# Patient Record
Sex: Female | Born: 1950 | Race: White | Hispanic: No | Marital: Married | Smoking: Never smoker
Health system: Southern US, Community
[De-identification: ages and names within clinical notes are randomized; demographics above are authoritative.]

## PROBLEM LIST (undated history)

## (undated) DIAGNOSIS — E119 Type 2 diabetes mellitus without complications: Secondary | ICD-10-CM

## (undated) DIAGNOSIS — N390 Urinary tract infection, site not specified: Secondary | ICD-10-CM

## (undated) DIAGNOSIS — Z9289 Personal history of other medical treatment: Secondary | ICD-10-CM

## (undated) DIAGNOSIS — I4891 Unspecified atrial fibrillation: Secondary | ICD-10-CM

## (undated) DIAGNOSIS — I509 Heart failure, unspecified: Secondary | ICD-10-CM

## (undated) DIAGNOSIS — I10 Essential (primary) hypertension: Secondary | ICD-10-CM

## (undated) DIAGNOSIS — M810 Age-related osteoporosis without current pathological fracture: Secondary | ICD-10-CM

## (undated) DIAGNOSIS — R32 Unspecified urinary incontinence: Secondary | ICD-10-CM

## (undated) DIAGNOSIS — Z993 Dependence on wheelchair: Secondary | ICD-10-CM

## (undated) DIAGNOSIS — F329 Major depressive disorder, single episode, unspecified: Secondary | ICD-10-CM

## (undated) DIAGNOSIS — F32A Depression, unspecified: Secondary | ICD-10-CM

## (undated) HISTORY — PX: WRIST FRACTURE SURGERY: SHX121

## (undated) HISTORY — DX: Urinary tract infection, site not specified: N39.0

## (undated) HISTORY — PX: CHOLECYSTECTOMY: SHX55

## (undated) HISTORY — PX: VEIN LIGATION AND STRIPPING: SHX2653

## (undated) HISTORY — PX: ABDOMINAL HYSTERECTOMY: SHX81

---
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
## 2007-09-14 ENCOUNTER — Emergency Department (HOSPITAL_COMMUNITY): Admission: EM | Admit: 2007-09-14 | Discharge: 2007-09-14 | Payer: Self-pay | Admitting: Emergency Medicine

## 2007-10-26 ENCOUNTER — Ambulatory Visit (HOSPITAL_COMMUNITY): Admission: RE | Admit: 2007-10-26 | Discharge: 2007-10-26 | Payer: Self-pay | Admitting: Orthopaedic Surgery

## 2007-11-09 ENCOUNTER — Ambulatory Visit (HOSPITAL_COMMUNITY): Admission: RE | Admit: 2007-11-09 | Discharge: 2007-11-11 | Payer: Self-pay | Admitting: Orthopaedic Surgery

## 2007-12-02 ENCOUNTER — Observation Stay (HOSPITAL_COMMUNITY): Admission: RE | Admit: 2007-12-02 | Discharge: 2007-12-03 | Payer: Self-pay | Admitting: Orthopaedic Surgery

## 2008-11-03 ENCOUNTER — Emergency Department (HOSPITAL_COMMUNITY): Admission: EM | Admit: 2008-11-03 | Discharge: 2008-11-03 | Payer: Self-pay | Admitting: Emergency Medicine

## 2008-11-04 ENCOUNTER — Emergency Department (HOSPITAL_COMMUNITY): Admission: EM | Admit: 2008-11-04 | Discharge: 2008-11-04 | Payer: Self-pay | Admitting: Family Medicine

## 2008-12-24 ENCOUNTER — Emergency Department (HOSPITAL_COMMUNITY): Admission: EM | Admit: 2008-12-24 | Discharge: 2008-12-24 | Payer: Self-pay | Admitting: Emergency Medicine

## 2008-12-25 ENCOUNTER — Encounter: Admission: RE | Admit: 2008-12-25 | Discharge: 2008-12-25 | Payer: Self-pay | Admitting: Orthopedic Surgery

## 2009-05-25 ENCOUNTER — Encounter: Admission: RE | Admit: 2009-05-25 | Discharge: 2009-05-25 | Payer: Self-pay | Admitting: Podiatry

## 2010-06-03 ENCOUNTER — Emergency Department (HOSPITAL_COMMUNITY): Admission: EM | Admit: 2010-06-03 | Discharge: 2010-06-04 | Payer: Self-pay | Admitting: Emergency Medicine

## 2010-06-06 ENCOUNTER — Ambulatory Visit (HOSPITAL_BASED_OUTPATIENT_CLINIC_OR_DEPARTMENT_OTHER): Admission: RE | Admit: 2010-06-06 | Discharge: 2010-06-06 | Payer: Self-pay | Admitting: Orthopedic Surgery

## 2010-10-07 ENCOUNTER — Other Ambulatory Visit: Payer: Self-pay | Admitting: Gastroenterology

## 2010-10-30 LAB — BASIC METABOLIC PANEL
CO2: 29 mEq/L (ref 19–32)
Calcium: 8.7 mg/dL (ref 8.4–10.5)
Creatinine, Ser: 0.8 mg/dL (ref 0.4–1.2)
GFR calc Af Amer: >60 mL/min (ref >60)
GFR calc non Af Amer: >60 mL/min (ref >60)
Glucose, Bld: 211 mg/dL — ABNORMAL HIGH (ref 70–99)
Sodium: 140 mEq/L (ref 135–145)

## 2010-11-26 LAB — CBC
HCT: 27.9 % — ABNORMAL LOW (ref 36.0–46.0)
Hemoglobin: 9.4 g/dL — ABNORMAL LOW (ref 12.0–15.0)
MCHC: 33.7 g/dL (ref 30.0–36.0)
MCV: 88.8 fL (ref 78.0–100.0)
Platelets: 252 10*3/uL (ref 150–400)
RBC: 3.14 MIL/uL — ABNORMAL LOW (ref 3.87–5.11)
RDW: 15.9 % — ABNORMAL HIGH (ref 11.5–15.5)
WBC: 9.6 K/uL (ref 4.0–10.5)

## 2010-11-26 LAB — BASIC METABOLIC PANEL WITH GFR
CO2: 28 meq/L (ref 19–32)
Calcium: 8.7 mg/dL (ref 8.4–10.5)
GFR calc Af Amer: >60 mL/min (ref >60)
GFR calc non Af Amer: 57 mL/min — ABNORMAL LOW (ref >60)
Sodium: 141 meq/L (ref 135–145)

## 2010-11-26 LAB — BASIC METABOLIC PANEL
BUN: 16 mg/dL (ref 6–23)
Chloride: 107 mEq/L (ref 96–112)
Creatinine, Ser: 1 mg/dL (ref 0.4–1.2)
Glucose, Bld: 267 mg/dL — ABNORMAL HIGH (ref 70–99)
Potassium: 3.9 mEq/L (ref 3.5–5.1)

## 2010-11-28 LAB — GLUCOSE, CAPILLARY: Glucose-Capillary: 136 mg/dL — ABNORMAL HIGH (ref 70–99)

## 2010-11-28 LAB — WOUND CULTURE

## 2010-12-31 NOTE — Op Note (Signed)
NAME:  [name], [name]               ACCOUNT NO.:  [id number]   MEDICAL RECORD NO.:  [id number]          PATIENT TYPE:  OBV   LOCATION:  T245                          FACILITY:  [facility]   PHYSICIAN:  J. Sanjuana Kava, M.D. DATE OF BIRTH:  04/06/1951   DATE OF PROCEDURE:  DATE OF DISCHARGE:                               OPERATIVE REPORT   PREOPERATIVE DIAGNOSIS:  Rotator cuff tear on the left.   POSTOPERATIVE DIAGNOSIS:  Rotator cuff tear on the left.   PROCEDURE:  1. Modified Neer acromioplasty.  2. Repair of rotator cuff, __________.   ANESTHESIA:  General.   TOURNIQUET:  No tourniquets.   DRAINS:  No drains.   SURGEON:  Iona Hansen, MD   INDICATION:  The patient dislocated her shoulder on September 14, 2007.  I  saw her in the emergency room and relocated her shoulder.  She has been  going to physical therapy, but has had decreased abduction.  MRI showed  a full-thickness nonretracted tear of the distal supraspinatus tendon  with a large amount of fluid.  There is no labral tear, no biceps  injury.  There is a Hill-Sachs Bankart lesion posterior.  She was  scheduled for surgery originally for March 26th or 27th.  She came to  the hospital, her blood sugars were markedly elevated, and surgery was  postponed.  She has since seen her family doctor on a strict diet, and  her blood sugars have now decreased.  Her A1c hemoglobin is decreased.  Her blood pressure has decreased.  There is a possibility that she may  eventually need to go on insulin.  This morning, her blood sugar was  102.  We told her previously that with elevated blood sugars, there is  increased risk for infection and increased risk for other significant  problems, especially with the general anesthetic.  She understands the  procedure.  The risks and imponderables have been discussed with the  patient on several occasions.  She is aware that she will need a course  of physical therapy.  She is aware that she  will be admitted overnight  for observation and pain control.   DESCRIPTION OF PROCEDURE:  The patient was seen in the holding area, and  the shoulder on the left was identified as the correct surgical site.  She placed a mark on the left shoulder.  I placed a mark on the left  shoulder.  She was brought to the operating room, given general  anesthesia while supine.  She was then placed in a semi-barber position  with free movement of the left shoulder.  She was prepped and draped in  the usual manner.  We did a time-out identifying Ms. Chargois as the patient  and the left shoulder as the correct surgical site.  A __________  incision was made from the coracoid to the [facility] joint area.  Careful  dissection and incision was made.  The deltoid was exposed.  The so-  called weak spot on the deltoid was opened, and the coracoacromial  ligament was identified.  The coracoacromial ligament was  then removed.  Modified Neer acromioplasty was carried out with a broad-base 1-inch  osteotome.  The area was then smoothed with a power rasp to give it a  smooth contour.  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  Attention was directed to the shoulder itself.  There was  no significant obvious tear that I had expected.  The cuff was opened,  and there were undersurface tears, but no marked full complete tear.  The biceps tendon was examined, and it has some slight fraying and this  was smoothed.  Rotator cuff was then repaired using #1 Bralon suture.  This was done in a figure-of-eight fashion with multiple sutures.  Good  repair was obtained.  We put the shoulder through a range of motion.  There was no evidence of any impingement.  The deltoid was closed using  2-0 chromic inverted figure-of-eight fashion.  Subcutaneous tissue was  reapproximated using 2-0 plain, and the skin was reapproximated using a  running subcuticular 3-0 nylon.  Steri-Strips applied.  Sterile dressing   and bulky dressing applied.  Cryo cuff applied.  The patient tolerated  the procedure well, going to recovery in good condition.  She will be  observed overnight for pain control with her PCA morphine IV pump.           ______________________________  J. Sanjuana Kava, M.D.     JWK/MEDQ  D:  12/02/2007  T:  12/03/2007  Job:  [id number]

## 2010-12-31 NOTE — H&P (Signed)
NAME:  [name], [name]               ACCOUNT NO.:  [id number]   MEDICAL RECORD NO.:  [id number]          PATIENT TYPE:  AMB   LOCATION:  DAY                           FACILITY:  [facility]   PHYSICIAN:  J. Sanjuana Kava, M.D. DATE OF BIRTH:  05/21/1951   DATE OF ADMISSION:  11/11/2007  DATE OF DISCHARGE:  LH                              HISTORY & PHYSICAL   CHIEF COMPLAINT:  My left shoulder has a torn rotator cuff.   The patient is a 60 year old female with pain and tenderness to her left  shoulder.  I first saw her in the emergency room on September 16, 2007 for  a dislocation of her shoulder on September 14, 2007.  I relocated her  shoulder in the ER.  I saw her back in the office on September 16, 2007,  and she started doing well after that, with very little pain, and so I  sent her to physical therapy, but she had markedly decreased abduction.  After a course of PT, I went to get an MRI of her shoulder on October 26, 2007, which showed a Hill-Sachs Bankart lesion post anterior  dislocation, with reduction of the head.  She had a full-thickness  nonretracted tear of the distal supraspinatus tendon, with a large  amount of fluid.  There was no labral tear, no biceps tendon injury.  I  informed her the findings.  She has not improved with therapy, and she  would like to proceed with a repair of the rotator cuff.  She only has a  few more weeks to be out of work from the [employer].  I have gone  over the risks and imponderables of the procedure.  She appears to  understand and agrees with the procedure as outlined.   The patient has a history of hypertension, diabetes, and circulatory  problems.  She denies heart disease, lung disease, kidney disease,  stroke, paralysis, weakness, cancer, polio, or ulcer disease.   She has no allergies.   She takes metformin 1000 mg b.i.d., fluoxetine 40 mg daily, Actos 30 mg  daily, glipizide 10 mg daily, and triamterene/HCTZ 0.5/25 daily.   She does  not smokes.  She does not use alcoholic beverages.  She goes to  [employer] at [facility].   She is status post tonsillectomy in 1968, appendectomy in 1969, pancreas  surgery for a large pseudocyst in 1969, a rectal fissure in 1972, vein  stripping, C-section in 1979, hysterectomy in 1981, cholecystectomy in  1998.   Diabetes and hypertension run in the family.   She lives in [place], and she is married.   PHYSICAL EXAMINATION:  VITAL SIGNS:  Stable.  GENERAL:  She is alert and oriented.  HEENT:  Negative.  NECK:  Supple.  LUNGS: Clear to P&A.  HEART:  Regular rate and rhythm, without murmur head.  ABDOMEN:  Soft, nontender, nondistended, obese, without masses.  EXTREMITIES:  Left shoulder has decreased range of motion.  She can  abduct to about 80 degrees before she has marked pain.  Forward flexion  is to 90 with  pain.  Internal rotation and external rotation are  decreased at 20.  Neurovascular is intact.  Other extremities are within  normal limits.  CNS: Intact.  SKIN:  Intact.   IMPRESSION:  Status post dislocation left shoulder, with rotator cuff  tear on the left of the supraspinatus tendon.   PLAN:  Open repair of rotator cuff.  I discussed with the patient the  planned procedure, risks, and imponderables.  She appears to understand  and agrees to the procedure as outlined.  Her labs are pending.                                            ______________________________  J. Sanjuana Kava, M.D.     JWK/MEDQ  D:  11/10/2007  T:  11/11/2007  Job:  [id number]

## 2010-12-31 NOTE — Consult Note (Signed)
NAME:  [name], [name]               ACCOUNT NO.:  [id number]   MEDICAL RECORD NO.:  [id number]          PATIENT TYPE:  EMS   LOCATION:  ED                            FACILITY:  [facility]   PHYSICIAN:  J. Sanjuana Kava, M.D. DATE OF BIRTH:  1951-08-13   DATE OF CONSULTATION:  DATE OF DISCHARGE:  09/14/2007                                 CONSULTATION   The patient is seen at the request of Dr. Roxanne Mins in the ER.  The patient  is a 60 year old female who fell at home tonight.  She had been up on  her treadmill, somehow got her feet mixed up, fell and landed on her  left side.  She dislocated her left shoulder.  There is no fracture.  No  head injury.  She was brought to the emergency room.  Conscious sedation  was tried earlier by the ER physician.  They were unsuccessful in the  shoulder being reduced, I was called.  Etomidate was again given by the  ER physician, as he has credentials to do this, and once the patient was  anesthetized I did a modified a Hippocratic maneuver and was able to  reduce the shoulder.  There was a loud audible click, pop.  The  patient's pain is much less now.  She has awakening from the anesthetic.  Neurovascularly, appears to be intact.   Anterior dislocation of the shoulder.   I am awaiting x-rays.  The patient given a prescription of Vicodin ES  for pain.  I need to see her in the office Thursday afternoon.  If any  difficulty, any problem, come back to the emergency room.  She is to  sleep semi-erect, should use her shoulder immobilize, and to use ice.  Again, return if any problems.           ______________________________  Lenna Sciara. Sanjuana Kava, M.D.     JWK/MEDQ  D:  09/14/2007  T:  09/15/2007  Job:  [id number]

## 2010-12-31 NOTE — H&P (Signed)
NAME:  [name], [name]               ACCOUNT NO.:  [id number]   MEDICAL RECORD NO.:  [id number]          PATIENT TYPE:  AMB   LOCATION:  DAY                           FACILITY:  [facility]   PHYSICIAN:  J. Sanjuana Kava, M.D. DATE OF BIRTH:  [birth year]   DATE OF ADMISSION:  DATE OF DISCHARGE:  LH                              HISTORY & PHYSICAL   ADDENDUM  This is an update to the history and physical that I originally dictated  on March 25.  She was originally scheduled for rotator cuff repair on  March 26. She came to the hospital and her blood sugars were markedly  elevated. Surgery was postponed pending her seeing her family doctor and  getting her blood sugars under better control. Since then, she has been  to the [place] at [facility] and has been seen by Talmage Nap, FNP. Her blood sugar and blood pressure have been monitored  closely and have now improved. Her A1c hemoglobin was 9.3 on March 26  and it decreased to 8.4 on April 13. Her blood sugars are well  controlled and her blood pressure is well controlled now. I have a note  from [place] saying the patient is now eligible to have surgery  and that her blood pressure and diabetes are under much better control.  The patient has been very conscientious in trying to get her blood  pressure and diabetes under better control and is following a very  strict diet. She understands eventually she may need to go on insulin.  She has had continued pain to the shoulder.   The rest of the exam, the rest of the physical findings and the rest of  the history and physical are unchanged. This is an addendum to the  previous history and physical.   PLAN:  Left rotator cuff tear open.   Again I have gone over the risks and imponderables of the procedure and  she appears to understand and agrees to them. Further labs are pending.  The patient will be observed overnight for pain control.                 ______________________________  J. Sanjuana Kava, M.D.     JWK/MEDQ  D:  11/29/2007  T:  11/29/2007  Job:  [id number]

## 2011-05-12 LAB — URINALYSIS, ROUTINE W REFLEX MICROSCOPIC
Specific Gravity, Urine: 1.015
Urobilinogen, UA: 0.2
pH: 6

## 2011-05-12 LAB — COMPREHENSIVE METABOLIC PANEL
Albumin: 3.6
Alkaline Phosphatase: 74
BUN: 20
CO2: 30
Chloride: 97
GFR calc non Af Amer: >60
Potassium: 4.1
Total Bilirubin: 0.6

## 2011-05-12 LAB — DIFFERENTIAL
Basophils Absolute: 0
Basophils Relative: 1
Eosinophils Relative: 4
Monocytes Absolute: 0.6
Neutro Abs: 5.8

## 2011-05-12 LAB — CBC
HCT: 32.8 — ABNORMAL LOW
Hemoglobin: 11.2 — ABNORMAL LOW
Platelets: 288
RBC: 4
WBC: 8.2

## 2011-05-12 LAB — URINE MICROSCOPIC-ADD ON

## 2011-05-12 LAB — HEMOGLOBIN A1C: Mean Plasma Glucose: 243

## 2011-05-13 LAB — DIFFERENTIAL
Basophils Absolute: 0.1
Basophils Relative: 1
Eosinophils Absolute: 0.2
Neutrophils Relative %: 70

## 2011-05-13 LAB — BASIC METABOLIC PANEL
BUN: 23
CO2: 24
CO2: 26
Calcium: 8.9
Chloride: 104
Creatinine, Ser: 0.8
Creatinine, Ser: 0.84
Glucose, Bld: 197 — ABNORMAL HIGH
Sodium: 135

## 2011-05-13 LAB — URINALYSIS, ROUTINE W REFLEX MICROSCOPIC
Protein, ur: 30 — AB
Urobilinogen, UA: 0.2

## 2011-05-13 LAB — CBC
MCHC: 33.5
MCV: 82.2
Platelets: 272

## 2011-05-13 LAB — URINE MICROSCOPIC-ADD ON

## 2013-07-28 ENCOUNTER — Emergency Department (HOSPITAL_COMMUNITY): Payer: BC Managed Care – PPO

## 2013-07-28 ENCOUNTER — Encounter (HOSPITAL_COMMUNITY): Payer: Self-pay | Admitting: Emergency Medicine

## 2013-07-28 ENCOUNTER — Emergency Department (HOSPITAL_COMMUNITY)
Admission: EM | Admit: 2013-07-28 | Discharge: 2013-07-28 | Disposition: A | Discharge status: Home / Self Care | Payer: BC Managed Care – PPO | Attending: Emergency Medicine | Admitting: Emergency Medicine

## 2013-07-28 DIAGNOSIS — Y939 Activity, unspecified: Secondary | ICD-10-CM | POA: Insufficient documentation

## 2013-07-28 DIAGNOSIS — E119 Type 2 diabetes mellitus without complications: Secondary | ICD-10-CM | POA: Insufficient documentation

## 2013-07-28 DIAGNOSIS — I10 Essential (primary) hypertension: Secondary | ICD-10-CM | POA: Insufficient documentation

## 2013-07-28 DIAGNOSIS — S6990XA Unspecified injury of unspecified wrist, hand and finger(s), initial encounter: Secondary | ICD-10-CM | POA: Insufficient documentation

## 2013-07-28 DIAGNOSIS — W19XXXA Unspecified fall, initial encounter: Secondary | ICD-10-CM

## 2013-07-28 DIAGNOSIS — S8000XA Contusion of unspecified knee, initial encounter: Secondary | ICD-10-CM | POA: Insufficient documentation

## 2013-07-28 DIAGNOSIS — S298XXA Other specified injuries of thorax, initial encounter: Secondary | ICD-10-CM | POA: Insufficient documentation

## 2013-07-28 DIAGNOSIS — W010XXA Fall on same level from slipping, tripping and stumbling without subsequent striking against object, initial encounter: Secondary | ICD-10-CM | POA: Insufficient documentation

## 2013-07-28 DIAGNOSIS — Y9289 Other specified places as the place of occurrence of the external cause: Secondary | ICD-10-CM | POA: Insufficient documentation

## 2013-07-28 DIAGNOSIS — S59909A Unspecified injury of unspecified elbow, initial encounter: Secondary | ICD-10-CM | POA: Insufficient documentation

## 2013-07-28 DIAGNOSIS — Z8659 Personal history of other mental and behavioral disorders: Secondary | ICD-10-CM | POA: Insufficient documentation

## 2013-07-28 HISTORY — DX: Type 2 diabetes mellitus without complications: E11.9

## 2013-07-28 HISTORY — DX: Essential (primary) hypertension: I10

## 2013-07-28 HISTORY — DX: Depression, unspecified: F32.A

## 2013-07-28 HISTORY — DX: Major depressive disorder, single episode, unspecified: F32.9

## 2013-07-28 MED ORDER — HYDROCODONE-ACETAMINOPHEN 5-325 MG PO TABS: 1.0000 | ORAL_TABLET | Freq: Four times a day (QID) | ORAL | Status: DC | PRN

## 2013-07-28 MED ORDER — IBUPROFEN 800 MG PO TABS
800.0000 mg | ORAL_TABLET | Freq: Once | ORAL | Status: AC
  Administered 2013-07-28: 800 mg via ORAL
  Filled 2013-07-28: qty 1

## 2013-07-28 NOTE — ED Notes (Signed)
Pt fell on Monday. Pt states pain to both knees with bruising. States large bruising to right breast area. Right rib pain. Pt states she fell on right side with arm extended out.

## 2013-07-28 NOTE — ED Provider Notes (Signed)
CSN: [id number]     Arrival date & time 07/28/13  1553 History   First MD Initiated Contact with Patient 07/28/13 1612     Chief Complaint  Patient presents with  . Fall   (Consider location/radiation/quality/duration/timing/severity/associated sxs/prior Treatment) HPI Comments: Tripped in [place] on R.  Patient is a 62 y.o. female presenting with fall. The history is provided by the patient.  Fall This is a recurrent problem. The current episode started more than 2 days ago. Episode frequency: once. The problem has been rapidly worsening. Pertinent negatives include no chest pain, no abdominal pain and no shortness of breath. Nothing aggravates the symptoms. Nothing relieves the symptoms.    Past Medical History  Diagnosis Date  . Diabetes mellitus without complication   . Hypertension   . Depression    Past Surgical History  Procedure Laterality Date  . Orthopedic surgery    . Abdominal hysterectomy    . Cesarean section    . Cholecystectomy    . Pancreas surgery     No family history on file. History  Substance Use Topics  . Smoking status: Never Smoker   . Smokeless tobacco: Not on file  . Alcohol Use: No   OB History   Grav Para Term Preterm Abortions TAB SAB Ect Mult Living                 Review of Systems  Constitutional: Negative for fever.  Respiratory: Negative for cough and shortness of breath.   Cardiovascular: Negative for chest pain and leg swelling.  Gastrointestinal: Negative for vomiting and abdominal pain.  Neurological: Negative for dizziness.  All other systems reviewed and are negative.    Allergies  Review of patient's allergies indicates no known allergies.  Home Medications  No current outpatient prescriptions on file. BP 159/85  Pulse 91  Temp(Src) 97.2 F (36.2 C) (Oral)  Resp 18  Ht 5' 5"  (1.651 m)  Wt 240 lb (108.863 kg)  BMI 39.94 kg/m2  SpO2 100% Physical Exam  Nursing note and vitals reviewed. Constitutional:  She is oriented to person, place, and time. She appears well-developed and well-nourished. No distress.  HENT:  Head: Normocephalic and atraumatic.  Eyes: EOM are normal. Pupils are equal, round, and reactive to light.  Neck: Normal range of motion. Neck supple.  Cardiovascular: Normal rate and regular rhythm.  Exam reveals no friction rub.   No murmur heard. Pulmonary/Chest: Effort normal and breath sounds normal. No respiratory distress. She has no wheezes. She has no rales. She exhibits tenderness (R sided, lateral).    Abdominal: Soft. She exhibits no distension. There is no tenderness. There is no rebound.  Musculoskeletal: Normal range of motion. She exhibits no edema.       Right knee: She exhibits ecchymosis (anterior). She exhibits normal range of motion, no swelling and no effusion. Tenderness (mild, patellar) found.       Left knee: She exhibits ecchymosis (anterior). She exhibits normal range of motion, no swelling and no effusion. Tenderness (mild, anterior) found.  Neurological: She is alert and oriented to person, place, and time.  Skin: No rash noted. She is not diaphoretic.    ED Course  Procedures (including critical care time) Labs Review Labs Reviewed - No data to display Imaging Review No results found.  EKG Interpretation   None       MDM   1. Fall, initial encounter    32F presents s/p fall. Complaining of bilateral knee pain, R rib  cage pain, R wrist pain. Not on anti-coagulants. No head injury or loss of consciousness. AFVSS here. R ribcage tenderness. Bilateral R knee bruising. R wrist with mild tenderness, no snuffbox tenderness. Full ROM of all joints. Lungs clear, however inspiration stops secondary to pain. No paradoxical motion.  Will CT chest and xray R wrist and both knees. Motrin given for pain.  Scans and xrays negative. Small amount of vicodin given for persistent pain. Stable for discharge.   Osvaldo Shipper, MD 07/28/13 2156

## 2013-08-02 MED FILL — Hydrocodone-Acetaminophen Tab 5-325 MG: ORAL | Qty: 6 | Status: AC

## 2013-09-30 ENCOUNTER — Other Ambulatory Visit: Payer: Self-pay | Admitting: Internal Medicine

## 2013-09-30 ENCOUNTER — Ambulatory Visit
Admission: RE | Admit: 2013-09-30 | Discharge: 2013-09-30 | Disposition: A | Discharge status: Home / Self Care | Payer: No Typology Code available for payment source | Source: Ambulatory Visit | Attending: Internal Medicine | Admitting: Internal Medicine

## 2013-09-30 DIAGNOSIS — S99921A Unspecified injury of right foot, initial encounter: Secondary | ICD-10-CM

## 2014-05-11 ENCOUNTER — Other Ambulatory Visit (HOSPITAL_COMMUNITY): Payer: Self-pay | Admitting: Respiratory Therapy

## 2014-05-11 DIAGNOSIS — J441 Chronic obstructive pulmonary disease with (acute) exacerbation: Secondary | ICD-10-CM

## 2014-06-22 ENCOUNTER — Encounter (HOSPITAL_COMMUNITY): Payer: BC Managed Care – PPO

## 2014-08-01 ENCOUNTER — Other Ambulatory Visit: Payer: Self-pay

## 2014-08-01 DIAGNOSIS — Z1231 Encounter for screening mammogram for malignant neoplasm of breast: Secondary | ICD-10-CM

## 2014-08-21 ENCOUNTER — Encounter (INDEPENDENT_AMBULATORY_CARE_PROVIDER_SITE_OTHER): Payer: Self-pay

## 2014-08-21 ENCOUNTER — Ambulatory Visit
Admission: RE | Admit: 2014-08-21 | Discharge: 2014-08-21 | Disposition: A | Discharge status: Home / Self Care | Payer: BLUE CROSS/BLUE SHIELD | Source: Ambulatory Visit

## 2014-08-21 DIAGNOSIS — Z1231 Encounter for screening mammogram for malignant neoplasm of breast: Secondary | ICD-10-CM

## 2014-08-23 ENCOUNTER — Other Ambulatory Visit: Payer: Self-pay | Admitting: Internal Medicine

## 2014-08-23 DIAGNOSIS — R928 Other abnormal and inconclusive findings on diagnostic imaging of breast: Secondary | ICD-10-CM

## 2014-09-07 ENCOUNTER — Ambulatory Visit
Admission: RE | Admit: 2014-09-07 | Discharge: 2014-09-07 | Disposition: A | Discharge status: Home / Self Care | Payer: BLUE CROSS/BLUE SHIELD | Source: Ambulatory Visit | Attending: Internal Medicine | Admitting: Internal Medicine

## 2014-09-07 DIAGNOSIS — R928 Other abnormal and inconclusive findings on diagnostic imaging of breast: Secondary | ICD-10-CM

## 2014-11-03 ENCOUNTER — Other Ambulatory Visit: Payer: Self-pay | Admitting: Internal Medicine

## 2014-11-03 DIAGNOSIS — R51 Headache: Principal | ICD-10-CM

## 2014-11-03 DIAGNOSIS — R519 Headache, unspecified: Secondary | ICD-10-CM

## 2014-11-07 ENCOUNTER — Ambulatory Visit
Admission: RE | Admit: 2014-11-07 | Discharge: 2014-11-07 | Disposition: A | Discharge status: Home / Self Care | Payer: BLUE CROSS/BLUE SHIELD | Source: Ambulatory Visit | Attending: Internal Medicine | Admitting: Internal Medicine

## 2014-11-07 DIAGNOSIS — R51 Headache: Principal | ICD-10-CM

## 2014-11-07 DIAGNOSIS — R519 Headache, unspecified: Secondary | ICD-10-CM

## 2015-11-20 DIAGNOSIS — J209 Acute bronchitis, unspecified: Secondary | ICD-10-CM | POA: Diagnosis not present

## 2016-01-04 DIAGNOSIS — M533 Sacrococcygeal disorders, not elsewhere classified: Secondary | ICD-10-CM | POA: Diagnosis not present

## 2016-01-04 DIAGNOSIS — M546 Pain in thoracic spine: Secondary | ICD-10-CM | POA: Diagnosis not present

## 2016-01-04 DIAGNOSIS — M542 Cervicalgia: Secondary | ICD-10-CM | POA: Diagnosis not present

## 2016-01-04 DIAGNOSIS — M25551 Pain in right hip: Secondary | ICD-10-CM | POA: Diagnosis not present

## 2016-01-04 DIAGNOSIS — M545 Low back pain: Secondary | ICD-10-CM | POA: Diagnosis not present

## 2016-01-17 DIAGNOSIS — N390 Urinary tract infection, site not specified: Secondary | ICD-10-CM

## 2016-01-17 HISTORY — DX: Urinary tract infection, site not specified: N39.0

## 2016-01-31 DIAGNOSIS — R102 Pelvic and perineal pain: Secondary | ICD-10-CM | POA: Diagnosis not present

## 2016-01-31 DIAGNOSIS — R32 Unspecified urinary incontinence: Secondary | ICD-10-CM | POA: Diagnosis not present

## 2016-01-31 DIAGNOSIS — N760 Acute vaginitis: Secondary | ICD-10-CM | POA: Diagnosis not present

## 2016-02-19 ENCOUNTER — Emergency Department (HOSPITAL_COMMUNITY): Payer: Medicare Other

## 2016-02-19 ENCOUNTER — Encounter (HOSPITAL_COMMUNITY): Payer: Self-pay | Admitting: *Deleted

## 2016-02-19 ENCOUNTER — Inpatient Hospital Stay (HOSPITAL_COMMUNITY)
Admission: EM | Admit: 2016-02-19 | Discharge: 2016-02-22 | DRG: 481 | Disposition: A | Discharge status: Skilled Nursing Facility | Payer: Medicare Other | Attending: Internal Medicine | Admitting: Internal Medicine

## 2016-02-19 DIAGNOSIS — S72409A Unspecified fracture of lower end of unspecified femur, initial encounter for closed fracture: Secondary | ICD-10-CM | POA: Diagnosis present

## 2016-02-19 DIAGNOSIS — IMO0002 Reserved for concepts with insufficient information to code with codable children: Secondary | ICD-10-CM | POA: Diagnosis present

## 2016-02-19 DIAGNOSIS — S72402D Unspecified fracture of lower end of left femur, subsequent encounter for closed fracture with routine healing: Secondary | ICD-10-CM | POA: Diagnosis not present

## 2016-02-19 DIAGNOSIS — S92414A Nondisplaced fracture of proximal phalanx of right great toe, initial encounter for closed fracture: Secondary | ICD-10-CM | POA: Diagnosis not present

## 2016-02-19 DIAGNOSIS — S92515A Nondisplaced fracture of proximal phalanx of left lesser toe(s), initial encounter for closed fracture: Secondary | ICD-10-CM | POA: Diagnosis not present

## 2016-02-19 DIAGNOSIS — E089 Diabetes mellitus due to underlying condition without complications: Secondary | ICD-10-CM | POA: Diagnosis present

## 2016-02-19 DIAGNOSIS — M6281 Muscle weakness (generalized): Secondary | ICD-10-CM | POA: Diagnosis not present

## 2016-02-19 DIAGNOSIS — T383X5A Adverse effect of insulin and oral hypoglycemic [antidiabetic] drugs, initial encounter: Secondary | ICD-10-CM | POA: Diagnosis not present

## 2016-02-19 DIAGNOSIS — S72402A Unspecified fracture of lower end of left femur, initial encounter for closed fracture: Secondary | ICD-10-CM | POA: Diagnosis not present

## 2016-02-19 DIAGNOSIS — M80072A Age-related osteoporosis with current pathological fracture, left ankle and foot, initial encounter for fracture: Secondary | ICD-10-CM | POA: Diagnosis present

## 2016-02-19 DIAGNOSIS — I10 Essential (primary) hypertension: Secondary | ICD-10-CM | POA: Diagnosis present

## 2016-02-19 DIAGNOSIS — S7292XA Unspecified fracture of left femur, initial encounter for closed fracture: Secondary | ICD-10-CM | POA: Diagnosis present

## 2016-02-19 DIAGNOSIS — M810 Age-related osteoporosis without current pathological fracture: Secondary | ICD-10-CM | POA: Diagnosis not present

## 2016-02-19 DIAGNOSIS — Z7984 Long term (current) use of oral hypoglycemic drugs: Secondary | ICD-10-CM

## 2016-02-19 DIAGNOSIS — S92401A Displaced unspecified fracture of right great toe, initial encounter for closed fracture: Secondary | ICD-10-CM

## 2016-02-19 DIAGNOSIS — S41109A Unspecified open wound of unspecified upper arm, initial encounter: Secondary | ICD-10-CM | POA: Diagnosis not present

## 2016-02-19 DIAGNOSIS — Z66 Do not resuscitate: Secondary | ICD-10-CM | POA: Diagnosis not present

## 2016-02-19 DIAGNOSIS — F329 Major depressive disorder, single episode, unspecified: Secondary | ICD-10-CM | POA: Diagnosis present

## 2016-02-19 DIAGNOSIS — Z9181 History of falling: Secondary | ICD-10-CM | POA: Diagnosis not present

## 2016-02-19 DIAGNOSIS — S92412A Displaced fracture of proximal phalanx of left great toe, initial encounter for closed fracture: Secondary | ICD-10-CM | POA: Diagnosis not present

## 2016-02-19 DIAGNOSIS — B961 Klebsiella pneumoniae [K. pneumoniae] as the cause of diseases classified elsewhere: Secondary | ICD-10-CM | POA: Diagnosis not present

## 2016-02-19 DIAGNOSIS — T464X5A Adverse effect of angiotensin-converting-enzyme inhibitors, initial encounter: Secondary | ICD-10-CM | POA: Diagnosis not present

## 2016-02-19 DIAGNOSIS — Z8262 Family history of osteoporosis: Secondary | ICD-10-CM | POA: Diagnosis not present

## 2016-02-19 DIAGNOSIS — Z4789 Encounter for other orthopedic aftercare: Secondary | ICD-10-CM | POA: Diagnosis not present

## 2016-02-19 DIAGNOSIS — W19XXXA Unspecified fall, initial encounter: Secondary | ICD-10-CM

## 2016-02-19 DIAGNOSIS — R079 Chest pain, unspecified: Secondary | ICD-10-CM | POA: Diagnosis not present

## 2016-02-19 DIAGNOSIS — M79662 Pain in left lower leg: Secondary | ICD-10-CM | POA: Diagnosis not present

## 2016-02-19 DIAGNOSIS — S72492A Other fracture of lower end of left femur, initial encounter for closed fracture: Secondary | ICD-10-CM | POA: Diagnosis not present

## 2016-02-19 DIAGNOSIS — K869 Disease of pancreas, unspecified: Secondary | ICD-10-CM | POA: Diagnosis present

## 2016-02-19 DIAGNOSIS — M25562 Pain in left knee: Secondary | ICD-10-CM | POA: Diagnosis not present

## 2016-02-19 DIAGNOSIS — T148XXA Other injury of unspecified body region, initial encounter: Secondary | ICD-10-CM

## 2016-02-19 DIAGNOSIS — N179 Acute kidney failure, unspecified: Secondary | ICD-10-CM | POA: Diagnosis not present

## 2016-02-19 DIAGNOSIS — S92405D Nondisplaced unspecified fracture of left great toe, subsequent encounter for fracture with routine healing: Secondary | ICD-10-CM | POA: Diagnosis not present

## 2016-02-19 DIAGNOSIS — S299XXA Unspecified injury of thorax, initial encounter: Secondary | ICD-10-CM | POA: Diagnosis not present

## 2016-02-19 DIAGNOSIS — S42412D Displaced simple supracondylar fracture without intercondylar fracture of left humerus, subsequent encounter for fracture with routine healing: Secondary | ICD-10-CM | POA: Diagnosis not present

## 2016-02-19 DIAGNOSIS — M80852A Other osteoporosis with current pathological fracture, left femur, initial encounter for fracture: Secondary | ICD-10-CM | POA: Diagnosis not present

## 2016-02-19 DIAGNOSIS — W010XXA Fall on same level from slipping, tripping and stumbling without subsequent striking against object, initial encounter: Secondary | ICD-10-CM | POA: Diagnosis present

## 2016-02-19 DIAGNOSIS — M79632 Pain in left forearm: Secondary | ICD-10-CM | POA: Diagnosis not present

## 2016-02-19 DIAGNOSIS — R279 Unspecified lack of coordination: Secondary | ICD-10-CM | POA: Diagnosis not present

## 2016-02-19 DIAGNOSIS — M79605 Pain in left leg: Secondary | ICD-10-CM | POA: Diagnosis not present

## 2016-02-19 DIAGNOSIS — Z419 Encounter for procedure for purposes other than remedying health state, unspecified: Secondary | ICD-10-CM

## 2016-02-19 DIAGNOSIS — D62 Acute posthemorrhagic anemia: Secondary | ICD-10-CM | POA: Diagnosis not present

## 2016-02-19 DIAGNOSIS — M25552 Pain in left hip: Secondary | ICD-10-CM | POA: Diagnosis not present

## 2016-02-19 DIAGNOSIS — F32A Depression, unspecified: Secondary | ICD-10-CM | POA: Diagnosis present

## 2016-02-19 DIAGNOSIS — S72462A Displaced supracondylar fracture with intracondylar extension of lower end of left femur, initial encounter for closed fracture: Secondary | ICD-10-CM | POA: Diagnosis not present

## 2016-02-19 DIAGNOSIS — S92404D Nondisplaced unspecified fracture of right great toe, subsequent encounter for fracture with routine healing: Secondary | ICD-10-CM | POA: Diagnosis not present

## 2016-02-19 DIAGNOSIS — M80071A Age-related osteoporosis with current pathological fracture, right ankle and foot, initial encounter for fracture: Secondary | ICD-10-CM | POA: Diagnosis present

## 2016-02-19 DIAGNOSIS — R293 Abnormal posture: Secondary | ICD-10-CM | POA: Diagnosis not present

## 2016-02-19 DIAGNOSIS — E119 Type 2 diabetes mellitus without complications: Secondary | ICD-10-CM | POA: Diagnosis not present

## 2016-02-19 DIAGNOSIS — S40021A Contusion of right upper arm, initial encounter: Secondary | ICD-10-CM | POA: Diagnosis not present

## 2016-02-19 DIAGNOSIS — M79602 Pain in left arm: Secondary | ICD-10-CM | POA: Diagnosis not present

## 2016-02-19 DIAGNOSIS — S72452A Displaced supracondylar fracture without intracondylar extension of lower end of left femur, initial encounter for closed fracture: Secondary | ICD-10-CM | POA: Diagnosis not present

## 2016-02-19 DIAGNOSIS — S92411A Displaced fracture of proximal phalanx of right great toe, initial encounter for closed fracture: Secondary | ICD-10-CM | POA: Diagnosis not present

## 2016-02-19 DIAGNOSIS — E1165 Type 2 diabetes mellitus with hyperglycemia: Secondary | ICD-10-CM | POA: Diagnosis present

## 2016-02-19 DIAGNOSIS — N390 Urinary tract infection, site not specified: Secondary | ICD-10-CM | POA: Diagnosis present

## 2016-02-19 DIAGNOSIS — R52 Pain, unspecified: Secondary | ICD-10-CM | POA: Diagnosis not present

## 2016-02-19 DIAGNOSIS — S92402A Displaced unspecified fracture of left great toe, initial encounter for closed fracture: Secondary | ICD-10-CM

## 2016-02-19 HISTORY — DX: Age-related osteoporosis without current pathological fracture: M81.0

## 2016-02-19 LAB — CBC WITH DIFFERENTIAL/PLATELET
BASOS PCT: 0 %
Basophils Absolute: 0 10*3/uL (ref 0.0–0.1)
Eosinophils Absolute: 0 10*3/uL (ref 0.0–0.7)
Eosinophils Relative: 0 %
HCT: 34.5 % — ABNORMAL LOW (ref 36.0–46.0)
HEMOGLOBIN: 10.7 g/dL — AB (ref 12.0–15.0)
Lymphocytes Relative: 5 %
Lymphs Abs: 0.8 10*3/uL (ref 0.7–4.0)
MCH: 28.7 pg (ref 26.0–34.0)
MCHC: 31 g/dL (ref 30.0–36.0)
MCV: 92.5 fL (ref 78.0–100.0)
Monocytes Absolute: 0.4 10*3/uL (ref 0.1–1.0)
Monocytes Relative: 3 %
NEUTROS PCT: 92 %
Neutro Abs: 13.9 10*3/uL — ABNORMAL HIGH (ref 1.7–7.7)
Platelets: 289 10*3/uL (ref 150–400)
RBC: 3.73 MIL/uL — AB (ref 3.87–5.11)
RDW: 13.8 % (ref 11.5–15.5)
WBC: 15.1 10*3/uL — AB (ref 4.0–10.5)

## 2016-02-19 LAB — BASIC METABOLIC PANEL
ANION GAP: 12 (ref 5–15)
BUN: 25 mg/dL — ABNORMAL HIGH (ref 6–20)
CALCIUM: 9.1 mg/dL (ref 8.9–10.3)
CHLORIDE: 104 mmol/L (ref 101–111)
CO2: 22 mmol/L (ref 22–32)
CREATININE: 0.85 mg/dL (ref 0.44–1.00)
GFR calc non Af Amer: >60 mL/min (ref >60)
Glucose, Bld: 270 mg/dL — ABNORMAL HIGH (ref 65–99)
Potassium: 4.5 mmol/L (ref 3.5–5.1)
SODIUM: 138 mmol/L (ref 135–145)

## 2016-02-19 LAB — I-STAT CHEM 8, ED
BUN: 28 mg/dL — AB (ref 6–20)
CHLORIDE: 104 mmol/L (ref 101–111)
Calcium, Ion: 1.06 mmol/L — ABNORMAL LOW (ref 1.12–1.23)
Creatinine, Ser: 0.8 mg/dL (ref 0.44–1.00)
Glucose, Bld: 280 mg/dL — ABNORMAL HIGH (ref 65–99)
HEMATOCRIT: 35 % — AB (ref 36.0–46.0)
Hemoglobin: 11.9 g/dL — ABNORMAL LOW (ref 12.0–15.0)
POTASSIUM: 4.5 mmol/L (ref 3.5–5.1)
SODIUM: 137 mmol/L (ref 135–145)
TCO2: 24 mmol/L (ref 0–100)

## 2016-02-19 LAB — URINALYSIS, ROUTINE W REFLEX MICROSCOPIC
BILIRUBIN URINE: NEGATIVE
Glucose, UA: 500 mg/dL — AB
Ketones, ur: 15 mg/dL — AB
NITRITE: NEGATIVE
Protein, ur: 100 mg/dL — AB
SPECIFIC GRAVITY, URINE: 1.018 (ref 1.005–1.030)
pH: 5.5 (ref 5.0–8.0)

## 2016-02-19 LAB — URINE MICROSCOPIC-ADD ON

## 2016-02-19 LAB — T4, FREE: FREE T4: 1 ng/dL (ref 0.61–1.12)

## 2016-02-19 LAB — GLUCOSE, CAPILLARY: Glucose-Capillary: 155 mg/dL — ABNORMAL HIGH (ref 65–99)

## 2016-02-19 LAB — SURGICAL PCR SCREEN
MRSA, PCR: NEGATIVE
Staphylococcus aureus: POSITIVE — AB

## 2016-02-19 LAB — TSH: TSH: 1.496 u[IU]/mL (ref 0.350–4.500)

## 2016-02-19 LAB — CBG MONITORING, ED: Glucose-Capillary: 277 mg/dL — ABNORMAL HIGH (ref 65–99)

## 2016-02-19 MED ORDER — BUPROPION HCL ER (XL) 150 MG PO TB24
300.0000 mg | ORAL_TABLET | Freq: Every day | ORAL | Status: DC
  Administered 2016-02-20 – 2016-02-22 (×3): 300 mg via ORAL
  Filled 2016-02-19 (×3): qty 2

## 2016-02-19 MED ORDER — ONDANSETRON HCL 4 MG/2ML IJ SOLN
4.0000 mg | Freq: Once | INTRAMUSCULAR | Status: AC
  Administered 2016-02-19: 4 mg via INTRAVENOUS
  Filled 2016-02-19: qty 2

## 2016-02-19 MED ORDER — BUPROPION HCL ER (XL) 150 MG PO TB24: 300.0000 mg | ORAL_TABLET | Freq: Every day | ORAL | Status: DC

## 2016-02-19 MED ORDER — POVIDONE-IODINE 10 % EX SWAB
2.0000 "application " | Freq: Once | CUTANEOUS | Status: AC
  Administered 2016-02-20: 2 via TOPICAL

## 2016-02-19 MED ORDER — HYDROMORPHONE HCL 1 MG/ML IJ SOLN
1.0000 mg | INTRAMUSCULAR | Status: DC | PRN
Start: 2016-02-19 — End: 2016-02-22
  Administered 2016-02-19 – 2016-02-21 (×8): 1 mg via INTRAVENOUS
  Filled 2016-02-19 (×8): qty 1

## 2016-02-19 MED ORDER — VANCOMYCIN HCL 10 G IV SOLR
1500.0000 mg | INTRAVENOUS | Status: AC
  Administered 2016-02-20: 1500 mg via INTRAVENOUS
  Filled 2016-02-19: qty 1500

## 2016-02-19 MED ORDER — SODIUM CHLORIDE 0.9 % IV SOLN: INTRAVENOUS | Status: DC

## 2016-02-19 MED ORDER — SODIUM CHLORIDE 0.9 % IV SOLN
INTRAVENOUS | Status: AC
  Administered 2016-02-19 (×2): via INTRAVENOUS

## 2016-02-19 MED ORDER — ACETAMINOPHEN 325 MG PO TABS
650.0000 mg | ORAL_TABLET | Freq: Four times a day (QID) | ORAL | Status: DC | PRN
Start: 2016-02-19 — End: 2016-02-20

## 2016-02-19 MED ORDER — HYDROMORPHONE HCL 1 MG/ML IJ SOLN
1.0000 mg | Freq: Once | INTRAMUSCULAR | Status: AC
  Administered 2016-02-19: 1 mg via INTRAVENOUS
  Filled 2016-02-19: qty 1

## 2016-02-19 MED ORDER — ONDANSETRON HCL 4 MG/2ML IJ SOLN
4.0000 mg | Freq: Four times a day (QID) | INTRAMUSCULAR | Status: DC | PRN
  Administered 2016-02-20 (×2): 4 mg via INTRAVENOUS
  Filled 2016-02-19 (×2): qty 2

## 2016-02-19 MED ORDER — FLUOXETINE HCL 20 MG PO CAPS
40.0000 mg | ORAL_CAPSULE | Freq: Every day | ORAL | Status: DC
  Administered 2016-02-20 – 2016-02-22 (×3): 40 mg via ORAL
  Filled 2016-02-19 (×3): qty 2

## 2016-02-19 MED ORDER — HYDRALAZINE HCL 20 MG/ML IJ SOLN: 10.0000 mg | Freq: Four times a day (QID) | INTRAMUSCULAR | Status: DC | PRN

## 2016-02-19 MED ORDER — CLONAZEPAM 1 MG PO TABS
1.0000 mg | ORAL_TABLET | Freq: Every day | ORAL | Status: DC
  Administered 2016-02-19 – 2016-02-21 (×3): 1 mg via ORAL
  Filled 2016-02-19 (×3): qty 1

## 2016-02-19 MED ORDER — INSULIN ASPART 100 UNIT/ML ~~LOC~~ SOLN
0.0000 [IU] | SUBCUTANEOUS | Status: DC
  Administered 2016-02-19: 5 [IU] via SUBCUTANEOUS
  Administered 2016-02-19: 2 [IU] via SUBCUTANEOUS
  Administered 2016-02-20: 3 [IU] via SUBCUTANEOUS
  Administered 2016-02-20: 1 [IU] via SUBCUTANEOUS
  Administered 2016-02-20: 3 [IU] via SUBCUTANEOUS
  Filled 2016-02-19: qty 1

## 2016-02-19 NOTE — H&P (Signed)
History and Physical    [name] JGO:[id number] DOB: 1951/03/03 DOA: 02/19/2016  Referring MD/NP/PA: EDP Dr.Little PCP: Myrtis Ser, MD   Chief Complaint: fall, knee pain  History of Present Illness: [name] is an 65 y.o. female with past medical history of diabetes, hypertension, osteoporosis, depression presents to the ER after a mechanical fall. Patient reports that she was walking back after emptying her trash and subsequently tripped since she is not paying attention and landed on the floor. She denies any dizziness, loss of consciousness, chest pain palpitations etc. Subsequently had severe pain in her left thigh, knee and that the great toes in both her feet. She has a history of balance problems and falls relatively frequently. In the emergency room she was noted to have displaced/comminuted fracture of distal left femur and fractures involving the great toes of both her feet.  Review of Systems:  As per HPI , all other systems reviewed and are negative   Past Medical History  Diagnosis Date  . Diabetes mellitus without complication ([place])   . Hypertension   . Depression   . Osteoporosis     Past Surgical History  Procedure Laterality Date  . Orthopedic surgery    . Abdominal hysterectomy    . Cesarean section    . Cholecystectomy    . Pancreas surgery     Social history: lives at home with spouse and grand daughter  reports that she has never smoked. She does not have any smokeless tobacco history on file. She reports that she does not drink alcohol. Her drug history is not on file.  No Known Allergies  Family History: -h/o osteoporosis in her mother  Prior to Admission medications   Medication Sig Start Date End Date Taking? Authorizing Provider  acetaminophen (TYLENOL) 325 MG tablet Take 650 mg by mouth every 6 (six) hours as needed for mild pain.   Yes Historical Provider, MD  buPROPion (WELLBUTRIN XL) 300 MG 24 hr tablet Take 300 mg by mouth  daily. 07/06/13  Yes Historical Provider, MD  clonazePAM (KLONOPIN) 1 MG tablet Take 1 mg by mouth at bedtime. 07/09/13  Yes Historical Provider, MD  FLUoxetine (PROZAC) 20 MG capsule Take 40 mg by mouth daily.  06/27/13  Yes Historical Provider, MD  glipiZIDE (GLUCOTROL) 10 MG tablet Take 10 mg by mouth 2 (two) times daily. 05/24/13  Yes Historical Provider, MD  lisinopril (PRINIVIL,ZESTRIL) 20 MG tablet Take 20 mg by mouth daily. 06/09/13  Yes Historical Provider, MD  meloxicam (MOBIC) 7.5 MG tablet Take 7.5 mg by mouth daily as needed for pain.  05/05/13  Yes Historical Provider, MD  metFORMIN (GLUCOPHAGE) 1000 MG tablet Take 1,000 mg by mouth 2 (two) times daily. 05/05/13  Yes Historical Provider, MD  pioglitazone (ACTOS) 45 MG tablet Take 45 mg by mouth daily. 05/24/13  Yes Historical Provider, MD  Vitamin D, Ergocalciferol, (DRISDOL) 50000 units CAPS capsule Take 50,000 Units by mouth every 7 (seven) days.   Yes Historical Provider, MD  HYDROcodone-acetaminophen (NORCO/VICODIN) 5-325 MG per tablet Take 1 tablet by mouth every 6 (six) hours as needed for moderate pain. Patient not taking: Reported on 02/19/2016 07/28/13   Evelina Bucy, MD    Physical Exam: Filed Vitals:   02/19/16 1900 02/19/16 1930 02/19/16 2000 02/19/16 2100  BP: 143/60 134/55 151/58 168/65  Pulse: 103 100 100 98  Temp:    98.5 F (36.9 C)  TempSrc:    Oral  Resp:    18  SpO2: 92% 89% 95% 96%      Constitutional: NAD, calm, comfortable Filed Vitals:   02/19/16 1900 02/19/16 1930 02/19/16 2000 02/19/16 2100  BP: 143/60 134/55 151/58 168/65  Pulse: 103 100 100 98  Temp:    98.5 F (36.9 C)  TempSrc:    Oral  Resp:    18  SpO2: 92% 89% 95% 96%   Constitutional: Alert and awake, oriented x3, Uncomfortable appearing due to pain Eyes: PERLA, EOMI, irises appear normal, anicteric sclera,  ENMT: Pupils small but reactive, oral mucosa moist, poor dental hygiene   Neck: neck appears normal, no masses, normal  ROM, no thyromegaly, no JVD  CVS: S1-S2 clear, no murmur rubs or gallops, no LE edema, normal pedal pulses  Respiratory: clear to auscultation bilaterally, no wheezing, rales or rhonchi. Respiratory effort normal. No accessory muscle use.  Abdomen: soft nontender, nondistended, normal bowel sounds, no hepatosplenomegaly, no hernias  Musculoskeletal: : no cyanosis, clubbing or edema noted bilaterally  left knee immobilized, swelling and discoloration of both great toes Neuro: Cranial nerves II-XII intact, strength, sensation, reflexes Psych: judgement and insight appear normal, stable mood and affect, mental status Skin: no rashes or lesions or ulcers, no induration or nodules   Labs on Admission: I have personally reviewed following labs and imaging studies  CBC:  Recent Labs Lab 02/19/16 1514 02/19/16 1534  WBC 15.1*  --   NEUTROABS 13.9*  --   HGB 10.7* 11.9*  HCT 34.5* 35.0*  MCV 92.5  --   PLT 289  --    Basic Metabolic Panel:  Recent Labs Lab 02/19/16 1514 02/19/16 1534  NA 138 137  K 4.5 4.5  CL 104 104  CO2 22  --   GLUCOSE 270* 280*  BUN 25* 28*  CREATININE 0.85 0.80  CALCIUM 9.1  --    GFR: CrCl cannot be calculated (Unknown ideal weight.). Liver Function Tests: No results for input(s): AST, ALT, ALKPHOS, BILITOT, PROT, ALBUMIN in the last 168 hours. No results for input(s): LIPASE, AMYLASE in the last 168 hours. No results for input(s): AMMONIA in the last 168 hours. Coagulation Profile: No results for input(s): INR, PROTIME in the last 168 hours. Cardiac Enzymes: No results for input(s): CKTOTAL, CKMB, CKMBINDEX, TROPONINI in the last 168 hours. BNP (last 3 results) No results for input(s): PROBNP in the last 8760 hours. HbA1C: No results for input(s): HGBA1C in the last 72 hours. CBG:  Recent Labs Lab 02/19/16 1753  GLUCAP 277*   Lipid Profile: No results for input(s): CHOL, HDL, LDLCALC, TRIG, CHOLHDL, LDLDIRECT in  the last 72 hours. Thyroid Function Tests:  Recent Labs  02/19/16 1824  TSH 1.496  FREET4 1.00   Anemia Panel: No results for input(s): VITAMINB12, FOLATE, FERRITIN, TIBC, IRON, RETICCTPCT in the last 72 hours. Urine analysis:    Component Value Date/Time   COLORURINE YELLOW 12/01/2007 1025   APPEARANCEUR CLOUDY* 12/01/2007 1025   LABSPEC >1.030* 12/01/2007 1025   PHURINE 5.5 12/01/2007 1025   GLUCOSEU NEGATIVE 12/01/2007 1025   HGBUR LARGE* 12/01/2007 1025   BILIRUBINUR NEGATIVE 12/01/2007 1025   KETONESUR TRACE* 12/01/2007 1025   PROTEINUR 30* 12/01/2007 1025   UROBILINOGEN 0.2 12/01/2007 1025   NITRITE NEGATIVE 12/01/2007 1025   LEUKOCYTESUR MODERATE* 12/01/2007 1025   Sepsis Labs: @LABRCNTIP (procalcitonin:4,lacticidven:4) )No results found for this or any previous visit (from the past 240 hour(s)).   Radiological Exams on Admission: Dg Chest 2 View  02/19/2016  CLINICAL DATA:  Pain following fall.  Hypertension. EXAM: CHEST  2 VIEW COMPARISON:  Chest radiograph April 09, 2011 and chest CT July 28, 2013 FINDINGS: There is no edema or consolidation. Heart is upper normal in size with pulmonary vascularity within normal limits. There is atherosclerotic calcification in the aorta. There is mitral annulus calcification. There is no demonstrable adenopathy. There is evidence of old trauma with remodeling involving the proximal left humerus. There old healed rib fractures on the right. IMPRESSION: No edema or consolidation.  There is aortic atherosclerosis. Electronically Signed   By: Lowella Grip III M.D.   On: 02/19/2016 15:55   Dg Pelvis 1-2 Views  02/19/2016  CLINICAL DATA:  Patient fell today from standing position. Pt c/o severe left leg pain, left generalized arm pain, and right toe pain. EXAM: PELVIS - 1-2 VIEW COMPARISON:  None. FINDINGS: No fracture.  No bone lesion.  Bones are demineralized. Hip joints, SI joints and symphysis pubis are normally aligned.  IMPRESSION: No fracture or dislocation. Electronically Signed   By: Lajean Manes M.D.   On: 02/19/2016 14:40   Dg Forearm Left  02/19/2016  CLINICAL DATA:  Patient fell today from standing position. Pt c/o severe left leg pain, left generalized arm pain, and right toe pain. EXAM: LEFT FOREARM - 2 VIEW COMPARISON:  None. FINDINGS: No fracture.  No dislocation.  Bones are demineralized. There is soft tissue swelling along the radial aspect of the distal forearm. No radiopaque foreign body. IMPRESSION: No fracture or dislocation. Electronically Signed   By: Lajean Manes M.D.   On: 02/19/2016 14:37   Dg Tibia/fibula Left  02/19/2016  CLINICAL DATA:  Patient fell today from standing position. Pt c/o severe left leg pain, left generalized arm pain, and right toe pain. EXAM: LEFT TIBIA AND FIBULA - 2 VIEW COMPARISON:  None. FINDINGS: No fracture.  No bone lesion. There are advanced degenerative changes of the knee with marked medial compartment narrowing. Ankle joint is normally spaced and aligned. C/o Bones are demineralized. There are soft tissue and vascular calcifications along the left leg with multiple vascular clips from previous the umbilical vein surgery. Small IMPRESSION: 1. No acute fracture or dislocation. Electronically Signed   By: Lajean Manes M.D.   On: 02/19/2016 14:39   Ct Knee Left Wo Contrast  02/19/2016  CLINICAL DATA:  Fall from standing position. Severe pain to left knee, toes on left and right feet. EXAM: CT OF THE LEFT KNEE WITHOUT CONTRAST TECHNIQUE: Multidetector CT imaging of the LEFT knee was performed according to the standard protocol. Multiplanar CT image reconstructions were also generated. COMPARISON:  None. FINDINGS: There is a markedly displaced/comminuted fracture within the distal left femur, with significant impaction at the fracture site and anterior-lateral angulation at the fracture site. Fracture does not appear to extend to the articular surface of either femoral  condyles. No fracture seen within the proximal tibia or fibula. Extensive degenerative change at the right knee, tricompartmental, with most severe joint space narrowing and osteophyte formation at the medial compartment with abutment of the medial femoral head aunt tibial plateau. Joint effusion is present, most prominent within the suprapatellar bursa, moderate in size. Superficial soft tissues are unremarkable. IMPRESSION: 1. Markedly displaced/comminuted fracture of the distal left femur, centered at the distal metaphysis, with prominent impaction at the fracture site and anterior-lateral angulation at the fracture site. Fracture does not appear to extend to the articular surface of either femoral condyle. 2. 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 3. Joint effusion. Electronically Signed   By: Franki Cabot M.D.   On: 02/19/2016 16:35   Dg Humerus Left  02/19/2016  CLINICAL DATA:  Fall.  Severe left leg pain.  Left arm pain. EXAM: LEFT HUMERUS - 2+ VIEW COMPARISON:  12/24/2008 FINDINGS: Old healed fracture within the proximal left humerus. No acute fracture. No subluxation or dislocation. IMPRESSION: No acute bony abnormality. Electronically Signed   By: Rolm Baptise M.D.   On: 02/19/2016 14:31   Dg Foot Complete Left  02/19/2016  CLINICAL DATA:  Patient fell today from standing position. Pt c/o severe left leg pain, left generalized arm pain, and right toe pain. EXAM: LEFT FOOT - COMPLETE 3+ VIEW COMPARISON:  None. FINDINGS: There is a nondisplaced, non comminuted fracture across the medial base of the proximal phalanx of the great toe. No other acute fracture.  No dislocation. Bones are diffusely demineralized. IMPRESSION: Nondisplaced fracture of the medial base of the proximal phalanx of left great toe. Electronically Signed   By: Lajean Manes M.D.   On: 02/19/2016 14:34   Dg Foot Complete  Right  02/19/2016  CLINICAL DATA:  Patient fell today from standing position. Pt c/o severe left leg pain, left generalized arm pain, and right toe pain. EXAM: RIGHT FOOT COMPLETE - 3+ VIEW COMPARISON:  09/30/2013 FINDINGS: There is nondisplaced fracture across the lateral base of the proximal phalanx of the great toe. No other acute fractures. Old healed fracture of the proximal first metatarsal. Joints are normally aligned. Bones are diffusely demineralized. IMPRESSION: 1. Nondisplaced fracture across the lateral base of the proximal phalanx of the right great toe. No other fractures. No dislocation. Electronically Signed   By: Lajean Manes M.D.   On: 02/19/2016 14:36   Dg Femur Min 2 Views Left  02/19/2016  CLINICAL DATA:  Patient fell today from standing position. Pt c/o severe left leg pain, left generalized arm pain, and right toe pain. EXAM: LEFT FEMUR 2 VIEWS COMPARISON:  None. FINDINGS: There is an oblique fracture of the distal femur. Fracture is mildly comminuted. Fracture is also mildly displaced with the distal primary fracture component telescoping on the shaft component by 2.7 cm. There is approximate 1 cm of lateral displacement. There is mild posterior angulation. No other fracture.  No dislocation. Bones are demineralized. IMPRESSION: 1. Fracture of the distal left femur as described.  No dislocation. Electronically Signed   By: Lajean Manes M.D.   On: 02/19/2016 14:33    EKG: Independently reviewed. pending  Assessment/Plan  Fracture, femur, distal (HCC) and fractures of Great toes -Per orthopedics  Diabetes mellitus  -On oral hypoglycemics at home, which I have held, use short-acting sliding-scale insulin in the hospital instead -Sliding-scale insulin Q4 while NPO  HTN (hypertension), benign -Hold lisinopril, PRN hydralazine for systolic blood pressure greater than 170  Depression Resume home dose of Wellbutrin, Zoloft, Klonopin daily at bedtime  Osteoporosis -H/o  Vit D defi, supposed to be on Ergocalciferol -also check TSH/T4 -needs aggressive osteoporosis management per PCP  DVT prophylaxis: SCDs Code Status:DNR Family Communication: none at bedside Disposition Plan: inpatient Consults called: Ortho Dr.Xu Admission status: Inpatient  Domenic Polite MD [facility] Pager [phone]  If 7PM-7AM, please contact night-coverage [email or web address] Password [facility]  02/19/2016, 9:27 PM

## 2016-02-19 NOTE — H&P (Deleted)
Medical Consultation   Katrina FRISBY  Torres:[id number]  DOB: [birth year]  DOA: 02/19/2016  PCP: Myrtis Ser, MD  Requesting physician: EDp Dr.Little  Reason for consultation: Medical management: Diabetes, HTN   History of Present Illness: [name] is an 65 y.o. female with past medical history of diabetes, hypertension, osteoporosis, depression presents to the ER after a mechanical fall. Patient reports that she was walking back after emptying her trash and subsequently tripped since she is not paying attention and landed on the floor. She denies any dizziness, loss of consciousness, chest pain palpitations etc. Subsequently had severe pain in her left thigh, knee and that the great toes in both her feet. She has a history of balance problems and falls relatively frequently. In the emergency room she was noted to have displaced/comminuted fracture of distal left femur and fractures involving the great toes of both her feet.  Review of Systems:  ROS As per HPI , all other systems reviewed and are negative    Past Medical History: Past Medical History  Diagnosis Date  . Diabetes mellitus without complication ([place])   . Hypertension   . Depression   . Osteoporosis     Past Surgical History: Past Surgical History  Procedure Laterality Date  . Orthopedic surgery    . Abdominal hysterectomy    . Cesarean section    . Cholecystectomy    . Pancreas surgery       Allergies:  No Known Allergies   Social History:  reports that she has never smoked. She does not have any smokeless tobacco history on file. She reports that she does not drink alcohol. Her drug history is not on file.   Family History: -h/o osteoporosis in her mother   Physical Exam: Filed Vitals:   02/19/16 1251 02/19/16 1300 02/19/16 1445 02/19/16 1530  BP: 168/98 175/76 146/125 176/155  Pulse: 105 104 106 105  Temp: 98.2 F (36.8 C)     TempSrc: Oral     Resp: 19 18 15 11     SpO2: 99% 94% 97% 94%    Constitutional: Alert and awake, oriented x3, Uncomfortable appearing due to pain Eyes: PERLA, EOMI, irises appear normal, anicteric sclera,  ENMT: Pupils small but reactive, oral mucosa moist, poor dental hygiene            Neck: neck appears normal, no masses, normal ROM, no thyromegaly, no JVD  CVS: S1-S2 clear, no murmur rubs or gallops, no LE edema, normal pedal pulses  Respiratory:  clear to auscultation bilaterally, no wheezing, rales or rhonchi. Respiratory effort normal. No accessory muscle use.  Abdomen: soft nontender, nondistended, normal bowel sounds, no hepatosplenomegaly, no hernias  Musculoskeletal: : no cyanosis, clubbing or edema noted bilaterally                       left knee immobilized, swelling and discoloration of both great toes Neuro: Cranial nerves II-XII intact, strength, sensation, reflexes Psych: judgement and insight appear normal, stable mood and affect, mental status Skin: no rashes or lesions or ulcers, no induration or nodules    Data reviewed:  I have personally reviewed following labs and imaging studies Labs:  CBC:  Recent Labs Lab 02/19/16 1514 02/19/16 1534  WBC 15.1*  --   NEUTROABS 13.9*  --   HGB 10.7* 11.9*  HCT 34.5* 35.0*  MCV 92.5  --   PLT  289  --     Basic Metabolic Panel:  Recent Labs Lab 02/19/16 1514 02/19/16 1534  NA 138 137  K 4.5 4.5  CL 104 104  CO2 22  --   GLUCOSE 270* 280*  BUN 25* 28*  CREATININE 0.85 0.80  CALCIUM 9.1  --    GFR CrCl cannot be calculated (Unknown ideal weight.). Liver Function Tests: No results for input(s): AST, ALT, ALKPHOS, BILITOT, PROT, ALBUMIN in the last 168 hours. No results for input(s): LIPASE, AMYLASE in the last 168 hours. No results for input(s): AMMONIA in the last 168 hours. Coagulation profile No results for input(s): INR, PROTIME in the last 168 hours.  Cardiac Enzymes: No results for input(s): CKTOTAL, CKMB, CKMBINDEX, TROPONINI in the  last 168 hours. BNP: Invalid input(s): POCBNP CBG: No results for input(s): GLUCAP in the last 168 hours. D-Dimer No results for input(s): DDIMER in the last 72 hours. Hgb A1c No results for input(s): HGBA1C in the last 72 hours. Lipid Profile No results for input(s): CHOL, HDL, LDLCALC, TRIG, CHOLHDL, LDLDIRECT in the last 72 hours. Thyroid function studies No results for input(s): TSH, T4TOTAL, T3FREE, THYROIDAB in the last 72 hours.  Invalid input(s): FREET3 Anemia work up No results for input(s): VITAMINB12, FOLATE, FERRITIN, TIBC, IRON, RETICCTPCT in the last 72 hours. Urinalysis    Component Value Date/Time   COLORURINE YELLOW 12/01/2007 1025   APPEARANCEUR CLOUDY* 12/01/2007 1025   LABSPEC >1.030* 12/01/2007 1025   PHURINE 5.5 12/01/2007 1025   GLUCOSEU NEGATIVE 12/01/2007 1025   HGBUR LARGE* 12/01/2007 1025   BILIRUBINUR NEGATIVE 12/01/2007 1025   KETONESUR TRACE* 12/01/2007 1025   PROTEINUR 30* 12/01/2007 1025   UROBILINOGEN 0.2 12/01/2007 1025   NITRITE NEGATIVE 12/01/2007 1025   LEUKOCYTESUR MODERATE* 12/01/2007 1025     Microbiology No results found for this or any previous visit (from the past 240 hour(s)).     Inpatient Medications:   Scheduled Meds: . [START ON 02/20/2016] buPROPion  300 mg Oral Daily  . clonazePAM  1 mg Oral QHS  . [START ON 02/20/2016] FLUoxetine  40 mg Oral Daily  . insulin aspart  0-9 Units Subcutaneous Q4H   Continuous Infusions:    Radiological Exams on Admission: Dg Chest 2 View  02/19/2016  CLINICAL DATA:  Pain following fall.  Hypertension. EXAM: CHEST  2 VIEW COMPARISON:  Chest radiograph April 09, 2011 and chest CT July 28, 2013 FINDINGS: There is no edema or consolidation. Heart is upper normal in size with pulmonary vascularity within normal limits. There is atherosclerotic calcification in the aorta. There is mitral annulus calcification. There is no demonstrable adenopathy. There is evidence of old trauma with  remodeling involving the proximal left humerus. There old healed rib fractures on the right. IMPRESSION: No edema or consolidation.  There is aortic atherosclerosis. Electronically Signed   By: Lowella Grip III M.D.   On: 02/19/2016 15:55   Dg Pelvis 1-2 Views  02/19/2016  CLINICAL DATA:  Patient fell today from standing position. Pt c/o severe left leg pain, left generalized arm pain, and right toe pain. EXAM: PELVIS - 1-2 VIEW COMPARISON:  None. FINDINGS: No fracture.  No bone lesion.  Bones are demineralized. Hip joints, SI joints and symphysis pubis are normally aligned. IMPRESSION: No fracture or dislocation. Electronically Signed   By: Lajean Manes M.D.   On: 02/19/2016 14:40   Dg Forearm Left  02/19/2016  CLINICAL DATA:  Patient fell today from standing position. Pt c/o severe left  leg pain, left generalized arm pain, and right toe pain. EXAM: LEFT FOREARM - 2 VIEW COMPARISON:  None. FINDINGS: No fracture.  No dislocation.  Bones are demineralized. There is soft tissue swelling along the radial aspect of the distal forearm. No radiopaque foreign body. IMPRESSION: No fracture or dislocation. Electronically Signed   By: Lajean Manes M.D.   On: 02/19/2016 14:37   Dg Tibia/fibula Left  02/19/2016  CLINICAL DATA:  Patient fell today from standing position. Pt c/o severe left leg pain, left generalized arm pain, and right toe pain. EXAM: LEFT TIBIA AND FIBULA - 2 VIEW COMPARISON:  None. FINDINGS: No fracture.  No bone lesion. There are advanced degenerative changes of the knee with marked medial compartment narrowing. Ankle joint is normally spaced and aligned. C/o Bones are demineralized. There are soft tissue and vascular calcifications along the left leg with multiple vascular clips from previous the umbilical vein surgery. Small IMPRESSION: 1. No acute fracture or dislocation. Electronically Signed   By: Lajean Manes M.D.   On: 02/19/2016 14:39   Ct Knee Left Wo Contrast  02/19/2016  CLINICAL  DATA:  Fall from standing position. Severe pain to left knee, toes on left and right feet. EXAM: CT OF THE LEFT KNEE WITHOUT CONTRAST TECHNIQUE: Multidetector CT imaging of the LEFT knee was performed according to the standard protocol. Multiplanar CT image reconstructions were also generated. COMPARISON:  None. FINDINGS: There is a markedly displaced/comminuted fracture within the distal left femur, with significant impaction at the fracture site and anterior-lateral angulation at the fracture site. Fracture does not appear to extend to the articular surface of either femoral condyles. No fracture seen within the proximal tibia or fibula. Extensive degenerative change at the right knee, tricompartmental, with most severe joint space narrowing and osteophyte formation at the medial compartment with abutment of the medial femoral head aunt tibial plateau. Joint effusion is present, most prominent within the suprapatellar bursa, moderate in size. Superficial soft tissues are unremarkable. IMPRESSION: 1. Markedly displaced/comminuted fracture of the distal left femur, centered at the distal metaphysis, with prominent impaction at the fracture site and anterior-lateral angulation at the fracture site. Fracture does not appear to extend to the articular surface of either femoral condyle. 2. 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 3. Joint effusion. Electronically Signed   By: Franki Cabot M.D.   On: 02/19/2016 16:35   Dg Humerus Left  02/19/2016  CLINICAL DATA:  Fall.  Severe left leg pain.  Left arm pain. EXAM: LEFT HUMERUS - 2+ VIEW COMPARISON:  12/24/2008 FINDINGS: Old healed fracture within the proximal left humerus. No acute fracture. No subluxation or dislocation. IMPRESSION: No acute bony abnormality. Electronically Signed   By: Rolm Baptise M.D.   On: 02/19/2016 14:31   Dg Foot Complete Left  02/19/2016   CLINICAL DATA:  Patient fell today from standing position. Pt c/o severe left leg pain, left generalized arm pain, and right toe pain. EXAM: LEFT FOOT - COMPLETE 3+ VIEW COMPARISON:  None. FINDINGS: There is a nondisplaced, non comminuted fracture across the medial base of the proximal phalanx of the great toe. No other acute fracture.  No dislocation. Bones are diffusely demineralized. IMPRESSION: Nondisplaced fracture of the medial base of the proximal phalanx of left great toe. Electronically Signed   By: Lajean Manes M.D.   On: 02/19/2016 14:34   Dg Foot Complete Right  02/19/2016  CLINICAL DATA:  Patient fell today from standing position. Pt c/o severe left leg pain, left generalized arm pain, and right toe pain. EXAM: RIGHT FOOT COMPLETE - 3+ VIEW COMPARISON:  09/30/2013 FINDINGS: There is nondisplaced fracture across the lateral base of the proximal phalanx of the great toe. No other acute fractures. Old healed fracture of the proximal first metatarsal. Joints are normally aligned. Bones are diffusely demineralized. IMPRESSION: 1. Nondisplaced fracture across the lateral base of the proximal phalanx of the right great toe. No other fractures. No dislocation. Electronically Signed   By: Lajean Manes M.D.   On: 02/19/2016 14:36   Dg Femur Min 2 Views Left  02/19/2016  CLINICAL DATA:  Patient fell today from standing position. Pt c/o severe left leg pain, left generalized arm pain, and right toe pain. EXAM: LEFT FEMUR 2 VIEWS COMPARISON:  None. FINDINGS: There is an oblique fracture of the distal femur. Fracture is mildly comminuted. Fracture is also mildly displaced with the distal primary fracture component telescoping on the shaft component by 2.7 cm. There is approximate 1 cm of lateral displacement. There is mild posterior angulation. No other fracture.  No dislocation. Bones are demineralized. IMPRESSION: 1. Fracture of the distal left femur as described.  No dislocation. Electronically Signed   By:  Lajean Manes M.D.   On: 02/19/2016 14:33    Impression/Recommendations Principal Problem:  Fracture, femur, distal ([place]) -Per orthopedics   Diabetes mellitus  -On oral hypoglycemics at home, which I have held, use short-acting sliding-scale insulin in the hospital instead -Sliding-scale insulin Q4 while NPO   HTN (hypertension), benign -Hold lisinopril, when necessary hydralazine for systolic blood pressure greater than 170   Depression  Resume home dose of Wellbutrin, Zoloft, Klonopin daily at bedtime  Depression   Osteoporosis -H/o Vit D defi, supposed to be on Ergocalciferol -also check TSH/T4 -needs aggressive osteoporosis management per PCP  I also discussed code status with patient and she wishes to be DNR, i have ordered this  Thank you for this consultation.  Our [facility] hospitalist team will follow the patient with you.   Time Spent: 67mn  Alnisa Hasley M.D. [facility] 02/19/2016, 5:23 PM

## 2016-02-19 NOTE — ED Notes (Signed)
2nd attempt to give report  rn getting report from 3 other patients  i will call back again

## 2016-02-19 NOTE — Consult Note (Deleted)
Medical Consultation   [name]  FGH:[id number]  DOB: 10-30-1950  DOA: 02/19/2016  PCP: Myrtis Ser, MD Requesting physician: EDp Dr.Little  Reason for consultation: Medical management: Diabetes, HTN  History of Present Illness: [name] is an 65 y.o. female with past medical history of diabetes, hypertension, osteoporosis, depression presents to the ER after a mechanical fall. Patient reports that she was walking back after emptying her trash and subsequently tripped since she is not paying attention and landed on the floor. She denies any dizziness, loss of consciousness, chest pain palpitations etc. Subsequently had severe pain in her left thigh, knee and that the great toes in both her feet. She has a history of balance problems and falls relatively frequently. In the emergency room she was noted to have displaced/comminuted fracture of distal left femur and fractures involving the great toes of both her feet.  Review of Systems:  As per HPI , all other systems reviewed and are negative  ROS  Past Medical History: Past Medical History  Diagnosis Date  . Diabetes mellitus without complication ([place])   . Hypertension   . Depression   . Osteoporosis     Past Surgical History: Past Surgical History  Procedure Laterality Date  . Orthopedic surgery    . Abdominal hysterectomy    . Cesarean section    . Cholecystectomy    . Pancreas surgery       Allergies:  No Known Allergies   Social History:  reports that she has never smoked. She does not have any smokeless tobacco history on file. She reports that she does not drink alcohol. Her drug history is not on file.   Family History: -h/o osteoporosis in her mother   Physical Exam: Filed Vitals:   02/19/16 1251 02/19/16 1300 02/19/16 1445 02/19/16 1530  BP: 168/98 175/76 146/125 176/155  Pulse: 105 104 106 105  Temp: 98.2 F (36.8 C)     TempSrc: Oral     Resp: 19 18  15 11   SpO2: 99% 94% 97% 94%   Constitutional: Alert and awake, oriented x3, Uncomfortable appearing due to pain Eyes: PERLA, EOMI, irises appear normal, anicteric sclera,  ENMT: Pupils small but reactive, oral mucosa moist, poor dental hygiene   Neck: neck appears normal, no masses, normal ROM, no thyromegaly, no JVD  CVS: S1-S2 clear, no murmur rubs or gallops, no LE edema, normal pedal pulses  Respiratory: clear to auscultation bilaterally, no wheezing, rales or rhonchi. Respiratory effort normal. No accessory muscle use.  Abdomen: soft nontender, nondistended, normal bowel sounds, no hepatosplenomegaly, no hernias  Musculoskeletal: : no cyanosis, clubbing or edema noted bilaterally  left knee immobilized, swelling and discoloration of both great toes Neuro: Cranial nerves II-XII intact, strength, sensation, reflexes Psych: judgement and insight appear normal, stable mood and affect, mental status Skin: no rashes or lesions or ulcers, no induration or nodules    Data reviewed:  I have personally reviewed following labs and imaging studies Labs:  CBC:  Recent Labs Lab 02/19/16 1514 02/19/16 1534  WBC 15.1*  --   NEUTROABS 13.9*  --   HGB 10.7* 11.9*  HCT 34.5* 35.0*  MCV 92.5  --   PLT 289  --     Basic Metabolic Panel:  Recent Labs Lab 02/19/16 1514 02/19/16 1534  NA 138 137  K 4.5 4.5  CL 104 104  CO2 22  --  GLUCOSE 270* 280*  BUN 25* 28*  CREATININE 0.85 0.80  CALCIUM 9.1  --    GFR CrCl cannot be calculated (Unknown ideal weight.). Liver Function Tests: No results for input(s): AST, ALT, ALKPHOS, BILITOT, PROT, ALBUMIN in the last 168 hours. No results for input(s): LIPASE, AMYLASE in the last 168 hours. No results for input(s): AMMONIA in the last 168 hours. Coagulation profile No results for input(s): INR, PROTIME in the last 168 hours.  Cardiac Enzymes: No results for input(s): CKTOTAL, CKMB, CKMBINDEX,  TROPONINI in the last 168 hours. BNP: Invalid input(s): POCBNP CBG: No results for input(s): GLUCAP in the last 168 hours. D-Dimer No results for input(s): DDIMER in the last 72 hours. Hgb A1c No results for input(s): HGBA1C in the last 72 hours. Lipid Profile No results for input(s): CHOL, HDL, LDLCALC, TRIG, CHOLHDL, LDLDIRECT in the last 72 hours. Thyroid function studies No results for input(s): TSH, T4TOTAL, T3FREE, THYROIDAB in the last 72 hours.  Invalid input(s): FREET3 Anemia work up No results for input(s): VITAMINB12, FOLATE, FERRITIN, TIBC, IRON, RETICCTPCT in the last 72 hours. Urinalysis    Component Value Date/Time   COLORURINE YELLOW 12/01/2007 1025   APPEARANCEUR CLOUDY* 12/01/2007 1025   LABSPEC >1.030* 12/01/2007 1025   PHURINE 5.5 12/01/2007 1025   GLUCOSEU NEGATIVE 12/01/2007 1025   HGBUR LARGE* 12/01/2007 1025   BILIRUBINUR NEGATIVE 12/01/2007 1025   KETONESUR TRACE* 12/01/2007 1025   PROTEINUR 30* 12/01/2007 1025   UROBILINOGEN 0.2 12/01/2007 1025   NITRITE NEGATIVE 12/01/2007 1025   LEUKOCYTESUR MODERATE* 12/01/2007 1025     Microbiology No results found for this or any previous visit (from the past 240 hour(s)).     Inpatient Medications:   Scheduled Meds: . [START ON 02/20/2016] buPROPion  300 mg Oral Daily  . clonazePAM  1 mg Oral QHS  . [START ON 02/20/2016] FLUoxetine  40 mg Oral Daily  . insulin aspart  0-9 Units Subcutaneous Q4H   Continuous Infusions: . sodium chloride       Radiological Exams on Admission: Dg Chest 2 View  02/19/2016  CLINICAL DATA:  Pain following fall.  Hypertension. EXAM: CHEST  2 VIEW COMPARISON:  Chest radiograph April 09, 2011 and chest CT July 28, 2013 FINDINGS: There is no edema or consolidation. Heart is upper normal in size with pulmonary vascularity within normal limits. There is atherosclerotic calcification in the aorta. There is mitral annulus calcification. There is no demonstrable adenopathy.  There is evidence of old trauma with remodeling involving the proximal left humerus. There old healed rib fractures on the right. IMPRESSION: No edema or consolidation.  There is aortic atherosclerosis. Electronically Signed   By: Lowella Grip III M.D.   On: 02/19/2016 15:55   Dg Pelvis 1-2 Views  02/19/2016  CLINICAL DATA:  Patient fell today from standing position. Pt c/o severe left leg pain, left generalized arm pain, and right toe pain. EXAM: PELVIS - 1-2 VIEW COMPARISON:  None. FINDINGS: No fracture.  No bone lesion.  Bones are demineralized. Hip joints, SI joints and symphysis pubis are normally aligned. IMPRESSION: No fracture or dislocation. Electronically Signed   By: Lajean Manes M.D.   On: 02/19/2016 14:40   Dg Forearm Left  02/19/2016  CLINICAL DATA:  Patient fell today from standing position. Pt c/o severe left leg pain, left generalized arm pain, and right toe pain. EXAM: LEFT FOREARM - 2 VIEW COMPARISON:  None. FINDINGS: No fracture.  No dislocation.  Bones are demineralized. There is  soft tissue swelling along the radial aspect of the distal forearm. No radiopaque foreign body. IMPRESSION: No fracture or dislocation. Electronically Signed   By: Lajean Manes M.D.   On: 02/19/2016 14:37   Dg Tibia/fibula Left  02/19/2016  CLINICAL DATA:  Patient fell today from standing position. Pt c/o severe left leg pain, left generalized arm pain, and right toe pain. EXAM: LEFT TIBIA AND FIBULA - 2 VIEW COMPARISON:  None. FINDINGS: No fracture.  No bone lesion. There are advanced degenerative changes of the knee with marked medial compartment narrowing. Ankle joint is normally spaced and aligned. C/o Bones are demineralized. There are soft tissue and vascular calcifications along the left leg with multiple vascular clips from previous the umbilical vein surgery. Small IMPRESSION: 1. No acute fracture or dislocation. Electronically Signed   By: Lajean Manes M.D.   On: 02/19/2016 14:39   Ct Knee Left  Wo Contrast  02/19/2016  CLINICAL DATA:  Fall from standing position. Severe pain to left knee, toes on left and right feet. EXAM: CT OF THE LEFT KNEE WITHOUT CONTRAST TECHNIQUE: Multidetector CT imaging of the LEFT knee was performed according to the standard protocol. Multiplanar CT image reconstructions were also generated. COMPARISON:  None. FINDINGS: There is a markedly displaced/comminuted fracture within the distal left femur, with significant impaction at the fracture site and anterior-lateral angulation at the fracture site. Fracture does not appear to extend to the articular surface of either femoral condyles. No fracture seen within the proximal tibia or fibula. Extensive degenerative change at the right knee, tricompartmental, with most severe joint space narrowing and osteophyte formation at the medial compartment with abutment of the medial femoral head aunt tibial plateau. Joint effusion is present, most prominent within the suprapatellar bursa, moderate in size. Superficial soft tissues are unremarkable. IMPRESSION: 1. Markedly displaced/comminuted fracture of the distal left femur, centered at the distal metaphysis, with prominent impaction at the fracture site and anterior-lateral angulation at the fracture site. Fracture does not appear to extend to the articular surface of either femoral condyle. 2. 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 3. Joint effusion. Electronically Signed   By: Franki Cabot M.D.   On: 02/19/2016 16:35   Dg Humerus Left  02/19/2016  CLINICAL DATA:  Fall.  Severe left leg pain.  Left arm pain. EXAM: LEFT HUMERUS - 2+ VIEW COMPARISON:  12/24/2008 FINDINGS: Old healed fracture within the proximal left humerus. No acute fracture. No subluxation or dislocation. IMPRESSION: No acute bony abnormality. Electronically Signed   By: Rolm Baptise M.D.   On: 02/19/2016 14:31    Dg Foot Complete Left  02/19/2016  CLINICAL DATA:  Patient fell today from standing position. Pt c/o severe left leg pain, left generalized arm pain, and right toe pain. EXAM: LEFT FOOT - COMPLETE 3+ VIEW COMPARISON:  None. FINDINGS: There is a nondisplaced, non comminuted fracture across the medial base of the proximal phalanx of the great toe. No other acute fracture.  No dislocation. Bones are diffusely demineralized. IMPRESSION: Nondisplaced fracture of the medial base of the proximal phalanx of left great toe. Electronically Signed   By: Lajean Manes M.D.   On: 02/19/2016 14:34   Dg Foot Complete Right  02/19/2016  CLINICAL DATA:  Patient fell today from standing position. Pt c/o severe left leg pain, left generalized arm pain, and right toe pain. EXAM: RIGHT FOOT COMPLETE - 3+ VIEW  COMPARISON:  09/30/2013 FINDINGS: There is nondisplaced fracture across the lateral base of the proximal phalanx of the great toe. No other acute fractures. Old healed fracture of the proximal first metatarsal. Joints are normally aligned. Bones are diffusely demineralized. IMPRESSION: 1. Nondisplaced fracture across the lateral base of the proximal phalanx of the right great toe. No other fractures. No dislocation. Electronically Signed   By: Lajean Manes M.D.   On: 02/19/2016 14:36   Dg Femur Min 2 Views Left  02/19/2016  CLINICAL DATA:  Patient fell today from standing position. Pt c/o severe left leg pain, left generalized arm pain, and right toe pain. EXAM: LEFT FEMUR 2 VIEWS COMPARISON:  None. FINDINGS: There is an oblique fracture of the distal femur. Fracture is mildly comminuted. Fracture is also mildly displaced with the distal primary fracture component telescoping on the shaft component by 2.7 cm. There is approximate 1 cm of lateral displacement. There is mild posterior angulation. No other fracture.  No dislocation. Bones are demineralized. IMPRESSION: 1. Fracture of the distal left femur as described.  No  dislocation. Electronically Signed   By: Lajean Manes M.D.   On: 02/19/2016 14:33    Impression/Recommendations Fracture, femur, distal ([place]) -Per orthopedics  Diabetes mellitus  -On oral hypoglycemics at home, which I have held, use short-acting sliding-scale insulin in the hospital instead -Sliding-scale insulin Q4 while NPO  HTN (hypertension), benign -Hold lisinopril, when necessary hydralazine for systolic blood pressure greater than 170  Depression Resume home dose of Wellbutrin, Zoloft, Klonopin daily at bedtime Depression  Osteoporosis -H/o Vit D defi, supposed to be on Ergocalciferol -also check TSH/T4 -needs aggressive osteoporosis management per PCP  I also discussed code status with patient and she wishes to be DNR, i have ordered this  Thank you for this consultation. Our [facility] hospitalist team will follow the patient with you.   Thank you for this consultation.  Our [facility] hospitalist team will follow the patient with you.   Time Spent: 29mn  Iyana Topor M.D. [facility] 02/19/2016, 5:36 PM

## 2016-02-19 NOTE — ED Notes (Signed)
The pt was changed  The tarp that was underneath the pt from ems was removed and the pt appears more comfortable.  She as areas of redness beneath both breasts and in her groin area and under abd folds

## 2016-02-19 NOTE — ED Notes (Signed)
The pt is c/o being too hot  Her sats are low but she has removed the nasal cannula because she did not like the way it made her nose  feel

## 2016-02-19 NOTE — Progress Notes (Signed)
Orthopedic Tech Progress Note Patient Details:  [name] 12/21/1950 [id number]  Ortho Devices Type of Ortho Device: Knee Immobilizer Ortho Device/Splint Location: lle Ortho Device/Splint Interventions: Application   Leigh Kaeding 02/19/2016, 3:41 PM

## 2016-02-19 NOTE — ED Notes (Signed)
EDP little advised patient she was able to drink water. Phlebotomy gave water to patient.

## 2016-02-19 NOTE — ED Notes (Signed)
One unsuccessful attempt to call report

## 2016-02-19 NOTE — ED Provider Notes (Signed)
CSN: [id number]     Arrival date & time 02/19/16  1245 History   First MD Initiated Contact with Patient 02/19/16 1246     Chief Complaint  Patient presents with  . Fall  . Leg Pain     (Consider location/radiation/quality/duration/timing/severity/associated sxs/prior Treatment) HPI Comments: 65yo F w/ PMH including HTN, T2DM, depression and osteoporosis who p/w multiple injuries after a fall. Just prior to arrival, the patient was walking through a door when she tripped on the door frame, falling onto her left side. She did not lose consciousness or strike her head. She reports severe left leg pain which she cannot localize as well as left arm pain and right great toe pain. She endorses normal sensation throughout. No neck or back pain. No chest or abdominal pain. No anticoagulant use. She received 16 mg morphine by EMS in route. She does endorse nausea.  Patient is a 65 y.o. female presenting with fall and leg pain. The history is provided by the patient.  Fall  Leg Pain   Past Medical History  Diagnosis Date  . Diabetes mellitus without complication ([place])   . Hypertension   . Depression   . Osteoporosis    Past Surgical History  Procedure Laterality Date  . Orthopedic surgery    . Abdominal hysterectomy    . Cesarean section    . Cholecystectomy    . Pancreas surgery     No family history on file. Social History  Substance Use Topics  . Smoking status: Never Smoker   . Smokeless tobacco: None  . Alcohol Use: No   OB History    No data available     Review of Systems 10 Systems reviewed and are negative for acute change except as noted in the HPI.    Allergies  Review of patient's allergies indicates no known allergies.  Home Medications   Prior to Admission medications   Medication Sig Start Date End Date Taking? Authorizing Provider  acetaminophen (TYLENOL) 325 MG tablet Take 650 mg by mouth every 6 (six) hours as needed for mild pain.   Yes Historical  Provider, MD  buPROPion (WELLBUTRIN XL) 300 MG 24 hr tablet Take 300 mg by mouth daily. 07/06/13  Yes Historical Provider, MD  clonazePAM (KLONOPIN) 1 MG tablet Take 1 mg by mouth at bedtime. 07/09/13  Yes Historical Provider, MD  FLUoxetine (PROZAC) 20 MG capsule Take 40 mg by mouth daily.  06/27/13  Yes Historical Provider, MD  glipiZIDE (GLUCOTROL) 10 MG tablet Take 10 mg by mouth 2 (two) times daily. 05/24/13  Yes Historical Provider, MD  lisinopril (PRINIVIL,ZESTRIL) 20 MG tablet Take 20 mg by mouth daily. 06/09/13  Yes Historical Provider, MD  meloxicam (MOBIC) 7.5 MG tablet Take 7.5 mg by mouth daily as needed for pain.  05/05/13  Yes Historical Provider, MD  metFORMIN (GLUCOPHAGE) 1000 MG tablet Take 1,000 mg by mouth 2 (two) times daily. 05/05/13  Yes Historical Provider, MD  pioglitazone (ACTOS) 45 MG tablet Take 45 mg by mouth daily. 05/24/13  Yes Historical Provider, MD  Vitamin D, Ergocalciferol, (DRISDOL) 50000 units CAPS capsule Take 50,000 Units by mouth every 7 (seven) days.   Yes Historical Provider, MD  HYDROcodone-acetaminophen (NORCO/VICODIN) 5-325 MG per tablet Take 1 tablet by mouth every 6 (six) hours as needed for moderate pain. Patient not taking: Reported on 02/19/2016 07/28/13   Evelina Bucy, MD   BP 176/155 mmHg  Pulse 105  Temp(Src) 98.2 F (36.8 C) (Oral)  Resp 11  SpO2 94% Physical Exam  Constitutional: She is oriented to person, place, and time. She appears well-developed and well-nourished. No distress.  Uncomfortable  HENT:  Head: Normocephalic and atraumatic.  Moist mucous membranes  Eyes: Conjunctivae are normal. Pupils are equal, round, and reactive to light.  Neck: Normal range of motion. Neck supple.  No midline spinal tenderness  Cardiovascular: Normal rate, regular rhythm, normal heart sounds and intact distal pulses.   No murmur heard. Pulmonary/Chest: Effort normal and breath sounds normal. She exhibits no tenderness.  Abdominal: Soft. Bowel sounds  are normal. She exhibits no distension. There is no tenderness.  Musculoskeletal: She exhibits tenderness.  Tenderness to palpation of left upper humerus, distal left forearm, left knee, left dorsal foot, and right great toe w/ limited ROM of L leg due to pain; 2+ distal pulses  Neurological: She is alert and oriented to person, place, and time.  Fluent speech, normal sensation x all 4 ext  Skin: Skin is warm and dry.  Ecchymoses R upper arm  Psychiatric: She has a normal mood and affect. Judgment normal.  Nursing note and vitals reviewed.   ED Course  Procedures (including critical care time) Labs Review Labs Reviewed  CBC WITH DIFFERENTIAL/PLATELET - Abnormal; Notable for the following:    WBC 15.1 (*)    RBC 3.73 (*)    Hemoglobin 10.7 (*)    HCT 34.5 (*)    Neutro Abs 13.9 (*)    All other components within normal limits  I-STAT CHEM 8, ED - Abnormal; Notable for the following:    BUN 28 (*)    Glucose, Bld 280 (*)    Calcium, Ion 1.06 (*)    Hemoglobin 11.9 (*)    HCT 35.0 (*)    All other components within normal limits  BASIC METABOLIC PANEL    Imaging Review Dg Chest 2 View  02/19/2016  CLINICAL DATA:  Pain following fall.  Hypertension. EXAM: CHEST  2 VIEW COMPARISON:  Chest radiograph April 09, 2011 and chest CT July 28, 2013 FINDINGS: There is no edema or consolidation. Heart is upper normal in size with pulmonary vascularity within normal limits. There is atherosclerotic calcification in the aorta. There is mitral annulus calcification. There is no demonstrable adenopathy. There is evidence of old trauma with remodeling involving the proximal left humerus. There old healed rib fractures on the right. IMPRESSION: No edema or consolidation.  There is aortic atherosclerosis. Electronically Signed   By: Lowella Grip III M.D.   On: 02/19/2016 15:55   Dg Pelvis 1-2 Views  02/19/2016  CLINICAL DATA:  Patient fell today from standing position. Pt c/o severe left leg  pain, left generalized arm pain, and right toe pain. EXAM: PELVIS - 1-2 VIEW COMPARISON:  None. FINDINGS: No fracture.  No bone lesion.  Bones are demineralized. Hip joints, SI joints and symphysis pubis are normally aligned. IMPRESSION: No fracture or dislocation. Electronically Signed   By: Lajean Manes M.D.   On: 02/19/2016 14:40   Dg Forearm Left  02/19/2016  CLINICAL DATA:  Patient fell today from standing position. Pt c/o severe left leg pain, left generalized arm pain, and right toe pain. EXAM: LEFT FOREARM - 2 VIEW COMPARISON:  None. FINDINGS: No fracture.  No dislocation.  Bones are demineralized. There is soft tissue swelling along the radial aspect of the distal forearm. No radiopaque foreign body. IMPRESSION: No fracture or dislocation. Electronically Signed   By: Lajean Manes M.D.   On: 02/19/2016 14:37  Dg Tibia/fibula Left  02/19/2016  CLINICAL DATA:  Patient fell today from standing position. Pt c/o severe left leg pain, left generalized arm pain, and right toe pain. EXAM: LEFT TIBIA AND FIBULA - 2 VIEW COMPARISON:  None. FINDINGS: No fracture.  No bone lesion. There are advanced degenerative changes of the knee with marked medial compartment narrowing. Ankle joint is normally spaced and aligned. C/o Bones are demineralized. There are soft tissue and vascular calcifications along the left leg with multiple vascular clips from previous the umbilical vein surgery. Small IMPRESSION: 1. No acute fracture or dislocation. Electronically Signed   By: Lajean Manes M.D.   On: 02/19/2016 14:39   Dg Humerus Left  02/19/2016  CLINICAL DATA:  Fall.  Severe left leg pain.  Left arm pain. EXAM: LEFT HUMERUS - 2+ VIEW COMPARISON:  12/24/2008 FINDINGS: Old healed fracture within the proximal left humerus. No acute fracture. No subluxation or dislocation. IMPRESSION: No acute bony abnormality. Electronically Signed   By: Rolm Baptise M.D.   On: 02/19/2016 14:31   Dg Foot Complete Left  02/19/2016  CLINICAL  DATA:  Patient fell today from standing position. Pt c/o severe left leg pain, left generalized arm pain, and right toe pain. EXAM: LEFT FOOT - COMPLETE 3+ VIEW COMPARISON:  None. FINDINGS: There is a nondisplaced, non comminuted fracture across the medial base of the proximal phalanx of the great toe. No other acute fracture.  No dislocation. Bones are diffusely demineralized. IMPRESSION: Nondisplaced fracture of the medial base of the proximal phalanx of left great toe. Electronically Signed   By: Lajean Manes M.D.   On: 02/19/2016 14:34   Dg Foot Complete Right  02/19/2016  CLINICAL DATA:  Patient fell today from standing position. Pt c/o severe left leg pain, left generalized arm pain, and right toe pain. EXAM: RIGHT FOOT COMPLETE - 3+ VIEW COMPARISON:  09/30/2013 FINDINGS: There is nondisplaced fracture across the lateral base of the proximal phalanx of the great toe. No other acute fractures. Old healed fracture of the proximal first metatarsal. Joints are normally aligned. Bones are diffusely demineralized. IMPRESSION: 1. Nondisplaced fracture across the lateral base of the proximal phalanx of the right great toe. No other fractures. No dislocation. Electronically Signed   By: Lajean Manes M.D.   On: 02/19/2016 14:36   Dg Femur Min 2 Views Left  02/19/2016  CLINICAL DATA:  Patient fell today from standing position. Pt c/o severe left leg pain, left generalized arm pain, and right toe pain. EXAM: LEFT FEMUR 2 VIEWS COMPARISON:  None. FINDINGS: There is an oblique fracture of the distal femur. Fracture is mildly comminuted. Fracture is also mildly displaced with the distal primary fracture component telescoping on the shaft component by 2.7 cm. There is approximate 1 cm of lateral displacement. There is mild posterior angulation. No other fracture.  No dislocation. Bones are demineralized. IMPRESSION: 1. Fracture of the distal left femur as described.  No dislocation. Electronically Signed   By: Lajean Manes M.D.   On: 02/19/2016 14:33   I have personally reviewed and evaluated these images as part of my medical decision-making.   EKG Interpretation None     Medications  HYDROmorphone (DILAUDID) injection 1 mg (1 mg Intravenous Given 02/19/16 1337)  ondansetron (ZOFRAN) injection 4 mg (4 mg Intravenous Given 02/19/16 1438)  HYDROmorphone (DILAUDID) injection 1 mg (1 mg Intravenous Given 02/19/16 1533)    MDM   Final diagnoses:  Femur fracture, left, closed, initial encounter  Fracture of great toe, left, closed, initial encounter  Fracture of great toe, right, closed, initial encounter   Patient presents with left arm and left leg pain after a mechanical fall, no loss of consciousness or head injury. She was uncomfortable but in no acute distress at presentation. Vital signs notable for BP 168/98, heart rate 105. She was neurovascularly intact distally. Injuries noted as above. Obtained above plain films to evaluate for acute injury. Gave the patient Zofran and Dilaudid for ongoing pain.  Imaging shows fractures of bilateral proximal phalanxes of great toes. Left leg with distal femur fracture, comminuted and mildly displaced. I discussed with orthopedics, Dr. Sherrian Divers, who will see the patient in consultation and likely repair tomorrow. He has recommended medicine admission. Contacted [facility] and discussed with Dr. Broadus John, who stated that medicine would not admit this type of fracture, only hip fractures. We will recontact Dr. Sherrian Divers; I am signing out to the oncoming provider who will admit patient for further care.  Sharlett Iles, MD 02/19/16 [phone]

## 2016-02-19 NOTE — ED Notes (Signed)
Dilaudid is causing a drop in her 02 sats.  Nasal 02 at 4 liters  She is sleeping soundly

## 2016-02-19 NOTE — ED Notes (Signed)
Pt c/o pain .  Pain med given pt back to c-t  Pt drinking ice water given by the edp  Dr little

## 2016-02-19 NOTE — ED Notes (Signed)
Report given to paulette on 5n

## 2016-02-19 NOTE — ED Notes (Signed)
Pt presents via [facility] after a mechanical fall.  Pt c/o severe left leg pain, left wrist pain, and right toe pain.  Denies LOC, no neck or back pain.  Pt received 33m Morphine en route decreasing pain from 10/10 to 8/10.  Hx: osteoporosis and multiple fractures.  BP-126/80 O2-97% RA.  CBG 212.  EMS unable to tell if deformity is noted.  A x 4.

## 2016-02-19 NOTE — ED Notes (Signed)
Nasal 02  At 6 liters

## 2016-02-20 ENCOUNTER — Encounter (HOSPITAL_COMMUNITY): Payer: Self-pay | Admitting: General Practice

## 2016-02-20 ENCOUNTER — Encounter (HOSPITAL_COMMUNITY)
Admission: EM | Disposition: A | Discharge status: Skilled Nursing Facility | Payer: Self-pay | Source: Home / Self Care | Attending: Internal Medicine

## 2016-02-20 ENCOUNTER — Inpatient Hospital Stay (HOSPITAL_COMMUNITY): Payer: Medicare Other | Admitting: Anesthesiology

## 2016-02-20 ENCOUNTER — Inpatient Hospital Stay (HOSPITAL_COMMUNITY): Payer: Medicare Other

## 2016-02-20 DIAGNOSIS — S72452A Displaced supracondylar fracture without intracondylar extension of lower end of left femur, initial encounter for closed fracture: Secondary | ICD-10-CM | POA: Diagnosis not present

## 2016-02-20 HISTORY — PX: FEMUR IM NAIL: SHX1597

## 2016-02-20 LAB — GLUCOSE, CAPILLARY
GLUCOSE-CAPILLARY: 135 mg/dL — AB (ref 65–99)
GLUCOSE-CAPILLARY: 189 mg/dL — AB (ref 65–99)
GLUCOSE-CAPILLARY: 230 mg/dL — AB (ref 65–99)
Glucose-Capillary: 252 mg/dL — ABNORMAL HIGH (ref 65–99)
Glucose-Capillary: 156 mg/dL — ABNORMAL HIGH (ref 65–99)
Glucose-Capillary: 152 mg/dL — ABNORMAL HIGH (ref 65–99)

## 2016-02-20 LAB — BASIC METABOLIC PANEL
Anion gap: 6 (ref 5–15)
BUN: 24 mg/dL — ABNORMAL HIGH (ref 6–20)
CO2: 26 mmol/L (ref 22–32)
Calcium: 9 mg/dL (ref 8.9–10.3)
Chloride: 105 mmol/L (ref 101–111)
Creatinine, Ser: 0.94 mg/dL (ref 0.44–1.00)
GFR calc Af Amer: >60 mL/min (ref >60)
GFR calc non Af Amer: >60 mL/min (ref >60)
GLUCOSE: 139 mg/dL — AB (ref 65–99)
POTASSIUM: 4.6 mmol/L (ref 3.5–5.1)
SODIUM: 137 mmol/L (ref 135–145)

## 2016-02-20 LAB — CBC
HCT: 34.3 % — ABNORMAL LOW (ref 36.0–46.0)
HEMATOCRIT: 29.5 % — AB (ref 36.0–46.0)
HEMOGLOBIN: 8.9 g/dL — AB (ref 12.0–15.0)
Hemoglobin: 10.3 g/dL — ABNORMAL LOW (ref 12.0–15.0)
MCH: 27.8 pg (ref 26.0–34.0)
MCH: 27.8 pg (ref 26.0–34.0)
MCHC: 30 g/dL (ref 30.0–36.0)
MCHC: 30.2 g/dL (ref 30.0–36.0)
MCV: 92.2 fL (ref 78.0–100.0)
MCV: 92.7 fL (ref 78.0–100.0)
PLATELETS: 267 10*3/uL (ref 150–400)
PLATELETS: 329 10*3/uL (ref 150–400)
RBC: 3.2 MIL/uL — AB (ref 3.87–5.11)
RBC: 3.7 MIL/uL — AB (ref 3.87–5.11)
RDW: 13.6 % (ref 11.5–15.5)
RDW: 13.6 % (ref 11.5–15.5)
WBC: 13.9 10*3/uL — AB (ref 4.0–10.5)
WBC: 12 10*3/uL — AB (ref 4.0–10.5)

## 2016-02-20 LAB — CREATININE, SERUM
Creatinine, Ser: 1.41 mg/dL — ABNORMAL HIGH (ref 0.44–1.00)
GFR calc non Af Amer: 38 mL/min — ABNORMAL LOW (ref >60)
GFR, EST AFRICAN AMERICAN: 44 mL/min — AB (ref >60)

## 2016-02-20 SURGERY — INSERTION, INTRAMEDULLARY ROD, FEMUR, RETROGRADE
Anesthesia: General | Laterality: Left

## 2016-02-20 MED ORDER — OXYCODONE HCL 5 MG PO TABS: 5.0000 mg | ORAL_TABLET | Freq: Once | ORAL | Status: DC | PRN

## 2016-02-20 MED ORDER — VANCOMYCIN HCL 10 G IV SOLR
1500.0000 mg | Freq: Two times a day (BID) | INTRAVENOUS | Status: DC
  Administered 2016-02-20: 1500 mg via INTRAVENOUS
  Filled 2016-02-20 (×2): qty 1500

## 2016-02-20 MED ORDER — MUPIROCIN 2 % EX OINT
1.0000 | TOPICAL_OINTMENT | Freq: Two times a day (BID) | CUTANEOUS | Status: DC
Start: 2016-02-20 — End: 2016-02-22
  Administered 2016-02-20 – 2016-02-22 (×4): 1 via NASAL
  Filled 2016-02-20 (×3): qty 22

## 2016-02-20 MED ORDER — INSULIN ASPART 100 UNIT/ML ~~LOC~~ SOLN: 0.0000 [IU] | Freq: Every day | SUBCUTANEOUS | Status: DC

## 2016-02-20 MED ORDER — OXYCODONE HCL 5 MG PO TABS
5.0000 mg | ORAL_TABLET | ORAL | Status: DC | PRN
  Administered 2016-02-20 – 2016-02-22 (×2): 10 mg via ORAL
  Filled 2016-02-20: qty 2
  Filled 2016-02-20: qty 1

## 2016-02-20 MED ORDER — FENTANYL CITRATE (PF) 100 MCG/2ML IJ SOLN
25.0000 ug | INTRAMUSCULAR | Status: DC | PRN
  Administered 2016-02-20: 50 ug via INTRAVENOUS

## 2016-02-20 MED ORDER — SODIUM CHLORIDE 0.9 % IV SOLN
INTRAVENOUS | Status: DC
  Administered 2016-02-20 – 2016-02-21 (×3): via INTRAVENOUS

## 2016-02-20 MED ORDER — FENTANYL CITRATE (PF) 250 MCG/5ML IJ SOLN
INTRAMUSCULAR | Status: AC
  Filled 2016-02-20: qty 5

## 2016-02-20 MED ORDER — LACTATED RINGERS IV SOLN
INTRAVENOUS | Status: DC
  Administered 2016-02-20: 10:00:00 via INTRAVENOUS

## 2016-02-20 MED ORDER — ENOXAPARIN SODIUM 40 MG/0.4ML ~~LOC~~ SOLN
40.0000 mg | SUBCUTANEOUS | Status: DC
  Administered 2016-02-21 – 2016-02-22 (×2): 40 mg via SUBCUTANEOUS
  Filled 2016-02-20 (×2): qty 0.4

## 2016-02-20 MED ORDER — METHOCARBAMOL 500 MG PO TABS
500.0000 mg | ORAL_TABLET | Freq: Four times a day (QID) | ORAL | Status: DC | PRN
  Administered 2016-02-20 – 2016-02-22 (×2): 500 mg via ORAL
  Filled 2016-02-20 (×2): qty 1

## 2016-02-20 MED ORDER — METOCLOPRAMIDE HCL 5 MG/ML IJ SOLN: 5.0000 mg | Freq: Three times a day (TID) | INTRAMUSCULAR | Status: DC | PRN

## 2016-02-20 MED ORDER — SUGAMMADEX SODIUM 200 MG/2ML IV SOLN
INTRAVENOUS | Status: DC | PRN
  Administered 2016-02-20: 200 mg via INTRAVENOUS

## 2016-02-20 MED ORDER — OXYCODONE HCL 5 MG PO TABS
ORAL_TABLET | ORAL | Status: AC
  Filled 2016-02-20: qty 2

## 2016-02-20 MED ORDER — OXYCODONE HCL 5 MG PO TABS: 5.0000 mg | ORAL_TABLET | ORAL | Status: DC | PRN

## 2016-02-20 MED ORDER — LIDOCAINE HCL (CARDIAC) 20 MG/ML IV SOLN
INTRAVENOUS | Status: DC | PRN
  Administered 2016-02-20: 60 mg via INTRATRACHEAL

## 2016-02-20 MED ORDER — SUCCINYLCHOLINE CHLORIDE 20 MG/ML IJ SOLN
INTRAMUSCULAR | Status: DC | PRN
  Administered 2016-02-20: 100 mg via INTRAVENOUS

## 2016-02-20 MED ORDER — ENOXAPARIN SODIUM 40 MG/0.4ML ~~LOC~~ SOLN: 40.0000 mg | Freq: Every day | SUBCUTANEOUS | Status: DC

## 2016-02-20 MED ORDER — CHLORHEXIDINE GLUCONATE CLOTH 2 % EX PADS
6.0000 | MEDICATED_PAD | Freq: Every day | CUTANEOUS | Status: DC
  Administered 2016-02-20 – 2016-02-21 (×2): 6 via TOPICAL

## 2016-02-20 MED ORDER — PROPOFOL 10 MG/ML IV BOLUS
INTRAVENOUS | Status: AC
  Filled 2016-02-20: qty 20

## 2016-02-20 MED ORDER — ALUM & MAG HYDROXIDE-SIMETH 200-200-20 MG/5ML PO SUSP
30.0000 mL | ORAL | Status: DC | PRN
  Administered 2016-02-21: 30 mL via ORAL
  Filled 2016-02-20: qty 30

## 2016-02-20 MED ORDER — ONDANSETRON HCL 4 MG PO TABS: 4.0000 mg | ORAL_TABLET | Freq: Four times a day (QID) | ORAL | Status: DC | PRN

## 2016-02-20 MED ORDER — MENTHOL 3 MG MT LOZG
1.0000 | LOZENGE | OROMUCOSAL | Status: DC | PRN
  Administered 2016-02-21: 3 mg via ORAL
  Filled 2016-02-20: qty 9

## 2016-02-20 MED ORDER — MIDAZOLAM HCL 2 MG/2ML IJ SOLN
INTRAMUSCULAR | Status: AC
  Filled 2016-02-20: qty 2

## 2016-02-20 MED ORDER — ONDANSETRON HCL 4 MG/2ML IJ SOLN
INTRAMUSCULAR | Status: AC
  Administered 2016-02-20: 4 mg via INTRAVENOUS
  Filled 2016-02-20: qty 2

## 2016-02-20 MED ORDER — ONDANSETRON HCL 4 MG/2ML IJ SOLN
4.0000 mg | Freq: Once | INTRAMUSCULAR | Status: AC | PRN
  Administered 2016-02-20: 4 mg via INTRAVENOUS

## 2016-02-20 MED ORDER — MIDAZOLAM HCL 5 MG/5ML IJ SOLN
INTRAMUSCULAR | Status: DC | PRN
Start: 2016-02-20 — End: 2016-02-20
  Administered 2016-02-20: 2 mg via INTRAVENOUS

## 2016-02-20 MED ORDER — INSULIN ASPART 100 UNIT/ML ~~LOC~~ SOLN
0.0000 [IU] | Freq: Three times a day (TID) | SUBCUTANEOUS | Status: DC
  Administered 2016-02-20: 3 [IU] via SUBCUTANEOUS
  Administered 2016-02-21: 2 [IU] via SUBCUTANEOUS
  Administered 2016-02-21 (×2): 3 [IU] via SUBCUTANEOUS
  Administered 2016-02-22: 2 [IU] via SUBCUTANEOUS

## 2016-02-20 MED ORDER — PROPOFOL 10 MG/ML IV BOLUS
INTRAVENOUS | Status: DC | PRN
  Administered 2016-02-20: 160 mg via INTRAVENOUS

## 2016-02-20 MED ORDER — 0.9 % SODIUM CHLORIDE (POUR BTL) OPTIME
TOPICAL | Status: DC | PRN
  Administered 2016-02-20: 1000 mL

## 2016-02-20 MED ORDER — MORPHINE SULFATE (PF) 2 MG/ML IV SOLN: 0.5000 mg | INTRAVENOUS | Status: DC | PRN

## 2016-02-20 MED ORDER — INSULIN GLARGINE 100 UNIT/ML ~~LOC~~ SOLN
10.0000 [IU] | Freq: Every day | SUBCUTANEOUS | Status: DC
  Administered 2016-02-20 – 2016-02-22 (×3): 10 [IU] via SUBCUTANEOUS
  Filled 2016-02-20 (×3): qty 0.1

## 2016-02-20 MED ORDER — HYDROCODONE-ACETAMINOPHEN 5-325 MG PO TABS
1.0000 | ORAL_TABLET | Freq: Four times a day (QID) | ORAL | Status: DC | PRN
  Administered 2016-02-20: 2 via ORAL
  Administered 2016-02-20: 1 via ORAL
  Administered 2016-02-21 (×3): 2 via ORAL
  Administered 2016-02-22: 1 via ORAL
  Filled 2016-02-20: qty 1
  Filled 2016-02-20 (×4): qty 2
  Filled 2016-02-20: qty 1

## 2016-02-20 MED ORDER — METOCLOPRAMIDE HCL 5 MG PO TABS: 5.0000 mg | ORAL_TABLET | Freq: Three times a day (TID) | ORAL | Status: DC | PRN

## 2016-02-20 MED ORDER — ACETAMINOPHEN 650 MG RE SUPP: 650.0000 mg | Freq: Four times a day (QID) | RECTAL | Status: DC | PRN

## 2016-02-20 MED ORDER — PHENYLEPHRINE HCL 10 MG/ML IJ SOLN
INTRAMUSCULAR | Status: DC | PRN
  Administered 2016-02-20 (×4): 80 ug via INTRAVENOUS

## 2016-02-20 MED ORDER — FENTANYL CITRATE (PF) 100 MCG/2ML IJ SOLN
INTRAMUSCULAR | Status: AC
  Filled 2016-02-20: qty 2

## 2016-02-20 MED ORDER — CALCIUM CARBONATE-VITAMIN D 500-200 MG-UNIT PO TABS
1.0000 | ORAL_TABLET | Freq: Two times a day (BID) | ORAL | Status: DC
  Administered 2016-02-20 – 2016-02-22 (×4): 1 via ORAL
  Filled 2016-02-20 (×4): qty 1

## 2016-02-20 MED ORDER — ACETAMINOPHEN 325 MG PO TABS
650.0000 mg | ORAL_TABLET | Freq: Four times a day (QID) | ORAL | Status: DC | PRN
  Administered 2016-02-21: 650 mg via ORAL
  Filled 2016-02-20: qty 2

## 2016-02-20 MED ORDER — ROCURONIUM BROMIDE 100 MG/10ML IV SOLN
INTRAVENOUS | Status: DC | PRN
  Administered 2016-02-20: 30 mg via INTRAVENOUS

## 2016-02-20 MED ORDER — FENTANYL CITRATE (PF) 250 MCG/5ML IJ SOLN
INTRAMUSCULAR | Status: DC | PRN
  Administered 2016-02-20: 50 ug via INTRAVENOUS
  Administered 2016-02-20: 100 ug via INTRAVENOUS

## 2016-02-20 MED ORDER — OXYCODONE HCL 5 MG/5ML PO SOLN: 5.0000 mg | Freq: Once | ORAL | Status: DC | PRN

## 2016-02-20 MED ORDER — ONDANSETRON HCL 4 MG/2ML IJ SOLN
INTRAMUSCULAR | Status: DC | PRN
  Administered 2016-02-20: 4 mg via INTRAVENOUS

## 2016-02-20 MED ORDER — ONDANSETRON HCL 4 MG/2ML IJ SOLN
4.0000 mg | Freq: Four times a day (QID) | INTRAMUSCULAR | Status: DC | PRN
  Administered 2016-02-21: 4 mg via INTRAVENOUS
  Filled 2016-02-20: qty 2

## 2016-02-20 MED ORDER — METHOCARBAMOL 1000 MG/10ML IJ SOLN
500.0000 mg | Freq: Four times a day (QID) | INTRAMUSCULAR | Status: DC | PRN
  Filled 2016-02-20: qty 5

## 2016-02-20 MED ORDER — PHENOL 1.4 % MT LIQD
1.0000 | OROMUCOSAL | Status: DC | PRN
Start: 2016-02-20 — End: 2016-02-22

## 2016-02-20 SURGICAL SUPPLY — 58 items
BANDAGE ELASTIC 4 VELCRO ST LF (GAUZE/BANDAGES/DRESSINGS) IMPLANT
BANDAGE ELASTIC 6 VELCRO ST LF (GAUZE/BANDAGES/DRESSINGS) IMPLANT
BANDAGE ESMARK 6X9 LF (GAUZE/BANDAGES/DRESSINGS) IMPLANT
BIT DRILL LONG 4.0 (BIT) ×1 IMPLANT
BIT DRILL SHORT 4.0 (BIT) ×2 IMPLANT
BNDG ESMARK 6X9 LF (GAUZE/BANDAGES/DRESSINGS)
COVER MAYO STAND STRL (DRAPES) IMPLANT
COVER SURGICAL LIGHT HANDLE (MISCELLANEOUS) ×3 IMPLANT
CUFF TOURNIQUET SINGLE 34IN LL (TOURNIQUET CUFF) IMPLANT
DRAPE C-ARM 42X72 X-RAY (DRAPES) ×3 IMPLANT
DRAPE C-ARMOR (DRAPES) ×3 IMPLANT
DRAPE EXTREMITY T 121X128X90 (DRAPE) IMPLANT
DRAPE IMP U-DRAPE 54X76 (DRAPES) ×3 IMPLANT
DRAPE ORTHO SPLIT 77X108 STRL (DRAPES) ×4
DRAPE POUCH INSTRU U-SHP 10X18 (DRAPES) IMPLANT
DRAPE SURG ORHT 6 SPLT 77X108 (DRAPES) ×2 IMPLANT
DRAPE U-SHAPE 47X51 STRL (DRAPES) ×3 IMPLANT
DRAPE UTILITY XL STRL (DRAPES) IMPLANT
DRILL BIT LONG 4.0 (BIT) ×3
DRILL BIT SHORT 4.0 (BIT) ×4
DRSG MEPILEX BORDER 4X4 (GAUZE/BANDAGES/DRESSINGS) ×3 IMPLANT
DRSG MEPILEX BORDER 4X8 (GAUZE/BANDAGES/DRESSINGS) ×3 IMPLANT
DURAPREP 26ML APPLICATOR (WOUND CARE) ×3 IMPLANT
ELECT CAUTERY BLADE 6.4 (BLADE) ×3 IMPLANT
ELECT REM PT RETURN 9FT ADLT (ELECTROSURGICAL) ×3
ELECTRODE REM PT RTRN 9FT ADLT (ELECTROSURGICAL) ×1 IMPLANT
FACESHIELD WRAPAROUND (MASK) IMPLANT
GAUZE SPONGE 4X4 12PLY STRL (GAUZE/BANDAGES/DRESSINGS) IMPLANT
GAUZE XEROFORM 1X8 LF (GAUZE/BANDAGES/DRESSINGS) IMPLANT
GAUZE XEROFORM 5X9 LF (GAUZE/BANDAGES/DRESSINGS) ×3 IMPLANT
GLOVE SKINSENSE NS SZ7.5 (GLOVE) ×8
GLOVE SKINSENSE STRL SZ7.5 (GLOVE) ×4 IMPLANT
GOWN STRL REIN XL XLG (GOWN DISPOSABLE) ×3 IMPLANT
GUIDE PIN 3.2X343 (PIN) ×1
GUIDE PIN 3.2X343MM (PIN) ×2
GUIDE ROD 3.0 (MISCELLANEOUS) ×3
KIT BASIN OR (CUSTOM PROCEDURE TRAY) ×3 IMPLANT
MANIFOLD NEPTUNE II (INSTRUMENTS) ×3 IMPLANT
NAIL ENDCAP EXTENSION 0MM (Nail) ×3 IMPLANT
NAIL RETROGRADE 13X36 (Nail) ×3 IMPLANT
NS IRRIG 1000ML POUR BTL (IV SOLUTION) ×3 IMPLANT
PACK TOTAL JOINT (CUSTOM PROCEDURE TRAY) ×3 IMPLANT
PACK UNIVERSAL I (CUSTOM PROCEDURE TRAY) ×3 IMPLANT
PAD CAST 4YDX4 CTTN HI CHSV (CAST SUPPLIES) IMPLANT
PADDING CAST COTTON 4X4 STRL (CAST SUPPLIES)
PIN GUIDE 3.2X343MM (PIN) ×1 IMPLANT
ROD GUIDE 3.0 (MISCELLANEOUS) ×1 IMPLANT
SCREW TRIGEN LOW PROF 5.0X35 (Screw) ×6 IMPLANT
SCREW TRIGEN LOW PROF 5.0X65 (Screw) ×6 IMPLANT
SCREW TRIGEN LOW PROF 5.0X75 (Screw) ×3 IMPLANT
STAPLER SKIN PROX WIDE 3.9 (STAPLE) IMPLANT
SUT ETHILON 2 0 FS 18 (SUTURE) ×9 IMPLANT
SUT VIC AB 0 CT1 27 (SUTURE) ×2
SUT VIC AB 0 CT1 27XBRD ANTBC (SUTURE) ×1 IMPLANT
SUT VIC AB 2-0 CT1 27 (SUTURE) ×2
SUT VIC AB 2-0 CT1 TAPERPNT 27 (SUTURE) ×1 IMPLANT
TOWEL OR 17X26 10 PK STRL BLUE (TOWEL DISPOSABLE) ×6 IMPLANT
WATER STERILE IRR 1000ML POUR (IV SOLUTION) IMPLANT

## 2016-02-20 NOTE — Progress Notes (Signed)
PROGRESS NOTE                                                                                                                                                                                                             Patient Demographics:    [name], is a 65 y.o. female, DOB - 1951-07-31, NPY:[id number]  Admit date - 02/19/2016   Admitting Physician Domenic Polite, MD  Outpatient Primary MD for the patient is Myrtis Ser, MD  LOS - 1  Chief Complaint  Patient presents with  . Fall  . Leg Pain       Brief Narrative   65 year old female with history of hypertension, diabetes, osteoporosis, depression, presents with mechanical fall, and left supracondylar femur , went for surgical repair by Dr. Erlinda Hong on 7/5.   Subjective:    [name] today has, No headache, No chest pain, No abdominal pain - Complaints of left lower extremity pain  Assessment  & Plan :    Principal Problem:   Fracture, femur, distal (HCC) Active Problems:   Diabetes mellitus due to underlying condition without complications (HCC)   HTN (hypertension), benign   Fall   Depression   Closed left femoral fracture, initial encounter   Femur fracture, left (HCC)  Left distal femur fracture - Secondary to mechanical fall, management per orthopedic, status post surgical repair today by Dr. Erlinda Hong, DVT prophylaxis per Ortho.  Diabetes mellitus  - Continue oral hypoglycemic agent, will start on low-dose Lantus, continue with insulin sliding scale  HTN (hypertension), benign -Hold lisinopril, PRN hydralazine for systolic blood pressure greater than 170  Depression Resume home dose of Wellbutrin, Zoloft, Klonopin daily at bedtime  Osteoporosis -H/o Vit D defi, will start on calcium and vitamin D     Code Status : DO NOT RESUSCITATE  Family Communication  : None at bedside  Disposition Plan  :Pending PT evaluation  Consults  :  Orhto  Procedures  :  Retrograde nailing of left supracondylar femur fracture, without intercondylar extension by Dr Erlinda Hong on 7/5  DVT Prophylaxis  :  Lovenox -  Lab Results  Component Value Date   PLT 329 02/20/2016    Antibiotics  :    Anti-infectives    Start     Dose/Rate Route Frequency Ordered Stop   02/20/16 1600  vancomycin (VANCOCIN) 1,500  mg in sodium chloride 0.9 % 500 mL IVPB     1,500 mg 250 mL/hr over 120 Minutes Intravenous To [facility] 02/19/16 1954 02/20/16 1139   02/20/16 1530  vancomycin (VANCOCIN) 1,500 mg in sodium chloride 0.9 % 500 mL IVPB     1,500 mg 250 mL/hr over 120 Minutes Intravenous Every 12 hours 02/20/16 1517 02/21/16 1529        Objective:   Filed Vitals:   02/20/16 1430 02/20/16 1439 02/20/16 1445 02/20/16 1447  BP:  165/61    Pulse: 97  98 98  Temp:    98.8 F (37.1 C)  TempSrc:      Resp: 13  14 16   SpO2: 99%  94% 96%    Wt Readings from Last 3 Encounters:  07/28/13 108.863 kg (240 lb)     Intake/Output Summary (Last 24 hours) at 02/20/16 1519 Last data filed at 02/20/16 1500  Gross per 24 hour  Intake 1471.67 ml  Output   1100 ml  Net 371.67 ml     Physical Exam  Awake Alert, Oriented X 3,  Supple Neck,No JVD, No cervical lymphadenopathy appriciated.  Symmetrical Chest wall movement, Good air movement bilaterally, CTAB RRR,No Gallops,Rubs or new Murmurs, No Parasternal Heave +ve B.Sounds, Abd Soft, No tenderness, . No Cyanosis, Clubbing or edema, No new Rash or bruise      Data Review:    CBC  Recent Labs Lab 02/19/16 1514 02/19/16 1534 02/20/16 0001  WBC 15.1*  --  12.0*  HGB 10.7* 11.9* 10.3*  HCT 34.5* 35.0* 34.3*  PLT 289  --  329  MCV 92.5  --  92.7  MCH 28.7  --  27.8  MCHC 31.0  --  30.0  RDW 13.8  --  13.6  LYMPHSABS 0.8  --   --   MONOABS 0.4  --   --   EOSABS 0.0  --   --   BASOSABS 0.0  --   --     Chemistries   Recent Labs Lab 02/19/16 1514 02/19/16 1534 02/20/16 0001  NA 138 137 137  K 4.5  4.5 4.6  CL 104 104 105  CO2 22  --  26  GLUCOSE 270* 280* 139*  BUN 25* 28* 24*  CREATININE 0.85 0.80 0.94  CALCIUM 9.1  --  9.0   ------------------------------------------------------------------------------------------------------------------ No results for input(s): CHOL, HDL, LDLCALC, TRIG, CHOLHDL, LDLDIRECT in the last 72 hours.  Lab Results  Component Value Date   HGBA1C * 11/11/2007    9.0 (NOTE)   The ADA recommends the following therapeutic goals for glycemic   control related to Hgb A1C measurement:   Goal of Therapy:   < 7.0% Hgb A1C   Action Suggested:  > 8.0% Hgb A1C   Ref:  Diabetes Care, 22, Suppl. 1, 1999   ------------------------------------------------------------------------------------------------------------------  Recent Labs  02/19/16 1824  TSH 1.496   ------------------------------------------------------------------------------------------------------------------ No results for input(s): VITAMINB12, FOLATE, FERRITIN, TIBC, IRON, RETICCTPCT in the last 72 hours.  Coagulation profile No results for input(s): INR, PROTIME in the last 168 hours.  No results for input(s): DDIMER in the last 72 hours.  Cardiac Enzymes No results for input(s): CKMB, TROPONINI, MYOGLOBIN in the last 168 hours.  Invalid input(s): CK ------------------------------------------------------------------------------------------------------------------ No results found for: BNP  Inpatient Medications  Scheduled Meds: . buPROPion  300 mg Oral Daily  . Chlorhexidine Gluconate Cloth  6 each Topical Daily  . clonazePAM  1 mg Oral QHS  . [START ON  02/21/2016] enoxaparin (LOVENOX) injection  40 mg Subcutaneous Q24H  . fentaNYL      . FLUoxetine  40 mg Oral Daily  . insulin aspart  0-9 Units Subcutaneous Q4H  . mupirocin ointment  1 application Nasal BID  . oxyCODONE      . vancomycin  1,500 mg Intravenous Q12H   Continuous Infusions: . sodium chloride    . lactated ringers  10 mL/hr at 02/20/16 0953   PRN Meds:.acetaminophen **OR** acetaminophen, acetaminophen, alum & mag hydroxide-simeth, hydrALAZINE, HYDROcodone-acetaminophen, HYDROmorphone (DILAUDID) injection, menthol-cetylpyridinium **OR** phenol, methocarbamol **OR** methocarbamol (ROBAXIN)  IV, metoCLOPramide **OR** metoCLOPramide (REGLAN) injection, morphine injection, ondansetron (ZOFRAN) IV, ondansetron **OR** ondansetron (ZOFRAN) IV, oxyCODONE  Micro Results Recent Results (from the past 240 hour(s))  Surgical pcr screen     Status: Abnormal   Collection Time: 02/19/16  9:47 PM  Result Value Ref Range Status   MRSA, PCR NEGATIVE NEGATIVE Final   Staphylococcus aureus POSITIVE (A) NEGATIVE Final    Comment:        The Xpert SA Assay (FDA approved for NASAL specimens in patients over 39 years of age), is one component of a comprehensive surveillance program.  Test performance has been validated by [facility] for patients greater than or equal to 66 year old. It is not intended to diagnose infection nor to guide or monitor treatment.     Radiology Reports Dg Chest 2 View  02/19/2016  CLINICAL DATA:  Pain following fall.  Hypertension. EXAM: CHEST  2 VIEW COMPARISON:  Chest radiograph April 09, 2011 and chest CT July 28, 2013 FINDINGS: There is no edema or consolidation. Heart is upper normal in size with pulmonary vascularity within normal limits. There is atherosclerotic calcification in the aorta. There is mitral annulus calcification. There is no demonstrable adenopathy. There is evidence of old trauma with remodeling involving the proximal left humerus. There old healed rib fractures on the right. IMPRESSION: No edema or consolidation.  There is aortic atherosclerosis. Electronically Signed   By: Lowella Grip III M.D.   On: 02/19/2016 15:55   Dg Pelvis 1-2 Views  02/19/2016  CLINICAL DATA:  Patient fell today from standing position. Pt c/o severe left leg pain, left generalized arm  pain, and right toe pain. EXAM: PELVIS - 1-2 VIEW COMPARISON:  None. FINDINGS: No fracture.  No bone lesion.  Bones are demineralized. Hip joints, SI joints and symphysis pubis are normally aligned. IMPRESSION: No fracture or dislocation. Electronically Signed   By: Lajean Manes M.D.   On: 02/19/2016 14:40   Dg Forearm Left  02/19/2016  CLINICAL DATA:  Patient fell today from standing position. Pt c/o severe left leg pain, left generalized arm pain, and right toe pain. EXAM: LEFT FOREARM - 2 VIEW COMPARISON:  None. FINDINGS: No fracture.  No dislocation.  Bones are demineralized. There is soft tissue swelling along the radial aspect of the distal forearm. No radiopaque foreign body. IMPRESSION: No fracture or dislocation. Electronically Signed   By: Lajean Manes M.D.   On: 02/19/2016 14:37   Dg Tibia/fibula Left  02/19/2016  CLINICAL DATA:  Patient fell today from standing position. Pt c/o severe left leg pain, left generalized arm pain, and right toe pain. EXAM: LEFT TIBIA AND FIBULA - 2 VIEW COMPARISON:  None. FINDINGS: No fracture.  No bone lesion. There are advanced degenerative changes of the knee with marked medial compartment narrowing. Ankle joint is normally spaced and aligned. C/o Bones are demineralized. There are soft tissue and vascular  calcifications along the left leg with multiple vascular clips from previous the umbilical vein surgery. Small IMPRESSION: 1. No acute fracture or dislocation. Electronically Signed   By: Lajean Manes M.D.   On: 02/19/2016 14:39   Ct Knee Left Wo Contrast  02/19/2016  CLINICAL DATA:  Fall from standing position. Severe pain to left knee, toes on left and right feet. EXAM: CT OF THE LEFT KNEE WITHOUT CONTRAST TECHNIQUE: Multidetector CT imaging of the LEFT knee was performed according to the standard protocol. Multiplanar CT image reconstructions were also generated. COMPARISON:  None. FINDINGS: There is a markedly displaced/comminuted fracture within the distal  left femur, with significant impaction at the fracture site and anterior-lateral angulation at the fracture site. Fracture does not appear to extend to the articular surface of either femoral condyles. No fracture seen within the proximal tibia or fibula. Extensive degenerative change at the right knee, tricompartmental, with most severe joint space narrowing and osteophyte formation at the medial compartment with abutment of the medial femoral head aunt tibial plateau. Joint effusion is present, most prominent within the suprapatellar bursa, moderate in size. Superficial soft tissues are unremarkable. IMPRESSION: 1. Markedly displaced/comminuted fracture of the distal left femur, centered at the distal metaphysis, with prominent impaction at the fracture site and anterior-lateral angulation at the fracture site. Fracture does not appear to extend to the articular surface of either femoral condyle. 2. 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 3. Joint effusion. Electronically Signed   By: Franki Cabot M.D.   On: 02/19/2016 16:35   Dg Humerus Left  02/19/2016  CLINICAL DATA:  Fall.  Severe left leg pain.  Left arm pain. EXAM: LEFT HUMERUS - 2+ VIEW COMPARISON:  12/24/2008 FINDINGS: Old healed fracture within the proximal left humerus. No acute fracture. No subluxation or dislocation. IMPRESSION: No acute bony abnormality. Electronically Signed   By: Rolm Baptise M.D.   On: 02/19/2016 14:31   Dg Foot Complete Left  02/19/2016  CLINICAL DATA:  Patient fell today from standing position. Pt c/o severe left leg pain, left generalized arm pain, and right toe pain. EXAM: LEFT FOOT - COMPLETE 3+ VIEW COMPARISON:  None. FINDINGS: There is a nondisplaced, non comminuted fracture across the medial base of the proximal phalanx of the great toe. No other acute fracture.  No dislocation. Bones are diffusely demineralized.  IMPRESSION: Nondisplaced fracture of the medial base of the proximal phalanx of left great toe. Electronically Signed   By: Lajean Manes M.D.   On: 02/19/2016 14:34   Dg Foot Complete Right  02/19/2016  CLINICAL DATA:  Patient fell today from standing position. Pt c/o severe left leg pain, left generalized arm pain, and right toe pain. EXAM: RIGHT FOOT COMPLETE - 3+ VIEW COMPARISON:  09/30/2013 FINDINGS: There is nondisplaced fracture across the lateral base of the proximal phalanx of the great toe. No other acute fractures. Old healed fracture of the proximal first metatarsal. Joints are normally aligned. Bones are diffusely demineralized. IMPRESSION: 1. Nondisplaced fracture across the lateral base of the proximal phalanx of the right great toe. No other fractures. No dislocation. Electronically Signed   By: Lajean Manes M.D.   On: 02/19/2016 14:36   Dg C-arm 1-60 Min  02/20/2016  CLINICAL DATA:  Post left femur fracture repair EXAM: LEFT FEMUR 2 VIEWS; DG C-ARM 61-120 MIN COMPARISON:  02/19/2016 FINDINGS: The patient is status post open reduction  internal fixation of distal left femoral fracture. There is intra medullary rod in left femur. At least three locking screws are noted in distal left femur. There is improvement with near anatomic alignment. IMPRESSION: Status post intraoperative repair of distal left femoral fracture. Intra medullary rod and locking screws are noted in left femur. There is improvement near anatomic alignment. Fluoroscopy time was 3 minutes 12 seconds. Please see the operative report. Electronically Signed   By: Lahoma Crocker M.D.   On: 02/20/2016 12:37   Dg Femur Min 2 Views Left  02/20/2016  CLINICAL DATA:  Post left femur fracture repair EXAM: LEFT FEMUR 2 VIEWS; DG C-ARM 61-120 MIN COMPARISON:  02/19/2016 FINDINGS: The patient is status post open reduction internal fixation of distal left femoral fracture. There is intra medullary rod in left femur. At least three locking screws  are noted in distal left femur. There is improvement with near anatomic alignment. IMPRESSION: Status post intraoperative repair of distal left femoral fracture. Intra medullary rod and locking screws are noted in left femur. There is improvement near anatomic alignment. Fluoroscopy time was 3 minutes 12 seconds. Please see the operative report. Electronically Signed   By: Lahoma Crocker M.D.   On: 02/20/2016 12:37   Dg Femur Min 2 Views Left  02/19/2016  CLINICAL DATA:  Patient fell today from standing position. Pt c/o severe left leg pain, left generalized arm pain, and right toe pain. EXAM: LEFT FEMUR 2 VIEWS COMPARISON:  None. FINDINGS: There is an oblique fracture of the distal femur. Fracture is mildly comminuted. Fracture is also mildly displaced with the distal primary fracture component telescoping on the shaft component by 2.7 cm. There is approximate 1 cm of lateral displacement. There is mild posterior angulation. No other fracture.  No dislocation. Bones are demineralized. IMPRESSION: 1. Fracture of the distal left femur as described.  No dislocation. Electronically Signed   By: Lajean Manes M.D.   On: 02/19/2016 14:33     Mikeila Burgen M.D on 02/20/2016 at 3:19 PM  Between 7am to 7pm - Pager - [phone]  After 7pm go to [email or web address] - password [facility]  [facility] -  Office  [phone]

## 2016-02-20 NOTE — Anesthesia Procedure Notes (Signed)
Procedure Name: Intubation Date/Time: 02/20/2016 10:40 AM Performed by: Mariea Clonts Pre-anesthesia Checklist: Emergency Drugs available, Patient identified, Suction available, Patient being monitored and Timeout performed Patient Re-evaluated:Patient Re-evaluated prior to inductionOxygen Delivery Method: Circle system utilized Preoxygenation: Pre-oxygenation with 100% oxygen Intubation Type: IV induction and Cricoid Pressure applied Laryngoscope Size: Miller and 2 Grade View: Grade II Tube size: 7.5 mm Number of attempts: 1 Placement Confirmation: ETT inserted through vocal cords under direct vision,  positive ETCO2 and breath sounds checked- equal and bilateral Tube secured with: Tape Dental Injury: Teeth and Oropharynx as per pre-operative assessment

## 2016-02-20 NOTE — Anesthesia Postprocedure Evaluation (Signed)
Anesthesia Post Note  Patient: [name]  Procedure(s) Performed: Procedure(s) (LRB): INTRAMEDULLARY (IM) RETROGRADE FEMORAL NAILING (Left)  Patient location during evaluation: PACU Anesthesia Type: General Level of consciousness: sedated Pain management: pain level controlled Vital Signs Assessment: post-procedure vital signs reviewed and stable Respiratory status: spontaneous breathing and respiratory function stable Cardiovascular status: stable Anesthetic complications: no    Last Vitals:  Filed Vitals:   02/20/16 1300 02/20/16 1315  BP: 150/85 171/74  Pulse: 93 94  Temp:    Resp: 9 14    Last Pain:  Filed Vitals:   02/20/16 1319  PainSc: 5                  Angeliz Settlemyre DANIEL

## 2016-02-20 NOTE — Op Note (Signed)
   Date of Surgery: 02/20/2016  INDICATIONS: [name] is a 65 y.o.-year-old female with a left supracondylar femur fracture;  The patient did consent to the procedure after discussion of the risks and benefits.  PREOPERATIVE DIAGNOSIS: Left supracondylar femur fracture  POSTOPERATIVE DIAGNOSIS: Same.  PROCEDURE: Retrograde nailing of left supracondylar femur fracture, without intercondylar extension  SURGEON: N. Eduard Roux, M.D.  ASSIST: Ky Barban, RNFA.  ANESTHESIA:  general  IV FLUIDS AND URINE: See anesthesia.  ESTIMATED BLOOD LOSS: 200 mL.  IMPLANTS: [employer] 13 x 36 retrograde femoral nail  DRAINS: none  COMPLICATIONS: None.  DESCRIPTION OF PROCEDURE: The patient was brought to the operating room and placed supine on the operating table.  The patient had been signed prior to the procedure and this was documented. The patient had the anesthesia placed by the anesthesiologist.  A time-out was performed to confirm that this was the correct patient, site, side and location. The patient did receive antibiotics prior to the incision and was re-dosed during the procedure as needed at indicated intervals.  A tourniquet not placed.  The patient had the operative extremity prepped and draped in the standard surgical fashion.    With the patient in the supine position we made a anterior incision over the patellar tendon. Full-thickness flaps were elevated. The tendon was split in line with the incision. A Gelpi retractor was then placed. The infrapatellar fat pad was excised. A guide pin was then used to find the appropriate start site using fluoroscopy. We then advanced the guidepin up the distal femur. We then used a opening reamer in order to gain entry into the femoral canal. We then obtained a reduction of the fracture mainly with longitudinal traction. With the fracture held in place the guide wire was placed up the femoral canal up to the appropriate depth. The length of the  nail was measured to 36 cm. We then sequentially reamed up to a 14.5 mm reamer with appropriate chatter. We then placed the nail over the guidewire to the appropriate depth. We then placed 3 distal interlocking screws through the jig. Of note she had extremely poor bone quality. A distal set screw was placed in the nail in order to lock the screws down into a fixed angle construct. We then placed 2 proximal interlocking screws using the perfect circles technique. Final x-rays were taken. The wounds were thoroughly irrigated and closed in a layered fashion using 0 Vicryl, 2-0 Vicryl, 2-0 nylon. Sterile dressings were applied. Patient tolerated procedure well.  POSTOPERATIVE PLAN: She will be nonweightbearing to the left lower extremity.  Azucena Cecil, MD [facility] [phone] 12:09 PM

## 2016-02-20 NOTE — Consult Note (Signed)
ORTHOPAEDIC CONSULTATION  REQUESTING PHYSICIAN: Albertine Patricia, MD  Chief Complaint: Left distal femur fracture  HPI: [name] is a 65 y.o. female who presents with left distal femur fracture s/p mechanical fall.  The patient endorses severe pain in the left knee region, that does not radiate, grinding in quality, worse with any movement, better with immobilization.  Denies LOC/fever/chills/nausea/vomiting.  Walks without assistive devices (walker, cane, wheelchair).  Does live independently.  Past Medical History  Diagnosis Date  . Diabetes mellitus without complication ([place])   . Hypertension   . Depression   . Osteoporosis    Past Surgical History  Procedure Laterality Date  . Orthopedic surgery    . Abdominal hysterectomy    . Cesarean section    . Cholecystectomy    . Pancreas surgery     Social History   Social History  . Marital Status: Married    Spouse Name: N/A  . Number of Children: N/A  . Years of Education: N/A   Social History Main Topics  . Smoking status: Never Smoker   . Smokeless tobacco: None  . Alcohol Use: No  . Drug Use: None  . Sexual Activity: Not Asked   Other Topics Concern  . None   Social History Narrative   No family history on file. No Known Allergies Prior to Admission medications   Medication Sig Start Date End Date Taking? Authorizing Provider  acetaminophen (TYLENOL) 325 MG tablet Take 650 mg by mouth every 6 (six) hours as needed for mild pain.   Yes Historical Provider, MD  buPROPion (WELLBUTRIN XL) 300 MG 24 hr tablet Take 300 mg by mouth daily. 07/06/13  Yes Historical Provider, MD  clonazePAM (KLONOPIN) 1 MG tablet Take 1 mg by mouth at bedtime. 07/09/13  Yes Historical Provider, MD  FLUoxetine (PROZAC) 20 MG capsule Take 40 mg by mouth daily.  06/27/13  Yes Historical Provider, MD  glipiZIDE (GLUCOTROL) 10 MG tablet Take 10 mg by mouth 2 (two) times daily. 05/24/13  Yes Historical Provider, MD  lisinopril  (PRINIVIL,ZESTRIL) 20 MG tablet Take 20 mg by mouth daily. 06/09/13  Yes Historical Provider, MD  meloxicam (MOBIC) 7.5 MG tablet Take 7.5 mg by mouth daily as needed for pain.  05/05/13  Yes Historical Provider, MD  metFORMIN (GLUCOPHAGE) 1000 MG tablet Take 1,000 mg by mouth 2 (two) times daily. 05/05/13  Yes Historical Provider, MD  pioglitazone (ACTOS) 45 MG tablet Take 45 mg by mouth daily. 05/24/13  Yes Historical Provider, MD  Vitamin D, Ergocalciferol, (DRISDOL) 50000 units CAPS capsule Take 50,000 Units by mouth every 7 (seven) days.   Yes Historical Provider, MD  HYDROcodone-acetaminophen (NORCO/VICODIN) 5-325 MG per tablet Take 1 tablet by mouth every 6 (six) hours as needed for moderate pain. Patient not taking: Reported on 02/19/2016 07/28/13   Evelina Bucy, MD   Dg Chest 2 View  02/19/2016  CLINICAL DATA:  Pain following fall.  Hypertension. EXAM: CHEST  2 VIEW COMPARISON:  Chest radiograph April 09, 2011 and chest CT July 28, 2013 FINDINGS: There is no edema or consolidation. Heart is upper normal in size with pulmonary vascularity within normal limits. There is atherosclerotic calcification in the aorta. There is mitral annulus calcification. There is no demonstrable adenopathy. There is evidence of old trauma with remodeling involving the proximal left humerus. There old healed rib fractures on the right. IMPRESSION: No edema or consolidation.  There is aortic atherosclerosis. Electronically Signed   By: Lowella Grip III  M.D.   On: 02/19/2016 15:55   Dg Pelvis 1-2 Views  02/19/2016  CLINICAL DATA:  Patient fell today from standing position. Pt c/o severe left leg pain, left generalized arm pain, and right toe pain. EXAM: PELVIS - 1-2 VIEW COMPARISON:  None. FINDINGS: No fracture.  No bone lesion.  Bones are demineralized. Hip joints, SI joints and symphysis pubis are normally aligned. IMPRESSION: No fracture or dislocation. Electronically Signed   By: Lajean Manes M.D.   On:  02/19/2016 14:40   Dg Forearm Left  02/19/2016  CLINICAL DATA:  Patient fell today from standing position. Pt c/o severe left leg pain, left generalized arm pain, and right toe pain. EXAM: LEFT FOREARM - 2 VIEW COMPARISON:  None. FINDINGS: No fracture.  No dislocation.  Bones are demineralized. There is soft tissue swelling along the radial aspect of the distal forearm. No radiopaque foreign body. IMPRESSION: No fracture or dislocation. Electronically Signed   By: Lajean Manes M.D.   On: 02/19/2016 14:37   Dg Tibia/fibula Left  02/19/2016  CLINICAL DATA:  Patient fell today from standing position. Pt c/o severe left leg pain, left generalized arm pain, and right toe pain. EXAM: LEFT TIBIA AND FIBULA - 2 VIEW COMPARISON:  None. FINDINGS: No fracture.  No bone lesion. There are advanced degenerative changes of the knee with marked medial compartment narrowing. Ankle joint is normally spaced and aligned. C/o Bones are demineralized. There are soft tissue and vascular calcifications along the left leg with multiple vascular clips from previous the umbilical vein surgery. Small IMPRESSION: 1. No acute fracture or dislocation. Electronically Signed   By: Lajean Manes M.D.   On: 02/19/2016 14:39   Ct Knee Left Wo Contrast  02/19/2016  CLINICAL DATA:  Fall from standing position. Severe pain to left knee, toes on left and right feet. EXAM: CT OF THE LEFT KNEE WITHOUT CONTRAST TECHNIQUE: Multidetector CT imaging of the LEFT knee was performed according to the standard protocol. Multiplanar CT image reconstructions were also generated. COMPARISON:  None. FINDINGS: There is a markedly displaced/comminuted fracture within the distal left femur, with significant impaction at the fracture site and anterior-lateral angulation at the fracture site. Fracture does not appear to extend to the articular surface of either femoral condyles. No fracture seen within the proximal tibia or fibula. Extensive degenerative change at the  right knee, tricompartmental, with most severe joint space narrowing and osteophyte formation at the medial compartment with abutment of the medial femoral head aunt tibial plateau. Joint effusion is present, most prominent within the suprapatellar bursa, moderate in size. Superficial soft tissues are unremarkable. IMPRESSION: 1. Markedly displaced/comminuted fracture of the distal left femur, centered at the distal metaphysis, with prominent impaction at the fracture site and anterior-lateral angulation at the fracture site. Fracture does not appear to extend to the articular surface of either femoral condyle. 2. 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 3. Joint effusion. Electronically Signed   By: Franki Cabot M.D.   On: 02/19/2016 16:35   Dg Humerus Left  02/19/2016  CLINICAL DATA:  Fall.  Severe left leg pain.  Left arm pain. EXAM: LEFT HUMERUS - 2+ VIEW COMPARISON:  12/24/2008 FINDINGS: Old healed fracture within the proximal left humerus. No acute fracture. No subluxation or dislocation. IMPRESSION: No acute bony abnormality. Electronically Signed   By: Rolm Baptise M.D.   On: 02/19/2016 14:31   Dg  Foot Complete Left  02/19/2016  CLINICAL DATA:  Patient fell today from standing position. Pt c/o severe left leg pain, left generalized arm pain, and right toe pain. EXAM: LEFT FOOT - COMPLETE 3+ VIEW COMPARISON:  None. FINDINGS: There is a nondisplaced, non comminuted fracture across the medial base of the proximal phalanx of the great toe. No other acute fracture.  No dislocation. Bones are diffusely demineralized. IMPRESSION: Nondisplaced fracture of the medial base of the proximal phalanx of left great toe. Electronically Signed   By: Lajean Manes M.D.   On: 02/19/2016 14:34   Dg Foot Complete Right  02/19/2016  CLINICAL DATA:  Patient fell today from standing position. Pt c/o severe left leg pain,  left generalized arm pain, and right toe pain. EXAM: RIGHT FOOT COMPLETE - 3+ VIEW COMPARISON:  09/30/2013 FINDINGS: There is nondisplaced fracture across the lateral base of the proximal phalanx of the great toe. No other acute fractures. Old healed fracture of the proximal first metatarsal. Joints are normally aligned. Bones are diffusely demineralized. IMPRESSION: 1. Nondisplaced fracture across the lateral base of the proximal phalanx of the right great toe. No other fractures. No dislocation. Electronically Signed   By: Lajean Manes M.D.   On: 02/19/2016 14:36   Dg Femur Min 2 Views Left  02/19/2016  CLINICAL DATA:  Patient fell today from standing position. Pt c/o severe left leg pain, left generalized arm pain, and right toe pain. EXAM: LEFT FEMUR 2 VIEWS COMPARISON:  None. FINDINGS: There is an oblique fracture of the distal femur. Fracture is mildly comminuted. Fracture is also mildly displaced with the distal primary fracture component telescoping on the shaft component by 2.7 cm. There is approximate 1 cm of lateral displacement. There is mild posterior angulation. No other fracture.  No dislocation. Bones are demineralized. IMPRESSION: 1. Fracture of the distal left femur as described.  No dislocation. Electronically Signed   By: Lajean Manes M.D.   On: 02/19/2016 14:33    All pertinent xrays, MRI, CT independently reviewed and interpreted  Positive ROS: All other systems have been reviewed and were otherwise negative with the exception of those mentioned in the HPI and as above.  Physical Exam: General: Alert, no acute distress Cardiovascular: No pedal edema Respiratory: No cyanosis, no use of accessory musculature GI: No organomegaly, abdomen is soft and non-tender Skin: No lesions in the area of chief complaint Neurologic: Sensation intact distally Psychiatric: Patient is competent for consent with normal mood and affect Lymphatic: No axillary or cervical  lymphadenopathy  MUSCULOSKELETAL:  - severe pain with movement of the hip and extremity - skin intact - NVI distally - compartments soft  Assessment: Left distal femur fracture  Plan: - surgery is recommended, patient and family are aware of r/b/a and wish to proceed - consent obtained - medical optimization per primary team - surgery is planned for today - Based on history and fracture pattern this likely represents a fragility fracture. - Fragility fractures affect up to one half of women and one third of men after age 36 years and occur in the setting of bone disorder such as osteoporosis or osteopenia and warrant appropriate work-up. - The following are general recommendations that may serve as an outline for an appropriate work-up:  1.) Obtain bone density measurement to confirm presumptive diagnosis, assess severity of osteoporosis and risk of future fracture, and use as baseline for monitoring treatment  2.) Obtain laboratory tests: CBC, ESR, serum calcium, creatinine, albumin,phosphate, alkaline phosphatase, liver  transaminases, protein electrophoresis, urinalysis, 25-hydroxyvitamin D.  3.) Exclude secondary causes of low bone mass and skeletal fragility (eg,multiple myeloma, lymphoma) as indicated.  4.) Obtain radiograph of thoracic and lumbar spine, particularly among individuals with back pain or height loss to assess presence of vertebral fractures  5.) Intermittent administration of recombinant human parathyroid hormone  6.) Optimize nutritional status using nutritional supplementation.  7.) Patient/family education to prevent future falls.  8.) Early mobilization and exercise program - exercise decreases the rate of bone loss and has been associated with decreased rate of fragility fractures   Thank you for the consult and the opportunity to see Ms. [name]. Eduard Roux, MD [place] 7:39 AM

## 2016-02-20 NOTE — Progress Notes (Signed)
Inpatient Diabetes Program Recommendations  AACE/ADA: New Consensus Statement on Inpatient Glycemic Control (2015)  Target Ranges:  Prepandial:   less than 140 mg/dL      Peak postprandial:   less than 180 mg/dL (1-2 hours)      Critically ill patients:  140 - 180 mg/dL    Review of Glycemic ControlResults for [name], [name] (MRN [id number]) as of 02/20/2016 11:34  Ref. Range 02/19/2016 17:53 02/19/2016 21:58 02/20/2016 00:02 02/20/2016 04:50 02/20/2016 08:55  Glucose-Capillary Latest Ref Range: 65-99 mg/dL 277 (H) 155 (H) 135 (H) 252 (H) 230 (H)   Diabetes history: Type 2 Diabetes Outpatient Diabetes medications: Glucotrol 10 mg bid, Metformin 1000 mg bid, Actos 45 mg daily Current orders for Inpatient glycemic control:  Novolog sensitive q 4 hours  Inpatient Diabetes Program Recommendations:    May consider adding Lantus 15 units daily while patient is in the hospital to control blood sugars.   Thanks, Adah Perl, RN, BC-ADM Inpatient Diabetes Coordinator Pager [phone] (8a-5p)

## 2016-02-20 NOTE — Transfer of Care (Signed)
Immediate Anesthesia Transfer of Care Note  Patient: [name]  Procedure(s) Performed: Procedure(s): INTRAMEDULLARY (IM) RETROGRADE FEMORAL NAILING (Left)  Patient Location: PACU  Anesthesia Type:General  Level of Consciousness: awake, alert  and oriented  Airway & Oxygen Therapy: Patient Spontanous Breathing and Patient connected to nasal cannula oxygen  Post-op Assessment: Report given to RN and Post -op Vital signs reviewed and stable  Post vital signs: Reviewed and stable  Last Vitals:  Filed Vitals:   02/19/16 2100 02/20/16 0450  BP: 168/65 136/64  Pulse: 98 95  Temp: 36.9 C 36.5 C  Resp: 18 18    Last Pain:  Filed Vitals:   02/20/16 0454  PainSc: Asleep      Patients Stated Pain Goal: 2 (21/11/73 5670)  Complications: No apparent anesthesia complications

## 2016-02-20 NOTE — Anesthesia Preprocedure Evaluation (Signed)
Anesthesia Evaluation  Patient identified by MRN, date of birth, ID band Patient awake    Reviewed: Allergy & Precautions, NPO status , Patient's Chart, lab work & pertinent test results  Airway        Dental   Pulmonary           Cardiovascular hypertension,      Neuro/Psych    GI/Hepatic   Endo/Other  diabetes  Renal/GU      Musculoskeletal   Abdominal   Peds  Hematology   Anesthesia Other Findings   Reproductive/Obstetrics                             Anesthesia Physical Anesthesia Plan  ASA: III  Anesthesia Plan: General   Post-op Pain Management:    Induction: Intravenous  Airway Management Planned: Oral ETT  Additional Equipment:   Intra-op Plan:   Post-operative Plan: Extubation in OR  Informed Consent: I have reviewed the patients History and Physical, chart, labs and discussed the procedure including the risks, benefits and alternatives for the proposed anesthesia with the patient or authorized representative who has indicated his/her understanding and acceptance.     Plan Discussed with: CRNA and Anesthesiologist  Anesthesia Plan Comments:         Anesthesia Quick Evaluation

## 2016-02-21 ENCOUNTER — Encounter (HOSPITAL_COMMUNITY): Payer: Self-pay | Admitting: Orthopaedic Surgery

## 2016-02-21 DIAGNOSIS — S72402D Unspecified fracture of lower end of left femur, subsequent encounter for closed fracture with routine healing: Secondary | ICD-10-CM

## 2016-02-21 DIAGNOSIS — N179 Acute kidney failure, unspecified: Secondary | ICD-10-CM

## 2016-02-21 LAB — BASIC METABOLIC PANEL
Anion gap: 6 (ref 5–15)
BUN: 38 mg/dL — AB (ref 6–20)
CHLORIDE: 106 mmol/L (ref 101–111)
CO2: 23 mmol/L (ref 22–32)
CREATININE: 1.58 mg/dL — AB (ref 0.44–1.00)
Calcium: 8.4 mg/dL — ABNORMAL LOW (ref 8.9–10.3)
GFR calc Af Amer: 39 mL/min — ABNORMAL LOW (ref >60)
GFR calc non Af Amer: 33 mL/min — ABNORMAL LOW (ref >60)
GLUCOSE: 188 mg/dL — AB (ref 65–99)
POTASSIUM: 4.7 mmol/L (ref 3.5–5.1)
SODIUM: 135 mmol/L (ref 135–145)

## 2016-02-21 LAB — GLUCOSE, CAPILLARY
GLUCOSE-CAPILLARY: 138 mg/dL — AB (ref 65–99)
GLUCOSE-CAPILLARY: 131 mg/dL — AB (ref 65–99)
Glucose-Capillary: 185 mg/dL — ABNORMAL HIGH (ref 65–99)
Glucose-Capillary: 167 mg/dL — ABNORMAL HIGH (ref 65–99)

## 2016-02-21 LAB — CBC
HEMATOCRIT: 29.3 % — AB (ref 36.0–46.0)
HEMOGLOBIN: 8.8 g/dL — AB (ref 12.0–15.0)
MCH: 27.7 pg (ref 26.0–34.0)
MCHC: 30 g/dL (ref 30.0–36.0)
MCV: 92.1 fL (ref 78.0–100.0)
Platelets: 244 10*3/uL (ref 150–400)
RBC: 3.18 MIL/uL — ABNORMAL LOW (ref 3.87–5.11)
RDW: 13.7 % (ref 11.5–15.5)
WBC: 11.9 10*3/uL — ABNORMAL HIGH (ref 4.0–10.5)

## 2016-02-21 MED ORDER — SODIUM CHLORIDE 0.9 % IV SOLN: INTRAVENOUS | Status: DC

## 2016-02-21 MED ORDER — SODIUM CHLORIDE 0.9 % IV SOLN
INTRAVENOUS | Status: AC
  Administered 2016-02-21 (×2): via INTRAVENOUS

## 2016-02-21 MED ORDER — SODIUM CHLORIDE 0.9 % IV SOLN
INTRAVENOUS | Status: AC
  Administered 2016-02-21 (×2): via INTRAVENOUS

## 2016-02-21 NOTE — Evaluation (Signed)
Physical Therapy Evaluation Patient Details Name: [name] MRN: [id number] DOB: [birth year] Today's Date: 02/21/2016   History of Present Illness  65 y.o. female who presents with left distal femur fracture due to mechanical fall, now s/p retrograde nail. Pt also sustaining bilateral great toe fractures. QQV:[id number], hypertension, depression.    Clinical Impression  Pt mobilizing very slowly during initial PT session. Pt needing mod assist with bed mobility and despite multiple attempts, but unable to achieve standing. Based upon the patient's current mobility level, recommending SNF for further rehabilitation. Pt states that she needs to be able to go home to take care of her 49 y.o. Granddaughter and her husband is still working full time. PT to continue to work with pt to progress mobility to maximize independence and mobility.     Follow Up Recommendations SNF;Supervision for mobility/OOB    Equipment Recommendations  Other (comment), if pt refuses SNF anticipate she would need for w/c, rw, 3:1 BSC (will continue to assess)   Recommendations for Other Services       Precautions / Restrictions Precautions Precautions: Fall Precaution Comments: bilateral great toe fractures Required Braces or Orthoses: Knee Immobilizer - Left Restrictions Weight Bearing Restrictions: Yes LLE Weight Bearing: Non weight bearing      Mobility  Bed Mobility Overal bed mobility: Needs Assistance Bed Mobility: Supine to Sit;Sit to Supine     Supine to sit: Mod assist;HOB elevated Sit to supine: Mod assist   General bed mobility comments: Pt moving slowly with bed mobility, assist needed also at trunk to get fully to sitting. Attempted lateral scooting at EOB, pt unable to perform.   Transfers Overall transfer level: Needs assistance Equipment used: Rolling walker (2 wheeled) Transfers: Sit to/from Stand Sit to Stand: Max assist         General transfer comment: Attempted sit<>stand X4  but pt unable to lift buttock from bed.   Ambulation/Gait             General Gait Details: unable to attempt  Stairs            Wheelchair Mobility    Modified Rankin (Stroke Patients Only)       Balance Overall balance assessment: Needs assistance Sitting-balance support: No upper extremity supported Sitting balance-Leahy Scale: Good Sitting balance - Comments: mild instability initially but improving with time.                                      Pertinent Vitals/Pain Pain Assessment: 0-10 Pain Score: 8  Pain Location: Lt LE Pain Descriptors / Indicators: Aching Pain Intervention(s): Limited activity within patient's tolerance;Monitored during session    Home Living Family/patient expects to be discharged to:: Private residence Living Arrangements: Spouse/significant other;Other (Comment) (46 y.o. granddaughter) Available Help at Discharge: Family;Available PRN/intermittently Type of Home: Mobile home Home Access: Stairs to enter Entrance Stairs-Rails: Right Entrance Stairs-Number of Steps: 3 Home Layout: One level Home Equipment: Cane - single point Additional Comments: spouse works full time (home around 3:00 pm).     Prior Function Level of Independence: Independent         Comments: occasional use of cane     Hand Dominance        Extremity/Trunk Assessment   Upper Extremity Assessment: Defer to OT evaluation           Lower Extremity Assessment: LLE deficits/detail;RLE deficits/detail RLE Deficits / Details: generalized  weakness through Rt LE LLE Deficits / Details: assist required for moving LLE with all bed mobility     Communication   Communication: No difficulties  Cognition Arousal/Alertness: Awake/alert Behavior During Therapy: WFL for tasks assessed/performed Overall Cognitive Status: Within Functional Limits for tasks assessed                      General Comments      Exercises         Assessment/Plan    PT Assessment Patient needs continued PT services  PT Diagnosis Difficulty walking;Generalized weakness   PT Problem List Decreased strength;Decreased range of motion;Decreased activity tolerance;Decreased balance;Decreased mobility  PT Treatment Interventions DME instruction;Gait training;Stair training;Functional mobility training;Therapeutic activities;Therapeutic exercise;Patient/family education;Wheelchair mobility training   PT Goals (Current goals can be found in the Care Plan section) Acute Rehab PT Goals Patient Stated Goal: get home PT Goal Formulation: With patient Time For Goal Achievement: 03/06/16 Potential to Achieve Goals: Fair    Frequency Min 5X/week   Barriers to discharge Decreased caregiver support;Inaccessible home environment      Co-evaluation               End of Session Equipment Utilized During Treatment: Gait belt;Left knee immobilizer Activity Tolerance: Patient tolerated treatment well Patient left: in bed;with call bell/phone within reach;with SCD's reapplied Nurse Communication: Mobility status;Weight bearing status         Time: 2162-4469 PT Time Calculation (min) (ACUTE ONLY): 52 min   Charges:   PT Evaluation $PT Eval Moderate Complexity: 1 Procedure PT Treatments $Therapeutic Activity: 23-37 mins   PT G Codes:        Cassell Clement, PT, CSCS Pager [phone] Office [phone]  02/21/2016, 9:34 AM

## 2016-02-21 NOTE — Progress Notes (Signed)
   02/21/16 0950  Clinical Encounter Type  Visited With Patient  Visit Type Initial  Chaplain visited patient on morning rounds.  Patient shared she had surgery yesterday.  Chaplain inquired if there were any concerns that Chaplain could assist with.  Patient shared she did not have a phone.  Chaplain informed Network engineer of the patient's desire to have a phone.  Secretary will order one.  Chaplain relayed the information to patient and made further support available if needed.

## 2016-02-21 NOTE — Progress Notes (Signed)
Lab called, they will have to re-draw BMET this morning due to blood clotting in sample received.

## 2016-02-21 NOTE — Care Management Important Message (Signed)
Important Message  Patient Details  Name: [name] MRN: [id number] Date of Birth: [birth year]   Medicare Important Message Given:  Yes    Loann Quill 02/21/2016, 9:03 AM

## 2016-02-21 NOTE — Progress Notes (Signed)
PROGRESS NOTE                                                                                                                                                                                                             Patient Demographics:    [name], is a 65 y.o. female, DOB - 05-23-1951, BLT:[id number]  Admit date - 02/19/2016   Admitting Physician Domenic Polite, MD  Outpatient Primary MD for the patient is Myrtis Ser, MD  LOS - 2  Chief Complaint  Patient presents with  . Fall  . Leg Pain       Brief Narrative   65 year old female with history of hypertension, diabetes, osteoporosis, depression, presents with mechanical fall, and left supracondylar femur , went for surgical repair by Dr. Erlinda Hong on 7/5.   Subjective:    [name] today has, No headache, No chest pain, No abdominal pain -  left lower extremity painIs better controlled  Assessment  & Plan :    Principal Problem:   Fracture, femur, distal ([place]) Active Problems:   Diabetes mellitus due to underlying condition without complications (HCC)   HTN (hypertension), benign   Fall   Depression   Closed left femoral fracture, initial encounter   Femur fracture, left (HCC)  Left distal femur fracture - Secondary to mechanical fall, management per orthopedic, status post surgical repair today by Dr. Erlinda Hong,  - Seen by PT, recommendation is for SNF placement. - Lovenox for DVT prophylaxis per orthopedic, very likely will need wheelchair primarily for the first 6-8 weeks per ortho, initially with bilateral great toe fracture.  Diabetes mellitus  - Continue to hold oral hypoglycemic agent, CBG acceptable on Lantus and insulin sliding scale  HTN (hypertension), benign -Hold lisinopril, PRN hydralazine for systolic blood pressure greater than 170  AKI - Creatinine of 1.58 today, will start an IV fluids, continue to hold lisinopril, recheck in a.m.  Depression Resume home  dose of Wellbutrin, Zoloft, Klonopin daily at bedtime  Osteoporosis -H/o Vit D defi, will start on calcium and vitamin D     Code Status : DO NOT RESUSCITATE  Family Communication  : None at bedside  Disposition Plan  : Will need SNF placement, but patient is requesting home discharge, she'll be medically ready in 1-2 days if AKI resolves.  Consults  :  Nile Dear  Procedures  : Retrograde nailing of left supracondylar femur fracture, without intercondylar extension by Dr Erlinda Hong on 7/5  DVT Prophylaxis  :  Lovenox -  Lab Results  Component Value Date   PLT 244 02/21/2016    Antibiotics  :    Anti-infectives    Start     Dose/Rate Route Frequency Ordered Stop   02/20/16 2100  vancomycin (VANCOCIN) 1,500 mg in sodium chloride 0.9 % 500 mL IVPB  Status:  Discontinued     1,500 mg 250 mL/hr over 120 Minutes Intravenous Every 12 hours 02/20/16 1517 02/21/16 0732   02/20/16 1600  vancomycin (VANCOCIN) 1,500 mg in sodium chloride 0.9 % 500 mL IVPB     1,500 mg 250 mL/hr over 120 Minutes Intravenous To [facility] 02/19/16 1954 02/20/16 1139        Objective:   Filed Vitals:   02/20/16 1601 02/20/16 2025 02/21/16 0148 02/21/16 0410  BP: 144/69 125/56 104/53 115/51  Pulse: 88 93 98 89  Temp: 98.1 F (36.7 C) 98.2 F (36.8 C) 97.5 F (36.4 C) 97.6 F (36.4 C)  TempSrc: Oral Oral Oral Oral  Resp: 18 17 18 17   SpO2: 99% 100% 100% 98%    Wt Readings from Last 3 Encounters:  07/28/13 108.863 kg (240 lb)     Intake/Output Summary (Last 24 hours) at 02/21/16 1029 Last data filed at 02/21/16 0707  Gross per 24 hour  Intake   2735 ml  Output   1225 ml  Net   1510 ml     Physical Exam  Awake Alert, Oriented X 3,  Supple Neck,No JVD, No cervical lymphadenopathy appriciated.  Symmetrical Chest wall movement, Good air movement bilaterally, CTAB RRR,No Gallops,Rubs or new Murmurs, No Parasternal Heave +ve B.Sounds, Abd Soft, No tenderness, . No Cyanosis, Clubbing  or edema,Left lower extremity in immobilizer    Data Review:    CBC  Recent Labs Lab 02/19/16 1514 02/19/16 1534 02/20/16 0001 02/20/16 1625 02/21/16 0350  WBC 15.1*  --  12.0* 13.9* 11.9*  HGB 10.7* 11.9* 10.3* 8.9* 8.8*  HCT 34.5* 35.0* 34.3* 29.5* 29.3*  PLT 289  --  329 267 244  MCV 92.5  --  92.7 92.2 92.1  MCH 28.7  --  27.8 27.8 27.7  MCHC 31.0  --  30.0 30.2 30.0  RDW 13.8  --  13.6 13.6 13.7  LYMPHSABS 0.8  --   --   --   --   MONOABS 0.4  --   --   --   --   EOSABS 0.0  --   --   --   --   BASOSABS 0.0  --   --   --   --     Chemistries   Recent Labs Lab 02/19/16 1514 02/19/16 1534 02/20/16 0001 02/20/16 1625 02/21/16 0629  NA 138 137 137  --  135  K 4.5 4.5 4.6  --  4.7  CL 104 104 105  --  106  CO2 22  --  26  --  23  GLUCOSE 270* 280* 139*  --  188*  BUN 25* 28* 24*  --  38*  CREATININE 0.85 0.80 0.94 1.41* 1.58*  CALCIUM 9.1  --  9.0  --  8.4*   ------------------------------------------------------------------------------------------------------------------ No results for input(s): CHOL, HDL, LDLCALC, TRIG, CHOLHDL, LDLDIRECT in the last 72 hours.  Lab Results  Component Value Date   HGBA1C * 11/11/2007    9.0 (NOTE)   The  ADA recommends the following therapeutic goals for glycemic   control related to Hgb A1C measurement:   Goal of Therapy:   < 7.0% Hgb A1C   Action Suggested:  > 8.0% Hgb A1C   Ref:  Diabetes Care, 22, Suppl. 1, 1999   ------------------------------------------------------------------------------------------------------------------  Recent Labs  02/19/16 1824  TSH 1.496   ------------------------------------------------------------------------------------------------------------------ No results for input(s): VITAMINB12, FOLATE, FERRITIN, TIBC, IRON, RETICCTPCT in the last 72 hours.  Coagulation profile No results for input(s): INR, PROTIME in the last 168 hours.  No results for input(s): DDIMER in the last 72  hours.  Cardiac Enzymes No results for input(s): CKMB, TROPONINI, MYOGLOBIN in the last 168 hours.  Invalid input(s): CK ------------------------------------------------------------------------------------------------------------------ No results found for: BNP  Inpatient Medications  Scheduled Meds: . buPROPion  300 mg Oral Daily  . calcium-vitamin D  1 tablet Oral BID  . Chlorhexidine Gluconate Cloth  6 each Topical Daily  . clonazePAM  1 mg Oral QHS  . enoxaparin (LOVENOX) injection  40 mg Subcutaneous Q24H  . FLUoxetine  40 mg Oral Daily  . insulin aspart  0-15 Units Subcutaneous TID WC  . insulin aspart  0-5 Units Subcutaneous QHS  . insulin glargine  10 Units Subcutaneous Daily  . mupirocin ointment  1 application Nasal BID   Continuous Infusions: . sodium chloride 100 mL/hr at 02/21/16 0803   Followed by  . sodium chloride    . lactated ringers 10 mL/hr at 02/20/16 0953   PRN Meds:.acetaminophen **OR** acetaminophen, alum & mag hydroxide-simeth, hydrALAZINE, HYDROcodone-acetaminophen, HYDROmorphone (DILAUDID) injection, menthol-cetylpyridinium **OR** phenol, methocarbamol **OR** methocarbamol (ROBAXIN)  IV, metoCLOPramide **OR** metoCLOPramide (REGLAN) injection, morphine injection, ondansetron **OR** ondansetron (ZOFRAN) IV, oxyCODONE  Micro Results Recent Results (from the past 240 hour(s))  Urine culture     Status: Abnormal (Preliminary result)   Collection Time: 02/19/16  9:13 PM  Result Value Ref Range Status   Specimen Description URINE, CATHETERIZED  Final   Special Requests none  Final   Culture >=100,000 COLONIES/mL GRAM NEGATIVE RODS (A)  Final   Report Status PENDING  Incomplete  Surgical pcr screen     Status: Abnormal   Collection Time: 02/19/16  9:47 PM  Result Value Ref Range Status   MRSA, PCR NEGATIVE NEGATIVE Final   Staphylococcus aureus POSITIVE (A) NEGATIVE Final    Comment:        The Xpert SA Assay (FDA approved for NASAL specimens in  patients over 35 years of age), is one component of a comprehensive surveillance program.  Test performance has been validated by [facility] for patients greater than or equal to 64 year old. It is not intended to diagnose infection nor to guide or monitor treatment.     Radiology Reports Dg Chest 2 View  02/19/2016  CLINICAL DATA:  Pain following fall.  Hypertension. EXAM: CHEST  2 VIEW COMPARISON:  Chest radiograph April 09, 2011 and chest CT July 28, 2013 FINDINGS: There is no edema or consolidation. Heart is upper normal in size with pulmonary vascularity within normal limits. There is atherosclerotic calcification in the aorta. There is mitral annulus calcification. There is no demonstrable adenopathy. There is evidence of old trauma with remodeling involving the proximal left humerus. There old healed rib fractures on the right. IMPRESSION: No edema or consolidation.  There is aortic atherosclerosis. Electronically Signed   By: Lowella Grip III M.D.   On: 02/19/2016 15:55   Dg Pelvis 1-2 Views  02/19/2016  CLINICAL DATA:  Patient fell  today from standing position. Pt c/o severe left leg pain, left generalized arm pain, and right toe pain. EXAM: PELVIS - 1-2 VIEW COMPARISON:  None. FINDINGS: No fracture.  No bone lesion.  Bones are demineralized. Hip joints, SI joints and symphysis pubis are normally aligned. IMPRESSION: No fracture or dislocation. Electronically Signed   By: Lajean Manes M.D.   On: 02/19/2016 14:40   Dg Forearm Left  02/19/2016  CLINICAL DATA:  Patient fell today from standing position. Pt c/o severe left leg pain, left generalized arm pain, and right toe pain. EXAM: LEFT FOREARM - 2 VIEW COMPARISON:  None. FINDINGS: No fracture.  No dislocation.  Bones are demineralized. There is soft tissue swelling along the radial aspect of the distal forearm. No radiopaque foreign body. IMPRESSION: No fracture or dislocation. Electronically Signed   By: Lajean Manes M.D.    On: 02/19/2016 14:37   Dg Tibia/fibula Left  02/19/2016  CLINICAL DATA:  Patient fell today from standing position. Pt c/o severe left leg pain, left generalized arm pain, and right toe pain. EXAM: LEFT TIBIA AND FIBULA - 2 VIEW COMPARISON:  None. FINDINGS: No fracture.  No bone lesion. There are advanced degenerative changes of the knee with marked medial compartment narrowing. Ankle joint is normally spaced and aligned. C/o Bones are demineralized. There are soft tissue and vascular calcifications along the left leg with multiple vascular clips from previous the umbilical vein surgery. Small IMPRESSION: 1. No acute fracture or dislocation. Electronically Signed   By: Lajean Manes M.D.   On: 02/19/2016 14:39   Ct Knee Left Wo Contrast  02/19/2016  CLINICAL DATA:  Fall from standing position. Severe pain to left knee, toes on left and right feet. EXAM: CT OF THE LEFT KNEE WITHOUT CONTRAST TECHNIQUE: Multidetector CT imaging of the LEFT knee was performed according to the standard protocol. Multiplanar CT image reconstructions were also generated. COMPARISON:  None. FINDINGS: There is a markedly displaced/comminuted fracture within the distal left femur, with significant impaction at the fracture site and anterior-lateral angulation at the fracture site. Fracture does not appear to extend to the articular surface of either femoral condyles. No fracture seen within the proximal tibia or fibula. Extensive degenerative change at the right knee, tricompartmental, with most severe joint space narrowing and osteophyte formation at the medial compartment with abutment of the medial femoral head aunt tibial plateau. Joint effusion is present, most prominent within the suprapatellar bursa, moderate in size. Superficial soft tissues are unremarkable. IMPRESSION: 1. Markedly displaced/comminuted fracture of the distal left femur, centered at the distal metaphysis, with prominent impaction at the fracture site and  anterior-lateral angulation at the fracture site. Fracture does not appear to extend to the articular surface of either femoral condyle. 2. 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 3. Joint effusion. Electronically Signed   By: Franki Cabot M.D.   On: 02/19/2016 16:35   Dg Humerus Left  02/19/2016  CLINICAL DATA:  Fall.  Severe left leg pain.  Left arm pain. EXAM: LEFT HUMERUS - 2+ VIEW COMPARISON:  12/24/2008 FINDINGS: Old healed fracture within the proximal left humerus. No acute fracture. No subluxation or dislocation. IMPRESSION: No acute bony abnormality. Electronically Signed   By: Rolm Baptise M.D.   On: 02/19/2016 14:31   Dg Foot Complete Left  02/19/2016  CLINICAL DATA:  Patient fell today from standing position. Pt c/o severe left leg  pain, left generalized arm pain, and right toe pain. EXAM: LEFT FOOT - COMPLETE 3+ VIEW COMPARISON:  None. FINDINGS: There is a nondisplaced, non comminuted fracture across the medial base of the proximal phalanx of the great toe. No other acute fracture.  No dislocation. Bones are diffusely demineralized. IMPRESSION: Nondisplaced fracture of the medial base of the proximal phalanx of left great toe. Electronically Signed   By: Lajean Manes M.D.   On: 02/19/2016 14:34   Dg Foot Complete Right  02/19/2016  CLINICAL DATA:  Patient fell today from standing position. Pt c/o severe left leg pain, left generalized arm pain, and right toe pain. EXAM: RIGHT FOOT COMPLETE - 3+ VIEW COMPARISON:  09/30/2013 FINDINGS: There is nondisplaced fracture across the lateral base of the proximal phalanx of the great toe. No other acute fractures. Old healed fracture of the proximal first metatarsal. Joints are normally aligned. Bones are diffusely demineralized. IMPRESSION: 1. Nondisplaced fracture across the lateral base of the proximal phalanx of the right great toe. No other  fractures. No dislocation. Electronically Signed   By: Lajean Manes M.D.   On: 02/19/2016 14:36   Dg C-arm 1-60 Min  02/20/2016  CLINICAL DATA:  Post left femur fracture repair EXAM: LEFT FEMUR 2 VIEWS; DG C-ARM 61-120 MIN COMPARISON:  02/19/2016 FINDINGS: The patient is status post open reduction internal fixation of distal left femoral fracture. There is intra medullary rod in left femur. At least three locking screws are noted in distal left femur. There is improvement with near anatomic alignment. IMPRESSION: Status post intraoperative repair of distal left femoral fracture. Intra medullary rod and locking screws are noted in left femur. There is improvement near anatomic alignment. Fluoroscopy time was 3 minutes 12 seconds. Please see the operative report. Electronically Signed   By: Lahoma Crocker M.D.   On: 02/20/2016 12:37   Dg Femur Min 2 Views Left  02/20/2016  CLINICAL DATA:  Post left femur fracture repair EXAM: LEFT FEMUR 2 VIEWS; DG C-ARM 61-120 MIN COMPARISON:  02/19/2016 FINDINGS: The patient is status post open reduction internal fixation of distal left femoral fracture. There is intra medullary rod in left femur. At least three locking screws are noted in distal left femur. There is improvement with near anatomic alignment. IMPRESSION: Status post intraoperative repair of distal left femoral fracture. Intra medullary rod and locking screws are noted in left femur. There is improvement near anatomic alignment. Fluoroscopy time was 3 minutes 12 seconds. Please see the operative report. Electronically Signed   By: Lahoma Crocker M.D.   On: 02/20/2016 12:37   Dg Femur Min 2 Views Left  02/19/2016  CLINICAL DATA:  Patient fell today from standing position. Pt c/o severe left leg pain, left generalized arm pain, and right toe pain. EXAM: LEFT FEMUR 2 VIEWS COMPARISON:  None. FINDINGS: There is an oblique fracture of the distal femur. Fracture is mildly comminuted. Fracture is also mildly displaced with  the distal primary fracture component telescoping on the shaft component by 2.7 cm. There is approximate 1 cm of lateral displacement. There is mild posterior angulation. No other fracture.  No dislocation. Bones are demineralized. IMPRESSION: 1. Fracture of the distal left femur as described.  No dislocation. Electronically Signed   By: Lajean Manes M.D.   On: 02/19/2016 14:33     Nyoka Alcoser M.D on 02/21/2016 at 10:29 AM  Between 7am to 7pm - Pager - [phone]  After 7pm go to [email or web address] - password [email or web address]  [facility] -  Office  [phone]

## 2016-02-21 NOTE — NC FL2 (Signed)
[place] LEVEL OF CARE SCREENING TOOL     IDENTIFICATION  Patient Name: [name] Birthdate: 02/15/1951 Sex: female Admission Date (Current Location): 02/19/2016  [facility] and [place] Number:  Herbalist and Address:  [facility] [place], [place] [address], [place], [place] [address]      Provider Number: [id number]  Attending Physician Name and Address:  Albertine Patricia, MD  Relative Name and Phone Number:       Current Level of Care: Hospital Recommended Level of Care: [place] Prior Approval Number:    Date Approved/Denied:   PASRR Number:    Discharge Plan: SNF    Current Diagnoses: Patient Active Problem List   Diagnosis Date Noted  . 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 HTN (hypertension), benign 02/19/2016  . Fracture, femur, distal ([place]) 02/19/2016  . Fall 02/19/2016  . Depression 02/19/2016  . Closed left femoral fracture, initial encounter 02/19/2016  . Femur fracture, left ([place]) 02/19/2016    Orientation RESPIRATION BLADDER Height & Weight     Self, Time, Situation, Place  O2 Continent Weight:   Height:     BEHAVIORAL SYMPTOMS/MOOD NEUROLOGICAL BOWEL NUTRITION STATUS      Continent Diet (Please see discharge summary.)  AMBULATORY STATUS COMMUNICATION OF NEEDS Skin   Extensive Assist Verbally Surgical wounds                       Personal Care Assistance Level of Assistance  Bathing, Feeding, Dressing Bathing Assistance: Maximum assistance Feeding assistance: Maximum assistance Dressing Assistance: Maximum assistance     Functional Limitations Info             SPECIAL CARE FACTORS FREQUENCY  PT (By licensed PT)                    Contractures      Additional Factors Info  Code Status, Allergies Code Status Info: DNR Allergies Info: No known allergies           Current Medications (02/21/2016):  This is the  current hospital active medication list Current Facility-Administered Medications  Medication Dose Route Frequency Provider Last Rate Last Dose  . 0.9 %  sodium chloride infusion   Intravenous Continuous Albertine Patricia, MD 100 mL/hr at 02/21/16 1133     Followed by  . 0.9 %  sodium chloride infusion   Intravenous Continuous Albertine Patricia, MD      . acetaminophen (TYLENOL) tablet 650 mg  650 mg Oral Q6H PRN Leandrew Koyanagi, MD   650 mg at 02/21/16 0221   Or  . acetaminophen (TYLENOL) suppository 650 mg  650 mg Rectal Q6H PRN Leandrew Koyanagi, MD      . alum & mag hydroxide-simeth (MAALOX/MYLANTA) 200-200-20 MG/5ML suspension 30 mL  30 mL Oral Q4H PRN Leandrew Koyanagi, MD   30 mL at 02/21/16 1135  . buPROPion (WELLBUTRIN XL) 24 hr tablet 300 mg  300 mg Oral Daily Domenic Polite, MD   300 mg at 02/21/16 0802  . calcium-vitamin D (OSCAL WITH D) 500-200 MG-UNIT per tablet 1 tablet  1 tablet Oral BID Albertine Patricia, MD   1 tablet at 02/21/16 0802  . Chlorhexidine Gluconate Cloth 2 % PADS 6 each  6 each Topical Daily Domenic Polite, MD   6 each at 02/21/16 1004  . clonazePAM (KLONOPIN) tablet 1 mg  1 mg  Oral QHS Domenic Polite, MD   1 mg at 02/20/16 2257  . enoxaparin (LOVENOX) injection 40 mg  40 mg Subcutaneous Q24H Naiping Ephriam Jenkins, MD   40 mg at 02/21/16 0802  . FLUoxetine (PROZAC) capsule 40 mg  40 mg Oral Daily Domenic Polite, MD   40 mg at 02/21/16 0801  . hydrALAZINE (APRESOLINE) injection 10 mg  10 mg Intravenous Q6H PRN Domenic Polite, MD      . HYDROcodone-acetaminophen (NORCO/VICODIN) 5-325 MG per tablet 1-2 tablet  1-2 tablet Oral Q6H PRN Leandrew Koyanagi, MD   2 tablet at 02/21/16 1434  . HYDROmorphone (DILAUDID) injection 1 mg  1 mg Intravenous V9Y PRN Delora Fuel, MD   1 mg at 80/16/55 0659  . insulin aspart (novoLOG) injection 0-15 Units  0-15 Units Subcutaneous TID WC Albertine Patricia, MD   3 Units at 02/21/16 1313  . insulin aspart (novoLOG) injection 0-5 Units  0-5 Units Subcutaneous QHS  Albertine Patricia, MD   0 Units at 02/20/16 2300  . insulin glargine (LANTUS) injection 10 Units  10 Units Subcutaneous Daily Albertine Patricia, MD   10 Units at 02/21/16 1003  . lactated ringers infusion   Intravenous Continuous Roberts Gaudy, MD 10 mL/hr at 02/20/16 [phone]    . menthol-cetylpyridinium (CEPACOL) lozenge 3 mg  1 lozenge Oral PRN Leandrew Koyanagi, MD   3 mg at 02/21/16 2707   Or  . phenol (CHLORASEPTIC) mouth spray 1 spray  1 spray Mouth/Throat PRN Naiping Ephriam Jenkins, MD      . methocarbamol (ROBAXIN) tablet 500 mg  500 mg Oral Q6H PRN Leandrew Koyanagi, MD   500 mg at 02/20/16 2057   Or  . methocarbamol (ROBAXIN) 500 mg in dextrose 5 % 50 mL IVPB  500 mg Intravenous Q6H PRN Naiping Ephriam Jenkins, MD      . metoCLOPramide (REGLAN) tablet 5-10 mg  5-10 mg Oral Q8H PRN Naiping Ephriam Jenkins, MD       Or  . metoCLOPramide (REGLAN) injection 5-10 mg  5-10 mg Intravenous Q8H PRN Naiping Ephriam Jenkins, MD      . morphine 2 MG/ML injection 0.5 mg  0.5 mg Intravenous Q2H PRN Naiping Ephriam Jenkins, MD      . mupirocin ointment (BACTROBAN) 2 % 1 application  1 application Nasal BID Domenic Polite, MD   1 application at 86/75/44 1312  . ondansetron (ZOFRAN) tablet 4 mg  4 mg Oral Q6H PRN Naiping Ephriam Jenkins, MD       Or  . ondansetron [facility]) injection 4 mg  4 mg Intravenous Q6H PRN Leandrew Koyanagi, MD   4 mg at 02/21/16 1324  . oxyCODONE (Oxy IR/ROXICODONE) immediate release tablet 5-10 mg  5-10 mg Oral Q4H PRN Leandrew Koyanagi, MD   10 mg at 02/20/16 1256     Discharge Medications: Please see discharge summary for a list of discharge medications.  Relevant Imaging Results:  Relevant Lab Results:   Additional Information SSN: [id number]  Caroline Sauger, LCSW

## 2016-02-21 NOTE — Progress Notes (Signed)
   Subjective:  Patient reports pain as moderate.  Had pain last night.  Objective:   VITALS:   Filed Vitals:   02/20/16 1601 02/20/16 2025 02/21/16 0148 02/21/16 0410  BP: 144/69 125/56 104/53 115/51  Pulse: 88 93 98 89  Temp: 98.1 F (36.7 C) 98.2 F (36.8 C) 97.5 F (36.4 C) 97.6 F (36.4 C)  TempSrc: Oral Oral Oral Oral  Resp: 18 17 18 17   SpO2: 99% 100% 100% 98%    Neurologically intact Neurovascular intact Sensation intact distally Intact pulses distally Dorsiflexion/Plantar flexion intact Incision: dressing C/D/I and no drainage No cellulitis present Compartment soft   Lab Results  Component Value Date   WBC 11.9* 02/21/2016   HGB 8.8* 02/21/2016   HCT 29.3* 02/21/2016   MCV 92.1 02/21/2016   PLT 244 02/21/2016     Assessment/Plan:  1 Day Post-Op   - Expected postop acute blood loss anemia - will monitor for symptoms - Up with PT/OT - will need SNF, anticipate patient will need wheelchair primarily for the first 6-8 weeks - DVT ppx - SCDs, ambulation, lovenox - NWB operative extremity - Pain control - Discharge planning per primary team  Marianna Payment 02/21/2016, 7:25 AM [phone]

## 2016-02-21 NOTE — Progress Notes (Signed)
Inpatient Diabetes Program Recommendations  AACE/ADA: New Consensus Statement on Inpatient Glycemic Control (2015)  Target Ranges:  Prepandial:   less than 140 mg/dL      Peak postprandial:   less than 180 mg/dL (1-2 hours)      Critically ill patients:  140 - 180 mg/dL    Review of Glycemic ControlResults for [name], [name] (MRN [id number]) as of 02/21/2016 11:58  Ref. Range 02/20/2016 08:55 02/20/2016 12:42 02/20/2016 17:14 02/20/2016 22:13 02/21/2016 06:20  Glucose-Capillary Latest Ref Range: 65-99 mg/dL 230 (H) 156 (H) 189 (H) 152 (H) 185 (H)   Inpatient Diabetes Program Recommendations:    Consider increasing Lantus to 15 units daily.  Thanks, Adah Perl, RN, BC-ADM Inpatient Diabetes Coordinator Pager [phone] (8a-5p)

## 2016-02-21 NOTE — Evaluation (Signed)
Occupational Therapy Evaluation Patient Details Name: [name] MRN: [id number] DOB: 1951-03-04 Today's Date: 02/21/2016    History of Present Illness 65 y.o. female who presents with left distal femur fracture due to mechanical fall, now s/p retrograde nail. Pt also sustaining bilateral great toe fractures. [id number], hypertension, depression.     Clinical Impression   Pt with significant decline in function and safety with ADLs and ADL mobility with decreased strength, balance, endurance. Pt requires extensive assist with ADLs and +2 assist for mobility. Based upon the patient's current mobility level, recommending SNF for further rehabilitation. Pt states that she needs to be able to go home to take care of her 30 y.o. Granddaughter and her husband is still working full time. Pt would benefit from acute OT services to address impairments to increase level of function and safety    Follow Up Recommendations  SNF;Supervision/Assistance - 24 hour (states that she is going home no matter what)    Equipment Recommendations  Other (comment) (if pt refuses SNF anticipate she would need for w/c, rw, 3:1 BSC, shower chair,  ADL A/E (will continue to assess))    Recommendations for Other Services       Precautions / Restrictions Precautions Precautions: Fall Precaution Comments: bilateral great toe fractures Required Braces or Orthoses: Knee Immobilizer - Left Restrictions Weight Bearing Restrictions: Yes LLE Weight Bearing: Non weight bearing      Mobility Bed Mobility Overal bed mobility: Needs Assistance Bed Mobility: Rolling Rolling: Max assist   Supine to sit: Max assist;+2 for physical assistance Sit to supine: Total assist;+2 for physical assistance   General bed mobility comments: Total A with LEs to EOB and back onto bed. Attempted lateral scooting at EOB, pt unable to perform.   Transfers Overall transfer level: Needs assistance Equipment used: Rolling walker  (2 wheeled) Transfers: Sit to/from Stand Sit to Stand: Max assist;+2 physical assistance         General transfer comment: Attempted sit - stand X 2 before able to stand at RW < 30 seconds    Balance Overall balance assessment: Needs assistance   Sitting balance-Leahy Scale: Fair Sitting balance - Comments: mild instability initially but improving with time.      Standing balance-Leahy Scale: Zero                              ADL Overall ADL's : Needs assistance/impaired     Grooming: Wash/dry hands;Wash/dry face;Sitting;Min guard   Upper Body Bathing: Sitting;Minimal assitance   Lower Body Bathing: Maximal assistance   Upper Body Dressing : Sitting;Minimal assistance   Lower Body Dressing: Total assistance     Toilet Transfer Details (indicate cue type and reason): Attempted sit - stand X 2 at EOB before able to stand at RW < 30 seconds. Pt unable to SPT to [facility] Toileting- Clothing Manipulation and Hygiene: Total assistance;Bed level       Functional mobility during ADLs: Maximal assistance;+2 for physical assistance;Rolling walker       Vision  wears reading glasses, no change from baseline              Pertinent Vitals/Pain Pain Assessment: 0-10 Pain Score: 4  Pain Location: 4/10 headache, 7/10 L LE Pain Descriptors / Indicators: Aching;Constant;Grimacing;Guarding Pain Intervention(s): Limited activity within patient's tolerance;Monitored during session;Repositioned     Hand Dominance Left   Extremity/Trunk Assessment Upper Extremity Assessment Upper Extremity Assessment: Generalized weakness;LUE deficits/detail LUE Deficits /  Details: L wrist painful with exertion   Lower Extremity Assessment Lower Extremity Assessment: Defer to PT evaluation       Communication Communication Communication: No difficulties   Cognition Arousal/Alertness: Awake/alert Behavior During Therapy: WFL for tasks assessed/performed Overall Cognitive  Status: Within Functional Limits for tasks assessed                     General Comments   pt pleasant and cooperative, pt states that she is stubborn                 Home Living Family/patient expects to be discharged to:: Private residence Living Arrangements: Spouse/significant other;Other (Comment) Available Help at Discharge: Family;Available PRN/intermittently (husband works FT and 31 y/o granddaughter lives with them) Type of Home: Mobile home Home Access: Stairs to enter Technical brewer of Steps: 3 Entrance Stairs-Rails: Right Home Layout: One level     Bathroom Shower/Tub: Tub/shower unit;Walk-in shower   Bathroom Toilet: Handicapped height     Home Equipment: [place] - single point   Additional Comments: spouse works full time (home around 3:00 pm).       Prior Functioning/Environment Level of Independence: Independent        Comments: occasional use of cane    OT Diagnosis: Generalized weakness;Acute pain   OT Problem List: Pain;Impaired balance (sitting and/or standing);Decreased activity tolerance;Decreased knowledge of use of DME or AE;Decreased strength   OT Treatment/Interventions: Self-care/ADL training;Therapeutic exercise;Patient/family education;Therapeutic activities;DME and/or AE instruction;Neuromuscular education    OT Goals(Current goals can be found in the care plan section) Acute Rehab OT Goals Patient Stated Goal: go home and just do what we have to do OT Goal Formulation: With patient/family Time For Goal Achievement: 02/21/16 Potential to Achieve Goals: Good ADL Goals Pt Will Perform Grooming: with set-up;with supervision;sitting Pt Will Perform Upper Body Bathing: with min guard assist;with supervision;with set-up;sitting Pt Will Perform Lower Body Bathing: with mod assist;with adaptive equipment;sitting/lateral leans Pt Will Perform Upper Body Dressing: with min guard assist;with supervision;with set-up;sitting Pt Will  Perform Lower Body Dressing: with mod assist;sitting/lateral leans;with adaptive equipment;with max assist Pt Will Transfer to Toilet: bedside commode;stand pivot transfer;with max assist;with mod assist Pt Will Perform Toileting - Clothing Manipulation and hygiene: with mod assist;sitting/lateral leans Pt Will Perform Tub/Shower Transfer: shower seat;3 in 1;with caregiver independent in assisting;with max assist;with mod assist  OT Frequency: Min 2X/week   Barriers to D/C: Decreased caregiver support  Uncertain if pt will have safe and adequate level of assist needed at home. pt's husband works full time and 14 y/o grand daughter lives with them. Pt and spouse insistant on retruning home due to not having any assist with grand daughter       Co-evaluation PT/OT/SLP Co-Evaluation/Treatment: Yes Reason for Co-Treatment: For patient/therapist safety   OT goals addressed during session: ADL's and self-care;Proper use of Adaptive equipment and DME      End of Session Equipment Utilized During Treatment: Gait belt;Rolling walker  Activity Tolerance: Patient limited by pain;Other (comment) (anxiety, fearfulness) Patient left: in bed;with call bell/phone within reach;with family/visitor present   Time: 9458-5929 OT Time Calculation (min): 30 min Charges:  OT General Charges $OT Visit: 1 Procedure OT Evaluation $OT Eval Moderate Complexity: 1 Procedure G-Codes:    Britt Bottom 02/21/2016, 2:31 PM

## 2016-02-21 NOTE — Progress Notes (Signed)
Physical Therapy Treatment Patient Details Name: [name] MRN: [id number] DOB: [birth year] Today's Date: 02/21/2016    History of Present Illness 65 y.o. female who presents with left distal femur fracture due to mechanical fall, now s/p retrograde nail. Pt also sustaining bilateral great toe fractures. XIP:[id number], hypertension, depression.      PT Comments    Pt able to achieve standing with +2 max assist with use of rw and 2 attempts. Pt unable to maintain standing balance to attempt steps or pivot transfer. Bed mobility also remains very limited, requiring max-+2 total assist. Pt continues to state that she is going to return to home no matter what her mobility level is. PT to continue to follow, may benefit from use of Lt platform walker due to Lt wrist pain in standing.   Follow Up Recommendations  SNF;Supervision for mobility/OOB     Equipment Recommendations  Other (comment) (Yet to be determined, depending on D/C destination)    Recommendations for Other Services       Precautions / Restrictions Precautions Precautions: Fall Precaution Comments: bilateral great toe fractures Required Braces or Orthoses: Knee Immobilizer - Left Restrictions Weight Bearing Restrictions: Yes LLE Weight Bearing: Non weight bearing    Mobility  Bed Mobility Overal bed mobility: Needs Assistance Bed Mobility: Rolling Rolling: Max assist   Supine to sit: Max assist Sit to supine: Total assist;+2 for physical assistance   General bed mobility comments: Pt requiring increased assistance during second PT session. Attempted to lateral scoot on bed, pt unable to perform.  Transfers Overall transfer level: Needs assistance Equipment used: Rolling walker (2 wheeled) Transfers: Sit to/from Stand Sit to Stand: Max assist;+2 physical assistance         General transfer comment: Attempted sit - stand X 2 before able to stand at RW < 30 seconds  Ambulation/Gait                  Stairs            Wheelchair Mobility    Modified Rankin (Stroke Patients Only)       Balance Overall balance assessment: Needs assistance Sitting-balance support: No upper extremity supported Sitting balance-Leahy Scale: Good (by end of session) Sitting balance - Comments: mild instability initially but improving with time.      Standing balance-Leahy Scale: Zero Standing balance comment: +2 max assist needed for standing.                     Cognition Arousal/Alertness: Awake/alert Behavior During Therapy: Anxious Overall Cognitive Status: Within Functional Limits for tasks assessed                      Exercises      General Comments        Pertinent Vitals/Pain Pain Assessment: 0-10 Pain Score: 7  Pain Location: LLE Pain Descriptors / Indicators: Aching Pain Intervention(s): Limited activity within patient's tolerance;Monitored during session    Home Living Family/patient expects to be discharged to:: Private residence Living Arrangements: Spouse/significant other;Other (Comment) Available Help at Discharge: Family;Available PRN/intermittently (husband works FT and 37 y/o granddaughter lives with them) Type of Home: Mobile home Home Access: Stairs to enter Entrance Stairs-Rails: Right Home Layout: One level Home Equipment: [place] - single point Additional Comments: spouse works full time (home around 3:00 pm).     Prior Function Level of Independence: Independent      Comments: occasional use of cane   PT Goals (  current goals can now be found in the care plan section) Acute Rehab PT Goals Patient Stated Goal: go home and just do what we have to do PT Goal Formulation: With patient Time For Goal Achievement: 03/06/16 Potential to Achieve Goals: Fair Progress towards PT goals: Progressing toward goals    Frequency  Min 5X/week    PT Plan Current plan remains appropriate    Co-evaluation PT/OT/SLP Co-Evaluation/Treatment:  Yes Reason for Co-Treatment: For patient/therapist safety PT goals addressed during session: Mobility/safety with mobility OT goals addressed during session: ADL's and self-care;Proper use of Adaptive equipment and DME     End of Session Equipment Utilized During Treatment: Gait belt;Left knee immobilizer Activity Tolerance: Patient tolerated treatment well Patient left: in bed;with call bell/phone within reach;with SCD's reapplied     Time: 1334-1405 PT Time Calculation (min) (ACUTE ONLY): 31 min  Charges:  $Therapeutic Activity: 8-22 mins                    G Codes:      Cassell Clement, PT, CSCS Pager [phone] Office [phone]  02/21/2016, 2:55 PM

## 2016-02-22 ENCOUNTER — Inpatient Hospital Stay
Admission: RE | Admit: 2016-02-22 | Discharge: 2016-03-29 | Disposition: A | Discharge status: Home / Self Care | Payer: Medicare Other | Source: Ambulatory Visit | Attending: Internal Medicine | Admitting: Internal Medicine

## 2016-02-22 ENCOUNTER — Other Ambulatory Visit: Payer: Self-pay

## 2016-02-22 DIAGNOSIS — S7292XA Unspecified fracture of left femur, initial encounter for closed fracture: Secondary | ICD-10-CM

## 2016-02-22 DIAGNOSIS — I10 Essential (primary) hypertension: Secondary | ICD-10-CM | POA: Diagnosis not present

## 2016-02-22 DIAGNOSIS — S42412D Displaced simple supracondylar fracture without intercondylar fracture of left humerus, subsequent encounter for fracture with routine healing: Secondary | ICD-10-CM | POA: Diagnosis not present

## 2016-02-22 DIAGNOSIS — Z9181 History of falling: Secondary | ICD-10-CM | POA: Diagnosis not present

## 2016-02-22 DIAGNOSIS — B961 Klebsiella pneumoniae [K. pneumoniae] as the cause of diseases classified elsewhere: Secondary | ICD-10-CM | POA: Diagnosis not present

## 2016-02-22 DIAGNOSIS — R293 Abnormal posture: Secondary | ICD-10-CM | POA: Diagnosis not present

## 2016-02-22 DIAGNOSIS — R279 Unspecified lack of coordination: Secondary | ICD-10-CM | POA: Diagnosis not present

## 2016-02-22 DIAGNOSIS — Z4789 Encounter for other orthopedic aftercare: Secondary | ICD-10-CM | POA: Diagnosis not present

## 2016-02-22 DIAGNOSIS — M6281 Muscle weakness (generalized): Secondary | ICD-10-CM | POA: Diagnosis not present

## 2016-02-22 DIAGNOSIS — M810 Age-related osteoporosis without current pathological fracture: Secondary | ICD-10-CM | POA: Diagnosis not present

## 2016-02-22 DIAGNOSIS — E119 Type 2 diabetes mellitus without complications: Secondary | ICD-10-CM | POA: Diagnosis not present

## 2016-02-22 DIAGNOSIS — N390 Urinary tract infection, site not specified: Secondary | ICD-10-CM | POA: Diagnosis not present

## 2016-02-22 DIAGNOSIS — S92404D Nondisplaced unspecified fracture of right great toe, subsequent encounter for fracture with routine healing: Secondary | ICD-10-CM | POA: Diagnosis not present

## 2016-02-22 DIAGNOSIS — S92405D Nondisplaced unspecified fracture of left great toe, subsequent encounter for fracture with routine healing: Secondary | ICD-10-CM | POA: Diagnosis not present

## 2016-02-22 DIAGNOSIS — F329 Major depressive disorder, single episode, unspecified: Secondary | ICD-10-CM

## 2016-02-22 LAB — URINE CULTURE

## 2016-02-22 LAB — CBC
HCT: 27.1 % — ABNORMAL LOW (ref 36.0–46.0)
Hemoglobin: 8.4 g/dL — ABNORMAL LOW (ref 12.0–15.0)
MCH: 28.4 pg (ref 26.0–34.0)
MCHC: 31 g/dL (ref 30.0–36.0)
MCV: 91.6 fL (ref 78.0–100.0)
PLATELETS: 253 10*3/uL (ref 150–400)
RBC: 2.96 MIL/uL — ABNORMAL LOW (ref 3.87–5.11)
RDW: 13.7 % (ref 11.5–15.5)
WBC: 9.1 10*3/uL (ref 4.0–10.5)

## 2016-02-22 LAB — BASIC METABOLIC PANEL
Anion gap: 6 (ref 5–15)
BUN: 34 mg/dL — AB (ref 6–20)
CALCIUM: 9 mg/dL (ref 8.9–10.3)
CO2: 23 mmol/L (ref 22–32)
CREATININE: 1.16 mg/dL — AB (ref 0.44–1.00)
Chloride: 106 mmol/L (ref 101–111)
GFR calc Af Amer: 56 mL/min — ABNORMAL LOW (ref >60)
GFR, EST NON AFRICAN AMERICAN: 48 mL/min — AB (ref >60)
GLUCOSE: 169 mg/dL — AB (ref 65–99)
POTASSIUM: 4.6 mmol/L (ref 3.5–5.1)
Sodium: 135 mmol/L (ref 135–145)

## 2016-02-22 LAB — GLUCOSE, CAPILLARY
Glucose-Capillary: 144 mg/dL — ABNORMAL HIGH (ref 65–99)
Glucose-Capillary: 171 mg/dL — ABNORMAL HIGH (ref 65–99)

## 2016-02-22 MED ORDER — CALCIUM CARBONATE-VITAMIN D 500-200 MG-UNIT PO TABS: 1.0000 | ORAL_TABLET | Freq: Two times a day (BID) | ORAL | Status: DC

## 2016-02-22 MED ORDER — CLONAZEPAM 1 MG PO TABS: 1.0000 mg | ORAL_TABLET | Freq: Every day | ORAL | Status: DC

## 2016-02-22 MED ORDER — AMLODIPINE BESYLATE 5 MG PO TABS: 5.0000 mg | ORAL_TABLET | Freq: Every day | ORAL | Status: DC

## 2016-02-22 MED ORDER — INSULIN GLARGINE 100 UNIT/ML ~~LOC~~ SOLN: 10.0000 [IU] | Freq: Every day | SUBCUTANEOUS | Status: DC

## 2016-02-22 MED ORDER — INSULIN ASPART 100 UNIT/ML ~~LOC~~ SOLN: 0.0000 [IU] | Freq: Three times a day (TID) | SUBCUTANEOUS | Status: DC

## 2016-02-22 MED ORDER — CIPROFLOXACIN HCL 500 MG PO TABS: 500.0000 mg | ORAL_TABLET | Freq: Two times a day (BID) | ORAL | Status: DC

## 2016-02-22 NOTE — Clinical Social Work Note (Signed)
Clinical Social Work Assessment  Patient Details  Name: [name] MRN: [id number] Date of Birth: 1951-01-07  Date of referral:  02/22/16               Reason for consult:  Facility Placement                Permission sought to share information with:  Chartered certified accountant granted to share information::     Name::        Agency::  [facility]  Relationship::     Contact Information:     Housing/Transportation Living arrangements for the past 2 months:  Single Family Home Source of Information:  Patient Patient Interpreter Needed:  None Criminal Activity/Legal Involvement Pertinent to Current Situation/Hospitalization:  Yes Significant Relationships:  Adult Children Lives with:  Adult Children, Spouse Do you feel safe going back to the place where you live?  Yes Need for family participation in patient care:  No (Coment) (Patient able to make own decisions.)  Care giving concerns:  Patient expressed no concerns at this time.   Social Worker assessment / plan:  LCSW received referral for possible SNF placement at time of discharge. LCSW spoke with patient who is now agreeable to SNF placement. Patient has chosen [facility]. LCSW to continue to follow and assist with discharge planning needs.  Employment status:  Retired Forensic scientist:  Medicare PT Recommendations:  [place] / Referral to community resources:  [name]  Patient/Family's Response to care:  Patient understanding and agreeable to [employer] of care.  Patient/Family's Understanding of and Emotional Response to Diagnosis, Current Treatment, and Prognosis:  Patient understanding and agreeable to LCSW plan of care.  Emotional Assessment Appearance:  Appears stated age Attitude/Demeanor/Rapport:  Other (Appropriate) Affect (typically observed):  Accepting, Appropriate, Pleasant Orientation:  Oriented to Self, Oriented to  Place, Oriented to  Time, Oriented to Situation Alcohol / Substance use:  Not Applicable Psych involvement (Current and /or in the community):  No (Comment) (Not appropriate on this admission.)  Discharge Needs  Concerns to be addressed:  No discharge needs identified Readmission within the last 30 days:  No Current discharge risk:  None Barriers to Discharge:  No Barriers Identified   Katrina Sauger, LCSW 02/22/2016, 12:03 PM [phone]

## 2016-02-22 NOTE — Care Management Note (Signed)
Case Management Note  Patient Details  Name: [name] MRN: [id number] Date of Birth: 25-Aug-1950  Subjective/Objective:  65 yr old female s/p left femur fracture, underwent left femur IM Nailing.                Action/Plan: Case manager poke with patient at the bedside concerning discharge plan and home health needs. Patient not progressing well and was adamant that she would return home. As of this morning, 02/22/16, patient has agreed to go to SNF. Case manager notified Education officer, museum .    Expected Discharge Date:    01/24/16              Expected Discharge Plan:   [place]  In-House Referral:  Clinical Social Work  Discharge planning Services  CM Consult  Post Acute Care Choice:  NA Choice offered to:  Patient  DME Arranged:  N/A DME Agency:  NA  HH Arranged:  NA HH Agency:  NA  Status of Service:  Completed, signed off  If discussed at [employer] of Stay Meetings, dates discussed:    Additional Comments:  Ninfa Meeker, RN 02/22/2016, 11:36 AM

## 2016-02-22 NOTE — Progress Notes (Signed)
Physical Therapy Treatment Patient Details Name: [name] MRN: [id number] DOB: 1951-03-18 Today's Date: 02/22/2016    History of Present Illness 65 y.o. female who presents with left distal femur fracture due to mechanical fall, now s/p retrograde nail. Pt also sustaining bilateral great toe fractures. IRS:[id number], hypertension, depression.      PT Comments    Pt continues to progress slowly with PT regarding mobility, needing +2 assist for sit/stand. At this time the pt is unable to stand for more than 10 seconds before requiring a return to sitting. Discussed D/C recommendations with pt and she states that she recognizes that she will need to go somewhere for more therapy before going home. PT to continue to follow and progress as tolerated.   Follow Up Recommendations  SNF;Supervision for mobility/OOB     Equipment Recommendations  Other (comment) (to be addressed at next venue)    Recommendations for Other Services       Precautions / Restrictions Precautions Precautions: Fall Precaution Comments: bilateral great toe fractures Required Braces or Orthoses: Knee Immobilizer - Left Restrictions Weight Bearing Restrictions: Yes LLE Weight Bearing: Non weight bearing    Mobility  Bed Mobility Overal bed mobility: Needs Assistance Bed Mobility: Rolling Rolling: Mod assist   Supine to sit: Max assist Sit to supine: Total assist;+2 for physical assistance   General bed mobility comments: Pt moving slowly with bed mobility, taking multiple breaks. Assist needed at LEs and at trunk to fully come to sitting. Assist also with bed pad to pivot to EOB.   Transfers Overall transfer level: Needs assistance Equipment used: Left platform walker Transfers: Sit to/from Stand Sit to Stand: Max assist;+2 physical assistance         General transfer comment: Performed sit/stand X3 (X1 unable to stand), less than 10 seconds standing with cues for Rt knee extension and erect  posture.   Ambulation/Gait                 Stairs            Wheelchair Mobility    Modified Rankin (Stroke Patients Only)       Balance Overall balance assessment: Needs assistance Sitting-balance support: No upper extremity supported Sitting balance-Leahy Scale: Good       Standing balance-Leahy Scale: Zero Standing balance comment: +2 assist needed in standing                    Cognition Arousal/Alertness: Awake/alert Behavior During Therapy: Anxious Overall Cognitive Status: Within Functional Limits for tasks assessed                      Exercises      General Comments        Pertinent Vitals/Pain Pain Assessment: Faces Faces Pain Scale: Hurts even more Pain Location: LLE Pain Descriptors / Indicators: Grimacing;Guarding Pain Intervention(s): Limited activity within patient's tolerance;Monitored during session    Home Living                      Prior Function            PT Goals (current goals can now be found in the care plan section) Acute Rehab PT Goals Patient Stated Goal: eventually get back home PT Goal Formulation: With patient Time For Goal Achievement: 03/06/16 Potential to Achieve Goals: Fair Progress towards PT goals: Progressing toward goals    Frequency  Min 3X/week    PT Plan Frequency  needs to be updated    Co-evaluation             End of Session Equipment Utilized During Treatment: Gait belt;Left knee immobilizer Activity Tolerance: Patient limited by fatigue;Patient tolerated treatment well Patient left: in bed;with call bell/phone within reach;with SCD's reapplied     Time: 1001-1028 PT Time Calculation (min) (ACUTE ONLY): 27 min  Charges:  $Therapeutic Activity: 23-37 mins                    G Codes:      Cassell Clement, PT, CSCS Pager [phone] Office [phone]  02/22/2016, 10:41 AM

## 2016-02-22 NOTE — Discharge Summary (Addendum)
Physician Discharge Summary  [name] PYP:[id number] DOB: [birth year] DOA: 02/19/2016  PCP: Myrtis Ser, MD  Admit date: 02/19/2016 Discharge date: 02/22/2016  Recommendations for Outpatient Follow-up:  Please note we stopped metformin because of renal insufficiency. Patient can continue insulin sliding scale, Lantus 10 units daily and she can continue glipizide. We also stopped Actos. The sugars were controlled with Lantus and sliding scale insulin in hospital. Continue Lovenox for DVT prophylaxis per orthopedic surgery. Continue cipro for 7 days for Klebsiella UTI days on discharge.  Discharge Diagnoses:  Principal Problem:   Fracture, femur, distal ([place]) Active Problems:   Diabetes mellitus due to underlying condition without complications ([place])   HTN (hypertension), benign   Fall   Depression   Closed left femoral fracture, initial encounter   Femur fracture, left ([place])    Discharge Condition: stable   Diet recommendation: as tolerated   History of present illness:  65 year old female with history of hypertension, diabetes, osteoporosis, depression, presents with mechanical fall, and left supracondylar femur s/p repair by Dr. Erlinda Hong on 7/5.  Hospital Course:  Left distal femur fracture - Secondary to mechanical fall - Appreciate orthopedic surgery input - Status post repair - Stable for discharge to skilled nursing facility with Lovenox DVT prophylaxis  Diabetes mellitus without complications with long-term insulin use - Please see above recommendations for insulin regimen, Lantus 10 units daily along with sliding scale insulin and glipizide  Klebsiella UTI - Cipro on discharge for 7 days  HTN (hypertension), benign - Started Norvasc 5 mg daily  AKI - Creatinine continues to improve. Likely because of lisinopril and metformin. Both medications on hold  Depression - Resume bupropion and fluoxetine  Osteoporosis - Resume home meds     Code Status  DNR/DNI Family Communication; None at bedside  Consults   Orhto  Procedures   Retrograde nailing of left supracondylar femur fracture, without intercondylar extension by Dr Erlinda Hong on 7/5  DVT Prophylaxis  Loveno  Antibiotics    Vanco   Cipro on discharge for 7 days      Signed:  Leisa Lenz, MD  [facility] 02/22/2016, 10:22 AM  Pager #: [phone]  Time spent in minutes: more than 30 minutes   Discharge Exam: Filed Vitals:   02/22/16 0105 02/22/16 0603  BP: 126/57 145/61  Pulse: 88 89  Temp: 97.7 F (36.5 C) 98 F (36.7 C)  Resp: 16 18   Filed Vitals:   02/21/16 1534 02/21/16 2022 02/22/16 0105 02/22/16 0603  BP: 125/40 145/56 126/57 145/61  Pulse: 86 89 88 89  Temp: 97.8 F (36.6 C) 97.8 F (36.6 C) 97.7 F (36.5 C) 98 F (36.7 C)  TempSrc: Oral Oral Oral Oral  Resp: 15 16 16 18   SpO2: 98% 100% 99% 100%    General: Pt is alert, follows commands appropriately, not in acute distress Cardiovascular: Regular rate and rhythm, S1/S2 + Respiratory: Clear to auscultation bilaterally, no wheezing, no crackles, no rhonchi Abdominal: Soft, non tender, non distended, bowel sounds +, no guarding Extremities: no cyanosis, pulses palpable bilaterally DP and PT Neuro: Grossly nonfocal  Discharge Instructions  Discharge Instructions    Call MD for:  hives    Complete by:  As directed      Call MD for:  persistant dizziness or light-headedness    Complete by:  As directed      Call MD for:  persistant nausea and vomiting    Complete by:  As directed      Call  MD for:  redness, tenderness, or signs of infection (pain, swelling, redness, odor or green/yellow discharge around incision site)    Complete by:  As directed      Diet - low sodium heart healthy    Complete by:  As directed      Discharge instructions    Complete by:  As directed   Please note we stopped metformin because of renal insufficiency. Patient can continue insulin sliding scale,  Lantus 10 units daily and she can continue glipizide. We also stopped Actos. The sugars were controlled with Lantus and sliding scale insulin in hospital. Continue Lovenox for DVT prophylaxis per orthopedic surgery. Continue cipro for 7 days for Klebsiella UTI days on discharge.     Increase activity slowly    Complete by:  As directed      Non weight bearing    Complete by:  As directed             Medication List    STOP taking these medications        HYDROcodone-acetaminophen 5-325 MG tablet  Commonly known as:  NORCO/VICODIN     lisinopril 20 MG tablet  Commonly known as:  PRINIVIL,ZESTRIL     meloxicam 7.5 MG tablet  Commonly known as:  MOBIC     metFORMIN 1000 MG tablet  Commonly known as:  GLUCOPHAGE     pioglitazone 45 MG tablet  Commonly known as:  ACTOS      TAKE these medications        acetaminophen 325 MG tablet  Commonly known as:  TYLENOL  Take 650 mg by mouth every 6 (six) hours as needed for mild pain.     amLODipine 5 MG tablet  Commonly known as:  NORVASC  Take 1 tablet (5 mg total) by mouth daily.     buPROPion 300 MG 24 hr tablet  Commonly known as:  WELLBUTRIN XL  Take 300 mg by mouth daily.     calcium-vitamin D 500-200 MG-UNIT tablet  Commonly known as:  OSCAL WITH D  Take 1 tablet by mouth 2 (two) times daily.     ciprofloxacin 500 MG tablet  Commonly known as:  CIPRO  Take 1 tablet (500 mg total) by mouth 2 (two) times daily.     clonazePAM 1 MG tablet  Commonly known as:  KLONOPIN  Take 1 tablet (1 mg total) by mouth at bedtime.     enoxaparin 40 MG/0.4ML injection  Commonly known as:  LOVENOX  Inject 0.4 mLs (40 mg total) into the skin daily.     FLUoxetine 20 MG capsule  Commonly known as:  PROZAC  Take 40 mg by mouth daily.     glipiZIDE 10 MG tablet  Commonly known as:  GLUCOTROL  Take 10 mg by mouth 2 (two) times daily.     insulin aspart 100 UNIT/ML injection  Commonly known as:  novoLOG  Inject 0-15 Units into  the skin 3 (three) times daily with meals.     insulin glargine 100 UNIT/ML injection  Commonly known as:  LANTUS  Inject 0.1 mLs (10 Units total) into the skin daily.     oxyCODONE 5 MG immediate release tablet  Commonly known as:  Oxy IR/ROXICODONE  Take 1-3 tablets (5-15 mg total) by mouth every 4 (four) hours as needed.     Vitamin D (Ergocalciferol) 50000 units Caps capsule  Commonly known as:  DRISDOL  Take 50,000 Units by mouth every 7 (seven) days.  Follow-up Information    Follow up with Marianna Payment, MD In 2 weeks.   Specialty:  Orthopedic Surgery   Why:  For suture removal, For wound re-check   Contact information:   [address] [place] [address] [phone]       Follow up with Myrtis Ser, MD. Schedule an appointment as soon as possible for a visit in 1 week.   Specialty:  Internal Medicine   Why:  Follow up appt after recent hospitalization   Contact information:   [place] [address] [phone]        The results of significant diagnostics from this hospitalization (including imaging, microbiology, ancillary and laboratory) are listed below for reference.    Significant Diagnostic Studies: Dg Chest 2 View  02/19/2016  CLINICAL DATA:  Pain following fall.  Hypertension. EXAM: CHEST  2 VIEW COMPARISON:  Chest radiograph April 09, 2011 and chest CT July 28, 2013 FINDINGS: There is no edema or consolidation. Heart is upper normal in size with pulmonary vascularity within normal limits. There is atherosclerotic calcification in the aorta. There is mitral annulus calcification. There is no demonstrable adenopathy. There is evidence of old trauma with remodeling involving the proximal left humerus. There old healed rib fractures on the right. IMPRESSION: No edema or consolidation.  There is aortic atherosclerosis. Electronically Signed   By: Lowella Grip III M.D.   On: 02/19/2016 15:55    Dg Pelvis 1-2 Views  02/19/2016  CLINICAL DATA:  Patient fell today from standing position. Pt c/o severe left leg pain, left generalized arm pain, and right toe pain. EXAM: PELVIS - 1-2 VIEW COMPARISON:  None. FINDINGS: No fracture.  No bone lesion.  Bones are demineralized. Hip joints, SI joints and symphysis pubis are normally aligned. IMPRESSION: No fracture or dislocation. Electronically Signed   By: Lajean Manes M.D.   On: 02/19/2016 14:40   Dg Forearm Left  02/19/2016  CLINICAL DATA:  Patient fell today from standing position. Pt c/o severe left leg pain, left generalized arm pain, and right toe pain. EXAM: LEFT FOREARM - 2 VIEW COMPARISON:  None. FINDINGS: No fracture.  No dislocation.  Bones are demineralized. There is soft tissue swelling along the radial aspect of the distal forearm. No radiopaque foreign body. IMPRESSION: No fracture or dislocation. Electronically Signed   By: Lajean Manes M.D.   On: 02/19/2016 14:37   Dg Tibia/fibula Left  02/19/2016  CLINICAL DATA:  Patient fell today from standing position. Pt c/o severe left leg pain, left generalized arm pain, and right toe pain. EXAM: LEFT TIBIA AND FIBULA - 2 VIEW COMPARISON:  None. FINDINGS: No fracture.  No bone lesion. There are advanced degenerative changes of the knee with marked medial compartment narrowing. Ankle joint is normally spaced and aligned. C/o Bones are demineralized. There are soft tissue and vascular calcifications along the left leg with multiple vascular clips from previous the umbilical vein surgery. Small IMPRESSION: 1. No acute fracture or dislocation. Electronically Signed   By: Lajean Manes M.D.   On: 02/19/2016 14:39   Ct Knee Left Wo Contrast  02/19/2016  CLINICAL DATA:  Fall from standing position. Severe pain to left knee, toes on left and right feet. EXAM: CT OF THE LEFT KNEE WITHOUT CONTRAST TECHNIQUE: Multidetector CT imaging of the LEFT knee was performed according to the standard protocol.  Multiplanar CT image reconstructions were also generated. COMPARISON:  None. FINDINGS: There is a markedly displaced/comminuted fracture within  the distal left femur, with significant impaction at the fracture site and anterior-lateral angulation at the fracture site. Fracture does not appear to extend to the articular surface of either femoral condyles. No fracture seen within the proximal tibia or fibula. Extensive degenerative change at the right knee, tricompartmental, with most severe joint space narrowing and osteophyte formation at the medial compartment with abutment of the medial femoral head aunt tibial plateau. Joint effusion is present, most prominent within the suprapatellar bursa, moderate in size. Superficial soft tissues are unremarkable. IMPRESSION: 1. Markedly displaced/comminuted fracture of the distal left femur, centered at the distal metaphysis, with prominent impaction at the fracture site and anterior-lateral angulation at the fracture site. Fracture does not appear to extend to the articular surface of either femoral condyle. 2. 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 3. Joint effusion. Electronically Signed   By: Franki Cabot M.D.   On: 02/19/2016 16:35   Dg Humerus Left  02/19/2016  CLINICAL DATA:  Fall.  Severe left leg pain.  Left arm pain. EXAM: LEFT HUMERUS - 2+ VIEW COMPARISON:  12/24/2008 FINDINGS: Old healed fracture within the proximal left humerus. No acute fracture. No subluxation or dislocation. IMPRESSION: No acute bony abnormality. Electronically Signed   By: Rolm Baptise M.D.   On: 02/19/2016 14:31   Dg Foot Complete Left  02/19/2016  CLINICAL DATA:  Patient fell today from standing position. Pt c/o severe left leg pain, left generalized arm pain, and right toe pain. EXAM: LEFT FOOT - COMPLETE 3+ VIEW COMPARISON:  None. FINDINGS: There is a nondisplaced, non  comminuted fracture across the medial base of the proximal phalanx of the great toe. No other acute fracture.  No dislocation. Bones are diffusely demineralized. IMPRESSION: Nondisplaced fracture of the medial base of the proximal phalanx of left great toe. Electronically Signed   By: Lajean Manes M.D.   On: 02/19/2016 14:34   Dg Foot Complete Right  02/19/2016  CLINICAL DATA:  Patient fell today from standing position. Pt c/o severe left leg pain, left generalized arm pain, and right toe pain. EXAM: RIGHT FOOT COMPLETE - 3+ VIEW COMPARISON:  09/30/2013 FINDINGS: There is nondisplaced fracture across the lateral base of the proximal phalanx of the great toe. No other acute fractures. Old healed fracture of the proximal first metatarsal. Joints are normally aligned. Bones are diffusely demineralized. IMPRESSION: 1. Nondisplaced fracture across the lateral base of the proximal phalanx of the right great toe. No other fractures. No dislocation. Electronically Signed   By: Lajean Manes M.D.   On: 02/19/2016 14:36   Dg C-arm 1-60 Min  02/20/2016  CLINICAL DATA:  Post left femur fracture repair EXAM: LEFT FEMUR 2 VIEWS; DG C-ARM 61-120 MIN COMPARISON:  02/19/2016 FINDINGS: The patient is status post open reduction internal fixation of distal left femoral fracture. There is intra medullary rod in left femur. At least three locking screws are noted in distal left femur. There is improvement with near anatomic alignment. IMPRESSION: Status post intraoperative repair of distal left femoral fracture. Intra medullary rod and locking screws are noted in left femur. There is improvement near anatomic alignment. Fluoroscopy time was 3 minutes 12 seconds. Please see the operative report. Electronically Signed   By: Lahoma Crocker M.D.   On: 02/20/2016 12:37   Dg Femur Min 2 Views Left  02/20/2016  CLINICAL DATA:  Post left femur fracture repair EXAM: LEFT  FEMUR 2 VIEWS; DG C-ARM 61-120 MIN COMPARISON:  02/19/2016 FINDINGS: The  patient is status post open reduction internal fixation of distal left femoral fracture. There is intra medullary rod in left femur. At least three locking screws are noted in distal left femur. There is improvement with near anatomic alignment. IMPRESSION: Status post intraoperative repair of distal left femoral fracture. Intra medullary rod and locking screws are noted in left femur. There is improvement near anatomic alignment. Fluoroscopy time was 3 minutes 12 seconds. Please see the operative report. Electronically Signed   By: Lahoma Crocker M.D.   On: 02/20/2016 12:37   Dg Femur Min 2 Views Left  02/19/2016  CLINICAL DATA:  Patient fell today from standing position. Pt c/o severe left leg pain, left generalized arm pain, and right toe pain. EXAM: LEFT FEMUR 2 VIEWS COMPARISON:  None. FINDINGS: There is an oblique fracture of the distal femur. Fracture is mildly comminuted. Fracture is also mildly displaced with the distal primary fracture component telescoping on the shaft component by 2.7 cm. There is approximate 1 cm of lateral displacement. There is mild posterior angulation. No other fracture.  No dislocation. Bones are demineralized. IMPRESSION: 1. Fracture of the distal left femur as described.  No dislocation. Electronically Signed   By: Lajean Manes M.D.   On: 02/19/2016 14:33    Microbiology: Recent Results (from the past 240 hour(s))  Urine culture     Status: Abnormal   Collection Time: 02/19/16  9:13 PM  Result Value Ref Range Status   Specimen Description URINE, CATHETERIZED  Final   Special Requests none  Final   Culture >=100,000 COLONIES/mL KLEBSIELLA PNEUMONIAE (A)  Final   Report Status 02/22/2016 FINAL  Final   Organism ID, Bacteria KLEBSIELLA PNEUMONIAE (A)  Final      Susceptibility   Klebsiella pneumoniae - MIC*    AMPICILLIN >=32 RESISTANT Resistant     CEFAZOLIN <=4 SENSITIVE Sensitive     CEFTRIAXONE <=1 SENSITIVE Sensitive     CIPROFLOXACIN <=0.25 SENSITIVE Sensitive      GENTAMICIN <=1 SENSITIVE Sensitive     IMIPENEM <=0.25 SENSITIVE Sensitive     NITROFURANTOIN 32 SENSITIVE Sensitive     TRIMETH/SULFA <=20 SENSITIVE Sensitive     AMPICILLIN/SULBACTAM 4 SENSITIVE Sensitive     PIP/TAZO 16 SENSITIVE Sensitive     * >=100,000 COLONIES/mL KLEBSIELLA PNEUMONIAE  Surgical pcr screen     Status: Abnormal   Collection Time: 02/19/16  9:47 PM  Result Value Ref Range Status   MRSA, PCR NEGATIVE NEGATIVE Final   Staphylococcus aureus POSITIVE (A) NEGATIVE Final     Labs: Basic Metabolic Panel:  Recent Labs Lab 02/19/16 1514 02/19/16 1534 02/20/16 0001 02/20/16 1625 02/21/16 0629 02/22/16 0739  NA 138 137 137  --  135 135  K 4.5 4.5 4.6  --  4.7 4.6  CL 104 104 105  --  106 106  CO2 22  --  26  --  23 23  GLUCOSE 270* 280* 139*  --  188* 169*  BUN 25* 28* 24*  --  38* 34*  CREATININE 0.85 0.80 0.94 1.41* 1.58* 1.16*  CALCIUM 9.1  --  9.0  --  8.4* 9.0   Liver Function Tests: No results for input(s): AST, ALT, ALKPHOS, BILITOT, PROT, ALBUMIN in the last 168 hours. No results for input(s): LIPASE, AMYLASE in the last 168 hours. No results for input(s): AMMONIA in the last 168 hours. CBC:  Recent Labs Lab 02/19/16 1514  02/19/16 1534 02/20/16 0001 02/20/16 1625 02/21/16 0350 02/22/16 0739  WBC 15.1*  --  12.0* 13.9* 11.9* 9.1  NEUTROABS 13.9*  --   --   --   --   --   HGB 10.7* 11.9* 10.3* 8.9* 8.8* 8.4*  HCT 34.5* 35.0* 34.3* 29.5* 29.3* 27.1*  MCV 92.5  --  92.7 92.2 92.1 91.6  PLT 289  --  329 267 244 253   Cardiac Enzymes: No results for input(s): CKTOTAL, CKMB, CKMBINDEX, TROPONINI in the last 168 hours. BNP: BNP (last 3 results) No results for input(s): BNP in the last 8760 hours.  ProBNP (last 3 results) No results for input(s): PROBNP in the last 8760 hours.  CBG:  Recent Labs Lab 02/21/16 0620 02/21/16 1212 02/21/16 1639 02/21/16 2156 02/22/16 0606  GLUCAP 185* 167* 138* 131* 144*

## 2016-02-22 NOTE — Clinical Social Work Placement (Signed)
   CLINICAL SOCIAL WORK PLACEMENT  NOTE  Date:  02/22/2016  Patient Details  Name: [name] MRN: [id number] Date of Birth: Jun 08, 1951  Clinical Social Work is seeking post-discharge placement for this patient at the [place] level of care (*CSW will initial, date and re-position this form in  chart as items are completed):  Yes   Patient/family provided with [place] Work Department's list of facilities offering this level of care within the geographic area requested by the patient (or if unable, by the patient's family).  Yes   Patient/family informed of their freedom to choose among providers that offer the needed level of care, that participate in Medicare, Medicaid or managed care program needed by the patient, have an available bed and are willing to accept the patient.  Yes   Patient/family informed of [place]'s ownership interest in [facility] and [facility], as well as of the fact that they are under no obligation to receive care at these facilities.  PASRR submitted to EDS on 02/22/16     PASRR number received on 02/22/16     Existing PASRR number confirmed on       FL2 transmitted to all facilities in geographic area requested by pt/family on 02/22/16     FL2 transmitted to all facilities within larger geographic area on       Patient informed that his/her managed care company has contracts with or will negotiate with certain facilities, including the following:        Yes   Patient/family informed of bed offers received.  Patient chooses bed at [facility]     Physician recommends and patient chooses bed at      Patient to be transferred to [facility] on 02/22/16.  Patient to be transferred to facility by [facility]     Patient family notified on 02/22/16 of transfer.  Name of family member notified:  Patient     PHYSICIAN       Additional Comment:     _______________________________________________ Caroline Sauger, LCSW 02/22/2016, 12:06 PM

## 2016-02-22 NOTE — Progress Notes (Signed)
Occupational Therapy Treatment Patient Details Name: [name] MRN: [id number] DOB: 09-28-1950 Today's Date: 02/22/2016    History of present illness 65 y.o. female who presents with left distal femur fracture due to mechanical fall, now s/p retrograde nail. Pt also sustaining bilateral great toe fractures. ONG:[id number], hypertension, depression.     OT comments  Pt limited by pain, weakness and low endurance. Pt declined [facility] transfers with OT and nurse tech assist. Pt requested bad pan. Pt states that she will be d/c today to [facility] for rehab  Follow Up Recommendations  SNF;Supervision/Assistance - 24 hour    Equipment Recommendations  None recommended by OT    Recommendations for Other Services      Precautions / Restrictions Precautions Precautions: Fall Precaution Comments: bilateral great toe fractures Required Braces or Orthoses: Knee Immobilizer - Left Restrictions Weight Bearing Restrictions: Yes LLE Weight Bearing: Non weight bearing       Mobility Bed Mobility Overal bed mobility: Needs Assistance Bed Mobility: Rolling Rolling: Mod assist   Supine to sit: Max assist Sit to supine: Total assist;+2 for physical assistance      Transfers Overall transfer level:  (pt declined transfers to [facility])                    Balance   Sitting-balance support: No upper extremity supported;Feet supported Sitting balance-Leahy Scale: Good                             ADL       Grooming: Wash/dry hands;Wash/dry face;Sitting;Min guard   Upper Body Bathing: Sitting;Minimal assitance       Upper Body Dressing : Sitting;Minimal assistance         Toilet Transfer Details (indicate cue type and reason): Pt declined sit - stand to attempt [facility] transfer. Pt requested bed pan Toileting- Clothing Manipulation and Hygiene: Total assistance;Bed level                Vision  no change from baseline                               Cognition   Behavior During Therapy: Anxious Overall Cognitive Status: Within Functional Limits for tasks assessed                       Extremity/Trunk Assessment   generalized weakness                        General Comments  Pt pleasant     Pertinent Vitals/ Pain       Pain Assessment: 0-10 Pain Score: 5  Pain Location: L LE Pain Descriptors / Indicators: Moaning;Grimacing;Guarding Pain Intervention(s): Limited activity within patient's tolerance;Monitored during session;Repositioned  Home Living  with husband and 71 y/o grand daughter                                        Prior Functioning/Environment  independent            Frequency Min 2X/week     Progress Toward Goals  OT Goals(current goals can now be found in the care plan section)        Plan Discharge plan remains appropriate  End of Session Equipment Utilized During Treatment: Gait belt;Rolling walker   Activity Tolerance Patient limited by pain   Patient Left in bed;with call bell/phone within reach;with nursing/sitter in room                   Charges: OT General Charges $OT Visit: 1 Procedure OT Treatments $Therapeutic Activity: 8-22 mins  Britt Bottom 02/22/2016, 2:49 PM

## 2016-02-22 NOTE — Clinical Social Work Note (Signed)
PASARR: [phone] [name], [place] [facility]: [phone] Surgical: [phone]

## 2016-02-22 NOTE — Discharge Instructions (Signed)
° ° °  1. Change dressings as needed 2. May shower but keep incisions covered and dry 3. Take lovenox to prevent blood clots 4. Take stool softeners as needed 5. Take pain meds as needed

## 2016-02-22 NOTE — Progress Notes (Signed)
[name] to be D/C'd Skilled nursing facility per MD order.  Discussed with the patient and all questions fully answered.  VSS, Skin clean, dry and intact without evidence of skin break down, no evidence of skin tears noted. IV catheter discontinued intact. Site without signs and symptoms of complications. Dressing and pressure applied.  An After Visit Summary was printed and given to the patient. Patient received prescription.  D/c education completed with patient/family including follow up instructions, medication list, d/c activities limitations if indicated, with other d/c instructions as indicated by MD - patient able to verbalize understanding, all questions fully answered.   Patient instructed to return to ED, call 911, or call MD for any changes in condition.   Patient escorted via [facility].  Jerry Caras 02/22/2016 12:56 PM

## 2016-02-22 NOTE — Clinical Social Work Note (Addendum)
Patient will discharge today per MD order. Patient will discharge to: [facility] SN[facility] RN to call report prior to transportation to: [phone] Transportation: [facility]  CSW sent discharge summary to SNF for review.  CSW contacted patient's husband Jenny Reichmann to advise of discharge plans- CSW left detailed message.  Patient and RN updated.  Nonnie Done, LCSW [phone]  [id number], 2S 15-16 and Psychiatric Service Line  Licensed Clinical Social Worker

## 2016-02-25 ENCOUNTER — Encounter (HOSPITAL_COMMUNITY)
Admission: RE | Admit: 2016-02-25 | Discharge: 2016-02-25 | Disposition: A | Discharge status: Home / Self Care | Payer: Medicare Other | Source: Skilled Nursing Facility | Attending: Internal Medicine | Admitting: Internal Medicine

## 2016-02-25 DIAGNOSIS — I10 Essential (primary) hypertension: Secondary | ICD-10-CM | POA: Insufficient documentation

## 2016-02-25 DIAGNOSIS — Z4789 Encounter for other orthopedic aftercare: Secondary | ICD-10-CM | POA: Insufficient documentation

## 2016-02-25 DIAGNOSIS — E119 Type 2 diabetes mellitus without complications: Secondary | ICD-10-CM | POA: Insufficient documentation

## 2016-02-25 DIAGNOSIS — F329 Major depressive disorder, single episode, unspecified: Secondary | ICD-10-CM | POA: Insufficient documentation

## 2016-02-25 DIAGNOSIS — F064 Anxiety disorder due to known physiological condition: Secondary | ICD-10-CM | POA: Insufficient documentation

## 2016-02-25 DIAGNOSIS — Z9181 History of falling: Secondary | ICD-10-CM | POA: Insufficient documentation

## 2016-02-25 LAB — BASIC METABOLIC PANEL
ANION GAP: 6 (ref 5–15)
BUN: 21 mg/dL — ABNORMAL HIGH (ref 6–20)
CALCIUM: 8.8 mg/dL — AB (ref 8.9–10.3)
CO2: 28 mmol/L (ref 22–32)
Chloride: 103 mmol/L (ref 101–111)
Creatinine, Ser: 0.86 mg/dL (ref 0.44–1.00)
Glucose, Bld: 166 mg/dL — ABNORMAL HIGH (ref 65–99)
Potassium: 3.9 mmol/L (ref 3.5–5.1)
Sodium: 137 mmol/L (ref 135–145)

## 2016-02-25 LAB — CBC
HCT: 28.1 % — ABNORMAL LOW (ref 36.0–46.0)
HEMOGLOBIN: 8.7 g/dL — AB (ref 12.0–15.0)
MCH: 28.1 pg (ref 26.0–34.0)
MCHC: 31 g/dL (ref 30.0–36.0)
MCV: 90.6 fL (ref 78.0–100.0)
Platelets: 340 10*3/uL (ref 150–400)
RBC: 3.1 MIL/uL — AB (ref 3.87–5.11)
RDW: 13.7 % (ref 11.5–15.5)
WBC: 8.6 10*3/uL (ref 4.0–10.5)

## 2016-02-26 ENCOUNTER — Encounter: Payer: Self-pay | Admitting: Internal Medicine

## 2016-02-26 ENCOUNTER — Non-Acute Institutional Stay (SKILLED_NURSING_FACILITY): Payer: BLUE CROSS/BLUE SHIELD | Admitting: Internal Medicine

## 2016-02-26 DIAGNOSIS — E1129 Type 2 diabetes mellitus with other diabetic kidney complication: Secondary | ICD-10-CM | POA: Diagnosis not present

## 2016-02-26 DIAGNOSIS — S72402D Unspecified fracture of lower end of left femur, subsequent encounter for closed fracture with routine healing: Secondary | ICD-10-CM | POA: Diagnosis not present

## 2016-02-26 DIAGNOSIS — F329 Major depressive disorder, single episode, unspecified: Secondary | ICD-10-CM

## 2016-02-26 DIAGNOSIS — I10 Essential (primary) hypertension: Secondary | ICD-10-CM

## 2016-02-26 DIAGNOSIS — Z8614 Personal history of Methicillin resistant Staphylococcus aureus infection: Secondary | ICD-10-CM | POA: Insufficient documentation

## 2016-02-26 DIAGNOSIS — N289 Disorder of kidney and ureter, unspecified: Secondary | ICD-10-CM | POA: Insufficient documentation

## 2016-02-26 DIAGNOSIS — IMO0002 Reserved for concepts with insufficient information to code with codable children: Secondary | ICD-10-CM

## 2016-02-26 DIAGNOSIS — F32A Depression, unspecified: Secondary | ICD-10-CM

## 2016-02-26 DIAGNOSIS — E1165 Type 2 diabetes mellitus with hyperglycemia: Secondary | ICD-10-CM

## 2016-02-26 NOTE — Progress Notes (Signed)
Facility Location: [facility] Room Number: 123-P   PCP: [facility], [place] , [place]    This is a comprehensive admission note to [place] personally performed by Unice Cobble MD on this date less than 30 days from date of admission. Included are preadmission medical/surgical history;reconciled medication list; family history; social history and comprehensive review of systems.  Corrections and additions to the records were documented . Comprehensive physical exam was also performed. Additionally a clinical summary was entered for each active diagnosis pertinent to this admission in the Problem List to enhance continuity of care.   HPI: The patient was hospitalized 7/4-02/22/16 for distal femur fracture ,status post left supracondylar femur IM  7/5. The fracture was in the context of a mechanical fall without cardiac or neurologic prodrome. She simply tripped over the doorstep. Hospital course was complicated by renal insufficiency ; metformin and lisinopril were discontinued. Basal and sliding scale insulin were initiated along with the sulfonylurea. She also had a Klebsiella urinary tract infection for which Cipro was to be continued for 7 days.  Off the ACE-I she was significantly hypertensive and amlodipine 5 mg daily was initiated.  Past medical and surgical history: Other medical diagnoses include depression & osteoporosis.  Social history: Updated with summary in Problem List and under PMH  Family history: No data in Epic; data entered   Comprehensive review of systems: Glucoses were rarely checked at home. Fasting glucoses ranged 89-250. She describes frequency for at least 2 months with nocturia 4-5 times per day. She also describes incontinence. She described numbness in her feet after prolonged standing but no persistent symptoms She has not had an A1c for at least 6 months. She has never been on insulin but took 3 oral agents, metformin, Actos and  Glipizide. She was seen by her Gyn 2-3 months ago for apparent yeast infection. She admits to depression related to her family situation. Her best friend was her mother; her mother died of breast cancer. Her daughter,mother of 4 children, is in prison related to drug use. One of the four children lives with her. The other 3 are in foster care. She continues to have pain at the radial aspect of the right wrist. Films at admission 7/4 revealed no fracture; there was soft tissue swelling.  Constitutional: No fever,significant weight change, fatigue  Eyes: No redness, discharge, pain, vision change ENT/mouth: No nasal congestion,  purulent discharge, earache,change in hearing ,sore throat  Cardiovascular: No chest pain, palpitations,paroxysmal nocturnal dyspnea, claudication, edema  Respiratory: No cough, sputum production,hemoptysis, DOE , significant snoring,apnea   Gastrointestinal: No heartburn,dysphagia,abdominal pain, nausea / vomiting,rectal bleeding, melena,change in bowels Genitourinary: No dysuria,hematuria, pyuria Musculoskeletal: No joint stiffness, joint swelling, weakness Dermatologic: No rash, pruritus, change in appearance of skin Neurologic: No dizziness,headache,syncope, seizures, tingling Endocrine: No change in hair/skin/ nails, excessive thirst, excessive hunger  Hematologic/lymphatic: No significant bruising, lymphadenopathy,abnormal bleeding Allergy/immunology: No itchy/ watery eyes, significant sneezing, urticaria, angioedema  Physical exam:  Pertinent or positive findings:Morbid obesity is present. Abdomen is massively protuberant. She has bruising over the base of the toes as well as over the dorsal upper extremities. Pes planus is present. The left lower extremity is in a soft brace. She has varicose veins  in the right lower extremity. She became tearful as she described her family situation ; the reason she had chosen to be DO NOT RESUSCITATE   General  appearance:Adequately nourished; no acute distress , increased work of breathing is present.  Lymphatic: No lymphadenopathy about the head, neck, axilla . Eyes: No conjunctival inflammation or lid edema is present. There is no scleral icterus. Ears:  External ear exam shows no significant lesions or deformities.   Nose:  External nasal examination shows no deformity or inflammation. Nasal mucosa are pink and moist without lesions ,exudates Oral exam: lips and gums are healthy appearing.There is no oropharyngeal erythema or exudate . Neck:  No thyromegaly, masses, tenderness noted.    Heart:  Normal rate and regular rhythm. S1 and S2 normal without gallop, murmur, click, rub .  Lungs:Chest clear to auscultation without wheezes, rhonchi,rales , rubs. Abdomen:Bowel sounds are normal. Abdomen is soft and nontender with no organomegaly, hernias,masses. GU: deferred . Extremities:  No cyanosis, clubbing,edema  Neurologic exam : Strength equal  in upper extremities Balance,Rhomberg,finger to nose testing could not be completed due to clinical state Skin: Warm & dry w/o tenting. No significant lesions or rash.  See clinical summary under each active problem in the Problem List with associated updated therapeutic plan

## 2016-02-26 NOTE — Assessment & Plan Note (Signed)
Titrate Lantus to fasting blood sugars less than 200 Address highest postprandial glucose once fasting glucose < 200 or Lantus dose greater than 50 units daily

## 2016-02-26 NOTE — Patient Instructions (Addendum)
New orders for Matrix entry. Restart ACE-I A1c Titrate Lantus to fasting blood sugars averaging less than 200 then address highest postprandial glucose once fasting glucose average < 200 or Lantus dose greater than 50 units daily

## 2016-02-26 NOTE — Assessment & Plan Note (Signed)
Restart ACE inhibitor or ARB once renal function normalizes as per standard of care for diabetes

## 2016-02-26 NOTE — Assessment & Plan Note (Signed)
PT at [facility]

## 2016-02-26 NOTE — Assessment & Plan Note (Addendum)
Renal function now normal restart ACE inhibitor

## 2016-02-26 NOTE — Assessment & Plan Note (Signed)
Continue Fluoxetine

## 2016-02-27 ENCOUNTER — Encounter (HOSPITAL_COMMUNITY)
Admission: RE | Admit: 2016-02-27 | Discharge: 2016-02-27 | Disposition: A | Discharge status: Home / Self Care | Payer: Medicare Other | Source: Skilled Nursing Facility | Attending: Internal Medicine | Admitting: Internal Medicine

## 2016-02-28 DIAGNOSIS — S72452D Displaced supracondylar fracture without intracondylar extension of lower end of left femur, subsequent encounter for closed fracture with routine healing: Secondary | ICD-10-CM | POA: Diagnosis not present

## 2016-02-28 DIAGNOSIS — M25532 Pain in left wrist: Secondary | ICD-10-CM | POA: Diagnosis not present

## 2016-02-28 LAB — HEMOGLOBIN A1C
HEMOGLOBIN A1C: 7 % — AB (ref 4.8–5.6)
MEAN PLASMA GLUCOSE: 154 mg/dL

## 2016-03-04 ENCOUNTER — Non-Acute Institutional Stay (SKILLED_NURSING_FACILITY): Payer: BLUE CROSS/BLUE SHIELD | Admitting: Internal Medicine

## 2016-03-04 ENCOUNTER — Other Ambulatory Visit: Payer: Self-pay

## 2016-03-04 ENCOUNTER — Encounter: Payer: Self-pay | Admitting: Internal Medicine

## 2016-03-04 DIAGNOSIS — I10 Essential (primary) hypertension: Secondary | ICD-10-CM | POA: Diagnosis not present

## 2016-03-04 DIAGNOSIS — M62838 Other muscle spasm: Secondary | ICD-10-CM | POA: Insufficient documentation

## 2016-03-04 DIAGNOSIS — S72002D Fracture of unspecified part of neck of left femur, subsequent encounter for closed fracture with routine healing: Secondary | ICD-10-CM | POA: Diagnosis not present

## 2016-03-04 NOTE — Progress Notes (Signed)
Location:   [facility] Room Number: 126/P Place of Service:  SNF (31) Provider:  [place] [facility]  No primary care provider on file.  No care team member to display  Extended Emergency Contact Information Primary Emergency Contact: Seabury,John R Address: [address]          [place], [place] [address] [place] Phone: [phone] Mobile Phone: [phone] Relation: Spouse  Code Status:  DNR Goals of care: Advanced Directive information Advanced Directives 03/04/2016  Does patient have an advance directive? Yes  Type of Advance Directive Out of facility DNR (pink MOST or yellow form)  Does patient want to make changes to advanced directive? No - Patient declined  Copy of advanced directive(s) in chart? Yes     Chief Complaint  Patient presents with  . Acute Visit    Patients c/o Has been having trouble with cramping of legs and feet at night    HPI:  Pt is a 64 y.o. female seen today for an acute visit for Complaints of cramping this is mainly in her right foot and appears intermittently including at night.  She does have a history of a left femur fracture status post repair-she states she is doing relatively well with this but what appears to be some cramping of her right foot is quite painful at times when she would like something for this.  Otherwise patient has no complaints she does complain of feeling chilly but she says this is not new and has been going on for some time.  She also states she's been told in the past she is vitamin D deficient-  I have reviewed her labs in Epic and note that her TSH back on 02/19/2016 was within normal limits at 1.496-T4 was 1.0.  Patient also has a history of hypertension-she is on Norvasc 5 mg a day-there were concerns initially with her renal function but this appears to have normalized in her ACE inhibitor has been started lisinopril 20 mg a day this appears to be having a beneficial effect  with recent blood pressures 130/58-138/76    Past Medical History  Diagnosis Date  . Diabetes mellitus without complication ([place])   . Hypertension   . Depression   . Osteoporosis   . UTI (lower urinary tract infection) 01/2016    Cipro for Klebsiella pneumoniae isolate   Past Surgical History  Procedure Laterality Date  . Abdominal hysterectomy    . Cesarean section    . Cholecystectomy    . Pancreas surgery  1967    1 cyst excised and one cyst drained  . Femur im nail Left 02/20/2016  . Vein ligation and stripping    . Wrist fracture surgery    . Femur im nail Left 02/20/2016    Procedure: INTRAMEDULLARY (IM) RETROGRADE FEMORAL NAILING;  Surgeon: Leandrew Koyanagi, MD;  Location: [facility];  Service: Orthopedics;  Laterality: Left;    No Known Allergies  Current Outpatient Prescriptions on File Prior to Visit  Medication Sig Dispense Refill  . acetaminophen (TYLENOL) 325 MG tablet Take 650 mg by mouth every 6 (six) hours as needed for mild pain.    Marland Kitchen amLODipine (NORVASC) 5 MG tablet Take 1 tablet (5 mg total) by mouth daily. 30 tablet 0  . buPROPion (WELLBUTRIN XL) 300 MG 24 hr tablet Take 300 mg by mouth daily.    . calcium-vitamin D (OSCAL WITH D) 500-200 MG-UNIT tablet Take 1 tablet by mouth 2 (two) times  daily. 30 tablet 0  . clonazePAM (KLONOPIN) 1 MG tablet Take 1 tablet (1 mg total) by mouth at bedtime. 30 tablet 5  . enoxaparin (LOVENOX) 40 MG/0.4ML injection Inject 0.4 mLs (40 mg total) into the skin daily. 14 Syringe 0  . FLUoxetine (PROZAC) 40 MG capsule Take 40 mg by mouth daily.    Marland Kitchen glipiZIDE (GLUCOTROL) 10 MG tablet Take 10 mg by mouth 2 (two) times daily.    . insulin glargine (LANTUS) 100 UNIT/ML injection Inject 0.15 mLs (15 Units total) into the skin daily. 10 mL 11  . oxyCODONE (OXY IR/ROXICODONE) 5 MG immediate release tablet Take 1-3 tablets (5-15 mg total) by mouth every 4 (four) hours as needed. 90 tablet 0  . Vitamin D, Ergocalciferol, (DRISDOL) 50000 units CAPS  capsule Take 50,000 Units by mouth every 7 (seven) days.     No current facility-administered medications on file prior to visit.     Review of Systems   General no complaints of fever says she does feel chilly at times but this is been going on for quite a while.  Skin does not complain of rashes or itching.  Head ears eyes nose mouth throat no complaints of sore throat or visual changes.  Respiratory is not complaining shortness breath or cough.  Cardiac no chest pain.  GI is not complaining of abdominal discomfort nausea vomiting diarrhea constipation does state her appetite is not great however.  Musculoskeletal main complaint again is what appears to be tight gripping type pain which is transitory mainly in her right foot.  Neurologic is not complaining of dizziness or headache does have some history of numbness lower extremities.  And psych is not complaining currently of anxiety or depression but does have some history of depression    Immunization History  Administered Date(s) Administered  . PPD Test 02/22/2016   Pertinent  Health Maintenance Due  Topic Date Due  . FOOT EXAM  03/04/2017 (Originally 12/19/1960)  . OPHTHALMOLOGY EXAM  03/04/2017 (Originally 12/19/1960)  . URINE MICROALBUMIN  03/04/2017 (Originally 12/19/1960)  . DEXA SCAN  03/04/2017 (Originally 12/20/2015)  . PNA vac Low Risk Adult (1 of 2 - PCV13) 03/04/2017 (Originally 12/20/2015)  . PAP SMEAR  03/05/2019 (Originally 12/20/1971)  . COLONOSCOPY  03/04/2026 (Originally 12/19/2000)  . INFLUENZA VACCINE  03/18/2016  . HEMOGLOBIN A1C  08/29/2016  . MAMMOGRAM  09/07/2016   No flowsheet data found. Functional Status Survey:    Filed Vitals:   03/04/16 1022  BP: 142/62  Pulse: 76  Temp: 97.6 F (36.4 C)  TempSrc: Oral  Resp: 20  Height: 5' 3"  (1.6 m)  Weight: 270 lb (122.471 kg)  Note other recent blood pressures 130/58-138/76 Body mass index is 47.84 kg/(m^2). Physical Exam   This is a pleasant  female in no distress sitting comfortably in wheelchair.  Her skin is warm and dry she does have some bruising over the base of her toes bilaterally as well as over the dorsal aspect of her hands bilaterally.  Eyes pupils appear reactive light sclera and icteric clear visual acuity appears grossly intact.  Oropharynx clear mucous membranes moist.  Chest is clear to auscultation there is no labored breathing.  Heart is regular rate and rhythm without murmur gallop or rub she does not really have significant lower extremity edema pedal pulses are intact bilaterally.  Abdomen soft obese soft nontender positive bowel sounds.  Musculoskeletal Limited movement of her left lower extremity status post recent surgery there is dressing over  the surgical site left hip-upper extremities strength appears to be intact she does have a tremor of her left upper extremity that is intermittent.  There is some tenderness to palpation of her left dorsal aspect of the hand this is not new according the patient.  Right foot-when I was in the room patient did appear to have somewhat of a cramping gripping type pain of her right foot that was a fairly short duration but fairly intense--I did not note any increased erythema or edema or warmth-again she does have some bruising more so the distal aspect of her feet bilaterally  Neurologic-again does have the tremor of her left upper extremity as noted above-cranial nerves are grossly intact her speech is clear strength appears relatively intact all 4 extremities.  Psych she is alert and oriented pleasant and appropriate  Labs reviewed:  Recent Labs  02/21/16 0629 02/22/16 0739 02/25/16 0530  NA 135 135 137  K 4.7 4.6 3.9  CL 106 106 103  CO2 23 23 28   GLUCOSE 188* 169* 166*  BUN 38* 34* 21*  CREATININE 1.58* 1.16* 0.86  CALCIUM 8.4* 9.0 8.8*   No results for input(s): AST, ALT, ALKPHOS, BILITOT, PROT, ALBUMIN in the last 8760 hours.  Recent Labs   02/19/16 1514  02/21/16 0350 02/22/16 0739 02/25/16 0530  WBC 15.1*  < > 11.9* 9.1 8.6  NEUTROABS 13.9*  --   --   --   --   HGB 10.7*  < > 8.8* 8.4* 8.7*  HCT 34.5*  < > 29.3* 27.1* 28.1*  MCV 92.5  < > 92.1 91.6 90.6  PLT 289  < > 244 253 340  < > = values in this interval not displayed. Lab Results  Component Value Date   TSH 1.496 02/19/2016   Lab Results  Component Value Date   HGBA1C 7.0* 02/27/2016   No results found for: CHOL, HDL, LDLCALC, LDLDIRECT, TRIG, CHOLHDL  Significant Diagnostic Results in last 30 days:  Dg Chest 2 View  02/19/2016  CLINICAL DATA:  Pain following fall.  Hypertension. EXAM: CHEST  2 VIEW COMPARISON:  Chest radiograph April 09, 2011 and chest CT July 28, 2013 FINDINGS: There is no edema or consolidation. Heart is upper normal in size with pulmonary vascularity within normal limits. There is atherosclerotic calcification in the aorta. There is mitral annulus calcification. There is no demonstrable adenopathy. There is evidence of old trauma with remodeling involving the proximal left humerus. There old healed rib fractures on the right. IMPRESSION: No edema or consolidation.  There is aortic atherosclerosis. Electronically Signed   By: Lowella Grip III M.D.   On: 02/19/2016 15:55   Dg Pelvis 1-2 Views  02/19/2016  CLINICAL DATA:  Patient fell today from standing position. Pt c/o severe left leg pain, left generalized arm pain, and right toe pain. EXAM: PELVIS - 1-2 VIEW COMPARISON:  None. FINDINGS: No fracture.  No bone lesion.  Bones are demineralized. Hip joints, SI joints and symphysis pubis are normally aligned. IMPRESSION: No fracture or dislocation. Electronically Signed   By: Lajean Manes M.D.   On: 02/19/2016 14:40   Dg Forearm Left  02/19/2016  CLINICAL DATA:  Patient fell today from standing position. Pt c/o severe left leg pain, left generalized arm pain, and right toe pain. EXAM: LEFT FOREARM - 2 VIEW COMPARISON:  None. FINDINGS: No  fracture.  No dislocation.  Bones are demineralized. There is soft tissue swelling along the radial aspect of the distal forearm.  No radiopaque foreign body. IMPRESSION: No fracture or dislocation. Electronically Signed   By: Lajean Manes M.D.   On: 02/19/2016 14:37   Dg Tibia/fibula Left  02/19/2016  CLINICAL DATA:  Patient fell today from standing position. Pt c/o severe left leg pain, left generalized arm pain, and right toe pain. EXAM: LEFT TIBIA AND FIBULA - 2 VIEW COMPARISON:  None. FINDINGS: No fracture.  No bone lesion. There are advanced degenerative changes of the knee with marked medial compartment narrowing. Ankle joint is normally spaced and aligned. C/o Bones are demineralized. There are soft tissue and vascular calcifications along the left leg with multiple vascular clips from previous the umbilical vein surgery. Small IMPRESSION: 1. No acute fracture or dislocation. Electronically Signed   By: Lajean Manes M.D.   On: 02/19/2016 14:39   Ct Knee Left Wo Contrast  02/19/2016  CLINICAL DATA:  Fall from standing position. Severe pain to left knee, toes on left and right feet. EXAM: CT OF THE LEFT KNEE WITHOUT CONTRAST TECHNIQUE: Multidetector CT imaging of the LEFT knee was performed according to the standard protocol. Multiplanar CT image reconstructions were also generated. COMPARISON:  None. FINDINGS: There is a markedly displaced/comminuted fracture within the distal left femur, with significant impaction at the fracture site and anterior-lateral angulation at the fracture site. Fracture does not appear to extend to the articular surface of either femoral condyles. No fracture seen within the proximal tibia or fibula. Extensive degenerative change at the right knee, tricompartmental, with most severe joint space narrowing and osteophyte formation at the medial compartment with abutment of the medial femoral head aunt tibial plateau. Joint effusion is present, most prominent within the  suprapatellar bursa, moderate in size. Superficial soft tissues are unremarkable. IMPRESSION: 1. Markedly displaced/comminuted fracture of the distal left femur, centered at the distal metaphysis, with prominent impaction at the fracture site and anterior-lateral angulation at the fracture site. Fracture does not appear to extend to the articular surface of either femoral condyle. 2. 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 3. Joint effusion. Electronically Signed   By: Franki Cabot M.D.   On: 02/19/2016 16:35   Dg Humerus Left  02/19/2016  CLINICAL DATA:  Fall.  Severe left leg pain.  Left arm pain. EXAM: LEFT HUMERUS - 2+ VIEW COMPARISON:  12/24/2008 FINDINGS: Old healed fracture within the proximal left humerus. No acute fracture. No subluxation or dislocation. IMPRESSION: No acute bony abnormality. Electronically Signed   By: Rolm Baptise M.D.   On: 02/19/2016 14:31   Dg Foot Complete Left  02/19/2016  CLINICAL DATA:  Patient fell today from standing position. Pt c/o severe left leg pain, left generalized arm pain, and right toe pain. EXAM: LEFT FOOT - COMPLETE 3+ VIEW COMPARISON:  None. FINDINGS: There is a nondisplaced, non comminuted fracture across the medial base of the proximal phalanx of the great toe. No other acute fracture.  No dislocation. Bones are diffusely demineralized. IMPRESSION: Nondisplaced fracture of the medial base of the proximal phalanx of left great toe. Electronically Signed   By: Lajean Manes M.D.   On: 02/19/2016 14:34   Dg Foot Complete Right  02/19/2016  CLINICAL DATA:  Patient fell today from standing position. Pt c/o severe left leg pain, left generalized arm pain, and right toe pain. EXAM: RIGHT FOOT COMPLETE - 3+ VIEW COMPARISON:  09/30/2013 FINDINGS: There is nondisplaced fracture across the lateral base  of the proximal phalanx of the great toe. No other acute  fractures. Old healed fracture of the proximal first metatarsal. Joints are normally aligned. Bones are diffusely demineralized. IMPRESSION: 1. Nondisplaced fracture across the lateral base of the proximal phalanx of the right great toe. No other fractures. No dislocation. Electronically Signed   By: Lajean Manes M.D.   On: 02/19/2016 14:36   Dg C-arm 1-60 Min  02/20/2016  CLINICAL DATA:  Post left femur fracture repair EXAM: LEFT FEMUR 2 VIEWS; DG C-ARM 61-120 MIN COMPARISON:  02/19/2016 FINDINGS: The patient is status post open reduction internal fixation of distal left femoral fracture. There is intra medullary rod in left femur. At least three locking screws are noted in distal left femur. There is improvement with near anatomic alignment. IMPRESSION: Status post intraoperative repair of distal left femoral fracture. Intra medullary rod and locking screws are noted in left femur. There is improvement near anatomic alignment. Fluoroscopy time was 3 minutes 12 seconds. Please see the operative report. Electronically Signed   By: Lahoma Crocker M.D.   On: 02/20/2016 12:37   Dg Femur Min 2 Views Left  02/20/2016  CLINICAL DATA:  Post left femur fracture repair EXAM: LEFT FEMUR 2 VIEWS; DG C-ARM 61-120 MIN COMPARISON:  02/19/2016 FINDINGS: The patient is status post open reduction internal fixation of distal left femoral fracture. There is intra medullary rod in left femur. At least three locking screws are noted in distal left femur. There is improvement with near anatomic alignment. IMPRESSION: Status post intraoperative repair of distal left femoral fracture. Intra medullary rod and locking screws are noted in left femur. There is improvement near anatomic alignment. Fluoroscopy time was 3 minutes 12 seconds. Please see the operative report. Electronically Signed   By: Lahoma Crocker M.D.   On: 02/20/2016 12:37   Dg Femur Min 2 Views Left  02/19/2016  CLINICAL DATA:  Patient fell today from standing position. Pt  c/o severe left leg pain, left generalized arm pain, and right toe pain. EXAM: LEFT FEMUR 2 VIEWS COMPARISON:  None. FINDINGS: There is an oblique fracture of the distal femur. Fracture is mildly comminuted. Fracture is also mildly displaced with the distal primary fracture component telescoping on the shaft component by 2.7 cm. There is approximate 1 cm of lateral displacement. There is mild posterior angulation. No other fracture.  No dislocation. Bones are demineralized. IMPRESSION: 1. Fracture of the distal left femur as described.  No dislocation. Electronically Signed   By: Lajean Manes M.D.   On: 02/19/2016 14:33    Assessment/Plan  #1 foot discomfort mainly her right foot she describes this as a gripping cramping type pain that is intermittent will start Robaxin 500 mg every 6 hours when necessary and monitor she does receive Percocet as needed for pain as well with a history of left hip fracture with repair.  #2 patient states she may be vitamin D deficient will update a vitamin D level.  #3 history of chills-she says this is long-term I did review her labs in Epic and do see TSH earlier this month was unremarkable at this point will monitor.--We will update CBC and metabolic panel  #4 hypertension this appears under relatively decent control she is now on lisinopril as well as Norvasc occasionally systolics above 762 but I do not see persistence here will monitor  [place], [place], [place]

## 2016-03-05 ENCOUNTER — Encounter (HOSPITAL_COMMUNITY)
Admission: RE | Admit: 2016-03-05 | Discharge: 2016-03-05 | Disposition: A | Discharge status: Home / Self Care | Payer: Medicare Other | Source: Skilled Nursing Facility | Attending: Internal Medicine | Admitting: Internal Medicine

## 2016-03-05 LAB — CBC
HCT: 27.5 % — ABNORMAL LOW (ref 36.0–46.0)
HEMOGLOBIN: 8.5 g/dL — AB (ref 12.0–15.0)
MCH: 27.9 pg (ref 26.0–34.0)
MCHC: 30.9 g/dL (ref 30.0–36.0)
MCV: 90.2 fL (ref 78.0–100.0)
PLATELETS: 417 10*3/uL — AB (ref 150–400)
RBC: 3.05 MIL/uL — AB (ref 3.87–5.11)
RDW: 13.6 % (ref 11.5–15.5)
WBC: 8 10*3/uL (ref 4.0–10.5)

## 2016-03-06 LAB — VITAMIN D 25 HYDROXY (VIT D DEFICIENCY, FRACTURES): Vit D, 25-Hydroxy: 29.4 ng/mL — ABNORMAL LOW (ref 30.0–100.0)

## 2016-03-07 ENCOUNTER — Encounter (HOSPITAL_COMMUNITY)
Admission: RE | Admit: 2016-03-07 | Discharge: 2016-03-07 | Disposition: A | Discharge status: Home / Self Care | Payer: Medicare Other | Source: Skilled Nursing Facility | Attending: Internal Medicine | Admitting: Internal Medicine

## 2016-03-07 ENCOUNTER — Other Ambulatory Visit: Payer: Self-pay | Admitting: *Deleted

## 2016-03-07 LAB — BASIC METABOLIC PANEL
ANION GAP: 10 (ref 5–15)
BUN: 11 mg/dL (ref 6–20)
CO2: 28 mmol/L (ref 22–32)
Calcium: 9 mg/dL (ref 8.9–10.3)
Chloride: 99 mmol/L — ABNORMAL LOW (ref 101–111)
Creatinine, Ser: 0.78 mg/dL (ref 0.44–1.00)
GFR calc Af Amer: >60 mL/min (ref >60)
Glucose, Bld: 126 mg/dL — ABNORMAL HIGH (ref 65–99)
POTASSIUM: 3.6 mmol/L (ref 3.5–5.1)
SODIUM: 137 mmol/L (ref 135–145)

## 2016-03-07 LAB — CK: CK TOTAL: 23 U/L — AB (ref 38–234)

## 2016-03-07 LAB — MAGNESIUM: MAGNESIUM: 1.4 mg/dL — AB (ref 1.7–2.4)

## 2016-03-07 MED ORDER — OXYCODONE HCL 5 MG PO TABS: 5.0000 mg | ORAL_TABLET | ORAL | Status: DC | PRN

## 2016-03-07 NOTE — Telephone Encounter (Signed)
[name]

## 2016-03-11 ENCOUNTER — Non-Acute Institutional Stay (SKILLED_NURSING_FACILITY): Payer: BLUE CROSS/BLUE SHIELD | Admitting: Internal Medicine

## 2016-03-11 ENCOUNTER — Encounter: Payer: Self-pay | Admitting: Internal Medicine

## 2016-03-11 ENCOUNTER — Encounter (HOSPITAL_COMMUNITY)
Admission: RE | Admit: 2016-03-11 | Discharge: 2016-03-11 | Disposition: A | Discharge status: Home / Self Care | Payer: Medicare Other | Source: Skilled Nursing Facility | Attending: Internal Medicine | Admitting: Internal Medicine

## 2016-03-11 DIAGNOSIS — R194 Change in bowel habit: Secondary | ICD-10-CM

## 2016-03-11 DIAGNOSIS — R290 Tetany: Secondary | ICD-10-CM | POA: Diagnosis not present

## 2016-03-11 LAB — CBC WITH DIFFERENTIAL/PLATELET
BASOS ABS: 0 10*3/uL (ref 0.0–0.1)
Basophils Relative: 1 %
Eosinophils Absolute: 0.3 10*3/uL (ref 0.0–0.7)
Eosinophils Relative: 4 %
HEMATOCRIT: 31.3 % — AB (ref 36.0–46.0)
Hemoglobin: 9.8 g/dL — ABNORMAL LOW (ref 12.0–15.0)
LYMPHS PCT: 20 %
Lymphs Abs: 1.7 10*3/uL (ref 0.7–4.0)
MCH: 28.1 pg (ref 26.0–34.0)
MCHC: 31.3 g/dL (ref 30.0–36.0)
MCV: 89.7 fL (ref 78.0–100.0)
Monocytes Absolute: 0.6 10*3/uL (ref 0.1–1.0)
Monocytes Relative: 7 %
NEUTROS ABS: 5.6 10*3/uL (ref 1.7–7.7)
Neutrophils Relative %: 68 %
Platelets: 376 10*3/uL (ref 150–400)
RBC: 3.49 MIL/uL — AB (ref 3.87–5.11)
RDW: 13.6 % (ref 11.5–15.5)
WBC: 8.2 10*3/uL (ref 4.0–10.5)

## 2016-03-11 LAB — MAGNESIUM: Magnesium: 1.4 mg/dL — ABNORMAL LOW (ref 1.7–2.4)

## 2016-03-11 LAB — FERRITIN: Ferritin: 61 ng/mL (ref 11–307)

## 2016-03-11 NOTE — Progress Notes (Signed)
    Patient ID: [name], female   DOB: Nov 26, 1950, 65 y.o.   MRN: [id number]   This is a nursing facility follow up for specific acute issue.   Interim medical record and care since last [facility] visit was updated with review of diagnostic studies and change in clinical status since last visit were documented.  HPI: She states the involuntary spasms in her legs began 02/20/16 the day after surgery. Initially she felt as if the bed was moving then noted involuntary movements of the legs intermittently since. She denies significant symptoms in the hands. She was found to have a normal CK level and  magnesium of 1.4 and supplementation has been initiated. To date there's been no change in the magnesium level however.  She does describe weakness in the left upper extremity.  Review of systems: She has had constipation 3-4 days; but she states that today she began to have some watery diarrhea. She describes very vivid dreams. Last night she states she dreamed that a cat slept on her bed. She also feels she is hearing voices at times. These are not frightening phenomena to her. Constitutional: No fever,significant weight change  Eyes: No diplopia, vision change Cardiovascular: No chest pain, palpitations,paroxysmal nocturnal dyspnea, claudication, edema  Respiratory: No cough, sputum production,hemoptysis, DOE , significant snoring,apnea   Gastrointestinal: No heartburn,dysphagia,abdominal pain, nausea / vomiting,rectal bleeding, melena Dermatologic: No rash, pruritus, change in appearance of skin Neurologic: No dizziness,headache,syncope, seizures, numbness , tingling Endocrine: No change in hair/skin/ nails, excessive thirst, excessive hunger, excessive urination  Hematologic/lymphatic: No significant bruising, lymphadenopathy,abnormal bleeding  Pertinent or positive findings include: There is increased hirsutism of the chin. Extraocular motion was intact without  nystagmus. Hematoma over the right forearm is dressed. Valgus deformity of the legs is present. Posterior tibial pulses are decreased. Wound at the left knee is closed. No asterixis is present. 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 She exhibited intermittent involuntary motion of the lower extremities.  General appearance :adequately nourished; in no distress. Eyes: No conjunctival inflammation or scleral icterus is present. Oral exam:  Lips and gums are healthy appearing.There is no oropharyngeal erythema or exudate noted. Dental hygiene is good. Heart:  Normal rate and regular rhythm. S1 and S2 normal without gallop, murmur, click, rub or other extra sounds   Lungs:Chest clear to auscultation; no wheezes, rhonchi,rales ,or rubs present.No increased work of breathing.  Abdomen: bowel sounds normal, soft and non-tender without masses, organomegaly or hernias noted.  No guarding or rebound. No flank tenderness to percussion. Vascular : all pulses equal ; no bruits present. Skin:Warm & dry.  Intact without suspicious lesions or rashes ; no tenting or jaundice  Lymphatic: No lymphadenopathy is noted about the head, neck, axilla Neuro: Strength, tone decreased.UE DTRs normal.   #1 tetany intermittently in lower extremities  #2 hypomagnesemia #3 bowel changes  Please see new  orders

## 2016-03-11 NOTE — Progress Notes (Signed)
This encounter was created in error - please disregard.

## 2016-03-11 NOTE — Patient Instructions (Signed)
Start a probiotic , Florastor  every day for loose stool. This will replace the normal bacteria which  are necessary for formation of normal stool and processing of food.

## 2016-03-12 ENCOUNTER — Encounter: Payer: Self-pay | Admitting: Internal Medicine

## 2016-03-12 ENCOUNTER — Non-Acute Institutional Stay (SKILLED_NURSING_FACILITY): Payer: BLUE CROSS/BLUE SHIELD | Admitting: Internal Medicine

## 2016-03-12 DIAGNOSIS — R443 Hallucinations, unspecified: Secondary | ICD-10-CM

## 2016-03-12 DIAGNOSIS — E162 Hypoglycemia, unspecified: Secondary | ICD-10-CM

## 2016-03-12 DIAGNOSIS — K5900 Constipation, unspecified: Secondary | ICD-10-CM | POA: Diagnosis not present

## 2016-03-12 NOTE — Progress Notes (Signed)
Location:   [facility] Room Number: 126/P Place of Service:  SNF [phone]) Provider:  Leeanne Deed, MD  Patient Care Team: Hendricks Limes, MD as PCP - General (Internal Medicine)  Extended Emergency Contact Information Primary Emergency Contact: Schnepp,John R Address: [address]          [place], [place] [address] Johnnette Litter of [place] Phone: [phone] Mobile Phone: [phone] Relation: Spouse  Code Status:  DNR Goals of care: Advanced Directive information Advanced Directives 03/12/2016  Does patient have an advance directive? Yes  Type of Advance Directive Out of facility DNR (pink MOST or yellow form)  Does patient want to make changes to advanced directive? No - Patient declined  Copy of advanced directive(s) in chart? Yes  Would patient like information on creating an advanced directive? -  Pre-existing out of facility DNR order (yellow form or pink MOST form) -    Acute visit secondary to hypoglycemia--possible constipation   HPI:  Pt is a 65 y.o. female seen today for an acute visit for low blood sugars at times.  Patient on admission to the hospital was actually on 3 oral agents including glipizide Actos and Glucophage-Actos and Glucophage were discontinued she did have renal insufficiency issues.--She was started on Lantus  Initially which came here appears her blood sugars were running somewhat high her Lantus was increased from 10 units to 15 units every morning she was continued on glipizide 10 mg every morning.  Last several days she's had some low blood sugars over the weekend I note she had a morning sugar of 72 and she had a sugar of 66 on Monday, July 24 it was 114 this morning.  At noon recent blood sugars appear to run from the 70s to the mid 100s.  4 PM I do see a blood sugar of 53 back on July 22 baseline appears to be more in the mid 100s.  At at bedtime there actually was a blood sugar of 43 on Saturday  night July 22 was 50 aunts July 23 and 78 on July 24.  She says at times she does feel bit jittery and thinks this may be due to the lower blood sugars.  She was seen yesterday by Dr. Linna Darner for suspected tetany-and also complaints of diarrhea she has been started on a probiotic and in her Slow-Mag was recently increased yesterday because of the tetany.  However today nursing feels that she's been having constipation-feels possibly diarrhea yesterday was more residual of that was able to make it around the constipation apparently she had a fairly good bowel movement of solid stool today     Past Medical History:  Diagnosis Date  . Depression   . Diabetes mellitus without complication ([place])   . Hypertension   . Osteoporosis   . UTI (lower urinary tract infection) 01/2016   Cipro for Klebsiella pneumoniae isolate   Past Surgical History:  Procedure Laterality Date  . ABDOMINAL HYSTERECTOMY    . CESAREAN SECTION    . CHOLECYSTECTOMY    . FEMUR IM NAIL Left 02/20/2016  . FEMUR IM NAIL Left 02/20/2016   Procedure: INTRAMEDULLARY (IM) RETROGRADE FEMORAL NAILING;  Surgeon: Leandrew Koyanagi, MD;  Location: [facility];  Service: Orthopedics;  Laterality: Left;  . PANCREAS SURGERY  1967   1 cyst excised and one cyst drained  . VEIN LIGATION AND STRIPPING    . WRIST FRACTURE SURGERY      No Known  Allergies  Current Outpatient Prescriptions on File Prior to Visit  Medication Sig Dispense Refill  . acetaminophen (TYLENOL) 325 MG tablet Take 650 mg by mouth every 6 (six) hours as needed for mild pain.    Marland Kitchen amLODipine (NORVASC) 5 MG tablet Take 1 tablet (5 mg total) by mouth daily. 30 tablet 0  . buPROPion (WELLBUTRIN XL) 300 MG 24 hr tablet Take 300 mg by mouth daily.    . calcium-vitamin D (OSCAL WITH D) 500-200 MG-UNIT tablet Take 1 tablet by mouth 2 (two) times daily. 30 tablet 0  . clonazePAM (KLONOPIN) 1 MG tablet Take 1 tablet (1 mg total) by mouth at bedtime. 30 tablet 5  . enoxaparin  (LOVENOX) 40 MG/0.4ML injection Inject 0.4 mLs (40 mg total) into the skin daily. 14 Syringe 0  . ferrous sulfate (KP FERROUS SULFATE) 325 (65 FE) MG tablet Take 325 mg by mouth 2 (two) times daily with a meal.     . FLUoxetine (PROZAC) 40 MG capsule Take 40 mg by mouth daily.    Marland Kitchen glipiZIDE (GLUCOTROL) 10 MG tablet Take 10 mg by mouth 2 (two) times daily.    . insulin glargine (LANTUS) 100 UNIT/ML injection Inject 0.15 mLs (15 Units total) into the skin daily. 10 mL 11  . lisinopril (PRINIVIL,ZESTRIL) 20 MG tablet Take 20 mg by mouth daily.    . magnesium chloride (SLOW-MAG) 64 MG TBEC SR tablet Take 2 tablets by mouth at bedtime.    . methocarbamol (ROBAXIN) 500 MG tablet Take 500 mg by mouth every 6 (six) hours as needed for muscle spasms.    . promethazine (PHENERGAN) 25 MG tablet Take 25 mg by mouth every 6 (six) hours as needed for nausea or vomiting.    . Vitamin D, Ergocalciferol, (DRISDOL) 50000 units CAPS capsule Take 50,000 Units by mouth every 7 (seven) days.     No current facility-administered medications on file prior to visit.      Review of Systems   General physical complaints of fever or chills.  Skin does not currently complain of rashes or itching has frail skin chronic bruising or vomiting up her extremities.  Head ears eyes nose mouth and throat does not complaining of visual changes or sore throat.  Respiration not complaining of shortness breath or cough.  Cardiac no chest pain.  Musculoskeletal is status post left hip repair-at this point pain appears to be relatively well controlled.  GI is not complaining of nausea vomiting diarrhea--seen for diarrhea yesterday* however nursing staff has noted constipation today  Neurologic is not complaining of dizziness or headache again does have some tremors assess previously by Dr. Linna Darner.  Psych does state at times she has dreams question hallucinations where she sees animals in her room or hears voices from people  that are not in the room.      Immunization History  Administered Date(s) Administered  . PPD Test 02/22/2016   Pertinent  Health Maintenance Due  Topic Date Due  . FOOT EXAM  03/04/2017 (Originally 12/19/1960)  . OPHTHALMOLOGY EXAM  03/04/2017 (Originally 12/19/1960)  . URINE MICROALBUMIN  03/04/2017 (Originally 12/19/1960)  . DEXA SCAN  03/04/2017 (Originally 12/20/2015)  . PNA vac Low Risk Adult (1 of 2 - PCV13) 03/04/2017 (Originally 12/20/2015)  . PAP SMEAR  03/05/2019 (Originally 12/20/1971)  . COLONOSCOPY  03/04/2026 (Originally 12/19/2000)  . INFLUENZA VACCINE  03/18/2016  . HEMOGLOBIN A1C  08/29/2016  . MAMMOGRAM  09/07/2016   No flowsheet data found. Functional Status Survey:  Physical Exam   Temperature 98.3 pulse 90 respirations 20 blood pressure 169/58-130/58 has been recently in this range   In general this is a pleasant elderly female in no distress sitting comfortably in her chair.  Skin is warm and dry she does have fragile skin upper extremities with a history of coma left forearm.  Chest is clear to auscultation there is no labored breathing.  Heart is regular rate and rhythm without murmur gallop or rub.  Abdomen soft obese soft nontender positive bowel sounds.  Muscle skeletal is able to move her extremities 4 does have valgus deformity of her legs bilaterally.  Surgical site left lower extremity does not appear to be infected this is followed by nursing.  Neurologic does have intentional tremor of her upper extremities hands bilaterally.  Cranial nerves are grossly intact her speech is clear.  Psych she is alert and oriented pleasant and appropriate    Labs reviewed:  Recent Labs  02/22/16 0739 02/25/16 0530 03/07/16 0745 03/11/16 0725  NA 135 137 137  --   K 4.6 3.9 3.6  --   CL 106 103 99*  --   CO2 23 28 28   --   GLUCOSE 169* 166* 126*  --   BUN 34* 21* 11  --   CREATININE 1.16* 0.86 0.78  --   CALCIUM 9.0 8.8* 9.0  --   MG  --    --  1.4* 1.4*   No results for input(s): AST, ALT, ALKPHOS, BILITOT, PROT, ALBUMIN in the last 8760 hours.  Recent Labs  02/19/16 1514  02/25/16 0530 03/05/16 0700 03/11/16 0500  WBC 15.1*  < > 8.6 8.0 8.2  NEUTROABS 13.9*  --   --   --  5.6  HGB 10.7*  < > 8.7* 8.5* 9.8*  HCT 34.5*  < > 28.1* 27.5* 31.3*  MCV 92.5  < > 90.6 90.2 89.7  PLT 289  < > 340 417* 376  < > = values in this interval not displayed. Lab Results  Component Value Date   TSH 1.496 02/19/2016   Lab Results  Component Value Date   HGBA1C 7.0 (H) 02/27/2016   No results found for: CHOL, HDL, LDLCALC, LDLDIRECT, TRIG, CHOLHDL  Significant Diagnostic Results in last 30 days:  Dg Chest 2 View  Result Date: 02/19/2016 CLINICAL DATA:  Pain following fall.  Hypertension. EXAM: CHEST  2 VIEW COMPARISON:  Chest radiograph April 09, 2011 and chest CT July 28, 2013 FINDINGS: There is no edema or consolidation. Heart is upper normal in size with pulmonary vascularity within normal limits. There is atherosclerotic calcification in the aorta. There is mitral annulus calcification. There is no demonstrable adenopathy. There is evidence of old trauma with remodeling involving the proximal left humerus. There old healed rib fractures on the right. IMPRESSION: No edema or consolidation.  There is aortic atherosclerosis. Electronically Signed   By: Lowella Grip III M.D.   On: 02/19/2016 15:55   Dg Pelvis 1-2 Views  Result Date: 02/19/2016 CLINICAL DATA:  Patient fell today from standing position. Pt c/o severe left leg pain, left generalized arm pain, and right toe pain. EXAM: PELVIS - 1-2 VIEW COMPARISON:  None. FINDINGS: No fracture.  No bone lesion.  Bones are demineralized. Hip joints, SI joints and symphysis pubis are normally aligned. IMPRESSION: No fracture or dislocation. Electronically Signed   By: Lajean Manes M.D.   On: 02/19/2016 14:40   Dg Forearm Left  Result Date: 02/19/2016 CLINICAL DATA:  Patient fell  today from standing position. Pt c/o severe left leg pain, left generalized arm pain, and right toe pain. EXAM: LEFT FOREARM - 2 VIEW COMPARISON:  None. FINDINGS: No fracture.  No dislocation.  Bones are demineralized. There is soft tissue swelling along the radial aspect of the distal forearm. No radiopaque foreign body. IMPRESSION: No fracture or dislocation. Electronically Signed   By: Lajean Manes M.D.   On: 02/19/2016 14:37   Dg Tibia/fibula Left  Result Date: 02/19/2016 CLINICAL DATA:  Patient fell today from standing position. Pt c/o severe left leg pain, left generalized arm pain, and right toe pain. EXAM: LEFT TIBIA AND FIBULA - 2 VIEW COMPARISON:  None. FINDINGS: No fracture.  No bone lesion. There are advanced degenerative changes of the knee with marked medial compartment narrowing. Ankle joint is normally spaced and aligned. C/o Bones are demineralized. There are soft tissue and vascular calcifications along the left leg with multiple vascular clips from previous the umbilical vein surgery. Small IMPRESSION: 1. No acute fracture or dislocation. Electronically Signed   By: Lajean Manes M.D.   On: 02/19/2016 14:39   Ct Knee Left Wo Contrast  Result Date: 02/19/2016 CLINICAL DATA:  Fall from standing position. Severe pain to left knee, toes on left and right feet. EXAM: CT OF THE LEFT KNEE WITHOUT CONTRAST TECHNIQUE: Multidetector CT imaging of the LEFT knee was performed according to the standard protocol. Multiplanar CT image reconstructions were also generated. COMPARISON:  None. FINDINGS: There is a markedly displaced/comminuted fracture within the distal left femur, with significant impaction at the fracture site and anterior-lateral angulation at the fracture site. Fracture does not appear to extend to the articular surface of either femoral condyles. No fracture seen within the proximal tibia or fibula. Extensive degenerative change at the right knee, tricompartmental, with most severe joint  space narrowing and osteophyte formation at the medial compartment with abutment of the medial femoral head aunt tibial plateau. Joint effusion is present, most prominent within the suprapatellar bursa, moderate in size. Superficial soft tissues are unremarkable. IMPRESSION: 1. Markedly displaced/comminuted fracture of the distal left femur, centered at the distal metaphysis, with prominent impaction at the fracture site and anterior-lateral angulation at the fracture site. Fracture does not appear to extend to the articular surface of either femoral condyle. 2. 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 3. Joint effusion. Electronically Signed   By: Franki Cabot M.D.   On: 02/19/2016 16:35   Dg Humerus Left  Result Date: 02/19/2016 CLINICAL DATA:  Fall.  Severe left leg pain.  Left arm pain. EXAM: LEFT HUMERUS - 2+ VIEW COMPARISON:  12/24/2008 FINDINGS: Old healed fracture within the proximal left humerus. No acute fracture. No subluxation or dislocation. IMPRESSION: No acute bony abnormality. Electronically Signed   By: Rolm Baptise M.D.   On: 02/19/2016 14:31   Dg Foot Complete Left  Result Date: 02/19/2016 CLINICAL DATA:  Patient fell today from standing position. Pt c/o severe left leg pain, left generalized arm pain, and right toe pain. EXAM: LEFT FOOT - COMPLETE 3+ VIEW COMPARISON:  None. FINDINGS: There is a nondisplaced, non comminuted fracture across the medial base of the proximal phalanx of the great toe. No other acute fracture.  No dislocation. Bones are diffusely demineralized. IMPRESSION: Nondisplaced fracture of the medial base of the proximal phalanx of left great toe. Electronically Signed   By: Lajean Manes  M.D.   On: 02/19/2016 14:34   Dg Foot Complete Right  Result Date: 02/19/2016 CLINICAL DATA:  Patient fell today from standing position. Pt c/o severe left leg pain, left  generalized arm pain, and right toe pain. EXAM: RIGHT FOOT COMPLETE - 3+ VIEW COMPARISON:  09/30/2013 FINDINGS: There is nondisplaced fracture across the lateral base of the proximal phalanx of the great toe. No other acute fractures. Old healed fracture of the proximal first metatarsal. Joints are normally aligned. Bones are diffusely demineralized. IMPRESSION: 1. Nondisplaced fracture across the lateral base of the proximal phalanx of the right great toe. No other fractures. No dislocation. Electronically Signed   By: Lajean Manes M.D.   On: 02/19/2016 14:36   Dg C-arm 1-60 Min  Result Date: 02/20/2016 CLINICAL DATA:  Post left femur fracture repair EXAM: LEFT FEMUR 2 VIEWS; DG C-ARM 61-120 MIN COMPARISON:  02/19/2016 FINDINGS: The patient is status post open reduction internal fixation of distal left femoral fracture. There is intra medullary rod in left femur. At least three locking screws are noted in distal left femur. There is improvement with near anatomic alignment. IMPRESSION: Status post intraoperative repair of distal left femoral fracture. Intra medullary rod and locking screws are noted in left femur. There is improvement near anatomic alignment. Fluoroscopy time was 3 minutes 12 seconds. Please see the operative report. Electronically Signed   By: Lahoma Crocker M.D.   On: 02/20/2016 12:37   Dg Femur Min 2 Views Left  Result Date: 02/20/2016 CLINICAL DATA:  Post left femur fracture repair EXAM: LEFT FEMUR 2 VIEWS; DG C-ARM 61-120 MIN COMPARISON:  02/19/2016 FINDINGS: The patient is status post open reduction internal fixation of distal left femoral fracture. There is intra medullary rod in left femur. At least three locking screws are noted in distal left femur. There is improvement with near anatomic alignment. IMPRESSION: Status post intraoperative repair of distal left femoral fracture. Intra medullary rod and locking screws are noted in left femur. There is improvement near anatomic alignment.  Fluoroscopy time was 3 minutes 12 seconds. Please see the operative report. Electronically Signed   By: Lahoma Crocker M.D.   On: 02/20/2016 12:37   Dg Femur Min 2 Views Left  Result Date: 02/19/2016 CLINICAL DATA:  Patient fell today from standing position. Pt c/o severe left leg pain, left generalized arm pain, and right toe pain. EXAM: LEFT FEMUR 2 VIEWS COMPARISON:  None. FINDINGS: There is an oblique fracture of the distal femur. Fracture is mildly comminuted. Fracture is also mildly displaced with the distal primary fracture component telescoping on the shaft component by 2.7 cm. There is approximate 1 cm of lateral displacement. There is mild posterior angulation. No other fracture.  No dislocation. Bones are demineralized. IMPRESSION: 1. Fracture of the distal left femur as described.  No dislocation. Electronically Signed   By: Lajean Manes M.D.   On: 02/19/2016 14:33    Assessment/Plan  1 hypoglycemia-noted above does have somewhat fairly consistent lower blood sugars-will discontinue the glipizide and encourage  at bedtime snacks strongly also check CBG at midnight and 3 AM tonight to ensure stability-and continue to monitor blood sugars for any possible further adjustments   #2 suspect constipation will add Senokot-S at night and monitor.   #3 hallucinations? Will obtain a psychiatric consult  [place], [place] [phone]

## 2016-03-16 ENCOUNTER — Encounter (HOSPITAL_COMMUNITY)
Admission: RE | Admit: 2016-03-16 | Discharge: 2016-03-16 | Disposition: A | Discharge status: Home / Self Care | Payer: Medicare Other | Source: Skilled Nursing Facility | Attending: Internal Medicine | Admitting: Internal Medicine

## 2016-03-16 LAB — BASIC METABOLIC PANEL
Anion gap: 4 — ABNORMAL LOW (ref 5–15)
BUN: 20 mg/dL (ref 6–20)
CHLORIDE: 108 mmol/L (ref 101–111)
CO2: 26 mmol/L (ref 22–32)
Calcium: 8.8 mg/dL — ABNORMAL LOW (ref 8.9–10.3)
Creatinine, Ser: 0.9 mg/dL (ref 0.44–1.00)
GFR calc Af Amer: >60 mL/min (ref >60)
GFR calc non Af Amer: >60 mL/min (ref >60)
GLUCOSE: 123 mg/dL — AB (ref 65–99)
POTASSIUM: 3.7 mmol/L (ref 3.5–5.1)
Sodium: 138 mmol/L (ref 135–145)

## 2016-03-16 LAB — MAGNESIUM: Magnesium: 1.7 mg/dL (ref 1.7–2.4)

## 2016-03-18 DIAGNOSIS — Z9181 History of falling: Secondary | ICD-10-CM | POA: Diagnosis not present

## 2016-03-18 DIAGNOSIS — M6281 Muscle weakness (generalized): Secondary | ICD-10-CM | POA: Diagnosis not present

## 2016-03-18 DIAGNOSIS — N390 Urinary tract infection, site not specified: Secondary | ICD-10-CM | POA: Diagnosis not present

## 2016-03-18 DIAGNOSIS — N3000 Acute cystitis without hematuria: Secondary | ICD-10-CM | POA: Diagnosis not present

## 2016-03-18 DIAGNOSIS — E162 Hypoglycemia, unspecified: Secondary | ICD-10-CM | POA: Diagnosis not present

## 2016-03-18 DIAGNOSIS — S42412D Displaced simple supracondylar fracture without intercondylar fracture of left humerus, subsequent encounter for fracture with routine healing: Secondary | ICD-10-CM | POA: Diagnosis not present

## 2016-03-18 DIAGNOSIS — B961 Klebsiella pneumoniae [K. pneumoniae] as the cause of diseases classified elsewhere: Secondary | ICD-10-CM | POA: Diagnosis not present

## 2016-03-18 DIAGNOSIS — S92404D Nondisplaced unspecified fracture of right great toe, subsequent encounter for fracture with routine healing: Secondary | ICD-10-CM | POA: Diagnosis not present

## 2016-03-18 DIAGNOSIS — Z4789 Encounter for other orthopedic aftercare: Secondary | ICD-10-CM | POA: Diagnosis not present

## 2016-03-18 DIAGNOSIS — R279 Unspecified lack of coordination: Secondary | ICD-10-CM | POA: Diagnosis not present

## 2016-03-18 DIAGNOSIS — E119 Type 2 diabetes mellitus without complications: Secondary | ICD-10-CM | POA: Diagnosis not present

## 2016-03-18 DIAGNOSIS — E118 Type 2 diabetes mellitus with unspecified complications: Secondary | ICD-10-CM | POA: Diagnosis not present

## 2016-03-18 DIAGNOSIS — S7292XA Unspecified fracture of left femur, initial encounter for closed fracture: Secondary | ICD-10-CM | POA: Diagnosis not present

## 2016-03-18 DIAGNOSIS — M810 Age-related osteoporosis without current pathological fracture: Secondary | ICD-10-CM | POA: Diagnosis not present

## 2016-03-18 DIAGNOSIS — S72402S Unspecified fracture of lower end of left femur, sequela: Secondary | ICD-10-CM | POA: Diagnosis not present

## 2016-03-18 DIAGNOSIS — D62 Acute posthemorrhagic anemia: Secondary | ICD-10-CM | POA: Diagnosis not present

## 2016-03-18 DIAGNOSIS — S92405D Nondisplaced unspecified fracture of left great toe, subsequent encounter for fracture with routine healing: Secondary | ICD-10-CM | POA: Diagnosis not present

## 2016-03-18 DIAGNOSIS — I10 Essential (primary) hypertension: Secondary | ICD-10-CM | POA: Diagnosis not present

## 2016-03-18 DIAGNOSIS — M6249 Contracture of muscle, multiple sites: Secondary | ICD-10-CM | POA: Diagnosis not present

## 2016-03-18 DIAGNOSIS — F329 Major depressive disorder, single episode, unspecified: Secondary | ICD-10-CM | POA: Diagnosis not present

## 2016-03-18 DIAGNOSIS — R293 Abnormal posture: Secondary | ICD-10-CM | POA: Diagnosis not present

## 2016-03-19 ENCOUNTER — Non-Acute Institutional Stay (SKILLED_NURSING_FACILITY): Payer: BLUE CROSS/BLUE SHIELD | Admitting: Internal Medicine

## 2016-03-19 ENCOUNTER — Encounter (HOSPITAL_COMMUNITY)
Admission: RE | Admit: 2016-03-19 | Discharge: 2016-03-19 | Disposition: A | Discharge status: Home / Self Care | Payer: Medicare Other | Source: Skilled Nursing Facility | Attending: Internal Medicine | Admitting: Internal Medicine

## 2016-03-19 ENCOUNTER — Encounter: Payer: Self-pay | Admitting: Internal Medicine

## 2016-03-19 DIAGNOSIS — F329 Major depressive disorder, single episode, unspecified: Secondary | ICD-10-CM | POA: Insufficient documentation

## 2016-03-19 DIAGNOSIS — N3000 Acute cystitis without hematuria: Secondary | ICD-10-CM | POA: Diagnosis not present

## 2016-03-19 DIAGNOSIS — M6249 Contracture of muscle, multiple sites: Secondary | ICD-10-CM

## 2016-03-19 DIAGNOSIS — E162 Hypoglycemia, unspecified: Secondary | ICD-10-CM

## 2016-03-19 DIAGNOSIS — I10 Essential (primary) hypertension: Secondary | ICD-10-CM | POA: Diagnosis not present

## 2016-03-19 DIAGNOSIS — M62838 Other muscle spasm: Secondary | ICD-10-CM

## 2016-03-19 DIAGNOSIS — Z4789 Encounter for other orthopedic aftercare: Secondary | ICD-10-CM | POA: Insufficient documentation

## 2016-03-19 LAB — URINALYSIS, ROUTINE W REFLEX MICROSCOPIC
Glucose, UA: NEGATIVE mg/dL
Ketones, ur: 15 mg/dL — AB
NITRITE: POSITIVE — AB
PROTEIN: 100 mg/dL — AB
Specific Gravity, Urine: 1.01 (ref 1.005–1.030)
pH: 8.5 — ABNORMAL HIGH (ref 5.0–8.0)

## 2016-03-19 LAB — URINE MICROSCOPIC-ADD ON

## 2016-03-19 NOTE — Progress Notes (Signed)
Location:   [facility] Room Number: 126/P Place of Service:  SNF [phone]) Provider:  Leeanne Deed, MD  Patient Care Team: Hendricks Limes, MD as PCP - General (Internal Medicine)  Extended Emergency Contact Information Primary Emergency Contact: Brackney,John R Address: [address]          [place], [place] [address] Johnnette Litter of [place] Phone: [phone] Mobile Phone: [phone] Relation: Spouse  Code Status:  DNR Goals of care: Advanced Directive information Advanced Directives 03/19/2016  Does patient have an advance directive? Yes  Type of Advance Directive Out of facility DNR (pink MOST or yellow form)  Does patient want to make changes to advanced directive? No - Patient declined  Copy of advanced directive(s) in chart? Yes  Would patient like information on creating an advanced directive? -  Pre-existing out of facility DNR order (yellow form or pink MOST form) -     Chief Complaint  Patient presents with  . Acute Visit    Possible UTI   Follow-up diabetes HPI:  Pt is a 65 y.o. female seen today for an acute visit forComplaints of dysuria and back pain-she states usually has this when she has a UTI.  She denies any fever or chills.  Of note her glipizide was discontinued recently because of some lower blood sugars especially the morning this appears to have improved recent blood sugars in the morning. Arrange more in the mid 100s this appears to be case also at noon as well as at 4 PM and at at bedtime.  Again her main complaint today is dysuria back pain  GI at times does have the leg spasms but this has improved-Dr. Linna Darner did start her on Slow-Mag which appears to be helping   Past Medical History:  Diagnosis Date  . Depression   . Diabetes mellitus without complication ([facility])   . Hypertension   . Osteoporosis   . UTI (lower urinary tract infection) 01/2016   Cipro for Klebsiella pneumoniae isolate   Past  Surgical History:  Procedure Laterality Date  . ABDOMINAL HYSTERECTOMY    . CESAREAN SECTION    . CHOLECYSTECTOMY    . FEMUR IM NAIL Left 02/20/2016  . FEMUR IM NAIL Left 02/20/2016   Procedure: INTRAMEDULLARY (IM) RETROGRADE FEMORAL NAILING;  Surgeon: Leandrew Koyanagi, MD;  Location: [facility];  Service: Orthopedics;  Laterality: Left;  . PANCREAS SURGERY  1967   1 cyst excised and one cyst drained  . VEIN LIGATION AND STRIPPING    . WRIST FRACTURE SURGERY      No Known Allergies  Current Outpatient Prescriptions on File Prior to Visit  Medication Sig Dispense Refill  . acetaminophen (TYLENOL) 325 MG tablet Take 650 mg by mouth every 6 (six) hours as needed for mild pain.    Marland Kitchen amLODipine (NORVASC) 5 MG tablet Take 1 tablet (5 mg total) by mouth daily. 30 tablet 0  . buPROPion (WELLBUTRIN XL) 300 MG 24 hr tablet Take 300 mg by mouth daily.    . calcium-vitamin D (OSCAL WITH D) 500-200 MG-UNIT tablet Take 1 tablet by mouth 2 (two) times daily. 30 tablet 0  . clonazePAM (KLONOPIN) 1 MG tablet Take 1 tablet (1 mg total) by mouth at bedtime. 30 tablet 5  . enoxaparin (LOVENOX) 40 MG/0.4ML injection Inject 0.4 mLs (40 mg total) into the skin daily. 14 Syringe 0  . ferrous sulfate (KP FERROUS SULFATE) 325 (65 FE) MG tablet Take 325 mg  by mouth 2 (two) times daily with a meal.     . FLUoxetine (PROZAC) 40 MG capsule Take 40 mg by mouth daily.    . insulin glargine (LANTUS) 100 UNIT/ML injection Inject 0.15 mLs (15 Units total) into the skin daily. 10 mL 11  . lisinopril (PRINIVIL,ZESTRIL) 20 MG tablet Take 20 mg by mouth daily.    . magnesium chloride (SLOW-MAG) 64 MG TBEC SR tablet Take 2 tablets by mouth at bedtime.    . methocarbamol (ROBAXIN) 500 MG tablet Take 500 mg by mouth every 6 (six) hours as needed for muscle spasms.    Marland Kitchen oxyCODONE (OXY IR/ROXICODONE) 5 MG immediate release tablet Take 1 tablet by mouth as needed for Mild pain every four hours. Take 2 tablets by mouth as needed for Moderate  pain every four hours. Take 3 tablets by mouth for severe pain as needed every four hours    . promethazine (PHENERGAN) 25 MG tablet Take 25 mg by mouth every 6 (six) hours as needed for nausea or vomiting.    . saccharomyces boulardii (FLORASTOR) 250 MG capsule Take 250 mg by mouth daily.    . Vitamin D, Ergocalciferol, (DRISDOL) 50000 units CAPS capsule Take 50,000 Units by mouth every 7 (seven) days.     No current facility-administered medications on file prior to visit.     Review of Systems    General no complaints of fever or chills today  Skin does not complain of rashes or itching.  Head ears eyes nose mouth throat no complaints of sore throat or visual changes.  Respiratory is not complaining shortness breath or cough.  Cardiac no chest pain.  GI is not complaining of abdominal discomfort nausea vomiting diarrhea constipation   GU is complaining of some dysuria burning-and it appears some suprapubic discomfort  Musculoskeletal  Says this spasms in her right lower extremity are improved although occasionally she still will have some although these are not as painful as previously Does have some back discomfort she attributes to UTI t.  Neurologic is not complaining of dizziness or headache does have some history of numbness lower extremities.  And psych is not complaining currently of anxiety or depression but does have some history of depression  Immunization History  Administered Date(s) Administered  . PPD Test 02/22/2016   Pertinent  Health Maintenance Due  Topic Date Due  . INFLUENZA VACCINE  03/18/2016  . FOOT EXAM  03/04/2017 (Originally 12/19/1960)  . OPHTHALMOLOGY EXAM  03/04/2017 (Originally 12/19/1960)  . URINE MICROALBUMIN  03/04/2017 (Originally 12/19/1960)  . DEXA SCAN  03/04/2017 (Originally 12/20/2015)  . PNA vac Low Risk Adult (1 of 2 - PCV13) 03/04/2017 (Originally 12/20/2015)  . PAP SMEAR  03/05/2019 (Originally 12/20/1971)  . COLONOSCOPY   03/04/2026 (Originally 12/19/2000)  . HEMOGLOBIN A1C  08/29/2016  . MAMMOGRAM  09/07/2016   No flowsheet data found. Functional Status Survey:    Vitals:   03/19/16 1252  BP: (!) 145/50  Pulse: 87  Resp: 19  Temp: 98.7 F (37.1 C)  TempSrc: Oral  SpO2: 98%   There is no height or weight on file to calculate BMI. Physical Exam   This is a pleasant female in no distress sitting comfortably in wheelchair.  Her skin is warm and dry s   Eyes pupils appear reactive light sclera and icteric clear visual acuity appears grossly intact.  Oropharynx clear mucous membranes moist.  Chest is clear to auscultation there is no labored breathing.  Heart is regular  rate and rhythm without murmur gallop or rub she does not really have significant lower extremity edema pedal pulses are intact bilaterally.  Abdomen soft obese soft nontender positive bowel sounds.  GU I do not note any drainage or discharge from vaginal area she appears to have some mild suprapubic tenderness this is more so on the left side I do not see any distention  Musculoskeletal limited exam since patient is in bed but does move all her extremities 4 I do not note any deformities right foot has no edema or erythema baseline exam.  Back-- appears to have some mild  CV tenderness        Neurologic--cranial nerves are grossly intact her speech is clear strength appears relatively intact all 4 extremities.  Psych she is alert and oriented pleasant and appropriate     Labs reviewed:  Recent Labs  02/25/16 0530 03/07/16 0745 03/11/16 0725 03/16/16 0415  NA 137 137  --  138  K 3.9 3.6  --  3.7  CL 103 99*  --  108  CO2 28 28  --  26  GLUCOSE 166* 126*  --  123*  BUN 21* 11  --  20  CREATININE 0.86 0.78  --  0.90  CALCIUM 8.8* 9.0  --  8.8*  MG  --  1.4* 1.4* 1.7   No results for input(s): AST, ALT, ALKPHOS, BILITOT, PROT, ALBUMIN in the last 8760 hours.  Recent Labs  02/19/16 1514   02/25/16 0530 03/05/16 0700 03/11/16 0500  WBC 15.1*  < > 8.6 8.0 8.2  NEUTROABS 13.9*  --   --   --  5.6  HGB 10.7*  < > 8.7* 8.5* 9.8*  HCT 34.5*  < > 28.1* 27.5* 31.3*  MCV 92.5  < > 90.6 90.2 89.7  PLT 289  < > 340 417* 376  < > = values in this interval not displayed. Lab Results  Component Value Date   TSH 1.496 02/19/2016   Lab Results  Component Value Date   HGBA1C 7.0 (H) 02/27/2016   No results found for: CHOL, HDL, LDLCALC, LDLDIRECT, TRIG, CHOLHDL  Significant Diagnostic Results in last 30 days:  Dg Chest 2 View  Result Date: 02/19/2016 CLINICAL DATA:  Pain following fall.  Hypertension. EXAM: CHEST  2 VIEW COMPARISON:  Chest radiograph April 09, 2011 and chest CT July 28, 2013 FINDINGS: There is no edema or consolidation. Heart is upper normal in size with pulmonary vascularity within normal limits. There is atherosclerotic calcification in the aorta. There is mitral annulus calcification. There is no demonstrable adenopathy. There is evidence of old trauma with remodeling involving the proximal left humerus. There old healed rib fractures on the right. IMPRESSION: No edema or consolidation.  There is aortic atherosclerosis. Electronically Signed   By: Lowella Grip III M.D.   On: 02/19/2016 15:55   Dg Pelvis 1-2 Views  Result Date: 02/19/2016 CLINICAL DATA:  Patient fell today from standing position. Pt c/o severe left leg pain, left generalized arm pain, and right toe pain. EXAM: PELVIS - 1-2 VIEW COMPARISON:  None. FINDINGS: No fracture.  No bone lesion.  Bones are demineralized. Hip joints, SI joints and symphysis pubis are normally aligned. IMPRESSION: No fracture or dislocation. Electronically Signed   By: Lajean Manes M.D.   On: 02/19/2016 14:40   Dg Forearm Left  Result Date: 02/19/2016 CLINICAL DATA:  Patient fell today from standing position. Pt c/o severe left leg pain, left generalized arm pain,  and right toe pain. EXAM: LEFT FOREARM - 2 VIEW  COMPARISON:  None. FINDINGS: No fracture.  No dislocation.  Bones are demineralized. There is soft tissue swelling along the radial aspect of the distal forearm. No radiopaque foreign body. IMPRESSION: No fracture or dislocation. Electronically Signed   By: Lajean Manes M.D.   On: 02/19/2016 14:37   Dg Tibia/fibula Left  Result Date: 02/19/2016 CLINICAL DATA:  Patient fell today from standing position. Pt c/o severe left leg pain, left generalized arm pain, and right toe pain. EXAM: LEFT TIBIA AND FIBULA - 2 VIEW COMPARISON:  None. FINDINGS: No fracture.  No bone lesion. There are advanced degenerative changes of the knee with marked medial compartment narrowing. Ankle joint is normally spaced and aligned. C/o Bones are demineralized. There are soft tissue and vascular calcifications along the left leg with multiple vascular clips from previous the umbilical vein surgery. Small IMPRESSION: 1. No acute fracture or dislocation. Electronically Signed   By: Lajean Manes M.D.   On: 02/19/2016 14:39   Ct Knee Left Wo Contrast  Result Date: 02/19/2016 CLINICAL DATA:  Fall from standing position. Severe pain to left knee, toes on left and right feet. EXAM: CT OF THE LEFT KNEE WITHOUT CONTRAST TECHNIQUE: Multidetector CT imaging of the LEFT knee was performed according to the standard protocol. Multiplanar CT image reconstructions were also generated. COMPARISON:  None. FINDINGS: There is a markedly displaced/comminuted fracture within the distal left femur, with significant impaction at the fracture site and anterior-lateral angulation at the fracture site. Fracture does not appear to extend to the articular surface of either femoral condyles. No fracture seen within the proximal tibia or fibula. Extensive degenerative change at the right knee, tricompartmental, with most severe joint space narrowing and osteophyte formation at the medial compartment with abutment of the medial femoral head aunt tibial plateau.  Joint effusion is present, most prominent within the suprapatellar bursa, moderate in size. Superficial soft tissues are unremarkable. IMPRESSION: 1. Markedly displaced/comminuted fracture of the distal left femur, centered at the distal metaphysis, with prominent impaction at the fracture site and anterior-lateral angulation at the fracture site. Fracture does not appear to extend to the articular surface of either femoral condyle. 2. 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 3. Joint effusion. Electronically Signed   By: Franki Cabot M.D.   On: 02/19/2016 16:35   Dg Humerus Left  Result Date: 02/19/2016 CLINICAL DATA:  Fall.  Severe left leg pain.  Left arm pain. EXAM: LEFT HUMERUS - 2+ VIEW COMPARISON:  12/24/2008 FINDINGS: Old healed fracture within the proximal left humerus. No acute fracture. No subluxation or dislocation. IMPRESSION: No acute bony abnormality. Electronically Signed   By: Rolm Baptise M.D.   On: 02/19/2016 14:31   Dg Foot Complete Left  Result Date: 02/19/2016 CLINICAL DATA:  Patient fell today from standing position. Pt c/o severe left leg pain, left generalized arm pain, and right toe pain. EXAM: LEFT FOOT - COMPLETE 3+ VIEW COMPARISON:  None. FINDINGS: There is a nondisplaced, non comminuted fracture across the medial base of the proximal phalanx of the great toe. No other acute fracture.  No dislocation. Bones are diffusely demineralized. IMPRESSION: Nondisplaced fracture of the medial base of the proximal phalanx of left great toe. Electronically Signed   By: Lajean Manes M.D.   On: 02/19/2016 14:34   Dg Foot Complete Right  Result Date: 02/19/2016  CLINICAL DATA:  Patient fell today from standing position. Pt c/o severe left leg pain, left generalized arm pain, and right toe pain. EXAM: RIGHT FOOT COMPLETE - 3+ VIEW COMPARISON:  09/30/2013 FINDINGS: There is nondisplaced  fracture across the lateral base of the proximal phalanx of the great toe. No other acute fractures. Old healed fracture of the proximal first metatarsal. Joints are normally aligned. Bones are diffusely demineralized. IMPRESSION: 1. Nondisplaced fracture across the lateral base of the proximal phalanx of the right great toe. No other fractures. No dislocation. Electronically Signed   By: Lajean Manes M.D.   On: 02/19/2016 14:36   Dg C-arm 1-60 Min  Result Date: 02/20/2016 CLINICAL DATA:  Post left femur fracture repair EXAM: LEFT FEMUR 2 VIEWS; DG C-ARM 61-120 MIN COMPARISON:  02/19/2016 FINDINGS: The patient is status post open reduction internal fixation of distal left femoral fracture. There is intra medullary rod in left femur. At least three locking screws are noted in distal left femur. There is improvement with near anatomic alignment. IMPRESSION: Status post intraoperative repair of distal left femoral fracture. Intra medullary rod and locking screws are noted in left femur. There is improvement near anatomic alignment. Fluoroscopy time was 3 minutes 12 seconds. Please see the operative report. Electronically Signed   By: Lahoma Crocker M.D.   On: 02/20/2016 12:37   Dg Femur Min 2 Views Left  Result Date: 02/20/2016 CLINICAL DATA:  Post left femur fracture repair EXAM: LEFT FEMUR 2 VIEWS; DG C-ARM 61-120 MIN COMPARISON:  02/19/2016 FINDINGS: The patient is status post open reduction internal fixation of distal left femoral fracture. There is intra medullary rod in left femur. At least three locking screws are noted in distal left femur. There is improvement with near anatomic alignment. IMPRESSION: Status post intraoperative repair of distal left femoral fracture. Intra medullary rod and locking screws are noted in left femur. There is improvement near anatomic alignment. Fluoroscopy time was 3 minutes 12 seconds. Please see the operative report. Electronically Signed   By: Lahoma Crocker M.D.   On:  02/20/2016 12:37   Dg Femur Min 2 Views Left  Result Date: 02/19/2016 CLINICAL DATA:  Patient fell today from standing position. Pt c/o severe left leg pain, left generalized arm pain, and right toe pain. EXAM: LEFT FEMUR 2 VIEWS COMPARISON:  None. FINDINGS: There is an oblique fracture of the distal femur. Fracture is mildly comminuted. Fracture is also mildly displaced with the distal primary fracture component telescoping on the shaft component by 2.7 cm. There is approximate 1 cm of lateral displacement. There is mild posterior angulation. No other fracture.  No dislocation. Bones are demineralized. IMPRESSION: 1. Fracture of the distal left femur as described.  No dislocation. Electronically Signed   By: Lajean Manes M.D.   On: 02/19/2016 14:33    Assessment/Plan  #1-complains of dysuria back pain suspicious for UTI-patient says these are pretty classic symptoms of when she has a UTI will empirically start Cipro 250 mg twice a day for 7 days and restart 4 store twice a day for 10 days-will obtain a urinalysis and culture.  #2 diabetes type 2 blood sugars appear to be more stable since glipizide was discontinued blood sugars largely averaging in the mid 100s now the last several days at this point will monitor she continues on Lantus.  #3 hypertension 90 systolic at its point see is somewhat elevated in the 140s today however this does not appear to be persistent I see previous  readings 129/80-122/71 highest  that I see is 160/78 but this appears to be uncommon at this point will monitor she is on lisinopril as well as Norvasc.  #4-muscle  spasms apparently this has improved on Slow-Mag at this point continue to monitor   also will update a CBC with differential and metabolic panel tomorrow-she does have a history of anemia which appears to be improving she has been started on iron.   [place], [place], [place]

## 2016-03-20 ENCOUNTER — Encounter: Payer: Self-pay | Admitting: Internal Medicine

## 2016-03-20 LAB — URINE CULTURE

## 2016-03-26 ENCOUNTER — Encounter: Payer: Self-pay | Admitting: Internal Medicine

## 2016-03-26 ENCOUNTER — Non-Acute Institutional Stay: Payer: BLUE CROSS/BLUE SHIELD | Admitting: Internal Medicine

## 2016-03-26 DIAGNOSIS — S72402S Unspecified fracture of lower end of left femur, sequela: Secondary | ICD-10-CM | POA: Diagnosis not present

## 2016-03-26 DIAGNOSIS — E118 Type 2 diabetes mellitus with unspecified complications: Secondary | ICD-10-CM | POA: Diagnosis not present

## 2016-03-26 DIAGNOSIS — D62 Acute posthemorrhagic anemia: Secondary | ICD-10-CM | POA: Diagnosis not present

## 2016-03-26 DIAGNOSIS — I10 Essential (primary) hypertension: Secondary | ICD-10-CM

## 2016-03-26 DIAGNOSIS — S72402P Unspecified fracture of lower end of left femur, subsequent encounter for closed fracture with malunion: Secondary | ICD-10-CM

## 2016-03-26 NOTE — Progress Notes (Signed)
Location:   [facility] Room Number: 126/P Place of Service:  SNF [phone])  Provider: Granville Lewis  PCP: Unice Cobble, MD Patient Care Team: Hendricks Limes, MD as PCP - General (Internal Medicine)  Extended Emergency Contact Information Primary Emergency Contact: Rickey,John R Address: [address]          [place], [place] [address] Johnnette Litter of [place] Phone: [phone] Mobile Phone: [phone] Relation: Spouse  Code Status: DNR Goals of care:  Advanced Directive information Advanced Directives 03/26/2016  Does patient have an advance directive? Yes  Type of Advance Directive Out of facility DNR (pink MOST or yellow form)  Does patient want to make changes to advanced directive? No - Patient declined  Copy of advanced directive(s) in chart? Yes  Would patient like information on creating an advanced directive? -  Pre-existing out of facility DNR order (yellow form or pink MOST form) -     No Known Allergies  Chief complaint-discharge note  HPI:  65 y.o. female  who is slated for discharge later this week. She is here for rehabilitation after sustaining a left femur fracture and underwent a repair.  She is followed by orthopedics and continues to be nonweightbearing.  She is currently ambulating in a wheelchair.  She is receiving oxycodone as needed for pain apparently takes this fairly frequently I did discuss trying to back off on the oxycodone and she appears to be in agreement-will give her 1 tab instead 01-2 tabs every 4 hours when necessary and monitor for effectiveness.  She also is a type II diabetic she had been on glipizide and Lantus however she was having some low blood sugars and we discontinue the glipizide she continues on Lantus 15 units daily and blood sugars appear to be pretty stable largely in the mid 100s occasionally up into the 200s but this appears to be quite rare baseline appears to be in the mid 100s.  She  continues on Lovenox for DVT prophylaxis as well.  Patient still has extensive care needs per discussion with therapy-has some trouble still with transfers-she will need support at home and it is my understanding her husband will take a leave of absence from work to help her.  Apparently she is being discharged secondary to insurance issues because of her nonweightbearing status   Regards hypertension this appears stable she is on Norvasc 5 mg a day as well as low-dose lisinopril was started for its renal protective effect-recent blood pressure 129/80.        Past Medical History:  Diagnosis Date  . Depression   . Diabetes mellitus without complication ([facility])   . Hypertension   . Osteoporosis   . UTI (lower urinary tract infection) 01/2016   Cipro for Klebsiella pneumoniae isolate  . UTI (lower urinary tract infection) 02/19/2016   Klebsiella    Past Surgical History:  Procedure Laterality Date  . ABDOMINAL HYSTERECTOMY    . CESAREAN SECTION    . CHOLECYSTECTOMY    . FEMUR IM NAIL Left 02/20/2016  . FEMUR IM NAIL Left 02/20/2016   Procedure: INTRAMEDULLARY (IM) RETROGRADE FEMORAL NAILING;  Surgeon: Leandrew Koyanagi, MD;  Location: [facility];  Service: Orthopedics;  Laterality: Left;  . PANCREAS SURGERY  1967   1 cyst excised and one cyst drained  . VEIN LIGATION AND STRIPPING    . WRIST FRACTURE SURGERY        reports that she has never smoked. She has never used smokeless  tobacco. She reports that she does not drink alcohol or use drugs. Social History   Social History  . Marital status: Married    Spouse name: N/A  . Number of children: N/A  . Years of education: N/A   Occupational History  . Not on file.   Social History Main Topics  . Smoking status: Never Smoker  . Smokeless tobacco: Never Used  . Alcohol use No  . Drug use: No  . Sexual activity: Not on file   Other Topics Concern  . Not on file   Social History Narrative  . No narrative on file   Functional  Status Survey:    No Known Allergies  Pertinent  Health Maintenance Due  Topic Date Due  . INFLUENZA VACCINE  11/15/2016 (Originally 03/18/2016)  . FOOT EXAM  03/04/2017 (Originally [birth year]19/1960)  . OPHTHALMOLOGY EXAM  03/04/2017 (Originally 12/19/1960)  . URINE MICROALBUMIN  03/04/2017 (Originally 12/19/1960)  . DEXA SCAN  03/04/2017 (Originally 12/20/2015)  . PNA vac Low Risk Adult (1 of 2 - PCV13) 03/04/2017 (Originally 12/20/2015)  . PAP SMEAR  03/05/2019 (Originally 12/20/1971)  . COLONOSCOPY  03/04/2026 (Originally 12/19/2000)  . HEMOGLOBIN A1C  08/29/2016  . MAMMOGRAM  09/07/2016    Medications: Current Outpatient Prescriptions on File Prior to Visit  Medication Sig Dispense Refill  . acetaminophen (TYLENOL) 325 MG tablet Take 650 mg by mouth every 6 (six) hours as needed for mild pain.    Marland Kitchen amLODipine (NORVASC) 5 MG tablet Take 1 tablet (5 mg total) by mouth daily. 30 tablet 0  . buPROPion (WELLBUTRIN XL) 300 MG 24 hr tablet Take 300 mg by mouth daily.    . calcium-vitamin D (OSCAL WITH D) 500-200 MG-UNIT tablet Take 1 tablet by mouth 2 (two) times daily. 30 tablet 0  . clonazePAM (KLONOPIN) 1 MG tablet Take 1 tablet (1 mg total) by mouth at bedtime. 30 tablet 5  . enoxaparin (LOVENOX) 40 MG/0.4ML injection Inject 0.4 mLs (40 mg total) into the skin daily. 14 Syringe 0  . ferrous sulfate (KP FERROUS SULFATE) 325 (65 FE) MG tablet Take 325 mg by mouth 2 (two) times daily with a meal.     . FLUoxetine (PROZAC) 40 MG capsule Take 40 mg by mouth daily.    . insulin glargine (LANTUS) 100 UNIT/ML injection Inject 0.15 mLs (15 Units total) into the skin daily. 10 mL 11  . lisinopril (PRINIVIL,ZESTRIL) 20 MG tablet Take 20 mg by mouth daily.    . magnesium chloride (SLOW-MAG) 64 MG TBEC SR tablet Take 2 tablets by mouth at bedtime.    . methocarbamol (ROBAXIN) 500 MG tablet Take 500 mg by mouth every 6 (six) hours as needed for muscle spasms.    Marland Kitchen oxyCODONE (OXY IR/ROXICODONE) 5 MG immediate release  tablet Take 1 tablet by mouth as needed for Mild pain every four hours. Take 2 tablets by mouth as needed for Moderate pain every four hours. Take 3 tablets by mouth for severe pain as needed every four hours    . promethazine (PHENERGAN) 25 MG tablet Take 25 mg by mouth every 6 (six) hours as needed for nausea or vomiting.    . senna-docusate (SENOKOT-S) 8.6-50 MG tablet Take 1 tablet by mouth at bedtime.    . Vitamin D, Ergocalciferol, (DRISDOL) 50000 units CAPS capsule Take 50,000 Units by mouth every 7 (seven) days.     No current facility-administered medications on file prior to visit.     Review of Systems  General no complaints of fever    Skin does not complain of rashes or itching.  Head ears eyes nose mouth throat no complaints of sore throat or visual changes.  Respiratory is not complaining shortness breath or cough.  Cardiac no chest pain.  GI is not complaining of abdominal discomfort nausea vomiting diarrhea constipation .  Musculoskeletal  Says her pain is controlled with when necessary oxycodone again we are trying to reduce the dose here- she also has Robaxin as needed-she has also been started on Slow-Mag with history of muscle spasms and apparently this is helping significantly.  Neurologic is not complaining of dizziness or headache does have some history of numbness lower extremities.  And psych is not complaining currently of anxiety or depression but does have some history of depression  There is no height or weight on file to calculate BMI. Physical Exam Temperature 97.8 pulse 84 respirations 20 blood pressure 129/80  This is a pleasant female in no distress sitting comfortably in wheelchair.  Her skin is warm and dry she does have some bruising over the base of her toes bilaterally as well as over the dorsal aspect of her hands bilaterally. Surgical scar appears to be well-healed left knee  Eyes pupils appear reactive light sclera and  icteric clear visual acuity appears grossly intact.  Oropharynx clear mucous membranes moist.  Chest is clear to auscultation there is no labored breathing.  Heart is regular rate and rhythm without murmur gallop or rub she does not really have significant lower extremity edema pedal pulses are intact bilaterally.  Abdomen soft obese soft nontender positive bowel sounds.  Musculoskeletal  Moves all extremities 4 although somewhat limited left lower extremity status post recent surgery again she is nonweightbearing upper extremity strength appears to be intact.    Per therapy- she is having some trouble however transferring.       Neurologic- -cranial nerves are grossly intact her speech is clear strength appears relatively intact all 4 extremities.  Psych she is alert and oriented pleasant and appropriate Labs reviewed: Basic Metabolic Panel:  Recent Labs  02/25/16 0530 03/07/16 0745 03/11/16 0725 03/16/16 0415  NA 137 137  --  138  K 3.9 3.6  --  3.7  CL 103 99*  --  108  CO2 28 28  --  26  GLUCOSE 166* 126*  --  123*  BUN 21* 11  --  20  CREATININE 0.86 0.78  --  0.90  CALCIUM 8.8* 9.0  --  8.8*  MG  --  1.4* 1.4* 1.7   Liver Function Tests: No results for input(s): AST, ALT, ALKPHOS, BILITOT, PROT, ALBUMIN in the last 8760 hours. No results for input(s): LIPASE, AMYLASE in the last 8760 hours. No results for input(s): AMMONIA in the last 8760 hours. CBC:  Recent Labs  02/19/16 1514  02/25/16 0530 03/05/16 0700 03/11/16 0500  WBC 15.1*  < > 8.6 8.0 8.2  NEUTROABS 13.9*  --   --   --  5.6  HGB 10.7*  < > 8.7* 8.5* 9.8*  HCT 34.5*  < > 28.1* 27.5* 31.3*  MCV 92.5  < > 90.6 90.2 89.7  PLT 289  < > 340 417* 376  < > = values in this interval not displayed. Cardiac Enzymes:  Recent Labs  03/07/16 0745  CKTOTAL 23*   BNP: Invalid input(s): POCBNP CBG:  Recent Labs  02/21/16 2156 02/22/16 0606 02/22/16 1152  GLUCAP 131* 144* 171*  Procedures and Imaging Studies During Stay: No results found.  Assessment/Plan:    #1 history of distal femur fracture-again she continues to be nonweightbearing-apparently discharge is prompted by insurance reimbursement issues-she will need continued PT and OT as well as orthopedic follow-up-she will receive oxycodone as needed for pain again will have reduce the dose as noted above.  Also continues on Robaxin as needed.  She continues on Lovenox for DVT prophylaxis although unsure if this really needs to be continued at home-orthopedics will have to be contacted about this  She had complained earlier of muscle spasms with this appears better with the Slow-Mag.  #2 anemia likely postop hemoglobin appears to be trending up most recently 9.8 on 03/11/2016 we will update this.  #3 hypertension-again this appears reasonably well controlled will need follow-up by primary care provider she continues on Norvasc as well as lisinopril.  #4 diabetes type 2 again she is on Lantus glipizide was discontinued secondary to concerns of hypoglycemia CBGs appear stable-she will need teaching for taking insulin when she goes home-another option would be starting an oral agent but would be hesitant to do this without time to evaluate its effectiveness before discharge   #5 depression-this appears stable on Prozac as well as Wellbutrin.  #6 history of renal insufficiency this appears to have stabilized with recent creatinine of 0.9 BUN of 20 on lab done 03/16/2016-will update this   #Anxiety she is on Klonopin as needed this appears to be stable as well.  Again there is some concern here with patient going home with her limited mobility but she will have her husband apparently with her at home he is taking a leave of absence from work.  Close follow-up will be needed by orthopedics.  She will need continued PT and OT as well.  [id number] note greater than 30 minutes spent on this discharge  summary-including reviewing her labs-her chart-discussion with therapy-discussion with nursing-assessing patient-discussing her concerns at bedside-and coordinating and formulating a plan of care

## 2016-03-27 ENCOUNTER — Encounter (HOSPITAL_COMMUNITY)
Admission: RE | Admit: 2016-03-27 | Discharge: 2016-03-27 | Disposition: A | Discharge status: Home / Self Care | Payer: Medicare Other | Source: Skilled Nursing Facility | Attending: *Deleted | Admitting: *Deleted

## 2016-03-27 LAB — CBC WITH DIFFERENTIAL/PLATELET
BASOS PCT: 1 %
Basophils Absolute: 0 10*3/uL (ref 0.0–0.1)
Eosinophils Absolute: 0.3 10*3/uL (ref 0.0–0.7)
Eosinophils Relative: 4 %
HEMATOCRIT: 37.8 % (ref 36.0–46.0)
HEMOGLOBIN: 11.7 g/dL — AB (ref 12.0–15.0)
LYMPHS ABS: 1.4 10*3/uL (ref 0.7–4.0)
Lymphocytes Relative: 16 %
MCH: 27 pg (ref 26.0–34.0)
MCHC: 31 g/dL (ref 30.0–36.0)
MCV: 87.3 fL (ref 78.0–100.0)
MONO ABS: 0.8 10*3/uL (ref 0.1–1.0)
MONOS PCT: 9 %
NEUTROS ABS: 6.2 10*3/uL (ref 1.7–7.7)
Neutrophils Relative %: 70 %
Platelets: 333 10*3/uL (ref 150–400)
RBC: 4.33 MIL/uL (ref 3.87–5.11)
RDW: 14.2 % (ref 11.5–15.5)
WBC: 8.8 10*3/uL (ref 4.0–10.5)

## 2016-03-27 LAB — BASIC METABOLIC PANEL
ANION GAP: 7 (ref 5–15)
BUN: 23 mg/dL — ABNORMAL HIGH (ref 6–20)
CALCIUM: 9.2 mg/dL (ref 8.9–10.3)
CHLORIDE: 105 mmol/L (ref 101–111)
CO2: 24 mmol/L (ref 22–32)
Creatinine, Ser: 0.97 mg/dL (ref 0.44–1.00)
GFR calc Af Amer: >60 mL/min (ref >60)
GFR calc non Af Amer: 60 mL/min — ABNORMAL LOW (ref >60)
GLUCOSE: 181 mg/dL — AB (ref 65–99)
Potassium: 4.2 mmol/L (ref 3.5–5.1)
Sodium: 136 mmol/L (ref 135–145)

## 2016-03-28 DIAGNOSIS — S7292XA Unspecified fracture of left femur, initial encounter for closed fracture: Secondary | ICD-10-CM | POA: Diagnosis not present

## 2016-03-31 DIAGNOSIS — Z9181 History of falling: Secondary | ICD-10-CM | POA: Diagnosis not present

## 2016-03-31 DIAGNOSIS — S92415D Nondisplaced fracture of proximal phalanx of left great toe, subsequent encounter for fracture with routine healing: Secondary | ICD-10-CM | POA: Diagnosis not present

## 2016-03-31 DIAGNOSIS — Z7901 Long term (current) use of anticoagulants: Secondary | ICD-10-CM | POA: Diagnosis not present

## 2016-03-31 DIAGNOSIS — F329 Major depressive disorder, single episode, unspecified: Secondary | ICD-10-CM | POA: Diagnosis not present

## 2016-03-31 DIAGNOSIS — E119 Type 2 diabetes mellitus without complications: Secondary | ICD-10-CM | POA: Diagnosis not present

## 2016-03-31 DIAGNOSIS — R32 Unspecified urinary incontinence: Secondary | ICD-10-CM | POA: Diagnosis not present

## 2016-03-31 DIAGNOSIS — I10 Essential (primary) hypertension: Secondary | ICD-10-CM | POA: Diagnosis not present

## 2016-03-31 DIAGNOSIS — S72452D Displaced supracondylar fracture without intracondylar extension of lower end of left femur, subsequent encounter for closed fracture with routine healing: Secondary | ICD-10-CM | POA: Diagnosis not present

## 2016-03-31 DIAGNOSIS — M810 Age-related osteoporosis without current pathological fracture: Secondary | ICD-10-CM | POA: Diagnosis not present

## 2016-03-31 DIAGNOSIS — Z7984 Long term (current) use of oral hypoglycemic drugs: Secondary | ICD-10-CM | POA: Diagnosis not present

## 2016-03-31 DIAGNOSIS — I700 Atherosclerosis of aorta: Secondary | ICD-10-CM | POA: Diagnosis not present

## 2016-03-31 DIAGNOSIS — M1711 Unilateral primary osteoarthritis, right knee: Secondary | ICD-10-CM | POA: Diagnosis not present

## 2016-04-01 DIAGNOSIS — I700 Atherosclerosis of aorta: Secondary | ICD-10-CM | POA: Diagnosis not present

## 2016-04-01 DIAGNOSIS — M810 Age-related osteoporosis without current pathological fracture: Secondary | ICD-10-CM | POA: Diagnosis not present

## 2016-04-01 DIAGNOSIS — S92415D Nondisplaced fracture of proximal phalanx of left great toe, subsequent encounter for fracture with routine healing: Secondary | ICD-10-CM | POA: Diagnosis not present

## 2016-04-01 DIAGNOSIS — Z7901 Long term (current) use of anticoagulants: Secondary | ICD-10-CM | POA: Diagnosis not present

## 2016-04-01 DIAGNOSIS — S72452D Displaced supracondylar fracture without intracondylar extension of lower end of left femur, subsequent encounter for closed fracture with routine healing: Secondary | ICD-10-CM | POA: Diagnosis not present

## 2016-04-01 DIAGNOSIS — M1711 Unilateral primary osteoarthritis, right knee: Secondary | ICD-10-CM | POA: Diagnosis not present

## 2016-04-01 DIAGNOSIS — Z7984 Long term (current) use of oral hypoglycemic drugs: Secondary | ICD-10-CM | POA: Diagnosis not present

## 2016-04-01 DIAGNOSIS — I10 Essential (primary) hypertension: Secondary | ICD-10-CM | POA: Diagnosis not present

## 2016-04-01 DIAGNOSIS — E119 Type 2 diabetes mellitus without complications: Secondary | ICD-10-CM | POA: Diagnosis not present

## 2016-04-01 DIAGNOSIS — F329 Major depressive disorder, single episode, unspecified: Secondary | ICD-10-CM | POA: Diagnosis not present

## 2016-04-01 DIAGNOSIS — Z9181 History of falling: Secondary | ICD-10-CM | POA: Diagnosis not present

## 2016-04-01 DIAGNOSIS — R32 Unspecified urinary incontinence: Secondary | ICD-10-CM | POA: Diagnosis not present

## 2016-04-03 DIAGNOSIS — F329 Major depressive disorder, single episode, unspecified: Secondary | ICD-10-CM | POA: Diagnosis not present

## 2016-04-03 DIAGNOSIS — Z7901 Long term (current) use of anticoagulants: Secondary | ICD-10-CM | POA: Diagnosis not present

## 2016-04-03 DIAGNOSIS — M810 Age-related osteoporosis without current pathological fracture: Secondary | ICD-10-CM | POA: Diagnosis not present

## 2016-04-03 DIAGNOSIS — Z7984 Long term (current) use of oral hypoglycemic drugs: Secondary | ICD-10-CM | POA: Diagnosis not present

## 2016-04-03 DIAGNOSIS — S72452D Displaced supracondylar fracture without intracondylar extension of lower end of left femur, subsequent encounter for closed fracture with routine healing: Secondary | ICD-10-CM | POA: Diagnosis not present

## 2016-04-03 DIAGNOSIS — R32 Unspecified urinary incontinence: Secondary | ICD-10-CM | POA: Diagnosis not present

## 2016-04-03 DIAGNOSIS — I10 Essential (primary) hypertension: Secondary | ICD-10-CM | POA: Diagnosis not present

## 2016-04-03 DIAGNOSIS — E119 Type 2 diabetes mellitus without complications: Secondary | ICD-10-CM | POA: Diagnosis not present

## 2016-04-03 DIAGNOSIS — I700 Atherosclerosis of aorta: Secondary | ICD-10-CM | POA: Diagnosis not present

## 2016-04-03 DIAGNOSIS — Z9181 History of falling: Secondary | ICD-10-CM | POA: Diagnosis not present

## 2016-04-03 DIAGNOSIS — S92415D Nondisplaced fracture of proximal phalanx of left great toe, subsequent encounter for fracture with routine healing: Secondary | ICD-10-CM | POA: Diagnosis not present

## 2016-04-03 DIAGNOSIS — M1711 Unilateral primary osteoarthritis, right knee: Secondary | ICD-10-CM | POA: Diagnosis not present

## 2016-04-04 DIAGNOSIS — F329 Major depressive disorder, single episode, unspecified: Secondary | ICD-10-CM | POA: Diagnosis not present

## 2016-04-04 DIAGNOSIS — S72452D Displaced supracondylar fracture without intracondylar extension of lower end of left femur, subsequent encounter for closed fracture with routine healing: Secondary | ICD-10-CM | POA: Diagnosis not present

## 2016-04-04 DIAGNOSIS — Z7901 Long term (current) use of anticoagulants: Secondary | ICD-10-CM | POA: Diagnosis not present

## 2016-04-04 DIAGNOSIS — M810 Age-related osteoporosis without current pathological fracture: Secondary | ICD-10-CM | POA: Diagnosis not present

## 2016-04-04 DIAGNOSIS — I700 Atherosclerosis of aorta: Secondary | ICD-10-CM | POA: Diagnosis not present

## 2016-04-04 DIAGNOSIS — I10 Essential (primary) hypertension: Secondary | ICD-10-CM | POA: Diagnosis not present

## 2016-04-04 DIAGNOSIS — S92415D Nondisplaced fracture of proximal phalanx of left great toe, subsequent encounter for fracture with routine healing: Secondary | ICD-10-CM | POA: Diagnosis not present

## 2016-04-04 DIAGNOSIS — E119 Type 2 diabetes mellitus without complications: Secondary | ICD-10-CM | POA: Diagnosis not present

## 2016-04-04 DIAGNOSIS — Z7984 Long term (current) use of oral hypoglycemic drugs: Secondary | ICD-10-CM | POA: Diagnosis not present

## 2016-04-04 DIAGNOSIS — M1711 Unilateral primary osteoarthritis, right knee: Secondary | ICD-10-CM | POA: Diagnosis not present

## 2016-04-04 DIAGNOSIS — Z9181 History of falling: Secondary | ICD-10-CM | POA: Diagnosis not present

## 2016-04-04 DIAGNOSIS — R32 Unspecified urinary incontinence: Secondary | ICD-10-CM | POA: Diagnosis not present

## 2016-04-08 DIAGNOSIS — M1711 Unilateral primary osteoarthritis, right knee: Secondary | ICD-10-CM | POA: Diagnosis not present

## 2016-04-08 DIAGNOSIS — E119 Type 2 diabetes mellitus without complications: Secondary | ICD-10-CM | POA: Diagnosis not present

## 2016-04-08 DIAGNOSIS — F329 Major depressive disorder, single episode, unspecified: Secondary | ICD-10-CM | POA: Diagnosis not present

## 2016-04-08 DIAGNOSIS — S72452D Displaced supracondylar fracture without intracondylar extension of lower end of left femur, subsequent encounter for closed fracture with routine healing: Secondary | ICD-10-CM | POA: Diagnosis not present

## 2016-04-08 DIAGNOSIS — I700 Atherosclerosis of aorta: Secondary | ICD-10-CM | POA: Diagnosis not present

## 2016-04-08 DIAGNOSIS — Z7984 Long term (current) use of oral hypoglycemic drugs: Secondary | ICD-10-CM | POA: Diagnosis not present

## 2016-04-08 DIAGNOSIS — Z9181 History of falling: Secondary | ICD-10-CM | POA: Diagnosis not present

## 2016-04-08 DIAGNOSIS — M810 Age-related osteoporosis without current pathological fracture: Secondary | ICD-10-CM | POA: Diagnosis not present

## 2016-04-08 DIAGNOSIS — I10 Essential (primary) hypertension: Secondary | ICD-10-CM | POA: Diagnosis not present

## 2016-04-08 DIAGNOSIS — S92415D Nondisplaced fracture of proximal phalanx of left great toe, subsequent encounter for fracture with routine healing: Secondary | ICD-10-CM | POA: Diagnosis not present

## 2016-04-08 DIAGNOSIS — Z7901 Long term (current) use of anticoagulants: Secondary | ICD-10-CM | POA: Diagnosis not present

## 2016-04-08 DIAGNOSIS — R32 Unspecified urinary incontinence: Secondary | ICD-10-CM | POA: Diagnosis not present

## 2016-04-10 DIAGNOSIS — S92415D Nondisplaced fracture of proximal phalanx of left great toe, subsequent encounter for fracture with routine healing: Secondary | ICD-10-CM | POA: Diagnosis not present

## 2016-04-10 DIAGNOSIS — I10 Essential (primary) hypertension: Secondary | ICD-10-CM | POA: Diagnosis not present

## 2016-04-10 DIAGNOSIS — Z7901 Long term (current) use of anticoagulants: Secondary | ICD-10-CM | POA: Diagnosis not present

## 2016-04-10 DIAGNOSIS — E119 Type 2 diabetes mellitus without complications: Secondary | ICD-10-CM | POA: Diagnosis not present

## 2016-04-10 DIAGNOSIS — M1711 Unilateral primary osteoarthritis, right knee: Secondary | ICD-10-CM | POA: Diagnosis not present

## 2016-04-10 DIAGNOSIS — M810 Age-related osteoporosis without current pathological fracture: Secondary | ICD-10-CM | POA: Diagnosis not present

## 2016-04-10 DIAGNOSIS — I700 Atherosclerosis of aorta: Secondary | ICD-10-CM | POA: Diagnosis not present

## 2016-04-10 DIAGNOSIS — Z9181 History of falling: Secondary | ICD-10-CM | POA: Diagnosis not present

## 2016-04-10 DIAGNOSIS — Z7984 Long term (current) use of oral hypoglycemic drugs: Secondary | ICD-10-CM | POA: Diagnosis not present

## 2016-04-10 DIAGNOSIS — R32 Unspecified urinary incontinence: Secondary | ICD-10-CM | POA: Diagnosis not present

## 2016-04-10 DIAGNOSIS — S72452D Displaced supracondylar fracture without intracondylar extension of lower end of left femur, subsequent encounter for closed fracture with routine healing: Secondary | ICD-10-CM | POA: Diagnosis not present

## 2016-04-10 DIAGNOSIS — F329 Major depressive disorder, single episode, unspecified: Secondary | ICD-10-CM | POA: Diagnosis not present

## 2016-04-11 DIAGNOSIS — I700 Atherosclerosis of aorta: Secondary | ICD-10-CM | POA: Diagnosis not present

## 2016-04-11 DIAGNOSIS — Z9181 History of falling: Secondary | ICD-10-CM | POA: Diagnosis not present

## 2016-04-11 DIAGNOSIS — M810 Age-related osteoporosis without current pathological fracture: Secondary | ICD-10-CM | POA: Diagnosis not present

## 2016-04-11 DIAGNOSIS — S92415D Nondisplaced fracture of proximal phalanx of left great toe, subsequent encounter for fracture with routine healing: Secondary | ICD-10-CM | POA: Diagnosis not present

## 2016-04-11 DIAGNOSIS — S72452D Displaced supracondylar fracture without intracondylar extension of lower end of left femur, subsequent encounter for closed fracture with routine healing: Secondary | ICD-10-CM | POA: Diagnosis not present

## 2016-04-11 DIAGNOSIS — I10 Essential (primary) hypertension: Secondary | ICD-10-CM | POA: Diagnosis not present

## 2016-04-11 DIAGNOSIS — F329 Major depressive disorder, single episode, unspecified: Secondary | ICD-10-CM | POA: Diagnosis not present

## 2016-04-11 DIAGNOSIS — Z7984 Long term (current) use of oral hypoglycemic drugs: Secondary | ICD-10-CM | POA: Diagnosis not present

## 2016-04-11 DIAGNOSIS — R32 Unspecified urinary incontinence: Secondary | ICD-10-CM | POA: Diagnosis not present

## 2016-04-11 DIAGNOSIS — M1711 Unilateral primary osteoarthritis, right knee: Secondary | ICD-10-CM | POA: Diagnosis not present

## 2016-04-11 DIAGNOSIS — Z7901 Long term (current) use of anticoagulants: Secondary | ICD-10-CM | POA: Diagnosis not present

## 2016-04-11 DIAGNOSIS — E119 Type 2 diabetes mellitus without complications: Secondary | ICD-10-CM | POA: Diagnosis not present

## 2016-04-15 DIAGNOSIS — I700 Atherosclerosis of aorta: Secondary | ICD-10-CM | POA: Diagnosis not present

## 2016-04-15 DIAGNOSIS — E119 Type 2 diabetes mellitus without complications: Secondary | ICD-10-CM | POA: Diagnosis not present

## 2016-04-15 DIAGNOSIS — S72452D Displaced supracondylar fracture without intracondylar extension of lower end of left femur, subsequent encounter for closed fracture with routine healing: Secondary | ICD-10-CM | POA: Diagnosis not present

## 2016-04-15 DIAGNOSIS — M810 Age-related osteoporosis without current pathological fracture: Secondary | ICD-10-CM | POA: Diagnosis not present

## 2016-04-15 DIAGNOSIS — R32 Unspecified urinary incontinence: Secondary | ICD-10-CM | POA: Diagnosis not present

## 2016-04-15 DIAGNOSIS — F329 Major depressive disorder, single episode, unspecified: Secondary | ICD-10-CM | POA: Diagnosis not present

## 2016-04-15 DIAGNOSIS — Z7901 Long term (current) use of anticoagulants: Secondary | ICD-10-CM | POA: Diagnosis not present

## 2016-04-15 DIAGNOSIS — I10 Essential (primary) hypertension: Secondary | ICD-10-CM | POA: Diagnosis not present

## 2016-04-15 DIAGNOSIS — Z7984 Long term (current) use of oral hypoglycemic drugs: Secondary | ICD-10-CM | POA: Diagnosis not present

## 2016-04-15 DIAGNOSIS — M1711 Unilateral primary osteoarthritis, right knee: Secondary | ICD-10-CM | POA: Diagnosis not present

## 2016-04-15 DIAGNOSIS — S92415D Nondisplaced fracture of proximal phalanx of left great toe, subsequent encounter for fracture with routine healing: Secondary | ICD-10-CM | POA: Diagnosis not present

## 2016-04-15 DIAGNOSIS — Z9181 History of falling: Secondary | ICD-10-CM | POA: Diagnosis not present

## 2016-04-16 DIAGNOSIS — M1711 Unilateral primary osteoarthritis, right knee: Secondary | ICD-10-CM | POA: Diagnosis not present

## 2016-04-16 DIAGNOSIS — S92415D Nondisplaced fracture of proximal phalanx of left great toe, subsequent encounter for fracture with routine healing: Secondary | ICD-10-CM | POA: Diagnosis not present

## 2016-04-16 DIAGNOSIS — I10 Essential (primary) hypertension: Secondary | ICD-10-CM | POA: Diagnosis not present

## 2016-04-16 DIAGNOSIS — R32 Unspecified urinary incontinence: Secondary | ICD-10-CM | POA: Diagnosis not present

## 2016-04-16 DIAGNOSIS — S72452D Displaced supracondylar fracture without intracondylar extension of lower end of left femur, subsequent encounter for closed fracture with routine healing: Secondary | ICD-10-CM | POA: Diagnosis not present

## 2016-04-16 DIAGNOSIS — Z7901 Long term (current) use of anticoagulants: Secondary | ICD-10-CM | POA: Diagnosis not present

## 2016-04-16 DIAGNOSIS — M810 Age-related osteoporosis without current pathological fracture: Secondary | ICD-10-CM | POA: Diagnosis not present

## 2016-04-16 DIAGNOSIS — I700 Atherosclerosis of aorta: Secondary | ICD-10-CM | POA: Diagnosis not present

## 2016-04-16 DIAGNOSIS — F329 Major depressive disorder, single episode, unspecified: Secondary | ICD-10-CM | POA: Diagnosis not present

## 2016-04-16 DIAGNOSIS — Z7984 Long term (current) use of oral hypoglycemic drugs: Secondary | ICD-10-CM | POA: Diagnosis not present

## 2016-04-16 DIAGNOSIS — Z9181 History of falling: Secondary | ICD-10-CM | POA: Diagnosis not present

## 2016-04-16 DIAGNOSIS — E119 Type 2 diabetes mellitus without complications: Secondary | ICD-10-CM | POA: Diagnosis not present

## 2016-04-17 DIAGNOSIS — S72452D Displaced supracondylar fracture without intracondylar extension of lower end of left femur, subsequent encounter for closed fracture with routine healing: Secondary | ICD-10-CM | POA: Diagnosis not present

## 2016-04-18 DIAGNOSIS — I10 Essential (primary) hypertension: Secondary | ICD-10-CM | POA: Diagnosis not present

## 2016-04-18 DIAGNOSIS — I700 Atherosclerosis of aorta: Secondary | ICD-10-CM | POA: Diagnosis not present

## 2016-04-18 DIAGNOSIS — Z7901 Long term (current) use of anticoagulants: Secondary | ICD-10-CM | POA: Diagnosis not present

## 2016-04-18 DIAGNOSIS — Z9181 History of falling: Secondary | ICD-10-CM | POA: Diagnosis not present

## 2016-04-18 DIAGNOSIS — R32 Unspecified urinary incontinence: Secondary | ICD-10-CM | POA: Diagnosis not present

## 2016-04-18 DIAGNOSIS — Z7984 Long term (current) use of oral hypoglycemic drugs: Secondary | ICD-10-CM | POA: Diagnosis not present

## 2016-04-18 DIAGNOSIS — E119 Type 2 diabetes mellitus without complications: Secondary | ICD-10-CM | POA: Diagnosis not present

## 2016-04-18 DIAGNOSIS — M1711 Unilateral primary osteoarthritis, right knee: Secondary | ICD-10-CM | POA: Diagnosis not present

## 2016-04-18 DIAGNOSIS — S72452D Displaced supracondylar fracture without intracondylar extension of lower end of left femur, subsequent encounter for closed fracture with routine healing: Secondary | ICD-10-CM | POA: Diagnosis not present

## 2016-04-18 DIAGNOSIS — M810 Age-related osteoporosis without current pathological fracture: Secondary | ICD-10-CM | POA: Diagnosis not present

## 2016-04-18 DIAGNOSIS — F329 Major depressive disorder, single episode, unspecified: Secondary | ICD-10-CM | POA: Diagnosis not present

## 2016-04-18 DIAGNOSIS — S92415D Nondisplaced fracture of proximal phalanx of left great toe, subsequent encounter for fracture with routine healing: Secondary | ICD-10-CM | POA: Diagnosis not present

## 2016-04-21 DIAGNOSIS — I700 Atherosclerosis of aorta: Secondary | ICD-10-CM | POA: Diagnosis not present

## 2016-04-21 DIAGNOSIS — F329 Major depressive disorder, single episode, unspecified: Secondary | ICD-10-CM | POA: Diagnosis not present

## 2016-04-21 DIAGNOSIS — M810 Age-related osteoporosis without current pathological fracture: Secondary | ICD-10-CM | POA: Diagnosis not present

## 2016-04-21 DIAGNOSIS — S92415D Nondisplaced fracture of proximal phalanx of left great toe, subsequent encounter for fracture with routine healing: Secondary | ICD-10-CM | POA: Diagnosis not present

## 2016-04-21 DIAGNOSIS — R32 Unspecified urinary incontinence: Secondary | ICD-10-CM | POA: Diagnosis not present

## 2016-04-21 DIAGNOSIS — S72452D Displaced supracondylar fracture without intracondylar extension of lower end of left femur, subsequent encounter for closed fracture with routine healing: Secondary | ICD-10-CM | POA: Diagnosis not present

## 2016-04-21 DIAGNOSIS — Z9181 History of falling: Secondary | ICD-10-CM | POA: Diagnosis not present

## 2016-04-21 DIAGNOSIS — Z7984 Long term (current) use of oral hypoglycemic drugs: Secondary | ICD-10-CM | POA: Diagnosis not present

## 2016-04-21 DIAGNOSIS — E119 Type 2 diabetes mellitus without complications: Secondary | ICD-10-CM | POA: Diagnosis not present

## 2016-04-21 DIAGNOSIS — Z7901 Long term (current) use of anticoagulants: Secondary | ICD-10-CM | POA: Diagnosis not present

## 2016-04-21 DIAGNOSIS — M1711 Unilateral primary osteoarthritis, right knee: Secondary | ICD-10-CM | POA: Diagnosis not present

## 2016-04-21 DIAGNOSIS — I10 Essential (primary) hypertension: Secondary | ICD-10-CM | POA: Diagnosis not present

## 2016-04-24 DIAGNOSIS — S92415D Nondisplaced fracture of proximal phalanx of left great toe, subsequent encounter for fracture with routine healing: Secondary | ICD-10-CM | POA: Diagnosis not present

## 2016-04-24 DIAGNOSIS — F329 Major depressive disorder, single episode, unspecified: Secondary | ICD-10-CM | POA: Diagnosis not present

## 2016-04-24 DIAGNOSIS — I700 Atherosclerosis of aorta: Secondary | ICD-10-CM | POA: Diagnosis not present

## 2016-04-24 DIAGNOSIS — Z7901 Long term (current) use of anticoagulants: Secondary | ICD-10-CM | POA: Diagnosis not present

## 2016-04-24 DIAGNOSIS — I10 Essential (primary) hypertension: Secondary | ICD-10-CM | POA: Diagnosis not present

## 2016-04-24 DIAGNOSIS — M810 Age-related osteoporosis without current pathological fracture: Secondary | ICD-10-CM | POA: Diagnosis not present

## 2016-04-24 DIAGNOSIS — R32 Unspecified urinary incontinence: Secondary | ICD-10-CM | POA: Diagnosis not present

## 2016-04-24 DIAGNOSIS — E119 Type 2 diabetes mellitus without complications: Secondary | ICD-10-CM | POA: Diagnosis not present

## 2016-04-24 DIAGNOSIS — M1711 Unilateral primary osteoarthritis, right knee: Secondary | ICD-10-CM | POA: Diagnosis not present

## 2016-04-24 DIAGNOSIS — S72452D Displaced supracondylar fracture without intracondylar extension of lower end of left femur, subsequent encounter for closed fracture with routine healing: Secondary | ICD-10-CM | POA: Diagnosis not present

## 2016-04-24 DIAGNOSIS — Z9181 History of falling: Secondary | ICD-10-CM | POA: Diagnosis not present

## 2016-04-24 DIAGNOSIS — Z7984 Long term (current) use of oral hypoglycemic drugs: Secondary | ICD-10-CM | POA: Diagnosis not present

## 2016-04-28 DIAGNOSIS — S7292XA Unspecified fracture of left femur, initial encounter for closed fracture: Secondary | ICD-10-CM | POA: Diagnosis not present

## 2016-04-29 DIAGNOSIS — S92415D Nondisplaced fracture of proximal phalanx of left great toe, subsequent encounter for fracture with routine healing: Secondary | ICD-10-CM | POA: Diagnosis not present

## 2016-04-29 DIAGNOSIS — I10 Essential (primary) hypertension: Secondary | ICD-10-CM | POA: Diagnosis not present

## 2016-04-29 DIAGNOSIS — I700 Atherosclerosis of aorta: Secondary | ICD-10-CM | POA: Diagnosis not present

## 2016-04-29 DIAGNOSIS — S72452D Displaced supracondylar fracture without intracondylar extension of lower end of left femur, subsequent encounter for closed fracture with routine healing: Secondary | ICD-10-CM | POA: Diagnosis not present

## 2016-04-29 DIAGNOSIS — M810 Age-related osteoporosis without current pathological fracture: Secondary | ICD-10-CM | POA: Diagnosis not present

## 2016-04-29 DIAGNOSIS — Z7984 Long term (current) use of oral hypoglycemic drugs: Secondary | ICD-10-CM | POA: Diagnosis not present

## 2016-04-29 DIAGNOSIS — E119 Type 2 diabetes mellitus without complications: Secondary | ICD-10-CM | POA: Diagnosis not present

## 2016-04-29 DIAGNOSIS — Z9181 History of falling: Secondary | ICD-10-CM | POA: Diagnosis not present

## 2016-04-29 DIAGNOSIS — M1711 Unilateral primary osteoarthritis, right knee: Secondary | ICD-10-CM | POA: Diagnosis not present

## 2016-04-29 DIAGNOSIS — F329 Major depressive disorder, single episode, unspecified: Secondary | ICD-10-CM | POA: Diagnosis not present

## 2016-04-29 DIAGNOSIS — Z7901 Long term (current) use of anticoagulants: Secondary | ICD-10-CM | POA: Diagnosis not present

## 2016-04-29 DIAGNOSIS — R32 Unspecified urinary incontinence: Secondary | ICD-10-CM | POA: Diagnosis not present

## 2016-05-01 DIAGNOSIS — E119 Type 2 diabetes mellitus without complications: Secondary | ICD-10-CM | POA: Diagnosis not present

## 2016-05-01 DIAGNOSIS — Z7901 Long term (current) use of anticoagulants: Secondary | ICD-10-CM | POA: Diagnosis not present

## 2016-05-01 DIAGNOSIS — Z7984 Long term (current) use of oral hypoglycemic drugs: Secondary | ICD-10-CM | POA: Diagnosis not present

## 2016-05-01 DIAGNOSIS — F329 Major depressive disorder, single episode, unspecified: Secondary | ICD-10-CM | POA: Diagnosis not present

## 2016-05-01 DIAGNOSIS — I10 Essential (primary) hypertension: Secondary | ICD-10-CM | POA: Diagnosis not present

## 2016-05-01 DIAGNOSIS — M1711 Unilateral primary osteoarthritis, right knee: Secondary | ICD-10-CM | POA: Diagnosis not present

## 2016-05-01 DIAGNOSIS — I700 Atherosclerosis of aorta: Secondary | ICD-10-CM | POA: Diagnosis not present

## 2016-05-01 DIAGNOSIS — M810 Age-related osteoporosis without current pathological fracture: Secondary | ICD-10-CM | POA: Diagnosis not present

## 2016-05-01 DIAGNOSIS — Z9181 History of falling: Secondary | ICD-10-CM | POA: Diagnosis not present

## 2016-05-01 DIAGNOSIS — R32 Unspecified urinary incontinence: Secondary | ICD-10-CM | POA: Diagnosis not present

## 2016-05-01 DIAGNOSIS — S72452D Displaced supracondylar fracture without intracondylar extension of lower end of left femur, subsequent encounter for closed fracture with routine healing: Secondary | ICD-10-CM | POA: Diagnosis not present

## 2016-05-01 DIAGNOSIS — S92415D Nondisplaced fracture of proximal phalanx of left great toe, subsequent encounter for fracture with routine healing: Secondary | ICD-10-CM | POA: Diagnosis not present

## 2016-05-06 ENCOUNTER — Other Ambulatory Visit (HOSPITAL_COMMUNITY)
Admission: AD | Admit: 2016-05-06 | Discharge: 2016-05-06 | Disposition: A | Discharge status: Home / Self Care | Payer: BLUE CROSS/BLUE SHIELD | Source: Skilled Nursing Facility

## 2016-05-06 DIAGNOSIS — S72452D Displaced supracondylar fracture without intracondylar extension of lower end of left femur, subsequent encounter for closed fracture with routine healing: Secondary | ICD-10-CM | POA: Diagnosis not present

## 2016-05-06 DIAGNOSIS — Z7984 Long term (current) use of oral hypoglycemic drugs: Secondary | ICD-10-CM | POA: Diagnosis not present

## 2016-05-06 DIAGNOSIS — N390 Urinary tract infection, site not specified: Secondary | ICD-10-CM | POA: Insufficient documentation

## 2016-05-06 DIAGNOSIS — S92415D Nondisplaced fracture of proximal phalanx of left great toe, subsequent encounter for fracture with routine healing: Secondary | ICD-10-CM | POA: Diagnosis not present

## 2016-05-06 DIAGNOSIS — E119 Type 2 diabetes mellitus without complications: Secondary | ICD-10-CM | POA: Diagnosis not present

## 2016-05-06 DIAGNOSIS — I700 Atherosclerosis of aorta: Secondary | ICD-10-CM | POA: Diagnosis not present

## 2016-05-06 DIAGNOSIS — F329 Major depressive disorder, single episode, unspecified: Secondary | ICD-10-CM | POA: Diagnosis not present

## 2016-05-06 DIAGNOSIS — I10 Essential (primary) hypertension: Secondary | ICD-10-CM | POA: Diagnosis not present

## 2016-05-06 DIAGNOSIS — R32 Unspecified urinary incontinence: Secondary | ICD-10-CM | POA: Diagnosis not present

## 2016-05-06 DIAGNOSIS — Z9181 History of falling: Secondary | ICD-10-CM | POA: Diagnosis not present

## 2016-05-06 DIAGNOSIS — M1711 Unilateral primary osteoarthritis, right knee: Secondary | ICD-10-CM | POA: Diagnosis not present

## 2016-05-06 DIAGNOSIS — Z7901 Long term (current) use of anticoagulants: Secondary | ICD-10-CM | POA: Diagnosis not present

## 2016-05-06 DIAGNOSIS — M810 Age-related osteoporosis without current pathological fracture: Secondary | ICD-10-CM | POA: Diagnosis not present

## 2016-05-06 LAB — URINALYSIS, ROUTINE W REFLEX MICROSCOPIC
BILIRUBIN URINE: NEGATIVE
GLUCOSE, UA: NEGATIVE mg/dL
NITRITE: POSITIVE — AB
PH: 5 (ref 5.0–8.0)
Protein, ur: 30 mg/dL — AB

## 2016-05-06 LAB — URINE MICROSCOPIC-ADD ON

## 2016-05-08 LAB — URINE CULTURE

## 2016-05-09 DIAGNOSIS — Z7984 Long term (current) use of oral hypoglycemic drugs: Secondary | ICD-10-CM | POA: Diagnosis not present

## 2016-05-09 DIAGNOSIS — I10 Essential (primary) hypertension: Secondary | ICD-10-CM | POA: Diagnosis not present

## 2016-05-09 DIAGNOSIS — M1711 Unilateral primary osteoarthritis, right knee: Secondary | ICD-10-CM | POA: Diagnosis not present

## 2016-05-09 DIAGNOSIS — F329 Major depressive disorder, single episode, unspecified: Secondary | ICD-10-CM | POA: Diagnosis not present

## 2016-05-09 DIAGNOSIS — S72452D Displaced supracondylar fracture without intracondylar extension of lower end of left femur, subsequent encounter for closed fracture with routine healing: Secondary | ICD-10-CM | POA: Diagnosis not present

## 2016-05-09 DIAGNOSIS — R32 Unspecified urinary incontinence: Secondary | ICD-10-CM | POA: Diagnosis not present

## 2016-05-09 DIAGNOSIS — Z7901 Long term (current) use of anticoagulants: Secondary | ICD-10-CM | POA: Diagnosis not present

## 2016-05-09 DIAGNOSIS — M810 Age-related osteoporosis without current pathological fracture: Secondary | ICD-10-CM | POA: Diagnosis not present

## 2016-05-09 DIAGNOSIS — S92415D Nondisplaced fracture of proximal phalanx of left great toe, subsequent encounter for fracture with routine healing: Secondary | ICD-10-CM | POA: Diagnosis not present

## 2016-05-09 DIAGNOSIS — E119 Type 2 diabetes mellitus without complications: Secondary | ICD-10-CM | POA: Diagnosis not present

## 2016-05-09 DIAGNOSIS — Z9181 History of falling: Secondary | ICD-10-CM | POA: Diagnosis not present

## 2016-05-09 DIAGNOSIS — I700 Atherosclerosis of aorta: Secondary | ICD-10-CM | POA: Diagnosis not present

## 2016-05-13 DIAGNOSIS — I700 Atherosclerosis of aorta: Secondary | ICD-10-CM | POA: Diagnosis not present

## 2016-05-13 DIAGNOSIS — E119 Type 2 diabetes mellitus without complications: Secondary | ICD-10-CM | POA: Diagnosis not present

## 2016-05-13 DIAGNOSIS — R32 Unspecified urinary incontinence: Secondary | ICD-10-CM | POA: Diagnosis not present

## 2016-05-13 DIAGNOSIS — M1711 Unilateral primary osteoarthritis, right knee: Secondary | ICD-10-CM | POA: Diagnosis not present

## 2016-05-13 DIAGNOSIS — Z9181 History of falling: Secondary | ICD-10-CM | POA: Diagnosis not present

## 2016-05-13 DIAGNOSIS — M810 Age-related osteoporosis without current pathological fracture: Secondary | ICD-10-CM | POA: Diagnosis not present

## 2016-05-13 DIAGNOSIS — S92415D Nondisplaced fracture of proximal phalanx of left great toe, subsequent encounter for fracture with routine healing: Secondary | ICD-10-CM | POA: Diagnosis not present

## 2016-05-13 DIAGNOSIS — I10 Essential (primary) hypertension: Secondary | ICD-10-CM | POA: Diagnosis not present

## 2016-05-13 DIAGNOSIS — Z7984 Long term (current) use of oral hypoglycemic drugs: Secondary | ICD-10-CM | POA: Diagnosis not present

## 2016-05-13 DIAGNOSIS — F329 Major depressive disorder, single episode, unspecified: Secondary | ICD-10-CM | POA: Diagnosis not present

## 2016-05-13 DIAGNOSIS — S72452D Displaced supracondylar fracture without intracondylar extension of lower end of left femur, subsequent encounter for closed fracture with routine healing: Secondary | ICD-10-CM | POA: Diagnosis not present

## 2016-05-13 DIAGNOSIS — Z7901 Long term (current) use of anticoagulants: Secondary | ICD-10-CM | POA: Diagnosis not present

## 2016-05-15 DIAGNOSIS — F329 Major depressive disorder, single episode, unspecified: Secondary | ICD-10-CM | POA: Diagnosis not present

## 2016-05-15 DIAGNOSIS — Z9181 History of falling: Secondary | ICD-10-CM | POA: Diagnosis not present

## 2016-05-15 DIAGNOSIS — I10 Essential (primary) hypertension: Secondary | ICD-10-CM | POA: Diagnosis not present

## 2016-05-15 DIAGNOSIS — Z7984 Long term (current) use of oral hypoglycemic drugs: Secondary | ICD-10-CM | POA: Diagnosis not present

## 2016-05-15 DIAGNOSIS — I700 Atherosclerosis of aorta: Secondary | ICD-10-CM | POA: Diagnosis not present

## 2016-05-15 DIAGNOSIS — S72452D Displaced supracondylar fracture without intracondylar extension of lower end of left femur, subsequent encounter for closed fracture with routine healing: Secondary | ICD-10-CM | POA: Diagnosis not present

## 2016-05-15 DIAGNOSIS — M810 Age-related osteoporosis without current pathological fracture: Secondary | ICD-10-CM | POA: Diagnosis not present

## 2016-05-15 DIAGNOSIS — M1711 Unilateral primary osteoarthritis, right knee: Secondary | ICD-10-CM | POA: Diagnosis not present

## 2016-05-15 DIAGNOSIS — S92415D Nondisplaced fracture of proximal phalanx of left great toe, subsequent encounter for fracture with routine healing: Secondary | ICD-10-CM | POA: Diagnosis not present

## 2016-05-15 DIAGNOSIS — R32 Unspecified urinary incontinence: Secondary | ICD-10-CM | POA: Diagnosis not present

## 2016-05-15 DIAGNOSIS — E119 Type 2 diabetes mellitus without complications: Secondary | ICD-10-CM | POA: Diagnosis not present

## 2016-05-15 DIAGNOSIS — Z7901 Long term (current) use of anticoagulants: Secondary | ICD-10-CM | POA: Diagnosis not present

## 2016-05-20 DIAGNOSIS — S92415D Nondisplaced fracture of proximal phalanx of left great toe, subsequent encounter for fracture with routine healing: Secondary | ICD-10-CM | POA: Diagnosis not present

## 2016-05-20 DIAGNOSIS — Z9181 History of falling: Secondary | ICD-10-CM | POA: Diagnosis not present

## 2016-05-20 DIAGNOSIS — I700 Atherosclerosis of aorta: Secondary | ICD-10-CM | POA: Diagnosis not present

## 2016-05-20 DIAGNOSIS — I10 Essential (primary) hypertension: Secondary | ICD-10-CM | POA: Diagnosis not present

## 2016-05-20 DIAGNOSIS — F329 Major depressive disorder, single episode, unspecified: Secondary | ICD-10-CM | POA: Diagnosis not present

## 2016-05-20 DIAGNOSIS — Z7984 Long term (current) use of oral hypoglycemic drugs: Secondary | ICD-10-CM | POA: Diagnosis not present

## 2016-05-20 DIAGNOSIS — S72452D Displaced supracondylar fracture without intracondylar extension of lower end of left femur, subsequent encounter for closed fracture with routine healing: Secondary | ICD-10-CM | POA: Diagnosis not present

## 2016-05-20 DIAGNOSIS — Z7901 Long term (current) use of anticoagulants: Secondary | ICD-10-CM | POA: Diagnosis not present

## 2016-05-20 DIAGNOSIS — M1711 Unilateral primary osteoarthritis, right knee: Secondary | ICD-10-CM | POA: Diagnosis not present

## 2016-05-20 DIAGNOSIS — E119 Type 2 diabetes mellitus without complications: Secondary | ICD-10-CM | POA: Diagnosis not present

## 2016-05-20 DIAGNOSIS — M810 Age-related osteoporosis without current pathological fracture: Secondary | ICD-10-CM | POA: Diagnosis not present

## 2016-05-20 DIAGNOSIS — R32 Unspecified urinary incontinence: Secondary | ICD-10-CM | POA: Diagnosis not present

## 2016-05-28 DIAGNOSIS — S7292XA Unspecified fracture of left femur, initial encounter for closed fracture: Secondary | ICD-10-CM | POA: Diagnosis not present

## 2016-05-29 ENCOUNTER — Ambulatory Visit (INDEPENDENT_AMBULATORY_CARE_PROVIDER_SITE_OTHER): Payer: BLUE CROSS/BLUE SHIELD | Admitting: Orthopaedic Surgery

## 2016-05-29 DIAGNOSIS — S72452D Displaced supracondylar fracture without intracondylar extension of lower end of left femur, subsequent encounter for closed fracture with routine healing: Secondary | ICD-10-CM | POA: Diagnosis not present

## 2016-06-02 DIAGNOSIS — Z7901 Long term (current) use of anticoagulants: Secondary | ICD-10-CM | POA: Diagnosis not present

## 2016-06-02 DIAGNOSIS — E119 Type 2 diabetes mellitus without complications: Secondary | ICD-10-CM | POA: Diagnosis not present

## 2016-06-02 DIAGNOSIS — S72452D Displaced supracondylar fracture without intracondylar extension of lower end of left femur, subsequent encounter for closed fracture with routine healing: Secondary | ICD-10-CM | POA: Diagnosis not present

## 2016-06-02 DIAGNOSIS — Z7984 Long term (current) use of oral hypoglycemic drugs: Secondary | ICD-10-CM | POA: Diagnosis not present

## 2016-06-02 DIAGNOSIS — S92415D Nondisplaced fracture of proximal phalanx of left great toe, subsequent encounter for fracture with routine healing: Secondary | ICD-10-CM | POA: Diagnosis not present

## 2016-06-02 DIAGNOSIS — M810 Age-related osteoporosis without current pathological fracture: Secondary | ICD-10-CM | POA: Diagnosis not present

## 2016-06-02 DIAGNOSIS — M1711 Unilateral primary osteoarthritis, right knee: Secondary | ICD-10-CM | POA: Diagnosis not present

## 2016-06-02 DIAGNOSIS — Z9181 History of falling: Secondary | ICD-10-CM | POA: Diagnosis not present

## 2016-06-02 DIAGNOSIS — R32 Unspecified urinary incontinence: Secondary | ICD-10-CM | POA: Diagnosis not present

## 2016-06-02 DIAGNOSIS — I10 Essential (primary) hypertension: Secondary | ICD-10-CM | POA: Diagnosis not present

## 2016-06-02 DIAGNOSIS — I700 Atherosclerosis of aorta: Secondary | ICD-10-CM | POA: Diagnosis not present

## 2016-06-02 DIAGNOSIS — F329 Major depressive disorder, single episode, unspecified: Secondary | ICD-10-CM | POA: Diagnosis not present

## 2016-06-04 DIAGNOSIS — Z7984 Long term (current) use of oral hypoglycemic drugs: Secondary | ICD-10-CM | POA: Diagnosis not present

## 2016-06-04 DIAGNOSIS — M1711 Unilateral primary osteoarthritis, right knee: Secondary | ICD-10-CM | POA: Diagnosis not present

## 2016-06-04 DIAGNOSIS — F329 Major depressive disorder, single episode, unspecified: Secondary | ICD-10-CM | POA: Diagnosis not present

## 2016-06-04 DIAGNOSIS — Z9181 History of falling: Secondary | ICD-10-CM | POA: Diagnosis not present

## 2016-06-04 DIAGNOSIS — S72452D Displaced supracondylar fracture without intracondylar extension of lower end of left femur, subsequent encounter for closed fracture with routine healing: Secondary | ICD-10-CM | POA: Diagnosis not present

## 2016-06-04 DIAGNOSIS — R32 Unspecified urinary incontinence: Secondary | ICD-10-CM | POA: Diagnosis not present

## 2016-06-04 DIAGNOSIS — E119 Type 2 diabetes mellitus without complications: Secondary | ICD-10-CM | POA: Diagnosis not present

## 2016-06-04 DIAGNOSIS — S92415D Nondisplaced fracture of proximal phalanx of left great toe, subsequent encounter for fracture with routine healing: Secondary | ICD-10-CM | POA: Diagnosis not present

## 2016-06-04 DIAGNOSIS — Z7901 Long term (current) use of anticoagulants: Secondary | ICD-10-CM | POA: Diagnosis not present

## 2016-06-04 DIAGNOSIS — I700 Atherosclerosis of aorta: Secondary | ICD-10-CM | POA: Diagnosis not present

## 2016-06-04 DIAGNOSIS — M810 Age-related osteoporosis without current pathological fracture: Secondary | ICD-10-CM | POA: Diagnosis not present

## 2016-06-04 DIAGNOSIS — I10 Essential (primary) hypertension: Secondary | ICD-10-CM | POA: Diagnosis not present

## 2016-06-11 DIAGNOSIS — I700 Atherosclerosis of aorta: Secondary | ICD-10-CM | POA: Diagnosis not present

## 2016-06-11 DIAGNOSIS — Z7901 Long term (current) use of anticoagulants: Secondary | ICD-10-CM | POA: Diagnosis not present

## 2016-06-11 DIAGNOSIS — F329 Major depressive disorder, single episode, unspecified: Secondary | ICD-10-CM | POA: Diagnosis not present

## 2016-06-11 DIAGNOSIS — M1711 Unilateral primary osteoarthritis, right knee: Secondary | ICD-10-CM | POA: Diagnosis not present

## 2016-06-11 DIAGNOSIS — M810 Age-related osteoporosis without current pathological fracture: Secondary | ICD-10-CM | POA: Diagnosis not present

## 2016-06-11 DIAGNOSIS — Z7984 Long term (current) use of oral hypoglycemic drugs: Secondary | ICD-10-CM | POA: Diagnosis not present

## 2016-06-11 DIAGNOSIS — Z9181 History of falling: Secondary | ICD-10-CM | POA: Diagnosis not present

## 2016-06-11 DIAGNOSIS — I10 Essential (primary) hypertension: Secondary | ICD-10-CM | POA: Diagnosis not present

## 2016-06-11 DIAGNOSIS — S72452D Displaced supracondylar fracture without intracondylar extension of lower end of left femur, subsequent encounter for closed fracture with routine healing: Secondary | ICD-10-CM | POA: Diagnosis not present

## 2016-06-11 DIAGNOSIS — S92415D Nondisplaced fracture of proximal phalanx of left great toe, subsequent encounter for fracture with routine healing: Secondary | ICD-10-CM | POA: Diagnosis not present

## 2016-06-11 DIAGNOSIS — E119 Type 2 diabetes mellitus without complications: Secondary | ICD-10-CM | POA: Diagnosis not present

## 2016-06-11 DIAGNOSIS — R32 Unspecified urinary incontinence: Secondary | ICD-10-CM | POA: Diagnosis not present

## 2016-06-13 DIAGNOSIS — F329 Major depressive disorder, single episode, unspecified: Secondary | ICD-10-CM | POA: Diagnosis not present

## 2016-06-13 DIAGNOSIS — S72452D Displaced supracondylar fracture without intracondylar extension of lower end of left femur, subsequent encounter for closed fracture with routine healing: Secondary | ICD-10-CM | POA: Diagnosis not present

## 2016-06-13 DIAGNOSIS — S92415D Nondisplaced fracture of proximal phalanx of left great toe, subsequent encounter for fracture with routine healing: Secondary | ICD-10-CM | POA: Diagnosis not present

## 2016-06-13 DIAGNOSIS — I700 Atherosclerosis of aorta: Secondary | ICD-10-CM | POA: Diagnosis not present

## 2016-06-13 DIAGNOSIS — R32 Unspecified urinary incontinence: Secondary | ICD-10-CM | POA: Diagnosis not present

## 2016-06-13 DIAGNOSIS — I10 Essential (primary) hypertension: Secondary | ICD-10-CM | POA: Diagnosis not present

## 2016-06-13 DIAGNOSIS — Z7984 Long term (current) use of oral hypoglycemic drugs: Secondary | ICD-10-CM | POA: Diagnosis not present

## 2016-06-13 DIAGNOSIS — Z9181 History of falling: Secondary | ICD-10-CM | POA: Diagnosis not present

## 2016-06-13 DIAGNOSIS — E119 Type 2 diabetes mellitus without complications: Secondary | ICD-10-CM | POA: Diagnosis not present

## 2016-06-13 DIAGNOSIS — M1711 Unilateral primary osteoarthritis, right knee: Secondary | ICD-10-CM | POA: Diagnosis not present

## 2016-06-13 DIAGNOSIS — M810 Age-related osteoporosis without current pathological fracture: Secondary | ICD-10-CM | POA: Diagnosis not present

## 2016-06-13 DIAGNOSIS — Z7901 Long term (current) use of anticoagulants: Secondary | ICD-10-CM | POA: Diagnosis not present

## 2016-06-17 DIAGNOSIS — I10 Essential (primary) hypertension: Secondary | ICD-10-CM | POA: Diagnosis not present

## 2016-06-17 DIAGNOSIS — S92415D Nondisplaced fracture of proximal phalanx of left great toe, subsequent encounter for fracture with routine healing: Secondary | ICD-10-CM | POA: Diagnosis not present

## 2016-06-17 DIAGNOSIS — Z7901 Long term (current) use of anticoagulants: Secondary | ICD-10-CM | POA: Diagnosis not present

## 2016-06-17 DIAGNOSIS — R32 Unspecified urinary incontinence: Secondary | ICD-10-CM | POA: Diagnosis not present

## 2016-06-17 DIAGNOSIS — I700 Atherosclerosis of aorta: Secondary | ICD-10-CM | POA: Diagnosis not present

## 2016-06-17 DIAGNOSIS — M1711 Unilateral primary osteoarthritis, right knee: Secondary | ICD-10-CM | POA: Diagnosis not present

## 2016-06-17 DIAGNOSIS — M810 Age-related osteoporosis without current pathological fracture: Secondary | ICD-10-CM | POA: Diagnosis not present

## 2016-06-17 DIAGNOSIS — Z7984 Long term (current) use of oral hypoglycemic drugs: Secondary | ICD-10-CM | POA: Diagnosis not present

## 2016-06-17 DIAGNOSIS — S72452D Displaced supracondylar fracture without intracondylar extension of lower end of left femur, subsequent encounter for closed fracture with routine healing: Secondary | ICD-10-CM | POA: Diagnosis not present

## 2016-06-17 DIAGNOSIS — F329 Major depressive disorder, single episode, unspecified: Secondary | ICD-10-CM | POA: Diagnosis not present

## 2016-06-17 DIAGNOSIS — E119 Type 2 diabetes mellitus without complications: Secondary | ICD-10-CM | POA: Diagnosis not present

## 2016-06-17 DIAGNOSIS — Z9181 History of falling: Secondary | ICD-10-CM | POA: Diagnosis not present

## 2016-06-19 DIAGNOSIS — Z7984 Long term (current) use of oral hypoglycemic drugs: Secondary | ICD-10-CM | POA: Diagnosis not present

## 2016-06-19 DIAGNOSIS — Z7901 Long term (current) use of anticoagulants: Secondary | ICD-10-CM | POA: Diagnosis not present

## 2016-06-19 DIAGNOSIS — I700 Atherosclerosis of aorta: Secondary | ICD-10-CM | POA: Diagnosis not present

## 2016-06-19 DIAGNOSIS — R32 Unspecified urinary incontinence: Secondary | ICD-10-CM | POA: Diagnosis not present

## 2016-06-19 DIAGNOSIS — S72452D Displaced supracondylar fracture without intracondylar extension of lower end of left femur, subsequent encounter for closed fracture with routine healing: Secondary | ICD-10-CM | POA: Diagnosis not present

## 2016-06-19 DIAGNOSIS — I10 Essential (primary) hypertension: Secondary | ICD-10-CM | POA: Diagnosis not present

## 2016-06-19 DIAGNOSIS — M810 Age-related osteoporosis without current pathological fracture: Secondary | ICD-10-CM | POA: Diagnosis not present

## 2016-06-19 DIAGNOSIS — F329 Major depressive disorder, single episode, unspecified: Secondary | ICD-10-CM | POA: Diagnosis not present

## 2016-06-19 DIAGNOSIS — M1711 Unilateral primary osteoarthritis, right knee: Secondary | ICD-10-CM | POA: Diagnosis not present

## 2016-06-19 DIAGNOSIS — E119 Type 2 diabetes mellitus without complications: Secondary | ICD-10-CM | POA: Diagnosis not present

## 2016-06-19 DIAGNOSIS — S92415D Nondisplaced fracture of proximal phalanx of left great toe, subsequent encounter for fracture with routine healing: Secondary | ICD-10-CM | POA: Diagnosis not present

## 2016-06-19 DIAGNOSIS — Z9181 History of falling: Secondary | ICD-10-CM | POA: Diagnosis not present

## 2016-06-23 DIAGNOSIS — I700 Atherosclerosis of aorta: Secondary | ICD-10-CM | POA: Diagnosis not present

## 2016-06-23 DIAGNOSIS — E119 Type 2 diabetes mellitus without complications: Secondary | ICD-10-CM | POA: Diagnosis not present

## 2016-06-23 DIAGNOSIS — Z9181 History of falling: Secondary | ICD-10-CM | POA: Diagnosis not present

## 2016-06-23 DIAGNOSIS — M810 Age-related osteoporosis without current pathological fracture: Secondary | ICD-10-CM | POA: Diagnosis not present

## 2016-06-23 DIAGNOSIS — I10 Essential (primary) hypertension: Secondary | ICD-10-CM | POA: Diagnosis not present

## 2016-06-23 DIAGNOSIS — F329 Major depressive disorder, single episode, unspecified: Secondary | ICD-10-CM | POA: Diagnosis not present

## 2016-06-23 DIAGNOSIS — M1711 Unilateral primary osteoarthritis, right knee: Secondary | ICD-10-CM | POA: Diagnosis not present

## 2016-06-23 DIAGNOSIS — Z7984 Long term (current) use of oral hypoglycemic drugs: Secondary | ICD-10-CM | POA: Diagnosis not present

## 2016-06-23 DIAGNOSIS — Z7901 Long term (current) use of anticoagulants: Secondary | ICD-10-CM | POA: Diagnosis not present

## 2016-06-23 DIAGNOSIS — R32 Unspecified urinary incontinence: Secondary | ICD-10-CM | POA: Diagnosis not present

## 2016-06-23 DIAGNOSIS — S72452D Displaced supracondylar fracture without intracondylar extension of lower end of left femur, subsequent encounter for closed fracture with routine healing: Secondary | ICD-10-CM | POA: Diagnosis not present

## 2016-06-23 DIAGNOSIS — S92415D Nondisplaced fracture of proximal phalanx of left great toe, subsequent encounter for fracture with routine healing: Secondary | ICD-10-CM | POA: Diagnosis not present

## 2016-06-26 DIAGNOSIS — E119 Type 2 diabetes mellitus without complications: Secondary | ICD-10-CM | POA: Diagnosis not present

## 2016-06-26 DIAGNOSIS — I700 Atherosclerosis of aorta: Secondary | ICD-10-CM | POA: Diagnosis not present

## 2016-06-26 DIAGNOSIS — I10 Essential (primary) hypertension: Secondary | ICD-10-CM | POA: Diagnosis not present

## 2016-06-26 DIAGNOSIS — Z7901 Long term (current) use of anticoagulants: Secondary | ICD-10-CM | POA: Diagnosis not present

## 2016-06-26 DIAGNOSIS — M1711 Unilateral primary osteoarthritis, right knee: Secondary | ICD-10-CM | POA: Diagnosis not present

## 2016-06-26 DIAGNOSIS — S72452D Displaced supracondylar fracture without intracondylar extension of lower end of left femur, subsequent encounter for closed fracture with routine healing: Secondary | ICD-10-CM | POA: Diagnosis not present

## 2016-06-26 DIAGNOSIS — M810 Age-related osteoporosis without current pathological fracture: Secondary | ICD-10-CM | POA: Diagnosis not present

## 2016-06-26 DIAGNOSIS — R32 Unspecified urinary incontinence: Secondary | ICD-10-CM | POA: Diagnosis not present

## 2016-06-26 DIAGNOSIS — S92415D Nondisplaced fracture of proximal phalanx of left great toe, subsequent encounter for fracture with routine healing: Secondary | ICD-10-CM | POA: Diagnosis not present

## 2016-06-26 DIAGNOSIS — F329 Major depressive disorder, single episode, unspecified: Secondary | ICD-10-CM | POA: Diagnosis not present

## 2016-06-26 DIAGNOSIS — Z7984 Long term (current) use of oral hypoglycemic drugs: Secondary | ICD-10-CM | POA: Diagnosis not present

## 2016-06-26 DIAGNOSIS — Z9181 History of falling: Secondary | ICD-10-CM | POA: Diagnosis not present

## 2016-06-28 DIAGNOSIS — S7292XA Unspecified fracture of left femur, initial encounter for closed fracture: Secondary | ICD-10-CM | POA: Diagnosis not present

## 2016-07-01 DIAGNOSIS — M1711 Unilateral primary osteoarthritis, right knee: Secondary | ICD-10-CM | POA: Diagnosis not present

## 2016-07-01 DIAGNOSIS — E119 Type 2 diabetes mellitus without complications: Secondary | ICD-10-CM | POA: Diagnosis not present

## 2016-07-01 DIAGNOSIS — Z7901 Long term (current) use of anticoagulants: Secondary | ICD-10-CM | POA: Diagnosis not present

## 2016-07-01 DIAGNOSIS — M810 Age-related osteoporosis without current pathological fracture: Secondary | ICD-10-CM | POA: Diagnosis not present

## 2016-07-01 DIAGNOSIS — R32 Unspecified urinary incontinence: Secondary | ICD-10-CM | POA: Diagnosis not present

## 2016-07-01 DIAGNOSIS — F329 Major depressive disorder, single episode, unspecified: Secondary | ICD-10-CM | POA: Diagnosis not present

## 2016-07-01 DIAGNOSIS — I700 Atherosclerosis of aorta: Secondary | ICD-10-CM | POA: Diagnosis not present

## 2016-07-01 DIAGNOSIS — S92415D Nondisplaced fracture of proximal phalanx of left great toe, subsequent encounter for fracture with routine healing: Secondary | ICD-10-CM | POA: Diagnosis not present

## 2016-07-01 DIAGNOSIS — I10 Essential (primary) hypertension: Secondary | ICD-10-CM | POA: Diagnosis not present

## 2016-07-01 DIAGNOSIS — S72452D Displaced supracondylar fracture without intracondylar extension of lower end of left femur, subsequent encounter for closed fracture with routine healing: Secondary | ICD-10-CM | POA: Diagnosis not present

## 2016-07-01 DIAGNOSIS — Z9181 History of falling: Secondary | ICD-10-CM | POA: Diagnosis not present

## 2016-07-01 DIAGNOSIS — Z7984 Long term (current) use of oral hypoglycemic drugs: Secondary | ICD-10-CM | POA: Diagnosis not present

## 2016-07-04 DIAGNOSIS — S72452D Displaced supracondylar fracture without intracondylar extension of lower end of left femur, subsequent encounter for closed fracture with routine healing: Secondary | ICD-10-CM | POA: Diagnosis not present

## 2016-07-04 DIAGNOSIS — R32 Unspecified urinary incontinence: Secondary | ICD-10-CM | POA: Diagnosis not present

## 2016-07-04 DIAGNOSIS — M810 Age-related osteoporosis without current pathological fracture: Secondary | ICD-10-CM | POA: Diagnosis not present

## 2016-07-04 DIAGNOSIS — E119 Type 2 diabetes mellitus without complications: Secondary | ICD-10-CM | POA: Diagnosis not present

## 2016-07-04 DIAGNOSIS — M1711 Unilateral primary osteoarthritis, right knee: Secondary | ICD-10-CM | POA: Diagnosis not present

## 2016-07-04 DIAGNOSIS — Z9181 History of falling: Secondary | ICD-10-CM | POA: Diagnosis not present

## 2016-07-04 DIAGNOSIS — Z7984 Long term (current) use of oral hypoglycemic drugs: Secondary | ICD-10-CM | POA: Diagnosis not present

## 2016-07-04 DIAGNOSIS — F329 Major depressive disorder, single episode, unspecified: Secondary | ICD-10-CM | POA: Diagnosis not present

## 2016-07-04 DIAGNOSIS — S92415D Nondisplaced fracture of proximal phalanx of left great toe, subsequent encounter for fracture with routine healing: Secondary | ICD-10-CM | POA: Diagnosis not present

## 2016-07-04 DIAGNOSIS — I10 Essential (primary) hypertension: Secondary | ICD-10-CM | POA: Diagnosis not present

## 2016-07-04 DIAGNOSIS — Z7901 Long term (current) use of anticoagulants: Secondary | ICD-10-CM | POA: Diagnosis not present

## 2016-07-04 DIAGNOSIS — I700 Atherosclerosis of aorta: Secondary | ICD-10-CM | POA: Diagnosis not present

## 2016-07-07 DIAGNOSIS — F329 Major depressive disorder, single episode, unspecified: Secondary | ICD-10-CM | POA: Diagnosis not present

## 2016-07-07 DIAGNOSIS — S72452D Displaced supracondylar fracture without intracondylar extension of lower end of left femur, subsequent encounter for closed fracture with routine healing: Secondary | ICD-10-CM | POA: Diagnosis not present

## 2016-07-07 DIAGNOSIS — Z7984 Long term (current) use of oral hypoglycemic drugs: Secondary | ICD-10-CM | POA: Diagnosis not present

## 2016-07-07 DIAGNOSIS — R32 Unspecified urinary incontinence: Secondary | ICD-10-CM | POA: Diagnosis not present

## 2016-07-07 DIAGNOSIS — E119 Type 2 diabetes mellitus without complications: Secondary | ICD-10-CM | POA: Diagnosis not present

## 2016-07-07 DIAGNOSIS — M810 Age-related osteoporosis without current pathological fracture: Secondary | ICD-10-CM | POA: Diagnosis not present

## 2016-07-07 DIAGNOSIS — S92415D Nondisplaced fracture of proximal phalanx of left great toe, subsequent encounter for fracture with routine healing: Secondary | ICD-10-CM | POA: Diagnosis not present

## 2016-07-07 DIAGNOSIS — I10 Essential (primary) hypertension: Secondary | ICD-10-CM | POA: Diagnosis not present

## 2016-07-07 DIAGNOSIS — Z7901 Long term (current) use of anticoagulants: Secondary | ICD-10-CM | POA: Diagnosis not present

## 2016-07-07 DIAGNOSIS — I700 Atherosclerosis of aorta: Secondary | ICD-10-CM | POA: Diagnosis not present

## 2016-07-07 DIAGNOSIS — M1711 Unilateral primary osteoarthritis, right knee: Secondary | ICD-10-CM | POA: Diagnosis not present

## 2016-07-07 DIAGNOSIS — Z9181 History of falling: Secondary | ICD-10-CM | POA: Diagnosis not present

## 2016-07-09 DIAGNOSIS — I700 Atherosclerosis of aorta: Secondary | ICD-10-CM | POA: Diagnosis not present

## 2016-07-09 DIAGNOSIS — I10 Essential (primary) hypertension: Secondary | ICD-10-CM | POA: Diagnosis not present

## 2016-07-09 DIAGNOSIS — E119 Type 2 diabetes mellitus without complications: Secondary | ICD-10-CM | POA: Diagnosis not present

## 2016-07-09 DIAGNOSIS — R32 Unspecified urinary incontinence: Secondary | ICD-10-CM | POA: Diagnosis not present

## 2016-07-09 DIAGNOSIS — Z9181 History of falling: Secondary | ICD-10-CM | POA: Diagnosis not present

## 2016-07-09 DIAGNOSIS — M810 Age-related osteoporosis without current pathological fracture: Secondary | ICD-10-CM | POA: Diagnosis not present

## 2016-07-09 DIAGNOSIS — S92415D Nondisplaced fracture of proximal phalanx of left great toe, subsequent encounter for fracture with routine healing: Secondary | ICD-10-CM | POA: Diagnosis not present

## 2016-07-09 DIAGNOSIS — S72452D Displaced supracondylar fracture without intracondylar extension of lower end of left femur, subsequent encounter for closed fracture with routine healing: Secondary | ICD-10-CM | POA: Diagnosis not present

## 2016-07-09 DIAGNOSIS — M1711 Unilateral primary osteoarthritis, right knee: Secondary | ICD-10-CM | POA: Diagnosis not present

## 2016-07-09 DIAGNOSIS — Z7901 Long term (current) use of anticoagulants: Secondary | ICD-10-CM | POA: Diagnosis not present

## 2016-07-09 DIAGNOSIS — F329 Major depressive disorder, single episode, unspecified: Secondary | ICD-10-CM | POA: Diagnosis not present

## 2016-07-09 DIAGNOSIS — Z7984 Long term (current) use of oral hypoglycemic drugs: Secondary | ICD-10-CM | POA: Diagnosis not present

## 2016-07-28 DIAGNOSIS — S7292XA Unspecified fracture of left femur, initial encounter for closed fracture: Secondary | ICD-10-CM | POA: Diagnosis not present

## 2016-08-06 DIAGNOSIS — E785 Hyperlipidemia, unspecified: Secondary | ICD-10-CM | POA: Diagnosis not present

## 2016-08-06 DIAGNOSIS — Z23 Encounter for immunization: Secondary | ICD-10-CM | POA: Diagnosis not present

## 2016-08-06 DIAGNOSIS — E669 Obesity, unspecified: Secondary | ICD-10-CM | POA: Diagnosis not present

## 2016-08-06 DIAGNOSIS — E1169 Type 2 diabetes mellitus with other specified complication: Secondary | ICD-10-CM | POA: Diagnosis not present

## 2016-08-06 DIAGNOSIS — I10 Essential (primary) hypertension: Secondary | ICD-10-CM | POA: Diagnosis not present

## 2016-08-28 DIAGNOSIS — S7292XA Unspecified fracture of left femur, initial encounter for closed fracture: Secondary | ICD-10-CM | POA: Diagnosis not present

## 2016-09-01 ENCOUNTER — Ambulatory Visit (INDEPENDENT_AMBULATORY_CARE_PROVIDER_SITE_OTHER): Payer: Medicare Other | Admitting: Orthopaedic Surgery

## 2016-09-04 ENCOUNTER — Ambulatory Visit (INDEPENDENT_AMBULATORY_CARE_PROVIDER_SITE_OTHER): Payer: Medicare Other | Admitting: Orthopaedic Surgery

## 2016-09-28 DIAGNOSIS — S7292XA Unspecified fracture of left femur, initial encounter for closed fracture: Secondary | ICD-10-CM | POA: Diagnosis not present

## 2016-10-11 DIAGNOSIS — A084 Viral intestinal infection, unspecified: Secondary | ICD-10-CM | POA: Diagnosis not present

## 2016-10-11 DIAGNOSIS — J209 Acute bronchitis, unspecified: Secondary | ICD-10-CM | POA: Diagnosis not present

## 2016-10-26 DIAGNOSIS — S7292XA Unspecified fracture of left femur, initial encounter for closed fracture: Secondary | ICD-10-CM | POA: Diagnosis not present

## 2016-11-26 DIAGNOSIS — S7292XA Unspecified fracture of left femur, initial encounter for closed fracture: Secondary | ICD-10-CM | POA: Diagnosis not present

## 2016-12-10 DIAGNOSIS — E1169 Type 2 diabetes mellitus with other specified complication: Secondary | ICD-10-CM | POA: Diagnosis not present

## 2016-12-10 DIAGNOSIS — M159 Polyosteoarthritis, unspecified: Secondary | ICD-10-CM | POA: Diagnosis not present

## 2016-12-10 DIAGNOSIS — I10 Essential (primary) hypertension: Secondary | ICD-10-CM | POA: Diagnosis not present

## 2016-12-10 DIAGNOSIS — F3342 Major depressive disorder, recurrent, in full remission: Secondary | ICD-10-CM | POA: Diagnosis not present

## 2016-12-26 DIAGNOSIS — S7292XA Unspecified fracture of left femur, initial encounter for closed fracture: Secondary | ICD-10-CM | POA: Diagnosis not present

## 2017-01-06 ENCOUNTER — Other Ambulatory Visit: Payer: Self-pay | Admitting: Internal Medicine

## 2017-01-06 DIAGNOSIS — R921 Mammographic calcification found on diagnostic imaging of breast: Secondary | ICD-10-CM

## 2017-01-13 ENCOUNTER — Other Ambulatory Visit: Payer: Medicare Other

## 2017-01-16 ENCOUNTER — Ambulatory Visit
Admission: RE | Admit: 2017-01-16 | Discharge: 2017-01-16 | Disposition: A | Discharge status: Home / Self Care | Payer: BLUE CROSS/BLUE SHIELD | Source: Ambulatory Visit | Attending: Internal Medicine | Admitting: Internal Medicine

## 2017-01-16 DIAGNOSIS — R928 Other abnormal and inconclusive findings on diagnostic imaging of breast: Secondary | ICD-10-CM | POA: Diagnosis not present

## 2017-01-16 DIAGNOSIS — R921 Mammographic calcification found on diagnostic imaging of breast: Secondary | ICD-10-CM

## 2017-02-13 ENCOUNTER — Telehealth (INDEPENDENT_AMBULATORY_CARE_PROVIDER_SITE_OTHER): Payer: Self-pay | Admitting: Orthopaedic Surgery

## 2017-02-13 NOTE — Telephone Encounter (Signed)
This ok? 

## 2017-02-13 NOTE — Telephone Encounter (Signed)
Patient called needing Rx for a handicap placard. The number to contact patient is [phone]

## 2017-02-13 NOTE — Telephone Encounter (Signed)
yes

## 2017-02-16 NOTE — Telephone Encounter (Signed)
LMOM for patient letting her know handicap paper is at front desk

## 2017-02-19 ENCOUNTER — Ambulatory Visit (INDEPENDENT_AMBULATORY_CARE_PROVIDER_SITE_OTHER): Payer: BLUE CROSS/BLUE SHIELD | Admitting: Orthopaedic Surgery

## 2017-02-19 ENCOUNTER — Ambulatory Visit (INDEPENDENT_AMBULATORY_CARE_PROVIDER_SITE_OTHER): Payer: Self-pay

## 2017-02-19 ENCOUNTER — Ambulatory Visit (INDEPENDENT_AMBULATORY_CARE_PROVIDER_SITE_OTHER): Payer: BLUE CROSS/BLUE SHIELD

## 2017-02-19 DIAGNOSIS — M25561 Pain in right knee: Secondary | ICD-10-CM

## 2017-02-19 DIAGNOSIS — M25562 Pain in left knee: Secondary | ICD-10-CM

## 2017-02-19 DIAGNOSIS — G8929 Other chronic pain: Secondary | ICD-10-CM

## 2017-02-19 MED ORDER — LIDOCAINE HCL 1 % IJ SOLN
2.0000 mL | INTRAMUSCULAR | Status: AC | PRN
  Administered 2017-02-19: 2 mL

## 2017-02-19 MED ORDER — METHYLPREDNISOLONE ACETATE 40 MG/ML IJ SUSP
40.0000 mg | INTRAMUSCULAR | Status: AC | PRN
  Administered 2017-02-19: 40 mg via INTRA_ARTICULAR

## 2017-02-19 MED ORDER — BUPIVACAINE HCL 0.5 % IJ SOLN
2.0000 mL | INTRAMUSCULAR | Status: AC | PRN
  Administered 2017-02-19: 2 mL via INTRA_ARTICULAR

## 2017-02-19 NOTE — Progress Notes (Signed)
Office Visit Note   Patient: [name]           Date of Birth: 01/24/1951           MRN: [id number] Visit Date: 02/19/2017              Requested by: Jamey Ripa [place], [place] [address] PCP: [facility]   Assessment & Plan: Visit Diagnoses:  1. Chronic pain of both knees     Plan: Patient has bilateral degenerative joint disease. Bilateral knee injections were performed. Questions encouraged and answered. Patient has poor protoplasm. Prescription for Rollator was provided to help improve ability to ambulate.  Follow-Up Instructions: Return if symptoms worsen or fail to improve.   Orders:  Orders Placed This Encounter  Procedures  . Large Joint Injection/Arthrocentesis  . Large Joint Injection/Arthrocentesis  . XR Knee 1-2 Views Left  . XR Knee 1-2 Views Right   No orders of the defined types were placed in this encounter.     Procedures: Large Joint Inj Date/Time: 02/19/2017 10:37 AM Performed by: Leandrew Koyanagi Authorized by: Leandrew Koyanagi   Consent Given by:  Patient Timeout: prior to procedure the correct patient, procedure, and site was verified   Indications:  Pain Location:  Knee Site:  R knee Prep: patient was prepped and draped in usual sterile fashion   Needle Size:  22 G Ultrasound Guidance: No   Fluoroscopic Guidance: No   Arthrogram: No   Patient tolerance:  Patient tolerated the procedure well with no immediate complications Large Joint Inj Date/Time: 02/19/2017 10:37 AM Performed by: Leandrew Koyanagi Authorized by: Leandrew Koyanagi   Consent Given by:  Patient Timeout: prior to procedure the correct patient, procedure, and site was verified   Indications:  Pain Location:  Knee Site:  L knee Prep: patient was prepped and draped in usual sterile fashion   Needle Size:  22 G Ultrasound Guidance: No   Fluoroscopic Guidance: No   Arthrogram: No   Medications:  2 mL lidocaine 1 %; 2 mL  bupivacaine 0.5 %; 40 mg methylPREDNISolone acetate 40 MG/ML Patient tolerance:  Patient tolerated the procedure well with no immediate complications     Clinical Data: No additional findings.   Subjective: No chief complaint on file.   [name] is a 66 year old female who I performed intramedullary fixation of a comminuted left supracondylar femur fracture about a year ago. Patient is a low demand patient who is a household ambulator with a walker at baseline. She comes in with chronic bilateral knee pain. Denies any constitutional symptoms. She has taken over-the-counter medicines    Review of Systems  Constitutional: Negative.   HENT: Negative.   Eyes: Negative.   Respiratory: Negative.   Cardiovascular: Negative.   Endocrine: Negative.   Musculoskeletal: Negative.   Neurological: Negative.   Hematological: Negative.   Psychiatric/Behavioral: Negative.   All other systems reviewed and are negative.    Objective: Vital Signs: There were no vitals taken for this visit.  Physical Exam  Constitutional: She is oriented to person, place, and time. She appears well-developed and well-nourished.  Pulmonary/Chest: Effort normal.  Neurological: She is alert and oriented to person, place, and time.  Skin: Skin is warm. Capillary refill takes less than 2 seconds.  Psychiatric: She has a normal mood and affect. Her behavior is normal. Judgment and thought content normal.  Nursing note and vitals reviewed.  Ortho Exam Bilateral knee exam shows no joint effusion. She has good range of motion overall. She lacks about 10 of full extension of the left knee. Specialty Comments:  No specialty comments available.  Imaging: Xr Knee 1-2 Views Left  Result Date: 02/19/2017 Advanced degenerative joint disease.  Healed supracondylar femur fracture.  Stable collapse of her intramedullary nail into the femoral notch  Xr Knee 1-2 Views Right  Result Date: 02/19/2017 Advanced  degenerative joint disease    PMFS History: Patient Active Problem List   Diagnosis Date Noted  . [place] 03/11/2016  . Muscle spasms of lower extremity 03/04/2016  . Acute renal insufficiency 02/26/2016  . History of MRSA infection 02/26/2016  . Diabetes type 2, uncontrolled ([facility]) 02/19/2016  . HTN (hypertension), benign 02/19/2016  . Fracture, femur, distal ([facility]) 02/19/2016  . Fall 02/19/2016  . Depression 02/19/2016  . Closed left femoral fracture, initial encounter 02/19/2016  . Femur fracture, left ([facility]) 02/19/2016   Past Medical History:  Diagnosis Date  . Depression   . Diabetes mellitus without complication ([facility])   . Hypertension   . Osteoporosis   . UTI (lower urinary tract infection) 01/2016   Cipro for Klebsiella pneumoniae isolate  . UTI (lower urinary tract infection) 02/19/2016   Klebsiella    Family History  Problem Relation Age of Onset  . Diabetes Mother   . Cancer Mother   . Stroke Mother   . Breast cancer Mother   . Diabetes Father   . Stroke Father   . Diabetes Brother   . Diabetes Maternal Grandmother   . Diabetes Maternal Grandfather   . Diabetes Paternal Grandmother   . Diabetes Paternal Grandfather   . Breast cancer Maternal Aunt     Past Surgical History:  Procedure Laterality Date  . ABDOMINAL HYSTERECTOMY    . CESAREAN SECTION    . CHOLECYSTECTOMY    . FEMUR IM NAIL Left 02/20/2016  . FEMUR IM NAIL Left 02/20/2016   Procedure: INTRAMEDULLARY (IM) RETROGRADE FEMORAL NAILING;  Surgeon: Leandrew Koyanagi, MD;  Location: [facility];  Service: Orthopedics;  Laterality: Left;  . PANCREAS SURGERY  1967   1 cyst excised and one cyst drained  . VEIN LIGATION AND STRIPPING    . WRIST FRACTURE SURGERY     Social History   Occupational History  . Not on file.   Social History Main Topics  . Smoking status: Never Smoker  . Smokeless tobacco: Never Used  . Alcohol use No  . Drug use: No  . Sexual activity: Not on file

## 2017-02-26 DIAGNOSIS — R300 Dysuria: Secondary | ICD-10-CM | POA: Diagnosis not present

## 2017-05-18 DIAGNOSIS — E1169 Type 2 diabetes mellitus with other specified complication: Secondary | ICD-10-CM | POA: Diagnosis not present

## 2017-05-18 DIAGNOSIS — Z23 Encounter for immunization: Secondary | ICD-10-CM | POA: Diagnosis not present

## 2017-05-18 DIAGNOSIS — N390 Urinary tract infection, site not specified: Secondary | ICD-10-CM | POA: Diagnosis not present

## 2017-05-18 DIAGNOSIS — E1165 Type 2 diabetes mellitus with hyperglycemia: Secondary | ICD-10-CM | POA: Diagnosis not present

## 2017-06-07 DIAGNOSIS — R300 Dysuria: Secondary | ICD-10-CM | POA: Diagnosis not present

## 2018-01-08 DIAGNOSIS — Z1389 Encounter for screening for other disorder: Secondary | ICD-10-CM | POA: Diagnosis not present

## 2018-01-08 DIAGNOSIS — Z23 Encounter for immunization: Secondary | ICD-10-CM | POA: Diagnosis not present

## 2018-01-08 DIAGNOSIS — E1121 Type 2 diabetes mellitus with diabetic nephropathy: Secondary | ICD-10-CM | POA: Diagnosis not present

## 2018-01-08 DIAGNOSIS — Z0000 Encounter for general adult medical examination without abnormal findings: Secondary | ICD-10-CM | POA: Diagnosis not present

## 2018-01-13 DIAGNOSIS — R799 Abnormal finding of blood chemistry, unspecified: Secondary | ICD-10-CM | POA: Diagnosis not present

## 2018-01-20 DIAGNOSIS — D509 Iron deficiency anemia, unspecified: Secondary | ICD-10-CM | POA: Diagnosis not present

## 2018-01-20 DIAGNOSIS — E875 Hyperkalemia: Secondary | ICD-10-CM | POA: Diagnosis not present

## 2018-01-20 DIAGNOSIS — E1165 Type 2 diabetes mellitus with hyperglycemia: Secondary | ICD-10-CM | POA: Diagnosis not present

## 2018-01-20 DIAGNOSIS — I10 Essential (primary) hypertension: Secondary | ICD-10-CM | POA: Diagnosis not present

## 2018-02-26 DIAGNOSIS — L309 Dermatitis, unspecified: Secondary | ICD-10-CM | POA: Diagnosis not present

## 2018-03-18 DIAGNOSIS — Z9289 Personal history of other medical treatment: Secondary | ICD-10-CM

## 2018-03-18 HISTORY — DX: Personal history of other medical treatment: Z92.89

## 2018-03-29 ENCOUNTER — Ambulatory Visit
Admission: RE | Admit: 2018-03-29 | Discharge: 2018-03-29 | Disposition: A | Discharge status: Home / Self Care | Payer: BLUE CROSS/BLUE SHIELD | Source: Ambulatory Visit | Attending: Internal Medicine | Admitting: Internal Medicine

## 2018-03-29 ENCOUNTER — Other Ambulatory Visit: Payer: Self-pay | Admitting: Internal Medicine

## 2018-03-29 DIAGNOSIS — R0609 Other forms of dyspnea: Secondary | ICD-10-CM | POA: Diagnosis not present

## 2018-03-29 DIAGNOSIS — I5031 Acute diastolic (congestive) heart failure: Secondary | ICD-10-CM

## 2018-03-29 DIAGNOSIS — L299 Pruritus, unspecified: Secondary | ICD-10-CM | POA: Diagnosis not present

## 2018-04-01 ENCOUNTER — Emergency Department (HOSPITAL_COMMUNITY): Payer: BLUE CROSS/BLUE SHIELD

## 2018-04-01 ENCOUNTER — Other Ambulatory Visit: Payer: Self-pay

## 2018-04-01 ENCOUNTER — Encounter (HOSPITAL_COMMUNITY): Payer: Self-pay

## 2018-04-01 ENCOUNTER — Inpatient Hospital Stay (HOSPITAL_COMMUNITY)
Admission: EM | Admit: 2018-04-01 | Discharge: 2018-04-08 | DRG: 291 | Disposition: A | Discharge status: Home Health | Payer: BLUE CROSS/BLUE SHIELD | Attending: Internal Medicine | Admitting: Internal Medicine

## 2018-04-01 DIAGNOSIS — N390 Urinary tract infection, site not specified: Secondary | ICD-10-CM | POA: Diagnosis present

## 2018-04-01 DIAGNOSIS — N183 Chronic kidney disease, stage 3 (moderate): Secondary | ICD-10-CM | POA: Diagnosis present

## 2018-04-01 DIAGNOSIS — I509 Heart failure, unspecified: Secondary | ICD-10-CM

## 2018-04-01 DIAGNOSIS — I5043 Acute on chronic combined systolic (congestive) and diastolic (congestive) heart failure: Secondary | ICD-10-CM | POA: Diagnosis present

## 2018-04-01 DIAGNOSIS — E875 Hyperkalemia: Secondary | ICD-10-CM | POA: Diagnosis not present

## 2018-04-01 DIAGNOSIS — Z91040 Latex allergy status: Secondary | ICD-10-CM

## 2018-04-01 DIAGNOSIS — M7989 Other specified soft tissue disorders: Secondary | ICD-10-CM | POA: Diagnosis present

## 2018-04-01 DIAGNOSIS — IMO0002 Reserved for concepts with insufficient information to code with codable children: Secondary | ICD-10-CM | POA: Diagnosis present

## 2018-04-01 DIAGNOSIS — I272 Pulmonary hypertension, unspecified: Secondary | ICD-10-CM | POA: Diagnosis present

## 2018-04-01 DIAGNOSIS — I10 Essential (primary) hypertension: Secondary | ICD-10-CM | POA: Diagnosis not present

## 2018-04-01 DIAGNOSIS — D631 Anemia in chronic kidney disease: Secondary | ICD-10-CM | POA: Diagnosis present

## 2018-04-01 DIAGNOSIS — F329 Major depressive disorder, single episode, unspecified: Secondary | ICD-10-CM | POA: Diagnosis not present

## 2018-04-01 DIAGNOSIS — T502X5A Adverse effect of carbonic-anhydrase inhibitors, benzothiadiazides and other diuretics, initial encounter: Secondary | ICD-10-CM | POA: Diagnosis not present

## 2018-04-01 DIAGNOSIS — T464X5A Adverse effect of angiotensin-converting-enzyme inhibitors, initial encounter: Secondary | ICD-10-CM | POA: Diagnosis not present

## 2018-04-01 DIAGNOSIS — I4891 Unspecified atrial fibrillation: Secondary | ICD-10-CM | POA: Diagnosis not present

## 2018-04-01 DIAGNOSIS — E1122 Type 2 diabetes mellitus with diabetic chronic kidney disease: Secondary | ICD-10-CM | POA: Diagnosis not present

## 2018-04-01 DIAGNOSIS — N179 Acute kidney failure, unspecified: Secondary | ICD-10-CM | POA: Diagnosis present

## 2018-04-01 DIAGNOSIS — I130 Hypertensive heart and chronic kidney disease with heart failure and stage 1 through stage 4 chronic kidney disease, or unspecified chronic kidney disease: Principal | ICD-10-CM | POA: Diagnosis present

## 2018-04-01 DIAGNOSIS — Z883 Allergy status to other anti-infective agents status: Secondary | ICD-10-CM

## 2018-04-01 DIAGNOSIS — N289 Disorder of kidney and ureter, unspecified: Secondary | ICD-10-CM

## 2018-04-01 DIAGNOSIS — Z6841 Body Mass Index (BMI) 40.0 and over, adult: Secondary | ICD-10-CM | POA: Diagnosis not present

## 2018-04-01 DIAGNOSIS — M810 Age-related osteoporosis without current pathological fracture: Secondary | ICD-10-CM | POA: Diagnosis not present

## 2018-04-01 DIAGNOSIS — G4733 Obstructive sleep apnea (adult) (pediatric): Secondary | ICD-10-CM | POA: Diagnosis present

## 2018-04-01 DIAGNOSIS — I340 Nonrheumatic mitral (valve) insufficiency: Secondary | ICD-10-CM | POA: Diagnosis not present

## 2018-04-01 DIAGNOSIS — I5031 Acute diastolic (congestive) heart failure: Secondary | ICD-10-CM | POA: Diagnosis not present

## 2018-04-01 DIAGNOSIS — E1165 Type 2 diabetes mellitus with hyperglycemia: Secondary | ICD-10-CM | POA: Diagnosis present

## 2018-04-01 DIAGNOSIS — I481 Persistent atrial fibrillation: Secondary | ICD-10-CM | POA: Diagnosis not present

## 2018-04-01 DIAGNOSIS — I513 Intracardiac thrombosis, not elsewhere classified: Secondary | ICD-10-CM | POA: Diagnosis present

## 2018-04-01 DIAGNOSIS — R002 Palpitations: Secondary | ICD-10-CM | POA: Diagnosis present

## 2018-04-01 DIAGNOSIS — Z7951 Long term (current) use of inhaled steroids: Secondary | ICD-10-CM

## 2018-04-01 DIAGNOSIS — R0602 Shortness of breath: Secondary | ICD-10-CM | POA: Diagnosis not present

## 2018-04-01 DIAGNOSIS — B961 Klebsiella pneumoniae [K. pneumoniae] as the cause of diseases classified elsewhere: Secondary | ICD-10-CM | POA: Diagnosis not present

## 2018-04-01 DIAGNOSIS — R0601 Orthopnea: Secondary | ICD-10-CM | POA: Diagnosis present

## 2018-04-01 DIAGNOSIS — Z7984 Long term (current) use of oral hypoglycemic drugs: Secondary | ICD-10-CM

## 2018-04-01 DIAGNOSIS — I361 Nonrheumatic tricuspid (valve) insufficiency: Secondary | ICD-10-CM | POA: Diagnosis not present

## 2018-04-01 DIAGNOSIS — Z79899 Other long term (current) drug therapy: Secondary | ICD-10-CM

## 2018-04-01 DIAGNOSIS — I081 Rheumatic disorders of both mitral and tricuspid valves: Secondary | ICD-10-CM | POA: Diagnosis not present

## 2018-04-01 DIAGNOSIS — I110 Hypertensive heart disease with heart failure: Secondary | ICD-10-CM | POA: Diagnosis not present

## 2018-04-01 HISTORY — DX: Morbid (severe) obesity due to excess calories: E66.01

## 2018-04-01 LAB — BASIC METABOLIC PANEL WITH GFR
Anion gap: 14 (ref 5–15)
BUN: 26 mg/dL — ABNORMAL HIGH (ref 8–23)
CO2: 20 mmol/L — ABNORMAL LOW (ref 22–32)
Calcium: 8.9 mg/dL (ref 8.9–10.3)
Chloride: 107 mmol/L (ref 98–111)
Creatinine, Ser: 1.86 mg/dL — ABNORMAL HIGH (ref 0.44–1.00)
GFR calc Af Amer: 31 mL/min — ABNORMAL LOW
GFR calc non Af Amer: 27 mL/min — ABNORMAL LOW
Glucose, Bld: 239 mg/dL — ABNORMAL HIGH (ref 70–99)
Potassium: 4.2 mmol/L (ref 3.5–5.1)
Sodium: 141 mmol/L (ref 135–145)

## 2018-04-01 LAB — CBC
HEMATOCRIT: 36 % (ref 36.0–46.0)
HEMOGLOBIN: 9.5 g/dL — AB (ref 12.0–15.0)
MCH: 26.8 pg (ref 26.0–34.0)
MCHC: 26.4 g/dL — ABNORMAL LOW (ref 30.0–36.0)
MCV: 101.4 fL — ABNORMAL HIGH (ref 78.0–100.0)
Platelets: 191 10*3/uL (ref 150–400)
RBC: 3.55 MIL/uL — AB (ref 3.87–5.11)
RDW: 15.4 % (ref 11.5–15.5)
WBC: 5.8 10*3/uL (ref 4.0–10.5)

## 2018-04-01 LAB — APTT: aPTT: 33 seconds (ref 24–36)

## 2018-04-01 LAB — GLUCOSE, CAPILLARY: Glucose-Capillary: 222 mg/dL — ABNORMAL HIGH (ref 70–99)

## 2018-04-01 LAB — I-STAT TROPONIN, ED: Troponin i, poc: 0.02 ng/mL (ref 0.00–0.08)

## 2018-04-01 MED ORDER — DILTIAZEM HCL 25 MG/5ML IV SOLN
10.0000 mg | Freq: Once | INTRAVENOUS | Status: AC
  Administered 2018-04-01: 10 mg via INTRAVENOUS
  Filled 2018-04-01: qty 5

## 2018-04-01 MED ORDER — HEPARIN BOLUS VIA INFUSION
4000.0000 [IU] | Freq: Once | INTRAVENOUS | Status: AC
  Administered 2018-04-01: 4000 [IU] via INTRAVENOUS
  Filled 2018-04-01: qty 4000

## 2018-04-01 MED ORDER — ONDANSETRON HCL 4 MG PO TABS
4.0000 mg | ORAL_TABLET | Freq: Four times a day (QID) | ORAL | Status: DC | PRN
  Administered 2018-04-05: 4 mg via ORAL
  Filled 2018-04-01: qty 1

## 2018-04-01 MED ORDER — ACETAMINOPHEN 325 MG PO TABS
650.0000 mg | ORAL_TABLET | Freq: Four times a day (QID) | ORAL | Status: DC | PRN
  Administered 2018-04-03 – 2018-04-07 (×3): 650 mg via ORAL
  Filled 2018-04-01 (×3): qty 2

## 2018-04-01 MED ORDER — INSULIN ASPART 100 UNIT/ML ~~LOC~~ SOLN
0.0000 [IU] | Freq: Three times a day (TID) | SUBCUTANEOUS | Status: DC
  Administered 2018-04-02: 2 [IU] via SUBCUTANEOUS
  Administered 2018-04-02: 3 [IU] via SUBCUTANEOUS
  Administered 2018-04-02 – 2018-04-03 (×3): 2 [IU] via SUBCUTANEOUS
  Administered 2018-04-04: 1 [IU] via SUBCUTANEOUS
  Administered 2018-04-04: 3 [IU] via SUBCUTANEOUS
  Administered 2018-04-05: 1 [IU] via SUBCUTANEOUS
  Administered 2018-04-06 – 2018-04-08 (×6): 2 [IU] via SUBCUTANEOUS

## 2018-04-01 MED ORDER — DILTIAZEM HCL-DEXTROSE 100-5 MG/100ML-% IV SOLN (PREMIX)
5.0000 mg/h | INTRAVENOUS | Status: DC
  Administered 2018-04-01: 15 mg/h via INTRAVENOUS
  Administered 2018-04-01: 10 mg/h via INTRAVENOUS
  Administered 2018-04-02 (×3): 15 mg/h via INTRAVENOUS
  Filled 2018-04-01 (×3): qty 100

## 2018-04-01 MED ORDER — PNEUMOCOCCAL VAC POLYVALENT 25 MCG/0.5ML IJ INJ: 0.5000 mL | INJECTION | INTRAMUSCULAR | Status: DC

## 2018-04-01 MED ORDER — TIOTROPIUM BROMIDE MONOHYDRATE 18 MCG IN CAPS
18.0000 ug | ORAL_CAPSULE | Freq: Every day | RESPIRATORY_TRACT | Status: DC
  Administered 2018-04-06 – 2018-04-08 (×2): 18 ug via RESPIRATORY_TRACT
  Filled 2018-04-01: qty 5

## 2018-04-01 MED ORDER — BUPROPION HCL ER (XL) 150 MG PO TB24
300.0000 mg | ORAL_TABLET | Freq: Every day | ORAL | Status: DC
  Administered 2018-04-02 – 2018-04-08 (×7): 300 mg via ORAL
  Filled 2018-04-01 (×7): qty 2

## 2018-04-01 MED ORDER — ACETAMINOPHEN 650 MG RE SUPP: 650.0000 mg | Freq: Four times a day (QID) | RECTAL | Status: DC | PRN

## 2018-04-01 MED ORDER — DILTIAZEM HCL 25 MG/5ML IV SOLN
20.0000 mg | Freq: Once | INTRAVENOUS | Status: AC
  Administered 2018-04-01: 20 mg via INTRAVENOUS
  Filled 2018-04-01: qty 5

## 2018-04-01 MED ORDER — ONDANSETRON HCL 4 MG/2ML IJ SOLN
4.0000 mg | Freq: Four times a day (QID) | INTRAMUSCULAR | Status: DC | PRN
  Administered 2018-04-04: 4 mg via INTRAVENOUS
  Filled 2018-04-01: qty 2

## 2018-04-01 MED ORDER — FUROSEMIDE 10 MG/ML IJ SOLN
40.0000 mg | Freq: Two times a day (BID) | INTRAMUSCULAR | Status: DC
  Administered 2018-04-01 – 2018-04-02 (×3): 40 mg via INTRAVENOUS
  Filled 2018-04-01 (×4): qty 4

## 2018-04-01 MED ORDER — HEPARIN (PORCINE) IN NACL 100-0.45 UNIT/ML-% IJ SOLN
1350.0000 [IU]/h | INTRAMUSCULAR | Status: DC
  Administered 2018-04-01: 1200 [IU]/h via INTRAVENOUS
  Administered 2018-04-02: 1350 [IU]/h via INTRAVENOUS
  Filled 2018-04-01 (×2): qty 250

## 2018-04-01 MED ORDER — DILTIAZEM HCL-DEXTROSE 100-5 MG/100ML-% IV SOLN (PREMIX)
5.0000 mg/h | Freq: Once | INTRAVENOUS | Status: AC
  Administered 2018-04-01: 5 mg/h via INTRAVENOUS
  Filled 2018-04-01: qty 100

## 2018-04-01 NOTE — ED Triage Notes (Signed)
Pt presents with shortness of breath x 3 weeks.  Pt was seen by PCP on Monday, started on lasix (65m qd) without any relief or weight loss.  Pt tripled dose last night without relief.  Pt reports swelling to both legs and abdomen; reports dry cough, reports palpitations.

## 2018-04-01 NOTE — Progress Notes (Signed)
Pt arrived to unit accompanied by ER hospital staff. Pt on cardizem gtt running at 20mg/hr. Pt's wheelchair is placed at bedside. Pt is transferred from stretcher to bed. Pt's has no other complaints. Will continue to monitor the pt.

## 2018-04-01 NOTE — ED Provider Notes (Signed)
[facility] [facility] EMERGENCY DEPARTMENT Provider Note   CSN: [id number] Arrival date & time: 04/01/18  1620     History   Chief Complaint Chief Complaint  Patient presents with  . Shortness of Breath    HPI [name] is a 67 y.o. female.  67 year old female with prior medical history as detailed below presents with complaint of shortness of breath.  Patient reports increasing symptoms over the last 2 to 3 weeks.  Patient was diagnosed by her primary earlier this week as having CHF.  She denies any prior history of CHF.  She has been given 3 days of Lasix (20 mg daily) at home without improvement.  She presents today complaining of continued symptoms.  She reports increasing edema to both lower extremities.  She denies associated chest pain.  She does report associated palpitations.  She reports that her palpitations have been ongoing for at least 2 to 3 weeks.  She denies prior history of arrhythmia.  She denies prior history of CAD.  She denies prior work-up for CAD.  The history is provided by the patient and medical records.  Shortness of Breath  This is a new problem. The average episode lasts 3 weeks. The current episode started more than 1 week ago. The problem has been gradually worsening. Associated symptoms include orthopnea and leg swelling. Pertinent negatives include no fever, no headaches, no sore throat and no chest pain.    Past Medical History:  Diagnosis Date  . Depression   . Diabetes mellitus without complication ([place])   . Hypertension   . Osteoporosis   . UTI (lower urinary tract infection) 01/2016   Cipro for Klebsiella pneumoniae isolate  . UTI (lower urinary tract infection) 02/19/2016   Klebsiella    Patient Active Problem List   Diagnosis Date Noted  . [place] 03/11/2016  . Muscle spasms of lower extremity 03/04/2016  . Acute renal insufficiency 02/26/2016  . History of MRSA infection 02/26/2016  . Diabetes type 2, uncontrolled  ([place]) 02/19/2016  . HTN (hypertension), benign 02/19/2016  . Fracture, femur, distal ([place]) 02/19/2016  . Fall 02/19/2016  . Depression 02/19/2016  . Closed left femoral fracture, initial encounter 02/19/2016  . Femur fracture, left ([place]) 02/19/2016    Past Surgical History:  Procedure Laterality Date  . ABDOMINAL HYSTERECTOMY    . CESAREAN SECTION    . CHOLECYSTECTOMY    . FEMUR IM NAIL Left 02/20/2016  . FEMUR IM NAIL Left 02/20/2016   Procedure: INTRAMEDULLARY (IM) RETROGRADE FEMORAL NAILING;  Surgeon: Leandrew Koyanagi, MD;  Location: [facility];  Service: Orthopedics;  Laterality: Left;  . PANCREAS SURGERY  1967   1 cyst excised and one cyst drained  . VEIN LIGATION AND STRIPPING    . WRIST FRACTURE SURGERY       OB History   None      Home Medications    Prior to Admission medications   Medication Sig Start Date End Date Taking? Authorizing Provider  benazepril (LOTENSIN) 20 MG tablet Take 20 mg by mouth daily. 01/23/18  Yes [provider]  buPROPion (WELLBUTRIN XL) 300 MG 24 hr tablet Take 300 mg by mouth daily. 07/06/13  Yes [provider]  cetirizine (ZYRTEC) 10 MG tablet Take 10 mg by mouth at bedtime. 02/26/18  Yes [provider]  furosemide (LASIX) 20 MG tablet Take 20 mg by mouth See admin instructions. Take 20 mg by mouth for 3 days then stop for 3 days, then resume  as needed for swelling of the legs 03/29/18  Yes [provider]  glipiZIDE (GLUCOTROL) 10 MG tablet Take 10 mg by mouth 2 (two) times daily. 03/02/18  Yes [provider]  ibuprofen (ADVIL,MOTRIN) 200 MG tablet Take 800 mg by mouth every 6 (six) hours as needed (for pain).   Yes [provider]  metFORMIN (GLUCOPHAGE-XR) 500 MG 24 hr tablet Take 1,000 mg by mouth daily with supper. 01/23/18  Yes [provider]  pioglitazone (ACTOS) 45 MG tablet Take 45 mg by mouth daily. 03/02/18  Yes [provider]  SPIRIVA RESPIMAT 1.25 MCG/ACT AERS Inhale 2  sprays into the lungs daily. 01/21/18  Yes [provider]  amLODipine (NORVASC) 5 MG tablet Take 1 tablet (5 mg total) by mouth daily. Patient not taking: Reported on 04/01/2018 02/22/16   Robbie Lis, MD  calcium-vitamin D (OSCAL WITH D) 500-200 MG-UNIT tablet Take 1 tablet by mouth 2 (two) times daily. Patient not taking: Reported on 04/01/2018 02/22/16   Robbie Lis, MD  clonazePAM (KLONOPIN) 1 MG tablet Take 1 tablet (1 mg total) by mouth at bedtime. Patient not taking: Reported on 04/01/2018 02/22/16   Gildardo Cranker, DO  enoxaparin (LOVENOX) 40 MG/0.4ML injection Inject 0.4 mLs (40 mg total) into the skin daily. Patient not taking: Reported on 04/01/2018 02/20/16   Leandrew Koyanagi, MD    Family History Family History  Problem Relation Age of Onset  . Diabetes Mother   . Cancer Mother   . Stroke Mother   . Breast cancer Mother   . Diabetes Father   . Stroke Father   . Diabetes Brother   . Diabetes Maternal Grandmother   . Diabetes Maternal Grandfather   . Diabetes Paternal Grandmother   . Diabetes Paternal Grandfather   . Breast cancer Maternal Aunt     Social History Social History   Tobacco Use  . Smoking status: Never Smoker  . Smokeless tobacco: Never Used  Substance Use Topics  . Alcohol use: No  . Drug use: No     Allergies   Latex   Review of Systems Review of Systems  Constitutional: Negative for fever.  HENT: Negative for sore throat.   Respiratory: Positive for shortness of breath.   Cardiovascular: Positive for orthopnea and leg swelling. Negative for chest pain.  Neurological: Negative for headaches.  All other systems reviewed and are negative.    Physical Exam Updated Vital Signs BP 120/66   Pulse (!) 130   Temp (!) 97.4 F (36.3 C) (Oral)   Resp 20   Ht 5' 4"  (1.626 m)   Wt 121.1 kg   SpO2 97%   BMI 45.83 kg/m   Physical Exam  Constitutional: She is oriented to person, place, and time. She appears well-developed and  well-nourished. No distress.  HENT:  Head: Normocephalic and atraumatic.  Mouth/Throat: Oropharynx is clear and moist.  Eyes: Pupils are equal, round, and reactive to light. Conjunctivae and EOM are normal.  Neck: Normal range of motion. Neck supple.  Cardiovascular:  Irregular rate and rhythm, tachycardic  Pulmonary/Chest: Effort normal and breath sounds normal. No respiratory distress.  Abdominal: Soft. She exhibits no distension. There is no tenderness.  Musculoskeletal: Normal range of motion. She exhibits no edema or deformity.  2+ bilateral lower extremity edema  Neurological: She is alert and oriented to person, place, and time.  Skin: Skin is warm and dry.  Psychiatric: She has a normal mood and affect.  Nursing note  and vitals reviewed.    ED Treatments / Results  Labs (all labs ordered are listed, but only abnormal results are displayed) Labs Reviewed  BASIC METABOLIC PANEL - Abnormal; Notable for the following components:      Result Value   CO2 20 (*)    Glucose, Bld 239 (*)    BUN 26 (*)    Creatinine, Ser 1.86 (*)    GFR calc non Af Amer 27 (*)    GFR calc Af Amer 31 (*)    All other components within normal limits  CBC - Abnormal; Notable for the following components:   RBC 3.55 (*)    Hemoglobin 9.5 (*)    MCV 101.4 (*)    MCHC 26.4 (*)    All other components within normal limits  I-STAT TROPONIN, ED    EKG EKG Interpretation  Date/Time:  Thursday April 01 2018 16:30:58 EDT Ventricular Rate:  137 PR Interval:    QRS Duration: 74 QT Interval:  324 QTC Calculation: 489 R Axis:   42 Text Interpretation:  Atrial fibrillation with rapid ventricular response Low voltage QRS Nonspecific T wave abnormality , probably digitalis effect Abnormal ECG Confirmed by Dene Gentry [phone]) on 04/01/2018 4:58:41 PM   Radiology Dg Chest 2 View  Result Date: 04/01/2018 CLINICAL DATA:  Shortness of breath EXAM: CHEST - 2 VIEW COMPARISON:  03/29/2018, 02/19/2016  FINDINGS: Mild cardiomegaly with vascular congestion and mild diffuse interstitial opacities suggesting minimal edema. No pleural effusion. Possible small anterior lower lobe infiltrate on the lateral view. Aortic atherosclerosis. No pneumothorax. IMPRESSION: 1. Cardiomegaly with vascular congestion and mild interstitial edema 2. Possible small anterior lung base infiltrate on the lateral view. Electronically Signed   By: Donavan Foil M.D.   On: 04/01/2018 18:48    Procedures Procedures (including critical care time)  Medications Ordered in ED Medications  diltiazem (CARDIZEM) injection 10 mg (10 mg Intravenous Given 04/01/18 1757)  diltiazem (CARDIZEM) injection 20 mg (20 mg Intravenous Given 04/01/18 1901)     Initial Impression / Assessment and Plan / ED Course  I have reviewed the triage vital signs and the nursing notes.  Pertinent labs & imaging results that were available during my care of the patient were reviewed by me and considered in my medical decision making (see chart for details).     MDM  Screen complete  She is presenting for evaluation of shortness of breath.  Patient appears to be in A. fib with RVR upon arrival.  This appears to be a new diagnosis.  Patient is received 2 doses of Cardizem in the ED with improvement in her rate. Chest XR suggests moderate CHF.   Renal function appears to be mildly impaired with a BUN of 26 and a creatinine 1.86.  Troponin is 0.02.  Patient requires admission for further work-up and treatment.  Hospitalist service is made aware of the case and will evaluate for admission.    Final Clinical Impressions(s) / ED Diagnoses   Final diagnoses:  SOB (shortness of breath)  Atrial fibrillation with RVR [facility])    ED Discharge Orders    None       Valarie Merino, MD 04/01/18 2012

## 2018-04-01 NOTE — ED Notes (Signed)
IV team bedside. 

## 2018-04-01 NOTE — ED Notes (Signed)
ED Provider at bedside. 

## 2018-04-01 NOTE — H&P (Signed)
History and Physical    [name] TKZ:[id number] DOB: [birth year] DOA: 04/01/2018  PCP: [place], [place]  Patient coming from: Home.  Chief Complaint: Shortness of breath.  HPI: [name] is a 67 y.o. female with history of hypertension diabetes mellitus type 2 has been experiencing shortness of breath with exertion and lying down over the last 3 weeks.  Last week patient's primary care physician placed patient on Lasix which patient took last 3 days despite which patient states she has not found any difference.  Patient also was placed on ACE inhibitor last month for blood pressure.  Denies any palpitations or chest pain.  Denies any productive cough fever or chills.  Has noticed increasing lower extremity edema over the last 3 weeks.  Today patient called back her PCP stating she is getting more short of breath and was instructed to come to the ER.  ED Course: In the ER patient is found to be in A. fib with RVR was given Cardizem bolus twice following which was started on Cardizem infusion.  Chest x-ray shows congestion on exam patient has elevated JVD and bilateral lower extremity edema.  Patient admitted for acute CHF unknown EF with A. fib with RVR.  Review of Systems: As per HPI, rest all negative.   Past Medical History:  Diagnosis Date  . Depression   . Diabetes mellitus without complication ([place])   . Hypertension   . Osteoporosis   . UTI (lower urinary tract infection) 01/2016   Cipro for Klebsiella pneumoniae isolate  . UTI (lower urinary tract infection) 02/19/2016   Klebsiella    Past Surgical History:  Procedure Laterality Date  . ABDOMINAL HYSTERECTOMY    . CESAREAN SECTION    . CHOLECYSTECTOMY    . FEMUR IM NAIL Left 02/20/2016  . FEMUR IM NAIL Left 02/20/2016   Procedure: INTRAMEDULLARY (IM) RETROGRADE FEMORAL NAILING;  Surgeon: Leandrew Koyanagi, MD;  Location: [facility];  Service: Orthopedics;  Laterality: Left;  . PANCREAS SURGERY  1967   1  cyst excised and one cyst drained  . VEIN LIGATION AND STRIPPING    . WRIST FRACTURE SURGERY       reports that she has never smoked. She has never used smokeless tobacco. She reports that she does not drink alcohol or use drugs.  Allergies  Allergen Reactions  . Latex Itching and Rash    Family History  Problem Relation Age of Onset  . Diabetes Mother   . Cancer Mother   . Stroke Mother   . Breast cancer Mother   . Diabetes Father   . Stroke Father   . Diabetes Brother   . Diabetes Maternal Grandmother   . Diabetes Maternal Grandfather   . Diabetes Paternal Grandmother   . Diabetes Paternal Grandfather   . Breast cancer Maternal Aunt     Prior to Admission medications   Medication Sig Start Date End Date Taking? Authorizing Provider  benazepril (LOTENSIN) 20 MG tablet Take 20 mg by mouth daily. 01/23/18  Yes [provider]  buPROPion (WELLBUTRIN XL) 300 MG 24 hr tablet Take 300 mg by mouth daily. 07/06/13  Yes [provider]  cetirizine (ZYRTEC) 10 MG tablet Take 10 mg by mouth at bedtime. 02/26/18  Yes [provider]  furosemide (LASIX) 20 MG tablet Take 20 mg by mouth See admin instructions. Take 20 mg by mouth for 3 days then stop for 3 days, then resume as needed for swelling of  the legs 03/29/18  Yes [provider]  glipiZIDE (GLUCOTROL) 10 MG tablet Take 10 mg by mouth 2 (two) times daily. 03/02/18  Yes [provider]  ibuprofen (ADVIL,MOTRIN) 200 MG tablet Take 800 mg by mouth every 6 (six) hours as needed (for pain).   Yes [provider]  metFORMIN (GLUCOPHAGE-XR) 500 MG 24 hr tablet Take 1,000 mg by mouth daily with supper. 01/23/18  Yes [provider]  pioglitazone (ACTOS) 45 MG tablet Take 45 mg by mouth daily. 03/02/18  Yes [provider]  SPIRIVA RESPIMAT 1.25 MCG/ACT AERS Inhale 2 sprays into the lungs daily. 01/21/18  Yes [provider]  amLODipine (NORVASC) 5 MG tablet Take 1 tablet  (5 mg total) by mouth daily. Patient not taking: Reported on 04/01/2018 02/22/16   Robbie Lis, MD  calcium-vitamin D (OSCAL WITH D) 500-200 MG-UNIT tablet Take 1 tablet by mouth 2 (two) times daily. Patient not taking: Reported on 04/01/2018 02/22/16   Robbie Lis, MD  clonazePAM (KLONOPIN) 1 MG tablet Take 1 tablet (1 mg total) by mouth at bedtime. Patient not taking: Reported on 04/01/2018 02/22/16   Gildardo Cranker, DO  enoxaparin (LOVENOX) 40 MG/0.4ML injection Inject 0.4 mLs (40 mg total) into the skin daily. Patient not taking: Reported on 04/01/2018 02/20/16   Leandrew Koyanagi, MD    Physical Exam: Vitals:   04/01/18 1900 04/01/18 1905 04/01/18 2017 04/01/18 2100  BP: 110/63 120/66 138/78 118/74  Pulse:   (!) 134   Resp: 12 20 18  (!) 24  Temp:      TempSrc:      SpO2:   97%   Weight:      Height:          Constitutional: Moderately built and nourished. Vitals:   04/01/18 1900 04/01/18 1905 04/01/18 2017 04/01/18 2100  BP: 110/63 120/66 138/78 118/74  Pulse:   (!) 134   Resp: 12 20 18  (!) 24  Temp:      TempSrc:      SpO2:   97%   Weight:      Height:       Eyes: Anicteric no pallor. ENMT: No discharge from the ears eyes nose or mouth. Neck: JVD elevated no mass felt. Respiratory: No rhonchi or crepitations present. Cardiovascular: S1-S2 heard no murmurs appreciated. Abdomen: Soft nontender bowel sounds present. Musculoskeletal: Bilateral lower extremity edema extending up to the thigh. Skin: No rash. Neurologic: Alert awake oriented to time place and person.  Moves all extremities. Psychiatric: Appears normal.  Normal affect.   Labs on Admission: I have personally reviewed following labs and imaging studies  CBC: Recent Labs  Lab 04/01/18 1653  WBC 5.8  HGB 9.5*  HCT 36.0  MCV 101.4*  PLT 681   Basic Metabolic Panel: Recent Labs  Lab 04/01/18 1653  NA 141  K 4.2  CL 107  CO2 20*  GLUCOSE 239*  BUN 26*  CREATININE 1.86*  CALCIUM 8.9    GFR: Estimated Creatinine Clearance: 37.7 mL/min (A) (by C-G formula based on SCr of 1.86 mg/dL (H)). Liver Function Tests: No results for input(s): AST, ALT, ALKPHOS, BILITOT, PROT, ALBUMIN in the last 168 hours. No results for input(s): LIPASE, AMYLASE in the last 168 hours. No results for input(s): AMMONIA in the last 168 hours. Coagulation Profile: No results for input(s): INR, PROTIME in the last 168 hours. Cardiac Enzymes: No results for input(s): CKTOTAL, CKMB, CKMBINDEX, TROPONINI in the last 168 hours. BNP (last  3 results) No results for input(s): PROBNP in the last 8760 hours. HbA1C: No results for input(s): HGBA1C in the last 72 hours. CBG: No results for input(s): GLUCAP in the last 168 hours. Lipid Profile: No results for input(s): CHOL, HDL, LDLCALC, TRIG, CHOLHDL, LDLDIRECT in the last 72 hours. Thyroid Function Tests: No results for input(s): TSH, T4TOTAL, FREET4, T3FREE, THYROIDAB in the last 72 hours. Anemia Panel: No results for input(s): VITAMINB12, FOLATE, FERRITIN, TIBC, IRON, RETICCTPCT in the last 72 hours. Urine analysis:    Component Value Date/Time   COLORURINE YELLOW 05/06/2016 1830   APPEARANCEUR CLOUDY (A) 05/06/2016 1830   LABSPEC >1.030 (H) 05/06/2016 1830   PHURINE 5.0 05/06/2016 1830   GLUCOSEU NEGATIVE 05/06/2016 1830   HGBUR LARGE (A) 05/06/2016 1830   BILIRUBINUR NEGATIVE 05/06/2016 1830   KETONESUR TRACE (A) 05/06/2016 1830   PROTEINUR 30 (A) 05/06/2016 1830   UROBILINOGEN 0.2 12/01/2007 1025   NITRITE POSITIVE (A) 05/06/2016 1830   LEUKOCYTESUR MODERATE (A) 05/06/2016 1830   Sepsis Labs: @LABRCNTIP (procalcitonin:4,lacticidven:4) )No results found for this or any previous visit (from the past 240 hour(s)).   Radiological Exams on Admission: Dg Chest 2 View  Result Date: 04/01/2018 CLINICAL DATA:  Shortness of breath EXAM: CHEST - 2 VIEW COMPARISON:  03/29/2018, 02/19/2016 FINDINGS: Mild cardiomegaly with vascular congestion and  mild diffuse interstitial opacities suggesting minimal edema. No pleural effusion. Possible small anterior lower lobe infiltrate on the lateral view. Aortic atherosclerosis. No pneumothorax. IMPRESSION: 1. Cardiomegaly with vascular congestion and mild interstitial edema 2. Possible small anterior lung base infiltrate on the lateral view. Electronically Signed   By: Donavan Foil M.D.   On: 04/01/2018 18:48    EKG: Independently reviewed.  A. fib with RVR.  Assessment/Plan Principal Problem:   Acute CHF (congestive heart failure) (HCC) Active Problems:   Diabetes type 2, uncontrolled (HCC)   HTN (hypertension), benign   Acute renal insufficiency   Atrial fibrillation with RVR ([place])    1. Acute CHF unknown EF likely precipitated in the setting of A. fib with RVR -I have dose patient Lasix 40 mg IV every 12.  Closely follow intake output metabolic panel cycle cardiac markers check TSH and check 2D echo.  Cardiology consult.  Patient eventually would need to come off Actos due to CHF. 2. A. fib with RVR new onset patient has not noticed any palpitation previously.  Time duration not clear.  Patient started on Cardizem infusion.  Will need to transition to oral Cardizem slowly.  Check TSH cardiac markers 2D echo.  Since patient's chads 2 vasc score is at least 3 we will keep patient on heparin infusion which can be transitioned to oral anticoagulants. 3. Acute renal failure likely due to patient recently using benazepril and Lasix but I am holding off benazepril for now.  Closely follow metabolic panel and UA.  Patient also takes ibuprofen which could be contributing to renal failure and advised not to take. 4. Hypertension presently on Cardizem infusion and Lasix. 5. Diabetes mellitus type 2 uncontrolled -last hemoglobin A1c is not known.  Patient is placed on sliding scale coverage and I will add Lantus based on CBG.  Patient will eventually need to come off Actos due to CHF. 6. Chronic anemia  follow CBC.  Check anemia panel.   DVT prophylaxis: Heparin. Code Status: Full code. Family Communication: Discussed with patient. Disposition Plan: Home. Consults called: Cardiology. Admission status: Inpatient.   Rise Patience MD [facility] Pager [phone].  If  7PM-7AM, please contact night-coverage [email or web address] Password [facility]  04/01/2018, 10:26 PM

## 2018-04-01 NOTE — Progress Notes (Signed)
ANTICOAGULATION CONSULT NOTE - Initial Consult  Pharmacy Consult for heparin Indication: atrial fibrillation  Allergies  Allergen Reactions  . Latex Itching and Rash    Patient Measurements: Height: 5' 4"  (162.6 cm) Weight: 267 lb (121.1 kg) IBW/kg (Calculated) : 54.7 Heparin Dosing Weight: 84.2  Vital Signs: Temp: 97.4 F (36.3 C) (08/15 1634) Temp Source: Oral (08/15 1634) BP: 120/66 (08/15 1905) Pulse Rate: 130 (08/15 1745)  Labs: Recent Labs    04/01/18 1653  HGB 9.5*  HCT 36.0  PLT 191  CREATININE 1.86*    Estimated Creatinine Clearance: 37.7 mL/min (A) (by C-G formula based on SCr of 1.86 mg/dL (H)).   Medical History: Past Medical History:  Diagnosis Date  . Depression   . Diabetes mellitus without complication ([place])   . Hypertension   . Osteoporosis   . UTI (lower urinary tract infection) 01/2016   Cipro for Klebsiella pneumoniae isolate  . UTI (lower urinary tract infection) 02/19/2016   Klebsiella    Medications:  Scheduled:   Infusions:  . diltiazem (CARDIZEM) infusion     PRN:   Assessment: 67 yo F presenting with shortness of breath, weight gain w/ new onset afib. Patient was not taking any anticoagulation medications PTA.   Hgb 9.8, plt 191 - no signs or symptoms of bleeding noted  Goal of Therapy:  Heparin level 0.3-0.7 units/ml Monitor platelets by anticoagulation protocol: Yes   Plan:  Give 4,000 units bolus x 1 Start heparin infusion at 1,200 units/hr Check anti-Xa level in 6 hours and daily while on heparin Continue to monitor H&H and platelets  Vertis Kelch, PharmD PGY1 Pharmacy Resident Phone [phone] 04/01/2018       8:16 PM

## 2018-04-01 NOTE — ED Notes (Signed)
Patient transported to X-ray 

## 2018-04-01 NOTE — ED Provider Notes (Signed)
Patient placed in Quick Look pathway, seen and evaluated   Chief Complaint: shortness of breath  HPI:  [name] is a 67 y.o. female who presents with shortness of breath x 3 weeks.  Pt was seen by PCP on Monday, started on lasix (99m qd) without any relief or weight loss.  Pt tripled dose last night without relief.  Pt reports swelling to both legs and abdomen; reports dry cough, reports palpitations.  ROS: General: weight gain  Resp: shortness of breath  Physical Exam:  BP (!) 151/101   Pulse (!) 153   Temp (!) 97.4 F (36.3 C) (Oral)   Resp (!) 22   Ht 5' 4"  (1.626 m)   Wt 121.1 kg   SpO2 100%   BMI 45.83 kg/m    Gen: No distress  Neuro: Awake and Alert  Skin: Warm and dry  Heart: tachycardia, irregular  Lungs: decreased breath sounds right  EKG: A-fib   Initiation of care has begun. The patient has been counseled on the process, plan, and necessity for staying for the completion/evaluation, and the remainder of the medical screening examination    NAshley Murrain NP 04/01/18 1652    ALacretia Leigh MD 04/01/18 2237

## 2018-04-02 ENCOUNTER — Encounter (HOSPITAL_COMMUNITY): Payer: Self-pay | Admitting: Nurse Practitioner

## 2018-04-02 ENCOUNTER — Inpatient Hospital Stay (HOSPITAL_COMMUNITY): Payer: BLUE CROSS/BLUE SHIELD

## 2018-04-02 DIAGNOSIS — I10 Essential (primary) hypertension: Secondary | ICD-10-CM

## 2018-04-02 DIAGNOSIS — I4891 Unspecified atrial fibrillation: Secondary | ICD-10-CM

## 2018-04-02 DIAGNOSIS — I361 Nonrheumatic tricuspid (valve) insufficiency: Secondary | ICD-10-CM

## 2018-04-02 DIAGNOSIS — N289 Disorder of kidney and ureter, unspecified: Secondary | ICD-10-CM

## 2018-04-02 DIAGNOSIS — E1165 Type 2 diabetes mellitus with hyperglycemia: Secondary | ICD-10-CM

## 2018-04-02 DIAGNOSIS — I5031 Acute diastolic (congestive) heart failure: Secondary | ICD-10-CM

## 2018-04-02 DIAGNOSIS — I481 Persistent atrial fibrillation: Secondary | ICD-10-CM

## 2018-04-02 LAB — GLUCOSE, CAPILLARY
GLUCOSE-CAPILLARY: 182 mg/dL — AB (ref 70–99)
GLUCOSE-CAPILLARY: 194 mg/dL — AB (ref 70–99)
Glucose-Capillary: 184 mg/dL — ABNORMAL HIGH (ref 70–99)
Glucose-Capillary: 218 mg/dL — ABNORMAL HIGH (ref 70–99)

## 2018-04-02 LAB — CBC
HEMATOCRIT: 27.3 % — AB (ref 36.0–46.0)
HEMOGLOBIN: 8.6 g/dL — AB (ref 12.0–15.0)
MCH: 27.3 pg (ref 26.0–34.0)
MCHC: 31.5 g/dL (ref 30.0–36.0)
MCV: 86.7 fL (ref 78.0–100.0)
Platelets: 302 10*3/uL (ref 150–400)
RBC: 3.15 MIL/uL — AB (ref 3.87–5.11)
RDW: 15.2 % (ref 11.5–15.5)
WBC: 8.1 10*3/uL (ref 4.0–10.5)

## 2018-04-02 LAB — BASIC METABOLIC PANEL
Anion gap: 10 (ref 5–15)
BUN: 27 mg/dL — ABNORMAL HIGH (ref 8–23)
CHLORIDE: 108 mmol/L (ref 98–111)
CO2: 22 mmol/L (ref 22–32)
Calcium: 8.1 mg/dL — ABNORMAL LOW (ref 8.9–10.3)
Creatinine, Ser: 1.79 mg/dL — ABNORMAL HIGH (ref 0.44–1.00)
GFR, EST AFRICAN AMERICAN: 33 mL/min — AB (ref >60)
GFR, EST NON AFRICAN AMERICAN: 28 mL/min — AB (ref >60)
Glucose, Bld: 229 mg/dL — ABNORMAL HIGH (ref 70–99)
POTASSIUM: 3.9 mmol/L (ref 3.5–5.1)
SODIUM: 140 mmol/L (ref 135–145)

## 2018-04-02 LAB — RETICULOCYTES
RBC.: 3.07 MIL/uL — ABNORMAL LOW (ref 3.87–5.11)
Retic Count, Absolute: 70.6 10*3/uL (ref 19.0–186.0)
Retic Ct Pct: 2.3 % (ref 0.4–3.1)

## 2018-04-02 LAB — HIV ANTIBODY (ROUTINE TESTING W REFLEX): HIV Screen 4th Generation wRfx: NONREACTIVE

## 2018-04-02 LAB — IRON AND TIBC
Iron: 28 ug/dL (ref 28–170)
SATURATION RATIOS: 8 % — AB (ref 10.4–31.8)
TIBC: 363 ug/dL (ref 250–450)
UIBC: 335 ug/dL

## 2018-04-02 LAB — VITAMIN B12: Vitamin B-12: 264 pg/mL (ref 180–914)

## 2018-04-02 LAB — FOLATE: Folate: 12.9 ng/mL (ref >5.9)

## 2018-04-02 LAB — MRSA PCR SCREENING: MRSA by PCR: NEGATIVE

## 2018-04-02 LAB — MAGNESIUM
Magnesium: 1.2 mg/dL — ABNORMAL LOW (ref 1.7–2.4)
Magnesium: 2 mg/dL (ref 1.7–2.4)

## 2018-04-02 LAB — HEPARIN LEVEL (UNFRACTIONATED)
HEPARIN UNFRACTIONATED: 0.25 [IU]/mL — AB (ref 0.30–0.70)
HEPARIN UNFRACTIONATED: 0.4 [IU]/mL (ref 0.30–0.70)

## 2018-04-02 LAB — TSH: TSH: 1.398 u[IU]/mL (ref 0.350–4.500)

## 2018-04-02 LAB — ECHOCARDIOGRAM COMPLETE
Height: 64 in
Weight: 4275.16 oz

## 2018-04-02 LAB — TROPONIN I

## 2018-04-02 LAB — FERRITIN: FERRITIN: 38 ng/mL (ref 11–307)

## 2018-04-02 MED ORDER — INSULIN GLARGINE 100 UNIT/ML ~~LOC~~ SOLN
14.0000 [IU] | Freq: Every day | SUBCUTANEOUS | Status: DC
  Administered 2018-04-03: 14 [IU] via SUBCUTANEOUS
  Filled 2018-04-02: qty 0.14

## 2018-04-02 MED ORDER — FLUTICASONE PROPIONATE 50 MCG/ACT NA SUSP
2.0000 | Freq: Every day | NASAL | Status: DC
  Administered 2018-04-02 – 2018-04-08 (×7): 2 via NASAL
  Filled 2018-04-02: qty 16

## 2018-04-02 MED ORDER — TRAZODONE HCL 50 MG PO TABS
50.0000 mg | ORAL_TABLET | Freq: Once | ORAL | Status: AC
  Administered 2018-04-02: 50 mg via ORAL
  Filled 2018-04-02: qty 1

## 2018-04-02 MED ORDER — INSULIN GLARGINE 100 UNIT/ML ~~LOC~~ SOLN
10.0000 [IU] | Freq: Every day | SUBCUTANEOUS | Status: DC
  Administered 2018-04-02: 10 [IU] via SUBCUTANEOUS
  Filled 2018-04-02: qty 0.1

## 2018-04-02 MED ORDER — APIXABAN 5 MG PO TABS
5.0000 mg | ORAL_TABLET | Freq: Two times a day (BID) | ORAL | Status: DC
  Administered 2018-04-02 – 2018-04-05 (×6): 5 mg via ORAL
  Filled 2018-04-02 (×6): qty 1

## 2018-04-02 MED ORDER — FLUTICASONE PROPIONATE 50 MCG/ACT NA SUSP: 2.0000 | Freq: Every day | NASAL | Status: DC

## 2018-04-02 MED ORDER — METOPROLOL TARTRATE 25 MG PO TABS
25.0000 mg | ORAL_TABLET | Freq: Two times a day (BID) | ORAL | Status: DC
  Administered 2018-04-02 – 2018-04-06 (×8): 25 mg via ORAL
  Filled 2018-04-02 (×8): qty 1

## 2018-04-02 MED ORDER — MAGNESIUM SULFATE 2 GM/50ML IV SOLN: 2.0000 g | Freq: Once | INTRAVENOUS | Status: DC

## 2018-04-02 MED ORDER — DILTIAZEM LOAD VIA INFUSION
5.0000 mg | Freq: Once | INTRAVENOUS | Status: AC
  Administered 2018-04-02: 5 mg via INTRAVENOUS
  Filled 2018-04-02: qty 5

## 2018-04-02 MED ORDER — LORATADINE 10 MG PO TABS
10.0000 mg | ORAL_TABLET | Freq: Every day | ORAL | Status: DC
  Administered 2018-04-02 – 2018-04-08 (×7): 10 mg via ORAL
  Filled 2018-04-02 (×8): qty 1

## 2018-04-02 MED ORDER — GUAIFENESIN ER 600 MG PO TB12
600.0000 mg | ORAL_TABLET | Freq: Two times a day (BID) | ORAL | Status: DC | PRN
  Administered 2018-04-02 – 2018-04-04 (×3): 600 mg via ORAL
  Filled 2018-04-02 (×3): qty 1

## 2018-04-02 MED ORDER — AMIODARONE IV BOLUS ONLY 150 MG/100ML
150.0000 mg | Freq: Once | INTRAVENOUS | Status: AC
  Administered 2018-04-02: 150 mg via INTRAVENOUS
  Filled 2018-04-02: qty 100

## 2018-04-02 MED ORDER — CYANOCOBALAMIN 1000 MCG/ML IJ SOLN
1000.0000 ug | Freq: Once | INTRAMUSCULAR | Status: AC
  Administered 2018-04-02: 1000 ug via SUBCUTANEOUS
  Filled 2018-04-02: qty 1

## 2018-04-02 MED ORDER — MAGNESIUM SULFATE 4 GM/100ML IV SOLN
4.0000 g | Freq: Once | INTRAVENOUS | Status: AC
  Administered 2018-04-02: 4 g via INTRAVENOUS
  Filled 2018-04-02: qty 100

## 2018-04-02 NOTE — Progress Notes (Addendum)
[name] TEAM 1 - Stepdown/ICU TEAM  [name]  ZSW:[id number] DOB: [birth year] DOA: 04/01/2018 PCP: [facility]    Brief Narrative:  67 y.o. female with a hx of HTN and DM2 who c/o  SOB w/ exertion and lying down over the 3 weeks.  Denied palpitations or chest pain. Noticed increasing lower extremity edema not responsive to lasix newly started by her PCP.   In the ER she was found to be in A. fib with RVR.  On exam patient had elevated JVD and bilateral lower extremity edema.    Significant Events: 8/15 admit  Subjective: The patient is resting comfortably in bed.  She denies chest pain abdominal pain nausea or vomiting.  She has noted decreased swelling in her legs and significantly less dyspnea since her admission.  Assessment & Plan:  Afib w/ RVR - newly diagnosed CHA2DS2-VASc score is at least 3 - rate currently controlled - TSH normal - cont IV heparin and cardizem gtt for now - maximize electrolytes - f/u TTE - ask Cards to see to determine if consideration should be given to conversion to NSR   CHF - unable to classify further at this time May simple be rate related - awaiting TTE - diurese and follow   Filed Weights   04/01/18 1633 04/02/18 0352  Weight: 121.1 kg 121.2 kg    Hypomagnesemia  Push to goal of 2.0 or >  Acute renal failure  crt improving - perhaps poor CO in setting of ACEi is to blame - follow trend  Recent Labs  Lab 04/01/18 1653 04/02/18 0220  CREATININE 1.86* 1.79*    HTN Well controlled at this time   DM2 CBG not at goal - adjust tx and follow  Chronic anemia Fe studies suggestive of ACD type picture - follow  Morbid obesity - Body mass index is 45.86 kg/m.   DVT prophylaxis: IV heparin  Code Status: FULL CODE Family Communication: no family present at time of exam  Disposition Plan: SDU on cardizem gtt   Consultants:  none  Antimicrobials:  none   Objective: Blood pressure (!) 101/53, pulse  (!) 103, temperature 97.8 F (36.6 C), temperature source Oral, resp. rate 19, height 5' 4"  (1.626 m), weight 121.2 kg, SpO2 98 %.  Intake/Output Summary (Last 24 hours) at 04/02/2018 1408 Last data filed at 04/02/2018 0946 Gross per 24 hour  Intake 240 ml  Output 350 ml  Net -110 ml   Filed Weights   04/01/18 1633 04/02/18 0352  Weight: 121.1 kg 121.2 kg    Examination: General: No acute respiratory distress Lungs: Clear to auscultation bilaterally without wheezes or crackles Cardiovascular: Irreg irreg - no gallup or rub - no M  Abdomen: Nontender, nondistended, soft, bowel sounds positive, no rebound, no ascites, no appreciable mass Extremities: 1+ B LE edema   CBC: Recent Labs  Lab 04/01/18 1653 04/02/18 0428  WBC 5.8 8.1  HGB 9.5* 8.6*  HCT 36.0 27.3*  MCV 101.4* 86.7  PLT 191 322   Basic Metabolic Panel: Recent Labs  Lab 04/01/18 1653 04/01/18 2245 04/02/18 0220  NA 141  --  140  K 4.2  --  3.9  CL 107  --  108  CO2 20*  --  22  GLUCOSE 239*  --  229*  BUN 26*  --  27*  CREATININE 1.86*  --  1.79*  CALCIUM 8.9  --  8.1*  MG  --  1.2*  --  GFR: Estimated Creatinine Clearance: 39.1 mL/min (A) (by C-G formula based on SCr of 1.79 mg/dL (H)).  Liver Function Tests: No results for input(s): AST, ALT, ALKPHOS, BILITOT, PROT, ALBUMIN in the last 168 hours. No results for input(s): LIPASE, AMYLASE in the last 168 hours. No results for input(s): AMMONIA in the last 168 hours.  Coagulation Profile: No results for input(s): INR, PROTIME in the last 168 hours.  Cardiac Enzymes: Recent Labs  Lab 04/01/18 2245 04/02/18 0220 04/02/18 0952  TROPONINI <0.03 <0.03 <0.03    HbA1C: Hgb A1c MFr Bld  Date/Time Value Ref Range Status  02/27/2016 07:20 AM 7.0 (H) 4.8 - 5.6 % Final    Comment:    (NOTE)         Pre-diabetes: 5.7 - 6.4         Diabetes: >6.4         Glycemic control for adults with diabetes: <7.0   11/11/2007 08:05 AM (H)  Final    9.0 (NOTE)   The ADA recommends the following therapeutic goals for glycemic   control related to Hgb A1C measurement:   Goal of Therapy:   < 7.0% Hgb A1C   Action Suggested:  > 8.0% Hgb A1C   Ref:  Diabetes Care, 22, Suppl. 1, 1999    CBG: Recent Labs  Lab 04/01/18 2245 04/02/18 0749 04/02/18 1112  GLUCAP 222* 184* 218*    Recent Results (from the past 240 hour(s))  MRSA PCR Screening     Status: None   Collection Time: 04/02/18  3:53 AM  Result Value Ref Range Status   MRSA by PCR NEGATIVE NEGATIVE Final    Comment:        The GeneXpert MRSA Assay (FDA approved for NASAL specimens only), is one component of a comprehensive MRSA colonization surveillance program. It is not intended to diagnose MRSA infection nor to guide or monitor treatment for MRSA infections. Performed at [facility], [place] [address], [place], [place] [address]      Scheduled Meds: . buPROPion  300 mg Oral Daily  . furosemide  40 mg Intravenous BID  . insulin aspart  0-9 Units Subcutaneous TID WC  . insulin glargine  10 Units Subcutaneous Daily  . loratadine  10 mg Oral Daily  . pneumococcal 23 valent vaccine  0.5 mL Intramuscular Tomorrow-1000  . tiotropium  18 mcg Inhalation Daily   Continuous Infusions: . diltiazem (CARDIZEM) infusion 15 mg/hr (04/02/18 0919)  . heparin 1,350 Units/hr (04/02/18 0301)  . magnesium sulfate 1 - 4 g bolus IVPB       LOS: 1 day   Katrina Altes, MD [facility] Office  [phone] Pager - Text Page per Amion  If 7PM-7AM, please contact night-coverage per Amion 04/02/2018, 2:08 PM

## 2018-04-02 NOTE — Consult Note (Addendum)
Cardiology Consult    Patient ID: [name] MRN: [id number], DOB/AGE: 01/24/1951   Admit date: 04/01/2018 Date of Consult: 04/02/2018  Primary Physician: Pa, [place] Primary Cardiologist: Pt will f/u in [place] Requesting Provider: Sheral Flow, MD  Patient Profile    [name] is a 67 y.o. female with a history of HTN, DMII, obesity, depression, and unsteady gait (now w/c bound), who is being seen today for the evaluation of rapid afib and CHF at the request of Dr. Thereasa Solo.  Past Medical History   Past Medical History:  Diagnosis Date  . Depression   . Diabetes mellitus without complication ([place])   . Hypertension   . Morbid obesity ([place])   . Osteoporosis   . UTI (lower urinary tract infection) 01/2016   Cipro for Klebsiella pneumoniae isolate    Past Surgical History:  Procedure Laterality Date  . ABDOMINAL HYSTERECTOMY    . CESAREAN SECTION    . CHOLECYSTECTOMY    . FEMUR IM NAIL Left 02/20/2016  . FEMUR IM NAIL Left 02/20/2016   Procedure: INTRAMEDULLARY (IM) RETROGRADE FEMORAL NAILING;  Surgeon: Leandrew Koyanagi, MD;  Location: [place];  Service: Orthopedics;  Laterality: Left;  . PANCREAS SURGERY  1967   1 cyst excised and one cyst drained  . VEIN LIGATION AND STRIPPING    . WRIST FRACTURE SURGERY       Allergies  Allergies  Allergen Reactions  . Latex Itching and Rash    History of Present Illness    67 year old female with the above past medical history including type 2 diabetes mellitus times approximately 30 years, hypertension, obesity, and depression.  She lives in [place] with her husband.  She suffered a left leg fracture 2 years ago and subsequently had issues with unsteady gait and falls.  She has been more or less wheelchair-bound since then and only walks on a limited basis, using a walker.  She has no prior cardiac history but does have a significant family history of stroke.  She was in her usual state of health  until proximally 3 weeks ago, when she began to experience progressive dyspnea on exertion, orthopnea, increasing abdominal girth, 20 pound weight gain, and lower extremity edema.  At no point was she experiencing chest pain or palpitations.  Due to progressive symptoms, she saw her primary care provider on Monday of this week was placed on Lasix 20 mg daily x3 days.  She did not notice much response to numbness in her weight continue to climb.  She contacted primary care on August 15 due to progressive symptoms of dyspnea and was advised to present to the emergency department.  Here, she was found to be in rapid atrial fibrillation at a rate of 137.  Chest x-ray showed cardiomegaly with vascular congestion and interstitial edema.  She was admitted and placed on heparin, IV diltiazem, and IV Lasix.  She has noted some symptomatic improvement in both dyspnea and lower extremity swelling.  She remains in atrial fibrillation in the 80s on 15 mg an hour of IV diltiazem.  Echocardiogram is pending.  Inpatient Medications    . buPROPion  300 mg Oral Daily  . cyanocobalamin  1,000 mcg Subcutaneous Once  . furosemide  40 mg Intravenous BID  . insulin aspart  0-9 Units Subcutaneous TID WC  . [START ON 04/03/2018] insulin glargine  14 Units Subcutaneous Daily  . loratadine  10 mg Oral Daily  . pneumococcal 23 valent vaccine  0.5 mL Intramuscular Tomorrow-1000  . tiotropium  18 mcg Inhalation Daily    Family History    Family History  Problem Relation Age of Onset  . Diabetes Mother        died in her 31's of a stroke  . Cancer Mother   . Stroke Mother   . Breast cancer Mother   . Diabetes Father        died in his 13's of a stroke.  . Stroke Father   . Diabetes Brother        died @ 66 of a stroke.  . Stroke Brother   . Diabetes Maternal Grandmother   . Diabetes Maternal Grandfather   . Diabetes Paternal Grandmother   . Diabetes Paternal Grandfather   . Breast cancer Maternal Aunt    She  indicated that her mother is deceased. She indicated that her father is deceased. She indicated that her brother is deceased. She indicated that the status of her maternal grandmother is unknown. She indicated that the status of her maternal grandfather is unknown. She indicated that the status of her paternal grandmother is unknown. She indicated that the status of her paternal grandfather is unknown. She indicated that the status of her maternal aunt is unknown.   Social History    Social History   Socioeconomic History  . Marital status: Married    Spouse name: Not on file  . Number of children: Not on file  . Years of education: Not on file  . Highest education level: Not on file  Occupational History  . Occupation: retired  Scientific laboratory technician  . Financial resource strain: Not on file  . Food insecurity:    Worry: Not on file    Inability: Not on file  . Transportation needs:    Medical: Not on file    Non-medical: Not on file  Tobacco Use  . Smoking status: Never Smoker  . Smokeless tobacco: Never Used  Substance and Sexual Activity  . Alcohol use: No  . Drug use: No  . Sexual activity: Not on file  Lifestyle  . Physical activity:    Days per week: Not on file    Minutes per session: Not on file  . Stress: Not on file  Relationships  . Social connections:    Talks on phone: Not on file    Gets together: Not on file    Attends religious service: Not on file    Active member of club or organization: Not on file    Attends meetings of clubs or organizations: Not on file    Relationship status: Not on file  . Intimate partner violence:    Fear of current or ex partner: Not on file    Emotionally abused: Not on file    Physically abused: Not on file    Forced sexual activity: Not on file  Other Topics Concern  . Not on file  Social History Narrative   Lives in [place] with husband.  More or less w/c bound since leg fx about 3 yrs ago.     Review of Systems      General:  No chills, fever, night sweats or weight changes.  Cardiovascular:  No chest pain, +++ dyspnea on exertion, +++ LE edema and inc abd girth, +++ orthopnea, no palpitations, paroxysmal nocturnal dyspnea. Dermatological: No rash, lesions/masses Respiratory: No cough, +++ dyspnea Urologic: No hematuria, dysuria Abdominal:   No nausea, vomiting, diarrhea, bright red blood per rectum, melena, or  hematemesis Neurologic:  No visual changes, wkns, changes in mental status. MSK: unsteady gait and generalized wkns - w/c bound x 2 yrs. All other systems reviewed and are otherwise negative except as noted above.  Physical Exam    Blood pressure 121/77, pulse 89, temperature (!) 97.4 F (36.3 C), temperature source Oral, resp. rate 19, height 5' 4"  (1.626 m), weight 121.2 kg, SpO2 98 %.  General: Pleasant, NAD Psych: Normal affect. Neuro: Alert and oriented X 3. Moves all extremities spontaneously. HEENT: Normal  Neck: Supple without bruits.  Obese, difficult to gauge JVP. Lungs:  Resp regular and unlabored, CTA. Heart: Irregularly irregular, no s3, s4, or murmurs. Abdomen: Obese, soft, non-tender, non-distended, BS + x 4.  Extremities: No clubbing, cyanosis.  2+ bilateral lower extremity edema. DP/PT/Radials 2+ and equal bilaterally.  Labs    Troponin [facility] of Care Test) Recent Labs    04/01/18 1708  TROPIPOC 0.02   Recent Labs    04/01/18 2245 04/02/18 0220 04/02/18 0952  TROPONINI <0.03 <0.03 <0.03   Lab Results  Component Value Date   WBC 8.1 04/02/2018   HGB 8.6 (L) 04/02/2018   HCT 27.3 (L) 04/02/2018   MCV 86.7 04/02/2018   PLT 302 04/02/2018    Recent Labs  Lab 04/02/18 0220  NA 140  K 3.9  CL 108  CO2 22  BUN 27*  CREATININE 1.79*  CALCIUM 8.1*  GLUCOSE 229*    Radiology Studies    Dg Chest 2 View  Result Date: 04/01/2018 CLINICAL DATA:  Shortness of breath EXAM: CHEST - 2 VIEW COMPARISON:  03/29/2018, 02/19/2016 FINDINGS: Mild cardiomegaly with  vascular congestion and mild diffuse interstitial opacities suggesting minimal edema. No pleural effusion. Possible small anterior lower lobe infiltrate on the lateral view. Aortic atherosclerosis. No pneumothorax. IMPRESSION: 1. Cardiomegaly with vascular congestion and mild interstitial edema 2. Possible small anterior lung base infiltrate on the lateral view. Electronically Signed   By: Donavan Foil M.D.   On: 04/01/2018 18:48   Dg Chest 2 View  Result Date: 03/29/2018 CLINICAL DATA:  Acute shortness of breath with cough for 1 week. EXAM: CHEST - 2 VIEW COMPARISON:  02/19/2016 FINDINGS: Cardiomegaly and pulmonary vascular congestion noted. There is no evidence of focal airspace disease,, pulmonary edema, suspicious pulmonary nodule/mass, pleural effusion, or pneumothorax. No acute bony abnormalities are identified. IMPRESSION: Cardiomegaly with pulmonary vascular congestion. Electronically Signed   By: Margarette Canada M.D.   On: 03/29/2018 14:16    ECG & Cardiac Imaging    Afib, 137, nonspecific t changes.   Tele: afib, 80's  Assessment & Plan    1.  Atrial fibrillation with rapid ventricular response: Patient presented with a 3-week history of progressive dyspnea on exertion along with orthopnea, edema, weight gain, and increasing abdominal girth.  She was not having any palpitations.  She saw her primary care earlier this week and was placed on Lasix.  Patient is not aware of her heart rate was elevated that day.  Regardless, she presented to the ED on August 15 secondary to progressive symptoms.  She was found to be in rapid atrial fibrillation.  She is currently anticoagulated with heparin and on diltiazem infusion at 15 mg an hour.  Heart rates currently in the mid 80s.  CHA2DS2VASc equals 5.  Hemodynamically stable.  I will add oral beta-blocker therapy to see if we can improve rate control.  Echocardiogram pending.  Pending echo, we can likely plan to transition to oral Eliquis as she  will  hopefully not require any invasive procedures.  She remains in A. fib over the weekend, we can consider TEE and cardioversion early next week.  2.  Acute congestive heart failure: Question systolic versus diastolic.  Echocardiogram currently pending.  She remains markedly volume overloaded on exam but is responding to Lasix and has noted some reduction in lower extremity edema and abdominal girth.  Continue diuresis and follow I's and O's and daily weights closely.  Creatinine was 1.86 on admission and 1.79 this morning.  Follow closely.  3.  Essential hypertension: Stable.  Was on benazepril at home however this is currently on hold in the setting of elevated creatinine of unknown chronicity.  4.  Type 2 diabetes mellitus: Per internal medicine.  Followed closely as an outpatient by primary care.  5.  Lipid status: Unknown.  Will follow up and in the setting of diabetes, will add statin.  6.  Renal insufficiency: Creatinine 1.79 this morning.  Chronicity unknown.  ACE inhibitor on hold.  Follow with diuresis.  7.  Normocytic anemia: Hemoglobin 8.6 with hematocrit of 27.3.  Chronicity unknown.  Follow in the setting of anticoagulation.  Signed, Murray Hodgkins, NP 04/02/2018, 5:01 PM  For questions or updates, please contact   Please consult [email or web address] for contact info under Cardiology/STEMI.  I have seen and examined the patient along with Murray Hodgkins, NP, PA NP.  I have reviewed the chart, notes and new data.  I agree with PA/NP's note.  Key new complaints: Symptoms of orthopnea and paroxysmal nocturnal dyspnea have been going on for about 2 weeks.  The onset was very abrupt.  She was never aware of palpitations.  She has not had chest pain.  She only sought medical attention after she also developed lower extremity edema. Key examination changes: Irregular rhythm, obesity limits the exam but I think she still has jugular venous distention, 1+ ankle edema. Key new findings /  data: Moderate anemia, with a normocytic indices but with some values that suggest iron deficiency.  Stage III renal insufficiency, unknown chronicity.  Echo shows normal left ventricular systolic function and only mild biatrial dilation.  There is significant mitral any calcification with some degree of mitral stenosis that appears to probably be mild.  - Left ventricle: The cavity size was normal. Wall thickness was   increased in a pattern of mild LVH. Systolic function was normal.   The estimated ejection fraction was in the range of 55% to 60%.   Although no diagnostic regional wall motion abnormality was   identified, this possibility cannot be completely excluded on the   basis of this study. The study was not technically sufficient to   allow evaluation of LV diastolic dysfunction due to atrial   fibrillation. - Aortic valve: There was no stenosis. - Mitral valve: Moderately to severely calcified annulus. Mildly   calcified leaflets . Elevated mean gradient across mitral but   valve area by pressure half-time was normal. Probably no   significant mitral stenosis. There was trivial regurgitation.   Mean gradient (D): 5 mm Hg. Valve area by pressure half-time:   2.58 cm^2. - Left atrium: The atrium was mildly dilated. - Right ventricle: The cavity size was normal. Systolic function   was mildly reduced. - Right atrium: The atrium was mildly dilated. - Tricuspid valve: There was moderate regurgitation. Peak RV-RA   gradient (S): 31 mm Hg. - Pulmonary arteries: PA peak pressure: 46 mm Hg (S). - Systemic veins: IVC measured 2.5  cm with < 50% respirophasic   variation, suggesting RA pressure 15 mmHg.  Impressions:  - The patient was in atrial fibrillation. Normal LV size with mild   LV hypertrophy. EF 55-60%. Normal RV size with mildly decreased   systolic function. Moderate pulmonary hypertension. Calcified   mitral valve and annulus, elevated mean gradient but normal   pressure  half-time, probably no significant stenosis. Biatrial   enlargement. Dilated IVC suggestive of elevated RV filling   pressure.  PLAN: She is feeling much better with rate control on intravenous diltiazem.  Plan to gradually switch to oral metoprolol. She does not have valvular atrial fibrillation.  Will initiate oral anticoagulation.  Watch hemoglobin over the weekend. Atrial fibrillation seemed to lead to rapid decompensation and its likely that she has substantial underlying diastolic dysfunction.  Since she only has mild atrial dilation is worth an effort to restore normal rhythm.  We will plan TEE guided cardioversion on Monday. These procedures have been fully reviewed with the patient and informed consent has been obtained. I will continue treatment with diuretics and monitor renal parameters and hemoglobin over the weekend.   Sanda Klein, MD, [place] [phone] 04/02/2018, 5:49 PM

## 2018-04-02 NOTE — Progress Notes (Signed)
  Echocardiogram 2D Echocardiogram has been performed.  [name] M 04/02/2018, 2:55 PM

## 2018-04-02 NOTE — Progress Notes (Signed)
Pt complaining of nasal congestion and not being able to breathe. [facility] paged and paged returned. Mucinex and flonase ordered for the pt. Pt O2 sat on room air at 97%, vitals are also stable without labored breathing. Pt's HR is also in the mid 50's on cardizem gtt. Per [facility] I turned the cardizem gtt down to 29m/hr from 119mhr because they did not want to cut off the gtt completely. I changed the rate from 15 to [id number] minutes later I turned the gtt off bc the pt's HR was in the low 50's. Cardiology paged to inform them that the cardizem gtt was turned off. Pt also complaining of feeling like her entire bed is spinning. Pt thinks it may be related to the trazodone the pt took to help her sleep since she asked for something to help her sleep. Pt states in the future she does not want to take anymore trazodone.

## 2018-04-02 NOTE — Progress Notes (Signed)
[place] for Heparin Indication: atrial fibrillation  Allergies  Allergen Reactions  . Latex Itching and Rash    Patient Measurements: Height: 5' 4"  (162.6 cm) Weight: 267 lb (121.1 kg) IBW/kg (Calculated) : 54.7 Heparin Dosing Weight: 84.2  Vital Signs: Temp: 98.1 F (36.7 C) (08/16 0033) Temp Source: Oral (08/16 0033) BP: 106/49 (08/16 0255) Pulse Rate: 116 (08/16 0212)  Labs: Recent Labs    04/01/18 1653 04/01/18 2209 04/01/18 2245 04/02/18 0220  HGB 9.5*  --   --   --   HCT 36.0  --   --   --   PLT 191  --   --   --   APTT  --  33  --   --   HEPARINUNFRC  --   --   --  0.25*  CREATININE 1.86*  --   --   --   TROPONINI  --   --  <0.03  --     Estimated Creatinine Clearance: 37.7 mL/min (A) (by C-G formula based on SCr of 1.86 mg/dL (H)).   Medical History: Past Medical History:  Diagnosis Date  . Depression   . Diabetes mellitus without complication ([place])   . Hypertension   . Osteoporosis   . UTI (lower urinary tract infection) 01/2016   Cipro for Klebsiella pneumoniae isolate  . UTI (lower urinary tract infection) 02/19/2016   Klebsiella    Medications:  Scheduled:  . buPROPion  300 mg Oral Daily  . furosemide  40 mg Intravenous BID  . insulin aspart  0-9 Units Subcutaneous TID WC  . pneumococcal 23 valent vaccine  0.5 mL Intramuscular Tomorrow-1000  . tiotropium  18 mcg Inhalation Daily   Infusions:  . diltiazem (CARDIZEM) infusion 15 mg/hr (04/02/18 0214)  . heparin 1,200 Units/hr (04/01/18 2234)  . magnesium sulfate 1 - 4 g bolus IVPB 4 g (04/02/18 0249)   PRN:   Assessment: 67 yo F presenting with shortness of breath, weight gain w/ new onset afib. Patient was not taking any anticoagulation medications PTA.   Hgb 9.8, plt 191 - no signs or symptoms of bleeding noted  8/16 AM update: heparin level just below goal this AM, no issues per RN.   Goal of Therapy:  Heparin level 0.3-0.7  units/ml Monitor platelets by anticoagulation protocol: Yes   Plan:  -Inc heparin to 1350 units/hr -[place], PharmD, BCPS Clinical Pharmacist Phone: [phone]

## 2018-04-02 NOTE — Progress Notes (Signed)
[place] for Heparin Indication: atrial fibrillation  Allergies  Allergen Reactions  . Latex Itching and Rash    Patient Measurements: Height: 5' 4"  (162.6 cm) Weight: 267 lb 3.2 oz (121.2 kg) IBW/kg (Calculated) : 54.7 Heparin Dosing Weight: 84.2  Vital Signs: Temp: 98 F (36.7 C) (08/16 0750) Temp Source: Oral (08/16 0750) BP: 109/56 (08/16 0750) Pulse Rate: 78 (08/16 0750)  Labs: Recent Labs    04/01/18 1653 04/01/18 2209 04/01/18 2245 04/02/18 0220 04/02/18 0428 04/02/18 1007  HGB 9.5*  --   --   --  8.6*  --   HCT 36.0  --   --   --  27.3*  --   PLT 191  --   --   --  302  --   APTT  --  33  --   --   --   --   HEPARINUNFRC  --   --   --  0.25*  --  0.40  CREATININE 1.86*  --   --  1.79*  --   --   TROPONINI  --   --  <0.03 <0.03  --   --     Estimated Creatinine Clearance: 39.1 mL/min (A) (by C-G formula based on SCr of 1.79 mg/dL (H)).   Medical History: Past Medical History:  Diagnosis Date  . Depression   . Diabetes mellitus without complication ([place])   . Hypertension   . Osteoporosis   . UTI (lower urinary tract infection) 01/2016   Cipro for Klebsiella pneumoniae isolate  . UTI (lower urinary tract infection) 02/19/2016   Klebsiella    Medications:  Scheduled:  . buPROPion  300 mg Oral Daily  . furosemide  40 mg Intravenous BID  . insulin aspart  0-9 Units Subcutaneous TID WC  . insulin glargine  10 Units Subcutaneous Daily  . loratadine  10 mg Oral Daily  . pneumococcal 23 valent vaccine  0.5 mL Intramuscular Tomorrow-1000  . tiotropium  18 mcg Inhalation Daily   Infusions:  . diltiazem (CARDIZEM) infusion 15 mg/hr (04/02/18 0919)  . heparin 1,350 Units/hr (04/02/18 0301)  . magnesium sulfate 1 - 4 g bolus IVPB     Assessment: 67 yo F presenting with shortness of breath, weight gain w/ new onset afib. Patient was not taking any anticoagulation medications PTA.   Heparin level came back  therapeutic at 0.4, on 1350 units/hr following rate increase. Hgb 8.6, plt 302. No s/sx of bleeding per nursing. No infusion issues.  Goal of Therapy:  Heparin level 0.3-0.7 units/ml Monitor platelets by anticoagulation protocol: Yes   Plan:  -Continue heparin infusion at 1350 units/hr -Monitor daily HL, CBC, and for s/sx of bleeding  Doylene Canard, PharmD Clinical Pharmacist  Pager: [phone] Phone: [phone]

## 2018-04-02 NOTE — Progress Notes (Signed)
Inpatient Diabetes Program Recommendations  AACE/ADA: New Consensus Statement on Inpatient Glycemic Control (2015)  Target Ranges:  Prepandial:   less than 140 mg/dL      Peak postprandial:   less than 180 mg/dL (1-2 hours)      Critically ill patients:  140 - 180 mg/dL   Lab Results  Component Value Date   GLUCAP 218 (H) 04/02/2018   HGBA1C 7.0 (H) 02/27/2016    Review of Glycemic ControlResults for CATHYE, KREITER (MRN [id number]) as of 04/02/2018 12:59  Ref. Range 04/01/2018 16:53 04/02/2018 02:20  Glucose Latest Ref Range: 70 - 99 mg/dL 239 (H) 229 (H)    Diabetes history: Type 2 DM  Outpatient Diabetes medications: Actos 45 mg daily, Metformin 1000 mg daily, Glucotrol 10 mg bid Current orders for Inpatient glycemic control: Lantus 10 units daily, Novolog sensitive tid with meals Inpatient Diabetes Program Recommendations:   Note Lantus 10 units added today.  Consider d/c of Actos at discharge due to history of CHF. Will follow.  Thanks,  Adah Perl, RN, BC-ADM Inpatient Diabetes Coordinator Pager [phone] (8a-5p)

## 2018-04-03 LAB — CBC
HCT: 29.2 % — ABNORMAL LOW (ref 36.0–46.0)
Hemoglobin: 8.7 g/dL — ABNORMAL LOW (ref 12.0–15.0)
MCH: 26.8 pg (ref 26.0–34.0)
MCHC: 29.8 g/dL — ABNORMAL LOW (ref 30.0–36.0)
MCV: 89.8 fL (ref 78.0–100.0)
Platelets: 300 10*3/uL (ref 150–400)
RBC: 3.25 MIL/uL — ABNORMAL LOW (ref 3.87–5.11)
RDW: 15.3 % (ref 11.5–15.5)
WBC: 8 10*3/uL (ref 4.0–10.5)

## 2018-04-03 LAB — COMPREHENSIVE METABOLIC PANEL
ALBUMIN: 2.7 g/dL — AB (ref 3.5–5.0)
ALT: 15 U/L (ref 0–44)
ANION GAP: 13 (ref 5–15)
AST: 13 U/L — ABNORMAL LOW (ref 15–41)
Alkaline Phosphatase: 68 U/L (ref 38–126)
BUN: 30 mg/dL — ABNORMAL HIGH (ref 8–23)
CHLORIDE: 104 mmol/L (ref 98–111)
CO2: 22 mmol/L (ref 22–32)
Calcium: 8.4 mg/dL — ABNORMAL LOW (ref 8.9–10.3)
Creatinine, Ser: 2.47 mg/dL — ABNORMAL HIGH (ref 0.44–1.00)
GFR calc Af Amer: 22 mL/min — ABNORMAL LOW (ref >60)
GFR calc non Af Amer: 19 mL/min — ABNORMAL LOW (ref >60)
GLUCOSE: 186 mg/dL — AB (ref 70–99)
POTASSIUM: 4.8 mmol/L (ref 3.5–5.1)
SODIUM: 139 mmol/L (ref 135–145)
Total Bilirubin: 0.7 mg/dL (ref 0.3–1.2)
Total Protein: 5.9 g/dL — ABNORMAL LOW (ref 6.5–8.1)

## 2018-04-03 LAB — GLUCOSE, CAPILLARY
GLUCOSE-CAPILLARY: 146 mg/dL — AB (ref 70–99)
Glucose-Capillary: 176 mg/dL — ABNORMAL HIGH (ref 70–99)
Glucose-Capillary: 95 mg/dL (ref 70–99)

## 2018-04-03 LAB — OCCULT BLOOD X 1 CARD TO LAB, STOOL: Fecal Occult Bld: NEGATIVE

## 2018-04-03 LAB — MAGNESIUM: Magnesium: 2.1 mg/dL (ref 1.7–2.4)

## 2018-04-03 MED ORDER — SODIUM CHLORIDE 0.9 % IV BOLUS
250.0000 mL | Freq: Once | INTRAVENOUS | Status: AC
  Administered 2018-04-03: 250 mL via INTRAVENOUS

## 2018-04-03 MED ORDER — FUROSEMIDE 10 MG/ML IJ SOLN
40.0000 mg | Freq: Every day | INTRAMUSCULAR | Status: DC
  Administered 2018-04-03: 40 mg via INTRAVENOUS

## 2018-04-03 MED ORDER — INSULIN GLARGINE 100 UNIT/ML ~~LOC~~ SOLN
18.0000 [IU] | Freq: Every day | SUBCUTANEOUS | Status: DC
Start: 2018-04-04 — End: 2018-04-08
  Administered 2018-04-04 – 2018-04-08 (×4): 18 [IU] via SUBCUTANEOUS
  Filled 2018-04-03 (×5): qty 0.18

## 2018-04-03 NOTE — Progress Notes (Signed)
Progress Note  Patient Name: [name] Date of Encounter: 04/03/2018  Primary Cardiologist: No primary care provider on file.   Subjective   Patient reports a difficult night. She has been having difficulty sleeping, and she asked for a medication for sleep. She was given trazodone, and she endorsed strange feelings, feeling disconnected from her body. She also notes congestion, for which she is taking mucinex and flonase, and some nausea, which she thinks might be because she was hungry. She is off the cardizem drip and maintaining heart rates in the upper 50s. She is still amenable to TEE/CV on Monday.  Inpatient Medications    Scheduled Meds: . apixaban  5 mg Oral BID  . buPROPion  300 mg Oral Daily  . fluticasone  2 spray Each Nare Daily  . furosemide  40 mg Intravenous BID  . insulin aspart  0-9 Units Subcutaneous TID WC  . insulin glargine  14 Units Subcutaneous Daily  . loratadine  10 mg Oral Daily  . metoprolol tartrate  25 mg Oral BID  . pneumococcal 23 valent vaccine  0.5 mL Intramuscular Tomorrow-1000  . tiotropium  18 mcg Inhalation Daily   Continuous Infusions: . diltiazem (CARDIZEM) infusion Stopped (04/02/18 2241)  . magnesium sulfate 1 - 4 g bolus IVPB     PRN Meds: acetaminophen **OR** [DISCONTINUED] acetaminophen, guaiFENesin, ondansetron **OR** ondansetron (ZOFRAN) IV   Vital Signs    Vitals:   04/03/18 0050 04/03/18 0539 04/03/18 0540 04/03/18 0733  BP: (!) 101/57 (!) 107/46  (!) 96/43  Pulse: (!) 53 (!) 59  (!) 55  Resp:      Temp:  98.3 F (36.8 C)  (!) 97.5 F (36.4 C)  TempSrc:  Oral  Oral  SpO2:  98%  98%  Weight:   121.9 kg   Height:        Intake/Output Summary (Last 24 hours) at 04/03/2018 0918 Last data filed at 04/03/2018 0537 Gross per 24 hour  Intake 740.55 ml  Output 500 ml  Net 240.55 ml   Filed Weights   04/01/18 1633 04/02/18 0352 04/03/18 0540  Weight: 121.1 kg 121.2 kg 121.9 kg    Telemetry    Atrial  fibrillation, HR in the 50s. - Personally Reviewed  ECG    From 8/15 afib, RVR - Personally Reviewed  Physical Exam   GEN: No acute distress.  Sitting comfortably in bed eating breakfast Neck: supple, difficult to assess JVD Cardiac: irregular S1 and S2, no murmurs, rubs, or gallops.  Respiratory: Clear to auscultation bilaterally. GI: Soft, nontender, non-distended. Bowel sounds normal. Area on right lower quandrant (she endorses site of prior drain) with circular area with scab. No erythema, oozing, warmth. MS: 1+ bilateral LE edema; No deformity. Neuro:  Nonfocal, moves all limbs independently Psych: Normal affect   Labs    Chemistry Recent Labs  Lab 04/01/18 1653 04/02/18 0220 04/03/18 0459  NA 141 140 139  K 4.2 3.9 4.8  CL 107 108 104  CO2 20* 22 22  GLUCOSE 239* 229* 186*  BUN 26* 27* 30*  CREATININE 1.86* 1.79* 2.47*  CALCIUM 8.9 8.1* 8.4*  PROT  --   --  5.9*  ALBUMIN  --   --  2.7*  AST  --   --  13*  ALT  --   --  15  ALKPHOS  --   --  68  BILITOT  --   --  0.7  GFRNONAA 27* 28* 19*  GFRAA  31* 33* 22*  ANIONGAP 14 10 13      Hematology Recent Labs  Lab 04/01/18 1653 04/02/18 0220 04/02/18 0428 04/03/18 0459  WBC 5.8  --  8.1 8.0  RBC 3.55* 3.07* 3.15* 3.25*  HGB 9.5*  --  8.6* 8.7*  HCT 36.0  --  27.3* 29.2*  MCV 101.4*  --  86.7 89.8  MCH 26.8  --  27.3 26.8  MCHC 26.4*  --  31.5 29.8*  RDW 15.4  --  15.2 15.3  PLT 191  --  302 300    Cardiac Enzymes Recent Labs  Lab 04/01/18 2245 04/02/18 0220 04/02/18 0952  TROPONINI <0.03 <0.03 <0.03    Recent Labs  Lab 04/01/18 1708  TROPIPOC 0.02     BNPNo results for input(s): BNP, PROBNP in the last 168 hours.   DDimer No results for input(s): DDIMER in the last 168 hours.   Radiology    Dg Chest 2 View  Result Date: 04/01/2018 CLINICAL DATA:  Shortness of breath EXAM: CHEST - 2 VIEW COMPARISON:  03/29/2018, 02/19/2016 FINDINGS: Mild cardiomegaly with vascular congestion and mild  diffuse interstitial opacities suggesting minimal edema. No pleural effusion. Possible small anterior lower lobe infiltrate on the lateral view. Aortic atherosclerosis. No pneumothorax. IMPRESSION: 1. Cardiomegaly with vascular congestion and mild interstitial edema 2. Possible small anterior lung base infiltrate on the lateral view. Electronically Signed   By: Donavan Foil M.D.   On: 04/01/2018 18:48    Cardiac Studies   Echo 8.16.19 Left ventricle: The cavity size was normal. Wall thickness was   increased in a pattern of mild LVH. Systolic function was normal.   The estimated ejection fraction was in the range of 55% to 60%.   Although no diagnostic regional wall motion abnormality was   identified, this possibility cannot be completely excluded on the   basis of this study. The study was not technically sufficient to   allow evaluation of LV diastolic dysfunction due to atrial   fibrillation. - Aortic valve: There was no stenosis. - Mitral valve: Moderately to severely calcified annulus. Mildly   calcified leaflets . Elevated mean gradient across mitral but   valve area by pressure half-time was normal. Probably no   significant mitral stenosis. There was trivial regurgitation.   Mean gradient (D): 5 mm Hg. Valve area by pressure half-time:   2.58 cm^2. - Left atrium: The atrium was mildly dilated. - Right ventricle: The cavity size was normal. Systolic function   was mildly reduced. - Right atrium: The atrium was mildly dilated. - Tricuspid valve: There was moderate regurgitation. Peak RV-RA   gradient (S): 31 mm Hg. - Pulmonary arteries: PA peak pressure: 46 mm Hg (S). - Systemic veins: IVC measured 2.5 cm with < 50% respirophasic   variation, suggesting RA pressure 15 mmHg.  Impressions:  - The patient was in atrial fibrillation. Normal LV size with mild   LV hypertrophy. EF 55-60%. Normal RV size with mildly decreased   systolic function. Moderate pulmonary hypertension.  Calcified   mitral valve and annulus, elevated mean gradient but normal   pressure half-time, probably no significant stenosis. Biatrial   enlargement. Dilated IVC suggestive of elevated RV filling   pressure.  Patient Profile     67 y.o. female with three week history of DOE, orthopnea, weight gain, increasing abdominal girth but no palpitations or chest pain who presented to ER with progressive symptoms and was found to be in afib RVR.   Assessment &  Plan    Atrial fibrillation: now off diltiazem drip, on metoprolol. Rate controlled in the high 50s.  Will monitor HR--expect that it might rise with activity, etc during the day but if she remains in the 50s may need to cut back on the metoprolol.  -continue apixaban, metoprolol 25 mg BID. -if she remains in afib, will plan for TEE guided cardioversion on Monday. Patient amenable -CHADSVASC:  CHA2DS2/VAS Stroke Risk Points  Current as of 57 minutes ago     5 >= 2 Points: High Risk  1 - 1.99 Points: Medium Risk  0 Points: Low Risk     Points Metrics  1 Has Congestive Heart Failure:  Yes   0 Has Vascular Disease:  No   1 Has Hypertension:  Yes   1 Age:  67   1 Has Diabetes:  Yes   0 Had Stroke:  No  Had TIA:  No  Had thromboembolism:  No   1 Female:  Yes    Acute diastolic heart failure: echo with normal EF. Likely tachycardia mediated. Ins and outs as well as daily weights suggest that she is near even. Does report improved symptoms and swelling, however. Creatinine has also risen. Difficult to assess JVD but lungs are clear.  -Will scale back from BID to daily lasix dosing, monitor output and renal function.  Hypertension: holding ACEI in the setting of AKI on CKD  Type II diabetes: she had questions as to why she was on insulin. Discussed hospital protocol, she understands. Being managed by primary team.   Lipids: checking in AM   Normocytic anemia: stable. No clear bleeding source. May be anemia of chronic disease. Will defer  workup to primary team, but if there is evidence of bleeding will need to assess given her CHADSVASC risk and need for anticoagulation.     Time Spent Directly with Patient: I have spent a total of 25 minutes with the patient reviewing hospital notes, telemetry, EKGs, labs and examining the patient as well as establishing an assessment and plan that was discussed personally with the patient.  > 50% of time was spent in direct patient care.  Length of Stay:  LOS: 2 days   Buford Dresser, MD, PhD [facility]   04/03/2018, 9:18 AM      For questions or updates, please contact [place] Please consult [email or web address] for contact info under Cardiology/STEMI.

## 2018-04-03 NOTE — Plan of Care (Signed)
Urine Malodorous. Some confusion overnight per RN shift report. Trazodone was new as well last night. MD notified.

## 2018-04-03 NOTE — Progress Notes (Addendum)
[place] TEAM 1 - Stepdown/ICU TEAM  LAURAJEAN HOSEK  GEZ:[id number] DOB: 02/16/1951 DOA: 04/01/2018 PCP: [facility]    Brief Narrative:  67 y.o. female with a hx of HTN and DM2 who c/o  SOB w/ exertion and lying down over the 3 weeks.  Denied palpitations or chest pain. Noticed increasing lower extremity edema not responsive to lasix newly started by her PCP.   In the ER she was found to be in A. fib with RVR.  On exam patient had elevated JVD and bilateral lower extremity edema.    Significant Events: 8/15 admit  Subjective: Patient reported insomnia last night followed by hallucinations after taking trazodone for the first time.  Today she reports some ongoing nausea and poor appetite and she has developed malodorous urine.  She denies chest pain or shortness of breath.  She does feel that she is developed a little more lower extremity edema today.  Assessment & Plan:  Afib w/ RVR - newly diagnosed CHA2DS2-VASc score is at least 4 - rate currently controlled - TSH normal - cont Eliquis and now BB - maximize electrolytes - Cards planning for TEE DCCV Monday if remains in Afib   CHF - unable to classify further at this time EF preserved on TTE but unable to comment on diastolic dysfunction - no gross volume overload on physical exam  Filed Weights   04/01/18 1633 04/02/18 0352 04/03/18 0540  Weight: 121.1 kg 121.2 kg 121.9 kg    Hypomagnesemia  Corrected to goal of 2.0 or greater  Acute renal failure  crt has worsened again over the last 24hrs - perhaps poor CO in setting of ACEi is to blame - off ACEi - hold on further diuresis and give small bolus   Recent Labs  Lab 04/01/18 1653 04/02/18 0220 04/03/18 0459  CREATININE 1.86* 1.79* 2.47*    HTN Well controlled at this time   DM2 CBG not yet at goal, but not markedly elevated - gently adjust tx and follow   Chronic Normocytic anemia Fe studies suggestive of ACD type picture - follow -  baseline Hgb appears to be 8.5-9.5  Recent Labs  Lab 04/01/18 1653 04/02/18 0428 04/03/18 0459  HGB 9.5* 8.6* 8.7*    Morbid obesity - Body mass index is 46.13 kg/m.   Malodorous urine  Pt reports frequent UTIs - UA and culture sent    DVT prophylaxis: Eliquis  Code Status: FULL CODE Family Communication: no family present at time of exam  Disposition Plan:   Consultants:  none  Antimicrobials:  none   Objective: Blood pressure (!) 95/45, pulse 60, temperature (!) 97.5 F (36.4 C), temperature source Oral, resp. rate 18, height 5' 4"  (1.626 m), weight 121.9 kg, SpO2 98 %.  Intake/Output Summary (Last 24 hours) at 04/03/2018 1543 Last data filed at 04/03/2018 0537 Gross per 24 hour  Intake -  Output 500 ml  Net -500 ml   Filed Weights   04/01/18 1633 04/02/18 0352 04/03/18 0540  Weight: 121.1 kg 121.2 kg 121.9 kg    Examination: General: No acute respiratory distress Lungs: CTA B - no wheezing  Cardiovascular: Irreg irreg - no gallup or rub Abdomen: NT/ND, soft, BS+, no mass  Extremities: 1+ B LE edema   CBC: Recent Labs  Lab 04/01/18 1653 04/02/18 0428 04/03/18 0459  WBC 5.8 8.1 8.0  HGB 9.5* 8.6* 8.7*  HCT 36.0 27.3* 29.2*  MCV 101.4* 86.7 89.8  PLT 191 302 300  Basic Metabolic Panel: Recent Labs  Lab 04/01/18 1653 04/01/18 2245 04/02/18 0220 04/02/18 0952 04/03/18 0459  NA 141  --  140  --  139  K 4.2  --  3.9  --  4.8  CL 107  --  108  --  104  CO2 20*  --  22  --  22  GLUCOSE 239*  --  229*  --  186*  BUN 26*  --  27*  --  30*  CREATININE 1.86*  --  1.79*  --  2.47*  CALCIUM 8.9  --  8.1*  --  8.4*  MG  --  1.2*  --  2.0 2.1   GFR: Estimated Creatinine Clearance: 28.5 mL/min (A) (by C-G formula based on SCr of 2.47 mg/dL (H)).  Liver Function Tests: Recent Labs  Lab 04/03/18 0459  AST 13*  ALT 15  ALKPHOS 68  BILITOT 0.7  PROT 5.9*  ALBUMIN 2.7*    Cardiac Enzymes: Recent Labs  Lab 04/01/18 2245 04/02/18 0220  04/02/18 0952  TROPONINI <0.03 <0.03 <0.03    HbA1C: Hgb A1c MFr Bld  Date/Time Value Ref Range Status  02/27/2016 07:20 AM 7.0 (H) 4.8 - 5.6 % Final    Comment:    (NOTE)         Pre-diabetes: 5.7 - 6.4         Diabetes: >6.4         Glycemic control for adults with diabetes: <7.0   11/11/2007 08:05 AM (H)  Final   9.0 (NOTE)   The ADA recommends the following therapeutic goals for glycemic   control related to Hgb A1C measurement:   Goal of Therapy:   < 7.0% Hgb A1C   Action Suggested:  > 8.0% Hgb A1C   Ref:  Diabetes Care, 22, Suppl. 1, 1999    CBG: Recent Labs  Lab 04/02/18 0749 04/02/18 1112 04/02/18 1635 04/02/18 2046 04/03/18 0730  GLUCAP 184* 218* 182* 194* 176*    Recent Results (from the past 240 hour(s))  MRSA PCR Screening     Status: None   Collection Time: 04/02/18  3:53 AM  Result Value Ref Range Status   MRSA by PCR NEGATIVE NEGATIVE Final    Comment:        The GeneXpert MRSA Assay (FDA approved for NASAL specimens only), is one component of a comprehensive MRSA colonization surveillance program. It is not intended to diagnose MRSA infection nor to guide or monitor treatment for MRSA infections. Performed at [facility], [place] [address], [place], [place] [address]      Scheduled Meds: . apixaban  5 mg Oral BID  . buPROPion  300 mg Oral Daily  . fluticasone  2 spray Each Nare Daily  . furosemide  40 mg Intravenous Daily  . insulin aspart  0-9 Units Subcutaneous TID WC  . insulin glargine  14 Units Subcutaneous Daily  . loratadine  10 mg Oral Daily  . metoprolol tartrate  25 mg Oral BID  . pneumococcal 23 valent vaccine  0.5 mL Intramuscular Tomorrow-1000  . tiotropium  18 mcg Inhalation Daily     LOS: 2 days   Cherene Altes, MD [facility] Office  [phone] Pager - Text Page per Amion  If 7PM-7AM, please contact night-coverage per Amion 04/03/2018, 3:43 PM

## 2018-04-04 LAB — CBC
HCT: 28.7 % — ABNORMAL LOW (ref 36.0–46.0)
Hemoglobin: 8.5 g/dL — ABNORMAL LOW (ref 12.0–15.0)
MCH: 26.6 pg (ref 26.0–34.0)
MCHC: 29.6 g/dL — ABNORMAL LOW (ref 30.0–36.0)
MCV: 90 fL (ref 78.0–100.0)
Platelets: 291 10*3/uL (ref 150–400)
RBC: 3.19 MIL/uL — ABNORMAL LOW (ref 3.87–5.11)
RDW: 15.3 % (ref 11.5–15.5)
WBC: 8 10*3/uL (ref 4.0–10.5)

## 2018-04-04 LAB — URINALYSIS, ROUTINE W REFLEX MICROSCOPIC
Bilirubin Urine: NEGATIVE
GLUCOSE, UA: NEGATIVE mg/dL
Ketones, ur: NEGATIVE mg/dL
Nitrite: NEGATIVE
Protein, ur: 100 mg/dL — AB
Specific Gravity, Urine: 1.017 (ref 1.005–1.030)
pH: 6 (ref 5.0–8.0)

## 2018-04-04 LAB — COMPREHENSIVE METABOLIC PANEL
ALT: 12 U/L (ref 0–44)
ANION GAP: 11 (ref 5–15)
AST: 11 U/L — ABNORMAL LOW (ref 15–41)
Albumin: 2.6 g/dL — ABNORMAL LOW (ref 3.5–5.0)
Alkaline Phosphatase: 63 U/L (ref 38–126)
BUN: 37 mg/dL — ABNORMAL HIGH (ref 8–23)
CHLORIDE: 107 mmol/L (ref 98–111)
CO2: 22 mmol/L (ref 22–32)
CREATININE: 2.74 mg/dL — AB (ref 0.44–1.00)
Calcium: 8.2 mg/dL — ABNORMAL LOW (ref 8.9–10.3)
GFR, EST AFRICAN AMERICAN: 20 mL/min — AB (ref >60)
GFR, EST NON AFRICAN AMERICAN: 17 mL/min — AB (ref >60)
Glucose, Bld: 86 mg/dL (ref 70–99)
POTASSIUM: 4.5 mmol/L (ref 3.5–5.1)
Sodium: 140 mmol/L (ref 135–145)
Total Bilirubin: 0.5 mg/dL (ref 0.3–1.2)
Total Protein: 5.5 g/dL — ABNORMAL LOW (ref 6.5–8.1)

## 2018-04-04 LAB — LIPID PANEL
Cholesterol: 98 mg/dL (ref 0–200)
HDL: 50 mg/dL (ref >40)
LDL CALC: 30 mg/dL (ref 0–99)
Total CHOL/HDL Ratio: 2 RATIO
Triglycerides: 90 mg/dL (ref <150)
VLDL: 18 mg/dL (ref 0–40)

## 2018-04-04 LAB — GLUCOSE, CAPILLARY
GLUCOSE-CAPILLARY: 96 mg/dL (ref 70–99)
GLUCOSE-CAPILLARY: 132 mg/dL — AB (ref 70–99)
GLUCOSE-CAPILLARY: 217 mg/dL — AB (ref 70–99)
GLUCOSE-CAPILLARY: 205 mg/dL — AB (ref 70–99)

## 2018-04-04 MED ORDER — SODIUM CHLORIDE 0.9 % IV SOLN: INTRAVENOUS | Status: DC

## 2018-04-04 MED ORDER — SODIUM CHLORIDE 0.9 % IV SOLN
1.0000 g | INTRAVENOUS | Status: DC
  Administered 2018-04-04 – 2018-04-06 (×3): 1 g via INTRAVENOUS
  Filled 2018-04-04 (×3): qty 10

## 2018-04-04 NOTE — Progress Notes (Signed)
[place] TEAM 1 - Stepdown/ICU TEAM  ARNESIA VINCELETTE  TKP:[id number] DOB: [birth year] DOA: 04/01/2018 PCP: [facility]    Brief Narrative:  67 y.o. female with a hx of HTN and DM2 who c/o  SOB w/ exertion and lying down over the 3 weeks.  Denied palpitations or chest pain. Noticed increasing lower extremity edema not responsive to lasix newly started by her PCP.   In the ER she was found to be in A. fib with RVR.  On exam patient had elevated JVD and bilateral lower extremity edema.    Significant Events: 8/15 admit  Subjective: The patient is resting comfortably in bed.  She reports some mild wheezing that she is noted today but states it is not particularly problematic.  She denies chest pain nausea vomiting or shortness of breath.  Assessment & Plan:  Afib w/ RVR - newly diagnosed CHA2DS2-VASc score is at least 4 - rate currently controlled - TSH normal - cont Eliquis and BB - Cards planning for TEE DCCV 04/05/18 if remains in Afib   Suspected Diastolic CHF  EF preserved on TTE but unable to comment on diastolic dysfunction - trace LE edema appreciable today - holding diuretic at this time due to worsening renal fxn  Filed Weights   04/02/18 0352 04/03/18 0540 04/04/18 0327  Weight: 121.2 kg 121.9 kg 123 kg    Hypomagnesemia  Corrected to goal of 2.0 or greater  Acute renal failure  crt continues to trend up - will check renal US to r/o hydronephrosis - perhaps poor CO in setting of ACEi is to blame - off ACEi - hold on further diuresis for now - avoid IV contrast or NSAIDs  Recent Labs  Lab 04/01/18 1653 04/02/18 0220 04/03/18 0459 04/04/18 0353  CREATININE 1.86* 1.79* 2.47* 2.74*    HTN Well controlled at this time/borderline hypotensive - follow trend - in setting of ARF may have to provide futher volume resuscitation to assure adequate MAP  DM2 CBG controlled  Chronic Normocytic anemia Fe studies suggestive of ACD type picture - follow  - baseline Hgb appears to be 8.5-9.5  Recent Labs  Lab 04/01/18 1653 04/02/18 0428 04/03/18 0459 04/04/18 0353  HGB 9.5* 8.6* 8.7* 8.5*    Morbid obesity - Body mass index is 46.55 kg/m.   UTI UA and clinical sx c/w UTI - began empiric abx - follow culture data   DVT prophylaxis: Eliquis  Code Status: FULL CODE Family Communication: no family present at time of exam  Disposition Plan:   Consultants:  none  Antimicrobials:  none   Objective: Blood pressure 96/69, pulse (!) 102, temperature 98 F (36.7 C), temperature source Oral, resp. rate 19, height 5' 4"  (1.626 m), weight 123 kg, SpO2 98 %.  Intake/Output Summary (Last 24 hours) at 04/04/2018 1455 Last data filed at 04/04/2018 1315 Gross per 24 hour  Intake 850 ml  Output 50 ml  Net 800 ml   Filed Weights   04/02/18 0352 04/03/18 0540 04/04/18 0327  Weight: 121.2 kg 121.9 kg 123 kg    Examination: General: No acute respiratory distress - alert and oriented  Lungs: CTA B - mild exp wheezing  Cardiovascular: Irreg irreg  Abdomen: NT/ND, soft, BS+, no mass  Extremities: 1+ B LE edema w/o signif change   CBC: Recent Labs  Lab 04/02/18 0428 04/03/18 0459 04/04/18 0353  WBC 8.1 8.0 8.0  HGB 8.6* 8.7* 8.5*  HCT 27.3* 29.2* 28.7*  MCV 86.7  89.8 90.0  PLT 302 300 673   Basic Metabolic Panel: Recent Labs  Lab 04/01/18 2245 04/02/18 0220 04/02/18 0952 04/03/18 0459 04/04/18 0353  NA  --  140  --  139 140  K  --  3.9  --  4.8 4.5  CL  --  108  --  104 107  CO2  --  22  --  22 22  GLUCOSE  --  229*  --  186* 86  BUN  --  27*  --  30* 37*  CREATININE  --  1.79*  --  2.47* 2.74*  CALCIUM  --  8.1*  --  8.4* 8.2*  MG 1.2*  --  2.0 2.1  --    GFR: Estimated Creatinine Clearance: 25.8 mL/min (A) (by C-G formula based on SCr of 2.74 mg/dL (H)).  Liver Function Tests: Recent Labs  Lab 04/03/18 0459 04/04/18 0353  AST 13* 11*  ALT 15 12  ALKPHOS 68 63  BILITOT 0.7 0.5  PROT 5.9* 5.5*  ALBUMIN  2.7* 2.6*    Cardiac Enzymes: Recent Labs  Lab 04/01/18 2245 04/02/18 0220 04/02/18 0952  TROPONINI <0.03 <0.03 <0.03    HbA1C: Hgb A1c MFr Bld  Date/Time Value Ref Range Status  02/27/2016 07:20 AM 7.0 (H) 4.8 - 5.6 % Final    Comment:    (NOTE)         Pre-diabetes: 5.7 - 6.4         Diabetes: >6.4         Glycemic control for adults with diabetes: <7.0   11/11/2007 08:05 AM (H)  Final   9.0 (NOTE)   The ADA recommends the following therapeutic goals for glycemic   control related to Hgb A1C measurement:   Goal of Therapy:   < 7.0% Hgb A1C   Action Suggested:  > 8.0% Hgb A1C   Ref:  Diabetes Care, 22, Suppl. 1, 1999    CBG: Recent Labs  Lab 04/03/18 0730 04/03/18 1632 04/03/18 2047 04/04/18 0728 04/04/18 1109  GLUCAP 176* 95 146* 96 132*    Recent Results (from the past 240 hour(s))  MRSA PCR Screening     Status: None   Collection Time: 04/02/18  3:53 AM  Result Value Ref Range Status   MRSA by PCR NEGATIVE NEGATIVE Final    Comment:        The GeneXpert MRSA Assay (FDA approved for NASAL specimens only), is one component of a comprehensive MRSA colonization surveillance program. It is not intended to diagnose MRSA infection nor to guide or monitor treatment for MRSA infections. Performed at [facility], [place] [address], [place], [place] [address]      Scheduled Meds: . apixaban  5 mg Oral BID  . buPROPion  300 mg Oral Daily  . fluticasone  2 spray Each Nare Daily  . insulin aspart  0-9 Units Subcutaneous TID WC  . insulin glargine  18 Units Subcutaneous Daily  . loratadine  10 mg Oral Daily  . metoprolol tartrate  25 mg Oral BID  . pneumococcal 23 valent vaccine  0.5 mL Intramuscular Tomorrow-1000  . tiotropium  18 mcg Inhalation Daily     LOS: 3 days   Cherene Altes, MD [facility] Office  [phone] Pager - Text Page per Amion  If 7PM-7AM, please contact night-coverage per Amion 04/04/2018, 2:55 PM

## 2018-04-04 NOTE — Progress Notes (Signed)
Progress Note  Patient Name: [name] Date of Encounter: 04/04/2018  Primary Cardiologist: No primary care provider on file.   Subjective   Patient is somewhat depressed today, worried about her long term prognosis. She was reading the obituaries and thinking about her own life. She wants to be around as long as she can, as she has an important role as caretaker. Spoke at length about this. Mild nausea today but otherwise doing well.  Inpatient Medications    Scheduled Meds: . apixaban  5 mg Oral BID  . buPROPion  300 mg Oral Daily  . fluticasone  2 spray Each Nare Daily  . insulin aspart  0-9 Units Subcutaneous TID WC  . insulin glargine  18 Units Subcutaneous Daily  . loratadine  10 mg Oral Daily  . metoprolol tartrate  25 mg Oral BID  . pneumococcal 23 valent vaccine  0.5 mL Intramuscular Tomorrow-1000  . tiotropium  18 mcg Inhalation Daily   Continuous Infusions: . cefTRIAXone (ROCEPHIN)  IV    . magnesium sulfate 1 - 4 g bolus IVPB     PRN Meds: acetaminophen **OR** [DISCONTINUED] acetaminophen, guaiFENesin, ondansetron **OR** ondansetron (ZOFRAN) IV   Vital Signs    Vitals:   04/04/18 0326 04/04/18 0327 04/04/18 0731 04/04/18 0808  BP: (!) 109/59  103/60   Pulse: 75  95 (!) 108  Resp:   (!) 21 18  Temp: 97.6 F (36.4 C)  97.9 F (36.6 C)   TempSrc: Oral  Oral   SpO2: 98%  98% 94%  Weight:  123 kg    Height:        Intake/Output Summary (Last 24 hours) at 04/04/2018 0905 Last data filed at 04/04/2018 0332 Gross per 24 hour  Intake 730 ml  Output 50 ml  Net 680 ml   Filed Weights   04/02/18 0352 04/03/18 0540 04/04/18 0327  Weight: 121.2 kg 121.9 kg 123 kg    Telemetry    Atrial fibrillation, HR in the 90s. - Personally Reviewed  ECG    From 8/15 afib, RVR - Personally Reviewed  Physical Exam   GEN: No acute distress.  Sitting comfortably in bed eating breakfast Neck: supple, difficult to assess JVD Cardiac: irregular S1 and S2, no  murmurs, rubs, or gallops.  Respiratory: Clear to auscultation bilaterally. GI: Soft, nontender, non-distended. Bowel sounds normal.  MS: 1+ bilateral LE edema; No deformity. Neuro:  Nonfocal, moves all limbs independently Psych: Normal affect   Labs    Chemistry Recent Labs  Lab 04/02/18 0220 04/03/18 0459 04/04/18 0353  NA 140 139 140  K 3.9 4.8 4.5  CL 108 104 107  CO2 22 22 22   GLUCOSE 229* 186* 86  BUN 27* 30* 37*  CREATININE 1.79* 2.47* 2.74*  CALCIUM 8.1* 8.4* 8.2*  PROT  --  5.9* 5.5*  ALBUMIN  --  2.7* 2.6*  AST  --  13* 11*  ALT  --  15 12  ALKPHOS  --  68 63  BILITOT  --  0.7 0.5  GFRNONAA 28* 19* 17*  GFRAA 33* 22* 20*  ANIONGAP 10 13 11      Hematology Recent Labs  Lab 04/02/18 0428 04/03/18 0459 04/04/18 0353  WBC 8.1 8.0 8.0  RBC 3.15* 3.25* 3.19*  HGB 8.6* 8.7* 8.5*  HCT 27.3* 29.2* 28.7*  MCV 86.7 89.8 90.0  MCH 27.3 26.8 26.6  MCHC 31.5 29.8* 29.6*  RDW 15.2 15.3 15.3  PLT 302 300 291  Cardiac Enzymes Recent Labs  Lab 04/01/18 2245 04/02/18 0220 04/02/18 0952  TROPONINI <0.03 <0.03 <0.03    Recent Labs  Lab 04/01/18 1708  TROPIPOC 0.02     BNPNo results for input(s): BNP, PROBNP in the last 168 hours.   DDimer No results for input(s): DDIMER in the last 168 hours.   Radiology    No results found.  Cardiac Studies   Echo 8.16.19 Left ventricle: The cavity size was normal. Wall thickness was   increased in a pattern of mild LVH. Systolic function was normal.   The estimated ejection fraction was in the range of 55% to 60%.   Although no diagnostic regional wall motion abnormality was   identified, this possibility cannot be completely excluded on the   basis of this study. The study was not technically sufficient to   allow evaluation of LV diastolic dysfunction due to atrial   fibrillation. - Aortic valve: There was no stenosis. - Mitral valve: Moderately to severely calcified annulus. Mildly   calcified leaflets  . Elevated mean gradient across mitral but   valve area by pressure half-time was normal. Probably no   significant mitral stenosis. There was trivial regurgitation.   Mean gradient (D): 5 mm Hg. Valve area by pressure half-time:   2.58 cm^2. - Left atrium: The atrium was mildly dilated. - Right ventricle: The cavity size was normal. Systolic function   was mildly reduced. - Right atrium: The atrium was mildly dilated. - Tricuspid valve: There was moderate regurgitation. Peak RV-RA   gradient (S): 31 mm Hg. - Pulmonary arteries: PA peak pressure: 46 mm Hg (S). - Systemic veins: IVC measured 2.5 cm with < 50% respirophasic   variation, suggesting RA pressure 15 mmHg.  Impressions:  - The patient was in atrial fibrillation. Normal LV size with mild   LV hypertrophy. EF 55-60%. Normal RV size with mildly decreased   systolic function. Moderate pulmonary hypertension. Calcified   mitral valve and annulus, elevated mean gradient but normal   pressure half-time, probably no significant stenosis. Biatrial   enlargement. Dilated IVC suggestive of elevated RV filling   pressure.  Patient Profile     67 y.o. female with three week history of DOE, orthopnea, weight gain, increasing abdominal girth but no palpitations or chest pain who presented to ER with progressive symptoms and was found to be in afib RVR.   Assessment & Plan    Atrial fibrillation: now off diltiazem drip, on metoprolol. Rate controlled in the 90s.    -continue apixaban, metoprolol 25 mg BID. -if she remains in afib, will plan for TEE guided cardioversion tomorrow. Patient amenable  After careful review of history and examination, the risks and benefits of transesophageal echocardiogram have been explained including risks of esophageal damage, perforation (1:10,000 risk), bleeding, pharyngeal hematoma as well as other potential complications associated with conscious sedation including aspiration, arrhythmia, respiratory  failure and death. The various methods of treatment have been discussed with the patient, as well as alternatives. After consideration of risks, benefits and other options for treatment, the patient has consented to  Procedure(s): TRANSESOPHAGEAL ECHOCARDIOGRAM (TEE) (N/A) as a surgical intervention .  The patient's history has been reviewed, patient examined, no change in status, stable for surgery.  I have reviewed the patient's chart and labs.  Questions were answered to the patient's satisfaction.    -CHADSVASC:  CHA2DS2/VAS Stroke Risk Points  Current as of 57 minutes ago     5 >= 2 Points: High  Risk  1 - 1.99 Points: Medium Risk  0 Points: Low Risk     Points Metrics  1 Has Congestive Heart Failure:  Yes   0 Has Vascular Disease:  No   1 Has Hypertension:  Yes   1 Age:  41   1 Has Diabetes:  Yes   0 Had Stroke:  No  Had TIA:  No  Had thromboembolism:  No   1 Female:  Yes    Acute diastolic heart failure: echo with normal EF. Likely tachycardia mediated. Ins and outs as well as daily weights suggest that she is near even. Does report improved symptoms and swelling, however. Creatinine has also risen. Difficult to assess JVD but lungs are clear.  -hold diuresis given bump in creatinine  Hypertension: holding ACEI in the setting of AKI on CKD  Type II diabetes: Being managed by primary team.   Lipids: tchol 98, HDL 50, LDL 30. Not on statin, very low baseline LDL.   Normocytic anemia: stable. No clear bleeding source. May be anemia of chronic disease. Will defer workup to primary team, but if there is evidence of bleeding will need to assess given her CHADSVASC risk and need for anticoagulation.     Time Spent Directly with Patient: I have spent a total of 25 minutes with the patient reviewing hospital notes, telemetry, EKGs, labs and examining the patient as well as establishing an assessment and plan that was discussed personally with the patient.  > 50% of time was spent in direct  patient care.  Length of Stay:  LOS: 3 days   Buford Dresser, MD, PhD [facility]  [facility]   04/04/2018, 9:05 AM      For questions or updates, please contact [place] Please consult [email or web address] for contact info under Cardiology/STEMI.

## 2018-04-04 NOTE — H&P (View-Only) (Signed)
[place] TEAM 1 - Stepdown/ICU TEAM  LAURAL EILAND  RJJ:[id number] DOB: 1950/12/07 DOA: 04/01/2018 PCP: [facility]    Brief Narrative:  67 y.o. female with a hx of HTN and DM2 who c/o  SOB w/ exertion and lying down over the 3 weeks.  Denied palpitations or chest pain. Noticed increasing lower extremity edema not responsive to lasix newly started by her PCP.   In the ER she was found to be in A. fib with RVR.  On exam patient had elevated JVD and bilateral lower extremity edema.    Significant Events: 8/15 admit  Subjective: The patient is resting comfortably in bed.  She reports some mild wheezing that she is noted today but states it is not particularly problematic.  She denies chest pain nausea vomiting or shortness of breath.  Assessment & Plan:  Afib w/ RVR - newly diagnosed CHA2DS2-VASc score is at least 4 - rate currently controlled - TSH normal - cont Eliquis and BB - Cards planning for TEE DCCV 04/05/18 if remains in Afib   Suspected Diastolic CHF  EF preserved on TTE but unable to comment on diastolic dysfunction - trace LE edema appreciable today - holding diuretic at this time due to worsening renal fxn  Filed Weights   04/02/18 0352 04/03/18 0540 04/04/18 0327  Weight: 121.2 kg 121.9 kg 123 kg    Hypomagnesemia  Corrected to goal of 2.0 or greater  Acute renal failure  crt continues to trend up - will check renal US to r/o hydronephrosis - perhaps poor CO in setting of ACEi is to blame - off ACEi - hold on further diuresis for now - avoid IV contrast or NSAIDs  Recent Labs  Lab 04/01/18 1653 04/02/18 0220 04/03/18 0459 04/04/18 0353  CREATININE 1.86* 1.79* 2.47* 2.74*    HTN Well controlled at this time/borderline hypotensive - follow trend - in setting of ARF may have to provide futher volume resuscitation to assure adequate MAP  DM2 CBG controlled  Chronic Normocytic anemia Fe studies suggestive of ACD type picture - follow  - baseline Hgb appears to be 8.5-9.5  Recent Labs  Lab 04/01/18 1653 04/02/18 0428 04/03/18 0459 04/04/18 0353  HGB 9.5* 8.6* 8.7* 8.5*    Morbid obesity - Body mass index is 46.55 kg/m.   UTI UA and clinical sx c/w UTI - began empiric abx - follow culture data   DVT prophylaxis: Eliquis  Code Status: FULL CODE Family Communication: no family present at time of exam  Disposition Plan:   Consultants:  none  Antimicrobials:  none   Objective: Blood pressure 96/69, pulse (!) 102, temperature 98 F (36.7 C), temperature source Oral, resp. rate 19, height 5' 4"  (0.160 m), weight 123 kg, SpO2 98 %.  Intake/Output Summary (Last 24 hours) at 04/04/2018 1455 Last data filed at 04/04/2018 1315 Gross per 24 hour  Intake 850 ml  Output 50 ml  Net 800 ml   Filed Weights   04/02/18 0352 04/03/18 0540 04/04/18 0327  Weight: 121.2 kg 121.9 kg 123 kg    Examination: General: No acute respiratory distress - alert and oriented  Lungs: CTA B - mild exp wheezing  Cardiovascular: Irreg irreg  Abdomen: NT/ND, soft, BS+, no mass  Extremities: 1+ B LE edema w/o signif change   CBC: Recent Labs  Lab 04/02/18 0428 04/03/18 0459 04/04/18 0353  WBC 8.1 8.0 8.0  HGB 8.6* 8.7* 8.5*  HCT 27.3* 29.2* 28.7*  MCV 86.7  89.8 90.0  PLT 302 300 809   Basic Metabolic Panel: Recent Labs  Lab 04/01/18 2245 04/02/18 0220 04/02/18 0952 04/03/18 0459 04/04/18 0353  NA  --  140  --  139 140  K  --  3.9  --  4.8 4.5  CL  --  108  --  104 107  CO2  --  22  --  22 22  GLUCOSE  --  229*  --  186* 86  BUN  --  27*  --  30* 37*  CREATININE  --  1.79*  --  2.47* 2.74*  CALCIUM  --  8.1*  --  8.4* 8.2*  MG 1.2*  --  2.0 2.1  --    GFR: Estimated Creatinine Clearance: 25.8 mL/min (A) (by C-G formula based on SCr of 2.74 mg/dL (H)).  Liver Function Tests: Recent Labs  Lab 04/03/18 0459 04/04/18 0353  AST 13* 11*  ALT 15 12  ALKPHOS 68 63  BILITOT 0.7 0.5  PROT 5.9* 5.5*  ALBUMIN  2.7* 2.6*    Cardiac Enzymes: Recent Labs  Lab 04/01/18 2245 04/02/18 0220 04/02/18 0952  TROPONINI <0.03 <0.03 <0.03    HbA1C: Hgb A1c MFr Bld  Date/Time Value Ref Range Status  02/27/2016 07:20 AM 7.0 (H) 4.8 - 5.6 % Final    Comment:    (NOTE)         Pre-diabetes: 5.7 - 6.4         Diabetes: >6.4         Glycemic control for adults with diabetes: <7.0   11/11/2007 08:05 AM (H)  Final   9.0 (NOTE)   The ADA recommends the following therapeutic goals for glycemic   control related to Hgb A1C measurement:   Goal of Therapy:   < 7.0% Hgb A1C   Action Suggested:  > 8.0% Hgb A1C   Ref:  Diabetes Care, 22, Suppl. 1, 1999    CBG: Recent Labs  Lab 04/03/18 0730 04/03/18 1632 04/03/18 2047 04/04/18 0728 04/04/18 1109  GLUCAP 176* 95 146* 96 132*    Recent Results (from the past 240 hour(s))  MRSA PCR Screening     Status: None   Collection Time: 04/02/18  3:53 AM  Result Value Ref Range Status   MRSA by PCR NEGATIVE NEGATIVE Final    Comment:        The GeneXpert MRSA Assay (FDA approved for NASAL specimens only), is one component of a comprehensive MRSA colonization surveillance program. It is not intended to diagnose MRSA infection nor to guide or monitor treatment for MRSA infections. Performed at [facility], [place] [address] [place], [place] [address]      Scheduled Meds: . apixaban  5 mg Oral BID  . buPROPion  300 mg Oral Daily  . fluticasone  2 spray Each Nare Daily  . insulin aspart  0-9 Units Subcutaneous TID WC  . insulin glargine  18 Units Subcutaneous Daily  . loratadine  10 mg Oral Daily  . metoprolol tartrate  25 mg Oral BID  . pneumococcal 23 valent vaccine  0.5 mL Intramuscular Tomorrow-1000  . tiotropium  18 mcg Inhalation Daily     LOS: 3 days   Cherene Altes, MD [facility] Office  [phone] Pager - Text Page per Amion  If 7PM-7AM, please contact night-coverage per Amion 04/04/2018, 2:55 PM

## 2018-04-05 ENCOUNTER — Encounter (HOSPITAL_COMMUNITY)
Admission: EM | Disposition: A | Discharge status: Home Health | Payer: Self-pay | Source: Home / Self Care | Attending: Internal Medicine

## 2018-04-05 ENCOUNTER — Inpatient Hospital Stay (HOSPITAL_COMMUNITY): Payer: BLUE CROSS/BLUE SHIELD

## 2018-04-05 ENCOUNTER — Inpatient Hospital Stay (HOSPITAL_COMMUNITY): Payer: BLUE CROSS/BLUE SHIELD | Admitting: Anesthesiology

## 2018-04-05 ENCOUNTER — Encounter (HOSPITAL_COMMUNITY): Payer: Self-pay | Admitting: *Deleted

## 2018-04-05 DIAGNOSIS — I513 Intracardiac thrombosis, not elsewhere classified: Secondary | ICD-10-CM

## 2018-04-05 DIAGNOSIS — I4891 Unspecified atrial fibrillation: Secondary | ICD-10-CM

## 2018-04-05 DIAGNOSIS — R0602 Shortness of breath: Secondary | ICD-10-CM

## 2018-04-05 DIAGNOSIS — I340 Nonrheumatic mitral (valve) insufficiency: Secondary | ICD-10-CM

## 2018-04-05 HISTORY — PX: TEE WITHOUT CARDIOVERSION: SHX5443

## 2018-04-05 LAB — RENAL FUNCTION PANEL
Albumin: 2.5 g/dL — ABNORMAL LOW (ref 3.5–5.0)
Anion gap: 7 (ref 5–15)
BUN: 37 mg/dL — ABNORMAL HIGH (ref 8–23)
CALCIUM: 8.4 mg/dL — AB (ref 8.9–10.3)
CHLORIDE: 109 mmol/L (ref 98–111)
CO2: 25 mmol/L (ref 22–32)
CREATININE: 2.05 mg/dL — AB (ref 0.44–1.00)
GFR, EST AFRICAN AMERICAN: 28 mL/min — AB (ref >60)
GFR, EST NON AFRICAN AMERICAN: 24 mL/min — AB (ref >60)
Glucose, Bld: 101 mg/dL — ABNORMAL HIGH (ref 70–99)
Phosphorus: 4.9 mg/dL — ABNORMAL HIGH (ref 2.5–4.6)
Potassium: 4.8 mmol/L (ref 3.5–5.1)
SODIUM: 141 mmol/L (ref 135–145)

## 2018-04-05 LAB — GLUCOSE, CAPILLARY
GLUCOSE-CAPILLARY: 103 mg/dL — AB (ref 70–99)
GLUCOSE-CAPILLARY: 127 mg/dL — AB (ref 70–99)
Glucose-Capillary: 156 mg/dL — ABNORMAL HIGH (ref 70–99)
Glucose-Capillary: 76 mg/dL (ref 70–99)
Glucose-Capillary: 86 mg/dL (ref 70–99)
Glucose-Capillary: 257 mg/dL — ABNORMAL HIGH (ref 70–99)

## 2018-04-05 LAB — CBC
HCT: 29.1 % — ABNORMAL LOW (ref 36.0–46.0)
Hemoglobin: 8.4 g/dL — ABNORMAL LOW (ref 12.0–15.0)
MCH: 26.1 pg (ref 26.0–34.0)
MCHC: 28.9 g/dL — ABNORMAL LOW (ref 30.0–36.0)
MCV: 90.4 fL (ref 78.0–100.0)
Platelets: 292 10*3/uL (ref 150–400)
RBC: 3.22 MIL/uL — ABNORMAL LOW (ref 3.87–5.11)
RDW: 15.5 % (ref 11.5–15.5)
WBC: 9.1 10*3/uL (ref 4.0–10.5)

## 2018-04-05 SURGERY — ECHOCARDIOGRAM, TRANSESOPHAGEAL
Anesthesia: General

## 2018-04-05 MED ORDER — LIDOCAINE HCL (CARDIAC) PF 100 MG/5ML IV SOSY
PREFILLED_SYRINGE | INTRAVENOUS | Status: DC | PRN
  Administered 2018-04-05: 50 mg via INTRATRACHEAL

## 2018-04-05 MED ORDER — SODIUM CHLORIDE 0.9 % IV SOLN
250.0000 mL | INTRAVENOUS | Status: DC
  Administered 2018-04-05: 12:00:00 via INTRAVENOUS

## 2018-04-05 MED ORDER — PROPOFOL 10 MG/ML IV BOLUS
INTRAVENOUS | Status: DC | PRN
  Administered 2018-04-05: 100 ug/kg/min via INTRAVENOUS

## 2018-04-05 MED ORDER — PROPOFOL 10 MG/ML IV BOLUS
INTRAVENOUS | Status: DC | PRN
  Administered 2018-04-05: 20 mg via INTRAVENOUS

## 2018-04-05 MED ORDER — APIXABAN 5 MG PO TABS: 5.0000 mg | ORAL_TABLET | Freq: Two times a day (BID) | ORAL | Status: DC

## 2018-04-05 MED ORDER — SODIUM CHLORIDE 0.9% FLUSH: 3.0000 mL | INTRAVENOUS | Status: DC | PRN

## 2018-04-05 MED ORDER — SODIUM CHLORIDE 0.9% FLUSH
3.0000 mL | Freq: Two times a day (BID) | INTRAVENOUS | Status: DC
  Administered 2018-04-05 – 2018-04-07 (×4): 3 mL via INTRAVENOUS

## 2018-04-05 MED ORDER — PERFLUTREN LIPID MICROSPHERE
INTRAVENOUS | Status: AC
  Filled 2018-04-05: qty 10

## 2018-04-05 MED ORDER — PERFLUTREN LIPID MICROSPHERE
INTRAVENOUS | Status: DC | PRN
  Administered 2018-04-05: 2 mL via INTRAVENOUS

## 2018-04-05 MED ORDER — PHENYLEPHRINE HCL 10 MG/ML IJ SOLN
INTRAMUSCULAR | Status: DC | PRN
  Administered 2018-04-05 (×2): 80 ug via INTRAVENOUS
  Administered 2018-04-05: 120 ug via INTRAVENOUS
  Administered 2018-04-05: 80 ug via INTRAVENOUS
  Administered 2018-04-05: 120 ug via INTRAVENOUS

## 2018-04-05 MED ORDER — APIXABAN 5 MG PO TABS
10.0000 mg | ORAL_TABLET | Freq: Two times a day (BID) | ORAL | Status: DC
  Administered 2018-04-05 – 2018-04-08 (×6): 10 mg via ORAL
  Filled 2018-04-05 (×8): qty 2

## 2018-04-05 NOTE — Progress Notes (Signed)
  Echocardiogram Echocardiogram Transesophageal has been performed.  [name] 04/05/2018, 1:13 PM

## 2018-04-05 NOTE — Progress Notes (Signed)
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 They dont have other family help/support due to deaths.  Much concern about how to help this child especially as she is sick. Listened to her concerns and addressed them in prayer, sought to offer encouragement. Conard Novak, Chaplain   04/05/18 1000  Clinical Encounter Type  Visited With Patient  Visit Type Initial;Spiritual support;Other (Comment) (Patient upset and concerned about family-child in her care)  Referral From Patient  Consult/Referral To Chaplain  Spiritual Encounters  Spiritual Needs Prayer;Emotional  Stress Factors  Patient Stress Factors Family relationships;Health changes;Other (Comment) (very concerned about grandchild she is raising )  Family Stress Factors Lack of caregivers;Other (Comment) (concerned about child in her care due to her own sickness)

## 2018-04-05 NOTE — Transfer of Care (Signed)
Immediate Anesthesia Transfer of Care Note  Patient: [name]  Procedure(s) Performed: TRANSESOPHAGEAL ECHOCARDIOGRAM (TEE) (N/A )  Patient Location: Endoscopy Unit  Anesthesia Type:MAC  Level of Consciousness: awake, alert , oriented and patient cooperative  Airway & Oxygen Therapy: Patient Spontanous Breathing and Patient connected to nasal cannula oxygen  Post-op Assessment: Report given to RN and Post -op Vital signs reviewed and stable  Post vital signs: Reviewed and stable  Last Vitals:  Vitals Value Taken Time  BP 118/102 04/05/2018 12:47 PM  Temp 36.5 C 04/05/2018 12:46 PM  Pulse 101 04/05/2018 12:49 PM  Resp 12 04/05/2018 12:49 PM  SpO2 97 % 04/05/2018 12:49 PM  Vitals shown include unvalidated device data.  Last Pain:  Vitals:   04/05/18 1246  TempSrc: Oral  PainSc: 0-No pain      Patients Stated Pain Goal: 0 (64/31/42 7670)  Complications: No apparent anesthesia complications

## 2018-04-05 NOTE — Progress Notes (Signed)
Progress Note  Patient Name: [name] Date of Encounter: 04/05/2018  Primary Cardiologist: No primary care provider on file.   Subjective   Feeling tired this morning. States her breathing is unchanged from yesterday. Denies new complaints. Denies chest pain.   Inpatient Medications    Scheduled Meds: . apixaban  5 mg Oral BID  . buPROPion  300 mg Oral Daily  . fluticasone  2 spray Each Nare Daily  . insulin aspart  0-9 Units Subcutaneous TID WC  . insulin glargine  18 Units Subcutaneous Daily  . loratadine  10 mg Oral Daily  . metoprolol tartrate  25 mg Oral BID  . tiotropium  18 mcg Inhalation Daily   Continuous Infusions: . sodium chloride    . cefTRIAXone (ROCEPHIN)  IV 1 g (04/04/18 0945)   PRN Meds: acetaminophen **OR** [DISCONTINUED] acetaminophen, guaiFENesin, ondansetron **OR** ondansetron (ZOFRAN) IV   Vital Signs    Vitals:   04/04/18 2235 04/05/18 0039 04/05/18 0635 04/05/18 0743  BP: 112/64 112/61 124/66 105/66  Pulse: 100 (!) 104 97 (!) 106  Resp:  17 16 18   Temp:  97.8 F (36.6 C) (!) 97.5 F (36.4 C) 98.4 F (36.9 C)  TempSrc:  Oral Oral Oral  SpO2:  98% 97% 98%  Weight:   124.5 kg   Height:        Intake/Output Summary (Last 24 hours) at 04/05/2018 0834 Last data filed at 04/05/2018 2376 Gross per 24 hour  Intake 700 ml  Output 850 ml  Net -150 ml   Filed Weights   04/03/18 0540 04/04/18 0327 04/05/18 0635  Weight: 121.9 kg 123 kg 124.5 kg    Telemetry    Atrial fibrillation with rate 90s-110s - Personally Reviewed  Physical Exam   GEN: Obese female, laying in bed in no acute distress.   Neck: difficult to assess JVD given body habitus, no carotid bruits Cardiac: IRIR, no murmurs, rubs, or gallops.  Respiratory:  no obvious wheezes/ rales/ rhonchi but difficult exam given limited mobility in bed.  GI: NABS, Soft, obese, nontender, non-distended  MS: No edema; No deformity. Neuro:  Nonfocal, moving all extremities  spontaneously Psych: Normal affect   Labs    Chemistry Recent Labs  Lab 04/02/18 0220 04/03/18 0459 04/04/18 0353  NA 140 139 140  K 3.9 4.8 4.5  CL 108 104 107  CO2 22 22 22   GLUCOSE 229* 186* 86  BUN 27* 30* 37*  CREATININE 1.79* 2.47* 2.74*  CALCIUM 8.1* 8.4* 8.2*  PROT  --  5.9* 5.5*  ALBUMIN  --  2.7* 2.6*  AST  --  13* 11*  ALT  --  15 12  ALKPHOS  --  68 63  BILITOT  --  0.7 0.5  GFRNONAA 28* 19* 17*  GFRAA 33* 22* 20*  ANIONGAP 10 13 11      Hematology Recent Labs  Lab 04/03/18 0459 04/04/18 0353 04/05/18 0713  WBC 8.0 8.0 9.1  RBC 3.25* 3.19* 3.22*  HGB 8.7* 8.5* 8.4*  HCT 29.2* 28.7* 29.1*  MCV 89.8 90.0 90.4  MCH 26.8 26.6 26.1  MCHC 29.8* 29.6* 28.9*  RDW 15.3 15.3 15.5  PLT 300 291 292    Cardiac Enzymes Recent Labs  Lab 04/01/18 2245 04/02/18 0220 04/02/18 0952  TROPONINI <0.03 <0.03 <0.03    Recent Labs  Lab 04/01/18 1708  TROPIPOC 0.02     BNPNo results for input(s): BNP, PROBNP in the last 168 hours.   DDimer  No results for input(s): DDIMER in the last 168 hours.   Radiology    US Renal  Result Date: 04/05/2018 CLINICAL DATA:  Acute renal injury EXAM: RENAL / URINARY TRACT ULTRASOUND COMPLETE COMPARISON:  None. FINDINGS: Right Kidney: Length: 8.9 cm. Echogenicity within normal limits. No mass or hydronephrosis visualized. Left Kidney: Length: 10.3 cm. Echogenicity within normal limits. No mass or hydronephrosis visualized. Bladder: Appears normal for degree of bladder distention. IMPRESSION: Difficult visualization of the kidneys due to habitus. No hydronephrosis. Electronically Signed   By: Donavan Foil M.D.   On: 04/05/2018 00:50    Cardiac Studies   Echo 8.16.19 Left ventricle: The cavity size was normal. Wall thickness was increased in a pattern of mild LVH. Systolic function was normal.The estimated ejection fraction was in the range of 55% to 60%.Although no diagnostic regional wall motion abnormality  wasidentified, this possibility cannot be completely excluded on thebasis of this study. The study was not technically sufficient toallow evaluation of LV diastolic dysfunction due to atrialfibrillation. - Aortic valve: There was no stenosis. - Mitral valve: Moderately to severely calcified annulus. Mildly calcified leaflets . Elevated mean gradient across mitral but valve area by pressure half-time was normal. Probably no significant mitral stenosis. There was trivial regurgitation. Mean gradient (D): 5 mm Hg. Valve area by pressure half-time:2.58 cm^2. - Left atrium: The atrium was mildly dilated. - Right ventricle: The cavity size was normal. Systolic function was mildly reduced. - Right atrium: The atrium was mildly dilated. - Tricuspid valve: There was moderate regurgitation. Peak RV-RAgradient (S): 31 mm Hg. - Pulmonary arteries: PA peak pressure: 46 mm Hg (S). - Systemic veins: IVC measured 2.5 cm with < 50% respirophasicvariation, suggesting RA pressure 15 mmHg.  Impressions:  - The patient was in atrial fibrillation. Normal LV size with mild LV hypertrophy. EF 55-60%. Normal RV size with mildly decreasedsystolic function. Moderate pulmonary hypertension. Calcifiedmitral valve and annulus, elevated mean gradient but normalpressure half-time, probably no significant stenosis. Biatrialenlargement. Dilated IVC suggestive of elevated RV fillingpressure.  Patient Profile     67 y.o. female with three week history of DOE, orthopnea, weight gain, increasing abdominal girth but no palpitations or chest pain who presented to ER with progressive symptoms and was found to be in afib RVR.   Assessment & Plan    1. New onset atrial fibrillation: initially managed with diltiazem gtt, now on po metoprolol with rates in the 90s-110s this morning. BP is a little soft limiting titration of metoprolol. Planning for TEE/DCCV today at 1:40pm with Dr. Meda Coffee - Continue  metoprolol 107m BID for now - Continue apixaban 583mBID for stroke prevention  2. Acute diastolic CHF: echo with EF 55-60%. Suspect CHF is tachycardia medicated. She was diuresed with IV lasix but her Cr bumped and lasix was discontinued 8/17. She remained net -15032mesterday.  - Continue to hold diuretics - Continue to monitor strict I&Os and daily weights  3. Acute on chronic CKD stage 3: Cr was 1.86 on admission (1 year ago was 0.97) and increased to 2.74 after diuresis. IV lasix was held. Await Cr this morning - Continue to monitor Cr closely  4. HTN: BP stable on metoprolol 37m8mD. Home ACEi on hold given AKI.  - Continue to monitor closely   5. DM type 2: - Continue management per primary team  6. Normocytic anemia: Hgb stable at 8.4 today; no clear evidence of bleeding at this time.  - Continue to monitor Hgb closely with DOAC use.  For questions or updates, please contact [place] Please consult [email or web address] for contact info under Cardiology/STEMI.      Signed, Abigail Butts, PA-C  04/05/2018, 8:34 AM   [phone]

## 2018-04-05 NOTE — Anesthesia Postprocedure Evaluation (Signed)
Anesthesia Post Note  Patient: [name]  Procedure(s) Performed: TRANSESOPHAGEAL ECHOCARDIOGRAM (TEE) (N/A )     Anesthesia Type: General Level of consciousness: awake Pain management: pain level controlled Vital Signs Assessment: post-procedure vital signs reviewed and stable Respiratory status: spontaneous breathing Cardiovascular status: stable Postop Assessment: adequate PO intake Anesthetic complications: no    Last Vitals:  Vitals:   04/05/18 1319 04/05/18 1534  BP: 125/79 112/68  Pulse: (!) 106 (!) 112  Resp: 20 18  Temp: 37.1 C 36.6 C  SpO2:  93%    Last Pain:  Vitals:   04/05/18 1534  TempSrc: Axillary  PainSc:                  [name]

## 2018-04-05 NOTE — Anesthesia Preprocedure Evaluation (Signed)
Anesthesia Evaluation  Patient identified by MRN, date of birth, ID band Patient awake    Reviewed: Allergy & Precautions, NPO status , Patient's Chart, lab work & pertinent test results  Airway Mallampati: II  TM Distance: >3 FB     Dental   Pulmonary neg pulmonary ROS,    breath sounds clear to auscultation       Cardiovascular hypertension, +CHF   Rhythm:Irregular Rate:Normal     Neuro/Psych    GI/Hepatic negative GI ROS, Neg liver ROS,   Endo/Other  diabetes  Renal/GU Renal disease     Musculoskeletal   Abdominal   Peds  Hematology   Anesthesia Other Findings   Reproductive/Obstetrics                             Anesthesia Physical Anesthesia Plan  ASA: III  Anesthesia Plan: General   Post-op Pain Management:    Induction: Intravenous  PONV Risk Score and Plan: Treatment may vary due to age or medical condition  Airway Management Planned: Simple Face Mask and Nasal Cannula  Additional Equipment:   Intra-op Plan:   Post-operative Plan:   Informed Consent: I have reviewed the patients History and Physical, chart, labs and discussed the procedure including the risks, benefits and alternatives for the proposed anesthesia with the patient or authorized representative who has indicated his/her understanding and acceptance.   Dental advisory given  Plan Discussed with: CRNA and Anesthesiologist  Anesthesia Plan Comments:         Anesthesia Quick Evaluation

## 2018-04-05 NOTE — Discharge Instructions (Addendum)
Heart Failure Heart failure means your heart has trouble pumping blood. This makes it hard for your body to work well. Heart failure is usually a long-term (chronic) condition. You must take good care of yourself and follow your doctor's treatment plan. Follow these instructions at home:  Take your heart medicine as told by your doctor. ? Do not stop taking medicine unless your doctor tells you to. ? Do not skip any dose of medicine. ? Refill your medicines before they run out. ? Take other medicines only as told by your doctor or pharmacist.  Stay active if told by your doctor. The elderly and people with severe heart failure should talk with a doctor about physical activity.  Eat heart-healthy foods. Choose foods that are without trans fat and are low in saturated fat, cholesterol, and salt (sodium). This includes fresh or frozen fruits and vegetables, fish, lean meats, fat-free or low-fat dairy foods, whole grains, and high-fiber foods. Lentils and dried peas and beans (legumes) are also good choices.  Limit salt if told by your doctor.  Cook in a healthy way. Roast, grill, broil, bake, poach, steam, or stir-fry foods.  Limit fluids as told by your doctor.  Weigh yourself every morning. Do this after you pee (urinate) and before you eat breakfast. Write down your weight to give to your doctor.  Take your blood pressure and write it down if your doctor tells you to.  Ask your doctor how to check your pulse. Check your pulse as told.  Lose weight if told by your doctor.  Stop smoking or chewing tobacco. Do not use gum or patches that help you quit without your doctor's approval.  Schedule and go to doctor visits as told.  Nonpregnant women should have no more than 1 drink a day. Men should have no more than 2 drinks a day. Talk to your doctor about drinking alcohol.  Stop illegal drug use.  Stay current with shots (immunizations).  Manage your health conditions as told by your  doctor.  Learn to manage your stress.  Rest when you are tired.  If it is really hot outside: ? Avoid intense activities. ? Use air conditioning or fans, or get in a cooler place. ? Avoid caffeine and alcohol. ? Wear loose-fitting, lightweight, and light-colored clothing.  If it is really cold outside: ? Avoid intense activities. ? Layer your clothing. ? Wear mittens or gloves, a hat, and a scarf when going outside. ? Avoid alcohol.  Learn about heart failure and get support as needed.  Get help to maintain or improve your quality of life and your ability to care for yourself as needed. Contact a doctor if:  You gain weight quickly.  You are more short of breath than usual.  You cannot do your normal activities.  You tire easily.  You cough more than normal, especially with activity.  You have any or more puffiness (swelling) in areas such as your hands, feet, ankles, or belly (abdomen).  You cannot sleep because it is hard to breathe.  You feel like your heart is beating fast (palpitations).  You get dizzy or light-headed when you stand up. Get help right away if:  You have trouble breathing.  There is a change in mental status, such as becoming less alert or not being able to focus.  You have chest pain or discomfort.  You faint. This information is not intended to replace advice given to you by your health care provider. Make sure you  discuss any questions you have with your health care provider. Document Released: 05/13/2008 Document Revised: 01/10/2016 Document Reviewed: 09/20/2012 Elsevier Interactive Patient Education  2017 [place] on my medicine - ELIQUIS (apixaban)  This medication education was reviewed with me or my healthcare representative as part of my discharge preparation.  The pharmacist that spoke with me during my hospital stay was:  Lavenia Atlas, [facility]  Why was Eliquis prescribed for you? Eliquis was  prescribed to treat blood clots that may have been found in the veins of your legs (deep vein thrombosis) or in your lungs (pulmonary embolism) and to reduce the risk of them occurring again.  What do You need to know about Eliquis ? The starting dose is 10 mg (two 5 mg tablets) taken TWICE daily for the FIRST SEVEN (7) DAYS, then on (enter date)  04/13/18  the dose is reduced to ONE 5 mg tablet taken TWICE daily.  Eliquis may be taken with or without food.   Try to take the dose about the same time in the morning and in the evening. If you have difficulty swallowing the tablet whole please discuss with your pharmacist how to take the medication safely.  Take Eliquis exactly as prescribed and DO NOT stop taking Eliquis without talking to the doctor who prescribed the medication.  Stopping may increase your risk of developing a new blood clot.  Refill your prescription before you run out.  After discharge, you should have regular check-up appointments with your healthcare provider that is prescribing your Eliquis.    What do you do if you miss a dose? If a dose of ELIQUIS is not taken at the scheduled time, take it as soon as possible on the same day and twice-daily administration should be resumed. The dose should not be doubled to make up for a missed dose.  Important Safety Information A possible side effect of Eliquis is bleeding. You should call your healthcare provider right away if you experience any of the following: ? Bleeding from an injury or your nose that does not stop. ? Unusual colored urine (red or dark brown) or unusual colored stools (red or black). ? Unusual bruising for unknown reasons. ? A serious fall or if you hit your head (even if there is no bleeding).  Some medicines may interact with Eliquis and might increase your risk of bleeding or clotting while on Eliquis. To help avoid this, consult your healthcare provider or pharmacist prior to using any new  prescription or non-prescription medications, including herbals, vitamins, non-steroidal anti-inflammatory drugs (NSAIDs) and supplements.  This website has more information on Eliquis (apixaban): http://www.eliquis.com/eliquis/home   Heart-Healthy Eating Plan Heart-healthy meal planning includes:  Limiting unhealthy fats.  Increasing healthy fats.  Making other small dietary changes.  You may need to talk with your doctor or a diet specialist (dietitian) to create an eating plan that is right for you. What types of fat should I choose?  Choose healthy fats. These include olive oil and canola oil, flaxseeds, walnuts, almonds, and seeds.  Eat more omega-3 fats. These include salmon, mackerel, sardines, tuna, flaxseed oil, and ground flaxseeds. Try to eat fish at least twice each week.  Limit saturated fats. ? Saturated fats are often found in animal products, such as meats, butter, and cream. ? Plant sources of saturated fats include palm oil, palm kernel oil, and coconut oil.  Avoid foods with partially hydrogenated oils in them. These include stick margarine, some tub margarines,  cookies, crackers, and other baked goods. These contain trans fats. What general guidelines do I need to follow?  Check food labels carefully. Identify foods with trans fats or high amounts of saturated fat.  Fill one half of your plate with vegetables and green salads. Eat 4-5 servings of vegetables per day. A serving of vegetables is: ? 1 cup of raw leafy vegetables. ?  cup of raw or cooked cut-up vegetables. ?  cup of vegetable juice.  Fill one fourth of your plate with whole grains. Look for the word "whole" as the first word in the ingredient list.  Fill one fourth of your plate with lean protein foods.  Eat 4-5 servings of fruit per day. A serving of fruit is: ? One medium whole fruit. ?  cup of dried fruit. ?  cup of fresh, frozen, or canned fruit. ?  cup of 100% fruit juice.  Eat  more foods that contain soluble fiber. These include apples, broccoli, carrots, beans, peas, and barley. Try to get 20-30 g of fiber per day.  Eat more home-cooked food. Eat less restaurant, buffet, and fast food.  Limit or avoid alcohol.  Limit foods high in starch and sugar.  Avoid fried foods.  Avoid frying your food. Try baking, boiling, grilling, or broiling it instead. You can also reduce fat by: ? Removing the skin from poultry. ? Removing all visible fats from meats. ? Skimming the fat off of stews, soups, and gravies before serving them. ? Steaming vegetables in water or broth.  Lose weight if you are overweight.  Eat 4-5 servings of nuts, legumes, and seeds per week: ? One serving of dried beans or legumes equals  cup after being cooked. ? One serving of nuts equals 1 ounces. ? One serving of seeds equals  ounce or one tablespoon.  You may need to keep track of how much salt or sodium you eat. This is especially true if you have high blood pressure. Talk with your doctor or dietitian to get more information. What foods can I eat? Grains Breads, including [place], white, pita, wheat, raisin, rye, oatmeal, and [place]. Tortillas that are neither fried nor made with lard or trans fat. Low-fat rolls, including hotdog and hamburger buns and English muffins. Biscuits. Muffins. Waffles. Pancakes. Light popcorn. Whole-grain cereals. Flatbread. Melba toast. Pretzels. Breadsticks. Rusks. Low-fat snacks. Low-fat crackers, including oyster, saltine, matzo, graham, animal, and rye. Rice and pasta, including brown rice and pastas that are made with whole wheat. Vegetables All vegetables. Fruits All fruits, but limit coconut. Meats and Other Protein Sources Lean, well-trimmed beef, veal, pork, and lamb. Chicken and [place] without skin. All fish and shellfish. Wild duck, rabbit, pheasant, and venison. Egg whites or low-cholesterol egg substitutes. Dried beans, peas, lentils, and tofu.  Seeds and most nuts. Dairy Low-fat or nonfat cheeses, including ricotta, string, and mozzarella. Skim or 1% milk that is liquid, powdered, or evaporated. Buttermilk that is made with low-fat milk. Nonfat or low-fat yogurt. Beverages Mineral water. Diet carbonated beverages. Sweets and Desserts Sherbets and fruit ices. Honey, jam, marmalade, jelly, and syrups. Meringues and gelatins. Pure sugar candy, such as hard candy, jelly beans, gumdrops, mints, marshmallows, and small amounts of dark chocolate. [employer] Eat all sweets and desserts in moderation. Fats and Oils Nonhydrogenated (trans-free) margarines. Vegetable oils, including soybean, sesame, sunflower, olive, peanut, safflower, corn, canola, and cottonseed. Salad dressings or mayonnaise made with a vegetable oil. Limit added fats and oils that you use for cooking, baking,  salads, and as spreads. Other Cocoa powder. Coffee and tea. All seasonings and condiments. The items listed above may not be a complete list of recommended foods or beverages. Contact your dietitian for more options. What foods are not recommended? Grains Breads that are made with saturated or trans fats, oils, or whole milk. Croissants. Butter rolls. Cheese breads. Sweet rolls. Donuts. Buttered popcorn. Chow mein noodles. High-fat crackers, such as cheese or butter crackers. Meats and Other Protein Sources Fatty meats, such as hotdogs, short ribs, sausage, spareribs, bacon, rib eye roast or steak, and mutton. High-fat deli meats, such as salami and bologna. Caviar. Domestic duck and goose. Organ meats, such as kidney, liver, sweetbreads, and heart. Dairy Cream, sour cream, cream cheese, and creamed cottage cheese. Whole-milk cheeses, including blue (bleu), [place], [place], [place], American, [place], Swiss, cheddar, [place], and [place]. Whole or 2% milk that is liquid, evaporated, or condensed. Whole buttermilk. Cream sauce or high-fat cheese sauce. Yogurt  that is made from whole milk. Beverages Regular sodas and juice drinks with added sugar. Sweets and Desserts Frosting. Pudding. Cookies. Cakes other than angel food cake. Candy that has milk chocolate or white chocolate, hydrogenated fat, butter, coconut, or unknown ingredients. Buttered syrups. Full-fat ice cream or ice cream drinks. Fats and Oils Gravy that has suet, meat fat, or shortening. Cocoa butter, hydrogenated oils, palm oil, coconut oil, palm kernel oil. These can often be found in baked products, candy, fried foods, nondairy creamers, and whipped toppings. Solid fats and shortenings, including bacon fat, salt pork, lard, and butter. Nondairy cream substitutes, such as coffee creamers and sour cream substitutes. Salad dressings that are made of unknown oils, cheese, or sour cream. The items listed above may not be a complete list of foods and beverages to avoid. Contact your dietitian for more information. This information is not intended to replace advice given to you by your health care provider. Make sure you discuss any questions you have with your health care provider. Document Released: 02/03/2012 Document Revised: 01/10/2016 Document Reviewed: 01/26/2014 Elsevier Interactive Patient Education  [employer].

## 2018-04-05 NOTE — Interval H&P Note (Signed)
History and Physical Interval Note:  04/05/2018 12:13 PM  [place]  has presented today for surgery, with the diagnosis of A.Fib  The various methods of treatment have been discussed with the patient and family. After consideration of risks, benefits and other options for treatment, the patient has consented to  Procedure(s): TRANSESOPHAGEAL ECHOCARDIOGRAM (TEE) (N/A) CARDIOVERSION (N/A) as a surgical intervention .  The patient's history has been reviewed, patient examined, no change in status, stable for surgery.  I have reviewed the patient's chart and labs.  Questions were answered to the patient's satisfaction.     Ena Dawley

## 2018-04-05 NOTE — Progress Notes (Signed)
[place] [place] TEAM 1 - Stepdown/ICU TEAM  MEGHANN LANDING  NKN:[id number] DOB: 10/01/1950 DOA: 04/01/2018 PCP: [facility]    Brief Narrative:  67 y.o. female with a hx of HTN and DM2 who c/o  SOB w/ exertion and lying down over the 3 weeks.  Denied palpitations or chest pain. Noticed increasing lower extremity edema not responsive to lasix newly started by her PCP.   In the ER she was found to be in A. fib with RVR.  On exam patient had elevated JVD and bilateral lower extremity edema.    Significant Events: 8/15 admit  Subjective: The patient is resting comfortably in a bedside chair having just returned from the endoscopy suite.  She was not able to undergo cardioversion as a large thrombus was found in her left atria.  She is alert and oriented.  She denies chest pain shortness of breath fevers chills nausea or vomiting.  Overall she states that she feels much better.  I have discussed with her at length the results of her TEE and our plan going forward.  Assessment & Plan:  Afib w/ RVR - newly diagnosed CHA2DS2-VASc score is at least 4 - rate currently controlled - TSH normal - cont Eliquis and BB - no DCCV due to LA thrombus   L atrial thrombus Newly appreciated today on TEE - DCCV aborted - change eliquis to full tx dose for confirmed thrombus   Suspected Diastolic CHF  EF preserved on TTE but unable to comment on diastolic dysfunction - holding diuretic at this time due to worsening renal fxn  Filed Weights   04/03/18 0540 04/04/18 0327 04/05/18 0635  Weight: 121.9 kg 123 kg 124.5 kg    Hypomagnesemia  Corrected to goal of 2.0 or greater  Acute renal failure  W/ holding of diuretic crt has now begun to improve - follow trend - renal US w/o evidence of hydronephrosis - perhaps poor CO in setting of ACEi is to blame - off ACEi - hold on further diuresis for now - avoid IV contrast or NSAIDs  Recent Labs  Lab 04/01/18 1653 04/02/18 0220  04/03/18 0459 04/04/18 0353 04/05/18 0713  CREATININE 1.86* 1.79* 2.47* 2.74* 2.05*    HTN BP at goal today   DM2 CBG controlled  Chronic Normocytic anemia Fe studies suggestive of ACD type picture - follow - baseline Hgb appears to be 8.5-9.5  Recent Labs  Lab 04/01/18 1653 04/02/18 0428 04/03/18 0459 04/04/18 0353 04/05/18 0713  HGB 9.5* 8.6* 8.7* 8.5* 8.4*    Morbid obesity - Body mass index is 47.11 kg/m.   UTI UA and clinical sx c/w UTI and cx now noting >100K gram neg rods - cont empiric abx - follow culture data   DVT prophylaxis: Eliquis  Code Status: FULL CODE Family Communication: no family present at time of exam  Disposition Plan: transition to tele status  Consultants:  none  Antimicrobials:  none   Objective: Blood pressure 125/79, pulse (!) 106, temperature 98.7 F (37.1 C), temperature source Oral, resp. rate 20, height 5' 4"  (1.626 m), weight 124.5 kg, SpO2 100 %.  Intake/Output Summary (Last 24 hours) at 04/05/2018 1434 Last data filed at 04/05/2018 1300 Gross per 24 hour  Intake 820 ml  Output 1650 ml  Net -830 ml   Filed Weights   04/03/18 0540 04/04/18 0327 04/05/18 0635  Weight: 121.9 kg 123 kg 124.5 kg    Examination: General: No acute respiratory distress - A&O  Lungs: CTA B w/o wheezing or crackles  Cardiovascular: Irreg irreg - rate controlled  Abdomen: NT/ND, soft, BS+, no mass - obese  Extremities: 1+ B LE edema  CBC: Recent Labs  Lab 04/03/18 0459 04/04/18 0353 04/05/18 0713  WBC 8.0 8.0 9.1  HGB 8.7* 8.5* 8.4*  HCT 29.2* 28.7* 29.1*  MCV 89.8 90.0 90.4  PLT 300 291 446   Basic Metabolic Panel: Recent Labs  Lab 04/01/18 2245  04/02/18 0952 04/03/18 0459 04/04/18 0353 04/05/18 0713  NA  --    < >  --  139 140 141  K  --    < >  --  4.8 4.5 4.8  CL  --    < >  --  104 107 109  CO2  --    < >  --  22 22 25   GLUCOSE  --    < >  --  186* 86 101*  BUN  --    < >  --  30* 37* 37*  CREATININE  --    < >  --   2.47* 2.74* 2.05*  CALCIUM  --    < >  --  8.4* 8.2* 8.4*  MG 1.2*  --  2.0 2.1  --   --   PHOS  --   --   --   --   --  4.9*   < > = values in this interval not displayed.   GFR: Estimated Creatinine Clearance: 34.7 mL/min (A) (by C-G formula based on SCr of 2.05 mg/dL (H)).  Liver Function Tests: Recent Labs  Lab 04/03/18 0459 04/04/18 0353 04/05/18 0713  AST 13* 11*  --   ALT 15 12  --   ALKPHOS 68 63  --   BILITOT 0.7 0.5  --   PROT 5.9* 5.5*  --   ALBUMIN 2.7* 2.6* 2.5*    Cardiac Enzymes: Recent Labs  Lab 04/01/18 2245 04/02/18 0220 04/02/18 0952  TROPONINI <0.03 <0.03 <0.03    HbA1C: Hgb A1c MFr Bld  Date/Time Value Ref Range Status  02/27/2016 07:20 AM 7.0 (H) 4.8 - 5.6 % Final    Comment:    (NOTE)         Pre-diabetes: 5.7 - 6.4         Diabetes: >6.4         Glycemic control for adults with diabetes: <7.0   11/11/2007 08:05 AM (H)  Final   9.0 (NOTE)   The ADA recommends the following therapeutic goals for glycemic   control related to Hgb A1C measurement:   Goal of Therapy:   < 7.0% Hgb A1C   Action Suggested:  > 8.0% Hgb A1C   Ref:  Diabetes Care, 22, Suppl. 1, 1999    CBG: Recent Labs  Lab 04/04/18 1630 04/04/18 2105 04/05/18 0743 04/05/18 1128 04/05/18 1315  GLUCAP 217* 205* 103* 76 86    Recent Results (from the past 240 hour(s))  MRSA PCR Screening     Status: None   Collection Time: 04/02/18  3:53 AM  Result Value Ref Range Status   MRSA by PCR NEGATIVE NEGATIVE Final    Comment:        The GeneXpert MRSA Assay (FDA approved for NASAL specimens only), is one component of a comprehensive MRSA colonization surveillance program. It is not intended to diagnose MRSA infection nor to guide or monitor treatment for MRSA infections. Performed at [facility], [address] [place],  [place] [address]   Culture, Urine     Status: Abnormal (Preliminary result)   Collection Time: 04/03/18  2:22 PM  Result Value Ref Range  Status   Specimen Description URINE, RANDOM  Final   Special Requests   Final    NONE Performed at [facility], [facility] [address] [place], [place] [address]    Culture >=100,000 COLONIES/mL GRAM NEGATIVE RODS (A)  Final   Report Status PENDING  Incomplete     Scheduled Meds: . apixaban  10 mg Oral BID  . [START ON 04/13/2018] apixaban  5 mg Oral BID  . buPROPion  300 mg Oral Daily  . fluticasone  2 spray Each Nare Daily  . insulin aspart  0-9 Units Subcutaneous TID WC  . insulin glargine  18 Units Subcutaneous Daily  . loratadine  10 mg Oral Daily  . metoprolol tartrate  25 mg Oral BID  . sodium chloride flush  3 mL Intravenous Q12H  . tiotropium  18 mcg Inhalation Daily     LOS: 4 days   Cherene Altes, MD [facility] Office  [phone] Pager - Text Page per Shea Evans  If 7PM-7AM, please contact night-coverage per Amion 04/05/2018, 2:34 PM

## 2018-04-05 NOTE — CV Procedure (Signed)
     Transesophageal Echocardiogram Note  KAZOUA GOSSEN [id number] July 17, 1951  Procedure: Transesophageal Echocardiogram Indications: atrial fibrillation  Procedure Details Consent: Obtained Time Out: Verified patient identification, verified procedure, site/side was marked, verified correct patient position, special equipment/implants available, Radiology Safety Procedures followed,  medications/allergies/relevent history reviewed, required imaging and test results available.  Performed  Medications: IV propofol was administered by anesthesia staff for sedation.   A large thrombus measuring 18 x 12 mm is seen in the left atrial apex. The cardioversion was not performed.   Complications: No apparent complications Patient did tolerate procedure well.  Ena Dawley, MD, [facility] 04/05/2018, 1:21 PM

## 2018-04-05 NOTE — Progress Notes (Signed)
ANTICOAGULATION CONSULT NOTE - Initial Consult  Pharmacy Consult for apixaban Indication: Left atrial thrombus (on TEE)  Allergies  Allergen Reactions  . Latex Itching and Rash    Patient Measurements: Height: 5' 4"  (162.6 cm) Weight: 274 lb 7.6 oz (124.5 kg) IBW/kg (Calculated) : 54.7 Heparin Dosing Weight: n/a   Vital Signs: Temp: 98.7 F (37.1 C) (08/19 1319) Temp Source: Oral (08/19 1319) BP: 125/79 (08/19 1319) Pulse Rate: 106 (08/19 1319)  Labs: Recent Labs    04/03/18 0459 04/04/18 0353 04/05/18 0713  HGB 8.7* 8.5* 8.4*  HCT 29.2* 28.7* 29.1*  PLT 300 291 292  CREATININE 2.47* 2.74* 2.05*    Estimated Creatinine Clearance: 34.7 mL/min (A) (by C-G formula based on SCr of 2.05 mg/dL (H)).   Medical History: Past Medical History:  Diagnosis Date  . Depression   . Diabetes mellitus without complication ([place])   . Hypertension   . Morbid obesity ([place])   . Osteoporosis   . UTI (lower urinary tract infection) 01/2016   Cipro for Klebsiella pneumoniae isolate    Medications:  Medications Prior to Admission  Medication Sig Dispense Refill Last Dose  . benazepril (LOTENSIN) 20 MG tablet Take 20 mg by mouth daily.  5 03/31/2018 at Unknown time  . buPROPion (WELLBUTRIN XL) 300 MG 24 hr tablet Take 300 mg by mouth daily.   04/01/2018 at am  . cetirizine (ZYRTEC) 10 MG tablet Take 10 mg by mouth at bedtime.  0 03/31/2018 at pm  . furosemide (LASIX) 20 MG tablet Take 20 mg by mouth See admin instructions. Take 20 mg by mouth for 3 days then stop for 3 days, then resume as needed for swelling of the legs  0 04/01/2018 at am  . glipiZIDE (GLUCOTROL) 10 MG tablet Take 10 mg by mouth 2 (two) times daily.  2 04/01/2018 at am  . ibuprofen (ADVIL,MOTRIN) 200 MG tablet Take 800 mg by mouth every 6 (six) hours as needed (for pain).   03/31/2018 at pm  . metFORMIN (GLUCOPHAGE-XR) 500 MG 24 hr tablet Take 1,000 mg by mouth daily with supper.  5 03/31/2018 at pm  . pioglitazone (ACTOS)  45 MG tablet Take 45 mg by mouth daily.  2 04/01/2018 at Unknown time  . SPIRIVA RESPIMAT 1.25 MCG/ACT AERS Inhale 2 sprays into the lungs daily.  2 Past Week at Unknown time  . amLODipine (NORVASC) 5 MG tablet Take 1 tablet (5 mg total) by mouth daily. (Patient not taking: Reported on 04/01/2018) 30 tablet 0 Not Taking at Unknown time  . calcium-vitamin D (OSCAL WITH D) 500-200 MG-UNIT tablet Take 1 tablet by mouth 2 (two) times daily. (Patient not taking: Reported on 04/01/2018) 30 tablet 0 Not Taking at Unknown time  . clonazePAM (KLONOPIN) 1 MG tablet Take 1 tablet (1 mg total) by mouth at bedtime. (Patient not taking: Reported on 04/01/2018) 30 tablet 5 Not Taking at Unknown time  . enoxaparin (LOVENOX) 40 MG/0.4ML injection Inject 0.4 mLs (40 mg total) into the skin daily. (Patient not taking: Reported on 04/01/2018) 14 Syringe 0 Not Taking at Unknown time    Assessment: 68 YOF who was scheduled for DCCV today. TEE prior to cardioversion showed a new left atrial thrombus. DCCV was cancelled and pharmacy consulted to start treatment dose apixaban. Patient's last dose of prophylactic apixaban was at 0851 today. H/H low stable, Plt wnl. SCr 2.05. Wt 124 kg.   Goal of Therapy:  Stroke prevention Monitor platelets by anticoagulation protocol: Yes  Plan:  -Start apixaban 10 mg twice daily x 7 days, then decrease to apixaban 5 mg twice daily  -Monitor renal fx and s/s of bleeding -Educate patient on apixaban   Albertina Parr, PharmD., BCPS Clinical Pharmacist Clinical phone for 04/05/18 until 3:30pm: [phone] If after 3:30pm, please refer to [name] for unit-specific pharmacist

## 2018-04-06 ENCOUNTER — Telehealth: Payer: Self-pay | Admitting: Cardiology

## 2018-04-06 DIAGNOSIS — B961 Klebsiella pneumoniae [K. pneumoniae] as the cause of diseases classified elsewhere: Secondary | ICD-10-CM

## 2018-04-06 DIAGNOSIS — N179 Acute kidney failure, unspecified: Secondary | ICD-10-CM

## 2018-04-06 DIAGNOSIS — N390 Urinary tract infection, site not specified: Secondary | ICD-10-CM

## 2018-04-06 LAB — GLUCOSE, CAPILLARY
GLUCOSE-CAPILLARY: 188 mg/dL — AB (ref 70–99)
GLUCOSE-CAPILLARY: 310 mg/dL — AB (ref 70–99)
Glucose-Capillary: 233 mg/dL — ABNORMAL HIGH (ref 70–99)
Glucose-Capillary: 76 mg/dL (ref 70–99)
Glucose-Capillary: 134 mg/dL — ABNORMAL HIGH (ref 70–99)

## 2018-04-06 LAB — RENAL FUNCTION PANEL
Albumin: 2.9 g/dL — ABNORMAL LOW (ref 3.5–5.0)
Anion gap: 10 (ref 5–15)
BUN: 37 mg/dL — AB (ref 8–23)
CHLORIDE: 107 mmol/L (ref 98–111)
CO2: 19 mmol/L — ABNORMAL LOW (ref 22–32)
CREATININE: 1.86 mg/dL — AB (ref 0.44–1.00)
Calcium: 8.5 mg/dL — ABNORMAL LOW (ref 8.9–10.3)
GFR calc Af Amer: 31 mL/min — ABNORMAL LOW (ref >60)
GFR, EST NON AFRICAN AMERICAN: 27 mL/min — AB (ref >60)
Glucose, Bld: 254 mg/dL — ABNORMAL HIGH (ref 70–99)
Phosphorus: 4.3 mg/dL (ref 2.5–4.6)
Potassium: 6.2 mmol/L — ABNORMAL HIGH (ref 3.5–5.1)
SODIUM: 136 mmol/L (ref 135–145)

## 2018-04-06 LAB — CBC
HCT: 33.8 % — ABNORMAL LOW (ref 36.0–46.0)
Hemoglobin: 10 g/dL — ABNORMAL LOW (ref 12.0–15.0)
MCH: 26.8 pg (ref 26.0–34.0)
MCHC: 29.6 g/dL — ABNORMAL LOW (ref 30.0–36.0)
MCV: 90.6 fL (ref 78.0–100.0)
Platelets: 350 10*3/uL (ref 150–400)
RBC: 3.73 MIL/uL — ABNORMAL LOW (ref 3.87–5.11)
RDW: 15.8 % — ABNORMAL HIGH (ref 11.5–15.5)
WBC: 8.3 10*3/uL (ref 4.0–10.5)

## 2018-04-06 LAB — POTASSIUM
POTASSIUM: 4.8 mmol/L (ref 3.5–5.1)
Potassium: 4.3 mmol/L (ref 3.5–5.1)

## 2018-04-06 LAB — URINE CULTURE

## 2018-04-06 MED ORDER — INSULIN ASPART 100 UNIT/ML IV SOLN
10.0000 [IU] | Freq: Once | INTRAVENOUS | Status: AC
  Administered 2018-04-06: 10 [IU] via INTRAVENOUS

## 2018-04-06 MED ORDER — METOPROLOL TARTRATE 25 MG PO TABS
37.5000 mg | ORAL_TABLET | Freq: Two times a day (BID) | ORAL | Status: DC
  Administered 2018-04-06 – 2018-04-07 (×2): 37.5 mg via ORAL
  Filled 2018-04-06 (×2): qty 1

## 2018-04-06 MED ORDER — DEXTROSE 50 % IV SOLN
1.0000 | Freq: Once | INTRAVENOUS | Status: AC
  Administered 2018-04-06: 50 mL via INTRAVENOUS
  Filled 2018-04-06: qty 50

## 2018-04-06 MED ORDER — SODIUM BICARBONATE 8.4 % IV SOLN
Freq: Once | INTRAVENOUS | Status: AC
  Administered 2018-04-06: 09:00:00 via INTRAVENOUS
  Filled 2018-04-06: qty 100

## 2018-04-06 MED ORDER — SODIUM POLYSTYRENE SULFONATE 15 GM/60ML PO SUSP
45.0000 g | Freq: Once | ORAL | Status: AC
  Administered 2018-04-06: 45 g via ORAL
  Filled 2018-04-06: qty 180

## 2018-04-06 MED ORDER — ALBUTEROL SULFATE (2.5 MG/3ML) 0.083% IN NEBU
10.0000 mg | INHALATION_SOLUTION | Freq: Once | RESPIRATORY_TRACT | Status: AC
  Administered 2018-04-06: 10 mg via RESPIRATORY_TRACT
  Filled 2018-04-06: qty 12

## 2018-04-06 NOTE — Progress Notes (Signed)
Progress Note  Patient Name: [name] Date of Encounter: 04/06/2018  Primary Cardiologist: New (Dr. Harrell Gave)  Subjective   Feeling better, but disappointed about her potassium being elevated this AM. Otherwise feeling well.    Inpatient Medications    Scheduled Meds: . apixaban  10 mg Oral BID  . [START ON 04/13/2018] apixaban  5 mg Oral BID  . buPROPion  300 mg Oral Daily  . fluticasone  2 spray Each Nare Daily  . insulin aspart  0-9 Units Subcutaneous TID WC  . insulin glargine  18 Units Subcutaneous Daily  . loratadine  10 mg Oral Daily  . metoprolol tartrate  25 mg Oral BID  . sodium chloride flush  3 mL Intravenous Q12H  . tiotropium  18 mcg Inhalation Daily   Continuous Infusions: . sodium chloride    . cefTRIAXone (ROCEPHIN)  IV Stopped (04/06/18 1112)  .  sodium bicarbonate  infusion 1000 mL 100 mL/hr at 04/06/18 1200   PRN Meds: acetaminophen **OR** [DISCONTINUED] acetaminophen, guaiFENesin, ondansetron **OR** ondansetron (ZOFRAN) IV, sodium chloride flush   Vital Signs    Vitals:   04/05/18 2322 04/06/18 0411 04/06/18 0738 04/06/18 0807  BP: 122/60 121/77 130/77   Pulse: (!) 115 (!) 103 (!) 104   Resp: 14 17 19    Temp:  98.2 F (36.8 C)    TempSrc:  Oral    SpO2: 97% 100% 100% 100%  Weight:  122.4 kg    Height:        Intake/Output Summary (Last 24 hours) at 04/06/2018 1317 Last data filed at 04/06/2018 1200 Gross per 24 hour  Intake 1197.06 ml  Output 800 ml  Net 397.06 ml   Filed Weights   04/04/18 0327 04/05/18 0635 04/06/18 0411  Weight: 123 kg 124.5 kg 122.4 kg    Telemetry    Atrial fibrillation with rate 90s-110s - Personally Reviewed  Physical Exam   GEN: Obese female, laying in bed in no acute distress.   Neck: difficult to assess JVD given body habitus, no carotid bruits Cardiac: IRIR, no murmurs, rubs, or gallops.  Respiratory:  no obvious wheezes/ rales/ rhonchi but difficult exam given limited mobility in bed.    GI: NABS, Soft, obese, nontender, non-distended  MS: No edema; No deformity. Neuro:  Nonfocal, moving all extremities spontaneously Psych: Normal affect   Labs    Chemistry Recent Labs  Lab 04/03/18 0459 04/04/18 0353 04/05/18 0713 04/06/18 0436 04/06/18 0953  NA 139 140 141 136  --   K 4.8 4.5 4.8 6.2* 4.8  CL 104 107 109 107  --   CO2 22 22 25  19*  --   GLUCOSE 186* 86 101* 254*  --   BUN 30* 37* 37* 37*  --   CREATININE 2.47* 2.74* 2.05* 1.86*  --   CALCIUM 8.4* 8.2* 8.4* 8.5*  --   PROT 5.9* 5.5*  --   --   --   ALBUMIN 2.7* 2.6* 2.5* 2.9*  --   AST 13* 11*  --   --   --   ALT 15 12  --   --   --   ALKPHOS 68 63  --   --   --   BILITOT 0.7 0.5  --   --   --   GFRNONAA 19* 17* 24* 27*  --   GFRAA 22* 20* 28* 31*  --   ANIONGAP 13 11 7 10   --      Hematology Recent  Labs  Lab 04/04/18 0353 04/05/18 0713 04/06/18 0436  WBC 8.0 9.1 8.3  RBC 3.19* 3.22* 3.73*  HGB 8.5* 8.4* 10.0*  HCT 28.7* 29.1* 33.8*  MCV 90.0 90.4 90.6  MCH 26.6 26.1 26.8  MCHC 29.6* 28.9* 29.6*  RDW 15.3 15.5 15.8*  PLT 291 292 350    Cardiac Enzymes Recent Labs  Lab 04/01/18 2245 04/02/18 0220 04/02/18 0952  TROPONINI <0.03 <0.03 <0.03    Recent Labs  Lab 04/01/18 1708  TROPIPOC 0.02     BNPNo results for input(s): BNP, PROBNP in the last 168 hours.   DDimer No results for input(s): DDIMER in the last 168 hours.   Radiology    US Renal  Result Date: 04/05/2018 CLINICAL DATA:  Acute renal injury EXAM: RENAL / URINARY TRACT ULTRASOUND COMPLETE COMPARISON:  None. FINDINGS: Right Kidney: Length: 8.9 cm. Echogenicity within normal limits. No mass or hydronephrosis visualized. Left Kidney: Length: 10.3 cm. Echogenicity within normal limits. No mass or hydronephrosis visualized. Bladder: Appears normal for degree of bladder distention. IMPRESSION: Difficult visualization of the kidneys due to habitus. No hydronephrosis. Electronically Signed   By: Donavan Foil M.D.   On:  04/05/2018 00:50    Cardiac Studies   Echo 8.16.19 Left ventricle: The cavity size was normal. Wall thickness was increased in a pattern of mild LVH. Systolic function was normal.The estimated ejection fraction was in the range of 55% to 60%.Although no diagnostic regional wall motion abnormality wasidentified, this possibility cannot be completely excluded on thebasis of this study. The study was not technically sufficient toallow evaluation of LV diastolic dysfunction due to atrialfibrillation. - Aortic valve: There was no stenosis. - Mitral valve: Moderately to severely calcified annulus. Mildly calcified leaflets . Elevated mean gradient across mitral but valve area by pressure half-time was normal. Probably no significant mitral stenosis. There was trivial regurgitation. Mean gradient (D): 5 mm Hg. Valve area by pressure half-time:2.58 cm^2. - Left atrium: The atrium was mildly dilated. - Right ventricle: The cavity size was normal. Systolic function was mildly reduced. - Right atrium: The atrium was mildly dilated. - Tricuspid valve: There was moderate regurgitation. Peak RV-RAgradient (S): 31 mm Hg. - Pulmonary arteries: PA peak pressure: 46 mm Hg (S). - Systemic veins: IVC measured 2.5 cm with < 50% respirophasicvariation, suggesting RA pressure 15 mmHg.  Impressions:  - The patient was in atrial fibrillation. Normal LV size with mild LV hypertrophy. EF 55-60%. Normal RV size with mildly decreasedsystolic function. Moderate pulmonary hypertension. Calcifiedmitral valve and annulus, elevated mean gradient but normalpressure half-time, probably no significant stenosis. Biatrialenlargement. Dilated IVC suggestive of elevated RV fillingpressure.  Patient Profile     67 y.o. female with three week history of DOE, orthopnea, weight gain, increasing abdominal girth but no palpitations or chest pain who presented to ER with progressive symptoms and was found  to be in afib RVR.   Assessment & Plan    1. New onset atrial fibrillation: unable to cardiovert given thrombus. Will need at least 3 weeks more of anticoagulation, then repeat TEE-CV - Continue metoprolol 19m BID - Continue apixaban 566mBID for stroke prevention  2. Acute diastolic CHF: echo with EF 55-60%. Suspect CHF is tachycardia medicated. Appears euvolemic  3. Acute on chronic CKD stage 3: Cr back to admission level - Continue to monitor Cr closely  4. HTN: BP stable on metoprolol 2591mID.  -hyperkalemic, would not restart ACEI or ARB given renal function changes and potassium level.  5. DM  type 2: - Continue management per primary team  6. Normocytic anemia: Hgb stable to uptrending.  Desaturations when sleeping: patient drops her oxygen level significantly when sleeping, and when I have seen her sleep she snores and appears to have apneic episodes. Would get an outpatient sleep study.   For questions or updates, please contact [place] Please consult [email or web address] for contact info under Cardiology/STEMI.  [facility] will sign off in anticipation of discharge.   Medication Recommendations:  Continue metoprolol and apixaban Other recommendations (labs, testing, etc):  BMET after discharge Follow up as an outpatient:  Dr. Harrell Gave at [place], Buford Dresser, MD  04/06/2018, 1:17 PM   [phone]

## 2018-04-06 NOTE — Progress Notes (Signed)
Patient ordered 60m Albuterol nebulizer treatment due to increased potassium levels this AM.  Patient tolerated treatment well with no complications noted.

## 2018-04-06 NOTE — Progress Notes (Signed)
[name]  HUO:[id number] DOB: 1950/09/19 DOA: 04/01/2018 PCP: [name], MD    Brief Narrative:  67 y.o. female with a hx of HTN and DM2 who c/o  SOB w/ exertion and lying down over the 3 weeks.  Denied palpitations or chest pain. Noticed increasing lower extremity edema not responsive to lasix newly started by her PCP.   In the ER she was found to be in A. fib with RVR.  On exam patient had elevated JVD and bilateral lower extremity edema.    Significant Events: 8/15 admit  Subjective: Patient reports is feeling exhausted, as she had multiple bowel movements today after she received Kayexalate .  Assessment & Plan:  Afib w/ RVR - newly diagnosed CHA2DS2-VASc score is at least 4 - rate currently controlled - TSH normal - cont Eliquis and BB - no DCCV due to LA thrombus  -Heart rate remains uncontrolled, currently in the 120s, I will increase her metoprolol from 25 mg twice daily to 37.5 mg twice daily. - Will need at least 3 weeks more of anticoagulation, then repeat TEE-CV per cards   L atrial thrombus Newly appreciated today on TEE - DCCV aborted - change eliquis to full tx dose for confirmed thrombus   Suspected Diastolic CHF  EF preserved on TTE but unable to comment on diastolic dysfunction - holding diuretic at this time due to worsening renal fxn  Filed Weights   04/04/18 0327 04/05/18 0635 04/06/18 0411  Weight: 123 kg 124.5 kg 122.4 kg    Hypomagnesemia  Corrected to goal of 2.0 or greater  Hyperkalemia -Potassium 6.2 this morning, received Kayexalate, D50 with IV insulin, bicarb and albuterol, has improved, it is 4.3, repeat BMP in a.m.  Acute renal failure  W/ holding of diuretic crt has now begun to improve, it is 1.86 today- follow trend - renal US w/o evidence of hydronephrosis - perhaps poor CO in setting of ACEi is to blame - off ACEi - hold on further diuresis for now - avoid IV contrast or NSAIDs  Recent Labs  Lab 04/02/18 0220  04/03/18 0459 04/04/18 0353 04/05/18 0713 04/06/18 0436  CREATININE 1.79* 2.47* 2.74* 2.05* 1.86*    HTN BP at goal today   DM2 Overall extremely labile, in the morning, most recently 76, sliding scale, and Lantus 18 units subcu daily  Chronic Normocytic anemia Fe studies suggestive of ACD type picture - follow - baseline Hgb appears to be 8.5-9.5  Recent Labs  Lab 04/02/18 0428 04/03/18 0459 04/04/18 0353 04/05/18 0713 04/06/18 0436  HGB 8.6* 8.7* 8.5* 8.4* 10.0*    Morbid obesity - Body mass index is 46.33 kg/m.   UTI UA and clinical sx c/w UTI and cx growing Klebsiella pneumonia, sensitive to Rocephin, today is #3 on Rocephin, will discontinue  DVT prophylaxis: Eliquis  Code Status: FULL CODE Family Communication: no family present at time of exam  Disposition Plan: transition to tele status  Consultants:  none  Antimicrobials:  none   Objective: Blood pressure 130/77, pulse (!) 104, temperature 98.2 F (36.8 C), temperature source Oral, resp. rate 19, height 5' 4"  (1.626 m), weight 122.4 kg, SpO2 100 %.  Intake/Output Summary (Last 24 hours) at 04/06/2018 1727 Last data filed at 04/06/2018 1344 Gross per 24 hour  Intake 1077.06 ml  Output 800 ml  Net 277.06 ml   Filed Weights   04/04/18 0327 04/05/18 0635 04/06/18 0411  Weight: 123 kg 124.5 kg 122.4 kg  Examination:  Awake Alert, Oriented X 3, No new F.N deficits, Normal affect Symmetrical Chest wall movement, Good air movement bilaterally, CTAB Irregularly irregular, tachycardic,No Gallops,Rubs or new Murmurs, No Parasternal Heave +ve B.Sounds, Abd Soft, No tenderness, No rebound - guarding or rigidity. No Cyanosis, Clubbing or edema, No new Rash or bruise    CBC: Recent Labs  Lab 04/04/18 0353 04/05/18 0713 04/06/18 0436  WBC 8.0 9.1 8.3  HGB 8.5* 8.4* 10.0*  HCT 28.7* 29.1* 33.8*  MCV 90.0 90.4 90.6  PLT 291 292 427   Basic Metabolic Panel: Recent Labs  Lab 04/01/18 2245   04/02/18 0952 04/03/18 0459 04/04/18 0353 04/05/18 0713 04/06/18 0436 04/06/18 0953 04/06/18 1457  NA  --    < >  --  139 140 141 136  --   --   K  --    < >  --  4.8 4.5 4.8 6.2* 4.8 4.3  CL  --    < >  --  104 107 109 107  --   --   CO2  --    < >  --  22 22 25  19*  --   --   GLUCOSE  --    < >  --  186* 86 101* 254*  --   --   BUN  --    < >  --  30* 37* 37* 37*  --   --   CREATININE  --    < >  --  2.47* 2.74* 2.05* 1.86*  --   --   CALCIUM  --    < >  --  8.4* 8.2* 8.4* 8.5*  --   --   MG 1.2*  --  2.0 2.1  --   --   --   --   --   PHOS  --   --   --   --   --  4.9* 4.3  --   --    < > = values in this interval not displayed.   GFR: Estimated Creatinine Clearance: 37.9 mL/min (A) (by C-G formula based on SCr of 1.86 mg/dL (H)).  Liver Function Tests: Recent Labs  Lab 04/03/18 0459 04/04/18 0353 04/05/18 0713 04/06/18 0436  AST 13* 11*  --   --   ALT 15 12  --   --   ALKPHOS 68 63  --   --   BILITOT 0.7 0.5  --   --   PROT 5.9* 5.5*  --   --   ALBUMIN 2.7* 2.6* 2.5* 2.9*    Cardiac Enzymes: Recent Labs  Lab 04/01/18 2245 04/02/18 0220 04/02/18 0952  TROPONINI <0.03 <0.03 <0.03    HbA1C: Hgb A1c MFr Bld  Date/Time Value Ref Range Status  02/27/2016 07:20 AM 7.0 (H) 4.8 - 5.6 % Final    Comment:    (NOTE)         Pre-diabetes: 5.7 - 6.4         Diabetes: >6.4         Glycemic control for adults with diabetes: <7.0   11/11/2007 08:05 AM (H)  Final   9.0 (NOTE)   The ADA recommends the following therapeutic goals for glycemic   control related to Hgb A1C measurement:   Goal of Therapy:   < 7.0% Hgb A1C   Action Suggested:  > 8.0% Hgb A1C   Ref:  Diabetes Care, 22, Suppl. 1, 1999    CBG: Recent Labs  Lab 04/05/18 2142 04/05/18 2359 04/06/18 0735 04/06/18 1111 04/06/18 1705  GLUCAP 257* 310* 233* 188* 76    Recent Results (from the past 240 hour(s))  MRSA PCR Screening     Status: None   Collection Time: 04/02/18  3:53 AM  Result Value Ref  Range Status   MRSA by PCR NEGATIVE NEGATIVE Final    Comment:        The GeneXpert MRSA Assay (FDA approved for NASAL specimens only), is one component of a comprehensive MRSA colonization surveillance program. It is not intended to diagnose MRSA infection nor to guide or monitor treatment for MRSA infections. Performed at [facility], [place] [address] [place], [place] [address]   Culture, Urine     Status: Abnormal   Collection Time: 04/03/18  2:22 PM  Result Value Ref Range Status   Specimen Description URINE, RANDOM  Final   Special Requests   Final    NONE Performed at [facility], [place] [address] [place], [place] [address]    Culture >=100,000 COLONIES/mL KLEBSIELLA PNEUMONIAE (A)  Final   Report Status 04/06/2018 FINAL  Final   Organism ID, Bacteria KLEBSIELLA PNEUMONIAE (A)  Final      Susceptibility   Klebsiella pneumoniae - MIC*    AMPICILLIN RESISTANT Resistant     CEFAZOLIN <=4 SENSITIVE Sensitive     CEFTRIAXONE <=1 SENSITIVE Sensitive     CIPROFLOXACIN <=0.25 SENSITIVE Sensitive     GENTAMICIN <=1 SENSITIVE Sensitive     IMIPENEM <=0.25 SENSITIVE Sensitive     NITROFURANTOIN <=16 SENSITIVE Sensitive     TRIMETH/SULFA <=20 SENSITIVE Sensitive     AMPICILLIN/SULBACTAM <=2 SENSITIVE Sensitive     PIP/TAZO <=4 SENSITIVE Sensitive     Extended ESBL NEGATIVE Sensitive     * >=100,000 COLONIES/mL KLEBSIELLA PNEUMONIAE     Scheduled Meds: . apixaban  10 mg Oral BID  . [START ON 04/13/2018] apixaban  5 mg Oral BID  . buPROPion  300 mg Oral Daily  . fluticasone  2 spray Each Nare Daily  . insulin aspart  0-9 Units Subcutaneous TID WC  . insulin glargine  18 Units Subcutaneous Daily  . loratadine  10 mg Oral Daily  . metoprolol tartrate  37.5 mg Oral BID  . sodium chloride flush  3 mL Intravenous Q12H  . tiotropium  18 mcg Inhalation Daily     LOS: 5 days   Phillips Climes MD Pager [phone] [facility] Office  [phone] Pager -  Text Page per Amion  If 7PM-7AM, please contact night-coverage per Amion 04/06/2018, 5:27 PM

## 2018-04-06 NOTE — Progress Notes (Signed)
Inpatient Diabetes Program Recommendations  AACE/ADA: New Consensus Statement on Inpatient Glycemic Control (2015)  Target Ranges:  Prepandial:   less than 140 mg/dL      Peak postprandial:   less than 180 mg/dL (1-2 hours)      Critically ill patients:  140 - 180 mg/dL   Lab Results  Component Value Date   GLUCAP 233 (H) 04/06/2018   HGBA1C 7.0 (H) 02/27/2016   Review of Glycemic Control  Diabetes history: DM 2 Outpatient Diabetes medications: Glipizide 10 mg BID, Metformin 1000 mg Daily at supper, Actos 45 mg Daily Current orders for Inpatient glycemic control: Lantus 18 units, Novolog 0-9 units tid  Inpatient Diabetes Program Recommendations:    Glucose is 233 mg/dl this am. Patient did not recieve Lantus dose yesterday. Watch trends on current regimen. Lantus scheduled for patient this am.  Thanks,  Tama Headings RN, MSN, BC-ADM Inpatient Diabetes Coordinator Team Pager [phone] (8a-5p)

## 2018-04-06 NOTE — Telephone Encounter (Signed)
New Message:    TOC Per Jasmine Awe  04/23/18 at 8:40

## 2018-04-07 LAB — BASIC METABOLIC PANEL
Anion gap: 8 (ref 5–15)
BUN: 27 mg/dL — ABNORMAL HIGH (ref 8–23)
CO2: 24 mmol/L (ref 22–32)
Calcium: 8.3 mg/dL — ABNORMAL LOW (ref 8.9–10.3)
Chloride: 108 mmol/L (ref 98–111)
Creatinine, Ser: 1.22 mg/dL — ABNORMAL HIGH (ref 0.44–1.00)
GFR calc Af Amer: 52 mL/min — ABNORMAL LOW (ref >60)
GFR calc non Af Amer: 45 mL/min — ABNORMAL LOW (ref >60)
Glucose, Bld: 180 mg/dL — ABNORMAL HIGH (ref 70–99)
Potassium: 4.2 mmol/L (ref 3.5–5.1)
Sodium: 140 mmol/L (ref 135–145)

## 2018-04-07 LAB — CBC
HCT: 33.1 % — ABNORMAL LOW (ref 36.0–46.0)
Hemoglobin: 9.5 g/dL — ABNORMAL LOW (ref 12.0–15.0)
MCH: 26.2 pg (ref 26.0–34.0)
MCHC: 28.7 g/dL — ABNORMAL LOW (ref 30.0–36.0)
MCV: 91.4 fL (ref 78.0–100.0)
Platelets: 319 10*3/uL (ref 150–400)
RBC: 3.62 MIL/uL — ABNORMAL LOW (ref 3.87–5.11)
RDW: 15.6 % — ABNORMAL HIGH (ref 11.5–15.5)
WBC: 7.5 10*3/uL (ref 4.0–10.5)

## 2018-04-07 LAB — GLUCOSE, CAPILLARY
GLUCOSE-CAPILLARY: 189 mg/dL — AB (ref 70–99)
GLUCOSE-CAPILLARY: 142 mg/dL — AB (ref 70–99)
Glucose-Capillary: 192 mg/dL — ABNORMAL HIGH (ref 70–99)
Glucose-Capillary: 151 mg/dL — ABNORMAL HIGH (ref 70–99)

## 2018-04-07 MED ORDER — FUROSEMIDE 40 MG PO TABS
40.0000 mg | ORAL_TABLET | Freq: Every day | ORAL | Status: DC
  Administered 2018-04-07 – 2018-04-08 (×2): 40 mg via ORAL
  Filled 2018-04-07 (×2): qty 1

## 2018-04-07 MED ORDER — METOPROLOL TARTRATE 50 MG PO TABS
75.0000 mg | ORAL_TABLET | Freq: Two times a day (BID) | ORAL | Status: DC
  Administered 2018-04-07 – 2018-04-08 (×2): 75 mg via ORAL
  Filled 2018-04-07 (×2): qty 1

## 2018-04-07 MED ORDER — METOPROLOL TARTRATE 50 MG PO TABS
50.0000 mg | ORAL_TABLET | Freq: Once | ORAL | Status: AC
  Administered 2018-04-07: 50 mg via ORAL
  Filled 2018-04-07: qty 1

## 2018-04-07 NOTE — Discharge Summary (Deleted)
Discharge Summary  [name] DXI:[id number] DOB: 24-Jun-1951  PCP: [name], MD  Admit date: 04/01/2018 Discharge date:04/08/2018   Time spent: < 25 minutes  Admitted From: Home Disposition: Home  Recommendations for Outpatient Follow-up:  1. Follow up with PCP in 1 to 2 weeks 2. Continue metoprolol and apixaban 3. Discontinued ACEi change in renal function per cardiology 4. BMP on disharge 5. Follow up with Dr. Harrell Gave at [facility] as outpatient     Discharge Diagnoses:  Active Hospital Problems   Diagnosis Date Noted  . Acute CHF (congestive heart failure) ([place]) 04/01/2018  . SOB (shortness of breath)   . Atrial fibrillation with RVR ([place]) 04/01/2018  . Acute renal insufficiency 02/26/2016  . Diabetes type 2, uncontrolled ([place]) 02/19/2016  . HTN (hypertension), benign 02/19/2016    Resolved Hospital Problems  No resolved problems to display.    Discharge Condition: Stable  CODE STATUS: Full code  History of present illness:  [name] is a 67 y.o. year old female with medical history significant for HTN, type 2 diabetes newly diagnosed A. fib and systolic heart failure who presented on 04/01/2018 with worsening shortness of breath with exertion and lying down over the past 3 weeks with worsening lower extremity swelling not responsive to oral Lasix started by her cardiologist as an outpatient and was found to have A. fib with RVR and acute diastolic CHF exacerbation.  Found to have LV thrombus on TEE on 04/05/2018 so no cardioversion was attempted.. Remaining hospital course addressed in problem based format below:   Hospital Course:   New onset atrial fibrillation with RVR, rate controlled on discharge.  Initially required diltiazem infusion at 15 mg an hour.  She was transitioned to metoprolol 75 mg twice daily with cardiology assistance.  TTE showed preserved EF of 55 to 60%.  TEE on 8/19 for planned cardioversion showed large thrombus in  left atrial apex so cardioversion was not performed.  Discharged on apixaban at increased dose of 10 mg twice daily, then start 5 mg twice daily on 8/27 for stroke prevention and thrombus.  Cardiology will consider repeating TEE/cardioversion after 3 to 4 weeks of anticoagulation  Acute on chronic diastolic CHF.  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  Required intermittent IV dosing of Lasix and was sent home on daily furosemide 40 mg.  In the house during hospital stay were not accurate weight fluctuated throughout admission as well.  AKI on CKD.  Peak creatinine of 2.74 presumed related to diuresis.  Much improved prior to discharge creatinine of 1.22.  Patient's renal ultrasound showed no evidence of hydronephrosis.  Will need repeat BMP at PCP follow-up.  Discontinued home ACE inhibitor due to change in renal function.  Nocturnal oxygen desaturation.  Presumed likely undiagnosed OSA.  Will need outpatient sleep study.  Type 2 diabetes.  Was maintained on Lantus and sliding scale here.  On discharge can resume oral hypoglycemics.  Will need close monitoring and follow-up with PCP.  Hyperkalemia, resolved.  Peak potassium of 6.2.  Resolved status post Kayexalate and other temporizing measures on admission.    Consultations:  Cardiology  Procedures/Studies:  TEE, 04/05/2018.  Large thrombus measuring 18 x 12 mm in the left atrial apex  TTE, 04/02/2018, EF 55-60%, moderate pulmonary hypertension    Discharge Exam: BP 122/77 (BP Location: Left Wrist)   Pulse 97   Temp (!) 97.5 F (36.4 C) (Oral)   Resp 20   Ht 5' 4"  (1.626 m)   Wt 120.9  kg   SpO2 97%   BMI 45.76 kg/m   Constitutional:normal appearing female Eyes: EOMI, anicteric, normal conjunctivae ENMT: Oropharynx with moist mucous membranes, normal dentition Cardiovascular: Irregularly irregular rhythm, normal rate no MRGs, with no peripheral edema Respiratory: Normal respiratory effort on room air, clear breath sounds    Abdomen: Soft,non-tender,  Skin: No rash ulcers, or lesions. Without skin tenting  Neurologic: Grossly no focal neuro deficit. Psychiatric:Appropriate affect, and mood. Mental status AAOx3   Discharge Instructions You were cared for by a hospitalist during your hospital stay. If you have any questions about your discharge medications or the care you received while you were in the hospital after you are discharged, you can call the unit and asked to speak with the hospitalist on call if the hospitalist that took care of you is not available. Once you are discharged, your primary care physician will handle any further medical issues. Please note that NO REFILLS for any discharge medications will be authorized once you are discharged, as it is imperative that you return to your primary care physician (or establish a relationship with a primary care physician if you do not have one) for your aftercare needs so that they can reassess your need for medications and monitor your lab values.  Discharge Instructions    Call MD for:  temperature >100.4   Complete by:  As directed    Diet - low sodium heart healthy   Complete by:  As directed    Diet - low sodium heart healthy   Complete by:  As directed    Heart Failure patients record your daily weight using the same scale at the same time of day   Complete by:  As directed    Increase activity slowly   Complete by:  As directed    Increase activity slowly   Complete by:  As directed      Allergies as of 04/08/2018      Reactions   Latex Itching, Rash      Medication List    STOP taking these medications   amLODipine 5 MG tablet Commonly known as:  NORVASC   benazepril 20 MG tablet Commonly known as:  LOTENSIN   calcium-vitamin D 500-200 MG-UNIT tablet Commonly known as:  OSCAL WITH D   clonazePAM 1 MG tablet Commonly known as:  KLONOPIN   enoxaparin 40 MG/0.4ML injection Commonly known as:  LOVENOX   ibuprofen 200 MG  tablet Commonly known as:  ADVIL,MOTRIN   pioglitazone 45 MG tablet Commonly known as:  ACTOS     TAKE these medications   apixaban 5 MG Tabs tablet Commonly known as:  ELIQUIS Take 2 tablets (10 mg total) by mouth 2 (two) times daily for 4 days.   apixaban 5 MG Tabs tablet Commonly known as:  ELIQUIS Take 1 tablet (5 mg total) by mouth 2 (two) times daily. Start taking on:  04/13/2018   buPROPion 300 MG 24 hr tablet Commonly known as:  WELLBUTRIN XL Take 300 mg by mouth daily.   cetirizine 10 MG tablet Commonly known as:  ZYRTEC Take 10 mg by mouth daily as needed for allergies.   furosemide 40 MG tablet Commonly known as:  LASIX Take 1 tablet (40 mg total) by mouth daily. What changed:    medication strength  how much to take  when to take this  additional instructions   glipiZIDE 10 MG tablet Commonly known as:  GLUCOTROL Take 10 mg by mouth 2 (two) times  daily.   metFORMIN 500 MG 24 hr tablet Commonly known as:  GLUCOPHAGE-XR Take 1,000 mg by mouth daily with supper.   Metoprolol Tartrate 75 MG Tabs Take 75 mg by mouth 2 (two) times daily.   SPIRIVA RESPIMAT 1.25 MCG/ACT Aers Generic drug:  Tiotropium Bromide Monohydrate Inhale 2 sprays into the lungs daily.      Allergies  Allergen Reactions  . Latex Itching and Rash   Follow-up Information    Dr. Buford Dresser Follow up on 04/23/2018.   Why:  Please arrive 15 minutes early for your 8:40am post-hospital follow-up appointment Contact information: [facility] [address] [place] [address] [phone]       Dr. Buford Dresser Follow up on 05/06/2018.   Why:  Please arrive 15 minutes early for your 1:40pm cardiology appointment Contact information: [facility] [address] [place] [address] [place] Follow up.   Specialty:  Home Health Services Why:  Physical  Therapy Contact information: [address] [place] [address] [phone]            The results of significant diagnostics from this hospitalization (including imaging, microbiology, ancillary and laboratory) are listed below for reference.    Significant Diagnostic Studies: Dg Chest 2 View  Result Date: 04/10/2018 CLINICAL DATA:  Recent diagnosis of atrial fibrillation. Chest pain and shortness of breath. EXAM: CHEST - 2 VIEW COMPARISON:  April 01, 2018 FINDINGS: Stable cardiomegaly. The hila and mediastinum are unchanged. No pneumothorax. No pulmonary nodules or masses. No focal infiltrates. No overt edema. IMPRESSION: Cardiomegaly without overt edema. Electronically Signed   By: Dorise Bullion III M.D   On: 04/10/2018 14:00   Dg Chest 2 View  Result Date: 04/01/2018 CLINICAL DATA:  Shortness of breath EXAM: CHEST - 2 VIEW COMPARISON:  03/29/2018, 02/19/2016 FINDINGS: Mild cardiomegaly with vascular congestion and mild diffuse interstitial opacities suggesting minimal edema. No pleural effusion. Possible small anterior lower lobe infiltrate on the lateral view. Aortic atherosclerosis. No pneumothorax. IMPRESSION: 1. Cardiomegaly with vascular congestion and mild interstitial edema 2. Possible small anterior lung base infiltrate on the lateral view. Electronically Signed   By: Donavan Foil M.D.   On: 04/01/2018 18:48   Dg Chest 2 View  Result Date: 03/29/2018 CLINICAL DATA:  Acute shortness of breath with cough for 1 week. EXAM: CHEST - 2 VIEW COMPARISON:  02/19/2016 FINDINGS: Cardiomegaly and pulmonary vascular congestion noted. There is no evidence of focal airspace disease,, pulmonary edema, suspicious pulmonary nodule/mass, pleural effusion, or pneumothorax. No acute bony abnormalities are identified. IMPRESSION: Cardiomegaly with pulmonary vascular congestion. Electronically Signed   By: Margarette Canada M.D.   On: 03/29/2018 14:16   US Renal  Result Date:  04/05/2018 CLINICAL DATA:  Acute renal injury EXAM: RENAL / URINARY TRACT ULTRASOUND COMPLETE COMPARISON:  None. FINDINGS: Right Kidney: Length: 8.9 cm. Echogenicity within normal limits. No mass or hydronephrosis visualized. Left Kidney: Length: 10.3 cm. Echogenicity within normal limits. No mass or hydronephrosis visualized. Bladder: Appears normal for degree of bladder distention. IMPRESSION: Difficult visualization of the kidneys due to habitus. No hydronephrosis. Electronically Signed   By: Donavan Foil M.D.   On: 04/05/2018 00:50    Microbiology: Recent Results (from the past 240 hour(s))  MRSA PCR Screening     Status: None   Collection Time: 04/02/18  3:53 AM  Result Value Ref Range Status   MRSA by PCR NEGATIVE  NEGATIVE Final    Comment:        The GeneXpert MRSA Assay (FDA approved for NASAL specimens only), is one component of a comprehensive MRSA colonization surveillance program. It is not intended to diagnose MRSA infection nor to guide or monitor treatment for MRSA infections. Performed at [facility], [place] [address], [place], [place] [address]   Culture, Urine     Status: Abnormal   Collection Time: 04/03/18  2:22 PM  Result Value Ref Range Status   Specimen Description URINE, RANDOM  Final   Special Requests   Final    NONE Performed at [facility], [place] [address] [place], [place] [address]    Culture >=100,000 COLONIES/mL KLEBSIELLA PNEUMONIAE (A)  Final   Report Status 04/06/2018 FINAL  Final   Organism ID, Bacteria KLEBSIELLA PNEUMONIAE (A)  Final      Susceptibility   Klebsiella pneumoniae - MIC*    AMPICILLIN RESISTANT Resistant     CEFAZOLIN <=4 SENSITIVE Sensitive     CEFTRIAXONE <=1 SENSITIVE Sensitive     CIPROFLOXACIN <=0.25 SENSITIVE Sensitive     GENTAMICIN <=1 SENSITIVE Sensitive     IMIPENEM <=0.25 SENSITIVE Sensitive     NITROFURANTOIN <=16 SENSITIVE Sensitive     TRIMETH/SULFA <=20 SENSITIVE Sensitive     AMPICILLIN/SULBACTAM  <=2 SENSITIVE Sensitive     PIP/TAZO <=4 SENSITIVE Sensitive     Extended ESBL NEGATIVE Sensitive     * >=100,000 COLONIES/mL KLEBSIELLA PNEUMONIAE     Labs: Basic Metabolic Panel: Recent Labs  Lab 04/05/18 0713 04/06/18 0436 04/06/18 0953 04/06/18 1457 04/07/18 0341 04/08/18 0903 04/10/18 1315  NA 141 136  --   --  140 142 141  K 4.8 6.2* 4.8 4.3 4.2 4.2 4.1  CL 109 107  --   --  108 109 101  CO2 25 19*  --   --  24 25 27   GLUCOSE 101* 254*  --   --  180* 182* 238*  BUN 37* 37*  --   --  27* 24* 30*  CREATININE 2.05* 1.86*  --   --  1.22* 1.16* 1.89*  CALCIUM 8.4* 8.5*  --   --  8.3* 8.7* 8.3*  PHOS 4.9* 4.3  --   --   --   --   --    Liver Function Tests: Recent Labs  Lab 04/04/18 0353 04/05/18 0713 04/06/18 0436  AST 11*  --   --   ALT 12  --   --   ALKPHOS 63  --   --   BILITOT 0.5  --   --   PROT 5.5*  --   --   ALBUMIN 2.6* 2.5* 2.9*   No results for input(s): LIPASE, AMYLASE in the last 168 hours. No results for input(s): AMMONIA in the last 168 hours. CBC: Recent Labs  Lab 04/05/18 0713 04/06/18 0436 04/07/18 0341 04/08/18 0415 04/10/18 1315  WBC 9.1 8.3 7.5 6.8 9.0  HGB 8.4* 10.0* 9.5* 8.8* 9.2*  HCT 29.1* 33.8* 33.1* 30.4* 31.6*  MCV 90.4 90.6 91.4 91.6 92.1  PLT 292 350 319 283 306   Cardiac Enzymes: No results for input(s): CKTOTAL, CKMB, CKMBINDEX, TROPONINI in the last 168 hours. BNP: BNP (last 3 results) No results for input(s): BNP in the last 8760 hours.  ProBNP (last 3 results) No results for input(s): PROBNP in the last 8760 hours.  CBG: Recent Labs  Lab 04/07/18 1143 04/07/18 1730 04/07/18 2125 04/08/18 8325  04/08/18 1152  GLUCAP 192* 151* 142* 154* 180*       Signed:  Desiree Hane, MD [facility] 04/10/2018, 2:51 PM

## 2018-04-07 NOTE — Discharge Summary (Signed)
Discharge Summary  [name] UDJ:[id number] DOB: Nov 11, 1950  PCP: [name], MD  Admit date: 04/01/2018 Discharge date: 04/08/2018   Time spent: < 25 minutes  Admitted From: Home  Disposition: Home  Recommendations for Outpatient Follow-up:  1. Follow up with PCP in 1 to 2 weeks 2. Apixaban at 10 mg twice daily until 8/26 followed by 5 mg twice daily starting 8/27 for stroke prevention and LV thrombus 3. Will follow with cardiology to discuss TEE/DCCV after 3 to 4 weeks of anticoagulation as outpatient 4. Follow-up BMP at PCP office in 1 week 5. Holding home benazepril in setting of AKI, can reconsider resuming depending on kidney function on repeat BMP as outpatient 6. Outpatient sleep study for possible OSA    Discharge Diagnoses:  Active Hospital Problems   Diagnosis Date Noted  . Acute CHF (congestive heart failure) ([place]) 04/01/2018  . SOB (shortness of breath)   . Atrial fibrillation with RVR ([place]) 04/01/2018  . Acute renal insufficiency 02/26/2016  . Diabetes type 2, uncontrolled ([place]) 02/19/2016  . HTN (hypertension), benign 02/19/2016    Resolved Hospital Problems  No resolved problems to display.    Discharge Condition: Stable   CODE STATUS:Full Code  Diet recommendation: Heart healthy    History of present illness:  [name] is a 67 y.o. year old female with medical history significant for HTN, type 2 diabetes newly diagnosed A. fib and systolic heart failure who presented on 04/01/2018 with worsening shortness of breath with exertion and lying down over the past 3 weeks with worsening lower extremity swelling not responsive to oral Lasix started by her cardiologist as an outpatient and was found to have A. fib with RVR and acute diastolic CHF exacerbation.  Found to have LV thrombus on TEE on 04/05/2018 so no cardioversion was attempted.  Remaining hospital course addressed in problem based format below:   Hospital Course:   New onset  atrial fibrillation with RVR, now rate controlled.  Underwent TEE on 04/05/2018 but was unable to do cardioversion due to thrombus in left atrial appendage.  Patient metoprolol was increased to 75 mg twice daily with good rate control.  We will continue Eliquis 10 mg twice daily until 8/27 and start 5 mg twice daily.  Will follow with cardiology after 3 to 4 weeks of anticoagulants and to discuss TEE/cardioversion  Acute on chronic diastolic CHF, stable.  Recently diagnosed with diastolic CHF prior to this admission.  TTE shows preserved EF.  Suspect likely related to tachycardia.  Patient did well with Lasix diuresis, unfortunately weights and intake/output not accurate during admission.  Very minimal lower leg swelling on discharge, 20 supplemental oxygen no crackles heard on exam. Will continue daily oral Lasix on discharge and follow-up with cardiology as mentioned above.  AKI on CKD.  Likely prerenal etiology in the setting of decreased effective arterial flow related to CHF exacerbation.  Peak creatinine of 2.74 baseline 1.16 on discharge.  Will have follow-up BMP at PCPs office  Hypertension, at goal.  Held home benazepril in the setting of AKI.  Can consider restarting ACE if repeat BMP as outpatient (at PCP follow-up) shows stable/normal kidney function.  Home amlodipine was also held in setting of CHF medication as mentioned above.  Type 2 diabetes, stable.  CBGs were monitored and maintained on Lantus throughout stay.  Will resume home oral hypoglycemics.  Follow-up with PCP oxygen desaturation.  Presumed likely undiagnosed OSA.  Will need outpatient sleep study  Hyperkalemia, resolved.  Peak potassium 6.2 with recovery to normal after Kayexalate and other temporizing measures.  UTI secondary to Klebsiella pneumonia, resolved.  Received 2 days of IV Rocephin.  Symptoms resolved.   Consultations:  Cardiology  Procedures/Studies: 04/05/2018 TEE  Study Conclusions  - Left ventricle:  Systolic function was normal. The estimated   ejection fraction was in the range of 55% to 60%. Wall motion was   normal; there were no regional wall motion abnormalities. - Mitral valve: There was mild regurgitation. - Left atrium: The atrium was moderately dilated. No evidence of   thrombus in the atrial cavity or appendage. No evidence of   thrombus in the atrial cavity or appendage. - Right ventricle: The cavity size was mildly dilated. Wall   thickness was normal. Systolic function was mildly reduced. - Right atrium: The atrium was moderately dilated. No evidence of   thrombus in the atrial cavity or appendage. - Tricuspid valve: There was moderate-severe regurgitation. - Pulmonary arteries: Systolic pressure was moderately increased.   PA peak pressure: 47 mm Hg (S).  Impressions:  - A large thrombus measuring 18 x 12 mm is seen in the left atrial   apex. The cardioversion was not performed  Discharge Exam: BP 122/77 (BP Location: Left Wrist)   Pulse 97   Temp (!) 97.5 F (36.4 C) (Oral)   Resp 20   Ht 5' 4"  (1.626 m)   Wt 120.9 kg   SpO2 97%   BMI 45.76 kg/m   General: Lying in bed, no apparent distress Eyes: EOMI, anicteric ENT: Oral Mucosa clear and moist Cardiovascular: Irregularly irregular rhythm, normal rate, no murmurs, rubs or gallops, no edema, Respiratory: Normal respiratory effort on room air, lungs clear to auscultation bilaterally Abdomen: soft, non-distended, non-tender, normal bowel sounds Skin: No Rash Neurologic: Grossly no focal neuro deficit.Mental status AAOx3, speech normal, Psychiatric:Appropriate affect, and mood   Discharge Instructions You were cared for by a hospitalist during your hospital stay. If you have any questions about your discharge medications or the care you received while you were in the hospital after you are discharged, you can call the unit and asked to speak with the hospitalist on call if the hospitalist that took care  of you is not available. Once you are discharged, your primary care physician will handle any further medical issues. Please note that NO REFILLS for any discharge medications will be authorized once you are discharged, as it is imperative that you return to your primary care physician (or establish a relationship with a primary care physician if you do not have one) for your aftercare needs so that they can reassess your need for medications and monitor your lab values.  Discharge Instructions    Call MD for:  temperature >100.4   Complete by:  As directed    Diet - low sodium heart healthy   Complete by:  As directed    Diet - low sodium heart healthy   Complete by:  As directed    Heart Failure patients record your daily weight using the same scale at the same time of day   Complete by:  As directed    Increase activity slowly   Complete by:  As directed    Increase activity slowly   Complete by:  As directed      Allergies as of 04/08/2018      Reactions   Latex Itching, Rash      Medication List    STOP taking these medications  amLODipine 5 MG tablet Commonly known as:  NORVASC   benazepril 20 MG tablet Commonly known as:  LOTENSIN   calcium-vitamin D 500-200 MG-UNIT tablet Commonly known as:  OSCAL WITH D   clonazePAM 1 MG tablet Commonly known as:  KLONOPIN   enoxaparin 40 MG/0.4ML injection Commonly known as:  LOVENOX   ibuprofen 200 MG tablet Commonly known as:  ADVIL,MOTRIN   pioglitazone 45 MG tablet Commonly known as:  ACTOS     TAKE these medications   apixaban 5 MG Tabs tablet Commonly known as:  ELIQUIS Take 2 tablets (10 mg total) by mouth 2 (two) times daily for 4 days.   apixaban 5 MG Tabs tablet Commonly known as:  ELIQUIS Take 1 tablet (5 mg total) by mouth 2 (two) times daily. Start taking on:  04/13/2018   buPROPion 300 MG 24 hr tablet Commonly known as:  WELLBUTRIN XL Take 300 mg by mouth daily.   cetirizine 10 MG tablet Commonly  known as:  ZYRTEC Take 10 mg by mouth at bedtime.   furosemide 40 MG tablet Commonly known as:  LASIX Take 1 tablet (40 mg total) by mouth daily. What changed:    medication strength  how much to take  when to take this  additional instructions   glipiZIDE 10 MG tablet Commonly known as:  GLUCOTROL Take 10 mg by mouth 2 (two) times daily.   metFORMIN 500 MG 24 hr tablet Commonly known as:  GLUCOPHAGE-XR Take 1,000 mg by mouth daily with supper.   Metoprolol Tartrate 75 MG Tabs Take 75 mg by mouth 2 (two) times daily.   SPIRIVA RESPIMAT 1.25 MCG/ACT Aers Generic drug:  Tiotropium Bromide Monohydrate Inhale 2 sprays into the lungs daily.      Allergies  Allergen Reactions  . Latex Itching and Rash   Follow-up Information    Dr. Buford Dresser Follow up on 04/23/2018.   Why:  Please arrive 15 minutes early for your 8:40am post-hospital follow-up appointment Contact information: [facility] [address] [place] [address] [phone]       Dr. Buford Dresser Follow up on 05/06/2018.   Why:  Please arrive 15 minutes early for your 1:40pm cardiology appointment Contact information: [facility] [address] [place] [address] [place] Follow up.   Specialty:  Home Health Services Why:  Physical Therapy Contact information: [address] [place] [address] [phone]            The results of significant diagnostics from this hospitalization (including imaging, microbiology, ancillary and laboratory) are listed below for reference.    Significant Diagnostic Studies: Dg Chest 2 View  Result Date: 04/01/2018 CLINICAL DATA:  Shortness of breath EXAM: CHEST - 2 VIEW COMPARISON:  03/29/2018, 02/19/2016 FINDINGS: Mild cardiomegaly with vascular congestion and mild diffuse interstitial opacities suggesting minimal edema. No  pleural effusion. Possible small anterior lower lobe infiltrate on the lateral view. Aortic atherosclerosis. No pneumothorax. IMPRESSION: 1. Cardiomegaly with vascular congestion and mild interstitial edema 2. Possible small anterior lung base infiltrate on the lateral view. Electronically Signed   By: Donavan Foil M.D.   On: 04/01/2018 18:48   Dg Chest 2 View  Result Date: 03/29/2018 CLINICAL DATA:  Acute shortness of breath with cough for 1 week. EXAM: CHEST - 2 VIEW COMPARISON:  02/19/2016 FINDINGS: Cardiomegaly and pulmonary vascular congestion noted. There is no evidence of focal airspace disease,,  pulmonary edema, suspicious pulmonary nodule/mass, pleural effusion, or pneumothorax. No acute bony abnormalities are identified. IMPRESSION: Cardiomegaly with pulmonary vascular congestion. Electronically Signed   By: Margarette Canada M.D.   On: 03/29/2018 14:16   US Renal  Result Date: 04/05/2018 CLINICAL DATA:  Acute renal injury EXAM: RENAL / URINARY TRACT ULTRASOUND COMPLETE COMPARISON:  None. FINDINGS: Right Kidney: Length: 8.9 cm. Echogenicity within normal limits. No mass or hydronephrosis visualized. Left Kidney: Length: 10.3 cm. Echogenicity within normal limits. No mass or hydronephrosis visualized. Bladder: Appears normal for degree of bladder distention. IMPRESSION: Difficult visualization of the kidneys due to habitus. No hydronephrosis. Electronically Signed   By: Donavan Foil M.D.   On: 04/05/2018 00:50    Microbiology: Recent Results (from the past 240 hour(s))  MRSA PCR Screening     Status: None   Collection Time: 04/02/18  3:53 AM  Result Value Ref Range Status   MRSA by PCR NEGATIVE NEGATIVE Final    Comment:        The GeneXpert MRSA Assay (FDA approved for NASAL specimens only), is one component of a comprehensive MRSA colonization surveillance program. It is not intended to diagnose MRSA infection nor to guide or monitor treatment for MRSA infections. Performed at [facility], [place] [address], [place], [place] [address]   Culture, Urine     Status: Abnormal   Collection Time: 04/03/18  2:22 PM  Result Value Ref Range Status   Specimen Description URINE, RANDOM  Final   Special Requests   Final    NONE Performed at [facility], [place] [address] [place],  [address]    Culture >=100,000 COLONIES/mL KLEBSIELLA PNEUMONIAE (A)  Final   Report Status 04/06/2018 FINAL  Final   Organism ID, Bacteria KLEBSIELLA PNEUMONIAE (A)  Final      Susceptibility   Klebsiella pneumoniae - MIC*    AMPICILLIN RESISTANT Resistant     CEFAZOLIN <=4 SENSITIVE Sensitive     CEFTRIAXONE <=1 SENSITIVE Sensitive     CIPROFLOXACIN <=0.25 SENSITIVE Sensitive     GENTAMICIN <=1 SENSITIVE Sensitive     IMIPENEM <=0.25 SENSITIVE Sensitive     NITROFURANTOIN <=16 SENSITIVE Sensitive     TRIMETH/SULFA <=20 SENSITIVE Sensitive     AMPICILLIN/SULBACTAM <=2 SENSITIVE Sensitive     PIP/TAZO <=4 SENSITIVE Sensitive     Extended ESBL NEGATIVE Sensitive     * >=100,000 COLONIES/mL KLEBSIELLA PNEUMONIAE     Labs: Basic Metabolic Panel: Recent Labs  Lab 04/01/18 2245  04/02/18 0952 04/03/18 0459 04/04/18 0353 04/05/18 0713 04/06/18 0436 04/06/18 0953 04/06/18 1457 04/07/18 0341 04/08/18 0903  NA  --    < >  --  139 140 141 136  --   --  140 142  K  --    < >  --  4.8 4.5 4.8 6.2* 4.8 4.3 4.2 4.2  CL  --    < >  --  104 107 109 107  --   --  108 109  CO2  --    < >  --  22 22 25  19*  --   --  24 25  GLUCOSE  --    < >  --  186* 86 101* 254*  --   --  180* 182*  BUN  --    < >  --  30* 37* 37* 37*  --   --  27* 24*  CREATININE  --    < >  --  2.47* 2.74* 2.05* 1.86*  --   --  1.22* 1.16*  CALCIUM  --    < >  --  8.4* 8.2* 8.4* 8.5*  --   --  8.3* 8.7*  MG 1.2*  --  2.0 2.1  --   --   --   --   --   --   --   PHOS  --   --   --   --   --  4.9* 4.3  --   --   --   --    < > = values in this interval not displayed.   Liver Function Tests: Recent Labs  Lab  04/03/18 0459 04/04/18 0353 04/05/18 0713 04/06/18 0436  AST 13* 11*  --   --   ALT 15 12  --   --   ALKPHOS 68 63  --   --   BILITOT 0.7 0.5  --   --   PROT 5.9* 5.5*  --   --   ALBUMIN 2.7* 2.6* 2.5* 2.9*   No results for input(s): LIPASE, AMYLASE in the last 168 hours. No results for input(s): AMMONIA in the last 168 hours. CBC: Recent Labs  Lab 04/04/18 0353 04/05/18 0713 04/06/18 0436 04/07/18 0341 04/08/18 0415  WBC 8.0 9.1 8.3 7.5 6.8  HGB 8.5* 8.4* 10.0* 9.5* 8.8*  HCT 28.7* 29.1* 33.8* 33.1* 30.4*  MCV 90.0 90.4 90.6 91.4 91.6  PLT 291 292 350 319 283   Cardiac Enzymes: Recent Labs  Lab 04/01/18 2245 04/02/18 0220 04/02/18 0952  TROPONINI <0.03 <0.03 <0.03   BNP: BNP (last 3 results) No results for input(s): BNP in the last 8760 hours.  ProBNP (last 3 results) No results for input(s): PROBNP in the last 8760 hours.  CBG: Recent Labs  Lab 04/07/18 1143 04/07/18 1730 04/07/18 2125 04/08/18 0749 04/08/18 1152  GLUCAP 192* 151* 142* 154* 180*       Signed:  Desiree Hane, MD [facility] 04/08/2018, 3:13 PM

## 2018-04-07 NOTE — Progress Notes (Addendum)
PROGRESS NOTE  [name]:[id number] DOB: 03-04-1951 DOA: 04/01/2018 PCP: [name], MD  HPI/Brief Narrative  [name] is a 67 y.o. year old female with medical history significant for HTN, type 2 diabetes newly diagnosed A. fib and systolic heart failure who presented on 04/01/2018 with worsening shortness of breath with exertion and lying down over the past 3 weeks with worsening lower extremity swelling not responsive to oral Lasix started by her cardiologist as an outpatient and was found to have A. fib with RVR and acute diastolic CHF exacerbation.  Found to have LV thrombus on TEE on 04/05/2018 so no cardioversion was attempted.   Subjective Complains of some shortness of breath while lying down.  Assessment/Plan:  1. New onset atrial fibrillation with RVR.  Appreciate cardiology recommendations.  Will increase metoprolol to 75 mg twice daily since still with HR in 130s, continue to monitor.  Eliquis 5 mg twice daily for stroke prevention/LA thrombus.  Can repeat TEE/DCCV be as outpatient after 3 to 4 weeks of anticoagulation  2. Acute on chronic diastolic CHF.  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  Briefly diuresis IV Lasix with improvement in volume status.  Net positive last 24 hours.  Monitor after one-time Lasix 40 mg p.o.  3. AKI on CKD.  Peak creatinine of 2.74 most likely related to diuresis.  Much improved now at 1.22.  Continue to monitor on BMP.  Renal ultrasound without evidence of hydronephrosis  4. Hypertension, stable.  Held home amlodipine to lower blood pressure in the setting of above CHF medications.  Home benazepril on hold for AKI.  Consider restarting ACEi if creatinine continues to improve can restart his outpatient.  5. Type 2 diabetes.  Continue Lantus 18 units, sliding scale.  Monitor CBG  6. Nocturnal oxygen desaturation, likely undiagnosed OSA. -We will need sleep study as outpatient  7. Normocytic anemia,  hemoglobin stable at 9.5.  Continue to monitor on CBC.    8. Hyperkalemia, resolved.  Peak potassium 6.2 status post Kayexalate, and other temporizing measures.  9. Depression, stable.  Continue Wellbutrin  10. UTI secondary to Klebsiella pneumonia, resolved.  Received 3 days of Rocephin.  Code Status: Full code  Family Communication: No family at bedside  Disposition Plan: Improvement in rate control, improvement in volume status   Consultants:  Cardiology  Procedures:  TEE, 04/05/2018.  Large thrombus measuring 18 x 12 mm in the left atrial apex  Antimicrobials: Anti-infectives (From admission, onward)   Start     Dose/Rate Route Frequency Ordered Stop   04/04/18 1000  cefTRIAXone (ROCEPHIN) 1 g in sodium chloride 0.9 % 100 mL IVPB  Status:  Discontinued     1 g 200 mL/hr over 30 Minutes Intravenous Every 24 hours 04/04/18 0846 04/06/18 1733         Cultures:  None  Telemetry: Yes  DVT prophylaxis: Eliquis   Objective: Vitals:   04/06/18 2045 04/07/18 0329 04/07/18 1323 04/07/18 1330  BP: 105/63 (!) 130/99  105/64  Pulse: (!) 112 (!) 117 (!) 107 (!) 118  Resp: 19 (!) 21  18  Temp: 97.8 F (36.6 C) 98.1 F (36.7 C)  98.1 F (36.7 C)  TempSrc: Oral Oral  Oral  SpO2: 100% 100%  100%  Weight:  121.6 kg    Height:        Intake/Output Summary (Last 24 hours) at 04/07/2018 1424 Last data filed at 04/07/2018 0930 Gross per 24 hour  Intake 420 ml  Output -  Net 420 ml   Filed Weights   04/05/18 0635 04/06/18 0411 04/07/18 0329  Weight: 124.5 kg 122.4 kg 121.6 kg    Exam:  Constitutional:normal appearing female Eyes: EOMI, anicteric, normal conjunctivae ENMT: Oropharynx with moist mucous membranes, normal dentition Cardiovascular: Irregularly irregular rhythm, tachycardic no MRGs, with no peripheral edema Respiratory: Normal respiratory effort on room air, clear breath sounds  Abdomen: Soft,non-tender,  Skin: No rash ulcers, or lesions. Without  skin tenting  Neurologic: Grossly no focal neuro deficit. Psychiatric:Appropriate affect, and mood. Mental status AAOx3  Data Reviewed: CBC: Recent Labs  Lab 04/03/18 0459 04/04/18 0353 04/05/18 0713 04/06/18 0436 04/07/18 0341  WBC 8.0 8.0 9.1 8.3 7.5  HGB 8.7* 8.5* 8.4* 10.0* 9.5*  HCT 29.2* 28.7* 29.1* 33.8* 33.1*  MCV 89.8 90.0 90.4 90.6 91.4  PLT 300 291 292 350 812   Basic Metabolic Panel: Recent Labs  Lab 04/01/18 2245  04/02/18 0952 04/03/18 0459 04/04/18 0353 04/05/18 0713 04/06/18 0436 04/06/18 0953 04/06/18 1457 04/07/18 0341  NA  --    < >  --  139 140 141 136  --   --  140  K  --    < >  --  4.8 4.5 4.8 6.2* 4.8 4.3 4.2  CL  --    < >  --  104 107 109 107  --   --  108  CO2  --    < >  --  22 22 25  19*  --   --  24  GLUCOSE  --    < >  --  186* 86 101* 254*  --   --  180*  BUN  --    < >  --  30* 37* 37* 37*  --   --  27*  CREATININE  --    < >  --  2.47* 2.74* 2.05* 1.86*  --   --  1.22*  CALCIUM  --    < >  --  8.4* 8.2* 8.4* 8.5*  --   --  8.3*  MG 1.2*  --  2.0 2.1  --   --   --   --   --   --   PHOS  --   --   --   --   --  4.9* 4.3  --   --   --    < > = values in this interval not displayed.   GFR: Estimated Creatinine Clearance: 57.6 mL/min (A) (by C-G formula based on SCr of 1.22 mg/dL (H)). Liver Function Tests: Recent Labs  Lab 04/03/18 0459 04/04/18 0353 04/05/18 0713 04/06/18 0436  AST 13* 11*  --   --   ALT 15 12  --   --   ALKPHOS 68 63  --   --   BILITOT 0.7 0.5  --   --   PROT 5.9* 5.5*  --   --   ALBUMIN 2.7* 2.6* 2.5* 2.9*   No results for input(s): LIPASE, AMYLASE in the last 168 hours. No results for input(s): AMMONIA in the last 168 hours. Coagulation Profile: No results for input(s): INR, PROTIME in the last 168 hours. Cardiac Enzymes: Recent Labs  Lab 04/01/18 2245 04/02/18 0220 04/02/18 0952  TROPONINI <0.03 <0.03 <0.03   BNP (last 3 results) No results for input(s): PROBNP in the last 8760  hours. HbA1C: No results for input(s): HGBA1C in the last 72 hours. CBG: Recent Labs  Lab 04/06/18 1111  04/06/18 1705 04/06/18 2101 04/07/18 0725 04/07/18 1143  GLUCAP 188* 76 134* 189* 192*   Lipid Profile: No results for input(s): CHOL, HDL, LDLCALC, TRIG, CHOLHDL, LDLDIRECT in the last 72 hours. Thyroid Function Tests: No results for input(s): TSH, T4TOTAL, FREET4, T3FREE, THYROIDAB in the last 72 hours. Anemia Panel: No results for input(s): VITAMINB12, FOLATE, FERRITIN, TIBC, IRON, RETICCTPCT in the last 72 hours. Urine analysis:    Component Value Date/Time   COLORURINE AMBER (A) 04/04/2018 0401   APPEARANCEUR CLOUDY (A) 04/04/2018 0401   LABSPEC 1.017 04/04/2018 0401   PHURINE 6.0 04/04/2018 0401   GLUCOSEU NEGATIVE 04/04/2018 0401   HGBUR SMALL (A) 04/04/2018 0401   BILIRUBINUR NEGATIVE 04/04/2018 0401   KETONESUR NEGATIVE 04/04/2018 0401   PROTEINUR 100 (A) 04/04/2018 0401   UROBILINOGEN 0.2 12/01/2007 1025   NITRITE NEGATIVE 04/04/2018 0401   LEUKOCYTESUR LARGE (A) 04/04/2018 0401   Sepsis Labs: @LABRCNTIP (procalcitonin:4,lacticidven:4)  ) Recent Results (from the past 240 hour(s))  MRSA PCR Screening     Status: None   Collection Time: 04/02/18  3:53 AM  Result Value Ref Range Status   MRSA by PCR NEGATIVE NEGATIVE Final    Comment:        The GeneXpert MRSA Assay (FDA approved for NASAL specimens only), is one component of a comprehensive MRSA colonization surveillance program. It is not intended to diagnose MRSA infection nor to guide or monitor treatment for MRSA infections. Performed at [facility], [place] [address] [place], [place] [address]   Culture, Urine     Status: Abnormal   Collection Time: 04/03/18  2:22 PM  Result Value Ref Range Status   Specimen Description URINE, RANDOM  Final   Special Requests   Final    NONE Performed at [facility], [place] [address] [place], [place] [address]    Culture >=100,000 COLONIES/mL  KLEBSIELLA PNEUMONIAE (A)  Final   Report Status 04/06/2018 FINAL  Final   Organism ID, Bacteria KLEBSIELLA PNEUMONIAE (A)  Final      Susceptibility   Klebsiella pneumoniae - MIC*    AMPICILLIN RESISTANT Resistant     CEFAZOLIN <=4 SENSITIVE Sensitive     CEFTRIAXONE <=1 SENSITIVE Sensitive     CIPROFLOXACIN <=0.25 SENSITIVE Sensitive     GENTAMICIN <=1 SENSITIVE Sensitive     IMIPENEM <=0.25 SENSITIVE Sensitive     NITROFURANTOIN <=16 SENSITIVE Sensitive     TRIMETH/SULFA <=20 SENSITIVE Sensitive     AMPICILLIN/SULBACTAM <=2 SENSITIVE Sensitive     PIP/TAZO <=4 SENSITIVE Sensitive     Extended ESBL NEGATIVE Sensitive     * >=100,000 COLONIES/mL KLEBSIELLA PNEUMONIAE      Studies: No results found.  Scheduled Meds: . apixaban  10 mg Oral BID  . [START ON 04/13/2018] apixaban  5 mg Oral BID  . buPROPion  300 mg Oral Daily  . fluticasone  2 spray Each Nare Daily  . furosemide  40 mg Oral Daily  . insulin aspart  0-9 Units Subcutaneous TID WC  . insulin glargine  18 Units Subcutaneous Daily  . loratadine  10 mg Oral Daily  . metoprolol tartrate  75 mg Oral BID  . sodium chloride flush  3 mL Intravenous Q12H  . tiotropium  18 mcg Inhalation Daily    Continuous Infusions: . sodium chloride       LOS: 6 days     Desiree Hane, MD [facility] Pager [phone]  If 7PM-7AM, please contact night-coverage [email or web address] Password [email or web address]  04/07/2018, 2:24 PM

## 2018-04-07 NOTE — Progress Notes (Signed)
PT Cancellation Note  Patient Details Name: [name] MRN: [id number] DOB: 1950/09/15   Cancelled Treatment:    Reason Eval/Treat Not Completed: Other (comment).  Pt is adamant about refusing therapy, stating she has already done therapy and is in a WC at home and does not need it.  States she and her husband are managing fine.   Asked her if PT can visit again tomorrow just to be sure she has not changed her mind, and pt agreed.   Ramond Dial 04/07/2018, 7:35 AM   Mee Hives, PT MS Acute Rehab Dept. Number: [place]

## 2018-04-07 NOTE — Telephone Encounter (Signed)
Currently admitted.

## 2018-04-07 NOTE — Care Management Note (Signed)
Case Management Note  Patient Details  Name: [name] MRN: [id number] Date of Birth: [birth year]  Subjective/Objective: Pt presented for Acute CHF and new onset atrial fib. PTA from home with support of husband. Pt is mostly wheelchair bound- still drives. Patient states she has DME WC, 2 RW's, Toilet Riser, [place], lift chair and ramp to back porch. When discussing with patient she had a fall a year ago and feels scared to walk by herself. Patient makes sure she has the support of RW when she gets out of Wheelchair to ambulate. Patient states she is not at baseline feels deconditioned and will need HH PT- has used [facility] in the past and wants to utilize again. Pt has PCP [name], MD and can get to appointments. No problems getting medications and being compliant with them.                 Action/Plan: CM did make referral to [place] with [facility] for [facility] PT declined [place] at this time. Pt will need HH PT order and F2F once stable to transition home. Patient states that husband will be able to transport home once stable. No further needs from CM at this time.    Expected Discharge Date:                  Expected Discharge Plan:  [place]  In-House Referral:  NA  Discharge planning Services  CM Consult, Medication Assistance  Post Acute Care Choice:  Home Health Choice offered to:  Patient  DME Arranged:  N/A DME Agency:  NA  HH Arranged:  PT HH Agency:  [facility]  Status of Service:     If discussed at [place] of Stay Meetings, dates discussed:    Additional Comments:  Bethena Roys, RN 04/07/2018, 4:05 PM

## 2018-04-07 NOTE — Progress Notes (Signed)
Progress Note  Patient Name: [name] Date of Encounter: 04/07/2018  Primary Cardiologist: No primary care provider on file.   Subjective   Asked to see again by primary team for Afib RVR.  She continues to make small improvements but overall feels progress is slow. Breathing is certainly better than admission. She reports still having some chest pressure and SOB.   Inpatient Medications    Scheduled Meds: . apixaban  10 mg Oral BID  . [START ON 04/13/2018] apixaban  5 mg Oral BID  . buPROPion  300 mg Oral Daily  . fluticasone  2 spray Each Nare Daily  . insulin aspart  0-9 Units Subcutaneous TID WC  . insulin glargine  18 Units Subcutaneous Daily  . loratadine  10 mg Oral Daily  . metoprolol tartrate  50 mg Oral Once  . metoprolol tartrate  75 mg Oral BID  . sodium chloride flush  3 mL Intravenous Q12H  . tiotropium  18 mcg Inhalation Daily   Continuous Infusions: . sodium chloride     PRN Meds: acetaminophen **OR** [DISCONTINUED] acetaminophen, guaiFENesin, ondansetron **OR** ondansetron (ZOFRAN) IV, sodium chloride flush   Vital Signs    Vitals:   04/06/18 0738 04/06/18 0807 04/06/18 2045 04/07/18 0329  BP: 130/77  105/63 (!) 130/99  Pulse: (!) 104  (!) 112 (!) 117  Resp: 19  19 (!) 21  Temp:   97.8 F (36.6 C) 98.1 F (36.7 C)  TempSrc:   Oral Oral  SpO2: 100% 100% 100% 100%  Weight:    121.6 kg  Height:        Intake/Output Summary (Last 24 hours) at 04/07/2018 1242 Last data filed at 04/07/2018 0930 Gross per 24 hour  Intake 660 ml  Output -  Net 660 ml   Filed Weights   04/05/18 0635 04/06/18 0411 04/07/18 0329  Weight: 124.5 kg 122.4 kg 121.6 kg    Telemetry    Atrial fibrillation with rate persistently >110s over the past 24 hours - Personally Reviewed  Physical Exam   GEN: Morbidly obese female laying in bed in no acute distress.   Neck: No JVD, no carotid bruits Cardiac: IRIR, no murmurs, rubs, or gallops.  Respiratory: Clear to  auscultation bilaterally anteriorly, no wheezes/ rales/ rhonchi GI: NABS, Soft, obese, nontender, non-distended  MS: No edema; No deformity. Neuro:  Nonfocal, moving all extremities spontaneously Psych: Normal affect   Labs    Chemistry Recent Labs  Lab 04/03/18 0459 04/04/18 0353 04/05/18 0713 04/06/18 0436 04/06/18 0953 04/06/18 1457 04/07/18 0341  NA 139 140 141 136  --   --  140  K 4.8 4.5 4.8 6.2* 4.8 4.3 4.2  CL 104 107 109 107  --   --  108  CO2 22 22 25  19*  --   --  24  GLUCOSE 186* 86 101* 254*  --   --  180*  BUN 30* 37* 37* 37*  --   --  27*  CREATININE 2.47* 2.74* 2.05* 1.86*  --   --  1.22*  CALCIUM 8.4* 8.2* 8.4* 8.5*  --   --  8.3*  PROT 5.9* 5.5*  --   --   --   --   --   ALBUMIN 2.7* 2.6* 2.5* 2.9*  --   --   --   AST 13* 11*  --   --   --   --   --   ALT 15 12  --   --   --   --   --  ALKPHOS 68 63  --   --   --   --   --   BILITOT 0.7 0.5  --   --   --   --   --   GFRNONAA 19* 17* 24* 27*  --   --  45*  GFRAA 22* 20* 28* 31*  --   --  52*  ANIONGAP 13 11 7 10   --   --  8     Hematology Recent Labs  Lab 04/05/18 0713 04/06/18 0436 04/07/18 0341  WBC 9.1 8.3 7.5  RBC 3.22* 3.73* 3.62*  HGB 8.4* 10.0* 9.5*  HCT 29.1* 33.8* 33.1*  MCV 90.4 90.6 91.4  MCH 26.1 26.8 26.2  MCHC 28.9* 29.6* 28.7*  RDW 15.5 15.8* 15.6*  PLT 292 350 319    Cardiac Enzymes Recent Labs  Lab 04/01/18 2245 04/02/18 0220 04/02/18 0952  TROPONINI <0.03 <0.03 <0.03    Recent Labs  Lab 04/01/18 1708  TROPIPOC 0.02     BNPNo results for input(s): BNP, PROBNP in the last 168 hours.   DDimer No results for input(s): DDIMER in the last 168 hours.   Radiology    No results found.  Cardiac Studies   Echocardiogram 04/02/18: Left ventricle: The cavity size was normal. Wall thickness was increased in a pattern of mild LVH. Systolic function was normal.The estimated ejection fraction was in the range of 55% to 60%.Although no diagnostic regional wall motion  abnormality wasidentified, this possibility cannot be completely excluded on thebasis of this study. The study was not technically sufficient toallow evaluation of LV diastolic dysfunction due to atrialfibrillation. - Aortic valve: There was no stenosis. - Mitral valve: Moderately to severely calcified annulus. Mildly calcified leaflets . Elevated mean gradient across mitral but valve area by pressure half-time was normal. Probably no significant mitral stenosis. There was trivial regurgitation. Mean gradient (D): 5 mm Hg. Valve area by pressure half-time:2.58 cm^2. - Left atrium: The atrium was mildly dilated. - Right ventricle: The cavity size was normal. Systolic function was mildly reduced. - Right atrium: The atrium was mildly dilated. - Tricuspid valve: There was moderate regurgitation. Peak RV-RAgradient (S): 31 mm Hg. - Pulmonary arteries: PA peak pressure: 46 mm Hg (S). - Systemic veins: IVC measured 2.5 cm with < 50% respirophasicvariation, suggesting RA pressure 15 mmHg.  Impressions:  - The patient was in atrial fibrillation. Normal LV size with mild LV hypertrophy. EF 55-60%. Normal RV size with mildly decreasedsystolic function. Moderate pulmonary hypertension. Calcifiedmitral valve and annulus, elevated mean gradient but normalpressure half-time, probably no significant stenosis. Biatrialenlargement. Dilated IVC suggestive of elevated RV fillingpressure.  TEE 04/05/18 Study Conclusions  - Left ventricle: Systolic function was normal. The estimated   ejection fraction was in the range of 55% to 60%. Wall motion was   normal; there were no regional wall motion abnormalities. - Mitral valve: There was mild regurgitation. - Left atrium: The atrium was moderately dilated. No evidence of   thrombus in the atrial cavity or appendage. No evidence of   thrombus in the atrial cavity or appendage. - Right ventricle: The cavity size was mildly dilated.  Wall   thickness was normal. Systolic function was mildly reduced. - Right atrium: The atrium was moderately dilated. No evidence of   thrombus in the atrial cavity or appendage. - Tricuspid valve: There was moderate-severe regurgitation. - Pulmonary arteries: Systolic pressure was moderately increased.   PA peak pressure: 47 mm Hg (S).  Impressions:  - A large  thrombus measuring 18 x 12 mm is seen in the left atrial   apex. The cardioversion was not performed.   Patient Profile     67 y.o.femalewith three week history of DOE, orthopnea, weight gain, increasing abdominal girth but no palpitations or chest pain who presented to ER with progressive symptoms and was found to be in afib RVR.   Assessment & Plan    1. New onset atrial fibrillation with RVR: Underwent TEE 04/05/18 and unfortunately was found to have a large LV thrombus and DCCV was aborted. She has continued to have Afib RVR with rate 100s-120s.  - Will increase metoprolol to 63m BID - Continue apixaban 555mBID for stroke prevention and LV thrombus - can consider repeat TEE/DCCV after 3-4 weeks of anticoagulation.  2. Acute diastolic CHF: Echo this admission with EF 55-60%. Suspect this is driven by #1. She was diuresed with IV lasix and volume status improved. Maintaining euvolemic status without diuretics. She is net +1L in the past 24 hours. - Continue metoprolol - Will give one time dose of po lasix 401moday and monitor for response.  3. Acute on chronic renal insufficiency: Cr peaked at 2.74 likely 2/2 diuresis; improved to 1.22 today. - Continue to monitor Cr closely  4. HTN: BP stable. Home amlodipine discontinued to allow room for CHF medications. Home benzapril on hold for AKI. - Will increase metoprolol to 74m48mD - Could consider restarting ACEi/ARB outpatient if Cr continues to improve.   5. DM type 2: - Continue management per primary team  6. Nocturnal desaturation: noted on monitor throughout  admission. Suspect undiagnosed OSA.  - Would benefit from a sleep study outpatient  7. Normocytic anemia: Hgb stable at 9.5 today - Continue management per primary team  Encourage out of bed to chair/ambulation today.    For questions or updates, please contact [facility] consult [email or web address] for contact info under Cardiology/STEMI.      Signed, KrisAbigail Butts-C  04/07/2018, 12:42 PM   [phone]

## 2018-04-08 DIAGNOSIS — E875 Hyperkalemia: Secondary | ICD-10-CM

## 2018-04-08 LAB — CBC
HCT: 30.4 % — ABNORMAL LOW (ref 36.0–46.0)
Hemoglobin: 8.8 g/dL — ABNORMAL LOW (ref 12.0–15.0)
MCH: 26.5 pg (ref 26.0–34.0)
MCHC: 28.9 g/dL — ABNORMAL LOW (ref 30.0–36.0)
MCV: 91.6 fL (ref 78.0–100.0)
Platelets: 283 10*3/uL (ref 150–400)
RBC: 3.32 MIL/uL — ABNORMAL LOW (ref 3.87–5.11)
RDW: 15.3 % (ref 11.5–15.5)
WBC: 6.8 10*3/uL (ref 4.0–10.5)

## 2018-04-08 LAB — BASIC METABOLIC PANEL
ANION GAP: 8 (ref 5–15)
BUN: 24 mg/dL — ABNORMAL HIGH (ref 8–23)
CO2: 25 mmol/L (ref 22–32)
Calcium: 8.7 mg/dL — ABNORMAL LOW (ref 8.9–10.3)
Chloride: 109 mmol/L (ref 98–111)
Creatinine, Ser: 1.16 mg/dL — ABNORMAL HIGH (ref 0.44–1.00)
GFR calc non Af Amer: 48 mL/min — ABNORMAL LOW (ref >60)
GFR, EST AFRICAN AMERICAN: 55 mL/min — AB (ref >60)
GLUCOSE: 182 mg/dL — AB (ref 70–99)
POTASSIUM: 4.2 mmol/L (ref 3.5–5.1)
Sodium: 142 mmol/L (ref 135–145)

## 2018-04-08 LAB — GLUCOSE, CAPILLARY
Glucose-Capillary: 154 mg/dL — ABNORMAL HIGH (ref 70–99)
Glucose-Capillary: 180 mg/dL — ABNORMAL HIGH (ref 70–99)

## 2018-04-08 MED ORDER — METOPROLOL TARTRATE 75 MG PO TABS: 75.0000 mg | ORAL_TABLET | Freq: Two times a day (BID) | ORAL | 0 refills | Status: DC

## 2018-04-08 MED ORDER — FUROSEMIDE 40 MG PO TABS: 40.0000 mg | ORAL_TABLET | Freq: Every day | ORAL | 0 refills | Status: DC

## 2018-04-08 MED ORDER — APIXABAN 5 MG PO TABS: 10.0000 mg | ORAL_TABLET | Freq: Two times a day (BID) | ORAL | 0 refills | Status: DC

## 2018-04-08 MED ORDER — APIXABAN 5 MG PO TABS: 5.0000 mg | ORAL_TABLET | Freq: Two times a day (BID) | ORAL | 0 refills | Status: DC

## 2018-04-08 NOTE — Telephone Encounter (Signed)
Still admitted

## 2018-04-08 NOTE — Progress Notes (Signed)
Progress Note  Patient Name: [name] Date of Encounter: 04/08/2018  Primary Cardiologist: Buford Dresser, MD   Subjective   HR better controlled on increased metoprolol. We again discussed the balance of HR control and fatigue/mood issues from beta blockers. She worked with PT doing transfers, again stressed the importance of doing activity. She didn't sleep well. She feels her breathing is OK in the chair, but it is more labored when she is lying in bed. She wants to go home.  Inpatient Medications    Scheduled Meds: . apixaban  10 mg Oral BID  . [START ON 04/13/2018] apixaban  5 mg Oral BID  . buPROPion  300 mg Oral Daily  . fluticasone  2 spray Each Nare Daily  . furosemide  40 mg Oral Daily  . insulin aspart  0-9 Units Subcutaneous TID WC  . insulin glargine  18 Units Subcutaneous Daily  . loratadine  10 mg Oral Daily  . metoprolol tartrate  75 mg Oral BID  . sodium chloride flush  3 mL Intravenous Q12H  . tiotropium  18 mcg Inhalation Daily   Continuous Infusions: . sodium chloride     PRN Meds: acetaminophen **OR** [DISCONTINUED] acetaminophen, guaiFENesin, ondansetron **OR** ondansetron (ZOFRAN) IV, sodium chloride flush   Vital Signs    Vitals:   04/07/18 1330 04/07/18 2042 04/08/18 0020 04/08/18 0430  BP: 105/64 130/71  122/77  Pulse: (!) 118 (!) 115  97  Resp: 18 14 16 20   Temp: 98.1 F (36.7 C) (!) 97.5 F (36.4 C)  (!) 97.5 F (36.4 C)  TempSrc: Oral Oral  Oral  SpO2: 100% 95% 97% 97%  Weight:    120.9 kg  Height:        Intake/Output Summary (Last 24 hours) at 04/08/2018 1043 Last data filed at 04/07/2018 2130 Gross per 24 hour  Intake 240 ml  Output -  Net 240 ml   Filed Weights   04/06/18 0411 04/07/18 0329 04/08/18 0430  Weight: 122.4 kg 121.6 kg 120.9 kg    Telemetry    Atrial fibrillation with rate averaged around 100 over the past 24 hours - Personally Reviewed  Physical Exam   GEN: Morbidly obese female laying in  bed in no acute distress.   Neck: No JVD, no carotid bruits Cardiac: IRIR, no murmurs, rubs, or gallops.  Respiratory: Clear to auscultation bilaterally with faint crackles at left base. GI: NABS, Soft, obese, nontender, non-distended  MS: No edema; No deformity. Neuro:  Nonfocal, moving all extremities spontaneously Psych: Normal affect   Labs    Chemistry Recent Labs  Lab 04/03/18 0459 04/04/18 0353 04/05/18 0713 04/06/18 0436  04/06/18 1457 04/07/18 0341 04/08/18 0903  NA 139 140 141 136  --   --  140 142  K 4.8 4.5 4.8 6.2*   < > 4.3 4.2 4.2  CL 104 107 109 107  --   --  108 109  CO2 22 22 25  19*  --   --  24 25  GLUCOSE 186* 86 101* 254*  --   --  180* 182*  BUN 30* 37* 37* 37*  --   --  27* 24*  CREATININE 2.47* 2.74* 2.05* 1.86*  --   --  1.22* 1.16*  CALCIUM 8.4* 8.2* 8.4* 8.5*  --   --  8.3* 8.7*  PROT 5.9* 5.5*  --   --   --   --   --   --   ALBUMIN 2.7* 2.6*  2.5* 2.9*  --   --   --   --   AST 13* 11*  --   --   --   --   --   --   ALT 15 12  --   --   --   --   --   --   ALKPHOS 68 63  --   --   --   --   --   --   BILITOT 0.7 0.5  --   --   --   --   --   --   GFRNONAA 19* 17* 24* 27*  --   --  45* 48*  GFRAA 22* 20* 28* 31*  --   --  52* 55*  ANIONGAP 13 11 7 10   --   --  8 8   < > = values in this interval not displayed.     Hematology Recent Labs  Lab 04/06/18 0436 04/07/18 0341 04/08/18 0415  WBC 8.3 7.5 6.8  RBC 3.73* 3.62* 3.32*  HGB 10.0* 9.5* 8.8*  HCT 33.8* 33.1* 30.4*  MCV 90.6 91.4 91.6  MCH 26.8 26.2 26.5  MCHC 29.6* 28.7* 28.9*  RDW 15.8* 15.6* 15.3  PLT 350 319 283    Cardiac Enzymes Recent Labs  Lab 04/01/18 2245 04/02/18 0220 04/02/18 0952  TROPONINI <0.03 <0.03 <0.03    Recent Labs  Lab 04/01/18 1708  TROPIPOC 0.02     BNPNo results for input(s): BNP, PROBNP in the last 168 hours.   DDimer No results for input(s): DDIMER in the last 168 hours.   Radiology    No results found.  Cardiac Studies    Echocardiogram 04/02/18: Left ventricle: The cavity size was normal. Wall thickness was increased in a pattern of mild LVH. Systolic function was normal.The estimated ejection fraction was in the range of 55% to 60%.Although no diagnostic regional wall motion abnormality wasidentified, this possibility cannot be completely excluded on thebasis of this study. The study was not technically sufficient toallow evaluation of LV diastolic dysfunction due to atrialfibrillation. - Aortic valve: There was no stenosis. - Mitral valve: Moderately to severely calcified annulus. Mildly calcified leaflets . Elevated mean gradient across mitral but valve area by pressure half-time was normal. Probably no significant mitral stenosis. There was trivial regurgitation. Mean gradient (D): 5 mm Hg. Valve area by pressure half-time:2.58 cm^2. - Left atrium: The atrium was mildly dilated. - Right ventricle: The cavity size was normal. Systolic function was mildly reduced. - Right atrium: The atrium was mildly dilated. - Tricuspid valve: There was moderate regurgitation. Peak RV-RAgradient (S): 31 mm Hg. - Pulmonary arteries: PA peak pressure: 46 mm Hg (S). - Systemic veins: IVC measured 2.5 cm with < 50% respirophasicvariation, suggesting RA pressure 15 mmHg.  Impressions:  - The patient was in atrial fibrillation. Normal LV size with mild LV hypertrophy. EF 55-60%. Normal RV size with mildly decreasedsystolic function. Moderate pulmonary hypertension. Calcifiedmitral valve and annulus, elevated mean gradient but normalpressure half-time, probably no significant stenosis. Biatrialenlargement. Dilated IVC suggestive of elevated RV fillingpressure.  TEE 04/05/18 Study Conclusions  - Left ventricle: Systolic function was normal. The estimated   ejection fraction was in the range of 55% to 60%. Wall motion was   normal; there were no regional wall motion abnormalities. - Mitral  valve: There was mild regurgitation. - Left atrium: The atrium was moderately dilated. No evidence of   thrombus in the atrial cavity or appendage. No evidence of  thrombus in the atrial cavity or appendage. - Right ventricle: The cavity size was mildly dilated. Wall   thickness was normal. Systolic function was mildly reduced. - Right atrium: The atrium was moderately dilated. No evidence of   thrombus in the atrial cavity or appendage. - Tricuspid valve: There was moderate-severe regurgitation. - Pulmonary arteries: Systolic pressure was moderately increased.   PA peak pressure: 47 mm Hg (S).  Impressions:  - A large thrombus measuring 18 x 12 mm is seen in the left atrial   apex. The cardioversion was not performed.   Patient Profile     67 y.o.femalewith three week history of DOE, orthopnea, weight gain, increasing abdominal girth but no palpitations or chest pain who presented to ER with progressive symptoms and was found to be in afib RVR.   Assessment & Plan    1. New onset atrial fibrillation with RVR: Underwent TEE 04/05/18 and unfortunately was found to have a large LV thrombus and DCCV was aborted. She has continued to have Afib RVR with rate 100s-120s.  - continue metoprolol 41m BID - Continue apixaban at increased dose of 10 mg BID for a total of 7 days, then 567mBID starting 8/27 for stroke prevention and LV thrombus - can consider repeat TEE/DCCV after 3-4 weeks of anticoagulation.  2. Acute diastolic CHF: Echo this admission with EF 55-60%. Suspect this is driven by #1.  - Continue metoprolol - ins/outs don't appear accurate, weight has fluctuated and down slightly from admission. -will send home on daily furosemide, follow up at visit  3. Acute on chronic renal insufficiency: Cr peaked at 2.74 likely 2/2 diuresis; improved to 1.16 today. - Continue to monitor Cr closely  4. HTN: BP stable. Home amlodipine discontinued to allow room for CHF medications. Home  benzapril on hold for AKI. - continue metoprolol 7542mID - Could consider restarting ACEi/ARB outpatient if Cr continues to improve.   5. DM type 2: - Continue management per primary team  6. Nocturnal desaturation: noted on monitor throughout admission. Suspect undiagnosed OSA.  - Would benefit from a sleep study outpatient  7. Normocytic anemia:  - Continue management per primary team  Encourage out of bed to chair/ambulation today.    For questions or updates, please contact [facility] consult [email or web address] for contact info under Cardiology/STEMI.   [facility] will sign off in anticipation of discharge.   Medication Recommendations:  As above Other recommendations (labs, testing, etc):  bmet next week Follow up as an outpatient:  Has appt with me scheduled    Signed, BriBuford DresserD  04/08/2018, 10:43 AM

## 2018-04-08 NOTE — Care Management Note (Signed)
Case Management Note  Patient Details  Name: [name] MRN: [id number] Date of Birth: 30-Aug-1950  Subjective/Objective: Pt presented for Acute CHF and new onset atrial fib. PTA from home with support of husband. Pt is mostly wheelchair bound- still drives. Patient states she has DME WC, 2 RW's, Toilet Riser, [place], lift chair and ramp to back porch. When discussing with patient she had a fall a year ago and feels scared to walk by herself. Patient makes sure she has the support of RW when she gets out of Wheelchair to ambulate. Patient states she is not at baseline feels deconditioned and will need HH PT- has used [facility] in the past and wants to utilize again. Pt has PCP [name], MD and can get to appointments. No problems getting medications and being compliant with them.                 Action/Plan: CM did make referral to [place] with [facility] for [facility] PT declined [place] at this time. Pt will need HH PT order and F2F once stable to transition home. Patient states that husband will be able to transport home once stable. No further needs from CM at this time.    Expected Discharge Date:                  Expected Discharge Plan:  [place]  In-House Referral:  NA  Discharge planning Services  CM Consult, Medication Assistance  Post Acute Care Choice:  Home Health Choice offered to:  Patient  DME Arranged:  N/A DME Agency:  NA  HH Arranged:  PT HH Agency:  [place]  Status of Service:     If discussed at [place] of Stay Meetings, dates discussed:    Additional Comments: 04/08/18 @ 1411-Claryce Friel RNCM- As per previous note, patient will transition home with spouse with HHPT services with [facility]. Eliquis benefits check completed, with zero copay, with patient/spouse updated. Eliquis 30-day free card was provided to patient. Patient reported [facility] as her local pharmacy, with [facility] contacted and confirmed Rx was available.  Patient informed CM of tentative transition home later today, with [facility] liaison Dan updated. Patient's spouse at bedside for transportation home. CM will sign off.   Midge Minium RN, BSN, NCM-BC, ACM-RN [phone] 04/08/2018, 2:10 PM

## 2018-04-08 NOTE — Evaluation (Signed)
Physical Therapy Evaluation Patient Details Name: [name] MRN: [id number] DOB: Jul 27, 1951 Today's Date: 04/08/2018   History of Present Illness  67yo female with c/o SOB when laying down over the past 3 weeks and increased B LE edema. FOund to be in A-fib with RVR in the ED, and found to have LV thrombus on 8/19, eliquis initiated. PMH DM, HTN, femoral IM nail L, hx wrist fracture surgery   Clinical Impression   Patient received sitting up at EOB, reporting fatigue due to not sleeping well but willing to participate in PT session today. She reports that she is able to walk in and out of her house with standard walker, but has fallen multiple times and has a large fear of falling due to this. She is able to complete functional sit to stand and take pivotal steps to recliner with RW and general S for safety, no physical assistance given; did not progress mobility further today due to elevated fluctuating HR (between 97-136BPM during this session). She was left up in the chair with all needs met this morning. She will continue to benefit from skilled PT services in the acute setting as well as skilled HHPT services to further address functional deficits moving forward.     Follow Up Recommendations Home health PT    Equipment Recommendations  None recommended by PT    Recommendations for Other Services       Precautions / Restrictions Precautions Precautions: Fall;Other (comment) Precaution Comments: watch HR  Restrictions Weight Bearing Restrictions: No      Mobility  Bed Mobility               General bed mobility comments: DNT received up at EOB   Transfers Overall transfer level: Needs assistance Equipment used: Rolling walker (2 wheeled) Transfers: Sit to/from Stand Sit to Stand: Supervision         General transfer comment: no physical assist provided, S for safety only   Ambulation/Gait             General Gait Details: did not progress gait due to  HR (fluctuating between 97-136 with transfer to chair), but able to take 3-4 steps to chair with RW and S for safety   Stairs            Wheelchair Mobility    Modified Rankin (Stroke Patients Only)       Balance Overall balance assessment: History of Falls;Mild deficits observed, not formally tested                                           Pertinent Vitals/Pain Pain Assessment: No/denies pain    Home Living Family/patient expects to be discharged to:: Private residence Living Arrangements: Spouse/significant other(grand-daughter ) Available Help at Discharge: Family;Available PRN/intermittently Type of Home: Mobile home Home Access: Ramped entrance     Home Layout: One level Home Equipment: Walker - standard;Bedside commode;Grab bars - tub/shower;Shower seat;Wheelchair - manual;Tub bench      Prior Function Level of Independence: Independent with assistive device(s)         Comments: but with frequent falls      Hand Dominance        Extremity/Trunk Assessment   Upper Extremity Assessment Upper Extremity Assessment: Defer to OT evaluation    Lower Extremity Assessment Lower Extremity Assessment: Generalized weakness    Cervical / Trunk Assessment Cervical /  Trunk Assessment: Kyphotic  Communication   Communication: No difficulties  Cognition Arousal/Alertness: Awake/alert Behavior During Therapy: WFL for tasks assessed/performed Overall Cognitive Status: Within Functional Limits for tasks assessed                                        General Comments General comments (skin integrity, edema, etc.): HR continues to fluctuate 97-121 at rest; with transfer to chair HR up to as much as 136BPM. Did not progress mobility due to safety/HR concerns today.     Exercises     Assessment/Plan    PT Assessment Patient needs continued PT services  PT Problem List Decreased strength;Decreased mobility;Decreased  coordination;Obesity;Decreased activity tolerance;Cardiopulmonary status limiting activity;Decreased balance       PT Treatment Interventions DME instruction;Therapeutic activities;Gait training;Therapeutic exercise;Patient/family education;Stair training;Balance training;Functional mobility training;Neuromuscular re-education    PT Goals (Current goals can be found in the Care Plan section)  Acute Rehab PT Goals Patient Stated Goal: to go home and not fall anymore  PT Goal Formulation: With patient Time For Goal Achievement: 04/22/18 Potential to Achieve Goals: Good    Frequency Min 3X/week   Barriers to discharge        Co-evaluation               AM-PAC PT "6 Clicks" Daily Activity  Outcome Measure Difficulty turning over in bed (including adjusting bedclothes, sheets and blankets)?: A Little Difficulty moving from lying on back to sitting on the side of the bed? : A Little Difficulty sitting down on and standing up from a chair with arms (e.g., wheelchair, bedside commode, etc,.)?: A Little Help needed moving to and from a bed to chair (including a wheelchair)?: A Little Help needed walking in hospital room?: A Little Help needed climbing 3-5 steps with a railing? : A Lot 6 Click Score: 17    End of Session Equipment Utilized During Treatment: Gait belt Activity Tolerance: Patient tolerated treatment well Patient left: in chair;with call bell/phone within reach   PT Visit Diagnosis: Muscle weakness (generalized) (M62.81);History of falling (Z91.81)    Time: 9326-7124 PT Time Calculation (min) (ACUTE ONLY): 15 min   Charges:   PT Evaluation $PT Eval Moderate Complexity: 1 Mod          Deniece Ree PT, DPT, CBIS  Supplemental Physical Therapist [place]   Pager [phone]

## 2018-04-08 NOTE — Care Management (Signed)
04-08-18  BENEFITS CHECK :  #  5.  S/W DAME @ [facility] # [id number]  1. ELIQUIS  2.5 MG BID COVER- YES CO-PAY- ZERO DOLLARS TIER- NO PRIOR APPROVAL- NO  2. ELIQUIS  5 MG BID  COVER- YES CO-PAY- ZERO DOLLARS TIER- NO PRIOR APPROVAL- NO  PREFERRED PHARMACY : [facility]   [facility]

## 2018-04-09 NOTE — Telephone Encounter (Signed)
Patient contacted regarding discharge from [facility] on 04/08/18.  Patient understands to follow up with provider Dr. Harrell Gave on 04/23/18 at 840am  at [facility] . Patient understands discharge instructions? yes Patient understands medications and regiment? yes Patient understands to bring all medications to this visit? yes  Pt states she will need refills on some medication but have enough to last until appointment. Advised that refills will be sent at the completion of appointment. Verbalized understanding.

## 2018-04-10 ENCOUNTER — Emergency Department (HOSPITAL_COMMUNITY): Payer: BLUE CROSS/BLUE SHIELD

## 2018-04-10 ENCOUNTER — Other Ambulatory Visit: Payer: Self-pay

## 2018-04-10 ENCOUNTER — Inpatient Hospital Stay (HOSPITAL_COMMUNITY)
Admission: EM | Admit: 2018-04-10 | Discharge: 2018-04-13 | DRG: 291 | Disposition: A | Discharge status: Home Health | Payer: BLUE CROSS/BLUE SHIELD | Attending: Internal Medicine | Admitting: Internal Medicine

## 2018-04-10 ENCOUNTER — Encounter (HOSPITAL_COMMUNITY): Payer: Self-pay | Admitting: Emergency Medicine

## 2018-04-10 DIAGNOSIS — I130 Hypertensive heart and chronic kidney disease with heart failure and stage 1 through stage 4 chronic kidney disease, or unspecified chronic kidney disease: Secondary | ICD-10-CM | POA: Diagnosis not present

## 2018-04-10 DIAGNOSIS — Z7951 Long term (current) use of inhaled steroids: Secondary | ICD-10-CM

## 2018-04-10 DIAGNOSIS — R5381 Other malaise: Secondary | ICD-10-CM | POA: Diagnosis present

## 2018-04-10 DIAGNOSIS — S79912A Unspecified injury of left hip, initial encounter: Secondary | ICD-10-CM | POA: Diagnosis not present

## 2018-04-10 DIAGNOSIS — I5043 Acute on chronic combined systolic (congestive) and diastolic (congestive) heart failure: Secondary | ICD-10-CM | POA: Diagnosis present

## 2018-04-10 DIAGNOSIS — Z90710 Acquired absence of both cervix and uterus: Secondary | ICD-10-CM

## 2018-04-10 DIAGNOSIS — I513 Intracardiac thrombosis, not elsewhere classified: Secondary | ICD-10-CM | POA: Diagnosis not present

## 2018-04-10 DIAGNOSIS — I959 Hypotension, unspecified: Secondary | ICD-10-CM | POA: Diagnosis not present

## 2018-04-10 DIAGNOSIS — S82102A Unspecified fracture of upper end of left tibia, initial encounter for closed fracture: Secondary | ICD-10-CM | POA: Diagnosis not present

## 2018-04-10 DIAGNOSIS — I236 Thrombosis of atrium, auricular appendage, and ventricle as current complications following acute myocardial infarction: Secondary | ICD-10-CM | POA: Diagnosis not present

## 2018-04-10 DIAGNOSIS — R079 Chest pain, unspecified: Secondary | ICD-10-CM | POA: Diagnosis not present

## 2018-04-10 DIAGNOSIS — F339 Major depressive disorder, recurrent, unspecified: Secondary | ICD-10-CM | POA: Diagnosis not present

## 2018-04-10 DIAGNOSIS — M810 Age-related osteoporosis without current pathological fracture: Secondary | ICD-10-CM | POA: Diagnosis not present

## 2018-04-10 DIAGNOSIS — E119 Type 2 diabetes mellitus without complications: Secondary | ICD-10-CM

## 2018-04-10 DIAGNOSIS — M79604 Pain in right leg: Secondary | ICD-10-CM | POA: Diagnosis not present

## 2018-04-10 DIAGNOSIS — S8254XA Nondisplaced fracture of medial malleolus of right tibia, initial encounter for closed fracture: Secondary | ICD-10-CM | POA: Diagnosis not present

## 2018-04-10 DIAGNOSIS — I5023 Acute on chronic systolic (congestive) heart failure: Secondary | ICD-10-CM | POA: Diagnosis not present

## 2018-04-10 DIAGNOSIS — S82232D Displaced oblique fracture of shaft of left tibia, subsequent encounter for closed fracture with routine healing: Secondary | ICD-10-CM | POA: Diagnosis not present

## 2018-04-10 DIAGNOSIS — R293 Abnormal posture: Secondary | ICD-10-CM | POA: Diagnosis not present

## 2018-04-10 DIAGNOSIS — Z833 Family history of diabetes mellitus: Secondary | ICD-10-CM

## 2018-04-10 DIAGNOSIS — Z79899 Other long term (current) drug therapy: Secondary | ICD-10-CM | POA: Diagnosis not present

## 2018-04-10 DIAGNOSIS — R0601 Orthopnea: Secondary | ICD-10-CM | POA: Diagnosis present

## 2018-04-10 DIAGNOSIS — S82152A Displaced fracture of left tibial tuberosity, initial encounter for closed fracture: Secondary | ICD-10-CM | POA: Diagnosis not present

## 2018-04-10 DIAGNOSIS — R000 Tachycardia, unspecified: Secondary | ICD-10-CM | POA: Diagnosis not present

## 2018-04-10 DIAGNOSIS — Z7984 Long term (current) use of oral hypoglycemic drugs: Secondary | ICD-10-CM

## 2018-04-10 DIAGNOSIS — Z9111 Patient's noncompliance with dietary regimen: Secondary | ICD-10-CM | POA: Diagnosis not present

## 2018-04-10 DIAGNOSIS — S82839A Other fracture of upper and lower end of unspecified fibula, initial encounter for closed fracture: Secondary | ICD-10-CM | POA: Diagnosis not present

## 2018-04-10 DIAGNOSIS — D631 Anemia in chronic kidney disease: Secondary | ICD-10-CM | POA: Diagnosis not present

## 2018-04-10 DIAGNOSIS — E1165 Type 2 diabetes mellitus with hyperglycemia: Secondary | ICD-10-CM | POA: Diagnosis not present

## 2018-04-10 DIAGNOSIS — F329 Major depressive disorder, single episode, unspecified: Secondary | ICD-10-CM | POA: Diagnosis not present

## 2018-04-10 DIAGNOSIS — S82831A Other fracture of upper and lower end of right fibula, initial encounter for closed fracture: Secondary | ICD-10-CM | POA: Diagnosis not present

## 2018-04-10 DIAGNOSIS — I110 Hypertensive heart disease with heart failure: Secondary | ICD-10-CM | POA: Diagnosis not present

## 2018-04-10 DIAGNOSIS — Z91040 Latex allergy status: Secondary | ICD-10-CM

## 2018-04-10 DIAGNOSIS — R002 Palpitations: Secondary | ICD-10-CM | POA: Diagnosis not present

## 2018-04-10 DIAGNOSIS — R278 Other lack of coordination: Secondary | ICD-10-CM | POA: Diagnosis not present

## 2018-04-10 DIAGNOSIS — Z7901 Long term (current) use of anticoagulants: Secondary | ICD-10-CM

## 2018-04-10 DIAGNOSIS — I4891 Unspecified atrial fibrillation: Secondary | ICD-10-CM | POA: Diagnosis present

## 2018-04-10 DIAGNOSIS — E1122 Type 2 diabetes mellitus with diabetic chronic kidney disease: Secondary | ICD-10-CM | POA: Diagnosis present

## 2018-04-10 DIAGNOSIS — N183 Chronic kidney disease, stage 3 (moderate): Secondary | ICD-10-CM | POA: Diagnosis present

## 2018-04-10 DIAGNOSIS — Z993 Dependence on wheelchair: Secondary | ICD-10-CM

## 2018-04-10 DIAGNOSIS — N179 Acute kidney failure, unspecified: Secondary | ICD-10-CM | POA: Diagnosis not present

## 2018-04-10 DIAGNOSIS — I482 Chronic atrial fibrillation: Secondary | ICD-10-CM | POA: Diagnosis not present

## 2018-04-10 DIAGNOSIS — S82832A Other fracture of upper and lower end of left fibula, initial encounter for closed fracture: Secondary | ICD-10-CM | POA: Diagnosis not present

## 2018-04-10 DIAGNOSIS — M6281 Muscle weakness (generalized): Secondary | ICD-10-CM | POA: Diagnosis not present

## 2018-04-10 DIAGNOSIS — Z6841 Body Mass Index (BMI) 40.0 and over, adult: Secondary | ICD-10-CM

## 2018-04-10 DIAGNOSIS — S8262XA Displaced fracture of lateral malleolus of left fibula, initial encounter for closed fracture: Secondary | ICD-10-CM | POA: Diagnosis not present

## 2018-04-10 DIAGNOSIS — I5032 Chronic diastolic (congestive) heart failure: Secondary | ICD-10-CM | POA: Diagnosis not present

## 2018-04-10 DIAGNOSIS — I10 Essential (primary) hypertension: Secondary | ICD-10-CM | POA: Diagnosis not present

## 2018-04-10 DIAGNOSIS — R0602 Shortness of breath: Secondary | ICD-10-CM | POA: Diagnosis not present

## 2018-04-10 DIAGNOSIS — I509 Heart failure, unspecified: Secondary | ICD-10-CM

## 2018-04-10 DIAGNOSIS — R0902 Hypoxemia: Secondary | ICD-10-CM | POA: Diagnosis not present

## 2018-04-10 DIAGNOSIS — S82401D Unspecified fracture of shaft of right fibula, subsequent encounter for closed fracture with routine healing: Secondary | ICD-10-CM | POA: Diagnosis not present

## 2018-04-10 DIAGNOSIS — M79605 Pain in left leg: Secondary | ICD-10-CM | POA: Diagnosis not present

## 2018-04-10 DIAGNOSIS — J449 Chronic obstructive pulmonary disease, unspecified: Secondary | ICD-10-CM | POA: Diagnosis present

## 2018-04-10 DIAGNOSIS — Z7401 Bed confinement status: Secondary | ICD-10-CM | POA: Diagnosis not present

## 2018-04-10 DIAGNOSIS — I5033 Acute on chronic diastolic (congestive) heart failure: Secondary | ICD-10-CM | POA: Diagnosis not present

## 2018-04-10 LAB — BASIC METABOLIC PANEL
Anion gap: 13 (ref 5–15)
BUN: 30 mg/dL — ABNORMAL HIGH (ref 8–23)
CALCIUM: 8.3 mg/dL — AB (ref 8.9–10.3)
CO2: 27 mmol/L (ref 22–32)
CREATININE: 1.89 mg/dL — AB (ref 0.44–1.00)
Chloride: 101 mmol/L (ref 98–111)
GFR calc Af Amer: 31 mL/min — ABNORMAL LOW (ref >60)
GFR, EST NON AFRICAN AMERICAN: 26 mL/min — AB (ref >60)
GLUCOSE: 238 mg/dL — AB (ref 70–99)
Potassium: 4.1 mmol/L (ref 3.5–5.1)
Sodium: 141 mmol/L (ref 135–145)

## 2018-04-10 LAB — GLUCOSE, CAPILLARY
Glucose-Capillary: 176 mg/dL — ABNORMAL HIGH (ref 70–99)
Glucose-Capillary: 199 mg/dL — ABNORMAL HIGH (ref 70–99)

## 2018-04-10 LAB — I-STAT TROPONIN, ED: Troponin i, poc: 0.01 ng/mL (ref 0.00–0.08)

## 2018-04-10 LAB — CBC
HCT: 31.6 % — ABNORMAL LOW (ref 36.0–46.0)
Hemoglobin: 9.2 g/dL — ABNORMAL LOW (ref 12.0–15.0)
MCH: 26.8 pg (ref 26.0–34.0)
MCHC: 29.1 g/dL — AB (ref 30.0–36.0)
MCV: 92.1 fL (ref 78.0–100.0)
PLATELETS: 306 10*3/uL (ref 150–400)
RBC: 3.43 MIL/uL — ABNORMAL LOW (ref 3.87–5.11)
RDW: 15.4 % (ref 11.5–15.5)
WBC: 9 10*3/uL (ref 4.0–10.5)

## 2018-04-10 LAB — BRAIN NATRIURETIC PEPTIDE: B Natriuretic Peptide: 1039.8 pg/mL — ABNORMAL HIGH (ref 0.0–100.0)

## 2018-04-10 MED ORDER — BUPROPION HCL ER (XL) 300 MG PO TB24
300.0000 mg | ORAL_TABLET | Freq: Every day | ORAL | Status: DC
  Administered 2018-04-11 – 2018-04-13 (×3): 300 mg via ORAL
  Filled 2018-04-10 (×3): qty 1

## 2018-04-10 MED ORDER — FLUTICASONE PROPIONATE 50 MCG/ACT NA SUSP
2.0000 | Freq: Every day | NASAL | Status: DC
  Administered 2018-04-11 – 2018-04-13 (×3): 2 via NASAL
  Filled 2018-04-10: qty 16

## 2018-04-10 MED ORDER — TIOTROPIUM BROMIDE MONOHYDRATE 18 MCG IN CAPS
18.0000 ug | ORAL_CAPSULE | Freq: Every day | RESPIRATORY_TRACT | Status: DC
  Administered 2018-04-11 – 2018-04-13 (×3): 18 ug via RESPIRATORY_TRACT
  Filled 2018-04-10: qty 5

## 2018-04-10 MED ORDER — ACETAMINOPHEN 325 MG PO TABS
650.0000 mg | ORAL_TABLET | Freq: Four times a day (QID) | ORAL | Status: DC | PRN
  Administered 2018-04-10 – 2018-04-12 (×5): 650 mg via ORAL
  Filled 2018-04-10 (×5): qty 2

## 2018-04-10 MED ORDER — TIOTROPIUM BROMIDE MONOHYDRATE 1.25 MCG/ACT IN AERS: 2.0000 | INHALATION_SPRAY | Freq: Every day | RESPIRATORY_TRACT | Status: DC

## 2018-04-10 MED ORDER — FUROSEMIDE 10 MG/ML IJ SOLN: 40.0000 mg | Freq: Two times a day (BID) | INTRAMUSCULAR | Status: DC

## 2018-04-10 MED ORDER — METOPROLOL TARTRATE 100 MG PO TABS
100.0000 mg | ORAL_TABLET | Freq: Two times a day (BID) | ORAL | Status: DC
  Administered 2018-04-10 – 2018-04-12 (×4): 100 mg via ORAL
  Filled 2018-04-10 (×4): qty 1

## 2018-04-10 MED ORDER — GLIPIZIDE 10 MG PO TABS
10.0000 mg | ORAL_TABLET | Freq: Two times a day (BID) | ORAL | Status: DC
  Administered 2018-04-10 – 2018-04-11 (×2): 10 mg via ORAL
  Filled 2018-04-10 (×3): qty 1

## 2018-04-10 MED ORDER — FUROSEMIDE 10 MG/ML IJ SOLN
60.0000 mg | Freq: Once | INTRAMUSCULAR | Status: AC
  Administered 2018-04-10: 60 mg via INTRAVENOUS
  Filled 2018-04-10: qty 6

## 2018-04-10 MED ORDER — ALBUTEROL SULFATE (2.5 MG/3ML) 0.083% IN NEBU: 2.5000 mg | INHALATION_SOLUTION | Freq: Four times a day (QID) | RESPIRATORY_TRACT | Status: DC | PRN

## 2018-04-10 MED ORDER — INSULIN ASPART 100 UNIT/ML ~~LOC~~ SOLN
0.0000 [IU] | Freq: Three times a day (TID) | SUBCUTANEOUS | Status: DC
  Administered 2018-04-10: 3 [IU] via SUBCUTANEOUS
  Administered 2018-04-11: 2 [IU] via SUBCUTANEOUS
  Administered 2018-04-11: 3 [IU] via SUBCUTANEOUS
  Administered 2018-04-11: 2 [IU] via SUBCUTANEOUS
  Administered 2018-04-12: 3 [IU] via SUBCUTANEOUS
  Administered 2018-04-12 (×2): 2 [IU] via SUBCUTANEOUS
  Administered 2018-04-13: 3 [IU] via SUBCUTANEOUS

## 2018-04-10 MED ORDER — FUROSEMIDE 10 MG/ML IJ SOLN
20.0000 mg | Freq: Two times a day (BID) | INTRAMUSCULAR | Status: DC
  Administered 2018-04-11 – 2018-04-13 (×5): 20 mg via INTRAVENOUS
  Filled 2018-04-10 (×5): qty 2

## 2018-04-10 MED ORDER — APIXABAN 5 MG PO TABS
10.0000 mg | ORAL_TABLET | Freq: Two times a day (BID) | ORAL | Status: DC
  Administered 2018-04-10 – 2018-04-13 (×6): 10 mg via ORAL
  Filled 2018-04-10 (×7): qty 2

## 2018-04-10 NOTE — ED Notes (Signed)
Attempted report 

## 2018-04-10 NOTE — Progress Notes (Addendum)
Pharmacy note: apixaban  Patient was noted on apixaban PTA for afib with LV thrombus (recently discharged on 04/07/18). Dose was 41m po bid last admission and this dose was re-ordered when admitted today.  It appears the patient was taking apixaban 576mpo bid at home. Discussed with admitting MD decision atr this time is to continue the dose prescribed at the last discharge (1035mo bid).   -There is very little data on apixaban in the setting of LV thrombus. Some reports are available that use dosing of 5mg85m bid but these are very small studies (case series or case reports).   Plan -Would follow with cardiology in the morning on the best approach to anticoagulation  AndrHildred LaserarmD Clinical Pharmacist Please check Amion for pharmacy contact number

## 2018-04-10 NOTE — ED Notes (Signed)
Placed pt on purewick

## 2018-04-10 NOTE — Progress Notes (Signed)
Received pt from the ED, she is in bed and denies any kind of pain.

## 2018-04-10 NOTE — ED Provider Notes (Signed)
[facility] EMERGENCY DEPARTMENT Provider Note   CSN: [id number] Arrival date & time: 04/10/18  1257     History   Chief Complaint Chief Complaint  Patient presents with  . Atrial Fibrillation  . Chest Pain    HPI [name] is a 67 y.o. female.  The history is provided by the patient and medical records. No language interpreter was used.     67 year old female with history of DM, HTN, obesity recently diagnosed atrial for ablation 2 weeks ago presenting with chest pain shortness of breath.  Patient report she was hospitalized for approximately 4 to 5 days due to shortness of breath, found to have new CHF as well as atrial fibrillation and she was discharged approximately 4 days ago.  She was placed on blood thinner medication as well as fluid pills.  She has been taking the medication as prescribed.  She however noticed worsening fluid gain approximately 5 pounds within the past 4 days as well as increased shortness of breath worse when she lays flat.  She complains of soreness in her chest which is mild.  She endorsed generalized weakness, unable to perform her normal daily activities.  Past Medical History:  Diagnosis Date  . Depression   . Diabetes mellitus without complication ([place])   . Hypertension   . Morbid obesity ([place])   . Osteoporosis   . UTI (lower urinary tract infection) 01/2016   Cipro for Klebsiella pneumoniae isolate    Patient Active Problem List   Diagnosis Date Noted  . SOB (shortness of breath)   . Acute CHF (congestive heart failure) ([place]) 04/01/2018  . Atrial fibrillation with RVR ([place]) 04/01/2018  . [place] 03/11/2016  . Muscle spasms of lower extremity 03/04/2016  . Acute renal insufficiency 02/26/2016  . History of MRSA infection 02/26/2016  . Diabetes type 2, uncontrolled ([place]) 02/19/2016  . HTN (hypertension), benign 02/19/2016  . Fracture, femur, distal ([place]) 02/19/2016  . Fall 02/19/2016  . Depression 02/19/2016  .  Closed left femoral fracture, initial encounter 02/19/2016  . Femur fracture, left ([place]) 02/19/2016    Past Surgical History:  Procedure Laterality Date  . ABDOMINAL HYSTERECTOMY    . CESAREAN SECTION    . CHOLECYSTECTOMY    . FEMUR IM NAIL Left 02/20/2016  . FEMUR IM NAIL Left 02/20/2016   Procedure: INTRAMEDULLARY (IM) RETROGRADE FEMORAL NAILING;  Surgeon: Leandrew Koyanagi, MD;  Location: [facility];  Service: Orthopedics;  Laterality: Left;  . PANCREAS SURGERY  1967   1 cyst excised and one cyst drained  . TEE WITHOUT CARDIOVERSION N/A 04/05/2018   Procedure: TRANSESOPHAGEAL ECHOCARDIOGRAM (TEE);  Surgeon: Dorothy Spark, MD;  Location: [facility];  Service: Cardiovascular;  Laterality: N/A;  . [name]    . WRIST FRACTURE SURGERY       OB History   None      Home Medications    Prior to Admission medications   Medication Sig Start Date End Date Taking? Authorizing Provider  apixaban (ELIQUIS) 5 MG TABS tablet Take 2 tablets (10 mg total) by mouth 2 (two) times daily for 4 days. 04/08/18 04/12/18  Desiree Hane, MD  apixaban (ELIQUIS) 5 MG TABS tablet Take 1 tablet (5 mg total) by mouth 2 (two) times daily. 04/13/18   Oretha Milch D, MD  buPROPion (WELLBUTRIN XL) 300 MG 24 hr tablet Take 300 mg by mouth daily. 07/06/13   [provider]  cetirizine (ZYRTEC) 10 MG tablet Take  10 mg by mouth at bedtime. 02/26/18   [provider]  furosemide (LASIX) 40 MG tablet Take 1 tablet (40 mg total) by mouth daily. 04/08/18   Desiree Hane, MD  glipiZIDE (GLUCOTROL) 10 MG tablet Take 10 mg by mouth 2 (two) times daily. 03/02/18   [provider]  metFORMIN (GLUCOPHAGE-XR) 500 MG 24 hr tablet Take 1,000 mg by mouth daily with supper. 01/23/18   [provider]  Metoprolol Tartrate 75 MG TABS Take 75 mg by mouth 2 (two) times daily. 04/08/18   Desiree Hane, MD  SPIRIVA RESPIMAT 1.25 MCG/ACT AERS Inhale 2 sprays into the lungs daily.  01/21/18   [provider]    Family History Family History  Problem Relation Age of Onset  . Diabetes Mother        died in her 15's of a stroke  . Cancer Mother   . Stroke Mother   . Breast cancer Mother   . Diabetes Father        died in his 9's of a stroke.  . Stroke Father   . Diabetes Brother        died @ 67 of a stroke.  . Stroke Brother   . Diabetes Maternal Grandmother   . Diabetes Maternal Grandfather   . Diabetes Paternal Grandmother   . Diabetes Paternal Grandfather   . Breast cancer Maternal Aunt     Social History Social History   Tobacco Use  . Smoking status: Never Smoker  . Smokeless tobacco: Never Used  Substance Use Topics  . Alcohol use: No  . Drug use: No     Allergies   Latex   Review of Systems Review of Systems  All other systems reviewed and are negative.    Physical Exam Updated Vital Signs BP (!) 155/95 (BP Location: Right Arm)   Pulse (!) 106   Temp 98.4 F (36.9 C) (Oral)   Resp 16   SpO2 100%   Physical Exam  Constitutional: She is oriented to person, place, and time. She appears well-developed and well-nourished. No distress.  Obese female in mild to moderate respiratory discomfort  HENT:  Head: Atraumatic.  Eyes: Conjunctivae are normal.  Neck: Neck supple.  Cardiovascular: Intact distal pulses and normal pulses.  Irregularly irregular heart rhythm without murmur rubs or gallops  Pulmonary/Chest: She has no decreased breath sounds. She has no wheezes. She has no rhonchi. She has rales in the right lower field and the left lower field.  Abdominal: Soft.  Musculoskeletal:       Right lower leg: She exhibits edema.       Left lower leg: She exhibits edema.  Bilateral 2+ pitting edema with intact distal pedal pulses.  Neurological: She is alert and oriented to person, place, and time.  Skin: No rash noted.  Psychiatric: She has a normal mood and affect.  Nursing note and vitals reviewed.    ED Treatments  / Results  Labs (all labs ordered are listed, but only abnormal results are displayed) Labs Reviewed  BASIC METABOLIC PANEL - Abnormal; Notable for the following components:      Result Value   Glucose, Bld 238 (*)    BUN 30 (*)    Creatinine, Ser 1.89 (*)    Calcium 8.3 (*)    GFR calc non Af Amer 26 (*)    GFR calc Af Amer 31 (*)    All other components within normal limits  CBC - Abnormal; Notable  for the following components:   RBC 3.43 (*)    Hemoglobin 9.2 (*)    HCT 31.6 (*)    MCHC 29.1 (*)    All other components within normal limits  BRAIN NATRIURETIC PEPTIDE - Abnormal; Notable for the following components:   B Natriuretic Peptide 1,039.8 (*)    All other components within normal limits  I-STAT TROPONIN, ED    EKG EKG Interpretation  Date/Time:  Saturday April 10 2018 13:05:05 EDT Ventricular Rate:  116 PR Interval:    QRS Duration: 70 QT Interval:  330 QTC Calculation: 458 R Axis:   37 Text Interpretation:  Atrial fibrillation with rapid ventricular response Artifact Abnormal ekg Confirmed by Carmin Muskrat [phone]) on 04/10/2018 2:37:25 PM   Radiology Dg Chest 2 View  Result Date: 04/10/2018 CLINICAL DATA:  Recent diagnosis of atrial fibrillation. Chest pain and shortness of breath. EXAM: CHEST - 2 VIEW COMPARISON:  April 01, 2018 FINDINGS: Stable cardiomegaly. The hila and mediastinum are unchanged. No pneumothorax. No pulmonary nodules or masses. No focal infiltrates. No overt edema. IMPRESSION: Cardiomegaly without overt edema. Electronically Signed   By: Dorise Bullion III M.D   On: 04/10/2018 14:00    Procedures Procedures (including critical care time)  Medications Ordered in ED Medications  furosemide (LASIX) injection 60 mg (60 mg Intravenous Given 04/10/18 1519)     Initial Impression / Assessment and Plan / ED Course  I have reviewed the triage vital signs and the nursing notes.  Pertinent labs & imaging results that were available  during my care of the patient were reviewed by me and considered in my medical decision making (see chart for details).     BP 120/80   Pulse (!) 110   Temp 98.4 F (36.9 C) (Oral)   Resp (!) 27   SpO2 98%    Final Clinical Impressions(s) / ED Diagnoses   Final diagnoses:  Acute on chronic systolic congestive heart failure (HCC)  Atrial fibrillation with rapid ventricular response [facility])    ED Discharge Orders    None     2:02 PM Patient recently diagnosed with new onset of atrial fibrillation with RVR during last hospitalization a few weeks ago.  She was found to have a left ventricle thrombus on TTE on 8/19 2019 therefore no cardioversion was attempted.  She was placed on Eliquis.  As well as metoprolol.  She also developed acute on chronic diastolic CHF and currently been treated with Lasix.  After she came home, symptoms progressively worsen prompting her to return.  3:14 PM Labs remarkable for elevated BNP of 1039 suggestive of acute on chronic CHF causing her symptoms.  Hemoglobin is 9.2 at baseline.  Chest x-ray showing cardiomegaly without overt edema.  EKG shows atrial fibrillations with RVR however her rate at this time is controlled at 106.  Her creatinine is 1.89 likely reflecting dehydration.  In the setting of CHF, will give additional Lasix at 60 mg IV and will consult for admission. Care discussed with Dr. Vanita Panda.   3:35 PM Appreciate consultation from medicine who agrees to see and admit pt for further management of acute on chronic CHF.  EF 55-60%.    Domenic Moras, PA-C 04/10/18 1540    Carmin Muskrat, MD 04/10/18 [phone]

## 2018-04-10 NOTE — ED Triage Notes (Signed)
Pt presents to ED with a recent (2 weeks) diagnosis of a-fib.  States they are having trouble controlling her rate, and she woke up today with chest pain, shortness of breath, fatigue, and generalized weakness.  States she had to sleep in the recliner last night because she could not lay flat without feeling short of breath last night.  Patient states she has also had a 5lb weight gain since Thursday.  Piptting edema in bilateral limbs

## 2018-04-10 NOTE — H&P (Addendum)
History and Physical    DOA: 04/10/2018  PCP: [name], MD  Patient coming from: Home  Chief Complaint: Recurrent leg swellings and orthopnea since last discharge  HPI: [name] is a 67 y.o. female with history h/o HTN, type 2 diabetes newly diagnosed A. fib and systolic heart failurewho presented on 8/15/2019with worsening shortness of breath and worsening lower extremity swelling not responsive to oral Lasix started by her cardiologist as an outpatientand was found to haveA. fib with RVR and acute diastolic CHFexacerbation.She was admitted here and discharged on 8/22 with instructions to f/u cardiology as outpatient for possible DCCV after 4 weeks of anticoagulation with Eliquis which was started last admission , if she still remains in A.fib. She was, in fact, found to have LV thrombus on TEE on 04/05/2018, sono cardioversion was attempted. Per d/c summary hospital course was also complicated by AKI  with peak creatinine of 2.74 due to congested kidney, improved to  1.16 on discharge. Patient also recommended sleep study as outpatient.  Pt is mostly wheelchair bound-at baseline, uses [place] lift to transfer, refused [facility] PT last admission.  Patient states upon last discharge she was able to sleep with 2 pillows.  However over the last 48 hours at home her orthopnea has worsened and she has not been able to sleep at night.  She has been laying in a chair at night with leg end elevated. She also reports chest tightness this morning, she has been concerned about weight gain of 5 pounds since last discharge and worsening leg swellings which brought her back to the ED today.  In the ED she was noted to be slightly tachycardic with heart rate in 110s, BNP 1000, creatinine 1.89 and chest x-ray negative for pulmonary edema.  Patient received 60 mg of IV Lasix and requested to be admitted for further diuresis.  Per patient her dry weight is 247 pounds.  She denies any chest pain  currently.  She denies any nausea, vomiting, diarrhea, fever, chills or palpitations at home.  She feels she is refractory to oral diuretics (was discharged on Lasix 40 mg daily).    Review of Systems: As per HPI otherwise 10 point review of systems negative.    Past Medical History:  Diagnosis Date  . Depression   . Diabetes mellitus without complication ([place])   . Hypertension   . Morbid obesity ([place])   . Osteoporosis   . UTI (lower urinary tract infection) 01/2016   Cipro for Klebsiella pneumoniae isolate    Past Surgical History:  Procedure Laterality Date  . ABDOMINAL HYSTERECTOMY    . CESAREAN SECTION    . CHOLECYSTECTOMY    . FEMUR IM NAIL Left 02/20/2016  . FEMUR IM NAIL Left 02/20/2016   Procedure: INTRAMEDULLARY (IM) RETROGRADE FEMORAL NAILING;  Surgeon: Leandrew Koyanagi, MD;  Location: [place];  Service: Orthopedics;  Laterality: Left;  . PANCREAS SURGERY  1967   1 cyst excised and one cyst drained  . TEE WITHOUT CARDIOVERSION N/A 04/05/2018   Procedure: TRANSESOPHAGEAL ECHOCARDIOGRAM (TEE);  Surgeon: Dorothy Spark, MD;  Location: [place];  Service: Cardiovascular;  Laterality: N/A;  . [place]    . WRIST FRACTURE SURGERY      Social history:  reports that she has never smoked. She has never used smokeless tobacco. She reports that she does not drink alcohol or use drugs.   Allergies  Allergen Reactions  . Latex Itching and Rash    Family History  Problem Relation Age of Onset  . Diabetes Mother        died in her 5's of a stroke  . Cancer Mother   . Stroke Mother   . Breast cancer Mother   . Diabetes Father        died in his 74's of a stroke.  . Stroke Father   . Diabetes Brother        died @ 11 of a stroke.  . Stroke Brother   . Diabetes Maternal Grandmother   . Diabetes Maternal Grandfather   . Diabetes Paternal Grandmother   . Diabetes Paternal Grandfather   . Breast cancer Maternal Aunt       Prior to Admission  medications   Medication Sig Start Date End Date Taking? Authorizing Provider  acetaminophen (TYLENOL) 500 MG tablet Take 1,500-2,000 mg by mouth every 6 (six) hours as needed for mild pain or headache.   Yes [provider]  apixaban (ELIQUIS) 5 MG TABS tablet Take 1 tablet (5 mg total) by mouth 2 (two) times daily. 04/13/18  Yes Oretha Milch D, MD  buPROPion (WELLBUTRIN XL) 300 MG 24 hr tablet Take 300 mg by mouth daily. 07/06/13  Yes [provider]  fluticasone (FLONASE) 50 MCG/ACT nasal spray Place 2 sprays into both nostrils daily.   Yes [provider]  furosemide (LASIX) 40 MG tablet Take 1 tablet (40 mg total) by mouth daily. 04/08/18  Yes Oretha Milch D, MD  glipiZIDE (GLUCOTROL) 10 MG tablet Take 10 mg by mouth 2 (two) times daily. 03/02/18  Yes [provider]  metFORMIN (GLUCOPHAGE-XR) 500 MG 24 hr tablet Take 1,000 mg by mouth daily with supper. 01/23/18  Yes [provider]  Metoprolol Tartrate 75 MG TABS Take 75 mg by mouth 2 (two) times daily. 04/08/18  Yes Desiree Hane, MD  SPIRIVA RESPIMAT 1.25 MCG/ACT AERS Inhale 2 sprays into the lungs daily. 01/21/18  Yes [provider]  apixaban (ELIQUIS) 5 MG TABS tablet Take 2 tablets (10 mg total) by mouth 2 (two) times daily for 4 days. Patient not taking: Reported on 04/10/2018 04/08/18 04/12/18  Desiree Hane, MD  cetirizine (ZYRTEC) 10 MG tablet Take 10 mg by mouth daily as needed for allergies.  02/26/18   [provider]    Physical Exam: Vitals:   04/10/18 1302 04/10/18 1509  BP: (!) 155/95 120/80  Pulse: (!) 106 (!) 110  Resp: 16 (!) 27  Temp: 98.4 F (36.9 C)   TempSrc: Oral   SpO2: 100% 98%    Constitutional: NAD, calm, comfortable Vitals:   04/10/18 1302 04/10/18 1509  BP: (!) 155/95 120/80  Pulse: (!) 106 (!) 110  Resp: 16 (!) 27  Temp: 98.4 F (36.9 C)   TempSrc: Oral   SpO2: 100% 98%   Eyes: PERRL, lids and conjunctivae normal ENMT: Mucous  membranes are moist. Posterior pharynx clear of any exudate or lesions.Normal dentition.  Neck: normal, supple, no masses, no thyromegaly Respiratory: clear to auscultation bilaterally, no wheezing, no crackles. Normal respiratory effort. No accessory muscle use.  Cardiovascular: Tachycardic irregular rate and rhythm, no murmurs / rubs / gallops.  1+ bilateral lower extremity pitting edema. 2+ pedal pulses. No carotid bruits.  Abdomen: no tenderness, no masses palpated. No hepatosplenomegaly. Bowel sounds positive.  Musculoskeletal: no clubbing / cyanosis. No joint deformity upper and lower extremities. Good ROM, no contractures. Normal muscle tone.  Neurologic: CN 2-12 grossly intact. Sensation intact, DTR normal. Strength 5/5  in all 4.  Psychiatric: Normal judgment and insight. Alert and oriented x 3. Normal mood.  SKIN/catheters: no rashes, lesions, ulcers. No induration  Labs on Admission: I have personally reviewed following labs and imaging studies  CBC: Recent Labs  Lab 04/05/18 0713 04/06/18 0436 04/07/18 0341 04/08/18 0415 04/10/18 1315  WBC 9.1 8.3 7.5 6.8 9.0  HGB 8.4* 10.0* 9.5* 8.8* 9.2*  HCT 29.1* 33.8* 33.1* 30.4* 31.6*  MCV 90.4 90.6 91.4 91.6 92.1  PLT 292 350 319 283 811   Basic Metabolic Panel: Recent Labs  Lab 04/05/18 0713 04/06/18 0436 04/06/18 0953 04/06/18 1457 04/07/18 0341 04/08/18 0903 04/10/18 1315  NA 141 136  --   --  140 142 141  K 4.8 6.2* 4.8 4.3 4.2 4.2 4.1  CL 109 107  --   --  108 109 101  CO2 25 19*  --   --  24 25 27   GLUCOSE 101* 254*  --   --  180* 182* 238*  BUN 37* 37*  --   --  27* 24* 30*  CREATININE 2.05* 1.86*  --   --  1.22* 1.16* 1.89*  CALCIUM 8.4* 8.5*  --   --  8.3* 8.7* 8.3*  PHOS 4.9* 4.3  --   --   --   --   --    GFR: Estimated Creatinine Clearance: 37 mL/min (A) (by C-G formula based on SCr of 1.89 mg/dL (H)). Liver Function Tests: Recent Labs  Lab 04/04/18 0353 04/05/18 0713 04/06/18 0436  AST 11*  --   --    ALT 12  --   --   ALKPHOS 63  --   --   BILITOT 0.5  --   --   PROT 5.5*  --   --   ALBUMIN 2.6* 2.5* 2.9*   No results for input(s): LIPASE, AMYLASE in the last 168 hours. No results for input(s): AMMONIA in the last 168 hours. Coagulation Profile: No results for input(s): INR, PROTIME in the last 168 hours. Cardiac Enzymes: No results for input(s): CKTOTAL, CKMB, CKMBINDEX, TROPONINI in the last 168 hours. BNP (last 3 results) No results for input(s): PROBNP in the last 8760 hours. HbA1C: No results for input(s): HGBA1C in the last 72 hours. CBG: Recent Labs  Lab 04/07/18 1143 04/07/18 1730 04/07/18 2125 04/08/18 0749 04/08/18 1152  GLUCAP 192* 151* 142* 154* 180*   Lipid Profile: No results for input(s): CHOL, HDL, LDLCALC, TRIG, CHOLHDL, LDLDIRECT in the last 72 hours. Thyroid Function Tests: No results for input(s): TSH, T4TOTAL, FREET4, T3FREE, THYROIDAB in the last 72 hours. Anemia Panel: No results for input(s): VITAMINB12, FOLATE, FERRITIN, TIBC, IRON, RETICCTPCT in the last 72 hours. Urine analysis:    Component Value Date/Time   COLORURINE AMBER (A) 04/04/2018 0401   APPEARANCEUR CLOUDY (A) 04/04/2018 0401   LABSPEC 1.017 04/04/2018 0401   PHURINE 6.0 04/04/2018 0401   GLUCOSEU NEGATIVE 04/04/2018 0401   HGBUR SMALL (A) 04/04/2018 0401   BILIRUBINUR NEGATIVE 04/04/2018 0401   KETONESUR NEGATIVE 04/04/2018 0401   PROTEINUR 100 (A) 04/04/2018 0401   UROBILINOGEN 0.2 12/01/2007 1025   NITRITE NEGATIVE 04/04/2018 0401   LEUKOCYTESUR LARGE (A) 04/04/2018 0401    Radiological Exams on Admission: Dg Chest 2 View  Result Date: 04/10/2018 CLINICAL DATA:  Recent diagnosis of atrial fibrillation. Chest pain and shortness of breath. EXAM: CHEST - 2 VIEW COMPARISON:  April 01, 2018 FINDINGS: Stable cardiomegaly. The hila and mediastinum are unchanged. No pneumothorax. No  pulmonary nodules or masses. No focal infiltrates. No overt edema. IMPRESSION: Cardiomegaly  without overt edema. Electronically Signed   By: Dorise Bullion III M.D   On: 04/10/2018 14:00    EKG: Independently reviewed.  A. fib with RVR heart rate 116     Assessment and Plan:   1.  Acute diastolic CHF exacerbation in the setting of rate uncontrolled A. fib: Will admit with IV diuretics.  Daily input/output monitoring with daily weights.  As mentioned above, patient's dry weight is 247 pounds.  She may need increased dose of oral diuretics upon discharge.    2.  Recent onset atrial fibrillation with LV thrombus:I have increased metoprolol dosage 200 mg twice daily (from 75 twice daily) for better rate control.  Resume anti-coagulation.  She is still on twice daily dosing until Tuesday.  Will need to follow-up cardiology (known to Dr. Al Pimple) for DC cardioversion and evaluation as planned at the time of previous discharge  3.  Acute kidney injury: Patient's serum creatinine is up to 1.38 now.  As mentioned in HPI, she did respond to diuretics previously suggesting congested kidney.  Monitor response with IV diuretics on labs in a.m. and adjust dosage as needed.  4.  Diabetes mellitus: Hold metformin in view of fluctuating serum creatinine.  Resume glipizide and ordered sliding scale insulin for now.  Diabetic diet  5.?  COPD: Patient noted to be on Spiriva as outpatient.  Resumed.  Albuterol as needed  DVT prophylaxis: On full anticoagulation  Code Status: Patient wants full CODE STATUS  Family Communication: Discussed with patient. Health care proxy would be husband Consults called: None. Admission status:  Patient admitted under observation status as anticipated LOS less than 2 midnights    Guilford Shi MD [facility] Pager [phone]  If 7PM-7AM, please contact night-coverage [email or web address] Password [facility]  04/10/2018, 3:28 PM

## 2018-04-11 DIAGNOSIS — I5023 Acute on chronic systolic (congestive) heart failure: Secondary | ICD-10-CM | POA: Diagnosis not present

## 2018-04-11 DIAGNOSIS — I5033 Acute on chronic diastolic (congestive) heart failure: Secondary | ICD-10-CM

## 2018-04-11 DIAGNOSIS — I513 Intracardiac thrombosis, not elsewhere classified: Secondary | ICD-10-CM | POA: Diagnosis not present

## 2018-04-11 DIAGNOSIS — E1122 Type 2 diabetes mellitus with diabetic chronic kidney disease: Secondary | ICD-10-CM

## 2018-04-11 DIAGNOSIS — I4891 Unspecified atrial fibrillation: Secondary | ICD-10-CM | POA: Diagnosis not present

## 2018-04-11 LAB — BASIC METABOLIC PANEL
Anion gap: 16 — ABNORMAL HIGH (ref 5–15)
BUN: 34 mg/dL — ABNORMAL HIGH (ref 8–23)
CO2: 24 mmol/L (ref 22–32)
Calcium: 8.4 mg/dL — ABNORMAL LOW (ref 8.9–10.3)
Chloride: 99 mmol/L (ref 98–111)
Creatinine, Ser: 2.07 mg/dL — ABNORMAL HIGH (ref 0.44–1.00)
GFR, EST AFRICAN AMERICAN: 27 mL/min — AB (ref >60)
GFR, EST NON AFRICAN AMERICAN: 24 mL/min — AB (ref >60)
Glucose, Bld: 185 mg/dL — ABNORMAL HIGH (ref 70–99)
POTASSIUM: 4.5 mmol/L (ref 3.5–5.1)
SODIUM: 139 mmol/L (ref 135–145)

## 2018-04-11 LAB — GLUCOSE, CAPILLARY
GLUCOSE-CAPILLARY: 176 mg/dL — AB (ref 70–99)
GLUCOSE-CAPILLARY: 137 mg/dL — AB (ref 70–99)
GLUCOSE-CAPILLARY: 87 mg/dL (ref 70–99)
Glucose-Capillary: 123 mg/dL — ABNORMAL HIGH (ref 70–99)
Glucose-Capillary: 66 mg/dL — ABNORMAL LOW (ref 70–99)

## 2018-04-11 MED ORDER — LORATADINE 10 MG PO TABS
10.0000 mg | ORAL_TABLET | Freq: Every day | ORAL | Status: DC
  Administered 2018-04-11 – 2018-04-13 (×3): 10 mg via ORAL
  Filled 2018-04-11 (×3): qty 1

## 2018-04-11 MED ORDER — GI COCKTAIL ~~LOC~~
30.0000 mL | Freq: Once | ORAL | Status: AC
  Administered 2018-04-11: 30 mL via ORAL
  Filled 2018-04-11: qty 30

## 2018-04-11 NOTE — Progress Notes (Signed)
PROGRESS NOTE  [name] IWL:[id number] DOB: September 30, 1950 DOA: 04/10/2018 PCP: [name], MD   LOS: 0 days   Brief Narrative / Interim history: 67 year old morbidly obese and sedentary female who was admitted to the hospital few days ago with new onset A. fib and diastolic CHF, was diuresed and cardiology was consulted, there were consideration for cardioversion however on TEE she was found to have a left atrial thrombus.  She was placed on anticoagulation with Eliquis, improved with Lasix and was discharged home.  She continued to feel poorly at home tired, short of breath and unable to lay flat, her swelling has returned and she was brought back to the ED and admitted on 8/24.  Assessment & Plan: Active Problems:   CHF exacerbation (HCC)   Acute on chronic diastolic CHF in the setting of poorly controlled A. fib -Patient was started on IV diuretics, continue.  Feels better this morning and she looks less fluid overloaded.  Recent onset A. fib, with RVR -She does not seem to be tolerating well the A. fib with persistent fatigue, fluid overload, shortness of breath -Metoprolol was increased on admission to 100 twice daily, rates a little bit better but still RVR at times this morning on telemetry.  Suspect controlling the fluid with Lasix would also help the RVR and for now keep current metoprolol dose. -TEE results noted, with left atrial thrombus, will have a repeat imaging with possible cardioversion after she has been on anticoagulation for a few weeks  Left atrial thrombus -She was started on Eliquis however it appears that she was not taking it as prescribed of 10 mg twice daily but only 5 mg twice daily.  Discussed with Dr. Johnsie Cancel with cardiology, continue Eliquis for now  Acute kidney injury on chronic kidney disease, unknown stage -Patient was told by her PCP for quite some time that her kidneys are abnormal but she does not know to what extent.  I am not clear  about baseline creatinine.  She definitely is fluid overloaded and will continue IV Lasix.  Creatinine is gone up to 2.0.  We will continue to monitor urine output, if continues to rise we will discontinue Lasix.  Obtain bladder scan to evaluate for retention  Type 2 diabetes mellitus -Hold glipizide in the setting of kidney disease, keep on sliding scale alone  Hypertension -Continue metoprolol, blood pressure controlled  Anemia -Likely from chronic kidney disease, hemoglobin stable  Debility -She is mostly wheelchair-bound at home, uses [place] lift transfer.  She is supposed to get home health PT next week for her.  She is against SNF as she is caring for her 44-year-old grandson at home   DVT prophylaxis: on Eliquis  Code Status: Full code Family Communication: no family at bedside Disposition Plan: home when ready   Consultants:   None  Procedures:   None  Antimicrobials:  None  Subjective: -Continues to feel tired and fatigued, short of breath when she lays flat.  Denies any chest pain, no abdominal pain, nausea or vomiting  Objective: Vitals:   04/11/18 0100 04/11/18 0340 04/11/18 0710 04/11/18 0831  BP:  134/87  (!) 144/90  Pulse:  99  80  Resp:  18    Temp:  97.6 F (36.4 C)    TempSrc:  Oral    SpO2:  97% 97%   Weight: 122 kg     Height:        Intake/Output Summary (Last 24 hours) at 04/11/2018 1106 Last data  filed at 04/11/2018 3976 Gross per 24 hour  Intake 840 ml  Output 1300 ml  Net -460 ml   Filed Weights   04/10/18 1754 04/11/18 0100  Weight: 120.4 kg 122 kg    Examination:  Constitutional: NAD Eyes: lids and conjunctivae normal ENMT: Mucous membranes are moist.  Respiratory: Faint bibasilar crackles, no wheezing heard.  Normal respiratory effort. Cardiovascular: Irregularly irregular, tachycardic.  Good peripheral pulses.  1+ pitting lower extremity edema Abdomen: no tenderness. Bowel sounds positive.  Skin: no rashes Neurologic: CN  2-12 grossly intact. Strength 5/5 in all 4.    Data Reviewed: I have independently reviewed following labs and imaging studies  Chest x-ray 8/24 -cardiomegaly, slight increased pulmonary vasculature  CBC: Recent Labs  Lab 04/05/18 0713 04/06/18 0436 04/07/18 0341 04/08/18 0415 04/10/18 1315  WBC 9.1 8.3 7.5 6.8 9.0  HGB 8.4* 10.0* 9.5* 8.8* 9.2*  HCT 29.1* 33.8* 33.1* 30.4* 31.6*  MCV 90.4 90.6 91.4 91.6 92.1  PLT 292 350 319 283 734   Basic Metabolic Panel: Recent Labs  Lab 04/05/18 0713 04/06/18 0436  04/06/18 1457 04/07/18 0341 04/08/18 0903 04/10/18 1315 04/11/18 0638  NA 141 136  --   --  140 142 141 139  K 4.8 6.2*   < > 4.3 4.2 4.2 4.1 4.5  CL 109 107  --   --  108 109 101 99  CO2 25 19*  --   --  24 25 27 24   GLUCOSE 101* 254*  --   --  180* 182* 238* 185*  BUN 37* 37*  --   --  27* 24* 30* 34*  CREATININE 2.05* 1.86*  --   --  1.22* 1.16* 1.89* 2.07*  CALCIUM 8.4* 8.5*  --   --  8.3* 8.7* 8.3* 8.4*  PHOS 4.9* 4.3  --   --   --   --   --   --    < > = values in this interval not displayed.   GFR: Estimated Creatinine Clearance: 34 mL/min (A) (by C-G formula based on SCr of 2.07 mg/dL (H)). Liver Function Tests: Recent Labs  Lab 04/05/18 0713 04/06/18 0436  ALBUMIN 2.5* 2.9*   No results for input(s): LIPASE, AMYLASE in the last 168 hours. No results for input(s): AMMONIA in the last 168 hours. Coagulation Profile: No results for input(s): INR, PROTIME in the last 168 hours. Cardiac Enzymes: No results for input(s): CKTOTAL, CKMB, CKMBINDEX, TROPONINI in the last 168 hours. BNP (last 3 results) No results for input(s): PROBNP in the last 8760 hours. HbA1C: No results for input(s): HGBA1C in the last 72 hours. CBG: Recent Labs  Lab 04/08/18 0749 04/08/18 1152 04/10/18 1758 04/10/18 2120 04/11/18 0738  GLUCAP 154* 180* 176* 199* 176*   Lipid Profile: No results for input(s): CHOL, HDL, LDLCALC, TRIG, CHOLHDL, LDLDIRECT in the last 72  hours. Thyroid Function Tests: No results for input(s): TSH, T4TOTAL, FREET4, T3FREE, THYROIDAB in the last 72 hours. Anemia Panel: No results for input(s): VITAMINB12, FOLATE, FERRITIN, TIBC, IRON, RETICCTPCT in the last 72 hours. Urine analysis:    Component Value Date/Time   COLORURINE AMBER (A) 04/04/2018 0401   APPEARANCEUR CLOUDY (A) 04/04/2018 0401   LABSPEC 1.017 04/04/2018 0401   PHURINE 6.0 04/04/2018 0401   GLUCOSEU NEGATIVE 04/04/2018 0401   HGBUR SMALL (A) 04/04/2018 0401   BILIRUBINUR NEGATIVE 04/04/2018 0401   KETONESUR NEGATIVE 04/04/2018 0401   PROTEINUR 100 (A) 04/04/2018 0401   UROBILINOGEN 0.2 12/01/2007 1025  NITRITE NEGATIVE 04/04/2018 0401   LEUKOCYTESUR LARGE (A) 04/04/2018 0401   Sepsis Labs: Invalid input(s): PROCALCITONIN, LACTICIDVEN  Recent Results (from the past 240 hour(s))  MRSA PCR Screening     Status: None   Collection Time: 04/02/18  3:53 AM  Result Value Ref Range Status   MRSA by PCR NEGATIVE NEGATIVE Final    Comment:        The GeneXpert MRSA Assay (FDA approved for NASAL specimens only), is one component of a comprehensive MRSA colonization surveillance program. It is not intended to diagnose MRSA infection nor to guide or monitor treatment for MRSA infections. Performed at [facility], [place] [address], [place], [place] [address]   Culture, Urine     Status: Abnormal   Collection Time: 04/03/18  2:22 PM  Result Value Ref Range Status   Specimen Description URINE, RANDOM  Final   Special Requests   Final    NONE Performed at [facility], [place] [address] [place], [place] [address]    Culture >=100,000 COLONIES/mL KLEBSIELLA PNEUMONIAE (A)  Final   Report Status 04/06/2018 FINAL  Final   Organism ID, Bacteria KLEBSIELLA PNEUMONIAE (A)  Final      Susceptibility   Klebsiella pneumoniae - MIC*    AMPICILLIN RESISTANT Resistant     CEFAZOLIN <=4 SENSITIVE Sensitive     CEFTRIAXONE <=1 SENSITIVE Sensitive      CIPROFLOXACIN <=0.25 SENSITIVE Sensitive     GENTAMICIN <=1 SENSITIVE Sensitive     IMIPENEM <=0.25 SENSITIVE Sensitive     NITROFURANTOIN <=16 SENSITIVE Sensitive     TRIMETH/SULFA <=20 SENSITIVE Sensitive     AMPICILLIN/SULBACTAM <=2 SENSITIVE Sensitive     PIP/TAZO <=4 SENSITIVE Sensitive     Extended ESBL NEGATIVE Sensitive     * >=100,000 COLONIES/mL KLEBSIELLA PNEUMONIAE      Radiology Studies: Dg Chest 2 View  Result Date: 04/10/2018 CLINICAL DATA:  Recent diagnosis of atrial fibrillation. Chest pain and shortness of breath. EXAM: CHEST - 2 VIEW COMPARISON:  April 01, 2018 FINDINGS: Stable cardiomegaly. The hila and mediastinum are unchanged. No pneumothorax. No pulmonary nodules or masses. No focal infiltrates. No overt edema. IMPRESSION: Cardiomegaly without overt edema. Electronically Signed   By: Dorise Bullion III M.D   On: 04/10/2018 14:00     Scheduled Meds: . apixaban  10 mg Oral BID  . buPROPion  300 mg Oral Daily  . fluticasone  2 spray Each Nare Daily  . furosemide  20 mg Intravenous Q12H  . glipiZIDE  10 mg Oral BID WC  . insulin aspart  0-15 Units Subcutaneous TID WC  . metoprolol tartrate  100 mg Oral BID  . tiotropium  18 mcg Inhalation Daily   Continuous Infusions:    Marzetta Board, MD, PhD [facility] Pager [phone]  If 7PM-7AM, please contact night-coverage [email or web address] Password [email or web address] 04/11/2018, 11:06 AM

## 2018-04-11 NOTE — Progress Notes (Signed)
Hypoglycemic Event  CBG: 66  Treatment: chocolate pudding   Symptoms: none  Follow-up CBG: Time: 2155 CBG Result: 87  Possible Reasons for Event:  2 units NovoLog given at 1731.Marland KitchenMarland Kitchenpatient did not eat much dinner because she did not like it.   Comments/MD notified:protocol in place    [place]

## 2018-04-11 NOTE — Progress Notes (Signed)
Bladder scan was done and patient had 138m, pt is voiding okay with no discomfort. According to the verbal order from the MD, an in and out was not needed at this time. Will continue to monitor.

## 2018-04-12 DIAGNOSIS — Z6841 Body Mass Index (BMI) 40.0 and over, adult: Secondary | ICD-10-CM | POA: Diagnosis not present

## 2018-04-12 DIAGNOSIS — I5033 Acute on chronic diastolic (congestive) heart failure: Secondary | ICD-10-CM | POA: Diagnosis not present

## 2018-04-12 DIAGNOSIS — Z9111 Patient's noncompliance with dietary regimen: Secondary | ICD-10-CM | POA: Diagnosis not present

## 2018-04-12 DIAGNOSIS — Z91040 Latex allergy status: Secondary | ICD-10-CM | POA: Diagnosis not present

## 2018-04-12 DIAGNOSIS — I5032 Chronic diastolic (congestive) heart failure: Secondary | ICD-10-CM | POA: Diagnosis not present

## 2018-04-12 DIAGNOSIS — S82832A Other fracture of upper and lower end of left fibula, initial encounter for closed fracture: Secondary | ICD-10-CM | POA: Diagnosis not present

## 2018-04-12 DIAGNOSIS — I5043 Acute on chronic combined systolic (congestive) and diastolic (congestive) heart failure: Secondary | ICD-10-CM | POA: Diagnosis present

## 2018-04-12 DIAGNOSIS — F329 Major depressive disorder, single episode, unspecified: Secondary | ICD-10-CM | POA: Diagnosis present

## 2018-04-12 DIAGNOSIS — M79605 Pain in left leg: Secondary | ICD-10-CM | POA: Diagnosis not present

## 2018-04-12 DIAGNOSIS — S8262XA Displaced fracture of lateral malleolus of left fibula, initial encounter for closed fracture: Secondary | ICD-10-CM | POA: Diagnosis not present

## 2018-04-12 DIAGNOSIS — I513 Intracardiac thrombosis, not elsewhere classified: Secondary | ICD-10-CM | POA: Diagnosis present

## 2018-04-12 DIAGNOSIS — I482 Chronic atrial fibrillation: Secondary | ICD-10-CM | POA: Diagnosis not present

## 2018-04-12 DIAGNOSIS — Z7401 Bed confinement status: Secondary | ICD-10-CM | POA: Diagnosis not present

## 2018-04-12 DIAGNOSIS — S8254XA Nondisplaced fracture of medial malleolus of right tibia, initial encounter for closed fracture: Secondary | ICD-10-CM | POA: Diagnosis not present

## 2018-04-12 DIAGNOSIS — I4891 Unspecified atrial fibrillation: Secondary | ICD-10-CM | POA: Diagnosis not present

## 2018-04-12 DIAGNOSIS — I130 Hypertensive heart and chronic kidney disease with heart failure and stage 1 through stage 4 chronic kidney disease, or unspecified chronic kidney disease: Secondary | ICD-10-CM | POA: Diagnosis present

## 2018-04-12 DIAGNOSIS — M79604 Pain in right leg: Secondary | ICD-10-CM | POA: Diagnosis not present

## 2018-04-12 DIAGNOSIS — D631 Anemia in chronic kidney disease: Secondary | ICD-10-CM | POA: Diagnosis present

## 2018-04-12 DIAGNOSIS — Z833 Family history of diabetes mellitus: Secondary | ICD-10-CM | POA: Diagnosis not present

## 2018-04-12 DIAGNOSIS — R0902 Hypoxemia: Secondary | ICD-10-CM | POA: Diagnosis not present

## 2018-04-12 DIAGNOSIS — R000 Tachycardia, unspecified: Secondary | ICD-10-CM | POA: Diagnosis not present

## 2018-04-12 DIAGNOSIS — Z7951 Long term (current) use of inhaled steroids: Secondary | ICD-10-CM | POA: Diagnosis not present

## 2018-04-12 DIAGNOSIS — Z7901 Long term (current) use of anticoagulants: Secondary | ICD-10-CM | POA: Diagnosis not present

## 2018-04-12 DIAGNOSIS — J449 Chronic obstructive pulmonary disease, unspecified: Secondary | ICD-10-CM | POA: Diagnosis present

## 2018-04-12 DIAGNOSIS — Z7984 Long term (current) use of oral hypoglycemic drugs: Secondary | ICD-10-CM | POA: Diagnosis not present

## 2018-04-12 DIAGNOSIS — N179 Acute kidney failure, unspecified: Secondary | ICD-10-CM | POA: Diagnosis present

## 2018-04-12 DIAGNOSIS — E1165 Type 2 diabetes mellitus with hyperglycemia: Secondary | ICD-10-CM | POA: Diagnosis not present

## 2018-04-12 DIAGNOSIS — R5381 Other malaise: Secondary | ICD-10-CM | POA: Diagnosis present

## 2018-04-12 DIAGNOSIS — S82102A Unspecified fracture of upper end of left tibia, initial encounter for closed fracture: Secondary | ICD-10-CM | POA: Diagnosis not present

## 2018-04-12 DIAGNOSIS — I5023 Acute on chronic systolic (congestive) heart failure: Secondary | ICD-10-CM

## 2018-04-12 DIAGNOSIS — S79912A Unspecified injury of left hip, initial encounter: Secondary | ICD-10-CM | POA: Diagnosis not present

## 2018-04-12 DIAGNOSIS — I10 Essential (primary) hypertension: Secondary | ICD-10-CM | POA: Diagnosis not present

## 2018-04-12 DIAGNOSIS — Z993 Dependence on wheelchair: Secondary | ICD-10-CM | POA: Diagnosis not present

## 2018-04-12 DIAGNOSIS — I959 Hypotension, unspecified: Secondary | ICD-10-CM | POA: Diagnosis not present

## 2018-04-12 DIAGNOSIS — S82831A Other fracture of upper and lower end of right fibula, initial encounter for closed fracture: Secondary | ICD-10-CM | POA: Diagnosis not present

## 2018-04-12 DIAGNOSIS — E1122 Type 2 diabetes mellitus with diabetic chronic kidney disease: Secondary | ICD-10-CM | POA: Diagnosis present

## 2018-04-12 DIAGNOSIS — N183 Chronic kidney disease, stage 3 (moderate): Secondary | ICD-10-CM | POA: Diagnosis not present

## 2018-04-12 DIAGNOSIS — S82152A Displaced fracture of left tibial tuberosity, initial encounter for closed fracture: Secondary | ICD-10-CM | POA: Diagnosis not present

## 2018-04-12 DIAGNOSIS — Z90710 Acquired absence of both cervix and uterus: Secondary | ICD-10-CM | POA: Diagnosis not present

## 2018-04-12 DIAGNOSIS — Z79899 Other long term (current) drug therapy: Secondary | ICD-10-CM | POA: Diagnosis not present

## 2018-04-12 DIAGNOSIS — R0601 Orthopnea: Secondary | ICD-10-CM | POA: Diagnosis present

## 2018-04-12 DIAGNOSIS — S82839A Other fracture of upper and lower end of unspecified fibula, initial encounter for closed fracture: Secondary | ICD-10-CM | POA: Diagnosis not present

## 2018-04-12 DIAGNOSIS — R002 Palpitations: Secondary | ICD-10-CM | POA: Diagnosis not present

## 2018-04-12 DIAGNOSIS — M810 Age-related osteoporosis without current pathological fracture: Secondary | ICD-10-CM | POA: Diagnosis present

## 2018-04-12 LAB — CBC
HEMATOCRIT: 32.4 % — AB (ref 36.0–46.0)
HEMOGLOBIN: 9.8 g/dL — AB (ref 12.0–15.0)
MCH: 27.1 pg (ref 26.0–34.0)
MCHC: 30.2 g/dL (ref 30.0–36.0)
MCV: 89.5 fL (ref 78.0–100.0)
Platelets: 299 10*3/uL (ref 150–400)
RBC: 3.62 MIL/uL — ABNORMAL LOW (ref 3.87–5.11)
RDW: 15.9 % — ABNORMAL HIGH (ref 11.5–15.5)
WBC: 10.5 10*3/uL (ref 4.0–10.5)

## 2018-04-12 LAB — BASIC METABOLIC PANEL
ANION GAP: 12 (ref 5–15)
BUN: 36 mg/dL — ABNORMAL HIGH (ref 8–23)
CHLORIDE: 101 mmol/L (ref 98–111)
CO2: 25 mmol/L (ref 22–32)
Calcium: 8.1 mg/dL — ABNORMAL LOW (ref 8.9–10.3)
Creatinine, Ser: 1.84 mg/dL — ABNORMAL HIGH (ref 0.44–1.00)
GFR calc Af Amer: 32 mL/min — ABNORMAL LOW (ref >60)
GFR calc non Af Amer: 27 mL/min — ABNORMAL LOW (ref >60)
Glucose, Bld: 148 mg/dL — ABNORMAL HIGH (ref 70–99)
POTASSIUM: 4.5 mmol/L (ref 3.5–5.1)
Sodium: 138 mmol/L (ref 135–145)

## 2018-04-12 LAB — GLUCOSE, CAPILLARY
Glucose-Capillary: 142 mg/dL — ABNORMAL HIGH (ref 70–99)
Glucose-Capillary: 126 mg/dL — ABNORMAL HIGH (ref 70–99)
Glucose-Capillary: 152 mg/dL — ABNORMAL HIGH (ref 70–99)
Glucose-Capillary: 142 mg/dL — ABNORMAL HIGH (ref 70–99)

## 2018-04-12 MED ORDER — METOPROLOL TARTRATE 25 MG PO TABS
125.0000 mg | ORAL_TABLET | Freq: Two times a day (BID) | ORAL | Status: DC
  Administered 2018-04-12: 125 mg via ORAL
  Filled 2018-04-12: qty 1

## 2018-04-12 MED ORDER — METOPROLOL TARTRATE 25 MG PO TABS
25.0000 mg | ORAL_TABLET | Freq: Once | ORAL | Status: AC
  Administered 2018-04-12: 25 mg via ORAL
  Filled 2018-04-12: qty 1

## 2018-04-12 NOTE — Evaluation (Signed)
Physical Therapy Evaluation Patient Details Name: [name] MRN: [id number] DOB: Jun 14, 1951 Today's Date: 04/12/2018   History of Present Illness  67yo female with recent hospitalization starting on 8/15 where she was diagnosed with A-fib with RVR and CHF exacerbation, found to have LV thrombus on 8/19 and placed on eliquis, and discharged on 04/08/18. She now presents with orthopnea, weight gain, and increased LE swelling. Diagnosed with CHF exacerbation in setting of uncontrolled A-fib, A-fib with LV thrombus, and AKI. PMH DM, HTN, obesity, femoral IM nail L, hx wrist fracture surgery   Clinical Impression   Patient received in bed, pleasant and willing to participate in skilled PT session today. She reports increased levels of fatigue and requires extended time and increased effort to perform functional bed mobility with HOB elevated today, as well as for functional transfers with RW. She politely declines gait with RW due to fear of falling as she strongly prefers walkers without the front wheels; gait/progression of mobility also limited by elevated HR 97-124BPM this session per finger pulse oximeter. She appears much more deconditioned and to have lost a considerable amount of strength during short amount of time since last PT evaluation. She was left up in the chair with alarm active and all other needs met this afternoon. Recommend ST-SNF, however patient adamantly refuses- as such she will need HHPT and strongly encouraged her to consider in-home aide as well.     Follow Up Recommendations SNF;Home health PT;Other (comment)(recommend SNF but she adamantly refuses; will need HHPT and also discussed home aide )    Equipment Recommendations  None recommended by PT    Recommendations for Other Services       Precautions / Restrictions Precautions Precautions: Fall;Other (comment) Precaution Comments: watch HR  Restrictions Weight Bearing Restrictions: No      Mobility  Bed  Mobility Overal bed mobility: Needs Assistance Bed Mobility: Supine to Sit     Supine to sit: Min guard;HOB elevated     General bed mobility comments: min guard, extended time  Transfers Overall transfer level: Needs assistance Equipment used: Rolling walker (2 wheeled) Transfers: Sit to/from [facility] Sit to Stand: Min guard Stand pivot transfers: Min guard       General transfer comment: min guard for safety, no physical assist given   Ambulation/Gait             General Gait Details: did not progress due to ongoing HR fluctuations (97-124BPM), also patient unwilling to attempt ambulation with RW due to fear of falling (prefers standard walker without wheels)  Stairs            Wheelchair Mobility    Modified Rankin (Stroke Patients Only)       Balance Overall balance assessment: History of Falls;Mild deficits observed, not formally tested                                           Pertinent Vitals/Pain Pain Assessment: No/denies pain    Home Living Family/patient expects to be discharged to:: Private residence Living Arrangements: Spouse/significant other Available Help at Discharge: Family;Available PRN/intermittently Type of Home: Mobile home Home Access: Ramped entrance     Home Layout: One level Home Equipment: Walker - standard;Bedside commode;Grab bars - tub/shower;Shower seat;Wheelchair - manual;Tub bench Additional Comments: spouse works full time but is retiring in one month per patient  Prior Function Level of Independence: Independent with assistive device(s)         Comments: but with frequent falls      Hand Dominance        Extremity/Trunk Assessment   Upper Extremity Assessment Upper Extremity Assessment: Defer to OT evaluation    Lower Extremity Assessment Lower Extremity Assessment: Generalized weakness    Cervical / Trunk Assessment Cervical / Trunk Assessment: Kyphotic   Communication   Communication: No difficulties  Cognition Arousal/Alertness: Awake/alert Behavior During Therapy: WFL for tasks assessed/performed Overall Cognitive Status: Within Functional Limits for tasks assessed                                        General Comments General comments (skin integrity, edema, etc.): HR fluctuation between 97-124BPM. Did not progress mobility due to safety concerns with HR today.     Exercises     Assessment/Plan    PT Assessment Patient needs continued PT services  PT Problem List Decreased strength;Decreased mobility;Decreased coordination;Obesity;Decreased activity tolerance;Cardiopulmonary status limiting activity;Decreased balance       PT Treatment Interventions DME instruction;Therapeutic activities;Gait training;Therapeutic exercise;Patient/family education;Stair training;Balance training;Functional mobility training;Neuromuscular re-education    PT Goals (Current goals can be found in the Care Plan section)  Acute Rehab PT Goals Patient Stated Goal: to go home and not fall anymore  PT Goal Formulation: With patient Time For Goal Achievement: 04/26/18 Potential to Achieve Goals: Good    Frequency Min 3X/week   Barriers to discharge        Co-evaluation               AM-PAC PT "6 Clicks" Daily Activity  Outcome Measure Difficulty turning over in bed (including adjusting bedclothes, sheets and blankets)?: A Little Difficulty moving from lying on back to sitting on the side of the bed? : A Little Difficulty sitting down on and standing up from a chair with arms (e.g., wheelchair, bedside commode, etc,.)?: A Little Help needed moving to and from a bed to chair (including a wheelchair)?: A Little Help needed walking in hospital room?: A Little Help needed climbing 3-5 steps with a railing? : A Lot 6 Click Score: 17    End of Session   Activity Tolerance: Patient limited by fatigue Patient left: in  chair;with chair alarm set;with call bell/phone within reach Nurse Communication: Mobility status PT Visit Diagnosis: Muscle weakness (generalized) (M62.81);History of falling (Z91.81)    Time: 2542-7062 PT Time Calculation (min) (ACUTE ONLY): 38 min   Charges:   PT Evaluation $PT Eval Moderate Complexity: 1 Mod PT Treatments $Therapeutic Activity: 8-22 mins $Self Care/Home Management: 8-22        Deniece Ree PT, DPT, CBIS  Supplemental Physical Therapist [facility]   Pager [phone]

## 2018-04-12 NOTE — Progress Notes (Signed)
PROGRESS NOTE  [name] NLZ:[id number] DOB: August 02, 1951 DOA: 04/10/2018 PCP: [name], MD   LOS: 0 days   Brief Narrative / Interim history: 67 year old morbidly obese and sedentary female who was admitted to the hospital few days ago with new onset A. fib and diastolic CHF, was diuresed and cardiology was consulted, there were consideration for cardioversion however on TEE she was found to have a left atrial thrombus.  She was placed on anticoagulation with Eliquis, improved with Lasix and was discharged home.  She continued to feel poorly at home tired, short of breath and unable to lay flat, her swelling has returned and she was brought back to the ED and admitted on 8/24.  Assessment & Plan: Active Problems:   CHF exacerbation (HCC)   Acute on chronic diastolic CHF in the setting of poorly controlled A. fib -Patient was started on IV diuretics, continue. -Feels improved  Recent onset A. fib, with RVR -She does not seem to be tolerating well the A. fib with persistent fatigue, fluid overload, shortness of breath -Metoprolol was increased on admission to 100 twice daily, rates better but still fast at times and currently not optimally controlled.  Increase metoprolol to 125 twice daily -TEE results noted, with left atrial thrombus, will have a repeat imaging with possible cardioversion after she has been on anticoagulation for a few weeks  Left atrial thrombus -She was started on Eliquis however it appears that she was not taking it as prescribed of 10 mg twice daily but only 5 mg twice daily.  Discussed with Dr. Johnsie Cancel with cardiology, continue Eliquis for now  Acute kidney injury on chronic kidney disease, unknown stage -Patient was told by her PCP for quite some time that her kidneys are abnormal but she does not know to what extent.  I am not clear about baseline creatinine.  She definitely is fluid overloaded and will continue IV Lasix. -Creatinine up to 2.0  yesterday but improved to 1.8 this morning.  Continue to monitor.  Type 2 diabetes mellitus -Hold glipizide in the setting of kidney disease, keep on sliding scale alone -CBGs well-controlled currently  Hypertension -Continue metoprolol, blood pressure well controlled.  Increase metoprolol as above  Anemia -Likely from chronic kidney disease, hemoglobin stable  Debility -She is mostly wheelchair-bound at home, uses [place] lift transfer.  She is supposed to get home health PT next week for her.  She is against SNF as she is caring for her 9-year-old grandson at home -PT eval pending today   DVT prophylaxis: on Eliquis  Code Status: Full code Family Communication: Husband present at bedside Disposition Plan: home when ready   Consultants:   None  Procedures:   None  Antimicrobials:  None  Subjective: -Feels better, stronger.  Denies shortness of breath at rest.  No chest pain.  Objective: Vitals:   04/12/18 0415 04/12/18 0820 04/12/18 0837 04/12/18 1100  BP: 137/87  137/81 (!) 130/91  Pulse: (!) 103  91 (!) 106  Resp: 19     Temp: 97.7 F (36.5 C)   98 F (36.7 C)  TempSrc: Oral   Oral  SpO2:  97%  (!) 88%  Weight:      Height:        Intake/Output Summary (Last 24 hours) at 04/12/2018 1114 Last data filed at 04/12/2018 0837 Gross per 24 hour  Intake 840 ml  Output 1500 ml  Net -660 ml   Filed Weights   04/10/18 1754 04/11/18 0100 04/12/18  0408  Weight: 120.4 kg 122 kg 118.9 kg    Examination:  Constitutional: No distress Eyes: No scleral icterus ENMT: Moist mucous membranes Respiratory: No crackles, no wheezing.  Normal respiratory effort Cardiovascular: Irregularly irregular, tachycardic.  Good pulses.  Trace lower extremity edema Abdomen: Soft, nontender, nondistended, positive bowel sounds Skin: No rashes seen Neurologic: Nonfocal   Data Reviewed: I have independently reviewed following labs and imaging studies    CBC: Recent Labs  Lab  04/06/18 0436 04/07/18 0341 04/08/18 0415 04/10/18 1315 04/12/18 0542  WBC 8.3 7.5 6.8 9.0 10.5  HGB 10.0* 9.5* 8.8* 9.2* 9.8*  HCT 33.8* 33.1* 30.4* 31.6* 32.4*  MCV 90.6 91.4 91.6 92.1 89.5  PLT 350 319 283 306 415   Basic Metabolic Panel: Recent Labs  Lab 04/06/18 0436  04/07/18 0341 04/08/18 0903 04/10/18 1315 04/11/18 0638 04/12/18 0542  NA 136  --  140 142 141 139 138  K 6.2*   < > 4.2 4.2 4.1 4.5 4.5  CL 107  --  108 109 101 99 101  CO2 19*  --  24 25 27 24 25   GLUCOSE 254*  --  180* 182* 238* 185* 148*  BUN 37*  --  27* 24* 30* 34* 36*  CREATININE 1.86*  --  1.22* 1.16* 1.89* 2.07* 1.84*  CALCIUM 8.5*  --  8.3* 8.7* 8.3* 8.4* 8.1*  PHOS 4.3  --   --   --   --   --   --    < > = values in this interval not displayed.   GFR: Estimated Creatinine Clearance: 37.7 mL/min (A) (by C-G formula based on SCr of 1.84 mg/dL (H)). Liver Function Tests: Recent Labs  Lab 04/06/18 0436  ALBUMIN 2.9*   No results for input(s): LIPASE, AMYLASE in the last 168 hours. No results for input(s): AMMONIA in the last 168 hours. Coagulation Profile: No results for input(s): INR, PROTIME in the last 168 hours. Cardiac Enzymes: No results for input(s): CKTOTAL, CKMB, CKMBINDEX, TROPONINI in the last 168 hours. BNP (last 3 results) No results for input(s): PROBNP in the last 8760 hours. HbA1C: No results for input(s): HGBA1C in the last 72 hours. CBG: Recent Labs  Lab 04/11/18 1257 04/11/18 1643 04/11/18 2114 04/11/18 2155 04/12/18 0746  GLUCAP 137* 123* 66* 87 142*   Lipid Profile: No results for input(s): CHOL, HDL, LDLCALC, TRIG, CHOLHDL, LDLDIRECT in the last 72 hours. Thyroid Function Tests: No results for input(s): TSH, T4TOTAL, FREET4, T3FREE, THYROIDAB in the last 72 hours. Anemia Panel: No results for input(s): VITAMINB12, FOLATE, FERRITIN, TIBC, IRON, RETICCTPCT in the last 72 hours. Urine analysis:    Component Value Date/Time   COLORURINE AMBER (A)  04/04/2018 0401   APPEARANCEUR CLOUDY (A) 04/04/2018 0401   LABSPEC 1.017 04/04/2018 0401   PHURINE 6.0 04/04/2018 0401   GLUCOSEU NEGATIVE 04/04/2018 0401   HGBUR SMALL (A) 04/04/2018 0401   BILIRUBINUR NEGATIVE 04/04/2018 0401   KETONESUR NEGATIVE 04/04/2018 0401   PROTEINUR 100 (A) 04/04/2018 0401   UROBILINOGEN 0.2 12/01/2007 1025   NITRITE NEGATIVE 04/04/2018 0401   LEUKOCYTESUR LARGE (A) 04/04/2018 0401   Sepsis Labs: Invalid input(s): PROCALCITONIN, LACTICIDVEN  Recent Results (from the past 240 hour(s))  Culture, Urine     Status: Abnormal   Collection Time: 04/03/18  2:22 PM  Result Value Ref Range Status   Specimen Description URINE, RANDOM  Final   Special Requests   Final    NONE Performed at [facility]  Lab, [address] [place], [place] [address]    Culture >=100,000 COLONIES/mL KLEBSIELLA PNEUMONIAE (A)  Final   Report Status 04/06/2018 FINAL  Final   Organism ID, Bacteria KLEBSIELLA PNEUMONIAE (A)  Final      Susceptibility   Klebsiella pneumoniae - MIC*    AMPICILLIN RESISTANT Resistant     CEFAZOLIN <=4 SENSITIVE Sensitive     CEFTRIAXONE <=1 SENSITIVE Sensitive     CIPROFLOXACIN <=0.25 SENSITIVE Sensitive     GENTAMICIN <=1 SENSITIVE Sensitive     IMIPENEM <=0.25 SENSITIVE Sensitive     NITROFURANTOIN <=16 SENSITIVE Sensitive     TRIMETH/SULFA <=20 SENSITIVE Sensitive     AMPICILLIN/SULBACTAM <=2 SENSITIVE Sensitive     PIP/TAZO <=4 SENSITIVE Sensitive     Extended ESBL NEGATIVE Sensitive     * >=100,000 COLONIES/mL KLEBSIELLA PNEUMONIAE      Radiology Studies: Dg Chest 2 View  Result Date: 04/10/2018 CLINICAL DATA:  Recent diagnosis of atrial fibrillation. Chest pain and shortness of breath. EXAM: CHEST - 2 VIEW COMPARISON:  April 01, 2018 FINDINGS: Stable cardiomegaly. The hila and mediastinum are unchanged. No pneumothorax. No pulmonary nodules or masses. No focal infiltrates. No overt edema. IMPRESSION: Cardiomegaly without overt edema.  Electronically Signed   By: Dorise Bullion III M.D   On: 04/10/2018 14:00     Scheduled Meds: . apixaban  10 mg Oral BID  . buPROPion  300 mg Oral Daily  . fluticasone  2 spray Each Nare Daily  . furosemide  20 mg Intravenous Q12H  . insulin aspart  0-15 Units Subcutaneous TID WC  . loratadine  10 mg Oral Daily  . metoprolol tartrate  125 mg Oral BID  . tiotropium  18 mcg Inhalation Daily   Continuous Infusions:    Marzetta Board, MD, PhD [facility] Pager [phone]  If 7PM-7AM, please contact night-coverage [email or web address] Password [email or web address] 04/12/2018, 11:14 AM

## 2018-04-13 ENCOUNTER — Other Ambulatory Visit: Payer: Self-pay

## 2018-04-13 ENCOUNTER — Encounter (HOSPITAL_COMMUNITY): Payer: Self-pay | Admitting: Emergency Medicine

## 2018-04-13 ENCOUNTER — Emergency Department (HOSPITAL_COMMUNITY): Payer: BLUE CROSS/BLUE SHIELD

## 2018-04-13 ENCOUNTER — Inpatient Hospital Stay (HOSPITAL_COMMUNITY)
Admission: EM | Admit: 2018-04-13 | Discharge: 2018-04-27 | Disposition: A | Discharge status: Home / Self Care | Payer: BLUE CROSS/BLUE SHIELD | Source: Home / Self Care | Attending: Internal Medicine | Admitting: Internal Medicine

## 2018-04-13 DIAGNOSIS — I5032 Chronic diastolic (congestive) heart failure: Secondary | ICD-10-CM | POA: Diagnosis present

## 2018-04-13 DIAGNOSIS — E1165 Type 2 diabetes mellitus with hyperglycemia: Secondary | ICD-10-CM | POA: Diagnosis present

## 2018-04-13 DIAGNOSIS — N183 Chronic kidney disease, stage 3 unspecified: Secondary | ICD-10-CM | POA: Diagnosis present

## 2018-04-13 DIAGNOSIS — I4891 Unspecified atrial fibrillation: Secondary | ICD-10-CM

## 2018-04-13 DIAGNOSIS — S8254XA Nondisplaced fracture of medial malleolus of right tibia, initial encounter for closed fracture: Secondary | ICD-10-CM

## 2018-04-13 DIAGNOSIS — S82401A Unspecified fracture of shaft of right fibula, initial encounter for closed fracture: Secondary | ICD-10-CM | POA: Diagnosis present

## 2018-04-13 DIAGNOSIS — S82102A Unspecified fracture of upper end of left tibia, initial encounter for closed fracture: Secondary | ICD-10-CM

## 2018-04-13 DIAGNOSIS — R0902 Hypoxemia: Secondary | ICD-10-CM

## 2018-04-13 DIAGNOSIS — S82832A Other fracture of upper and lower end of left fibula, initial encounter for closed fracture: Secondary | ICD-10-CM

## 2018-04-13 DIAGNOSIS — I10 Essential (primary) hypertension: Secondary | ICD-10-CM | POA: Diagnosis present

## 2018-04-13 DIAGNOSIS — S82202A Unspecified fracture of shaft of left tibia, initial encounter for closed fracture: Secondary | ICD-10-CM | POA: Diagnosis present

## 2018-04-13 DIAGNOSIS — IMO0002 Reserved for concepts with insufficient information to code with codable children: Secondary | ICD-10-CM | POA: Diagnosis present

## 2018-04-13 DIAGNOSIS — S82839A Other fracture of upper and lower end of unspecified fibula, initial encounter for closed fracture: Secondary | ICD-10-CM

## 2018-04-13 LAB — GLUCOSE, CAPILLARY
GLUCOSE-CAPILLARY: 171 mg/dL — AB (ref 70–99)
Glucose-Capillary: 180 mg/dL — ABNORMAL HIGH (ref 70–99)

## 2018-04-13 LAB — BASIC METABOLIC PANEL
ANION GAP: 16 — AB (ref 5–15)
BUN: 41 mg/dL — ABNORMAL HIGH (ref 8–23)
CALCIUM: 8 mg/dL — AB (ref 8.9–10.3)
CHLORIDE: 98 mmol/L (ref 98–111)
CO2: 24 mmol/L (ref 22–32)
CREATININE: 1.86 mg/dL — AB (ref 0.44–1.00)
GFR calc Af Amer: 31 mL/min — ABNORMAL LOW (ref >60)
GFR calc non Af Amer: 27 mL/min — ABNORMAL LOW (ref >60)
GLUCOSE: 160 mg/dL — AB (ref 70–99)
Potassium: 4.6 mmol/L (ref 3.5–5.1)
Sodium: 138 mmol/L (ref 135–145)

## 2018-04-13 LAB — CBC
HCT: 30.2 % — ABNORMAL LOW (ref 36.0–46.0)
HEMOGLOBIN: 9.1 g/dL — AB (ref 12.0–15.0)
MCH: 27 pg (ref 26.0–34.0)
MCHC: 30.1 g/dL (ref 30.0–36.0)
MCV: 89.6 fL (ref 78.0–100.0)
Platelets: 265 10*3/uL (ref 150–400)
RBC: 3.37 MIL/uL — AB (ref 3.87–5.11)
RDW: 16.1 % — ABNORMAL HIGH (ref 11.5–15.5)
WBC: 9.3 10*3/uL (ref 4.0–10.5)

## 2018-04-13 MED ORDER — APIXABAN 5 MG PO TABS: 5.0000 mg | ORAL_TABLET | Freq: Two times a day (BID) | ORAL | Status: DC

## 2018-04-13 MED ORDER — MORPHINE SULFATE (PF) 4 MG/ML IV SOLN
4.0000 mg | Freq: Once | INTRAVENOUS | Status: AC
  Administered 2018-04-14: 4 mg via INTRAVENOUS
  Filled 2018-04-13: qty 1

## 2018-04-13 MED ORDER — METOPROLOL TARTRATE 75 MG PO TABS: 150.0000 mg | ORAL_TABLET | Freq: Two times a day (BID) | ORAL | 1 refills | Status: DC

## 2018-04-13 MED ORDER — METOPROLOL TARTRATE 50 MG PO TABS
150.0000 mg | ORAL_TABLET | Freq: Two times a day (BID) | ORAL | Status: DC
  Administered 2018-04-13: 150 mg via ORAL
  Filled 2018-04-13 (×2): qty 1

## 2018-04-13 MED ORDER — ONDANSETRON HCL 4 MG/2ML IJ SOLN
4.0000 mg | Freq: Once | INTRAMUSCULAR | Status: AC
  Administered 2018-04-14: 4 mg via INTRAVENOUS
  Filled 2018-04-13: qty 2

## 2018-04-13 NOTE — Progress Notes (Addendum)
Physical Therapy Treatment Patient Details Name: [name] MRN: [id number] DOB: Dec 25, 1950 Today's Date: 04/13/2018    History of Present Illness 67 yo female with recent admit 8/15-22 with A-fib with RVR and CHF exacerbation, found to have LV thrombus on 8/19. Admitted 8/22 with CHF exacerbation with uncontrolled A-fib and AKI. PMH DM, HTN, obesity, femoral IM nail L, hx wrist fracture surgery     PT Comments    Pt pleasant and willing to mobilize today. Pt reports she increased activity last evening with transfers to [facility] and WC. Pt at baseline has assist of granddaughter for laundry, does household activities from [facility] level and walks 15' as max distance with standard walker. Pt adamant that SNF is not an option but open to HHPT to increase function and strength. Pt with WC that is too small for her with 18" frame rubbing bil hips. Pt educated for need for wider chair and pt reports she had a 20" chair that did not fit through doors so she exchanged it. Pt educated for pressure relief, skin breakdown, getting out of chair at least every hour and home modifications of door jam removal to increase width for chair. Pt stated understanding of all and reports she will increase time out of WC but does not want to modify home or change WC size despite recommendation. Pt will continue to benefit from further therapy to increase endurance. HR 115-120 with activity    Follow Up Recommendations  Home health PT;Supervision/Assistance - 24 hour     Equipment Recommendations  None recommended by PT    Recommendations for Other Services       Precautions / Restrictions Precautions Precautions: Fall    Mobility  Bed Mobility Overal bed mobility: Needs Assistance Bed Mobility: Rolling;Sidelying to Sit Rolling: Modified independent (Device/Increase time) Sidelying to sit: Min assist       General bed mobility comments: rail to roll with assist to elevate trunk from  surface  Transfers Overall transfer level: Needs assistance   Transfers: Sit to/from Stand Sit to Stand: Min assist         General transfer comment: assist to rise from bed and WC, cues to lock brakes on WC on 2nd trial  Ambulation/Gait Ambulation/Gait assistance: Min guard Gait Distance (Feet): 14 Feet Assistive device: Standard walker Gait Pattern/deviations: Wide base of support;Trunk flexed;Step-through pattern;Decreased stride length   Gait velocity interpretation: <1.8 ft/sec, indicate of risk for recurrent falls General Gait Details: pt able to walk 8' then 14' with Standard walker with slow gait and WC follow   Theme park manager mobility: Yes Wheelchair propulsion: Both upper extremities;Both lower extermities Wheelchair parts: Supervision/cueing Distance: 30'  Wheelchair Assistance Details (indicate cue type and reason): pt able to manage brakes and manipulate [place] without assist with turning and room mobility, declined hall mobility. Did require cues to stand from [facility] to lock brakes first  Modified Rankin (Stroke Patients Only)       Balance Overall balance assessment: History of Falls;Mild deficits observed, not formally tested                                          Cognition Arousal/Alertness: Awake/alert Behavior During Therapy: WFL for tasks assessed/performed Overall Cognitive Status: Within Functional Limits for tasks assessed  Exercises      General Comments        Pertinent Vitals/Pain Pain Assessment: No/denies pain    Home Living                      Prior Function            PT Goals (current goals can now be found in the care plan section) Progress towards PT goals: Progressing toward goals    Frequency           PT Plan Current plan remains appropriate    Co-evaluation               AM-PAC PT "6 Clicks" Daily Activity  Outcome Measure  Difficulty turning over in bed (including adjusting bedclothes, sheets and blankets)?: Unable Difficulty moving from lying on back to sitting on the side of the bed? : Unable Difficulty sitting down on and standing up from a chair with arms (e.g., wheelchair, bedside commode, etc,.)?: A Little Help needed moving to and from a bed to chair (including a wheelchair)?: A Little Help needed walking in hospital room?: A Little Help needed climbing 3-5 steps with a railing? : Total 6 Click Score: 12    End of Session Equipment Utilized During Treatment: Gait belt Activity Tolerance: Patient tolerated treatment well Patient left: in chair;with call bell/phone within reach;with chair alarm set Nurse Communication: Mobility status PT Visit Diagnosis: Muscle weakness (generalized) (M62.81);History of falling (Z91.81);Other abnormalities of gait and mobility (R26.89)     Time: 8309-4076 PT Time Calculation (min) (ACUTE ONLY): 38 min  Charges:  $Gait Training: 8-22 mins $Therapeutic Activity: 8-22 mins $Self Care/Home Management: 8-22                     Elwyn Reach, [place]    Sandy Salaam Shay Bartoli 04/13/2018, 10:48 AM

## 2018-04-13 NOTE — Discharge Instructions (Signed)
Please continue Eliquis 10 mg twice daily for 4 days, then on 04/18/2018 start taking 5 mg twice daily  Please follow up with Cardiology as scheduled    Information on my medicine - ELIQUIS (apixaban)  Why was Eliquis prescribed for you? Eliquis was prescribed to treat blood clots that may have been found in the veins of your legs (deep vein thrombosis) or in your lungs (pulmonary embolism) and to reduce the risk of them occurring again.  What do You need to know about Eliquis ? The starting dose is 10 mg (two 5 mg tablets) taken TWICE daily then on April 18, 2018  the dose is reduced to ONE 5 mg tablet taken TWICE daily.  Eliquis may be taken with or without food.   Try to take the dose about the same time in the morning and in the evening. If you have difficulty swallowing the tablet whole please discuss with your pharmacist how to take the medication safely.  Take Eliquis exactly as prescribed and DO NOT stop taking Eliquis without talking to the doctor who prescribed the medication.  Stopping may increase your risk of developing a new blood clot.  Refill your prescription before you run out.  After discharge, you should have regular check-up appointments with your healthcare provider that is prescribing your Eliquis.    What do you do if you miss a dose? If a dose of ELIQUIS is not taken at the scheduled time, take it as soon as possible on the same day and twice-daily administration should be resumed. The dose should not be doubled to make up for a missed dose.  Important Safety Information A possible side effect of Eliquis is bleeding. You should call your healthcare provider right away if you experience any of the following: ? Bleeding from an injury or your nose that does not stop. ? Unusual colored urine (red or dark brown) or unusual colored stools (red or black). ? Unusual bruising for unknown reasons. ? A serious fall or if you hit your head (even if there is no  bleeding).  Some medicines may interact with Eliquis and might increase your risk of bleeding or clotting while on Eliquis. To help avoid this, consult your healthcare provider or pharmacist prior to using any new prescription or non-prescription medications, including herbals, vitamins, non-steroidal anti-inflammatory drugs (NSAIDs) and supplements.  This website has more information on Eliquis (apixaban): http://www.eliquis.com/eliquis/home

## 2018-04-13 NOTE — Care Management Note (Signed)
Case Management Note  Patient Details  Name: [name] MRN: [id number] Date of Birth: Jun 21, 1951  Subjective/Objective: CHF                  Action/Plan: Patient lives at home; PCP: [name], MD; has private insurance with BCBS with prescription drug coverage; patient is active with [facility] as prior to admission; Dan with [facility] called for resumption of services. CM will continue to follow for progression of care. Expected Discharge Date:  04/13/18               Expected Discharge Plan:  [place]  Discharge planning Services  CM Consult  HH Arranged:  RN, Disease Management, PT [facility] Agency:  [facility]  Status of Service:  In process, will continue to follow  Sherrilyn Rist [phone] 04/13/2018, 10:35 AM

## 2018-04-13 NOTE — Progress Notes (Signed)
To the best of my knowledge, documentation by Girtha Rm, [place] nursing student, is correct.

## 2018-04-13 NOTE — Discharge Summary (Signed)
Physician Discharge Summary  [name] IDP:[id number] DOB: 22-Jun-1951 DOA: 04/10/2018  PCP: [name], MD  Admit date: 04/10/2018 Discharge date: 04/13/2018  Admitted From: Home Disposition: Home, refusing SNF  Recommendations for Outpatient Follow-up:  1. Follow up with cardiology as scheduled  Home Health: PT Equipment/Devices: Wheelchair, hoyer lift, walker  Discharge Condition: Stable CODE STATUS: Full code Diet recommendation: Low-salt  HPI: Per Dr. Hettie Holstein [name] is a 67 y.o. female with history h/o HTN, type 2 diabetes newly diagnosed A. fib and systolic heart failurewho presented on 8/15/2019with worsening shortness of breath and worsening lower extremity swelling not responsive to oral Lasix started by her cardiologist as an outpatientand was found to haveA. fib with RVR and acute diastolic CHFexacerbation.She was admitted here and discharged on 8/22 with instructions to f/u cardiology as outpatient for possible DCCV after 4 weeks of anticoagulation with Eliquis which was started last admission , if she still remains in A.fib. She was, in fact, found to have LV thrombus on TEE on 04/05/2018, sono cardioversion was attempted. Per d/c summary hospital course was also complicated by AKI  with peak creatinine of 2.74 due to congested kidney, improved to  1.16 on discharge. Patient also recommended sleep study as outpatient. Pt is mostly wheelchair bound-at baseline, uses hoyer lift to transfer, refused [facility] PT last admission.  Patient states upon last discharge she was able to sleep with 2 pillows.  However over the last 48 hours at home her orthopnea has worsened and she has not been able to sleep at night.  She has been laying in a chair at night with leg end elevated. She also reports chest tightness this morning, she has been concerned about weight gain of 5 pounds since last discharge and worsening leg swellings which brought her back to the ED today.  In the ED she was noted to be slightly tachycardic with heart rate in 110s, BNP 1000, creatinine 1.89 and chest x-ray negative for pulmonary edema.  Patient received 60 mg of IV Lasix and requested to be admitted for further diuresis.  Per patient her dry weight is 247 pounds.  She denies any chest pain currently.  She denies any nausea, vomiting, diarrhea, fever, chills or palpitations at home.  She feels she is refractory to oral diuretics (was discharged on Lasix 40 mg daily).  Hospital Course: Acute on chronic diastolic CHF -patient was admitted to the hospital with fluid overload in the setting of dietary noncompliance as well as A. fib.  She does not follow a low-salt diet, add salt in the food that she is cooking as well as add salt when she eats.  She was extensively counseled to avoid high salt intake.  She was diuresed with IV Lasix, improved and she is euvolemic on discharge. Atrial fibrillation with RVR -initially poorly controlled rates on admission, her metoprolol dose was changed from 75 twice daily 250 twice daily, she tolerates this well, she is in the 90 at rest and 110-115 with PT.  She has follow-up with cardiology in less than 2 weeks for reevaluation regarding TEE and cardioversion. Left atrial thrombus -seen on the TEE during prior hospital stay when there was an attempt to do cardioversion.  She was placed Eliquis, discussed with cardiology and this is to be continued.  Unfortunately she was taking 5 twice daily instead of 10 twice daily after the last hospital stay, she was placed back on 10 twice daily and advised to take for a total of  7 days then transition to 5 twice daily. Type 2 diabetes mellitus -strongly counseled for dietary compliance, hold metformin due to chronic kidney disease Acute kidney injury and chronic kidney disease, likely stage III -patient was told by her PCP for quite some time that her kidneys are normal but does not know to what extent, suspect stage III.   Creatinine slightly above 2 on admission, with Lasix improved to 1.8 and has remained stable.  Continue Lasix on discharge, recommend BMP at the next follow-up appointment Hypertension-continue home medications Anemia-likely from chronic disease, stable Debility-she is mostly wheelchair-bound at home uses a [facility] lift transfer, she is recommending for SNF however she refuses.  Will maximize home health on discharge  Discharge Diagnoses:  Active Problems:   Atrial fibrillation with RVR (HCC)   CHF exacerbation (HCC)     Discharge Instructions   Allergies as of 04/13/2018      Reactions   Latex Itching, Rash      Medication List    STOP taking these medications   metFORMIN 500 MG 24 hr tablet Commonly known as:  GLUCOPHAGE-XR     TAKE these medications   acetaminophen 500 MG tablet Commonly known as:  TYLENOL Take 1,500-2,000 mg by mouth every 6 (six) hours as needed for mild pain or headache.   apixaban 5 MG Tabs tablet Commonly known as:  ELIQUIS Take 2 tablets (10 mg total) by mouth 2 (two) times daily for 4 days.   apixaban 5 MG Tabs tablet Commonly known as:  ELIQUIS Take 1 tablet (5 mg total) by mouth 2 (two) times daily.   buPROPion 300 MG 24 hr tablet Commonly known as:  WELLBUTRIN XL Take 300 mg by mouth daily.   cetirizine 10 MG tablet Commonly known as:  ZYRTEC Take 10 mg by mouth daily as needed for allergies.   fluticasone 50 MCG/ACT nasal spray Commonly known as:  FLONASE Place 2 sprays into both nostrils daily.   furosemide 40 MG tablet Commonly known as:  LASIX Take 1 tablet (40 mg total) by mouth daily.   glipiZIDE 10 MG tablet Commonly known as:  GLUCOTROL Take 10 mg by mouth 2 (two) times daily.   Metoprolol Tartrate 75 MG Tabs Take 150 mg by mouth 2 (two) times daily. What changed:  how much to take   SPIRIVA RESPIMAT 1.25 MCG/ACT Aers Generic drug:  Tiotropium Bromide Monohydrate Inhale 2 sprays into the lungs daily.       Follow-up Information    Buford Dresser, MD Follow up.   Specialty:  Cardiology Why:  as scheduled Contact information: [address] [place] [address] [phone]        [name], MD. Go on 04/26/2018.   Specialty:  Internal Medicine Why:  @10 :30am Contact information: [address] [place] [address] [place] Follow up.   Why:  They will continue to do your home health care at your home Contact information: [address] [place] [address] [phone]           Consultations:  None   Procedures/Studies:  Dg Chest 2 View  Result Date: 04/10/2018 CLINICAL DATA:  Recent diagnosis of atrial fibrillation. Chest pain and shortness of breath. EXAM: CHEST - 2 VIEW COMPARISON:  April 01, 2018 FINDINGS: Stable cardiomegaly. The hila and mediastinum are unchanged. No pneumothorax. No pulmonary nodules or masses. No focal infiltrates. No overt  edema. IMPRESSION: Cardiomegaly without overt edema. Electronically Signed   By: Dorise Bullion III M.D   On: 04/10/2018 14:00   Dg Chest 2 View  Result Date: 04/01/2018 CLINICAL DATA:  Shortness of breath EXAM: CHEST - 2 VIEW COMPARISON:  03/29/2018, 02/19/2016 FINDINGS: Mild cardiomegaly with vascular congestion and mild diffuse interstitial opacities suggesting minimal edema. No pleural effusion. Possible small anterior lower lobe infiltrate on the lateral view. Aortic atherosclerosis. No pneumothorax. IMPRESSION: 1. Cardiomegaly with vascular congestion and mild interstitial edema 2. Possible small anterior lung base infiltrate on the lateral view. Electronically Signed   By: Donavan Foil M.D.   On: 04/01/2018 18:48   Dg Chest 2 View  Result Date: 03/29/2018 CLINICAL DATA:  Acute shortness of breath with cough for 1 week. EXAM: CHEST - 2 VIEW COMPARISON:  02/19/2016 FINDINGS: Cardiomegaly and pulmonary vascular congestion  noted. There is no evidence of focal airspace disease,, pulmonary edema, suspicious pulmonary nodule/mass, pleural effusion, or pneumothorax. No acute bony abnormalities are identified. IMPRESSION: Cardiomegaly with pulmonary vascular congestion. Electronically Signed   By: Margarette Canada M.D.   On: 03/29/2018 14:16   US Renal  Result Date: 04/05/2018 CLINICAL DATA:  Acute renal injury EXAM: RENAL / URINARY TRACT ULTRASOUND COMPLETE COMPARISON:  None. FINDINGS: Right Kidney: Length: 8.9 cm. Echogenicity within normal limits. No mass or hydronephrosis visualized. Left Kidney: Length: 10.3 cm. Echogenicity within normal limits. No mass or hydronephrosis visualized. Bladder: Appears normal for degree of bladder distention. IMPRESSION: Difficult visualization of the kidneys due to habitus. No hydronephrosis. Electronically Signed   By: Donavan Foil M.D.   On: 04/05/2018 00:50      Subjective: - no chest pain, shortness of breath, no abdominal pain, nausea or vomiting.   Discharge Exam: Vitals:   04/13/18 0900 04/13/18 1028  BP: (!) 134/92   Pulse: (!) 115 (!) 120  Resp: 19   Temp: (!) 97.5 F (36.4 C)   SpO2: 98%     General: Pt is alert, awake, not in acute distress Cardiovascular: irregular Respiratory: CTA bilaterally, no wheezing, no rhonchi Abdominal: Soft, NT, ND, bowel sounds + Extremities: no edema, no cyanosis    The results of significant diagnostics from this hospitalization (including imaging, microbiology, ancillary and laboratory) are listed below for reference.     Microbiology: Recent Results (from the past 240 hour(s))  Culture, Urine     Status: Abnormal   Collection Time: 04/03/18  2:22 PM  Result Value Ref Range Status   Specimen Description URINE, RANDOM  Final   Special Requests   Final    NONE Performed at [facility], [address], [place], [place] [address]    Culture >=100,000 COLONIES/mL KLEBSIELLA PNEUMONIAE (A)  Final   Report Status  04/06/2018 FINAL  Final   Organism ID, Bacteria KLEBSIELLA PNEUMONIAE (A)  Final      Susceptibility   Klebsiella pneumoniae - MIC*    AMPICILLIN RESISTANT Resistant     CEFAZOLIN <=4 SENSITIVE Sensitive     CEFTRIAXONE <=1 SENSITIVE Sensitive     CIPROFLOXACIN <=0.25 SENSITIVE Sensitive     GENTAMICIN <=1 SENSITIVE Sensitive     IMIPENEM <=0.25 SENSITIVE Sensitive     NITROFURANTOIN <=16 SENSITIVE Sensitive     TRIMETH/SULFA <=20 SENSITIVE Sensitive     AMPICILLIN/SULBACTAM <=2 SENSITIVE Sensitive     PIP/TAZO <=4 SENSITIVE Sensitive     Extended ESBL NEGATIVE Sensitive     * >=100,000 COLONIES/mL KLEBSIELLA PNEUMONIAE     Labs:  BNP (last 3 results) Recent Labs    04/10/18 1315  BNP 4,388.8*   Basic Metabolic Panel: Recent Labs  Lab 04/08/18 0903 04/10/18 1315 04/11/18 0638 04/12/18 0542 04/13/18 0350  NA 142 141 139 138 138  K 4.2 4.1 4.5 4.5 4.6  CL 109 101 99 101 98  CO2 25 27 24 25 24   GLUCOSE 182* 238* 185* 148* 160*  BUN 24* 30* 34* 36* 41*  CREATININE 1.16* 1.89* 2.07* 1.84* 1.86*  CALCIUM 8.7* 8.3* 8.4* 8.1* 8.0*   Liver Function Tests: No results for input(s): AST, ALT, ALKPHOS, BILITOT, PROT, ALBUMIN in the last 168 hours. No results for input(s): LIPASE, AMYLASE in the last 168 hours. No results for input(s): AMMONIA in the last 168 hours. CBC: Recent Labs  Lab 04/07/18 0341 04/08/18 0415 04/10/18 1315 04/12/18 0542 04/13/18 0350  WBC 7.5 6.8 9.0 10.5 9.3  HGB 9.5* 8.8* 9.2* 9.8* 9.1*  HCT 33.1* 30.4* 31.6* 32.4* 30.2*  MCV 91.4 91.6 92.1 89.5 89.6  PLT 319 283 306 299 265   Cardiac Enzymes: No results for input(s): CKTOTAL, CKMB, CKMBINDEX, TROPONINI in the last 168 hours. BNP: Invalid input(s): POCBNP CBG: Recent Labs  Lab 04/12/18 1131 04/12/18 1600 04/12/18 2141 04/13/18 0740 04/13/18 1128  GLUCAP 126* 152* 142* 171* 180*   D-Dimer No results for input(s): DDIMER in the last 72 hours. Hgb A1c No results for input(s): HGBA1C  in the last 72 hours. Lipid Profile No results for input(s): CHOL, HDL, LDLCALC, TRIG, CHOLHDL, LDLDIRECT in the last 72 hours. Thyroid function studies No results for input(s): TSH, T4TOTAL, T3FREE, THYROIDAB in the last 72 hours.  Invalid input(s): FREET3 Anemia work up No results for input(s): VITAMINB12, FOLATE, FERRITIN, TIBC, IRON, RETICCTPCT in the last 72 hours. Urinalysis    Component Value Date/Time   COLORURINE AMBER (A) 04/04/2018 0401   APPEARANCEUR CLOUDY (A) 04/04/2018 0401   LABSPEC 1.017 04/04/2018 0401   PHURINE 6.0 04/04/2018 0401   GLUCOSEU NEGATIVE 04/04/2018 0401   HGBUR SMALL (A) 04/04/2018 0401   BILIRUBINUR NEGATIVE 04/04/2018 0401   KETONESUR NEGATIVE 04/04/2018 0401   PROTEINUR 100 (A) 04/04/2018 0401   UROBILINOGEN 0.2 12/01/2007 1025   NITRITE NEGATIVE 04/04/2018 0401   LEUKOCYTESUR LARGE (A) 04/04/2018 0401   Sepsis Labs Invalid input(s): PROCALCITONIN,  WBC,  LACTICIDVEN   Time coordinating discharge: 40 minutes  SIGNED:  Marzetta Board, MD  [facility] 04/13/2018, 2:14 PM Pager [phone]  If 7PM-7AM, please contact night-coverage [email or web address] Password [email or web address]

## 2018-04-13 NOTE — ED Triage Notes (Signed)
Pt arrives via EMS. Pt was D/C from [facility] today around 1200. Pts husband was bathing pt tonight and pt fell on to her to her left side. Pt C/O left leg pain "6-8 inches below my knee."

## 2018-04-13 NOTE — ED Provider Notes (Addendum)
[facility] EMERGENCY DEPARTMENT Provider Note   CSN: [id number] Arrival date & time: 04/13/18  2238     History   Chief Complaint Chief Complaint  Patient presents with  . Fall    HPI [name] is a 67 y.o. female.  Patient is a 67 year old female who presents to the emergency department by EMS because of leg pain following a fall.  The patient was discharged from the [facility] earlier today.  The patient's husband was attempting to help the patient with bathing.  The patient was trying to transfer from wheelchair to a lift chair.  She missed the lift chair, fell, and injured the left lower leg, and the right ankle area.  It is of note that the patient is on Eliquis.  The patient has a history of atrial fibrillation, congestive heart failure, diabetes mellitus, osteoporosis, and hypertension.  Patient request evaluation of this of pain, and assistance with her pain.  The history is provided by the patient and the spouse.    Past Medical History:  Diagnosis Date  . Depression   . Diabetes mellitus without complication ([place])   . Hypertension   . Morbid obesity ([place])   . Osteoporosis   . UTI (lower urinary tract infection) 01/2016   Cipro for Klebsiella pneumoniae isolate    Patient Active Problem List   Diagnosis Date Noted  . CHF exacerbation ([place]) 04/10/2018  . SOB (shortness of breath)   . Acute CHF (congestive heart failure) ([place]) 04/01/2018  . Atrial fibrillation with RVR ([place]) 04/01/2018  . [place] 03/11/2016  . Muscle spasms of lower extremity 03/04/2016  . Acute renal insufficiency 02/26/2016  . History of MRSA infection 02/26/2016  . Diabetes type 2, uncontrolled ([place]) 02/19/2016  . HTN (hypertension), benign 02/19/2016  . Fracture, femur, distal ([place]) 02/19/2016  . Fall 02/19/2016  . Depression 02/19/2016  . Closed left femoral fracture, initial encounter 02/19/2016  . Femur fracture, left ([place]) 02/19/2016    Past Surgical History:    Procedure Laterality Date  . ABDOMINAL HYSTERECTOMY    . CESAREAN SECTION    . CHOLECYSTECTOMY    . FEMUR IM NAIL Left 02/20/2016  . FEMUR IM NAIL Left 02/20/2016   Procedure: INTRAMEDULLARY (IM) RETROGRADE FEMORAL NAILING;  Surgeon: Leandrew Koyanagi, MD;  Location: [facility];  Service: Orthopedics;  Laterality: Left;  . PANCREAS SURGERY  1967   1 cyst excised and one cyst drained  . TEE WITHOUT CARDIOVERSION N/A 04/05/2018   Procedure: TRANSESOPHAGEAL ECHOCARDIOGRAM (TEE);  Surgeon: Dorothy Spark, MD;  Location: [facility];  Service: Cardiovascular;  Laterality: N/A;  . [place]    . WRIST FRACTURE SURGERY       OB History   None      Home Medications    Prior to Admission medications   Medication Sig Start Date End Date Taking? Authorizing Provider  acetaminophen (TYLENOL) 500 MG tablet Take 1,500-2,000 mg by mouth every 6 (six) hours as needed for mild pain or headache.   Yes [provider]  apixaban (ELIQUIS) 5 MG TABS tablet Take 2 tablets (10 mg total) by mouth 2 (two) times daily for 4 days. 04/08/18 04/13/18 Yes Oretha Milch D, MD  buPROPion (WELLBUTRIN XL) 300 MG 24 hr tablet Take 300 mg by mouth daily. 07/06/13  Yes [provider]  cetirizine (ZYRTEC) 10 MG tablet Take 10 mg by mouth daily as needed for allergies.  02/26/18  Yes [provider]  fluticasone (  FLONASE) 50 MCG/ACT nasal spray Place 2 sprays into both nostrils daily.   Yes [provider]  furosemide (LASIX) 40 MG tablet Take 1 tablet (40 mg total) by mouth daily. 04/08/18  Yes Oretha Milch D, MD  glipiZIDE (GLUCOTROL) 10 MG tablet Take 10 mg by mouth 2 (two) times daily. 03/02/18  Yes [provider]  metoprolol tartrate 75 MG TABS Take 150 mg by mouth 2 (two) times daily. 04/13/18 05/13/18 Yes Gherghe, Vella Redhead, MD  SPIRIVA RESPIMAT 1.25 MCG/ACT AERS Inhale 2 sprays into the lungs daily. 01/21/18  Yes [provider]  apixaban (ELIQUIS) 5 MG  TABS tablet Take 1 tablet (5 mg total) by mouth 2 (two) times daily. 04/13/18   Desiree Hane, MD    Family History Family History  Problem Relation Age of Onset  . Diabetes Mother        died in her 71's of a stroke  . Cancer Mother   . Stroke Mother   . Breast cancer Mother   . Diabetes Father        died in his 68's of a stroke.  . Stroke Father   . Diabetes Brother        died @ 45 of a stroke.  . Stroke Brother   . Diabetes Maternal Grandmother   . Diabetes Maternal Grandfather   . Diabetes Paternal Grandmother   . Diabetes Paternal Grandfather   . Breast cancer Maternal Aunt     Social History Social History   Tobacco Use  . Smoking status: Never Smoker  . Smokeless tobacco: Never Used  Substance Use Topics  . Alcohol use: No  . Drug use: No     Allergies   Latex   Review of Systems Review of Systems  Constitutional: Negative for activity change.       All ROS Neg except as noted in HPI  HENT: Negative for nosebleeds.   Eyes: Negative for photophobia and discharge.  Respiratory: Positive for shortness of breath. Negative for cough and wheezing.   Cardiovascular: Positive for palpitations and leg swelling. Negative for chest pain.  Gastrointestinal: Negative for abdominal pain and blood in stool.  Genitourinary: Negative for dysuria, frequency and hematuria.  Musculoskeletal: Positive for arthralgias. Negative for back pain and neck pain.  Skin: Negative.   Neurological: Negative for dizziness, seizures and speech difficulty.  Psychiatric/Behavioral: Negative for confusion and hallucinations.     Physical Exam Updated Vital Signs BP 137/90 (BP Location: Left Arm)   Pulse (!) 109   Temp 97.6 F (36.4 C) (Oral)   Resp 20   SpO2 95%   Physical Exam  HENT:  Head atraumatic.  No palpable hematomas or bruising.  Cardiovascular: Exam reveals no friction rub.  Patient has a tachycardia of 109.  There is an irregularly irregular rhythm.  No rub  appreciated.  No gallop noted.  Musculoskeletal:  There is no pain to movement of the pelvis.  There is no pain to palpation over the left hip area.  There is no deformity of the left thigh.  There is swelling of the left knee.  There is pain to palpation just below the anterior tibial tuberosity.  The swelling extends to the mid anterior tibial area.  The dorsalis pedis pulse is 2+.  There is good movement of the toes.  The Achilles tendon is intact.  There is good range of motion of the right hip, and knee.  There is pain with attempted range of motion  of the right ankle.  There is mild to moderate swelling of the ankle.  Pain is worse at the medial malleolus.  The dorsalis pedis pulses 2+.  Patient has good movement of the toes.     ED Treatments / Results  Labs (all labs ordered are listed, but only abnormal results are displayed) Labs Reviewed - No data to display  EKG None  Radiology No results found.  Procedures Procedures (including critical care time) FRACTURE CARE LEFT TIBIA/FIBULA Patient had a fall earlier today, and sustained a fracture to the proximal tibia and fibula on the left.  I discussed fracture with the patient in terms of which she understands, as well as her significant other.  I have also discussed with them the importance of immobilization.  They are in agreement and gave permission for the procedure.  Patient identified by armband.  Procedural timeout taken.  Patient fitted with a knee immobilizer.  After application of the knee immobilizer, the dorsalis pedis pulses 2+.  The capillary refill is less than 3 seconds.  The patient does not complain of it being too tight.  The nursing staff have been asked to check the neurologic status every hour, as the patient has fractures in the presence of being on Eliquis.  Pain medication provided.  Patient tolerated the procedure without problem.  FRACTURE CARE RIGHT FIBULA AND MEDIAL MALLEOLUS Patient sustained a fracture  earlier today during a fall at home.  The patient was transferring from a chair to a lift chair, fell and sustained fracture of the fibula, and the medial malleolus (bimalleolar fracture).  I discussed the fracture with the patient and the patient's family in terms which they understand.  I discussed with him the need for mobilization.  They give permission for the procedure.  Patient identified by armband.  Procedural timeout taken.  The patient was fitted with a posterior short leg splint.  After application of the splint, the capillary refill is less than 3 seconds.  The patient denies any sensation of it being too tight.  There are no temperature changes of the lower extremity.  The extremity was elevated, and ice was provided.  Pain medication was also provided for the patient.  Patient tolerated the procedure without problem. Medications Ordered in ED Medications  morphine 4 MG/ML injection 4 mg (has no administration in time range)  ondansetron (ZOFRAN) injection 4 mg (has no administration in time range)     Initial Impression / Assessment and Plan / ED Course  I have reviewed the triage vital signs and the nursing notes.  Pertinent labs & imaging results that were available during my care of the patient were reviewed by me and considered in my medical decision making (see chart for details).    Okay in room 96 67 year old patient who discharge her home a distal fibula metaphyseal fracture and a medial malleolus fracture on the right but on the left she has a significant amount of swelling  Final Clinical Impressions(s) / ED Diagnoses MDM  Vital signs reviewed.  Pulse oximetry is 95% on room air.  Within normal limits by my interpretation.  Patient having a great deal of pain.  IV morphine given to the patient for her pain.  Patient returns from x-ray.  Patient states she is still having a great deal of pain.  X-ray of the left tibia and fibula shows a minimally displaced proximal  left tibial fracture and fibula fracture. X-ray of the hip and left pelvis shows the hardware  to be in place.  There is degenerative disease changes present.  No fracture and no dislocation noted.  X-ray of the right ankle shows fractures of the distal fibular metaphysis and the base of the medial malleolus.  The ankle mortise is intact. Case discussed with Dr. Kathrynn Humble I discussed the fractures with the patient and family in terms of which they understand.  They are in agreement for admission.  Patient given IV Dilaudid 0.5 mg for assistance with her pain.  Case discussed with the [facility] hospital[facility].  Call placed to Dr. Aline Brochure for orthopedic consultation. Case discussed with Dr Aline Brochure. He will consult on this patient.   Final diagnoses:  Closed fracture of proximal end of left tibia, unspecified fracture morphology, initial encounter  Other closed fracture of proximal end of left fibula, initial encounter  Closed nondisplaced fracture of medial malleolus of right tibia, initial encounter  Closed fracture of distal end of fibula, unspecified fracture morphology, initial encounter    ED Discharge Orders    None       Lily Kocher, PA-C 04/14/18 0208    Lily Kocher, PA-C 04/14/18 [place], Ankit, MD 04/19/18 [phone]

## 2018-04-14 ENCOUNTER — Encounter (HOSPITAL_COMMUNITY): Payer: Self-pay | Admitting: *Deleted

## 2018-04-14 ENCOUNTER — Inpatient Hospital Stay (HOSPITAL_COMMUNITY): Payer: BLUE CROSS/BLUE SHIELD

## 2018-04-14 DIAGNOSIS — S82232A Displaced oblique fracture of shaft of left tibia, initial encounter for closed fracture: Secondary | ICD-10-CM

## 2018-04-14 DIAGNOSIS — S82202A Unspecified fracture of shaft of left tibia, initial encounter for closed fracture: Secondary | ICD-10-CM | POA: Diagnosis present

## 2018-04-14 DIAGNOSIS — S82832A Other fracture of upper and lower end of left fibula, initial encounter for closed fracture: Secondary | ICD-10-CM

## 2018-04-14 DIAGNOSIS — S82152A Displaced fracture of left tibial tuberosity, initial encounter for closed fracture: Secondary | ICD-10-CM

## 2018-04-14 DIAGNOSIS — S82839A Other fracture of upper and lower end of unspecified fibula, initial encounter for closed fracture: Secondary | ICD-10-CM

## 2018-04-14 DIAGNOSIS — S82841A Displaced bimalleolar fracture of right lower leg, initial encounter for closed fracture: Secondary | ICD-10-CM

## 2018-04-14 LAB — CBC
HCT: 31.7 % — ABNORMAL LOW (ref 36.0–46.0)
Hemoglobin: 9.3 g/dL — ABNORMAL LOW (ref 12.0–15.0)
MCH: 26.6 pg (ref 26.0–34.0)
MCHC: 29.3 g/dL — ABNORMAL LOW (ref 30.0–36.0)
MCV: 90.8 fL (ref 78.0–100.0)
Platelets: 305 10*3/uL (ref 150–400)
RBC: 3.49 MIL/uL — AB (ref 3.87–5.11)
RDW: 16.4 % — AB (ref 11.5–15.5)
WBC: 12.3 10*3/uL — ABNORMAL HIGH (ref 4.0–10.5)

## 2018-04-14 LAB — COMPREHENSIVE METABOLIC PANEL
ALT: 46 U/L — AB (ref 0–44)
ALT: 50 U/L — AB (ref 0–44)
AST: 57 U/L — ABNORMAL HIGH (ref 15–41)
AST: 59 U/L — ABNORMAL HIGH (ref 15–41)
Albumin: 3.1 g/dL — ABNORMAL LOW (ref 3.5–5.0)
Albumin: 3.1 g/dL — ABNORMAL LOW (ref 3.5–5.0)
Alkaline Phosphatase: 115 U/L (ref 38–126)
Alkaline Phosphatase: 112 U/L (ref 38–126)
Anion gap: 13 (ref 5–15)
Anion gap: 16 — ABNORMAL HIGH (ref 5–15)
BUN: 42 mg/dL — ABNORMAL HIGH (ref 8–23)
BUN: 41 mg/dL — ABNORMAL HIGH (ref 8–23)
CHLORIDE: 101 mmol/L (ref 98–111)
CHLORIDE: 100 mmol/L (ref 98–111)
CO2: 27 mmol/L (ref 22–32)
CO2: 24 mmol/L (ref 22–32)
Calcium: 8 mg/dL — ABNORMAL LOW (ref 8.9–10.3)
Calcium: 7.9 mg/dL — ABNORMAL LOW (ref 8.9–10.3)
Creatinine, Ser: 1.87 mg/dL — ABNORMAL HIGH (ref 0.44–1.00)
Creatinine, Ser: 1.86 mg/dL — ABNORMAL HIGH (ref 0.44–1.00)
GFR calc Af Amer: 31 mL/min — ABNORMAL LOW (ref >60)
GFR, EST AFRICAN AMERICAN: 31 mL/min — AB (ref >60)
GFR, EST NON AFRICAN AMERICAN: 27 mL/min — AB (ref >60)
GFR, EST NON AFRICAN AMERICAN: 27 mL/min — AB (ref >60)
GLUCOSE: 221 mg/dL — AB (ref 70–99)
Glucose, Bld: 222 mg/dL — ABNORMAL HIGH (ref 70–99)
Potassium: 4.2 mmol/L (ref 3.5–5.1)
Potassium: 4.2 mmol/L (ref 3.5–5.1)
SODIUM: 140 mmol/L (ref 135–145)
Sodium: 141 mmol/L (ref 135–145)
Total Bilirubin: 1.4 mg/dL — ABNORMAL HIGH (ref 0.3–1.2)
Total Bilirubin: 1.4 mg/dL — ABNORMAL HIGH (ref 0.3–1.2)
Total Protein: 6.7 g/dL (ref 6.5–8.1)
Total Protein: 6.6 g/dL (ref 6.5–8.1)

## 2018-04-14 LAB — GLUCOSE, CAPILLARY
GLUCOSE-CAPILLARY: 212 mg/dL — AB (ref 70–99)
GLUCOSE-CAPILLARY: 223 mg/dL — AB (ref 70–99)
GLUCOSE-CAPILLARY: 195 mg/dL — AB (ref 70–99)
Glucose-Capillary: 135 mg/dL — ABNORMAL HIGH (ref 70–99)
Glucose-Capillary: 103 mg/dL — ABNORMAL HIGH (ref 70–99)

## 2018-04-14 LAB — HEMOGLOBIN A1C
HEMOGLOBIN A1C: 8.3 % — AB (ref 4.8–5.6)
Mean Plasma Glucose: 191.51 mg/dL

## 2018-04-14 LAB — CBC WITH DIFFERENTIAL/PLATELET
BASOS ABS: 0 10*3/uL (ref 0.0–0.1)
Basophils Relative: 0 %
Eosinophils Absolute: 0.3 10*3/uL (ref 0.0–0.7)
Eosinophils Relative: 2 %
HCT: 31.1 % — ABNORMAL LOW (ref 36.0–46.0)
HEMOGLOBIN: 9.1 g/dL — AB (ref 12.0–15.0)
LYMPHS ABS: 1.3 10*3/uL (ref 0.7–4.0)
Lymphocytes Relative: 11 %
MCH: 26.3 pg (ref 26.0–34.0)
MCHC: 29.3 g/dL — ABNORMAL LOW (ref 30.0–36.0)
MCV: 89.9 fL (ref 78.0–100.0)
MONO ABS: 0.8 10*3/uL (ref 0.1–1.0)
Monocytes Relative: 8 %
Neutro Abs: 8.7 10*3/uL — ABNORMAL HIGH (ref 1.7–7.7)
Neutrophils Relative %: 79 %
Platelets: 338 10*3/uL (ref 150–400)
RBC: 3.46 MIL/uL — ABNORMAL LOW (ref 3.87–5.11)
RDW: 16.3 % — ABNORMAL HIGH (ref 11.5–15.5)
WBC: 11.1 10*3/uL — AB (ref 4.0–10.5)

## 2018-04-14 MED ORDER — APIXABAN 5 MG PO TABS: 10.0000 mg | ORAL_TABLET | Freq: Two times a day (BID) | ORAL | Status: DC

## 2018-04-14 MED ORDER — SODIUM CHLORIDE 0.9% FLUSH
3.0000 mL | INTRAVENOUS | Status: DC | PRN
  Administered 2018-04-15: 3 mL via INTRAVENOUS
  Filled 2018-04-14: qty 3

## 2018-04-14 MED ORDER — INSULIN ASPART 100 UNIT/ML ~~LOC~~ SOLN
0.0000 [IU] | Freq: Three times a day (TID) | SUBCUTANEOUS | Status: DC
  Administered 2018-04-14: 2 [IU] via SUBCUTANEOUS
  Administered 2018-04-14: 1 [IU] via SUBCUTANEOUS
  Administered 2018-04-14 – 2018-04-17 (×3): 3 [IU] via SUBCUTANEOUS
  Administered 2018-04-18 (×2): 2 [IU] via SUBCUTANEOUS
  Administered 2018-04-19: 3 [IU] via SUBCUTANEOUS
  Administered 2018-04-19 (×2): 2 [IU] via SUBCUTANEOUS
  Administered 2018-04-20: 3 [IU] via SUBCUTANEOUS
  Administered 2018-04-21 (×2): 2 [IU] via SUBCUTANEOUS
  Administered 2018-04-21: 1 [IU] via SUBCUTANEOUS
  Administered 2018-04-22 (×2): 3 [IU] via SUBCUTANEOUS
  Administered 2018-04-23: 2 [IU] via SUBCUTANEOUS
  Administered 2018-04-23: 3 [IU] via SUBCUTANEOUS
  Administered 2018-04-23 – 2018-04-24 (×2): 2 [IU] via SUBCUTANEOUS
  Administered 2018-04-24: 1 [IU] via SUBCUTANEOUS
  Administered 2018-04-24: 3 [IU] via SUBCUTANEOUS
  Administered 2018-04-25: 2 [IU] via SUBCUTANEOUS
  Administered 2018-04-25: 3 [IU] via SUBCUTANEOUS
  Administered 2018-04-25 – 2018-04-26 (×2): 2 [IU] via SUBCUTANEOUS
  Administered 2018-04-26: 3 [IU] via SUBCUTANEOUS
  Administered 2018-04-26 – 2018-04-27 (×3): 2 [IU] via SUBCUTANEOUS

## 2018-04-14 MED ORDER — METOPROLOL TARTRATE 50 MG PO TABS
150.0000 mg | ORAL_TABLET | Freq: Two times a day (BID) | ORAL | Status: DC
  Administered 2018-04-14 – 2018-04-27 (×27): 150 mg via ORAL
  Filled 2018-04-14 (×29): qty 3

## 2018-04-14 MED ORDER — SODIUM CHLORIDE 0.9% FLUSH
3.0000 mL | Freq: Two times a day (BID) | INTRAVENOUS | Status: DC
  Administered 2018-04-14 – 2018-04-27 (×26): 3 mL via INTRAVENOUS

## 2018-04-14 MED ORDER — FUROSEMIDE 40 MG PO TABS
40.0000 mg | ORAL_TABLET | Freq: Every day | ORAL | Status: DC
  Administered 2018-04-14 – 2018-04-27 (×14): 40 mg via ORAL
  Filled 2018-04-14 (×15): qty 1

## 2018-04-14 MED ORDER — HYDROMORPHONE HCL 1 MG/ML IJ SOLN
0.5000 mg | Freq: Once | INTRAMUSCULAR | Status: AC
  Administered 2018-04-14: 0.5 mg via INTRAVENOUS
  Filled 2018-04-14: qty 1

## 2018-04-14 MED ORDER — APIXABAN 5 MG PO TABS
5.0000 mg | ORAL_TABLET | Freq: Two times a day (BID) | ORAL | Status: DC
  Administered 2018-04-18 – 2018-04-27 (×18): 5 mg via ORAL
  Filled 2018-04-14 (×18): qty 1

## 2018-04-14 MED ORDER — ONDANSETRON HCL 4 MG/2ML IJ SOLN
4.0000 mg | Freq: Four times a day (QID) | INTRAMUSCULAR | Status: DC
  Administered 2018-04-14 – 2018-04-25 (×11): 4 mg via INTRAVENOUS
  Filled 2018-04-14 (×17): qty 2

## 2018-04-14 MED ORDER — INSULIN ASPART 100 UNIT/ML ~~LOC~~ SOLN
3.0000 [IU] | Freq: Three times a day (TID) | SUBCUTANEOUS | Status: DC
  Administered 2018-04-14 – 2018-04-19 (×13): 3 [IU] via SUBCUTANEOUS

## 2018-04-14 MED ORDER — ONDANSETRON HCL 4 MG/2ML IJ SOLN
4.0000 mg | Freq: Once | INTRAMUSCULAR | Status: AC
  Administered 2018-04-14: 4 mg via INTRAVENOUS
  Filled 2018-04-14: qty 2

## 2018-04-14 MED ORDER — SODIUM CHLORIDE 0.9 % IV SOLN: 250.0000 mL | INTRAVENOUS | Status: DC | PRN

## 2018-04-14 MED ORDER — BUPROPION HCL ER (XL) 300 MG PO TB24
300.0000 mg | ORAL_TABLET | Freq: Every day | ORAL | Status: DC
  Administered 2018-04-14 – 2018-04-27 (×14): 300 mg via ORAL
  Filled 2018-04-14 (×14): qty 1

## 2018-04-14 MED ORDER — ONDANSETRON HCL 4 MG/2ML IJ SOLN: 4.0000 mg | Freq: Four times a day (QID) | INTRAMUSCULAR | Status: DC | PRN

## 2018-04-14 MED ORDER — HYDROMORPHONE HCL 1 MG/ML IJ SOLN
1.0000 mg | INTRAMUSCULAR | Status: DC | PRN
  Administered 2018-04-14 – 2018-04-16 (×4): 1 mg via INTRAVENOUS
  Filled 2018-04-14 (×5): qty 1

## 2018-04-14 MED ORDER — APIXABAN 5 MG PO TABS
10.0000 mg | ORAL_TABLET | Freq: Two times a day (BID) | ORAL | Status: AC
  Administered 2018-04-14 – 2018-04-18 (×8): 10 mg via ORAL
  Filled 2018-04-14 (×8): qty 2

## 2018-04-14 NOTE — Consult Note (Addendum)
Consult / ORTHOPAEDICS  Chief Complaint  Patient presents with  . Fall    REQUESTED BY: Dr. Irwin Brakeman  [name]  is a 67 y.o. female, with history of chronic diastolic CHF, atrial fibrillation on anticoagulation with Eliquis, left atrial thrombus, type 2 diabetes mellitus, chronic kidney disease stage III hypertension, anemia who was just discharged from the hospital on 04/13/2018.  Patient at home was trying to transfer herself from wheelchair to left ear and she missed the left.  And injured left lower leg and right ankle.  Patient was brought to hospital.  She denies passing out.  No dizziness or blurred vision.  No chest pain or shortness of breath.  No nausea vomiting or diarrhea.  No abdominal pain.  No dysuria, urgency or frequency of urination. In the ED, x-ray of the left lower extremity showed minimally displaced proximal left tibial and fibular fractures.  X-ray of right ankle showed distal fibular metaphyseal and medial malleolar fractures. Orthopedic surgery was consulted by ED.  Dr. Aline Brochure to see patient in a.m.   History as above patient confirms history she was basically at her home after being discharged from the hospital yesterday fell using a lift device and fractured her left proximal tibia and right ankle with bimalleolar closed fracture  She is rather comfortable in bed at this time she did have some severe pain at the time of injury best described as dull throbbing moderate to severe with one days duration at this point worse with movement relieved by rest and splinting  ROS   Family History  Problem Relation Age of Onset  . Diabetes Mother        died in her 39's of a stroke  . Cancer Mother   . Stroke Mother   . Breast cancer Mother   . Diabetes Father        died in his 94's of a stroke.  . Stroke Father   . Diabetes Brother        died @ 79 of a stroke.  . Stroke Brother   . Diabetes Maternal Grandmother   . Diabetes Maternal Grandfather    . Diabetes Paternal Grandmother   . Diabetes Paternal Grandfather   . Breast cancer Maternal Aunt    Social History   Tobacco Use  . Smoking status: Never Smoker  . Smokeless tobacco: Never Used  Substance Use Topics  . Alcohol use: No  . Drug use: No    Past Medical History:  Diagnosis Date  . Depression   . Diabetes mellitus without complication ([place])   . Hypertension   . Morbid obesity ([place])   . Osteoporosis   . UTI (lower urinary tract infection) 01/2016   Cipro for Klebsiella pneumoniae isolate   Past Surgical History:  Procedure Laterality Date  . ABDOMINAL HYSTERECTOMY    . CESAREAN SECTION    . CHOLECYSTECTOMY    . FEMUR IM NAIL Left 02/20/2016  . FEMUR IM NAIL Left 02/20/2016   Procedure: INTRAMEDULLARY (IM) RETROGRADE FEMORAL NAILING;  Surgeon: Leandrew Koyanagi, MD;  Location: [place];  Service: Orthopedics;  Laterality: Left;  . PANCREAS SURGERY  1967   1 cyst excised and one cyst drained  . TEE WITHOUT CARDIOVERSION N/A 04/05/2018   Procedure: TRANSESOPHAGEAL ECHOCARDIOGRAM (TEE);  Surgeon: Dorothy Spark, MD;  Location: [facility];  Service: Cardiovascular;  Laterality: N/A;  . [place]    . WRIST FRACTURE SURGERY     Physical Exam  Constitutional:  She is oriented to person, place, and time. She appears well-developed.  Appearance normal with morbid obesity  Musculoskeletal:  Gait unable to walk  Neurological: She is alert and oriented to person, place, and time.  Psychiatric: She has a normal mood and affect.    Ortho Exam  Right ankle tenderness to palpation even through the splint no range of motion detected because of splint stability normal by x-ray muscle tone normal skin normal pulses normal sensation normal  Left proximal tibia motion at fracture tenderness at fracture swelling at fracture site knee could not be tested for stability ankle stable muscle tone normal skin intact compartment soft pulses good sensation  normal IMAGING  ASSESSMENT 67 year old female with multiple medical problems who is in no condition to have any surgery unless as it is in the emergency has an ankle fracture and a proximal tibial shaft fracture will be treated with immobilization  PLAN  Skin checks for the left proximal tibia fracture with immobilization within the immobilizer  Right ankle bimalleolar fracture can be treated with a Cam walker  Patient will be nonweightbearing lower extremities

## 2018-04-14 NOTE — Progress Notes (Signed)
04/14/2018 5:14 PM  CXR reviewed, no acute findings.  Murvin Natal MD

## 2018-04-14 NOTE — H&P (Signed)
TRH H&P    Patient Demographics:    [name], is a 67 y.o. female  MRN: [id number]  DOB - 18-Apr-1951  Admit Date - 04/13/2018  Referring MD/NP/PA: Lily Kocher  Outpatient Primary MD for the patient is [name], MD  Patient coming from: Home  Chief complaint-fall   HPI:    [name]  is a 67 y.o. female, with history of chronic diastolic CHF, atrial fibrillation on anticoagulation with Eliquis, left atrial thrombus, type 2 diabetes mellitus, chronic kidney disease stage III hypertension, anemia who was just discharged from the hospital on 04/13/2018.  Patient at home was trying to transfer herself from wheelchair to left ear and she missed the left.  And injured left lower leg and right ankle.  Patient was brought to hospital.  She denies passing out.  No dizziness or blurred vision.  No chest pain or shortness of breath.  No nausea vomiting or diarrhea.  No abdominal pain.  No dysuria, urgency or frequency of urination. In the ED, x-ray of the left lower extremity showed minimally displaced proximal left tibial and fibular fractures.  X-ray of right ankle showed distal fibular metaphyseal and medial malleolar fractures. Orthopedic surgery was consulted by ED.  Dr. Aline Brochure to see patient in a.m.   Review of systems:      All other systems reviewed and are negative.   With Past History of the following :    Past Medical History:  Diagnosis Date  . Depression   . Diabetes mellitus without complication ([place])   . Hypertension   . Morbid obesity ([place])   . Osteoporosis   . UTI (lower urinary tract infection) 01/2016   Cipro for Klebsiella pneumoniae isolate      Past Surgical History:  Procedure Laterality Date  . ABDOMINAL HYSTERECTOMY    . CESAREAN SECTION    . CHOLECYSTECTOMY    . FEMUR IM NAIL Left 02/20/2016  . FEMUR IM NAIL Left 02/20/2016   Procedure: INTRAMEDULLARY (IM)  RETROGRADE FEMORAL NAILING;  Surgeon: Leandrew Koyanagi, MD;  Location: [facility];  Service: Orthopedics;  Laterality: Left;  . PANCREAS SURGERY  1967   1 cyst excised and one cyst drained  . TEE WITHOUT CARDIOVERSION N/A 04/05/2018   Procedure: TRANSESOPHAGEAL ECHOCARDIOGRAM (TEE);  Surgeon: Dorothy Spark, MD;  Location: [facility];  Service: Cardiovascular;  Laterality: N/A;  . [place]    . WRIST FRACTURE SURGERY        Social History:      Social History   Tobacco Use  . Smoking status: Never Smoker  . Smokeless tobacco: Never Used  Substance Use Topics  . Alcohol use: No       Family History :     Family History  Problem Relation Age of Onset  . Diabetes Mother        died in her 16's of a stroke  . Cancer Mother   . Stroke Mother   . Breast cancer Mother   . Diabetes Father  died in his 68's of a stroke.  . Stroke Father   . Diabetes Brother        died @ 17 of a stroke.  . Stroke Brother   . Diabetes Maternal Grandmother   . Diabetes Maternal Grandfather   . Diabetes Paternal Grandmother   . Diabetes Paternal Grandfather   . Breast cancer Maternal Aunt       Home Medications:   Prior to Admission medications   Medication Sig Start Date End Date Taking? Authorizing Provider  acetaminophen (TYLENOL) 500 MG tablet Take 1,500-2,000 mg by mouth every 6 (six) hours as needed for mild pain or headache.   Yes [provider]  apixaban (ELIQUIS) 5 MG TABS tablet Take 2 tablets (10 mg total) by mouth 2 (two) times daily for 4 days. 04/08/18 04/13/18 Yes Oretha Milch D, MD  buPROPion (WELLBUTRIN XL) 300 MG 24 hr tablet Take 300 mg by mouth daily. 07/06/13  Yes [provider]  cetirizine (ZYRTEC) 10 MG tablet Take 10 mg by mouth daily as needed for allergies.  02/26/18  Yes [provider]  fluticasone (FLONASE) 50 MCG/ACT nasal spray Place 2 sprays into both nostrils daily.   Yes [provider]  furosemide  (LASIX) 40 MG tablet Take 1 tablet (40 mg total) by mouth daily. 04/08/18  Yes Oretha Milch D, MD  glipiZIDE (GLUCOTROL) 10 MG tablet Take 10 mg by mouth 2 (two) times daily. 03/02/18  Yes [provider]  metoprolol tartrate 75 MG TABS Take 150 mg by mouth 2 (two) times daily. 04/13/18 05/13/18 Yes Gherghe, Vella Redhead, MD  SPIRIVA RESPIMAT 1.25 MCG/ACT AERS Inhale 2 sprays into the lungs daily. 01/21/18  Yes [provider]  apixaban (ELIQUIS) 5 MG TABS tablet Take 1 tablet (5 mg total) by mouth 2 (two) times daily. 04/13/18   Desiree Hane, MD     Allergies:     Allergies  Allergen Reactions  . Latex Itching and Rash     Physical Exam:   Vitals  Blood pressure (!) 121/59, pulse (!) 103, temperature 97.6 F (36.4 C), temperature source Oral, resp. rate 14, SpO2 92 %.  1.  General: Appears in mild pain  2. Psychiatric:  Intact judgement and  insight, awake alert, oriented x 3.  3. Neurologic: No focal neurological deficits, all cranial nerves intact.Strength 5/5 all 4 extremities, sensation intact all 4 extremities, plantars down going.  4. Eyes :  anicteric sclerae, moist conjunctivae with no lid lag. PERRLA.  5. ENMT:  Oropharynx clear with moist mucous membranes and good dentition  6. Neck:  supple, no cervical lymphadenopathy appriciated, No thyromegaly  7. Respiratory : Normal respiratory effort, good air movement bilaterally,clear to  auscultation bilaterally  8. Cardiovascular : RRR, no gallops, rubs or murmurs, no leg edema  9. Gastrointestinal:  Positive bowel sounds, abdomen soft, non-tender to palpation,no hepatosplenomegaly, no rigidity or guarding       10. Skin:  No cyanosis, normal texture and turgor, no rash, lesions or ulcers  11.Musculoskeletal:  Significant swelling noted in the left knee and right ankle    Data Review:    CBC Recent Labs  Lab 04/08/18 0415 04/10/18 1315 04/12/18 0542 04/13/18 0350 04/14/18 0120  WBC  6.8 9.0 10.5 9.3 11.1*  HGB 8.8* 9.2* 9.8* 9.1* 9.1*  HCT 30.4* 31.6* 32.4* 30.2* 31.1*  PLT 283 306 299 265 338  MCV 91.6 92.1 89.5 89.6 89.9  MCH 26.5 26.8 27.1 27.0 26.3  MCHC  28.9* 29.1* 30.2 30.1 29.3*  RDW 15.3 15.4 15.9* 16.1* 16.3*  LYMPHSABS  --   --   --   --  1.3  MONOABS  --   --   --   --  0.8  EOSABS  --   --   --   --  0.3  BASOSABS  --   --   --   --  0.0   ------------------------------------------------------------------------------------------------------------------  Chemistries  Recent Labs  Lab 04/10/18 1315 04/11/18 0638 04/12/18 0542 04/13/18 0350 04/14/18 0120  NA 141 139 138 138 141  K 4.1 4.5 4.5 4.6 4.2  CL 101 99 101 98 101  CO2 27 24 25 24 27   GLUCOSE 238* 185* 148* 160* 221*  BUN 30* 34* 36* 41* 42*  CREATININE 1.89* 2.07* 1.84* 1.86* 1.87*  CALCIUM 8.3* 8.4* 8.1* 8.0* 8.0*  AST  --   --   --   --  57*  ALT  --   --   --   --  46*  ALKPHOS  --   --   --   --  115  BILITOT  --   --   --   --  1.4*   ------------------------------------------------------------------------------------------------------------------  ------------------------------------------------------------------------------------------------------------------ GFR: Estimated Creatinine Clearance: 37.3 mL/min (A) (by C-G formula based on SCr of 1.87 mg/dL (H)). Liver Function Tests: Recent Labs  Lab 04/14/18 0120  AST 57*  ALT 46*  ALKPHOS 115  BILITOT 1.4*  PROT 6.7  ALBUMIN 3.1*   No results for input(s): LIPASE, AMYLASE in the last 168 hours. No results for input(s): AMMONIA in the last 168 hours. Coagulation Profile: No results for input(s): INR, PROTIME in the last 168 hours. Cardiac Enzymes: No results for input(s): CKTOTAL, CKMB, CKMBINDEX, TROPONINI in the last 168 hours. BNP (last 3 results) No results for input(s): PROBNP in the last 8760 hours. HbA1C: No results for input(s): HGBA1C in the last 72 hours. CBG: Recent Labs  Lab 04/12/18 1131  04/12/18 1600 04/12/18 2141 04/13/18 0740 04/13/18 1128  GLUCAP 126* 152* 142* 171* 180*   Lipid Profile: No results for input(s): CHOL, HDL, LDLCALC, TRIG, CHOLHDL, LDLDIRECT in the last 72 hours. Thyroid Function Tests: No results for input(s): TSH, T4TOTAL, FREET4, T3FREE, THYROIDAB in the last 72 hours. Anemia Panel: No results for input(s): VITAMINB12, FOLATE, FERRITIN, TIBC, IRON, RETICCTPCT in the last 72 hours.  ------------------------------------  Imaging Results:    Dg Tibia/fibula Left  Result Date: 04/14/2018 CLINICAL DATA:  Fall.  Left lower leg pain EXAM: LEFT TIBIA AND FIBULA - 2 VIEW COMPARISON:  02/19/2016 FINDINGS: There is a fracture through the the proximal left tibial metaphysis and proximal shaft. Proximal fibular fracture also likely present. Hardware noted in the distal femur. Advanced degenerative changes in the left knee. IMPRESSION: Minimally displaced proximal left tibial and fibular fractures. Electronically Signed   By: Rolm Baptise M.D.   On: 04/14/2018 00:35   Dg Ankle Complete Right  Result Date: 04/14/2018 CLINICAL DATA:  Fall, right ankle pain EXAM: RIGHT ANKLE - COMPLETE 3+ VIEW COMPARISON:  None. FINDINGS: Fractures are noted in the distal fibular metaphysis and base of medial malleolus. Minimal displacement. Ankle mortise is intact. Diffuse vascular calcifications. Soft tissue calcifications and soft tissue swelling. IMPRESSION: Distal fibular metaphyseal and medial malleolar fractures. Electronically Signed   By: Rolm Baptise M.D.   On: 04/14/2018 00:37   Dg Hip Unilat W Or Wo Pelvis 2-3 Views Left  Result Date: 04/14/2018 CLINICAL DATA:  Fall,  left leg pain EXAM: DG HIP (WITH OR WITHOUT PELVIS) 2-3V LEFT COMPARISON:  None. FINDINGS: Hardware noted within the left femur with intramedullary nail. Mild degenerative changes in the hips bilaterally. SI joints are symmetric and unremarkable. No acute bony abnormality. Specifically, no fracture,  subluxation, or dislocation. IMPRESSION: No acute bony abnormality. Electronically Signed   By: Rolm Baptise M.D.   On: 04/14/2018 00:36    My personal review of EKG: Rhythm atrial fibrillation   Assessment & Plan:    Active Problems:   Left tibial fracture   1. Left tibial/fibular fracture-x-ray of the left leg showed minimally displaced left tibial/femoral fracture.  Orthopedic surgery has been consulted.  Left place a knee immobilizer.  Dr. Aline Brochure to see patient in a.m. continue Dilaudid as needed for pain  2. Distal femoral metaphyseal and medial malleolar fracture-continue pain control with Dilaudid as above.  Ortho to see in a.m.  3. Chronic diastolic CHF-currently euvolemic, will hold Lasix at this time.  If no surgery is planned consider restarting Lasix in a.m.  4. Atrial fibrillation-heart rate is controlled, continue metoprolol 150 mg twice a day, Eliquis for anticoagulation.  5. Left atrial thrombus-seen on TEE, patient is currently on Eliquis.  Patient is currently on Eliquis 10 mg twice a day for 4 days then switch to 5 mg p.o. twice daily  6. Type 2 diabetes mellitus-we will start sliding scale insulin with NovoLog.  7. Chronic kidney disease stage III-creatinine at baseline.    DVT Prophylaxis-   Eliquis  AM Labs Ordered, also please review Full Orders  Family Communication: Admission, patients condition and plan of care including tests being ordered have been discussed with the patient and her husband at bedside* who indicate understanding and agree with the plan and Code Status.  Code Status: Full code  Admission status: Inpatient  Time spent in minutes : 60 minutes   Oswald Hillock M.D on 04/14/2018 at 2:02 AM  Between 7am to 7pm - Pager - [phone]. After 7pm go to [email or web address] - password [facility]  [facility] - Office  [phone]

## 2018-04-14 NOTE — Progress Notes (Signed)
Patient ID: [name], female   DOB: September 16, 1950, 68 y.o.   MRN: [id number]  BP 127/79 (BP Location: Right Arm)   Pulse 93   Temp (!) 97.5 F (36.4 C) (Oral)   Resp 20   SpO2 95%   NO SURGERY NEEDED I WILL SEE LATER TODAY

## 2018-04-14 NOTE — Progress Notes (Addendum)
Came to patients room for bath - noticed that O2 not on and lips appeared dusky.  Applied O2 back on at 2L and checked sats which were at 84%  - although patient denying SOB.  O2 placed to 4L and now maintaining 90-95%.  Patient is drowsy but easily awakened. Received IV dilaudid earlier this morning. Reported feeling a little nauseous but zofran given IV and already has some relief.  MD paged and made aware.  Will continue to monitor

## 2018-04-14 NOTE — Progress Notes (Signed)
04/13/2018 10:38 PM  04/14/2018 8:58 AM  [name] was seen and examined.  The H&P by the admitting provider, orders, imaging was reviewed.  Please see new orders.  Will continue to follow.  Ortho planning to see patient later today.   Murvin Natal, MD [facility]

## 2018-04-15 DIAGNOSIS — I4891 Unspecified atrial fibrillation: Secondary | ICD-10-CM

## 2018-04-15 DIAGNOSIS — I5032 Chronic diastolic (congestive) heart failure: Secondary | ICD-10-CM

## 2018-04-15 DIAGNOSIS — S82102A Unspecified fracture of upper end of left tibia, initial encounter for closed fracture: Secondary | ICD-10-CM

## 2018-04-15 DIAGNOSIS — S82401A Unspecified fracture of shaft of right fibula, initial encounter for closed fracture: Secondary | ICD-10-CM | POA: Diagnosis present

## 2018-04-15 DIAGNOSIS — E1165 Type 2 diabetes mellitus with hyperglycemia: Secondary | ICD-10-CM

## 2018-04-15 DIAGNOSIS — N183 Chronic kidney disease, stage 3 unspecified: Secondary | ICD-10-CM | POA: Diagnosis present

## 2018-04-15 LAB — GLUCOSE, CAPILLARY
GLUCOSE-CAPILLARY: 87 mg/dL (ref 70–99)
GLUCOSE-CAPILLARY: 83 mg/dL (ref 70–99)
Glucose-Capillary: 109 mg/dL — ABNORMAL HIGH (ref 70–99)
Glucose-Capillary: 85 mg/dL (ref 70–99)

## 2018-04-15 MED ORDER — TIOTROPIUM BROMIDE MONOHYDRATE 18 MCG IN CAPS
18.0000 ug | ORAL_CAPSULE | Freq: Every day | RESPIRATORY_TRACT | Status: DC
  Administered 2018-04-16 – 2018-04-27 (×12): 18 ug via RESPIRATORY_TRACT
  Filled 2018-04-15 (×3): qty 5

## 2018-04-15 MED ORDER — LORATADINE 10 MG PO TABS
10.0000 mg | ORAL_TABLET | Freq: Every day | ORAL | Status: DC
  Administered 2018-04-15 – 2018-04-21 (×7): 10 mg via ORAL
  Filled 2018-04-15 (×7): qty 1

## 2018-04-15 MED ORDER — FLUTICASONE PROPIONATE 50 MCG/ACT NA SUSP
2.0000 | Freq: Every day | NASAL | Status: DC
  Administered 2018-04-15 – 2018-04-19 (×5): 2 via NASAL
  Filled 2018-04-15: qty 16

## 2018-04-15 NOTE — Plan of Care (Signed)
  Problem: Acute Rehab PT Goals(only PT should resolve) Goal: Pt Will Go Supine/Side To Sit Outcome: Progressing Flowsheets (Taken 04/15/2018 1218) Pt will go Supine/Side to Sit: with moderate assist Goal: Patient Will Perform Sitting Balance Outcome: Progressing Flowsheets (Taken 04/15/2018 1218) Patient will perform sitting balance: with modified independence Goal: Pt Will Transfer Bed To Chair/Chair To Bed Outcome: Progressing Flowsheets (Taken 04/15/2018 1218) Pt will Transfer Bed to Chair/Chair to Bed: with mod assist; with max assist Note:  Sliding board  12:18 PM, 04/15/18 Lonell Grandchild, MPT Physical Therapist with [facility] 336 [phone] office [phone] mobile phone

## 2018-04-15 NOTE — Progress Notes (Signed)
PROGRESS NOTE    [name]  WLS:[id number] DOB: 1951/06/10 DOA: 04/13/2018 PCP: [name], MD    Brief Narrative:  67 year old female with a history of diastolic CHF, atrial fibrillation on anticoagulation, diabetes and chronic kidney disease, who was recently discharged the hospital on 8/27.  At that time, she was advised to go to skilled nursing facility, but had initially wanted to go home.  Upon returning home, patient had a fall while transferring from her wheelchair.  She is suffered bilateral fractures in her lower extremities.  Seen by orthopedics and plan is for nonoperative management.  Seen by physical therapy who recommended skilled nursing facility placement.   Assessment & Plan:   Active Problems:   Diabetes type 2, uncontrolled (HCC)   HTN (hypertension), benign   Left tibial fracture   Unspecified atrial fibrillation (HCC)   Chronic diastolic CHF (congestive heart failure) (HCC)   CKD (chronic kidney disease), stage III (HCC)   Closed right fibular fracture   1. Bilateral lower extremity fractures status post fall.  Seen by orthopedics and management will be nonoperative.  She will be nonweightbearing on both lower extremities.  Continue pain management.  Seen by physical therapy recommended skilled nursing facility placement 2. Chronic diastolic congestive heart failure.  Appears to be euvolemic.  Continued on Lasix. 3. Chronic atrial fibrillation.  Heart rate currently controlled on beta-blockers.  Anticoagulated with Eliquis. 4. Left atrial thrombus.  Recently diagnosed on anticoagulation with Eliquis. 5. Type 2 diabetes.  Sugars currently stable.  Glipizide on hold.  Continue on sliding scale 6. Chronic kidney disease stage III.  Creatinine is currently at baseline.  We will continue to monitor.   DVT prophylaxis: Eliquis Code Status: Full code Family Communication: No family present Disposition Plan: Needs placement   Consultants:    Orthopedics  Procedures:     Antimicrobials:       Subjective: Reports continued pain in her legs bilaterally.  No shortness of breath.  Objective: Vitals:   04/15/18 0602 04/15/18 1502 04/15/18 1600 04/15/18 1620  BP: (!) 92/54  123/69   Pulse: 60  (!) 118 70  Resp: 19  18   Temp: 98.2 F (36.8 C)  (!) 97.4 F (36.3 C)   TempSrc:      SpO2: 90%  97%   Weight:  120.5 kg    Height:  5' 4"  (1.626 m)      Intake/Output Summary (Last 24 hours) at 04/15/2018 1726 Last data filed at 04/15/2018 0602 Gross per 24 hour  Intake 120 ml  Output 400 ml  Net -280 ml   Filed Weights   04/15/18 1502  Weight: 120.5 kg    Examination:  General exam: Appears calm and comfortable  Respiratory system: Clear to auscultation. Respiratory effort normal. Cardiovascular system: S1 & S2 heard, RRR. No JVD, murmurs, rubs, gallops or clicks. No pedal edema. Gastrointestinal system: Abdomen is nondistended, soft and nontender. No organomegaly or masses felt. Normal bowel sounds heard. Central nervous system: Alert and oriented. No focal neurological deficits. Extremities: Symmetric 5 x 5 power. Skin: No rashes, lesions or ulcers Psychiatry: Judgement and insight appear normal. Mood & affect appropriate.     Data Reviewed: I have personally reviewed following labs and imaging studies  CBC: Recent Labs  Lab 04/10/18 1315 04/12/18 0542 04/13/18 0350 04/14/18 0120 04/14/18 0514  WBC 9.0 10.5 9.3 11.1* 12.3*  NEUTROABS  --   --   --  8.7*  --   HGB 9.2* 9.8*  9.1* 9.1* 9.3*  HCT 31.6* 32.4* 30.2* 31.1* 31.7*  MCV 92.1 89.5 89.6 89.9 90.8  PLT 306 299 265 338 335   Basic Metabolic Panel: Recent Labs  Lab 04/11/18 0638 04/12/18 0542 04/13/18 0350 04/14/18 0120 04/14/18 0514  NA 139 138 138 141 140  K 4.5 4.5 4.6 4.2 4.2  CL 99 101 98 101 100  CO2 24 25 24 27 24   GLUCOSE 185* 148* 160* 221* 222*  BUN 34* 36* 41* 42* 41*  CREATININE 2.07* 1.84* 1.86* 1.87* 1.86*   CALCIUM 8.4* 8.1* 8.0* 8.0* 7.9*   GFR: Estimated Creatinine Clearance: 37.5 mL/min (A) (by C-G formula based on SCr of 1.86 mg/dL (H)). Liver Function Tests: Recent Labs  Lab 04/14/18 0120 04/14/18 0514  AST 57* 59*  ALT 46* 50*  ALKPHOS 115 112  BILITOT 1.4* 1.4*  PROT 6.7 6.6  ALBUMIN 3.1* 3.1*   No results for input(s): LIPASE, AMYLASE in the last 168 hours. No results for input(s): AMMONIA in the last 168 hours. Coagulation Profile: No results for input(s): INR, PROTIME in the last 168 hours. Cardiac Enzymes: No results for input(s): CKTOTAL, CKMB, CKMBINDEX, TROPONINI in the last 168 hours. BNP (last 3 results) No results for input(s): PROBNP in the last 8760 hours. HbA1C: Recent Labs    04/14/18 0120  HGBA1C 8.3*   CBG: Recent Labs  Lab 04/14/18 1600 04/14/18 2209 04/15/18 0745 04/15/18 1140 04/15/18 1628  GLUCAP 135* 103* 87 109* 83   Lipid Profile: No results for input(s): CHOL, HDL, LDLCALC, TRIG, CHOLHDL, LDLDIRECT in the last 72 hours. Thyroid Function Tests: No results for input(s): TSH, T4TOTAL, FREET4, T3FREE, THYROIDAB in the last 72 hours. Anemia Panel: No results for input(s): VITAMINB12, FOLATE, FERRITIN, TIBC, IRON, RETICCTPCT in the last 72 hours. Sepsis Labs: No results for input(s): PROCALCITON, LATICACIDVEN in the last 168 hours.  No results found for this or any previous visit (from the past 240 hour(s)).       Radiology Studies: Dg Tibia/fibula Left  Result Date: 04/14/2018 CLINICAL DATA:  Fall.  Left lower leg pain EXAM: LEFT TIBIA AND FIBULA - 2 VIEW COMPARISON:  02/19/2016 FINDINGS: There is a fracture through the the proximal left tibial metaphysis and proximal shaft. Proximal fibular fracture also likely present. Hardware noted in the distal femur. Advanced degenerative changes in the left knee. IMPRESSION: Minimally displaced proximal left tibial and fibular fractures. Electronically Signed   By: Rolm Baptise M.D.   On:  04/14/2018 00:35   Dg Ankle Complete Right  Result Date: 04/14/2018 CLINICAL DATA:  Fall, right ankle pain EXAM: RIGHT ANKLE - COMPLETE 3+ VIEW COMPARISON:  None. FINDINGS: Fractures are noted in the distal fibular metaphysis and base of medial malleolus. Minimal displacement. Ankle mortise is intact. Diffuse vascular calcifications. Soft tissue calcifications and soft tissue swelling. IMPRESSION: Distal fibular metaphyseal and medial malleolar fractures. Electronically Signed   By: Rolm Baptise M.D.   On: 04/14/2018 00:37   Dg Chest Port 1 View  Result Date: 04/14/2018 CLINICAL DATA:  68 year old female with a history of hypoxia EXAM: PORTABLE CHEST 1 VIEW COMPARISON:  04/10/2018 FINDINGS: Cardiomediastinal silhouette unchanged in size and contour. No evidence of central vascular congestion. No pneumothorax or pleural effusion. Low lung volumes. Similar appearance of coarsened interstitial markings. No confluent airspace disease. No displaced fracture. IMPRESSION: No evidence of acute cardiopulmonary disease. Electronically Signed   By: Corrie Mckusick D.O.   On: 04/14/2018 14:13   Dg Hip Unilat W Or Wo  Pelvis 2-3 Views Left  Result Date: 04/14/2018 CLINICAL DATA:  Fall, left leg pain EXAM: DG HIP (WITH OR WITHOUT PELVIS) 2-3V LEFT COMPARISON:  None. FINDINGS: Hardware noted within the left femur with intramedullary nail. Mild degenerative changes in the hips bilaterally. SI joints are symmetric and unremarkable. No acute bony abnormality. Specifically, no fracture, subluxation, or dislocation. IMPRESSION: No acute bony abnormality. Electronically Signed   By: Rolm Baptise M.D.   On: 04/14/2018 00:36        Scheduled Meds: . apixaban  10 mg Oral BID  . [START ON 04/18/2018] apixaban  5 mg Oral BID  . buPROPion  300 mg Oral Daily  . fluticasone  2 spray Each Nare Daily  . furosemide  40 mg Oral Daily  . insulin aspart  0-9 Units Subcutaneous TID WC  . insulin aspart  3 Units Subcutaneous TID WC   . loratadine  10 mg Oral Daily  . metoprolol tartrate  150 mg Oral BID  . ondansetron (ZOFRAN) IV  4 mg Intravenous Q6H  . sodium chloride flush  3 mL Intravenous Q12H  . tiotropium  18 mcg Inhalation Daily   Continuous Infusions: . sodium chloride       LOS: 1 day    Time spent: 70mns    JKathie Dike MD [facility] Pager [phone] If 7PM-7AM, please contact night-coverage [email or web address] Password [email or web address]8/29/2019, 5:26 PM

## 2018-04-15 NOTE — Evaluation (Signed)
Physical Therapy Evaluation Patient Details Name: [name] MRN: [id number] DOB: [birth year] Today's Date: 04/15/2018   History of Present Illness  [name]  is a 67 y.o. female, with history of chronic diastolic CHF, atrial fibrillation on anticoagulation with Eliquis, left atrial thrombus, type 2 diabetes mellitus, chronic kidney disease stage III hypertension, anemia who was just discharged from the hospital on 04/13/2018.  Patient at home was trying to transfer herself from wheelchair to left ear and she missed the left.  And injured left lower leg and right ankle.  Patient was brought to hospital.  She denies passing out.  No dizziness or blurred vision.  No chest pain or shortness of breath.  No nausea vomiting or diarrhea.  No abdominal pain.  No dysuria, urgency or frequency of urination.    Clinical Impression  Patient NWB BLE with immobilizer left knee, cam boot right ankle.  Patient required assistance to move BLE during supine to sitting with c/o severe pain left knee with any movement, demonstrates fair sitting balance having to support self with BUE, unable to scoot laterally due to generalized weakness and fatigue.  Patient required Mod/max assist to reposition with bed in head down position when put back to bed.  Patient will benefit from continued physical therapy in hospital and recommended venue below to increase strength, balance, endurance for safe ADLs and gait.    Follow Up Recommendations SNF;Supervision/Assistance - 24 hour    Equipment Recommendations  None recommended by PT    Recommendations for Other Services       Precautions / Restrictions Precautions Precautions: Fall Restrictions Weight Bearing Restrictions: No      Mobility  Bed Mobility Overal bed mobility: Needs Assistance Bed Mobility: Supine to Sit;Sit to Supine     Supine to sit: Mod assist;Max assist Sit to supine: Max assist   General bed mobility comments: requires assistance to  move BLE  Transfers                    Ambulation/Gait                Stairs            Wheelchair Mobility    Modified Rankin (Stroke Patients Only)       Balance Overall balance assessment: Needs assistance Sitting-balance support: Feet supported;Bilateral upper extremity supported Sitting balance-Leahy Scale: Fair                                       Pertinent Vitals/Pain Pain Assessment: Faces Faces Pain Scale: Hurts worst Pain Location: with movement of left knee Pain Descriptors / Indicators: Grimacing;Guarding;Moaning;Sharp Pain Intervention(s): Limited activity within patient's tolerance;Monitored during session;Premedicated before session    Home Living Family/patient expects to be discharged to:: Private residence Living Arrangements: Spouse/significant other Available Help at Discharge: Family;Available PRN/intermittently Type of Home: Mobile home Home Access: Ramped entrance     Home Layout: One level Home Equipment: Walker - standard;Bedside commode;Grab bars - tub/shower;Shower seat;Wheelchair - manual;Tub bench      Prior Function Level of Independence: Needs assistance   Gait / Transfers Assistance Needed: Mod Indep transfers bed<>w/c, w/c<>commode, sometimes assisted by spouse when fatigued  ADL's / Homemaking Assistance Needed: assisted by spouse        Hand Dominance        Extremity/Trunk Assessment   Upper Extremity Assessment Upper Extremity Assessment: Generalized  weakness    Lower Extremity Assessment Lower Extremity Assessment: Generalized weakness;RLE deficits/detail;LLE deficits/detail RLE Deficits / Details: grossly -3/5 except right ankle not tested LLE Deficits / Details: grossly -2/5 LLE due to left knee pain (left knee not tested)    Cervical / Trunk Assessment Cervical / Trunk Assessment: Kyphotic  Communication   Communication: No difficulties  Cognition Arousal/Alertness:  Awake/alert Behavior During Therapy: WFL for tasks assessed/performed Overall Cognitive Status: Within Functional Limits for tasks assessed                                        General Comments      Exercises     Assessment/Plan    PT Assessment Patient needs continued PT services  PT Problem List Decreased strength;Decreased activity tolerance;Decreased balance;Decreased mobility;Pain(severe left knee pain)       PT Treatment Interventions Functional mobility training;Therapeutic activities;Therapeutic exercise;Wheelchair mobility training;Patient/family education    PT Goals (Current goals can be found in the Care Plan section)  Acute Rehab PT Goals Patient Stated Goal: return home after rehab Time For Goal Achievement: 04/29/18 Potential to Achieve Goals: Fair    Frequency Min 3X/week   Barriers to discharge        Co-evaluation               AM-PAC PT "6 Clicks" Daily Activity  Outcome Measure Difficulty turning over in bed (including adjusting bedclothes, sheets and blankets)?: A Lot Difficulty moving from lying on back to sitting on the side of the bed? : A Lot Difficulty sitting down on and standing up from a chair with arms (e.g., wheelchair, bedside commode, etc,.)?: Unable Help needed moving to and from a bed to chair (including a wheelchair)?: Total Help needed walking in hospital room?: Total Help needed climbing 3-5 steps with a railing? : Total 6 Click Score: 8    End of Session   Activity Tolerance: Patient limited by pain;Patient limited by fatigue Patient left: in bed;with call bell/phone within reach Nurse Communication: Mobility status PT Visit Diagnosis: History of falling (Z91.81);Muscle weakness (generalized) (M62.81);Difficulty in walking, not elsewhere classified (R26.2)    Time: 8938-1017 PT Time Calculation (min) (ACUTE ONLY): 32 min   Charges:   PT Evaluation $PT Eval Moderate Complexity: 1 Mod PT  Treatments $Therapeutic Activity: 23-37 mins        12:16 PM, 04/15/18 Lonell Grandchild, MPT Physical Therapist with [facility] [phone] office [phone] mobile phone

## 2018-04-16 DIAGNOSIS — I10 Essential (primary) hypertension: Secondary | ICD-10-CM

## 2018-04-16 DIAGNOSIS — S8254XA Nondisplaced fracture of medial malleolus of right tibia, initial encounter for closed fracture: Secondary | ICD-10-CM

## 2018-04-16 LAB — BASIC METABOLIC PANEL
Anion gap: 15 (ref 5–15)
BUN: 47 mg/dL — ABNORMAL HIGH (ref 8–23)
CHLORIDE: 98 mmol/L (ref 98–111)
CO2: 26 mmol/L (ref 22–32)
CREATININE: 2.18 mg/dL — AB (ref 0.44–1.00)
Calcium: 7.6 mg/dL — ABNORMAL LOW (ref 8.9–10.3)
GFR calc non Af Amer: 22 mL/min — ABNORMAL LOW (ref >60)
GFR, EST AFRICAN AMERICAN: 26 mL/min — AB (ref >60)
Glucose, Bld: 112 mg/dL — ABNORMAL HIGH (ref 70–99)
POTASSIUM: 4.1 mmol/L (ref 3.5–5.1)
SODIUM: 139 mmol/L (ref 135–145)

## 2018-04-16 LAB — CBC
HCT: 31.6 % — ABNORMAL LOW (ref 36.0–46.0)
HEMOGLOBIN: 9.3 g/dL — AB (ref 12.0–15.0)
MCH: 26.7 pg (ref 26.0–34.0)
MCHC: 29.4 g/dL — ABNORMAL LOW (ref 30.0–36.0)
MCV: 90.8 fL (ref 78.0–100.0)
Platelets: 333 10*3/uL (ref 150–400)
RBC: 3.48 MIL/uL — AB (ref 3.87–5.11)
RDW: 16.6 % — ABNORMAL HIGH (ref 11.5–15.5)
WBC: 12.2 10*3/uL — ABNORMAL HIGH (ref 4.0–10.5)

## 2018-04-16 LAB — GLUCOSE, CAPILLARY
GLUCOSE-CAPILLARY: 74 mg/dL (ref 70–99)
Glucose-Capillary: 108 mg/dL — ABNORMAL HIGH (ref 70–99)
Glucose-Capillary: 99 mg/dL (ref 70–99)
Glucose-Capillary: 101 mg/dL — ABNORMAL HIGH (ref 70–99)

## 2018-04-16 MED ORDER — OXYCODONE-ACETAMINOPHEN 5-325 MG PO TABS
1.0000 | ORAL_TABLET | ORAL | Status: DC | PRN
  Administered 2018-04-16: 1 via ORAL
  Administered 2018-04-17 – 2018-04-21 (×10): 2 via ORAL
  Filled 2018-04-16 (×5): qty 2
  Filled 2018-04-16: qty 1
  Filled 2018-04-16 (×5): qty 2

## 2018-04-16 MED ORDER — DILTIAZEM HCL 30 MG PO TABS
30.0000 mg | ORAL_TABLET | Freq: Three times a day (TID) | ORAL | Status: DC
  Administered 2018-04-16 – 2018-04-18 (×6): 30 mg via ORAL
  Filled 2018-04-16 (×6): qty 1

## 2018-04-16 NOTE — Progress Notes (Addendum)
Physical Therapy Treatment Patient Details Name: [name] MRN: [id number] DOB: 1950-12-08 Today's Date: 04/16/2018    History of Present Illness [name]  is a 67 y.o. female, with history of chronic diastolic CHF, atrial fibrillation on anticoagulation with Eliquis, left atrial thrombus, type 2 diabetes mellitus, chronic kidney disease stage III hypertension, anemia who was just discharged from the hospital on 04/13/2018.  Patient at home was trying to transfer herself from wheelchair to left ear and she missed the left.  And injured left lower leg and right ankle.  Patient was brought to hospital.  She denies passing out.  No dizziness or blurred vision.  No chest pain or shortness of breath.  No nausea vomiting or diarrhea.  No abdominal pain.  No dysuria, urgency or frequency of urination.  PT Comments    Pt declined OOB over pain but agreed to exercises for ROM tolegs to ease her symptoms of pain.  Repositioned her L ankle to unweight heel which is getting softer.  Follow acutely for progressing strengthening and balance, then increase independence with transfers OOB.     Follow Up Recommendations  SNF     Equipment Recommendations  None recommended by PT    Recommendations for Other Services       Precautions / Restrictions Precautions Precautions: Fall Precaution Comments: watch HR  Required Braces or Orthoses: Knee Immobilizer - Left;Other Brace/Splint(R ankle hard keel) Knee Immobilizer - Left: On at all times Other Brace/Splint: cam boot Restrictions Weight Bearing Restrictions: Yes Other Position/Activity Restrictions: NWB BLE's    Mobility  Bed Mobility Overal bed mobility: Needs Assistance             General bed mobility comments: declined  Transfers                    Ambulation/Gait                 Stairs             Wheelchair Mobility    Modified Rankin (Stroke Patients Only)       Balance                                            Cognition Arousal/Alertness: Awake/alert Behavior During Therapy: WFL for tasks assessed/performed Overall Cognitive Status: Within Functional Limits for tasks assessed                                        Exercises General Exercises - Lower Extremity Ankle Circles/Pumps: AAROM;Left;5 reps Quad Sets: AROM;Both;10 reps Gluteal Sets: AROM;Both;10 reps Heel Slides: AAROM;Right;10 reps Hip ABduction/ADduction: AROM;AAROM;Both;10 reps Straight Leg Raises: AROM;AAROM;Both;10 reps    General Comments        Pertinent Vitals/Pain Pain Assessment: 0-10 Pain Score: 10-Worst pain ever Pain Location: with movement of left knee Pain Descriptors / Indicators: Grimacing;Guarding;Moaning;Sharp Pain Intervention(s): Limited activity within patient's tolerance;Monitored during session;Premedicated before session;Repositioned    Home Living                      Prior Function            PT Goals (current goals can now be found in the care plan section) Acute Rehab PT Goals Patient Stated Goal: return home  after rehab Progress towards PT goals: Progressing toward goals    Frequency    Min 3X/week      PT Plan Current plan remains appropriate    Co-evaluation              AM-PAC PT "6 Clicks" Daily Activity  Outcome Measure  Difficulty turning over in bed (including adjusting bedclothes, sheets and blankets)?: A Lot Difficulty moving from lying on back to sitting on the side of the bed? : Unable Difficulty sitting down on and standing up from a chair with arms (e.g., wheelchair, bedside commode, etc,.)?: Unable Help needed moving to and from a bed to chair (including a wheelchair)?: Total Help needed walking in hospital room?: Total Help needed climbing 3-5 steps with a railing? : Total 6 Click Score: 7    End of Session   Activity Tolerance: Patient limited by pain;Patient limited by fatigue Patient  left: in bed;with call bell/phone within reach Nurse Communication: Mobility status PT Visit Diagnosis: History of falling (Z91.81);Muscle weakness (generalized) (M62.81);Difficulty in walking, not elsewhere classified (R26.2)     Time: 6282-4175 PT Time Calculation (min) (ACUTE ONLY): 15 min  Charges:  $Therapeutic Exercise: 8-22 mins                     Ramond Dial 04/16/2018, 11:53 PM   11:55 PM, 04/16/18 Mee Hives, PT, MS Physical Therapist - [place] [phone] (Office)

## 2018-04-16 NOTE — NC FL2 (Signed)
[place] LEVEL OF CARE SCREENING TOOL     IDENTIFICATION  Patient Name: [name] Birthdate: [birth year] Sex: female Admission Date (Current Location): 04/13/2018  [facility] and [place] Number:  [employer] and Address:  [place] [address] [place]      Provider Number: [id number]  Attending Physician Name and Address:  Kathie Dike, MD  Relative Name and Phone Number:       Current Level of Care: Hospital Recommended Level of Care: [place] Prior Approval Number:    Date Approved/Denied:   PASRR Number: [id number]  Discharge Plan: SNF    Current Diagnoses: Patient Active Problem List   Diagnosis Date Noted  . Unspecified atrial fibrillation ([place]) 04/15/2018  . Chronic diastolic CHF (congestive heart failure) ([place]) 04/15/2018  . CKD (chronic kidney disease), stage III ([place]) 04/15/2018  . Closed right fibular fracture 04/15/2018  . Left tibial fracture 04/14/2018  . CHF exacerbation ([place]) 04/10/2018  . SOB (shortness of breath)   . Acute CHF (congestive heart failure) ([place]) 04/01/2018  . Atrial fibrillation with RVR ([place]) 04/01/2018  . [place] 03/11/2016  . Muscle spasms of lower extremity 03/04/2016  . Acute renal insufficiency 02/26/2016  . History of MRSA infection 02/26/2016  . Diabetes type 2, uncontrolled ([place]) 02/19/2016  . HTN (hypertension), benign 02/19/2016  . Fracture, femur, distal ([place]) 02/19/2016  . Fall 02/19/2016  . Depression 02/19/2016  . Closed left femoral fracture, initial encounter 02/19/2016  . Femur fracture, left ([place]) 02/19/2016    Orientation RESPIRATION BLADDER Height & Weight     Self, Time, Situation, Place  Normal Continent Weight: 265 lb 10.5 oz (120.5 kg) Height:  5' 4"  (162.6 cm)  BEHAVIORAL SYMPTOMS/MOOD NEUROLOGICAL BOWEL NUTRITION STATUS      Continent (carb modified)  AMBULATORY STATUS COMMUNICATION OF NEEDS Skin   Total Care(NWB BLE with  immobilizer left knee, cam boot right ankle) Verbally Other (Comment)(wound: abdomen, buttocks)                       Personal Care Assistance Level of Assistance  Bathing, Feeding, Dressing Bathing Assistance: Maximum assistance Feeding assistance: Independent Dressing Assistance: Maximum assistance     Functional Limitations Info  Sight, Hearing, Speech Sight Info: Adequate Hearing Info: Adequate Speech Info: Adequate    SPECIAL CARE FACTORS FREQUENCY  PT (By licensed PT)     PT Frequency: 5x/week              Contractures      Additional Factors Info  Code Status, Allergies, Psychotropic Code Status Info: Full Code Allergies Info: Latex Psychotropic Info: Wellbutrin         Current Medications (04/16/2018):  This is the current hospital active medication list Current Facility-Administered Medications  Medication Dose Route Frequency Provider Last Rate Last Dose  . 0.9 %  sodium chloride infusion  250 mL Intravenous PRN Oswald Hillock, MD      . apixaban (ELIQUIS) tablet 10 mg  10 mg Oral BID Wynetta Emery, Clanford L, MD   10 mg at 04/16/18 0903  . [START ON 04/18/2018] apixaban (ELIQUIS) tablet 5 mg  5 mg Oral BID Johnson, Clanford L, MD      . buPROPion (WELLBUTRIN XL) 24 hr tablet 300 mg  300 mg Oral Daily Oswald Hillock, MD   300 mg at 04/16/18 5638  . fluticasone (FLONASE) 50 MCG/ACT nasal spray 2 spray  2 spray Each Nare  Daily Kathie Dike, MD   2 spray at 04/16/18 0902  . furosemide (LASIX) tablet 40 mg  40 mg Oral Daily Johnson, Clanford L, MD   40 mg at 04/16/18 0903  . insulin aspart (novoLOG) injection 0-9 Units  0-9 Units Subcutaneous TID WC Oswald Hillock, MD   1 Units at 04/14/18 1620  . insulin aspart (novoLOG) injection 3 Units  3 Units Subcutaneous TID WC Johnson, Clanford L, MD   3 Units at 04/16/18 1234  . loratadine (CLARITIN) tablet 10 mg  10 mg Oral Daily Kathie Dike, MD   10 mg at 04/16/18 0903  . metoprolol tartrate (LOPRESSOR) tablet 150  mg  150 mg Oral BID Oswald Hillock, MD   150 mg at 04/16/18 0902  . ondansetron (ZOFRAN) injection 4 mg  4 mg Intravenous Q6H Oswald Hillock, MD   4 mg at 04/14/18 1621  . ondansetron (ZOFRAN) injection 4 mg  4 mg Intravenous Q6H PRN Darrick Meigs, Marge Duncans, MD      . oxyCODONE-acetaminophen (PERCOCET/ROXICET) 5-325 MG per tablet 1-2 tablet  1-2 tablet Oral Q4H PRN Kathie Dike, MD      . sodium chloride flush (NS) 0.9 % injection 3 mL  3 mL Intravenous Q12H Oswald Hillock, MD   3 mL at 04/16/18 0903  . sodium chloride flush (NS) 0.9 % injection 3 mL  3 mL Intravenous PRN Oswald Hillock, MD   3 mL at 04/15/18 1628  . tiotropium (SPIRIVA) inhalation capsule 18 mcg  18 mcg Inhalation Daily Kathie Dike, MD   18 mcg at 04/16/18 0900     Discharge Medications: Please see discharge summary for a list of discharge medications.  Relevant Imaging Results:  Relevant Lab Results:   Additional Information SSN: [id number]  Ihor Gully, LCSW

## 2018-04-16 NOTE — Clinical Social Work Note (Signed)
Clinical Social Work Assessment  Patient Details  Name: [name] MRN: [id number] Date of Birth: [birth year]  Date of referral:  04/16/18               Reason for consult:  Facility Placement                Permission sought to share information with:    Permission granted to share information::     Name::        Agency::     Relationship::     Contact Information:     Housing/Transportation Living arrangements for the past 2 months:  Single Family Home Source of Information:  Patient Patient Interpreter Needed:  None Criminal Activity/Legal Involvement Pertinent to Current Situation/Hospitalization:  No - Comment as needed Significant Relationships:  Other Family Members, Spouse Lives with:  Spouse Do you feel safe going back to the place where you live?  Yes Need for family participation in patient care:  Yes (Comment)  Care giving concerns:  None identified at baseline.    Social Worker assessment / plan:  At baseline, patient uses a wheelchair, still drives and cares for her 53 year old grandchild. She is apprehensive about short term rehab, however after much discussion she agrees.    Employment status:    Insurance information:    PT Recommendations:    Information / Referral to community resources:  [place]  Patient/Family's Response to care:  Patient is agreeable to SNF.   Patient/Family's Understanding of and Emotional Response to Diagnosis, Current Treatment, and Prognosis:  Patient understands her diagnosis, treatment and prognosis.   Emotional Assessment Appearance:  Appears stated age Attitude/Demeanor/Rapport:    Affect (typically observed):  Apprehensive Orientation:  Oriented to Self, Oriented to Place, Oriented to  Time, Oriented to Situation Alcohol / Substance use:  Not Applicable Psych involvement (Current and /or in the community):  No (Comment)  Discharge Needs  Concerns to be addressed:  Discharge Planning  Concerns Readmission within the last 30 days:  Yes Current discharge risk:  None Barriers to Discharge:  Insurance Authorization   Ihor Gully, LCSW 04/16/2018, 3:03 PM

## 2018-04-16 NOTE — Progress Notes (Signed)
PROGRESS NOTE    [name]  GNF:[id number] DOB: 03/28/1951 DOA: 04/13/2018 PCP: [name], MD    Brief Narrative:  67 year old female with a history of diastolic CHF, atrial fibrillation on anticoagulation, diabetes and chronic kidney disease, who was recently discharged the hospital on 8/27.  At that time, she was advised to go to skilled nursing facility, but had initially wanted to go home.  Upon returning home, patient had a fall while transferring from her wheelchair.  She is suffered bilateral fractures in her lower extremities.  Seen by orthopedics and plan is for nonoperative management.  Seen by physical therapy who recommended skilled nursing facility placement.   Assessment & Plan:   Active Problems:   Diabetes type 2, uncontrolled (HCC)   HTN (hypertension), benign   Left tibial fracture   Unspecified atrial fibrillation (HCC)   Chronic diastolic CHF (congestive heart failure) (HCC)   CKD (chronic kidney disease), stage III (HCC)   Closed right fibular fracture   1. Bilateral lower extremity fractures status post fall.  Seen by orthopedics and management will be nonoperative.  She will be nonweightbearing on both lower extremities.  Continue pain management.  Seen by physical therapy recommended skilled nursing facility placement. Patient is agreeable for placement 2. Chronic diastolic congestive heart failure.  Appears to be euvolemic.  Continued on Lasix. 3. Chronic atrial fibrillation.  Having some periods of tachycardia. Will add low dose diltiazem.  Anticoagulated with Eliquis. 4. Left atrial thrombus.  Recently diagnosed, currently on anticoagulation with Eliquis. 5. Type 2 diabetes.  Blood sugars currently stable.  Glipizide on hold.  Continue on sliding scale 6. Chronic kidney disease stage III.  Creatinine is currently at baseline.  We will continue to monitor.   DVT prophylaxis: Eliquis Code Status: Full code Family Communication: No family  present Disposition Plan: Awaiting placement to SNF   Consultants:   Orthopedics  Procedures:     Antimicrobials:       Subjective: Feeling better today. Still has pain in legs, but pain medicine is helping. She is agreeable to go to SNF  Objective: Vitals:   04/15/18 2140 04/16/18 0609 04/16/18 1206 04/16/18 1427  BP:  (!) 139/114 120/74 129/81  Pulse: 100 (!) 120 (!) 101 86  Resp:  18 16 18   Temp:  (!) 97.5 F (36.4 C) 97.6 F (36.4 C) 97.6 F (36.4 C)  TempSrc:  Oral Oral Oral  SpO2: 94% 98% 100% 95%  Weight:      Height:        Intake/Output Summary (Last 24 hours) at 04/16/2018 1822 Last data filed at 04/16/2018 0700 Gross per 24 hour  Intake -  Output 700 ml  Net -700 ml   Filed Weights   04/15/18 1502  Weight: 120.5 kg    Examination:  General exam: Alert, awake, oriented x 3 Respiratory system: Clear to auscultation. Respiratory effort normal. Cardiovascular system:irregularly irregular. No murmurs, rubs, gallops. Gastrointestinal system: Abdomen is nondistended, soft and nontender. No organomegaly or masses felt. Normal bowel sounds heard. Central nervous system: Alert and oriented. No focal neurological deficits. Extremities: lower extremities in brace/boot Skin: No rashes, lesions or ulcers Psychiatry: Judgement and insight appear normal. Mood & affect appropriate.    Data Reviewed: I have personally reviewed following labs and imaging studies  CBC: Recent Labs  Lab 04/12/18 0542 04/13/18 0350 04/14/18 0120 04/14/18 0514 04/16/18 0652  WBC 10.5 9.3 11.1* 12.3* 12.2*  NEUTROABS  --   --  8.7*  --   --  HGB 9.8* 9.1* 9.1* 9.3* 9.3*  HCT 32.4* 30.2* 31.1* 31.7* 31.6*  MCV 89.5 89.6 89.9 90.8 90.8  PLT 299 265 338 305 670   Basic Metabolic Panel: Recent Labs  Lab 04/12/18 0542 04/13/18 0350 04/14/18 0120 04/14/18 0514 04/16/18 0652  NA 138 138 141 140 139  K 4.5 4.6 4.2 4.2 4.1  CL 101 98 101 100 98  CO2 25 24 27 24 26     GLUCOSE 148* 160* 221* 222* 112*  BUN 36* 41* 42* 41* 47*  CREATININE 1.84* 1.86* 1.87* 1.86* 2.18*  CALCIUM 8.1* 8.0* 8.0* 7.9* 7.6*   GFR: Estimated Creatinine Clearance: 32 mL/min (A) (by C-G formula based on SCr of 2.18 mg/dL (H)). Liver Function Tests: Recent Labs  Lab 04/14/18 0120 04/14/18 0514  AST 57* 59*  ALT 46* 50*  ALKPHOS 115 112  BILITOT 1.4* 1.4*  PROT 6.7 6.6  ALBUMIN 3.1* 3.1*   No results for input(s): LIPASE, AMYLASE in the last 168 hours. No results for input(s): AMMONIA in the last 168 hours. Coagulation Profile: No results for input(s): INR, PROTIME in the last 168 hours. Cardiac Enzymes: No results for input(s): CKTOTAL, CKMB, CKMBINDEX, TROPONINI in the last 168 hours. BNP (last 3 results) No results for input(s): PROBNP in the last 8760 hours. HbA1C: Recent Labs    04/14/18 0120  HGBA1C 8.3*   CBG: Recent Labs  Lab 04/15/18 1628 04/15/18 2139 04/16/18 0755 04/16/18 1135 04/16/18 1641  GLUCAP 83 85 108* 99 74   Lipid Profile: No results for input(s): CHOL, HDL, LDLCALC, TRIG, CHOLHDL, LDLDIRECT in the last 72 hours. Thyroid Function Tests: No results for input(s): TSH, T4TOTAL, FREET4, T3FREE, THYROIDAB in the last 72 hours. Anemia Panel: No results for input(s): VITAMINB12, FOLATE, FERRITIN, TIBC, IRON, RETICCTPCT in the last 72 hours. Sepsis Labs: No results for input(s): PROCALCITON, LATICACIDVEN in the last 168 hours.  No results found for this or any previous visit (from the past 240 hour(s)).       Radiology Studies: No results found.      Scheduled Meds: . apixaban  10 mg Oral BID  . [START ON 04/18/2018] apixaban  5 mg Oral BID  . buPROPion  300 mg Oral Daily  . diltiazem  30 mg Oral Q8H  . fluticasone  2 spray Each Nare Daily  . furosemide  40 mg Oral Daily  . insulin aspart  0-9 Units Subcutaneous TID WC  . insulin aspart  3 Units Subcutaneous TID WC  . loratadine  10 mg Oral Daily  . metoprolol tartrate   150 mg Oral BID  . ondansetron (ZOFRAN) IV  4 mg Intravenous Q6H  . sodium chloride flush  3 mL Intravenous Q12H  . tiotropium  18 mcg Inhalation Daily   Continuous Infusions: . sodium chloride       LOS: 2 days    Time spent: 61mns    JKathie Dike MD [facility] Pager [phone] If 7PM-7AM, please contact night-coverage [email or web address] Password [email or web address]8/30/2019, 6:22 PM

## 2018-04-17 LAB — BASIC METABOLIC PANEL
Anion gap: 16 — ABNORMAL HIGH (ref 5–15)
BUN: 46 mg/dL — ABNORMAL HIGH (ref 8–23)
CALCIUM: 7.6 mg/dL — AB (ref 8.9–10.3)
CO2: 26 mmol/L (ref 22–32)
CREATININE: 1.95 mg/dL — AB (ref 0.44–1.00)
Chloride: 96 mmol/L — ABNORMAL LOW (ref 98–111)
GFR calc Af Amer: 29 mL/min — ABNORMAL LOW (ref >60)
GFR calc non Af Amer: 25 mL/min — ABNORMAL LOW (ref >60)
GLUCOSE: 196 mg/dL — AB (ref 70–99)
POTASSIUM: 3.7 mmol/L (ref 3.5–5.1)
SODIUM: 138 mmol/L (ref 135–145)

## 2018-04-17 LAB — GLUCOSE, CAPILLARY
GLUCOSE-CAPILLARY: 110 mg/dL — AB (ref 70–99)
GLUCOSE-CAPILLARY: 161 mg/dL — AB (ref 70–99)
Glucose-Capillary: 205 mg/dL — ABNORMAL HIGH (ref 70–99)
Glucose-Capillary: 214 mg/dL — ABNORMAL HIGH (ref 70–99)

## 2018-04-17 MED ORDER — METHOCARBAMOL 500 MG PO TABS
500.0000 mg | ORAL_TABLET | Freq: Four times a day (QID) | ORAL | Status: DC | PRN
  Administered 2018-04-18: 500 mg via ORAL
  Filled 2018-04-17: qty 1

## 2018-04-17 NOTE — Progress Notes (Signed)
PROGRESS NOTE    [name]  GYK:[id number] DOB: 09/05/1950 DOA: 04/13/2018 PCP: [name], MD    Brief Narrative:  67 year old female with a history of diastolic CHF, atrial fibrillation on anticoagulation, diabetes and chronic kidney disease, who was recently discharged the hospital on 8/27.  At that time, she was advised to go to skilled nursing facility, but had initially wanted to go home.  Upon returning home, patient had a fall while transferring from her wheelchair.  She is suffered bilateral fractures in her lower extremities.  Seen by orthopedics and plan is for nonoperative management.  Seen by physical therapy who recommended skilled nursing facility placement.   Assessment & Plan:   Active Problems:   Diabetes type 2, uncontrolled (HCC)   HTN (hypertension), benign   Left tibial fracture   Unspecified atrial fibrillation (HCC)   Chronic diastolic CHF (congestive heart failure) (HCC)   CKD (chronic kidney disease), stage III (HCC)   Closed right fibular fracture   1. Bilateral lower extremity fractures status post fall.  Seen by orthopedics and management will be nonoperative.  She will be nonweightbearing on both lower extremities.  Continue pain management.  Seen by physical therapy recommended skilled nursing facility placement. Patient is agreeable for placement.  We will add Robaxin for muscle cramping. 2. Chronic diastolic congestive heart failure.  Appears to be euvolemic.  Continued on Lasix. 3. Chronic atrial fibrillation.  Heart rate is currently stable on metoprolol and Cardizem.  Anticoagulated with Eliquis. 4. Left atrial thrombus.  Recently diagnosed, currently on anticoagulation with Eliquis. 5. Type 2 diabetes.  Blood sugars currently stable.  Glipizide on hold.  Continue on sliding scale 6. Chronic kidney disease stage III.  Creatinine is currently at baseline.  We will continue to monitor.   DVT prophylaxis: Eliquis Code Status: Full  code Family Communication: No family present Disposition Plan: Awaiting placement to SNF   Consultants:   Orthopedics  Procedures:     Antimicrobials:       Subjective: Complains of muscle cramping in her legs bilaterally overnight.  Still having pain in legs.  Objective: Vitals:   04/16/18 2126 04/17/18 0541 04/17/18 0802 04/17/18 1351  BP: 122/69 (!) 113/52  120/64  Pulse: (!) 104 72  88  Resp: 18   18  Temp: 98.8 F (37.1 C)   98 F (36.7 C)  TempSrc: Oral     SpO2: 94% 94% 91% 97%  Weight:      Height:        Intake/Output Summary (Last 24 hours) at 04/17/2018 1702 Last data filed at 04/17/2018 1200 Gross per 24 hour  Intake 600 ml  Output 1550 ml  Net -950 ml   Filed Weights   04/15/18 1502  Weight: 120.5 kg    Examination:  General exam: Alert, awake, oriented x 3 Respiratory system: Clear to auscultation. Respiratory effort normal. Cardiovascular system: Irregularly irregular. No murmurs, rubs, gallops. Gastrointestinal system: Abdomen is nondistended, soft and nontender. No organomegaly or masses felt. Normal bowel sounds heard. Central nervous system: Alert and oriented. No focal neurological deficits. Skin: No rashes, lesions or ulcers Psychiatry: Judgement and insight appear normal. Mood & affect appropriate.    Data Reviewed: I have personally reviewed following labs and imaging studies  CBC: Recent Labs  Lab 04/12/18 0542 04/13/18 0350 04/14/18 0120 04/14/18 0514 04/16/18 0652  WBC 10.5 9.3 11.1* 12.3* 12.2*  NEUTROABS  --   --  8.7*  --   --   HGB  9.8* 9.1* 9.1* 9.3* 9.3*  HCT 32.4* 30.2* 31.1* 31.7* 31.6*  MCV 89.5 89.6 89.9 90.8 90.8  PLT 299 265 338 305 031   Basic Metabolic Panel: Recent Labs  Lab 04/13/18 0350 04/14/18 0120 04/14/18 0514 04/16/18 0652 04/17/18 0716  NA 138 141 140 139 138  K 4.6 4.2 4.2 4.1 3.7  CL 98 101 100 98 96*  CO2 24 27 24 26 26   GLUCOSE 160* 221* 222* 112* 196*  BUN 41* 42* 41* 47* 46*   CREATININE 1.86* 1.87* 1.86* 2.18* 1.95*  CALCIUM 8.0* 8.0* 7.9* 7.6* 7.6*   GFR: Estimated Creatinine Clearance: 35.8 mL/min (A) (by C-G formula based on SCr of 1.95 mg/dL (H)). Liver Function Tests: Recent Labs  Lab 04/14/18 0120 04/14/18 0514  AST 57* 59*  ALT 46* 50*  ALKPHOS 115 112  BILITOT 1.4* 1.4*  PROT 6.7 6.6  ALBUMIN 3.1* 3.1*   No results for input(s): LIPASE, AMYLASE in the last 168 hours. No results for input(s): AMMONIA in the last 168 hours. Coagulation Profile: No results for input(s): INR, PROTIME in the last 168 hours. Cardiac Enzymes: No results for input(s): CKTOTAL, CKMB, CKMBINDEX, TROPONINI in the last 168 hours. BNP (last 3 results) No results for input(s): PROBNP in the last 8760 hours. HbA1C: No results for input(s): HGBA1C in the last 72 hours. CBG: Recent Labs  Lab 04/16/18 1641 04/16/18 2143 04/17/18 0716 04/17/18 1106 04/17/18 1602  GLUCAP 74 101* 205* 214* 110*   Lipid Profile: No results for input(s): CHOL, HDL, LDLCALC, TRIG, CHOLHDL, LDLDIRECT in the last 72 hours. Thyroid Function Tests: No results for input(s): TSH, T4TOTAL, FREET4, T3FREE, THYROIDAB in the last 72 hours. Anemia Panel: No results for input(s): VITAMINB12, FOLATE, FERRITIN, TIBC, IRON, RETICCTPCT in the last 72 hours. Sepsis Labs: No results for input(s): PROCALCITON, LATICACIDVEN in the last 168 hours.  No results found for this or any previous visit (from the past 240 hour(s)).       Radiology Studies: No results found.      Scheduled Meds: . apixaban  10 mg Oral BID  . [START ON 04/18/2018] apixaban  5 mg Oral BID  . buPROPion  300 mg Oral Daily  . diltiazem  30 mg Oral Q8H  . fluticasone  2 spray Each Nare Daily  . furosemide  40 mg Oral Daily  . insulin aspart  0-9 Units Subcutaneous TID WC  . insulin aspart  3 Units Subcutaneous TID WC  . loratadine  10 mg Oral Daily  . metoprolol tartrate  150 mg Oral BID  . ondansetron (ZOFRAN) IV  4 mg  Intravenous Q6H  . sodium chloride flush  3 mL Intravenous Q12H  . tiotropium  18 mcg Inhalation Daily   Continuous Infusions: . sodium chloride       LOS: 3 days    Time spent: 45mns    JKathie Dike MD [facility] Pager [phone] If 7PM-7AM, please contact night-coverage [email or web address] Password [email or web address] 04/17/2018, 5:02 PM

## 2018-04-17 NOTE — Progress Notes (Signed)
CSW spoke with RN Lilia Pro concerning insurance auth for patient.  CSW spoke with Gerald Stabs concerning insurance authorization for pt.  The facility will probably have insurance authorization on Monday or Tuesday.  CSW will continue to follow.  Reed Breech LCSWA [phone]

## 2018-04-17 NOTE — Plan of Care (Signed)
  Problem: Activity: Goal: Risk for activity intolerance will decrease Outcome: Progressing   Problem: Nutrition: Goal: Adequate nutrition will be maintained Outcome: Progressing   Problem: Coping: Goal: Level of anxiety will decrease Outcome: Progressing   Problem: Elimination: Goal: Will not experience complications related to bowel motility Outcome: Progressing Goal: Will not experience complications related to urinary retention Outcome: Progressing   Problem: Safety: Goal: Ability to remain free from injury will improve Outcome: Progressing   Problem: Pain Managment: Goal: General experience of comfort will improve Outcome: Not Progressing Note:  Prn pain medication given for bilateral lower leg pain. 8/10

## 2018-04-18 LAB — GLUCOSE, CAPILLARY
GLUCOSE-CAPILLARY: 170 mg/dL — AB (ref 70–99)
GLUCOSE-CAPILLARY: 130 mg/dL — AB (ref 70–99)
Glucose-Capillary: 163 mg/dL — ABNORMAL HIGH (ref 70–99)
Glucose-Capillary: 100 mg/dL — ABNORMAL HIGH (ref 70–99)

## 2018-04-18 LAB — URINALYSIS, ROUTINE W REFLEX MICROSCOPIC
Bilirubin Urine: NEGATIVE
Glucose, UA: NEGATIVE mg/dL
KETONES UR: NEGATIVE mg/dL
NITRITE: NEGATIVE
PH: 5 (ref 5.0–8.0)
Protein, ur: 100 mg/dL — AB
Specific Gravity, Urine: 1.017 (ref 1.005–1.030)

## 2018-04-18 MED ORDER — SODIUM CHLORIDE 0.9 % IV BOLUS
500.0000 mL | Freq: Once | INTRAVENOUS | Status: AC
  Administered 2018-04-18: 500 mL via INTRAVENOUS

## 2018-04-18 MED ORDER — DILTIAZEM HCL 30 MG PO TABS
30.0000 mg | ORAL_TABLET | Freq: Two times a day (BID) | ORAL | Status: DC
  Administered 2018-04-18: 30 mg via ORAL
  Filled 2018-04-18 (×2): qty 1

## 2018-04-18 NOTE — Progress Notes (Signed)
PROGRESS NOTE    [name]  OZH:[id number] DOB: 1951/08/01 DOA: 04/13/2018 PCP: [name], MD    Brief Narrative:  67 year old female with a history of diastolic CHF, atrial fibrillation on anticoagulation, diabetes and chronic kidney disease, who was recently discharged the hospital on 8/27.  At that time, she was advised to go to skilled nursing facility, but had initially wanted to go home.  Upon returning home, patient had a fall while transferring from her wheelchair.  She is suffered bilateral fractures in her lower extremities.  Seen by orthopedics and plan is for nonoperative management.  Seen by physical therapy who recommended skilled nursing facility placement.   Assessment & Plan:   Active Problems:   Diabetes type 2, uncontrolled (HCC)   HTN (hypertension), benign   Left tibial fracture   Unspecified atrial fibrillation (HCC)   Chronic diastolic CHF (congestive heart failure) (HCC)   CKD (chronic kidney disease), stage III (HCC)   Closed right fibular fracture   1. Bilateral lower extremity fractures status post fall.  Seen by orthopedics and management will be nonoperative.  She will be nonweightbearing on both lower extremities.  Continue pain management.  Seen by physical therapy recommended skilled nursing facility placement. Patient is agreeable for placement.  2. Chronic diastolic congestive heart failure.  Appears to be euvolemic.  Continued on Lasix. 3. Chronic atrial fibrillation.  Currently on Cardizem and metoprolol.  Having episodes of hypotension today.  Will decrease Cardizem dosing.  Monitor on telemetry..  Anticoagulated with Eliquis. 4. Left atrial thrombus.  Recently diagnosed, currently on anticoagulation with Eliquis. 5. Type 2 diabetes.  Blood sugars currently stable.  Glipizide on hold.  Continue on sliding scale 6. Chronic kidney disease stage III.  Creatinine is currently at baseline.  We will continue to monitor. 7. Usual  hallucination.  Patient was seen "bugs on the wall.  May be related to Robaxin that was started recently.  Will check urinalysis. 8. Hypotension.  Resolved with IV fluids.  Monitor volume status.   DVT prophylaxis: Eliquis Code Status: Full code Family Communication: No family present Disposition Plan: Awaiting placement to SNF   Consultants:   Orthopedics  Procedures:     Antimicrobials:       Subjective: Leg cramps are doing better today.  No new complaints today.  Staff reported later patient was seen "bugs on the wall".  She was also having episodes of hypotension.  This resolved after receiving IV fluids.  Objective: Vitals:   04/18/18 0536 04/18/18 1342 04/18/18 1400 04/18/18 1457  BP: 106/75 (!) 79/67 (!) 80/52 119/80  Pulse: 74 87  (!) 118  Resp: 20 18  18   Temp: (!) 97.5 F (36.4 C) 98 F (36.7 C)    TempSrc: Oral     SpO2: 99% 100%  100%  Weight:      Height:        Intake/Output Summary (Last 24 hours) at 04/18/2018 1715 Last data filed at 04/18/2018 1502 Gross per 24 hour  Intake 1340 ml  Output 750 ml  Net 590 ml   Filed Weights   04/15/18 1502  Weight: 120.5 kg    Examination:  General exam: Alert, awake, oriented x 3 Respiratory system: Clear to auscultation. Respiratory effort normal. Cardiovascular system: Irregularly irregular. No murmurs, rubs, gallops. Gastrointestinal system: Abdomen is nondistended, soft and nontender. No organomegaly or masses felt. Normal bowel sounds heard. Central nervous system: Alert and oriented. No focal neurological deficits. Skin: No rashes, lesions or ulcers  Psychiatry: Judgement and insight appear normal. Mood & affect appropriate.    Data Reviewed: I have personally reviewed following labs and imaging studies  CBC: Recent Labs  Lab 04/12/18 0542 04/13/18 0350 04/14/18 0120 04/14/18 0514 04/16/18 0652  WBC 10.5 9.3 11.1* 12.3* 12.2*  NEUTROABS  --   --  8.7*  --   --   HGB 9.8* 9.1* 9.1* 9.3*  9.3*  HCT 32.4* 30.2* 31.1* 31.7* 31.6*  MCV 89.5 89.6 89.9 90.8 90.8  PLT 299 265 338 305 001   Basic Metabolic Panel: Recent Labs  Lab 04/13/18 0350 04/14/18 0120 04/14/18 0514 04/16/18 0652 04/17/18 0716  NA 138 141 140 139 138  K 4.6 4.2 4.2 4.1 3.7  CL 98 101 100 98 96*  CO2 24 27 24 26 26   GLUCOSE 160* 221* 222* 112* 196*  BUN 41* 42* 41* 47* 46*  CREATININE 1.86* 1.87* 1.86* 2.18* 1.95*  CALCIUM 8.0* 8.0* 7.9* 7.6* 7.6*   GFR: Estimated Creatinine Clearance: 35.8 mL/min (A) (by C-G formula based on SCr of 1.95 mg/dL (H)). Liver Function Tests: Recent Labs  Lab 04/14/18 0120 04/14/18 0514  AST 57* 59*  ALT 46* 50*  ALKPHOS 115 112  BILITOT 1.4* 1.4*  PROT 6.7 6.6  ALBUMIN 3.1* 3.1*   No results for input(s): LIPASE, AMYLASE in the last 168 hours. No results for input(s): AMMONIA in the last 168 hours. Coagulation Profile: No results for input(s): INR, PROTIME in the last 168 hours. Cardiac Enzymes: No results for input(s): CKTOTAL, CKMB, CKMBINDEX, TROPONINI in the last 168 hours. BNP (last 3 results) No results for input(s): PROBNP in the last 8760 hours. HbA1C: No results for input(s): HGBA1C in the last 72 hours. CBG: Recent Labs  Lab 04/17/18 1602 04/17/18 2131 04/18/18 0709 04/18/18 1114 04/18/18 1606  GLUCAP 110* 161* 170* 163* 100*   Lipid Profile: No results for input(s): CHOL, HDL, LDLCALC, TRIG, CHOLHDL, LDLDIRECT in the last 72 hours. Thyroid Function Tests: No results for input(s): TSH, T4TOTAL, FREET4, T3FREE, THYROIDAB in the last 72 hours. Anemia Panel: No results for input(s): VITAMINB12, FOLATE, FERRITIN, TIBC, IRON, RETICCTPCT in the last 72 hours. Sepsis Labs: No results for input(s): PROCALCITON, LATICACIDVEN in the last 168 hours.  No results found for this or any previous visit (from the past 240 hour(s)).       Radiology Studies: No results found.      Scheduled Meds: . apixaban  5 mg Oral BID  . buPROPion   300 mg Oral Daily  . diltiazem  30 mg Oral Q12H  . fluticasone  2 spray Each Nare Daily  . furosemide  40 mg Oral Daily  . insulin aspart  0-9 Units Subcutaneous TID WC  . insulin aspart  3 Units Subcutaneous TID WC  . loratadine  10 mg Oral Daily  . metoprolol tartrate  150 mg Oral BID  . ondansetron (ZOFRAN) IV  4 mg Intravenous Q6H  . sodium chloride flush  3 mL Intravenous Q12H  . tiotropium  18 mcg Inhalation Daily   Continuous Infusions: . sodium chloride       LOS: 4 days    Time spent: 64mns    JKathie Dike MD [facility] Pager [phone] If 7PM-7AM, please contact night-coverage [email or web address] Password [email or web address] 04/18/2018, 5:15 PM

## 2018-04-19 LAB — BASIC METABOLIC PANEL
ANION GAP: 13 (ref 5–15)
BUN: 42 mg/dL — ABNORMAL HIGH (ref 8–23)
CHLORIDE: 98 mmol/L (ref 98–111)
CO2: 26 mmol/L (ref 22–32)
CREATININE: 1.67 mg/dL — AB (ref 0.44–1.00)
Calcium: 8 mg/dL — ABNORMAL LOW (ref 8.9–10.3)
GFR calc Af Amer: 36 mL/min — ABNORMAL LOW (ref >60)
GFR calc non Af Amer: 31 mL/min — ABNORMAL LOW (ref >60)
Glucose, Bld: 180 mg/dL — ABNORMAL HIGH (ref 70–99)
Potassium: 4.2 mmol/L (ref 3.5–5.1)
Sodium: 137 mmol/L (ref 135–145)

## 2018-04-19 LAB — GLUCOSE, CAPILLARY
GLUCOSE-CAPILLARY: 196 mg/dL — AB (ref 70–99)
GLUCOSE-CAPILLARY: 159 mg/dL — AB (ref 70–99)
GLUCOSE-CAPILLARY: 69 mg/dL — AB (ref 70–99)
Glucose-Capillary: 191 mg/dL — ABNORMAL HIGH (ref 70–99)
Glucose-Capillary: 123 mg/dL — ABNORMAL HIGH (ref 70–99)

## 2018-04-19 LAB — CBC
HCT: 33.7 % — ABNORMAL LOW (ref 36.0–46.0)
Hemoglobin: 9.7 g/dL — ABNORMAL LOW (ref 12.0–15.0)
MCH: 26.2 pg (ref 26.0–34.0)
MCHC: 28.8 g/dL — ABNORMAL LOW (ref 30.0–36.0)
MCV: 91.1 fL (ref 78.0–100.0)
PLATELETS: 350 10*3/uL (ref 150–400)
RBC: 3.7 MIL/uL — AB (ref 3.87–5.11)
RDW: 16.5 % — ABNORMAL HIGH (ref 11.5–15.5)
WBC: 10.3 10*3/uL (ref 4.0–10.5)

## 2018-04-19 MED ORDER — DILTIAZEM HCL 30 MG PO TABS
30.0000 mg | ORAL_TABLET | Freq: Three times a day (TID) | ORAL | Status: DC
  Administered 2018-04-19 – 2018-04-27 (×24): 30 mg via ORAL
  Filled 2018-04-19 (×23): qty 1

## 2018-04-19 MED ORDER — SODIUM CHLORIDE 0.9 % IV BOLUS
500.0000 mL | Freq: Once | INTRAVENOUS | Status: AC
  Administered 2018-04-19: 500 mL via INTRAVENOUS

## 2018-04-19 MED ORDER — DILTIAZEM HCL 30 MG PO TABS: 30.0000 mg | ORAL_TABLET | Freq: Three times a day (TID) | ORAL | Status: DC

## 2018-04-19 MED ORDER — OXYCODONE-ACETAMINOPHEN 5-325 MG PO TABS: 1.0000 | ORAL_TABLET | Freq: Four times a day (QID) | ORAL | 0 refills | Status: DC | PRN

## 2018-04-19 NOTE — Discharge Summary (Signed)
Physician Discharge Summary  [name]:[id number] DOB: July 27, 1951 DOA: 04/13/2018  PCP: [name], MD  Admit date: 04/13/2018 Discharge date: 04/20/2018  Admitted From: Home Disposition: Skilled nursing facility  Recommendations for Outpatient Follow-up:  1. Follow up with PCP in 1-2 weeks 2. Please obtain BMP/CBC in one week 3. Follow-up with orthopedic surgery in the next 6 weeks.  She is nonweightbearing lower extremities  Discharge Condition: Stable CODE STATUS: Full code Diet recommendation: Heart healthy, carb modified  Brief/Interim Summary: 67 year old female with a history of diastolic CHF, atrial fibrillation on anticoagulation, diabetes and chronic kidney disease, who was recently discharged the hospital on 8/27.  At that time, she was advised to go to skilled nursing facility, but had initially wanted to go home.  Upon returning home, patient had a fall while transferring from her wheelchair.  She is suffered bilateral fractures in her lower extremities.  Seen by orthopedics and plan is for nonoperative management.  Seen by physical therapy who recommended skilled nursing facility placement.  Discharge Diagnoses:  Active Problems:   Diabetes type 2, uncontrolled (HCC)   HTN (hypertension), benign   Left tibial fracture   Unspecified atrial fibrillation (HCC)   Chronic diastolic CHF (congestive heart failure) (HCC)   CKD (chronic kidney disease), stage III (HCC)   Closed right fibular fracture  1. Bilateral lower extremity fractures status post fall.  Seen by orthopedics and management will be nonoperative.  She will be nonweightbearing on both lower extremities.  Continue pain management.    Right ankle bimalleolar fracture will be treated with a cam walker.  Left proximal tibia fracture will be treated with immobilization within the immobilizer.  Seen by physical therapy recommended skilled nursing facility placement. Patient is agreeable for placement.   2. Chronic diastolic congestive heart failure.  Appears to be euvolemic.  Continued on Lasix. 3. Chronic atrial fibrillation.  Currently on Cardizem and metoprolol.  Heart rate is stable.  Monitor on telemetry..  Anticoagulated with Eliquis. 4. Left atrial thrombus.  Recently diagnosed, currently on anticoagulation with Eliquis. 5. Type 2 diabetes.  Blood sugars currently stable.    Resume glipizide on discharge. 6. Chronic kidney disease stage III.  Creatinine is currently at baseline.  We will continue to monitor. 7. Visual hallucinations.  Patient was seeing "bugs on the wall".  Urinalysis without signs of infection. Robaxin discontinued. Patient reports no further hallucinations  Discharge Instructions   Allergies as of 04/19/2018      Reactions   Latex Itching, Rash      Medication List    TAKE these medications   acetaminophen 500 MG tablet Commonly known as:  TYLENOL Take 1,500-2,000 mg by mouth every 6 (six) hours as needed for mild pain or headache.   apixaban 5 MG Tabs tablet Commonly known as:  ELIQUIS Take 1 tablet (5 mg total) by mouth 2 (two) times daily. What changed:  Another medication with the same name was removed. Continue taking this medication, and follow the directions you see here.   buPROPion 300 MG 24 hr tablet Commonly known as:  WELLBUTRIN XL Take 300 mg by mouth daily.   cetirizine 10 MG tablet Commonly known as:  ZYRTEC Take 10 mg by mouth daily as needed for allergies.   diltiazem 30 MG tablet Commonly known as:  CARDIZEM Take 1 tablet (30 mg total) by mouth every 8 (eight) hours.   fluticasone 50 MCG/ACT nasal spray Commonly known as:  FLONASE Place 2 sprays into both nostrils daily.  furosemide 40 MG tablet Commonly known as:  LASIX Take 1 tablet (40 mg total) by mouth daily.   glipiZIDE 10 MG tablet Commonly known as:  GLUCOTROL Take 10 mg by mouth 2 (two) times daily.   Metoprolol Tartrate 75 MG Tabs Take 150 mg by mouth 2 (two)  times daily.   oxyCODONE-acetaminophen 5-325 MG tablet Commonly known as:  PERCOCET/ROXICET Take 1 tablet by mouth every 6 (six) hours as needed for moderate pain.   SPIRIVA RESPIMAT 1.25 MCG/ACT Aers Generic drug:  Tiotropium Bromide Monohydrate Inhale 2 sprays into the lungs daily.      Contact information for after-discharge care    [facility] .   Service:  Skilled Nursing Contact information: [address] [address] [phone]             Allergies  Allergen Reactions  . Latex Itching and Rash    Consultations:  Orthopedic surgery   Procedures/Studies: Dg Chest 2 View  Result Date: 04/10/2018 CLINICAL DATA:  Recent diagnosis of atrial fibrillation. Chest pain and shortness of breath. EXAM: CHEST - 2 VIEW COMPARISON:  April 01, 2018 FINDINGS: Stable cardiomegaly. The hila and mediastinum are unchanged. No pneumothorax. No pulmonary nodules or masses. No focal infiltrates. No overt edema. IMPRESSION: Cardiomegaly without overt edema. Electronically Signed   By: Dorise Bullion III M.D   On: 04/10/2018 14:00   Dg Chest 2 View  Result Date: 04/01/2018 CLINICAL DATA:  Shortness of breath EXAM: CHEST - 2 VIEW COMPARISON:  03/29/2018, 02/19/2016 FINDINGS: Mild cardiomegaly with vascular congestion and mild diffuse interstitial opacities suggesting minimal edema. No pleural effusion. Possible small anterior lower lobe infiltrate on the lateral view. Aortic atherosclerosis. No pneumothorax. IMPRESSION: 1. Cardiomegaly with vascular congestion and mild interstitial edema 2. Possible small anterior lung base infiltrate on the lateral view. Electronically Signed   By: Donavan Foil M.D.   On: 04/01/2018 18:48   Dg Chest 2 View  Result Date: 03/29/2018 CLINICAL DATA:  Acute shortness of breath with cough for 1 week. EXAM: CHEST - 2 VIEW COMPARISON:  02/19/2016 FINDINGS: Cardiomegaly and pulmonary vascular  congestion noted. There is no evidence of focal airspace disease,, pulmonary edema, suspicious pulmonary nodule/mass, pleural effusion, or pneumothorax. No acute bony abnormalities are identified. IMPRESSION: Cardiomegaly with pulmonary vascular congestion. Electronically Signed   By: Margarette Canada M.D.   On: 03/29/2018 14:16   Dg Tibia/fibula Left  Result Date: 04/14/2018 CLINICAL DATA:  Fall.  Left lower leg pain EXAM: LEFT TIBIA AND FIBULA - 2 VIEW COMPARISON:  02/19/2016 FINDINGS: There is a fracture through the the proximal left tibial metaphysis and proximal shaft. Proximal fibular fracture also likely present. Hardware noted in the distal femur. Advanced degenerative changes in the left knee. IMPRESSION: Minimally displaced proximal left tibial and fibular fractures. Electronically Signed   By: Rolm Baptise M.D.   On: 04/14/2018 00:35   Dg Ankle Complete Right  Result Date: 04/14/2018 CLINICAL DATA:  Fall, right ankle pain EXAM: RIGHT ANKLE - COMPLETE 3+ VIEW COMPARISON:  None. FINDINGS: Fractures are noted in the distal fibular metaphysis and base of medial malleolus. Minimal displacement. Ankle mortise is intact. Diffuse vascular calcifications. Soft tissue calcifications and soft tissue swelling. IMPRESSION: Distal fibular metaphyseal and medial malleolar fractures. Electronically Signed   By: Rolm Baptise M.D.   On: 04/14/2018 00:37   US Renal  Result Date: 04/05/2018 CLINICAL DATA:  Acute renal injury EXAM: RENAL /  URINARY TRACT ULTRASOUND COMPLETE COMPARISON:  None. FINDINGS: Right Kidney: Length: 8.9 cm. Echogenicity within normal limits. No mass or hydronephrosis visualized. Left Kidney: Length: 10.3 cm. Echogenicity within normal limits. No mass or hydronephrosis visualized. Bladder: Appears normal for degree of bladder distention. IMPRESSION: Difficult visualization of the kidneys due to habitus. No hydronephrosis. Electronically Signed   By: Donavan Foil M.D.   On: 04/05/2018 00:50    Dg Chest Port 1 View  Result Date: 04/14/2018 CLINICAL DATA:  67 year old female with a history of hypoxia EXAM: PORTABLE CHEST 1 VIEW COMPARISON:  04/10/2018 FINDINGS: Cardiomediastinal silhouette unchanged in size and contour. No evidence of central vascular congestion. No pneumothorax or pleural effusion. Low lung volumes. Similar appearance of coarsened interstitial markings. No confluent airspace disease. No displaced fracture. IMPRESSION: No evidence of acute cardiopulmonary disease. Electronically Signed   By: Corrie Mckusick D.O.   On: 04/14/2018 14:13   Dg Hip Unilat W Or Wo Pelvis 2-3 Views Left  Result Date: 04/14/2018 CLINICAL DATA:  Fall, left leg pain EXAM: DG HIP (WITH OR WITHOUT PELVIS) 2-3V LEFT COMPARISON:  None. FINDINGS: Hardware noted within the left femur with intramedullary nail. Mild degenerative changes in the hips bilaterally. SI joints are symmetric and unremarkable. No acute bony abnormality. Specifically, no fracture, subluxation, or dislocation. IMPRESSION: No acute bony abnormality. Electronically Signed   By: Rolm Baptise M.D.   On: 04/14/2018 00:36       Subjective: Feeling better today.  Pain is reasonably managed.  Discharge Exam: Vitals:   04/19/18 0531 04/19/18 0807 04/19/18 1504 04/19/18 2003  BP: 135/60  (!) 145/80   Pulse: 82  79   Resp: 19  15   Temp: (!) 97.5 F (36.4 C)  98.9 F (37.2 C)   TempSrc: Oral  Axillary   SpO2: 97% 98% 98% 95%  Weight:      Height:        General: Pt is alert, awake, not in acute distress Cardiovascular: Irregularly irregular, S1/S2 +, no rubs, no gallops Respiratory: CTA bilaterally, no wheezing, no rhonchi Abdominal: Soft, NT, ND, bowel sounds + Extremities: Bilateral lower extremity are in braces.    The results of significant diagnostics from this hospitalization (including imaging, microbiology, ancillary and laboratory) are listed below for reference.     Microbiology: No results found for this or  any previous visit (from the past 240 hour(s)).   Labs: BNP (last 3 results) Recent Labs    04/10/18 1315  BNP 5,638.9*   Basic Metabolic Panel: Recent Labs  Lab 04/14/18 0120 04/14/18 0514 04/16/18 0652 04/17/18 0716 04/19/18 0629  NA 141 140 139 138 137  K 4.2 4.2 4.1 3.7 4.2  CL 101 100 98 96* 98  CO2 27 24 26 26 26   GLUCOSE 221* 222* 112* 196* 180*  BUN 42* 41* 47* 46* 42*  CREATININE 1.87* 1.86* 2.18* 1.95* 1.67*  CALCIUM 8.0* 7.9* 7.6* 7.6* 8.0*   Liver Function Tests: Recent Labs  Lab 04/14/18 0120 04/14/18 0514  AST 57* 59*  ALT 46* 50*  ALKPHOS 115 112  BILITOT 1.4* 1.4*  PROT 6.7 6.6  ALBUMIN 3.1* 3.1*   No results for input(s): LIPASE, AMYLASE in the last 168 hours. No results for input(s): AMMONIA in the last 168 hours. CBC: Recent Labs  Lab 04/13/18 0350 04/14/18 0120 04/14/18 0514 04/16/18 0652 04/19/18 0629  WBC 9.3 11.1* 12.3* 12.2* 10.3  NEUTROABS  --  8.7*  --   --   --  HGB 9.1* 9.1* 9.3* 9.3* 9.7*  HCT 30.2* 31.1* 31.7* 31.6* 33.7*  MCV 89.6 89.9 90.8 90.8 91.1  PLT 265 338 305 333 350   Cardiac Enzymes: No results for input(s): CKTOTAL, CKMB, CKMBINDEX, TROPONINI in the last 168 hours. BNP: Invalid input(s): POCBNP CBG: Recent Labs  Lab 04/18/18 1606 04/18/18 2123 04/19/18 0750 04/19/18 1123 04/19/18 1712  GLUCAP 100* 130* 191* 196* 159*   D-Dimer No results for input(s): DDIMER in the last 72 hours. Hgb A1c No results for input(s): HGBA1C in the last 72 hours. Lipid Profile No results for input(s): CHOL, HDL, LDLCALC, TRIG, CHOLHDL, LDLDIRECT in the last 72 hours. Thyroid function studies No results for input(s): TSH, T4TOTAL, T3FREE, THYROIDAB in the last 72 hours.  Invalid input(s): FREET3 Anemia work up No results for input(s): VITAMINB12, FOLATE, FERRITIN, TIBC, IRON, RETICCTPCT in the last 72 hours. Urinalysis    Component Value Date/Time   COLORURINE YELLOW 04/18/2018 1522   APPEARANCEUR HAZY (A)  04/18/2018 1522   LABSPEC 1.017 04/18/2018 1522   PHURINE 5.0 04/18/2018 1522   GLUCOSEU NEGATIVE 04/18/2018 1522   HGBUR LARGE (A) 04/18/2018 1522   BILIRUBINUR NEGATIVE 04/18/2018 1522   KETONESUR NEGATIVE 04/18/2018 1522   PROTEINUR 100 (A) 04/18/2018 1522   UROBILINOGEN 0.2 12/01/2007 1025   NITRITE NEGATIVE 04/18/2018 1522   LEUKOCYTESUR MODERATE (A) 04/18/2018 1522   Sepsis Labs Invalid input(s): PROCALCITONIN,  WBC,  LACTICIDVEN Microbiology No results found for this or any previous visit (from the past 240 hour(s)).   Time coordinating discharge: 46mns  SIGNED:   JKathie Dike MD  [facility] 04/19/2018, 8:50 PM Pager   If 7PM-7AM, please contact night-coverage [email or web address] Password [email or web address]

## 2018-04-19 NOTE — Clinical Social Work Note (Signed)
CSW following. Pt has a bed offer from [facility]. Awaiting insurance authorization. Unlikely this will be received today due to the holiday. MD aware. Will follow up tomorrow for further assistance with dc planning.

## 2018-04-19 NOTE — Progress Notes (Signed)
PROGRESS NOTE    [name]  IRW:[id number] DOB: Nov 04, 1950 DOA: 04/13/2018 PCP: [name], MD    Brief Narrative:  67 year old female with a history of diastolic CHF, atrial fibrillation on anticoagulation, diabetes and chronic kidney disease, who was recently discharged the hospital on 8/27.  At that time, she was advised to go to skilled nursing facility, but had initially wanted to go home.  Upon returning home, patient had a fall while transferring from her wheelchair.  She is suffered bilateral fractures in her lower extremities.  Seen by orthopedics and plan is for nonoperative management.  Seen by physical therapy who recommended skilled nursing facility placement.   Assessment & Plan:   Active Problems:   Diabetes type 2, uncontrolled (HCC)   HTN (hypertension), benign   Left tibial fracture   Unspecified atrial fibrillation (HCC)   Chronic diastolic CHF (congestive heart failure) (HCC)   CKD (chronic kidney disease), stage III (HCC)   Closed right fibular fracture   1. Bilateral lower extremity fractures status post fall.  Seen by orthopedics and management will be nonoperative.  She will be nonweightbearing on both lower extremities.  Continue pain management.  Seen by physical therapy recommended skilled nursing facility placement. Patient is agreeable for placement.  2. Chronic diastolic congestive heart failure.  Appears to be euvolemic.  Continued on Lasix. 3. Chronic atrial fibrillation.  Currently on Cardizem and metoprolol.  Heart rate is stable.  Monitor on telemetry..  Anticoagulated with Eliquis. 4. Left atrial thrombus.  Recently diagnosed, currently on anticoagulation with Eliquis. 5. Type 2 diabetes.  Blood sugars currently stable.  Glipizide on hold.  Continue on sliding scale 6. Chronic kidney disease stage III.  Creatinine is currently at baseline.  We will continue to monitor. 7. Visual hallucinations.  Patient was seen "bugs on the wall".   Urinalysis without signs of infection. Robaxin discontinued. Patient reports no further hallucinations 8. Hypotension.  Resolved with IV fluids.  Monitor volume status.   DVT prophylaxis: Eliquis Code Status: Full code Family Communication: No family present Disposition Plan: Awaiting placement to SNF   Consultants:   Orthopedics  Procedures:     Antimicrobials:       Subjective: Feeling better today. No longer having any visual hallucinations. Pain is controlled.  Objective: Vitals:   04/18/18 2122 04/19/18 0531 04/19/18 0807 04/19/18 1504  BP: 122/61 135/60  (!) 145/80  Pulse: 79 82  79  Resp: 18 19  15   Temp: (!) 97.5 F (36.4 C) (!) 97.5 F (36.4 C)  98.9 F (37.2 C)  TempSrc:  Oral  Axillary  SpO2: 100% 97% 98% 98%  Weight:      Height:        Intake/Output Summary (Last 24 hours) at 04/19/2018 1630 Last data filed at 04/19/2018 1355 Gross per 24 hour  Intake 1340 ml  Output 750 ml  Net 590 ml   Filed Weights   04/15/18 1502  Weight: 120.5 kg    Examination:  General exam: Alert, awake, oriented x 3 Respiratory system: Clear to auscultation. Respiratory effort normal. Cardiovascular system: Irregularly irregular. No murmurs, rubs, gallops. Gastrointestinal system: Abdomen is nondistended, soft and nontender. No organomegaly or masses felt. Normal bowel sounds heard. Central nervous system: Alert and oriented. No focal neurological deficits. Skin: No rashes, lesions or ulcers Psychiatry: Judgement and insight appear normal. Mood & affect appropriate.    Data Reviewed: I have personally reviewed following labs and imaging studies  CBC: Recent Labs  Lab  04/13/18 0350 04/14/18 0120 04/14/18 0514 04/16/18 0652 04/19/18 0629  WBC 9.3 11.1* 12.3* 12.2* 10.3  NEUTROABS  --  8.7*  --   --   --   HGB 9.1* 9.1* 9.3* 9.3* 9.7*  HCT 30.2* 31.1* 31.7* 31.6* 33.7*  MCV 89.6 89.9 90.8 90.8 91.1  PLT 265 338 305 333 859   Basic Metabolic  Panel: Recent Labs  Lab 04/14/18 0120 04/14/18 0514 04/16/18 0652 04/17/18 0716 04/19/18 0629  NA 141 140 139 138 137  K 4.2 4.2 4.1 3.7 4.2  CL 101 100 98 96* 98  CO2 27 24 26 26 26   GLUCOSE 221* 222* 112* 196* 180*  BUN 42* 41* 47* 46* 42*  CREATININE 1.87* 1.86* 2.18* 1.95* 1.67*  CALCIUM 8.0* 7.9* 7.6* 7.6* 8.0*   GFR: Estimated Creatinine Clearance: 41.8 mL/min (A) (by C-G formula based on SCr of 1.67 mg/dL (H)). Liver Function Tests: Recent Labs  Lab 04/14/18 0120 04/14/18 0514  AST 57* 59*  ALT 46* 50*  ALKPHOS 115 112  BILITOT 1.4* 1.4*  PROT 6.7 6.6  ALBUMIN 3.1* 3.1*   No results for input(s): LIPASE, AMYLASE in the last 168 hours. No results for input(s): AMMONIA in the last 168 hours. Coagulation Profile: No results for input(s): INR, PROTIME in the last 168 hours. Cardiac Enzymes: No results for input(s): CKTOTAL, CKMB, CKMBINDEX, TROPONINI in the last 168 hours. BNP (last 3 results) No results for input(s): PROBNP in the last 8760 hours. HbA1C: No results for input(s): HGBA1C in the last 72 hours. CBG: Recent Labs  Lab 04/18/18 1114 04/18/18 1606 04/18/18 2123 04/19/18 0750 04/19/18 1123  GLUCAP 163* 100* 130* 191* 196*   Lipid Profile: No results for input(s): CHOL, HDL, LDLCALC, TRIG, CHOLHDL, LDLDIRECT in the last 72 hours. Thyroid Function Tests: No results for input(s): TSH, T4TOTAL, FREET4, T3FREE, THYROIDAB in the last 72 hours. Anemia Panel: No results for input(s): VITAMINB12, FOLATE, FERRITIN, TIBC, IRON, RETICCTPCT in the last 72 hours. Sepsis Labs: No results for input(s): PROCALCITON, LATICACIDVEN in the last 168 hours.  No results found for this or any previous visit (from the past 240 hour(s)).       Radiology Studies: No results found.      Scheduled Meds: . apixaban  5 mg Oral BID  . buPROPion  300 mg Oral Daily  . diltiazem  30 mg Oral Q8H  . fluticasone  2 spray Each Nare Daily  . furosemide  40 mg Oral  Daily  . insulin aspart  0-9 Units Subcutaneous TID WC  . insulin aspart  3 Units Subcutaneous TID WC  . loratadine  10 mg Oral Daily  . metoprolol tartrate  150 mg Oral BID  . ondansetron (ZOFRAN) IV  4 mg Intravenous Q6H  . sodium chloride flush  3 mL Intravenous Q12H  . tiotropium  18 mcg Inhalation Daily   Continuous Infusions: . sodium chloride       LOS: 5 days    Time spent: 30mns    JKathie Dike MD [facility] Pager [phone] If 7PM-7AM, please contact night-coverage [email or web address] Password [email or web address] 04/19/2018, 4:30 PM

## 2018-04-19 NOTE — Progress Notes (Signed)
Physical Therapy Treatment Patient Details Name: [name] MRN: [id number] DOB: [birth year] Today's Date: 04/19/2018    History of Present Illness [name]  is a 67 y.o. female, with history of chronic diastolic CHF, atrial fibrillation on anticoagulation with Eliquis, left atrial thrombus, type 2 diabetes mellitus, chronic kidney disease stage III hypertension, anemia who was just discharged from the hospital on 04/13/2018.  Patient at home was trying to transfer herself from wheelchair to left ear and she missed the left.  And injured left lower leg and right ankle.  Patient was brought to hospital.  She denies passing out.  No dizziness or blurred vision.  No chest pain or shortness of breath.  No nausea vomiting or diarrhea.  No abdominal pain.  No dysuria, urgency or frequency of urination.    PT Comments    Patient demonstrates increased RLE strength for movement during bed mobility, good sitting balance while seated at bedside while completing exercises, required Mod/max assist for scooting forward at bedside, but unable to scoot laterally due to BLE weakness and tolerated sitting up at bedside for approximately 30 minutes before requesting to lie down due to fatigue.  Patient had much difficulty going from sitting to supine due to severe left knee pain and required Max/total assist to reposition in bed.  Patient will benefit from continued physical therapy in hospital and recommended venue below to increase strength, balance, endurance for safe ADLs and gait.   Follow Up Recommendations  SNF     Equipment Recommendations  None recommended by PT    Recommendations for Other Services       Precautions / Restrictions Precautions Precautions: Fall Required Braces or Orthoses: Knee Immobilizer - Left;Other Brace/Splint Knee Immobilizer - Left: On at all times Other Brace/Splint: cam boot Right ankle Restrictions LLE Weight Bearing: Non weight bearing Other Position/Activity  Restrictions: NWB BLE's    Mobility  Bed Mobility Overal bed mobility: Needs Assistance Bed Mobility: Supine to Sit;Sit to Supine     Supine to sit: Mod assist Sit to supine: Max assist   General bed mobility comments: demonstrates improvement for moving LLE during supine to sit with less pain left knee  Transfers                    Ambulation/Gait                 Stairs             Wheelchair Mobility    Modified Rankin (Stroke Patients Only)       Balance Overall balance assessment: Needs assistance Sitting-balance support: Feet supported;No upper extremity supported Sitting balance-Leahy Scale: Good Sitting balance - Comments: good tolerance for maintaining sitting balance wiith no BUE support                                    Cognition Arousal/Alertness: Awake/alert Behavior During Therapy: WFL for tasks assessed/performed Overall Cognitive Status: Within Functional Limits for tasks assessed                                        Exercises General Exercises - Lower Extremity Ankle Circles/Pumps: Seated;AROM;Both;10 reps;Strengthening Quad Sets: Seated;AROM;Both;10 reps;Strengthening Gluteal Sets: Seated;AROM;Strengthening;Both;10 reps Other Exercises Other Exercises: seated active assisted straight leg raises (SLR) x 10 reps each Other Exercises:  seated bilateral triceps depressions pushing down on pillows x 10 reps     General Comments        Pertinent Vitals/Pain Pain Assessment: Faces Faces Pain Scale: Hurts whole lot Pain Location: with movement of left knee during sitting to supine Pain Descriptors / Indicators: Grimacing;Guarding;Moaning;Sharp Pain Intervention(s): Limited activity within patient's tolerance;Monitored during session;Premedicated before session    Home Living                      Prior Function            PT Goals (current goals can now be found in the care plan  section) Acute Rehab PT Goals Patient Stated Goal: return home after rehab PT Goal Formulation: With patient Time For Goal Achievement: 04/29/18 Potential to Achieve Goals: Good Progress towards PT goals: Progressing toward goals    Frequency    Min 3X/week      PT Plan Current plan remains appropriate    Co-evaluation              AM-PAC PT "6 Clicks" Daily Activity  Outcome Measure  Difficulty turning over in bed (including adjusting bedclothes, sheets and blankets)?: A Lot Difficulty moving from lying on back to sitting on the side of the bed? : A Lot Difficulty sitting down on and standing up from a chair with arms (e.g., wheelchair, bedside commode, etc,.)?: Unable Help needed moving to and from a bed to chair (including a wheelchair)?: Total Help needed walking in hospital room?: Total Help needed climbing 3-5 steps with a railing? : Total 6 Click Score: 8    End of Session   Activity Tolerance: Patient tolerated treatment well;Patient limited by fatigue;Patient limited by pain Patient left: in bed;with call bell/phone within reach;with bed alarm set Nurse Communication: Mobility status PT Visit Diagnosis: History of falling (Z91.81);Muscle weakness (generalized) (M62.81);Difficulty in walking, not elsewhere classified (R26.2)     Time: 0822-0900 PT Time Calculation (min) (ACUTE ONLY): 38 min  Charges:  $Therapeutic Exercise: 8-22 mins $Therapeutic Activity: 8-22 mins                     9:14 AM, 04/19/18 Lonell Grandchild, MPT Physical Therapist with [facility] [phone] office [phone] mobile phone

## 2018-04-20 ENCOUNTER — Encounter (HOSPITAL_COMMUNITY): Payer: Self-pay | Admitting: Registered Nurse

## 2018-04-20 LAB — GLUCOSE, CAPILLARY
GLUCOSE-CAPILLARY: 236 mg/dL — AB (ref 70–99)
GLUCOSE-CAPILLARY: 238 mg/dL — AB (ref 70–99)
Glucose-Capillary: 174 mg/dL — ABNORMAL HIGH (ref 70–99)
Glucose-Capillary: 171 mg/dL — ABNORMAL HIGH (ref 70–99)
Glucose-Capillary: 182 mg/dL — ABNORMAL HIGH (ref 70–99)

## 2018-04-20 NOTE — Progress Notes (Signed)
Patient agreed to having telemetry replaced.

## 2018-04-20 NOTE — Progress Notes (Signed)
RN spoke to [employer] and they have the consult.  Will call us when available for assessment.

## 2018-04-20 NOTE — BH Assessment (Signed)
Tele Assessment Note   Patient Name: [name] MRN: [id number] Referring Physician: Florene Glen Location of Patient:  Location of Provider: [place] is a 67 y.o. female. At [facility] med floor and currently waiting transfer to skilled nursing facility. Patient was referred to TTS following a verbal altercation with nurse this morning. Patient's husband was present for assessment. According to RN patient was agitated this morning. She refused insulin and when nurse was performing exam wiped blood on her. Nurse stated she threatened to do it again. Patient admitted to incident but stated that they had "moved on." Patient was pleasant during assessment and was forthcoming with questions asked.  Patient denied SI/HI and has no history of mental illness or substance use. She stated she heard voices but only when coming out of surgery. Patient's husband stated he did not have concerns about her mental health. Shuvon Rankin, NP will assess for psych clearance.   Diagnosis: Deferred   Past Medical History:  Past Medical History:  Diagnosis Date  . Depression   . Diabetes mellitus without complication ([place])   . Hypertension   . Morbid obesity ([place])   . Osteoporosis   . UTI (lower urinary tract infection) 01/2016   Cipro for Klebsiella pneumoniae isolate    Past Surgical History:  Procedure Laterality Date  . ABDOMINAL HYSTERECTOMY    . CESAREAN SECTION    . CHOLECYSTECTOMY    . FEMUR IM NAIL Left 02/20/2016  . FEMUR IM NAIL Left 02/20/2016   Procedure: INTRAMEDULLARY (IM) RETROGRADE FEMORAL NAILING;  Surgeon: Leandrew Koyanagi, MD;  Location: [place];  Service: Orthopedics;  Laterality: Left;  . PANCREAS SURGERY  1967   1 cyst excised and one cyst drained  . TEE WITHOUT CARDIOVERSION N/A 04/05/2018   Procedure: TRANSESOPHAGEAL ECHOCARDIOGRAM (TEE);  Surgeon: Dorothy Spark, MD;  Location: [place] [facility];  Service: Cardiovascular;  Laterality: N/A;  . [place]    . WRIST FRACTURE SURGERY      Family History:  Family History  Problem Relation Age of Onset  . Diabetes Mother        died in her 32's of a stroke  . Cancer Mother   . Stroke Mother   . Breast cancer Mother   . Diabetes Father        died in his 88's of a stroke.  . Stroke Father   . Diabetes Brother        died @ 74 of a stroke.  . Stroke Brother   . Diabetes Maternal Grandmother   . Diabetes Maternal Grandfather   . Diabetes Paternal Grandmother   . Diabetes Paternal Grandfather   . Breast cancer Maternal Aunt     Social History:  reports that she has never smoked. She has never used smokeless tobacco. She reports that she does not drink alcohol or use drugs.  Additional Social History:  Alcohol / Drug Use Pain Medications: see MAR Prescriptions: see MAR Over the Counter: see MAR History of alcohol / drug use?: No history of alcohol / drug abuse  CIWA: CIWA-Ar BP: 129/90 Pulse Rate: 100 COWS:    Allergies:  Allergies  Allergen Reactions  . Latex Itching and Rash    Home Medications:  Medications Prior to Admission  Medication Sig Dispense Refill  . acetaminophen (TYLENOL) 500 MG tablet Take 1,500-2,000 mg by mouth every 6 (six) hours as needed for mild pain or headache.    Marland Kitchen apixaban (ELIQUIS)  5 MG TABS tablet Take 2 tablets (10 mg total) by mouth 2 (two) times daily for 4 days. 16 tablet 0  . buPROPion (WELLBUTRIN XL) 300 MG 24 hr tablet Take 300 mg by mouth daily.    . cetirizine (ZYRTEC) 10 MG tablet Take 10 mg by mouth daily as needed for allergies.   0  . fluticasone (FLONASE) 50 MCG/ACT nasal spray Place 2 sprays into both nostrils daily.    . furosemide (LASIX) 40 MG tablet Take 1 tablet (40 mg total) by mouth daily. 30 tablet 0  . glipiZIDE (GLUCOTROL) 10 MG tablet Take 10 mg by mouth 2 (two) times daily.  2  . metoprolol tartrate 75 MG TABS Take 150 mg by mouth 2 (two) times daily. 60 tablet 1  . SPIRIVA RESPIMAT 1.25 MCG/ACT AERS  Inhale 2 sprays into the lungs daily.  2  . apixaban (ELIQUIS) 5 MG TABS tablet Take 1 tablet (5 mg total) by mouth 2 (two) times daily. 60 tablet 0    OB/GYN Status:  No LMP recorded. Patient has had a hysterectomy.  General Assessment Data Location of Assessment: [facility] TTS Assessment: In system Is this a Tele or Face-to-Face Assessment?: Tele Assessment Is this an Initial Assessment or a Re-assessment for this encounter?: Initial Assessment Patient Accompanied by:: Other(husband) Language Other than English: No Living Arrangements: Other (Comment) What gender do you identify as?: Female Marital status: Married Pregnancy Status: No Living Arrangements: Spouse/significant other Can pt return to current living arrangement?: Yes Admission Status: Voluntary Is patient capable of signing voluntary admission?: Yes Referral Source: MD Insurance type: BCBS     Crisis Care Plan Living Arrangements: Spouse/significant other     Risk to self with the past 6 months Suicidal Ideation: No Has patient been a risk to self within the past 6 months prior to admission? : No Suicidal Intent: No Has patient had any suicidal intent within the past 6 months prior to admission? : No Is patient at risk for suicide?: No Suicidal Plan?: No Has patient had any suicidal plan within the past 6 months prior to admission? : No Access to Means: No What has been your use of drugs/alcohol within the last 12 months?: none reported Previous Attempts/Gestures: No How many times?: 0 Other Self Harm Risks: none reported Triggers for Past Attempts: Other (Comment)(n/a) Intentional Self Injurious Behavior: None Family Suicide History: No Recent stressful life event(s): Trauma (Comment)(2 broken legs) Persecutory voices/beliefs?: No Depression: No Substance abuse history and/or treatment for substance abuse?: No Suicide prevention information given to non-admitted patients: Not  applicable  Risk to Others within the past 6 months Homicidal Ideation: No Does patient have any lifetime risk of violence toward others beyond the six months prior to admission? : No Thoughts of Harm to Others: No Current Homicidal Intent: No Current Homicidal Plan: No Access to Homicidal Means: No History of harm to others?: No Assessment of Violence: None Noted Does patient have access to weapons?: No Criminal Charges Pending?: No Does patient have a court date: No Is patient on probation?: No  Psychosis Hallucinations: None noted Delusions: None noted  Mental Status Report Appearance/Hygiene: In scrubs, Unremarkable Eye Contact: Good Motor Activity: Unremarkable Speech: Logical/coherent Level of Consciousness: Alert Mood: Pleasant Affect: Appropriate to circumstance Anxiety Level: Minimal Thought Processes: Coherent, Relevant Judgement: Unimpaired Orientation: Person, Place, Time, Situation Obsessive Compulsive Thoughts/Behaviors: None  Cognitive Functioning Concentration: Normal Memory: Recent Intact, Remote Intact Is patient IDD: No Insight: Fair Impulse Control: Fair  Appetite: Good Have you had any weight changes? : No Change Sleep: No Change Vegetative Symptoms: None  ADLScreening [facility] Assessment Services) Patient's cognitive ability adequate to safely complete daily activities?: Yes Patient able to express need for assistance with ADLs?: Yes Independently performs ADLs?: No  Prior Inpatient Therapy Prior Inpatient Therapy: No  Prior Outpatient Therapy Prior Outpatient Therapy: No Does patient have an ACCT team?: No Does patient have Intensive In-House Services?  : No Does patient have [facility] services? : No Does patient have P4CC services?: No  ADL Screening (condition at time of admission) Patient's cognitive ability adequate to safely complete daily activities?: Yes Is the patient deaf or have difficulty hearing?: No Does the patient have  difficulty seeing, even when wearing glasses/contacts?: No Does the patient have difficulty concentrating, remembering, or making decisions?: No Patient able to express need for assistance with ADLs?: Yes Does the patient have difficulty dressing or bathing?: No Independently performs ADLs?: No Communication: Independent Dressing (OT): Needs assistance Is this a change from baseline?: Change from baseline, expected to last <3days Grooming: Needs assistance Is this a change from baseline?: Change from baseline, expected to last >3 days Toileting: Independent Is this a change from baseline?: Pre-admission baseline In/Out Bed: Needs assistance Is this a change from baseline?: Change from baseline, expected to last >3 days Walks in Home: Dependent Is this a change from baseline?: Pre-admission baseline Does the patient have difficulty walking or climbing stairs?: Yes Weakness of Legs: Both Weakness of Arms/Hands: None  Home Assistive Devices/Equipment Home Assistive Devices/Equipment: Wheelchair  Therapy Consults (therapy consults require a physician order) PT Evaluation Needed: No OT Evalulation Needed: No SLP Evaluation Needed: No Abuse/Neglect Assessment (Assessment to be complete while patient is alone) Abuse/Neglect Assessment Can Be Completed: Yes Physical Abuse: Denies Verbal Abuse: Denies Sexual Abuse: Denies Exploitation of patient/patient's resources: Denies Self-Neglect: Denies Values / Beliefs Cultural Requests During Hospitalization: None Spiritual Requests During Hospitalization: None Consults Spiritual Care Consult Needed: No Social Work Consult Needed: No Regulatory affairs officer (For Healthcare) Does Patient Have a Medical Advance Directive?: No Would patient like information on creating a medical advance directive?: No - Patient declined Nutrition Screen- [facility] Adult/[facility] Has the patient recently lost weight without trying?: No Has the patient been eating poorly  because of a decreased appetite?: No Malnutrition Screening Tool Score: 0        Disposition:  Disposition Initial Assessment Completed for this Encounter: Yes  This service was provided via telemedicine using a 2-way, interactive audio and video technology.     Orvis Brill 04/20/2018 2:02 PM

## 2018-04-20 NOTE — Consult Note (Signed)
[name], 67 y.o., female patient seen via telepsych by TTS and this provider; chart reviewed and consulted with Dr. Dwyane Dee on 04/20/18.  On evaluation [place] reports during an verbal altercation with nurse she was mad and upset when she wiped blood on to her nurses uniform.  States since the incident she has talked to nurse and explained and apologized for her actions.  Patient denies suicidal/self-harm/homicidal ideation, psychosis, and paranoia.  Patient also denies any altered mental status.   During evaluation [name] is alert/oriented x 4; calm/cooperative with pleasant affect.  She does not appear to be responding to internal/external stimuli or delusional thoughts.  Patient denies suicidal/self-harm/homicidal ideation, psychosis, and paranoia.  Patient answered question appropriately.       For detailed note see TTS tele assessment note   Recommendations:  Resources for outpatient psychiatric services  Disposition:  Patient psychiatrically cleared No evidence of imminent risk to self or others at present.   Patient does not meet criteria for psychiatric inpatient admission.  Spoke with Stanton Kidney, RN; informed of above recommendation and disposition; states she will inform Dr. Florene Glen who is not currently on unit  Bethzy Hauck B. Trayvion Embleton, NP

## 2018-04-20 NOTE — Progress Notes (Addendum)
PROGRESS NOTE    [name]  TDS:[id number] DOB: 07-10-1951 DOA: 04/13/2018 PCP: [name], MD  Brief Narrative: 67 year old female with [name] history of diastolic CHF, atrial fibrillation on anticoagulation, diabetes and chronic kidney disease, who was recently discharged the hospital on 8/27. At that time, she was advised to go to skilled nursing facility, but had initially wanted to go home. Upon returning home, patient had Rolfe Hartsell fall while transferring from her wheelchair. She is suffered bilateral fractures in her lower extremities. Seen by orthopedics and plan is for nonoperative management. Seen by physical therapy who recommended skilled nursing facility placement.   Assessment & Plan:   Active Problems:   Diabetes type 2, uncontrolled (HCC)   HTN (hypertension), benign   Left tibial fracture   Unspecified atrial fibrillation (HCC)   Chronic diastolic CHF (congestive heart failure) (HCC)   CKD (chronic kidney disease), stage III (HCC)   Closed right fibular fracture  Paranoia  Aggressive behavior: Called by nurse who noted that the patient had spread blood on her arm.  My discussion with her, she says that she was upset because blood from the blood sugar check had dripped down her arm and she was upset about that.  On my discussion with her, she is the first thing that I was going to have to gain her trust.  She admitted to paranoid thoughts feeling like someone was trying to hurt her.  Discussed importance of respecting staff and the danger of what she previously did.  Discussed with nursing who said she had no open wounds, recommended discussing with employee health. Psychiatry c/s for paranoia, aggressive behavior  1. Bilateral lower extremity fractures status post fall. Seen by orthopedics and management will be nonoperative. She will be nonweightbearing on both lower extremities. Continue pain management.   Right ankle bimalleolar fracture will be treated with Brinley Treanor  cam walker.  Left proximal tibia fracture will be treated with immobilization within the immobilizer.  Seen by physical therapy recommended skilled nursing facility placement. Patient is agreeable for placement.   2. Chronic diastolic congestive heart failure. Appears to be euvolemic. Continued on Lasix.  3. Chronic atrial fibrillation. Currently on Cardizem and metoprolol. Heart rate is stable. Monitor on telemetry.. Anticoagulated with Eliquis.  4. Left atrial thrombus. Recently diagnosed, currently on anticoagulation with Eliquis.  5. Type 2 diabetes. Blood sugars currently stable.   Resume glipizide on discharge.  Will stop mealtime with low BG yesterday.  6. Chronic kidney disease stage III. Creatinine is currently at baseline. We will continue to monitor.  7. Visualhallucinations. Patient was seeing "bugs on the wall".Urinalysis without signs of infection. Robaxin discontinued. No c/o of this to me today.  DVT prophylaxis: eliquis Code Status: full Family Communication: none at bedside Disposition Plan:pending SNF placement   Consultants:   Psychiatry, orthopedics  Procedures: =  none  Antimicrobials: ( Anti-infectives (From admission, onward)   None           Subjective: Denies pain.   Describes paranoia, like someones out to get her.  Objective: Vitals:   04/19/18 2003 04/20/18 0541 04/20/18 0755 04/20/18 1427  BP:  129/90  130/74  Pulse:  100  86  Resp:  18  18  Temp:  (!) 97.4 F (36.3 C)  98.5 F (36.9 C)  TempSrc:  Oral    SpO2: 95% 98% 98%   Weight:      Height:        Intake/Output Summary (Last 24 hours) at 04/20/2018 1836  Last data filed at 04/20/2018 0915 Gross per 24 hour  Intake 480 ml  Output 200 ml  Net 280 ml   Filed Weights   04/15/18 1502  Weight: 120.5 kg    Examination:  General exam: Appears calm and comfortable, eating [name] meal Respiratory system: unlabored breathing Central nervous system: Alert and  oriented. No focal neurological deficits. Extremities: immobilizer on LLE and cast on RLE Skin: No rashes, lesions or ulcers Psychiatry: Judgement and insight appear normal. Paranoid.     Data Reviewed: I have personally reviewed following labs and imaging studies  CBC: Recent Labs  Lab 04/14/18 0120 04/14/18 0514 04/16/18 0652 04/19/18 0629  WBC 11.1* 12.3* 12.2* 10.3  NEUTROABS 8.7*  --   --   --   HGB 9.1* 9.3* 9.3* 9.7*  HCT 31.1* 31.7* 31.6* 33.7*  MCV 89.9 90.8 90.8 91.1  PLT 338 305 333 973   Basic Metabolic Panel: Recent Labs  Lab 04/14/18 0120 04/14/18 0514 04/16/18 0652 04/17/18 0716 04/19/18 0629  NA 141 140 139 138 137  K 4.2 4.2 4.1 3.7 4.2  CL 101 100 98 96* 98  CO2 27 24 26 26 26   GLUCOSE 221* 222* 112* 196* 180*  BUN 42* 41* 47* 46* 42*  CREATININE 1.87* 1.86* 2.18* 1.95* 1.67*  CALCIUM 8.0* 7.9* 7.6* 7.6* 8.0*   GFR: Estimated Creatinine Clearance: 41.8 mL/min (Kelise Kuch) (by C-G formula based on SCr of 1.67 mg/dL (H)). Liver Function Tests: Recent Labs  Lab 04/14/18 0120 04/14/18 0514  AST 57* 59*  ALT 46* 50*  ALKPHOS 115 112  BILITOT 1.4* 1.4*  PROT 6.7 6.6  ALBUMIN 3.1* 3.1*   No results for input(s): LIPASE, AMYLASE in the last 168 hours. No results for input(s): AMMONIA in the last 168 hours. Coagulation Profile: No results for input(s): INR, PROTIME in the last 168 hours. Cardiac Enzymes: No results for input(s): CKTOTAL, CKMB, CKMBINDEX, TROPONINI in the last 168 hours. BNP (last 3 results) No results for input(s): PROBNP in the last 8760 hours. HbA1C: No results for input(s): HGBA1C in the last 72 hours. CBG: Recent Labs  Lab 04/19/18 2303 04/20/18 0604 04/20/18 0729 04/20/18 1114 04/20/18 1641  GLUCAP 123* 174* 171* 182* 236*   Lipid Profile: No results for input(s): CHOL, HDL, LDLCALC, TRIG, CHOLHDL, LDLDIRECT in the last 72 hours. Thyroid Function Tests: No results for input(s): TSH, T4TOTAL, FREET4, T3FREE, THYROIDAB  in the last 72 hours. Anemia Panel: No results for input(s): VITAMINB12, FOLATE, FERRITIN, TIBC, IRON, RETICCTPCT in the last 72 hours. Sepsis Labs: No results for input(s): PROCALCITON, LATICACIDVEN in the last 168 hours.  No results found for this or any previous visit (from the past 240 hour(s)).       Radiology Studies: No results found.      Scheduled Meds: . apixaban  5 mg Oral BID  . buPROPion  300 mg Oral Daily  . diltiazem  30 mg Oral Q8H  . fluticasone  2 spray Each Nare Daily  . furosemide  40 mg Oral Daily  . insulin aspart  0-9 Units Subcutaneous TID WC  . insulin aspart  3 Units Subcutaneous TID WC  . loratadine  10 mg Oral Daily  . metoprolol tartrate  150 mg Oral BID  . ondansetron (ZOFRAN) IV  4 mg Intravenous Q6H  . sodium chloride flush  3 mL Intravenous Q12H  . tiotropium  18 mcg Inhalation Daily   Continuous Infusions: . sodium chloride  LOS: 6 days    Time spent: over 30 min MDM high with paranoia, aggression towards staff, psych c/s   Fayrene Helper, MD [facility] Pager [phone]  If 7PM-7AM, please contact night-coverage [email or web address] Password [facility] 04/20/2018, 6:36 PM

## 2018-04-20 NOTE — Progress Notes (Signed)
RN entered room to assess patient.  Patient refused insulin stating, "I don't want that insulin.  I don't need it."  Patient agreed to let nurse assess her.  RN listening to patient's chest with stethoscope.  Patient scratched the right side of her face/ear and wiped blood onto the nurse's right forearm.  Nurse stopped and took alcohol swab and wiped off nurse's arm.  Patient stated, "You didn't like that did you."  Patient stated, "What if I scratched you really hard?  You wouldn't like that would you?  I could take my fingernails and scratch you really hard to get the blood."  Dr. Florene Glen notified and Charge RN notified.

## 2018-04-20 NOTE — Progress Notes (Signed)
Blood sugar was 69. Patient ate peanut butter crackers and drank grape juice. Blood sugar up to 123.

## 2018-04-20 NOTE — Progress Notes (Signed)
Patient yelling and stating that she was going to leave. Reminded patient that she was in the hospital. Patient said that she knew where she was but she was ready to leave. Patient also pulled telemetry off and refused to have it replace. Explained that the telemetry was on to monitor her heart and patient stated that she did not care that she did not want it on anymore. Will continue to monitor patient and attempt to replace telemetry.

## 2018-04-21 DIAGNOSIS — I482 Chronic atrial fibrillation: Secondary | ICD-10-CM

## 2018-04-21 LAB — GLUCOSE, CAPILLARY
GLUCOSE-CAPILLARY: 157 mg/dL — AB (ref 70–99)
Glucose-Capillary: 167 mg/dL — ABNORMAL HIGH (ref 70–99)
Glucose-Capillary: 164 mg/dL — ABNORMAL HIGH (ref 70–99)
Glucose-Capillary: 135 mg/dL — ABNORMAL HIGH (ref 70–99)

## 2018-04-21 LAB — BASIC METABOLIC PANEL
Anion gap: 10 (ref 5–15)
BUN: 28 mg/dL — AB (ref 8–23)
CO2: 32 mmol/L (ref 22–32)
CREATININE: 1.13 mg/dL — AB (ref 0.44–1.00)
Calcium: 8.5 mg/dL — ABNORMAL LOW (ref 8.9–10.3)
Chloride: 97 mmol/L — ABNORMAL LOW (ref 98–111)
GFR calc Af Amer: 57 mL/min — ABNORMAL LOW (ref >60)
GFR calc non Af Amer: 49 mL/min — ABNORMAL LOW (ref >60)
GLUCOSE: 168 mg/dL — AB (ref 70–99)
Potassium: 3.5 mmol/L (ref 3.5–5.1)
SODIUM: 139 mmol/L (ref 135–145)

## 2018-04-21 LAB — CBC
HCT: 34 % — ABNORMAL LOW (ref 36.0–46.0)
Hemoglobin: 9.9 g/dL — ABNORMAL LOW (ref 12.0–15.0)
MCH: 26.2 pg (ref 26.0–34.0)
MCHC: 29.1 g/dL — ABNORMAL LOW (ref 30.0–36.0)
MCV: 89.9 fL (ref 78.0–100.0)
Platelets: 363 K/uL (ref 150–400)
RBC: 3.78 MIL/uL — ABNORMAL LOW (ref 3.87–5.11)
RDW: 16.7 % — ABNORMAL HIGH (ref 11.5–15.5)
WBC: 9.9 K/uL (ref 4.0–10.5)

## 2018-04-21 MED ORDER — OXYCODONE-ACETAMINOPHEN 5-325 MG PO TABS
1.0000 | ORAL_TABLET | ORAL | Status: DC | PRN
  Administered 2018-04-21 – 2018-04-27 (×21): 1 via ORAL
  Filled 2018-04-21 (×21): qty 1

## 2018-04-21 MED ORDER — INFLUENZA VAC SPLIT HIGH-DOSE 0.5 ML IM SUSY
0.5000 mL | PREFILLED_SYRINGE | INTRAMUSCULAR | Status: AC
  Administered 2018-04-23: 0.5 mL via INTRAMUSCULAR
  Filled 2018-04-21: qty 0.5

## 2018-04-21 NOTE — Progress Notes (Signed)
PROGRESS NOTE    [name]  AOZ:[id number] DOB: [birth year] DOA: 04/13/2018 PCP: [name], MD     Brief Narrative:  67 year old female with a history of diastolic CHF, atrial fibrillation on anticoagulation, diabetes and chronic kidney disease, who was recently discharged the hospital on 8/27. At that time, she was advised to go to skilled nursing facility, but had initially wanted to go home. Upon returning home, patient had a fall while transferring from her wheelchair. She is suffered bilateral fractures in her lower extremities. Seen by orthopedics and plan is for nonoperative management. Seen by physical therapy who recommended skilled nursing facility placement.  Assessment & Plan: 1-bilateral lower extremity fractures status post mechanical fall: -Patient seen by orthopedic service and decision for management has been nonoperative. -Patient will be nonweightbearing on both lower extremities -Continue as needed pain medication -Right ankle bimalleolar fracture will be treated with a cam walker.  Left proximal tibia fracture will be treated with immobilization with immobilizer.  Physical therapy has recommended skilled nursing facility placement for further care and rehabilitation. -Patient and family in agreement.  2-diabetes type 2, uncontrolled (HCC) -Overall stable. -Will continue sliding scale insulin while inpatient -Plan is to resume glipizide at discharge.  3-HTN (hypertension), benign -Overall well controlled -Continue current antihypertensive regimen. -Heart healthy diet has been discussed with patient.  4-chronic atrial fibrillation (HCC) -Continue Cardizem and metoprolol for rate control -Continue Eliquis for secondary prevention.  5-patient with history of left atrial thrombus -Continue anticoagulation with Eliquis.  6-chronic kidney disease is stage III -Creatinine at baseline -Continue to follow renal function trend  intermittently  7-Chronic diastolic CHF (congestive heart failure) (HCC) -Stable and compensated. -Continue home dose of Lasix -Heart healthy diet has been discussed with patient. -Recommend daily weights.  8-history of visual hallucinations and delirium: -Robaxin has been discontinue -Urinalysis without any signs of acute infection. -Continue to use the least amount needed for pain medication and provide Constant reorientation.  9-depression -No suicidal ideation  -Cleared by psychiatry service. -Continue Wellbutrin.    DVT prophylaxis: Eliquis. Code Status: Full code Family Communication: Husband at bedside. Disposition Plan: Pending a skilled nursing facility placement.  Consultants:   Orthopedic service  Psychiatry  Procedures:   See below for x-ray reports.  Antimicrobials:  Anti-infectives (From admission, onward)   None      Subjective: No fever, no chest pain, no nausea, no vomiting.  Objective: Vitals:   04/20/18 2031 04/21/18 0549 04/21/18 0814 04/21/18 1335  BP:  120/66  129/68  Pulse:  88  (!) 120  Resp:  18    Temp:  (!) 97.4 F (36.3 C)  (!) 97.5 F (36.4 C)  TempSrc:  Oral  Oral  SpO2: 99% 100% 98% 96%  Weight:      Height:        Intake/Output Summary (Last 24 hours) at 04/21/2018 1903 Last data filed at 04/21/2018 1800 Gross per 24 hour  Intake 720 ml  Output 2000 ml  Net -1280 ml   Filed Weights   04/15/18 1502  Weight: 120.5 kg    Examination: General exam: Alert, awake and in no acute distress.  Patient was appropriate and having good interaction.  Reports no significant pain in her legs. Respiratory system: Clear to auscultation. Respiratory effort normal.  No requiring oxygen supplementation. Cardiovascular system:RRR. No murmurs, rubs, gallops. Gastrointestinal system: Abdomen is nondistended, soft and nontender. No organomegaly or masses felt. Normal bowel sounds heard. Central nervous system: Alert and oriented. No focal  neurological deficits. Extremities: Patient with cast on right lower extremity and immobilizer on her left lower extremity.  Decreased range of motion due to cast and immobilizer.  Patient is not weightbearing currently. Skin: No rashes, lesions or ulcers Psychiatry: Mood & affect appropriate.     Data Reviewed: I have personally reviewed following labs and imaging studies  CBC: Recent Labs  Lab 04/16/18 0652 04/19/18 0629 04/21/18 0556  WBC 12.2* 10.3 9.9  HGB 9.3* 9.7* 9.9*  HCT 31.6* 33.7* 34.0*  MCV 90.8 91.1 89.9  PLT 333 350 862   Basic Metabolic Panel: Recent Labs  Lab 04/16/18 0652 04/17/18 0716 04/19/18 0629 04/21/18 0556  NA 139 138 137 139  K 4.1 3.7 4.2 3.5  CL 98 96* 98 97*  CO2 26 26 26  32  GLUCOSE 112* 196* 180* 168*  BUN 47* 46* 42* 28*  CREATININE 2.18* 1.95* 1.67* 1.13*  CALCIUM 7.6* 7.6* 8.0* 8.5*   GFR: Estimated Creatinine Clearance: 61.8 mL/min (A) (by C-G formula based on SCr of 1.13 mg/dL (H)).  CBG: Recent Labs  Lab 04/20/18 1641 04/20/18 2023 04/21/18 0732 04/21/18 1144 04/21/18 1602  GLUCAP 236* 238* 167* 164* 135*   Urine analysis:    Component Value Date/Time   COLORURINE YELLOW 04/18/2018 1522   APPEARANCEUR HAZY (A) 04/18/2018 1522   LABSPEC 1.017 04/18/2018 1522   PHURINE 5.0 04/18/2018 1522   GLUCOSEU NEGATIVE 04/18/2018 1522   HGBUR LARGE (A) 04/18/2018 1522   BILIRUBINUR NEGATIVE 04/18/2018 1522   KETONESUR NEGATIVE 04/18/2018 1522   PROTEINUR 100 (A) 04/18/2018 1522   UROBILINOGEN 0.2 12/01/2007 1025   NITRITE NEGATIVE 04/18/2018 1522   LEUKOCYTESUR MODERATE (A) 04/18/2018 1522   Scheduled Meds: . apixaban  5 mg Oral BID  . buPROPion  300 mg Oral Daily  . diltiazem  30 mg Oral Q8H  . fluticasone  2 spray Each Nare Daily  . furosemide  40 mg Oral Daily  . [START ON 04/22/2018] Influenza vac split quadrivalent PF  0.5 mL Intramuscular Tomorrow-1000  . insulin aspart  0-9 Units Subcutaneous TID WC  . metoprolol  tartrate  150 mg Oral BID  . ondansetron (ZOFRAN) IV  4 mg Intravenous Q6H  . sodium chloride flush  3 mL Intravenous Q12H  . tiotropium  18 mcg Inhalation Daily   Continuous Infusions: . sodium chloride       LOS: 7 days    Time spent: 30 minutes    Barton Dubois, MD [facility] Pager [phone]  If 7PM-7AM, please contact night-coverage [email or web address] Password [email or web address] 04/21/2018, 7:03 PM

## 2018-04-21 NOTE — Clinical Social Work Note (Signed)
LCSW updated patient that facility had submitted insurance authorization and were waiting for authorization.     Julianna Vanwagner, Clydene Pugh, LCSW

## 2018-04-21 NOTE — Evaluation (Addendum)
Physical Therapy Visit Patient Details Name: ATHENIA RYS MRN: [id number] DOB: 1951/03/27 Today's Date: 04/21/2018   History of Present Illness  Tiffnay Bossi  is a 67 y.o. female, with history of chronic diastolic CHF, atrial fibrillation on anticoagulation with Eliquis, left atrial thrombus, type 2 diabetes mellitus, chronic kidney disease stage III hypertension, anemia who was just discharged from the hospital on 04/13/2018.  Patient at home was trying to transfer herself from wheelchair to left ear and she missed the left.  And injured left lower leg and right ankle.  Patient was brought to hospital.  She denies passing out.  No dizziness or blurred vision.  No chest pain or shortness of breath.  No nausea vomiting or diarrhea.  No abdominal pain.  No dysuria, urgency or frequency of urination.  Clinical Impression  Pt was seen for progressing slding transfers and note pt is getting uncomfortable with the effort.  Her strategy has been to lie down and scoot holding onto stable objects like bedrail but is mainly struggling with difficulty in hiking R hip.  Talked to her about the technique and she is not carrying over to practice.  Follow acutely for strengthening and work on these skills.    Follow Up Recommendations SNF    Equipment Recommendations  None recommended by PT    Recommendations for Other Services       Precautions / Restrictions Precautions Precautions: Fall Precaution Comments: watch HR  Required Braces or Orthoses: Knee Immobilizer - Left;Other Brace/Splint Knee Immobilizer - Left: On at all times Other Brace/Splint: cam boot Right ankle Restrictions Weight Bearing Restrictions: Yes RLE Weight Bearing: Non weight bearing LLE Weight Bearing: Non weight bearing Other Position/Activity Restrictions: NWB BLE's      Mobility  Bed Mobility Overal bed mobility: Needs Assistance Bed Mobility: Supine to Sit;Sit to Supine Rolling: Supervision Sidelying to sit: Min  assist Supine to sit: Mod assist Sit to supine: Mod assist   General bed mobility comments: continues to need help mainly with cam boot  Transfers Overall transfer level: Needs assistance   Transfers: Lateral/Scoot Transfers          Lateral/Scoot Transfers: Min assist    Ambulation/Gait             General Gait Details: NWB BLE's  Stairs            Wheelchair Mobility    Modified Rankin (Stroke Patients Only)       Balance                                             Pertinent Vitals/Pain Pain Assessment: No/denies pain    Home Living                        Prior Function                 Hand Dominance        Extremity/Trunk Assessment                Communication      Cognition Arousal/Alertness: Awake/alert Behavior During Therapy: WFL for tasks assessed/performed Overall Cognitive Status: Within Functional Limits for tasks assessed  General Comments      Exercises     Assessment/Plan    PT Assessment    PT Problem List         PT Treatment Interventions      PT Goals (Current goals can be found in the Care Plan section)  Acute Rehab PT Goals Patient Stated Goal: go to rehab soon    Frequency Min 3X/week   Barriers to discharge        Co-evaluation               AM-PAC PT "6 Clicks" Daily Activity  Outcome Measure Difficulty turning over in bed (including adjusting bedclothes, sheets and blankets)?: A Little Difficulty moving from lying on back to sitting on the side of the bed? : A Lot Difficulty sitting down on and standing up from a chair with arms (e.g., wheelchair, bedside commode, etc,.)?: Unable Help needed moving to and from a bed to chair (including a wheelchair)?: Total Help needed walking in hospital room?: Total Help needed climbing 3-5 steps with a railing? : Total 6 Click Score: 9    End of Session    Activity Tolerance: Patient tolerated treatment well;Patient limited by fatigue;Patient limited by pain Patient left: in bed;with call bell/phone within reach;with bed alarm set;with family/visitor present Nurse Communication: Mobility status PT Visit Diagnosis: History of falling (Z91.81);Muscle weakness (generalized) (M62.81);Difficulty in walking, not elsewhere classified (R26.2)    Time: 7482-7078 PT Time Calculation (min) (ACUTE ONLY): 21 min   Charges:     PT Treatments $Therapeutic Activity: 8-22 mins        Ramond Dial 04/21/2018, 11:34 AM   11:36 AM, 04/21/18 Mee Hives, PT, MS Physical Therapist - [place] [phone] (Office)

## 2018-04-22 LAB — GLUCOSE, CAPILLARY
GLUCOSE-CAPILLARY: 220 mg/dL — AB (ref 70–99)
GLUCOSE-CAPILLARY: 221 mg/dL — AB (ref 70–99)
GLUCOSE-CAPILLARY: 197 mg/dL — AB (ref 70–99)
Glucose-Capillary: 183 mg/dL — ABNORMAL HIGH (ref 70–99)
Glucose-Capillary: 191 mg/dL — ABNORMAL HIGH (ref 70–99)

## 2018-04-22 NOTE — Care Management Note (Signed)
Case Management Note  Patient Details  Name: [name] MRN: [id number] Date of Birth: 07/16/1951   If discussed at [place] of Stay Meetings, dates discussed:  04/22/18  Additional Comments:  Sherald Barge, RN 04/22/2018, 2:10 PM

## 2018-04-22 NOTE — Progress Notes (Signed)
PROGRESS NOTE    [name]  TKP:[id number] DOB: 03/28/1951 DOA: 04/13/2018 PCP: [name], MD     Brief Narrative:  67 year old female with a history of diastolic CHF, atrial fibrillation on anticoagulation, diabetes and chronic kidney disease, who was recently discharged the hospital on 8/27. At that time, she was advised to go to skilled nursing facility, but had initially wanted to go home. Upon returning home, patient had a fall while transferring from her wheelchair. She is suffered bilateral fractures in her lower extremities. Seen by orthopedics and plan is for nonoperative management. Seen by physical therapy who recommended skilled nursing facility placement.  Assessment & Plan: 1-bilateral lower extremity fractures status post mechanical fall: -Patient seen by orthopedic service and decision for management has been nonoperative. -Patient will be nonweightbearing on both lower extremities -Continue as needed pain medication -Right ankle bimalleolar fracture will be treated with a cam walker.  Left proximal tibia fracture will be treated with immobilization with immobilizer.  Physical therapy has recommended skilled nursing facility placement for further care and rehabilitation. -Patient and family in agreement.  2-diabetes type 2, uncontrolled (HCC) -Overall stable. -Will continue sliding scale insulin while inpatient -Plan is to resume glipizide at discharge.  3-HTN (hypertension), benign -Overall well controlled -Continue current antihypertensive regimen. -Heart healthy diet has been discussed with patient.  4-chronic atrial fibrillation (HCC) -Continue Cardizem and metoprolol for rate control -Continue Eliquis for secondary prevention.  5-patient with history of left atrial thrombus -Continue anticoagulation with Eliquis.  6-chronic kidney disease is stage III -Creatinine at baseline -Continue to follow renal function trend  intermittently  7-Chronic diastolic CHF (congestive heart failure) (HCC) -Stable and compensated. -Continue home dose of Lasix -Heart healthy diet has been discussed with patient. -Recommend daily weights.  8-history of visual hallucinations and delirium: -Robaxin has been discontinue -Urinalysis without any signs of acute infection. -Continue to use the least amount needed for pain medication and provide Constant reorientation. -no further Hallucinations has been described at this moment  9-depression -No suicidal ideation  -Cleared by psychiatry service. -Continue Wellbutrin.    DVT prophylaxis: Eliquis. Code Status: Full code Family Communication: Husband at bedside. Disposition Plan: Pending a skilled nursing facility placement.  Consultants:   Orthopedic service  Psychiatry  Procedures:   See below for x-ray reports.  Antimicrobials:  Anti-infectives (From admission, onward)   None      Subjective: No fever, no chest pain, no nausea, no vomiting.  Reports having a good night and resting without any hallucinations or overnight events.  Objective: Vitals:   04/22/18 0606 04/22/18 0758 04/22/18 1019 04/22/18 1303  BP: 133/61  (!) 114/59 121/63  Pulse: 61  85 82  Resp: 14  16 18   Temp: 98.2 F (36.8 C)  98.1 F (36.7 C) 97.6 F (36.4 C)  TempSrc: Oral  Oral Oral  SpO2: 97% 96% 98% 98%  Weight:      Height:        Intake/Output Summary (Last 24 hours) at 04/22/2018 1749 Last data filed at 04/22/2018 1200 Gross per 24 hour  Intake 723 ml  Output 1300 ml  Net -577 ml   Filed Weights   04/15/18 1502  Weight: 120.5 kg    Examination: General exam: Alert, awake, and in no acute distress.  Patient appropriate during examination, having good interaction and denying any acute complaints. Respiratory system: Clear to auscultation. Respiratory effort normal. Cardiovascular system:RRR. No murmurs, rubs, gallops. Gastrointestinal system: Abdomen is obese,  nondistended, soft and  nontender. No organomegaly or masses felt. Normal bowel sounds heard. Central nervous system: Alert and oriented. No focal neurological deficits. Extremities: Able to move both legs and there is no signs of cyanosis or clubbing.  Cast on her right lower extremity and immobilizer on her left lower extremity.  Patient is not weightbearing currently Skin: No rashes, lesions or ulcers Psychiatry: Judgement and insight appear normal. Mood & affect appropriate.    Data Reviewed: I have personally reviewed following labs and imaging studies  CBC: Recent Labs  Lab 04/16/18 0652 04/19/18 0629 04/21/18 0556  WBC 12.2* 10.3 9.9  HGB 9.3* 9.7* 9.9*  HCT 31.6* 33.7* 34.0*  MCV 90.8 91.1 89.9  PLT 333 350 885   Basic Metabolic Panel: Recent Labs  Lab 04/16/18 0652 04/17/18 0716 04/19/18 0629 04/21/18 0556  NA 139 138 137 139  K 4.1 3.7 4.2 3.5  CL 98 96* 98 97*  CO2 26 26 26  32  GLUCOSE 112* 196* 180* 168*  BUN 47* 46* 42* 28*  CREATININE 2.18* 1.95* 1.67* 1.13*  CALCIUM 7.6* 7.6* 8.0* 8.5*   GFR: Estimated Creatinine Clearance: 61.8 mL/min (A) (by C-G formula based on SCr of 1.13 mg/dL (H)).  CBG: Recent Labs  Lab 04/21/18 2138 04/22/18 0801 04/22/18 0942 04/22/18 1143 04/22/18 1618  GLUCAP 157* 183* 220* 191* 221*   Urine analysis:    Component Value Date/Time   COLORURINE YELLOW 04/18/2018 1522   APPEARANCEUR HAZY (A) 04/18/2018 1522   LABSPEC 1.017 04/18/2018 1522   PHURINE 5.0 04/18/2018 1522   GLUCOSEU NEGATIVE 04/18/2018 1522   HGBUR LARGE (A) 04/18/2018 1522   BILIRUBINUR NEGATIVE 04/18/2018 1522   KETONESUR NEGATIVE 04/18/2018 1522   PROTEINUR 100 (A) 04/18/2018 1522   UROBILINOGEN 0.2 12/01/2007 1025   NITRITE NEGATIVE 04/18/2018 1522   LEUKOCYTESUR MODERATE (A) 04/18/2018 1522   Scheduled Meds: . apixaban  5 mg Oral BID  . buPROPion  300 mg Oral Daily  . diltiazem  30 mg Oral Q8H  . fluticasone  2 spray Each Nare Daily  .  furosemide  40 mg Oral Daily  . Influenza vac split quadrivalent PF  0.5 mL Intramuscular Tomorrow-1000  . insulin aspart  0-9 Units Subcutaneous TID WC  . metoprolol tartrate  150 mg Oral BID  . ondansetron (ZOFRAN) IV  4 mg Intravenous Q6H  . sodium chloride flush  3 mL Intravenous Q12H  . tiotropium  18 mcg Inhalation Daily   Continuous Infusions: . sodium chloride       LOS: 8 days    Time spent: 25 minutes    Barton Dubois, MD [facility] Pager [phone]  If 7PM-7AM, please contact night-coverage [email or web address] Password [facility] 04/22/2018, 5:49 PM

## 2018-04-22 NOTE — Progress Notes (Signed)
Physical Therapy Treatment Patient Details Name: [name] MRN: [id number] DOB: Mar 26, 1951 Today's Date: 04/22/2018    History of Present Illness [name]  is a 67 y.o. female, with history of chronic diastolic CHF, atrial fibrillation on anticoagulation with Eliquis, left atrial thrombus, type 2 diabetes mellitus, chronic kidney disease stage III hypertension, anemia who was just discharged from the hospital on 04/13/2018.  Patient at home was trying to transfer herself from wheelchair to left ear and she missed the left.  And injured left lower leg and right ankle.  Patient was brought to hospital.  She denies passing out.  No dizziness or blurred vision.  No chest pain or shortness of breath.  No nausea vomiting or diarrhea.  No abdominal pain.  No dysuria, urgency or frequency of urination.    PT Comments    Pt was assessed today and continues to improve pain, increase active use of her LLE especially.  Will focus on the mobility of her legs with slding transfers and progress to chair as tolerated, although pt is reluctant to move from the bed.  Continue acutely and progress as tolerated.   Follow Up Recommendations  SNF     Equipment Recommendations  None recommended by PT    Recommendations for Other Services       Precautions / Restrictions Precautions Precautions: Fall Precaution Comments: ck HR with mobility Required Braces or Orthoses: Knee Immobilizer - Left;Other Brace/Splint Knee Immobilizer - Left: On at all times Other Brace/Splint: cam boot Right ankle Restrictions Weight Bearing Restrictions: Yes RLE Weight Bearing: Non weight bearing LLE Weight Bearing: Non weight bearing Other Position/Activity Restrictions: NWB BLE's    Mobility  Bed Mobility Overal bed mobility: Needs Assistance Bed Mobility: (scooting)           General bed mobility comments: max assist to scoot up in bed for comfort  Transfers                    Ambulation/Gait                 Stairs             Wheelchair Mobility    Modified Rankin (Stroke Patients Only)       Balance                                            Cognition Arousal/Alertness: Awake/alert Behavior During Therapy: WFL for tasks assessed/performed Overall Cognitive Status: Within Functional Limits for tasks assessed                                        Exercises General Exercises - Lower Extremity Ankle Circles/Pumps: AROM;5 reps;Left Quad Sets: AROM;Both;10 reps Gluteal Sets: AROM;Both;10 reps Heel Slides: AROM;AAROM;Both;20 reps Hip ABduction/ADduction: AROM;AAROM;Both;20 reps Straight Leg Raises: AAROM;AROM;Both;10 reps    General Comments        Pertinent Vitals/Pain Pain Assessment: No/denies pain    Home Living                      Prior Function            PT Goals (current goals can now be found in the care plan section) Acute Rehab PT Goals Patient Stated Goal: go to  rehab soon Progress towards PT goals: Progressing toward goals    Frequency    Min 3X/week      PT Plan Current plan remains appropriate    Co-evaluation              AM-PAC PT "6 Clicks" Daily Activity  Outcome Measure  Difficulty turning over in bed (including adjusting bedclothes, sheets and blankets)?: A Little Difficulty moving from lying on back to sitting on the side of the bed? : A Lot Difficulty sitting down on and standing up from a chair with arms (e.g., wheelchair, bedside commode, etc,.)?: Unable Help needed moving to and from a bed to chair (including a wheelchair)?: Total Help needed walking in hospital room?: Total Help needed climbing 3-5 steps with a railing? : Total 6 Click Score: 9    End of Session   Activity Tolerance: Patient tolerated treatment well;Patient limited by fatigue Patient left: in bed;with call bell/phone within reach;with bed alarm set Nurse Communication: Mobility  status PT Visit Diagnosis: History of falling (Z91.81);Muscle weakness (generalized) (M62.81);Difficulty in walking, not elsewhere classified (R26.2)     Time: 7262-0355 PT Time Calculation (min) (ACUTE ONLY): 17 min  Charges:  $Therapeutic Exercise: 8-22 mins                     Ramond Dial 04/22/2018, 3:36 PM   3:40 PM, 04/22/18 Mee Hives, PT, MS Physical Therapist - [place] [phone] (Office)

## 2018-04-23 ENCOUNTER — Ambulatory Visit: Payer: BLUE CROSS/BLUE SHIELD | Admitting: Cardiology

## 2018-04-23 LAB — GLUCOSE, CAPILLARY
GLUCOSE-CAPILLARY: 195 mg/dL — AB (ref 70–99)
GLUCOSE-CAPILLARY: 166 mg/dL — AB (ref 70–99)
Glucose-Capillary: 185 mg/dL — ABNORMAL HIGH (ref 70–99)
Glucose-Capillary: 219 mg/dL — ABNORMAL HIGH (ref 70–99)
Glucose-Capillary: 191 mg/dL — ABNORMAL HIGH (ref 70–99)

## 2018-04-23 NOTE — Progress Notes (Signed)
PROGRESS NOTE    [name]  QIO:[id number] DOB: 08/03/1951 DOA: 04/13/2018 PCP: [name], MD     Brief Narrative:  67 year old female with a history of diastolic CHF, atrial fibrillation on anticoagulation, diabetes and chronic kidney disease, who was recently discharged the hospital on 8/27. At that time, she was advised to go to skilled nursing facility, but had initially wanted to go home. Upon returning home, patient had a fall while transferring from her wheelchair. She is suffered bilateral fractures in her lower extremities. Seen by orthopedics and plan is for nonoperative management. Seen by physical therapy who recommended skilled nursing facility placement.  Assessment & Plan: 1-bilateral lower extremity fractures status post mechanical fall: -Patient seen by orthopedic service and decision for management has been nonoperative. -Patient will be nonweightbearing on both lower extremities -Continue as needed pain medication -Right ankle bimalleolar fracture will be treated with a cam walker.  Left proximal tibia fracture will be treated with immobilization with immobilizer.  Physical therapy has recommended skilled nursing facility placement for further care and rehabilitation. -Patient and family in agreement.  2-diabetes type 2, uncontrolled (HCC) -Overall stable. -Will continue sliding scale insulin while inpatient -Plan is to resume glipizide at discharge.  3-HTN (hypertension), benign -Overall well controlled -Continue current antihypertensive regimen. -Heart healthy diet has been discussed with patient.  4-chronic atrial fibrillation (HCC) -Continue Cardizem and metoprolol for rate control -Continue Eliquis for secondary prevention.  5-patient with history of left atrial thrombus -Continue anticoagulation with Eliquis.  6-chronic kidney disease is stage III -Creatinine at baseline -Continue to follow renal function trend  intermittently  7-Chronic diastolic CHF (congestive heart failure) (HCC) -Stable and compensated. -Continue home dose of Lasix -Heart healthy diet has been discussed with patient. -Recommend daily weights.  8-history of visual hallucinations and delirium: -Robaxin has been discontinue -Urinalysis without any signs of acute infection. -Continue to use the least amount needed for pain medication and provide Constant reorientation. -no further Hallucinations has been described at this moment  9-depression -No suicidal ideation  -Cleared by psychiatry service. -Continue Wellbutrin.    DVT prophylaxis: Eliquis. Code Status: Full code Family Communication: Husband at bedside. Disposition Plan: Pending a skilled nursing facility placement.  Consultants:   Orthopedic service  Psychiatry  Procedures:   See below for x-ray reports.  Antimicrobials:  Anti-infectives (From admission, onward)   None      Subjective: No fever, no chest pain, no nausea, no vomiting.  Following commands appropriately and in no acute distress.  Objective: Vitals:   04/23/18 0629 04/23/18 0656 04/23/18 0918 04/23/18 1400  BP: 128/77 113/90 (!) 125/44 129/82  Pulse: (!) 119 (!) 118 65 79  Resp: 18 18  20   Temp: 97.8 F (36.6 C) 98.5 F (36.9 C)  98.4 F (36.9 C)  TempSrc: Oral Oral    SpO2: 96% 91%  96%  Weight:      Height:        Intake/Output Summary (Last 24 hours) at 04/23/2018 1627 Last data filed at 04/23/2018 0900 Gross per 24 hour  Intake 480 ml  Output 700 ml  Net -220 ml   Filed Weights   04/15/18 1502  Weight: 120.5 kg    Examination: Patient seen and examined.  Physical examination essentially unchanged from my assessment on 04/22/2018.  Patient remains to be stable in no acute distress, denying chest pain, palpitations, nausea, vomiting, abdominal pain or any other acute complaints.  Just waiting for authorization before she is discharged to skilled  nursing facility.   See below for further info/details on examination. Respiratory system: Clear to auscultation. Respiratory effort normal. Cardiovascular system:RRR. No murmurs, rubs, gallops. Gastrointestinal system: Abdomen is obese, nondistended, soft and nontender. No organomegaly or masses felt. Normal bowel sounds heard. Central nervous system: Alert and oriented. No focal neurological deficits. Extremities: Able to move both legs and there is no signs of cyanosis or clubbing.  Cast on her right lower extremity and immobilizer on her left lower extremity.  Patient is not weightbearing currently Skin: No rashes, lesions or ulcers Psychiatry: Judgement and insight appear normal. Mood & affect appropriate.    Data Reviewed: I have personally reviewed following labs and imaging studies  CBC: Recent Labs  Lab 04/19/18 0629 04/21/18 0556  WBC 10.3 9.9  HGB 9.7* 9.9*  HCT 33.7* 34.0*  MCV 91.1 89.9  PLT 350 953   Basic Metabolic Panel: Recent Labs  Lab 04/17/18 0716 04/19/18 0629 04/21/18 0556  NA 138 137 139  K 3.7 4.2 3.5  CL 96* 98 97*  CO2 26 26 32  GLUCOSE 196* 180* 168*  BUN 46* 42* 28*  CREATININE 1.95* 1.67* 1.13*  CALCIUM 7.6* 8.0* 8.5*   GFR: Estimated Creatinine Clearance: 61.8 mL/min (A) (by C-G formula based on SCr of 1.13 mg/dL (H)).  CBG: Recent Labs  Lab 04/22/18 1618 04/22/18 2154 04/23/18 0228 04/23/18 0743 04/23/18 1120  GLUCAP 221* 197* 185* 219* 195*   Urine analysis:    Component Value Date/Time   COLORURINE YELLOW 04/18/2018 1522   APPEARANCEUR HAZY (A) 04/18/2018 1522   LABSPEC 1.017 04/18/2018 1522   PHURINE 5.0 04/18/2018 1522   GLUCOSEU NEGATIVE 04/18/2018 1522   HGBUR LARGE (A) 04/18/2018 1522   BILIRUBINUR NEGATIVE 04/18/2018 1522   KETONESUR NEGATIVE 04/18/2018 1522   PROTEINUR 100 (A) 04/18/2018 1522   UROBILINOGEN 0.2 12/01/2007 1025   NITRITE NEGATIVE 04/18/2018 1522   LEUKOCYTESUR MODERATE (A) 04/18/2018 1522   Scheduled Meds: .  apixaban  5 mg Oral BID  . buPROPion  300 mg Oral Daily  . diltiazem  30 mg Oral Q8H  . fluticasone  2 spray Each Nare Daily  . furosemide  40 mg Oral Daily  . insulin aspart  0-9 Units Subcutaneous TID WC  . metoprolol tartrate  150 mg Oral BID  . ondansetron (ZOFRAN) IV  4 mg Intravenous Q6H  . sodium chloride flush  3 mL Intravenous Q12H  . tiotropium  18 mcg Inhalation Daily   Continuous Infusions: . sodium chloride       LOS: 9 days    Time spent: 20 minutes    Barton Dubois, MD [facility] Pager [phone]  If 7PM-7AM, please contact night-coverage [email or web address] Password [facility] 04/23/2018, 4:27 PM

## 2018-04-23 NOTE — Clinical Social Work Note (Signed)
Additional paperwork sent to facility for completion. LCSW assisted facility in completion and faxed to [place] in an effort to obtain authorization.     Antwuan Eckley, Clydene Pugh, LCSW

## 2018-04-24 LAB — GLUCOSE, CAPILLARY
GLUCOSE-CAPILLARY: 228 mg/dL — AB (ref 70–99)
Glucose-Capillary: 241 mg/dL — ABNORMAL HIGH (ref 70–99)
Glucose-Capillary: 196 mg/dL — ABNORMAL HIGH (ref 70–99)
Glucose-Capillary: 140 mg/dL — ABNORMAL HIGH (ref 70–99)

## 2018-04-24 NOTE — Progress Notes (Signed)
PROGRESS NOTE    [name]  NWG:[id number] DOB: 03/09/1951 DOA: 04/13/2018 PCP: [name], MD     Brief Narrative:  67 year old female with a history of diastolic CHF, atrial fibrillation on anticoagulation, diabetes and chronic kidney disease, who was recently discharged the hospital on 8/27. At that time, she was advised to go to skilled nursing facility, but had initially wanted to go home. Upon returning home, patient had a fall while transferring from her wheelchair. She is suffered bilateral fractures in her lower extremities. Seen by orthopedics and plan is for nonoperative management. Seen by physical therapy who recommended skilled nursing facility placement.  Assessment & Plan: 1-bilateral lower extremity fractures status post mechanical fall: -Patient seen by orthopedic service and decision for management has been nonoperative. -Patient will be nonweightbearing on both lower extremities -Continue as needed pain medication -Right ankle bimalleolar fracture will be treated with a cam walker.  Left proximal tibia fracture will be treated with immobilization with immobilizer.  Physical therapy has recommended skilled nursing facility placement for further care and rehabilitation. -Patient and family in agreement.  2-diabetes type 2, uncontrolled (HCC) -Overall stable. -Will continue sliding scale insulin while inpatient -Plan is to resume glipizide at discharge.  3-HTN (hypertension), benign -Overall well controlled -Continue current antihypertensive regimen. -Heart healthy diet has been discussed with patient.  4-chronic atrial fibrillation (HCC) -Continue Cardizem and metoprolol for rate control -Continue Eliquis for secondary prevention.  5-patient with history of left atrial thrombus -Continue anticoagulation with Eliquis.  6-chronic kidney disease is stage III -Creatinine at baseline -Continue to follow renal function trend  intermittently  7-Chronic diastolic CHF (congestive heart failure) (HCC) -Stable and compensated. -Continue home dose of Lasix -Heart healthy diet has been discussed with patient. -Recommend daily weights.  8-history of visual hallucinations and delirium: -Robaxin has been discontinue -Urinalysis without any signs of acute infection. -Continue to use the least amount needed for pain medication and provide Constant reorientation. -no further Hallucinations has been described at this moment  9-depression -No suicidal ideation  -Cleared by psychiatry service. -Continue Wellbutrin.    DVT prophylaxis: Eliquis. Code Status: Full code Family Communication: Husband at bedside. Disposition Plan: Pending insurance authorization in order to discharge her to skilled nursing facility. Patient is medically stable.  Consultants:   Orthopedic service  Psychiatry  Procedures:   See below for x-ray reports.  Antimicrobials:  Anti-infectives (From admission, onward)   None      Subjective: No events.  Denies chest pain, no nausea, no vomiting, no palpitations.  Patient is following commands appropriately and is oriented x3.  Objective: Vitals:   04/23/18 2134 04/24/18 0630 04/24/18 0817 04/24/18 1344  BP: 106/72 140/84  131/60  Pulse: 73 79  79  Resp: 20 20  20   Temp: 98.6 F (37 C) 98.3 F (36.8 C)  97.7 F (36.5 C)  TempSrc: Oral Oral  Oral  SpO2: 95% 98% 94% 100%  Weight:      Height:        Intake/Output Summary (Last 24 hours) at 04/24/2018 1657 Last data filed at 04/24/2018 1300 Gross per 24 hour  Intake 720 ml  Output 1250 ml  Net -530 ml   Filed Weights   04/15/18 1502  Weight: 120.5 kg    Examination: General exam: Alert, awake, oriented x 3.  Patient is afebrile, denies chest pain, no shortness of breath, no palpitations.  Reports feeling frustrated about her situation and having to wait for authorization before moving into rehabilitation.  No further  hallucination has been described and she reported having a good overnight sleep. Respiratory system: Clear to auscultation. Respiratory effort normal. Cardiovascular system:RRR. No murmurs, rubs, gallops. Gastrointestinal system: Abdomen is obese, nondistended, soft and nontender. No organomegaly or masses felt. Normal bowel sounds heard. Central nervous system: Alert and oriented. No focal neurological deficits. Extremities: No cyanosis, no clubbing.  Cast on her right lower extremity and immobilizer device on her left lower extremity appreciated on exam.  Patient remains nonweightbearing. Skin: No rashes, lesions or ulcers Psychiatry: Judgement and insight appear normal. Mood & affect appropriate.   Data Reviewed: I have personally reviewed following labs and imaging studies  CBC: Recent Labs  Lab 04/19/18 0629 04/21/18 0556  WBC 10.3 9.9  HGB 9.7* 9.9*  HCT 33.7* 34.0*  MCV 91.1 89.9  PLT 350 256   Basic Metabolic Panel: Recent Labs  Lab 04/19/18 0629 04/21/18 0556  NA 137 139  K 4.2 3.5  CL 98 97*  CO2 26 32  GLUCOSE 180* 168*  BUN 42* 28*  CREATININE 1.67* 1.13*  CALCIUM 8.0* 8.5*   GFR: Estimated Creatinine Clearance: 61.8 mL/min (A) (by C-G formula based on SCr of 1.13 mg/dL (H)).  CBG: Recent Labs  Lab 04/23/18 1659 04/23/18 2132 04/24/18 0756 04/24/18 1146 04/24/18 1633  GLUCAP 166* 191* 241* 196* 140*   Urine analysis:    Component Value Date/Time   COLORURINE YELLOW 04/18/2018 1522   APPEARANCEUR HAZY (A) 04/18/2018 1522   LABSPEC 1.017 04/18/2018 1522   PHURINE 5.0 04/18/2018 1522   GLUCOSEU NEGATIVE 04/18/2018 1522   HGBUR LARGE (A) 04/18/2018 1522   BILIRUBINUR NEGATIVE 04/18/2018 1522   KETONESUR NEGATIVE 04/18/2018 1522   PROTEINUR 100 (A) 04/18/2018 1522   UROBILINOGEN 0.2 12/01/2007 1025   NITRITE NEGATIVE 04/18/2018 1522   LEUKOCYTESUR MODERATE (A) 04/18/2018 1522   Scheduled Meds: . apixaban  5 mg Oral BID  . buPROPion  300 mg  Oral Daily  . diltiazem  30 mg Oral Q8H  . fluticasone  2 spray Each Nare Daily  . furosemide  40 mg Oral Daily  . insulin aspart  0-9 Units Subcutaneous TID WC  . metoprolol tartrate  150 mg Oral BID  . ondansetron (ZOFRAN) IV  4 mg Intravenous Q6H  . sodium chloride flush  3 mL Intravenous Q12H  . tiotropium  18 mcg Inhalation Daily   Continuous Infusions: . sodium chloride       LOS: 10 days    Time spent: 20 minutes    Barton Dubois, MD [facility] Pager [phone]  If 7PM-7AM, please contact night-coverage [email or web address] Password [facility] 04/24/2018, 4:57 PM

## 2018-04-25 LAB — GLUCOSE, CAPILLARY
GLUCOSE-CAPILLARY: 187 mg/dL — AB (ref 70–99)
GLUCOSE-CAPILLARY: 175 mg/dL — AB (ref 70–99)
Glucose-Capillary: 203 mg/dL — ABNORMAL HIGH (ref 70–99)
Glucose-Capillary: 193 mg/dL — ABNORMAL HIGH (ref 70–99)

## 2018-04-25 MED ORDER — ACETAMINOPHEN 325 MG PO TABS
650.0000 mg | ORAL_TABLET | Freq: Four times a day (QID) | ORAL | Status: DC | PRN
  Administered 2018-04-25 – 2018-04-26 (×2): 650 mg via ORAL
  Filled 2018-04-25 (×2): qty 2

## 2018-04-25 NOTE — Progress Notes (Signed)
CSW has not received insurance authorization for facility.  CSW will continue to follow.  Reed Breech LCSWA [phone]

## 2018-04-25 NOTE — Progress Notes (Signed)
PROGRESS NOTE    [name]  GNO:[id number] DOB: 1951/01/19 DOA: 04/13/2018 PCP: [name], MD     Brief Narrative:  67 year old female with a history of diastolic CHF, atrial fibrillation on anticoagulation, diabetes and chronic kidney disease, who was recently discharged the hospital on 8/27. At that time, she was advised to go to skilled nursing facility, but had initially wanted to go home. Upon returning home, patient had a fall while transferring from her wheelchair. She is suffered bilateral fractures in her lower extremities. Seen by orthopedics and plan is for nonoperative management. Seen by physical therapy who recommended skilled nursing facility placement.  Assessment & Plan: 1-bilateral lower extremity fractures status post mechanical fall: -Patient seen by orthopedic service and decision for management has been nonoperative. -Patient will be nonweightbearing on both lower extremities -Continue as needed pain medication -Right ankle bimalleolar fracture will be treated with a cam walker.  Left proximal tibia fracture will be treated with immobilization with immobilizer.  Physical therapy has recommended skilled nursing facility placement for further care and rehabilitation. -Patient and family in agreement.  2-diabetes type 2, uncontrolled (HCC) -Overall stable. -Will continue sliding scale insulin while inpatient -Plan is to resume glipizide at discharge.  3-HTN (hypertension), benign -Overall well controlled -Continue current antihypertensive regimen. -Heart healthy diet has been discussed with patient.  4-chronic atrial fibrillation (HCC) -Continue Cardizem and metoprolol for rate control -Continue Eliquis for secondary prevention.  5-patient with history of left atrial thrombus -Continue anticoagulation with Eliquis.  6-chronic kidney disease is stage III -Creatinine at baseline -Continue to follow renal function trend  intermittently  7-Chronic diastolic CHF (congestive heart failure) (HCC) -Stable and compensated. -Continue home dose of Lasix -Heart healthy diet has been discussed with patient. -Recommend daily weights.  8-history of visual hallucinations and delirium: -Robaxin has been discontinue -Urinalysis without any signs of acute infection. -Continue to use the least amount needed for pain medication and provide Constant reorientation. -no further Hallucinations has been described at this moment  9-depression -No suicidal ideation  -Cleared by psychiatry service. -Continue Wellbutrin.    DVT prophylaxis: Eliquis. Code Status: Full code Family Communication: Husband at bedside. Disposition Plan: Pending insurance authorization in order to discharge her to skilled nursing facility. Patient is medically stable.  Consultants:   Orthopedic service  Psychiatry  Procedures:   See below for x-ray reports.  Antimicrobials:  Anti-infectives (From admission, onward)   None      Subjective: No overnight events.  Patient denies chest pain, no nausea, no vomiting, no palpitations, no abdominal pain.  Oriented x3, appropriately following commands and in no acute distress.  Objective: Vitals:   04/25/18 0546 04/25/18 0755 04/25/18 1300 04/25/18 1323  BP:      Pulse: 95     Resp:      Temp:      TempSrc:      SpO2: 98% (!) 86% 100% 98%  Weight:      Height:        Intake/Output Summary (Last 24 hours) at 04/25/2018 1427 Last data filed at 04/25/2018 0600 Gross per 24 hour  Intake 600 ml  Output 500 ml  Net 100 ml   Filed Weights   04/15/18 1502  Weight: 120.5 kg    Examination: General exam: Alert, awake, oriented x 3; afebrile, in no acute distress, denies chest pain, no shortness of breath, no palpitations.  No further hallucinations described.  Respiratory system: Clear to auscultation. Respiratory effort normal. Cardiovascular system:RRR. No murmurs,  rubs,  gallops. Gastrointestinal system: Abdomen is obese, nondistended, soft and nontender. No organomegaly or masses felt. Normal bowel sounds heard. Central nervous system: Alert and oriented. No focal neurological deficits. Extremities: No cyanosis, no clubbing.  Cast on her right lower extremity and immobilizer device on her left leg.  Patient remains nonweightbearing.  There is no signs or concern for compartment syndrome. Skin: No rashes, lesions or ulcers Psychiatry: Judgement and insight appear normal. Mood & affect appropriate.   Data Reviewed: I have personally reviewed following labs and imaging studies  CBC: Recent Labs  Lab 04/19/18 0629 04/21/18 0556  WBC 10.3 9.9  HGB 9.7* 9.9*  HCT 33.7* 34.0*  MCV 91.1 89.9  PLT 350 864   Basic Metabolic Panel: Recent Labs  Lab 04/19/18 0629 04/21/18 0556  NA 137 139  K 4.2 3.5  CL 98 97*  CO2 26 32  GLUCOSE 180* 168*  BUN 42* 28*  CREATININE 1.67* 1.13*  CALCIUM 8.0* 8.5*   GFR: Estimated Creatinine Clearance: 61.8 mL/min (A) (by C-G formula based on SCr of 1.13 mg/dL (H)).  CBG: Recent Labs  Lab 04/24/18 1146 04/24/18 1633 04/24/18 2018 04/25/18 0756 04/25/18 1139  GLUCAP 196* 140* 228* 203* 187*   Urine analysis:    Component Value Date/Time   COLORURINE YELLOW 04/18/2018 1522   APPEARANCEUR HAZY (A) 04/18/2018 1522   LABSPEC 1.017 04/18/2018 1522   PHURINE 5.0 04/18/2018 1522   GLUCOSEU NEGATIVE 04/18/2018 1522   HGBUR LARGE (A) 04/18/2018 1522   BILIRUBINUR NEGATIVE 04/18/2018 1522   KETONESUR NEGATIVE 04/18/2018 1522   PROTEINUR 100 (A) 04/18/2018 1522   UROBILINOGEN 0.2 12/01/2007 1025   NITRITE NEGATIVE 04/18/2018 1522   LEUKOCYTESUR MODERATE (A) 04/18/2018 1522   Scheduled Meds: . apixaban  5 mg Oral BID  . buPROPion  300 mg Oral Daily  . diltiazem  30 mg Oral Q8H  . fluticasone  2 spray Each Nare Daily  . furosemide  40 mg Oral Daily  . insulin aspart  0-9 Units Subcutaneous TID WC  .  metoprolol tartrate  150 mg Oral BID  . ondansetron (ZOFRAN) IV  4 mg Intravenous Q6H  . sodium chloride flush  3 mL Intravenous Q12H  . tiotropium  18 mcg Inhalation Daily   Continuous Infusions: . sodium chloride       LOS: 11 days    Time spent: 20 minutes    Barton Dubois, MD [facility] Pager [phone]  If 7PM-7AM, please contact night-coverage [email or web address] Password [facility] 04/25/2018, 2:27 PM

## 2018-04-26 LAB — BASIC METABOLIC PANEL
Anion gap: 10 (ref 5–15)
BUN: 18 mg/dL (ref 8–23)
CO2: 34 mmol/L — ABNORMAL HIGH (ref 22–32)
CREATININE: 1.05 mg/dL — AB (ref 0.44–1.00)
Calcium: 7.1 mg/dL — ABNORMAL LOW (ref 8.9–10.3)
Chloride: 95 mmol/L — ABNORMAL LOW (ref 98–111)
GFR calc non Af Amer: 54 mL/min — ABNORMAL LOW (ref >60)
Glucose, Bld: 208 mg/dL — ABNORMAL HIGH (ref 70–99)
Potassium: 3.1 mmol/L — ABNORMAL LOW (ref 3.5–5.1)
SODIUM: 139 mmol/L (ref 135–145)

## 2018-04-26 LAB — GLUCOSE, CAPILLARY
GLUCOSE-CAPILLARY: 189 mg/dL — AB (ref 70–99)
Glucose-Capillary: 210 mg/dL — ABNORMAL HIGH (ref 70–99)
Glucose-Capillary: 174 mg/dL — ABNORMAL HIGH (ref 70–99)
Glucose-Capillary: 151 mg/dL — ABNORMAL HIGH (ref 70–99)

## 2018-04-26 LAB — CBC
HCT: 32.9 % — ABNORMAL LOW (ref 36.0–46.0)
Hemoglobin: 9.6 g/dL — ABNORMAL LOW (ref 12.0–15.0)
MCH: 26.3 pg (ref 26.0–34.0)
MCHC: 29.2 g/dL — AB (ref 30.0–36.0)
MCV: 90.1 fL (ref 78.0–100.0)
PLATELETS: 340 10*3/uL (ref 150–400)
RBC: 3.65 MIL/uL — ABNORMAL LOW (ref 3.87–5.11)
RDW: 16.2 % — AB (ref 11.5–15.5)
WBC: 9.9 10*3/uL (ref 4.0–10.5)

## 2018-04-26 MED ORDER — POTASSIUM CHLORIDE CRYS ER 20 MEQ PO TBCR
40.0000 meq | EXTENDED_RELEASE_TABLET | Freq: Once | ORAL | Status: AC
  Administered 2018-04-26: 40 meq via ORAL
  Filled 2018-04-26: qty 2

## 2018-04-26 NOTE — Progress Notes (Addendum)
PROGRESS NOTE    [name]  GYB:[id number] DOB: [birth year] DOA: 04/13/2018 PCP: [name], MD     Brief Narrative:  67 year old female with a history of diastolic CHF, atrial fibrillation on anticoagulation, diabetes and chronic kidney disease, who was recently discharged the hospital on 8/27. At that time, she was advised to go to skilled nursing facility, but had initially wanted to go home. Upon returning home, patient had a fall while transferring from her wheelchair. She is suffered bilateral fractures in her lower extremities. Seen by orthopedics and plan is for nonoperative management. Seen by physical therapy who recommended skilled nursing facility placement.  Assessment & Plan: 1-bilateral lower extremity fractures status post mechanical fall: -Patient seen by orthopedic service and decision for management has been nonoperative. -Patient will be nonweightbearing on both lower extremities -Continue as needed pain medication -Right ankle bimalleolar fracture will be treated with a cam walker.  Left proximal tibia fracture will be treated with immobilization with immobilizer.  Physical therapy has recommended skilled nursing facility placement for further care and rehabilitation. -Patient and family in agreement.  2-diabetes type 2, uncontrolled (HCC) -Overall stable. -Will continue sliding scale insulin while inpatient -Plan is to resume glipizide at discharge.  3-HTN (hypertension), benign -Overall well controlled -Continue current antihypertensive regimen. -Heart healthy diet has been discussed with patient.  4-chronic atrial fibrillation (HCC) -Continue Cardizem and metoprolol for rate control -Continue Eliquis for secondary prevention.  5-patient with history of left atrial thrombus -Continue anticoagulation with Eliquis.  6-chronic kidney disease is stage III -Creatinine at baseline -Continue to follow renal function trend  intermittently  7-Chronic diastolic CHF (congestive heart failure) (HCC) -Stable and compensated. -Continue home dose of Lasix -Heart healthy diet has been discussed with patient. -Recommend daily weights.  8-history of visual hallucinations and delirium: -Robaxin has been discontinue -Urinalysis without any signs of acute infection. -Continue to use the least amount needed for pain medication and provide Constant reorientation. -no further Hallucinations has been described at this moment  9-depression -No suicidal ideation  -Cleared by psychiatry service. -Continue Wellbutrin.  10-mild hypokalemia -K 3.1 -will repleted with KDUR 34m X 1 -most likely associated with use of diuretics.    DVT prophylaxis: Eliquis. Code Status: Full code Family Communication: Husband at bedside. Disposition Plan: Pending insurance authorization in order to discharge her to skilled nursing facility for further care and rehab. Patient remains medically stable.  Consultants:   Orthopedic service  Psychiatry  Procedures:   See below for x-ray reports.  Antimicrobials:  Anti-infectives (From admission, onward)   None      Subjective: No overnight events.  Patient denies chest pain, shortness of breath, nausea, vomiting, headaches, dysuria or any other complaints.  She is afebrile and in no acute distress.  Objective: Vitals:   04/26/18 0642 04/26/18 0834 04/26/18 0848 04/26/18 1341  BP: (!) 124/51  (!) 115/55 (!) 115/59  Pulse: 70  63 68  Resp: 18   18  Temp: 98 F (36.7 C)   97.7 F (36.5 C)  TempSrc: Oral   Oral  SpO2: 100% 97%  99%  Weight:      Height:        Intake/Output Summary (Last 24 hours) at 04/26/2018 1655 Last data filed at 04/26/2018 1200 Gross per 24 hour  Intake 360 ml  Output 800 ml  Net -440 ml   Filed Weights   04/15/18 1502  Weight: 120.5 kg    Examination: General exam: Alert, awake, oriented x 3;  denies chest pain, shortness of breath, no  nausea, no vomiting.  No further hallucinations has been reported.  Patient expressed being frustrated and discouraged with current ongoing insurance situation. Respiratory system: Clear to auscultation. Respiratory effort normal. Cardiovascular system:Rate controlled, No murmurs, rubs or gallops. Gastrointestinal system: Abdomen is soft, nondistended, soft and nontender. No organomegaly or masses felt. Normal bowel sounds heard. Central nervous system: Alert and oriented. No focal neurological deficits. Extremities: No cyanosis, no clubbing, no signs of compartment syndrome; good capillary refill appreciated on her right lower toes.  Cast on her right lower extremity in immobilizer device on her left leg remains in place.   Skin: No rashes, lesions or ulcers. Psychiatry: Judgement and insight appear normal. Mood & affect appropriate.    Data Reviewed: I have personally reviewed following labs and imaging studies  CBC: Recent Labs  Lab 04/21/18 0556 04/26/18 0510  WBC 9.9 9.9  HGB 9.9* 9.6*  HCT 34.0* 32.9*  MCV 89.9 90.1  PLT 363 503   Basic Metabolic Panel: Recent Labs  Lab 04/21/18 0556 04/26/18 0510  NA 139 139  K 3.5 3.1*  CL 97* 95*  CO2 32 34*  GLUCOSE 168* 208*  BUN 28* 18  CREATININE 1.13* 1.05*  CALCIUM 8.5* 7.1*   GFR: Estimated Creatinine Clearance: 66.5 mL/min (A) (by C-G formula based on SCr of 1.05 mg/dL (H)).  CBG: Recent Labs  Lab 04/25/18 1649 04/25/18 2216 04/26/18 0741 04/26/18 1118 04/26/18 1606  GLUCAP 193* 175* 189* 210* 174*   Urine analysis:    Component Value Date/Time   COLORURINE YELLOW 04/18/2018 1522   APPEARANCEUR HAZY (A) 04/18/2018 1522   LABSPEC 1.017 04/18/2018 1522   PHURINE 5.0 04/18/2018 1522   GLUCOSEU NEGATIVE 04/18/2018 1522   HGBUR LARGE (A) 04/18/2018 1522   BILIRUBINUR NEGATIVE 04/18/2018 1522   KETONESUR NEGATIVE 04/18/2018 1522   PROTEINUR 100 (A) 04/18/2018 1522   UROBILINOGEN 0.2 12/01/2007 1025   NITRITE  NEGATIVE 04/18/2018 1522   LEUKOCYTESUR MODERATE (A) 04/18/2018 1522   Scheduled Meds: . apixaban  5 mg Oral BID  . buPROPion  300 mg Oral Daily  . diltiazem  30 mg Oral Q8H  . fluticasone  2 spray Each Nare Daily  . furosemide  40 mg Oral Daily  . insulin aspart  0-9 Units Subcutaneous TID WC  . metoprolol tartrate  150 mg Oral BID  . ondansetron (ZOFRAN) IV  4 mg Intravenous Q6H  . sodium chloride flush  3 mL Intravenous Q12H  . tiotropium  18 mcg Inhalation Daily   Continuous Infusions: . sodium chloride       LOS: 12 days    Time spent: 20 minutes    Barton Dubois, MD [facility] Pager [phone]  If 7PM-7AM, please contact night-coverage [email or web address] Password [email or web address] 04/26/2018, 4:55 PM

## 2018-04-26 NOTE — Clinical Social Work Note (Signed)
Hinton Dyer at [facility] indicated that patient's authorization is in pending status and that BC[place] stated that they had up to 15 days to make a decision regarding authorization request.     Kirti Carl, Clydene Pugh, LCSW

## 2018-04-27 DIAGNOSIS — N183 Chronic kidney disease, stage 3 (moderate): Secondary | ICD-10-CM | POA: Diagnosis not present

## 2018-04-27 DIAGNOSIS — R278 Other lack of coordination: Secondary | ICD-10-CM | POA: Diagnosis not present

## 2018-04-27 DIAGNOSIS — I4891 Unspecified atrial fibrillation: Secondary | ICD-10-CM | POA: Diagnosis not present

## 2018-04-27 DIAGNOSIS — S82401D Unspecified fracture of shaft of right fibula, subsequent encounter for closed fracture with routine healing: Secondary | ICD-10-CM | POA: Diagnosis not present

## 2018-04-27 DIAGNOSIS — I5032 Chronic diastolic (congestive) heart failure: Secondary | ICD-10-CM | POA: Diagnosis not present

## 2018-04-27 DIAGNOSIS — Z9181 History of falling: Secondary | ICD-10-CM | POA: Diagnosis not present

## 2018-04-27 DIAGNOSIS — M6281 Muscle weakness (generalized): Secondary | ICD-10-CM | POA: Diagnosis not present

## 2018-04-27 DIAGNOSIS — F339 Major depressive disorder, recurrent, unspecified: Secondary | ICD-10-CM | POA: Diagnosis not present

## 2018-04-27 DIAGNOSIS — S82232D Displaced oblique fracture of shaft of left tibia, subsequent encounter for closed fracture with routine healing: Secondary | ICD-10-CM | POA: Diagnosis not present

## 2018-04-27 DIAGNOSIS — R293 Abnormal posture: Secondary | ICD-10-CM | POA: Diagnosis not present

## 2018-04-27 DIAGNOSIS — S7292XA Unspecified fracture of left femur, initial encounter for closed fracture: Secondary | ICD-10-CM | POA: Diagnosis not present

## 2018-04-27 DIAGNOSIS — E119 Type 2 diabetes mellitus without complications: Secondary | ICD-10-CM | POA: Diagnosis not present

## 2018-04-27 DIAGNOSIS — R5381 Other malaise: Secondary | ICD-10-CM | POA: Diagnosis not present

## 2018-04-27 DIAGNOSIS — E1165 Type 2 diabetes mellitus with hyperglycemia: Secondary | ICD-10-CM | POA: Diagnosis not present

## 2018-04-27 DIAGNOSIS — S82102A Unspecified fracture of upper end of left tibia, initial encounter for closed fracture: Secondary | ICD-10-CM | POA: Diagnosis not present

## 2018-04-27 DIAGNOSIS — I959 Hypotension, unspecified: Secondary | ICD-10-CM | POA: Diagnosis not present

## 2018-04-27 DIAGNOSIS — Z7401 Bed confinement status: Secondary | ICD-10-CM | POA: Diagnosis not present

## 2018-04-27 DIAGNOSIS — I10 Essential (primary) hypertension: Secondary | ICD-10-CM | POA: Diagnosis not present

## 2018-04-27 LAB — GLUCOSE, CAPILLARY
GLUCOSE-CAPILLARY: 193 mg/dL — AB (ref 70–99)
Glucose-Capillary: 190 mg/dL — ABNORMAL HIGH (ref 70–99)

## 2018-04-27 MED ORDER — POTASSIUM CHLORIDE CRYS ER 20 MEQ PO TBCR: 20.0000 meq | EXTENDED_RELEASE_TABLET | Freq: Every day | ORAL | Status: DC

## 2018-04-27 MED ORDER — POTASSIUM CHLORIDE CRYS ER 20 MEQ PO TBCR
40.0000 meq | EXTENDED_RELEASE_TABLET | Freq: Every day | ORAL | Status: DC
  Administered 2018-04-27: 40 meq via ORAL
  Filled 2018-04-27 (×2): qty 2

## 2018-04-27 NOTE — Care Management Important Message (Signed)
Important Message  Patient Details  Name: [name] MRN: [id number] Date of Birth: September 01, 1950   Medicare Important Message Given:  Yes    Sherald Barge, RN 04/27/2018, 12:52 PM

## 2018-04-27 NOTE — Discharge Summary (Signed)
Physician Discharge Summary  [name] HEN:[id number] DOB: 1951/06/10 DOA: 04/13/2018  PCP: [name], MD  Admit date: 04/13/2018 Discharge date: 04/27/2018  Admitted From: home Disposition:  SNF  Recommendations for Outpatient Follow-up:  1. Follow up with PCP in 1-2 weeks 2. Please obtain BMP/CBC in one week 3. Follow-up with orthopedic surgery in 4 to 6 weeks she is nonweightbearing until then.  Home Health:No Equipment/Devices:None  Discharge Condition:stable CODE STATUS:full Diet recommendation: Heart Healthy  Brief/Interim Summary: [age]-year-old with past medical history of chronic diastolic heart failure, chronic atrial fibrillation on anticoagulation, diabetes mellitus, chronic kidney disease stage III, was discharged on 04/13/2018 during this time she was advised to go to skilled nursing facility but she did refused at home from the bed to the wheelchair when she fell to the floor and had bilateral tibial fractures.  Discharge Diagnoses:  Active Problems:   Diabetes type 2, uncontrolled (HCC)   HTN (hypertension), benign   Left tibial fracture   Unspecified atrial fibrillation (HCC)   Chronic diastolic CHF (congestive heart failure) (HCC)   CKD (chronic kidney disease), stage III (HCC)   Closed right fibular fracture   Bilateral lower extremity fracture status post fall: Seen by orthopedic recommended nonweightbearing nonoperative management.  They recommended also to continue pain control and follow-up with them in 4 to 6 weeks as an outpatient. Physical therapy evaluated the patient the recommended skilled nursing facility.  Chronic diastolic heart failure: No changes were made to his medication she appeared to be euvolemic.  Chronic atrial fibrillation: She remains rate controlled continue current medication Cardizem metoprolol and Eliquis.  Left atrial thrombus: Recently diagnosed on her admission on 04/13/2018 we will continue  Eliquis.  Diabetes mellitus type 2: No changes made to her medication.  Chronic kidney disease stage III: Creatinine remained at baseline.   Discharge Instructions  Discharge Instructions    Diet - low sodium heart healthy   Complete by:  As directed    Diet - low sodium heart healthy   Complete by:  As directed    Diet Carb Modified   Complete by:  As directed    Discharge instructions   Complete by:  As directed    No weight bearing  Maintain adequate hydration  Follow up with orthopedic service as an outpatient   Increase activity slowly   Complete by:  As directed      Allergies as of 04/27/2018      Reactions   Latex Itching, Rash      Medication List    TAKE these medications   acetaminophen 500 MG tablet Commonly known as:  TYLENOL Take 1,500-2,000 mg by mouth every 6 (six) hours as needed for mild pain or headache.   apixaban 5 MG Tabs tablet Commonly known as:  ELIQUIS Take 1 tablet (5 mg total) by mouth 2 (two) times daily. What changed:  Another medication with the same name was removed. Continue taking this medication, and follow the directions you see here.   buPROPion 300 MG 24 hr tablet Commonly known as:  WELLBUTRIN XL Take 300 mg by mouth daily.   cetirizine 10 MG tablet Commonly known as:  ZYRTEC Take 10 mg by mouth daily as needed for allergies.   diltiazem 30 MG tablet Commonly known as:  CARDIZEM Take 1 tablet (30 mg total) by mouth every 8 (eight) hours.   fluticasone 50 MCG/ACT nasal spray Commonly known as:  FLONASE Place 2 sprays into both nostrils daily.   furosemide  40 MG tablet Commonly known as:  LASIX Take 1 tablet (40 mg total) by mouth daily.   glipiZIDE 10 MG tablet Commonly known as:  GLUCOTROL Take 10 mg by mouth 2 (two) times daily.   Metoprolol Tartrate 75 MG Tabs Take 150 mg by mouth 2 (two) times daily.   oxyCODONE-acetaminophen 5-325 MG tablet Commonly known as:  PERCOCET/ROXICET Take 1 tablet by mouth  every 6 (six) hours as needed for moderate pain.   potassium chloride SA 20 MEQ tablet Commonly known as:  K-DUR,KLOR-CON Take 1 tablet (20 mEq total) by mouth daily.   SPIRIVA RESPIMAT 1.25 MCG/ACT Aers Generic drug:  Tiotropium Bromide Monohydrate Inhale 2 sprays into the lungs daily.      Contact information for after-discharge care    [facility] .   Service:  Skilled Nursing Contact information: [address] [address] [phone]             Allergies  Allergen Reactions  . Latex Itching and Rash    Consultations:  None   Procedures/Studies: Dg Chest 2 View  Result Date: 04/10/2018 CLINICAL DATA:  Recent diagnosis of atrial fibrillation. Chest pain and shortness of breath. EXAM: CHEST - 2 VIEW COMPARISON:  April 01, 2018 FINDINGS: Stable cardiomegaly. The hila and mediastinum are unchanged. No pneumothorax. No pulmonary nodules or masses. No focal infiltrates. No overt edema. IMPRESSION: Cardiomegaly without overt edema. Electronically Signed   By: Dorise Bullion III M.D   On: 04/10/2018 14:00   Dg Chest 2 View  Result Date: 04/01/2018 CLINICAL DATA:  Shortness of breath EXAM: CHEST - 2 VIEW COMPARISON:  03/29/2018, 02/19/2016 FINDINGS: Mild cardiomegaly with vascular congestion and mild diffuse interstitial opacities suggesting minimal edema. No pleural effusion. Possible small anterior lower lobe infiltrate on the lateral view. Aortic atherosclerosis. No pneumothorax. IMPRESSION: 1. Cardiomegaly with vascular congestion and mild interstitial edema 2. Possible small anterior lung base infiltrate on the lateral view. Electronically Signed   By: Donavan Foil M.D.   On: 04/01/2018 18:48   Dg Chest 2 View  Result Date: 03/29/2018 CLINICAL DATA:  Acute shortness of breath with cough for 1 week. EXAM: CHEST - 2 VIEW COMPARISON:  02/19/2016 FINDINGS: Cardiomegaly and pulmonary vascular congestion noted.  There is no evidence of focal airspace disease,, pulmonary edema, suspicious pulmonary nodule/mass, pleural effusion, or pneumothorax. No acute bony abnormalities are identified. IMPRESSION: Cardiomegaly with pulmonary vascular congestion. Electronically Signed   By: Margarette Canada M.D.   On: 03/29/2018 14:16   Dg Tibia/fibula Left  Result Date: 04/14/2018 CLINICAL DATA:  Fall.  Left lower leg pain EXAM: LEFT TIBIA AND FIBULA - 2 VIEW COMPARISON:  02/19/2016 FINDINGS: There is a fracture through the the proximal left tibial metaphysis and proximal shaft. Proximal fibular fracture also likely present. Hardware noted in the distal femur. Advanced degenerative changes in the left knee. IMPRESSION: Minimally displaced proximal left tibial and fibular fractures. Electronically Signed   By: Rolm Baptise M.D.   On: 04/14/2018 00:35   Dg Ankle Complete Right  Result Date: 04/14/2018 CLINICAL DATA:  Fall, right ankle pain EXAM: RIGHT ANKLE - COMPLETE 3+ VIEW COMPARISON:  None. FINDINGS: Fractures are noted in the distal fibular metaphysis and base of medial malleolus. Minimal displacement. Ankle mortise is intact. Diffuse vascular calcifications. Soft tissue calcifications and soft tissue swelling. IMPRESSION: Distal fibular metaphyseal and medial malleolar fractures. Electronically Signed   By: Rolm Baptise M.D.  On: 04/14/2018 00:37   US Renal  Result Date: 04/05/2018 CLINICAL DATA:  Acute renal injury EXAM: RENAL / URINARY TRACT ULTRASOUND COMPLETE COMPARISON:  None. FINDINGS: Right Kidney: Length: 8.9 cm. Echogenicity within normal limits. No mass or hydronephrosis visualized. Left Kidney: Length: 10.3 cm. Echogenicity within normal limits. No mass or hydronephrosis visualized. Bladder: Appears normal for degree of bladder distention. IMPRESSION: Difficult visualization of the kidneys due to habitus. No hydronephrosis. Electronically Signed   By: Donavan Foil M.D.   On: 04/05/2018 00:50   Dg Chest Port 1  View  Result Date: 04/14/2018 CLINICAL DATA:  66 year old female with a history of hypoxia EXAM: PORTABLE CHEST 1 VIEW COMPARISON:  04/10/2018 FINDINGS: Cardiomediastinal silhouette unchanged in size and contour. No evidence of central vascular congestion. No pneumothorax or pleural effusion. Low lung volumes. Similar appearance of coarsened interstitial markings. No confluent airspace disease. No displaced fracture. IMPRESSION: No evidence of acute cardiopulmonary disease. Electronically Signed   By: Corrie Mckusick D.O.   On: 04/14/2018 14:13   Dg Hip Unilat W Or Wo Pelvis 2-3 Views Left  Result Date: 04/14/2018 CLINICAL DATA:  Fall, left leg pain EXAM: DG HIP (WITH OR WITHOUT PELVIS) 2-3V LEFT COMPARISON:  None. FINDINGS: Hardware noted within the left femur with intramedullary nail. Mild degenerative changes in the hips bilaterally. SI joints are symmetric and unremarkable. No acute bony abnormality. Specifically, no fracture, subluxation, or dislocation. IMPRESSION: No acute bony abnormality. Electronically Signed   By: Rolm Baptise M.D.   On: 04/14/2018 00:36     Subjective: No new complaints she relates her pain is controlled.  Discharge Exam: Vitals:   04/27/18 0740 04/27/18 0853  BP:  137/73  Pulse:  70  Resp:    Temp:    SpO2: 98%    Vitals:   04/26/18 2111 04/27/18 0542 04/27/18 0740 04/27/18 0853  BP: 124/73 114/63  137/73  Pulse: 67 95  70  Resp: 20 16    Temp: 98 F (36.7 C) 98.4 F (36.9 C)    TempSrc: Oral Oral    SpO2: 100% (!) 89% 98%   Weight:      Height:        General: Pt is alert, awake, not in acute distress Cardiovascular: RRR, S1/S2 +, no rubs, no gallops Respiratory: CTA bilaterally, no wheezing, no rhonchi Abdominal: Soft, NT, ND, bowel sounds + Extremities: no edema, no cyanosis    The results of significant diagnostics from this hospitalization (including imaging, microbiology, ancillary and laboratory) are listed below for reference.      Microbiology: No results found for this or any previous visit (from the past 240 hour(s)).   Labs: BNP (last 3 results) Recent Labs    04/10/18 1315  BNP 9,179.1*   Basic Metabolic Panel: Recent Labs  Lab 04/21/18 0556 04/26/18 0510  NA 139 139  K 3.5 3.1*  CL 97* 95*  CO2 32 34*  GLUCOSE 168* 208*  BUN 28* 18  CREATININE 1.13* 1.05*  CALCIUM 8.5* 7.1*   Liver Function Tests: No results for input(s): AST, ALT, ALKPHOS, BILITOT, PROT, ALBUMIN in the last 168 hours. No results for input(s): LIPASE, AMYLASE in the last 168 hours. No results for input(s): AMMONIA in the last 168 hours. CBC: Recent Labs  Lab 04/21/18 0556 04/26/18 0510  WBC 9.9 9.9  HGB 9.9* 9.6*  HCT 34.0* 32.9*  MCV 89.9 90.1  PLT 363 340   Cardiac Enzymes: No results for input(s): CKTOTAL, CKMB, CKMBINDEX, TROPONINI in  the last 168 hours. BNP: Invalid input(s): POCBNP CBG: Recent Labs  Lab 04/26/18 0741 04/26/18 1118 04/26/18 1606 04/26/18 2204 04/27/18 0728  GLUCAP 189* 210* 174* 151* 190*   D-Dimer No results for input(s): DDIMER in the last 72 hours. Hgb A1c No results for input(s): HGBA1C in the last 72 hours. Lipid Profile No results for input(s): CHOL, HDL, LDLCALC, TRIG, CHOLHDL, LDLDIRECT in the last 72 hours. Thyroid function studies No results for input(s): TSH, T4TOTAL, T3FREE, THYROIDAB in the last 72 hours.  Invalid input(s): FREET3 Anemia work up No results for input(s): VITAMINB12, FOLATE, FERRITIN, TIBC, IRON, RETICCTPCT in the last 72 hours. Urinalysis    Component Value Date/Time   COLORURINE YELLOW 04/18/2018 1522   APPEARANCEUR HAZY (A) 04/18/2018 1522   LABSPEC 1.017 04/18/2018 1522   PHURINE 5.0 04/18/2018 1522   GLUCOSEU NEGATIVE 04/18/2018 1522   HGBUR LARGE (A) 04/18/2018 1522   BILIRUBINUR NEGATIVE 04/18/2018 1522   KETONESUR NEGATIVE 04/18/2018 1522   PROTEINUR 100 (A) 04/18/2018 1522   UROBILINOGEN 0.2 12/01/2007 1025   NITRITE NEGATIVE  04/18/2018 1522   LEUKOCYTESUR MODERATE (A) 04/18/2018 1522   Sepsis Labs Invalid input(s): PROCALCITONIN,  WBC,  LACTICIDVEN Microbiology No results found for this or any previous visit (from the past 240 hour(s)).   Time coordinating discharge: 40 minutes  SIGNED:   Charlynne Cousins, MD  [facility] 04/27/2018, 10:47 AM Pager   If 7PM-7AM, please contact night-coverage [email or web address] Password [email or web address]

## 2018-04-27 NOTE — Care Management Note (Signed)
Case Management Note  Patient Details  Name: [name] MRN: [id number] Date of Birth: [birth year]  If discussed at [facility] Length of Stay Meetings, dates discussed: 04/27/18   Additional Comments:  Sherald Barge, RN 04/27/2018, 12:55 PM

## 2018-04-27 NOTE — Progress Notes (Signed)
Pt's IV catheter removed and intact. Pt's IV site clean dry and intact. Report called and given to [place], [place] All questions were answered and no further questions at this time. Pt transported via [facility].

## 2018-04-27 NOTE — Clinical Social Work Placement (Signed)
   CLINICAL SOCIAL WORK PLACEMENT  NOTE  Date:  04/27/2018  Patient Details  Name: [name] MRN: [id number] Date of Birth: 18-Aug-1951  Clinical Social Work is seeking post-discharge placement for this patient at the [place] level of care (*CSW will initial, date and re-position this form in  chart as items are completed):  Yes   Patient/family provided with [facility] Work Department's list of facilities offering this level of care within the geographic area requested by the patient (or if unable, by the patient's family).  Yes   Patient/family informed of their freedom to choose among providers that offer the needed level of care, that participate in Medicare, Medicaid or managed care program needed by the patient, have an available bed and are willing to accept the patient.  Yes   Patient/family informed of [facility]'s ownership interest in [facility] and [facility], as well as of the fact that they are under no obligation to receive care at these facilities.  PASRR submitted to EDS on       PASRR number received on       Existing PASRR number confirmed on 04/16/18     FL2 transmitted to all facilities in geographic area requested by pt/family on 04/16/18     FL2 transmitted to all facilities within larger geographic area on       Patient informed that his/her managed care company has contracts with or will negotiate with certain facilities, including the following:        Yes   Patient/family informed of bed offers received.  Patient chooses bed at [facility]     Physician recommends and patient chooses bed at      Patient to be transferred to [facility] on 04/27/18.  Patient to be transferred to facility by [facility]     Patient family notified on 04/27/18 of transfer.  Name of family member notified:  husband, [name]     PHYSICIAN       Additional Comment:  Discharge clinicals sent. LCSW signing off.     _______________________________________________ Ihor Gully, LCSW 04/27/2018, 12:50 PM

## 2018-04-29 DIAGNOSIS — I4891 Unspecified atrial fibrillation: Secondary | ICD-10-CM | POA: Diagnosis not present

## 2018-04-29 DIAGNOSIS — E119 Type 2 diabetes mellitus without complications: Secondary | ICD-10-CM | POA: Diagnosis not present

## 2018-04-29 DIAGNOSIS — I10 Essential (primary) hypertension: Secondary | ICD-10-CM | POA: Diagnosis not present

## 2018-04-29 DIAGNOSIS — S82401D Unspecified fracture of shaft of right fibula, subsequent encounter for closed fracture with routine healing: Secondary | ICD-10-CM | POA: Diagnosis not present

## 2018-04-30 ENCOUNTER — Telehealth: Payer: Self-pay | Admitting: Orthopedic Surgery

## 2018-04-30 NOTE — Telephone Encounter (Signed)
Patient is at [facility] in [place].  She has bilateral fx ankles.  Discharge states for her to see Dr. Aline Brochure in 6 weeks.  Appointment has been given for Monday, October 21st.  It was unclear if patient needs an xray at that time.    She was already in a wheelchair and nonweight bearing previously. Therefore, the questions are:  Will she need xray prior and do you want those done by [facility]?  If [facility] is going to do those we will need to get with [facility] in [place], [phone]) to let them know of this.  If [facility] is used, we need to make sure they know to put images on a disk to send to [place]  Thanks

## 2018-04-30 NOTE — Telephone Encounter (Signed)
We can xray here at follow up

## 2018-05-06 ENCOUNTER — Ambulatory Visit: Payer: Self-pay | Admitting: Cardiology

## 2018-05-10 DIAGNOSIS — Z9181 History of falling: Secondary | ICD-10-CM | POA: Diagnosis not present

## 2018-05-10 DIAGNOSIS — S7292XA Unspecified fracture of left femur, initial encounter for closed fracture: Secondary | ICD-10-CM | POA: Diagnosis not present

## 2018-05-11 DIAGNOSIS — R5381 Other malaise: Secondary | ICD-10-CM | POA: Diagnosis not present

## 2018-05-11 DIAGNOSIS — Z7401 Bed confinement status: Secondary | ICD-10-CM | POA: Diagnosis not present

## 2018-05-12 DIAGNOSIS — I5032 Chronic diastolic (congestive) heart failure: Secondary | ICD-10-CM | POA: Diagnosis not present

## 2018-05-12 DIAGNOSIS — I482 Chronic atrial fibrillation: Secondary | ICD-10-CM | POA: Diagnosis not present

## 2018-05-12 DIAGNOSIS — N183 Chronic kidney disease, stage 3 (moderate): Secondary | ICD-10-CM | POA: Diagnosis not present

## 2018-05-12 DIAGNOSIS — I513 Intracardiac thrombosis, not elsewhere classified: Secondary | ICD-10-CM | POA: Diagnosis not present

## 2018-05-12 DIAGNOSIS — S72402D Unspecified fracture of lower end of left femur, subsequent encounter for closed fracture with routine healing: Secondary | ICD-10-CM | POA: Diagnosis not present

## 2018-05-12 DIAGNOSIS — F329 Major depressive disorder, single episode, unspecified: Secondary | ICD-10-CM | POA: Diagnosis not present

## 2018-05-12 DIAGNOSIS — S82832D Other fracture of upper and lower end of left fibula, subsequent encounter for closed fracture with routine healing: Secondary | ICD-10-CM | POA: Diagnosis not present

## 2018-05-12 DIAGNOSIS — M810 Age-related osteoporosis without current pathological fracture: Secondary | ICD-10-CM | POA: Diagnosis not present

## 2018-05-12 DIAGNOSIS — I130 Hypertensive heart and chronic kidney disease with heart failure and stage 1 through stage 4 chronic kidney disease, or unspecified chronic kidney disease: Secondary | ICD-10-CM | POA: Diagnosis not present

## 2018-05-12 DIAGNOSIS — E1122 Type 2 diabetes mellitus with diabetic chronic kidney disease: Secondary | ICD-10-CM | POA: Diagnosis not present

## 2018-05-12 DIAGNOSIS — S82192D Other fracture of upper end of left tibia, subsequent encounter for closed fracture with routine healing: Secondary | ICD-10-CM | POA: Diagnosis not present

## 2018-05-12 DIAGNOSIS — S8251XD Displaced fracture of medial malleolus of right tibia, subsequent encounter for closed fracture with routine healing: Secondary | ICD-10-CM | POA: Diagnosis not present

## 2018-05-13 DIAGNOSIS — S8251XD Displaced fracture of medial malleolus of right tibia, subsequent encounter for closed fracture with routine healing: Secondary | ICD-10-CM | POA: Diagnosis not present

## 2018-05-13 DIAGNOSIS — N183 Chronic kidney disease, stage 3 (moderate): Secondary | ICD-10-CM | POA: Diagnosis not present

## 2018-05-13 DIAGNOSIS — I482 Chronic atrial fibrillation: Secondary | ICD-10-CM | POA: Diagnosis not present

## 2018-05-13 DIAGNOSIS — I5032 Chronic diastolic (congestive) heart failure: Secondary | ICD-10-CM | POA: Diagnosis not present

## 2018-05-13 DIAGNOSIS — M810 Age-related osteoporosis without current pathological fracture: Secondary | ICD-10-CM | POA: Diagnosis not present

## 2018-05-13 DIAGNOSIS — I513 Intracardiac thrombosis, not elsewhere classified: Secondary | ICD-10-CM | POA: Diagnosis not present

## 2018-05-13 DIAGNOSIS — S82832D Other fracture of upper and lower end of left fibula, subsequent encounter for closed fracture with routine healing: Secondary | ICD-10-CM | POA: Diagnosis not present

## 2018-05-13 DIAGNOSIS — S72402D Unspecified fracture of lower end of left femur, subsequent encounter for closed fracture with routine healing: Secondary | ICD-10-CM | POA: Diagnosis not present

## 2018-05-13 DIAGNOSIS — S82192D Other fracture of upper end of left tibia, subsequent encounter for closed fracture with routine healing: Secondary | ICD-10-CM | POA: Diagnosis not present

## 2018-05-13 DIAGNOSIS — I130 Hypertensive heart and chronic kidney disease with heart failure and stage 1 through stage 4 chronic kidney disease, or unspecified chronic kidney disease: Secondary | ICD-10-CM | POA: Diagnosis not present

## 2018-05-13 DIAGNOSIS — F329 Major depressive disorder, single episode, unspecified: Secondary | ICD-10-CM | POA: Diagnosis not present

## 2018-05-13 DIAGNOSIS — E1122 Type 2 diabetes mellitus with diabetic chronic kidney disease: Secondary | ICD-10-CM | POA: Diagnosis not present

## 2018-05-14 DIAGNOSIS — N183 Chronic kidney disease, stage 3 (moderate): Secondary | ICD-10-CM | POA: Diagnosis not present

## 2018-05-14 DIAGNOSIS — I513 Intracardiac thrombosis, not elsewhere classified: Secondary | ICD-10-CM | POA: Diagnosis not present

## 2018-05-14 DIAGNOSIS — S72402D Unspecified fracture of lower end of left femur, subsequent encounter for closed fracture with routine healing: Secondary | ICD-10-CM | POA: Diagnosis not present

## 2018-05-14 DIAGNOSIS — S82192D Other fracture of upper end of left tibia, subsequent encounter for closed fracture with routine healing: Secondary | ICD-10-CM | POA: Diagnosis not present

## 2018-05-14 DIAGNOSIS — S82832D Other fracture of upper and lower end of left fibula, subsequent encounter for closed fracture with routine healing: Secondary | ICD-10-CM | POA: Diagnosis not present

## 2018-05-14 DIAGNOSIS — I5032 Chronic diastolic (congestive) heart failure: Secondary | ICD-10-CM | POA: Diagnosis not present

## 2018-05-14 DIAGNOSIS — F329 Major depressive disorder, single episode, unspecified: Secondary | ICD-10-CM | POA: Diagnosis not present

## 2018-05-14 DIAGNOSIS — I130 Hypertensive heart and chronic kidney disease with heart failure and stage 1 through stage 4 chronic kidney disease, or unspecified chronic kidney disease: Secondary | ICD-10-CM | POA: Diagnosis not present

## 2018-05-14 DIAGNOSIS — M810 Age-related osteoporosis without current pathological fracture: Secondary | ICD-10-CM | POA: Diagnosis not present

## 2018-05-14 DIAGNOSIS — S8251XD Displaced fracture of medial malleolus of right tibia, subsequent encounter for closed fracture with routine healing: Secondary | ICD-10-CM | POA: Diagnosis not present

## 2018-05-14 DIAGNOSIS — E1122 Type 2 diabetes mellitus with diabetic chronic kidney disease: Secondary | ICD-10-CM | POA: Diagnosis not present

## 2018-05-14 DIAGNOSIS — I482 Chronic atrial fibrillation: Secondary | ICD-10-CM | POA: Diagnosis not present

## 2018-05-17 DIAGNOSIS — S8251XD Displaced fracture of medial malleolus of right tibia, subsequent encounter for closed fracture with routine healing: Secondary | ICD-10-CM | POA: Diagnosis not present

## 2018-05-17 DIAGNOSIS — N3090 Cystitis, unspecified without hematuria: Secondary | ICD-10-CM | POA: Diagnosis not present

## 2018-05-17 DIAGNOSIS — I482 Chronic atrial fibrillation: Secondary | ICD-10-CM | POA: Diagnosis not present

## 2018-05-17 DIAGNOSIS — M810 Age-related osteoporosis without current pathological fracture: Secondary | ICD-10-CM | POA: Diagnosis not present

## 2018-05-17 DIAGNOSIS — I513 Intracardiac thrombosis, not elsewhere classified: Secondary | ICD-10-CM | POA: Diagnosis not present

## 2018-05-17 DIAGNOSIS — I5032 Chronic diastolic (congestive) heart failure: Secondary | ICD-10-CM | POA: Diagnosis not present

## 2018-05-17 DIAGNOSIS — N183 Chronic kidney disease, stage 3 (moderate): Secondary | ICD-10-CM | POA: Diagnosis not present

## 2018-05-17 DIAGNOSIS — E1122 Type 2 diabetes mellitus with diabetic chronic kidney disease: Secondary | ICD-10-CM | POA: Diagnosis not present

## 2018-05-17 DIAGNOSIS — S82832D Other fracture of upper and lower end of left fibula, subsequent encounter for closed fracture with routine healing: Secondary | ICD-10-CM | POA: Diagnosis not present

## 2018-05-17 DIAGNOSIS — I130 Hypertensive heart and chronic kidney disease with heart failure and stage 1 through stage 4 chronic kidney disease, or unspecified chronic kidney disease: Secondary | ICD-10-CM | POA: Diagnosis not present

## 2018-05-17 DIAGNOSIS — F329 Major depressive disorder, single episode, unspecified: Secondary | ICD-10-CM | POA: Diagnosis not present

## 2018-05-17 DIAGNOSIS — S72402D Unspecified fracture of lower end of left femur, subsequent encounter for closed fracture with routine healing: Secondary | ICD-10-CM | POA: Diagnosis not present

## 2018-05-17 DIAGNOSIS — S82192D Other fracture of upper end of left tibia, subsequent encounter for closed fracture with routine healing: Secondary | ICD-10-CM | POA: Diagnosis not present

## 2018-05-18 DIAGNOSIS — S72402D Unspecified fracture of lower end of left femur, subsequent encounter for closed fracture with routine healing: Secondary | ICD-10-CM | POA: Diagnosis not present

## 2018-05-18 DIAGNOSIS — S8251XD Displaced fracture of medial malleolus of right tibia, subsequent encounter for closed fracture with routine healing: Secondary | ICD-10-CM | POA: Diagnosis not present

## 2018-05-18 DIAGNOSIS — M810 Age-related osteoporosis without current pathological fracture: Secondary | ICD-10-CM | POA: Diagnosis not present

## 2018-05-18 DIAGNOSIS — I5032 Chronic diastolic (congestive) heart failure: Secondary | ICD-10-CM | POA: Diagnosis not present

## 2018-05-18 DIAGNOSIS — I482 Chronic atrial fibrillation, unspecified: Secondary | ICD-10-CM | POA: Diagnosis not present

## 2018-05-18 DIAGNOSIS — I513 Intracardiac thrombosis, not elsewhere classified: Secondary | ICD-10-CM | POA: Diagnosis not present

## 2018-05-18 DIAGNOSIS — I130 Hypertensive heart and chronic kidney disease with heart failure and stage 1 through stage 4 chronic kidney disease, or unspecified chronic kidney disease: Secondary | ICD-10-CM | POA: Diagnosis not present

## 2018-05-18 DIAGNOSIS — F329 Major depressive disorder, single episode, unspecified: Secondary | ICD-10-CM | POA: Diagnosis not present

## 2018-05-18 DIAGNOSIS — S82192D Other fracture of upper end of left tibia, subsequent encounter for closed fracture with routine healing: Secondary | ICD-10-CM | POA: Diagnosis not present

## 2018-05-18 DIAGNOSIS — N183 Chronic kidney disease, stage 3 (moderate): Secondary | ICD-10-CM | POA: Diagnosis not present

## 2018-05-18 DIAGNOSIS — E1122 Type 2 diabetes mellitus with diabetic chronic kidney disease: Secondary | ICD-10-CM | POA: Diagnosis not present

## 2018-05-18 DIAGNOSIS — S82832D Other fracture of upper and lower end of left fibula, subsequent encounter for closed fracture with routine healing: Secondary | ICD-10-CM | POA: Diagnosis not present

## 2018-05-19 DIAGNOSIS — S72402D Unspecified fracture of lower end of left femur, subsequent encounter for closed fracture with routine healing: Secondary | ICD-10-CM | POA: Diagnosis not present

## 2018-05-19 DIAGNOSIS — E1122 Type 2 diabetes mellitus with diabetic chronic kidney disease: Secondary | ICD-10-CM | POA: Diagnosis not present

## 2018-05-19 DIAGNOSIS — S8251XD Displaced fracture of medial malleolus of right tibia, subsequent encounter for closed fracture with routine healing: Secondary | ICD-10-CM | POA: Diagnosis not present

## 2018-05-19 DIAGNOSIS — S82192D Other fracture of upper end of left tibia, subsequent encounter for closed fracture with routine healing: Secondary | ICD-10-CM | POA: Diagnosis not present

## 2018-05-19 DIAGNOSIS — S82832D Other fracture of upper and lower end of left fibula, subsequent encounter for closed fracture with routine healing: Secondary | ICD-10-CM | POA: Diagnosis not present

## 2018-05-19 DIAGNOSIS — I513 Intracardiac thrombosis, not elsewhere classified: Secondary | ICD-10-CM | POA: Diagnosis not present

## 2018-05-19 DIAGNOSIS — I482 Chronic atrial fibrillation, unspecified: Secondary | ICD-10-CM | POA: Diagnosis not present

## 2018-05-19 DIAGNOSIS — M810 Age-related osteoporosis without current pathological fracture: Secondary | ICD-10-CM | POA: Diagnosis not present

## 2018-05-19 DIAGNOSIS — N183 Chronic kidney disease, stage 3 (moderate): Secondary | ICD-10-CM | POA: Diagnosis not present

## 2018-05-19 DIAGNOSIS — F329 Major depressive disorder, single episode, unspecified: Secondary | ICD-10-CM | POA: Diagnosis not present

## 2018-05-19 DIAGNOSIS — I130 Hypertensive heart and chronic kidney disease with heart failure and stage 1 through stage 4 chronic kidney disease, or unspecified chronic kidney disease: Secondary | ICD-10-CM | POA: Diagnosis not present

## 2018-05-19 DIAGNOSIS — I5032 Chronic diastolic (congestive) heart failure: Secondary | ICD-10-CM | POA: Diagnosis not present

## 2018-05-20 DIAGNOSIS — S82832D Other fracture of upper and lower end of left fibula, subsequent encounter for closed fracture with routine healing: Secondary | ICD-10-CM | POA: Diagnosis not present

## 2018-05-20 DIAGNOSIS — N183 Chronic kidney disease, stage 3 (moderate): Secondary | ICD-10-CM | POA: Diagnosis not present

## 2018-05-20 DIAGNOSIS — I5032 Chronic diastolic (congestive) heart failure: Secondary | ICD-10-CM | POA: Diagnosis not present

## 2018-05-20 DIAGNOSIS — S8251XD Displaced fracture of medial malleolus of right tibia, subsequent encounter for closed fracture with routine healing: Secondary | ICD-10-CM | POA: Diagnosis not present

## 2018-05-20 DIAGNOSIS — E1122 Type 2 diabetes mellitus with diabetic chronic kidney disease: Secondary | ICD-10-CM | POA: Diagnosis not present

## 2018-05-20 DIAGNOSIS — M810 Age-related osteoporosis without current pathological fracture: Secondary | ICD-10-CM | POA: Diagnosis not present

## 2018-05-20 DIAGNOSIS — S72402D Unspecified fracture of lower end of left femur, subsequent encounter for closed fracture with routine healing: Secondary | ICD-10-CM | POA: Diagnosis not present

## 2018-05-20 DIAGNOSIS — I513 Intracardiac thrombosis, not elsewhere classified: Secondary | ICD-10-CM | POA: Diagnosis not present

## 2018-05-20 DIAGNOSIS — I482 Chronic atrial fibrillation, unspecified: Secondary | ICD-10-CM | POA: Diagnosis not present

## 2018-05-20 DIAGNOSIS — S82192D Other fracture of upper end of left tibia, subsequent encounter for closed fracture with routine healing: Secondary | ICD-10-CM | POA: Diagnosis not present

## 2018-05-20 DIAGNOSIS — I130 Hypertensive heart and chronic kidney disease with heart failure and stage 1 through stage 4 chronic kidney disease, or unspecified chronic kidney disease: Secondary | ICD-10-CM | POA: Diagnosis not present

## 2018-05-20 DIAGNOSIS — F329 Major depressive disorder, single episode, unspecified: Secondary | ICD-10-CM | POA: Diagnosis not present

## 2018-05-24 DIAGNOSIS — S72402D Unspecified fracture of lower end of left femur, subsequent encounter for closed fracture with routine healing: Secondary | ICD-10-CM | POA: Diagnosis not present

## 2018-05-24 DIAGNOSIS — S82192D Other fracture of upper end of left tibia, subsequent encounter for closed fracture with routine healing: Secondary | ICD-10-CM | POA: Diagnosis not present

## 2018-05-24 DIAGNOSIS — I482 Chronic atrial fibrillation, unspecified: Secondary | ICD-10-CM | POA: Diagnosis not present

## 2018-05-24 DIAGNOSIS — I513 Intracardiac thrombosis, not elsewhere classified: Secondary | ICD-10-CM | POA: Diagnosis not present

## 2018-05-24 DIAGNOSIS — I130 Hypertensive heart and chronic kidney disease with heart failure and stage 1 through stage 4 chronic kidney disease, or unspecified chronic kidney disease: Secondary | ICD-10-CM | POA: Diagnosis not present

## 2018-05-24 DIAGNOSIS — F329 Major depressive disorder, single episode, unspecified: Secondary | ICD-10-CM | POA: Diagnosis not present

## 2018-05-24 DIAGNOSIS — I5032 Chronic diastolic (congestive) heart failure: Secondary | ICD-10-CM | POA: Diagnosis not present

## 2018-05-24 DIAGNOSIS — S82832D Other fracture of upper and lower end of left fibula, subsequent encounter for closed fracture with routine healing: Secondary | ICD-10-CM | POA: Diagnosis not present

## 2018-05-24 DIAGNOSIS — E1122 Type 2 diabetes mellitus with diabetic chronic kidney disease: Secondary | ICD-10-CM | POA: Diagnosis not present

## 2018-05-24 DIAGNOSIS — N183 Chronic kidney disease, stage 3 (moderate): Secondary | ICD-10-CM | POA: Diagnosis not present

## 2018-05-24 DIAGNOSIS — S8251XD Displaced fracture of medial malleolus of right tibia, subsequent encounter for closed fracture with routine healing: Secondary | ICD-10-CM | POA: Diagnosis not present

## 2018-05-24 DIAGNOSIS — M810 Age-related osteoporosis without current pathological fracture: Secondary | ICD-10-CM | POA: Diagnosis not present

## 2018-05-25 DIAGNOSIS — I482 Chronic atrial fibrillation, unspecified: Secondary | ICD-10-CM | POA: Diagnosis not present

## 2018-05-25 DIAGNOSIS — I513 Intracardiac thrombosis, not elsewhere classified: Secondary | ICD-10-CM | POA: Diagnosis not present

## 2018-05-25 DIAGNOSIS — N183 Chronic kidney disease, stage 3 (moderate): Secondary | ICD-10-CM | POA: Diagnosis not present

## 2018-05-25 DIAGNOSIS — I130 Hypertensive heart and chronic kidney disease with heart failure and stage 1 through stage 4 chronic kidney disease, or unspecified chronic kidney disease: Secondary | ICD-10-CM | POA: Diagnosis not present

## 2018-05-25 DIAGNOSIS — S72402D Unspecified fracture of lower end of left femur, subsequent encounter for closed fracture with routine healing: Secondary | ICD-10-CM | POA: Diagnosis not present

## 2018-05-25 DIAGNOSIS — I5032 Chronic diastolic (congestive) heart failure: Secondary | ICD-10-CM | POA: Diagnosis not present

## 2018-05-25 DIAGNOSIS — N3090 Cystitis, unspecified without hematuria: Secondary | ICD-10-CM | POA: Diagnosis not present

## 2018-05-25 DIAGNOSIS — S8251XD Displaced fracture of medial malleolus of right tibia, subsequent encounter for closed fracture with routine healing: Secondary | ICD-10-CM | POA: Diagnosis not present

## 2018-05-25 DIAGNOSIS — S82192D Other fracture of upper end of left tibia, subsequent encounter for closed fracture with routine healing: Secondary | ICD-10-CM | POA: Diagnosis not present

## 2018-05-25 DIAGNOSIS — F329 Major depressive disorder, single episode, unspecified: Secondary | ICD-10-CM | POA: Diagnosis not present

## 2018-05-25 DIAGNOSIS — M810 Age-related osteoporosis without current pathological fracture: Secondary | ICD-10-CM | POA: Diagnosis not present

## 2018-05-25 DIAGNOSIS — E1122 Type 2 diabetes mellitus with diabetic chronic kidney disease: Secondary | ICD-10-CM | POA: Diagnosis not present

## 2018-05-25 DIAGNOSIS — S82832D Other fracture of upper and lower end of left fibula, subsequent encounter for closed fracture with routine healing: Secondary | ICD-10-CM | POA: Diagnosis not present

## 2018-06-07 ENCOUNTER — Ambulatory Visit (INDEPENDENT_AMBULATORY_CARE_PROVIDER_SITE_OTHER): Payer: BLUE CROSS/BLUE SHIELD

## 2018-06-07 ENCOUNTER — Other Ambulatory Visit: Payer: Self-pay | Admitting: Orthopedic Surgery

## 2018-06-07 ENCOUNTER — Ambulatory Visit: Payer: BLUE CROSS/BLUE SHIELD | Admitting: Orthopedic Surgery

## 2018-06-07 DIAGNOSIS — S82891A Other fracture of right lower leg, initial encounter for closed fracture: Secondary | ICD-10-CM

## 2018-06-07 DIAGNOSIS — S82402D Unspecified fracture of shaft of left fibula, subsequent encounter for closed fracture with routine healing: Secondary | ICD-10-CM

## 2018-06-07 DIAGNOSIS — S82201A Unspecified fracture of shaft of right tibia, initial encounter for closed fracture: Secondary | ICD-10-CM | POA: Diagnosis not present

## 2018-06-07 DIAGNOSIS — S82401A Unspecified fracture of shaft of right fibula, initial encounter for closed fracture: Secondary | ICD-10-CM

## 2018-06-07 DIAGNOSIS — S82891D Other fracture of right lower leg, subsequent encounter for closed fracture with routine healing: Secondary | ICD-10-CM

## 2018-06-07 DIAGNOSIS — S82202D Unspecified fracture of shaft of left tibia, subsequent encounter for closed fracture with routine healing: Secondary | ICD-10-CM

## 2018-06-07 NOTE — Patient Instructions (Signed)
Weightbearing as tolerated on the right with the cam walker on and toe-touch weightbearing on the left with the immobilizer on

## 2018-06-07 NOTE — Telephone Encounter (Signed)
Patient called stating she was talking with Dr. Aline Brochure this morning about pain medication. She is under the impression that he was going to write her a prescription for some and step it down from what she was taking.She has checked and there is not any prescriptions for her at her pharmacy.  Please advise  PATIENT USES [facility]

## 2018-06-07 NOTE — Progress Notes (Signed)
Chief Complaint  Patient presents with  . Ankle Injury    , DOI 04-13-18.  . Leg Pain    Left tibia fibula fracture   Follow-up after hospital consult history noted below  Complains of discomfort proximal tibia left leg ankle right leg but much less than at the time of injury:  [name]  is a 67 y.o. female, with history of chronic diastolic CHF, atrial fibrillation on anticoagulation with Eliquis, left atrial thrombus, type 2 diabetes mellitus, chronic kidney disease stage III hypertension, anemia who was just discharged from the hospital on 04/13/2018.  Patient at home was trying to transfer herself from wheelchair to left ear and she missed the left.  And injured left lower leg and right ankle.  Patient was brought to hospital.  She denies passing out.  No dizziness or blurred vision.  No chest pain or shortness of breath.  No nausea vomiting or diarrhea.  No abdominal pain.  No dysuria, urgency or frequency of urination. In the ED, x-ray of the left lower extremity showed minimally displaced proximal left tibial and fibular fractures.  X-ray of right ankle showed distal fibular metaphyseal and medial malleolar fractures. Orthopedic surgery was consulted by ED.  Dr. Aline Brochure to see patient in a.m.   Right ankle bimalleolar fracture shows stability on x-ray no displacement callus forming on both lateral malleolus and medial malleolus  She will be placed in a weightbearing as tolerated CAM Walker  The left tibia more tender than the right ankle alignment is normal  Neurovascular exam is intact  Continue immobilizer  She can resume therapy weightbearing as tolerated on the right toe-touch weightbearing on the left  Follow-up 6 weeks repeat x-rays both areas  Encounter Diagnoses  Name Primary?  . Closed fracture of right ankle with routine healing, subsequent encounter Yes  . Closed fracture of right tibia and fibula with routine healing, subsequent encounter

## 2018-06-08 ENCOUNTER — Other Ambulatory Visit: Payer: Self-pay | Admitting: Orthopedic Surgery

## 2018-06-08 ENCOUNTER — Ambulatory Visit: Payer: Self-pay | Admitting: Cardiovascular Disease

## 2018-06-08 DIAGNOSIS — S82891D Other fracture of right lower leg, subsequent encounter for closed fracture with routine healing: Secondary | ICD-10-CM

## 2018-06-08 MED ORDER — HYDROCODONE-ACETAMINOPHEN 7.5-325 MG PO TABS: 1.0000 | ORAL_TABLET | Freq: Four times a day (QID) | ORAL | 0 refills | Status: DC | PRN

## 2018-06-08 NOTE — Telephone Encounter (Signed)
Prescription for  HYDROcodone-acetaminophen (NORCO) 7.5-325 MG tablet 28 tablet   was not received electronically at [facility], Linna Hoff - patient has checked and I have also spoken with pharmacist Richardson Landry - said it did not come through.

## 2018-06-09 ENCOUNTER — Other Ambulatory Visit: Payer: Self-pay | Admitting: Orthopedic Surgery

## 2018-06-09 ENCOUNTER — Encounter: Payer: Self-pay | Admitting: Orthopedic Surgery

## 2018-06-09 DIAGNOSIS — S72402D Unspecified fracture of lower end of left femur, subsequent encounter for closed fracture with routine healing: Secondary | ICD-10-CM | POA: Diagnosis not present

## 2018-06-09 DIAGNOSIS — I130 Hypertensive heart and chronic kidney disease with heart failure and stage 1 through stage 4 chronic kidney disease, or unspecified chronic kidney disease: Secondary | ICD-10-CM | POA: Diagnosis not present

## 2018-06-09 DIAGNOSIS — S82832D Other fracture of upper and lower end of left fibula, subsequent encounter for closed fracture with routine healing: Secondary | ICD-10-CM | POA: Diagnosis not present

## 2018-06-09 DIAGNOSIS — E1122 Type 2 diabetes mellitus with diabetic chronic kidney disease: Secondary | ICD-10-CM | POA: Diagnosis not present

## 2018-06-09 DIAGNOSIS — I482 Chronic atrial fibrillation, unspecified: Secondary | ICD-10-CM | POA: Diagnosis not present

## 2018-06-09 DIAGNOSIS — I5032 Chronic diastolic (congestive) heart failure: Secondary | ICD-10-CM | POA: Diagnosis not present

## 2018-06-09 DIAGNOSIS — S82891D Other fracture of right lower leg, subsequent encounter for closed fracture with routine healing: Secondary | ICD-10-CM

## 2018-06-09 DIAGNOSIS — S82192D Other fracture of upper end of left tibia, subsequent encounter for closed fracture with routine healing: Secondary | ICD-10-CM | POA: Diagnosis not present

## 2018-06-09 DIAGNOSIS — M810 Age-related osteoporosis without current pathological fracture: Secondary | ICD-10-CM | POA: Diagnosis not present

## 2018-06-09 DIAGNOSIS — S7292XA Unspecified fracture of left femur, initial encounter for closed fracture: Secondary | ICD-10-CM | POA: Diagnosis not present

## 2018-06-09 DIAGNOSIS — N183 Chronic kidney disease, stage 3 (moderate): Secondary | ICD-10-CM | POA: Diagnosis not present

## 2018-06-09 DIAGNOSIS — I513 Intracardiac thrombosis, not elsewhere classified: Secondary | ICD-10-CM | POA: Diagnosis not present

## 2018-06-09 DIAGNOSIS — F329 Major depressive disorder, single episode, unspecified: Secondary | ICD-10-CM | POA: Diagnosis not present

## 2018-06-09 DIAGNOSIS — S8251XD Displaced fracture of medial malleolus of right tibia, subsequent encounter for closed fracture with routine healing: Secondary | ICD-10-CM | POA: Diagnosis not present

## 2018-06-09 MED ORDER — HYDROCODONE-ACETAMINOPHEN 7.5-325 MG PO TABS: 1.0000 | ORAL_TABLET | Freq: Four times a day (QID) | ORAL | 0 refills | Status: DC | PRN

## 2018-06-10 DIAGNOSIS — S82202D Unspecified fracture of shaft of left tibia, subsequent encounter for closed fracture with routine healing: Secondary | ICD-10-CM | POA: Insufficient documentation

## 2018-06-10 DIAGNOSIS — S82402D Unspecified fracture of shaft of left fibula, subsequent encounter for closed fracture with routine healing: Secondary | ICD-10-CM

## 2018-06-10 DIAGNOSIS — S82891A Other fracture of right lower leg, initial encounter for closed fracture: Secondary | ICD-10-CM | POA: Insufficient documentation

## 2018-06-14 DIAGNOSIS — E1122 Type 2 diabetes mellitus with diabetic chronic kidney disease: Secondary | ICD-10-CM | POA: Diagnosis not present

## 2018-06-14 DIAGNOSIS — I5032 Chronic diastolic (congestive) heart failure: Secondary | ICD-10-CM | POA: Diagnosis not present

## 2018-06-14 DIAGNOSIS — I513 Intracardiac thrombosis, not elsewhere classified: Secondary | ICD-10-CM | POA: Diagnosis not present

## 2018-06-14 DIAGNOSIS — S8251XD Displaced fracture of medial malleolus of right tibia, subsequent encounter for closed fracture with routine healing: Secondary | ICD-10-CM | POA: Diagnosis not present

## 2018-06-14 DIAGNOSIS — M810 Age-related osteoporosis without current pathological fracture: Secondary | ICD-10-CM | POA: Diagnosis not present

## 2018-06-14 DIAGNOSIS — I482 Chronic atrial fibrillation, unspecified: Secondary | ICD-10-CM | POA: Diagnosis not present

## 2018-06-14 DIAGNOSIS — S72402D Unspecified fracture of lower end of left femur, subsequent encounter for closed fracture with routine healing: Secondary | ICD-10-CM | POA: Diagnosis not present

## 2018-06-14 DIAGNOSIS — F329 Major depressive disorder, single episode, unspecified: Secondary | ICD-10-CM | POA: Diagnosis not present

## 2018-06-14 DIAGNOSIS — N183 Chronic kidney disease, stage 3 (moderate): Secondary | ICD-10-CM | POA: Diagnosis not present

## 2018-06-14 DIAGNOSIS — I130 Hypertensive heart and chronic kidney disease with heart failure and stage 1 through stage 4 chronic kidney disease, or unspecified chronic kidney disease: Secondary | ICD-10-CM | POA: Diagnosis not present

## 2018-06-14 DIAGNOSIS — S82832D Other fracture of upper and lower end of left fibula, subsequent encounter for closed fracture with routine healing: Secondary | ICD-10-CM | POA: Diagnosis not present

## 2018-06-14 DIAGNOSIS — S82192D Other fracture of upper end of left tibia, subsequent encounter for closed fracture with routine healing: Secondary | ICD-10-CM | POA: Diagnosis not present

## 2018-06-15 ENCOUNTER — Telehealth: Payer: Self-pay | Admitting: Orthopaedic Surgery

## 2018-06-15 NOTE — Telephone Encounter (Signed)
Called to advise 3x weekly for 2 weeks and 2 times weekly for 2 weeks is the plan.

## 2018-06-15 NOTE — Telephone Encounter (Signed)
Call received from [facility], Karn Cassis, ph# [phone], requesting verbal orders. May leave detailed message.

## 2018-06-17 DIAGNOSIS — S8251XD Displaced fracture of medial malleolus of right tibia, subsequent encounter for closed fracture with routine healing: Secondary | ICD-10-CM | POA: Diagnosis not present

## 2018-06-17 DIAGNOSIS — I5032 Chronic diastolic (congestive) heart failure: Secondary | ICD-10-CM | POA: Diagnosis not present

## 2018-06-17 DIAGNOSIS — S82192D Other fracture of upper end of left tibia, subsequent encounter for closed fracture with routine healing: Secondary | ICD-10-CM | POA: Diagnosis not present

## 2018-06-17 DIAGNOSIS — S82832D Other fracture of upper and lower end of left fibula, subsequent encounter for closed fracture with routine healing: Secondary | ICD-10-CM | POA: Diagnosis not present

## 2018-06-17 DIAGNOSIS — M810 Age-related osteoporosis without current pathological fracture: Secondary | ICD-10-CM | POA: Diagnosis not present

## 2018-06-17 DIAGNOSIS — I513 Intracardiac thrombosis, not elsewhere classified: Secondary | ICD-10-CM | POA: Diagnosis not present

## 2018-06-17 DIAGNOSIS — F329 Major depressive disorder, single episode, unspecified: Secondary | ICD-10-CM | POA: Diagnosis not present

## 2018-06-17 DIAGNOSIS — S72402D Unspecified fracture of lower end of left femur, subsequent encounter for closed fracture with routine healing: Secondary | ICD-10-CM | POA: Diagnosis not present

## 2018-06-17 DIAGNOSIS — N183 Chronic kidney disease, stage 3 (moderate): Secondary | ICD-10-CM | POA: Diagnosis not present

## 2018-06-17 DIAGNOSIS — I130 Hypertensive heart and chronic kidney disease with heart failure and stage 1 through stage 4 chronic kidney disease, or unspecified chronic kidney disease: Secondary | ICD-10-CM | POA: Diagnosis not present

## 2018-06-17 DIAGNOSIS — E1122 Type 2 diabetes mellitus with diabetic chronic kidney disease: Secondary | ICD-10-CM | POA: Diagnosis not present

## 2018-06-17 DIAGNOSIS — I482 Chronic atrial fibrillation, unspecified: Secondary | ICD-10-CM | POA: Diagnosis not present

## 2018-06-21 ENCOUNTER — Other Ambulatory Visit: Payer: Self-pay | Admitting: Orthopedic Surgery

## 2018-06-21 DIAGNOSIS — I5032 Chronic diastolic (congestive) heart failure: Secondary | ICD-10-CM | POA: Diagnosis not present

## 2018-06-21 DIAGNOSIS — I482 Chronic atrial fibrillation, unspecified: Secondary | ICD-10-CM | POA: Diagnosis not present

## 2018-06-21 DIAGNOSIS — S82832D Other fracture of upper and lower end of left fibula, subsequent encounter for closed fracture with routine healing: Secondary | ICD-10-CM | POA: Diagnosis not present

## 2018-06-21 DIAGNOSIS — S72402D Unspecified fracture of lower end of left femur, subsequent encounter for closed fracture with routine healing: Secondary | ICD-10-CM | POA: Diagnosis not present

## 2018-06-21 DIAGNOSIS — S82891D Other fracture of right lower leg, subsequent encounter for closed fracture with routine healing: Secondary | ICD-10-CM

## 2018-06-21 DIAGNOSIS — M810 Age-related osteoporosis without current pathological fracture: Secondary | ICD-10-CM | POA: Diagnosis not present

## 2018-06-21 DIAGNOSIS — I513 Intracardiac thrombosis, not elsewhere classified: Secondary | ICD-10-CM | POA: Diagnosis not present

## 2018-06-21 DIAGNOSIS — I130 Hypertensive heart and chronic kidney disease with heart failure and stage 1 through stage 4 chronic kidney disease, or unspecified chronic kidney disease: Secondary | ICD-10-CM | POA: Diagnosis not present

## 2018-06-21 DIAGNOSIS — N183 Chronic kidney disease, stage 3 (moderate): Secondary | ICD-10-CM | POA: Diagnosis not present

## 2018-06-21 DIAGNOSIS — S82192D Other fracture of upper end of left tibia, subsequent encounter for closed fracture with routine healing: Secondary | ICD-10-CM | POA: Diagnosis not present

## 2018-06-21 DIAGNOSIS — E1122 Type 2 diabetes mellitus with diabetic chronic kidney disease: Secondary | ICD-10-CM | POA: Diagnosis not present

## 2018-06-21 DIAGNOSIS — F329 Major depressive disorder, single episode, unspecified: Secondary | ICD-10-CM | POA: Diagnosis not present

## 2018-06-21 DIAGNOSIS — S8251XD Displaced fracture of medial malleolus of right tibia, subsequent encounter for closed fracture with routine healing: Secondary | ICD-10-CM | POA: Diagnosis not present

## 2018-06-21 MED ORDER — HYDROCODONE-ACETAMINOPHEN 7.5-325 MG PO TABS: 1.0000 | ORAL_TABLET | Freq: Four times a day (QID) | ORAL | 0 refills | Status: AC | PRN

## 2018-06-21 NOTE — Telephone Encounter (Signed)
Oxycodone-Acetaminophen  5/325 mg    PATIENT USES [facility]

## 2018-06-21 NOTE — Telephone Encounter (Signed)
Oxycodone-Acetaminophen  5/325 mg  PATIENT USES [facility]  I called her, Dr Aline Brochure prescribed Hydrocodone. She has told me she wants refill of Hydrocodone 7.5 #28

## 2018-06-23 DIAGNOSIS — I513 Intracardiac thrombosis, not elsewhere classified: Secondary | ICD-10-CM | POA: Diagnosis not present

## 2018-06-23 DIAGNOSIS — I130 Hypertensive heart and chronic kidney disease with heart failure and stage 1 through stage 4 chronic kidney disease, or unspecified chronic kidney disease: Secondary | ICD-10-CM | POA: Diagnosis not present

## 2018-06-23 DIAGNOSIS — S82192D Other fracture of upper end of left tibia, subsequent encounter for closed fracture with routine healing: Secondary | ICD-10-CM | POA: Diagnosis not present

## 2018-06-23 DIAGNOSIS — E1122 Type 2 diabetes mellitus with diabetic chronic kidney disease: Secondary | ICD-10-CM | POA: Diagnosis not present

## 2018-06-23 DIAGNOSIS — I482 Chronic atrial fibrillation, unspecified: Secondary | ICD-10-CM | POA: Diagnosis not present

## 2018-06-23 DIAGNOSIS — I5032 Chronic diastolic (congestive) heart failure: Secondary | ICD-10-CM | POA: Diagnosis not present

## 2018-06-23 DIAGNOSIS — N183 Chronic kidney disease, stage 3 (moderate): Secondary | ICD-10-CM | POA: Diagnosis not present

## 2018-06-23 DIAGNOSIS — S8251XD Displaced fracture of medial malleolus of right tibia, subsequent encounter for closed fracture with routine healing: Secondary | ICD-10-CM | POA: Diagnosis not present

## 2018-06-23 DIAGNOSIS — F329 Major depressive disorder, single episode, unspecified: Secondary | ICD-10-CM | POA: Diagnosis not present

## 2018-06-23 DIAGNOSIS — M810 Age-related osteoporosis without current pathological fracture: Secondary | ICD-10-CM | POA: Diagnosis not present

## 2018-06-23 DIAGNOSIS — S72402D Unspecified fracture of lower end of left femur, subsequent encounter for closed fracture with routine healing: Secondary | ICD-10-CM | POA: Diagnosis not present

## 2018-06-23 DIAGNOSIS — S82832D Other fracture of upper and lower end of left fibula, subsequent encounter for closed fracture with routine healing: Secondary | ICD-10-CM | POA: Diagnosis not present

## 2018-06-25 DIAGNOSIS — S82832D Other fracture of upper and lower end of left fibula, subsequent encounter for closed fracture with routine healing: Secondary | ICD-10-CM | POA: Diagnosis not present

## 2018-06-25 DIAGNOSIS — N183 Chronic kidney disease, stage 3 (moderate): Secondary | ICD-10-CM | POA: Diagnosis not present

## 2018-06-25 DIAGNOSIS — S8251XD Displaced fracture of medial malleolus of right tibia, subsequent encounter for closed fracture with routine healing: Secondary | ICD-10-CM | POA: Diagnosis not present

## 2018-06-25 DIAGNOSIS — M810 Age-related osteoporosis without current pathological fracture: Secondary | ICD-10-CM | POA: Diagnosis not present

## 2018-06-25 DIAGNOSIS — I482 Chronic atrial fibrillation, unspecified: Secondary | ICD-10-CM | POA: Diagnosis not present

## 2018-06-25 DIAGNOSIS — S72402D Unspecified fracture of lower end of left femur, subsequent encounter for closed fracture with routine healing: Secondary | ICD-10-CM | POA: Diagnosis not present

## 2018-06-25 DIAGNOSIS — I130 Hypertensive heart and chronic kidney disease with heart failure and stage 1 through stage 4 chronic kidney disease, or unspecified chronic kidney disease: Secondary | ICD-10-CM | POA: Diagnosis not present

## 2018-06-25 DIAGNOSIS — I513 Intracardiac thrombosis, not elsewhere classified: Secondary | ICD-10-CM | POA: Diagnosis not present

## 2018-06-25 DIAGNOSIS — I5032 Chronic diastolic (congestive) heart failure: Secondary | ICD-10-CM | POA: Diagnosis not present

## 2018-06-25 DIAGNOSIS — E1122 Type 2 diabetes mellitus with diabetic chronic kidney disease: Secondary | ICD-10-CM | POA: Diagnosis not present

## 2018-06-25 DIAGNOSIS — F329 Major depressive disorder, single episode, unspecified: Secondary | ICD-10-CM | POA: Diagnosis not present

## 2018-06-25 DIAGNOSIS — S82192D Other fracture of upper end of left tibia, subsequent encounter for closed fracture with routine healing: Secondary | ICD-10-CM | POA: Diagnosis not present

## 2018-06-29 DIAGNOSIS — I5032 Chronic diastolic (congestive) heart failure: Secondary | ICD-10-CM | POA: Diagnosis not present

## 2018-06-29 DIAGNOSIS — S72402D Unspecified fracture of lower end of left femur, subsequent encounter for closed fracture with routine healing: Secondary | ICD-10-CM | POA: Diagnosis not present

## 2018-06-29 DIAGNOSIS — N183 Chronic kidney disease, stage 3 (moderate): Secondary | ICD-10-CM | POA: Diagnosis not present

## 2018-06-29 DIAGNOSIS — S82192D Other fracture of upper end of left tibia, subsequent encounter for closed fracture with routine healing: Secondary | ICD-10-CM | POA: Diagnosis not present

## 2018-06-29 DIAGNOSIS — I482 Chronic atrial fibrillation, unspecified: Secondary | ICD-10-CM | POA: Diagnosis not present

## 2018-06-29 DIAGNOSIS — I130 Hypertensive heart and chronic kidney disease with heart failure and stage 1 through stage 4 chronic kidney disease, or unspecified chronic kidney disease: Secondary | ICD-10-CM | POA: Diagnosis not present

## 2018-06-29 DIAGNOSIS — E1122 Type 2 diabetes mellitus with diabetic chronic kidney disease: Secondary | ICD-10-CM | POA: Diagnosis not present

## 2018-06-29 DIAGNOSIS — F329 Major depressive disorder, single episode, unspecified: Secondary | ICD-10-CM | POA: Diagnosis not present

## 2018-06-29 DIAGNOSIS — S8251XD Displaced fracture of medial malleolus of right tibia, subsequent encounter for closed fracture with routine healing: Secondary | ICD-10-CM | POA: Diagnosis not present

## 2018-06-29 DIAGNOSIS — S82832D Other fracture of upper and lower end of left fibula, subsequent encounter for closed fracture with routine healing: Secondary | ICD-10-CM | POA: Diagnosis not present

## 2018-06-29 DIAGNOSIS — I513 Intracardiac thrombosis, not elsewhere classified: Secondary | ICD-10-CM | POA: Diagnosis not present

## 2018-06-29 DIAGNOSIS — M810 Age-related osteoporosis without current pathological fracture: Secondary | ICD-10-CM | POA: Diagnosis not present

## 2018-07-01 DIAGNOSIS — S8251XD Displaced fracture of medial malleolus of right tibia, subsequent encounter for closed fracture with routine healing: Secondary | ICD-10-CM | POA: Diagnosis not present

## 2018-07-01 DIAGNOSIS — S82192D Other fracture of upper end of left tibia, subsequent encounter for closed fracture with routine healing: Secondary | ICD-10-CM | POA: Diagnosis not present

## 2018-07-01 DIAGNOSIS — I5032 Chronic diastolic (congestive) heart failure: Secondary | ICD-10-CM | POA: Diagnosis not present

## 2018-07-01 DIAGNOSIS — E1122 Type 2 diabetes mellitus with diabetic chronic kidney disease: Secondary | ICD-10-CM | POA: Diagnosis not present

## 2018-07-01 DIAGNOSIS — M810 Age-related osteoporosis without current pathological fracture: Secondary | ICD-10-CM | POA: Diagnosis not present

## 2018-07-01 DIAGNOSIS — I482 Chronic atrial fibrillation, unspecified: Secondary | ICD-10-CM | POA: Diagnosis not present

## 2018-07-01 DIAGNOSIS — S82832D Other fracture of upper and lower end of left fibula, subsequent encounter for closed fracture with routine healing: Secondary | ICD-10-CM | POA: Diagnosis not present

## 2018-07-01 DIAGNOSIS — I513 Intracardiac thrombosis, not elsewhere classified: Secondary | ICD-10-CM | POA: Diagnosis not present

## 2018-07-01 DIAGNOSIS — S72402D Unspecified fracture of lower end of left femur, subsequent encounter for closed fracture with routine healing: Secondary | ICD-10-CM | POA: Diagnosis not present

## 2018-07-01 DIAGNOSIS — N183 Chronic kidney disease, stage 3 (moderate): Secondary | ICD-10-CM | POA: Diagnosis not present

## 2018-07-01 DIAGNOSIS — I130 Hypertensive heart and chronic kidney disease with heart failure and stage 1 through stage 4 chronic kidney disease, or unspecified chronic kidney disease: Secondary | ICD-10-CM | POA: Diagnosis not present

## 2018-07-01 DIAGNOSIS — F329 Major depressive disorder, single episode, unspecified: Secondary | ICD-10-CM | POA: Diagnosis not present

## 2018-07-02 ENCOUNTER — Telehealth: Payer: Self-pay | Admitting: Orthopedic Surgery

## 2018-07-02 NOTE — Telephone Encounter (Signed)
Patient requested refill on Hydrocodone/Acetaminophen 7.5-325  Mgs.  Qty  28  Sig: Take 1 tablet by mouth every 6 (six) hours as needed for up to 7 days for moderate pain.  [name] states she uses [facility] in [place]

## 2018-07-05 ENCOUNTER — Telehealth: Payer: Self-pay | Admitting: Orthopedic Surgery

## 2018-07-05 ENCOUNTER — Other Ambulatory Visit: Payer: Self-pay | Admitting: Orthopedic Surgery

## 2018-07-05 MED ORDER — HYDROCODONE-ACETAMINOPHEN 5-325 MG PO TABS: 1.0000 | ORAL_TABLET | Freq: Four times a day (QID) | ORAL | 0 refills | Status: DC | PRN

## 2018-07-05 NOTE — Progress Notes (Signed)
N

## 2018-07-05 NOTE — Telephone Encounter (Signed)
Call received from physical therapist at [facility], ph# [phone], requesting orders for extension of therapy for 4 more weeks, regarding transfer and lower extremity strength. Please advise.

## 2018-07-05 NOTE — Telephone Encounter (Signed)
Per Dr Aline Brochure ok to extend, called with verbal

## 2018-07-06 DIAGNOSIS — I130 Hypertensive heart and chronic kidney disease with heart failure and stage 1 through stage 4 chronic kidney disease, or unspecified chronic kidney disease: Secondary | ICD-10-CM | POA: Diagnosis not present

## 2018-07-06 DIAGNOSIS — I5032 Chronic diastolic (congestive) heart failure: Secondary | ICD-10-CM | POA: Diagnosis not present

## 2018-07-06 DIAGNOSIS — S8251XD Displaced fracture of medial malleolus of right tibia, subsequent encounter for closed fracture with routine healing: Secondary | ICD-10-CM | POA: Diagnosis not present

## 2018-07-06 DIAGNOSIS — M810 Age-related osteoporosis without current pathological fracture: Secondary | ICD-10-CM | POA: Diagnosis not present

## 2018-07-06 DIAGNOSIS — S82832D Other fracture of upper and lower end of left fibula, subsequent encounter for closed fracture with routine healing: Secondary | ICD-10-CM | POA: Diagnosis not present

## 2018-07-06 DIAGNOSIS — E1122 Type 2 diabetes mellitus with diabetic chronic kidney disease: Secondary | ICD-10-CM | POA: Diagnosis not present

## 2018-07-06 DIAGNOSIS — F329 Major depressive disorder, single episode, unspecified: Secondary | ICD-10-CM | POA: Diagnosis not present

## 2018-07-06 DIAGNOSIS — N183 Chronic kidney disease, stage 3 (moderate): Secondary | ICD-10-CM | POA: Diagnosis not present

## 2018-07-06 DIAGNOSIS — I482 Chronic atrial fibrillation, unspecified: Secondary | ICD-10-CM | POA: Diagnosis not present

## 2018-07-06 DIAGNOSIS — I513 Intracardiac thrombosis, not elsewhere classified: Secondary | ICD-10-CM | POA: Diagnosis not present

## 2018-07-06 DIAGNOSIS — S72402D Unspecified fracture of lower end of left femur, subsequent encounter for closed fracture with routine healing: Secondary | ICD-10-CM | POA: Diagnosis not present

## 2018-07-06 DIAGNOSIS — S82192D Other fracture of upper end of left tibia, subsequent encounter for closed fracture with routine healing: Secondary | ICD-10-CM | POA: Diagnosis not present

## 2018-07-09 DIAGNOSIS — S82832D Other fracture of upper and lower end of left fibula, subsequent encounter for closed fracture with routine healing: Secondary | ICD-10-CM | POA: Diagnosis not present

## 2018-07-09 DIAGNOSIS — I130 Hypertensive heart and chronic kidney disease with heart failure and stage 1 through stage 4 chronic kidney disease, or unspecified chronic kidney disease: Secondary | ICD-10-CM | POA: Diagnosis not present

## 2018-07-09 DIAGNOSIS — I5032 Chronic diastolic (congestive) heart failure: Secondary | ICD-10-CM | POA: Diagnosis not present

## 2018-07-09 DIAGNOSIS — S8251XD Displaced fracture of medial malleolus of right tibia, subsequent encounter for closed fracture with routine healing: Secondary | ICD-10-CM | POA: Diagnosis not present

## 2018-07-09 DIAGNOSIS — I482 Chronic atrial fibrillation, unspecified: Secondary | ICD-10-CM | POA: Diagnosis not present

## 2018-07-09 DIAGNOSIS — S72402D Unspecified fracture of lower end of left femur, subsequent encounter for closed fracture with routine healing: Secondary | ICD-10-CM | POA: Diagnosis not present

## 2018-07-09 DIAGNOSIS — N183 Chronic kidney disease, stage 3 (moderate): Secondary | ICD-10-CM | POA: Diagnosis not present

## 2018-07-09 DIAGNOSIS — E1122 Type 2 diabetes mellitus with diabetic chronic kidney disease: Secondary | ICD-10-CM | POA: Diagnosis not present

## 2018-07-09 DIAGNOSIS — F329 Major depressive disorder, single episode, unspecified: Secondary | ICD-10-CM | POA: Diagnosis not present

## 2018-07-09 DIAGNOSIS — I513 Intracardiac thrombosis, not elsewhere classified: Secondary | ICD-10-CM | POA: Diagnosis not present

## 2018-07-09 DIAGNOSIS — S82192D Other fracture of upper end of left tibia, subsequent encounter for closed fracture with routine healing: Secondary | ICD-10-CM | POA: Diagnosis not present

## 2018-07-09 DIAGNOSIS — M810 Age-related osteoporosis without current pathological fracture: Secondary | ICD-10-CM | POA: Diagnosis not present

## 2018-07-10 DIAGNOSIS — S7292XA Unspecified fracture of left femur, initial encounter for closed fracture: Secondary | ICD-10-CM | POA: Diagnosis not present

## 2018-07-12 ENCOUNTER — Telehealth: Payer: Self-pay | Admitting: Cardiology

## 2018-07-12 DIAGNOSIS — F329 Major depressive disorder, single episode, unspecified: Secondary | ICD-10-CM | POA: Diagnosis not present

## 2018-07-12 DIAGNOSIS — I5032 Chronic diastolic (congestive) heart failure: Secondary | ICD-10-CM | POA: Diagnosis not present

## 2018-07-12 DIAGNOSIS — I482 Chronic atrial fibrillation, unspecified: Secondary | ICD-10-CM | POA: Diagnosis not present

## 2018-07-12 DIAGNOSIS — S82832D Other fracture of upper and lower end of left fibula, subsequent encounter for closed fracture with routine healing: Secondary | ICD-10-CM | POA: Diagnosis not present

## 2018-07-12 DIAGNOSIS — E1122 Type 2 diabetes mellitus with diabetic chronic kidney disease: Secondary | ICD-10-CM | POA: Diagnosis not present

## 2018-07-12 DIAGNOSIS — M810 Age-related osteoporosis without current pathological fracture: Secondary | ICD-10-CM | POA: Diagnosis not present

## 2018-07-12 DIAGNOSIS — I130 Hypertensive heart and chronic kidney disease with heart failure and stage 1 through stage 4 chronic kidney disease, or unspecified chronic kidney disease: Secondary | ICD-10-CM | POA: Diagnosis not present

## 2018-07-12 DIAGNOSIS — N183 Chronic kidney disease, stage 3 (moderate): Secondary | ICD-10-CM | POA: Diagnosis not present

## 2018-07-12 DIAGNOSIS — S82192D Other fracture of upper end of left tibia, subsequent encounter for closed fracture with routine healing: Secondary | ICD-10-CM | POA: Diagnosis not present

## 2018-07-12 DIAGNOSIS — S8251XD Displaced fracture of medial malleolus of right tibia, subsequent encounter for closed fracture with routine healing: Secondary | ICD-10-CM | POA: Diagnosis not present

## 2018-07-12 DIAGNOSIS — I513 Intracardiac thrombosis, not elsewhere classified: Secondary | ICD-10-CM | POA: Diagnosis not present

## 2018-07-12 DIAGNOSIS — S72402D Unspecified fracture of lower end of left femur, subsequent encounter for closed fracture with routine healing: Secondary | ICD-10-CM | POA: Diagnosis not present

## 2018-07-12 NOTE — Telephone Encounter (Signed)
New Message          [name] with [facility] is calling to report a irregular heart rate 120 bp 94/60.  Patient declines the ER.  Patient has not taken her Metoprolol.

## 2018-07-12 NOTE — Telephone Encounter (Signed)
Per Lelan Pons with home health pt is a symptomatic with b/p of 94/60 and heart rate running 120 this reading was before Metoprolol encouraged pt to take beta blocker and continue to monitor Will forward to dr Harrell Gave for review .Adonis Housekeeper

## 2018-07-12 NOTE — Telephone Encounter (Signed)
If she continues to feel poorly, have low blood pressures or has difficult to control heart rates, I would recommend she go to ER for further evaluation. She is due to follow up with Dr. Bronson Ing in [place] (I saw her in the hospital) on 12/4 for further medication adjustments. Thank you.

## 2018-07-12 NOTE — Telephone Encounter (Signed)
Lm for pt to call back ./cy

## 2018-07-13 NOTE — Telephone Encounter (Signed)
Spoke with [name] and updated her on what Dr. Harrell Gave recommended. Verbalized understanding.

## 2018-07-14 ENCOUNTER — Other Ambulatory Visit: Payer: Self-pay | Admitting: Orthopedic Surgery

## 2018-07-14 DIAGNOSIS — I130 Hypertensive heart and chronic kidney disease with heart failure and stage 1 through stage 4 chronic kidney disease, or unspecified chronic kidney disease: Secondary | ICD-10-CM | POA: Diagnosis not present

## 2018-07-14 DIAGNOSIS — S72402D Unspecified fracture of lower end of left femur, subsequent encounter for closed fracture with routine healing: Secondary | ICD-10-CM | POA: Diagnosis not present

## 2018-07-14 DIAGNOSIS — E1122 Type 2 diabetes mellitus with diabetic chronic kidney disease: Secondary | ICD-10-CM | POA: Diagnosis not present

## 2018-07-14 DIAGNOSIS — S82832D Other fracture of upper and lower end of left fibula, subsequent encounter for closed fracture with routine healing: Secondary | ICD-10-CM | POA: Diagnosis not present

## 2018-07-14 DIAGNOSIS — I5032 Chronic diastolic (congestive) heart failure: Secondary | ICD-10-CM | POA: Diagnosis not present

## 2018-07-14 DIAGNOSIS — F329 Major depressive disorder, single episode, unspecified: Secondary | ICD-10-CM | POA: Diagnosis not present

## 2018-07-14 DIAGNOSIS — M810 Age-related osteoporosis without current pathological fracture: Secondary | ICD-10-CM | POA: Diagnosis not present

## 2018-07-14 DIAGNOSIS — I482 Chronic atrial fibrillation, unspecified: Secondary | ICD-10-CM | POA: Diagnosis not present

## 2018-07-14 DIAGNOSIS — S82192D Other fracture of upper end of left tibia, subsequent encounter for closed fracture with routine healing: Secondary | ICD-10-CM | POA: Diagnosis not present

## 2018-07-14 DIAGNOSIS — I513 Intracardiac thrombosis, not elsewhere classified: Secondary | ICD-10-CM | POA: Diagnosis not present

## 2018-07-14 DIAGNOSIS — N183 Chronic kidney disease, stage 3 (moderate): Secondary | ICD-10-CM | POA: Diagnosis not present

## 2018-07-14 DIAGNOSIS — S8251XD Displaced fracture of medial malleolus of right tibia, subsequent encounter for closed fracture with routine healing: Secondary | ICD-10-CM | POA: Diagnosis not present

## 2018-07-14 MED ORDER — HYDROCODONE-ACETAMINOPHEN 5-325 MG PO TABS: 1.0000 | ORAL_TABLET | Freq: Four times a day (QID) | ORAL | 0 refills | Status: DC | PRN

## 2018-07-14 NOTE — Telephone Encounter (Signed)
Hydrocodone-Acetaminophen  5/325 mg  Qty 28 Tablets  Take 1 tablet by mouth every 6 (six) hours as needed for moderate pain.  PATIENT USES [facility]

## 2018-07-16 DIAGNOSIS — N183 Chronic kidney disease, stage 3 (moderate): Secondary | ICD-10-CM | POA: Diagnosis not present

## 2018-07-16 DIAGNOSIS — I513 Intracardiac thrombosis, not elsewhere classified: Secondary | ICD-10-CM | POA: Diagnosis not present

## 2018-07-16 DIAGNOSIS — I5032 Chronic diastolic (congestive) heart failure: Secondary | ICD-10-CM | POA: Diagnosis not present

## 2018-07-16 DIAGNOSIS — I482 Chronic atrial fibrillation, unspecified: Secondary | ICD-10-CM | POA: Diagnosis not present

## 2018-07-16 DIAGNOSIS — S82832D Other fracture of upper and lower end of left fibula, subsequent encounter for closed fracture with routine healing: Secondary | ICD-10-CM | POA: Diagnosis not present

## 2018-07-16 DIAGNOSIS — S72402D Unspecified fracture of lower end of left femur, subsequent encounter for closed fracture with routine healing: Secondary | ICD-10-CM | POA: Diagnosis not present

## 2018-07-16 DIAGNOSIS — M810 Age-related osteoporosis without current pathological fracture: Secondary | ICD-10-CM | POA: Diagnosis not present

## 2018-07-16 DIAGNOSIS — I130 Hypertensive heart and chronic kidney disease with heart failure and stage 1 through stage 4 chronic kidney disease, or unspecified chronic kidney disease: Secondary | ICD-10-CM | POA: Diagnosis not present

## 2018-07-16 DIAGNOSIS — S8251XD Displaced fracture of medial malleolus of right tibia, subsequent encounter for closed fracture with routine healing: Secondary | ICD-10-CM | POA: Diagnosis not present

## 2018-07-16 DIAGNOSIS — S82192D Other fracture of upper end of left tibia, subsequent encounter for closed fracture with routine healing: Secondary | ICD-10-CM | POA: Diagnosis not present

## 2018-07-16 DIAGNOSIS — E1122 Type 2 diabetes mellitus with diabetic chronic kidney disease: Secondary | ICD-10-CM | POA: Diagnosis not present

## 2018-07-16 DIAGNOSIS — F329 Major depressive disorder, single episode, unspecified: Secondary | ICD-10-CM | POA: Diagnosis not present

## 2018-07-19 ENCOUNTER — Ambulatory Visit: Payer: BLUE CROSS/BLUE SHIELD | Admitting: Orthopedic Surgery

## 2018-07-19 DIAGNOSIS — S8251XD Displaced fracture of medial malleolus of right tibia, subsequent encounter for closed fracture with routine healing: Secondary | ICD-10-CM | POA: Diagnosis not present

## 2018-07-19 DIAGNOSIS — S72402D Unspecified fracture of lower end of left femur, subsequent encounter for closed fracture with routine healing: Secondary | ICD-10-CM | POA: Diagnosis not present

## 2018-07-19 DIAGNOSIS — N183 Chronic kidney disease, stage 3 (moderate): Secondary | ICD-10-CM | POA: Diagnosis not present

## 2018-07-19 DIAGNOSIS — I130 Hypertensive heart and chronic kidney disease with heart failure and stage 1 through stage 4 chronic kidney disease, or unspecified chronic kidney disease: Secondary | ICD-10-CM | POA: Diagnosis not present

## 2018-07-19 DIAGNOSIS — F329 Major depressive disorder, single episode, unspecified: Secondary | ICD-10-CM | POA: Diagnosis not present

## 2018-07-19 DIAGNOSIS — E1122 Type 2 diabetes mellitus with diabetic chronic kidney disease: Secondary | ICD-10-CM | POA: Diagnosis not present

## 2018-07-19 DIAGNOSIS — I482 Chronic atrial fibrillation, unspecified: Secondary | ICD-10-CM | POA: Diagnosis not present

## 2018-07-19 DIAGNOSIS — I513 Intracardiac thrombosis, not elsewhere classified: Secondary | ICD-10-CM | POA: Diagnosis not present

## 2018-07-19 DIAGNOSIS — M810 Age-related osteoporosis without current pathological fracture: Secondary | ICD-10-CM | POA: Diagnosis not present

## 2018-07-19 DIAGNOSIS — I5032 Chronic diastolic (congestive) heart failure: Secondary | ICD-10-CM | POA: Diagnosis not present

## 2018-07-19 DIAGNOSIS — S82832D Other fracture of upper and lower end of left fibula, subsequent encounter for closed fracture with routine healing: Secondary | ICD-10-CM | POA: Diagnosis not present

## 2018-07-19 DIAGNOSIS — S82192D Other fracture of upper end of left tibia, subsequent encounter for closed fracture with routine healing: Secondary | ICD-10-CM | POA: Diagnosis not present

## 2018-07-20 DIAGNOSIS — F329 Major depressive disorder, single episode, unspecified: Secondary | ICD-10-CM | POA: Diagnosis not present

## 2018-07-20 DIAGNOSIS — I513 Intracardiac thrombosis, not elsewhere classified: Secondary | ICD-10-CM | POA: Diagnosis not present

## 2018-07-20 DIAGNOSIS — S82192D Other fracture of upper end of left tibia, subsequent encounter for closed fracture with routine healing: Secondary | ICD-10-CM | POA: Diagnosis not present

## 2018-07-20 DIAGNOSIS — N183 Chronic kidney disease, stage 3 (moderate): Secondary | ICD-10-CM | POA: Diagnosis not present

## 2018-07-20 DIAGNOSIS — E1122 Type 2 diabetes mellitus with diabetic chronic kidney disease: Secondary | ICD-10-CM | POA: Diagnosis not present

## 2018-07-20 DIAGNOSIS — M810 Age-related osteoporosis without current pathological fracture: Secondary | ICD-10-CM | POA: Diagnosis not present

## 2018-07-20 DIAGNOSIS — I130 Hypertensive heart and chronic kidney disease with heart failure and stage 1 through stage 4 chronic kidney disease, or unspecified chronic kidney disease: Secondary | ICD-10-CM | POA: Diagnosis not present

## 2018-07-20 DIAGNOSIS — I482 Chronic atrial fibrillation, unspecified: Secondary | ICD-10-CM | POA: Diagnosis not present

## 2018-07-20 DIAGNOSIS — I5032 Chronic diastolic (congestive) heart failure: Secondary | ICD-10-CM | POA: Diagnosis not present

## 2018-07-20 DIAGNOSIS — S72402D Unspecified fracture of lower end of left femur, subsequent encounter for closed fracture with routine healing: Secondary | ICD-10-CM | POA: Diagnosis not present

## 2018-07-20 DIAGNOSIS — S8251XD Displaced fracture of medial malleolus of right tibia, subsequent encounter for closed fracture with routine healing: Secondary | ICD-10-CM | POA: Diagnosis not present

## 2018-07-20 DIAGNOSIS — S82832D Other fracture of upper and lower end of left fibula, subsequent encounter for closed fracture with routine healing: Secondary | ICD-10-CM | POA: Diagnosis not present

## 2018-07-21 ENCOUNTER — Ambulatory Visit: Payer: BLUE CROSS/BLUE SHIELD | Admitting: Cardiovascular Disease

## 2018-07-21 ENCOUNTER — Other Ambulatory Visit (HOSPITAL_COMMUNITY)
Admission: RE | Admit: 2018-07-21 | Discharge: 2018-07-21 | Disposition: A | Discharge status: Home / Self Care | Payer: BLUE CROSS/BLUE SHIELD | Source: Ambulatory Visit | Attending: Cardiovascular Disease | Admitting: Cardiovascular Disease

## 2018-07-21 ENCOUNTER — Encounter: Payer: Self-pay | Admitting: Cardiovascular Disease

## 2018-07-21 VITALS — BP 134/72 | HR 56 | Ht 64.0 in | Wt 250.0 lb

## 2018-07-21 DIAGNOSIS — I240 Acute coronary thrombosis not resulting in myocardial infarction: Secondary | ICD-10-CM

## 2018-07-21 DIAGNOSIS — I10 Essential (primary) hypertension: Secondary | ICD-10-CM | POA: Diagnosis not present

## 2018-07-21 DIAGNOSIS — I4819 Other persistent atrial fibrillation: Secondary | ICD-10-CM

## 2018-07-21 DIAGNOSIS — I5032 Chronic diastolic (congestive) heart failure: Secondary | ICD-10-CM

## 2018-07-21 DIAGNOSIS — I513 Intracardiac thrombosis, not elsewhere classified: Secondary | ICD-10-CM | POA: Diagnosis not present

## 2018-07-21 LAB — CBC
HCT: 34.1 % — ABNORMAL LOW (ref 36.0–46.0)
Hemoglobin: 9.7 g/dL — ABNORMAL LOW (ref 12.0–15.0)
MCH: 26.6 pg (ref 26.0–34.0)
MCHC: 28.4 g/dL — ABNORMAL LOW (ref 30.0–36.0)
MCV: 93.4 fL (ref 80.0–100.0)
Platelets: 276 10*3/uL (ref 150–400)
RBC: 3.65 MIL/uL — ABNORMAL LOW (ref 3.87–5.11)
RDW: 18.3 % — ABNORMAL HIGH (ref 11.5–15.5)
WBC: 7.4 10*3/uL (ref 4.0–10.5)
nRBC: 0 % (ref 0.0–0.2)

## 2018-07-21 LAB — BRAIN NATRIURETIC PEPTIDE: B Natriuretic Peptide: 751 pg/mL — ABNORMAL HIGH (ref 0.0–100.0)

## 2018-07-21 LAB — BASIC METABOLIC PANEL
Anion gap: 9 (ref 5–15)
BUN: 18 mg/dL (ref 8–23)
CO2: 24 mmol/L (ref 22–32)
Calcium: 8.7 mg/dL — ABNORMAL LOW (ref 8.9–10.3)
Chloride: 106 mmol/L (ref 98–111)
Creatinine, Ser: 0.82 mg/dL (ref 0.44–1.00)
GFR calc non Af Amer: >60 mL/min (ref >60)
Glucose, Bld: 223 mg/dL — ABNORMAL HIGH (ref 70–99)
Potassium: 4.4 mmol/L (ref 3.5–5.1)
Sodium: 139 mmol/L (ref 135–145)

## 2018-07-21 MED ORDER — APIXABAN 5 MG PO TABS: 5.0000 mg | ORAL_TABLET | Freq: Two times a day (BID) | ORAL | 3 refills | Status: DC

## 2018-07-21 MED ORDER — POTASSIUM CHLORIDE CRYS ER 20 MEQ PO TBCR: 20.0000 meq | EXTENDED_RELEASE_TABLET | Freq: Every day | ORAL | 11 refills | Status: DC

## 2018-07-21 MED ORDER — DILTIAZEM HCL ER COATED BEADS 120 MG PO CP24: 120.0000 mg | ORAL_CAPSULE | Freq: Every day | ORAL | 3 refills | Status: DC

## 2018-07-21 MED ORDER — FUROSEMIDE 20 MG PO TABS: 20.0000 mg | ORAL_TABLET | Freq: Every day | ORAL | 11 refills | Status: DC

## 2018-07-21 NOTE — Progress Notes (Signed)
SUBJECTIVE: The patient presents to establish care with me in our [place] office.  She was hospitalized in August 2019 for new onset rapid atrial fibrillation.  She underwent TEE on 04/05/2018 and was unfortunately found to have a large left ventricular thrombus.  She was on Lopressor and apixaban.  Left ventricular systolic function was normal, LVEF 55 to 60%.  She also had acute diastolic heart failure driven by rapid atrial fibrillation.  She is here with her husband.  Apparently after being discharged from the hospital she went home and fell and broke both of her legs.  She has been receiving physical therapy at home with home nursing visits as well.  She has had a difficult time with transportation and has been unable to see her PCP.  She has run out of all of her medications.  She did not take either metoprolol or diltiazem this morning and took them both last night.  She is short of breath with minimal exertion.  She has to prop herself up with 3 pillows to sleep.  She denies chest pain.  She has leg swelling alleviated with Lasix 20 mg daily.  The home nurse has told her her lungs are clear and blood pressure has been good.    Review of Systems: As per "subjective", otherwise negative.  Allergies  Allergen Reactions  . Latex Itching and Rash    Current Outpatient Medications  Medication Sig Dispense Refill  . acetaminophen (TYLENOL) 500 MG tablet Take 1,500-2,000 mg by mouth every 6 (six) hours as needed for mild pain or headache.    Marland Kitchen apixaban (ELIQUIS) 5 MG TABS tablet Take 1 tablet (5 mg total) by mouth 2 (two) times daily. 60 tablet 0  . buPROPion (WELLBUTRIN XL) 300 MG 24 hr tablet Take 300 mg by mouth daily.    Marland Kitchen diltiazem (CARDIZEM) 30 MG tablet Take 1 tablet (30 mg total) by mouth every 8 (eight) hours.    . docusate sodium (COLACE) 100 MG capsule Take 100 mg by mouth 2 (two) times daily as needed for mild constipation.    . fluticasone (FLONASE) 50 MCG/ACT  nasal spray Place 2 sprays into both nostrils daily.    . furosemide (LASIX) 20 MG tablet Take 1 tablet by mouth daily.    Marland Kitchen glipiZIDE (GLUCOTROL) 10 MG tablet Take 10 mg by mouth 2 (two) times daily.  2  . HYDROcodone-acetaminophen (NORCO/VICODIN) 5-325 MG tablet Take 1 tablet by mouth every 6 (six) hours as needed for moderate pain. 28 tablet 0  . loratadine (CLARITIN) 10 MG tablet Take 10 mg by mouth daily.    . metoprolol tartrate 75 MG TABS Take 150 mg by mouth 2 (two) times daily. 60 tablet 1  . pioglitazone (ACTOS) 45 MG tablet Take 45 mg by mouth daily.  2  . potassium chloride SA (K-DUR,KLOR-CON) 20 MEQ tablet Take 1 tablet (20 mEq total) by mouth daily.    Marland Kitchen SPIRIVA RESPIMAT 1.25 MCG/ACT AERS Inhale 2 sprays into the lungs daily.  2   No current facility-administered medications for this visit.     Past Medical History:  Diagnosis Date  . Depression   . Diabetes mellitus without complication ([place])   . Hypertension   . Morbid obesity ([place])   . Osteoporosis   . UTI (lower urinary tract infection) 01/2016   Cipro for Klebsiella pneumoniae isolate    Past Surgical History:  Procedure Laterality Date  . ABDOMINAL HYSTERECTOMY    . CESAREAN  SECTION    . CHOLECYSTECTOMY    . FEMUR IM NAIL Left 02/20/2016  . FEMUR IM NAIL Left 02/20/2016   Procedure: INTRAMEDULLARY (IM) RETROGRADE FEMORAL NAILING;  Surgeon: Leandrew Koyanagi, MD;  Location: [facility];  Service: Orthopedics;  Laterality: Left;  . PANCREAS SURGERY  1967   1 cyst excised and one cyst drained  . TEE WITHOUT CARDIOVERSION N/A 04/05/2018   Procedure: TRANSESOPHAGEAL ECHOCARDIOGRAM (TEE);  Surgeon: Dorothy Spark, MD;  Location: [facility];  Service: Cardiovascular;  Laterality: N/A;  . [place]    . WRIST FRACTURE SURGERY      Social History   Socioeconomic History  . Marital status: Married    Spouse name: Not on file  . Number of children: Not on file  . Years of education: Not on file  .  Highest education level: Not on file  Occupational History  . Occupation: retired  Scientific laboratory technician  . Financial resource strain: Not on file  . Food insecurity:    Worry: Not on file    Inability: Not on file  . Transportation needs:    Medical: Not on file    Non-medical: Not on file  Tobacco Use  . Smoking status: Never Smoker  . Smokeless tobacco: Never Used  Substance and Sexual Activity  . Alcohol use: No  . Drug use: No  . Sexual activity: Not on file  Lifestyle  . Physical activity:    Days per week: Not on file    Minutes per session: Not on file  . Stress: Not on file  Relationships  . Social connections:    Talks on phone: Not on file    Gets together: Not on file    Attends religious service: Not on file    Active member of club or organization: Not on file    Attends meetings of clubs or organizations: Not on file    Relationship status: Not on file  . Intimate partner violence:    Fear of current or ex partner: Not on file    Emotionally abused: Not on file    Physically abused: Not on file    Forced sexual activity: Not on file  Other Topics Concern  . Not on file  Social History Narrative   Lives in [place] with husband.  More or less w/c bound since leg fx about 3 yrs ago.     Vitals:   07/21/18 1004 07/21/18 1016  BP: 140/70 134/72  Pulse: (!) 56   SpO2: 98%   Weight: 250 lb (113.4 kg)   Height: 5' 4"  (1.626 m)     Wt Readings from Last 3 Encounters:  07/21/18 250 lb (113.4 kg)  04/15/18 265 lb 10.5 oz (120.5 kg)  04/13/18 265 lb 10.5 oz (120.5 kg)     PHYSICAL EXAM General: NAD, sitting in wheelchair. HEENT: Normal. Neck: No JVD, no thyromegaly. Lungs: Clear to auscultation bilaterally with normal respiratory effort. CV: Regular rate and irregular rhythm, normal S1/S2, no S3, no murmur.  Trace bilateral pretibial and peri-ankle edema.    Abdomen: Soft, nontender, obese.  Neurologic: Alert and oriented.  Psych: Normal affect. Skin:  Normal. Musculoskeletal: No gross deformities.    ECG: Reviewed above under Subjective   Labs: Lab Results  Component Value Date/Time   K 3.1 (L) 04/26/2018 05:10 AM   BUN 18 04/26/2018 05:10 AM   CREATININE 1.05 (H) 04/26/2018 05:10 AM   ALT 50 (H) 04/14/2018 05:14 AM  TSH 1.398 04/01/2018 10:45 PM   HGB 9.6 (L) 04/26/2018 05:10 AM     Lipids: Lab Results  Component Value Date/Time   LDLCALC 30 04/04/2018 03:50 AM   CHOL 98 04/04/2018 03:50 AM   TRIG 90 04/04/2018 03:50 AM   HDL 50 04/04/2018 03:50 AM       ASSESSMENT AND PLAN: 1.  Persistent atrial fibrillation: Currently on short acting diltiazem 30 mg 3 times daily and Lopressor 150 mg twice daily, both of which she took last night but not this morning.  Anticoagulated with apixaban 5 mg twice daily.  For feasibility, I will discontinue Lopressor and switch diltiazem to Cardizem CD 120 mg daily.  After she has been on this regimen for a week, I will obtain a 1 week event monitor to assess heart rate control.  I will also check a CBC, basic metabolic panel, and BNP while she is at [facility] today.  2.  Chronic diastolic heart failure: Euvolemic on Lasix 20 mg daily.  She is also on supplemental potassium.  Creatinine 1.05 on 04/26/2018.  I will check a BNP, CBC, and basic metabolic panel while she is at [facility] today.  3.  Hypertension: Blood pressure is controlled, 134/72.  I will monitor given medication adjustments as noted above.  4.  Left ventricular thrombus: Remains on apixaban for atrial fibrillation as well.    Disposition: Follow up 3 months  Time spent: 40 minutes, of which greater than 50% was spent reviewing symptoms, relevant blood tests and studies, and discussing management plan with the patient.    Kate Sable, M.D., F.A.C.C.

## 2018-07-21 NOTE — Patient Instructions (Signed)
Medication Instructions:  STOP METOPROLOL   INCREASE DILTIAZEM TO 120 MG ONCE DAILY   Labwork:TODAY  BNP BMET CBC   Testing/Procedures: Your physician has recommended that you wear an event monitor. Event monitors are medical devices that record the heart's electrical activity. Doctors most often [place] these monitors to diagnose arrhythmias. Arrhythmias are problems with the speed or rhythm of the heartbeat. The monitor is a small, portable device. You can wear one while you do your normal daily activities. This is usually used to diagnose what is causing palpitations/syncope (passing out).    Follow-Up: Your physician recommends that you schedule a follow-up appointment in: [place]   Your physician recommends that you schedule a follow-up appointment in:  [place]    Any Other Special Instructions Will Be Listed Below (If Applicable).     If you need a refill on your cardiac medications before your next appointment, please call your pharmacy.

## 2018-07-21 NOTE — Addendum Note (Signed)
Addended by: Debbora Lacrosse R on: 07/21/2018 12:10 PM   Modules accepted: Orders

## 2018-07-22 DIAGNOSIS — I513 Intracardiac thrombosis, not elsewhere classified: Secondary | ICD-10-CM | POA: Diagnosis not present

## 2018-07-22 DIAGNOSIS — N183 Chronic kidney disease, stage 3 (moderate): Secondary | ICD-10-CM | POA: Diagnosis not present

## 2018-07-22 DIAGNOSIS — S82192D Other fracture of upper end of left tibia, subsequent encounter for closed fracture with routine healing: Secondary | ICD-10-CM | POA: Diagnosis not present

## 2018-07-22 DIAGNOSIS — F329 Major depressive disorder, single episode, unspecified: Secondary | ICD-10-CM | POA: Diagnosis not present

## 2018-07-22 DIAGNOSIS — S8251XD Displaced fracture of medial malleolus of right tibia, subsequent encounter for closed fracture with routine healing: Secondary | ICD-10-CM | POA: Diagnosis not present

## 2018-07-22 DIAGNOSIS — I5032 Chronic diastolic (congestive) heart failure: Secondary | ICD-10-CM | POA: Diagnosis not present

## 2018-07-22 DIAGNOSIS — S82832D Other fracture of upper and lower end of left fibula, subsequent encounter for closed fracture with routine healing: Secondary | ICD-10-CM | POA: Diagnosis not present

## 2018-07-22 DIAGNOSIS — I130 Hypertensive heart and chronic kidney disease with heart failure and stage 1 through stage 4 chronic kidney disease, or unspecified chronic kidney disease: Secondary | ICD-10-CM | POA: Diagnosis not present

## 2018-07-22 DIAGNOSIS — I482 Chronic atrial fibrillation, unspecified: Secondary | ICD-10-CM | POA: Diagnosis not present

## 2018-07-22 DIAGNOSIS — M810 Age-related osteoporosis without current pathological fracture: Secondary | ICD-10-CM | POA: Diagnosis not present

## 2018-07-22 DIAGNOSIS — E1122 Type 2 diabetes mellitus with diabetic chronic kidney disease: Secondary | ICD-10-CM | POA: Diagnosis not present

## 2018-07-22 DIAGNOSIS — S72402D Unspecified fracture of lower end of left femur, subsequent encounter for closed fracture with routine healing: Secondary | ICD-10-CM | POA: Diagnosis not present

## 2018-07-23 ENCOUNTER — Telehealth: Payer: Self-pay

## 2018-07-23 DIAGNOSIS — I10 Essential (primary) hypertension: Secondary | ICD-10-CM

## 2018-07-23 NOTE — Telephone Encounter (Signed)
Pt made aware, copy to pcp. Placed order for chest x-ray.

## 2018-07-23 NOTE — Telephone Encounter (Signed)
-----   Message from Herminio Commons, MD sent at 07/21/2018 12:02 PM EST ----- BNP is elevated but lower than what it was 3 months ago.  This indicates increased pressures within the heart.  Please obtain a chest x-ray.

## 2018-07-26 ENCOUNTER — Ambulatory Visit (INDEPENDENT_AMBULATORY_CARE_PROVIDER_SITE_OTHER): Payer: BLUE CROSS/BLUE SHIELD | Admitting: Orthopedic Surgery

## 2018-07-26 ENCOUNTER — Telehealth: Payer: Self-pay | Admitting: Orthopedic Surgery

## 2018-07-26 ENCOUNTER — Encounter: Payer: Self-pay | Admitting: Orthopedic Surgery

## 2018-07-26 ENCOUNTER — Ambulatory Visit (HOSPITAL_COMMUNITY)
Admission: RE | Admit: 2018-07-26 | Discharge: 2018-07-26 | Disposition: A | Discharge status: Home / Self Care | Payer: BLUE CROSS/BLUE SHIELD | Source: Ambulatory Visit | Attending: Cardiovascular Disease | Admitting: Cardiovascular Disease

## 2018-07-26 ENCOUNTER — Ambulatory Visit (INDEPENDENT_AMBULATORY_CARE_PROVIDER_SITE_OTHER): Payer: BLUE CROSS/BLUE SHIELD

## 2018-07-26 ENCOUNTER — Ambulatory Visit: Payer: BLUE CROSS/BLUE SHIELD

## 2018-07-26 ENCOUNTER — Other Ambulatory Visit: Payer: Self-pay | Admitting: Cardiovascular Disease

## 2018-07-26 VITALS — BP 146/81 | HR 77 | Resp 18

## 2018-07-26 DIAGNOSIS — I10 Essential (primary) hypertension: Secondary | ICD-10-CM

## 2018-07-26 DIAGNOSIS — S82891D Other fracture of right lower leg, subsequent encounter for closed fracture with routine healing: Secondary | ICD-10-CM | POA: Diagnosis not present

## 2018-07-26 DIAGNOSIS — S82402D Unspecified fracture of shaft of left fibula, subsequent encounter for closed fracture with routine healing: Secondary | ICD-10-CM

## 2018-07-26 DIAGNOSIS — R0602 Shortness of breath: Secondary | ICD-10-CM | POA: Diagnosis not present

## 2018-07-26 DIAGNOSIS — S82202D Unspecified fracture of shaft of left tibia, subsequent encounter for closed fracture with routine healing: Secondary | ICD-10-CM | POA: Diagnosis not present

## 2018-07-26 DIAGNOSIS — I4819 Other persistent atrial fibrillation: Secondary | ICD-10-CM

## 2018-07-26 MED ORDER — HYDROCODONE-ACETAMINOPHEN 5-325 MG PO TABS: 1.0000 | ORAL_TABLET | Freq: Four times a day (QID) | ORAL | 0 refills | Status: AC | PRN

## 2018-07-26 NOTE — Progress Notes (Signed)
Patient ID: [name], female   DOB: Aug 16, 1951, 67 y.o.   MRN: [id number]  Routine follow-up  Date of injury was April 13, 2018  Patient comes in for routine follow-up for x-rays ankle and tibia right and left respectively.  Complains of mild to moderate pain  Chief Complaint  Patient presents with  . Fracture    DOI 04/13/18    Encounter Diagnoses  Name Primary?  . Closed fracture of left tibia and fibula with routine healing 04/13/18 Yes  . Closed fracture of right ankle with routine healing, subsequent encounter 04/13/18    Review of systems complains of right foot ingrown toenail seeing podiatrist no treatment needed by's  BP (!) 146/81   Pulse 77   Resp 18   Right ankle range of motion has store to normal ankle alignment is normal there is no fracture tenderness on the medial or lateral side the skin looks good.  Ankle joint is stable  Left knee stiffness multiple scars from prior procedures.  No tenderness at the fracture site overall limb alignment is normal  No muscle atrophy is noted knee range of motion is definitely decreased to about 90 degrees full extension is noted.  [email or web address] search completed    Meds ordered this encounter  Medications  . HYDROcodone-acetaminophen (NORCO/VICODIN) 5-325 MG tablet    Sig: Take 1 tablet by mouth every 6 (six) hours as needed for up to 7 days for moderate pain.    Dispense:  28 tablet    Refill:  0    She is released to normal activities with physical therapy to improve her gait  This will be her last opioid prescription.

## 2018-07-26 NOTE — Telephone Encounter (Signed)
I called her back to answer her question, she states she is asking about regaining her strength. She was in a wheelchair prior to injury, I told her the goal is to get her back to baseline where she was before the injury. Physical therapy will progress as tolerated She has voiced understanding  To you FYI

## 2018-07-26 NOTE — Telephone Encounter (Signed)
Patient has questions, mentioned at check-out from visit today, 07/26/18, regarding re-gaining mobility. Please call.

## 2018-07-26 NOTE — Patient Instructions (Signed)
FULL ACTIVITY ALLOWED NO RESTRICTIONS  What You Need to Know About Prescription Opioid Pain Medicine        Please be advised. You are on a medication which is classified as an "opiod". The CDC the [facility]  has recently advised all providers to advise patient's that these medications have certain risks which include but are not limited to:    drug intolerance  drug addiction  respiratory depression   respiratory failure  Death  Please keep these medications locked away. If you feel that you are becoming addicted to these medicines or you are having difficulties with these medications please alert your provider.   As your provider I will attempt to wean you off of these medications when you're severe acute pain has been taking care of. However, if we cannot wean you off of this medication you will be sent to a pain management center where they can better manage chronic pain   Opioids are powerful medicines that are used to treat moderate to severe pain. Opioids should be taken with the supervision of a trained health care provider. They should be taken for the shortest period of time as possible. This is because opioids can be addictive and the longer you take opioids, the greater your risk of addiction (opioid use disorder). What do opioids do? Opioids help reduce or eliminate pain. When used for short periods of time, they can help you:  Sleep better.  Do better in physical or occupational therapy.  Feel better in the first few days after an injury.  Recover from surgery. What kind of problems can opioids cause? Opioids can cause side effects, such as:  Constipation.  Nausea.  Vomiting.  Drowsiness.  Confusion.  Opioid use disorder.  Breathing difficulties (respiratory depression). Using opioid pain medicines for longer than 3 days increases your risk of these side effects. Taking opioid pain medicine for a long period of time can affect  your ability to do daily tasks. It also puts you at risk for:  Car accidents.  Heart attack.  Overdose, which can sometimes lead to death. What can increase my risk for developing problems while taking opioids? You may be at an especially high risk for problems while taking opioids if you:  Are over the age of 36.  Are pregnant.  Have kidney or liver disease.  Have certain mental health conditions, such as depression or anxiety.  Have a history of substance use disorder.  Have had an opioid overdose in the past. How do I stop taking opioids if I have been taking them for a long time? If you have been taking opioid medicine for more than a few weeks, you may need to slowly stop taking them (taper). Tapering your use of opioids can decrease your chances of experiencing withdrawal symptoms, such as:  Abdominal pain and cramping.  Nausea.  Sweating.  Sleepiness.  Restlessness.  Uncontrollable shaking (tremors).  Cravings for the medicine. Do not attempt to taper your use of opioids on your own. Talk with your health care provider about how to do this. Your health care provider may prescribe a step-down schedule based on how much medicine you are taking and how long you have been taking it. What are the benefits of stopping the use of opioids? By switching from opioid pain medicine to non-opioid pain management options, you will decrease your risk of accidents and injuries associated with long-term opioid use. You will also be able to:  Monitor your pain  more accurately and know when to seek medical care if it is not improving.  Decrease risk to others around you. Having opioids in the home increases the risk for accidental or intentional use or overdose by others. How can I treat pain without opioids? Pain can be managed with many types of alternative treatments. Ask your health care provider to refer you to one or more specialists who can help you manage pain  through:  Physical or occupational therapy.  Counseling (cognitive-behavioral therapy).  Good nutrition.  Biofeedback.  Massage.  Meditation.  Non-opioid medicine.  Following a gentle exercise program. Where can I get support? If you have been taking opioids for a long time, you may benefit from receiving support for quitting from a local support group or counselor. Ask your health care provider for a referral to these resources in your area. When should I seek medical care? Seek medical care right away if you are taking opioids and you experience any of the following:  Difficulty breathing.  Breathing that is more shallow or slower than normal.  A very slow heartbeat (pulse).  Severe confusion.  Unconsciousness.  Sleepiness.  Difficulty waking from sleep.  Slurred speech.  Nausea and vomiting.  Cold, clammy skin.  Blue lips or fingernails.  Limpness.  Abnormally small pupils. If you think that you or someone else may have taken too much of an opioid medicine, get medical help right away. Do not wait to see if the symptoms go away on their own. Call your local emergency services (911 in the [place] or call the hotline of the [facility] 208-079-1813 in the [place].).  Where can I get more information? To learn more about opioid medicines, visit the Centers for Disease Control and Prevention web site Opioid Basics at https://keller-santana.com/. Summary  Opioid medicines can help you manage moderate-to-severe pain for a short period of time.  Taking opioid pain medicine for a long period of time puts you at risk for unintentional accidents, injury, and even death.  If you think that you or someone else may have taken too much of an opioid, get medical help right away. This information is not intended to replace advice given to you by your health care provider. Make sure you discuss any questions you have with your health  care provider. Document Released: 08/31/2015 Document Revised: 03/28/2016 Document Reviewed: 03/16/2015 Elsevier Interactive Patient Education  2017 [employer].

## 2018-07-27 ENCOUNTER — Telehealth: Payer: Self-pay

## 2018-07-27 ENCOUNTER — Telehealth: Payer: Self-pay | Admitting: Radiology

## 2018-07-27 DIAGNOSIS — S82202D Unspecified fracture of shaft of left tibia, subsequent encounter for closed fracture with routine healing: Secondary | ICD-10-CM

## 2018-07-27 DIAGNOSIS — S82891D Other fracture of right lower leg, subsequent encounter for closed fracture with routine healing: Secondary | ICD-10-CM

## 2018-07-27 DIAGNOSIS — S82402D Unspecified fracture of shaft of left fibula, subsequent encounter for closed fracture with routine healing: Principal | ICD-10-CM

## 2018-07-27 MED ORDER — AZITHROMYCIN 250 MG PO TABS: ORAL_TABLET | ORAL | 0 refills | Status: DC

## 2018-07-27 NOTE — Telephone Encounter (Signed)
-----   Message from [facility] sent at 07/27/2018 11:58 AM EST ----- Yes Amy please send in new orders thank you! ----- Message ----- From: Candice Camp, RT Sent: 07/26/2018   9:47 AM EST To: Romualdo Bolk  Patient is active now with physical therapy, Dr Aline Brochure has progressed her to [facility] and she needs balance and strengthening now, do I need to send new orders? Or can you extend previous orders?

## 2018-07-27 NOTE — Telephone Encounter (Signed)
  Pt made aware. Voiced understanding. Copy to pcp. Sent in RX for Z-Pack.

## 2018-07-27 NOTE — Telephone Encounter (Signed)
-----   Message from Herminio Commons, MD sent at 07/26/2018 10:02 AM EST ----- Possible pneumonia. Please prescribe Z-pak.

## 2018-07-28 DIAGNOSIS — E1122 Type 2 diabetes mellitus with diabetic chronic kidney disease: Secondary | ICD-10-CM | POA: Diagnosis not present

## 2018-07-28 DIAGNOSIS — S82192D Other fracture of upper end of left tibia, subsequent encounter for closed fracture with routine healing: Secondary | ICD-10-CM | POA: Diagnosis not present

## 2018-07-28 DIAGNOSIS — F329 Major depressive disorder, single episode, unspecified: Secondary | ICD-10-CM | POA: Diagnosis not present

## 2018-07-28 DIAGNOSIS — M810 Age-related osteoporosis without current pathological fracture: Secondary | ICD-10-CM | POA: Diagnosis not present

## 2018-07-28 DIAGNOSIS — S8251XD Displaced fracture of medial malleolus of right tibia, subsequent encounter for closed fracture with routine healing: Secondary | ICD-10-CM | POA: Diagnosis not present

## 2018-07-28 DIAGNOSIS — I513 Intracardiac thrombosis, not elsewhere classified: Secondary | ICD-10-CM | POA: Diagnosis not present

## 2018-07-28 DIAGNOSIS — S72402D Unspecified fracture of lower end of left femur, subsequent encounter for closed fracture with routine healing: Secondary | ICD-10-CM | POA: Diagnosis not present

## 2018-07-28 DIAGNOSIS — I5032 Chronic diastolic (congestive) heart failure: Secondary | ICD-10-CM | POA: Diagnosis not present

## 2018-07-28 DIAGNOSIS — N183 Chronic kidney disease, stage 3 (moderate): Secondary | ICD-10-CM | POA: Diagnosis not present

## 2018-07-28 DIAGNOSIS — S82832D Other fracture of upper and lower end of left fibula, subsequent encounter for closed fracture with routine healing: Secondary | ICD-10-CM | POA: Diagnosis not present

## 2018-07-28 DIAGNOSIS — I482 Chronic atrial fibrillation, unspecified: Secondary | ICD-10-CM | POA: Diagnosis not present

## 2018-07-28 DIAGNOSIS — I130 Hypertensive heart and chronic kidney disease with heart failure and stage 1 through stage 4 chronic kidney disease, or unspecified chronic kidney disease: Secondary | ICD-10-CM | POA: Diagnosis not present

## 2018-07-29 DIAGNOSIS — I513 Intracardiac thrombosis, not elsewhere classified: Secondary | ICD-10-CM | POA: Diagnosis not present

## 2018-07-29 DIAGNOSIS — S82192D Other fracture of upper end of left tibia, subsequent encounter for closed fracture with routine healing: Secondary | ICD-10-CM | POA: Diagnosis not present

## 2018-07-29 DIAGNOSIS — I130 Hypertensive heart and chronic kidney disease with heart failure and stage 1 through stage 4 chronic kidney disease, or unspecified chronic kidney disease: Secondary | ICD-10-CM | POA: Diagnosis not present

## 2018-07-29 DIAGNOSIS — E1122 Type 2 diabetes mellitus with diabetic chronic kidney disease: Secondary | ICD-10-CM | POA: Diagnosis not present

## 2018-07-29 DIAGNOSIS — S82832D Other fracture of upper and lower end of left fibula, subsequent encounter for closed fracture with routine healing: Secondary | ICD-10-CM | POA: Diagnosis not present

## 2018-07-29 DIAGNOSIS — S8251XD Displaced fracture of medial malleolus of right tibia, subsequent encounter for closed fracture with routine healing: Secondary | ICD-10-CM | POA: Diagnosis not present

## 2018-07-29 DIAGNOSIS — I5032 Chronic diastolic (congestive) heart failure: Secondary | ICD-10-CM | POA: Diagnosis not present

## 2018-07-29 DIAGNOSIS — N183 Chronic kidney disease, stage 3 (moderate): Secondary | ICD-10-CM | POA: Diagnosis not present

## 2018-07-29 DIAGNOSIS — F329 Major depressive disorder, single episode, unspecified: Secondary | ICD-10-CM | POA: Diagnosis not present

## 2018-07-29 DIAGNOSIS — S72402D Unspecified fracture of lower end of left femur, subsequent encounter for closed fracture with routine healing: Secondary | ICD-10-CM | POA: Diagnosis not present

## 2018-07-29 DIAGNOSIS — M810 Age-related osteoporosis without current pathological fracture: Secondary | ICD-10-CM | POA: Diagnosis not present

## 2018-07-29 DIAGNOSIS — I482 Chronic atrial fibrillation, unspecified: Secondary | ICD-10-CM | POA: Diagnosis not present

## 2018-07-30 ENCOUNTER — Ambulatory Visit: Payer: BLUE CROSS/BLUE SHIELD

## 2018-07-30 ENCOUNTER — Telehealth: Payer: Self-pay | Admitting: Cardiovascular Disease

## 2018-07-30 DIAGNOSIS — I482 Chronic atrial fibrillation, unspecified: Secondary | ICD-10-CM | POA: Diagnosis not present

## 2018-07-30 DIAGNOSIS — S8251XD Displaced fracture of medial malleolus of right tibia, subsequent encounter for closed fracture with routine healing: Secondary | ICD-10-CM | POA: Diagnosis not present

## 2018-07-30 DIAGNOSIS — S72402D Unspecified fracture of lower end of left femur, subsequent encounter for closed fracture with routine healing: Secondary | ICD-10-CM | POA: Diagnosis not present

## 2018-07-30 DIAGNOSIS — I5032 Chronic diastolic (congestive) heart failure: Secondary | ICD-10-CM | POA: Diagnosis not present

## 2018-07-30 DIAGNOSIS — F329 Major depressive disorder, single episode, unspecified: Secondary | ICD-10-CM | POA: Diagnosis not present

## 2018-07-30 DIAGNOSIS — E1122 Type 2 diabetes mellitus with diabetic chronic kidney disease: Secondary | ICD-10-CM | POA: Diagnosis not present

## 2018-07-30 DIAGNOSIS — M810 Age-related osteoporosis without current pathological fracture: Secondary | ICD-10-CM | POA: Diagnosis not present

## 2018-07-30 DIAGNOSIS — S82192D Other fracture of upper end of left tibia, subsequent encounter for closed fracture with routine healing: Secondary | ICD-10-CM | POA: Diagnosis not present

## 2018-07-30 DIAGNOSIS — S82832D Other fracture of upper and lower end of left fibula, subsequent encounter for closed fracture with routine healing: Secondary | ICD-10-CM | POA: Diagnosis not present

## 2018-07-30 DIAGNOSIS — I130 Hypertensive heart and chronic kidney disease with heart failure and stage 1 through stage 4 chronic kidney disease, or unspecified chronic kidney disease: Secondary | ICD-10-CM | POA: Diagnosis not present

## 2018-07-30 DIAGNOSIS — I513 Intracardiac thrombosis, not elsewhere classified: Secondary | ICD-10-CM | POA: Diagnosis not present

## 2018-07-30 DIAGNOSIS — N183 Chronic kidney disease, stage 3 (moderate): Secondary | ICD-10-CM | POA: Diagnosis not present

## 2018-07-30 NOTE — Telephone Encounter (Signed)
Patient being followed for pneumonia diagnosed by Dr.Koneswaran.[name] with [facility] wants to follow her until she is finished antibiotics, has two fractured legas and is just now allowed to bare weight by Dr.Harrison

## 2018-07-30 NOTE — Telephone Encounter (Signed)
Wants okay to extend [facility] visits for couple more weeks. / tg

## 2018-08-02 ENCOUNTER — Telehealth: Payer: Self-pay | Admitting: Orthopedic Surgery

## 2018-08-02 DIAGNOSIS — R000 Tachycardia, unspecified: Secondary | ICD-10-CM | POA: Diagnosis not present

## 2018-08-02 DIAGNOSIS — R457 State of emotional shock and stress, unspecified: Secondary | ICD-10-CM | POA: Diagnosis not present

## 2018-08-02 DIAGNOSIS — I4891 Unspecified atrial fibrillation: Secondary | ICD-10-CM | POA: Diagnosis not present

## 2018-08-02 NOTE — Telephone Encounter (Signed)
Done

## 2018-08-02 NOTE — Telephone Encounter (Signed)
Called to provide verbal order left message

## 2018-08-02 NOTE — Telephone Encounter (Signed)
Call received from Amy, therapist at [facility], requests verbal orders to continue home therapy, 2 times a week for 4 weeks. Please call direct number (secure voice message may be left) at Ph# [phone]

## 2018-08-03 ENCOUNTER — Telehealth: Payer: Self-pay | Admitting: Cardiovascular Disease

## 2018-08-03 DIAGNOSIS — F329 Major depressive disorder, single episode, unspecified: Secondary | ICD-10-CM | POA: Diagnosis not present

## 2018-08-03 DIAGNOSIS — S8251XD Displaced fracture of medial malleolus of right tibia, subsequent encounter for closed fracture with routine healing: Secondary | ICD-10-CM | POA: Diagnosis not present

## 2018-08-03 DIAGNOSIS — I5032 Chronic diastolic (congestive) heart failure: Secondary | ICD-10-CM | POA: Diagnosis not present

## 2018-08-03 DIAGNOSIS — S72402D Unspecified fracture of lower end of left femur, subsequent encounter for closed fracture with routine healing: Secondary | ICD-10-CM | POA: Diagnosis not present

## 2018-08-03 DIAGNOSIS — M810 Age-related osteoporosis without current pathological fracture: Secondary | ICD-10-CM | POA: Diagnosis not present

## 2018-08-03 DIAGNOSIS — I513 Intracardiac thrombosis, not elsewhere classified: Secondary | ICD-10-CM | POA: Diagnosis not present

## 2018-08-03 DIAGNOSIS — N183 Chronic kidney disease, stage 3 (moderate): Secondary | ICD-10-CM | POA: Diagnosis not present

## 2018-08-03 DIAGNOSIS — E1122 Type 2 diabetes mellitus with diabetic chronic kidney disease: Secondary | ICD-10-CM | POA: Diagnosis not present

## 2018-08-03 DIAGNOSIS — S82192D Other fracture of upper end of left tibia, subsequent encounter for closed fracture with routine healing: Secondary | ICD-10-CM | POA: Diagnosis not present

## 2018-08-03 DIAGNOSIS — I482 Chronic atrial fibrillation, unspecified: Secondary | ICD-10-CM | POA: Diagnosis not present

## 2018-08-03 DIAGNOSIS — S82832D Other fracture of upper and lower end of left fibula, subsequent encounter for closed fracture with routine healing: Secondary | ICD-10-CM | POA: Diagnosis not present

## 2018-08-03 DIAGNOSIS — I130 Hypertensive heart and chronic kidney disease with heart failure and stage 1 through stage 4 chronic kidney disease, or unspecified chronic kidney disease: Secondary | ICD-10-CM | POA: Diagnosis not present

## 2018-08-03 NOTE — Telephone Encounter (Signed)
[name] w/ [facility] called stating pt was dx w/ pneumonia after her chest xray that Dr. Bronson Ing ordered for her.   She has finished her antibiotic, but she's still having problems.    Pt has had increased SOB, wakes up gasping for air, trouble breathing and is still coughing up mucus.   Please give [name] a call @ [phone]

## 2018-08-03 NOTE — Telephone Encounter (Signed)
Will forward to Dr. Bronson Ing to advise.

## 2018-08-03 NOTE — Telephone Encounter (Signed)
Increase Lasix to 40 mg bid x 3 days, then 40 mg daily going forward. Double up on potassium when she is taking Lasix bid. She needs to see PCP as well (I know transportation has been an issue).

## 2018-08-03 NOTE — Telephone Encounter (Signed)
Called [place]. Informed her of Dr. Bronson Ing' s recommendations. She voiced understanding.

## 2018-08-05 ENCOUNTER — Telehealth: Payer: Self-pay | Admitting: Cardiovascular Disease

## 2018-08-05 ENCOUNTER — Other Ambulatory Visit: Payer: Self-pay | Admitting: Orthopedic Surgery

## 2018-08-05 DIAGNOSIS — I5032 Chronic diastolic (congestive) heart failure: Secondary | ICD-10-CM | POA: Diagnosis not present

## 2018-08-05 DIAGNOSIS — N183 Chronic kidney disease, stage 3 (moderate): Secondary | ICD-10-CM | POA: Diagnosis not present

## 2018-08-05 DIAGNOSIS — E1122 Type 2 diabetes mellitus with diabetic chronic kidney disease: Secondary | ICD-10-CM | POA: Diagnosis not present

## 2018-08-05 DIAGNOSIS — I482 Chronic atrial fibrillation, unspecified: Secondary | ICD-10-CM | POA: Diagnosis not present

## 2018-08-05 DIAGNOSIS — S72402D Unspecified fracture of lower end of left femur, subsequent encounter for closed fracture with routine healing: Secondary | ICD-10-CM | POA: Diagnosis not present

## 2018-08-05 DIAGNOSIS — S8251XD Displaced fracture of medial malleolus of right tibia, subsequent encounter for closed fracture with routine healing: Secondary | ICD-10-CM | POA: Diagnosis not present

## 2018-08-05 DIAGNOSIS — M810 Age-related osteoporosis without current pathological fracture: Secondary | ICD-10-CM | POA: Diagnosis not present

## 2018-08-05 DIAGNOSIS — S82832D Other fracture of upper and lower end of left fibula, subsequent encounter for closed fracture with routine healing: Secondary | ICD-10-CM | POA: Diagnosis not present

## 2018-08-05 DIAGNOSIS — I130 Hypertensive heart and chronic kidney disease with heart failure and stage 1 through stage 4 chronic kidney disease, or unspecified chronic kidney disease: Secondary | ICD-10-CM | POA: Diagnosis not present

## 2018-08-05 DIAGNOSIS — I513 Intracardiac thrombosis, not elsewhere classified: Secondary | ICD-10-CM | POA: Diagnosis not present

## 2018-08-05 DIAGNOSIS — F329 Major depressive disorder, single episode, unspecified: Secondary | ICD-10-CM | POA: Diagnosis not present

## 2018-08-05 DIAGNOSIS — S82192D Other fracture of upper end of left tibia, subsequent encounter for closed fracture with routine healing: Secondary | ICD-10-CM | POA: Diagnosis not present

## 2018-08-05 NOTE — Telephone Encounter (Signed)
Pt called requesting another round of antibiotics. She denies fever at this time. Pt does c/o productive cough with green mucus. She states that she is SOB and still has lower extremity swelling.  Pt asked if she had been seen by her PCP and she stated that " I'm looking for a new one." Please advise.

## 2018-08-05 NOTE — Telephone Encounter (Signed)
Hydrocodone-Acetaminophen 5/325 mg  Qty 28 Tablets  Take 1 tablet by mouth every 6 (six) hours as needed for up to 7 days for moderate pain.  PATIENT USES [name] [facility]

## 2018-08-05 NOTE — Telephone Encounter (Signed)
Pt called office stating she's not feeling well, having trouble breathing and she's having swelling. Called office requesting to be seen. Please call pt @ [phone]

## 2018-08-06 NOTE — Telephone Encounter (Signed)
Pt notified and stated that she has appt with PCP on Monday.

## 2018-08-06 NOTE — Telephone Encounter (Signed)
Probably start with an urgent care. Otherwise, could see if any APP availability at any office.

## 2018-08-09 ENCOUNTER — Other Ambulatory Visit: Payer: Self-pay | Admitting: Internal Medicine

## 2018-08-09 ENCOUNTER — Ambulatory Visit
Admission: RE | Admit: 2018-08-09 | Discharge: 2018-08-09 | Disposition: A | Discharge status: Home / Self Care | Payer: BLUE CROSS/BLUE SHIELD | Source: Ambulatory Visit | Attending: Internal Medicine | Admitting: Internal Medicine

## 2018-08-09 DIAGNOSIS — S82192D Other fracture of upper end of left tibia, subsequent encounter for closed fracture with routine healing: Secondary | ICD-10-CM | POA: Diagnosis not present

## 2018-08-09 DIAGNOSIS — I482 Chronic atrial fibrillation, unspecified: Secondary | ICD-10-CM | POA: Diagnosis not present

## 2018-08-09 DIAGNOSIS — I130 Hypertensive heart and chronic kidney disease with heart failure and stage 1 through stage 4 chronic kidney disease, or unspecified chronic kidney disease: Secondary | ICD-10-CM | POA: Diagnosis not present

## 2018-08-09 DIAGNOSIS — S7292XA Unspecified fracture of left femur, initial encounter for closed fracture: Secondary | ICD-10-CM | POA: Diagnosis not present

## 2018-08-09 DIAGNOSIS — S82832D Other fracture of upper and lower end of left fibula, subsequent encounter for closed fracture with routine healing: Secondary | ICD-10-CM | POA: Diagnosis not present

## 2018-08-09 DIAGNOSIS — R0609 Other forms of dyspnea: Secondary | ICD-10-CM | POA: Diagnosis not present

## 2018-08-09 DIAGNOSIS — M810 Age-related osteoporosis without current pathological fracture: Secondary | ICD-10-CM | POA: Diagnosis not present

## 2018-08-09 DIAGNOSIS — N183 Chronic kidney disease, stage 3 (moderate): Secondary | ICD-10-CM | POA: Diagnosis not present

## 2018-08-09 DIAGNOSIS — I10 Essential (primary) hypertension: Secondary | ICD-10-CM | POA: Diagnosis not present

## 2018-08-09 DIAGNOSIS — E1165 Type 2 diabetes mellitus with hyperglycemia: Secondary | ICD-10-CM | POA: Diagnosis not present

## 2018-08-09 DIAGNOSIS — I5032 Chronic diastolic (congestive) heart failure: Secondary | ICD-10-CM | POA: Diagnosis not present

## 2018-08-09 DIAGNOSIS — I513 Intracardiac thrombosis, not elsewhere classified: Secondary | ICD-10-CM | POA: Diagnosis not present

## 2018-08-09 DIAGNOSIS — J449 Chronic obstructive pulmonary disease, unspecified: Secondary | ICD-10-CM

## 2018-08-09 DIAGNOSIS — E1122 Type 2 diabetes mellitus with diabetic chronic kidney disease: Secondary | ICD-10-CM | POA: Diagnosis not present

## 2018-08-09 DIAGNOSIS — R05 Cough: Secondary | ICD-10-CM | POA: Diagnosis not present

## 2018-08-09 DIAGNOSIS — I5031 Acute diastolic (congestive) heart failure: Secondary | ICD-10-CM | POA: Diagnosis not present

## 2018-08-09 DIAGNOSIS — F329 Major depressive disorder, single episode, unspecified: Secondary | ICD-10-CM | POA: Diagnosis not present

## 2018-08-09 DIAGNOSIS — S72402D Unspecified fracture of lower end of left femur, subsequent encounter for closed fracture with routine healing: Secondary | ICD-10-CM | POA: Diagnosis not present

## 2018-08-09 DIAGNOSIS — S8251XD Displaced fracture of medial malleolus of right tibia, subsequent encounter for closed fracture with routine healing: Secondary | ICD-10-CM | POA: Diagnosis not present

## 2018-08-10 ENCOUNTER — Inpatient Hospital Stay (HOSPITAL_COMMUNITY)
Admission: EM | Admit: 2018-08-10 | Discharge: 2018-08-14 | DRG: 308 | Disposition: A | Discharge status: Home Health | Payer: BLUE CROSS/BLUE SHIELD | Attending: Internal Medicine | Admitting: Internal Medicine

## 2018-08-10 ENCOUNTER — Other Ambulatory Visit: Payer: Self-pay

## 2018-08-10 ENCOUNTER — Emergency Department (HOSPITAL_COMMUNITY): Payer: BLUE CROSS/BLUE SHIELD

## 2018-08-10 ENCOUNTER — Encounter (HOSPITAL_COMMUNITY): Payer: Self-pay | Admitting: Emergency Medicine

## 2018-08-10 DIAGNOSIS — R000 Tachycardia, unspecified: Secondary | ICD-10-CM | POA: Diagnosis not present

## 2018-08-10 DIAGNOSIS — J90 Pleural effusion, not elsewhere classified: Secondary | ICD-10-CM

## 2018-08-10 DIAGNOSIS — Z7951 Long term (current) use of inhaled steroids: Secondary | ICD-10-CM

## 2018-08-10 DIAGNOSIS — F329 Major depressive disorder, single episode, unspecified: Secondary | ICD-10-CM | POA: Diagnosis present

## 2018-08-10 DIAGNOSIS — F419 Anxiety disorder, unspecified: Secondary | ICD-10-CM | POA: Diagnosis present

## 2018-08-10 DIAGNOSIS — J441 Chronic obstructive pulmonary disease with (acute) exacerbation: Secondary | ICD-10-CM | POA: Diagnosis present

## 2018-08-10 DIAGNOSIS — Z7984 Long term (current) use of oral hypoglycemic drugs: Secondary | ICD-10-CM | POA: Diagnosis not present

## 2018-08-10 DIAGNOSIS — Z91040 Latex allergy status: Secondary | ICD-10-CM | POA: Diagnosis not present

## 2018-08-10 DIAGNOSIS — E1165 Type 2 diabetes mellitus with hyperglycemia: Secondary | ICD-10-CM | POA: Diagnosis not present

## 2018-08-10 DIAGNOSIS — Z7901 Long term (current) use of anticoagulants: Secondary | ICD-10-CM

## 2018-08-10 DIAGNOSIS — J449 Chronic obstructive pulmonary disease, unspecified: Secondary | ICD-10-CM | POA: Diagnosis not present

## 2018-08-10 DIAGNOSIS — N390 Urinary tract infection, site not specified: Secondary | ICD-10-CM | POA: Diagnosis present

## 2018-08-10 DIAGNOSIS — B9620 Unspecified Escherichia coli [E. coli] as the cause of diseases classified elsewhere: Secondary | ICD-10-CM | POA: Diagnosis present

## 2018-08-10 DIAGNOSIS — I10 Essential (primary) hypertension: Secondary | ICD-10-CM | POA: Diagnosis not present

## 2018-08-10 DIAGNOSIS — E0789 Other specified disorders of thyroid: Secondary | ICD-10-CM | POA: Diagnosis present

## 2018-08-10 DIAGNOSIS — Z6841 Body Mass Index (BMI) 40.0 and over, adult: Secondary | ICD-10-CM

## 2018-08-10 DIAGNOSIS — B9689 Other specified bacterial agents as the cause of diseases classified elsewhere: Secondary | ICD-10-CM | POA: Diagnosis not present

## 2018-08-10 DIAGNOSIS — R0602 Shortness of breath: Secondary | ICD-10-CM

## 2018-08-10 DIAGNOSIS — M810 Age-related osteoporosis without current pathological fracture: Secondary | ICD-10-CM | POA: Diagnosis present

## 2018-08-10 DIAGNOSIS — IMO0002 Reserved for concepts with insufficient information to code with codable children: Secondary | ICD-10-CM | POA: Diagnosis present

## 2018-08-10 DIAGNOSIS — R457 State of emotional shock and stress, unspecified: Secondary | ICD-10-CM | POA: Diagnosis not present

## 2018-08-10 DIAGNOSIS — E079 Disorder of thyroid, unspecified: Secondary | ICD-10-CM | POA: Diagnosis present

## 2018-08-10 DIAGNOSIS — I110 Hypertensive heart disease with heart failure: Secondary | ICD-10-CM | POA: Diagnosis present

## 2018-08-10 DIAGNOSIS — E876 Hypokalemia: Secondary | ICD-10-CM | POA: Diagnosis not present

## 2018-08-10 DIAGNOSIS — I5033 Acute on chronic diastolic (congestive) heart failure: Secondary | ICD-10-CM | POA: Diagnosis not present

## 2018-08-10 DIAGNOSIS — Z79899 Other long term (current) drug therapy: Secondary | ICD-10-CM

## 2018-08-10 DIAGNOSIS — I2510 Atherosclerotic heart disease of native coronary artery without angina pectoris: Secondary | ICD-10-CM | POA: Diagnosis present

## 2018-08-10 DIAGNOSIS — I4891 Unspecified atrial fibrillation: Secondary | ICD-10-CM | POA: Diagnosis not present

## 2018-08-10 HISTORY — DX: Heart failure, unspecified: I50.9

## 2018-08-10 HISTORY — DX: Personal history of other medical treatment: Z92.89

## 2018-08-10 HISTORY — DX: Unspecified atrial fibrillation: I48.91

## 2018-08-10 LAB — CBC WITH DIFFERENTIAL/PLATELET
Abs Immature Granulocytes: 0.03 10*3/uL (ref 0.00–0.07)
Basophils Absolute: 0.1 10*3/uL (ref 0.0–0.1)
Basophils Relative: 1 %
Eosinophils Absolute: 0.3 10*3/uL (ref 0.0–0.5)
Eosinophils Relative: 5 %
HCT: 30.4 % — ABNORMAL LOW (ref 36.0–46.0)
Hemoglobin: 9.1 g/dL — ABNORMAL LOW (ref 12.0–15.0)
IMMATURE GRANULOCYTES: 0 %
Lymphocytes Relative: 13 %
Lymphs Abs: 0.9 10*3/uL (ref 0.7–4.0)
MCH: 26.8 pg (ref 26.0–34.0)
MCHC: 29.9 g/dL — ABNORMAL LOW (ref 30.0–36.0)
MCV: 89.4 fL (ref 80.0–100.0)
Monocytes Absolute: 0.7 10*3/uL (ref 0.1–1.0)
Monocytes Relative: 9 %
Neutro Abs: 5.4 10*3/uL (ref 1.7–7.7)
Neutrophils Relative %: 72 %
PLATELETS: 274 10*3/uL (ref 150–400)
RBC: 3.4 MIL/uL — ABNORMAL LOW (ref 3.87–5.11)
RDW: 17.8 % — AB (ref 11.5–15.5)
WBC: 7.5 10*3/uL (ref 4.0–10.5)
nRBC: 0 % (ref 0.0–0.2)

## 2018-08-10 LAB — I-STAT CHEM 8, ED
BUN: 14 mg/dL (ref 8–23)
CHLORIDE: 104 mmol/L (ref 98–111)
Calcium, Ion: 0.98 mmol/L — ABNORMAL LOW (ref 1.15–1.40)
Creatinine, Ser: 0.8 mg/dL (ref 0.44–1.00)
Glucose, Bld: 229 mg/dL — ABNORMAL HIGH (ref 70–99)
HCT: 28 % — ABNORMAL LOW (ref 36.0–46.0)
Hemoglobin: 9.5 g/dL — ABNORMAL LOW (ref 12.0–15.0)
Potassium: 3.4 mmol/L — ABNORMAL LOW (ref 3.5–5.1)
Sodium: 139 mmol/L (ref 135–145)
TCO2: 25 mmol/L (ref 22–32)

## 2018-08-10 LAB — I-STAT CG4 LACTIC ACID, ED: Lactic Acid, Venous: 1.19 mmol/L (ref 0.5–1.9)

## 2018-08-10 LAB — COMPREHENSIVE METABOLIC PANEL
ALT: 9 U/L (ref 0–44)
AST: 14 U/L — ABNORMAL LOW (ref 15–41)
Albumin: 3 g/dL — ABNORMAL LOW (ref 3.5–5.0)
Alkaline Phosphatase: 67 U/L (ref 38–126)
Anion gap: 12 (ref 5–15)
BUN: 16 mg/dL (ref 8–23)
CO2: 21 mmol/L — ABNORMAL LOW (ref 22–32)
Calcium: 7.6 mg/dL — ABNORMAL LOW (ref 8.9–10.3)
Chloride: 105 mmol/L (ref 98–111)
Creatinine, Ser: 0.85 mg/dL (ref 0.44–1.00)
GFR calc Af Amer: >60 mL/min (ref >60)
GFR calc non Af Amer: >60 mL/min (ref >60)
GLUCOSE: 231 mg/dL — AB (ref 70–99)
Potassium: 3.4 mmol/L — ABNORMAL LOW (ref 3.5–5.1)
Sodium: 138 mmol/L (ref 135–145)
Total Bilirubin: 1 mg/dL (ref 0.3–1.2)
Total Protein: 6.6 g/dL (ref 6.5–8.1)

## 2018-08-10 LAB — TROPONIN I
Troponin I: <0.03 ng/mL (ref <0.03)
Troponin I: <0.03 ng/mL (ref <0.03)
Troponin I: <0.03 ng/mL (ref <0.03)

## 2018-08-10 LAB — URINALYSIS, ROUTINE W REFLEX MICROSCOPIC
Bilirubin Urine: NEGATIVE
GLUCOSE, UA: NEGATIVE mg/dL
Hgb urine dipstick: NEGATIVE
Ketones, ur: NEGATIVE mg/dL
Nitrite: POSITIVE — AB
Protein, ur: NEGATIVE mg/dL
Specific Gravity, Urine: 1.012 (ref 1.005–1.030)
pH: 5 (ref 5.0–8.0)

## 2018-08-10 LAB — GLUCOSE, CAPILLARY
GLUCOSE-CAPILLARY: 168 mg/dL — AB (ref 70–99)
Glucose-Capillary: 182 mg/dL — ABNORMAL HIGH (ref 70–99)

## 2018-08-10 LAB — MAGNESIUM: Magnesium: 1.1 mg/dL — ABNORMAL LOW (ref 1.7–2.4)

## 2018-08-10 LAB — BRAIN NATRIURETIC PEPTIDE: B Natriuretic Peptide: 247 pg/mL — ABNORMAL HIGH (ref 0.0–100.0)

## 2018-08-10 MED ORDER — FLUTICASONE PROPIONATE 50 MCG/ACT NA SUSP
2.0000 | Freq: Every day | NASAL | Status: DC
  Administered 2018-08-10 – 2018-08-14 (×5): 2 via NASAL
  Filled 2018-08-10: qty 16

## 2018-08-10 MED ORDER — SODIUM CHLORIDE 0.9% FLUSH: 3.0000 mL | INTRAVENOUS | Status: DC | PRN

## 2018-08-10 MED ORDER — HYDROXYZINE HCL 25 MG PO TABS
25.0000 mg | ORAL_TABLET | Freq: Three times a day (TID) | ORAL | Status: DC | PRN
  Administered 2018-08-10 – 2018-08-11 (×2): 25 mg via ORAL
  Filled 2018-08-10 (×2): qty 1

## 2018-08-10 MED ORDER — KETOROLAC TROMETHAMINE 15 MG/ML IJ SOLN
15.0000 mg | Freq: Four times a day (QID) | INTRAMUSCULAR | Status: DC | PRN
  Administered 2018-08-10 – 2018-08-13 (×8): 15 mg via INTRAVENOUS
  Filled 2018-08-10 (×8): qty 1

## 2018-08-10 MED ORDER — ONDANSETRON HCL 4 MG/2ML IJ SOLN: 4.0000 mg | Freq: Four times a day (QID) | INTRAMUSCULAR | Status: DC | PRN

## 2018-08-10 MED ORDER — SODIUM CHLORIDE 0.9% FLUSH
3.0000 mL | Freq: Two times a day (BID) | INTRAVENOUS | Status: DC
  Administered 2018-08-10 – 2018-08-14 (×8): 3 mL via INTRAVENOUS

## 2018-08-10 MED ORDER — BUPROPION HCL ER (XL) 150 MG PO TB24
300.0000 mg | ORAL_TABLET | Freq: Every day | ORAL | Status: DC
  Administered 2018-08-10 – 2018-08-14 (×5): 300 mg via ORAL
  Filled 2018-08-10 (×5): qty 2

## 2018-08-10 MED ORDER — TIOTROPIUM BROMIDE MONOHYDRATE 1.25 MCG/ACT IN AERS: 2.0000 | INHALATION_SPRAY | Freq: Every day | RESPIRATORY_TRACT | Status: DC

## 2018-08-10 MED ORDER — ACETAMINOPHEN 325 MG PO TABS
650.0000 mg | ORAL_TABLET | ORAL | Status: DC | PRN
  Administered 2018-08-11 – 2018-08-12 (×2): 650 mg via ORAL
  Filled 2018-08-10 (×2): qty 2

## 2018-08-10 MED ORDER — IOPAMIDOL (ISOVUE-370) INJECTION 76%
100.0000 mL | Freq: Once | INTRAVENOUS | Status: AC | PRN
  Administered 2018-08-10: 100 mL via INTRAVENOUS

## 2018-08-10 MED ORDER — POTASSIUM CHLORIDE CRYS ER 20 MEQ PO TBCR
40.0000 meq | EXTENDED_RELEASE_TABLET | Freq: Once | ORAL | Status: AC
  Administered 2018-08-10: 40 meq via ORAL
  Filled 2018-08-10: qty 2

## 2018-08-10 MED ORDER — LEVOFLOXACIN 750 MG PO TABS
750.0000 mg | ORAL_TABLET | Freq: Every day | ORAL | Status: DC
  Administered 2018-08-10 – 2018-08-13 (×4): 750 mg via ORAL
  Filled 2018-08-10 (×4): qty 1

## 2018-08-10 MED ORDER — DILTIAZEM HCL 60 MG PO TABS
60.0000 mg | ORAL_TABLET | Freq: Four times a day (QID) | ORAL | Status: DC
  Administered 2018-08-10 – 2018-08-12 (×6): 60 mg via ORAL
  Filled 2018-08-10 (×6): qty 1

## 2018-08-10 MED ORDER — INSULIN ASPART 100 UNIT/ML ~~LOC~~ SOLN
0.0000 [IU] | Freq: Every day | SUBCUTANEOUS | Status: DC
  Administered 2018-08-12: 2 [IU] via SUBCUTANEOUS
  Administered 2018-08-13: 4 [IU] via SUBCUTANEOUS

## 2018-08-10 MED ORDER — SODIUM CHLORIDE 0.9 % IV SOLN
1.0000 g | Freq: Once | INTRAVENOUS | Status: AC
  Administered 2018-08-10: 1 g via INTRAVENOUS
  Filled 2018-08-10: qty 10

## 2018-08-10 MED ORDER — LORATADINE 10 MG PO TABS
10.0000 mg | ORAL_TABLET | Freq: Every day | ORAL | Status: DC
  Administered 2018-08-10 – 2018-08-14 (×5): 10 mg via ORAL
  Filled 2018-08-10 (×5): qty 1

## 2018-08-10 MED ORDER — MAGNESIUM SULFATE 2 GM/50ML IV SOLN
2.0000 g | Freq: Once | INTRAVENOUS | Status: AC
  Administered 2018-08-10: 2 g via INTRAVENOUS
  Filled 2018-08-10: qty 50

## 2018-08-10 MED ORDER — APIXABAN 5 MG PO TABS
5.0000 mg | ORAL_TABLET | Freq: Two times a day (BID) | ORAL | Status: DC
  Administered 2018-08-10 – 2018-08-14 (×8): 5 mg via ORAL
  Filled 2018-08-10 (×8): qty 1

## 2018-08-10 MED ORDER — DILTIAZEM HCL-DEXTROSE 100-5 MG/100ML-% IV SOLN (PREMIX)
5.0000 mg/h | INTRAVENOUS | Status: DC
  Administered 2018-08-10: 15 mg/h via INTRAVENOUS
  Administered 2018-08-10: 10 mg/h via INTRAVENOUS
  Administered 2018-08-10: 5 mg/h via INTRAVENOUS
  Administered 2018-08-10: 15 mg/h via INTRAVENOUS
  Administered 2018-08-11: 10 mg/h via INTRAVENOUS
  Filled 2018-08-10 (×3): qty 100

## 2018-08-10 MED ORDER — SODIUM CHLORIDE 0.9 % IV SOLN: 250.0000 mL | INTRAVENOUS | Status: DC | PRN

## 2018-08-10 MED ORDER — POTASSIUM CHLORIDE CRYS ER 20 MEQ PO TBCR
20.0000 meq | EXTENDED_RELEASE_TABLET | Freq: Every day | ORAL | Status: DC
  Administered 2018-08-11 – 2018-08-14 (×4): 20 meq via ORAL
  Filled 2018-08-10 (×4): qty 1

## 2018-08-10 MED ORDER — UMECLIDINIUM BROMIDE 62.5 MCG/INH IN AEPB
1.0000 | INHALATION_SPRAY | Freq: Every day | RESPIRATORY_TRACT | Status: DC
  Administered 2018-08-11 – 2018-08-14 (×4): 1 via RESPIRATORY_TRACT
  Filled 2018-08-10: qty 7

## 2018-08-10 MED ORDER — INSULIN ASPART 100 UNIT/ML ~~LOC~~ SOLN
0.0000 [IU] | Freq: Three times a day (TID) | SUBCUTANEOUS | Status: DC
  Administered 2018-08-10: 3 [IU] via SUBCUTANEOUS
  Administered 2018-08-11 (×2): 2 [IU] via SUBCUTANEOUS
  Administered 2018-08-12 (×3): 3 [IU] via SUBCUTANEOUS
  Administered 2018-08-13: 11 [IU] via SUBCUTANEOUS
  Administered 2018-08-13: 15 [IU] via SUBCUTANEOUS
  Administered 2018-08-13 – 2018-08-14 (×3): 8 [IU] via SUBCUTANEOUS

## 2018-08-10 MED ORDER — DOCUSATE SODIUM 100 MG PO CAPS: 100.0000 mg | ORAL_CAPSULE | Freq: Two times a day (BID) | ORAL | Status: DC | PRN

## 2018-08-10 MED ORDER — DILTIAZEM LOAD VIA INFUSION
10.0000 mg | Freq: Once | INTRAVENOUS | Status: AC
  Administered 2018-08-10: 10 mg via INTRAVENOUS
  Filled 2018-08-10: qty 10

## 2018-08-10 MED ORDER — FUROSEMIDE 10 MG/ML IJ SOLN
40.0000 mg | Freq: Two times a day (BID) | INTRAMUSCULAR | Status: DC
  Administered 2018-08-10 – 2018-08-13 (×7): 40 mg via INTRAVENOUS
  Filled 2018-08-10 (×7): qty 4

## 2018-08-10 MED ORDER — LEVALBUTEROL HCL 0.63 MG/3ML IN NEBU: 0.6300 mg | INHALATION_SOLUTION | Freq: Four times a day (QID) | RESPIRATORY_TRACT | Status: DC | PRN

## 2018-08-10 NOTE — ED Notes (Signed)
Pharmacy to bring med K+.

## 2018-08-10 NOTE — ED Triage Notes (Signed)
C/o SOB for 3 or 4 months and has increased in last 3 days.  Seen by PCP yesterday and was told that this was anxiety.Was suppose to be started on Lexapro but medication was not called in.  Denies pain but ,"I feel something in my heart."  Pt says she wore a heart monitor about10 days but have not hear back from [facility].

## 2018-08-10 NOTE — Progress Notes (Signed)
As per report from the previous nurse the plan to gradually decrease the patient's Cardizem drip now that she has started PO Cardizem. -the patient's HR is yet still labile ranging from the low 100's to the low 130's. -this RN will attempt to decrease the patient's Cardizem drip after administration of the MN dose of Cardizem.

## 2018-08-10 NOTE — H&P (Addendum)
History and Physical    [name] OIN:[id number] DOB: Mar 10, 1951 DOA: 08/10/2018  PCP: [name], MD  Patient coming from: Home  I have personally briefly reviewed patient's old medical records in [place]  Chief Complaint: Shortness of breath  HPI: [place] [name] is a 67 y.o. female with medical history significant of atrial fibrillation, diastolic heart failure.  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  Patient reports over the past few months, she has been increasingly short of breath.  Her symptoms have progressively gotten worse in the past week.  She denies any chest pain, but has had episodes of palpitations.  She has a chronic cough.  Shortness of breath is worse on exertion.  She also describes orthopnea and paroxysmal nocturnal dyspnea.  She is also noticed edema in her lower extremities.  She says that her weight has been relatively steady, but this may be inaccurate since she is been unable to stand on a scale.  She recently broke both of her legs, and only recently which she relates by her orthopedic physician to begin physical therapy with weightbearing as tolerated.  She is not had a fever.  She was also recently treated for a pneumonia and completed a course of azithromycin.  ED Course: She was noted to be in rapid atrial fibrillation.  Chest x-ray indicated evidence of CHF.  CT angio chest did not show any evidence of PE.  Basic labs are relatively unrevealing.  For rapid A. fib, she was started on a Cardizem infusion.  She is been referred for admission  Review of Systems: As per HPI otherwise 10 point review of systems negative.    Past Medical History:  Diagnosis Date  . Atrial fibrillation ([place])   . CHF (congestive heart failure) ([place])   . Depression   . Diabetes mellitus without complication ([place])   . 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 Hypertension   . Morbid obesity ([place])   .  Osteoporosis   . UTI (lower urinary tract infection) 01/2016   Cipro for Klebsiella pneumoniae isolate    Past Surgical History:  Procedure Laterality Date  . ABDOMINAL HYSTERECTOMY    . CESAREAN SECTION    . CHOLECYSTECTOMY    . FEMUR IM NAIL Left 02/20/2016  . FEMUR IM NAIL Left 02/20/2016   Procedure: INTRAMEDULLARY (IM) RETROGRADE FEMORAL NAILING;  Surgeon: Leandrew Koyanagi, MD;  Location: [place];  Service: Orthopedics;  Laterality: Left;  . PANCREAS SURGERY  1967   1 cyst excised and one cyst drained  . TEE WITHOUT CARDIOVERSION N/A 04/05/2018   Procedure: TRANSESOPHAGEAL ECHOCARDIOGRAM (TEE);  Surgeon: Dorothy Spark, MD;  Location: [place];  Service: Cardiovascular;  Laterality: N/A;  . [place]    . WRIST FRACTURE SURGERY      Social History:  reports that she has never smoked. She has never used smokeless tobacco. She reports that she does not drink alcohol or use drugs.  Allergies  Allergen Reactions  . Latex Itching and Rash    Family History  Problem Relation Age of Onset  . Diabetes Mother        died in her 17's of a stroke  . Cancer Mother   . Stroke Mother   . Breast cancer Mother   . Diabetes Father        died in his 5's of a stroke.  . Stroke Father   . Diabetes Brother  died @ 36 of a stroke.  . Stroke Brother   . Diabetes Maternal Grandmother   . Diabetes Maternal Grandfather   . Diabetes Paternal Grandmother   . Diabetes Paternal Grandfather   . Breast cancer Maternal Aunt     Prior to Admission medications   Medication Sig Start Date End Date Taking? Authorizing Provider  acetaminophen (TYLENOL) 500 MG tablet Take 1,500-2,000 mg by mouth every 6 (six) hours as needed for mild pain or headache.   Yes [provider]  apixaban (ELIQUIS) 5 MG TABS tablet Take 1 tablet (5 mg total) by mouth 2 (two) times daily. 07/21/18  Yes Herminio Commons, MD  buPROPion (WELLBUTRIN XL) 300 MG 24 hr tablet Take 300 mg by  mouth daily. 07/06/13  Yes [provider]  diltiazem (CARDIZEM CD) 120 MG 24 hr capsule Take 1 capsule (120 mg total) by mouth daily. 07/21/18  Yes Herminio Commons, MD  docusate sodium (COLACE) 100 MG capsule Take 100 mg by mouth 2 (two) times daily as needed for mild constipation.   Yes [provider]  fluticasone (FLONASE) 50 MCG/ACT nasal spray Place 2 sprays into both nostrils daily.   Yes [provider]  furosemide (LASIX) 20 MG tablet Take 1 tablet (20 mg total) by mouth daily. 07/21/18  Yes Herminio Commons, MD  glipiZIDE (GLUCOTROL) 10 MG tablet Take 10 mg by mouth 2 (two) times daily. 03/02/18  Yes [provider]  loratadine (CLARITIN) 10 MG tablet Take 10 mg by mouth daily.   Yes [provider]  pioglitazone (ACTOS) 45 MG tablet Take 45 mg by mouth daily. 05/17/18  Yes [provider]  potassium chloride SA (K-DUR,KLOR-CON) 20 MEQ tablet Take 1 tablet (20 mEq total) by mouth daily. 07/21/18  Yes Herminio Commons, MD  SPIRIVA RESPIMAT 1.25 MCG/ACT AERS Inhale 2 sprays into the lungs daily. 01/21/18  Yes [provider]    Physical Exam: Vitals:   08/10/18 1530 08/10/18 1545 08/10/18 1550 08/10/18 1614  BP: 127/70 (!) 101/52    Pulse:      Resp: 18 19    Temp:    98 F (36.7 C)  TempSrc:    Oral  SpO2: 98%  100%   Weight:      Height:        Constitutional: NAD, calm, comfortable Eyes: PERRL, lids and conjunctivae normal ENMT: Mucous membranes are moist. Posterior pharynx clear of any exudate or lesions.Normal dentition.  Neck: normal, supple, no masses, no thyromegaly Respiratory: crackles at bases. Normal respiratory effort. No accessory muscle use.  Cardiovascular: Irregular rate and rhythm, no murmurs / rubs / gallops.  1+ extremity edema. 2+ pedal pulses. No carotid bruits.  Abdomen: no tenderness, no masses palpated. No hepatosplenomegaly. Bowel sounds positive.  Musculoskeletal: no clubbing /  cyanosis. No joint deformity upper and lower extremities. Good ROM, no contractures. Normal muscle tone.  Skin: no rashes, lesions, ulcers. No induration Neurologic: CN 2-12 grossly intact. Sensation intact, DTR normal. Strength 5/5 in all 4.  Psychiatric: Normal judgment and insight. Alert and oriented x 3. Normal mood.    Labs on Admission: I have personally reviewed following labs and imaging studies  CBC: Recent Labs  Lab 08/10/18 0743 08/10/18 0755  WBC 7.5  --   NEUTROABS 5.4  --   HGB 9.1* 9.5*  HCT 30.4* 28.0*  MCV 89.4  --   PLT 274  --    Basic Metabolic Panel: Recent Labs  Lab  08/10/18 0743 08/10/18 0747 08/10/18 0755  NA  --  138 139  K  --  3.4* 3.4*  CL  --  105 104  CO2  --  21*  --   GLUCOSE  --  231* 229*  BUN  --  16 14  CREATININE  --  0.85 0.80  CALCIUM  --  7.6*  --   MG 1.1*  --   --    GFR: Estimated Creatinine Clearance: 82.2 mL/min (by C-G formula based on SCr of 0.8 mg/dL). Liver Function Tests: Recent Labs  Lab 08/10/18 0747  AST 14*  ALT 9  ALKPHOS 67  BILITOT 1.0  PROT 6.6  ALBUMIN 3.0*   No results for input(s): LIPASE, AMYLASE in the last 168 hours. No results for input(s): AMMONIA in the last 168 hours. Coagulation Profile: No results for input(s): INR, PROTIME in the last 168 hours. Cardiac Enzymes: Recent Labs  Lab 08/10/18 0743  TROPONINI <0.03   BNP (last 3 results) No results for input(s): PROBNP in the last 8760 hours. HbA1C: No results for input(s): HGBA1C in the last 72 hours. CBG: Recent Labs  Lab 08/10/18 1613  GLUCAP 182*   Lipid Profile: No results for input(s): CHOL, HDL, LDLCALC, TRIG, CHOLHDL, LDLDIRECT in the last 72 hours. Thyroid Function Tests: No results for input(s): TSH, T4TOTAL, FREET4, T3FREE, THYROIDAB in the last 72 hours. Anemia Panel: No results for input(s): VITAMINB12, FOLATE, FERRITIN, TIBC, IRON, RETICCTPCT in the last 72 hours. Urine analysis:    Component Value Date/Time    COLORURINE YELLOW 08/10/2018 [place] 08/10/2018 0814   LABSPEC 1.012 08/10/2018 0814   PHURINE 5.0 08/10/2018 0814   GLUCOSEU NEGATIVE 08/10/2018 0814   HGBUR NEGATIVE 08/10/2018 0814   BILIRUBINUR NEGATIVE 08/10/2018 0814   KETONESUR NEGATIVE 08/10/2018 0814   PROTEINUR NEGATIVE 08/10/2018 0814   UROBILINOGEN 0.2 12/01/2007 1025   NITRITE POSITIVE (A) 08/10/2018 0814   LEUKOCYTESUR SMALL (A) 08/10/2018 0814    Radiological Exams on Admission: Dg Chest 2 View  Result Date: 08/09/2018 CLINICAL DATA:  Persistent chest pressure, shortness of breath, and productive cough. EXAM: CHEST - 2 VIEW COMPARISON:  Chest x-ray dated July 26, 2018. FINDINGS: Stable cardiomediastinal silhouette. Normal pulmonary vascularity. Layering small right pleural effusion has decreased in size. Improved aeration of the right lower lobe. The left lung is clear. No pneumothorax. No acute osseous abnormality. IMPRESSION: 1. Decreased small right pleural effusion with improving but persistent right lower lobe atelectasis versus infiltrate. Electronically Signed   By: Titus Dubin M.D.   On: 08/09/2018 12:22   Ct Angio Chest Pe W/cm &/or Wo Cm  Result Date: 08/10/2018 CLINICAL DATA:  Shortness of breath EXAM: CT ANGIOGRAPHY CHEST WITH CONTRAST TECHNIQUE: Multidetector CT imaging of the chest was performed using the standard protocol during bolus administration of intravenous contrast. Multiplanar CT image reconstructions and MIPs were obtained to evaluate the vascular anatomy. CONTRAST:  167m ISOVUE-370 IOPAMIDOL (ISOVUE-370) INJECTION 76% COMPARISON:  Chest CT July 28, 2013; chest radiograph August 10, 2018 FINDINGS: Cardiovascular: There is no demonstrable pulmonary embolus. There is no thoracic aortic aneurysm or dissection. The visualized great vessels appear unremarkable. There is aortic atherosclerosis. There are foci of coronary artery calcification. There is no pericardial effusion or  pericardial thickening. Mediastinum/Nodes: There is a mass arising in the right lobe of the thyroid measuring 2.0 x 0.9 cm. There are subcentimeter mediastinal lymph nodes. There is no adenopathy by size criteria in the thoracic region. No  esophageal lesions are evident. Lungs/Pleura: There is a sizable free-flowing pleural effusion on the right. There is consolidation throughout much of the right lower lobe. There is atelectatic change in a portion of the posterior segment right upper lobe. There is atelectatic change in the left base. There is mild lower lobe bronchiectatic change on the left. Upper Abdomen: Gallbladder is absent. There is abdominal aortic and splenic artery atherosclerosis. Visualized upper abdominal structures otherwise appear unremarkable. Musculoskeletal: There is degenerative change in the thoracic spine. There is anterior wedging of the T6 vertebral body, also present on previous study. There are no blastic or lytic bone lesions. No evident chest wall lesions. Review of the MIP images confirms the above findings. IMPRESSION: 1. No demonstrable pulmonary embolus. No thoracic aortic aneurysm or dissection. There is aortic atherosclerosis. There are foci of coronary artery calcification. 2. Sizable free-flowing right pleural effusion with consolidation throughout much of the right lower lobe. There is atelectatic change in the posterior segment of the right upper lobe. 3. Atelectatic change left base. Mild bronchiectasis left lower lobe. 4. **An incidental finding of potential clinical significance has been found. 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 Consider further evaluation with thyroid ultrasound nonemergently. If patient is clinically hyperthyroid, consider nuclear medicine thyroid uptake and scan.** 5.  No evident thoracic adenopathy. 6.  Gallbladder absent. Aortic Atherosclerosis (ICD10-I70.0). Electronically Signed   By: Lowella Grip III M.D.   On: 08/10/2018  11:18   Dg Chest Port 1 View  Result Date: 08/10/2018 CLINICAL DATA:  Shortness of breath, palpitations EXAM: PORTABLE CHEST 1 VIEW COMPARISON:  08/09/2018 FINDINGS: Cardiomegaly with vascular congestion. Right lower lobe airspace opacity with probable layering right effusion. No confluent opacity on the left. No acute bony abnormality. IMPRESSION: Cardiomegaly with vascular congestion. Layering right effusion with right lower lobe atelectasis or infiltrate, unchanged. Electronically Signed   By: Rolm Baptise M.D.   On: 08/10/2018 08:17    EKG: Independently reviewed.  Atrial fibrillation  Assessment/Plan Active Problems:   Diabetes type 2, uncontrolled (HCC)   HTN (hypertension)   Atrial fibrillation with RVR (HCC)   SOB (shortness of breath)   Acute on chronic diastolic CHF (congestive heart failure) (HCC)   Acute lower UTI   Hypokalemia   COPD (chronic obstructive pulmonary disease) ([place])     1. Acute on chronic diastolic congestive heart failure.  Possibly precipitated by uncontrolled A. fib.  Continue on IV Lasix.  Recent echocardiogram was done in 03/2018 which showed normal ejection fraction.  Monitor intake and output. 2. Atrial fibrillation with rapid ventricular response.  Currently on intravenous Cardizem.  She was previously taking oral metoprolol and Cardizem.  Her cardiologist has discontinued her metoprolol and efforts to consolidate her medications to Cardizem.  Will start on oral Cardizem and may need to add metoprolol if rate is proving difficult to control.  She is anticoagulated with apixaban. 3. History of LV thrombus.  Continue anticoagulation with apixaban. 4. Possible urinary tract infection.  Urinalysis indicates possible infection.  Will start the patient on Levaquin.  Follow-up urine culture. 5. COPD.  Continue antibiotics, dilators.  No wheezing at this time.  Will hold off on steroids. 6. Diabetes.  Start on sliding scale insulin.  Hold oral  agents. 7. Hypertension.  Blood pressures currently stable.  Continue on Cardizem. 8. Hypokalemia.  Potassium was mildly low and magnesium was 1.1.  These are being replaced. 9. Thyroid lesion.  Incidental finding of right lobe thyroid lesion on  CT angios.  This can be further worked up/followed as an outpatient.  DVT prophylaxis: Apixaban Code Status: Full code Family Communication: No family present Disposition Plan: Discharge home once improved Consults called:   Admission status: Inpatient, stepdown  Critical care time spent: 50 minutes  Kathie Dike MD [facility] Pager [phone]  If 7PM-7AM, please contact night-coverage [email or web address] Password [facility]  08/10/2018, 4:36 PM

## 2018-08-10 NOTE — ED Provider Notes (Signed)
[facility] EMERGENCY DEPARTMENT Provider Note   CSN: [id number] Arrival date & time: 08/10/18  0708     History   Chief Complaint Chief Complaint  Patient presents with  . Shortness of Breath    HPI [name] is a 67 y.o. female.  HPI  Pt was seen at [facility]. Per pt, c/o gradual onset and worsening of persistent SOB for the past 3 to 4 months, worse over the past 1 week. Has been associated with intermittent palpitations, described as "fast HR." Pt states she has been evaluated by her Cards MD with a Holter monitor (does not know results) as well as her PMD (most recently as yesterday). Pt states at one point she was dx pneumonia and took a course of antibiotics without improvement. States her PMD yesterday told her she "had anxiety" and prescribed lexapro. Endorses hx afib and CHF; states she has been compliant with her meds. Pt states she has not weighed herself because she has been "non-weight bearing because I broke both my legs." Denies CP, no cough, no abd pain, no N/V/D, no back pain, no fevers, no calf/LE pain or unilateral swelling.    Past Medical History:  Diagnosis Date  . Atrial fibrillation ([place])   . CHF (congestive heart failure) ([place])   . Depression   . Diabetes mellitus without complication ([place])   . Hypertension   . Morbid obesity ([place])   . Osteoporosis   . UTI (lower urinary tract infection) 01/2016   Cipro for Klebsiella pneumoniae isolate    Patient Active Problem List   Diagnosis Date Noted  . Closed fracture of right ankle 06/10/2018  . Closed fracture of left tibia and fibula with routine healing 04/13/18 06/10/2018  . Unspecified atrial fibrillation ([place]) 04/15/2018  . Chronic diastolic CHF (congestive heart failure) ([place]) 04/15/2018  . CKD (chronic kidney disease), stage III ([place]) 04/15/2018  . Closed right fibular fracture 04/15/2018  . Left tibial fracture 04/14/2018  . CHF exacerbation ([place]) 04/10/2018  . SOB (shortness of breath)   .  Acute CHF (congestive heart failure) ([place]) 04/01/2018  . Atrial fibrillation with RVR ([place]) 04/01/2018  . [place] 03/11/2016  . Muscle spasms of lower extremity 03/04/2016  . Acute renal insufficiency 02/26/2016  . History of MRSA infection 02/26/2016  . Diabetes type 2, uncontrolled ([place]) 02/19/2016  . HTN (hypertension), benign 02/19/2016  . Fracture, femur, distal ([place]) 02/19/2016  . Fall 02/19/2016  . Depression 02/19/2016  . Closed left femoral fracture, initial encounter 02/19/2016  . Femur fracture, left ([place]) 02/19/2016    Past Surgical History:  Procedure Laterality Date  . ABDOMINAL HYSTERECTOMY    . CESAREAN SECTION    . CHOLECYSTECTOMY    . FEMUR IM NAIL Left 02/20/2016  . FEMUR IM NAIL Left 02/20/2016   Procedure: INTRAMEDULLARY (IM) RETROGRADE FEMORAL NAILING;  Surgeon: Leandrew Koyanagi, MD;  Location: [facility];  Service: Orthopedics;  Laterality: Left;  . PANCREAS SURGERY  1967   1 cyst excised and one cyst drained  . TEE WITHOUT CARDIOVERSION N/A 04/05/2018   Procedure: TRANSESOPHAGEAL ECHOCARDIOGRAM (TEE);  Surgeon: Dorothy Spark, MD;  Location: [facility];  Service: Cardiovascular;  Laterality: N/A;  . [place]    . WRIST FRACTURE SURGERY       OB History   No obstetric history on file.      Home Medications    Prior to Admission medications   Medication Sig Start Date End Date Taking? Authorizing Provider  acetaminophen (TYLENOL) 500 MG tablet Take 1,500-2,000 mg by mouth every 6 (six) hours as needed for mild pain or headache.    [provider]  apixaban (ELIQUIS) 5 MG TABS tablet Take 1 tablet (5 mg total) by mouth 2 (two) times daily. 07/21/18   Herminio Commons, MD  azithromycin (ZITHROMAX Z-PAK) 250 MG tablet Take 2 tablets on day 1. Then take 1 tablet each for four days. 07/27/18   Herminio Commons, MD  buPROPion (WELLBUTRIN XL) 300 MG 24 hr tablet Take 300 mg by mouth daily. 07/06/13   [provider]    diltiazem (CARDIZEM CD) 120 MG 24 hr capsule Take 1 capsule (120 mg total) by mouth daily. 07/21/18   Herminio Commons, MD  docusate sodium (COLACE) 100 MG capsule Take 100 mg by mouth 2 (two) times daily as needed for mild constipation.    [provider]  fluticasone (FLONASE) 50 MCG/ACT nasal spray Place 2 sprays into both nostrils daily.    [provider]  furosemide (LASIX) 20 MG tablet Take 1 tablet (20 mg total) by mouth daily. 07/21/18   Herminio Commons, MD  glipiZIDE (GLUCOTROL) 10 MG tablet Take 10 mg by mouth 2 (two) times daily. 03/02/18   [provider]  loratadine (CLARITIN) 10 MG tablet Take 10 mg by mouth daily.    [provider]  pioglitazone (ACTOS) 45 MG tablet Take 45 mg by mouth daily. 05/17/18   [provider]  potassium chloride SA (K-DUR,KLOR-CON) 20 MEQ tablet Take 1 tablet (20 mEq total) by mouth daily. 07/21/18   Herminio Commons, MD  SPIRIVA RESPIMAT 1.25 MCG/ACT AERS Inhale 2 sprays into the lungs daily. 01/21/18   [provider]    Family History Family History  Problem Relation Age of Onset  . Diabetes Mother        died in her 29's of a stroke  . Cancer Mother   . Stroke Mother   . Breast cancer Mother   . Diabetes Father        died in his 5's of a stroke.  . Stroke Father   . Diabetes Brother        died @ 78 of a stroke.  . Stroke Brother   . Diabetes Maternal Grandmother   . Diabetes Maternal Grandfather   . Diabetes Paternal Grandmother   . Diabetes Paternal Grandfather   . Breast cancer Maternal Aunt     Social History Social History   Tobacco Use  . Smoking status: Never Smoker  . Smokeless tobacco: Never Used  Substance Use Topics  . Alcohol use: No  . Drug use: No     Allergies   Latex   Review of Systems Review of Systems ROS: Statement: All systems negative except as marked or noted in the HPI; Constitutional: Negative for fever and chills. ; ; Eyes:  Negative for eye pain, redness and discharge. ; ; ENMT: Negative for ear pain, hoarseness, nasal congestion, sinus pressure and sore throat. ; ; Cardiovascular: +palpitations, SOB. Negative for chest pain, diaphoresis, and peripheral edema. ; ; Respiratory: Negative for cough, wheezing and stridor. ; ; Gastrointestinal: Negative for nausea, vomiting, diarrhea, abdominal pain, blood in stool, hematemesis, jaundice and rectal bleeding. . ; ; Genitourinary: Negative for dysuria, flank pain and hematuria. ; ; Musculoskeletal: Negative for back pain and neck pain. Negative for swelling and trauma.; ; Skin: Negative for pruritus, rash, abrasions, blisters, bruising and skin lesion.; ; Neuro: Negative  for headache, lightheadedness and neck stiffness. Negative for weakness, altered level of consciousness, altered mental status, extremity weakness, paresthesias, involuntary movement, seizure and syncope.       Physical Exam Updated Vital Signs BP 122/88 (BP Location: Left Arm)   Pulse (!) 110   Temp 97.7 F (36.5 C) (Oral)   Resp (!) 22   Ht 5' 4"  (1.626 m)   Wt 113.4 kg   SpO2 100%   BMI 42.91 kg/m    Patient Vitals for the past 24 hrs:  BP Temp Temp src Pulse Resp SpO2 Height Weight  08/10/18 1130 130/74 - - 75 18 100 % - -  08/10/18 1115 - - - - 13 - - -  08/10/18 1102 127/67 - - (!) 112 20 100 % - -  08/10/18 1100 (!) 131/105 - - (!) 131 18 96 % - -  08/10/18 1030 (!) 118/58 - - (!) 109 18 98 % - -  08/10/18 1000 (!) 139/58 - - (!) 109 17 99 % - -  08/10/18 0930 129/69 - - - 19 - - -  08/10/18 0900 125/84 - - (!) 113 18 99 % - -  08/10/18 0830 130/62 - - (!) 58 13 100 % - -  08/10/18 0800 125/76 - - (!) 110 (!) 23 100 % - -  08/10/18 0745 - - - (!) 145 (!) 23 100 % - -  08/10/18 0735 122/88 97.7 F (36.5 C) Oral (!) 131 20 100 % - -  08/10/18 0730 122/88 - - (!) 57 20 100 % - -  08/10/18 0719 - - - - - - 5' 4"  (1.626 m) 113.4 kg  08/10/18 0717 - - - - - 100 % - -     Physical  Exam 0730: Physical examination:  Nursing notes reviewed; Vital signs and O2 SAT reviewed;  Constitutional: Well developed, Well nourished, Well hydrated, In no acute distress; Head:  Normocephalic, atraumatic; Eyes: EOMI, PERRL, No scleral icterus; ENMT: Mouth and pharynx normal, Mucous membranes moist; Neck: Supple, Full range of motion, No lymphadenopathy; Cardiovascular: Irregular tachycardic rate and rhythm, No gallop; Respiratory: Breath sounds coarse & equal bilaterally, No wheezes.  Speaking full sentences with ease, Normal respiratory effort/excursion; Chest: Nontender, Movement normal; Abdomen: Soft, Nontender, Nondistended, Normal bowel sounds; Genitourinary: No CVA tenderness; Extremities: Peripheral pulses normal, No tenderness, +1 pedal edema bilat. No calf asymmetry.; Neuro: AA&Ox3, Major CN grossly intact.  Speech clear. No gross focal motor or sensory deficits in extremities.; Skin: Color normal, Warm, Dry.   ED Treatments / Results  Labs (all labs ordered are listed, but only abnormal results are displayed)   EKG EKG Interpretation #1  Date/Time:  Tuesday August 10 2018 07:10:07 EST Ventricular Rate:  143 PR Interval:    QRS Duration: 83 QT Interval:  286 QTC Calculation: 442 R Axis:   80 Text Interpretation:  Sinus tachycardia with irregular rate vs  Atrial fibrillation Long R-R with ventricular escape Low voltage, extremity leads ST depression, probably rate related  Poor data quality, interpretation may be adversely affected suggest repeat tracing Confirmed by Francine Graven [phone]) on 08/10/2018 8:05:02 AM    EKG Interpretation #2  Date/Time:  Tuesday August 10 2018 07:39:21 EST Ventricular Rate:  136 PR Interval:    QRS Duration: 84 QT Interval:  291 QTC Calculation: 438 R Axis:   66 Text Interpretation:  Atrial fibrillation Low voltage, extremity leads Minimal ST depression, anterolateral leads When compared with ECG of 04/14/2018 Rate faster  Otherwise no  significant change Confirmed by Francine Graven [phone]) on 08/10/2018 8:06:10 AM        EKG Interpretation  #3  Date/Time:  Tuesday August 10 2018 11:14:01 EST Ventricular Rate:  109 PR Interval:    QRS Duration: 87 QT Interval:  332 QTC Calculation: 447 R Axis:   67 Text Interpretation:  Atrial fibrillation Low voltage, extremity leads Since last tracing of earlier today Rate slower Confirmed by Francine Graven [phone]) on 08/10/2018 12:06:38 PM        Radiology   Procedures Procedures (including critical care time)  Medications Ordered in ED Medications  diltiazem (CARDIZEM) 1 mg/mL load via infusion 10 mg (10 mg Intravenous Bolus from Bag 08/10/18 0755)    And  diltiazem (CARDIZEM) 100 mg in dextrose 5% 125m (1 mg/mL) infusion (5 mg/hr Intravenous New Bag/Given 08/10/18 0759)     Initial Impression / Assessment and Plan / ED Course  I have reviewed the triage vital signs and the nursing notes.  Pertinent labs & imaging results that were available during my care of the patient were reviewed by me and considered in my medical decision making (see chart for details).  MDM Reviewed: previous chart, nursing note and vitals Reviewed previous: labs and ECG Interpretation: labs, ECG and x-ray Total time providing critical care: 30-74 minutes. This excludes time spent performing separately reportable procedures and services. Consults: admitting MD and cardiology   CRITICAL CARE Performed by: KFrancine GravenTotal critical care time: 45 minutes Critical care time was exclusive of separately billable procedures and treating other patients. Critical care was necessary to treat or prevent imminent or life-threatening deterioration. Critical care was time spent personally by me on the following activities: development of treatment plan with patient and/or surrogate as well as nursing, discussions with consultants, evaluation of patient's response to treatment,  examination of patient, obtaining history from patient or surrogate, ordering and performing treatments and interventions, ordering and review of laboratory studies, ordering and review of radiographic studies, pulse oximetry and re-evaluation of patient's condition.  Results for orders placed or performed during the hospital encounter of 08/10/18  Brain natriuretic peptide  Result Value Ref Range   B Natriuretic Peptide 247.0 (H) 0.0 - 100.0 pg/mL  Troponin I - Once  Result Value Ref Range   Troponin I <0.03 <0.03 ng/mL  CBC with Differential  Result Value Ref Range   WBC 7.5 4.0 - 10.5 K/uL   RBC 3.40 (L) 3.87 - 5.11 MIL/uL   Hemoglobin 9.1 (L) 12.0 - 15.0 g/dL   HCT 30.4 (L) 36.0 - 46.0 %   MCV 89.4 80.0 - 100.0 fL   MCH 26.8 26.0 - 34.0 pg   MCHC 29.9 (L) 30.0 - 36.0 g/dL   RDW 17.8 (H) 11.5 - 15.5 %   Platelets 274 150 - 400 K/uL   nRBC 0.0 0.0 - 0.2 %   Neutrophils Relative % 72 %   Neutro Abs 5.4 1.7 - 7.7 K/uL   Lymphocytes Relative 13 %   Lymphs Abs 0.9 0.7 - 4.0 K/uL   Monocytes Relative 9 %   Monocytes Absolute 0.7 0.1 - 1.0 K/uL   Eosinophils Relative 5 %   Eosinophils Absolute 0.3 0.0 - 0.5 K/uL   Basophils Relative 1 %   Basophils Absolute 0.1 0.0 - 0.1 K/uL   Immature Granulocytes 0 %   Abs Immature Granulocytes 0.03 0.00 - 0.07 K/uL  Urinalysis, Routine w reflex microscopic  Result Value Ref Range   Color,  Urine YELLOW YELLOW   APPearance CLEAR CLEAR   Specific Gravity, Urine 1.012 1.005 - 1.030   pH 5.0 5.0 - 8.0   Glucose, UA NEGATIVE NEGATIVE mg/dL   Hgb urine dipstick NEGATIVE NEGATIVE   Bilirubin Urine NEGATIVE NEGATIVE   Ketones, ur NEGATIVE NEGATIVE mg/dL   Protein, ur NEGATIVE NEGATIVE mg/dL   Nitrite POSITIVE (A) NEGATIVE   Leukocytes, UA SMALL (A) NEGATIVE   RBC / HPF 0-5 0 - 5 RBC/hpf   WBC, UA 11-20 0 - 5 WBC/hpf   Bacteria, UA RARE (A) NONE SEEN   Squamous Epithelial / LPF 0-5 0 - 5  Magnesium  Result Value Ref Range   Magnesium 1.1 (L)  1.7 - 2.4 mg/dL  Comprehensive metabolic panel  Result Value Ref Range   Sodium 138 135 - 145 mmol/L   Potassium 3.4 (L) 3.5 - 5.1 mmol/L   Chloride 105 98 - 111 mmol/L   CO2 21 (L) 22 - 32 mmol/L   Glucose, Bld 231 (H) 70 - 99 mg/dL   BUN 16 8 - 23 mg/dL   Creatinine, Ser 0.85 0.44 - 1.00 mg/dL   Calcium 7.6 (L) 8.9 - 10.3 mg/dL   Total Protein 6.6 6.5 - 8.1 g/dL   Albumin 3.0 (L) 3.5 - 5.0 g/dL   AST 14 (L) 15 - 41 U/L   ALT 9 0 - 44 U/L   Alkaline Phosphatase 67 38 - 126 U/L   Total Bilirubin 1.0 0.3 - 1.2 mg/dL   GFR calc non Af Amer >60 >60 mL/min   GFR calc Af Amer >60 >60 mL/min   Anion gap 12 5 - 15  I-stat chem 8, ed  Result Value Ref Range   Sodium 139 135 - 145 mmol/L   Potassium 3.4 (L) 3.5 - 5.1 mmol/L   Chloride 104 98 - 111 mmol/L   BUN 14 8 - 23 mg/dL   Creatinine, Ser 0.80 0.44 - 1.00 mg/dL   Glucose, Bld 229 (H) 70 - 99 mg/dL   Calcium, Ion 0.98 (L) 1.15 - 1.40 mmol/L   TCO2 25 22 - 32 mmol/L   Hemoglobin 9.5 (L) 12.0 - 15.0 g/dL   HCT 28.0 (L) 36.0 - 46.0 %  I-Stat CG4 Lactic Acid, ED  Result Value Ref Range   Lactic Acid, Venous 1.19 0.5 - 1.9 mmol/L    Ct Angio Chest Pe W/cm &/or Wo Cm Result Date: 08/10/2018 CLINICAL DATA:  Shortness of breath EXAM: CT ANGIOGRAPHY CHEST WITH CONTRAST TECHNIQUE: Multidetector CT imaging of the chest was performed using the standard protocol during bolus administration of intravenous contrast. Multiplanar CT image reconstructions and MIPs were obtained to evaluate the vascular anatomy. CONTRAST:  131m ISOVUE-370 IOPAMIDOL (ISOVUE-370) INJECTION 76% COMPARISON:  Chest CT July 28, 2013; chest radiograph August 10, 2018 FINDINGS: Cardiovascular: There is no demonstrable pulmonary embolus. There is no thoracic aortic aneurysm or dissection. The visualized great vessels appear unremarkable. There is aortic atherosclerosis. There are foci of coronary artery calcification. There is no pericardial effusion or pericardial  thickening. Mediastinum/Nodes: There is a mass arising in the right lobe of the thyroid measuring 2.0 x 0.9 cm. There are subcentimeter mediastinal lymph nodes. There is no adenopathy by size criteria in the thoracic region. No esophageal lesions are evident. Lungs/Pleura: There is a sizable free-flowing pleural effusion on the right. There is consolidation throughout much of the right lower lobe. There is atelectatic change in a portion of the posterior segment right upper lobe. There is  atelectatic change in the left base. There is mild lower lobe bronchiectatic change on the left. Upper Abdomen: Gallbladder is absent. There is abdominal aortic and splenic artery atherosclerosis. Visualized upper abdominal structures otherwise appear unremarkable. Musculoskeletal: There is degenerative change in the thoracic spine. There is anterior wedging of the T6 vertebral body, also present on previous study. There are no blastic or lytic bone lesions. No evident chest wall lesions. Review of the MIP images confirms the above findings. IMPRESSION: 1. No demonstrable pulmonary embolus. No thoracic aortic aneurysm or dissection. There is aortic atherosclerosis. There are foci of coronary artery calcification. 2. Sizable free-flowing right pleural effusion with consolidation throughout much of the right lower lobe. There is atelectatic change in the posterior segment of the right upper lobe. 3. Atelectatic change left base. Mild bronchiectasis left lower lobe. 4. **An incidental finding of potential clinical significance has been found. 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 Consider further evaluation with thyroid ultrasound nonemergently. If patient is clinically hyperthyroid, consider nuclear medicine thyroid uptake and scan.** 5.  No evident thoracic adenopathy. 6.  Gallbladder absent. Aortic Atherosclerosis (ICD10-I70.0). Electronically Signed   By: Lowella Grip III M.D.   On: 08/10/2018 11:18   Dg  Chest Port 1 View Result Date: 08/10/2018 CLINICAL DATA:  Shortness of breath, palpitations EXAM: PORTABLE CHEST 1 VIEW COMPARISON:  08/09/2018 FINDINGS: Cardiomegaly with vascular congestion. Right lower lobe airspace opacity with probable layering right effusion. No confluent opacity on the left. No acute bony abnormality. IMPRESSION: Cardiomegaly with vascular congestion. Layering right effusion with right lower lobe atelectasis or infiltrate, unchanged. Electronically Signed   By: Rolm Baptise M.D.   On: 08/10/2018 08:17    1150:  On arrival: monitor afib/RVR, rates to 130's. IV cardizem bolus and gtt ordered with slow improvement of HR to 100's, now 70's. Sats 100% on R/A, but pt continued to c/o SOB. Given hx of "2 broken legs" and non-weightbearing status, CTA obtained: No PE, but +pleural effusion. This may also be contributing to pt's SOB. No overt CHF on CXR and BNP mildly elevated and lower than previous levels. +UTI, UC pending; IV rocephin given.  T/C returned from [facility] Dr. Roderic Palau, case discussed, including:  HPI, pertinent PM/SHx, VS/PE, dx testing, ED course and treatment:  Agreeable to admit.       Final Clinical Impressions(s) / ED Diagnoses   Final diagnoses:  None    ED Discharge Orders    None       Francine Graven, DO 08/11/18 [id number]

## 2018-08-10 NOTE — ED Notes (Signed)
O2 on hold per Dr Thurnell Garbe.

## 2018-08-11 LAB — GLUCOSE, CAPILLARY
Glucose-Capillary: 148 mg/dL — ABNORMAL HIGH (ref 70–99)
Glucose-Capillary: 132 mg/dL — ABNORMAL HIGH (ref 70–99)
Glucose-Capillary: 99 mg/dL (ref 70–99)
Glucose-Capillary: 141 mg/dL — ABNORMAL HIGH (ref 70–99)

## 2018-08-11 LAB — BASIC METABOLIC PANEL
ANION GAP: 9 (ref 5–15)
BUN: 13 mg/dL (ref 8–23)
CO2: 24 mmol/L (ref 22–32)
Calcium: 7.9 mg/dL — ABNORMAL LOW (ref 8.9–10.3)
Chloride: 105 mmol/L (ref 98–111)
Creatinine, Ser: 0.83 mg/dL (ref 0.44–1.00)
GFR calc Af Amer: >60 mL/min (ref >60)
GFR calc non Af Amer: >60 mL/min (ref >60)
Glucose, Bld: 143 mg/dL — ABNORMAL HIGH (ref 70–99)
Potassium: 3.8 mmol/L (ref 3.5–5.1)
Sodium: 138 mmol/L (ref 135–145)

## 2018-08-11 LAB — MAGNESIUM: MAGNESIUM: 1.8 mg/dL (ref 1.7–2.4)

## 2018-08-11 LAB — TROPONIN I: Troponin I: <0.03 ng/mL (ref <0.03)

## 2018-08-11 LAB — MRSA PCR SCREENING: MRSA by PCR: POSITIVE — AB

## 2018-08-11 MED ORDER — BUSPIRONE HCL 5 MG PO TABS
10.0000 mg | ORAL_TABLET | Freq: Three times a day (TID) | ORAL | Status: DC
  Administered 2018-08-11: 10 mg via ORAL
  Filled 2018-08-11: qty 2

## 2018-08-11 MED ORDER — TRAZODONE HCL 50 MG PO TABS
100.0000 mg | ORAL_TABLET | Freq: Every day | ORAL | Status: DC
  Administered 2018-08-11 – 2018-08-13 (×3): 100 mg via ORAL
  Filled 2018-08-11 (×3): qty 2

## 2018-08-11 MED ORDER — DM-GUAIFENESIN ER 30-600 MG PO TB12
1.0000 | ORAL_TABLET | Freq: Two times a day (BID) | ORAL | Status: DC
  Administered 2018-08-11 – 2018-08-14 (×7): 1 via ORAL
  Filled 2018-08-11 (×7): qty 1

## 2018-08-11 MED ORDER — METOPROLOL TARTRATE 25 MG PO TABS
25.0000 mg | ORAL_TABLET | Freq: Two times a day (BID) | ORAL | Status: DC
  Administered 2018-08-11: 25 mg via ORAL
  Filled 2018-08-11: qty 1

## 2018-08-11 MED ORDER — BUSPIRONE HCL 5 MG PO TABS
5.0000 mg | ORAL_TABLET | Freq: Three times a day (TID) | ORAL | Status: DC
  Administered 2018-08-11 – 2018-08-12 (×2): 5 mg via ORAL
  Filled 2018-08-11 (×2): qty 1

## 2018-08-11 NOTE — Progress Notes (Signed)
PROGRESS NOTE    [name]  TWS:[id number] DOB: August 16, 1951 DOA: 08/10/2018 PCP: [name], MD    Brief Narrative:  67 year old female with a history of atrial fibrillation, diastolic heart failure, LV thrombus on anticoagulation, hypertension and diabetes, was admitted to the hospital with shortness of breath.  Found to be in decompensated CHF and rapid atrial fibrillation.  Admitted on a Cardizem infusion and intravenous Lasix.   Assessment & Plan:   Active Problems:   Diabetes type 2, uncontrolled (HCC)   HTN (hypertension)   Atrial fibrillation with RVR (HCC)   SOB (shortness of breath)   Acute on chronic diastolic CHF (congestive heart failure) (HCC)   Acute lower UTI   Hypokalemia   COPD (chronic obstructive pulmonary disease) (HCC)   Thyroid lesion   1. Acute on chronic diastolic congestive heart failure.  Currently on IV Lasix.  Appears to be having good diuresis.  Still has some volume overload.  Continue current treatments. 2. Atrial fibrillation with rapid ventricular response.  She was initially started on intravenous Cardizem.  Oral Cardizem is also been added.  She has been weaned off of Cardizem.  She received a dose of oral metoprolol earlier today, but heart rate is currently in 50s.  Will discontinue further metoprolol and continue oral Cardizem.  Intravenous Cardizem has been weaned off.  She is anticoagulated with apixaban. 3. History of LV thrombus.  She is anticoagulated with apixaban. 4. Possible UTI.  Urine culture in process.  Currently on Levaquin. 5. COPD.  No wheezing at this time.  Continue on antibiotics and bronchodilators. 6. Diabetes.  Blood sugar stable.  Continue on sliding scale insulin. 7. Hypertension.  Blood pressures currently stable.  Continue on Cardizem. 8. Hypokalemia.  Replace. 9. Thyroid lesion.  Incidental finding on right lobe of thyroid, found on CT angiogram of chest.  This can be further followed/worked up as an  outpatient 10. Anxiety.  Currently on hydroxyzine.  Add BuSpar.   DVT prophylaxis: Apixaban Code Status: Full code Family Communication: No family present Disposition Plan: Discharge home once adequately diuresed   Consultants:     Procedures:     Antimicrobials:   Levaquin 12/24 >   Subjective: Shortness of breath is mildly better, but still present.  Not back to baseline.  Feels very anxious.  Objective: Vitals:   08/11/18 1100 08/11/18 1131 08/11/18 1200 08/11/18 1602  BP: (!) 109/50 (!) 112/51 (!) 126/101 (!) 107/55  Pulse: (!) 55 (!) 36 75 (!) 54  Resp: (!) 21 (!) 21 18 20   Temp:  (!) 97.3 F (36.3 C)  97.8 F (36.6 C)  TempSrc:  Oral  Oral  SpO2: 99% 98% 98% 99%  Weight:      Height:        Intake/Output Summary (Last 24 hours) at 08/11/2018 1614 Last data filed at 08/11/2018 1243 Gross per 24 hour  Intake 413.37 ml  Output 1100 ml  Net -686.63 ml   Filed Weights   08/10/18 0719 08/10/18 1400 08/11/18 0500  Weight: 113.4 kg 108.8 kg 109 kg    Examination:  General exam: Appears calm and comfortable  Respiratory system: crackles at bases. Respiratory effort normal. Cardiovascular system: irregular. No JVD, murmurs, rubs, gallops or clicks. Pitting edema in thighs bilaterally Gastrointestinal system: Abdomen is nondistended, soft and nontender. No organomegaly or masses felt. Normal bowel sounds heard. Central nervous system: Alert and oriented. No focal neurological deficits. Extremities: Symmetric 5 x 5 power. Skin: No rashes, lesions or  ulcers Psychiatry: Judgement and insight appear normal. Mood & affect appropriate.     Data Reviewed: I have personally reviewed following labs and imaging studies  CBC: Recent Labs  Lab 08/10/18 0743 08/10/18 0755  WBC 7.5  --   NEUTROABS 5.4  --   HGB 9.1* 9.5*  HCT 30.4* 28.0*  MCV 89.4  --   PLT 274  --    Basic Metabolic Panel: Recent Labs  Lab 08/10/18 0743 08/10/18 0747 08/10/18 0755  08/11/18 0435  NA  --  138 139 138  K  --  3.4* 3.4* 3.8  CL  --  105 104 105  CO2  --  21*  --  24  GLUCOSE  --  231* 229* 143*  BUN  --  16 14 13   CREATININE  --  0.85 0.80 0.83  CALCIUM  --  7.6*  --  7.9*  MG 1.1*  --   --  1.8   GFR: Estimated Creatinine Clearance: 79.3 mL/min (by C-G formula based on SCr of 0.83 mg/dL). Liver Function Tests: Recent Labs  Lab 08/10/18 0747  AST 14*  ALT 9  ALKPHOS 67  BILITOT 1.0  PROT 6.6  ALBUMIN 3.0*   No results for input(s): LIPASE, AMYLASE in the last 168 hours. No results for input(s): AMMONIA in the last 168 hours. Coagulation Profile: No results for input(s): INR, PROTIME in the last 168 hours. Cardiac Enzymes: Recent Labs  Lab 08/10/18 0743 08/10/18 1650 08/10/18 2229 08/11/18 0435  TROPONINI <0.03 <0.03 <0.03 <0.03   BNP (last 3 results) No results for input(s): PROBNP in the last 8760 hours. HbA1C: No results for input(s): HGBA1C in the last 72 hours. CBG: Recent Labs  Lab 08/10/18 1613 08/10/18 2112 08/11/18 0729 08/11/18 1129 08/11/18 1601  GLUCAP 182* 168* 148* 132* 99   Lipid Profile: No results for input(s): CHOL, HDL, LDLCALC, TRIG, CHOLHDL, LDLDIRECT in the last 72 hours. Thyroid Function Tests: No results for input(s): TSH, T4TOTAL, FREET4, T3FREE, THYROIDAB in the last 72 hours. Anemia Panel: No results for input(s): VITAMINB12, FOLATE, FERRITIN, TIBC, IRON, RETICCTPCT in the last 72 hours. Sepsis Labs: Recent Labs  Lab 08/10/18 0756  LATICACIDVEN 1.19    Recent Results (from the past 240 hour(s))  Urine culture     Status: None (Preliminary result)   Collection Time: 08/10/18  8:14 AM  Result Value Ref Range Status   Specimen Description   Final    URINE, CLEAN CATCH Performed at [facility], [address], [place], [place] [address]    Special Requests   Final    NONE Performed at [facility], [address] [place], [place] [address]    Culture   Final    CULTURE REINCUBATED  FOR BETTER GROWTH Performed at [facility], [place] [address], [place], [place] [address]    Report Status PENDING  Incomplete  MRSA PCR Screening     Status: Abnormal   Collection Time: 08/10/18  2:02 PM  Result Value Ref Range Status   MRSA by PCR POSITIVE (A) NEGATIVE Final    Comment:        The GeneXpert MRSA Assay (FDA approved for NASAL specimens only), is one component of a comprehensive MRSA colonization surveillance program. It is not intended to diagnose MRSA infection nor to guide or monitor treatment for MRSA infections. RESULT CALLED TO, READ BACK BY AND VERIFIED WITH: CUMMINGS,R @ 0017 ON 08/11/18 BY JUW Performed at [facility], [place]  [address] [place], [place] [address]          Radiology Studies: Ct Angio Chest Pe W/cm &/or Wo Cm  Result Date: 08/10/2018 CLINICAL DATA:  Shortness of breath EXAM: CT ANGIOGRAPHY CHEST WITH CONTRAST TECHNIQUE: Multidetector CT imaging of the chest was performed using the standard protocol during bolus administration of intravenous contrast. Multiplanar CT image reconstructions and MIPs were obtained to evaluate the vascular anatomy. CONTRAST:  139m ISOVUE-370 IOPAMIDOL (ISOVUE-370) INJECTION 76% COMPARISON:  Chest CT July 28, 2013; chest radiograph August 10, 2018 FINDINGS: Cardiovascular: There is no demonstrable pulmonary embolus. There is no thoracic aortic aneurysm or dissection. The visualized great vessels appear unremarkable. There is aortic atherosclerosis. There are foci of coronary artery calcification. There is no pericardial effusion or pericardial thickening. Mediastinum/Nodes: There is a mass arising in the right lobe of the thyroid measuring 2.0 x 0.9 cm. There are subcentimeter mediastinal lymph nodes. There is no adenopathy by size criteria in the thoracic region. No esophageal lesions are evident. Lungs/Pleura: There is a sizable free-flowing pleural effusion on the right. There is consolidation throughout  much of the right lower lobe. There is atelectatic change in a portion of the posterior segment right upper lobe. There is atelectatic change in the left base. There is mild lower lobe bronchiectatic change on the left. Upper Abdomen: Gallbladder is absent. There is abdominal aortic and splenic artery atherosclerosis. Visualized upper abdominal structures otherwise appear unremarkable. Musculoskeletal: There is degenerative change in the thoracic spine. There is anterior wedging of the T6 vertebral body, also present on previous study. There are no blastic or lytic bone lesions. No evident chest wall lesions. Review of the MIP images confirms the above findings. IMPRESSION: 1. No demonstrable pulmonary embolus. No thoracic aortic aneurysm or dissection. There is aortic atherosclerosis. There are foci of coronary artery calcification. 2. Sizable free-flowing right pleural effusion with consolidation throughout much of the right lower lobe. There is atelectatic change in the posterior segment of the right upper lobe. 3. Atelectatic change left base. Mild bronchiectasis left lower lobe. 4. **An incidental finding of potential clinical significance has been found. 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 Consider further evaluation with thyroid ultrasound nonemergently. If patient is clinically hyperthyroid, consider nuclear medicine thyroid uptake and scan.** 5.  No evident thoracic adenopathy. 6.  Gallbladder absent. Aortic Atherosclerosis (ICD10-I70.0). Electronically Signed   By: WLowella GripIII M.D.   On: 08/10/2018 11:18   Dg Chest Port 1 View  Result Date: 08/10/2018 CLINICAL DATA:  Shortness of breath, palpitations EXAM: PORTABLE CHEST 1 VIEW COMPARISON:  08/09/2018 FINDINGS: Cardiomegaly with vascular congestion. Right lower lobe airspace opacity with probable layering right effusion. No confluent opacity on the left. No acute bony abnormality. IMPRESSION: Cardiomegaly with vascular  congestion. Layering right effusion with right lower lobe atelectasis or infiltrate, unchanged. Electronically Signed   By: KRolm BaptiseM.D.   On: 08/10/2018 08:17        Scheduled Meds: . apixaban  5 mg Oral BID  . buPROPion  300 mg Oral Daily  . busPIRone  10 mg Oral TID  . dextromethorphan-guaiFENesin  1 tablet Oral BID  . diltiazem  60 mg Oral Q6H  . fluticasone  2 spray Each Nare Daily  . furosemide  40 mg Intravenous BID  . insulin aspart  0-15 Units Subcutaneous TID WC  . insulin aspart  0-5 Units Subcutaneous QHS  . levofloxacin  750 mg Oral Daily  . loratadine  10 mg  Oral Daily  . potassium chloride SA  20 mEq Oral Daily  . sodium chloride flush  3 mL Intravenous Q12H  . traZODone  100 mg Oral QHS  . umeclidinium bromide  1 puff Inhalation Daily   Continuous Infusions: . sodium chloride    . diltiazem (CARDIZEM) infusion Stopped (08/11/18 1229)     LOS: 1 day    Time spent: 44mns    JKathie Dike MD [facility] Pager [phone] If 7PM-7AM, please contact night-coverage [email or web address] Password [facility]12/25/2019, 4:14 PM

## 2018-08-11 NOTE — Progress Notes (Signed)
Patient was taken off cardizem gtt around 1200 and given her PO dose of cardizem 41m. Patient's heart rate has been ranging lower from about the higher 50s to around the 70s. Patient is still in Afib. Patient was complaining of feeling anxious after her daughter came and visited her and was "high as a kite" so she was given hydroxyzine PRN for anxiety. Shortly after receiving the hydroxyzine, patient complained of her breathing being labored. Saturations were in the high 90s but for comfort, 2 L Nasal Cannula was given to patient. A few minutes later patient was calling out for the nurse. When I went in, patient had "lost her oxygen" and said she was "high as a kite" because of the medicine I had given her. Gave patient back her oxygen to put on and told her that the medicine would make her sleepy and maybe she was just feeling those small side effects so to just try to sleep it off. Will continue to monitor.

## 2018-08-12 LAB — BASIC METABOLIC PANEL
Anion gap: 9 (ref 5–15)
BUN: 16 mg/dL (ref 8–23)
CO2: 22 mmol/L (ref 22–32)
CREATININE: 1.04 mg/dL — AB (ref 0.44–1.00)
Calcium: 8.2 mg/dL — ABNORMAL LOW (ref 8.9–10.3)
Chloride: 105 mmol/L (ref 98–111)
GFR calc Af Amer: >60 mL/min (ref >60)
GFR calc non Af Amer: 56 mL/min — ABNORMAL LOW (ref >60)
Glucose, Bld: 161 mg/dL — ABNORMAL HIGH (ref 70–99)
Potassium: 4.5 mmol/L (ref 3.5–5.1)
Sodium: 136 mmol/L (ref 135–145)

## 2018-08-12 LAB — GLUCOSE, CAPILLARY
Glucose-Capillary: 155 mg/dL — ABNORMAL HIGH (ref 70–99)
Glucose-Capillary: 186 mg/dL — ABNORMAL HIGH (ref 70–99)
Glucose-Capillary: 166 mg/dL — ABNORMAL HIGH (ref 70–99)
Glucose-Capillary: 240 mg/dL — ABNORMAL HIGH (ref 70–99)

## 2018-08-12 MED ORDER — CHLORHEXIDINE GLUCONATE CLOTH 2 % EX PADS
6.0000 | MEDICATED_PAD | Freq: Every day | CUTANEOUS | Status: DC
  Administered 2018-08-12 – 2018-08-14 (×3): 6 via TOPICAL

## 2018-08-12 MED ORDER — DILTIAZEM HCL ER COATED BEADS 240 MG PO CP24
240.0000 mg | ORAL_CAPSULE | Freq: Every day | ORAL | Status: DC
  Administered 2018-08-12 – 2018-08-14 (×3): 240 mg via ORAL
  Filled 2018-08-12 (×3): qty 1

## 2018-08-12 MED ORDER — BUDESONIDE 0.25 MG/2ML IN SUSP
0.2500 mg | Freq: Two times a day (BID) | RESPIRATORY_TRACT | Status: DC
  Administered 2018-08-12 – 2018-08-14 (×5): 0.25 mg via RESPIRATORY_TRACT
  Filled 2018-08-12 (×5): qty 2

## 2018-08-12 MED ORDER — METHYLPREDNISOLONE SODIUM SUCC 125 MG IJ SOLR
60.0000 mg | Freq: Two times a day (BID) | INTRAMUSCULAR | Status: DC
  Administered 2018-08-12 – 2018-08-14 (×5): 60 mg via INTRAVENOUS
  Filled 2018-08-12 (×5): qty 2

## 2018-08-12 MED ORDER — MUPIROCIN 2 % EX OINT
1.0000 "application " | TOPICAL_OINTMENT | Freq: Two times a day (BID) | CUTANEOUS | Status: DC
  Administered 2018-08-12 – 2018-08-14 (×5): 1 via NASAL
  Filled 2018-08-12: qty 22

## 2018-08-12 NOTE — Progress Notes (Signed)
PT Cancellation Note  Patient Details Name: [name] MRN: [id number] DOB: April 17, 1951   Cancelled Treatment:     [name]  She went to [facility] for Rehab and is now home receiving home health.  She has HH PT with [facility] and is scheduled to go home tomorrow.  She does not want an evaluation while she is in the hospital, rather she prefers to resume home health when she gets home.    Rayetta Humphrey, PT CLT [phone] 08/12/2018, 3:55 PM

## 2018-08-12 NOTE — Progress Notes (Addendum)
PROGRESS NOTE    [name]  WUG:[id number] DOB: [birth year] DOA: 08/10/2018 PCP: [name], MD    Brief Narrative:  67 year old female with a history of atrial fibrillation, diastolic heart failure, LV thrombus on anticoagulation, hypertension and diabetes, was admitted to the hospital with shortness of breath.  Found to be in decompensated CHF and rapid atrial fibrillation.  Admitted on a Cardizem infusion and intravenous Lasix.   Assessment & Plan:   Active Problems:   Diabetes type 2, uncontrolled (HCC)   HTN (hypertension)   Atrial fibrillation with RVR (HCC)   SOB (shortness of breath)   Acute on chronic diastolic CHF (congestive heart failure) (HCC)   Acute lower UTI   Hypokalemia   COPD (chronic obstructive pulmonary disease) (HCC)   Thyroid lesion   1. Acute on chronic diastolic congestive heart failure.  Currently on IV Lasix.  Volume status has improved.  Will transition oral Lasix in a.m. 2. Atrial fibrillation with rapid ventricular response.  She was initially started on intravenous Cardizem.  She was transitioned to oral Cardizem and has been off the drip since 12/25.  Heart rate is currently stable.  Will transition short acting Cardizem to long-acting.  She is anticoagulated with apixaban. 3. History of LV thrombus.  She is anticoagulated with apixaban. 4. Possible UTI.  Urine culture positive for E. coli and Klebsiella.  Follow-up sensitivities.  Currently on Levaquin. 5. COPD exacerbation.  Patient does have shortness of breath and wheezing.  Will continue on bronchodilators.  Add steroids. 6. Diabetes, type 2, uncontrolled with hyperglycemia.  Blood sugar currently stable.  Continue on sliding scale insulin. 7. Hypertension.  Blood pressures currently stable.  Continue on Cardizem. 8. Hypokalemia.  Replace. 9. Thyroid lesion.  Incidental finding on right lobe of thyroid, found on CT angiogram of chest.  This can be further followed/worked up as an  outpatient 10. Anxiety.  Feels that she could not tolerate hydroxyzine and BuSpar.   DVT prophylaxis: Apixaban Code Status: Full code Family Communication: No family present Disposition Plan: Discharge home once adequately diuresed   Consultants:     Procedures:     Antimicrobials:   Levaquin 12/24 >   Subjective: Continues to feel short of breath.  More on exertion.  Has wheezing and cough.  Objective: Vitals:   08/12/18 0800 08/12/18 0842 08/12/18 0900 08/12/18 1000  BP: 126/78  (!) 127/57 (!) 130/56  Pulse: (!) 58  92 89  Resp: 16  19 19   Temp:      TempSrc:      SpO2: 94% 97%  96%  Weight:      Height:        Intake/Output Summary (Last 24 hours) at 08/12/2018 1033 Last data filed at 08/12/2018 0900 Gross per 24 hour  Intake 679.58 ml  Output 600 ml  Net 79.58 ml   Filed Weights   08/10/18 0719 08/10/18 1400 08/11/18 0500  Weight: 113.4 kg 108.8 kg 109 kg    Examination:  General exam: Alert, awake, oriented x 3 Respiratory system: Bilateral wheezes. Respiratory effort normal. Cardiovascular system: Irregular rate and rhythm. No murmurs, rubs, gallops. Gastrointestinal system: Abdomen is nondistended, soft and nontender. No organomegaly or masses felt. Normal bowel sounds heard. Central nervous system: Alert and oriented. No focal neurological deficits. Extremities: Edema is present in thighs, lower legs show no edema. Skin: No rashes, lesions or ulcers Psychiatry: Judgement and insight appear normal. Mood & affect appropriate.    Data Reviewed: I have personally  reviewed following labs and imaging studies  CBC: Recent Labs  Lab 08/10/18 0743 08/10/18 0755  WBC 7.5  --   NEUTROABS 5.4  --   HGB 9.1* 9.5*  HCT 30.4* 28.0*  MCV 89.4  --   PLT 274  --    Basic Metabolic Panel: Recent Labs  Lab 08/10/18 0743 08/10/18 0747 08/10/18 0755 08/11/18 0435 08/12/18 0434  NA  --  138 139 138 136  K  --  3.4* 3.4* 3.8 4.5  CL  --  105 104  105 105  CO2  --  21*  --  24 22  GLUCOSE  --  231* 229* 143* 161*  BUN  --  16 14 13 16   CREATININE  --  0.85 0.80 0.83 1.04*  CALCIUM  --  7.6*  --  7.9* 8.2*  MG 1.1*  --   --  1.8  --    GFR: Estimated Creatinine Clearance: 63.3 mL/min (A) (by C-G formula based on SCr of 1.04 mg/dL (H)). Liver Function Tests: Recent Labs  Lab 08/10/18 0747  AST 14*  ALT 9  ALKPHOS 67  BILITOT 1.0  PROT 6.6  ALBUMIN 3.0*   No results for input(s): LIPASE, AMYLASE in the last 168 hours. No results for input(s): AMMONIA in the last 168 hours. Coagulation Profile: No results for input(s): INR, PROTIME in the last 168 hours. Cardiac Enzymes: Recent Labs  Lab 08/10/18 0743 08/10/18 1650 08/10/18 2229 08/11/18 0435  TROPONINI <0.03 <0.03 <0.03 <0.03   BNP (last 3 results) No results for input(s): PROBNP in the last 8760 hours. HbA1C: No results for input(s): HGBA1C in the last 72 hours. CBG: Recent Labs  Lab 08/11/18 0729 08/11/18 1129 08/11/18 1601 08/11/18 2049 08/12/18 0753  GLUCAP 148* 132* 99 141* 155*   Lipid Profile: No results for input(s): CHOL, HDL, LDLCALC, TRIG, CHOLHDL, LDLDIRECT in the last 72 hours. Thyroid Function Tests: No results for input(s): TSH, T4TOTAL, FREET4, T3FREE, THYROIDAB in the last 72 hours. Anemia Panel: No results for input(s): VITAMINB12, FOLATE, FERRITIN, TIBC, IRON, RETICCTPCT in the last 72 hours. Sepsis Labs: Recent Labs  Lab 08/10/18 0756  LATICACIDVEN 1.19    Recent Results (from the past 240 hour(s))  Urine culture     Status: Abnormal (Preliminary result)   Collection Time: 08/10/18  8:14 AM  Result Value Ref Range Status   Specimen Description   Final    URINE, CLEAN CATCH Performed at [facility], [address], [place], [place] [address]    Special Requests   Final    NONE Performed at [facility], [address], [place], [place] [address]    Culture (A)  Final    >=100,000 COLONIES/mL ESCHERICHIA COLI >=100,000  COLONIES/mL KLEBSIELLA PNEUMONIAE    Report Status PENDING  Incomplete  MRSA PCR Screening     Status: Abnormal   Collection Time: 08/10/18  2:02 PM  Result Value Ref Range Status   MRSA by PCR POSITIVE (A) NEGATIVE Final    Comment:        The GeneXpert MRSA Assay (FDA approved for NASAL specimens only), is one component of a comprehensive MRSA colonization surveillance program. It is not intended to diagnose MRSA infection nor to guide or monitor treatment for MRSA infections. RESULT CALLED TO, READ BACK BY AND VERIFIED WITH: CUMMINGS,R @ 0017 ON 08/11/18 BY JUW Performed at [facility], [address] [place], [place] [address]          Radiology Studies:  Ct Angio Chest Pe W/cm &/or Wo Cm  Result Date: 08/10/2018 CLINICAL DATA:  Shortness of breath EXAM: CT ANGIOGRAPHY CHEST WITH CONTRAST TECHNIQUE: Multidetector CT imaging of the chest was performed using the standard protocol during bolus administration of intravenous contrast. Multiplanar CT image reconstructions and MIPs were obtained to evaluate the vascular anatomy. CONTRAST:  168m ISOVUE-370 IOPAMIDOL (ISOVUE-370) INJECTION 76% COMPARISON:  Chest CT July 28, 2013; chest radiograph August 10, 2018 FINDINGS: Cardiovascular: There is no demonstrable pulmonary embolus. There is no thoracic aortic aneurysm or dissection. The visualized great vessels appear unremarkable. There is aortic atherosclerosis. There are foci of coronary artery calcification. There is no pericardial effusion or pericardial thickening. Mediastinum/Nodes: There is a mass arising in the right lobe of the thyroid measuring 2.0 x 0.9 cm. There are subcentimeter mediastinal lymph nodes. There is no adenopathy by size criteria in the thoracic region. No esophageal lesions are evident. Lungs/Pleura: There is a sizable free-flowing pleural effusion on the right. There is consolidation throughout much of the right lower lobe. There is atelectatic change in a  portion of the posterior segment right upper lobe. There is atelectatic change in the left base. There is mild lower lobe bronchiectatic change on the left. Upper Abdomen: Gallbladder is absent. There is abdominal aortic and splenic artery atherosclerosis. Visualized upper abdominal structures otherwise appear unremarkable. Musculoskeletal: There is degenerative change in the thoracic spine. There is anterior wedging of the T6 vertebral body, also present on previous study. There are no blastic or lytic bone lesions. No evident chest wall lesions. Review of the MIP images confirms the above findings. IMPRESSION: 1. No demonstrable pulmonary embolus. No thoracic aortic aneurysm or dissection. There is aortic atherosclerosis. There are foci of coronary artery calcification. 2. Sizable free-flowing right pleural effusion with consolidation throughout much of the right lower lobe. There is atelectatic change in the posterior segment of the right upper lobe. 3. Atelectatic change left base. Mild bronchiectasis left lower lobe. 4. **An incidental finding of potential clinical significance has been found. 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 Consider further evaluation with thyroid ultrasound nonemergently. If patient is clinically hyperthyroid, consider nuclear medicine thyroid uptake and scan.** 5.  No evident thoracic adenopathy. 6.  Gallbladder absent. Aortic Atherosclerosis (ICD10-I70.0). Electronically Signed   By: WLowella GripIII M.D.   On: 08/10/2018 11:18        Scheduled Meds: . apixaban  5 mg Oral BID  . budesonide (PULMICORT) nebulizer solution  0.25 mg Nebulization BID  . buPROPion  300 mg Oral Daily  . Chlorhexidine Gluconate Cloth  6 each Topical Q0600  . dextromethorphan-guaiFENesin  1 tablet Oral BID  . diltiazem  240 mg Oral Daily  . fluticasone  2 spray Each Nare Daily  . furosemide  40 mg Intravenous BID  . insulin aspart  0-15 Units Subcutaneous TID WC  .  insulin aspart  0-5 Units Subcutaneous QHS  . levofloxacin  750 mg Oral Daily  . loratadine  10 mg Oral Daily  . methylPREDNISolone (SOLU-MEDROL) injection  60 mg Intravenous Q12H  . mupirocin ointment  1 application Nasal BID  . potassium chloride SA  20 mEq Oral Daily  . sodium chloride flush  3 mL Intravenous Q12H  . traZODone  100 mg Oral QHS  . umeclidinium bromide  1 puff Inhalation Daily   Continuous Infusions: . sodium chloride    . diltiazem (CARDIZEM) infusion Stopped (08/11/18 1229)     LOS: 2 days  Time spent: 14mns    JKathie Dike MD [facility] Pager [phone] If 7PM-7AM, please contact night-coverage [email or web address] Password [facility]12/26/2019, 10:33 AM

## 2018-08-13 LAB — GLUCOSE, CAPILLARY
Glucose-Capillary: 254 mg/dL — ABNORMAL HIGH (ref 70–99)
Glucose-Capillary: 316 mg/dL — ABNORMAL HIGH (ref 70–99)
Glucose-Capillary: 369 mg/dL — ABNORMAL HIGH (ref 70–99)
Glucose-Capillary: 323 mg/dL — ABNORMAL HIGH (ref 70–99)

## 2018-08-13 LAB — BASIC METABOLIC PANEL
Anion gap: 11 (ref 5–15)
BUN: 18 mg/dL (ref 8–23)
CHLORIDE: 103 mmol/L (ref 98–111)
CO2: 23 mmol/L (ref 22–32)
Calcium: 8.7 mg/dL — ABNORMAL LOW (ref 8.9–10.3)
Creatinine, Ser: 1.06 mg/dL — ABNORMAL HIGH (ref 0.44–1.00)
GFR calc Af Amer: >60 mL/min (ref >60)
GFR calc non Af Amer: 54 mL/min — ABNORMAL LOW (ref >60)
Glucose, Bld: 239 mg/dL — ABNORMAL HIGH (ref 70–99)
Potassium: 4.9 mmol/L (ref 3.5–5.1)
Sodium: 137 mmol/L (ref 135–145)

## 2018-08-13 LAB — URINE CULTURE: Culture: >=100000 — AB

## 2018-08-13 MED ORDER — FUROSEMIDE 40 MG PO TABS
40.0000 mg | ORAL_TABLET | Freq: Every day | ORAL | Status: DC
  Administered 2018-08-14: 40 mg via ORAL
  Filled 2018-08-13: qty 1

## 2018-08-13 MED ORDER — METOPROLOL TARTRATE 25 MG PO TABS
25.0000 mg | ORAL_TABLET | Freq: Two times a day (BID) | ORAL | Status: DC
  Administered 2018-08-13 – 2018-08-14 (×3): 25 mg via ORAL
  Filled 2018-08-13 (×3): qty 1

## 2018-08-13 NOTE — Progress Notes (Signed)
PROGRESS NOTE    [name]  BHA:[id number] DOB: 13-Dec-1950 DOA: 08/10/2018 PCP: [name], MD    Brief Narrative:  67 year old female with a history of atrial fibrillation, diastolic heart failure, LV thrombus on anticoagulation, hypertension and diabetes, was admitted to the hospital with shortness of breath.  Found to be in decompensated CHF and rapid atrial fibrillation.  Admitted on a Cardizem infusion and intravenous Lasix.   Assessment & Plan:   Active Problems:   Diabetes type 2, uncontrolled (HCC)   HTN (hypertension)   Atrial fibrillation with RVR (HCC)   SOB (shortness of breath)   Acute on chronic diastolic CHF (congestive heart failure) (HCC)   Acute lower UTI   Hypokalemia   COPD (chronic obstructive pulmonary disease) (HCC)   Thyroid lesion   1. Acute on chronic diastolic congestive heart failure.  Currently on IV Lasix.  Volume status has improved.  Will transition oral Lasix today. 2. Atrial fibrillation with rapid ventricular response.  She was initially started on intravenous Cardizem.  She was transitioned to oral Cardizem and has been off the drip since 12/25.  Heart rate has been trending up.  Lopressor has been added.  She is anticoagulated with apixaban. 3. History of LV thrombus.  She is anticoagulated with apixaban. 4. UTI.  Urine culture positive for E. coli and Klebsiella.  Both are sensitive to ciprofloxacin.  Continue levofloxacin. 5. COPD exacerbation.  Overall shortness of breath and wheezing improving.  Will continue on bronchodilators and steroids. 6. Diabetes, type 2, uncontrolled with hyperglycemia.  Blood sugar elevated in the setting of steroids.  Continue on sliding scale insulin. 7. Hypertension.  Blood pressures currently stable.  Continue on Cardizem. 8. Hypokalemia.  Replace. 9. Thyroid lesion.  Incidental finding on right lobe of thyroid, found on CT angiogram of chest.  This can be further followed/worked up as an  outpatient 10. Anxiety.  Feels that she could not tolerate hydroxyzine and BuSpar.   DVT prophylaxis: Apixaban Code Status: Full code Family Communication: No family present Disposition Plan: Discharge home once adequately diuresed   Consultants:     Procedures:     Antimicrobials:   Levaquin 12/24 >   Subjective: Patient feels very anxious today.  She is worried that her heart rate has trended up overnight.  Overall feels that her breathing is doing better today.  Objective: Vitals:   08/13/18 0900 08/13/18 1000 08/13/18 1100 08/13/18 1200  BP: 133/65 128/79  138/81  Pulse: 98 (!) 121 (!) 130 99  Resp: (!) 23 18 (!) 21 16  Temp:    97.8 F (36.6 C)  TempSrc:    Oral  SpO2:  100% 98% 97%  Weight:      Height:        Intake/Output Summary (Last 24 hours) at 08/13/2018 1741 Last data filed at 08/13/2018 1100 Gross per 24 hour  Intake 240 ml  Output 900 ml  Net -660 ml   Filed Weights   08/10/18 1400 08/11/18 0500 08/13/18 0456  Weight: 108.8 kg 109 kg 108.8 kg    Examination:  General exam: Alert, awake, oriented x 3 Respiratory system: Wheezing is better today.Marland Kitchen Respiratory effort normal. Cardiovascular system: Irregular. No murmurs, rubs, gallops. Gastrointestinal system: Abdomen is nondistended, soft and nontender. No organomegaly or masses felt. Normal bowel sounds heard. Central nervous system: Alert and oriented. No focal neurological deficits. Extremities: No C/C/E, +pedal pulses Skin: No rashes, lesions or ulcers Psychiatry: Appears anxious     Data Reviewed:  I have personally reviewed following labs and imaging studies  CBC: Recent Labs  Lab 08/10/18 0743 08/10/18 0755  WBC 7.5  --   NEUTROABS 5.4  --   HGB 9.1* 9.5*  HCT 30.4* 28.0*  MCV 89.4  --   PLT 274  --    Basic Metabolic Panel: Recent Labs  Lab 08/10/18 0743 08/10/18 0747 08/10/18 0755 08/11/18 0435 08/12/18 0434 08/13/18 0349  NA  --  138 139 138 136 137  K  --   3.4* 3.4* 3.8 4.5 4.9  CL  --  105 104 105 105 103  CO2  --  21*  --  24 22 23   GLUCOSE  --  231* 229* 143* 161* 239*  BUN  --  16 14 13 16 18   CREATININE  --  0.85 0.80 0.83 1.04* 1.06*  CALCIUM  --  7.6*  --  7.9* 8.2* 8.7*  MG 1.1*  --   --  1.8  --   --    GFR: Estimated Creatinine Clearance: 62 mL/min (A) (by C-G formula based on SCr of 1.06 mg/dL (H)). Liver Function Tests: Recent Labs  Lab 08/10/18 0747  AST 14*  ALT 9  ALKPHOS 67  BILITOT 1.0  PROT 6.6  ALBUMIN 3.0*   No results for input(s): LIPASE, AMYLASE in the last 168 hours. No results for input(s): AMMONIA in the last 168 hours. Coagulation Profile: No results for input(s): INR, PROTIME in the last 168 hours. Cardiac Enzymes: Recent Labs  Lab 08/10/18 0743 08/10/18 1650 08/10/18 2229 08/11/18 0435  TROPONINI <0.03 <0.03 <0.03 <0.03   BNP (last 3 results) No results for input(s): PROBNP in the last 8760 hours. HbA1C: No results for input(s): HGBA1C in the last 72 hours. CBG: Recent Labs  Lab 08/12/18 1554 08/12/18 2118 08/13/18 0720 08/13/18 1109 08/13/18 1651  GLUCAP 166* 240* 254* 316* 369*   Lipid Profile: No results for input(s): CHOL, HDL, LDLCALC, TRIG, CHOLHDL, LDLDIRECT in the last 72 hours. Thyroid Function Tests: No results for input(s): TSH, T4TOTAL, FREET4, T3FREE, THYROIDAB in the last 72 hours. Anemia Panel: No results for input(s): VITAMINB12, FOLATE, FERRITIN, TIBC, IRON, RETICCTPCT in the last 72 hours. Sepsis Labs: Recent Labs  Lab 08/10/18 0756  LATICACIDVEN 1.19    Recent Results (from the past 240 hour(s))  Urine culture     Status: Abnormal   Collection Time: 08/10/18  8:14 AM  Result Value Ref Range Status   Specimen Description   Final    URINE, CLEAN CATCH Performed at [facility], [address] [place], [place] [address]    Special Requests   Final    NONE Performed at [facility], [address] [place], [place] [address]    Culture (A)  Final     >=100,000 COLONIES/mL ESCHERICHIA COLI >=100,000 COLONIES/mL KLEBSIELLA PNEUMONIAE    Report Status 08/13/2018 FINAL  Final   Organism ID, Bacteria ESCHERICHIA COLI (A)  Final   Organism ID, Bacteria KLEBSIELLA PNEUMONIAE (A)  Final      Susceptibility   Escherichia coli - MIC*    AMPICILLIN 4 SENSITIVE Sensitive     CEFAZOLIN <=4 SENSITIVE Sensitive     CEFTRIAXONE <=1 SENSITIVE Sensitive     CIPROFLOXACIN <=0.25 SENSITIVE Sensitive     GENTAMICIN <=1 SENSITIVE Sensitive     IMIPENEM <=0.25 SENSITIVE Sensitive     NITROFURANTOIN <=16 SENSITIVE Sensitive     TRIMETH/SULFA <=20 SENSITIVE Sensitive     AMPICILLIN/SULBACTAM <=2 SENSITIVE Sensitive  PIP/TAZO <=4 SENSITIVE Sensitive     Extended ESBL NEGATIVE Sensitive     * >=100,000 COLONIES/mL ESCHERICHIA COLI   Klebsiella pneumoniae - MIC*    AMPICILLIN >=32 RESISTANT Resistant     CEFAZOLIN <=4 SENSITIVE Sensitive     CEFTRIAXONE <=1 SENSITIVE Sensitive     CIPROFLOXACIN <=0.25 SENSITIVE Sensitive     GENTAMICIN <=1 SENSITIVE Sensitive     IMIPENEM <=0.25 SENSITIVE Sensitive     NITROFURANTOIN 64 INTERMEDIATE Intermediate     TRIMETH/SULFA <=20 SENSITIVE Sensitive     AMPICILLIN/SULBACTAM 8 SENSITIVE Sensitive     PIP/TAZO <=4 SENSITIVE Sensitive     Extended ESBL NEGATIVE Sensitive     * >=100,000 COLONIES/mL KLEBSIELLA PNEUMONIAE  MRSA PCR Screening     Status: Abnormal   Collection Time: 08/10/18  2:02 PM  Result Value Ref Range Status   MRSA by PCR POSITIVE (A) NEGATIVE Final    Comment:        The GeneXpert MRSA Assay (FDA approved for NASAL specimens only), is one component of a comprehensive MRSA colonization surveillance program. It is not intended to diagnose MRSA infection nor to guide or monitor treatment for MRSA infections. RESULT CALLED TO, READ BACK BY AND VERIFIED WITH: CUMMINGS,R @ 0017 ON 08/11/18 BY JUW Performed at [facility], [address] [place], [place] [address]           Radiology Studies: No results found.      Scheduled Meds: . apixaban  5 mg Oral BID  . budesonide (PULMICORT) nebulizer solution  0.25 mg Nebulization BID  . buPROPion  300 mg Oral Daily  . Chlorhexidine Gluconate Cloth  6 each Topical Q0600  . dextromethorphan-guaiFENesin  1 tablet Oral BID  . diltiazem  240 mg Oral Daily  . fluticasone  2 spray Each Nare Daily  . furosemide  40 mg Intravenous BID  . insulin aspart  0-15 Units Subcutaneous TID WC  . insulin aspart  0-5 Units Subcutaneous QHS  . levofloxacin  750 mg Oral Daily  . loratadine  10 mg Oral Daily  . methylPREDNISolone (SOLU-MEDROL) injection  60 mg Intravenous Q12H  . metoprolol tartrate  25 mg Oral BID  . mupirocin ointment  1 application Nasal BID  . potassium chloride SA  20 mEq Oral Daily  . sodium chloride flush  3 mL Intravenous Q12H  . traZODone  100 mg Oral QHS  . umeclidinium bromide  1 puff Inhalation Daily   Continuous Infusions: . sodium chloride    . diltiazem (CARDIZEM) infusion Stopped (08/11/18 1229)     LOS: 3 days    Time spent: 92mns    JKathie Dike MD [facility] Pager [phone] If 7PM-7AM, please contact night-coverage [email or web address] Password [email or web address]12/27/2019, 5:41 PM

## 2018-08-13 NOTE — Progress Notes (Signed)
Inpatient Diabetes Program Recommendations  AACE/ADA: New Consensus Statement on Inpatient Glycemic Control (2015)  Target Ranges:  Prepandial:   less than 140 mg/dL      Peak postprandial:   less than 180 mg/dL (1-2 hours)      Critically ill patients:  140 - 180 mg/dL   Results for BRENDALY, TOWNSEL (MRN [id number]) as of 08/13/2018 08:26  Ref. Range 08/12/2018 07:53 08/12/2018 11:27 08/12/2018 15:54 08/12/2018 21:18  Glucose-Capillary Latest Ref Range: 70 - 99 mg/dL 155 (H)  3 units NOVOLOG  186 (H)  3 units NOVOLOG  166 (H)  3 units NOVOLOG  240 (H)  2 units NOVOLOG    Results for PAYTYN, MESTA (MRN [id number]) as of 08/13/2018 08:26  Ref. Range 08/13/2018 07:20  Glucose-Capillary Latest Ref Range: 70 - 99 mg/dL 254 (H)    Admit with: CHF/ AFib with RVR  History: DM, CHF  Home DM Meds: Glipizide 10 mg BID       Actos 45 mg Daily  Current Orders: Novolog Moderate Correction Scale/ SSI (0-15 units) TID AC + HS      Started Solumedrol 60 mg BID yesterday at 12pm.  CBGs on the rise with the addition of IV steroids.     MD- May consider adding basal insulin to inpatient insulin regimen while patient receiving IV steroids:  Lantus 10 units QHS to start (0.1 units/kg dosing based on weight of 108 kg)     --Will follow patient during hospitalization--  Wyn Quaker RN, MSN, CDE Diabetes Coordinator Inpatient Glycemic Control Team Team Pager: [phone] (8a-5p)

## 2018-08-14 LAB — BASIC METABOLIC PANEL
Anion gap: 8 (ref 5–15)
BUN: 24 mg/dL — ABNORMAL HIGH (ref 8–23)
CHLORIDE: 101 mmol/L (ref 98–111)
CO2: 27 mmol/L (ref 22–32)
Calcium: 8.5 mg/dL — ABNORMAL LOW (ref 8.9–10.3)
Creatinine, Ser: 1.04 mg/dL — ABNORMAL HIGH (ref 0.44–1.00)
GFR calc Af Amer: >60 mL/min (ref >60)
GFR calc non Af Amer: 56 mL/min — ABNORMAL LOW (ref >60)
Glucose, Bld: 259 mg/dL — ABNORMAL HIGH (ref 70–99)
Potassium: 4.1 mmol/L (ref 3.5–5.1)
SODIUM: 136 mmol/L (ref 135–145)

## 2018-08-14 LAB — GLUCOSE, CAPILLARY
GLUCOSE-CAPILLARY: 281 mg/dL — AB (ref 70–99)
Glucose-Capillary: 283 mg/dL — ABNORMAL HIGH (ref 70–99)

## 2018-08-14 MED ORDER — TRAZODONE HCL 100 MG PO TABS: 100.0000 mg | ORAL_TABLET | Freq: Every evening | ORAL | 0 refills | Status: DC | PRN

## 2018-08-14 MED ORDER — DM-GUAIFENESIN ER 30-600 MG PO TB12: 1.0000 | ORAL_TABLET | Freq: Two times a day (BID) | ORAL | 0 refills | Status: DC

## 2018-08-14 MED ORDER — METOPROLOL TARTRATE 50 MG PO TABS: 50.0000 mg | ORAL_TABLET | Freq: Two times a day (BID) | ORAL | 0 refills | Status: DC

## 2018-08-14 MED ORDER — LEVALBUTEROL HCL 0.63 MG/3ML IN NEBU: 0.6300 mg | INHALATION_SOLUTION | Freq: Four times a day (QID) | RESPIRATORY_TRACT | 12 refills | Status: DC | PRN

## 2018-08-14 MED ORDER — FUROSEMIDE 40 MG PO TABS: 40.0000 mg | ORAL_TABLET | Freq: Every day | ORAL | 1 refills | Status: DC

## 2018-08-14 MED ORDER — LEVOFLOXACIN 750 MG PO TABS: 750.0000 mg | ORAL_TABLET | Freq: Every day | ORAL | 0 refills | Status: DC

## 2018-08-14 MED ORDER — PREDNISONE 10 MG PO TABS: ORAL_TABLET | ORAL | 0 refills | Status: DC

## 2018-08-14 MED ORDER — ALBUTEROL SULFATE HFA 108 (90 BASE) MCG/ACT IN AERS: 2.0000 | INHALATION_SPRAY | Freq: Four times a day (QID) | RESPIRATORY_TRACT | 2 refills | Status: DC | PRN

## 2018-08-14 MED ORDER — FLUCONAZOLE 150 MG PO TABS
150.0000 mg | ORAL_TABLET | Freq: Once | ORAL | Status: AC
  Administered 2018-08-14: 150 mg via ORAL
  Filled 2018-08-14: qty 1

## 2018-08-14 MED ORDER — DILTIAZEM HCL ER COATED BEADS 240 MG PO CP24: 240.0000 mg | ORAL_CAPSULE | Freq: Every day | ORAL | 1 refills | Status: DC

## 2018-08-14 NOTE — Progress Notes (Signed)
Patient discharged to home. IV access removed without incident. Called [facility] transport came for wheel chair discharge. Helped patient get dressed and when [facility] arrived husband rolled down to door for transport. All paperwork given and prescription called to pharmacy by Dr. Roderic Palau.

## 2018-08-14 NOTE — Care Management Note (Signed)
Case Management Note  Patient Details  Name: [name] MRN: [id number] Date of Birth: 29-Mar-1951  Subjective/Objective:  Patient to be discharged per MD order. Orders in place for home health services. Patient has resumption of care orders and is active with [facility]. Confirmed referral with Jermaine at [facility] and he has accepted the resumption of care referral. No DME needs. Family to transport.                     Action/Plan:   Expected Discharge Date:  08/14/18               Expected Discharge Plan:     In-House Referral:     Discharge planning Services  CM Consult  Post Acute Care Choice:    Choice offered to:     DME Arranged:    DME Agency:     HH Arranged:  RN, PT [place] Agency:  [facility]  Status of Service:  Completed, signed off  If discussed at [place] of Stay Meetings, dates discussed:    Additional Comments:  Katrina Drown Antoinett Dorman, RN 08/14/2018, 10:09 AM

## 2018-08-14 NOTE — Discharge Summary (Signed)
Physician Discharge Summary  [name] LEX:[id number] DOB: Dec 19, 1950 DOA: 08/10/2018  PCP: [name], MD  Admit date: 08/10/2018 Discharge date: 08/14/2018  Admitted From: Home Disposition: Home  Recommendations for Outpatient Follow-up:  1. Follow up with PCP in 1-2 weeks 2. Please obtain BMP/CBC in one week 3. Consider further work-up of right lobe thyroid lesion  Home Health: Home health RN, PT Equipment/Devices: Nebulizer machine  Discharge Condition: Stable CODE STATUS: Full code Diet recommendation: Heart healthy, carb modified  Brief/Interim Summary: 67 year old female with a history of atrial fibrillation, diastolic heart failure, LV thrombus on anticoagulation, hypertension and diabetes, was admitted to the hospital with shortness of breath.  Found to be in decompensated CHF and rapid atrial fibrillation.  Discharge Diagnoses:  Active Problems:   Diabetes type 2, uncontrolled (HCC)   HTN (hypertension)   Atrial fibrillation with RVR (HCC)   SOB (shortness of breath)   Acute on chronic diastolic CHF (congestive heart failure) (HCC)   Acute lower UTI   Hypokalemia   COPD (chronic obstructive pulmonary disease) (HCC)   Thyroid lesion  1. Acute on chronic diastolic congestive heart failure.  Treated with intravenous Lasix.  Overall volume status has improved.  She is been transitioned to oral Lasix.  Home dose of 20 mg has been increased to 40 mg daily. 2. Atrial fibrillation with rapid regular response.  Initially treated with Cardizem infusion.  He was transitioned off the drip and started on oral Cardizem.  Lopressor has also been added to her rate control regimen.  Heart rate is currently stable.  She is anticoagulated with apixaban. 3. History of LV thrombus.  Anticoagulated with apixaban. 4. Urinary tract infection.  Urine culture positive for E. coli and Klebsiella.  She was treated with levofloxacin which should cover both organisms. 5. COPD  exacerbation.  Treated with intravenous steroids, bronchodilators and antibiotics.  Overall wheezing and shortness of breath has improved.  She is been set up with home nebulizer. 6. Diabetes.  Uncontrolled with hyperglycemia.  Blood sugars elevated in setting of steroids.  Anticipate this should improve as steroids are tapered. 7. Hypertension.  Blood pressure currently stable on Cardizem and metoprolol. 8. Hypokalemia.  Replaced 9. Thyroid lesion.  Incidental finding on right lobe of thyroid, found on CT angiogram of the chest.  This can be further follow-up/work-up as an outpatient.  Discharge Instructions  Discharge Instructions    DME Nebulizer machine   Complete by:  As directed    Patient needs a nebulizer to treat with the following condition:  COPD (chronic obstructive pulmonary disease) (HCC)   Diet - low sodium heart healthy   Complete by:  As directed    Increase activity slowly   Complete by:  As directed      Allergies as of 08/14/2018      Reactions   Latex Itching, Rash      Medication List    TAKE these medications   acetaminophen 500 MG tablet Commonly known as:  TYLENOL Take 1,500-2,000 mg by mouth every 6 (six) hours as needed for mild pain or headache.   albuterol 108 (90 Base) MCG/ACT inhaler Commonly known as:  PROVENTIL HFA;VENTOLIN HFA Inhale 2 puffs into the lungs every 6 (six) hours as needed for wheezing or shortness of breath.   apixaban 5 MG Tabs tablet Commonly known as:  ELIQUIS Take 1 tablet (5 mg total) by mouth 2 (two) times daily.   buPROPion 300 MG 24 hr tablet Commonly known as:  WELLBUTRIN  XL Take 300 mg by mouth daily.   dextromethorphan-guaiFENesin 30-600 MG 12hr tablet Commonly known as:  MUCINEX DM Take 1 tablet by mouth 2 (two) times daily.   diltiazem 240 MG 24 hr capsule Commonly known as:  CARDIZEM CD Take 1 capsule (240 mg total) by mouth daily. What changed:    medication strength  how much to take   docusate  sodium 100 MG capsule Commonly known as:  COLACE Take 100 mg by mouth 2 (two) times daily as needed for mild constipation.   fluticasone 50 MCG/ACT nasal spray Commonly known as:  FLONASE Place 2 sprays into both nostrils daily.   furosemide 40 MG tablet Commonly known as:  LASIX Take 1 tablet (40 mg total) by mouth daily. What changed:    medication strength  how much to take   glipiZIDE 10 MG tablet Commonly known as:  GLUCOTROL Take 10 mg by mouth 2 (two) times daily.   levalbuterol 0.63 MG/3ML nebulizer solution Commonly known as:  XOPENEX Take 3 mLs (0.63 mg total) by nebulization every 6 (six) hours as needed for wheezing or shortness of breath.   levofloxacin 750 MG tablet Commonly known as:  LEVAQUIN Take 1 tablet (750 mg total) by mouth daily.   loratadine 10 MG tablet Commonly known as:  CLARITIN Take 10 mg by mouth daily.   metoprolol tartrate 50 MG tablet Commonly known as:  LOPRESSOR Take 1 tablet (50 mg total) by mouth 2 (two) times daily.   pioglitazone 45 MG tablet Commonly known as:  ACTOS Take 45 mg by mouth daily.   potassium chloride SA 20 MEQ tablet Commonly known as:  K-DUR,KLOR-CON Take 1 tablet (20 mEq total) by mouth daily.   predniSONE 10 MG tablet Commonly known as:  DELTASONE Take 13m po daily for 2 days then 380mdaily for 2 days then 2023maily for 2 days then 61m83mily for 2 days then stop   SPIRIVA RESPIMAT 1.25 MCG/ACT Aers Generic drug:  Tiotropium Bromide Monohydrate Inhale 2 sprays into the lungs daily.   traZODone 100 MG tablet Commonly known as:  DESYREL Take 1 tablet (100 mg total) by mouth at bedtime as needed for sleep.            Durable Medical Equipment  (From admission, onward)         Start     Ordered   08/14/18 0000  DME Nebulizer machine    Question:  Patient needs a nebulizer to treat with the following condition  Answer:  COPD (chronic obstructive pulmonary disease) (HCC)[place]12/28/19 [id number]       Allergies  Allergen Reactions  . Latex Itching and Rash    Consultations:     Procedures/Studies: Dg Chest 1 View  Result Date: 07/26/2018 CLINICAL DATA:  Hypertension and shortness of Breath EXAM: CHEST  1 VIEW COMPARISON:  04/14/2018 FINDINGS: Cardiac shadow remains prominent. Right basilar atelectasis/infiltrate is noted. No sizable effusion is noted. The left lung remains clear. Chronic changes in the proximal left humerus are noted. No acute bony abnormality is seen. IMPRESSION: Right basilar infiltrate/atelectasis. Electronically Signed   By: MarkInez Catalina.   On: 07/26/2018 09:12   Dg Chest 2 View  Result Date: 08/09/2018 CLINICAL DATA:  Persistent chest pressure, shortness of breath, and productive cough. EXAM: CHEST - 2 VIEW COMPARISON:  Chest x-ray dated July 26, 2018. FINDINGS: Stable cardiomediastinal silhouette. Normal pulmonary vascularity. Layering small right pleural effusion has decreased  in size. Improved aeration of the right lower lobe. The left lung is clear. No pneumothorax. No acute osseous abnormality. IMPRESSION: 1. Decreased small right pleural effusion with improving but persistent right lower lobe atelectasis versus infiltrate. Electronically Signed   By: Titus Dubin M.D.   On: 08/09/2018 12:22   Dg Tibia/fibula Left  Result Date: 07/26/2018 [facility] report Dictated by Dr. Aline Brochure Chief complaint proximal tibia fracture X-ray AP lateral left tib-fib proximal tibia fracture on lateral x-ray shows no displacement fracture alignment is maintained sclerotic bone is seen indicating healing of the fracture there is also a fibular fracture. Axial alignment on the AP x-ray looks good as well with fracture callus noted and fracture line resolving multiple surgical clips are noted from prior procedures probably vascular Impression healed left proximal tib-fib fracture with normal alignment   Dg Ankle Complete Right  Result Date:  07/26/2018 [facility] report Dictated by Dr. Aline Brochure Chief complaint  FRACTURE right ankle Bimalleolar fracture right ankle. X-ray shows a Weber B type fibular fracture with characteristic oblique fracture line starting at the margin of the joint nondisplaced medial malleolus fracture. Lateral x-ray shows normal alignment of both fractures Vascular clips are noted calcified tibial vessels are noted. Impression healed bimalleolar fracture right ankle   Ct Angio Chest Pe W/cm &/or Wo Cm  Result Date: 08/10/2018 CLINICAL DATA:  Shortness of breath EXAM: CT ANGIOGRAPHY CHEST WITH CONTRAST TECHNIQUE: Multidetector CT imaging of the chest was performed using the standard protocol during bolus administration of intravenous contrast. Multiplanar CT image reconstructions and MIPs were obtained to evaluate the vascular anatomy. CONTRAST:  14m ISOVUE-370 IOPAMIDOL (ISOVUE-370) INJECTION 76% COMPARISON:  Chest CT July 28, 2013; chest radiograph August 10, 2018 FINDINGS: Cardiovascular: There is no demonstrable pulmonary embolus. There is no thoracic aortic aneurysm or dissection. The visualized great vessels appear unremarkable. There is aortic atherosclerosis. There are foci of coronary artery calcification. There is no pericardial effusion or pericardial thickening. Mediastinum/Nodes: There is a mass arising in the right lobe of the thyroid measuring 2.0 x 0.9 cm. There are subcentimeter mediastinal lymph nodes. There is no adenopathy by size criteria in the thoracic region. No esophageal lesions are evident. Lungs/Pleura: There is a sizable free-flowing pleural effusion on the right. There is consolidation throughout much of the right lower lobe. There is atelectatic change in a portion of the posterior segment right upper lobe. There is atelectatic change in the left base. There is mild lower lobe bronchiectatic change on the left. Upper Abdomen: Gallbladder is absent. There is abdominal  aortic and splenic artery atherosclerosis. Visualized upper abdominal structures otherwise appear unremarkable. Musculoskeletal: There is degenerative change in the thoracic spine. There is anterior wedging of the T6 vertebral body, also present on previous study. There are no blastic or lytic bone lesions. No evident chest wall lesions. Review of the MIP images confirms the above findings. IMPRESSION: 1. No demonstrable pulmonary embolus. No thoracic aortic aneurysm or dissection. There is aortic atherosclerosis. There are foci of coronary artery calcification. 2. Sizable free-flowing right pleural effusion with consolidation throughout much of the right lower lobe. There is atelectatic change in the posterior segment of the right upper lobe. 3. Atelectatic change left base. Mild bronchiectasis left lower lobe. 4. **An incidental finding of potential clinical significance has been found. 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 Consider further evaluation with thyroid ultrasound nonemergently. If patient is clinically hyperthyroid, consider nuclear medicine thyroid uptake and scan.** 5.  No evident thoracic adenopathy. 6.  Gallbladder absent. Aortic Atherosclerosis (ICD10-I70.0). Electronically Signed   By: Lowella Grip III M.D.   On: 08/10/2018 11:18   Dg Chest Port 1 View  Result Date: 08/10/2018 CLINICAL DATA:  Shortness of breath, palpitations EXAM: PORTABLE CHEST 1 VIEW COMPARISON:  08/09/2018 FINDINGS: Cardiomegaly with vascular congestion. Right lower lobe airspace opacity with probable layering right effusion. No confluent opacity on the left. No acute bony abnormality. IMPRESSION: Cardiomegaly with vascular congestion. Layering right effusion with right lower lobe atelectasis or infiltrate, unchanged. Electronically Signed   By: Rolm Baptise M.D.   On: 08/10/2018 08:17       Subjective: Patient is feeling better today.  Shortness of breath is better.  Wheezing is better.   Still feels anxious.  Discharge Exam: Vitals:   08/14/18 1000 08/14/18 1100 08/14/18 1116 08/14/18 1200  BP: 134/75 131/76  (!) 112/94  Pulse: (!) 35 75  (!) 101  Resp: 14 18    Temp:   97.6 F (36.4 C)   TempSrc:   Oral   SpO2: 96% 95%  97%  Weight:      Height:        General: Pt is alert, awake, not in acute distress Cardiovascular: Irregular, S1/S2 +, no rubs, no gallops Respiratory: CTA bilaterally, no wheezing, no rhonchi Abdominal: Soft, NT, ND, bowel sounds + Extremities: no edema, no cyanosis    The results of significant diagnostics from this hospitalization (including imaging, microbiology, ancillary and laboratory) are listed below for reference.     Microbiology: Recent Results (from the past 240 hour(s))  Urine culture     Status: Abnormal   Collection Time: 08/10/18  8:14 AM  Result Value Ref Range Status   Specimen Description   Final    URINE, CLEAN CATCH Performed at [facility], [address], [place], [place] [address]    Special Requests   Final    NONE Performed at [facility], [address], [place], [place] [address]    Culture (A)  Final    >=100,000 COLONIES/mL ESCHERICHIA COLI >=100,000 COLONIES/mL KLEBSIELLA PNEUMONIAE    Report Status 08/13/2018 FINAL  Final   Organism ID, Bacteria ESCHERICHIA COLI (A)  Final   Organism ID, Bacteria KLEBSIELLA PNEUMONIAE (A)  Final      Susceptibility   Escherichia coli - MIC*    AMPICILLIN 4 SENSITIVE Sensitive     CEFAZOLIN <=4 SENSITIVE Sensitive     CEFTRIAXONE <=1 SENSITIVE Sensitive     CIPROFLOXACIN <=0.25 SENSITIVE Sensitive     GENTAMICIN <=1 SENSITIVE Sensitive     IMIPENEM <=0.25 SENSITIVE Sensitive     NITROFURANTOIN <=16 SENSITIVE Sensitive     TRIMETH/SULFA <=20 SENSITIVE Sensitive     AMPICILLIN/SULBACTAM <=2 SENSITIVE Sensitive     PIP/TAZO <=4 SENSITIVE Sensitive     Extended ESBL NEGATIVE Sensitive     * >=100,000 COLONIES/mL ESCHERICHIA COLI   Klebsiella pneumoniae - MIC*     AMPICILLIN >=32 RESISTANT Resistant     CEFAZOLIN <=4 SENSITIVE Sensitive     CEFTRIAXONE <=1 SENSITIVE Sensitive     CIPROFLOXACIN <=0.25 SENSITIVE Sensitive     GENTAMICIN <=1 SENSITIVE Sensitive     IMIPENEM <=0.25 SENSITIVE Sensitive     NITROFURANTOIN 64 INTERMEDIATE Intermediate     TRIMETH/SULFA <=20 SENSITIVE Sensitive     AMPICILLIN/SULBACTAM 8 SENSITIVE Sensitive     PIP/TAZO <=4 SENSITIVE Sensitive     Extended ESBL NEGATIVE Sensitive     * >=100,000  COLONIES/mL KLEBSIELLA PNEUMONIAE  MRSA PCR Screening     Status: Abnormal   Collection Time: 08/10/18  2:02 PM  Result Value Ref Range Status   MRSA by PCR POSITIVE (A) NEGATIVE Final    Comment:        The GeneXpert MRSA Assay (FDA approved for NASAL specimens only), is one component of a comprehensive MRSA colonization surveillance program. It is not intended to diagnose MRSA infection nor to guide or monitor treatment for MRSA infections. RESULT CALLED TO, READ BACK BY AND VERIFIED WITH: CUMMINGS,R @ 0017 ON 08/11/18 BY JUW Performed at [facility], [address] [place], [place] [address]      Labs: BNP (last 3 results) Recent Labs    04/10/18 1315 07/21/18 1109 08/10/18 0746  BNP 1,039.8* 751.0* 939.0*   Basic Metabolic Panel: Recent Labs  Lab 08/10/18 0743  08/10/18 0747 08/10/18 0755 08/11/18 0435 08/12/18 0434 08/13/18 0349 08/14/18 0514  NA  --    < > 138 139 138 136 137 136  K  --    < > 3.4* 3.4* 3.8 4.5 4.9 4.1  CL  --    < > 105 104 105 105 103 101  CO2  --   --  21*  --  24 22 23 27   GLUCOSE  --    < > 231* 229* 143* 161* 239* 259*  BUN  --    < > 16 14 13 16 18  24*  CREATININE  --    < > 0.85 0.80 0.83 1.04* 1.06* 1.04*  CALCIUM  --   --  7.6*  --  7.9* 8.2* 8.7* 8.5*  MG 1.1*  --   --   --  1.8  --   --   --    < > = values in this interval not displayed.   Liver Function Tests: Recent Labs  Lab 08/10/18 0747  AST 14*  ALT 9  ALKPHOS 67  BILITOT 1.0  PROT 6.6   ALBUMIN 3.0*   No results for input(s): LIPASE, AMYLASE in the last 168 hours. No results for input(s): AMMONIA in the last 168 hours. CBC: Recent Labs  Lab 08/10/18 0743 08/10/18 0755  WBC 7.5  --   NEUTROABS 5.4  --   HGB 9.1* 9.5*  HCT 30.4* 28.0*  MCV 89.4  --   PLT 274  --    Cardiac Enzymes: Recent Labs  Lab 08/10/18 0743 08/10/18 1650 08/10/18 2229 08/11/18 0435  TROPONINI <0.03 <0.03 <0.03 <0.03   BNP: Invalid input(s): POCBNP CBG: Recent Labs  Lab 08/13/18 1109 08/13/18 1651 08/13/18 2048 08/14/18 0715 08/14/18 1111  GLUCAP 316* 369* 323* 281* 283*   D-Dimer No results for input(s): DDIMER in the last 72 hours. Hgb A1c No results for input(s): HGBA1C in the last 72 hours. Lipid Profile No results for input(s): CHOL, HDL, LDLCALC, TRIG, CHOLHDL, LDLDIRECT in the last 72 hours. Thyroid function studies No results for input(s): TSH, T4TOTAL, T3FREE, THYROIDAB in the last 72 hours.  Invalid input(s): FREET3 Anemia work up No results for input(s): VITAMINB12, FOLATE, FERRITIN, TIBC, IRON, RETICCTPCT in the last 72 hours. Urinalysis    Component Value Date/Time   COLORURINE YELLOW 08/10/2018 [place] 08/10/2018 0814   LABSPEC 1.012 08/10/2018 0814   PHURINE 5.0 08/10/2018 [place] 08/10/2018 0814   HGBUR NEGATIVE 08/10/2018 0814   BILIRUBINUR NEGATIVE 08/10/2018 0814   KETONESUR NEGATIVE 08/10/2018 0814   PROTEINUR NEGATIVE 08/10/2018  1761   UROBILINOGEN 0.2 12/01/2007 1025   NITRITE POSITIVE (A) 08/10/2018 0814   LEUKOCYTESUR SMALL (A) 08/10/2018 0814   Sepsis Labs Invalid input(s): PROCALCITONIN,  WBC,  LACTICIDVEN Microbiology Recent Results (from the past 240 hour(s))  Urine culture     Status: Abnormal   Collection Time: 08/10/18  8:14 AM  Result Value Ref Range Status   Specimen Description   Final    URINE, CLEAN CATCH Performed at [facility], [address] [place], [place] [address]    Special  Requests   Final    NONE Performed at [facility], [address], [place], [place] [address]    Culture (A)  Final    >=100,000 COLONIES/mL ESCHERICHIA COLI >=100,000 COLONIES/mL KLEBSIELLA PNEUMONIAE    Report Status 08/13/2018 FINAL  Final   Organism ID, Bacteria ESCHERICHIA COLI (A)  Final   Organism ID, Bacteria KLEBSIELLA PNEUMONIAE (A)  Final      Susceptibility   Escherichia coli - MIC*    AMPICILLIN 4 SENSITIVE Sensitive     CEFAZOLIN <=4 SENSITIVE Sensitive     CEFTRIAXONE <=1 SENSITIVE Sensitive     CIPROFLOXACIN <=0.25 SENSITIVE Sensitive     GENTAMICIN <=1 SENSITIVE Sensitive     IMIPENEM <=0.25 SENSITIVE Sensitive     NITROFURANTOIN <=16 SENSITIVE Sensitive     TRIMETH/SULFA <=20 SENSITIVE Sensitive     AMPICILLIN/SULBACTAM <=2 SENSITIVE Sensitive     PIP/TAZO <=4 SENSITIVE Sensitive     Extended ESBL NEGATIVE Sensitive     * >=100,000 COLONIES/mL ESCHERICHIA COLI   Klebsiella pneumoniae - MIC*    AMPICILLIN >=32 RESISTANT Resistant     CEFAZOLIN <=4 SENSITIVE Sensitive     CEFTRIAXONE <=1 SENSITIVE Sensitive     CIPROFLOXACIN <=0.25 SENSITIVE Sensitive     GENTAMICIN <=1 SENSITIVE Sensitive     IMIPENEM <=0.25 SENSITIVE Sensitive     NITROFURANTOIN 64 INTERMEDIATE Intermediate     TRIMETH/SULFA <=20 SENSITIVE Sensitive     AMPICILLIN/SULBACTAM 8 SENSITIVE Sensitive     PIP/TAZO <=4 SENSITIVE Sensitive     Extended ESBL NEGATIVE Sensitive     * >=100,000 COLONIES/mL KLEBSIELLA PNEUMONIAE  MRSA PCR Screening     Status: Abnormal   Collection Time: 08/10/18  2:02 PM  Result Value Ref Range Status   MRSA by PCR POSITIVE (A) NEGATIVE Final    Comment:        The GeneXpert MRSA Assay (FDA approved for NASAL specimens only), is one component of a comprehensive MRSA colonization surveillance program. It is not intended to diagnose MRSA infection nor to guide or monitor treatment for MRSA infections. RESULT CALLED TO, READ BACK BY AND VERIFIED  WITH: CUMMINGS,R @ 0017 ON 08/11/18 BY JUW Performed at [facility], [address] [place],  [address]      Time coordinating discharge: 82mns  SIGNED:   JKathie Dike MD  [facility] 08/14/2018, 5:22 PM Pager   If 7PM-7AM, please contact night-coverage [email or web address] Password [email or web address]

## 2018-08-15 DIAGNOSIS — I513 Intracardiac thrombosis, not elsewhere classified: Secondary | ICD-10-CM | POA: Diagnosis not present

## 2018-08-15 DIAGNOSIS — I130 Hypertensive heart and chronic kidney disease with heart failure and stage 1 through stage 4 chronic kidney disease, or unspecified chronic kidney disease: Secondary | ICD-10-CM | POA: Diagnosis not present

## 2018-08-15 DIAGNOSIS — M810 Age-related osteoporosis without current pathological fracture: Secondary | ICD-10-CM | POA: Diagnosis not present

## 2018-08-15 DIAGNOSIS — S8251XD Displaced fracture of medial malleolus of right tibia, subsequent encounter for closed fracture with routine healing: Secondary | ICD-10-CM | POA: Diagnosis not present

## 2018-08-15 DIAGNOSIS — I482 Chronic atrial fibrillation, unspecified: Secondary | ICD-10-CM | POA: Diagnosis not present

## 2018-08-15 DIAGNOSIS — S82832D Other fracture of upper and lower end of left fibula, subsequent encounter for closed fracture with routine healing: Secondary | ICD-10-CM | POA: Diagnosis not present

## 2018-08-15 DIAGNOSIS — E1122 Type 2 diabetes mellitus with diabetic chronic kidney disease: Secondary | ICD-10-CM | POA: Diagnosis not present

## 2018-08-15 DIAGNOSIS — S82192D Other fracture of upper end of left tibia, subsequent encounter for closed fracture with routine healing: Secondary | ICD-10-CM | POA: Diagnosis not present

## 2018-08-15 DIAGNOSIS — E1165 Type 2 diabetes mellitus with hyperglycemia: Secondary | ICD-10-CM | POA: Diagnosis not present

## 2018-08-15 DIAGNOSIS — S72402D Unspecified fracture of lower end of left femur, subsequent encounter for closed fracture with routine healing: Secondary | ICD-10-CM | POA: Diagnosis not present

## 2018-08-15 DIAGNOSIS — I5032 Chronic diastolic (congestive) heart failure: Secondary | ICD-10-CM | POA: Diagnosis not present

## 2018-08-15 DIAGNOSIS — F329 Major depressive disorder, single episode, unspecified: Secondary | ICD-10-CM | POA: Diagnosis not present

## 2018-08-15 DIAGNOSIS — N183 Chronic kidney disease, stage 3 (moderate): Secondary | ICD-10-CM | POA: Diagnosis not present

## 2018-08-16 ENCOUNTER — Other Ambulatory Visit: Payer: Self-pay

## 2018-08-16 ENCOUNTER — Emergency Department (HOSPITAL_COMMUNITY): Payer: BLUE CROSS/BLUE SHIELD

## 2018-08-16 ENCOUNTER — Inpatient Hospital Stay (HOSPITAL_COMMUNITY)
Admission: EM | Admit: 2018-08-16 | Discharge: 2018-08-24 | DRG: 638 | Disposition: A | Discharge status: Skilled Nursing Facility | Payer: BLUE CROSS/BLUE SHIELD | Attending: Internal Medicine | Admitting: Internal Medicine

## 2018-08-16 ENCOUNTER — Encounter (HOSPITAL_COMMUNITY): Payer: Self-pay | Admitting: *Deleted

## 2018-08-16 DIAGNOSIS — E1165 Type 2 diabetes mellitus with hyperglycemia: Secondary | ICD-10-CM | POA: Diagnosis not present

## 2018-08-16 DIAGNOSIS — Z9049 Acquired absence of other specified parts of digestive tract: Secondary | ICD-10-CM | POA: Diagnosis not present

## 2018-08-16 DIAGNOSIS — Z87440 Personal history of urinary (tract) infections: Secondary | ICD-10-CM

## 2018-08-16 DIAGNOSIS — B9620 Unspecified Escherichia coli [E. coli] as the cause of diseases classified elsewhere: Secondary | ICD-10-CM | POA: Diagnosis not present

## 2018-08-16 DIAGNOSIS — R0689 Other abnormalities of breathing: Secondary | ICD-10-CM | POA: Diagnosis not present

## 2018-08-16 DIAGNOSIS — M810 Age-related osteoporosis without current pathological fracture: Secondary | ICD-10-CM | POA: Diagnosis present

## 2018-08-16 DIAGNOSIS — Z833 Family history of diabetes mellitus: Secondary | ICD-10-CM

## 2018-08-16 DIAGNOSIS — I959 Hypotension, unspecified: Secondary | ICD-10-CM | POA: Diagnosis not present

## 2018-08-16 DIAGNOSIS — Z9181 History of falling: Secondary | ICD-10-CM | POA: Diagnosis not present

## 2018-08-16 DIAGNOSIS — Z90710 Acquired absence of both cervix and uterus: Secondary | ICD-10-CM

## 2018-08-16 DIAGNOSIS — Z7901 Long term (current) use of anticoagulants: Secondary | ICD-10-CM

## 2018-08-16 DIAGNOSIS — IMO0002 Reserved for concepts with insufficient information to code with codable children: Secondary | ICD-10-CM | POA: Diagnosis present

## 2018-08-16 DIAGNOSIS — Z91040 Latex allergy status: Secondary | ICD-10-CM | POA: Diagnosis not present

## 2018-08-16 DIAGNOSIS — R0602 Shortness of breath: Secondary | ICD-10-CM | POA: Diagnosis not present

## 2018-08-16 DIAGNOSIS — Z803 Family history of malignant neoplasm of breast: Secondary | ICD-10-CM

## 2018-08-16 DIAGNOSIS — J441 Chronic obstructive pulmonary disease with (acute) exacerbation: Secondary | ICD-10-CM | POA: Diagnosis not present

## 2018-08-16 DIAGNOSIS — E861 Hypovolemia: Secondary | ICD-10-CM | POA: Diagnosis present

## 2018-08-16 DIAGNOSIS — Z8249 Family history of ischemic heart disease and other diseases of the circulatory system: Secondary | ICD-10-CM

## 2018-08-16 DIAGNOSIS — R062 Wheezing: Secondary | ICD-10-CM | POA: Diagnosis not present

## 2018-08-16 DIAGNOSIS — I4891 Unspecified atrial fibrillation: Secondary | ICD-10-CM | POA: Diagnosis present

## 2018-08-16 DIAGNOSIS — J449 Chronic obstructive pulmonary disease, unspecified: Secondary | ICD-10-CM | POA: Diagnosis not present

## 2018-08-16 DIAGNOSIS — T380X5A Adverse effect of glucocorticoids and synthetic analogues, initial encounter: Secondary | ICD-10-CM | POA: Diagnosis present

## 2018-08-16 DIAGNOSIS — N390 Urinary tract infection, site not specified: Secondary | ICD-10-CM | POA: Diagnosis not present

## 2018-08-16 DIAGNOSIS — R1310 Dysphagia, unspecified: Secondary | ICD-10-CM

## 2018-08-16 DIAGNOSIS — E860 Dehydration: Secondary | ICD-10-CM | POA: Diagnosis present

## 2018-08-16 DIAGNOSIS — F329 Major depressive disorder, single episode, unspecified: Secondary | ICD-10-CM | POA: Diagnosis not present

## 2018-08-16 DIAGNOSIS — I110 Hypertensive heart disease with heart failure: Secondary | ICD-10-CM | POA: Diagnosis not present

## 2018-08-16 DIAGNOSIS — I10 Essential (primary) hypertension: Secondary | ICD-10-CM | POA: Diagnosis present

## 2018-08-16 DIAGNOSIS — Z66 Do not resuscitate: Secondary | ICD-10-CM | POA: Diagnosis not present

## 2018-08-16 DIAGNOSIS — R739 Hyperglycemia, unspecified: Secondary | ICD-10-CM

## 2018-08-16 DIAGNOSIS — I5032 Chronic diastolic (congestive) heart failure: Secondary | ICD-10-CM | POA: Diagnosis present

## 2018-08-16 DIAGNOSIS — Z86718 Personal history of other venous thrombosis and embolism: Secondary | ICD-10-CM | POA: Diagnosis not present

## 2018-08-16 DIAGNOSIS — R05 Cough: Secondary | ICD-10-CM | POA: Diagnosis not present

## 2018-08-16 DIAGNOSIS — K224 Dyskinesia of esophagus: Secondary | ICD-10-CM | POA: Diagnosis present

## 2018-08-16 DIAGNOSIS — R443 Hallucinations, unspecified: Secondary | ICD-10-CM | POA: Diagnosis not present

## 2018-08-16 DIAGNOSIS — Z79899 Other long term (current) drug therapy: Secondary | ICD-10-CM | POA: Diagnosis not present

## 2018-08-16 DIAGNOSIS — I482 Chronic atrial fibrillation, unspecified: Secondary | ICD-10-CM | POA: Diagnosis not present

## 2018-08-16 DIAGNOSIS — B373 Candidiasis of vulva and vagina: Secondary | ICD-10-CM | POA: Diagnosis not present

## 2018-08-16 DIAGNOSIS — M2550 Pain in unspecified joint: Secondary | ICD-10-CM | POA: Diagnosis not present

## 2018-08-16 DIAGNOSIS — R262 Difficulty in walking, not elsewhere classified: Secondary | ICD-10-CM | POA: Diagnosis not present

## 2018-08-16 DIAGNOSIS — F339 Major depressive disorder, recurrent, unspecified: Secondary | ICD-10-CM | POA: Diagnosis not present

## 2018-08-16 DIAGNOSIS — R1314 Dysphagia, pharyngoesophageal phase: Secondary | ICD-10-CM | POA: Diagnosis not present

## 2018-08-16 DIAGNOSIS — M6281 Muscle weakness (generalized): Secondary | ICD-10-CM | POA: Diagnosis not present

## 2018-08-16 DIAGNOSIS — F32A Depression, unspecified: Secondary | ICD-10-CM | POA: Diagnosis present

## 2018-08-16 DIAGNOSIS — Z7401 Bed confinement status: Secondary | ICD-10-CM | POA: Diagnosis not present

## 2018-08-16 DIAGNOSIS — S7292XA Unspecified fracture of left femur, initial encounter for closed fracture: Secondary | ICD-10-CM | POA: Diagnosis not present

## 2018-08-16 LAB — CBC WITH DIFFERENTIAL/PLATELET
Abs Immature Granulocytes: 0.06 10*3/uL (ref 0.00–0.07)
BASOS PCT: 0 %
Basophils Absolute: 0 10*3/uL (ref 0.0–0.1)
EOS ABS: 0 10*3/uL (ref 0.0–0.5)
Eosinophils Relative: 0 %
HCT: 34 % — ABNORMAL LOW (ref 36.0–46.0)
Hemoglobin: 9.9 g/dL — ABNORMAL LOW (ref 12.0–15.0)
Immature Granulocytes: 1 %
Lymphocytes Relative: 5 %
Lymphs Abs: 0.4 10*3/uL — ABNORMAL LOW (ref 0.7–4.0)
MCH: 26.4 pg (ref 26.0–34.0)
MCHC: 29.1 g/dL — ABNORMAL LOW (ref 30.0–36.0)
MCV: 90.7 fL (ref 80.0–100.0)
Monocytes Absolute: 0.2 10*3/uL (ref 0.1–1.0)
Monocytes Relative: 3 %
Neutro Abs: 8.6 10*3/uL — ABNORMAL HIGH (ref 1.7–7.7)
Neutrophils Relative %: 91 %
Platelets: 260 10*3/uL (ref 150–400)
RBC: 3.75 MIL/uL — AB (ref 3.87–5.11)
RDW: 17.3 % — ABNORMAL HIGH (ref 11.5–15.5)
WBC: 9.3 10*3/uL (ref 4.0–10.5)
nRBC: 0 % (ref 0.0–0.2)

## 2018-08-16 LAB — COMPREHENSIVE METABOLIC PANEL
ALT: 17 U/L (ref 0–44)
ANION GAP: 9 (ref 5–15)
AST: 24 U/L (ref 15–41)
Albumin: 3.1 g/dL — ABNORMAL LOW (ref 3.5–5.0)
Alkaline Phosphatase: 51 U/L (ref 38–126)
BUN: 25 mg/dL — ABNORMAL HIGH (ref 8–23)
CO2: 26 mmol/L (ref 22–32)
Calcium: 8.5 mg/dL — ABNORMAL LOW (ref 8.9–10.3)
Chloride: 100 mmol/L (ref 98–111)
Creatinine, Ser: 1.03 mg/dL — ABNORMAL HIGH (ref 0.44–1.00)
GFR calc non Af Amer: 56 mL/min — ABNORMAL LOW (ref >60)
Glucose, Bld: 508 mg/dL (ref 70–99)
Potassium: 4.1 mmol/L (ref 3.5–5.1)
Sodium: 135 mmol/L (ref 135–145)
Total Bilirubin: 0.7 mg/dL (ref 0.3–1.2)
Total Protein: 6.6 g/dL (ref 6.5–8.1)

## 2018-08-16 LAB — URINALYSIS, ROUTINE W REFLEX MICROSCOPIC
Bacteria, UA: NONE SEEN
Bilirubin Urine: NEGATIVE
Ketones, ur: 5 mg/dL — AB
NITRITE: NEGATIVE
Protein, ur: NEGATIVE mg/dL
Specific Gravity, Urine: 1.024 (ref 1.005–1.030)
pH: 6 (ref 5.0–8.0)

## 2018-08-16 LAB — GLUCOSE, CAPILLARY: Glucose-Capillary: 375 mg/dL — ABNORMAL HIGH (ref 70–99)

## 2018-08-16 LAB — CBG MONITORING, ED: Glucose-Capillary: 446 mg/dL — ABNORMAL HIGH (ref 70–99)

## 2018-08-16 LAB — BRAIN NATRIURETIC PEPTIDE: B NATRIURETIC PEPTIDE 5: 464 pg/mL — AB (ref 0.0–100.0)

## 2018-08-16 LAB — TROPONIN I: Troponin I: <0.03 ng/mL (ref <0.03)

## 2018-08-16 MED ORDER — TRAZODONE HCL 50 MG PO TABS: 100.0000 mg | ORAL_TABLET | Freq: Every evening | ORAL | Status: DC | PRN

## 2018-08-16 MED ORDER — SODIUM CHLORIDE 0.9% FLUSH
3.0000 mL | Freq: Two times a day (BID) | INTRAVENOUS | Status: DC
  Administered 2018-08-16 – 2018-08-24 (×16): 3 mL via INTRAVENOUS

## 2018-08-16 MED ORDER — ACETAMINOPHEN 325 MG PO TABS
650.0000 mg | ORAL_TABLET | Freq: Four times a day (QID) | ORAL | Status: DC | PRN
  Administered 2018-08-23: 650 mg via ORAL
  Filled 2018-08-16: qty 2

## 2018-08-16 MED ORDER — INSULIN ASPART 100 UNIT/ML IV SOLN
10.0000 [IU] | Freq: Once | INTRAVENOUS | Status: AC
  Administered 2018-08-16: 10 [IU] via INTRAVENOUS

## 2018-08-16 MED ORDER — BUPROPION HCL ER (XL) 300 MG PO TB24
300.0000 mg | ORAL_TABLET | Freq: Every day | ORAL | Status: DC
  Administered 2018-08-17 – 2018-08-19 (×3): 300 mg via ORAL
  Filled 2018-08-16 (×3): qty 1

## 2018-08-16 MED ORDER — UMECLIDINIUM BROMIDE 62.5 MCG/INH IN AEPB
2.0000 | INHALATION_SPRAY | Freq: Every day | RESPIRATORY_TRACT | Status: DC
  Administered 2018-08-18 – 2018-08-24 (×7): 2 via RESPIRATORY_TRACT
  Filled 2018-08-16: qty 7

## 2018-08-16 MED ORDER — INSULIN ASPART 100 UNIT/ML ~~LOC~~ SOLN
0.0000 [IU] | Freq: Three times a day (TID) | SUBCUTANEOUS | Status: DC
  Administered 2018-08-17: 7 [IU] via SUBCUTANEOUS
  Administered 2018-08-17 (×2): 11 [IU] via SUBCUTANEOUS
  Administered 2018-08-18: 4 [IU] via SUBCUTANEOUS
  Administered 2018-08-18: 11 [IU] via SUBCUTANEOUS
  Administered 2018-08-18: 4 [IU] via SUBCUTANEOUS
  Administered 2018-08-19: 7 [IU] via SUBCUTANEOUS
  Administered 2018-08-19: 11 [IU] via SUBCUTANEOUS
  Administered 2018-08-19: 3 [IU] via SUBCUTANEOUS
  Administered 2018-08-20 – 2018-08-21 (×4): 7 [IU] via SUBCUTANEOUS
  Administered 2018-08-22: 11 [IU] via SUBCUTANEOUS
  Administered 2018-08-22: 4 [IU] via SUBCUTANEOUS
  Administered 2018-08-22: 3 [IU] via SUBCUTANEOUS
  Administered 2018-08-23 – 2018-08-24 (×2): 4 [IU] via SUBCUTANEOUS
  Administered 2018-08-24: 11 [IU] via SUBCUTANEOUS

## 2018-08-16 MED ORDER — ONDANSETRON HCL 4 MG/2ML IJ SOLN: 4.0000 mg | Freq: Four times a day (QID) | INTRAMUSCULAR | Status: DC | PRN

## 2018-08-16 MED ORDER — PREDNISONE 20 MG PO TABS
30.0000 mg | ORAL_TABLET | Freq: Every day | ORAL | Status: AC
  Administered 2018-08-17 – 2018-08-18 (×2): 30 mg via ORAL
  Filled 2018-08-16 (×2): qty 1

## 2018-08-16 MED ORDER — INSULIN ASPART 100 UNIT/ML ~~LOC~~ SOLN
0.0000 [IU] | Freq: Every day | SUBCUTANEOUS | Status: DC
  Administered 2018-08-16: 5 [IU] via SUBCUTANEOUS
  Administered 2018-08-17: 2 [IU] via SUBCUTANEOUS
  Administered 2018-08-18 – 2018-08-23 (×3): 3 [IU] via SUBCUTANEOUS
  Filled 2018-08-16: qty 1

## 2018-08-16 MED ORDER — INSULIN ASPART 100 UNIT/ML ~~LOC~~ SOLN
SUBCUTANEOUS | Status: AC
  Filled 2018-08-16: qty 1

## 2018-08-16 MED ORDER — DILTIAZEM HCL ER COATED BEADS 240 MG PO CP24
240.0000 mg | ORAL_CAPSULE | Freq: Every day | ORAL | Status: DC
  Administered 2018-08-17 – 2018-08-24 (×8): 240 mg via ORAL
  Filled 2018-08-16 (×8): qty 1

## 2018-08-16 MED ORDER — PREDNISONE 20 MG PO TABS
20.0000 mg | ORAL_TABLET | Freq: Every day | ORAL | Status: DC
  Filled 2018-08-16 (×2): qty 1

## 2018-08-16 MED ORDER — IPRATROPIUM-ALBUTEROL 0.5-2.5 (3) MG/3ML IN SOLN
RESPIRATORY_TRACT | Status: AC
  Filled 2018-08-16: qty 3

## 2018-08-16 MED ORDER — ALBUTEROL SULFATE (2.5 MG/3ML) 0.083% IN NEBU: 2.5000 mg | INHALATION_SOLUTION | RESPIRATORY_TRACT | Status: DC | PRN

## 2018-08-16 MED ORDER — ONDANSETRON HCL 4 MG PO TABS: 4.0000 mg | ORAL_TABLET | Freq: Four times a day (QID) | ORAL | Status: DC | PRN

## 2018-08-16 MED ORDER — DOCUSATE SODIUM 100 MG PO CAPS
100.0000 mg | ORAL_CAPSULE | Freq: Two times a day (BID) | ORAL | Status: DC | PRN
  Administered 2018-08-19 – 2018-08-20 (×2): 100 mg via ORAL
  Filled 2018-08-16 (×2): qty 1

## 2018-08-16 MED ORDER — SODIUM CHLORIDE 0.9 % IV BOLUS
500.0000 mL | Freq: Once | INTRAVENOUS | Status: AC
  Administered 2018-08-16: 500 mL via INTRAVENOUS

## 2018-08-16 MED ORDER — PREDNISONE 10 MG PO TABS: 10.0000 mg | ORAL_TABLET | Freq: Every day | ORAL | Status: DC

## 2018-08-16 MED ORDER — ACETAMINOPHEN 650 MG RE SUPP: 650.0000 mg | Freq: Four times a day (QID) | RECTAL | Status: DC | PRN

## 2018-08-16 MED ORDER — IPRATROPIUM-ALBUTEROL 0.5-2.5 (3) MG/3ML IN SOLN
3.0000 mL | Freq: Four times a day (QID) | RESPIRATORY_TRACT | Status: DC
  Administered 2018-08-16 – 2018-08-22 (×23): 3 mL via RESPIRATORY_TRACT
  Filled 2018-08-16 (×22): qty 3

## 2018-08-16 MED ORDER — SODIUM CHLORIDE 0.9 % IV SOLN
INTRAVENOUS | Status: AC
  Administered 2018-08-16: via INTRAVENOUS

## 2018-08-16 MED ORDER — METOPROLOL TARTRATE 50 MG PO TABS
50.0000 mg | ORAL_TABLET | Freq: Two times a day (BID) | ORAL | Status: DC
  Administered 2018-08-16 – 2018-08-24 (×16): 50 mg via ORAL
  Filled 2018-08-16 (×16): qty 1

## 2018-08-16 MED ORDER — APIXABAN 5 MG PO TABS
5.0000 mg | ORAL_TABLET | Freq: Two times a day (BID) | ORAL | Status: DC
  Administered 2018-08-16 – 2018-08-24 (×16): 5 mg via ORAL
  Filled 2018-08-16 (×16): qty 1

## 2018-08-16 NOTE — H&P (Signed)
History and Physical    [name] GPQ:[id number] DOB: 06-28-1951 DOA: 08/16/2018  PCP: [name], MD  Patient coming from: Home  I have personally briefly reviewed patient's old medical records in [place]  Chief Complaint: Shortness of breath, dry mouth and throat, generalized weakness  HPI: [name] is a 67 y.o. female with medical history significant for chronic diastolic CHF EF 09-40%, atrial fibrillation and LV thrombus on Eliquis, type 2 diabetes, COPD, hypertension, and depression who presents to the ED with 2 days of shortness of breath with wheezing, dry mouth and throat, and generalized weakness.    She was recently admitted from 12/24-12/28/2019 for acute on chronic diastolic CHF exacerbation.  Her Lasix was increased from 20 mg to 40 mg daily on discharge.  She was also noted to be in A. fib with RVR initially treated with IV diltiazem and eventually discharged with the addition of Lopressor to her medications.  She also had acute exacerbation of her COPD and was treated with IV steroids as an inpatient and discharged with oral prednisone taper.  She was discharged to have home albuterol nebulizers, however this was not delivered and patient states that she will receive until later this week.  She was also found to have a UTI with cultures growing E. coli and Klebsiella pneumonia and treated with Levaquin.  Patient states that since discharge she has had continued shortness of breath and wheezing as she was unable to receive her albuterol nebulizers.  She says yesterday she had frequent urination and noticed significant dryness of her mouth and throat which has kept her from maintaining adequate oral intake due to irritation.  She skipped her Lasix dose this morning as she felt dehydrated.  She says she has not noticed any swelling in her legs.  She denies any chest pain, palpitations, abdominal pain, dysuria, or fevers.  ED Course:  Initial vitals  showed BP 146/79, pulse 60, RR 20, temp 98.8 Fahrenheit, SPO2 96% on room air. Labs are notable for serum glucose 508, bicarb 26, creatinine 1.03, potassium 4.1, anion gap 9, hemoglobin 9.9 at her baseline, BNP 464, and troponin I <0.03.  2 view chest x-ray showed cardiomegaly with slight improvement in right basilar aeration and continued right pleural effusion and atelectasis.  She was given 10 units of NovoLog and 500 mL normal saline bolus over 1 hour.  The hospitalist service was consulted to admit for shortness of breath and dehydration in the setting of CHF and COPD.  Review of Systems: As per HPI otherwise 10 point review of systems negative.    Past Medical History:  Diagnosis Date  . Atrial fibrillation ([place])   . CHF (congestive heart failure) ([place])   . Depression   . Diabetes mellitus without complication ([place])   . 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 Hypertension   . Morbid obesity ([place])   . Osteoporosis   . UTI (lower urinary tract infection) 01/2016   Cipro for Klebsiella pneumoniae isolate    Past Surgical History:  Procedure Laterality Date  . ABDOMINAL HYSTERECTOMY    . CESAREAN SECTION    . CHOLECYSTECTOMY    . FEMUR IM NAIL Left 02/20/2016  . FEMUR IM NAIL Left 02/20/2016   Procedure: INTRAMEDULLARY (IM) RETROGRADE FEMORAL NAILING;  Surgeon: Leandrew Koyanagi, MD;  Location: [facility];  Service: Orthopedics;  Laterality: Left;  . PANCREAS SURGERY  1967   1 cyst excised and one cyst drained  .  TEE WITHOUT CARDIOVERSION N/A 04/05/2018   Procedure: TRANSESOPHAGEAL ECHOCARDIOGRAM (TEE);  Surgeon: Dorothy Spark, MD;  Location: [facility];  Service: Cardiovascular;  Laterality: N/A;  . [place]    . WRIST FRACTURE SURGERY       reports that she has never smoked. She has never used smokeless tobacco. She reports that she does not drink alcohol or use drugs.  Allergies  Allergen Reactions  . Latex Itching and  Rash    Family History  Problem Relation Age of Onset  . Diabetes Mother        died in her 17's of a stroke  . Cancer Mother   . Stroke Mother   . Breast cancer Mother   . Diabetes Father        died in his 85's of a stroke.  . Stroke Father   . Diabetes Brother        died @ 71 of a stroke.  . Stroke Brother   . Diabetes Maternal Grandmother   . Diabetes Maternal Grandfather   . Diabetes Paternal Grandmother   . Diabetes Paternal Grandfather   . Breast cancer Maternal Aunt      Prior to Admission medications   Medication Sig Start Date End Date Taking? Authorizing Provider  acetaminophen (TYLENOL) 500 MG tablet Take 1,500-2,000 mg by mouth every 6 (six) hours as needed for mild pain or headache.    [provider]  albuterol (PROVENTIL HFA;VENTOLIN HFA) 108 (90 Base) MCG/ACT inhaler Inhale 2 puffs into the lungs every 6 (six) hours as needed for wheezing or shortness of breath. 08/14/18   Kathie Dike, MD  apixaban (ELIQUIS) 5 MG TABS tablet Take 1 tablet (5 mg total) by mouth 2 (two) times daily. 07/21/18   Herminio Commons, MD  buPROPion (WELLBUTRIN XL) 300 MG 24 hr tablet Take 300 mg by mouth daily. 07/06/13   [provider]  dextromethorphan-guaiFENesin (MUCINEX DM) 30-600 MG 12hr tablet Take 1 tablet by mouth 2 (two) times daily. 08/14/18   Kathie Dike, MD  diltiazem (CARDIZEM CD) 240 MG 24 hr capsule Take 1 capsule (240 mg total) by mouth daily. 08/14/18   Kathie Dike, MD  docusate sodium (COLACE) 100 MG capsule Take 100 mg by mouth 2 (two) times daily as needed for mild constipation.    [provider]  fluticasone (FLONASE) 50 MCG/ACT nasal spray Place 2 sprays into both nostrils daily.    [provider]  furosemide (LASIX) 40 MG tablet Take 1 tablet (40 mg total) by mouth daily. 08/14/18   Kathie Dike, MD  glipiZIDE (GLUCOTROL) 10 MG tablet Take 10 mg by mouth 2 (two) times daily. 03/02/18   [provider]    levalbuterol Penne Lash) 0.63 MG/3ML nebulizer solution Take 3 mLs (0.63 mg total) by nebulization every 6 (six) hours as needed for wheezing or shortness of breath. 08/14/18   Kathie Dike, MD  levofloxacin (LEVAQUIN) 750 MG tablet Take 1 tablet (750 mg total) by mouth daily. 08/14/18   Kathie Dike, MD  loratadine (CLARITIN) 10 MG tablet Take 10 mg by mouth daily.    [provider]  metoprolol tartrate (LOPRESSOR) 50 MG tablet Take 1 tablet (50 mg total) by mouth 2 (two) times daily. 08/14/18   Kathie Dike, MD  pioglitazone (ACTOS) 45 MG tablet Take 45 mg by mouth daily. 05/17/18   [provider]  potassium chloride SA (K-DUR,KLOR-CON) 20 MEQ tablet Take 1 tablet (20 mEq total) by mouth daily.  07/21/18   Herminio Commons, MD  predniSONE (DELTASONE) 10 MG tablet Take 37m po daily for 2 days then 342mdaily for 2 days then 2033maily for 2 days then 30m58mily for 2 days then stop 08/14/18   MemoKathie Dike  SPIRIVA RESPIMAT 1.25 MCG/ACT AERS Inhale 2 sprays into the lungs daily. 01/21/18   [provider]  traZODone (DESYREL) 100 MG tablet Take 1 tablet (100 mg total) by mouth at bedtime as needed for sleep. 08/14/18   MemoKathie Dike    Physical Exam: Vitals:   08/16/18 2015 08/16/18 2030 08/16/18 2100 08/16/18 2120  BP:  138/64 135/70   Pulse: 60 (!) 42 77   Resp: (!) 21 14 17    Temp:      TempSrc:      SpO2: 97% 96% 100% 100%  Weight:      Height:        Constitutional: Obese woman resting supine in bed, NAD, calm, comfortable, appears tired Eyes: PERRL, lids and conjunctivae normal ENMT: Mucous membranes and tongue are dry. Posterior pharynx clear of any exudate or lesions. Normal dentition.  Neck: normal, supple, no masses. Respiratory: Coarse expiratory wheezing diffusely through bilateral lung fields, normal respiratory effort. No accessory muscle use.  Cardiovascular: Regular rate and rhythm, no murmurs / rubs / gallops. No  extremity edema. Abdomen: no tenderness, no masses palpated. No hepatosplenomegaly. Bowel sounds positive.  Musculoskeletal: no clubbing / cyanosis. No joint deformity upper and lower extremities. Good ROM, no contractures. Normal muscle tone.  Skin: no rashes, lesions, ulcers. No induration Neurologic: CN 2-12 grossly intact. Sensation intact, strength 5/5 in all 4.  Psychiatric: Normal judgment and insight. Alert and oriented x 3. Normal mood.     Labs on Admission: I have personally reviewed following labs and imaging studies  CBC: Recent Labs  Lab 08/10/18 0743 08/10/18 0755 08/16/18 1848  WBC 7.5  --  9.3  NEUTROABS 5.4  --  8.6*  HGB 9.1* 9.5* 9.9*  HCT 30.4* 28.0* 34.0*  MCV 89.4  --  90.7  PLT 274  --  260 734asic Metabolic Panel: Recent Labs  Lab 08/10/18 0743  08/11/18 0435 08/12/18 0434 08/13/18 0349 08/14/18 0514 08/16/18 1848  NA  --    < > 138 136 137 136 135  K  --    < > 3.8 4.5 4.9 4.1 4.1  CL  --    < > 105 105 103 101 100  CO2  --    < > 24 22 23 27 26   GLUCOSE  --    < > 143* 161* 239* 259* 508*  BUN  --    < > 13 16 18  24* 25*  CREATININE  --    < > 0.83 1.04* 1.06* 1.04* 1.03*  CALCIUM  --    < > 7.9* 8.2* 8.7* 8.5* 8.5*  MG 1.1*  --  1.8  --   --   --   --    < > = values in this interval not displayed.   GFR: Estimated Creatinine Clearance: 63.3 mL/min (A) (by C-G formula based on SCr of 1.03 mg/dL (H)). Liver Function Tests: Recent Labs  Lab 08/10/18 0747 08/16/18 1848  AST 14* 24  ALT 9 17  ALKPHOS 67 51  BILITOT 1.0 0.7  PROT 6.6 6.6  ALBUMIN 3.0* 3.1*   No results for input(s): LIPASE, AMYLASE in the last 168 hours. No results for input(s): AMMONIA in  the last 168 hours. Coagulation Profile: No results for input(s): INR, PROTIME in the last 168 hours. Cardiac Enzymes: Recent Labs  Lab 08/10/18 0743 08/10/18 1650 08/10/18 2229 08/11/18 0435 08/16/18 1848  TROPONINI <0.03 <0.03 <0.03 <0.03 <0.03   BNP (last 3  results) No results for input(s): PROBNP in the last 8760 hours. HbA1C: No results for input(s): HGBA1C in the last 72 hours. CBG: Recent Labs  Lab 08/13/18 1651 08/13/18 2048 08/14/18 0715 08/14/18 1111 08/16/18 2115  GLUCAP 369* 323* 281* 283* 446*   Lipid Profile: No results for input(s): CHOL, HDL, LDLCALC, TRIG, CHOLHDL, LDLDIRECT in the last 72 hours. Thyroid Function Tests: No results for input(s): TSH, T4TOTAL, FREET4, T3FREE, THYROIDAB in the last 72 hours. Anemia Panel: No results for input(s): VITAMINB12, FOLATE, FERRITIN, TIBC, IRON, RETICCTPCT in the last 72 hours. Urine analysis:    Component Value Date/Time   COLORURINE YELLOW 08/10/2018 [place] 08/10/2018 0814   LABSPEC 1.012 08/10/2018 0814   PHURINE 5.0 08/10/2018 0814   GLUCOSEU NEGATIVE 08/10/2018 0814   HGBUR NEGATIVE 08/10/2018 0814   BILIRUBINUR NEGATIVE 08/10/2018 0814   KETONESUR NEGATIVE 08/10/2018 0814   PROTEINUR NEGATIVE 08/10/2018 0814   UROBILINOGEN 0.2 12/01/2007 1025   NITRITE POSITIVE (A) 08/10/2018 0814   LEUKOCYTESUR SMALL (A) 08/10/2018 0814    Radiological Exams on Admission: Dg Chest 2 View  Result Date: 08/16/2018 CLINICAL DATA:  Acute cough and shortness of breath. EXAM: CHEST - 2 VIEW COMPARISON:  08/10/2018 and prior radiographs FINDINGS: Cardiomegaly again noted. RIGHT pleural effusion and RIGHT basilar atelectasis again noted with slightly improved RIGHT basilar aeration. There is no evidence of pneumothorax. No acute bony abnormalities identified. IMPRESSION: Slightly improved RIGHT basilar aeration since 08/10/2018 with continued RIGHT pleural effusion and RIGHT basilar atelectasis. Cardiomegaly Electronically Signed   By: Margarette Canada M.D.   On: 08/16/2018 18:51    EKG: Independently reviewed.  Atrial fibrillation, rate 84 bpm, no acute ischemic changes.  Assessment/Plan Principal Problem:   Dehydration Active Problems:   Diabetes type 2, uncontrolled  (HCC)   HTN (hypertension)   Depression   Unspecified atrial fibrillation (H[facility])   Chronic diastolic CHF (congestive heart failure) ([facility])   COPD (chronic obstructive pulmonary disease) ([place])   [name] is a 67 y.o. female with medical history significant for chronic diastolic CHF EF 63-33%, atrial fibrillation and LV thrombus on Eliquis, type 2 diabetes, COPD, hypertension, and depression who presents to the ED with 2 days of shortness of breath with wheezing, dry mouth and throat, and generalized weakness admitted with dehydration and hyperglycemia.  Dehydration: Patient appears significantly dehydrated on examination likely from frequent urination after slight increase in home oral Lasix and polyuria from hyperglycemia in the setting of steroid use. -s/p 500 mL NS bolus in the ED -Gentle IV fluid hydration 75 mL/hr overnight -Strict I/O's, daily weights in the setting of CHF -Hold home Lasix  COPD: Patient with diffuse coarse expiratory wheezing on admission, persistent since discharge unfortunately due to not having her home nebulizer delivered.  She is saturating well on room air at this time. -Scheduled duo nebs, PRN albuterol nebs -Continue prednisone taper 30 mg x 2 days, then 20 mg x 2 days, then 10 mg x 2 days then stop   Type 2 diabetes with hyperglycemia: She is on pioglitazone and glipizide at home.  Serum glucose on admission is 508 likely exacerbated by prednisone use. -s/p Novolog 10 units admission -Start resistant SSI with HS correction -  Hold home oral meds, consider stopping pioglitazone on discharge in the setting of CHF   Chronic diastolic CHF: Appears hypovolemic on exam as above. -Holding Lasix -strict I/O's -Continue home Lopressor   Atrial fibrillation/LV thrombus: She maintains in atrial fibrillation with good rate control on admission. -Continue Lopressor and diltiazem -Continue Eliquis  Hypertension: BP controlled on admission. -Continue  Lopressor and diltiazem  Depression: -Continue home bupropion and trazodone  DVT prophylaxis: Eliquis Code Status: DNR Family Communication: Discussed with patient and husband at bedside Disposition Plan: Pending clinical progress with fluid hydration and management of COPD Consults called: None Admission status: Observation   Zada Finders MD [facility] Pager [phone]  If 7PM-7AM, please contact night-coverage [email or web address] Password [email or web address]  08/16/2018, 9:30 PM

## 2018-08-16 NOTE — ED Notes (Signed)
CRITICAL VALUE ALERT  Critical Value: Glucose 508  Date & Time Notied:  08/16/18 1943  Provider Notified: Dr. Ileene Patrick  Orders Received/Actions taken: n/a

## 2018-08-16 NOTE — ED Triage Notes (Signed)
Pt brought in by [facility] with c/o SOB. Pt was discharged from [facility] on Saturday with pneumonia. Pt was supposed to have home health come out to bring a nebulizer machine to her but they never did so she hasn't been able to get her breathing treatments at home. Pt reports she left the hospital feeling SOB even when she was discharged. Pt also reports feeling "very dry" and unable to eat or drink. Pt denies n/v. Pt reports she just feels as if she "can't get anything down". Pt was given Duoneb by EMS. Pt has taken Prednisone PO today so EMS didn't give anymore. Pt is 96% on RA. CBG 475.

## 2018-08-16 NOTE — ED Provider Notes (Signed)
[facility] EMERGENCY DEPARTMENT Provider Note   CSN: [id number] Arrival date & time: 08/16/18  1733     History   Chief Complaint Chief Complaint  Patient presents with  . Shortness of Breath    HPI [name] is a 67 y.o. female.  HPI Patient discharged from the hospital 2 days ago for CHF exacerbation, urinary tract infection and COPD exacerbation.  Given course of steroids and Levaquin.  States she is supposed to have home health set up nebulizer but no one is come to her house.  She is complaining of worsening cough and shortness of breath.  She also complains of dry tongue and difficulty swallowing.  No fever or chills.  States the swelling in her legs have gone down. Past Medical History:  Diagnosis Date  . Atrial fibrillation ([place])   . CHF (congestive heart failure) ([place])   . Depression   . Diabetes mellitus without complication ([place])   . 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 Hypertension   . Morbid obesity ([place])   . Osteoporosis   . UTI (lower urinary tract infection) 01/2016   Cipro for Klebsiella pneumoniae isolate    Patient Active Problem List   Diagnosis Date Noted  . Dehydration 08/16/2018  . Acute on chronic diastolic CHF (congestive heart failure) ([place]) 08/10/2018  . Acute lower UTI 08/10/2018  . Hypokalemia 08/10/2018  . COPD (chronic obstructive pulmonary disease) ([place]) 08/10/2018  . Thyroid lesion 08/10/2018  . Closed fracture of right ankle 06/10/2018  . Closed fracture of left tibia and fibula with routine healing 04/13/18 06/10/2018  . Unspecified atrial fibrillation ([place]) 04/15/2018  . Chronic diastolic CHF (congestive heart failure) ([place]) 04/15/2018  . CKD (chronic kidney disease), stage III ([place]) 04/15/2018  . Closed right fibular fracture 04/15/2018  . Left tibial fracture 04/14/2018  . CHF exacerbation ([place]) 04/10/2018  . SOB (shortness of breath)   . Acute CHF (congestive heart failure) ([place])  04/01/2018  . Atrial fibrillation with RVR ([place]) 04/01/2018  . [place] 03/11/2016  . Muscle spasms of lower extremity 03/04/2016  . Acute renal insufficiency 02/26/2016  . History of MRSA infection 02/26/2016  . Diabetes type 2, uncontrolled ([place]) 02/19/2016  . HTN (hypertension) 02/19/2016  . Fracture, femur, distal ([place]) 02/19/2016  . Fall 02/19/2016  . Depression 02/19/2016  . Closed left femoral fracture, initial encounter 02/19/2016  . Femur fracture, left ([place]) 02/19/2016    Past Surgical History:  Procedure Laterality Date  . ABDOMINAL HYSTERECTOMY    . CESAREAN SECTION    . CHOLECYSTECTOMY    . FEMUR IM NAIL Left 02/20/2016  . FEMUR IM NAIL Left 02/20/2016   Procedure: INTRAMEDULLARY (IM) RETROGRADE FEMORAL NAILING;  Surgeon: Leandrew Koyanagi, MD;  Location: [place];  Service: Orthopedics;  Laterality: Left;  . PANCREAS SURGERY  1967   1 cyst excised and one cyst drained  . TEE WITHOUT CARDIOVERSION N/A 04/05/2018   Procedure: TRANSESOPHAGEAL ECHOCARDIOGRAM (TEE);  Surgeon: Dorothy Spark, MD;  Location: [place];  Service: Cardiovascular;  Laterality: N/A;  . Lupus    . WRIST FRACTURE SURGERY       OB History   No obstetric history on file.      Home Medications    Prior to Admission medications   Medication Sig Start Date End Date Taking? Authorizing Provider  acetaminophen (TYLENOL) 500 MG tablet Take 1,500-2,000 mg by mouth every 6 (six) hours as needed for mild pain  or headache.   Yes [provider]  albuterol (PROVENTIL HFA;VENTOLIN HFA) 108 (90 Base) MCG/ACT inhaler Inhale 2 puffs into the lungs every 6 (six) hours as needed for wheezing or shortness of breath. 08/14/18  Yes Kathie Dike, MD  apixaban (ELIQUIS) 5 MG TABS tablet Take 1 tablet (5 mg total) by mouth 2 (two) times daily. 07/21/18  Yes Herminio Commons, MD  buPROPion (WELLBUTRIN XL) 300 MG 24 hr tablet Take 300 mg by mouth daily. 07/06/13  Yes [provider]  dextromethorphan-guaiFENesin (MUCINEX DM) 30-600 MG 12hr tablet Take 1 tablet by mouth 2 (two) times daily. 08/14/18  Yes Kathie Dike, MD  diltiazem (CARDIZEM CD) 240 MG 24 hr capsule Take 1 capsule (240 mg total) by mouth daily. 08/14/18  Yes Kathie Dike, MD  fluticasone (FLONASE) 50 MCG/ACT nasal spray Place 2 sprays into both nostrils daily.   Yes [provider]  furosemide (LASIX) 40 MG tablet Take 1 tablet (40 mg total) by mouth daily. 08/14/18  Yes Kathie Dike, MD  glipiZIDE (GLUCOTROL) 10 MG tablet Take 10 mg by mouth 2 (two) times daily. 03/02/18  Yes [provider]  levofloxacin (LEVAQUIN) 750 MG tablet Take 1 tablet (750 mg total) by mouth daily. 08/14/18  Yes Kathie Dike, MD  loratadine (CLARITIN) 10 MG tablet Take 10 mg by mouth daily.   Yes [provider]  metoprolol tartrate (LOPRESSOR) 50 MG tablet Take 1 tablet (50 mg total) by mouth 2 (two) times daily. 08/14/18  Yes Kathie Dike, MD  pioglitazone (ACTOS) 45 MG tablet Take 45 mg by mouth daily. 05/17/18  Yes [provider]  potassium chloride SA (K-DUR,KLOR-CON) 20 MEQ tablet Take 1 tablet (20 mEq total) by mouth daily. 07/21/18  Yes Herminio Commons, MD  predniSONE (DELTASONE) 10 MG tablet Take 33m po daily for 2 days then 329mdaily for 2 days then 2067maily for 2 days then 16m28mily for 2 days then stop 08/14/18  Yes Memon, JehaJolaine Artist  SPIRIVA RESPIMAT 1.25 MCG/ACT AERS Inhale 2 sprays into the lungs daily. 01/21/18  Yes [provider]  traZODone (DESYREL) 100 MG tablet Take 1 tablet (100 mg total) by mouth at bedtime as needed for sleep. 08/14/18  Yes MemoKathie Dike  levalbuterol (XOPENEX) 0.63 MG/3ML nebulizer solution Take 3 mLs (0.63 mg total) by nebulization every 6 (six) hours as needed for wheezing or shortness of breath. 08/14/18   MemoKathie Dike    Family History Family History  Problem Relation Age of Onset  . Diabetes  Mother        died in her 80's67'sa stroke  . Cancer Mother   . Stroke Mother   . Breast cancer Mother   . Diabetes Father        died in his 70's28'sa stroke.  . Stroke Father   . Diabetes Brother        died @ 62 o68a stroke.  . Stroke Brother   . Diabetes Maternal Grandmother   . Diabetes Maternal Grandfather   . Diabetes Paternal Grandmother   . Diabetes Paternal Grandfather   . Breast cancer Maternal Aunt     Social History Social History   Tobacco Use  . Smoking status: Never Smoker  . Smokeless tobacco: Never Used  Substance Use Topics  . Alcohol use: No  . Drug use: No     Allergies   Latex   Review of Systems Review of Systems  Constitutional: Positive for appetite change and fatigue. Negative for chills and fever.  HENT: Positive for trouble swallowing. Negative for sore throat.   Eyes: Negative for photophobia and visual disturbance.  Respiratory: Positive for cough, shortness of breath and wheezing.   Cardiovascular: Negative for chest pain, palpitations and leg swelling.  Gastrointestinal: Negative for abdominal pain, constipation, diarrhea, nausea and vomiting.  Genitourinary: Negative for dysuria, flank pain and frequency.  Musculoskeletal: Negative for back pain, myalgias and neck pain.  Skin: Negative for rash and wound.  Neurological: Positive for weakness. Negative for dizziness, light-headedness, numbness and headaches.  All other systems reviewed and are negative.    Physical Exam Updated Vital Signs BP (!) 116/57 (BP Location: Right Arm)   Pulse 71   Temp 98 F (36.7 C) (Oral)   Resp 18   Ht 5' 4"  (1.626 m)   Wt 107.1 kg   SpO2 96%   BMI 40.53 kg/m   Physical Exam Vitals signs and nursing note reviewed.  Constitutional:      Appearance: Normal appearance. She is well-developed.  HENT:     Head: Normocephalic and atraumatic.     Mouth/Throat:     Mouth: Mucous membranes are dry.     Comments: Dry tongue but appears to be  normal size. Eyes:     Extraocular Movements: Extraocular movements intact.     Pupils: Pupils are equal, round, and reactive to light.  Neck:     Musculoskeletal: Normal range of motion and neck supple. No neck rigidity or muscular tenderness.     Vascular: No carotid bruit.  Cardiovascular:     Rate and Rhythm: Normal rate and regular rhythm.  Pulmonary:     Effort: Pulmonary effort is normal.     Breath sounds: Wheezing, rhonchi and rales present.     Comments: No respiratory distress. Abdominal:     General: Bowel sounds are normal.     Palpations: Abdomen is soft.     Tenderness: There is no abdominal tenderness. There is no guarding or rebound.  Musculoskeletal: Normal range of motion.        General: No swelling, tenderness, deformity or signs of injury.     Right lower leg: No edema.     Left lower leg: No edema.  Lymphadenopathy:     Cervical: No cervical adenopathy.  Skin:    General: Skin is warm and dry.     Findings: No erythema or rash.  Neurological:     General: No focal deficit present.     Mental Status: She is alert and oriented to person, place, and time.     Comments: Moves all extremities without focal deficit.  Sensation intact.  Psychiatric:        Mood and Affect: Mood normal.        Behavior: Behavior normal.      ED Treatments / Results  Labs (all labs ordered are listed, but only abnormal results are displayed) Labs Reviewed  CBC WITH DIFFERENTIAL/PLATELET - Abnormal; Notable for the following components:      Result Value   RBC 3.75 (*)    Hemoglobin 9.9 (*)    HCT 34.0 (*)    MCHC 29.1 (*)    RDW 17.3 (*)    Neutro Abs 8.6 (*)    Lymphs Abs 0.4 (*)    All other components within normal limits  BRAIN NATRIURETIC PEPTIDE - Abnormal; Notable for the following components:   B Natriuretic Peptide 464.0 (*)  All other components within normal limits  COMPREHENSIVE METABOLIC PANEL - Abnormal; Notable for the following components:    Glucose, Bld 508 (*)    BUN 25 (*)    Creatinine, Ser 1.03 (*)    Calcium 8.5 (*)    Albumin 3.1 (*)    GFR calc non Af Amer 56 (*)    All other components within normal limits  URINALYSIS, ROUTINE W REFLEX MICROSCOPIC - Abnormal; Notable for the following components:   Glucose, UA >=500 (*)    Hgb urine dipstick MODERATE (*)    Ketones, ur 5 (*)    Leukocytes, UA TRACE (*)    All other components within normal limits  BASIC METABOLIC PANEL - Abnormal; Notable for the following components:   Glucose, Bld 314 (*)    Calcium 8.3 (*)    All other components within normal limits  CBC - Abnormal; Notable for the following components:   RBC 3.48 (*)    Hemoglobin 9.4 (*)    HCT 31.9 (*)    MCHC 29.5 (*)    RDW 17.2 (*)    All other components within normal limits  GLUCOSE, CAPILLARY - Abnormal; Notable for the following components:   Glucose-Capillary 375 (*)    All other components within normal limits  GLUCOSE, CAPILLARY - Abnormal; Notable for the following components:   Glucose-Capillary 273 (*)    All other components within normal limits  GLUCOSE, CAPILLARY - Abnormal; Notable for the following components:   Glucose-Capillary 209 (*)    All other components within normal limits  GLUCOSE, CAPILLARY - Abnormal; Notable for the following components:   Glucose-Capillary 284 (*)    All other components within normal limits  CBG MONITORING, ED - Abnormal; Notable for the following components:   Glucose-Capillary 446 (*)    All other components within normal limits  TROPONIN I    EKG EKG Interpretation  Date/Time:  Monday August 16 2018 17:49:06 EST Ventricular Rate:  81 PR Interval:    QRS Duration: 100 QT Interval:  392 QTC Calculation: 455 R Axis:   68 Text Interpretation:  Atrial fibrillation Low voltage, extremity and precordial leads no significant change since Aug 10 2018 Confirmed by Sherwood Gambler [id number]) on 08/17/2018 10:00:51 AM   Radiology Dg Chest 2  View  Result Date: 08/16/2018 CLINICAL DATA:  Acute cough and shortness of breath. EXAM: CHEST - 2 VIEW COMPARISON:  08/10/2018 and prior radiographs FINDINGS: Cardiomegaly again noted. RIGHT pleural effusion and RIGHT basilar atelectasis again noted with slightly improved RIGHT basilar aeration. There is no evidence of pneumothorax. No acute bony abnormalities identified. IMPRESSION: Slightly improved RIGHT basilar aeration since 08/10/2018 with continued RIGHT pleural effusion and RIGHT basilar atelectasis. Cardiomegaly Electronically Signed   By: Margarette Canada M.D.   On: 08/16/2018 18:51    Procedures Procedures (including critical care time)  Medications Ordered in ED Medications  insulin aspart (novoLOG) 100 UNIT/ML injection (has no administration in time range)  sodium chloride flush (NS) 0.9 % injection 3 mL (3 mLs Intravenous Given 08/17/18 1206)  0.9 %  sodium chloride infusion ( Intravenous New Bag/Given 08/16/18 2341)  acetaminophen (TYLENOL) tablet 650 mg (has no administration in time range)    Or  acetaminophen (TYLENOL) suppository 650 mg (has no administration in time range)  ondansetron (ZOFRAN) tablet 4 mg (has no administration in time range)    Or  ondansetron (ZOFRAN) injection 4 mg (has no administration in time range)  albuterol (PROVENTIL) (2.5 MG/3ML) 0.083%  nebulizer solution 2.5 mg (has no administration in time range)  ipratropium-albuterol (DUONEB) 0.5-2.5 (3) MG/3ML nebulizer solution 3 mL (3 mLs Nebulization Given 08/17/18 1456)  insulin aspart (novoLOG) injection 0-20 Units (7 Units Subcutaneous Given 08/17/18 1219)  insulin aspart (novoLOG) injection 0-5 Units (5 Units Subcutaneous Given 08/16/18 2209)  apixaban (ELIQUIS) tablet 5 mg (5 mg Oral Given 08/17/18 1205)  buPROPion (WELLBUTRIN XL) 24 hr tablet 300 mg (300 mg Oral Given 08/17/18 1205)  diltiazem (CARDIZEM CD) 24 hr capsule 240 mg (240 mg Oral Given 08/17/18 1205)  docusate sodium (COLACE) capsule  100 mg (has no administration in time range)  metoprolol tartrate (LOPRESSOR) tablet 50 mg (50 mg Oral Given 08/17/18 1206)  umeclidinium bromide (INCRUSE ELLIPTA) 62.5 MCG/INH 2 puff (2 puffs Inhalation Not Given 08/17/18 1055)  traZODone (DESYREL) tablet 100 mg (has no administration in time range)  ipratropium-albuterol (DUONEB) 0.5-2.5 (3) MG/3ML nebulizer solution (  Not Given 08/16/18 2120)  predniSONE (DELTASONE) tablet 30 mg (30 mg Oral Given 08/17/18 0815)    Followed by  predniSONE (DELTASONE) tablet 20 mg (has no administration in time range)    Followed by  predniSONE (DELTASONE) tablet 10 mg (has no administration in time range)  insulin aspart (novoLOG) injection 10 Units (10 Units Intravenous Given 08/16/18 2004)  sodium chloride 0.9 % bolus 500 mL (0 mLs Intravenous Stopped 08/16/18 2107)     Initial Impression / Assessment and Plan / ED Course  I have reviewed the triage vital signs and the nursing notes.  Pertinent labs & imaging results that were available during my care of the patient were reviewed by me and considered in my medical decision making (see chart for details).     Patient states she has been urinating constantly.  Suspect she is hyperglycemic due to recent steroid use.  She likely has become intravascularly dehydrated.  Have discussed with hospitalist regarding readmission.  Given small fluid bolus and IV insulin.  Final Clinical Impressions(s) / ED Diagnoses   Final diagnoses:  Dehydration  Hyperglycemia    ED Discharge Orders    None       Julianne Rice, MD 08/17/18 1720

## 2018-08-16 NOTE — ED Notes (Signed)
Spoke with Dr. Posey Pronto about pt's cbg being 446, MD stated to give the 5 units subcutaneous of insulin aspart

## 2018-08-17 DIAGNOSIS — J449 Chronic obstructive pulmonary disease, unspecified: Secondary | ICD-10-CM | POA: Diagnosis not present

## 2018-08-17 DIAGNOSIS — I10 Essential (primary) hypertension: Secondary | ICD-10-CM

## 2018-08-17 DIAGNOSIS — I5032 Chronic diastolic (congestive) heart failure: Secondary | ICD-10-CM | POA: Diagnosis not present

## 2018-08-17 DIAGNOSIS — E1165 Type 2 diabetes mellitus with hyperglycemia: Principal | ICD-10-CM

## 2018-08-17 DIAGNOSIS — E860 Dehydration: Secondary | ICD-10-CM | POA: Diagnosis not present

## 2018-08-17 DIAGNOSIS — I4891 Unspecified atrial fibrillation: Secondary | ICD-10-CM

## 2018-08-17 LAB — BASIC METABOLIC PANEL
Anion gap: 8 (ref 5–15)
BUN: 21 mg/dL (ref 8–23)
CHLORIDE: 103 mmol/L (ref 98–111)
CO2: 28 mmol/L (ref 22–32)
CREATININE: 0.89 mg/dL (ref 0.44–1.00)
Calcium: 8.3 mg/dL — ABNORMAL LOW (ref 8.9–10.3)
GFR calc Af Amer: >60 mL/min (ref >60)
GFR calc non Af Amer: >60 mL/min (ref >60)
Glucose, Bld: 314 mg/dL — ABNORMAL HIGH (ref 70–99)
Potassium: 3.7 mmol/L (ref 3.5–5.1)
Sodium: 139 mmol/L (ref 135–145)

## 2018-08-17 LAB — CBC
HCT: 31.9 % — ABNORMAL LOW (ref 36.0–46.0)
Hemoglobin: 9.4 g/dL — ABNORMAL LOW (ref 12.0–15.0)
MCH: 27 pg (ref 26.0–34.0)
MCHC: 29.5 g/dL — ABNORMAL LOW (ref 30.0–36.0)
MCV: 91.7 fL (ref 80.0–100.0)
Platelets: 222 10*3/uL (ref 150–400)
RBC: 3.48 MIL/uL — ABNORMAL LOW (ref 3.87–5.11)
RDW: 17.2 % — ABNORMAL HIGH (ref 11.5–15.5)
WBC: 9.8 10*3/uL (ref 4.0–10.5)
nRBC: 0 % (ref 0.0–0.2)

## 2018-08-17 LAB — BLOOD GAS, ARTERIAL
Acid-Base Excess: 4.8 mmol/L — ABNORMAL HIGH (ref 0.0–2.0)
Bicarbonate: 28.6 mmol/L — ABNORMAL HIGH (ref 20.0–28.0)
FIO2: 0.21
O2 Saturation: 95.2 %
Patient temperature: 37
pCO2 arterial: 44 mmHg (ref 32.0–48.0)
pH, Arterial: 7.434 (ref 7.350–7.450)
pO2, Arterial: 78.1 mmHg — ABNORMAL LOW (ref 83.0–108.0)

## 2018-08-17 LAB — GLUCOSE, CAPILLARY
Glucose-Capillary: 273 mg/dL — ABNORMAL HIGH (ref 70–99)
Glucose-Capillary: 209 mg/dL — ABNORMAL HIGH (ref 70–99)
Glucose-Capillary: 284 mg/dL — ABNORMAL HIGH (ref 70–99)
Glucose-Capillary: 207 mg/dL — ABNORMAL HIGH (ref 70–99)

## 2018-08-17 MED ORDER — FUROSEMIDE 40 MG PO TABS
40.0000 mg | ORAL_TABLET | Freq: Every day | ORAL | Status: DC
  Administered 2018-08-18 – 2018-08-24 (×7): 40 mg via ORAL
  Filled 2018-08-17 (×7): qty 1

## 2018-08-17 NOTE — NC FL2 (Signed)
[place] LEVEL OF CARE SCREENING TOOL     IDENTIFICATION  Patient Name: [name] Birthdate: Jan 21, 1951 Sex: female Admission Date (Current Location): 08/16/2018  [facility] and [place] Number:  [employer] and Address:  [place] [address] [place]      Provider Number: [id number]  Attending Physician Name and Address:  Kathie Dike, MD  Relative Name and Phone Number:       Current Level of Care: Other (Comment)(observation) Recommended Level of Care: [place] Prior Approval Number:    Date Approved/Denied:   PASRR Number: [id number])  Discharge Plan: SNF    Current Diagnoses: Patient Active Problem List   Diagnosis Date Noted  . Dehydration 08/16/2018  . Acute on chronic diastolic CHF (congestive heart failure) ([place]) 08/10/2018  . Acute lower UTI 08/10/2018  . Hypokalemia 08/10/2018  . COPD (chronic obstructive pulmonary disease) ([place]) 08/10/2018  . Thyroid lesion 08/10/2018  . Closed fracture of right ankle 06/10/2018  . Closed fracture of left tibia and fibula with routine healing 04/13/18 06/10/2018  . Unspecified atrial fibrillation ([place]) 04/15/2018  . Chronic diastolic CHF (congestive heart failure) ([place]) 04/15/2018  . CKD (chronic kidney disease), stage III ([place]) 04/15/2018  . Closed right fibular fracture 04/15/2018  . Left tibial fracture 04/14/2018  . CHF exacerbation ([place]) 04/10/2018  . SOB (shortness of breath)   . Acute CHF (congestive heart failure) ([place]) 04/01/2018  . Atrial fibrillation with RVR ([place]) 04/01/2018  . [place] 03/11/2016  . Muscle spasms of lower extremity 03/04/2016  . Acute renal insufficiency 02/26/2016  . History of MRSA infection 02/26/2016  . Diabetes type 2, uncontrolled ([place]) 02/19/2016  . HTN (hypertension) 02/19/2016  . Fracture, femur, distal ([place]) 02/19/2016  . Fall 02/19/2016  . Depression 02/19/2016  . Closed left femoral  fracture, initial encounter 02/19/2016  . Femur fracture, left ([place]) 02/19/2016    Orientation RESPIRATION BLADDER Height & Weight     Self, Time, Situation, Place  Normal Continent Weight: 236 lb 1.8 oz (107.1 kg) Height:  5' 4"  (162.6 cm)  BEHAVIORAL SYMPTOMS/MOOD NEUROLOGICAL BOWEL NUTRITION STATUS      Continent Diet(heart healthy/carb modified)  AMBULATORY STATUS COMMUNICATION OF NEEDS Skin   Limited Assist Verbally Normal                       Personal Care Assistance Level of Assistance  Bathing, Feeding, Dressing Bathing Assistance: Limited assistance Feeding assistance: Independent Dressing Assistance: Limited assistance     Functional Limitations Info  Sight, Hearing, Speech Sight Info: Adequate Hearing Info: Adequate Speech Info: Adequate    SPECIAL CARE FACTORS FREQUENCY  PT (By licensed PT)     PT Frequency: 5x/week              Contractures Contractures Info: Not present    Additional Factors Info  Code Status, Allergies, Psychotropic Code Status Info: Full Code Allergies Info: Latex Psychotropic Info: Wellbutrin, Desyrel         Current Medications (08/17/2018):  This is the current hospital active medication list Current Facility-Administered Medications  Medication Dose Route Frequency Provider Last Rate Last Dose  . acetaminophen (TYLENOL) tablet 650 mg  650 mg Oral Q6H PRN Lenore Cordia, MD       Or  . acetaminophen (TYLENOL) suppository 650 mg  650 mg Rectal Q6H PRN Zada Finders R, MD      . albuterol (PROVENTIL) (2.5 MG/3ML) 0.083% nebulizer solution  2.5 mg  2.5 mg Nebulization Q2H PRN Zada Finders R, MD      . apixaban (ELIQUIS) tablet 5 mg  5 mg Oral BID Lenore Cordia, MD   5 mg at 08/17/18 1205  . buPROPion (WELLBUTRIN XL) 24 hr tablet 300 mg  300 mg Oral Daily Zada Finders R, MD   300 mg at 08/17/18 1205  . diltiazem (CARDIZEM CD) 24 hr capsule 240 mg  240 mg Oral Daily Zada Finders R, MD   240 mg at 08/17/18 1205  .  docusate sodium (COLACE) capsule 100 mg  100 mg Oral BID PRN Zada Finders R, MD      . insulin aspart (novoLOG) injection 0-20 Units  0-20 Units Subcutaneous TID WC Lenore Cordia, MD   7 Units at 08/17/18 1219  . insulin aspart (novoLOG) injection 0-5 Units  0-5 Units Subcutaneous QHS Lenore Cordia, MD   5 Units at 08/16/18 2209  . ipratropium-albuterol (DUONEB) 0.5-2.5 (3) MG/3ML nebulizer solution 3 mL  3 mL Nebulization Q6H Zada Finders R, MD   3 mL at 08/17/18 1456  . metoprolol tartrate (LOPRESSOR) tablet 50 mg  50 mg Oral BID Lenore Cordia, MD   50 mg at 08/17/18 1206  . ondansetron (ZOFRAN) tablet 4 mg  4 mg Oral Q6H PRN Lenore Cordia, MD       Or  . ondansetron (ZOFRAN) injection 4 mg  4 mg Intravenous Q6H PRN Zada Finders R, MD      . predniSONE (DELTASONE) tablet 30 mg  30 mg Oral Q breakfast Zada Finders R, MD   30 mg at 08/17/18 0815   Followed by  . [START ON 08/19/2018] predniSONE (DELTASONE) tablet 20 mg  20 mg Oral Q breakfast Lenore Cordia, MD       Followed by  . [START ON 08/21/2018] predniSONE (DELTASONE) tablet 10 mg  10 mg Oral Q breakfast Zada Finders R, MD      . sodium chloride flush (NS) 0.9 % injection 3 mL  3 mL Intravenous Q12H Zada Finders R, MD   3 mL at 08/17/18 1206  . traZODone (DESYREL) tablet 100 mg  100 mg Oral QHS PRN Zada Finders R, MD      . umeclidinium bromide (INCRUSE ELLIPTA) 62.5 MCG/INH 2 puff  2 puff Inhalation Daily Lenore Cordia, MD         Discharge Medications: Please see discharge summary for a list of discharge medications.  Relevant Imaging Results:  Relevant Lab Results:   Additional Information SSN: [id number]  Ihor Gully, LCSW

## 2018-08-17 NOTE — Plan of Care (Signed)
  Problem: Acute Rehab PT Goals(only PT should resolve) Goal: Pt Will Go Supine/Side To Sit Flowsheets (Taken 08/17/2018 1209) Pt will go Supine/Side to Sit: with min guard assist Goal: Patient Will Transfer Sit To/From Stand Flowsheets (Taken 08/17/2018 1209) Patient will transfer sit to/from stand: with minimal assist Goal: Pt Will Transfer Bed To Chair/Chair To Bed Flowsheets (Taken 08/17/2018 1209) Pt will Transfer Bed to Chair/Chair to Bed: with min assist Goal: Pt Will Ambulate Flowsheets (Taken 08/17/2018 1209) Pt will Ambulate: 15 feet; with moderate assist; with rolling walker   12:10 PM, 08/17/18 Lonell Grandchild, MPT Physical Therapist with [facility] [phone] office [phone] mobile phone

## 2018-08-17 NOTE — Progress Notes (Signed)
Inpatient Diabetes Program Recommendations  AACE/ADA: New Consensus Statement on Inpatient Glycemic Control (2015)  Target Ranges:  Prepandial:   less than 140 mg/dL      Peak postprandial:   less than 180 mg/dL (1-2 hours)      Critically ill patients:  140 - 180 mg/dL   Results for [name], [name] (MRN [id number]) as of 08/17/2018 07:27  Ref. Range 08/16/2018 21:15 08/16/2018 23:39  Glucose-Capillary Latest Ref Range: 70 - 99 mg/dL 446 (H)  10 units NOVOLOG Given at 8pm 375 (H)  5 units NOVOLOG Given at 10pm   Results for [name], [name] (MRN [id number]) as of 08/17/2018 07:41  Ref. Range 08/17/2018 05:48  Glucose Latest Ref Range: 70 - 99 mg/dL 314 (H)    Admit with: Dehydration/ COPD  History: DM, CHF, COPD  Home DM Meds: Glipizide 10 mg BID       Actos 45 mg Daily  Current Orders: Novolog Resistant Correction Scale/ SSI (0-20 units) TID AC + HS      Recent Prednisone taper at home.  Now Getting Prednisone 30 mg daily.     MD- May consider the following in-hospital insulin adjustments while patient getting steroids and home DM meds are on hold:  1. Start Lantus 10 units Daily (0.1 units/kg dosing based on weight of 107 kg)  2. Start Novolog Meal Coverage: Novolog 4 units TID with meals  (Please add the following Hold Parameters: Hold if pt eats <50% of meal, Hold if pt NPO)     --Will follow patient during hospitalization--  Wyn Quaker RN, MSN, CDE Diabetes Coordinator Inpatient Glycemic Control Team Team Pager: [phone] (8a-5p)

## 2018-08-17 NOTE — Evaluation (Signed)
Physical Therapy Evaluation Patient Details Name: [name] MRN: [id number] DOB: 1951-06-16 Today's Date: 08/17/2018   History of Present Illness  [name] is a 67 y.o. female with medical history significant for chronic diastolic CHF EF 41-32%, atrial fibrillation and LV thrombus on Eliquis, type 2 diabetes, COPD, hypertension, and depression who presents to the ED with 2 days of shortness of breath with wheezing, dry mouth and throat, and generalized weakness.    Clinical Impression  Patient demonstrates slow labored movement for sitting up at bedside, sit to stands, transfers and taking steps with RW requiring assistance due to generalized weakness and poor standing balance.  Patient able to take a few unsteady labored steps to transfer to chair and tolerated sitting up after therapy - RN notified.  Patient will benefit from continued physical therapy in hospital and recommended venue below to increase strength, balance, endurance for safe ADLs and gait.    Follow Up Recommendations SNF    Equipment Recommendations       Recommendations for Other Services       Precautions / Restrictions Precautions Precautions: Fall Restrictions Weight Bearing Restrictions: No      Mobility  Bed Mobility Overal bed mobility: Needs Assistance Bed Mobility: Supine to Sit     Supine to sit: Min assist;Mod assist     General bed mobility comments: with bed flat  Transfers Overall transfer level: Needs assistance Equipment used: Rolling walker (2 wheeled) Transfers: Sit to/from [facility] Sit to Stand: Min assist;Mod assist Stand pivot transfers: Min assist;Mod assist       General transfer comment: very unsteady labored movement  Ambulation/Gait Ambulation/Gait assistance: Mod assist Gait Distance (Feet): 4 Feet Assistive device: Rolling walker (2 wheeled) Gait Pattern/deviations: Decreased step length - right;Decreased step length - left;Decreased  stride length Gait velocity: slow   General Gait Details: limited to 5-6 slow unsteady labored steps, limited mostly due to c/o fatigue  Stairs            Wheelchair Mobility    Modified Rankin (Stroke Patients Only)       Balance Overall balance assessment: Needs assistance Sitting-balance support: Feet supported;No upper extremity supported Sitting balance-Leahy Scale: Good     Standing balance support: Bilateral upper extremity supported;During functional activity Standing balance-Leahy Scale: Fair Standing balance comment: using RW                             Pertinent Vitals/Pain Pain Assessment: No/denies pain    Home Living Family/patient expects to be discharged to:: Private residence Living Arrangements: Spouse/significant other Available Help at Discharge: Family;Available PRN/intermittently Type of Home: House Home Access: Ramped entrance     Home Layout: One level Home Equipment: Walker - standard;Bedside commode;Grab bars - tub/shower;Shower seat;Wheelchair - manual;Tub bench;Walker - 2 wheels      Prior Function Level of Independence: Needs assistance   Gait / Transfers Assistance Needed: Assisted for bed mobility, transfers to wheelchair using RW, and non-ambulatory since bilateral ankle fractures, uses hoyar lift occasionlly to be put back to bed after sitting up in w/c  ADL's / Homemaking Assistance Needed: assisted by spouse        Hand Dominance   Dominant Hand: Right    Extremity/Trunk Assessment   Upper Extremity Assessment Upper Extremity Assessment: Generalized weakness    Lower Extremity Assessment Lower Extremity Assessment: Generalized weakness    Cervical / Trunk Assessment Cervical / Trunk Assessment: Kyphotic  Communication   Communication: No difficulties  Cognition Arousal/Alertness: Awake/alert Behavior During Therapy: WFL for tasks assessed/performed Overall Cognitive Status: Within Functional  Limits for tasks assessed                                        General Comments      Exercises     Assessment/Plan    PT Assessment Patient needs continued PT services  PT Problem List Decreased strength;Decreased activity tolerance;Decreased balance;Decreased mobility       PT Treatment Interventions Gait training;Functional mobility training;Therapeutic activities;Patient/family education;Therapeutic exercise    PT Goals (Current goals can be found in the Care Plan section)  Acute Rehab PT Goals Patient Stated Goal: return home after rehab PT Goal Formulation: With patient Time For Goal Achievement: 08/27/18 Potential to Achieve Goals: Good    Frequency Min 3X/week   Barriers to discharge        Co-evaluation               AM-PAC PT "6 Clicks" Mobility  Outcome Measure Help needed turning from your back to your side while in a flat bed without using bedrails?: Total Help needed moving from lying on your back to sitting on the side of a flat bed without using bedrails?: Total Help needed moving to and from a bed to a chair (including a wheelchair)?: Total Help needed standing up from a chair using your arms (e.g., wheelchair or bedside chair)?: A Lot Help needed to walk in hospital room?: A Lot Help needed climbing 3-5 steps with a railing? : Total 6 Click Score: 8    End of Session Equipment Utilized During Treatment: Gait belt Activity Tolerance: Patient tolerated treatment well;Patient limited by fatigue Patient left: in chair;with call bell/phone within reach Nurse Communication: Mobility status PT Visit Diagnosis: Unsteadiness on feet (R26.81);Other abnormalities of gait and mobility (R26.89);Muscle weakness (generalized) (M62.81)    Time: 4621-9471 PT Time Calculation (min) (ACUTE ONLY): 25 min   Charges:   PT Evaluation $PT Eval Moderate Complexity: 1 Mod PT Treatments $Therapeutic Activity: 23-37 mins        12:08 PM,  08/17/18 Lonell Grandchild, MPT Physical Therapist with [facility] [phone] office [phone] mobile phone

## 2018-08-17 NOTE — Progress Notes (Signed)
PROGRESS NOTE    [name]  ALP:[id number] DOB: [birth year] DOA: 08/16/2018 PCP: [name], MD    Brief Narrative:  67 year old female with a history of diastolic CHF, atrial fibrillation on anticoagulation, COPD, hypertension, diabetes, was recently in the hospital after being treated for CHF/COPD exacerbation.  She returned to the hospital with hyperglycemia secondary to steroids and persistent wheezing.  She was admitted to the hospital started on sliding scale insulin.  She is continued on prednisone taper for COPD.  Volume status appears to be compensated.  Seen by physical therapy with recommendations for skilled nursing facility placement   Assessment & Plan:   Principal Problem:   Dehydration Active Problems:   Diabetes type 2, uncontrolled (HCC)   HTN (hypertension)   Depression   Unspecified atrial fibrillation (HCC)   Chronic diastolic CHF (congestive heart failure) (HCC)   COPD (chronic obstructive pulmonary disease) ([place])   1. Uncontrolled diabetes with hyperglycemia.  Related to recent steroids.  Improved with initiation of sliding scale insulin and gentle hydration. 2. COPD.  Still has some diffuse wheezing, but appears to be breathing comfortably on room air.  Continue bronchodilators on prednisone taper. 3. Chronic atrial fibrillation.  Continue rate control medications.  Anticoagulated with apixaban. 4. Chronic diastolic CHF.  Appears to be compensated at this time.  Resume oral Lasix. 5. History of LV thrombus.  Continue anticoagulation with apixaban. 6. Hypertension.  Blood pressure currently stable.  Continue on Lopressor and diltiazem 7. Lethargy.  Patient feels that she is increasingly sleepy and having difficulty stay awake.  Will discontinue trazodone.  Check ABG to ensure she is not retaining CO2. 8. Generalized weakness/deconditioning.  Will need skilled nursing facility placement on discharge.   DVT prophylaxis: Apixaban Code Status:  DNR Family Communication: No family present Disposition Plan: Skilled nursing facility placement   Consultants:     Procedures:     Antimicrobials:       Subjective: Says she feels very tired.  Has trouble staying awake.  Breathing is better.  Objective: Vitals:   08/17/18 0626 08/17/18 0742 08/17/18 1342 08/17/18 1456  BP: (!) 119/48  (!) 116/57   Pulse: 66  71   Resp: 18  18   Temp: (!) 97.5 F (36.4 C)  98 F (36.7 C)   TempSrc: Oral  Oral   SpO2: 100% 96% 98% 96%  Weight:      Height:        Intake/Output Summary (Last 24 hours) at 08/17/2018 2012 Last data filed at 08/17/2018 1813 Gross per 24 hour  Intake 847.61 ml  Output 200 ml  Net 647.61 ml   Filed Weights   08/16/18 1741 08/17/18 0500  Weight: 107 kg 107.1 kg    Examination:  General exam: Appears calm and comfortable  Respiratory system: Bilateral wheezing. Respiratory effort normal. Cardiovascular system: S1 & S2 heard, irregular. No JVD, murmurs, rubs, gallops or clicks. No pedal edema. Gastrointestinal system: Abdomen is nondistended, soft and nontender. No organomegaly or masses felt. Normal bowel sounds heard. Central nervous system: No focal neurological deficits. Extremities: Symmetric 5 x 5 power. Skin: No rashes, lesions or ulcers Psychiatry: Judgement and insight appear normal. Mood & affect appropriate.     Data Reviewed: I have personally reviewed following labs and imaging studies  CBC: Recent Labs  Lab 08/16/18 1848 08/17/18 0548  WBC 9.3 9.8  NEUTROABS 8.6*  --   HGB 9.9* 9.4*  HCT 34.0* 31.9*  MCV 90.7 91.7  PLT 260 222  Basic Metabolic Panel: Recent Labs  Lab 08/11/18 0435 08/12/18 0434 08/13/18 0349 08/14/18 0514 08/16/18 1848 08/17/18 0548  NA 138 136 137 136 135 139  K 3.8 4.5 4.9 4.1 4.1 3.7  CL 105 105 103 101 100 103  CO2 24 22 23 27 26 28   GLUCOSE 143* 161* 239* 259* 508* 314*  BUN 13 16 18  24* 25* 21  CREATININE 0.83 1.04* 1.06* 1.04* 1.03*  0.89  CALCIUM 7.9* 8.2* 8.7* 8.5* 8.5* 8.3*  MG 1.8  --   --   --   --   --    GFR: Estimated Creatinine Clearance: 73.3 mL/min (by C-G formula based on SCr of 0.89 mg/dL). Liver Function Tests: Recent Labs  Lab 08/16/18 1848  AST 24  ALT 17  ALKPHOS 51  BILITOT 0.7  PROT 6.6  ALBUMIN 3.1*   No results for input(s): LIPASE, AMYLASE in the last 168 hours. No results for input(s): AMMONIA in the last 168 hours. Coagulation Profile: No results for input(s): INR, PROTIME in the last 168 hours. Cardiac Enzymes: Recent Labs  Lab 08/10/18 2229 08/11/18 0435 08/16/18 1848  TROPONINI <0.03 <0.03 <0.03   BNP (last 3 results) No results for input(s): PROBNP in the last 8760 hours. HbA1C: No results for input(s): HGBA1C in the last 72 hours. CBG: Recent Labs  Lab 08/16/18 2115 08/16/18 2339 08/17/18 0800 08/17/18 1120 08/17/18 1708  GLUCAP 446* 375* 273* 209* 284*   Lipid Profile: No results for input(s): CHOL, HDL, LDLCALC, TRIG, CHOLHDL, LDLDIRECT in the last 72 hours. Thyroid Function Tests: No results for input(s): TSH, T4TOTAL, FREET4, T3FREE, THYROIDAB in the last 72 hours. Anemia Panel: No results for input(s): VITAMINB12, FOLATE, FERRITIN, TIBC, IRON, RETICCTPCT in the last 72 hours. Sepsis Labs: No results for input(s): PROCALCITON, LATICACIDVEN in the last 168 hours.  Recent Results (from the past 240 hour(s))  Urine culture     Status: Abnormal   Collection Time: 08/10/18  8:14 AM  Result Value Ref Range Status   Specimen Description   Final    URINE, CLEAN CATCH Performed at [facility], [address] [place], [place] [address]    Special Requests   Final    NONE Performed at [facility], [address] [place], [place] [address]    Culture (A)  Final    >=100,000 COLONIES/mL ESCHERICHIA COLI >=100,000 COLONIES/mL KLEBSIELLA PNEUMONIAE    Report Status 08/13/2018 FINAL  Final   Organism ID, Bacteria ESCHERICHIA COLI (A)  Final   Organism ID,  Bacteria KLEBSIELLA PNEUMONIAE (A)  Final      Susceptibility   Escherichia coli - MIC*    AMPICILLIN 4 SENSITIVE Sensitive     CEFAZOLIN <=4 SENSITIVE Sensitive     CEFTRIAXONE <=1 SENSITIVE Sensitive     CIPROFLOXACIN <=0.25 SENSITIVE Sensitive     GENTAMICIN <=1 SENSITIVE Sensitive     IMIPENEM <=0.25 SENSITIVE Sensitive     NITROFURANTOIN <=16 SENSITIVE Sensitive     TRIMETH/SULFA <=20 SENSITIVE Sensitive     AMPICILLIN/SULBACTAM <=2 SENSITIVE Sensitive     PIP/TAZO <=4 SENSITIVE Sensitive     Extended ESBL NEGATIVE Sensitive     * >=100,000 COLONIES/mL ESCHERICHIA COLI   Klebsiella pneumoniae - MIC*    AMPICILLIN >=32 RESISTANT Resistant     CEFAZOLIN <=4 SENSITIVE Sensitive     CEFTRIAXONE <=1 SENSITIVE Sensitive     CIPROFLOXACIN <=0.25 SENSITIVE Sensitive     GENTAMICIN <=1 SENSITIVE Sensitive     IMIPENEM <=0.25 SENSITIVE  Sensitive     NITROFURANTOIN 64 INTERMEDIATE Intermediate     TRIMETH/SULFA <=20 SENSITIVE Sensitive     AMPICILLIN/SULBACTAM 8 SENSITIVE Sensitive     PIP/TAZO <=4 SENSITIVE Sensitive     Extended ESBL NEGATIVE Sensitive     * >=100,000 COLONIES/mL KLEBSIELLA PNEUMONIAE  MRSA PCR Screening     Status: Abnormal   Collection Time: 08/10/18  2:02 PM  Result Value Ref Range Status   MRSA by PCR POSITIVE (A) NEGATIVE Final    Comment:        The GeneXpert MRSA Assay (FDA approved for NASAL specimens only), is one component of a comprehensive MRSA colonization surveillance program. It is not intended to diagnose MRSA infection nor to guide or monitor treatment for MRSA infections. RESULT CALLED TO, READ BACK BY AND VERIFIED WITH: CUMMINGS,R @ 0017 ON 08/11/18 BY JUW Performed at [facility], [address] [place], [place] [address]          Radiology Studies: Dg Chest 2 View  Result Date: 08/16/2018 CLINICAL DATA:  Acute cough and shortness of breath. EXAM: CHEST - 2 VIEW COMPARISON:  08/10/2018 and prior radiographs FINDINGS:  Cardiomegaly again noted. RIGHT pleural effusion and RIGHT basilar atelectasis again noted with slightly improved RIGHT basilar aeration. There is no evidence of pneumothorax. No acute bony abnormalities identified. IMPRESSION: Slightly improved RIGHT basilar aeration since 08/10/2018 with continued RIGHT pleural effusion and RIGHT basilar atelectasis. Cardiomegaly Electronically Signed   By: Margarette Canada M.D.   On: 08/16/2018 18:51        Scheduled Meds: . apixaban  5 mg Oral BID  . buPROPion  300 mg Oral Daily  . diltiazem  240 mg Oral Daily  . insulin aspart  0-20 Units Subcutaneous TID WC  . insulin aspart  0-5 Units Subcutaneous QHS  . ipratropium-albuterol  3 mL Nebulization Q6H  . metoprolol tartrate  50 mg Oral BID  . predniSONE  30 mg Oral Q breakfast   Followed by  . [START ON 08/19/2018] predniSONE  20 mg Oral Q breakfast   Followed by  . [START ON 08/21/2018] predniSONE  10 mg Oral Q breakfast  . sodium chloride flush  3 mL Intravenous Q12H  . umeclidinium bromide  2 puff Inhalation Daily   Continuous Infusions:   LOS: 0 days    Time spent: 35 minutes    Kathie Dike, MD [facility] Pager [phone]  If 7PM-7AM, please contact night-coverage [email or web address] Password [email or web address] 08/17/2018, 8:12 PM

## 2018-08-17 NOTE — Care Management Obs Status (Signed)
[place] NOTIFICATION   Patient Details  Name: [name] MRN: [id number] Date of Birth: February 08, 1951   Medicare Observation Status Notification Given:  Yes    Sherald Barge, RN 08/17/2018, 10:56 AM

## 2018-08-17 NOTE — Care Management (Signed)
Pt active with [facility]. Plan now for SNF placement. [name], [facility] rep, aware of obs status and DC plan.    As a LATE ENTRY: CM department received call from pt on 08/16/18 about pt not receiving neb machine after previous DC on 08/14/18. Neb machine was to be shipped to pt, order was not expedited d/t pt telling MD she had her husbands machine at home she could use until her's got there. CM contacted [facility] rep on 08/16/18 and asked that they reach out to pt to discuss.

## 2018-08-18 DIAGNOSIS — E860 Dehydration: Secondary | ICD-10-CM | POA: Diagnosis not present

## 2018-08-18 LAB — CBC
HCT: 31.7 % — ABNORMAL LOW (ref 36.0–46.0)
Hemoglobin: 9.2 g/dL — ABNORMAL LOW (ref 12.0–15.0)
MCH: 26.1 pg (ref 26.0–34.0)
MCHC: 29 g/dL — ABNORMAL LOW (ref 30.0–36.0)
MCV: 90.1 fL (ref 80.0–100.0)
Platelets: 238 10*3/uL (ref 150–400)
RBC: 3.52 MIL/uL — ABNORMAL LOW (ref 3.87–5.11)
RDW: 17.2 % — AB (ref 11.5–15.5)
WBC: 9.1 10*3/uL (ref 4.0–10.5)
nRBC: 0 % (ref 0.0–0.2)

## 2018-08-18 LAB — BASIC METABOLIC PANEL
Anion gap: 8 (ref 5–15)
BUN: 19 mg/dL (ref 8–23)
CO2: 27 mmol/L (ref 22–32)
CREATININE: 0.72 mg/dL (ref 0.44–1.00)
Calcium: 8.3 mg/dL — ABNORMAL LOW (ref 8.9–10.3)
Chloride: 104 mmol/L (ref 98–111)
GFR calc Af Amer: >60 mL/min (ref >60)
GFR calc non Af Amer: >60 mL/min (ref >60)
GLUCOSE: 157 mg/dL — AB (ref 70–99)
Potassium: 3.6 mmol/L (ref 3.5–5.1)
Sodium: 139 mmol/L (ref 135–145)

## 2018-08-18 LAB — GLUCOSE, CAPILLARY
Glucose-Capillary: 151 mg/dL — ABNORMAL HIGH (ref 70–99)
Glucose-Capillary: 158 mg/dL — ABNORMAL HIGH (ref 70–99)
Glucose-Capillary: 285 mg/dL — ABNORMAL HIGH (ref 70–99)
Glucose-Capillary: 296 mg/dL — ABNORMAL HIGH (ref 70–99)

## 2018-08-18 NOTE — Progress Notes (Signed)
PROGRESS NOTE    [name]  VZC:[id number] DOB: 04-18-1951 DOA: 08/16/2018 PCP: [name], MD   Brief Narrative:  68 year old female with a history of diastolic CHF, atrial fibrillation on anticoagulation, COPD, hypertension, diabetes, was recently in the hospital after being treated for CHF/COPD exacerbation.  She returned to the hospital with hyperglycemia secondary to steroids and persistent wheezing.  She was admitted to the hospital started on sliding scale insulin.  She is continued on prednisone taper for COPD.  Volume status appears to be compensated.  Seen by physical therapy with recommendations for skilled nursing facility placement   Assessment & Plan:   Principal Problem:   Dehydration Active Problems:   Diabetes type 2, uncontrolled (HCC)   HTN (hypertension)   Depression   Unspecified atrial fibrillation (HCC)   Chronic diastolic CHF (congestive heart failure) (HCC)   COPD (chronic obstructive pulmonary disease) ([place])  1. Uncontrolled diabetes with hyperglycemia-improved.  Related to recent steroids.  Improved with initiation of sliding scale insulin and gentle hydration. 2. COPD.  Still has some diffuse wheezing, but appears to be breathing comfortably on room air.  Continue bronchodilators on prednisone taper. 3. Chronic atrial fibrillation.  Continue rate control medications.  Anticoagulated with apixaban. 4. Chronic diastolic CHF.  Appears to be compensated at this time.  Resume oral Lasix. 5. History of LV thrombus.  Continue anticoagulation with apixaban. 6. Hypertension.  Blood pressure currently stable.  Continue on Lopressor and diltiazem 7. Lethargy.  Improving. Continue to hold trazodone. ABG with pCO2 of 71mHg. 8. Generalized weakness/deconditioning.  Will need skilled nursing facility placement on discharge.   DVT prophylaxis: Apixaban Code Status: DNR Family Communication: No family present Disposition Plan: Skilled nursing  facility placement hopefully in 24-48 hours.   Consultants:   None  Procedures:   None  Antimicrobials:   None   Subjective: Patient seen and evaluated today with no new acute complaints or concerns. No acute concerns or events noted overnight. She states she is breathing better and is more awake and alert today.  Objective: Vitals:   08/18/18 0733 08/18/18 0815 08/18/18 1403 08/18/18 1435  BP: (!) 153/85  118/71   Pulse: 72  83   Resp: 18  18   Temp: 97.6 F (36.4 C)  98 F (36.7 C)   TempSrc: Oral  Oral   SpO2: 96% 97% 96% 95%  Weight: 105.5 kg     Height:        Intake/Output Summary (Last 24 hours) at 08/18/2018 1456 Last data filed at 08/18/2018 1300 Gross per 24 hour  Intake 600 ml  Output 700 ml  Net -100 ml   Filed Weights   08/16/18 1741 08/17/18 0500 08/18/18 0733  Weight: 107 kg 107.1 kg 105.5 kg    Examination:  General exam: Appears calm and comfortable  Respiratory system: Clear to auscultation. Respiratory effort normal. Cardiovascular system: S1 & S2 heard, RRR. No JVD, murmurs, rubs, gallops or clicks. No pedal edema. Gastrointestinal system: Abdomen is nondistended, soft and nontender. No organomegaly or masses felt. Normal bowel sounds heard. Central nervous system: Alert and oriented. No focal neurological deficits. Extremities: Symmetric 5 x 5 power. Skin: No rashes, lesions or ulcers Psychiatry: Judgement and insight appear normal. Mood & affect appropriate.     Data Reviewed: I have personally reviewed following labs and imaging studies  CBC: Recent Labs  Lab 08/16/18 1848 08/17/18 0548 08/18/18 0414  WBC 9.3 9.8 9.1  NEUTROABS 8.6*  --   --  HGB 9.9* 9.4* 9.2*  HCT 34.0* 31.9* 31.7*  MCV 90.7 91.7 90.1  PLT 260 222 976   Basic Metabolic Panel: Recent Labs  Lab 08/13/18 0349 08/14/18 0514 08/16/18 1848 08/17/18 0548 08/18/18 0414  NA 137 136 135 139 139  K 4.9 4.1 4.1 3.7 3.6  CL 103 101 100 103 104  CO2 23 27  26 28 27   GLUCOSE 239* 259* 508* 314* 157*  BUN 18 24* 25* 21 19  CREATININE 1.06* 1.04* 1.03* 0.89 0.72  CALCIUM 8.7* 8.5* 8.5* 8.3* 8.3*   GFR: Estimated Creatinine Clearance: 80.8 mL/min (by C-G formula based on SCr of 0.72 mg/dL). Liver Function Tests: Recent Labs  Lab 08/16/18 1848  AST 24  ALT 17  ALKPHOS 51  BILITOT 0.7  PROT 6.6  ALBUMIN 3.1*   No results for input(s): LIPASE, AMYLASE in the last 168 hours. No results for input(s): AMMONIA in the last 168 hours. Coagulation Profile: No results for input(s): INR, PROTIME in the last 168 hours. Cardiac Enzymes: Recent Labs  Lab 08/16/18 1848  TROPONINI <0.03   BNP (last 3 results) No results for input(s): PROBNP in the last 8760 hours. HbA1C: No results for input(s): HGBA1C in the last 72 hours. CBG: Recent Labs  Lab 08/17/18 1120 08/17/18 1708 08/17/18 2216 08/18/18 0734 08/18/18 1109  GLUCAP 209* 284* 207* 151* 158*   Lipid Profile: No results for input(s): CHOL, HDL, LDLCALC, TRIG, CHOLHDL, LDLDIRECT in the last 72 hours. Thyroid Function Tests: No results for input(s): TSH, T4TOTAL, FREET4, T3FREE, THYROIDAB in the last 72 hours. Anemia Panel: No results for input(s): VITAMINB12, FOLATE, FERRITIN, TIBC, IRON, RETICCTPCT in the last 72 hours. Sepsis Labs: No results for input(s): PROCALCITON, LATICACIDVEN in the last 168 hours.  Recent Results (from the past 240 hour(s))  Urine culture     Status: Abnormal   Collection Time: 08/10/18  8:14 AM  Result Value Ref Range Status   Specimen Description   Final    URINE, CLEAN CATCH Performed at [facility], [address] [place], [place] [address]    Special Requests   Final    NONE Performed at [facility], [address], [place],  [address]    Culture (A)  Final    >=100,000 COLONIES/mL ESCHERICHIA COLI >=100,000 COLONIES/mL KLEBSIELLA PNEUMONIAE    Report Status 08/13/2018 FINAL  Final   Organism ID, Bacteria ESCHERICHIA COLI (A)   Final   Organism ID, Bacteria KLEBSIELLA PNEUMONIAE (A)  Final      Susceptibility   Escherichia coli - MIC*    AMPICILLIN 4 SENSITIVE Sensitive     CEFAZOLIN <=4 SENSITIVE Sensitive     CEFTRIAXONE <=1 SENSITIVE Sensitive     CIPROFLOXACIN <=0.25 SENSITIVE Sensitive     GENTAMICIN <=1 SENSITIVE Sensitive     IMIPENEM <=0.25 SENSITIVE Sensitive     NITROFURANTOIN <=16 SENSITIVE Sensitive     TRIMETH/SULFA <=20 SENSITIVE Sensitive     AMPICILLIN/SULBACTAM <=2 SENSITIVE Sensitive     PIP/TAZO <=4 SENSITIVE Sensitive     Extended ESBL NEGATIVE Sensitive     * >=100,000 COLONIES/mL ESCHERICHIA COLI   Klebsiella pneumoniae - MIC*    AMPICILLIN >=32 RESISTANT Resistant     CEFAZOLIN <=4 SENSITIVE Sensitive     CEFTRIAXONE <=1 SENSITIVE Sensitive     CIPROFLOXACIN <=0.25 SENSITIVE Sensitive     GENTAMICIN <=1 SENSITIVE Sensitive     IMIPENEM <=0.25 SENSITIVE Sensitive     NITROFURANTOIN 64 INTERMEDIATE Intermediate  TRIMETH/SULFA <=20 SENSITIVE Sensitive     AMPICILLIN/SULBACTAM 8 SENSITIVE Sensitive     PIP/TAZO <=4 SENSITIVE Sensitive     Extended ESBL NEGATIVE Sensitive     * >=100,000 COLONIES/mL KLEBSIELLA PNEUMONIAE  MRSA PCR Screening     Status: Abnormal   Collection Time: 08/10/18  2:02 PM  Result Value Ref Range Status   MRSA by PCR POSITIVE (A) NEGATIVE Final    Comment:        The GeneXpert MRSA Assay (FDA approved for NASAL specimens only), is one component of a comprehensive MRSA colonization surveillance program. It is not intended to diagnose MRSA infection nor to guide or monitor treatment for MRSA infections. RESULT CALLED TO, READ BACK BY AND VERIFIED WITH: CUMMINGS,R @ 0017 ON 08/11/18 BY JUW Performed at [facility], [address] [place], [place] [address]          Radiology Studies: Dg Chest 2 View  Result Date: 08/16/2018 CLINICAL DATA:  Acute cough and shortness of breath. EXAM: CHEST - 2 VIEW COMPARISON:  08/10/2018 and prior  radiographs FINDINGS: Cardiomegaly again noted. RIGHT pleural effusion and RIGHT basilar atelectasis again noted with slightly improved RIGHT basilar aeration. There is no evidence of pneumothorax. No acute bony abnormalities identified. IMPRESSION: Slightly improved RIGHT basilar aeration since 08/10/2018 with continued RIGHT pleural effusion and RIGHT basilar atelectasis. Cardiomegaly Electronically Signed   By: Margarette Canada M.D.   On: 08/16/2018 18:51        Scheduled Meds: . apixaban  5 mg Oral BID  . buPROPion  300 mg Oral Daily  . diltiazem  240 mg Oral Daily  . furosemide  40 mg Oral Daily  . insulin aspart  0-20 Units Subcutaneous TID WC  . insulin aspart  0-5 Units Subcutaneous QHS  . ipratropium-albuterol  3 mL Nebulization Q6H  . metoprolol tartrate  50 mg Oral BID  . [START ON 08/19/2018] predniSONE  20 mg Oral Q breakfast   Followed by  . [START ON 08/21/2018] predniSONE  10 mg Oral Q breakfast  . sodium chloride flush  3 mL Intravenous Q12H  . umeclidinium bromide  2 puff Inhalation Daily   Continuous Infusions:   LOS: 0 days    Time spent: 30 minutes    Kameshia Madruga Darleen Crocker, DO [facility] Pager [phone]  If 7PM-7AM, please contact night-coverage [email or web address] Password [email or web address] 08/18/2018, 2:56 PM

## 2018-08-19 DIAGNOSIS — E860 Dehydration: Secondary | ICD-10-CM | POA: Diagnosis not present

## 2018-08-19 DIAGNOSIS — I110 Hypertensive heart disease with heart failure: Secondary | ICD-10-CM | POA: Diagnosis present

## 2018-08-19 DIAGNOSIS — K224 Dyskinesia of esophagus: Secondary | ICD-10-CM | POA: Diagnosis not present

## 2018-08-19 DIAGNOSIS — N390 Urinary tract infection, site not specified: Secondary | ICD-10-CM | POA: Diagnosis present

## 2018-08-19 DIAGNOSIS — Z79899 Other long term (current) drug therapy: Secondary | ICD-10-CM | POA: Diagnosis not present

## 2018-08-19 DIAGNOSIS — M2550 Pain in unspecified joint: Secondary | ICD-10-CM | POA: Diagnosis not present

## 2018-08-19 DIAGNOSIS — E1165 Type 2 diabetes mellitus with hyperglycemia: Secondary | ICD-10-CM | POA: Diagnosis not present

## 2018-08-19 DIAGNOSIS — B373 Candidiasis of vulva and vagina: Secondary | ICD-10-CM | POA: Diagnosis present

## 2018-08-19 DIAGNOSIS — I959 Hypotension, unspecified: Secondary | ICD-10-CM | POA: Diagnosis not present

## 2018-08-19 DIAGNOSIS — Z833 Family history of diabetes mellitus: Secondary | ICD-10-CM | POA: Diagnosis not present

## 2018-08-19 DIAGNOSIS — T380X5A Adverse effect of glucocorticoids and synthetic analogues, initial encounter: Secondary | ICD-10-CM | POA: Diagnosis present

## 2018-08-19 DIAGNOSIS — F329 Major depressive disorder, single episode, unspecified: Secondary | ICD-10-CM | POA: Diagnosis present

## 2018-08-19 DIAGNOSIS — Z9049 Acquired absence of other specified parts of digestive tract: Secondary | ICD-10-CM | POA: Diagnosis not present

## 2018-08-19 DIAGNOSIS — J441 Chronic obstructive pulmonary disease with (acute) exacerbation: Secondary | ICD-10-CM | POA: Diagnosis present

## 2018-08-19 DIAGNOSIS — E861 Hypovolemia: Secondary | ICD-10-CM | POA: Diagnosis present

## 2018-08-19 DIAGNOSIS — Z7401 Bed confinement status: Secondary | ICD-10-CM | POA: Diagnosis not present

## 2018-08-19 DIAGNOSIS — Z66 Do not resuscitate: Secondary | ICD-10-CM | POA: Diagnosis present

## 2018-08-19 DIAGNOSIS — Z803 Family history of malignant neoplasm of breast: Secondary | ICD-10-CM | POA: Diagnosis not present

## 2018-08-19 DIAGNOSIS — R443 Hallucinations, unspecified: Secondary | ICD-10-CM | POA: Diagnosis not present

## 2018-08-19 DIAGNOSIS — Z7901 Long term (current) use of anticoagulants: Secondary | ICD-10-CM | POA: Diagnosis not present

## 2018-08-19 DIAGNOSIS — I482 Chronic atrial fibrillation, unspecified: Secondary | ICD-10-CM | POA: Diagnosis present

## 2018-08-19 DIAGNOSIS — Z91040 Latex allergy status: Secondary | ICD-10-CM | POA: Diagnosis not present

## 2018-08-19 DIAGNOSIS — Z87440 Personal history of urinary (tract) infections: Secondary | ICD-10-CM | POA: Diagnosis not present

## 2018-08-19 DIAGNOSIS — B9620 Unspecified Escherichia coli [E. coli] as the cause of diseases classified elsewhere: Secondary | ICD-10-CM | POA: Diagnosis present

## 2018-08-19 DIAGNOSIS — M810 Age-related osteoporosis without current pathological fracture: Secondary | ICD-10-CM | POA: Diagnosis present

## 2018-08-19 DIAGNOSIS — I5032 Chronic diastolic (congestive) heart failure: Secondary | ICD-10-CM | POA: Diagnosis present

## 2018-08-19 DIAGNOSIS — Z90710 Acquired absence of both cervix and uterus: Secondary | ICD-10-CM | POA: Diagnosis not present

## 2018-08-19 LAB — GLUCOSE, CAPILLARY
GLUCOSE-CAPILLARY: 265 mg/dL — AB (ref 70–99)
GLUCOSE-CAPILLARY: 232 mg/dL — AB (ref 70–99)
Glucose-Capillary: 123 mg/dL — ABNORMAL HIGH (ref 70–99)
Glucose-Capillary: 170 mg/dL — ABNORMAL HIGH (ref 70–99)

## 2018-08-19 LAB — BASIC METABOLIC PANEL
Anion gap: 9 (ref 5–15)
BUN: 19 mg/dL (ref 8–23)
CO2: 28 mmol/L (ref 22–32)
Calcium: 8.1 mg/dL — ABNORMAL LOW (ref 8.9–10.3)
Chloride: 100 mmol/L (ref 98–111)
Creatinine, Ser: 0.81 mg/dL (ref 0.44–1.00)
GFR calc Af Amer: >60 mL/min (ref >60)
GFR calc non Af Amer: >60 mL/min (ref >60)
Glucose, Bld: 309 mg/dL — ABNORMAL HIGH (ref 70–99)
Potassium: 3.7 mmol/L (ref 3.5–5.1)
SODIUM: 137 mmol/L (ref 135–145)

## 2018-08-19 LAB — AMMONIA: Ammonia: <9 umol/L — ABNORMAL LOW (ref 9–35)

## 2018-08-19 MED ORDER — GUAIFENESIN ER 600 MG PO TB12
600.0000 mg | ORAL_TABLET | Freq: Two times a day (BID) | ORAL | Status: DC
  Administered 2018-08-19 – 2018-08-22 (×7): 600 mg via ORAL
  Filled 2018-08-19 (×7): qty 1

## 2018-08-19 MED ORDER — INSULIN DETEMIR 100 UNIT/ML ~~LOC~~ SOLN
5.0000 [IU] | Freq: Two times a day (BID) | SUBCUTANEOUS | Status: DC
  Administered 2018-08-19 – 2018-08-20 (×4): 5 [IU] via SUBCUTANEOUS
  Filled 2018-08-19 (×9): qty 0.05

## 2018-08-19 NOTE — Progress Notes (Signed)
Inpatient Diabetes Program Recommendations  AACE/ADA: New Consensus Statement on Inpatient Glycemic Control (2015)  Target Ranges:  Prepandial:   less than 140 mg/dL      Peak postprandial:   less than 180 mg/dL (1-2 hours)      Critically ill patients:  140 - 180 mg/dL   Lab Results  Component Value Date   GLUCAP 232 (H) 08/19/2018   HGBA1C 8.3 (H) 04/14/2018    Review of Glycemic Control Results for [name], [name] (MRN [id number]) as of 08/19/2018 12:46  Ref. Range 08/18/2018 11:09 08/18/2018 16:37 08/18/2018 21:28 08/19/2018 07:42 08/19/2018 11:28  Glucose-Capillary Latest Ref Range: 70 - 99 mg/dL 158 (H) 285 (H) 296 (H) 265 (H) 232 (H)   Inpatient Diabetes Program Recommendations:    Noted Levemir started today. -Start Novolog Meal Coverage: Novolog 4 units TID with meals  Thank you, Bethena Roys E. Aimy Sweeting, RN, MSN, CDE  Diabetes Coordinator Inpatient Glycemic Control Team Team Pager [phone] (8am-5pm) 08/19/2018 12:46 PM

## 2018-08-19 NOTE — Progress Notes (Signed)
PROGRESS NOTE    [name]  WIO:[id number] DOB: 10/31/1950 DOA: 08/16/2018 PCP: [name], MD   Brief Narrative:   68 year old female with a history of diastolic CHF, atrial fibrillation on anticoagulation, COPD, hypertension, diabetes, was recently in the hospital after being treated for CHF/COPD exacerbation. She returned to the hospital with hyperglycemia secondary to steroids and persistent wheezing. She was admitted to the hospital started on sliding scale insulin. She is continued on prednisone taper for COPD. Volume status appears to be compensated. Seen by physical therapy with recommendations for skilled nursing facility placement. She continues to have some hallucinations this morning and sedation.  Assessment & Plan:   Principal Problem:   Dehydration Active Problems:   Diabetes type 2, uncontrolled (HCC)   HTN (hypertension)   Depression   Unspecified atrial fibrillation (HCC)   Chronic diastolic CHF (congestive heart failure) (HCC)   COPD (chronic obstructive pulmonary disease) ([place])  1. Uncontrolled diabetes with hyperglycemia-improved. Related to recent steroids. Improved with initiation of sliding scale insulin and gentle hydration. Added Levemir bid today. No more prednisone. 2. Acute encephalopathy with hallucinations and lethargy. Stopped steroids today. Hold Wellbutrin for now. Check ammonia level. Continue to hold trazodone. 3. COPD. Still has some diffuse wheezing, but appears to be breathing comfortably on room air. Continue bronchodilators and stop prednisone. Mucinex added today. 4. Chronic atrial fibrillation. Continue rate control medications. Anticoagulated with apixaban. 5. Chronic diastolic CHF. Appears to be compensated at this time. Resume oral Lasix. 6. History of LV thrombus. Continue anticoagulation with apixaban. 7. Hypertension. Blood pressure currently stable. Continue on Lopressor and diltiazem 8. Generalized  weakness/deconditioning. Will need skilled nursing facility placement on discharge.   DVT prophylaxis:Apixaban Code Status:DNR Family Communication:No family present Disposition Plan:Skilled nursing facility placement pending. Checking ammonia levels and discontinued Wellbutrin and steroids today.   Consultants:   None  Procedures:   None  Antimicrobials:   None  Subjective: Patient seen and evaluated today with no new acute complaints or concerns. No acute concerns or events noted overnight. She has some hallucinations and is still quite sedated.  Objective: Vitals:   08/19/18 0500 08/19/18 0543 08/19/18 0815 08/19/18 0816  BP:  135/76    Pulse:  60    Resp:      Temp:  97.8 F (36.6 C)    TempSrc:  Oral    SpO2:  97% 94% 98%  Weight: 105.4 kg     Height:        Intake/Output Summary (Last 24 hours) at 08/19/2018 0953 Last data filed at 08/19/2018 0900 Gross per 24 hour  Intake 720 ml  Output 1900 ml  Net -1180 ml   Filed Weights   08/17/18 0500 08/18/18 0733 08/19/18 0500  Weight: 107.1 kg 105.5 kg 105.4 kg    Examination:  General exam: Appears calm and comfortable, obese Respiratory system: Clear to auscultation. Respiratory effort normal. On RA. Cardiovascular system: S1 & S2 heard, RRR. No JVD, murmurs, rubs, gallops or clicks. No pedal edema. Gastrointestinal system: Abdomen is nondistended, soft and nontender. No organomegaly or masses felt. Normal bowel sounds heard. Central nervous system: Alert, but somnolent. Extremities: Symmetric 5 x 5 power. Skin: No rashes, lesions or ulcers Psychiatry: Cannot be assessed.    Data Reviewed: I have personally reviewed following labs and imaging studies  CBC: Recent Labs  Lab 08/16/18 1848 08/17/18 0548 08/18/18 0414  WBC 9.3 9.8 9.1  NEUTROABS 8.6*  --   --   HGB 9.9* 9.4* 9.2*  HCT 34.0* 31.9* 31.7*  MCV 90.7 91.7 90.1  PLT 260 222 001   Basic Metabolic Panel: Recent Labs  Lab  08/14/18 0514 08/16/18 1848 08/17/18 0548 08/18/18 0414 08/19/18 0515  NA 136 135 139 139 137  K 4.1 4.1 3.7 3.6 3.7  CL 101 100 103 104 100  CO2 27 26 28 27 28   GLUCOSE 259* 508* 314* 157* 309*  BUN 24* 25* 21 19 19   CREATININE 1.04* 1.03* 0.89 0.72 0.81  CALCIUM 8.5* 8.5* 8.3* 8.3* 8.1*   GFR: Estimated Creatinine Clearance: 79.8 mL/min (by C-G formula based on SCr of 0.81 mg/dL). Liver Function Tests: Recent Labs  Lab 08/16/18 1848  AST 24  ALT 17  ALKPHOS 51  BILITOT 0.7  PROT 6.6  ALBUMIN 3.1*   No results for input(s): LIPASE, AMYLASE in the last 168 hours. No results for input(s): AMMONIA in the last 168 hours. Coagulation Profile: No results for input(s): INR, PROTIME in the last 168 hours. Cardiac Enzymes: Recent Labs  Lab 08/16/18 1848  TROPONINI <0.03   BNP (last 3 results) No results for input(s): PROBNP in the last 8760 hours. HbA1C: No results for input(s): HGBA1C in the last 72 hours. CBG: Recent Labs  Lab 08/18/18 0734 08/18/18 1109 08/18/18 1637 08/18/18 2128 08/19/18 0742  GLUCAP 151* 158* 285* 296* 265*   Lipid Profile: No results for input(s): CHOL, HDL, LDLCALC, TRIG, CHOLHDL, LDLDIRECT in the last 72 hours. Thyroid Function Tests: No results for input(s): TSH, T4TOTAL, FREET4, T3FREE, THYROIDAB in the last 72 hours. Anemia Panel: No results for input(s): VITAMINB12, FOLATE, FERRITIN, TIBC, IRON, RETICCTPCT in the last 72 hours. Sepsis Labs: No results for input(s): PROCALCITON, LATICACIDVEN in the last 168 hours.  Recent Results (from the past 240 hour(s))  Urine culture     Status: Abnormal   Collection Time: 08/10/18  8:14 AM  Result Value Ref Range Status   Specimen Description   Final    URINE, CLEAN CATCH Performed at [facility], [address] [place], [place] [address]    Special Requests   Final    NONE Performed at [facility], [address], [place], [place] [address]    Culture (A)  Final    >=100,000  COLONIES/mL ESCHERICHIA COLI >=100,000 COLONIES/mL KLEBSIELLA PNEUMONIAE    Report Status 08/13/2018 FINAL  Final   Organism ID, Bacteria ESCHERICHIA COLI (A)  Final   Organism ID, Bacteria KLEBSIELLA PNEUMONIAE (A)  Final      Susceptibility   Escherichia coli - MIC*    AMPICILLIN 4 SENSITIVE Sensitive     CEFAZOLIN <=4 SENSITIVE Sensitive     CEFTRIAXONE <=1 SENSITIVE Sensitive     CIPROFLOXACIN <=0.25 SENSITIVE Sensitive     GENTAMICIN <=1 SENSITIVE Sensitive     IMIPENEM <=0.25 SENSITIVE Sensitive     NITROFURANTOIN <=16 SENSITIVE Sensitive     TRIMETH/SULFA <=20 SENSITIVE Sensitive     AMPICILLIN/SULBACTAM <=2 SENSITIVE Sensitive     PIP/TAZO <=4 SENSITIVE Sensitive     Extended ESBL NEGATIVE Sensitive     * >=100,000 COLONIES/mL ESCHERICHIA COLI   Klebsiella pneumoniae - MIC*    AMPICILLIN >=32 RESISTANT Resistant     CEFAZOLIN <=4 SENSITIVE Sensitive     CEFTRIAXONE <=1 SENSITIVE Sensitive     CIPROFLOXACIN <=0.25 SENSITIVE Sensitive     GENTAMICIN <=1 SENSITIVE Sensitive     IMIPENEM <=0.25 SENSITIVE Sensitive     NITROFURANTOIN 64 INTERMEDIATE Intermediate     TRIMETH/SULFA <=20 SENSITIVE Sensitive  AMPICILLIN/SULBACTAM 8 SENSITIVE Sensitive     PIP/TAZO <=4 SENSITIVE Sensitive     Extended ESBL NEGATIVE Sensitive     * >=100,000 COLONIES/mL KLEBSIELLA PNEUMONIAE  MRSA PCR Screening     Status: Abnormal   Collection Time: 08/10/18  2:02 PM  Result Value Ref Range Status   MRSA by PCR POSITIVE (A) NEGATIVE Final    Comment:        The GeneXpert MRSA Assay (FDA approved for NASAL specimens only), is one component of a comprehensive MRSA colonization surveillance program. It is not intended to diagnose MRSA infection nor to guide or monitor treatment for MRSA infections. RESULT CALLED TO, READ BACK BY AND VERIFIED WITH: CUMMINGS,R @ 0017 ON 08/11/18 BY JUW Performed at [facility], [address], [place], [place] [address]          Radiology  Studies: No results found.      Scheduled Meds: . apixaban  5 mg Oral BID  . diltiazem  240 mg Oral Daily  . furosemide  40 mg Oral Daily  . guaiFENesin  600 mg Oral BID  . insulin aspart  0-20 Units Subcutaneous TID WC  . insulin aspart  0-5 Units Subcutaneous QHS  . insulin detemir  5 Units Subcutaneous BID  . ipratropium-albuterol  3 mL Nebulization Q6H  . metoprolol tartrate  50 mg Oral BID  . sodium chloride flush  3 mL Intravenous Q12H  . umeclidinium bromide  2 puff Inhalation Daily   Continuous Infusions:   LOS: 0 days    Time spent: 30 minutes    Royetta Probus Darleen Crocker, DO [facility] Pager [phone]  If 7PM-7AM, please contact night-coverage [email or web address] Password [facility] 08/19/2018, 9:53 AM

## 2018-08-19 NOTE — Progress Notes (Signed)
Physical Therapy Treatment Patient Details Name: [name] MRN: [id number] DOB: [birth year] Today's Date: 08/19/2018    History of Present Illness [name] is a 68 y.o. female with medical history significant for chronic diastolic CHF EF 41-74%, atrial fibrillation and LV thrombus on Eliquis, type 2 diabetes, COPD, hypertension, and depression who presents to the ED with 2 days of shortness of breath with wheezing, dry mouth and throat, and generalized weakness.    PT Comments    Patient demonstrates slightly increased BLE strength for completing sit to stands, but required 2 attempts before standing, good return for completing BLE ROM/strengthening exercises and able to take a few steps at bedside during transfer to chair, unable to walk away from bedside due to poor standing balance and fatigue.  Patient tolerated sitting up in chair after therapy - RN notified.  Patient will benefit from continued physical therapy in hospital and recommended venue below to increase strength, balance, endurance for safe ADLs and gait.    Follow Up Recommendations  SNF     Equipment Recommendations       Recommendations for Other Services       Precautions / Restrictions Precautions Precautions: Fall Restrictions Weight Bearing Restrictions: No    Mobility  Bed Mobility Overal bed mobility: Needs Assistance Bed Mobility: Supine to Sit     Supine to sit: Min guard;Min assist     General bed mobility comments: had to use bed rail, increased time  Transfers Overall transfer level: Needs assistance Equipment used: Rolling walker (2 wheeled) Transfers: Sit to/from [facility] Sit to Stand: Min assist Stand pivot transfers: Min assist       General transfer comment: increased BLE for sit to stand transfers, required verbal cues for proper hand placement/body mechanics  Ambulation/Gait Ambulation/Gait assistance: Mod assist Gait Distance (Feet): 4  Feet Assistive device: Rolling walker (2 wheeled) Gait Pattern/deviations: Decreased step length - right;Decreased step length - left;Decreased stride length Gait velocity: slow   General Gait Details: limited to 7-8 slow unsteady labored steps due to c/o fatigue and generalized weakness   Stairs             Wheelchair Mobility    Modified Rankin (Stroke Patients Only)       Balance Overall balance assessment: Needs assistance Sitting-balance support: Feet supported;No upper extremity supported Sitting balance-Leahy Scale: Good     Standing balance support: Bilateral upper extremity supported;During functional activity Standing balance-Leahy Scale: Fair Standing balance comment: using RW                            Cognition Arousal/Alertness: Awake/alert Behavior During Therapy: WFL for tasks assessed/performed Overall Cognitive Status: Within Functional Limits for tasks assessed                                        Exercises General Exercises - Lower Extremity Long Arc Quad: Seated;AROM;Strengthening;Both;10 reps Hip Flexion/Marching: Seated;Strengthening;AROM;Both;10 reps Toe Raises: Seated;Strengthening;AROM;Both;10 reps Heel Raises: Seated;AROM;Strengthening;Both;10 reps    General Comments        Pertinent Vitals/Pain Pain Assessment: No/denies pain    Home Living                      Prior Function            PT Goals (current goals can now  be found in the care plan section) Acute Rehab PT Goals Patient Stated Goal: return home after rehab PT Goal Formulation: With patient Time For Goal Achievement: 08/27/18 Potential to Achieve Goals: Good Progress towards PT goals: Progressing toward goals    Frequency    Min 3X/week      PT Plan Current plan remains appropriate    Co-evaluation              AM-PAC PT "6 Clicks" Mobility   Outcome Measure  Help needed turning from your back to your  side while in a flat bed without using bedrails?: A Little(required use of bedrails) Help needed moving from lying on your back to sitting on the side of a flat bed without using bedrails?: A Lot(required use of bed rails) Help needed moving to and from a bed to a chair (including a wheelchair)?: Total Help needed standing up from a chair using your arms (e.g., wheelchair or bedside chair)?: A Lot Help needed to walk in hospital room?: A Lot   6 Click Score: 10    End of Session Equipment Utilized During Treatment: Gait belt Activity Tolerance: Patient tolerated treatment well;Patient limited by fatigue Patient left: in chair;with call bell/phone within reach Nurse Communication: Mobility status PT Visit Diagnosis: Unsteadiness on feet (R26.81);Other abnormalities of gait and mobility (R26.89);Muscle weakness (generalized) (M62.81)     Time: 9914-4458 PT Time Calculation (min) (ACUTE ONLY): 23 min  Charges:  $Therapeutic Exercise: 8-22 mins $Therapeutic Activity: 8-22 mins                     4:27 PM, 08/19/18 Lonell Grandchild, MPT Physical Therapist with [facility] 336 [phone] office [phone] mobile phone

## 2018-08-20 LAB — GLUCOSE, CAPILLARY
Glucose-Capillary: 208 mg/dL — ABNORMAL HIGH (ref 70–99)
Glucose-Capillary: 239 mg/dL — ABNORMAL HIGH (ref 70–99)
Glucose-Capillary: 113 mg/dL — ABNORMAL HIGH (ref 70–99)
Glucose-Capillary: 276 mg/dL — ABNORMAL HIGH (ref 70–99)

## 2018-08-20 MED ORDER — INSULIN DETEMIR 100 UNIT/ML ~~LOC~~ SOLN
8.0000 [IU] | Freq: Two times a day (BID) | SUBCUTANEOUS | Status: DC
  Administered 2018-08-20 – 2018-08-21 (×2): 8 [IU] via SUBCUTANEOUS
  Filled 2018-08-20 (×6): qty 0.08

## 2018-08-20 MED ORDER — INSULIN ASPART 100 UNIT/ML ~~LOC~~ SOLN
4.0000 [IU] | Freq: Three times a day (TID) | SUBCUTANEOUS | Status: DC
  Administered 2018-08-21 – 2018-08-24 (×9): 4 [IU] via SUBCUTANEOUS

## 2018-08-20 MED ORDER — BISACODYL 10 MG RE SUPP
10.0000 mg | Freq: Once | RECTAL | Status: AC
  Administered 2018-08-20: 10 mg via RECTAL
  Filled 2018-08-20: qty 1

## 2018-08-20 NOTE — Clinical Social Work Note (Signed)
[facility] has started patient's authorization.    Doralyn Kirkes, Clydene Pugh, LCSW

## 2018-08-20 NOTE — Progress Notes (Signed)
PROGRESS NOTE    [name]  WPY:[id number] DOB: 1950-10-05 DOA: 08/16/2018 PCP: [name], MD   Brief Narrative:  68 year old female with a history of diastolic CHF, atrial fibrillation on anticoagulation, COPD, hypertension, diabetes, was recently in the hospital after being treated for CHF/COPD exacerbation. She returned to the hospital with hyperglycemia secondary to steroids and persistent wheezing. She was admitted to the hospital started on sliding scale insulin. She is continued on prednisone taper for COPD. Volume status appears to be compensated. Seen by physical therapy with recommendations for skilled nursing facility placement. She continues to have some hallucinations this morning and sedation.  Assessment & Plan:   Principal Problem:   Dehydration Active Problems:   Diabetes type 2, uncontrolled (HCC)   HTN (hypertension)   Depression   Unspecified atrial fibrillation (HCC)   Chronic diastolic CHF (congestive heart failure) (HCC)   COPD (chronic obstructive pulmonary disease) ([place])  1. Uncontrolled diabetes with hyperglycemia-improving. Related to recent steroids. Improved with initiation of sliding scale insulin and gentle hydration. Added Levemir on 1/2 with increased to 8 units twice daily today as well as NovoLog 3 times daily added.  Patient is no longer on prednisone. 2. Acute encephalopathy with hallucinations and lethargy-improving. Stopped steroids on 1/2. Hold Wellbutrin for now.  Will within normal limits.. Continue to hold trazodone. 3. COPD. Still has some diffuse wheezing, but appears to be breathing comfortably on room air. Continue bronchodilators and stop prednisone. Mucinex added today. 4. Chronic atrial fibrillation. Continue rate control medications. Anticoagulated with apixaban. 5. Chronic diastolic CHF. Appears to be compensated at this time. Resume oral Lasix. 6. History of LV thrombus. Continue anticoagulation with  apixaban. 7. Hypertension. Blood pressure currently stable. Continue on Lopressor and diltiazem 8. Generalized weakness/deconditioning. Will need skilled nursing facility placement on discharge.  PT has reevaluated on 1/3.  DVT prophylaxis:Apixaban Code Status:DNR Family Communication:No family present Disposition Plan:Skilled nursing facility placementpending.  Increase in Levemir and NovoLog 3 times daily added for blood glucose control.   Consultants:  None  Procedures:  None  Antimicrobials:   None  Subjective: Patient seen and evaluated today with no new acute complaints or concerns. No acute concerns or events noted overnight.  She appears to may be more alert and awake and denies any hallucinations this morning.  She states that she has not had a bowel movement in the last several days.  Objective: Vitals:   08/20/18 0456 08/20/18 0600 08/20/18 0737 08/20/18 0906  BP: (!) 134/54   (!) 134/57  Pulse: 73   80  Resp:      Temp: 98.1 F (36.7 C)     TempSrc: Oral     SpO2: 95%  96%   Weight:  104.2 kg    Height:        Intake/Output Summary (Last 24 hours) at 08/20/2018 1234 Last data filed at 08/20/2018 0900 Gross per 24 hour  Intake 840 ml  Output 1300 ml  Net -460 ml   Filed Weights   08/18/18 0733 08/19/18 0500 08/20/18 0600  Weight: 105.5 kg 105.4 kg 104.2 kg    Examination:  General exam: Appears calm and comfortable, obese Respiratory system: Clear to auscultation. Respiratory effort normal. Cardiovascular system: S1 & S2 heard, RRR. No JVD, murmurs, rubs, gallops or clicks. No pedal edema. Gastrointestinal system: Abdomen is nondistended, soft and nontender. No organomegaly or masses felt. Normal bowel sounds heard. Central nervous system: Alert and oriented. No focal neurological deficits. Extremities: Symmetric 5 x 5  power. Skin: No rashes, lesions or ulcers Psychiatry: Judgement and insight appear normal. Mood & affect appropriate.       Data Reviewed: I have personally reviewed following labs and imaging studies  CBC: Recent Labs  Lab 08/16/18 1848 08/17/18 0548 08/18/18 0414  WBC 9.3 9.8 9.1  NEUTROABS 8.6*  --   --   HGB 9.9* 9.4* 9.2*  HCT 34.0* 31.9* 31.7*  MCV 90.7 91.7 90.1  PLT 260 222 347   Basic Metabolic Panel: Recent Labs  Lab 08/14/18 0514 08/16/18 1848 08/17/18 0548 08/18/18 0414 08/19/18 0515  NA 136 135 139 139 137  K 4.1 4.1 3.7 3.6 3.7  CL 101 100 103 104 100  CO2 27 26 28 27 28   GLUCOSE 259* 508* 314* 157* 309*  BUN 24* 25* 21 19 19   CREATININE 1.04* 1.03* 0.89 0.72 0.81  CALCIUM 8.5* 8.5* 8.3* 8.3* 8.1*   GFR: Estimated Creatinine Clearance: 79.3 mL/min (by C-G formula based on SCr of 0.81 mg/dL). Liver Function Tests: Recent Labs  Lab 08/16/18 1848  AST 24  ALT 17  ALKPHOS 51  BILITOT 0.7  PROT 6.6  ALBUMIN 3.1*   No results for input(s): LIPASE, AMYLASE in the last 168 hours. Recent Labs  Lab 08/19/18 1026  AMMONIA <9*   Coagulation Profile: No results for input(s): INR, PROTIME in the last 168 hours. Cardiac Enzymes: Recent Labs  Lab 08/16/18 1848  TROPONINI <0.03   BNP (last 3 results) No results for input(s): PROBNP in the last 8760 hours. HbA1C: No results for input(s): HGBA1C in the last 72 hours. CBG: Recent Labs  Lab 08/19/18 1128 08/19/18 1643 08/19/18 2159 08/20/18 0735 08/20/18 1116  GLUCAP 232* 123* 170* 208* 239*   Lipid Profile: No results for input(s): CHOL, HDL, LDLCALC, TRIG, CHOLHDL, LDLDIRECT in the last 72 hours. Thyroid Function Tests: No results for input(s): TSH, T4TOTAL, FREET4, T3FREE, THYROIDAB in the last 72 hours. Anemia Panel: No results for input(s): VITAMINB12, FOLATE, FERRITIN, TIBC, IRON, RETICCTPCT in the last 72 hours. Sepsis Labs: No results for input(s): PROCALCITON, LATICACIDVEN in the last 168 hours.  Recent Results (from the past 240 hour(s))  MRSA PCR Screening     Status: Abnormal   Collection  Time: 08/10/18  2:02 PM  Result Value Ref Range Status   MRSA by PCR POSITIVE (A) NEGATIVE Final    Comment:        The GeneXpert MRSA Assay (FDA approved for NASAL specimens only), is one component of a comprehensive MRSA colonization surveillance program. It is not intended to diagnose MRSA infection nor to guide or monitor treatment for MRSA infections. RESULT CALLED TO, READ BACK BY AND VERIFIED WITH: CUMMINGS,R @ 0017 ON 08/11/18 BY JUW Performed at [facility], [address], [place], [place] [address]          Radiology Studies: No results found.      Scheduled Meds: . apixaban  5 mg Oral BID  . diltiazem  240 mg Oral Daily  . furosemide  40 mg Oral Daily  . guaiFENesin  600 mg Oral BID  . insulin aspart  0-20 Units Subcutaneous TID WC  . insulin aspart  0-5 Units Subcutaneous QHS  . insulin aspart  4 Units Subcutaneous TID WC  . insulin detemir  8 Units Subcutaneous BID  . ipratropium-albuterol  3 mL Nebulization Q6H  . metoprolol tartrate  50 mg Oral BID  . sodium chloride flush  3 mL Intravenous Q12H  . umeclidinium bromide  2 puff Inhalation Daily   Continuous Infusions:   LOS: 1 day    Time spent: 30 minutes    Jerryl Holzhauer Darleen Crocker, DO [facility] Pager [phone]  If 7PM-7AM, please contact night-coverage [email or web address] Password [facility] 08/20/2018, 12:34 PM

## 2018-08-21 LAB — GLUCOSE, CAPILLARY
Glucose-Capillary: 236 mg/dL — ABNORMAL HIGH (ref 70–99)
Glucose-Capillary: 205 mg/dL — ABNORMAL HIGH (ref 70–99)
Glucose-Capillary: 75 mg/dL (ref 70–99)
Glucose-Capillary: 133 mg/dL — ABNORMAL HIGH (ref 70–99)

## 2018-08-21 MED ORDER — INSULIN DETEMIR 100 UNIT/ML ~~LOC~~ SOLN
10.0000 [IU] | Freq: Two times a day (BID) | SUBCUTANEOUS | Status: DC
  Administered 2018-08-21 – 2018-08-24 (×6): 10 [IU] via SUBCUTANEOUS
  Filled 2018-08-21 (×8): qty 0.1

## 2018-08-21 MED ORDER — TRAZODONE HCL 50 MG PO TABS
25.0000 mg | ORAL_TABLET | Freq: Every day | ORAL | Status: DC
  Administered 2018-08-21 – 2018-08-23 (×3): 25 mg via ORAL
  Filled 2018-08-21 (×3): qty 1

## 2018-08-21 NOTE — Progress Notes (Signed)
PROGRESS NOTE    [name]  KAJ:[id number] DOB: August 18, 1951 DOA: 08/16/2018 PCP: [name], MD   Brief Narrative:  68 year old female with a history of diastolic CHF, atrial fibrillation on anticoagulation, COPD, hypertension, diabetes, was recently in the hospital after being treated for CHF/COPD exacerbation. She returned to the hospital with hyperglycemia secondary to steroids and persistent wheezing. She was admitted to the hospital started on sliding scale insulin. She is continued on prednisone taper for COPD. Volume status appears to be compensated. Seen by physical therapy with recommendations for skilled nursing facility placement. She continues to have some sedation this am and did not sleep well overnight.  Assessment & Plan:   Principal Problem:   Dehydration Active Problems:   Diabetes type 2, uncontrolled (HCC)   HTN (hypertension)   Depression   Unspecified atrial fibrillation (HCC)   Chronic diastolic CHF (congestive heart failure) (HCC)   COPD (chronic obstructive pulmonary disease) ([place])  1. Uncontrolled diabetes with hyperglycemia-improving. Related to recent steroids. Improved with initiation of sliding scale insulin and gentle hydration. Added Levemir with increased to 10 units twice daily today as well as NovoLog 3 times daily added.  Patient is no longer on prednisone. 2. Acute encephalopathy with hallucinations and lethargy-improving. Stopped steroids on 1/2. Hold Wellbutrin for now.  Will within normal limits. Will restart trazodone low dose tonight to help with sleep. 3. COPD. Appears to be breathing comfortably on room air. Continue bronchodilators and stop prednisone. Mucinex added today. 4. Chronic atrial fibrillation. Continue rate control medications. Anticoagulated with apixaban. 5. Chronic diastolic CHF. Appears to be compensated at this time. Resume oral Lasix. 6. History of LV thrombus. Continue anticoagulation with  apixaban. 7. Hypertension. Blood pressure currently stable. Continue on Lopressor and diltiazem 8. Generalized weakness/deconditioning. Will need skilled nursing facility placement on discharge.  PT has reevaluated on 1/3.  DVT prophylaxis:Apixaban Code Status:DNR Family Communication:No family present Disposition Plan:Skilled nursing facility placementpending.  Increase in Levemir and NovoLog 3 times daily added for blood glucose control.   Consultants:  None  Procedures:  None  Antimicrobials:   None  Subjective: Patient seen and evaluated today with no new acute complaints or concerns. No acute concerns or events noted overnight.  She is feeling pretty fatigued this morning and states that she did not fall asleep until approximately 2 AM.  Objective: Vitals:   08/21/18 0212 08/21/18 0612 08/21/18 0847 08/21/18 0911  BP:  129/68  137/62  Pulse:  (!) 58    Resp:  18    Temp:  97.9 F (36.6 C)    TempSrc:      SpO2: 94%  97%   Weight:  104.9 kg    Height:        Intake/Output Summary (Last 24 hours) at 08/21/2018 1200 Last data filed at 08/21/2018 0900 Gross per 24 hour  Intake 1080 ml  Output 1400 ml  Net -320 ml   Filed Weights   08/19/18 0500 08/20/18 0600 08/21/18 0612  Weight: 105.4 kg 104.2 kg 104.9 kg    Examination:  General exam: Appears calm and comfortable  Respiratory system: Clear to auscultation. Respiratory effort normal. Cardiovascular system: S1 & S2 heard, RRR. No JVD, murmurs, rubs, gallops or clicks. No pedal edema. Gastrointestinal system: Abdomen is nondistended, soft and nontender. No organomegaly or masses felt. Normal bowel sounds heard. Central nervous system: Alert and oriented. No focal neurological deficits. Extremities: Symmetric 5 x 5 power. Skin: No rashes, lesions or ulcers Psychiatry: Judgement and insight  appear normal. Mood & affect appropriate.     Data Reviewed: I have personally reviewed following labs  and imaging studies  CBC: Recent Labs  Lab 08/16/18 1848 08/17/18 0548 08/18/18 0414  WBC 9.3 9.8 9.1  NEUTROABS 8.6*  --   --   HGB 9.9* 9.4* 9.2*  HCT 34.0* 31.9* 31.7*  MCV 90.7 91.7 90.1  PLT 260 222 641   Basic Metabolic Panel: Recent Labs  Lab 08/16/18 1848 08/17/18 0548 08/18/18 0414 08/19/18 0515  NA 135 139 139 137  K 4.1 3.7 3.6 3.7  CL 100 103 104 100  CO2 26 28 27 28   GLUCOSE 508* 314* 157* 309*  BUN 25* 21 19 19   CREATININE 1.03* 0.89 0.72 0.81  CALCIUM 8.5* 8.3* 8.3* 8.1*   GFR: Estimated Creatinine Clearance: 79.6 mL/min (by C-G formula based on SCr of 0.81 mg/dL). Liver Function Tests: Recent Labs  Lab 08/16/18 1848  AST 24  ALT 17  ALKPHOS 51  BILITOT 0.7  PROT 6.6  ALBUMIN 3.1*   No results for input(s): LIPASE, AMYLASE in the last 168 hours. Recent Labs  Lab 08/19/18 1026  AMMONIA <9*   Coagulation Profile: No results for input(s): INR, PROTIME in the last 168 hours. Cardiac Enzymes: Recent Labs  Lab 08/16/18 1848  TROPONINI <0.03   BNP (last 3 results) No results for input(s): PROBNP in the last 8760 hours. HbA1C: No results for input(s): HGBA1C in the last 72 hours. CBG: Recent Labs  Lab 08/20/18 1116 08/20/18 1657 08/20/18 2206 08/21/18 0752 08/21/18 1144  GLUCAP 239* 113* 276* 236* 205*   Lipid Profile: No results for input(s): CHOL, HDL, LDLCALC, TRIG, CHOLHDL, LDLDIRECT in the last 72 hours. Thyroid Function Tests: No results for input(s): TSH, T4TOTAL, FREET4, T3FREE, THYROIDAB in the last 72 hours. Anemia Panel: No results for input(s): VITAMINB12, FOLATE, FERRITIN, TIBC, IRON, RETICCTPCT in the last 72 hours. Sepsis Labs: No results for input(s): PROCALCITON, LATICACIDVEN in the last 168 hours.  No results found for this or any previous visit (from the past 240 hour(s)).       Radiology Studies: No results found.      Scheduled Meds: . apixaban  5 mg Oral BID  . diltiazem  240 mg Oral Daily  .  furosemide  40 mg Oral Daily  . guaiFENesin  600 mg Oral BID  . insulin aspart  0-20 Units Subcutaneous TID WC  . insulin aspart  0-5 Units Subcutaneous QHS  . insulin aspart  4 Units Subcutaneous TID WC  . insulin detemir  8 Units Subcutaneous BID  . ipratropium-albuterol  3 mL Nebulization Q6H  . metoprolol tartrate  50 mg Oral BID  . sodium chloride flush  3 mL Intravenous Q12H  . traZODone  25 mg Oral QHS  . umeclidinium bromide  2 puff Inhalation Daily   Continuous Infusions:   LOS: 2 days    Time spent: 30 minutes    Pratik Darleen Crocker, DO [facility] Pager [phone]  If 7PM-7AM, please contact night-coverage [email or web address] Password [email or web address] 08/21/2018, 12:00 PM

## 2018-08-22 LAB — GLUCOSE, CAPILLARY
GLUCOSE-CAPILLARY: 134 mg/dL — AB (ref 70–99)
GLUCOSE-CAPILLARY: 151 mg/dL — AB (ref 70–99)
Glucose-Capillary: 259 mg/dL — ABNORMAL HIGH (ref 70–99)
Glucose-Capillary: 121 mg/dL — ABNORMAL HIGH (ref 70–99)

## 2018-08-22 MED ORDER — PHENOL 1.4 % MT LIQD
1.0000 | OROMUCOSAL | Status: DC | PRN
  Administered 2018-08-22: 1 via OROMUCOSAL
  Filled 2018-08-22: qty 177

## 2018-08-22 MED ORDER — DM-GUAIFENESIN ER 30-600 MG PO TB12
1.0000 | ORAL_TABLET | Freq: Two times a day (BID) | ORAL | Status: DC
  Administered 2018-08-22 – 2018-08-24 (×5): 1 via ORAL
  Filled 2018-08-22 (×5): qty 1

## 2018-08-22 MED ORDER — BUPROPION HCL ER (XL) 300 MG PO TB24
300.0000 mg | ORAL_TABLET | Freq: Every day | ORAL | Status: DC
  Administered 2018-08-22 – 2018-08-24 (×3): 300 mg via ORAL
  Filled 2018-08-22 (×3): qty 1

## 2018-08-22 MED ORDER — IPRATROPIUM-ALBUTEROL 0.5-2.5 (3) MG/3ML IN SOLN
3.0000 mL | Freq: Four times a day (QID) | RESPIRATORY_TRACT | Status: DC | PRN
  Administered 2018-08-23: 3 mL via RESPIRATORY_TRACT
  Filled 2018-08-22: qty 3

## 2018-08-22 MED ORDER — ESCITALOPRAM OXALATE 10 MG PO TABS
5.0000 mg | ORAL_TABLET | Freq: Every day | ORAL | Status: DC
  Administered 2018-08-22 – 2018-08-24 (×3): 5 mg via ORAL
  Filled 2018-08-22 (×3): qty 1

## 2018-08-22 NOTE — Progress Notes (Signed)
PROGRESS NOTE    [name]  YKD:[id number] DOB: April 04, 1951 DOA: 08/16/2018 PCP: [name], MD   Brief Narrative:  68 year old female with a history of diastolic CHF, atrial fibrillation on anticoagulation, COPD, hypertension, diabetes, was recently in the hospital after being treated for CHF/COPD exacerbation. She returned to the hospital with hyperglycemia secondary to steroids and persistent wheezing. She was admitted to the hospital started on sliding scale insulin. She is continued on prednisone taper for COPD. Volume status appears to be compensated. Seen by physical therapy with recommendations for skilled nursing facility placement. She no longer has any sedation, but does have a mild cough this morning.   Assessment & Plan:   Principal Problem:   Dehydration Active Problems:   Diabetes type 2, uncontrolled (HCC)   HTN (hypertension)   Depression   Unspecified atrial fibrillation (HCC)   Chronic diastolic CHF (congestive heart failure) (HCC)   COPD (chronic obstructive pulmonary disease) ([place])   1. Uncontrolled diabetes with hyperglycemia-improving. Related to recent steroids. Improved with initiation of sliding scale insulin and gentle hydration.  Continue Levemir with increased to 10 units twice daily as well as NovoLog 3 times daily added. Patient is no longer on prednisone. 2. Acute encephalopathy with hallucinations and lethargy- resolved. Stopped steroidson 1/2.  Low-dose trazodone used at night with good improvement in sleep noted.  She is more alert and awake this morning.  Will resume home Wellbutrin dose as well. 3. Cough.  Will use Mucinex for cough as well as Chloraseptic spray to help with sore throat.  Will reevaluate further with recheck on CBC in a.m. and see if she needs treatment for any acute bronchitis. 4. COPD. Appears to be breathing comfortably on room air. Continue bronchodilators and stop prednisone.  Cough medication added  today. 5. Chronic atrial fibrillation. Continue rate control medications. Anticoagulated with apixaban. 6. Chronic diastolic CHF. Appears to be compensated at this time. Resume oral Lasix. 7. History of LV thrombus. Continue anticoagulation with apixaban. 8. Hypertension. Blood pressure currently stable. Continue on Lopressor and diltiazem 9. Generalized weakness/deconditioning. Will need skilled nursing facility placement on discharge.PT has reevaluated on 1/3.  DVT prophylaxis:Apixaban Code Status:DNR Family Communication:No family present Disposition Plan:Skilled nursing facility placementpending.   Consultants:  None  Procedures:  None  Antimicrobials:   None   Subjective: Patient seen and evaluated today with no new acute complaints or concerns. No acute concerns or events noted overnight.  She did sleep well overnight, but does have some sore throat and cough this morning.  She is more hungry and is requesting more snacks with her meals.  Objective: Vitals:   08/21/18 2141 08/22/18 0152 08/22/18 0500 08/22/18 0542  BP: 111/72   (!) 121/56  Pulse: 69   62  Resp: 18   18  Temp: 98.2 F (36.8 C)   97.8 F (36.6 C)  TempSrc: Oral   Oral  SpO2: 96% 95%  100%  Weight:   103.4 kg   Height:        Intake/Output Summary (Last 24 hours) at 08/22/2018 1029 Last data filed at 08/22/2018 0900 Gross per 24 hour  Intake 960 ml  Output 1000 ml  Net -40 ml   Filed Weights   08/20/18 0600 08/21/18 0612 08/22/18 0500  Weight: 104.2 kg 104.9 kg 103.4 kg    Examination:  General exam: Appears calm and comfortable, obese Respiratory system: Clear to auscultation. Respiratory effort normal.  On room air Cardiovascular system: S1 & S2 heard, RRR. No  JVD, murmurs, rubs, gallops or clicks. No pedal edema. Gastrointestinal system: Abdomen is nondistended, soft and nontender. No organomegaly or masses felt. Normal bowel sounds heard. Central nervous system:  Alert and oriented. No focal neurological deficits. Extremities: Symmetric 5 x 5 power. Skin: No rashes, lesions or ulcers Psychiatry: Judgement and insight appear normal. Mood & affect appropriate.     Data Reviewed: I have personally reviewed following labs and imaging studies  CBC: Recent Labs  Lab 08/16/18 1848 08/17/18 0548 08/18/18 0414  WBC 9.3 9.8 9.1  NEUTROABS 8.6*  --   --   HGB 9.9* 9.4* 9.2*  HCT 34.0* 31.9* 31.7*  MCV 90.7 91.7 90.1  PLT 260 222 235   Basic Metabolic Panel: Recent Labs  Lab 08/16/18 1848 08/17/18 0548 08/18/18 0414 08/19/18 0515  NA 135 139 139 137  K 4.1 3.7 3.6 3.7  CL 100 103 104 100  CO2 26 28 27 28   GLUCOSE 508* 314* 157* 309*  BUN 25* 21 19 19   CREATININE 1.03* 0.89 0.72 0.81  CALCIUM 8.5* 8.3* 8.3* 8.1*   GFR: Estimated Creatinine Clearance: 78.9 mL/min (by C-G formula based on SCr of 0.81 mg/dL). Liver Function Tests: Recent Labs  Lab 08/16/18 1848  AST 24  ALT 17  ALKPHOS 51  BILITOT 0.7  PROT 6.6  ALBUMIN 3.1*   No results for input(s): LIPASE, AMYLASE in the last 168 hours. Recent Labs  Lab 08/19/18 1026  AMMONIA <9*   Coagulation Profile: No results for input(s): INR, PROTIME in the last 168 hours. Cardiac Enzymes: Recent Labs  Lab 08/16/18 1848  TROPONINI <0.03   BNP (last 3 results) No results for input(s): PROBNP in the last 8760 hours. HbA1C: No results for input(s): HGBA1C in the last 72 hours. CBG: Recent Labs  Lab 08/21/18 0752 08/21/18 1144 08/21/18 1638 08/21/18 2141 08/22/18 0720  GLUCAP 236* 205* 75 133* 134*   Lipid Profile: No results for input(s): CHOL, HDL, LDLCALC, TRIG, CHOLHDL, LDLDIRECT in the last 72 hours. Thyroid Function Tests: No results for input(s): TSH, T4TOTAL, FREET4, T3FREE, THYROIDAB in the last 72 hours. Anemia Panel: No results for input(s): VITAMINB12, FOLATE, FERRITIN, TIBC, IRON, RETICCTPCT in the last 72 hours. Sepsis Labs: No results for input(s):  PROCALCITON, LATICACIDVEN in the last 168 hours.  No results found for this or any previous visit (from the past 240 hour(s)).       Radiology Studies: No results found.      Scheduled Meds: . apixaban  5 mg Oral BID  . buPROPion  300 mg Oral Daily  . dextromethorphan-guaiFENesin  1 tablet Oral BID  . diltiazem  240 mg Oral Daily  . furosemide  40 mg Oral Daily  . insulin aspart  0-20 Units Subcutaneous TID WC  . insulin aspart  0-5 Units Subcutaneous QHS  . insulin aspart  4 Units Subcutaneous TID WC  . insulin detemir  10 Units Subcutaneous BID  . ipratropium-albuterol  3 mL Nebulization Q6H  . metoprolol tartrate  50 mg Oral BID  . sodium chloride flush  3 mL Intravenous Q12H  . traZODone  25 mg Oral QHS  . umeclidinium bromide  2 puff Inhalation Daily   Continuous Infusions:   LOS: 3 days    Time spent: 30 minutes    Jeanette Rauth Darleen Crocker, DO [facility] Pager [phone]  If 7PM-7AM, please contact night-coverage [email or web address] Password [facility] 08/22/2018, 10:29 AM

## 2018-08-22 NOTE — Progress Notes (Signed)
CBG not crossing over from glucometer, result 121.

## 2018-08-23 ENCOUNTER — Inpatient Hospital Stay (HOSPITAL_COMMUNITY): Payer: BLUE CROSS/BLUE SHIELD

## 2018-08-23 LAB — CBC
HCT: 34.4 % — ABNORMAL LOW (ref 36.0–46.0)
Hemoglobin: 10.2 g/dL — ABNORMAL LOW (ref 12.0–15.0)
MCH: 26.9 pg (ref 26.0–34.0)
MCHC: 29.7 g/dL — ABNORMAL LOW (ref 30.0–36.0)
MCV: 90.8 fL (ref 80.0–100.0)
NRBC: 0 % (ref 0.0–0.2)
Platelets: 312 10*3/uL (ref 150–400)
RBC: 3.79 MIL/uL — ABNORMAL LOW (ref 3.87–5.11)
RDW: 17.1 % — ABNORMAL HIGH (ref 11.5–15.5)
WBC: 10.2 10*3/uL (ref 4.0–10.5)

## 2018-08-23 LAB — BASIC METABOLIC PANEL
Anion gap: 7 (ref 5–15)
BUN: 17 mg/dL (ref 8–23)
CO2: 34 mmol/L — ABNORMAL HIGH (ref 22–32)
Calcium: 7.9 mg/dL — ABNORMAL LOW (ref 8.9–10.3)
Chloride: 98 mmol/L (ref 98–111)
Creatinine, Ser: 0.84 mg/dL (ref 0.44–1.00)
GFR calc Af Amer: >60 mL/min (ref >60)
GFR calc non Af Amer: >60 mL/min (ref >60)
Glucose, Bld: 96 mg/dL (ref 70–99)
Potassium: 3.4 mmol/L — ABNORMAL LOW (ref 3.5–5.1)
SODIUM: 139 mmol/L (ref 135–145)

## 2018-08-23 LAB — GLUCOSE, CAPILLARY
Glucose-Capillary: 119 mg/dL — ABNORMAL HIGH (ref 70–99)
Glucose-Capillary: 171 mg/dL — ABNORMAL HIGH (ref 70–99)
Glucose-Capillary: 60 mg/dL — ABNORMAL LOW (ref 70–99)
Glucose-Capillary: 66 mg/dL — ABNORMAL LOW (ref 70–99)
Glucose-Capillary: 84 mg/dL (ref 70–99)
Glucose-Capillary: 284 mg/dL — ABNORMAL HIGH (ref 70–99)

## 2018-08-23 MED ORDER — NYSTATIN 100000 UNIT/GM EX POWD
Freq: Three times a day (TID) | CUTANEOUS | Status: DC | PRN
  Administered 2018-08-23: 22:00:00 via TOPICAL
  Filled 2018-08-23: qty 15

## 2018-08-23 MED ORDER — FLUCONAZOLE 150 MG PO TABS
150.0000 mg | ORAL_TABLET | Freq: Once | ORAL | Status: AC
  Administered 2018-08-23: 150 mg via ORAL
  Filled 2018-08-23: qty 1

## 2018-08-23 NOTE — Progress Notes (Signed)
PROGRESS NOTE    [name]  YJE:[id number] DOB: Sep 19, 1950 DOA: 08/16/2018 PCP: [name], MD   Brief Narrative:  68 year old female with a history of diastolic CHF, atrial fibrillation on anticoagulation, COPD, hypertension, diabetes, was recently in the hospital after being treated for CHF/COPD exacerbation. She returned to the hospital with hyperglycemia secondary to steroids and persistent wheezing. She was admitted to the hospital started on sliding scale insulin. She is continued on prednisone taper for COPD. Volume status appears to be compensated. Seen by physical therapy with recommendations for skilled nursing facility placement. She no longer has any sedation, but does have a mild cough this morning.  Assessment & Plan:   Principal Problem:   Dehydration Active Problems:   Diabetes type 2, uncontrolled (HCC)   HTN (hypertension)   Depression   Unspecified atrial fibrillation (HCC)   Chronic diastolic CHF (congestive heart failure) (HCC)   COPD (chronic obstructive pulmonary disease) ([place])  1. Uncontrolled diabetes with hyperglycemia-improving. Related to recent steroids. Improved with initiation of sliding scale insulin and gentle hydration.  Continue Levemir with increased to 10units twice daily as well as NovoLog 3 times daily added. Patient is no longer on prednisone. 2. Dysphasia.  Appears to be esophageal.  Appreciate SLP evaluation with barium esophagram ordered for further evaluation. 3. Acute encephalopathy with hallucinations and lethargy- resolved. Stopped steroidson 1/2.  Low-dose trazodone used at night with good improvement in sleep noted.  She is more alert and awake this morning.  Will resume home Wellbutrin dose as well. 4. Cough.  Will use Mucinex for cough as well as Chloraseptic spray to help with sore throat.  Will reevaluate further with recheck on CBC in a.m. and see if she needs treatment for any acute bronchitis. 5. COPD.  Appears to be breathing comfortably on room air. Continue bronchodilators and stop prednisone.  Cough medication added today. 6. Chronic atrial fibrillation. Continue rate control medications. Anticoagulated with apixaban. 7. Chronic diastolic CHF. Appears to be compensated at this time. Resume oral Lasix. 8. History of LV thrombus. Continue anticoagulation with apixaban. 9. Hypertension. Blood pressure currently stable. Continue on Lopressor and diltiazem 10. Generalized weakness/deconditioning. Will need skilled nursing facility placement on discharge.PT has reevaluated on 1/3. 11. Vaginal candidiasis.  Given 1 dose of fluconazole today.  DVT prophylaxis:Apixaban Code Status:DNR Family Communication:No family present Disposition Plan:Skilled nursing facility placementpending.Barium esophagram evaluation pending.   Consultants:  SLP  Procedures:  None  Antimicrobials:   None  Subjective: Patient seen and evaluated today with no new acute complaints or concerns. No acute concerns or events noted overnight.  She is more awake and alert and overall doing well with the exception of some difficulty with swallowing especially some pieces of meat and bread.  Objective: Vitals:   08/22/18 2114 08/23/18 0500 08/23/18 0906 08/23/18 1055  BP: (!) 129/53  (!) 121/49 128/60  Pulse: 75  74 81  Resp: 18     Temp: 98.4 F (36.9 C)     TempSrc: Oral     SpO2: 98%  100% 95%  Weight:  101.6 kg    Height:        Intake/Output Summary (Last 24 hours) at 08/23/2018 1430 Last data filed at 08/23/2018 0900 Gross per 24 hour  Intake 480 ml  Output 1150 ml  Net -670 ml   Filed Weights   08/21/18 0612 08/22/18 0500 08/23/18 0500  Weight: 104.9 kg 103.4 kg 101.6 kg    Examination:  General exam: Appears calm and  comfortable  Respiratory system: Clear to auscultation. Respiratory effort normal. Cardiovascular system: S1 & S2 heard, RRR. No JVD, murmurs, rubs,  gallops or clicks. No pedal edema. Gastrointestinal system: Abdomen is nondistended, soft and nontender. No organomegaly or masses felt. Normal bowel sounds heard. Central nervous system: Alert and oriented. No focal neurological deficits. Extremities: Symmetric 5 x 5 power. Skin: No rashes, lesions or ulcers Psychiatry: Judgement and insight appear normal. Mood & affect appropriate.     Data Reviewed: I have personally reviewed following labs and imaging studies  CBC: Recent Labs  Lab 08/16/18 1848 08/17/18 0548 08/18/18 0414 08/23/18 0533  WBC 9.3 9.8 9.1 10.2  NEUTROABS 8.6*  --   --   --   HGB 9.9* 9.4* 9.2* 10.2*  HCT 34.0* 31.9* 31.7* 34.4*  MCV 90.7 91.7 90.1 90.8  PLT 260 222 238 791   Basic Metabolic Panel: Recent Labs  Lab 08/16/18 1848 08/17/18 0548 08/18/18 0414 08/19/18 0515 08/23/18 0533  NA 135 139 139 137 139  K 4.1 3.7 3.6 3.7 3.4*  CL 100 103 104 100 98  CO2 26 28 27 28  34*  GLUCOSE 508* 314* 157* 309* 96  BUN 25* 21 19 19 17   CREATININE 1.03* 0.89 0.72 0.81 0.84  CALCIUM 8.5* 8.3* 8.3* 8.1* 7.9*   GFR: Estimated Creatinine Clearance: 75.4 mL/min (by C-G formula based on SCr of 0.84 mg/dL). Liver Function Tests: Recent Labs  Lab 08/16/18 1848  AST 24  ALT 17  ALKPHOS 51  BILITOT 0.7  PROT 6.6  ALBUMIN 3.1*   No results for input(s): LIPASE, AMYLASE in the last 168 hours. Recent Labs  Lab 08/19/18 1026  AMMONIA <9*   Coagulation Profile: No results for input(s): INR, PROTIME in the last 168 hours. Cardiac Enzymes: Recent Labs  Lab 08/16/18 1848  TROPONINI <0.03   BNP (last 3 results) No results for input(s): PROBNP in the last 8760 hours. HbA1C: No results for input(s): HGBA1C in the last 72 hours. CBG: Recent Labs  Lab 08/22/18 1116 08/22/18 1651 08/22/18 2204 08/23/18 0744 08/23/18 1216  GLUCAP 151* 259* 121* 119* 171*   Lipid Profile: No results for input(s): CHOL, HDL, LDLCALC, TRIG, CHOLHDL, LDLDIRECT in the  last 72 hours. Thyroid Function Tests: No results for input(s): TSH, T4TOTAL, FREET4, T3FREE, THYROIDAB in the last 72 hours. Anemia Panel: No results for input(s): VITAMINB12, FOLATE, FERRITIN, TIBC, IRON, RETICCTPCT in the last 72 hours. Sepsis Labs: No results for input(s): PROCALCITON, LATICACIDVEN in the last 168 hours.  No results found for this or any previous visit (from the past 240 hour(s)).       Radiology Studies: No results found.      Scheduled Meds: . apixaban  5 mg Oral BID  . buPROPion  300 mg Oral Daily  . dextromethorphan-guaiFENesin  1 tablet Oral BID  . diltiazem  240 mg Oral Daily  . escitalopram  5 mg Oral Daily  . furosemide  40 mg Oral Daily  . insulin aspart  0-20 Units Subcutaneous TID WC  . insulin aspart  0-5 Units Subcutaneous QHS  . insulin aspart  4 Units Subcutaneous TID WC  . insulin detemir  10 Units Subcutaneous BID  . metoprolol tartrate  50 mg Oral BID  . sodium chloride flush  3 mL Intravenous Q12H  . traZODone  25 mg Oral QHS  . umeclidinium bromide  2 puff Inhalation Daily   Continuous Infusions:   LOS: 4 days    Time spent: 30  minutes    Katrina Keagle Darleen Crocker, DO [facility] Pager [phone]  If 7PM-7AM, please contact night-coverage [email or web address] Password [email or web address] 08/23/2018, 2:30 PM

## 2018-08-23 NOTE — Progress Notes (Signed)
Hypoglycemic Event  CBG: 60    Treatment: 8 oz juice/soda  Symptoms: Nervous/irritable  Follow-up CBG: Time: 1312 CBG Result:84  Possible Reasons for Event: Inadequate meal intake  Comments/MD notified: Dr. Manuella Ghazi notified.    [name]

## 2018-08-23 NOTE — Clinical Social Work Note (Signed)
Facility has [facility]. Advised facility that discharge will likely be tomorrow due to patient having a swallow evaluation.    Esley Brooking, Clydene Pugh, LCSW

## 2018-08-23 NOTE — Progress Notes (Signed)
Patient complaining of vaginal itching at shift change, states "it feels like I'm getting a yeast infection." RN assessed and cleansed perineal area. No evidence of vaginal odor or discharge present. Some MASD predent. MD made aware.

## 2018-08-23 NOTE — Evaluation (Signed)
Clinical/Bedside Swallow Evaluation Patient Details  Name: [name] MRN: [id number] Date of Birth: 1950/08/22  Today's Date: 08/23/2018 Time: SLP Start Time (ACUTE ONLY): 1145 SLP Stop Time (ACUTE ONLY): 1210 SLP Time Calculation (min) (ACUTE ONLY): 25 min  Past Medical History:  Past Medical History:  Diagnosis Date  . Atrial fibrillation ([place])   . CHF (congestive heart failure) ([place])   . Depression   . Diabetes mellitus without complication ([place])   . 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 Hypertension   . Morbid obesity ([place])   . Osteoporosis   . UTI (lower urinary tract infection) 01/2016   Cipro for Klebsiella pneumoniae isolate   Past Surgical History:  Past Surgical History:  Procedure Laterality Date  . ABDOMINAL HYSTERECTOMY    . CESAREAN SECTION    . CHOLECYSTECTOMY    . FEMUR IM NAIL Left 02/20/2016  . FEMUR IM NAIL Left 02/20/2016   Procedure: INTRAMEDULLARY (IM) RETROGRADE FEMORAL NAILING;  Surgeon: Katrina Koyanagi, MD;  Location: [facility];  Service: Orthopedics;  Laterality: Left;  . PANCREAS SURGERY  1967   1 cyst excised and one cyst drained  . TEE WITHOUT CARDIOVERSION N/A 04/05/2018   Procedure: TRANSESOPHAGEAL ECHOCARDIOGRAM (TEE);  Surgeon: Katrina Spark, MD;  Location: [facility];  Service: Cardiovascular;  Laterality: N/A;  . [place]    . WRIST FRACTURE SURGERY     HPI:  68 year old female with a history of diastolic CHF, atrial fibrillation on anticoagulation, COPD, hypertension, diabetes, was recently in the hospital after being treated for CHF/COPD exacerbation.  She returned to the hospital with hyperglycemia secondary to steroids and persistent wheezing.  She was admitted to the hospital started on sliding scale insulin.  She is continued on prednisone taper for COPD.  Volume status appears to be compensated.  Seen by physical therapy with recommendations for skilled nursing facility placement.  She no longer has any sedation, but does have a mild cough this morning. BSE requested as Pt reported difficullty swallowing meats and medications over the past few days.   Assessment / Plan / Recommendation Clinical Impression  Clinical swallow evaluation completed at bedside. Oral motor examination is WNL with the exception of Pt missing some dentition. Pt with congested cough prior to po, which continued intermittently throughout po trials. Pt indicates new onset globus sensation, difficulty swallowing pills and meats, sore throat, and regurgitation in the past 4 days without history of the same previously. SLP suspects esophageal component to her dysphagia, possibly esophagitis given h/o ABX and steroids, however oral cavity remains clear vs esophageal motility difficulty. SLP discussed with Dr. Manuella Torres and barium pill esophagram will be ordered and SLP will follow up pending those results. Will change to D3/mech soft and thin for now.   SLP Visit Diagnosis: Dysphagia, unspecified (R13.10)    Aspiration Risk  Mild aspiration risk    Diet Recommendation Dysphagia 3 (Mech soft);Thin liquid   Liquid Administration via: Cup;Straw Medication Administration: Whole meds with liquid(Pt reports preference whole in puree for now) Supervision: Patient able to self feed Compensations: Small sips/bites Postural Changes: Seated upright at 90 degrees;Remain upright for at least 30 minutes after po intake    Other  Recommendations Recommended Consults: Consider esophageal assessment Oral Care Recommendations: Oral care BID;Patient independent with oral care Other Recommendations: Clarify dietary restrictions   Follow up Recommendations (pending)      Frequency and Duration min 2x/week  1 week  Prognosis Prognosis for Safe Diet Advancement: Good Barriers to Reach Goals: (? esophagitis or other esophageal component)      Swallow Study   General Date of Onset: 08/19/18 HPI: 68 year old  female with a history of diastolic CHF, atrial fibrillation on anticoagulation, COPD, hypertension, diabetes, was recently in the hospital after being treated for CHF/COPD exacerbation.  She returned to the hospital with hyperglycemia secondary to steroids and persistent wheezing.  She was admitted to the hospital started on sliding scale insulin.  She is continued on prednisone taper for COPD.  Volume status appears to be compensated.  Seen by physical therapy with recommendations for skilled nursing facility placement. She no longer has any sedation, but does have a mild cough this morning. BSE requested as Pt reported difficullty swallowing meats and medications over the past few days. Type of Study: Bedside Swallow Evaluation Previous Swallow Assessment: None on record Diet Prior to this Study: Regular;Thin liquids Temperature Spikes Noted: No Respiratory Status: Room air History of Recent Intubation: No Behavior/Cognition: Alert;Cooperative;Pleasant mood Oral Cavity Assessment: Within Functional Limits Oral Care Completed by SLP: No Oral Cavity - Dentition: Missing dentition Vision: Functional for self-feeding Self-Feeding Abilities: Able to feed self Patient Positioning: Upright in bed Baseline Vocal Quality: Normal(some wheezing heard) Volitional Cough: Strong;Congested Volitional Swallow: Able to elicit    Oral/Motor/Sensory Function Overall Oral Motor/Sensory Function: Within functional limits   Ice Chips Ice chips: Within functional limits Presentation: Spoon   Thin Liquid Thin Liquid: Within functional limits Presentation: Cup;Self Fed;Straw Other Comments: (delayed congested cough, however this was present throughout)    Nectar Thick Nectar Thick Liquid: Not tested   Honey Thick Honey Thick Liquid: Not tested   Puree Puree: Within functional limits Presentation: Self Fed;Spoon   Solid     Solid: Within functional limits Presentation: Self Fed     Thank you,  [name], [place]  [name] 08/23/2018,12:18 PM

## 2018-08-24 DIAGNOSIS — J441 Chronic obstructive pulmonary disease with (acute) exacerbation: Secondary | ICD-10-CM | POA: Diagnosis not present

## 2018-08-24 DIAGNOSIS — E1165 Type 2 diabetes mellitus with hyperglycemia: Secondary | ICD-10-CM | POA: Diagnosis not present

## 2018-08-24 DIAGNOSIS — I959 Hypotension, unspecified: Secondary | ICD-10-CM | POA: Diagnosis not present

## 2018-08-24 DIAGNOSIS — I5032 Chronic diastolic (congestive) heart failure: Secondary | ICD-10-CM | POA: Diagnosis not present

## 2018-08-24 DIAGNOSIS — K224 Dyskinesia of esophagus: Secondary | ICD-10-CM | POA: Diagnosis not present

## 2018-08-24 DIAGNOSIS — F339 Major depressive disorder, recurrent, unspecified: Secondary | ICD-10-CM | POA: Diagnosis not present

## 2018-08-24 DIAGNOSIS — M6281 Muscle weakness (generalized): Secondary | ICD-10-CM | POA: Diagnosis not present

## 2018-08-24 DIAGNOSIS — Z7401 Bed confinement status: Secondary | ICD-10-CM | POA: Diagnosis not present

## 2018-08-24 DIAGNOSIS — E860 Dehydration: Secondary | ICD-10-CM | POA: Diagnosis not present

## 2018-08-24 DIAGNOSIS — S82401D Unspecified fracture of shaft of right fibula, subsequent encounter for closed fracture with routine healing: Secondary | ICD-10-CM | POA: Diagnosis not present

## 2018-08-24 DIAGNOSIS — M2550 Pain in unspecified joint: Secondary | ICD-10-CM | POA: Diagnosis not present

## 2018-08-24 DIAGNOSIS — I4891 Unspecified atrial fibrillation: Secondary | ICD-10-CM | POA: Diagnosis not present

## 2018-08-24 DIAGNOSIS — R1314 Dysphagia, pharyngoesophageal phase: Secondary | ICD-10-CM | POA: Diagnosis not present

## 2018-08-24 DIAGNOSIS — E119 Type 2 diabetes mellitus without complications: Secondary | ICD-10-CM | POA: Diagnosis not present

## 2018-08-24 DIAGNOSIS — I10 Essential (primary) hypertension: Secondary | ICD-10-CM | POA: Diagnosis not present

## 2018-08-24 DIAGNOSIS — J449 Chronic obstructive pulmonary disease, unspecified: Secondary | ICD-10-CM | POA: Diagnosis not present

## 2018-08-24 DIAGNOSIS — R262 Difficulty in walking, not elsewhere classified: Secondary | ICD-10-CM | POA: Diagnosis not present

## 2018-08-24 DIAGNOSIS — Z86718 Personal history of other venous thrombosis and embolism: Secondary | ICD-10-CM | POA: Diagnosis not present

## 2018-08-24 LAB — GLUCOSE, CAPILLARY
Glucose-Capillary: 166 mg/dL — ABNORMAL HIGH (ref 70–99)
Glucose-Capillary: 292 mg/dL — ABNORMAL HIGH (ref 70–99)

## 2018-08-24 MED ORDER — BENZONATATE 100 MG PO CAPS: 100.0000 mg | ORAL_CAPSULE | Freq: Four times a day (QID) | ORAL | 0 refills | Status: DC | PRN

## 2018-08-24 MED ORDER — BENZONATATE 100 MG PO CAPS
100.0000 mg | ORAL_CAPSULE | Freq: Once | ORAL | Status: AC
  Administered 2018-08-24: 100 mg via ORAL
  Filled 2018-08-24: qty 1

## 2018-08-24 MED ORDER — TRAZODONE HCL 50 MG PO TABS: 25.0000 mg | ORAL_TABLET | Freq: Every day | ORAL | 0 refills | Status: DC

## 2018-08-24 MED ORDER — DM-GUAIFENESIN ER 30-600 MG PO TB12: 1.0000 | ORAL_TABLET | Freq: Two times a day (BID) | ORAL | 0 refills | Status: DC

## 2018-08-24 MED ORDER — ESCITALOPRAM OXALATE 5 MG PO TABS: 5.0000 mg | ORAL_TABLET | Freq: Every day | ORAL | 0 refills | Status: DC

## 2018-08-24 NOTE — Progress Notes (Signed)
  Speech Language Pathology Treatment: Dysphagia  Patient Details Name: [name] MRN: [id number] DOB: August 13, 1951 Today's Date: 08/24/2018 Time: 6073-7106 SLP Time Calculation (min) (ACUTE ONLY): 10 min  Assessment / Plan / Recommendation Clinical Impression  Pt seen in room as a follow up from barium pill esophagram completed yesterday. The Pt was unable to swallow the pill during the study. The study showed dysmotility, but was otherwise unremarkable. The Pt reports that she again felt globus sensation during lunch meal of cottage cheese and fruit. She also endorses xerostomia, which can make initiating a swallow difficult. SLP provided education regarding esophageal and reflux precautions. Pt was encouraged to sit upright for all meals and remain upright for at least 30 minutes following, consume smaller more frequent meals, sip on liquids/add moisture to foods, and alternate solids and liquids. Pt verbalizes understanding. SLP will sign off as she is discharging to [facility] in [place].   HPI HPI: 68 year old female with a history of diastolic CHF, atrial fibrillation on anticoagulation, COPD, hypertension, diabetes, was recently in the hospital after being treated for CHF/COPD exacerbation.  She returned to the hospital with hyperglycemia secondary to steroids and persistent wheezing.  She was admitted to the hospital started on sliding scale insulin.  She is continued on prednisone taper for COPD.  Volume status appears to be compensated.  Seen by physical therapy with recommendations for skilled nursing facility placement. She no longer has any sedation, but does have a mild cough this morning. BSE requested as Pt reported difficullty swallowing meats and medications over the past few days.      SLP Plan  Discharge SLP treatment due to (comment)(Pt discharging to SNF today)       Recommendations  Diet recommendations: Dysphagia 3 (mechanical soft);Thin liquid Liquids provided via:  Cup;Straw Medication Administration: Whole meds with liquid Supervision: Patient able to self feed Compensations: Small sips/bites;Follow solids with liquid Postural Changes and/or Swallow Maneuvers: Out of bed for meals;Seated upright 90 degrees;Upright 30-60 min after meal                Oral Care Recommendations: Oral care BID;Patient independent with oral care Follow up Recommendations: Skilled Nursing facility SLP Visit Diagnosis: Dysphagia, unspecified (R13.10) Plan: Discharge SLP treatment due to (comment)(Pt discharging to SNF today)       Thank you,  Genene Churn, [place]                 Louisa 08/24/2018, 2:07 PM

## 2018-08-24 NOTE — Progress Notes (Signed)
Report called to [facility] at [facility], [facility] called for transport. [id number]

## 2018-08-24 NOTE — NC FL2 (Signed)
[place] LEVEL OF CARE SCREENING TOOL     IDENTIFICATION  Patient Name: [name] Birthdate: April 07, 1951 Sex: female Admission Date (Current Location): 08/16/2018  [facility] and [place] Number:  [employer] and Address:  [facility] [address], [place]      Provider Number: [id number]  Attending Physician Name and Address:  Rodena Goldmann, DO  Relative Name and Phone Number:       Current Level of Care: Other (Comment)(observation) Recommended Level of Care: [place] Prior Approval Number:    Date Approved/Denied:   PASRR Number: [id number])  Discharge Plan: SNF    Current Diagnoses: Patient Active Problem List   Diagnosis Date Noted  . Dehydration 08/16/2018  . Acute on chronic diastolic CHF (congestive heart failure) ([place]) 08/10/2018  . Acute lower UTI 08/10/2018  . Hypokalemia 08/10/2018  . COPD (chronic obstructive pulmonary disease) ([place]) 08/10/2018  . Thyroid lesion 08/10/2018  . Closed fracture of right ankle 06/10/2018  . Closed fracture of left tibia and fibula with routine healing 04/13/18 06/10/2018  . Unspecified atrial fibrillation ([place]) 04/15/2018  . Chronic diastolic CHF (congestive heart failure) ([place]) 04/15/2018  . CKD (chronic kidney disease), stage III ([place]) 04/15/2018  . Closed right fibular fracture 04/15/2018  . Left tibial fracture 04/14/2018  . CHF exacerbation ([place]) 04/10/2018  . SOB (shortness of breath)   . Acute CHF (congestive heart failure) ([place]) 04/01/2018  . Atrial fibrillation with RVR ([place]) 04/01/2018  . [place] 03/11/2016  . Muscle spasms of lower extremity 03/04/2016  . Acute renal insufficiency 02/26/2016  . History of MRSA infection 02/26/2016  . Diabetes type 2, uncontrolled ([place]) 02/19/2016  . HTN (hypertension) 02/19/2016  . Fracture, femur, distal ([place]) 02/19/2016  . Fall 02/19/2016  . Depression 02/19/2016  . Closed left femoral  fracture, initial encounter 02/19/2016  . Femur fracture, left ([place]) 02/19/2016    Orientation RESPIRATION BLADDER Height & Weight     Self, Time, Situation, Place  Normal Continent Weight: 225 lb 1.4 oz (102.1 kg) Height:  5' 4"  (162.6 cm)  BEHAVIORAL SYMPTOMS/MOOD NEUROLOGICAL BOWEL NUTRITION STATUS      Continent Diet(DYS 3 low sodium/heart healthy)  AMBULATORY STATUS COMMUNICATION OF NEEDS Skin   Limited Assist Verbally Normal                       Personal Care Assistance Level of Assistance  Bathing, Feeding, Dressing Bathing Assistance: Limited assistance Feeding assistance: Independent Dressing Assistance: Limited assistance     Functional Limitations Info  Sight, Hearing, Speech Sight Info: Adequate Hearing Info: Adequate Speech Info: Adequate    SPECIAL CARE FACTORS FREQUENCY  Speech therapy     PT Frequency: 5x/week       Speech Therapy Frequency: 3x/week      Contractures Contractures Info: Not present    Additional Factors Info  Code Status, Allergies, Psychotropic Code Status Info: Full Code Allergies Info: Latex Psychotropic Info: Wellbutrin, Desyrel         Current Medications (08/24/2018):  This is the current hospital active medication list Current Facility-Administered Medications  Medication Dose Route Frequency Provider Last Rate Last Dose  . acetaminophen (TYLENOL) tablet 650 mg  650 mg Oral Q6H PRN Lenore Cordia, MD   650 mg at 08/23/18 2342   Or  . acetaminophen (TYLENOL) suppository 650 mg  650 mg Rectal Q6H PRN Lenore Cordia, MD      . apixaban (  ELIQUIS) tablet 5 mg  5 mg Oral BID Lenore Cordia, MD   5 mg at 08/24/18 0901  . buPROPion (WELLBUTRIN XL) 24 hr tablet 300 mg  300 mg Oral Daily Manuella Ghazi, Pratik D, DO   300 mg at 08/24/18 0901  . dextromethorphan-guaiFENesin (MUCINEX DM) 30-600 MG per 12 hr tablet 1 tablet  1 tablet Oral BID Manuella Ghazi, Pratik D, DO   1 tablet at 08/24/18 0901  . diltiazem (CARDIZEM CD) 24 hr capsule 240 mg   240 mg Oral Daily Zada Finders R, MD   240 mg at 08/24/18 0901  . docusate sodium (COLACE) capsule 100 mg  100 mg Oral BID PRN Lenore Cordia, MD   100 mg at 08/20/18 6440  . escitalopram (LEXAPRO) tablet 5 mg  5 mg Oral Daily Manuella Ghazi, Pratik D, DO   5 mg at 08/24/18 0900  . furosemide (LASIX) tablet 40 mg  40 mg Oral Daily Kathie Dike, MD   40 mg at 08/24/18 0901  . insulin aspart (novoLOG) injection 0-20 Units  0-20 Units Subcutaneous TID WC Lenore Cordia, MD   4 Units at 08/24/18 0900  . insulin aspart (novoLOG) injection 0-5 Units  0-5 Units Subcutaneous QHS Lenore Cordia, MD   3 Units at 08/23/18 2211  . insulin aspart (novoLOG) injection 4 Units  4 Units Subcutaneous TID WC Shah, Pratik D, DO   4 Units at 08/23/18 1238  . insulin detemir (LEVEMIR) injection 10 Units  10 Units Subcutaneous BID Heath Lark D, DO   10 Units at 08/23/18 2211  . ipratropium-albuterol (DUONEB) 0.5-2.5 (3) MG/3ML nebulizer solution 3 mL  3 mL Nebulization Q6H PRN Manuella Ghazi, Pratik D, DO   3 mL at 08/23/18 1102  . metoprolol tartrate (LOPRESSOR) tablet 50 mg  50 mg Oral BID Lenore Cordia, MD   50 mg at 08/24/18 0901  . nystatin (MYCOSTATIN/NYSTOP) topical powder   Topical TID PRN Schorr, Rhetta Mura, NP      . ondansetron (ZOFRAN) tablet 4 mg  4 mg Oral Q6H PRN Lenore Cordia, MD       Or  . ondansetron (ZOFRAN) injection 4 mg  4 mg Intravenous Q6H PRN Zada Finders R, MD      . phenol (CHLORASEPTIC) mouth spray 1 spray  1 spray Mouth/Throat PRN Manuella Ghazi, Pratik D, DO   1 spray at 08/22/18 1311  . sodium chloride flush (NS) 0.9 % injection 3 mL  3 mL Intravenous Q12H Zada Finders R, MD   3 mL at 08/24/18 0902  . traZODone (DESYREL) tablet 25 mg  25 mg Oral QHS Shah, Pratik D, DO   25 mg at 08/23/18 2211  . umeclidinium bromide (INCRUSE ELLIPTA) 62.5 MCG/INH 2 puff  2 puff Inhalation Daily Lenore Cordia, MD   2 puff at 08/24/18 3474     Discharge Medications: Please see discharge summary for a list of  discharge medications.  Relevant Imaging Results:  Relevant Lab Results:   Additional Information SSN: [id number]  Ihor Gully, LCSW

## 2018-08-24 NOTE — Progress Notes (Signed)
Physical Therapy Treatment Patient Details Name: JAYLIN ROUNDY MRN: [id number] DOB: [birth year] Today's Date: 08/24/2018    History of Present Illness [place] Bousquet is a 68 y.o. female with medical history significant for chronic diastolic CHF EF 62-83%, atrial fibrillation and LV thrombus on Eliquis, type 2 diabetes, COPD, hypertension, and depression who presents to the ED with 2 days of shortness of breath with wheezing, dry mouth and throat, and generalized weakness.    PT Comments    Patient demonstrates increased BLE strength for sit to stands, taking steps, able to transfer to [facility] prior to attempting ambulation, limited for gait training due to c/o fatigue and tolerated sitting up in chair after therapy.  Patient will benefit from continued physical therapy in hospital and recommended venue below to increase strength, balance, endurance for safe ADLs and gait.   Follow Up Recommendations  SNF     Equipment Recommendations       Recommendations for Other Services       Precautions / Restrictions Precautions Precautions: Fall Restrictions Weight Bearing Restrictions: No    Mobility  Bed Mobility Overal bed mobility: Needs Assistance Bed Mobility: Supine to Sit     Supine to sit: Min guard     General bed mobility comments: had to use bed rail, increased time  Transfers Overall transfer level: Needs assistance Equipment used: Rolling walker (2 wheeled) Transfers: Sit to/from [facility] Sit to Stand: Min assist Stand pivot transfers: Min assist;Mod assist       General transfer comment: slow labored movement  Ambulation/Gait Ambulation/Gait assistance: Mod assist Gait Distance (Feet): 5 Feet Assistive device: Rolling walker (2 wheeled) Gait Pattern/deviations: Decreased step length - right;Decreased step length - left;Decreased stride length Gait velocity: slow   General Gait Details: limited to 8-9 slow unsteady labored steps at bedside  due to fatigue/generalized weakness   Stairs             Wheelchair Mobility    Modified Rankin (Stroke Patients Only)       Balance Overall balance assessment: Needs assistance Sitting-balance support: Feet supported;No upper extremity supported Sitting balance-Leahy Scale: Good     Standing balance support: Bilateral upper extremity supported;During functional activity Standing balance-Leahy Scale: Fair Standing balance comment: using RW                            Cognition Arousal/Alertness: Awake/alert Behavior During Therapy: WFL for tasks assessed/performed Overall Cognitive Status: Within Functional Limits for tasks assessed                                        Exercises General Exercises - Lower Extremity Long Arc Quad: Seated;AROM;Strengthening;Both;10 reps Hip Flexion/Marching: Seated;Strengthening;AROM;Both;10 reps Toe Raises: Seated;Strengthening;AROM;Both;10 reps Heel Raises: Seated;AROM;Strengthening;Both;10 reps    General Comments        Pertinent Vitals/Pain Pain Assessment: No/denies pain    Home Living                      Prior Function            PT Goals (current goals can now be found in the care plan section) Acute Rehab PT Goals Patient Stated Goal: return home after rehab PT Goal Formulation: With patient Time For Goal Achievement: 08/27/18 Potential to Achieve Goals: Good Progress towards PT goals: Progressing toward goals  Frequency    Min 3X/week      PT Plan Current plan remains appropriate    Co-evaluation              AM-PAC PT "6 Clicks" Mobility   Outcome Measure  Help needed turning from your back to your side while in a flat bed without using bedrails?: A Little Help needed moving from lying on your back to sitting on the side of a flat bed without using bedrails?: A Little(had to use bedrail) Help needed moving to and from a bed to a chair (including a  wheelchair)?: Total Help needed standing up from a chair using your arms (e.g., wheelchair or bedside chair)?: A Lot Help needed to walk in hospital room?: A Lot Help needed climbing 3-5 steps with a railing? : Total 6 Click Score: 12    End of Session   Activity Tolerance: Patient tolerated treatment well;Patient limited by fatigue Patient left: in chair;with call bell/phone within reach Nurse Communication: Mobility status PT Visit Diagnosis: Unsteadiness on feet (R26.81);Other abnormalities of gait and mobility (R26.89);Muscle weakness (generalized) (M62.81)     Time: 2820-6015 PT Time Calculation (min) (ACUTE ONLY): 27 min  Charges:  $Therapeutic Exercise: 8-22 mins $Therapeutic Activity: 8-22 mins                     12:36 PM, 08/24/18 Lonell Grandchild, MPT Physical Therapist with [facility] [phone] office [phone] mobile phone

## 2018-08-24 NOTE — Care Management Note (Signed)
Case Management Note  Patient Details  Name: [name] MRN: [id number] Date of Birth: 1951/07/14  If discussed at [place] of Stay Meetings, dates discussed:  08/24/2018  Additional Comments:  Sherald Barge, RN 08/24/2018, 12:46 PM

## 2018-08-24 NOTE — Progress Notes (Signed)
Pt transported via cart with transporters x2 at cart side, all belongings and discharge packet taken by transporters  [id number].

## 2018-08-24 NOTE — Clinical Social Work Placement (Signed)
   CLINICAL SOCIAL WORK PLACEMENT  NOTE  Date:  08/24/2018  Patient Details  Name: [name] MRN: [id number] Date of Birth: 1950-09-07  Clinical Social Work is seeking post-discharge placement for this patient at the   level of care (*CSW will initial, date and re-position this form in  chart as items are completed):  Yes   Patient/family provided with [place] Work Department's list of facilities offering this level of care within the geographic area requested by the patient (or if unable, by the patient's family).  Yes   Patient/family informed of their freedom to choose among providers that offer the needed level of care, that participate in Medicare, Medicaid or managed care program needed by the patient, have an available bed and are willing to accept the patient.  Yes   Patient/family informed of [place]'s ownership interest in [facility] and [facility], as well as of the fact that they are under no obligation to receive care at these facilities.  PASRR submitted to EDS on       PASRR number received on       Existing PASRR number confirmed on 08/17/18     FL2 transmitted to all facilities in geographic area requested by pt/family on 08/17/18     FL2 transmitted to all facilities within larger geographic area on       Patient informed that his/her managed care company has contracts with or will negotiate with certain facilities, including the following:        Yes   Patient/family informed of bed offers received.  Patient chooses bed at [facility]     Physician recommends and patient chooses bed at      Patient to be [facility] on 08/24/18.  Patient to be transferred to facility by [facility]     Patient family notified on 08/24/18 of transfer.  Name of family member notified:  spouse, Feleica Fulmore     PHYSICIAN       Additional Comment:  Discharge clinicals sent to facility. RN to arrange transport. LCSW  signing off.   _______________________________________________ Ihor Gully, LCSW 08/24/2018, 11:22 AM

## 2018-08-24 NOTE — Discharge Summary (Signed)
Physician Discharge Summary  [name] QJF:[id number] DOB: 31-Dec-1950 DOA: 08/16/2018  PCP: [name], MD  Admit date: 08/16/2018  Discharge date: 08/24/2018  Admitted From: Home  Disposition:  SNF  Recommendations for Outpatient Follow-up:  1. Follow up with PCP in 1-2 weeks 2. Continue on dysphagia 3 diet  Home Health: None  Equipment/Devices: None  Discharge Condition: Stable  CODE STATUS: DNR  Diet recommendation: Dysphagia 3  Brief/Interim Summary: Per HPI: 68 year old female with a history of diastolic CHF, atrial fibrillation on anticoagulation, COPD, hypertension, diabetes, was recently in the hospital after being treated for CHF/COPD exacerbation. She returned to the hospital with hyperglycemia secondary to steroids and persistent wheezing. She was admitted to the hospital started on sliding scale insulin. She is continued on prednisone taper for COPD. Volume status appears to be compensated. Seen by physical therapy with recommendations for skilled nursing facility placement. She no longer has any sedation, but does have a mild cough this morning.  Patient was readmitted to the hospital with encephalopathy along with hyperglycemia which have now both resolved.  She has resumed her usual home medications to include Wellbutrin and Lexapro as well as trazodone at a dose of now 12.5 mg at night as opposed to previously 100 mg.  She has been seen by PT and has been recommended for SNF/rehab and has been awaiting approval for discharge.  She did have some coughing and trouble swallowing for which she underwent a barium pill esophagram demonstrating some mild esophageal dysmotility, but no other findings.  She has been recommended to remain on a dysphagia 3 diet at this time.  She denies any other complaints or concerns and has had no other acute events during the course of this admission.  Discharge Diagnoses:  Principal Problem:   Dehydration Active  Problems:   Diabetes type 2, uncontrolled (HCC)   HTN (hypertension)   Depression   Unspecified atrial fibrillation (HCC)   Chronic diastolic CHF (congestive heart failure) (HCC)   COPD (chronic obstructive pulmonary disease) ([place])  Principal discharge diagnosis: Acute encephalopathy with hallucinations and lethargy along with uncontrolled diabetes and hyperglycemia.  Discharge Instructions  Discharge Instructions    Diet - low sodium heart healthy   Complete by:  As directed    Increase activity slowly   Complete by:  As directed      Allergies as of 08/24/2018      Reactions   Latex Itching, Rash      Medication List    STOP taking these medications   levofloxacin 750 MG tablet Commonly known as:  LEVAQUIN   predniSONE 10 MG tablet Commonly known as:  DELTASONE     TAKE these medications   acetaminophen 500 MG tablet Commonly known as:  TYLENOL Take 1,500-2,000 mg by mouth every 6 (six) hours as needed for mild pain or headache.   albuterol 108 (90 Base) MCG/ACT inhaler Commonly known as:  PROVENTIL HFA;VENTOLIN HFA Inhale 2 puffs into the lungs every 6 (six) hours as needed for wheezing or shortness of breath.   apixaban 5 MG Tabs tablet Commonly known as:  ELIQUIS Take 1 tablet (5 mg total) by mouth 2 (two) times daily.   benzonatate 100 MG capsule Commonly known as:  TESSALON PERLES Take 1 capsule (100 mg total) by mouth every 6 (six) hours as needed for cough.   buPROPion 300 MG 24 hr tablet Commonly known as:  WELLBUTRIN XL Take 300 mg by mouth daily.   dextromethorphan-guaiFENesin 30-600 MG 12hr tablet  Commonly known as:  MUCINEX DM Take 1 tablet by mouth 2 (two) times daily. What changed:  Another medication with the same name was added. Make sure you understand how and when to take each.   dextromethorphan-guaiFENesin 30-600 MG 12hr tablet Commonly known as:  MUCINEX DM Take 1 tablet by mouth 2 (two) times daily for 10 days. What changed:  You  were already taking a medication with the same name, and this prescription was added. Make sure you understand how and when to take each.   diltiazem 240 MG 24 hr capsule Commonly known as:  CARDIZEM CD Take 1 capsule (240 mg total) by mouth daily.   escitalopram 5 MG tablet Commonly known as:  LEXAPRO Take 1 tablet (5 mg total) by mouth daily. Start taking on:  August 25, 2018   fluticasone 50 MCG/ACT nasal spray Commonly known as:  FLONASE Place 2 sprays into both nostrils daily.   furosemide 40 MG tablet Commonly known as:  LASIX Take 1 tablet (40 mg total) by mouth daily.   glipiZIDE 10 MG tablet Commonly known as:  GLUCOTROL Take 10 mg by mouth 2 (two) times daily.   levalbuterol 0.63 MG/3ML nebulizer solution Commonly known as:  XOPENEX Take 3 mLs (0.63 mg total) by nebulization every 6 (six) hours as needed for wheezing or shortness of breath.   loratadine 10 MG tablet Commonly known as:  CLARITIN Take 10 mg by mouth daily.   metoprolol tartrate 50 MG tablet Commonly known as:  LOPRESSOR Take 1 tablet (50 mg total) by mouth 2 (two) times daily.   pioglitazone 45 MG tablet Commonly known as:  ACTOS Take 45 mg by mouth daily.   potassium chloride SA 20 MEQ tablet Commonly known as:  K-DUR,KLOR-CON Take 1 tablet (20 mEq total) by mouth daily.   SPIRIVA RESPIMAT 1.25 MCG/ACT Aers Generic drug:  Tiotropium Bromide Monohydrate Inhale 2 sprays into the lungs daily.   traZODone 50 MG tablet Commonly known as:  DESYREL Take 0.5 tablets (25 mg total) by mouth at bedtime. What changed:    medication strength  how much to take  when to take this  reasons to take this       Contact information for follow-up providers    [name], MD Follow up in 2 week(s).   Specialty:  Internal Medicine Contact information: [address] [place] [address] [phone]        Herminio Commons, MD .   Specialty:  Cardiology Contact  information: [place] [address] [phone]            Contact information for after-discharge care    [facility] .   Service:  Skilled Nursing Contact information: [address] [place] [address] [phone]                 Allergies  Allergen Reactions  . Latex Itching and Rash    Consultations:  None   Procedures/Studies: Dg Chest 1 View  Result Date: 07/26/2018 CLINICAL DATA:  Hypertension and shortness of Breath EXAM: CHEST  1 VIEW COMPARISON:  04/14/2018 FINDINGS: Cardiac shadow remains prominent. Right basilar atelectasis/infiltrate is noted. No sizable effusion is noted. The left lung remains clear. Chronic changes in the proximal left humerus are noted. No acute bony abnormality is seen. IMPRESSION: Right basilar infiltrate/atelectasis. Electronically Signed   By: Inez Catalina M.D.   On: 07/26/2018 09:12  Dg Chest 2 View  Result Date: 08/16/2018 CLINICAL DATA:  Acute cough and shortness of breath. EXAM: CHEST - 2 VIEW COMPARISON:  08/10/2018 and prior radiographs FINDINGS: Cardiomegaly again noted. RIGHT pleural effusion and RIGHT basilar atelectasis again noted with slightly improved RIGHT basilar aeration. There is no evidence of pneumothorax. No acute bony abnormalities identified. IMPRESSION: Slightly improved RIGHT basilar aeration since 08/10/2018 with continued RIGHT pleural effusion and RIGHT basilar atelectasis. Cardiomegaly Electronically Signed   By: Margarette Canada M.D.   On: 08/16/2018 18:51   Dg Chest 2 View  Result Date: 08/09/2018 CLINICAL DATA:  Persistent chest pressure, shortness of breath, and productive cough. EXAM: CHEST - 2 VIEW COMPARISON:  Chest x-ray dated July 26, 2018. FINDINGS: Stable cardiomediastinal silhouette. Normal pulmonary vascularity. Layering small right pleural effusion has decreased in size. Improved aeration of the right lower lobe. The left  lung is clear. No pneumothorax. No acute osseous abnormality. IMPRESSION: 1. Decreased small right pleural effusion with improving but persistent right lower lobe atelectasis versus infiltrate. Electronically Signed   By: Titus Dubin M.D.   On: 08/09/2018 12:22   Dg Tibia/fibula Left  Result Date: 07/26/2018 [facility] report Dictated by Dr. Aline Brochure Chief complaint proximal tibia fracture X-ray AP lateral left tib-fib proximal tibia fracture on lateral x-ray shows no displacement fracture alignment is maintained sclerotic bone is seen indicating healing of the fracture there is also a fibular fracture. Axial alignment on the AP x-ray looks good as well with fracture callus noted and fracture line resolving multiple surgical clips are noted from prior procedures probably vascular Impression healed left proximal tib-fib fracture with normal alignment   Dg Ankle Complete Right  Result Date: 07/26/2018 [facility] report Dictated by Dr. Aline Brochure Chief complaint  FRACTURE right ankle Bimalleolar fracture right ankle. X-ray shows a Weber B type fibular fracture with characteristic oblique fracture line starting at the margin of the joint nondisplaced medial malleolus fracture. Lateral x-ray shows normal alignment of both fractures Vascular clips are noted calcified tibial vessels are noted. Impression healed bimalleolar fracture right ankle   Ct Angio Chest Pe W/cm &/or Wo Cm  Result Date: 08/10/2018 CLINICAL DATA:  Shortness of breath EXAM: CT ANGIOGRAPHY CHEST WITH CONTRAST TECHNIQUE: Multidetector CT imaging of the chest was performed using the standard protocol during bolus administration of intravenous contrast. Multiplanar CT image reconstructions and MIPs were obtained to evaluate the vascular anatomy. CONTRAST:  156m ISOVUE-370 IOPAMIDOL (ISOVUE-370) INJECTION 76% COMPARISON:  Chest CT July 28, 2013; chest radiograph August 10, 2018 FINDINGS:  Cardiovascular: There is no demonstrable pulmonary embolus. There is no thoracic aortic aneurysm or dissection. The visualized great vessels appear unremarkable. There is aortic atherosclerosis. There are foci of coronary artery calcification. There is no pericardial effusion or pericardial thickening. Mediastinum/Nodes: There is a mass arising in the right lobe of the thyroid measuring 2.0 x 0.9 cm. There are subcentimeter mediastinal lymph nodes. There is no adenopathy by size criteria in the thoracic region. No esophageal lesions are evident. Lungs/Pleura: There is a sizable free-flowing pleural effusion on the right. There is consolidation throughout much of the right lower lobe. There is atelectatic change in a portion of the posterior segment right upper lobe. There is atelectatic change in the left base. There is mild lower lobe bronchiectatic change on the left. Upper Abdomen: Gallbladder is absent. There is abdominal aortic and splenic artery atherosclerosis. Visualized upper abdominal structures otherwise appear unremarkable. Musculoskeletal: There is degenerative change in the thoracic spine.  There is anterior wedging of the T6 vertebral body, also present on previous study. There are no blastic or lytic bone lesions. No evident chest wall lesions. Review of the MIP images confirms the above findings. IMPRESSION: 1. No demonstrable pulmonary embolus. No thoracic aortic aneurysm or dissection. There is aortic atherosclerosis. There are foci of coronary artery calcification. 2. Sizable free-flowing right pleural effusion with consolidation throughout much of the right lower lobe. There is atelectatic change in the posterior segment of the right upper lobe. 3. Atelectatic change left base. Mild bronchiectasis left lower lobe. 4. **An incidental finding of potential clinical significance has been found. 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 Consider further evaluation with thyroid  ultrasound nonemergently. If patient is clinically hyperthyroid, consider nuclear medicine thyroid uptake and scan.** 5.  No evident thoracic adenopathy. 6.  Gallbladder absent. Aortic Atherosclerosis (ICD10-I70.0). Electronically Signed   By: Lowella Grip III M.D.   On: 08/10/2018 11:18   Dg Chest Port 1 View  Result Date: 08/10/2018 CLINICAL DATA:  Shortness of breath, palpitations EXAM: PORTABLE CHEST 1 VIEW COMPARISON:  08/09/2018 FINDINGS: Cardiomegaly with vascular congestion. Right lower lobe airspace opacity with probable layering right effusion. No confluent opacity on the left. No acute bony abnormality. IMPRESSION: Cardiomegaly with vascular congestion. Layering right effusion with right lower lobe atelectasis or infiltrate, unchanged. Electronically Signed   By: Rolm Baptise M.D.   On: 08/10/2018 08:17   Dg Esophagus Inc Scout Chest & Delayed Img Single Cm (ba Or Sol)  Result Date: 08/23/2018 CLINICAL DATA:  68 year old female with history of dysphagia. EXAM: ESOPHOGRAM/BARIUM SWALLOW TECHNIQUE: Single contrast examination was performed using  thin barium. FLUOROSCOPY TIME:  Fluoroscopy Time:  1 minutes Radiation Exposure Index (if provided by the fluoroscopic device): 6.6 mGy COMPARISON:  None. FINDINGS: Examination was severely limited by lack of patient mobility. Patient was imaged in a semi upright supine position with thin barium. Repeated swallows demonstrated intermittent failure to fully propagate a normal primary peristaltic wave, with numerous tertiary contractions. No definite esophageal mass, stricture or esophageal ring. No definite hiatal hernia. A barium tablet was administered, but the patient was unable to swallow the tablet. IMPRESSION: 1. Limited examination, as above. Today's study demonstrates nonspecific esophageal motility disorder with intermittent tertiary contractions. Electronically Signed   By: Vinnie Langton M.D.   On: 08/23/2018 15:48     Discharge  Exam: Vitals:   08/24/18 0554 08/24/18 0917  BP: (!) 121/55   Pulse: 61   Resp: 15   Temp: 97.8 F (36.6 C)   SpO2: 95% 96%   Vitals:   08/23/18 2204 08/24/18 0554 08/24/18 0600 08/24/18 0917  BP: (!) 134/55 (!) 121/55    Pulse: 62 61    Resp: 19 15    Temp: 98.2 F (36.8 C) 97.8 F (36.6 C)    TempSrc: Oral Oral    SpO2: 95% 95%  96%  Weight:   102.1 kg   Height:        General: Pt is alert, awake, not in acute distress Cardiovascular: RRR, S1/S2 +, no rubs, no gallops Respiratory: CTA bilaterally, no wheezing, no rhonchi Abdominal: Soft, NT, ND, bowel sounds + Extremities: no edema, no cyanosis    The results of significant diagnostics from this hospitalization (including imaging, microbiology, ancillary and laboratory) are listed below for reference.     Microbiology: No results found for this or any previous visit (from the past 240 hour(s)).   Labs: BNP (last 3  results) Recent Labs    07/21/18 1109 08/10/18 0746 08/16/18 1848  BNP 751.0* 247.0* 182.9*   Basic Metabolic Panel: Recent Labs  Lab 08/18/18 0414 08/19/18 0515 08/23/18 0533  NA 139 137 139  K 3.6 3.7 3.4*  CL 104 100 98  CO2 27 28 34*  GLUCOSE 157* 309* 96  BUN 19 19 17   CREATININE 0.72 0.81 0.84  CALCIUM 8.3* 8.1* 7.9*   Liver Function Tests: No results for input(s): AST, ALT, ALKPHOS, BILITOT, PROT, ALBUMIN in the last 168 hours. No results for input(s): LIPASE, AMYLASE in the last 168 hours. Recent Labs  Lab 08/19/18 1026  AMMONIA <9*   CBC: Recent Labs  Lab 08/18/18 0414 08/23/18 0533  WBC 9.1 10.2  HGB 9.2* 10.2*  HCT 31.7* 34.4*  MCV 90.1 90.8  PLT 238 312   Cardiac Enzymes: No results for input(s): CKTOTAL, CKMB, CKMBINDEX, TROPONINI in the last 168 hours. BNP: Invalid input(s): POCBNP CBG: Recent Labs  Lab 08/23/18 1624 08/23/18 1645 08/23/18 1711 08/23/18 2206 08/24/18 0728  GLUCAP 60* 66* 84 284* 166*   D-Dimer No results for input(s): DDIMER in  the last 72 hours. Hgb A1c No results for input(s): HGBA1C in the last 72 hours. Lipid Profile No results for input(s): CHOL, HDL, LDLCALC, TRIG, CHOLHDL, LDLDIRECT in the last 72 hours. Thyroid function studies No results for input(s): TSH, T4TOTAL, T3FREE, THYROIDAB in the last 72 hours.  Invalid input(s): FREET3 Anemia work up No results for input(s): VITAMINB12, FOLATE, FERRITIN, TIBC, IRON, RETICCTPCT in the last 72 hours. Urinalysis    Component Value Date/Time   COLORURINE YELLOW 08/16/2018 2100   APPEARANCEUR CLEAR 08/16/2018 2100   LABSPEC 1.024 08/16/2018 2100   PHURINE 6.0 08/16/2018 2100   GLUCOSEU >=500 (A) 08/16/2018 2100   HGBUR MODERATE (A) 08/16/2018 2100   [place] NEGATIVE 08/16/2018 2100   KETONESUR 5 (A) 08/16/2018 2100   PROTEINUR NEGATIVE 08/16/2018 2100   UROBILINOGEN 0.2 12/01/2007 1025   NITRITE NEGATIVE 08/16/2018 2100   LEUKOCYTESUR TRACE (A) 08/16/2018 2100   Sepsis Labs Invalid input(s): PROCALCITONIN,  WBC,  LACTICIDVEN Microbiology No results found for this or any previous visit (from the past 240 hour(s)).   Time coordinating discharge: 35 minutes  SIGNED:   Rodena Goldmann, DO [facility] 08/24/2018, 9:58 AM Pager [phone]  If 7PM-7AM, please contact night-coverage [email or web address] Password [email or web address]

## 2018-08-25 DIAGNOSIS — E860 Dehydration: Secondary | ICD-10-CM | POA: Diagnosis not present

## 2018-08-25 DIAGNOSIS — J441 Chronic obstructive pulmonary disease with (acute) exacerbation: Secondary | ICD-10-CM | POA: Diagnosis not present

## 2018-08-25 DIAGNOSIS — E119 Type 2 diabetes mellitus without complications: Secondary | ICD-10-CM | POA: Diagnosis not present

## 2018-08-25 DIAGNOSIS — I10 Essential (primary) hypertension: Secondary | ICD-10-CM | POA: Diagnosis not present

## 2018-08-27 DIAGNOSIS — I4891 Unspecified atrial fibrillation: Secondary | ICD-10-CM | POA: Diagnosis not present

## 2018-08-27 DIAGNOSIS — I5032 Chronic diastolic (congestive) heart failure: Secondary | ICD-10-CM | POA: Diagnosis not present

## 2018-08-27 DIAGNOSIS — E119 Type 2 diabetes mellitus without complications: Secondary | ICD-10-CM | POA: Diagnosis not present

## 2018-08-27 DIAGNOSIS — S82401D Unspecified fracture of shaft of right fibula, subsequent encounter for closed fracture with routine healing: Secondary | ICD-10-CM | POA: Diagnosis not present

## 2018-09-01 DIAGNOSIS — J441 Chronic obstructive pulmonary disease with (acute) exacerbation: Secondary | ICD-10-CM | POA: Diagnosis not present

## 2018-09-02 ENCOUNTER — Inpatient Hospital Stay (HOSPITAL_COMMUNITY)
Admission: EM | Admit: 2018-09-02 | Discharge: 2018-09-13 | DRG: 392 | Disposition: A | Discharge status: Home / Self Care | Payer: BLUE CROSS/BLUE SHIELD | Attending: Internal Medicine | Admitting: Internal Medicine

## 2018-09-02 ENCOUNTER — Other Ambulatory Visit: Payer: Self-pay

## 2018-09-02 DIAGNOSIS — F339 Major depressive disorder, recurrent, unspecified: Secondary | ICD-10-CM | POA: Diagnosis not present

## 2018-09-02 DIAGNOSIS — K224 Dyskinesia of esophagus: Secondary | ICD-10-CM | POA: Diagnosis not present

## 2018-09-02 DIAGNOSIS — F329 Major depressive disorder, single episode, unspecified: Secondary | ICD-10-CM | POA: Diagnosis present

## 2018-09-02 DIAGNOSIS — Z6841 Body Mass Index (BMI) 40.0 and over, adult: Secondary | ICD-10-CM

## 2018-09-02 DIAGNOSIS — K5710 Diverticulosis of small intestine without perforation or abscess without bleeding: Secondary | ICD-10-CM | POA: Diagnosis not present

## 2018-09-02 DIAGNOSIS — K2960 Other gastritis without bleeding: Secondary | ICD-10-CM

## 2018-09-02 DIAGNOSIS — E1122 Type 2 diabetes mellitus with diabetic chronic kidney disease: Secondary | ICD-10-CM | POA: Diagnosis not present

## 2018-09-02 DIAGNOSIS — K2971 Gastritis, unspecified, with bleeding: Secondary | ICD-10-CM | POA: Diagnosis not present

## 2018-09-02 DIAGNOSIS — E042 Nontoxic multinodular goiter: Secondary | ICD-10-CM | POA: Diagnosis not present

## 2018-09-02 DIAGNOSIS — Z7901 Long term (current) use of anticoagulants: Secondary | ICD-10-CM

## 2018-09-02 DIAGNOSIS — K625 Hemorrhage of anus and rectum: Secondary | ICD-10-CM

## 2018-09-02 DIAGNOSIS — J9610 Chronic respiratory failure, unspecified whether with hypoxia or hypercapnia: Secondary | ICD-10-CM | POA: Diagnosis present

## 2018-09-02 DIAGNOSIS — R935 Abnormal findings on diagnostic imaging of other abdominal regions, including retroperitoneum: Secondary | ICD-10-CM | POA: Diagnosis not present

## 2018-09-02 DIAGNOSIS — A09 Infectious gastroenteritis and colitis, unspecified: Secondary | ICD-10-CM | POA: Diagnosis not present

## 2018-09-02 DIAGNOSIS — I5032 Chronic diastolic (congestive) heart failure: Secondary | ICD-10-CM | POA: Diagnosis not present

## 2018-09-02 DIAGNOSIS — K922 Gastrointestinal hemorrhage, unspecified: Secondary | ICD-10-CM | POA: Diagnosis not present

## 2018-09-02 DIAGNOSIS — D500 Iron deficiency anemia secondary to blood loss (chronic): Secondary | ICD-10-CM | POA: Diagnosis present

## 2018-09-02 DIAGNOSIS — IMO0002 Reserved for concepts with insufficient information to code with codable children: Secondary | ICD-10-CM | POA: Diagnosis present

## 2018-09-02 DIAGNOSIS — R531 Weakness: Secondary | ICD-10-CM | POA: Diagnosis not present

## 2018-09-02 DIAGNOSIS — I9589 Other hypotension: Secondary | ICD-10-CM | POA: Diagnosis not present

## 2018-09-02 DIAGNOSIS — J441 Chronic obstructive pulmonary disease with (acute) exacerbation: Secondary | ICD-10-CM | POA: Diagnosis not present

## 2018-09-02 DIAGNOSIS — N183 Chronic kidney disease, stage 3 unspecified: Secondary | ICD-10-CM | POA: Diagnosis present

## 2018-09-02 DIAGNOSIS — R109 Unspecified abdominal pain: Secondary | ICD-10-CM | POA: Diagnosis not present

## 2018-09-02 DIAGNOSIS — E860 Dehydration: Secondary | ICD-10-CM | POA: Diagnosis not present

## 2018-09-02 DIAGNOSIS — E876 Hypokalemia: Secondary | ICD-10-CM | POA: Diagnosis present

## 2018-09-02 DIAGNOSIS — K2970 Gastritis, unspecified, without bleeding: Secondary | ICD-10-CM | POA: Diagnosis not present

## 2018-09-02 DIAGNOSIS — I959 Hypotension, unspecified: Secondary | ICD-10-CM | POA: Diagnosis not present

## 2018-09-02 DIAGNOSIS — M810 Age-related osteoporosis without current pathological fracture: Secondary | ICD-10-CM | POA: Diagnosis present

## 2018-09-02 DIAGNOSIS — J449 Chronic obstructive pulmonary disease, unspecified: Secondary | ICD-10-CM | POA: Diagnosis present

## 2018-09-02 DIAGNOSIS — K5900 Constipation, unspecified: Secondary | ICD-10-CM | POA: Diagnosis present

## 2018-09-02 DIAGNOSIS — R197 Diarrhea, unspecified: Secondary | ICD-10-CM | POA: Diagnosis not present

## 2018-09-02 DIAGNOSIS — Z7401 Bed confinement status: Secondary | ICD-10-CM | POA: Diagnosis not present

## 2018-09-02 DIAGNOSIS — E1165 Type 2 diabetes mellitus with hyperglycemia: Secondary | ICD-10-CM | POA: Diagnosis not present

## 2018-09-02 DIAGNOSIS — I4891 Unspecified atrial fibrillation: Secondary | ICD-10-CM | POA: Diagnosis not present

## 2018-09-02 DIAGNOSIS — I130 Hypertensive heart and chronic kidney disease with heart failure and stage 1 through stage 4 chronic kidney disease, or unspecified chronic kidney disease: Secondary | ICD-10-CM | POA: Diagnosis present

## 2018-09-02 DIAGNOSIS — R279 Unspecified lack of coordination: Secondary | ICD-10-CM | POA: Diagnosis not present

## 2018-09-02 DIAGNOSIS — I482 Chronic atrial fibrillation, unspecified: Secondary | ICD-10-CM | POA: Diagnosis not present

## 2018-09-02 DIAGNOSIS — K5641 Fecal impaction: Secondary | ICD-10-CM | POA: Diagnosis not present

## 2018-09-02 DIAGNOSIS — Z743 Need for continuous supervision: Secondary | ICD-10-CM | POA: Diagnosis not present

## 2018-09-02 DIAGNOSIS — F32A Depression, unspecified: Secondary | ICD-10-CM | POA: Diagnosis present

## 2018-09-02 DIAGNOSIS — R1084 Generalized abdominal pain: Secondary | ICD-10-CM | POA: Diagnosis not present

## 2018-09-02 DIAGNOSIS — S7292XA Unspecified fracture of left femur, initial encounter for closed fracture: Secondary | ICD-10-CM | POA: Diagnosis not present

## 2018-09-02 DIAGNOSIS — N3000 Acute cystitis without hematuria: Secondary | ICD-10-CM | POA: Diagnosis not present

## 2018-09-02 DIAGNOSIS — Z7984 Long term (current) use of oral hypoglycemic drugs: Secondary | ICD-10-CM

## 2018-09-02 DIAGNOSIS — N83201 Unspecified ovarian cyst, right side: Secondary | ICD-10-CM

## 2018-09-02 DIAGNOSIS — R1314 Dysphagia, pharyngoesophageal phase: Secondary | ICD-10-CM | POA: Diagnosis present

## 2018-09-02 DIAGNOSIS — T39395A Adverse effect of other nonsteroidal anti-inflammatory drugs [NSAID], initial encounter: Secondary | ICD-10-CM | POA: Diagnosis present

## 2018-09-02 DIAGNOSIS — R1030 Lower abdominal pain, unspecified: Secondary | ICD-10-CM | POA: Diagnosis not present

## 2018-09-02 DIAGNOSIS — R52 Pain, unspecified: Secondary | ICD-10-CM | POA: Diagnosis not present

## 2018-09-02 DIAGNOSIS — E861 Hypovolemia: Secondary | ICD-10-CM | POA: Diagnosis not present

## 2018-09-02 DIAGNOSIS — K529 Noninfective gastroenteritis and colitis, unspecified: Secondary | ICD-10-CM

## 2018-09-02 DIAGNOSIS — E11649 Type 2 diabetes mellitus with hypoglycemia without coma: Secondary | ICD-10-CM | POA: Diagnosis present

## 2018-09-02 DIAGNOSIS — J90 Pleural effusion, not elsewhere classified: Secondary | ICD-10-CM | POA: Diagnosis not present

## 2018-09-02 DIAGNOSIS — R1110 Vomiting, unspecified: Secondary | ICD-10-CM | POA: Diagnosis not present

## 2018-09-02 DIAGNOSIS — D689 Coagulation defect, unspecified: Secondary | ICD-10-CM | POA: Diagnosis not present

## 2018-09-02 DIAGNOSIS — Z91040 Latex allergy status: Secondary | ICD-10-CM

## 2018-09-02 DIAGNOSIS — I513 Intracardiac thrombosis, not elsewhere classified: Secondary | ICD-10-CM | POA: Diagnosis present

## 2018-09-02 DIAGNOSIS — Z79899 Other long term (current) drug therapy: Secondary | ICD-10-CM

## 2018-09-02 DIAGNOSIS — Z86718 Personal history of other venous thrombosis and embolism: Secondary | ICD-10-CM | POA: Diagnosis not present

## 2018-09-02 DIAGNOSIS — R5381 Other malaise: Secondary | ICD-10-CM | POA: Diagnosis not present

## 2018-09-02 DIAGNOSIS — E041 Nontoxic single thyroid nodule: Secondary | ICD-10-CM | POA: Diagnosis not present

## 2018-09-02 DIAGNOSIS — R1310 Dysphagia, unspecified: Secondary | ICD-10-CM

## 2018-09-02 DIAGNOSIS — R1319 Other dysphagia: Secondary | ICD-10-CM

## 2018-09-02 DIAGNOSIS — K222 Esophageal obstruction: Secondary | ICD-10-CM | POA: Diagnosis not present

## 2018-09-02 DIAGNOSIS — N390 Urinary tract infection, site not specified: Secondary | ICD-10-CM | POA: Diagnosis not present

## 2018-09-02 DIAGNOSIS — I10 Essential (primary) hypertension: Secondary | ICD-10-CM | POA: Diagnosis not present

## 2018-09-02 DIAGNOSIS — E669 Obesity, unspecified: Secondary | ICD-10-CM | POA: Diagnosis not present

## 2018-09-02 DIAGNOSIS — R791 Abnormal coagulation profile: Secondary | ICD-10-CM | POA: Diagnosis present

## 2018-09-02 DIAGNOSIS — K862 Cyst of pancreas: Secondary | ICD-10-CM | POA: Diagnosis not present

## 2018-09-02 DIAGNOSIS — K869 Disease of pancreas, unspecified: Secondary | ICD-10-CM | POA: Diagnosis not present

## 2018-09-02 MED ORDER — SODIUM CHLORIDE 0.9 % IV BOLUS
1000.0000 mL | Freq: Once | INTRAVENOUS | Status: AC
  Administered 2018-09-02: 1000 mL via INTRAVENOUS

## 2018-09-02 MED ORDER — EMPTY CONTAINERS FLEXIBLE MISC
900.0000 mg | Freq: Once | Status: AC
  Administered 2018-09-03: 900 mg via INTRAVENOUS
  Filled 2018-09-02: qty 90

## 2018-09-02 NOTE — ED Triage Notes (Signed)
Pt is resident of [facility], brought in by [facility] for gi bleed tonight.  Pt was seen at [facility] this am for dehydration and sent back to the facility.  Pt c/o abd pain

## 2018-09-03 ENCOUNTER — Emergency Department (HOSPITAL_COMMUNITY): Payer: BLUE CROSS/BLUE SHIELD

## 2018-09-03 ENCOUNTER — Encounter (HOSPITAL_COMMUNITY): Payer: Self-pay | Admitting: Internal Medicine

## 2018-09-03 DIAGNOSIS — R197 Diarrhea, unspecified: Secondary | ICD-10-CM | POA: Diagnosis not present

## 2018-09-03 DIAGNOSIS — K625 Hemorrhage of anus and rectum: Secondary | ICD-10-CM

## 2018-09-03 DIAGNOSIS — D689 Coagulation defect, unspecified: Secondary | ICD-10-CM

## 2018-09-03 DIAGNOSIS — K922 Gastrointestinal hemorrhage, unspecified: Secondary | ICD-10-CM | POA: Diagnosis not present

## 2018-09-03 DIAGNOSIS — N183 Chronic kidney disease, stage 3 (moderate): Secondary | ICD-10-CM | POA: Diagnosis not present

## 2018-09-03 DIAGNOSIS — I5032 Chronic diastolic (congestive) heart failure: Secondary | ICD-10-CM | POA: Diagnosis not present

## 2018-09-03 DIAGNOSIS — R1030 Lower abdominal pain, unspecified: Secondary | ICD-10-CM

## 2018-09-03 DIAGNOSIS — K529 Noninfective gastroenteritis and colitis, unspecified: Secondary | ICD-10-CM

## 2018-09-03 LAB — HEMOGLOBIN AND HEMATOCRIT, BLOOD
HCT: 31.4 % — ABNORMAL LOW (ref 36.0–46.0)
HCT: 30.8 % — ABNORMAL LOW (ref 36.0–46.0)
HEMOGLOBIN: 8.8 g/dL — AB (ref 12.0–15.0)
Hemoglobin: 9 g/dL — ABNORMAL LOW (ref 12.0–15.0)

## 2018-09-03 LAB — CBC WITH DIFFERENTIAL/PLATELET
Abs Immature Granulocytes: 0.02 10*3/uL (ref 0.00–0.07)
Abs Immature Granulocytes: 0.03 10*3/uL (ref 0.00–0.07)
Basophils Absolute: 0 10*3/uL (ref 0.0–0.1)
Basophils Absolute: 0 10*3/uL (ref 0.0–0.1)
Basophils Relative: 0 %
Basophils Relative: 0 %
EOS ABS: 0.1 10*3/uL (ref 0.0–0.5)
Eosinophils Absolute: 0.7 10*3/uL — ABNORMAL HIGH (ref 0.0–0.5)
Eosinophils Relative: 1 %
Eosinophils Relative: 6 %
HCT: 34.5 % — ABNORMAL LOW (ref 36.0–46.0)
HCT: 35.2 % — ABNORMAL LOW (ref 36.0–46.0)
Hemoglobin: 10 g/dL — ABNORMAL LOW (ref 12.0–15.0)
Hemoglobin: 10.1 g/dL — ABNORMAL LOW (ref 12.0–15.0)
IMMATURE GRANULOCYTES: 0 %
Immature Granulocytes: 0 %
LYMPHS ABS: 1.3 10*3/uL (ref 0.7–4.0)
Lymphocytes Relative: 12 %
Lymphocytes Relative: 8 %
Lymphs Abs: 0.9 10*3/uL (ref 0.7–4.0)
MCH: 26.5 pg (ref 26.0–34.0)
MCH: 27 pg (ref 26.0–34.0)
MCHC: 28.7 g/dL — ABNORMAL LOW (ref 30.0–36.0)
MCHC: 29 g/dL — ABNORMAL LOW (ref 30.0–36.0)
MCV: 93.2 fL (ref 80.0–100.0)
MCV: 92.4 fL (ref 80.0–100.0)
Monocytes Absolute: 0.8 10*3/uL (ref 0.1–1.0)
Monocytes Absolute: 1 10*3/uL (ref 0.1–1.0)
Monocytes Relative: 10 %
Monocytes Relative: 8 %
NEUTROS PCT: 72 %
Neutro Abs: 7.6 10*3/uL (ref 1.7–7.7)
Neutro Abs: 9.4 10*3/uL — ABNORMAL HIGH (ref 1.7–7.7)
Neutrophils Relative %: 83 %
Platelets: 313 10*3/uL (ref 150–400)
Platelets: 326 10*3/uL (ref 150–400)
RBC: 3.7 MIL/uL — ABNORMAL LOW (ref 3.87–5.11)
RBC: 3.81 MIL/uL — ABNORMAL LOW (ref 3.87–5.11)
RDW: 15.9 % — ABNORMAL HIGH (ref 11.5–15.5)
RDW: 15.9 % — AB (ref 11.5–15.5)
WBC: 10.7 10*3/uL — ABNORMAL HIGH (ref 4.0–10.5)
WBC: 11.2 10*3/uL — ABNORMAL HIGH (ref 4.0–10.5)
nRBC: 0 % (ref 0.0–0.2)
nRBC: 0 % (ref 0.0–0.2)

## 2018-09-03 LAB — BASIC METABOLIC PANEL
Anion gap: 12 (ref 5–15)
BUN: 34 mg/dL — ABNORMAL HIGH (ref 8–23)
CO2: 26 mmol/L (ref 22–32)
Calcium: 7.6 mg/dL — ABNORMAL LOW (ref 8.9–10.3)
Chloride: 100 mmol/L (ref 98–111)
Creatinine, Ser: 1.08 mg/dL — ABNORMAL HIGH (ref 0.44–1.00)
GFR calc Af Amer: >60 mL/min (ref >60)
GFR calc non Af Amer: 53 mL/min — ABNORMAL LOW (ref >60)
Glucose, Bld: 91 mg/dL (ref 70–99)
POTASSIUM: 3.6 mmol/L (ref 3.5–5.1)
Sodium: 138 mmol/L (ref 135–145)

## 2018-09-03 LAB — HEPARIN LEVEL (UNFRACTIONATED): Heparin Unfractionated: >2.2 IU/mL — ABNORMAL HIGH (ref 0.30–0.70)

## 2018-09-03 LAB — GLUCOSE, CAPILLARY
Glucose-Capillary: 66 mg/dL — ABNORMAL LOW (ref 70–99)
Glucose-Capillary: 87 mg/dL (ref 70–99)
Glucose-Capillary: 41 mg/dL — CL (ref 70–99)
Glucose-Capillary: 61 mg/dL — ABNORMAL LOW (ref 70–99)
Glucose-Capillary: 76 mg/dL (ref 70–99)
Glucose-Capillary: 84 mg/dL (ref 70–99)
Glucose-Capillary: 129 mg/dL — ABNORMAL HIGH (ref 70–99)

## 2018-09-03 LAB — CBC
HCT: 30.9 % — ABNORMAL LOW (ref 36.0–46.0)
Hemoglobin: 9 g/dL — ABNORMAL LOW (ref 12.0–15.0)
MCH: 27.1 pg (ref 26.0–34.0)
MCHC: 29.1 g/dL — ABNORMAL LOW (ref 30.0–36.0)
MCV: 93.1 fL (ref 80.0–100.0)
Platelets: 313 10*3/uL (ref 150–400)
RBC: 3.32 MIL/uL — ABNORMAL LOW (ref 3.87–5.11)
RDW: 15.8 % — ABNORMAL HIGH (ref 11.5–15.5)
WBC: 8.2 10*3/uL (ref 4.0–10.5)
nRBC: 0 % (ref 0.0–0.2)

## 2018-09-03 LAB — PROTIME-INR
INR: 1.94
Prothrombin Time: 21.9 seconds — ABNORMAL HIGH (ref 11.4–15.2)

## 2018-09-03 LAB — COMPREHENSIVE METABOLIC PANEL
ALT: 12 U/L (ref 0–44)
AST: 19 U/L (ref 15–41)
Albumin: 2.7 g/dL — ABNORMAL LOW (ref 3.5–5.0)
Alkaline Phosphatase: 81 U/L (ref 38–126)
Anion gap: 10 (ref 5–15)
BUN: 37 mg/dL — ABNORMAL HIGH (ref 8–23)
CO2: 28 mmol/L (ref 22–32)
Calcium: 7.8 mg/dL — ABNORMAL LOW (ref 8.9–10.3)
Chloride: 97 mmol/L — ABNORMAL LOW (ref 98–111)
Creatinine, Ser: 1.07 mg/dL — ABNORMAL HIGH (ref 0.44–1.00)
GFR calc Af Amer: >60 mL/min (ref >60)
GFR calc non Af Amer: 54 mL/min — ABNORMAL LOW (ref >60)
Glucose, Bld: 237 mg/dL — ABNORMAL HIGH (ref 70–99)
Potassium: 3.4 mmol/L — ABNORMAL LOW (ref 3.5–5.1)
Sodium: 135 mmol/L (ref 135–145)
Total Bilirubin: 0.9 mg/dL (ref 0.3–1.2)
Total Protein: 6.1 g/dL — ABNORMAL LOW (ref 6.5–8.1)

## 2018-09-03 LAB — ABO/RH: ABO/RH(D): A POS

## 2018-09-03 LAB — APTT: aPTT: 39 seconds — ABNORMAL HIGH (ref 24–36)

## 2018-09-03 LAB — PREPARE RBC (CROSSMATCH)

## 2018-09-03 LAB — POC OCCULT BLOOD, ED: Fecal Occult Bld: POSITIVE — AB

## 2018-09-03 MED ORDER — ACETAMINOPHEN 325 MG PO TABS
650.0000 mg | ORAL_TABLET | Freq: Four times a day (QID) | ORAL | Status: DC | PRN
  Administered 2018-09-05 – 2018-09-07 (×2): 650 mg via ORAL
  Filled 2018-09-03 (×2): qty 2

## 2018-09-03 MED ORDER — PANTOPRAZOLE SODIUM 40 MG IV SOLR: 40.0000 mg | INTRAVENOUS | Status: DC

## 2018-09-03 MED ORDER — SODIUM CHLORIDE 0.9 % IV SOLN
2.0000 g | INTRAVENOUS | Status: DC
  Administered 2018-09-04 – 2018-09-13 (×10): 2 g via INTRAVENOUS
  Filled 2018-09-03: qty 20
  Filled 2018-09-03 (×6): qty 2
  Filled 2018-09-03: qty 20
  Filled 2018-09-03 (×4): qty 2

## 2018-09-03 MED ORDER — DEXTROSE 50 % IV SOLN
25.0000 g | INTRAVENOUS | Status: AC
  Administered 2018-09-03: 25 g via INTRAVENOUS

## 2018-09-03 MED ORDER — POTASSIUM CHLORIDE 2 MEQ/ML IV SOLN: INTRAVENOUS | Status: DC

## 2018-09-03 MED ORDER — DEXTROSE 50 % IV SOLN: 12.5000 g | INTRAVENOUS | Status: AC

## 2018-09-03 MED ORDER — ONDANSETRON HCL 4 MG PO TABS
4.0000 mg | ORAL_TABLET | Freq: Four times a day (QID) | ORAL | Status: DC | PRN
  Administered 2018-09-07: 4 mg via ORAL
  Filled 2018-09-03: qty 1

## 2018-09-03 MED ORDER — ORAL CARE MOUTH RINSE
15.0000 mL | Freq: Two times a day (BID) | OROMUCOSAL | Status: DC
  Administered 2018-09-03 – 2018-09-13 (×13): 15 mL via OROMUCOSAL

## 2018-09-03 MED ORDER — SODIUM CHLORIDE 0.9 % IV BOLUS
1000.0000 mL | Freq: Once | INTRAVENOUS | Status: AC
  Administered 2018-09-03: 1000 mL via INTRAVENOUS

## 2018-09-03 MED ORDER — LEVALBUTEROL HCL 0.63 MG/3ML IN NEBU
0.6300 mg | INHALATION_SOLUTION | Freq: Four times a day (QID) | RESPIRATORY_TRACT | Status: DC | PRN
  Administered 2018-09-11: 0.63 mg via RESPIRATORY_TRACT
  Filled 2018-09-03: qty 3

## 2018-09-03 MED ORDER — CHLORHEXIDINE GLUCONATE 0.12 % MT SOLN
15.0000 mL | Freq: Two times a day (BID) | OROMUCOSAL | Status: DC
  Administered 2018-09-04 – 2018-09-13 (×18): 15 mL via OROMUCOSAL
  Filled 2018-09-03 (×18): qty 15

## 2018-09-03 MED ORDER — FENTANYL CITRATE (PF) 100 MCG/2ML IJ SOLN
INTRAMUSCULAR | Status: AC
  Administered 2018-09-03: 12.5 ug via INTRAVENOUS
  Filled 2018-09-03: qty 2

## 2018-09-03 MED ORDER — ONDANSETRON HCL 4 MG/2ML IJ SOLN
4.0000 mg | Freq: Four times a day (QID) | INTRAMUSCULAR | Status: DC | PRN
  Administered 2018-09-03 – 2018-09-13 (×7): 4 mg via INTRAVENOUS
  Filled 2018-09-03 (×7): qty 2

## 2018-09-03 MED ORDER — METRONIDAZOLE IN NACL 5-0.79 MG/ML-% IV SOLN
500.0000 mg | Freq: Once | INTRAVENOUS | Status: AC
  Administered 2018-09-03: 500 mg via INTRAVENOUS
  Filled 2018-09-03: qty 100

## 2018-09-03 MED ORDER — PANTOPRAZOLE SODIUM 40 MG IV SOLR
40.0000 mg | INTRAVENOUS | Status: DC
  Administered 2018-09-03 – 2018-09-04 (×2): 40 mg via INTRAVENOUS
  Filled 2018-09-03 (×2): qty 40

## 2018-09-03 MED ORDER — KCL IN DEXTROSE-NACL 40-5-0.9 MEQ/L-%-% IV SOLN
INTRAVENOUS | Status: DC
  Administered 2018-09-03: 14:00:00 via INTRAVENOUS

## 2018-09-03 MED ORDER — SODIUM CHLORIDE 0.9 % IV SOLN: 1.0000 g | INTRAVENOUS | Status: DC

## 2018-09-03 MED ORDER — POTASSIUM CHLORIDE IN NACL 40-0.9 MEQ/L-% IV SOLN
INTRAVENOUS | Status: DC
  Administered 2018-09-03: 88 mL/h via INTRAVENOUS
  Filled 2018-09-03 (×4): qty 1000

## 2018-09-03 MED ORDER — UMECLIDINIUM BROMIDE 62.5 MCG/INH IN AEPB
1.0000 | INHALATION_SPRAY | Freq: Every day | RESPIRATORY_TRACT | Status: DC
  Administered 2018-09-03 – 2018-09-13 (×8): 1 via RESPIRATORY_TRACT
  Filled 2018-09-03 (×2): qty 7

## 2018-09-03 MED ORDER — DEXTROSE 50 % IV SOLN
INTRAVENOUS | Status: AC
  Administered 2018-09-03: 25 mL
  Filled 2018-09-03: qty 50

## 2018-09-03 MED ORDER — SODIUM CHLORIDE 0.9 % IV SOLN
2.0000 g | Freq: Once | INTRAVENOUS | Status: AC
  Administered 2018-09-03: 2 g via INTRAVENOUS
  Filled 2018-09-03: qty 20

## 2018-09-03 MED ORDER — INSULIN ASPART 100 UNIT/ML ~~LOC~~ SOLN
0.0000 [IU] | Freq: Three times a day (TID) | SUBCUTANEOUS | Status: DC
  Administered 2018-09-04: 5 [IU] via SUBCUTANEOUS
  Administered 2018-09-04 – 2018-09-05 (×3): 2 [IU] via SUBCUTANEOUS
  Administered 2018-09-05: 3 [IU] via SUBCUTANEOUS
  Administered 2018-09-05: 1 [IU] via SUBCUTANEOUS
  Administered 2018-09-06: 2 [IU] via SUBCUTANEOUS
  Administered 2018-09-06: 1 [IU] via SUBCUTANEOUS
  Administered 2018-09-06 – 2018-09-07 (×2): 2 [IU] via SUBCUTANEOUS
  Administered 2018-09-07: 3 [IU] via SUBCUTANEOUS
  Administered 2018-09-07: 2 [IU] via SUBCUTANEOUS
  Administered 2018-09-08: 1 [IU] via SUBCUTANEOUS
  Administered 2018-09-08 (×2): 2 [IU] via SUBCUTANEOUS
  Administered 2018-09-09: 1 [IU] via SUBCUTANEOUS
  Administered 2018-09-09: 2 [IU] via SUBCUTANEOUS
  Administered 2018-09-09: 3 [IU] via SUBCUTANEOUS
  Administered 2018-09-10: 2 [IU] via SUBCUTANEOUS
  Administered 2018-09-10: 1 [IU] via SUBCUTANEOUS
  Administered 2018-09-10: 2 [IU] via SUBCUTANEOUS
  Administered 2018-09-11: 3 [IU] via SUBCUTANEOUS
  Administered 2018-09-11 – 2018-09-12 (×3): 2 [IU] via SUBCUTANEOUS
  Administered 2018-09-12: 3 [IU] via SUBCUTANEOUS
  Administered 2018-09-12: 2 [IU] via SUBCUTANEOUS
  Administered 2018-09-13: 1 [IU] via SUBCUTANEOUS
  Administered 2018-09-13: 2 [IU] via SUBCUTANEOUS

## 2018-09-03 MED ORDER — FENTANYL CITRATE (PF) 100 MCG/2ML IJ SOLN
12.5000 ug | INTRAMUSCULAR | Status: DC | PRN
  Administered 2018-09-03 – 2018-09-10 (×2): 12.5 ug via INTRAVENOUS
  Filled 2018-09-03: qty 2

## 2018-09-03 MED ORDER — METRONIDAZOLE IN NACL 5-0.79 MG/ML-% IV SOLN
500.0000 mg | Freq: Three times a day (TID) | INTRAVENOUS | Status: DC
  Administered 2018-09-03 – 2018-09-08 (×16): 500 mg via INTRAVENOUS
  Filled 2018-09-03 (×15): qty 100

## 2018-09-03 MED ORDER — ACETAMINOPHEN 650 MG RE SUPP: 650.0000 mg | Freq: Four times a day (QID) | RECTAL | Status: DC | PRN

## 2018-09-03 MED ORDER — IOPAMIDOL (ISOVUE-300) INJECTION 61%
100.0000 mL | Freq: Once | INTRAVENOUS | Status: AC | PRN
  Administered 2018-09-03: 100 mL via INTRAVENOUS

## 2018-09-03 NOTE — NC FL2 (Signed)
[place] LEVEL OF CARE SCREENING TOOL     IDENTIFICATION  Patient Name: [name] Birthdate: April 18, 1951 Sex: female Admission Date (Current Location): 09/02/2018  [facility] [place] Number:  [employer] and Address:  [place] [address], [place]      Provider Number: [id number]  Attending Physician Name and Address:  Kathie Dike, MD  Relative Name and Phone Number:       Current Level of Care: (obs. ) Recommended Level of Care: [place] Prior Approval Number:    Date Approved/Denied:   PASRR Number: [id number]  Discharge Plan: SNF    Current Diagnoses: Patient Active Problem List   Diagnosis Date Noted  . GI bleed 09/03/2018  . Coagulopathy ([place])   . Colitis   . Lower abdominal pain   . Rectal bleeding   . Dehydration 08/16/2018  . Acute on chronic diastolic CHF (congestive heart failure) ([place]) 08/10/2018  . Acute lower UTI 08/10/2018  . Hypokalemia 08/10/2018  . COPD (chronic obstructive pulmonary disease) ([place]) 08/10/2018  . Thyroid lesion 08/10/2018  . Closed fracture of right ankle 06/10/2018  . Closed fracture of left tibia and fibula with routine healing 04/13/18 06/10/2018  . Unspecified atrial fibrillation ([place]) 04/15/2018  . Chronic diastolic CHF (congestive heart failure) ([place]) 04/15/2018  . CKD (chronic kidney disease), stage III ([place]) 04/15/2018  . Closed right fibular fracture 04/15/2018  . Left tibial fracture 04/14/2018  . CHF exacerbation ([place]) 04/10/2018  . SOB (shortness of breath)   . Acute CHF (congestive heart failure) ([place]) 04/01/2018  . Atrial fibrillation with RVR ([place]) 04/01/2018  . [place] 03/11/2016  . Muscle spasms of lower extremity 03/04/2016  . Acute renal insufficiency 02/26/2016  . History of MRSA infection 02/26/2016  . Diabetes type 2, uncontrolled ([place]) 02/19/2016  . HTN (hypertension) 02/19/2016  . Fracture, femur, distal ([place]) 02/19/2016   . Fall 02/19/2016  . Depression 02/19/2016  . Closed left femoral fracture, initial encounter 02/19/2016  . Femur fracture, left ([place]) 02/19/2016    Orientation RESPIRATION BLADDER Height & Weight     Self, Time, Situation, Place  Normal Continent Weight: 227 lb 15.3 oz (103.4 kg) Height:  5' 4"  (162.6 cm)  BEHAVIORAL SYMPTOMS/MOOD NEUROLOGICAL BOWEL NUTRITION STATUS      Continent Diet(see discharge summary )  AMBULATORY STATUS COMMUNICATION OF NEEDS Skin   Extensive Assist Verbally Normal                       Personal Care Assistance Level of Assistance  Bathing, Feeding, Dressing Bathing Assistance: Limited assistance Feeding assistance: Independent Dressing Assistance: Limited assistance     Functional Limitations Info  Sight, Hearing, Speech Sight Info: Adequate Hearing Info: Adequate Speech Info: Adequate    SPECIAL CARE FACTORS FREQUENCY  PT (By licensed PT), Speech therapy                    Contractures Contractures Info: Not present    Additional Factors Info  Code Status, Allergies, Psychotropic Code Status Info: full code Allergies Info: latex Psychotropic Info: wellbutrin, desyrel         Current Medications (09/03/2018):  This is the current hospital active medication list Current Facility-Administered Medications  Medication Dose Route Frequency Provider Last Rate Last Dose  . acetaminophen (TYLENOL) tablet 650 mg  650 mg Oral Q6H PRN Reubin Milan, MD       Or  . acetaminophen (  TYLENOL) suppository 650 mg  650 mg Rectal Q6H PRN Reubin Milan, MD      . Derrill Memo ON 09/04/2018] cefTRIAXone (ROCEPHIN) 2 g in sodium chloride 0.9 % 100 mL IVPB  2 g Intravenous Q24H Kathie Dike, MD      . chlorhexidine (PERIDEX) 0.12 % solution 15 mL  15 mL Mouth Rinse BID Memon, Jehanzeb, MD      . dextrose 5 % and 0.9 % NaCl with KCl 40 mEq/L infusion   Intravenous Continuous Kathie Dike, MD 75 mL/hr at 09/03/18 1621    . fentaNYL  (SUBLIMAZE) injection 12.5 mcg  12.5 mcg Intravenous Q2H PRN Reubin Milan, MD   12.5 mcg at 09/03/18 6301  . insulin aspart (novoLOG) injection 0-9 Units  0-9 Units Subcutaneous TID WC Reubin Milan, MD      . levalbuterol [facility] nebulizer solution 0.63 mg  0.63 mg Nebulization Q6H PRN Reubin Milan, MD      . MEDLINE mouth rinse  15 mL Mouth Rinse q12n4p Kathie Dike, MD      . metroNIDAZOLE (FLAGYL) IVPB 500 mg  500 mg Intravenous Q8H Reubin Milan, MD   Stopped at 09/03/18 1305  . ondansetron (ZOFRAN) tablet 4 mg  4 mg Oral Q6H PRN Reubin Milan, MD       Or  . ondansetron [facility]) injection 4 mg  4 mg Intravenous Q6H PRN Reubin Milan, MD   4 mg at 09/03/18 1320  . pantoprazole (PROTONIX) injection 40 mg  40 mg Intravenous Q24H Reubin Milan, MD   40 mg at 09/03/18 0345  . umeclidinium bromide (INCRUSE ELLIPTA) 62.5 MCG/INH 1 puff  1 puff Inhalation Daily Reubin Milan, MD         Discharge Medications: Please see discharge summary for a list of discharge medications.  Relevant Imaging Results:  Relevant Lab Results:   Additional Information SSN: [id number]  Ihor Gully, LCSW

## 2018-09-03 NOTE — Clinical Social Work Note (Signed)
Clinical Social Work Assessment  Patient Details  Name: [name] MRN: [id number] Date of Birth: Apr 26, 1951  Date of referral:  09/03/18               Reason for consult:  Facility Placement                Permission sought to share information with:    Permission granted to share information::     Name::        Agency::     Relationship::     Contact Information:     Housing/Transportation Living arrangements for the past 2 months:  Single Family Home Source of Information:  Patient, Facility Patient Interpreter Needed:  None Criminal Activity/Legal Involvement Pertinent to Current Situation/Hospitalization:  No - Comment as needed Significant Relationships:  Spouse, Other Family Members Lives with:  Spouse Do you feel safe going back to the place where you live?  Yes Need for family participation in patient care:  Yes (Comment)  Care giving concerns:  Facility resident    Social Worker assessment / plan:  Patient was admitted to [facility] approximately two weeks ago for short term rehab purposes.  She plans to return at discharge. Chris at [facility] indicated that patient's husband did not do a bed hold but she can return at discharge upon receipt of authorization and bed availability.   Employment status:  Disabled (Comment on whether or not currently receiving Disability) Insurance information:  Managed Care PT Recommendations:  [place] / Referral to community resources:  [place]  Patient/Family's Response to care:  Patient is agreeable to return to short term rehab at SNF at discharge.  Patient/Family's Understanding of and Emotional Response to Diagnosis, Current Treatment, and Prognosis:  Patient understands her diagnosis, treatment and prognosis and is agreeable to return to complete rehab.   Emotional Assessment Appearance:  Appears stated age Attitude/Demeanor/Rapport:    Affect (typically observed):  Calm,  Accepting Orientation:  Oriented to Self, Oriented to Place, Oriented to  Time, Oriented to Situation Alcohol / Substance use:  Not Applicable Psych involvement (Current and /or in the community):  No (Comment)  Discharge Needs  Concerns to be addressed:  Discharge Planning Concerns Readmission within the last 30 days:  No Current discharge risk:  None Barriers to Discharge:  Insurance Authorization   Juanda Bond 09/03/2018, 4:37 PM

## 2018-09-03 NOTE — Progress Notes (Signed)
Patient admitted to the hospital earlier this morning by Dr. Olevia Bowens.  Patient seen and examined.  Reports overall abdominal pain is better.  Still having some small amount of maroon stools per nursing staff.  Had episodes of hypoglycemia earlier today's which she was n.p.o.  She is chronically on basal insulin and glipizide.  Blood pressure has been soft, but stable.  She was admitted to the hospital with GI bleeding.  She is chronically on Eliquis.  She has a history of atrial fibrillation and LV thrombus.  Will repeat echocardiogram to reevaluate left ventricle.  Anticoagulation currently on hold and she received Andexxa for reversal of Eliquis in the emergency room.  Hemoglobin appears to be stable.  Discussed with Dr. Oneida Alar and small amounts of maroon stool may be residual blood.  Continue to follow serial CBC.  She has evidence of colitis on CT imaging and is on broad-spectrum antibiotics.  This is likely the source of her bleeding.  If she has any bright red blood per rectum or is unable to maintain her hemoglobin, she may need transfer to [facility].  Continue observation for now.  [employer]

## 2018-09-03 NOTE — ED Notes (Signed)
Patient transported to CT 

## 2018-09-03 NOTE — Progress Notes (Signed)
Patient admitted to ICU 12. She is awake, oriented, and can move all ext. Her vitals are stable at this time. She did have small red stool on admission. She denies pain. Patient oriented to call light, and tv. Will continue to monitor closely.

## 2018-09-03 NOTE — Progress Notes (Signed)
Patient CBG at 0900 was 66 she was given D50, and reassessment CBG was 87. She denied symptoms of hypoglycemia at that time.  Patient CBG at 1238 was 41, she c/o nausea. D50 and zofran given. Reassessment of CBG 1311 61, patient was given D50 again. Reassessment CBG 76, patient stated she felt much better.   MD Memon made aware, and new orders recieved for IVF with dextrose. Will continue to monitor closely.

## 2018-09-03 NOTE — ED Notes (Signed)
Patient back from CT scan.

## 2018-09-03 NOTE — ED Notes (Signed)
Patient changed and linen changed. Pure Wick placed on the patient.

## 2018-09-03 NOTE — ED Provider Notes (Signed)
[facility] EMERGENCY DEPARTMENT Provider Note   CSN: [id number] Arrival date & time: 09/02/18  2253  Time seen 11:10 PM   History   Chief Complaint Chief Complaint  Patient presents with  . GI Bleeding    HPI [name] is a 68 y.o. female.  HPI patient presents from EMS from her nursing facility.  She states she has had abdominal pain for a few days and indicates her left mid to lower abdomen.  She states she had vomiting 2 days ago but that was from choking while she was eating.  She states she has trouble swallowing.  She has had nausea but no vomiting today.  She denies diarrhea.  She states she felt like she was constipated and she felt full and she asked for an enema today.  She states they gave her something orally instead.  She states the first time she had a bowel movement after being given the oral medication she had rectal bleeding.  She states she is never had rectal bleeding before.  She states she felt like she has been having a fever however when they checked her temperature it is normal.  She states she was living at home however she broke both of her legs in August and in December she was released from the orthopedist but she was sent to rehab to get her strength back in her legs.  She states she is on a blood thinner which is Eliquis.  When asked why she is on it she states she has a "blood clot in my heart about 1 month ago.  PCP [name], MD   Past Medical History:  Diagnosis Date  . Atrial fibrillation ([place])   . CHF (congestive heart failure) ([place])   . Depression   . Diabetes mellitus without complication ([place])   . 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 Hypertension   . Morbid obesity ([place])   . Osteoporosis   . UTI (lower urinary tract infection) 01/2016   Cipro for Klebsiella pneumoniae isolate    Patient Active Problem List   Diagnosis Date Noted  . GI bleed 09/03/2018  . Dehydration 08/16/2018  .  Acute on chronic diastolic CHF (congestive heart failure) ([place]) 08/10/2018  . Acute lower UTI 08/10/2018  . Hypokalemia 08/10/2018  . COPD (chronic obstructive pulmonary disease) ([place]) 08/10/2018  . Thyroid lesion 08/10/2018  . Closed fracture of right ankle 06/10/2018  . Closed fracture of left tibia and fibula with routine healing 04/13/18 06/10/2018  . Unspecified atrial fibrillation ([place]) 04/15/2018  . Chronic diastolic CHF (congestive heart failure) ([place]) 04/15/2018  . CKD (chronic kidney disease), stage III ([place]) 04/15/2018  . Closed right fibular fracture 04/15/2018  . Left tibial fracture 04/14/2018  . CHF exacerbation ([place]) 04/10/2018  . SOB (shortness of breath)   . Acute CHF (congestive heart failure) ([place]) 04/01/2018  . Atrial fibrillation with RVR ([name]) 04/01/2018  . [place] 03/11/2016  . Muscle spasms of lower extremity 03/04/2016  . Acute renal insufficiency 02/26/2016  . History of MRSA infection 02/26/2016  . Diabetes type 2, uncontrolled ([place]) 02/19/2016  . HTN (hypertension) 02/19/2016  . Fracture, femur, distal ([place]) 02/19/2016  . Fall 02/19/2016  . Depression 02/19/2016  . Closed left femoral fracture, initial encounter 02/19/2016  . Femur fracture, left ([place]) 02/19/2016    Past Surgical History:  Procedure Laterality Date  . ABDOMINAL HYSTERECTOMY    . CESAREAN SECTION    . CHOLECYSTECTOMY    .  FEMUR IM NAIL Left 02/20/2016  . FEMUR IM NAIL Left 02/20/2016   Procedure: INTRAMEDULLARY (IM) RETROGRADE FEMORAL NAILING;  Surgeon: Leandrew Koyanagi, MD;  Location: [place];  Service: Orthopedics;  Laterality: Left;  . PANCREAS SURGERY  1967   1 cyst excised and one cyst drained  . TEE WITHOUT CARDIOVERSION N/A 04/05/2018   Procedure: TRANSESOPHAGEAL ECHOCARDIOGRAM (TEE);  Surgeon: Dorothy Spark, MD;  Location: [place];  Service: Cardiovascular;  Laterality: N/A;  . [place]    . WRIST FRACTURE SURGERY       OB History   No obstetric  history on file.      Home Medications    Prior to Admission medications   Medication Sig Start Date End Date Taking? Authorizing Provider  acetaminophen (TYLENOL) 500 MG tablet Take 1,500-2,000 mg by mouth every 6 (six) hours as needed for mild pain or headache.    [provider]  albuterol (PROVENTIL HFA;VENTOLIN HFA) 108 (90 Base) MCG/ACT inhaler Inhale 2 puffs into the lungs every 6 (six) hours as needed for wheezing or shortness of breath. 08/14/18   Kathie Dike, MD  apixaban (ELIQUIS) 5 MG TABS tablet Take 1 tablet (5 mg total) by mouth 2 (two) times daily. 07/21/18   Herminio Commons, MD  benzonatate (TESSALON PERLES) 100 MG capsule Take 1 capsule (100 mg total) by mouth every 6 (six) hours as needed for cough. 08/24/18 08/24/19  Manuella Ghazi, Pratik D, DO  buPROPion (WELLBUTRIN XL) 300 MG 24 hr tablet Take 300 mg by mouth daily. 07/06/13   [provider]  dextromethorphan-guaiFENesin (MUCINEX DM) 30-600 MG 12hr tablet Take 1 tablet by mouth 2 (two) times daily. 08/14/18   Kathie Dike, MD  dextromethorphan-guaiFENesin (MUCINEX DM) 30-600 MG 12hr tablet Take 1 tablet by mouth 2 (two) times daily for 10 days. 08/24/18 09/03/18  Manuella Ghazi, Pratik D, DO  diltiazem (CARDIZEM CD) 240 MG 24 hr capsule Take 1 capsule (240 mg total) by mouth daily. 08/14/18   Kathie Dike, MD  escitalopram (LEXAPRO) 5 MG tablet Take 1 tablet (5 mg total) by mouth daily. 08/25/18 09/24/18  Manuella Ghazi, Pratik D, DO  fluticasone (FLONASE) 50 MCG/ACT nasal spray Place 2 sprays into both nostrils daily.    [provider]  furosemide (LASIX) 40 MG tablet Take 1 tablet (40 mg total) by mouth daily. 08/14/18   Kathie Dike, MD  glipiZIDE (GLUCOTROL) 10 MG tablet Take 10 mg by mouth 2 (two) times daily. 03/02/18   [provider]  levalbuterol Penne Lash) 0.63 MG/3ML nebulizer solution Take 3 mLs (0.63 mg total) by nebulization every 6 (six) hours as needed for wheezing or shortness of breath.  08/14/18   Kathie Dike, MD  loratadine (CLARITIN) 10 MG tablet Take 10 mg by mouth daily.    [provider]  metoprolol tartrate (LOPRESSOR) 50 MG tablet Take 1 tablet (50 mg total) by mouth 2 (two) times daily. 08/14/18   Kathie Dike, MD  pioglitazone (ACTOS) 45 MG tablet Take 45 mg by mouth daily. 05/17/18   [provider]  potassium chloride SA (K-DUR,KLOR-CON) 20 MEQ tablet Take 1 tablet (20 mEq total) by mouth daily. 07/21/18   Herminio Commons, MD  SPIRIVA RESPIMAT 1.25 MCG/ACT AERS Inhale 2 sprays into the lungs daily. 01/21/18   [provider]  traZODone (DESYREL) 50 MG tablet Take 0.5 tablets (25 mg total) by mouth at bedtime. 08/24/18 09/23/18  Heath Lark D, DO    Family History Family History  Problem Relation Age of Onset  . Diabetes Mother        died in her 74's of a stroke  . Cancer Mother   . Stroke Mother   . Breast cancer Mother   . Diabetes Father        died in his 2's of a stroke.  . Stroke Father   . Diabetes Brother        died @ 66 of a stroke.  . Stroke Brother   . Diabetes Maternal Grandmother   . Diabetes Maternal Grandfather   . Diabetes Paternal Grandmother   . Diabetes Paternal Grandfather   . Breast cancer Maternal Aunt     Social History Social History   Tobacco Use  . Smoking status: Never Smoker  . Smokeless tobacco: Never Used  Substance Use Topics  . Alcohol use: No  . Drug use: No  Married Was living at home until she was sent to rehab for physical strength   Allergies   Latex   Review of Systems Review of Systems  All other systems reviewed and are negative.    Physical Exam Updated Vital Signs BP 99/64   Pulse 69   Temp (!) 97.5 F (36.4 C) (Oral)   Resp (!) 22   Ht 5' 4"  (1.626 m)   Wt 115.7 kg   SpO2 100%   BMI 43.77 kg/m   Vital signs normal    Physical Exam Vitals signs and nursing note reviewed.  Constitutional:      General: She is not in acute distress.     Appearance: Normal appearance. She is well-developed. She is not ill-appearing or toxic-appearing.  HENT:     Head: Normocephalic and atraumatic.     Right Ear: External ear normal.     Left Ear: External ear normal.     Nose: Nose normal. No mucosal edema or rhinorrhea.     Mouth/Throat:     Dentition: No dental abscesses.     Pharynx: No uvula swelling.  Eyes:     Extraocular Movements: Extraocular movements intact.     Pupils: Pupils are equal, round, and reactive to light.     Comments: Conjunctiva are pale  Neck:     Musculoskeletal: Full passive range of motion without pain, normal range of motion and neck supple.  Cardiovascular:     Rate and Rhythm: Normal rate and regular rhythm.     Heart sounds: Normal heart sounds. No murmur. No friction rub. No gallop.   Pulmonary:     Effort: Pulmonary effort is normal. No respiratory distress.     Breath sounds: Normal breath sounds. No wheezing, rhonchi or rales.     Comments: Patient is noted to have a cough at times which she states it is improved from when she had a pneumonia about a month ago. Chest:     Chest wall: No tenderness or crepitus.  Abdominal:     General: Bowel sounds are normal. There is no distension.     Palpations: Abdomen is soft.     Tenderness: There is abdominal tenderness. There is no guarding or rebound.    Musculoskeletal: Normal range of motion.        General: No tenderness.     Comments: Moves all extremities well.   Skin:    General: Skin is warm and dry.     Coloration: Skin is pale.     Findings: No erythema or rash.  Neurological:     Mental Status:  She is alert and oriented to person, place, and time.     Cranial Nerves: No cranial nerve deficit.  Psychiatric:        Mood and Affect: Mood is not anxious.        Speech: Speech normal.        Behavior: Behavior normal.      ED Treatments / Results  Labs (all labs ordered are listed, but only abnormal results are displayed) Results for  orders placed or performed during the hospital encounter of 09/02/18  CBC with Differential  Result Value Ref Range   WBC 11.2 (H) 4.0 - 10.5 K/uL   RBC 3.81 (L) 3.87 - 5.11 MIL/uL   Hemoglobin 10.1 (L) 12.0 - 15.0 g/dL   HCT 35.2 (L) 36.0 - 46.0 %   MCV 92.4 80.0 - 100.0 fL   MCH 26.5 26.0 - 34.0 pg   MCHC 28.7 (L) 30.0 - 36.0 g/dL   RDW 15.9 (H) 11.5 - 15.5 %   Platelets 326 150 - 400 K/uL   nRBC 0.0 0.0 - 0.2 %   Neutrophils Relative % 83 %   Neutro Abs 9.4 (H) 1.7 - 7.7 K/uL   Lymphocytes Relative 8 %   Lymphs Abs 0.9 0.7 - 4.0 K/uL   Monocytes Relative 8 %   Monocytes Absolute 0.8 0.1 - 1.0 K/uL   Eosinophils Relative 1 %   Eosinophils Absolute 0.1 0.0 - 0.5 K/uL   Basophils Relative 0 %   Basophils Absolute 0.0 0.0 - 0.1 K/uL   Immature Granulocytes 0 %   Abs Immature Granulocytes 0.03 0.00 - 0.07 K/uL  Comprehensive metabolic panel  Result Value Ref Range   Sodium 135 135 - 145 mmol/L   Potassium 3.4 (L) 3.5 - 5.1 mmol/L   Chloride 97 (L) 98 - 111 mmol/L   CO2 28 22 - 32 mmol/L   Glucose, Bld 237 (H) 70 - 99 mg/dL   BUN 37 (H) 8 - 23 mg/dL   Creatinine, Ser 1.07 (H) 0.44 - 1.00 mg/dL   Calcium 7.8 (L) 8.9 - 10.3 mg/dL   Total Protein 6.1 (L) 6.5 - 8.1 g/dL   Albumin 2.7 (L) 3.5 - 5.0 g/dL   AST 19 15 - 41 U/L   ALT 12 0 - 44 U/L   Alkaline Phosphatase 81 38 - 126 U/L   Total Bilirubin 0.9 0.3 - 1.2 mg/dL   GFR calc non Af Amer 54 (L) >60 mL/min   GFR calc Af Amer >60 >60 mL/min   Anion gap 10 5 - 15  Protime-INR  Result Value Ref Range   Prothrombin Time 21.9 (H) 11.4 - 15.2 seconds   INR 1.94   APTT  Result Value Ref Range   aPTT 39 (H) 24 - 36 seconds  Heparin level - if patient on rivaroxaban (XARELTO) or apixaban (ELIQUIS)  Result Value Ref Range   Heparin Unfractionated >2.20 (H) 0.30 - 0.70 IU/mL  POC occult blood, ED RN will collect  Result Value Ref Range   Fecal Occult Bld POSITIVE (A) NEGATIVE  Type and screen [facility]  Result  Value Ref Range   ABO/RH(D) A POS    Antibody Screen NEG    Sample Expiration 09/05/2018    Unit Number [id number]    Blood Component Type RED CELLS,LR    Unit division 00    Status of Unit ALLOCATED    Transfusion Status      OK TO TRANSFUSE Performed at [facility], [address], [place], [place] [address]    Crossmatch Result PENDING    Unit Number [id number]    Blood Component Type RED CELLS,LR    Unit division 00    Status of Unit ALLOCATED    Transfusion Status OK TO TRANSFUSE    Crossmatch Result PENDING   Prepare RBC  Result Value Ref Range   Order Confirmation      ORDER PROCESSED BY BLOOD BANK Performed at [facility], [address], [place], [place] [address]   ABO/Rh  Result Value Ref Range   ABO/RH(D)      A POS Performed at [facility], [address] [place], [place] [address]   [facility]  Result Value Ref Range   Blood Product Unit Number [id number]    Unit Type and Rh 6200    Blood Product Expiration Date 841324401027    Blood Product Unit Number [id number]    Unit Type and Rh 6200    Blood Product Expiration Date [id number]    Laboratory interpretation all normal except malnutrition, elevation of BUN consistent with GI bleed, heparin unfractionated level elevated, stable anemia     EKG EKG Interpretation  Date/Time:  Thursday September 02 2018 22:56:27 EST Ventricular Rate:  90 PR Interval:    QRS Duration: 98 QT Interval:  404 QTC Calculation: 522 R Axis:   35 Text Interpretation:  Atrial fibrillation Borderline low voltage, extremity leads Borderline repolarization abnormality Prolonged QT interval Baseline wander No significant change since last tracing 16 Aug 2018 Confirmed by Rolland Porter [phone]) on 09/03/2018 3:10:16 AM     Radiology Ct Abdomen Pelvis W Contrast  Result Date: 09/03/2018 CLINICAL DATA:  68 year old female with left-sided abdominal pain and rectal bleeding. EXAM: CT ABDOMEN AND PELVIS WITH CONTRAST  TECHNIQUE: Multidetector CT imaging of the abdomen and pelvis was performed using the standard protocol following bolus administration of intravenous contrast. CONTRAST:  157m ISOVUE-300 IOPAMIDOL (ISOVUE-300) INJECTION 61% COMPARISON:  Renal ultrasound dated 04/05/2018. FINDINGS: Lower chest: There is a small right pleural effusion with associated right lung base partial atelectasis. Pneumonia is not excluded. Clinical correlation is recommended. There is mild cardiomegaly. There is calcification of the mitral annulus. No intra-abdominal free air or free fluid. Hepatobiliary: The liver is unremarkable. Cholecystectomy. There is dilatation of the common bile duct measuring up to 19 mm. No calcified gallstone. Pancreas: The pancreas is poorly visualized and appears atrophic with fatty infiltration. 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 Further evaluation with MRI on a nonemergent basis recommended. Spleen: Normal in size without focal abnormality. Adrenals/Urinary Tract: The adrenal glands are unremarkable. Mild renal parenchyma atrophy and cortical irregularity and thinning. Subcentimeter left renal hypodense lesions are too small to characterize. There is no hydronephrosis on either side. There is symmetric enhancement and excretion of contrast by both kidneys. The visualized ureters appear unremarkable. Minimal thickened appearance of the bladder wall, likely related to underdistention. Stomach/Bowel: Loose stool throughout the colon compatible with diarrheal state. There is diffuse enhancement of the colonic mucosa most consistent with colitis. There is sigmoid diverticulosis without active inflammatory changes. There is no bowel obstruction. The appendix is not identified with certainty and likely surgically absent. Vascular/Lymphatic: There is moderate aortoiliac  atherosclerotic disease. No portal venous gas. There is no adenopathy. Reproductive: Hysterectomy. There is a 4.5 x 4.2 cm  multicystic lesion in the right ovary. Further evaluation with ultrasound on a nonemergent basis recommended. Depending on the ultrasound finding additional evaluation with MRI the required. A 1.3 cm rounded nodular density in the left anterior pelvis (series 3, image 67) likely represents a lymph node. Other: Mild diffuse subcutaneous edema. No fluid collection. Musculoskeletal: Degenerative changes of the spine and osteopenia. Partially visualized left femoral fixation rod. No acute osseous pathology. Grade 1 L4-L5 anterolisthesis. IMPRESSION: 1. Diarrheal state with findings of colitis. Correlation with clinical exam and stool cultures recommended. No bowel obstruction. 2. Sigmoid diverticulosis without active inflammatory changes. 3. Cystic lesion in the region of the body of the pancreas. Further evaluation with MRI on a nonemergent basis recommended. 4. Right ovarian multicystic lesion. Further evaluation with pelvic ultrasound recommended. 5. Small right pleural effusion with associated right lung base partial atelectasis. Pneumonia is not excluded. Clinical correlation is recommended. Electronically Signed   By: Anner Crete M.D.   On: 09/03/2018 02:15      Ct Angio Chest Pe W/cm &/or Wo Cm  Result Date: 08/10/2018 CLINICAL DATA:  Shortness of breath . Marland Kitchen IMPRESSION: 1. No demonstrable pulmonary embolus. No thoracic aortic aneurysm or dissection. There is aortic atherosclerosis. There are foci of coronary artery calcification. 2. Sizable free-flowing right pleural effusion with consolidation throughout much of the right lower lobe. There is atelectatic change in the posterior segment of the right upper lobe. 3. Atelectatic change left base. Mild bronchiectasis left lower lobe. 4. **An incidental finding of potential clinical significance has been found. 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 Consider further evaluation with thyroid ultrasound nonemergently. If patient is clinically hyperthyroid, consider nuclear medicine thyroid uptake and scan.** 5.  No evident thoracic adenopathy. 6.  Gallbladder absent. Aortic Atherosclerosis (ICD10-I70.0). Electronically Signed   By: Lowella Grip III M.D.   On: 08/10/2018 11:18     Dg Esophagus Inc Scout Chest & Delayed Img Single Cm (ba Or Sol)  Result Date: 08/23/2018 CLINICAL DATA:  68 year old female with history of dysphagia.IMPRESSION: 1. Limited examination, as above. Today's study demonstrates nonspecific esophageal motility disorder with intermittent tertiary contractions. Electronically Signed   By: Vinnie Langton M.D.   On: 08/23/2018 15:48    Procedures .Critical Care Performed by: Rolland Porter, MD Authorized by: Rolland Porter, MD   Critical care provider statement:    Critical care time (minutes):  40   Critical care was necessary to treat or prevent imminent or life-threatening deterioration of the following conditions:  Circulatory failure   Critical care was time spent personally by me on the following activities:  Discussions with consultants, evaluation of patient's response to treatment, examination of patient, obtaining history from patient or surrogate, ordering and review of laboratory studies, ordering and review of radiographic studies, pulse oximetry, re-evaluation of patient's condition and review of old charts   (including critical care time)  Echocardiogram April 05, 2018 - Left ventricle: Systolic function was normal. The estimated   ejection fraction was in the range of 55% to 60%. Wall motion was   normal; there were no regional wall motion abnormalities. - Mitral valve: There was mild regurgitation. - Left atrium: The atrium was moderately dilated. No evidence of   thrombus in the atrial cavity or appendage. No evidence of   thrombus in the atrial cavity or appendage. -  Right ventricle: The cavity size was mildly dilated. Wall   thickness was normal. Systolic function was mildly reduced. - Right atrium: The  atrium was moderately dilated. No evidence of   thrombus in the atrial cavity or appendage. - Tricuspid valve: There was moderate-severe regurgitation. - Pulmonary arteries: Systolic pressure was moderately increased.   PA peak pressure: 47 mm Hg (S).  Impressions:  - A large thrombus measuring 18 x 12 mm is seen in the left atrial   apex. The cardioversion was not performed.  Medications Ordered in ED Medications  sodium chloride 0.9 % bolus 1,000 mL (1,000 mLs Intravenous New Bag/Given 09/03/18 0200)  cefTRIAXone (ROCEPHIN) 2 g in sodium chloride 0.9 % 100 mL IVPB (has no administration in time range)    And  metroNIDAZOLE (FLAGYL) IVPB 500 mg (has no administration in time range)  sodium chloride 0.9 % bolus 1,000 mL (0 mLs Intravenous Stopped 09/03/18 0127)  coag fact Xa recombinant (ANDEXXA) low dose infusion 900 mg (900 mg Intravenous New Bag/Given 09/03/18 0059)  iopamidol (ISOVUE-300) 61 % injection 100 mL (100 mLs Intravenous Contrast Given 09/03/18 0148)     Initial Impression / Assessment and Plan / ED Course  I have reviewed the triage vital signs and the nursing notes.  Pertinent labs & imaging results that were available during my care of the patient were reviewed by me and considered in my medical decision making (see chart for details).     Nurses report when patient arrived she had a large amount of maroon stool. IV was started and IV fluids were given.  Laboratory testing was done.  I was afraid to send her to CT when she first arrived, she does not have a tachycardia however she is on a beta-blocker because of her history of atrial fibrillation.  Her blood pressure was borderline at 226 systolic.  Patient is on Eliquis and Andexxa was ordered.  When I review her old charts I do not see any recent echocardiograms in the past month  to show she had a intracardiac blood clot, she also had a recent CTA of the chest that was negative for PE.  She did have a thrombus in her left atrium but that was in August 2019.  Hopefully that clot should be resolved by now.  Therefore I felt it was prudent to proceed with Andexxa.  Patient's blood pressure improved after IV fluids, CT of the abdomen/pelvis was done.  After reviewing her labs her hemoglobin is stable, the transfusion order was canceled but can be redone at any point.  12:45 AM nurse reports patient had 1 more episode of maroon stool.  Patient's blood pressure did drop into the 90 range, she received a second bolus of IV fluids.  Her blood pressure then stabilized around 333 systolic again.  After reviewing patient's CT scan she was started on antibiotics for colitis.  There was a question of pneumonia on the CT however she is recovering from pneumonia, I will leave it to the hospitalist to decide if they want to change her antibiotics.  02:58 AM Dr Olevia Bowens, hospitalist, will admit  3 AM I discussed her test results with patient and her husband.  She understands she will need to be admitted.  Her blood pressure is 545 systolic.  Final Clinical Impressions(s) / ED Diagnoses   Final diagnoses:  Rectal bleeding  Lower abdominal pain  Colitis  Hypotension due to hypovolemia  Coagulopathy [facility])    Plan admission  Rolland Porter, MD, Barbette Or, MD 09/03/18 [phone]

## 2018-09-03 NOTE — H&P (View-Only) (Signed)
Referring Provider: [facility] Primary Care Physician:  [name], MD Primary Gastroenterologist:  Dr. Oneida Alar (previously unassigned)  Date of Admission: 09/02/2018 Date of Consultation: 09/03/2018  Reason for Consultation:  GI bleed  HPI:  [name] is a 68 y.o. female with a past medical history of atrial fibrillation, CHF, diabetes, hypertension, left ventricular thrombus and subsequent anticoagulation with Eliquis, CHF.  She fractured both of her legs and August 2019 in December 2019 and although she was released from the orthopedist she was sent to a rehab facility.  The rehab facility brought her to the ER yesterday with rectal bleeding.  Apparently yesterday she felt constipated and was given a laxative which resulted in a bowel movement with rectal bleeding.  She has never had rectal bleeding before.  She was also complaining of abdominal pain.  She did have some nausea and vomiting but this is when "something got stuck on my throat while I was eating."  While in the emergency department she did have apparently 2 bloody bowel movements.  Her blood pressure was soft in the 90s to low 100s and she was supported with IV fluids.  She was not tachycardic but is on a beta-blocker for atrial fibrillation.  CT scan completed found diarrheal state with findings of colitis but no obstruction.  Sigmoid diverticulosis without diverticulitis.  Cystic lesion in the pancreas recommended MRI follow-up on a nonemergent basis.  Right ovarian multicystic lesion and further evaluation recommended.  Previously completed diagnostic esophagus 08/23/2018 found nonspecific esophageal motility disorder.  Devious echocardiogram dated 04/05/2018 demonstrating a large thrombus measuring 18 x 12 mm in the left atrial apex.  Given soft blood pressures and atrial thrombus documented in August (hopefully resolved) the patient's Eliquis was reversed with Andexxa.  Her hemoglobin was stable at 10.1  (baseline 9 - 10).  An H&H drawn this morning found declined to 9.0.  Possible hydration effect although difficult to determine without full CBC.  Her FOBT was positive in the emergency department.  She was subsequently admitted and a GI consult was placed for patient with GI bleeding, anemia, abdominal pain.  The hospitalist has placed her on n.p.o. status and ordered Protonix twice daily.  Today she confirms the above information. Per nursing, she had 'a smear" of bright red/maroon blood this morning. Last food intake day before yesterday (applesauce). LLQ abdominal pain. Denies chest pain, dyspnea, weakness, fatigue, syncope. Has never had bleeding like this before. Last colonoscopy age 88, not sure where or by whom; thinks she may have had a couple polyps, but not sure. No other GI complaints at this time.  Past Medical History:  Diagnosis Date  . Atrial fibrillation ([place])   . CHF (congestive heart failure) ([place])   . Depression   . Diabetes mellitus without complication ([place])   . 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 Hypertension   . Morbid obesity ([place])   . Osteoporosis   . UTI (lower urinary tract infection) 01/2016   Cipro for Klebsiella pneumoniae isolate    Past Surgical History:  Procedure Laterality Date  . ABDOMINAL HYSTERECTOMY    . CESAREAN SECTION    . CHOLECYSTECTOMY    . FEMUR IM NAIL Left 02/20/2016  . FEMUR IM NAIL Left 02/20/2016   Procedure: INTRAMEDULLARY (IM) RETROGRADE FEMORAL NAILING;  Surgeon: Leandrew Koyanagi, MD;  Location: [place];  Service: Orthopedics;  Laterality: Left;  . PANCREAS SURGERY  1967   1 cyst excised and one cyst  drained  . TEE WITHOUT CARDIOVERSION N/A 04/05/2018   Procedure: TRANSESOPHAGEAL ECHOCARDIOGRAM (TEE);  Surgeon: Dorothy Spark, MD;  Location: [place];  Service: Cardiovascular;  Laterality: N/A;  . [place]    . WRIST FRACTURE SURGERY      Prior to Admission medications    Medication Sig Start Date End Date Taking? Authorizing Provider  acetaminophen (TYLENOL) 500 MG tablet Take 1,500-2,000 mg by mouth every 6 (six) hours as needed for mild pain or headache.    [provider]  albuterol (PROVENTIL HFA;VENTOLIN HFA) 108 (90 Base) MCG/ACT inhaler Inhale 2 puffs into the lungs every 6 (six) hours as needed for wheezing or shortness of breath. 08/14/18   Kathie Dike, MD  apixaban (ELIQUIS) 5 MG TABS tablet Take 1 tablet (5 mg total) by mouth 2 (two) times daily. 07/21/18   Herminio Commons, MD  benzonatate (TESSALON PERLES) 100 MG capsule Take 1 capsule (100 mg total) by mouth every 6 (six) hours as needed for cough. 08/24/18 08/24/19  Manuella Ghazi, Pratik D, DO  buPROPion (WELLBUTRIN XL) 300 MG 24 hr tablet Take 300 mg by mouth daily. 07/06/13   [provider]  dextromethorphan-guaiFENesin (MUCINEX DM) 30-600 MG 12hr tablet Take 1 tablet by mouth 2 (two) times daily. 08/14/18   Kathie Dike, MD  dextromethorphan-guaiFENesin (MUCINEX DM) 30-600 MG 12hr tablet Take 1 tablet by mouth 2 (two) times daily for 10 days. 08/24/18 09/03/18  Manuella Ghazi, Pratik D, DO  diltiazem (CARDIZEM CD) 240 MG 24 hr capsule Take 1 capsule (240 mg total) by mouth daily. 08/14/18   Kathie Dike, MD  escitalopram (LEXAPRO) 5 MG tablet Take 1 tablet (5 mg total) by mouth daily. 08/25/18 09/24/18  Manuella Ghazi, Pratik D, DO  fluticasone (FLONASE) 50 MCG/ACT nasal spray Place 2 sprays into both nostrils daily.    [provider]  furosemide (LASIX) 40 MG tablet Take 1 tablet (40 mg total) by mouth daily. 08/14/18   Kathie Dike, MD  glipiZIDE (GLUCOTROL) 10 MG tablet Take 10 mg by mouth 2 (two) times daily. 03/02/18   [provider]  levalbuterol Penne Lash) 0.63 MG/3ML nebulizer solution Take 3 mLs (0.63 mg total) by nebulization every 6 (six) hours as needed for wheezing or shortness of breath. 08/14/18   Kathie Dike, MD  loratadine (CLARITIN) 10 MG tablet Take 10 mg by mouth  daily.    [provider]  metoprolol tartrate (LOPRESSOR) 50 MG tablet Take 1 tablet (50 mg total) by mouth 2 (two) times daily. 08/14/18   Kathie Dike, MD  pioglitazone (ACTOS) 45 MG tablet Take 45 mg by mouth daily. 05/17/18   [provider]  potassium chloride SA (K-DUR,KLOR-CON) 20 MEQ tablet Take 1 tablet (20 mEq total) by mouth daily. 07/21/18   Herminio Commons, MD  SPIRIVA RESPIMAT 1.25 MCG/ACT AERS Inhale 2 sprays into the lungs daily. 01/21/18   [provider]  traZODone (DESYREL) 50 MG tablet Take 0.5 tablets (25 mg total) by mouth at bedtime. 08/24/18 09/23/18  Heath Lark D, DO    Current Facility-Administered Medications  Medication Dose Route Frequency Provider Last Rate Last Dose  . 0.9 % NaCl with KCl 40 mEq / L  infusion   Intravenous Continuous Reubin Milan, MD 88 mL/hr at 09/03/18 0647 88 mL/hr at 09/03/18 0647  . acetaminophen (TYLENOL) tablet 650 mg  650 mg Oral Q6H PRN Reubin Milan, MD       Or  . acetaminophen (TYLENOL) suppository 650 mg  650 mg Rectal Q6H PRN Reubin Milan, MD      . fentaNYL (SUBLIMAZE) injection 12.5 mcg  12.5 mcg Intravenous Q2H PRN Reubin Milan, MD   12.5 mcg at 09/03/18 8315  . ondansetron (ZOFRAN) tablet 4 mg  4 mg Oral Q6H PRN Reubin Milan, MD       Or  . ondansetron [facility]) injection 4 mg  4 mg Intravenous Q6H PRN Reubin Milan, MD   4 mg at 09/03/18 0346  . pantoprazole (PROTONIX) injection 40 mg  40 mg Intravenous Q24H Reubin Milan, MD   40 mg at 09/03/18 0345   Current Outpatient Medications  Medication Sig Dispense Refill  . acetaminophen (TYLENOL) 500 MG tablet Take 1,500-2,000 mg by mouth every 6 (six) hours as needed for mild pain or headache.    . albuterol (PROVENTIL HFA;VENTOLIN HFA) 108 (90 Base) MCG/ACT inhaler Inhale 2 puffs into the lungs every 6 (six) hours as needed for wheezing or shortness of breath. 1 Inhaler 2  . apixaban (ELIQUIS) 5 MG TABS  tablet Take 1 tablet (5 mg total) by mouth 2 (two) times daily. 60 tablet 3  . benzonatate (TESSALON PERLES) 100 MG capsule Take 1 capsule (100 mg total) by mouth every 6 (six) hours as needed for cough. 30 capsule 0  . buPROPion (WELLBUTRIN XL) 300 MG 24 hr tablet Take 300 mg by mouth daily.    Marland Kitchen dextromethorphan-guaiFENesin (MUCINEX DM) 30-600 MG 12hr tablet Take 1 tablet by mouth 2 (two) times daily. 30 tablet 0  . dextromethorphan-guaiFENesin (MUCINEX DM) 30-600 MG 12hr tablet Take 1 tablet by mouth 2 (two) times daily for 10 days. 20 tablet 0  . diltiazem (CARDIZEM CD) 240 MG 24 hr capsule Take 1 capsule (240 mg total) by mouth daily. 30 capsule 1  . escitalopram (LEXAPRO) 5 MG tablet Take 1 tablet (5 mg total) by mouth daily. 30 tablet 0  . fluticasone (FLONASE) 50 MCG/ACT nasal spray Place 2 sprays into both nostrils daily.    . furosemide (LASIX) 40 MG tablet Take 1 tablet (40 mg total) by mouth daily. 30 tablet 1  . glipiZIDE (GLUCOTROL) 10 MG tablet Take 10 mg by mouth 2 (two) times daily.  2  . levalbuterol (XOPENEX) 0.63 MG/3ML nebulizer solution Take 3 mLs (0.63 mg total) by nebulization every 6 (six) hours as needed for wheezing or shortness of breath. 3 mL 12  . loratadine (CLARITIN) 10 MG tablet Take 10 mg by mouth daily.    . metoprolol tartrate (LOPRESSOR) 50 MG tablet Take 1 tablet (50 mg total) by mouth 2 (two) times daily. 60 tablet 0  . pioglitazone (ACTOS) 45 MG tablet Take 45 mg by mouth daily.  2  . potassium chloride SA (K-DUR,KLOR-CON) 20 MEQ tablet Take 1 tablet (20 mEq total) by mouth daily. 30 tablet 11  . SPIRIVA RESPIMAT 1.25 MCG/ACT AERS Inhale 2 sprays into the lungs daily.  2  . traZODone (DESYREL) 50 MG tablet Take 0.5 tablets (25 mg total) by mouth at bedtime. 15 tablet 0    Allergies as of 09/02/2018 - Review Complete 09/02/2018  Allergen Reaction Noted  . Latex Itching and Rash 04/01/2018    Family History  Problem Relation Age of Onset  . Diabetes  Mother        died in her 83's of a stroke  . Cancer Mother   . Stroke Mother   . Breast cancer Mother   . Diabetes Father  died in his 47's of a stroke.  . Stroke Father   . Diabetes Brother        died @ 42 of a stroke.  . Stroke Brother   . Diabetes Maternal Grandmother   . Diabetes Maternal Grandfather   . Diabetes Paternal Grandmother   . Diabetes Paternal Grandfather   . Breast cancer Maternal Aunt     Social History   Socioeconomic History  . Marital status: Married    Spouse name: Not on file  . Number of children: Not on file  . Years of education: Not on file  . Highest education level: Not on file  Occupational History  . Occupation: retired  Scientific laboratory technician  . Financial resource strain: Not on file  . Food insecurity:    Worry: Not on file    Inability: Not on file  . Transportation needs:    Medical: Not on file    Non-medical: Not on file  Tobacco Use  . Smoking status: Never Smoker  . Smokeless tobacco: Never Used  Substance and Sexual Activity  . Alcohol use: No  . Drug use: No  . Sexual activity: Not on file  Lifestyle  . Physical activity:    Days per week: Not on file    Minutes per session: Not on file  . Stress: Not on file  Relationships  . Social connections:    Talks on phone: Not on file    Gets together: Not on file    Attends religious service: Not on file    Active member of club or organization: Not on file    Attends meetings of clubs or organizations: Not on file    Relationship status: Not on file  . Intimate partner violence:    Fear of current or ex partner: Not on file    Emotionally abused: Not on file    Physically abused: Not on file    Forced sexual activity: Not on file  Other Topics Concern  . Not on file  Social History Narrative   Lives in [place] with husband.  More or less w/c bound since leg fx about 3 yrs ago.    Review of Systems: General: Negative for anorexia, weight loss, fever, chills,  fatigue, weakness.  ENT: Negative for hoarseness. Some mild dysphagia. CV: Negative for chest pain, angina, palpitations, peripheral edema.  Respiratory: Negative for dyspnea at rest,  cough, sputum, wheezing.  GI: See history of present illness. MS: Negative for joint pain, low back pain.  Derm: Negative for rash or itching.  Endo: Negative for unusual weight change.  Heme: Negative for bruising or bleeding. Allergy: Negative for rash or hives.  Physical Exam: Vital signs in last 24 hours: Temp:  [97.5 F (36.4 C)] 97.5 F (36.4 C) (01/16 2257) Pulse Rate:  [65-86] 80 (01/17 0608) Resp:  [13-27] 18 (01/17 0608) BP: (90-133)/(49-104) 106/66 (01/17 0600) SpO2:  [89 %-100 %] 97 % (01/17 0608) Weight:  [115.7 kg] 115.7 kg (01/16 2256)   General:   Alert,  Well-developed, well-nourished, pleasant and cooperative in NAD Head:  Normocephalic and atraumatic. Eyes:  Sclera clear, no icterus. Conjunctiva pink. Ears:  Normal auditory acuity. Neck:  Supple; no masses or thyromegaly. Lungs:  Clear throughout to auscultation. No wheezes, crackles, or rhonchi. No acute distress. Occasional non-productive cough. Heart:  Regular rate and rhythm; no murmurs, clicks, rubs,  or gallops. Abdomen:  Soft, nontender and nondistended. No masses, hepatosplenomegaly or hernias noted. Normal bowel sounds, without guarding,  and without rebound.   Rectal:  Deferred.   Msk:  Symmetrical without gross deformities. Pulses:  Normal bilateral DP pulses noted. Extremities:  Without clubbing or edema. Neurologic:  Alert and  oriented x4;  grossly normal neurologically. Skin:  Intact without significant lesions or rashes. Psych:  Alert and cooperative. Normal mood and affect.  Intake/Output from previous day: 01/16 0701 - 01/17 0700 In: 2200 [IV Piggyback:2200] Out: -  Intake/Output this shift: No intake/output data recorded.  Lab Results: Recent Labs    09/02/18 2335 09/03/18 0427  WBC 11.2*  --   HGB  10.1* 9.0*  HCT 35.2* 31.4*  PLT 326  --    BMET Recent Labs    09/02/18 2335  NA 135  K 3.4*  CL 97*  CO2 28  GLUCOSE 237*  BUN 37*  CREATININE 1.07*  CALCIUM 7.8*   LFT Recent Labs    09/02/18 2335  PROT 6.1*  ALBUMIN 2.7*  AST 19  ALT 12  ALKPHOS 81  BILITOT 0.9   PT/INR Recent Labs    09/02/18 2335  LABPROT 21.9*  INR 1.94   Hepatitis Panel No results for input(s): HEPBSAG, HCVAB, HEPAIGM, HEPBIGM in the last 72 hours. C-Diff No results for input(s): CDIFFTOX in the last 72 hours.  Studies/Results: Ct Abdomen Pelvis W Contrast  Result Date: 09/03/2018 CLINICAL DATA:  68 year old female with left-sided abdominal pain and rectal bleeding. EXAM: CT ABDOMEN AND PELVIS WITH CONTRAST TECHNIQUE: Multidetector CT imaging of the abdomen and pelvis was performed using the standard protocol following bolus administration of intravenous contrast. CONTRAST:  11m ISOVUE-300 IOPAMIDOL (ISOVUE-300) INJECTION 61% COMPARISON:  Renal ultrasound dated 04/05/2018. FINDINGS: Lower chest: There is a small right pleural effusion with associated right lung base partial atelectasis. Pneumonia is not excluded. Clinical correlation is recommended. There is mild cardiomegaly. There is calcification of the mitral annulus. No intra-abdominal free air or free fluid. Hepatobiliary: The liver is unremarkable. Cholecystectomy. There is dilatation of the common bile duct measuring up to 19 mm. No calcified gallstone. Pancreas: The pancreas is poorly visualized and appears atrophic with fatty infiltration. 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 Further evaluation with MRI on a nonemergent basis recommended. Spleen: Normal in size without focal abnormality. Adrenals/Urinary Tract: The adrenal glands are unremarkable. Mild renal parenchyma atrophy and cortical  irregularity and thinning. Subcentimeter left renal hypodense lesions are too small to characterize. There is no hydronephrosis on either side. There is symmetric enhancement and excretion of contrast by both kidneys. The visualized ureters appear unremarkable. Minimal thickened appearance of the bladder wall, likely related to underdistention. Stomach/Bowel: Loose stool throughout the colon compatible with diarrheal state. There is diffuse enhancement of the colonic mucosa most consistent with colitis. There is sigmoid diverticulosis without active inflammatory changes. There is no bowel obstruction. The appendix is not identified with certainty and likely surgically absent. Vascular/Lymphatic: There is moderate aortoiliac atherosclerotic disease. No portal venous gas. There is no adenopathy. Reproductive: Hysterectomy. There is a 4.5 x 4.2 cm multicystic lesion in the right ovary. Further evaluation with ultrasound on a nonemergent basis recommended. Depending on the ultrasound finding additional evaluation with MRI the required. A 1.3 cm rounded nodular density in the left anterior pelvis (series 3, image 67) likely represents a lymph node. Other: Mild diffuse subcutaneous edema. No fluid collection. Musculoskeletal: Degenerative  changes of the spine and osteopenia. Partially visualized left femoral fixation rod. No acute osseous pathology. Grade 1 L4-L5 anterolisthesis. IMPRESSION: 1. Diarrheal state with findings of colitis. Correlation with clinical exam and stool cultures recommended. No bowel obstruction. 2. Sigmoid diverticulosis without active inflammatory changes. 3. Cystic lesion in the region of the body of the pancreas. Further evaluation with MRI on a nonemergent basis recommended. 4. Right ovarian multicystic lesion. Further evaluation with pelvic ultrasound recommended. 5. Small right pleural effusion with associated right lung base partial atelectasis. Pneumonia is not excluded. Clinical  correlation is recommended. Electronically Signed   By: Anner Crete M.D.   On: 09/03/2018 02:15    Impression: Pleasant 68 year old female with multiple comorbidities including CHF, atrial thrombus on Eliquis, diabetes, hypertension.  She presented from rehab facility where she has been staying since bilateral leg fractures in August and December 2019.  They noted 2-4 episodes of rectal bleeding.  She had further rectal bleeding in the emergency department and had "a smear" of red/maroon blood this morning.  Her baseline hemoglobin seems to be about 9.5.  When she came in the emergency room she was mildly hypotensive, not tachycardic although at "home "she is on antihypertensives.  These have been held since her admission.  Her initial hemoglobin was 10.1.  Overnight this dropped to 9.0.  She does not have overt symptoms of significant anemia.  Her blood pressure does remain soft today between 96 and 270 systolic while I was in the room.  Additionally the patient had diarrhea state and colitis on CT suggestive of infectious colitis. She has been started on antibiotics for this. No overt diarrhea per the patient.  This week and we do not have GI coverage.  I discussed the case with Dr. Roderic Palau.  We agreed to closely monitor the patient and transfuse as necessary.  If the patient does have further GI bleeding while here she would need to be transferred to [facility] for consideration of endoscopic evaluation.  Plan: 1. Check CBC this morning 2. Monitor hgb closely 3. Monitor for any further GI bleed 4. May need transfer to [facility] if endoscopic evaluation is needed 5. Transfuse as necessary 6. Continue antibiotics 7. Supportive measures  **PLEASE NOTE: There will be no GI coverage at [facility] from 5pm today (09/03/2018) until 7am Monday (09/06/2018).**  Thank you for allowing [place] to participate in the care of [place], DNP, AGNP-C Adult & Gerontological Nurse Practitioner [facility]    LOS: 0 days     09/03/2018, 8:02 AM

## 2018-09-03 NOTE — H&P (Signed)
History and Physical    [name] EGB:[id number] DOB: [birth year] DOA: 09/02/2018  PCP: [name], MD   Patient coming from: Home.  I have personally briefly reviewed patient's old medical records in [place]  Chief Complaint: Bloody stools.  HPI: [name] is a 68 y.o. female with medical history significant of chronic atrial fibrillation, chronic diastolic CHF, depression, type 2 diabetes, history of left ventricle thrombus, hypertension, morbid obesity, osteoporosis, UTI who is coming from the [facility] where she has been admitted for rehabilitation after having fractures on both of her lower extremities in August and December.  She was recently admitted to the hospital first with acute on chronic diastolic CHF and 6 days later was admitted due to dehydration who is coming to the emergency department due to hematochezia.  Per patient, she has been constipated for several days.  She has been nauseous and 2 days ago had a couple episodes of emesis.  Yesterday, Thursday, 09/02/2018, earlier in the day she was given two different laxatives and was unable to have a full bowel movement.  However, while she was in bed, she had incomplete bowel movements with a significant blood amount and maroon stools.  She denies hematemesis, but complains of abdominal pain and nausea.  No dysuria, frequency or hematuria.  She denies recent days fever, chills, but complains of sweats.  She denies dyspnea, sore throat, wheezing, hemoptysis, chest pain, palpitations, dizziness, diaphoresis, PND, orthopnea, but occasionally gets pitting edema of the lower extremities.  She denies polyuria, polydipsia, polyphagia or blurred vision.  ED Course: Initial vital signs temperature 97.5 F, pulse 86, respirations 22, blood pressure 100/65 mmHg and O2 sat 89% on room air.  In the emergency department she received a 1000 mL NS bolus, ceftriaxone 2 g IVPB, metronidazole 500 mg IVPB and  Andexxa.  Lab work reveals a fecal occult blood positive, white count of 11.2 with 83% neutrophils, 8% lymphocytes and 8% monocytes.  Her hemoglobin is 10.1 g/dL, which is slightly higher than her baseline which is normally 9+.  Platelets were 326.  PT was 21.9, INR 1.94 and APTT 39 seconds.  CMP shows a potassium of 3.4 and chloride 97 mmol S/L.  All other electrolytes are within normal limits 1 calcium of 7.8 mg/dL is corrected to an albumin of 2.7 g/dL.  BUN was 37, creatinine 1.07 and glucose 237 mg/dL.  Total protein was also low at 6.1 g/dL.  The rest of the LFTs are within normal limits.  Review of Systems: As per HPI otherwise 10 point review of systems negative.   Past Medical History:  Diagnosis Date  . Atrial fibrillation ([place])   . CHF (congestive heart failure) ([place])   . Depression   . Diabetes mellitus without complication ([place])   . 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 Hypertension   . Morbid obesity ([place])   . Osteoporosis   . UTI (lower urinary tract infection) 01/2016   Cipro for Klebsiella pneumoniae isolate    Past Surgical History:  Procedure Laterality Date  . ABDOMINAL HYSTERECTOMY    . CESAREAN SECTION    . CHOLECYSTECTOMY    . FEMUR IM NAIL Left 02/20/2016  . FEMUR IM NAIL Left 02/20/2016   Procedure: INTRAMEDULLARY (IM) RETROGRADE FEMORAL NAILING;  Surgeon: Leandrew Koyanagi, MD;  Location: [facility];  Service: Orthopedics;  Laterality: Left;  . PANCREAS SURGERY  1967   1 cyst excised and one cyst drained  .  TEE WITHOUT CARDIOVERSION N/A 04/05/2018   Procedure: TRANSESOPHAGEAL ECHOCARDIOGRAM (TEE);  Surgeon: Dorothy Spark, MD;  Location: [facility];  Service: Cardiovascular;  Laterality: N/A;  . [name]    . WRIST FRACTURE SURGERY       reports that she has never smoked. She has never used smokeless tobacco. She reports that she does not drink alcohol or use drugs.  Allergies  Allergen Reactions  .  Latex Itching and Rash    Family History  Problem Relation Age of Onset  . Diabetes Mother        died in her 62's of a stroke  . Cancer Mother   . Stroke Mother   . Breast cancer Mother   . Diabetes Father        died in his 34's of a stroke.  . Stroke Father   . Diabetes Brother        died @ 96 of a stroke.  . Stroke Brother   . Diabetes Maternal Grandmother   . Diabetes Maternal Grandfather   . Diabetes Paternal Grandmother   . Diabetes Paternal Grandfather   . Breast cancer Maternal Aunt    Prior to Admission medications   Medication Sig Start Date End Date Taking? Authorizing Provider  acetaminophen (TYLENOL) 500 MG tablet Take 1,500-2,000 mg by mouth every 6 (six) hours as needed for mild pain or headache.    [provider]  albuterol (PROVENTIL HFA;VENTOLIN HFA) 108 (90 Base) MCG/ACT inhaler Inhale 2 puffs into the lungs every 6 (six) hours as needed for wheezing or shortness of breath. 08/14/18   Kathie Dike, MD  apixaban (ELIQUIS) 5 MG TABS tablet Take 1 tablet (5 mg total) by mouth 2 (two) times daily. 07/21/18   Herminio Commons, MD  benzonatate (TESSALON PERLES) 100 MG capsule Take 1 capsule (100 mg total) by mouth every 6 (six) hours as needed for cough. 08/24/18 08/24/19  Manuella Ghazi, Pratik D, DO  buPROPion (WELLBUTRIN XL) 300 MG 24 hr tablet Take 300 mg by mouth daily. 07/06/13   [provider]  dextromethorphan-guaiFENesin (MUCINEX DM) 30-600 MG 12hr tablet Take 1 tablet by mouth 2 (two) times daily. 08/14/18   Kathie Dike, MD  dextromethorphan-guaiFENesin (MUCINEX DM) 30-600 MG 12hr tablet Take 1 tablet by mouth 2 (two) times daily for 10 days. 08/24/18 09/03/18  Manuella Ghazi, Pratik D, DO  diltiazem (CARDIZEM CD) 240 MG 24 hr capsule Take 1 capsule (240 mg total) by mouth daily. 08/14/18   Kathie Dike, MD  escitalopram (LEXAPRO) 5 MG tablet Take 1 tablet (5 mg total) by mouth daily. 08/25/18 09/24/18  Manuella Ghazi, Pratik D, DO  fluticasone (FLONASE) 50 MCG/ACT  nasal spray Place 2 sprays into both nostrils daily.    [provider]  furosemide (LASIX) 40 MG tablet Take 1 tablet (40 mg total) by mouth daily. 08/14/18   Kathie Dike, MD  glipiZIDE (GLUCOTROL) 10 MG tablet Take 10 mg by mouth 2 (two) times daily. 03/02/18   [provider]  levalbuterol Penne Lash) 0.63 MG/3ML nebulizer solution Take 3 mLs (0.63 mg total) by nebulization every 6 (six) hours as needed for wheezing or shortness of breath. 08/14/18   Kathie Dike, MD  loratadine (CLARITIN) 10 MG tablet Take 10 mg by mouth daily.    [provider]  metoprolol tartrate (LOPRESSOR) 50 MG tablet Take 1 tablet (50 mg total) by mouth 2 (two) times daily. 08/14/18   Kathie Dike, MD  pioglitazone (ACTOS) 45 MG tablet Take  45 mg by mouth daily. 05/17/18   [provider]  potassium chloride SA (K-DUR,KLOR-CON) 20 MEQ tablet Take 1 tablet (20 mEq total) by mouth daily. 07/21/18   Herminio Commons, MD  SPIRIVA RESPIMAT 1.25 MCG/ACT AERS Inhale 2 sprays into the lungs daily. 01/21/18   [provider]  traZODone (DESYREL) 50 MG tablet Take 0.5 tablets (25 mg total) by mouth at bedtime. 08/24/18 09/23/18  Heath Lark D, DO    Physical Exam: Vitals:   09/03/18 0030 09/03/18 0100 09/03/18 0158 09/03/18 0217  BP: (!) 90/49 (!) 96/55 104/64 99/64  Pulse:      Resp: 13 17 (!) 21 (!) 22  Temp:      TempSrc:      SpO2: 100% 100% 100% 100%  Weight:      Height:        Constitutional: NAD, calm, comfortable Eyes: PERRL, lids and conjunctivae normal ENMT: Mucous membranes are very dry. Posterior pharynx clear of any exudate or lesions. Neck: normal, supple, no masses, no thyromegaly Respiratory: Decreased breath sounds on bases, otherwise clear to auscultation bilaterally, no wheezing, no crackles. Normal respiratory effort. No accessory muscle use.  Cardiovascular: Regular rate and rhythm, no murmurs / rubs / gallops. No extremity edema. 2+ pedal pulses.  No carotid bruits.  Abdomen: Soft, mild epigastric tenderness, no guarding or rebound, no masses palpated. No hepatosplenomegaly. Bowel sounds positive.  Musculoskeletal: no clubbing / cyanosis.  Good ROM, no contractures. Normal muscle tone.  Skin: Her skin looks very dry and scaly.  Multiple areas of ecchymosis on forearms. Neurologic: CN 2-12 grossly intact. Sensation intact, DTR normal. Strength 5/5 in all 4.  Psychiatric: Normal judgment and insight. Alert and oriented x 3. Normal mood.   Labs on Admission: I have personally reviewed following labs and imaging studies  CBC: Recent Labs  Lab 09/02/18 2335  WBC 11.2*  NEUTROABS 9.4*  HGB 10.1*  HCT 35.2*  MCV 92.4  PLT 008   Basic Metabolic Panel: Recent Labs  Lab 09/02/18 2335  NA 135  K 3.4*  CL 97*  CO2 28  GLUCOSE 237*  BUN 37*  CREATININE 1.07*  CALCIUM 7.8*   GFR: Estimated Creatinine Clearance: 63.7 mL/min (A) (by C-G formula based on SCr of 1.07 mg/dL (H)). Liver Function Tests: Recent Labs  Lab 09/02/18 2335  AST 19  ALT 12  ALKPHOS 81  BILITOT 0.9  PROT 6.1*  ALBUMIN 2.7*   No results for input(s): LIPASE, AMYLASE in the last 168 hours. No results for input(s): AMMONIA in the last 168 hours. Coagulation Profile: Recent Labs  Lab 09/02/18 2335  INR 1.94   Cardiac Enzymes: No results for input(s): CKTOTAL, CKMB, CKMBINDEX, TROPONINI in the last 168 hours. BNP (last 3 results) No results for input(s): PROBNP in the last 8760 hours. HbA1C: No results for input(s): HGBA1C in the last 72 hours. CBG: No results for input(s): GLUCAP in the last 168 hours. Lipid Profile: No results for input(s): CHOL, HDL, LDLCALC, TRIG, CHOLHDL, LDLDIRECT in the last 72 hours. Thyroid Function Tests: No results for input(s): TSH, T4TOTAL, FREET4, T3FREE, THYROIDAB in the last 72 hours. Anemia Panel: No results for input(s): VITAMINB12, FOLATE, FERRITIN, TIBC, IRON, RETICCTPCT in the last 72 hours. Urine  analysis:    Component Value Date/Time   COLORURINE YELLOW 08/16/2018 2100   APPEARANCEUR CLEAR 08/16/2018 2100   LABSPEC 1.024 08/16/2018 2100   PHURINE 6.0 08/16/2018 2100   GLUCOSEU >=500 (A) 08/16/2018 2100   HGBUR  MODERATE (A) 08/16/2018 2100   BILIRUBINUR NEGATIVE 08/16/2018 2100   KETONESUR 5 (A) 08/16/2018 2100   PROTEINUR NEGATIVE 08/16/2018 2100   UROBILINOGEN 0.2 12/01/2007 1025   NITRITE NEGATIVE 08/16/2018 2100   LEUKOCYTESUR TRACE (A) 08/16/2018 2100    Radiological Exams on Admission: Ct Abdomen Pelvis W Contrast  Result Date: 09/03/2018 CLINICAL DATA:  68 year old female with left-sided abdominal pain and rectal bleeding. EXAM: CT ABDOMEN AND PELVIS WITH CONTRAST TECHNIQUE: Multidetector CT imaging of the abdomen and pelvis was performed using the standard protocol following bolus administration of intravenous contrast. CONTRAST:  137m ISOVUE-300 IOPAMIDOL (ISOVUE-300) INJECTION 61% COMPARISON:  Renal ultrasound dated 04/05/2018. FINDINGS: Lower chest: There is a small right pleural effusion with associated right lung base partial atelectasis. Pneumonia is not excluded. Clinical correlation is recommended. There is mild cardiomegaly. There is calcification of the mitral annulus. No intra-abdominal free air or free fluid. Hepatobiliary: The liver is unremarkable. Cholecystectomy. There is dilatation of the common bile duct measuring up to 19 mm. No calcified gallstone. Pancreas: The pancreas is poorly visualized and appears atrophic with fatty infiltration. 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 Further evaluation with MRI on a nonemergent basis recommended. Spleen: Normal in size without focal abnormality. Adrenals/Urinary Tract: The adrenal glands are unremarkable. Mild renal parenchyma atrophy and cortical irregularity and thinning.  Subcentimeter left renal hypodense lesions are too small to characterize. There is no hydronephrosis on either side. There is symmetric enhancement and excretion of contrast by both kidneys. The visualized ureters appear unremarkable. Minimal thickened appearance of the bladder wall, likely related to underdistention. Stomach/Bowel: Loose stool throughout the colon compatible with diarrheal state. There is diffuse enhancement of the colonic mucosa most consistent with colitis. There is sigmoid diverticulosis without active inflammatory changes. There is no bowel obstruction. The appendix is not identified with certainty and likely surgically absent. Vascular/Lymphatic: There is moderate aortoiliac atherosclerotic disease. No portal venous gas. There is no adenopathy. Reproductive: Hysterectomy. There is a 4.5 x 4.2 cm multicystic lesion in the right ovary. Further evaluation with ultrasound on a nonemergent basis recommended. Depending on the ultrasound finding additional evaluation with MRI the required. A 1.3 cm rounded nodular density in the left anterior pelvis (series 3, image 67) likely represents a lymph node. Other: Mild diffuse subcutaneous edema. No fluid collection. Musculoskeletal: Degenerative changes of the spine and osteopenia. Partially visualized left femoral fixation rod. No acute osseous pathology. Grade 1 L4-L5 anterolisthesis. IMPRESSION: 1. Diarrheal state with findings of colitis. Correlation with clinical exam and stool cultures recommended. No bowel obstruction. 2. Sigmoid diverticulosis without active inflammatory changes. 3. Cystic lesion in the region of the body of the pancreas. Further evaluation with MRI on a nonemergent basis recommended. 4. Right ovarian multicystic lesion. Further evaluation with pelvic ultrasound recommended. 5. Small right pleural effusion with associated right lung base partial atelectasis. Pneumonia is not excluded. Clinical correlation is recommended.  Electronically Signed   By: AAnner CreteM.D.   On: 09/03/2018 02:15    EKG: Independently reviewed.  Assessment/Plan Principal Problem:   GI bleed Observation/stepdown. Keep n.p.o. Gentle and time-limited IV fluids. Protonix 40 mg IVP every 24 hours. Monitor hematocrit and hemoglobin. Transfuse as needed. Consult gastroenterology for further evaluation.  Active Problems:   Hypokalemia Replacing. Follow-up potassium level. Check magnesium and phosphorus on next blood draw.  Diabetes type 2, uncontrolled (HCC) Currently n.p.o. CBG monitoring with sensitive RI sliding scale.    HTN (hypertension) Currently hypotensive. Hold furosemide, beta and calcium channel blockers Monitor blood pressure and resume antihypertensives if needed.    Depression Resume trazodone and bupropion once clear for oral intake.    Unspecified atrial fibrillation (HCC) CHA?DS?-VASc Score of at least 5. Hold Eliquis due to bleeding. Hold Lopressor and Cardizem due to hypotension.    Chronic diastolic CHF (congestive heart failure) (HCC) No signs of decompensation at this time. Actually appears volume depleted. Holding furosemide and metoprolol due to hypotension.    CKD (chronic kidney disease), stage III (HCC) Monitor renal function and electrolytes    COPD (chronic obstructive pulmonary disease) (HCC) Supplemental oxygen and bronchodilators as needed.     DVT prophylaxis: SCDs. Code Status: Full code. Family Communication: None at bedside. Disposition Plan: Observation for H&H monitoring and GI evaluation. Consults called: Routine gastroenterology consult. Admission status: Observation/stepdown.   Reubin Milan MD [facility]  09/03/2018, 3:17 AM    This document was prepared using Dragon voice recognition software and may contain some unintended transcription errors.

## 2018-09-03 NOTE — Consult Note (Addendum)
Referring Provider: [facility] Primary Care Physician:  [name], MD Primary Gastroenterologist:  Dr. Oneida Alar (previously unassigned)  Date of Admission: 09/02/2018 Date of Consultation: 09/03/2018  Reason for Consultation:  GI bleed  HPI:  [name] is a 68 y.o. female with a past medical history of atrial fibrillation, CHF, diabetes, hypertension, left ventricular thrombus and subsequent anticoagulation with Eliquis, CHF.  She fractured both of her legs and August 2019 in December 2019 and although she was released from the orthopedist she was sent to a rehab facility.  The rehab facility brought her to the ER yesterday with rectal bleeding.  Apparently yesterday she felt constipated and was given a laxative which resulted in a bowel movement with rectal bleeding.  She has never had rectal bleeding before.  She was also complaining of abdominal pain.  She did have some nausea and vomiting but this is when "something got stuck on my throat while I was eating."  While in the emergency department she did have apparently 2 bloody bowel movements.  Her blood pressure was soft in the 90s to low 100s and she was supported with IV fluids.  She was not tachycardic but is on a beta-blocker for atrial fibrillation.  CT scan completed found diarrheal state with findings of colitis but no obstruction.  Sigmoid diverticulosis without diverticulitis.  Cystic lesion in the pancreas recommended MRI follow-up on a nonemergent basis.  Right ovarian multicystic lesion and further evaluation recommended.  Previously completed diagnostic esophagus 08/23/2018 found nonspecific esophageal motility disorder.  Devious echocardiogram dated 04/05/2018 demonstrating a large thrombus measuring 18 x 12 mm in the left atrial apex.  Given soft blood pressures and atrial thrombus documented in August (hopefully resolved) the patient's Eliquis was reversed with Andexxa.  Her hemoglobin was stable at 10.1  (baseline 9 - 10).  An H&H drawn this morning found declined to 9.0.  Possible hydration effect although difficult to determine without full CBC.  Her FOBT was positive in the emergency department.  She was subsequently admitted and a GI consult was placed for patient with GI bleeding, anemia, abdominal pain.  The hospitalist has placed her on n.p.o. status and ordered Protonix twice daily.  Today she confirms the above information. Per nursing, she had 'a smear" of bright red/maroon blood this morning. Last food intake day before yesterday (applesauce). LLQ abdominal pain. Denies chest pain, dyspnea, weakness, fatigue, syncope. Has never had bleeding like this before. Last colonoscopy age 10, not sure where or by whom; thinks she may have had a couple polyps, but not sure. No other GI complaints at this time.  Past Medical History:  Diagnosis Date  . Atrial fibrillation ([place])   . CHF (congestive heart failure) ([place])   . Depression   . Diabetes mellitus without complication ([place])   . 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 Hypertension   . Morbid obesity ([place])   . Osteoporosis   . UTI (lower urinary tract infection) 01/2016   Cipro for Klebsiella pneumoniae isolate    Past Surgical History:  Procedure Laterality Date  . ABDOMINAL HYSTERECTOMY    . CESAREAN SECTION    . CHOLECYSTECTOMY    . FEMUR IM NAIL Left 02/20/2016  . FEMUR IM NAIL Left 02/20/2016   Procedure: INTRAMEDULLARY (IM) RETROGRADE FEMORAL NAILING;  Surgeon: Leandrew Koyanagi, MD;  Location: [place];  Service: Orthopedics;  Laterality: Left;  . PANCREAS SURGERY  1967   1 cyst excised and one cyst  drained  . TEE WITHOUT CARDIOVERSION N/A 04/05/2018   Procedure: TRANSESOPHAGEAL ECHOCARDIOGRAM (TEE);  Surgeon: Dorothy Spark, MD;  Location: [place];  Service: Cardiovascular;  Laterality: N/A;  . [place]    . WRIST FRACTURE SURGERY      Prior to Admission medications    Medication Sig Start Date End Date Taking? Authorizing Provider  acetaminophen (TYLENOL) 500 MG tablet Take 1,500-2,000 mg by mouth every 6 (six) hours as needed for mild pain or headache.    [provider]  albuterol (PROVENTIL HFA;VENTOLIN HFA) 108 (90 Base) MCG/ACT inhaler Inhale 2 puffs into the lungs every 6 (six) hours as needed for wheezing or shortness of breath. 08/14/18   Kathie Dike, MD  apixaban (ELIQUIS) 5 MG TABS tablet Take 1 tablet (5 mg total) by mouth 2 (two) times daily. 07/21/18   Herminio Commons, MD  benzonatate (TESSALON PERLES) 100 MG capsule Take 1 capsule (100 mg total) by mouth every 6 (six) hours as needed for cough. 08/24/18 08/24/19  Manuella Ghazi, Pratik D, DO  buPROPion (WELLBUTRIN XL) 300 MG 24 hr tablet Take 300 mg by mouth daily. 07/06/13   [provider]  dextromethorphan-guaiFENesin (MUCINEX DM) 30-600 MG 12hr tablet Take 1 tablet by mouth 2 (two) times daily. 08/14/18   Kathie Dike, MD  dextromethorphan-guaiFENesin (MUCINEX DM) 30-600 MG 12hr tablet Take 1 tablet by mouth 2 (two) times daily for 10 days. 08/24/18 09/03/18  Manuella Ghazi, Pratik D, DO  diltiazem (CARDIZEM CD) 240 MG 24 hr capsule Take 1 capsule (240 mg total) by mouth daily. 08/14/18   Kathie Dike, MD  escitalopram (LEXAPRO) 5 MG tablet Take 1 tablet (5 mg total) by mouth daily. 08/25/18 09/24/18  Manuella Ghazi, Pratik D, DO  fluticasone (FLONASE) 50 MCG/ACT nasal spray Place 2 sprays into both nostrils daily.    [provider]  furosemide (LASIX) 40 MG tablet Take 1 tablet (40 mg total) by mouth daily. 08/14/18   Kathie Dike, MD  glipiZIDE (GLUCOTROL) 10 MG tablet Take 10 mg by mouth 2 (two) times daily. 03/02/18   [provider]  levalbuterol Penne Lash) 0.63 MG/3ML nebulizer solution Take 3 mLs (0.63 mg total) by nebulization every 6 (six) hours as needed for wheezing or shortness of breath. 08/14/18   Kathie Dike, MD  loratadine (CLARITIN) 10 MG tablet Take 10 mg by mouth  daily.    [provider]  metoprolol tartrate (LOPRESSOR) 50 MG tablet Take 1 tablet (50 mg total) by mouth 2 (two) times daily. 08/14/18   Kathie Dike, MD  pioglitazone (ACTOS) 45 MG tablet Take 45 mg by mouth daily. 05/17/18   [provider]  potassium chloride SA (K-DUR,KLOR-CON) 20 MEQ tablet Take 1 tablet (20 mEq total) by mouth daily. 07/21/18   Herminio Commons, MD  SPIRIVA RESPIMAT 1.25 MCG/ACT AERS Inhale 2 sprays into the lungs daily. 01/21/18   [provider]  traZODone (DESYREL) 50 MG tablet Take 0.5 tablets (25 mg total) by mouth at bedtime. 08/24/18 09/23/18  Heath Lark D, DO    Current Facility-Administered Medications  Medication Dose Route Frequency Provider Last Rate Last Dose  . 0.9 % NaCl with KCl 40 mEq / L  infusion   Intravenous Continuous Reubin Milan, MD 88 mL/hr at 09/03/18 0647 88 mL/hr at 09/03/18 0647  . acetaminophen (TYLENOL) tablet 650 mg  650 mg Oral Q6H PRN Reubin Milan, MD       Or  . acetaminophen (TYLENOL) suppository 650 mg  650 mg Rectal Q6H PRN Reubin Milan, MD      . fentaNYL (SUBLIMAZE) injection 12.5 mcg  12.5 mcg Intravenous Q2H PRN Reubin Milan, MD   12.5 mcg at 09/03/18 6659  . ondansetron (ZOFRAN) tablet 4 mg  4 mg Oral Q6H PRN Reubin Milan, MD       Or  . ondansetron [facility]) injection 4 mg  4 mg Intravenous Q6H PRN Reubin Milan, MD   4 mg at 09/03/18 0346  . pantoprazole (PROTONIX) injection 40 mg  40 mg Intravenous Q24H Reubin Milan, MD   40 mg at 09/03/18 0345   Current Outpatient Medications  Medication Sig Dispense Refill  . acetaminophen (TYLENOL) 500 MG tablet Take 1,500-2,000 mg by mouth every 6 (six) hours as needed for mild pain or headache.    . albuterol (PROVENTIL HFA;VENTOLIN HFA) 108 (90 Base) MCG/ACT inhaler Inhale 2 puffs into the lungs every 6 (six) hours as needed for wheezing or shortness of breath. 1 Inhaler 2  . apixaban (ELIQUIS) 5 MG TABS  tablet Take 1 tablet (5 mg total) by mouth 2 (two) times daily. 60 tablet 3  . benzonatate (TESSALON PERLES) 100 MG capsule Take 1 capsule (100 mg total) by mouth every 6 (six) hours as needed for cough. 30 capsule 0  . buPROPion (WELLBUTRIN XL) 300 MG 24 hr tablet Take 300 mg by mouth daily.    Marland Kitchen dextromethorphan-guaiFENesin (MUCINEX DM) 30-600 MG 12hr tablet Take 1 tablet by mouth 2 (two) times daily. 30 tablet 0  . dextromethorphan-guaiFENesin (MUCINEX DM) 30-600 MG 12hr tablet Take 1 tablet by mouth 2 (two) times daily for 10 days. 20 tablet 0  . diltiazem (CARDIZEM CD) 240 MG 24 hr capsule Take 1 capsule (240 mg total) by mouth daily. 30 capsule 1  . escitalopram (LEXAPRO) 5 MG tablet Take 1 tablet (5 mg total) by mouth daily. 30 tablet 0  . fluticasone (FLONASE) 50 MCG/ACT nasal spray Place 2 sprays into both nostrils daily.    . furosemide (LASIX) 40 MG tablet Take 1 tablet (40 mg total) by mouth daily. 30 tablet 1  . glipiZIDE (GLUCOTROL) 10 MG tablet Take 10 mg by mouth 2 (two) times daily.  2  . levalbuterol (XOPENEX) 0.63 MG/3ML nebulizer solution Take 3 mLs (0.63 mg total) by nebulization every 6 (six) hours as needed for wheezing or shortness of breath. 3 mL 12  . loratadine (CLARITIN) 10 MG tablet Take 10 mg by mouth daily.    . metoprolol tartrate (LOPRESSOR) 50 MG tablet Take 1 tablet (50 mg total) by mouth 2 (two) times daily. 60 tablet 0  . pioglitazone (ACTOS) 45 MG tablet Take 45 mg by mouth daily.  2  . potassium chloride SA (K-DUR,KLOR-CON) 20 MEQ tablet Take 1 tablet (20 mEq total) by mouth daily. 30 tablet 11  . SPIRIVA RESPIMAT 1.25 MCG/ACT AERS Inhale 2 sprays into the lungs daily.  2  . traZODone (DESYREL) 50 MG tablet Take 0.5 tablets (25 mg total) by mouth at bedtime. 15 tablet 0    Allergies as of 09/02/2018 - Review Complete 09/02/2018  Allergen Reaction Noted  . Latex Itching and Rash 04/01/2018    Family History  Problem Relation Age of Onset  . Diabetes  Mother        died in her 80's of a stroke  . Cancer Mother   . Stroke Mother   . Breast cancer Mother   . Diabetes Father  died in his 68's of a stroke.  . Stroke Father   . Diabetes Brother        died @ 68 of a stroke.  . Stroke Brother   . Diabetes Maternal Grandmother   . Diabetes Maternal Grandfather   . Diabetes Paternal Grandmother   . Diabetes Paternal Grandfather   . Breast cancer Maternal Aunt     Social History   Socioeconomic History  . Marital status: Married    Spouse name: Not on file  . Number of children: Not on file  . Years of education: Not on file  . Highest education level: Not on file  Occupational History  . Occupation: retired  Scientific laboratory technician  . Financial resource strain: Not on file  . Food insecurity:    Worry: Not on file    Inability: Not on file  . Transportation needs:    Medical: Not on file    Non-medical: Not on file  Tobacco Use  . Smoking status: Never Smoker  . Smokeless tobacco: Never Used  Substance and Sexual Activity  . Alcohol use: No  . Drug use: No  . Sexual activity: Not on file  Lifestyle  . Physical activity:    Days per week: Not on file    Minutes per session: Not on file  . Stress: Not on file  Relationships  . Social connections:    Talks on phone: Not on file    Gets together: Not on file    Attends religious service: Not on file    Active member of club or organization: Not on file    Attends meetings of clubs or organizations: Not on file    Relationship status: Not on file  . Intimate partner violence:    Fear of current or ex partner: Not on file    Emotionally abused: Not on file    Physically abused: Not on file    Forced sexual activity: Not on file  Other Topics Concern  . Not on file  Social History Narrative   Lives in [place] with husband.  More or less w/c bound since leg fx about 3 yrs ago.    Review of Systems: General: Negative for anorexia, weight loss, fever, chills,  fatigue, weakness.  ENT: Negative for hoarseness. Some mild dysphagia. CV: Negative for chest pain, angina, palpitations, peripheral edema.  Respiratory: Negative for dyspnea at rest,  cough, sputum, wheezing.  GI: See history of present illness. MS: Negative for joint pain, low back pain.  Derm: Negative for rash or itching.  Endo: Negative for unusual weight change.  Heme: Negative for bruising or bleeding. Allergy: Negative for rash or hives.  Physical Exam: Vital signs in last 24 hours: Temp:  [97.5 F (36.4 C)] 97.5 F (36.4 C) (01/16 2257) Pulse Rate:  [65-86] 80 (01/17 0608) Resp:  [13-27] 18 (01/17 0608) BP: (90-133)/(49-104) 106/66 (01/17 0600) SpO2:  [89 %-100 %] 97 % (01/17 0608) Weight:  [115.7 kg] 115.7 kg (01/16 2256)   General:   Alert,  Well-developed, well-nourished, pleasant and cooperative in NAD Head:  Normocephalic and atraumatic. Eyes:  Sclera clear, no icterus. Conjunctiva pink. Ears:  Normal auditory acuity. Neck:  Supple; no masses or thyromegaly. Lungs:  Clear throughout to auscultation. No wheezes, crackles, or rhonchi. No acute distress. Occasional non-productive cough. Heart:  Regular rate and rhythm; no murmurs, clicks, rubs,  or gallops. Abdomen:  Soft, nontender and nondistended. No masses, hepatosplenomegaly or hernias noted. Normal bowel sounds, without guarding,  and without rebound.   Rectal:  Deferred.   Msk:  Symmetrical without gross deformities. Pulses:  Normal bilateral DP pulses noted. Extremities:  Without clubbing or edema. Neurologic:  Alert and  oriented x4;  grossly normal neurologically. Skin:  Intact without significant lesions or rashes. Psych:  Alert and cooperative. Normal mood and affect.  Intake/Output from previous day: 01/16 0701 - 01/17 0700 In: 2200 [IV Piggyback:2200] Out: -  Intake/Output this shift: No intake/output data recorded.  Lab Results: Recent Labs    09/02/18 2335 09/03/18 0427  WBC 11.2*  --   HGB  10.1* 9.0*  HCT 35.2* 31.4*  PLT 326  --    BMET Recent Labs    09/02/18 2335  NA 135  K 3.4*  CL 97*  CO2 28  GLUCOSE 237*  BUN 37*  CREATININE 1.07*  CALCIUM 7.8*   LFT Recent Labs    09/02/18 2335  PROT 6.1*  ALBUMIN 2.7*  AST 19  ALT 12  ALKPHOS 81  BILITOT 0.9   PT/INR Recent Labs    09/02/18 2335  LABPROT 21.9*  INR 1.94   Hepatitis Panel No results for input(s): HEPBSAG, HCVAB, HEPAIGM, HEPBIGM in the last 72 hours. C-Diff No results for input(s): CDIFFTOX in the last 72 hours.  Studies/Results: Ct Abdomen Pelvis W Contrast  Result Date: 09/03/2018 CLINICAL DATA:  68 year old female with left-sided abdominal pain and rectal bleeding. EXAM: CT ABDOMEN AND PELVIS WITH CONTRAST TECHNIQUE: Multidetector CT imaging of the abdomen and pelvis was performed using the standard protocol following bolus administration of intravenous contrast. CONTRAST:  123m ISOVUE-300 IOPAMIDOL (ISOVUE-300) INJECTION 61% COMPARISON:  Renal ultrasound dated 04/05/2018. FINDINGS: Lower chest: There is a small right pleural effusion with associated right lung base partial atelectasis. Pneumonia is not excluded. Clinical correlation is recommended. There is mild cardiomegaly. There is calcification of the mitral annulus. No intra-abdominal free air or free fluid. Hepatobiliary: The liver is unremarkable. Cholecystectomy. There is dilatation of the common bile duct measuring up to 19 mm. No calcified gallstone. Pancreas: The pancreas is poorly visualized and appears atrophic with fatty infiltration. 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 Further evaluation with MRI on a nonemergent basis recommended. Spleen: Normal in size without focal abnormality. Adrenals/Urinary Tract: The adrenal glands are unremarkable. Mild renal parenchyma atrophy and cortical  irregularity and thinning. Subcentimeter left renal hypodense lesions are too small to characterize. There is no hydronephrosis on either side. There is symmetric enhancement and excretion of contrast by both kidneys. The visualized ureters appear unremarkable. Minimal thickened appearance of the bladder wall, likely related to underdistention. Stomach/Bowel: Loose stool throughout the colon compatible with diarrheal state. There is diffuse enhancement of the colonic mucosa most consistent with colitis. There is sigmoid diverticulosis without active inflammatory changes. There is no bowel obstruction. The appendix is not identified with certainty and likely surgically absent. Vascular/Lymphatic: There is moderate aortoiliac atherosclerotic disease. No portal venous gas. There is no adenopathy. Reproductive: Hysterectomy. There is a 4.5 x 4.2 cm multicystic lesion in the right ovary. Further evaluation with ultrasound on a nonemergent basis recommended. Depending on the ultrasound finding additional evaluation with MRI the required. A 1.3 cm rounded nodular density in the left anterior pelvis (series 3, image 67) likely represents a lymph node. Other: Mild diffuse subcutaneous edema. No fluid collection. Musculoskeletal: Degenerative  changes of the spine and osteopenia. Partially visualized left femoral fixation rod. No acute osseous pathology. Grade 1 L4-L5 anterolisthesis. IMPRESSION: 1. Diarrheal state with findings of colitis. Correlation with clinical exam and stool cultures recommended. No bowel obstruction. 2. Sigmoid diverticulosis without active inflammatory changes. 3. Cystic lesion in the region of the body of the pancreas. Further evaluation with MRI on a nonemergent basis recommended. 4. Right ovarian multicystic lesion. Further evaluation with pelvic ultrasound recommended. 5. Small right pleural effusion with associated right lung base partial atelectasis. Pneumonia is not excluded. Clinical  correlation is recommended. Electronically Signed   By: Anner Crete M.D.   On: 09/03/2018 02:15    Impression: Pleasant 68 year old female with multiple comorbidities including CHF, atrial thrombus on Eliquis, diabetes, hypertension.  She presented from rehab facility where she has been staying since bilateral leg fractures in August and December 2019.  They noted 2-4 episodes of rectal bleeding.  She had further rectal bleeding in the emergency department and had "a smear" of red/maroon blood this morning.  Her baseline hemoglobin seems to be about 9.5.  When she came in the emergency room she was mildly hypotensive, not tachycardic although at "home "she is on antihypertensives.  These have been held since her admission.  Her initial hemoglobin was 10.1.  Overnight this dropped to 9.0.  She does not have overt symptoms of significant anemia.  Her blood pressure does remain soft today between 96 and 592 systolic while I was in the room.  Additionally the patient had diarrhea state and colitis on CT suggestive of infectious colitis. She has been started on antibiotics for this. No overt diarrhea per the patient.  This week and we do not have GI coverage.  I discussed the case with Dr. Roderic Palau.  We agreed to closely monitor the patient and transfuse as necessary.  If the patient does have further GI bleeding while here she would need to be transferred to [facility] for consideration of endoscopic evaluation.  Plan: 1. Check CBC this morning 2. Monitor hgb closely 3. Monitor for any further GI bleed 4. May need transfer to [facility] if endoscopic evaluation is needed 5. Transfuse as necessary 6. Continue antibiotics 7. Supportive measures  **PLEASE NOTE: There will be no GI coverage at [facility] from 5pm today (09/03/2018) until 7am Monday (09/06/2018).**  Thank you for allowing [place] to participate in the care of [place], DNP, AGNP-C Adult & Gerontological Nurse Practitioner [facility]    LOS: 0 days     09/03/2018, 8:02 AM

## 2018-09-03 NOTE — ED Notes (Signed)
Patient had 2 large bowel movements with bloody stool. Patient had total linen and gown change x 2.

## 2018-09-04 ENCOUNTER — Observation Stay (HOSPITAL_BASED_OUTPATIENT_CLINIC_OR_DEPARTMENT_OTHER): Payer: BLUE CROSS/BLUE SHIELD

## 2018-09-04 DIAGNOSIS — I4891 Unspecified atrial fibrillation: Secondary | ICD-10-CM | POA: Diagnosis not present

## 2018-09-04 DIAGNOSIS — K922 Gastrointestinal hemorrhage, unspecified: Secondary | ICD-10-CM | POA: Diagnosis not present

## 2018-09-04 DIAGNOSIS — N183 Chronic kidney disease, stage 3 (moderate): Secondary | ICD-10-CM | POA: Diagnosis not present

## 2018-09-04 DIAGNOSIS — I5032 Chronic diastolic (congestive) heart failure: Secondary | ICD-10-CM | POA: Diagnosis not present

## 2018-09-04 DIAGNOSIS — K529 Noninfective gastroenteritis and colitis, unspecified: Secondary | ICD-10-CM | POA: Diagnosis not present

## 2018-09-04 LAB — ECHOCARDIOGRAM LIMITED
Height: 64 in
Weight: 3664.93 oz

## 2018-09-04 LAB — BASIC METABOLIC PANEL
ANION GAP: 10 (ref 5–15)
BUN: 30 mg/dL — ABNORMAL HIGH (ref 8–23)
CO2: 23 mmol/L (ref 22–32)
Calcium: 7.6 mg/dL — ABNORMAL LOW (ref 8.9–10.3)
Chloride: 104 mmol/L (ref 98–111)
Creatinine, Ser: 1.03 mg/dL — ABNORMAL HIGH (ref 0.44–1.00)
GFR calc Af Amer: >60 mL/min (ref >60)
GFR calc non Af Amer: 56 mL/min — ABNORMAL LOW (ref >60)
Glucose, Bld: 171 mg/dL — ABNORMAL HIGH (ref 70–99)
Potassium: 3.6 mmol/L (ref 3.5–5.1)
Sodium: 137 mmol/L (ref 135–145)

## 2018-09-04 LAB — GLUCOSE, CAPILLARY
GLUCOSE-CAPILLARY: 186 mg/dL — AB (ref 70–99)
Glucose-Capillary: 265 mg/dL — ABNORMAL HIGH (ref 70–99)
Glucose-Capillary: 171 mg/dL — ABNORMAL HIGH (ref 70–99)
Glucose-Capillary: 139 mg/dL — ABNORMAL HIGH (ref 70–99)

## 2018-09-04 LAB — CBC WITH DIFFERENTIAL/PLATELET
Abs Immature Granulocytes: 0.04 10*3/uL (ref 0.00–0.07)
Basophils Absolute: 0 10*3/uL (ref 0.0–0.1)
Basophils Relative: 0 %
Eosinophils Absolute: 0.2 10*3/uL (ref 0.0–0.5)
Eosinophils Relative: 3 %
HEMATOCRIT: 31.3 % — AB (ref 36.0–46.0)
Hemoglobin: 9.2 g/dL — ABNORMAL LOW (ref 12.0–15.0)
Immature Granulocytes: 1 %
LYMPHS ABS: 1.5 10*3/uL (ref 0.7–4.0)
LYMPHS PCT: 20 %
MCH: 27.8 pg (ref 26.0–34.0)
MCHC: 29.4 g/dL — ABNORMAL LOW (ref 30.0–36.0)
MCV: 94.6 fL (ref 80.0–100.0)
MONOS PCT: 8 %
Monocytes Absolute: 0.6 10*3/uL (ref 0.1–1.0)
Neutro Abs: 5.1 10*3/uL (ref 1.7–7.7)
Neutrophils Relative %: 68 %
Platelets: 232 10*3/uL (ref 150–400)
RBC: 3.31 MIL/uL — ABNORMAL LOW (ref 3.87–5.11)
RDW: 15.9 % — ABNORMAL HIGH (ref 11.5–15.5)
WBC: 7.6 10*3/uL (ref 4.0–10.5)
nRBC: 0 % (ref 0.0–0.2)

## 2018-09-04 MED ORDER — PANTOPRAZOLE SODIUM 40 MG IV SOLR
40.0000 mg | Freq: Two times a day (BID) | INTRAVENOUS | Status: DC
  Administered 2018-09-04 – 2018-09-05 (×4): 40 mg via INTRAVENOUS
  Filled 2018-09-04 (×5): qty 40

## 2018-09-04 MED ORDER — DICYCLOMINE HCL 10 MG PO CAPS
10.0000 mg | ORAL_CAPSULE | Freq: Three times a day (TID) | ORAL | Status: DC
  Administered 2018-09-04 – 2018-09-05 (×3): 10 mg via ORAL
  Filled 2018-09-04 (×3): qty 1

## 2018-09-04 NOTE — Progress Notes (Signed)
PROGRESS NOTE    [name]  IZT:[id number] DOB: 1951/05/09 DOA: 09/02/2018 PCP: [name], MD    Brief Narrative:  68 year old female admitted to the hospital with abdominal pain and bloody stools.  CT scan showed evidence of colitis.  She is chronically on Eliquis which was reversed in the emergency room.  She was started on intravenous antibiotics.  She is currently being observed and hemoglobin has been stable.  She was seen by gastroenterology with plans for possible endoscopic evaluation on 1/20.   Assessment & Plan:   Principal Problem:   GI bleed Active Problems:   Diabetes type 2, uncontrolled (HCC)   HTN (hypertension)   Depression   Unspecified atrial fibrillation (HCC)   Chronic diastolic CHF (congestive heart failure) (HCC)   CKD (chronic kidney disease), stage III (HCC)   Hypokalemia   COPD (chronic obstructive pulmonary disease) (HCC)   Coagulopathy (HCC)   Colitis   Lower abdominal pain   Rectal bleeding   1. Rectal bleeding.  Felt to be related to colitis in the setting of anticoagulation.  Hemoglobin has been stable.  Seen by GI and will likely plan on endoscopic evaluation on 1/20.  Continue to follow hemoglobin and treat supportively. 2. Colitis.  Currently on intravenous antibiotics.  Continues to have some abdominal pain.  No vomiting.  Will advance diet. 3. Atrial fibrillation.  Was previously on Eliquis, which was reversed in the emergency room due to GI bleeding.  Heart rate is currently stable. 4. 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  Will likely need to restart anticoagulation when stable from GI standpoint.  If this not feasible from a GI standpoint, may need to repeat TEE to evaluate if clot is still persistent. 5. Diabetes.  Initially had hypoglycemia on admission.  This is since improved.  Continue to follow. 6. Hypertension.  Blood pressures have been labile, but running on the low side.  Beta-blockers and calcium  beta-blockers currently on hold. 7. Chronic diastolic heart failure.  Appears compensated.  Continue to monitor. 8. Depression.  Continue on trazodone and bupropion. 9. COPD with chronic respiratory failure.  Continue on supplemental oxygen and bronchodilators as needed.  No wheezing at this time.   DVT prophylaxis: SCDs Code Status: Full code Family Communication: Discussed with patient, no family present Disposition Plan: Discharge back to nursing facility once bleeding issues have been resolved.   Consultants:   Gastroenterology  Procedures:     Antimicrobials:   Ceftriaxone 1/17 >  Flagyl 1/17 >   Subjective: Continues to complain of some abdominal cramping.  Stools are dark, but not red.  No vomiting.  Objective: Vitals:   09/04/18 1000 09/04/18 1100 09/04/18 1109 09/04/18 1613  BP: (!) 124/59 (!) 98/49    Pulse:  76  84  Resp: (!) 21 (!) 21 15 12   Temp:   97.8 F (36.6 C) (!) 97.5 F (36.4 C)  TempSrc:   Oral Oral  SpO2:  100%  100%  Weight:      Height:        Intake/Output Summary (Last 24 hours) at 09/04/2018 1852 Last data filed at 09/04/2018 1600 Gross per 24 hour  Intake 564.37 ml  Output -  Net 564.37 ml   Filed Weights   09/02/18 2256 09/03/18 0810 09/04/18 0519  Weight: 115.7 kg 103.4 kg 103.9 kg    Examination:  General exam: Appears calm and comfortable  Respiratory system: Bilateral rhonchi. Respiratory effort normal. Cardiovascular system: Irregularly irregular. No  JVD, murmurs, rubs, gallops or clicks. No pedal edema. Gastrointestinal system: Abdomen is nondistended, soft and nontender. No organomegaly or masses felt. Normal bowel sounds heard. Central nervous system: Alert and oriented. No focal neurological deficits. Extremities: Symmetric 5 x 5 power. Skin: No rashes, lesions or ulcers Psychiatry: Judgement and insight appear normal. Mood & affect appropriate.     Data Reviewed: I have personally reviewed following labs and  imaging studies  CBC: Recent Labs  Lab 09/02/18 2335 09/03/18 0427 09/03/18 1054 09/03/18 1601 09/03/18 2132 09/04/18 0433  WBC 11.2*  --  10.7*  --  8.2 7.6  NEUTROABS 9.4*  --  7.6  --   --  5.1  HGB 10.1* 9.0* 10.0* 8.8* 9.0* 9.2*  HCT 35.2* 31.4* 34.5* 30.8* 30.9* 31.3*  MCV 92.4  --  93.2  --  93.1 94.6  PLT 326  --  313  --  313 240   Basic Metabolic Panel: Recent Labs  Lab 09/02/18 2335 09/03/18 1054 09/04/18 0433  NA 135 138 137  K 3.4* 3.6 3.6  CL 97* 100 104  CO2 28 26 23   GLUCOSE 237* 91 171*  BUN 37* 34* 30*  CREATININE 1.07* 1.08* 1.03*  CALCIUM 7.8* 7.6* 7.6*   GFR: Estimated Creatinine Clearance: 62.3 mL/min (A) (by C-G formula based on SCr of 1.03 mg/dL (H)). Liver Function Tests: Recent Labs  Lab 09/02/18 2335  AST 19  ALT 12  ALKPHOS 81  BILITOT 0.9  PROT 6.1*  ALBUMIN 2.7*   No results for input(s): LIPASE, AMYLASE in the last 168 hours. No results for input(s): AMMONIA in the last 168 hours. Coagulation Profile: Recent Labs  Lab 09/02/18 2335  INR 1.94   Cardiac Enzymes: No results for input(s): CKTOTAL, CKMB, CKMBINDEX, TROPONINI in the last 168 hours. BNP (last 3 results) No results for input(s): PROBNP in the last 8760 hours. HbA1C: No results for input(s): HGBA1C in the last 72 hours. CBG: Recent Labs  Lab 09/03/18 1556 09/03/18 2135 09/04/18 0737 09/04/18 1109 09/04/18 1614  GLUCAP 84 129* 186* 265* 171*   Lipid Profile: No results for input(s): CHOL, HDL, LDLCALC, TRIG, CHOLHDL, LDLDIRECT in the last 72 hours. Thyroid Function Tests: No results for input(s): TSH, T4TOTAL, FREET4, T3FREE, THYROIDAB in the last 72 hours. Anemia Panel: No results for input(s): VITAMINB12, FOLATE, FERRITIN, TIBC, IRON, RETICCTPCT in the last 72 hours. Sepsis Labs: No results for input(s): PROCALCITON, LATICACIDVEN in the last 168 hours.  No results found for this or any previous visit (from the past 240 hour(s)).        Radiology Studies: Ct Abdomen Pelvis W Contrast  Result Date: 09/03/2018 CLINICAL DATA:  68 year old female with left-sided abdominal pain and rectal bleeding. EXAM: CT ABDOMEN AND PELVIS WITH CONTRAST TECHNIQUE: Multidetector CT imaging of the abdomen and pelvis was performed using the standard protocol following bolus administration of intravenous contrast. CONTRAST:  195m ISOVUE-300 IOPAMIDOL (ISOVUE-300) INJECTION 61% COMPARISON:  Renal ultrasound dated 04/05/2018. FINDINGS: Lower chest: There is a small right pleural effusion with associated right lung base partial atelectasis. Pneumonia is not excluded. Clinical correlation is recommended. There is mild cardiomegaly. There is calcification of the mitral annulus. No intra-abdominal free air or free fluid. Hepatobiliary: The liver is unremarkable. Cholecystectomy. There is dilatation of the common bile duct measuring up to 19 mm. No calcified gallstone. Pancreas: The pancreas is poorly visualized and appears atrophic with fatty infiltration. 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 Further evaluation with MRI on a nonemergent basis recommended. Spleen: Normal in size without focal abnormality. Adrenals/Urinary Tract: The adrenal glands are unremarkable. Mild renal parenchyma atrophy and cortical irregularity and thinning. Subcentimeter left renal hypodense lesions are too small to characterize. There is no hydronephrosis on either side. There is symmetric enhancement and excretion of contrast by both kidneys. The visualized ureters appear unremarkable. Minimal thickened appearance of the bladder wall, likely related to underdistention. Stomach/Bowel: Loose stool throughout the colon compatible with diarrheal state. There is diffuse enhancement of the colonic mucosa most consistent with colitis. There is  sigmoid diverticulosis without active inflammatory changes. There is no bowel obstruction. The appendix is not identified with certainty and likely surgically absent. Vascular/Lymphatic: There is moderate aortoiliac atherosclerotic disease. No portal venous gas. There is no adenopathy. Reproductive: Hysterectomy. There is a 4.5 x 4.2 cm multicystic lesion in the right ovary. Further evaluation with ultrasound on a nonemergent basis recommended. Depending on the ultrasound finding additional evaluation with MRI the required. A 1.3 cm rounded nodular density in the left anterior pelvis (series 3, image 67) likely represents a lymph node. Other: Mild diffuse subcutaneous edema. No fluid collection. Musculoskeletal: Degenerative changes of the spine and osteopenia. Partially visualized left femoral fixation rod. No acute osseous pathology. Grade 1 L4-L5 anterolisthesis. IMPRESSION: 1. Diarrheal state with findings of colitis. Correlation with clinical exam and stool cultures recommended. No bowel obstruction. 2. Sigmoid diverticulosis without active inflammatory changes. 3. Cystic lesion in the region of the body of the pancreas. Further evaluation with MRI on a nonemergent basis recommended. 4. Right ovarian multicystic lesion. Further evaluation with pelvic ultrasound recommended. 5. Small right pleural effusion with associated right lung base partial atelectasis. Pneumonia is not excluded. Clinical correlation is recommended. Electronically Signed   By: Anner Crete M.D.   On: 09/03/2018 02:15        Scheduled Meds: . chlorhexidine  15 mL Mouth Rinse BID  . dicyclomine  10 mg Oral TID AC  . insulin aspart  0-9 Units Subcutaneous TID WC  . mouth rinse  15 mL Mouth Rinse q12n4p  . pantoprazole (PROTONIX) IV  40 mg Intravenous Q12H  . umeclidinium bromide  1 puff Inhalation Daily   Continuous Infusions: . cefTRIAXone (ROCEPHIN)  IV Stopped (09/04/18 0524)  . metronidazole 500 mg (09/04/18 1827)      LOS: 0 days    Time spent: 69mns    JKathie Dike MD [facility]   If 7PM-7AM, please contact night-coverage [email or web address]  09/04/2018, 6:52 PM

## 2018-09-04 NOTE — Progress Notes (Signed)
*  PRELIMINARY RESULTS* Echocardiogram Limited 2-D Echocardiogram has been performed with [facility].  Samuel Germany 09/04/2018, 1:16 PM

## 2018-09-05 DIAGNOSIS — I130 Hypertensive heart and chronic kidney disease with heart failure and stage 1 through stage 4 chronic kidney disease, or unspecified chronic kidney disease: Secondary | ICD-10-CM | POA: Diagnosis present

## 2018-09-05 DIAGNOSIS — I482 Chronic atrial fibrillation, unspecified: Secondary | ICD-10-CM | POA: Diagnosis present

## 2018-09-05 DIAGNOSIS — J9610 Chronic respiratory failure, unspecified whether with hypoxia or hypercapnia: Secondary | ICD-10-CM | POA: Diagnosis present

## 2018-09-05 DIAGNOSIS — E042 Nontoxic multinodular goiter: Secondary | ICD-10-CM | POA: Diagnosis not present

## 2018-09-05 DIAGNOSIS — I513 Intracardiac thrombosis, not elsewhere classified: Secondary | ICD-10-CM | POA: Diagnosis present

## 2018-09-05 DIAGNOSIS — K529 Noninfective gastroenteritis and colitis, unspecified: Secondary | ICD-10-CM | POA: Diagnosis not present

## 2018-09-05 DIAGNOSIS — D500 Iron deficiency anemia secondary to blood loss (chronic): Secondary | ICD-10-CM | POA: Diagnosis present

## 2018-09-05 DIAGNOSIS — N83201 Unspecified ovarian cyst, right side: Secondary | ICD-10-CM | POA: Diagnosis not present

## 2018-09-05 DIAGNOSIS — E876 Hypokalemia: Secondary | ICD-10-CM | POA: Diagnosis present

## 2018-09-05 DIAGNOSIS — R935 Abnormal findings on diagnostic imaging of other abdominal regions, including retroperitoneum: Secondary | ICD-10-CM | POA: Diagnosis not present

## 2018-09-05 DIAGNOSIS — M810 Age-related osteoporosis without current pathological fracture: Secondary | ICD-10-CM | POA: Diagnosis present

## 2018-09-05 DIAGNOSIS — E861 Hypovolemia: Secondary | ICD-10-CM | POA: Diagnosis present

## 2018-09-05 DIAGNOSIS — R1310 Dysphagia, unspecified: Secondary | ICD-10-CM | POA: Diagnosis not present

## 2018-09-05 DIAGNOSIS — Z6841 Body Mass Index (BMI) 40.0 and over, adult: Secondary | ICD-10-CM | POA: Diagnosis not present

## 2018-09-05 DIAGNOSIS — I9589 Other hypotension: Secondary | ICD-10-CM | POA: Diagnosis present

## 2018-09-05 DIAGNOSIS — K922 Gastrointestinal hemorrhage, unspecified: Secondary | ICD-10-CM | POA: Diagnosis not present

## 2018-09-05 DIAGNOSIS — A09 Infectious gastroenteritis and colitis, unspecified: Secondary | ICD-10-CM | POA: Diagnosis present

## 2018-09-05 DIAGNOSIS — K222 Esophageal obstruction: Secondary | ICD-10-CM | POA: Diagnosis not present

## 2018-09-05 DIAGNOSIS — K862 Cyst of pancreas: Secondary | ICD-10-CM | POA: Diagnosis present

## 2018-09-05 DIAGNOSIS — K5710 Diverticulosis of small intestine without perforation or abscess without bleeding: Secondary | ICD-10-CM | POA: Diagnosis not present

## 2018-09-05 DIAGNOSIS — S7292XA Unspecified fracture of left femur, initial encounter for closed fracture: Secondary | ICD-10-CM | POA: Diagnosis not present

## 2018-09-05 DIAGNOSIS — I5032 Chronic diastolic (congestive) heart failure: Secondary | ICD-10-CM | POA: Diagnosis not present

## 2018-09-05 DIAGNOSIS — K625 Hemorrhage of anus and rectum: Secondary | ICD-10-CM | POA: Diagnosis not present

## 2018-09-05 DIAGNOSIS — E1122 Type 2 diabetes mellitus with diabetic chronic kidney disease: Secondary | ICD-10-CM | POA: Diagnosis present

## 2018-09-05 DIAGNOSIS — R531 Weakness: Secondary | ICD-10-CM | POA: Diagnosis not present

## 2018-09-05 DIAGNOSIS — D689 Coagulation defect, unspecified: Secondary | ICD-10-CM | POA: Diagnosis not present

## 2018-09-05 DIAGNOSIS — K869 Disease of pancreas, unspecified: Secondary | ICD-10-CM | POA: Diagnosis not present

## 2018-09-05 DIAGNOSIS — R5381 Other malaise: Secondary | ICD-10-CM | POA: Diagnosis not present

## 2018-09-05 DIAGNOSIS — F329 Major depressive disorder, single episode, unspecified: Secondary | ICD-10-CM | POA: Diagnosis present

## 2018-09-05 DIAGNOSIS — J449 Chronic obstructive pulmonary disease, unspecified: Secondary | ICD-10-CM | POA: Diagnosis present

## 2018-09-05 DIAGNOSIS — T39395A Adverse effect of other nonsteroidal anti-inflammatory drugs [NSAID], initial encounter: Secondary | ICD-10-CM | POA: Diagnosis present

## 2018-09-05 DIAGNOSIS — Z7901 Long term (current) use of anticoagulants: Secondary | ICD-10-CM | POA: Diagnosis not present

## 2018-09-05 DIAGNOSIS — N183 Chronic kidney disease, stage 3 (moderate): Secondary | ICD-10-CM | POA: Diagnosis not present

## 2018-09-05 DIAGNOSIS — K2970 Gastritis, unspecified, without bleeding: Secondary | ICD-10-CM | POA: Diagnosis not present

## 2018-09-05 DIAGNOSIS — Z7401 Bed confinement status: Secondary | ICD-10-CM | POA: Diagnosis not present

## 2018-09-05 DIAGNOSIS — E1165 Type 2 diabetes mellitus with hyperglycemia: Secondary | ICD-10-CM | POA: Diagnosis not present

## 2018-09-05 DIAGNOSIS — E11649 Type 2 diabetes mellitus with hypoglycemia without coma: Secondary | ICD-10-CM | POA: Diagnosis present

## 2018-09-05 LAB — CBC WITH DIFFERENTIAL/PLATELET
ABS IMMATURE GRANULOCYTES: 0.05 10*3/uL (ref 0.00–0.07)
Basophils Absolute: 0 10*3/uL (ref 0.0–0.1)
Basophils Relative: 0 %
Eosinophils Absolute: 0.4 10*3/uL (ref 0.0–0.5)
Eosinophils Relative: 5 %
HCT: 30.2 % — ABNORMAL LOW (ref 36.0–46.0)
HEMOGLOBIN: 8.7 g/dL — AB (ref 12.0–15.0)
Immature Granulocytes: 1 %
LYMPHS PCT: 21 %
Lymphs Abs: 1.4 10*3/uL (ref 0.7–4.0)
MCH: 27 pg (ref 26.0–34.0)
MCHC: 28.8 g/dL — ABNORMAL LOW (ref 30.0–36.0)
MCV: 93.8 fL (ref 80.0–100.0)
Monocytes Absolute: 0.6 10*3/uL (ref 0.1–1.0)
Monocytes Relative: 9 %
NEUTROS PCT: 64 %
Neutro Abs: 4.4 10*3/uL (ref 1.7–7.7)
Platelets: 286 10*3/uL (ref 150–400)
RBC: 3.22 MIL/uL — ABNORMAL LOW (ref 3.87–5.11)
RDW: 15.6 % — ABNORMAL HIGH (ref 11.5–15.5)
WBC: 6.8 10*3/uL (ref 4.0–10.5)
nRBC: 0 % (ref 0.0–0.2)

## 2018-09-05 LAB — BASIC METABOLIC PANEL
Anion gap: 7 (ref 5–15)
BUN: 24 mg/dL — ABNORMAL HIGH (ref 8–23)
CHLORIDE: 102 mmol/L (ref 98–111)
CO2: 26 mmol/L (ref 22–32)
Calcium: 7.9 mg/dL — ABNORMAL LOW (ref 8.9–10.3)
Creatinine, Ser: 0.96 mg/dL (ref 0.44–1.00)
GFR calc non Af Amer: >60 mL/min (ref >60)
Glucose, Bld: 175 mg/dL — ABNORMAL HIGH (ref 70–99)
Potassium: 3.5 mmol/L (ref 3.5–5.1)
Sodium: 135 mmol/L (ref 135–145)

## 2018-09-05 LAB — GLUCOSE, CAPILLARY
GLUCOSE-CAPILLARY: 152 mg/dL — AB (ref 70–99)
GLUCOSE-CAPILLARY: 214 mg/dL — AB (ref 70–99)
Glucose-Capillary: 147 mg/dL — ABNORMAL HIGH (ref 70–99)
Glucose-Capillary: 157 mg/dL — ABNORMAL HIGH (ref 70–99)

## 2018-09-05 MED ORDER — ESCITALOPRAM OXALATE 10 MG PO TABS
10.0000 mg | ORAL_TABLET | Freq: Every day | ORAL | Status: DC
  Administered 2018-09-05 – 2018-09-13 (×9): 10 mg via ORAL
  Filled 2018-09-05 (×9): qty 1

## 2018-09-05 MED ORDER — NYSTATIN 100000 UNIT/GM EX POWD
Freq: Three times a day (TID) | CUTANEOUS | Status: DC
  Administered 2018-09-05 – 2018-09-13 (×20): via TOPICAL
  Filled 2018-09-05 (×3): qty 15

## 2018-09-05 MED ORDER — BUPROPION HCL ER (XL) 300 MG PO TB24
300.0000 mg | ORAL_TABLET | Freq: Every day | ORAL | Status: DC
  Administered 2018-09-05 – 2018-09-13 (×9): 300 mg via ORAL
  Filled 2018-09-05 (×5): qty 1
  Filled 2018-09-05 (×2): qty 2
  Filled 2018-09-05: qty 1
  Filled 2018-09-05: qty 2

## 2018-09-05 MED ORDER — METOPROLOL TARTRATE 25 MG PO TABS
12.5000 mg | ORAL_TABLET | Freq: Two times a day (BID) | ORAL | Status: DC
  Administered 2018-09-05 – 2018-09-06 (×4): 12.5 mg via ORAL
  Filled 2018-09-05 (×4): qty 1

## 2018-09-05 MED ORDER — TRAZODONE HCL 50 MG PO TABS
25.0000 mg | ORAL_TABLET | Freq: Every day | ORAL | Status: DC
  Administered 2018-09-05 – 2018-09-11 (×7): 25 mg via ORAL
  Filled 2018-09-05 (×7): qty 1

## 2018-09-05 MED ORDER — DICYCLOMINE HCL 10 MG PO CAPS
20.0000 mg | ORAL_CAPSULE | Freq: Three times a day (TID) | ORAL | Status: DC
  Administered 2018-09-05 (×2): 20 mg via ORAL
  Filled 2018-09-05 (×2): qty 2

## 2018-09-05 MED ORDER — METOPROLOL TARTRATE 5 MG/5ML IV SOLN
5.0000 mg | INTRAVENOUS | Status: DC | PRN
  Administered 2018-09-05: 5 mg via INTRAVENOUS
  Filled 2018-09-05: qty 5

## 2018-09-05 NOTE — Progress Notes (Signed)
PROGRESS NOTE    [name]  ZOX:[id number] DOB: 1950-09-03 DOA: 09/02/2018 PCP: [name], MD    Brief Narrative:  68 year old female admitted to the hospital with abdominal pain and bloody stools.  CT scan showed evidence of colitis.  She is chronically on Eliquis which was reversed in the emergency room.  She was started on intravenous antibiotics.  She is currently being observed and hemoglobin has been stable.  She was seen by gastroenterology with plans for possible endoscopic evaluation on 1/20.   Assessment & Plan:   Principal Problem:   GI bleed Active Problems:   Diabetes type 2, uncontrolled (HCC)   HTN (hypertension)   Depression   Unspecified atrial fibrillation (HCC)   Chronic diastolic CHF (congestive heart failure) (HCC)   CKD (chronic kidney disease), stage III (HCC)   Hypokalemia   COPD (chronic obstructive pulmonary disease) (HCC)   Coagulopathy (HCC)   Colitis   Lower abdominal pain   Rectal bleeding   1. Rectal bleeding.  Felt to be related to colitis in the setting of anticoagulation.  Hemoglobin has been stable.  Seen by GI and will likely plan on endoscopic evaluation on 1/20.  Continue to follow hemoglobin and treat supportively.  We will keep n.p.o. after midnight in case any procedures are planned for a.m. 2. Colitis.  Currently on intravenous antibiotics.  Continues to have some abdominal pain.  No vomiting.  Tolerating diet. 3. Atrial fibrillation.  Was previously on Eliquis, which was reversed in the emergency room due to GI bleeding.  She started to have episodes of tachycardia.  Will start low-dose beta-blockers. 4. 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  Will likely need to restart anticoagulation when stable from GI standpoint.  If this not feasible from a GI standpoint, may need to repeat TEE to evaluate if clot is still persistent. 5. Diabetes.  Initially had hypoglycemia on admission.  This is since improved.  Blood sugars  now stable on sliding scale.  Continue to follow. 6. Hypertension.  Blood pressures have been on the lower side.  Continue to monitor. 7. Chronic diastolic heart failure.  Appears compensated.  Continue to monitor. 8. Depression.  Continue on trazodone and bupropion. 9. COPD with chronic respiratory failure.  Continue on supplemental oxygen and bronchodilators as needed.  No wheezing at this time.   DVT prophylaxis: SCDs Code Status: Full code Family Communication: Discussed with patient, no family present Disposition Plan: Discharge back to nursing facility once bleeding issues have been resolved.   Consultants:   Gastroenterology  Procedures:     Antimicrobials:   Ceftriaxone 1/17 >  Flagyl 1/17 >   Subjective: Continues to have cramping in abdomen.  No vomiting.  Tolerating diet.  Objective: Vitals:   09/05/18 0828 09/05/18 0900 09/05/18 1000 09/05/18 1121  BP:  (!) 106/49 (!) 105/40   Pulse:  (!) 123  77  Resp:  (!) 5 (!) 6 17  Temp:    (!) 100.5 F (38.1 C)  TempSrc:    Oral  SpO2: 98%   98%  Weight:      Height:        Intake/Output Summary (Last 24 hours) at 09/05/2018 1236 Last data filed at 09/05/2018 1041 Gross per 24 hour  Intake 360 ml  Output -  Net 360 ml   Filed Weights   09/02/18 2256 09/03/18 0810 09/04/18 0519  Weight: 115.7 kg 103.4 kg 103.9 kg    Examination:  General exam: Alert, awake,  oriented x 3 Respiratory system: Bilateral rhonchi. Respiratory effort normal. Cardiovascular system: Irregularly irregular. No murmurs, rubs, gallops. Gastrointestinal system: Abdomen is nondistended, soft and nontender. No organomegaly or masses felt. Normal bowel sounds heard. Central nervous system: Alert and oriented. No focal neurological deficits. Extremities: No C/C/E, +pedal pulses Skin: No rashes, lesions or ulcers Psychiatry: Judgement and insight appear normal. Mood & affect appropriate.    Data Reviewed: I have personally reviewed  following labs and imaging studies  CBC: Recent Labs  Lab 09/02/18 2335  09/03/18 1054 09/03/18 1601 09/03/18 2132 09/04/18 0433 09/05/18 0451  WBC 11.2*  --  10.7*  --  8.2 7.6 6.8  NEUTROABS 9.4*  --  7.6  --   --  5.1 4.4  HGB 10.1*   < > 10.0* 8.8* 9.0* 9.2* 8.7*  HCT 35.2*   < > 34.5* 30.8* 30.9* 31.3* 30.2*  MCV 92.4  --  93.2  --  93.1 94.6 93.8  PLT 326  --  313  --  313 232 286   < > = values in this interval not displayed.   Basic Metabolic Panel: Recent Labs  Lab 09/02/18 2335 09/03/18 1054 09/04/18 0433 09/05/18 0451  NA 135 138 137 135  K 3.4* 3.6 3.6 3.5  CL 97* 100 104 102  CO2 28 26 23 26   GLUCOSE 237* 91 171* 175*  BUN 37* 34* 30* 24*  CREATININE 1.07* 1.08* 1.03* 0.96  CALCIUM 7.8* 7.6* 7.6* 7.9*   GFR: Estimated Creatinine Clearance: 66.8 mL/min (by C-G formula based on SCr of 0.96 mg/dL). Liver Function Tests: Recent Labs  Lab 09/02/18 2335  AST 19  ALT 12  ALKPHOS 81  BILITOT 0.9  PROT 6.1*  ALBUMIN 2.7*   No results for input(s): LIPASE, AMYLASE in the last 168 hours. No results for input(s): AMMONIA in the last 168 hours. Coagulation Profile: Recent Labs  Lab 09/02/18 2335  INR 1.94   Cardiac Enzymes: No results for input(s): CKTOTAL, CKMB, CKMBINDEX, TROPONINI in the last 168 hours. BNP (last 3 results) No results for input(s): PROBNP in the last 8760 hours. HbA1C: No results for input(s): HGBA1C in the last 72 hours. CBG: Recent Labs  Lab 09/04/18 1109 09/04/18 1614 09/04/18 2124 09/05/18 0803 09/05/18 1120  GLUCAP 265* 171* 139* 147* 152*   Lipid Profile: No results for input(s): CHOL, HDL, LDLCALC, TRIG, CHOLHDL, LDLDIRECT in the last 72 hours. Thyroid Function Tests: No results for input(s): TSH, T4TOTAL, FREET4, T3FREE, THYROIDAB in the last 72 hours. Anemia Panel: No results for input(s): VITAMINB12, FOLATE, FERRITIN, TIBC, IRON, RETICCTPCT in the last 72 hours. Sepsis Labs: No results for input(s):  PROCALCITON, LATICACIDVEN in the last 168 hours.  No results found for this or any previous visit (from the past 240 hour(s)).       Radiology Studies: No results found.      Scheduled Meds: . chlorhexidine  15 mL Mouth Rinse BID  . dicyclomine  20 mg Oral TID AC  . insulin aspart  0-9 Units Subcutaneous TID WC  . mouth rinse  15 mL Mouth Rinse q12n4p  . metoprolol tartrate  12.5 mg Oral BID  . nystatin   Topical TID  . pantoprazole (PROTONIX) IV  40 mg Intravenous Q12H  . umeclidinium bromide  1 puff Inhalation Daily   Continuous Infusions: . cefTRIAXone (ROCEPHIN)  IV Stopped (09/05/18 0459)  . metronidazole 500 mg (09/05/18 1006)     LOS: 0 days    Time spent: 53mns  Kathie Dike, MD [facility]   If 7PM-7AM, please contact night-coverage [email or web address]  09/05/2018, 12:36 PM

## 2018-09-06 ENCOUNTER — Encounter (HOSPITAL_COMMUNITY)
Admission: EM | Disposition: A | Discharge status: Home / Self Care | Payer: Self-pay | Source: Home / Self Care | Attending: Internal Medicine

## 2018-09-06 ENCOUNTER — Encounter (HOSPITAL_COMMUNITY): Payer: Self-pay | Admitting: Anesthesiology

## 2018-09-06 ENCOUNTER — Inpatient Hospital Stay (HOSPITAL_COMMUNITY): Payer: BLUE CROSS/BLUE SHIELD | Admitting: Anesthesiology

## 2018-09-06 DIAGNOSIS — T39395A Adverse effect of other nonsteroidal anti-inflammatory drugs [NSAID], initial encounter: Secondary | ICD-10-CM

## 2018-09-06 DIAGNOSIS — K2970 Gastritis, unspecified, without bleeding: Secondary | ICD-10-CM

## 2018-09-06 DIAGNOSIS — K222 Esophageal obstruction: Secondary | ICD-10-CM

## 2018-09-06 DIAGNOSIS — R1310 Dysphagia, unspecified: Secondary | ICD-10-CM

## 2018-09-06 DIAGNOSIS — R1319 Other dysphagia: Secondary | ICD-10-CM

## 2018-09-06 DIAGNOSIS — K2960 Other gastritis without bleeding: Secondary | ICD-10-CM

## 2018-09-06 HISTORY — PX: ESOPHAGOGASTRODUODENOSCOPY (EGD) WITH PROPOFOL: SHX5813

## 2018-09-06 HISTORY — PX: BIOPSY: SHX5522

## 2018-09-06 HISTORY — PX: ESOPHAGEAL DILATION: SHX303

## 2018-09-06 LAB — TYPE AND SCREEN
ABO/RH(D): A POS
Antibody Screen: NEGATIVE
Unit division: 0
Unit division: 0
Unit division: 0
Unit division: 0

## 2018-09-06 LAB — CBC WITH DIFFERENTIAL/PLATELET
Abs Immature Granulocytes: 0.07 10*3/uL (ref 0.00–0.07)
Basophils Absolute: 0 10*3/uL (ref 0.0–0.1)
Basophils Relative: 1 %
Eosinophils Absolute: 0.5 10*3/uL (ref 0.0–0.5)
Eosinophils Relative: 7 %
HCT: 29.4 % — ABNORMAL LOW (ref 36.0–46.0)
HEMOGLOBIN: 8.4 g/dL — AB (ref 12.0–15.0)
Immature Granulocytes: 1 %
LYMPHS PCT: 23 %
Lymphs Abs: 1.5 10*3/uL (ref 0.7–4.0)
MCH: 26.9 pg (ref 26.0–34.0)
MCHC: 28.6 g/dL — ABNORMAL LOW (ref 30.0–36.0)
MCV: 94.2 fL (ref 80.0–100.0)
Monocytes Absolute: 0.6 10*3/uL (ref 0.1–1.0)
Monocytes Relative: 9 %
Neutro Abs: 4 10*3/uL (ref 1.7–7.7)
Neutrophils Relative %: 59 %
Platelets: 295 10*3/uL (ref 150–400)
RBC: 3.12 MIL/uL — ABNORMAL LOW (ref 3.87–5.11)
RDW: 15.8 % — ABNORMAL HIGH (ref 11.5–15.5)
WBC: 6.6 10*3/uL (ref 4.0–10.5)
nRBC: 0 % (ref 0.0–0.2)

## 2018-09-06 LAB — BPAM RBC
Blood Product Expiration Date: 202001232359
Blood Product Expiration Date: 202001232359
Blood Product Expiration Date: 202002072359
Blood Product Expiration Date: 202002082359
UNIT TYPE AND RH: 6200
Unit Type and Rh: 6200
Unit Type and Rh: 6200
Unit Type and Rh: 6200

## 2018-09-06 LAB — GLUCOSE, CAPILLARY
Glucose-Capillary: 159 mg/dL — ABNORMAL HIGH (ref 70–99)
Glucose-Capillary: 121 mg/dL — ABNORMAL HIGH (ref 70–99)
Glucose-Capillary: 121 mg/dL — ABNORMAL HIGH (ref 70–99)
Glucose-Capillary: 184 mg/dL — ABNORMAL HIGH (ref 70–99)
Glucose-Capillary: 203 mg/dL — ABNORMAL HIGH (ref 70–99)

## 2018-09-06 LAB — BASIC METABOLIC PANEL
Anion gap: 10 (ref 5–15)
BUN: 21 mg/dL (ref 8–23)
CHLORIDE: 100 mmol/L (ref 98–111)
CO2: 26 mmol/L (ref 22–32)
Calcium: 8.2 mg/dL — ABNORMAL LOW (ref 8.9–10.3)
Creatinine, Ser: 0.92 mg/dL (ref 0.44–1.00)
GFR calc Af Amer: >60 mL/min (ref >60)
GFR calc non Af Amer: >60 mL/min (ref >60)
Glucose, Bld: 165 mg/dL — ABNORMAL HIGH (ref 70–99)
Potassium: 3.7 mmol/L (ref 3.5–5.1)
Sodium: 136 mmol/L (ref 135–145)

## 2018-09-06 SURGERY — ESOPHAGOGASTRODUODENOSCOPY (EGD) WITH PROPOFOL
Anesthesia: Monitor Anesthesia Care

## 2018-09-06 SURGERY — EGD (ESOPHAGOGASTRODUODENOSCOPY)
Anesthesia: Moderate Sedation

## 2018-09-06 MED ORDER — LIDOCAINE HCL (CARDIAC) PF 100 MG/5ML IV SOSY
PREFILLED_SYRINGE | INTRAVENOUS | Status: DC | PRN
  Administered 2018-09-06: 20 mg via INTRAVENOUS

## 2018-09-06 MED ORDER — LACTATED RINGERS IV SOLN: INTRAVENOUS | Status: DC

## 2018-09-06 MED ORDER — SODIUM CHLORIDE 0.9 % IV SOLN: Freq: Once | INTRAVENOUS | Status: DC

## 2018-09-06 MED ORDER — KETAMINE HCL 50 MG/5ML IJ SOSY
PREFILLED_SYRINGE | INTRAMUSCULAR | Status: AC
  Filled 2018-09-06: qty 5

## 2018-09-06 MED ORDER — KETAMINE HCL 10 MG/ML IJ SOLN
INTRAMUSCULAR | Status: DC | PRN
  Administered 2018-09-06: 10 mg via INTRAVENOUS

## 2018-09-06 MED ORDER — SODIUM CHLORIDE 0.9 % IV SOLN
INTRAVENOUS | Status: DC
  Administered 2018-09-06: 11:00:00 via INTRAVENOUS

## 2018-09-06 MED ORDER — MINERAL OIL PO OIL
TOPICAL_OIL | ORAL | Status: AC
  Filled 2018-09-06: qty 30

## 2018-09-06 MED ORDER — PROPOFOL 10 MG/ML IV BOLUS
INTRAVENOUS | Status: AC
  Filled 2018-09-06: qty 40

## 2018-09-06 MED ORDER — DICYCLOMINE HCL 10 MG PO CAPS
20.0000 mg | ORAL_CAPSULE | Freq: Three times a day (TID) | ORAL | Status: DC | PRN
  Administered 2018-09-08: 20 mg via ORAL
  Filled 2018-09-06: qty 2

## 2018-09-06 MED ORDER — STERILE WATER FOR IRRIGATION IR SOLN
Status: DC | PRN
  Administered 2018-09-06: 100 mL

## 2018-09-06 MED ORDER — PROPOFOL 500 MG/50ML IV EMUL
INTRAVENOUS | Status: DC | PRN
  Administered 2018-09-06: 150 ug/kg/min via INTRAVENOUS

## 2018-09-06 MED ORDER — FAMOTIDINE 20 MG PO TABS
20.0000 mg | ORAL_TABLET | Freq: Two times a day (BID) | ORAL | Status: DC
  Administered 2018-09-06 – 2018-09-13 (×15): 20 mg via ORAL
  Filled 2018-09-06 (×16): qty 1

## 2018-09-06 NOTE — Progress Notes (Signed)
PROGRESS NOTE    [name]  NID:[id number] DOB: [birth year] DOA: 09/02/2018 PCP: [name], MD    Brief Narrative:  68 year old female admitted to the hospital with abdominal pain and bloody stools.  CT scan showed evidence of colitis.  She is chronically on Eliquis which was reversed in the emergency room.  She was started on intravenous antibiotics.  She is currently being observed and hemoglobin has been stable.  She was seen by gastroenterology with plans for possible endoscopic evaluation on 1/20.   Assessment & Plan:   Principal Problem:   GI bleed Active Problems:   Diabetes type 2, uncontrolled (HCC)   HTN (hypertension)   Depression   Unspecified atrial fibrillation (HCC)   Chronic diastolic CHF (congestive heart failure) (HCC)   CKD (chronic kidney disease), stage III (HCC)   Hypokalemia   COPD (chronic obstructive pulmonary disease) (HCC)   Coagulopathy (HCC)   Colitis   Lower abdominal pain   Rectal bleeding   Gastritis due to nonsteroidal anti-inflammatory drug   Esophageal dysphagia   1. Rectal bleeding.  Felt to be related to colitis in the setting of anticoagulation.  Hemoglobin has been stable.  Seen by GI and underwent EGD today. Plans are for outpatient colonoscopy.  Continue to follow hemoglobin and treat supportively.  2. Anemia. Patient has chronic anemia, and may have a component of acute blood loss. Continue to monitor hemoglobin  3. Colitis.  Currently on intravenous antibiotics.  Continues to have some abdominal pain.  No vomiting.  Tolerating diet. 4. Atrial fibrillation.  Was previously on Eliquis, which was reversed in the emergency room due to GI bleeding. Heart rate has been trending up, and she has been restarted on low dose beta blockers. This may need to be titrated up based on heart rate. Limitations may be blood pressure. 5. 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  Per GI, restart eliquis on 1/23. 6. Diabetes.  Initially  had hypoglycemia on admission.  This is since improved.  Blood sugars now stable on sliding scale.  Continue to follow. 7. Hypertension.  Blood pressures stable.  Continue to monitor. 8. Chronic diastolic heart failure.  Appears compensated.  Continue to monitor. 9. Depression.  Continue on trazodone and bupropion. 10. COPD with chronic respiratory failure.  Continue on supplemental oxygen and bronchodilators as needed.  No wheezing at this time.   DVT prophylaxis: SCDs Code Status: Full code Family Communication: Discussed with patient, no family present Disposition Plan: Discharge back to nursing facility once bleeding issues have been resolved.   Consultants:   Gastroenterology  Procedures:  EGD:  - Moderate Schatzki ring. Dilated.                           - MILD Gastritis. Biopsied.                            - Non-bleeding duodenal diverticulum  Antimicrobials:   Ceftriaxone 1/17 >  Flagyl 1/17 >   Subjective: Still has some abdominal cramping, but improving. No vomiting.  Objective: Vitals:   09/06/18 1000 09/06/18 1037 09/06/18 1139 09/06/18 1217  BP: (!) 105/57 103/63 (!) 115/59 (!) 133/57  Pulse: 76 (!) 101 (!) 102 (!) 106  Resp: 11 (!) 92 15   Temp:  97.8 F (36.6 C) 97.8 F (36.6 C)   TempSrc:  Oral    SpO2: 100% 92% 92%  Weight:      Height:        Intake/Output Summary (Last 24 hours) at 09/06/2018 1404 Last data filed at 09/06/2018 1132 Gross per 24 hour  Intake 639.32 ml  Output -  Net 639.32 ml   Filed Weights   09/03/18 0810 09/04/18 0519 09/06/18 0400  Weight: 103.4 kg 103.9 kg 107.2 kg    Examination:  General exam: Alert, awake, oriented x 3 Respiratory system: Clear to auscultation. Respiratory effort normal. Cardiovascular system:irregular. No murmurs, rubs, gallops. Gastrointestinal system: Abdomen is nondistended, soft and nontender. No organomegaly or masses felt. Normal bowel sounds heard. Central nervous system: Alert and  oriented. No focal neurological deficits. Extremities: No C/C/E, +pedal pulses Skin: No rashes, lesions or ulcers Psychiatry: Judgement and insight appear normal. Mood & affect appropriate.     Data Reviewed: I have personally reviewed following labs and imaging studies  CBC: Recent Labs  Lab 09/02/18 2335  09/03/18 1054 09/03/18 1601 09/03/18 2132 09/04/18 0433 09/05/18 0451 09/06/18 0621  WBC 11.2*  --  10.7*  --  8.2 7.6 6.8 6.6  NEUTROABS 9.4*  --  7.6  --   --  5.1 4.4 4.0  HGB 10.1*   < > 10.0* 8.8* 9.0* 9.2* 8.7* 8.4*  HCT 35.2*   < > 34.5* 30.8* 30.9* 31.3* 30.2* 29.4*  MCV 92.4  --  93.2  --  93.1 94.6 93.8 94.2  PLT 326  --  313  --  313 232 286 295   < > = values in this interval not displayed.   Basic Metabolic Panel: Recent Labs  Lab 09/02/18 2335 09/03/18 1054 09/04/18 0433 09/05/18 0451 09/06/18 0621  NA 135 138 137 135 136  K 3.4* 3.6 3.6 3.5 3.7  CL 97* 100 104 102 100  CO2 28 26 23 26 26   GLUCOSE 237* 91 171* 175* 165*  BUN 37* 34* 30* 24* 21  CREATININE 1.07* 1.08* 1.03* 0.96 0.92  CALCIUM 7.8* 7.6* 7.6* 7.9* 8.2*   GFR: Estimated Creatinine Clearance: 70.9 mL/min (by C-G formula based on SCr of 0.92 mg/dL). Liver Function Tests: Recent Labs  Lab 09/02/18 2335  AST 19  ALT 12  ALKPHOS 81  BILITOT 0.9  PROT 6.1*  ALBUMIN 2.7*   No results for input(s): LIPASE, AMYLASE in the last 168 hours. No results for input(s): AMMONIA in the last 168 hours. Coagulation Profile: Recent Labs  Lab 09/02/18 2335  INR 1.94   Cardiac Enzymes: No results for input(s): CKTOTAL, CKMB, CKMBINDEX, TROPONINI in the last 168 hours. BNP (last 3 results) No results for input(s): PROBNP in the last 8760 hours. HbA1C: No results for input(s): HGBA1C in the last 72 hours. CBG: Recent Labs  Lab 09/05/18 1620 09/05/18 2204 09/06/18 0750 09/06/18 1051 09/06/18 1141  GLUCAP 214* 157* 159* 121* 121*   Lipid Profile: No results for input(s): CHOL, HDL,  LDLCALC, TRIG, CHOLHDL, LDLDIRECT in the last 72 hours. Thyroid Function Tests: No results for input(s): TSH, T4TOTAL, FREET4, T3FREE, THYROIDAB in the last 72 hours. Anemia Panel: No results for input(s): VITAMINB12, FOLATE, FERRITIN, TIBC, IRON, RETICCTPCT in the last 72 hours. Sepsis Labs: No results for input(s): PROCALCITON, LATICACIDVEN in the last 168 hours.  No results found for this or any previous visit (from the past 240 hour(s)).       Radiology Studies: No results found.      Scheduled Meds: . buPROPion  300 mg Oral Daily  . chlorhexidine  15 mL Mouth  Rinse BID  . escitalopram  10 mg Oral Daily  . famotidine  20 mg Oral BID  . insulin aspart  0-9 Units Subcutaneous TID WC  . mouth rinse  15 mL Mouth Rinse q12n4p  . metoprolol tartrate  12.5 mg Oral BID  . nystatin   Topical TID  . traZODone  25 mg Oral QHS  . umeclidinium bromide  1 puff Inhalation Daily   Continuous Infusions: . cefTRIAXone (ROCEPHIN)  IV Stopped (09/06/18 0458)  . metronidazole 500 mg (09/06/18 1223)     LOS: 1 day    Time spent: 76mns    JKathie Dike MD [facility]   If 7PM-7AM, please contact night-coverage [email or web address]  09/06/2018, 2:04 PM

## 2018-09-06 NOTE — Progress Notes (Signed)
PT NPO since Midnight. Consent for EGD and Colonoscopy signed. Awaiting orders and instruction from GI.

## 2018-09-06 NOTE — Op Note (Addendum)
[facility] Patient Name: [name] Procedure Date: 09/06/2018 10:54 AM MRN: [id number] Date of Birth: 1951-07-29 Attending MD: Barney Drain MD, MD CSN: [id number] Age: 68 Admit Type: Inpatient Procedure:                Upper GI endoscopy WITH COLD FORCEPS                            BIOPSY/ESOPHAGEAL DILATION Indications:              Dysphagia Providers:                Barney Drain MD, MD, Rosina Lowenstein, RN, Nelma Rothman,                            Technician Referring MD:             [name] Medicines:                Propofol per Anesthesia Complications:            No immediate complications. Estimated Blood Loss:     Estimated blood loss was minimal. Procedure:                Pre-Anesthesia Assessment:                           - Prior to the procedure, a History and Physical                            was performed, and patient medications and                            allergies were reviewed. The patient's tolerance of                            previous anesthesia was also reviewed. The risks                            and benefits of the procedure and the sedation                            options and risks were discussed with the patient.                            All questions were answered, and informed consent                            was obtained. Prior Anticoagulants: The patient has                            taken Eliquis (apixaban), last dose was 3 days                            prior to procedure. ASA Grade Assessment: III - A  patient with severe systemic disease. After                            reviewing the risks and benefits, the patient was                            deemed in satisfactory condition to undergo the                            procedure. After obtaining informed consent, the                            endoscope was passed under direct vision.                            Throughout the procedure,  the patient's blood                            pressure, pulse, and oxygen saturations were                            monitored continuously. The [id number]-[id number] ([id number])                            scope was introduced through the mouth, and                            advanced to the second part of duodenum. The upper                            GI endoscopy was accomplished without difficulty.                            The patient tolerated the procedure well. Scope In: 11:22:56 AM Scope Out: 11:31:18 AM Total Procedure Duration: 0 hours 8 minutes 22 seconds  Findings:      A moderate Schatzki ring was found at the gastroesophageal junction. A       guidewire was placed and the scope was withdrawn. Dilation was performed       with a Savary dilator with mild resistance at 14 mm, 15 mm, 16 mm and 17       mm. Estimated blood loss was minimal.      Diffuse mild inflammation characterized by congestion (edema), erosions       and erythema was found in the entire examined stomach. Biopsies were       taken with a cold forceps for Helicobacter pylori testing.      A medium non-bleeding diverticulum was found in the second portion of       the duodenum.      The duodenal bulb was normal. Impression:               - Moderate Schatzki ring. Dilated.                           - MILD Gastritis. Biopsied.                           -  Non-bleeding duodenal diverticulum. Moderate Sedation:      Per Anesthesia Care Recommendation:           - Return patient to hospital ward for observation.                           - Low fat diet and lactose free diet.                           - Continue present medications. MAY RE-START                            ELIQUIS JAN 23.                           - Await pathology results.                           - Return to my office in 4 weeks TO SCHEDULE A                            COLONOSCOPY.                           - DISCUSSED FINDINGS AND RECOMMENDATIONS WITH                             HUSBAND ON JAN 20. Procedure Code(s):        --- Professional ---                           [phone], Esophagogastroduodenoscopy, flexible,                            transoral; with insertion of guide wire followed by                            passage of dilator(s) through esophagus over guide                            wire                           43239, 59, Esophagogastroduodenoscopy, flexible,                            transoral; with biopsy, single or multiple Diagnosis Code(s):        --- Professional ---                           K22.2, Esophageal obstruction                           K29.70, Gastritis, unspecified, without bleeding                           R13.10, Dysphagia, unspecified  K57.10, Diverticulosis of small intestine without                            perforation or abscess without bleeding CPT copyright 2018 American Medical Association. All rights reserved. The codes documented in this report are preliminary and upon coder review may  be revised to meet current compliance requirements. Barney Drain, MD Barney Drain MD, MD 09/06/2018 11:46:22 AM This report has been signed electronically. Number of Addenda: 0

## 2018-09-06 NOTE — Anesthesia Postprocedure Evaluation (Signed)
Anesthesia Post Note  Patient: [name]  Procedure(s) Performed: ESOPHAGOGASTRODUODENOSCOPY (EGD) WITH PROPOFOL (N/A ) ESOPHAGEAL DILATION BIOPSY  Patient location during evaluation: PACU Anesthesia Type: MAC Level of consciousness: awake and alert and oriented Pain management: pain level controlled Vital Signs Assessment: post-procedure vital signs reviewed and stable Respiratory status: spontaneous breathing Cardiovascular status: stable Postop Assessment: no apparent nausea or vomiting Anesthetic complications: no     Last Vitals:  Vitals:   09/06/18 1037 09/06/18 1139  BP: 103/63 (!) 115/59  Pulse: (!) 101 (!) 102  Resp: (!) 92 15  Temp: 36.6 C 36.6 C  SpO2: 92% 92%    Last Pain:  Vitals:   09/06/18 1135  TempSrc:   PainSc: 0-No pain                 ADAMS, AMY A

## 2018-09-06 NOTE — Progress Notes (Addendum)
    Subjective: Still with intermittent abdominal pain, points to upper abdomen. Wants to pursue endoscopic procedures. Solid food dysphagia intermittently. No N/V. Small amount of blood per rectum yesterday, improving since admission.   Objective: Vital signs in last 24 hours: Temp:  [97.7 F (36.5 C)-100.5 F (38.1 C)] 97.7 F (36.5 C) (01/20 0700) Pulse Rate:  [76-126] 100 (01/20 0900) Resp:  [6-32] 20 (01/20 0900) BP: (89-122)/(38-85) 100/66 (01/20 0900) SpO2:  [90 %-100 %] 100 % (01/20 0900) Weight:  [107.2 kg] 107.2 kg (01/20 0400) Last BM Date: 09/05/18 General:   Alert and oriented, pleasant Head:  Normocephalic and atraumatic. Abdomen:  Bowel sounds present, soft, TTP upper abdomen moreso than lower abdomen, no rebound or guarding Extremities:  Without edema Neurologic:  Alert and  oriented x4 Psych:  Alert and cooperative. Normal mood and affect.  Intake/Output from previous day: 01/19 0701 - 01/20 0700 In: 639.3 [P.O.:240; IV Piggyback:399.3] Out: -  Intake/Output this shift: No intake/output data recorded.  Lab Results: Recent Labs    09/04/18 0433 09/05/18 0451 09/06/18 0621  WBC 7.6 6.8 6.6  HGB 9.2* 8.7* 8.4*  HCT 31.3* 30.2* 29.4*  PLT 232 286 295   BMET Recent Labs    09/04/18 0433 09/05/18 0451 09/06/18 0621  NA 137 135 136  K 3.6 3.5 3.7  CL 104 102 100  CO2 23 26 26   GLUCOSE 171* 175* 165*  BUN 30* 24* 21  CREATININE 1.03* 0.96 0.92  CALCIUM 7.6* 7.9* 8.2*     Assessment: 68 year old female with multiple comorbidities, presenting with abdominal pain and rectal bleeding in setting of chronic Eliquis, which has been held since admission. Very small amounts of rectal bleeding, improving since admission.   Anemia: Hgb slowly trending down, 8.4 today. Small amounts of blood yesterday. Eliquis on hold.  Colitis: improving. Empirically on antibiotics. Currently without diarrhea.   Dysphagia: dilation as appropriate at time of EGD. Barium  swallow limited this admission due to decreased mobility, unable to swallow tablet. Nonspecific esophageal motility disorder likely contributing to symptoms.   Pancreatic lesion on CT: outpatient follow-up with further imaging  Right ovarian multicystic lesion: follow-up as outpatient   Plan: Remain NPO EGD/dilatation today with Dr. Oneida Alar. Propofol due to polypharmacy. Continue empiric antibiotics for colitis Will need colonoscopy as outpatient once colitis resolved Outpatient evaluation of pancreatic lesion and ovarian lesion   Annitta Needs, PhD, ANP-BC [facility] Gastroenterology    LOS: 1 day    09/06/2018, 9:18 AM

## 2018-09-06 NOTE — Anesthesia Preprocedure Evaluation (Addendum)
Anesthesia Evaluation  Patient identified by MRN, date of birth, ID band Patient awake    Reviewed: Allergy & Precautions, H&P , NPO status , Patient's Chart, lab work & pertinent test results  Airway Mallampati: II  TM Distance: >3 FB Neck ROM: limited  Mouth opening: Limited Mouth Opening  Dental no notable dental hx.    Pulmonary COPD,    Pulmonary exam normal breath sounds clear to auscultation       Cardiovascular Exercise Tolerance: Good hypertension, +CHF  Atrial Fibrillation  Rhythm:regular Rate:Normal  EF 60%   Neuro/Psych PSYCHIATRIC DISORDERS Depression negative neurological ROS     GI/Hepatic negative GI ROS, Neg liver ROS, Anemia, lower GI bleed, no hematemesis    Endo/Other  diabetesMorbid obesity  Renal/GU Renal disease  negative genitourinary   Musculoskeletal   Abdominal   Peds  Hematology negative hematology ROS (+)   Anesthesia Other Findings   Reproductive/Obstetrics negative OB ROS                            Anesthesia Physical Anesthesia Plan  ASA: III  Anesthesia Plan: MAC   Post-op Pain Management:    Induction:   PONV Risk Score and Plan:   Airway Management Planned:   Additional Equipment:   Intra-op Plan:   Post-operative Plan:   Informed Consent: I have reviewed the patients History and Physical, chart, labs and discussed the procedure including the risks, benefits and alternatives for the proposed anesthesia with the patient or authorized representative who has indicated his/her understanding and acceptance.     Dental Advisory Given  Plan Discussed with: CRNA  Anesthesia Plan Comments:        Anesthesia Quick Evaluation

## 2018-09-06 NOTE — Interval H&P Note (Signed)
History and Physical Interval Note:  09/06/2018 11:06 AM  [name]  has presented today for surgery, with the diagnosis of anemia, rectal bleeding  The various methods of treatment have been discussed with the patient and family. After consideration of risks, benefits and other options for treatment, the patient has consented to  Procedure(s) with comments: ESOPHAGOGASTRODUODENOSCOPY (EGD) WITH PROPOFOL (N/A) - dilatation as a surgical intervention .  The patient's history has been reviewed, patient examined, no change in status, stable for surgery.  I have reviewed the patient's chart and labs.  Questions were answered to the patient's satisfaction.     [employer]

## 2018-09-06 NOTE — Transfer of Care (Signed)
Immediate Anesthesia Transfer of Care Note  Patient: [name]  Procedure(s) Performed: ESOPHAGOGASTRODUODENOSCOPY (EGD) WITH PROPOFOL (N/A ) ESOPHAGEAL DILATION BIOPSY  Patient Location: PACU  Anesthesia Type:MAC  Level of Consciousness: awake, alert , oriented and patient cooperative  Airway & Oxygen Therapy: Patient Spontanous Breathing  Post-op Assessment: Report given to RN and Post -op Vital signs reviewed and stable  Post vital signs: Reviewed and stable  Last Vitals:  Vitals Value Taken Time  BP    Temp    Pulse    Resp    SpO2      Last Pain:  Vitals:   09/06/18 1135  TempSrc:   PainSc: 0-No pain      Patients Stated Pain Goal: 6 (64/15/83 0940)  Complications: No apparent anesthesia complications

## 2018-09-07 DIAGNOSIS — D689 Coagulation defect, unspecified: Secondary | ICD-10-CM

## 2018-09-07 DIAGNOSIS — I10 Essential (primary) hypertension: Secondary | ICD-10-CM

## 2018-09-07 DIAGNOSIS — K625 Hemorrhage of anus and rectum: Secondary | ICD-10-CM

## 2018-09-07 DIAGNOSIS — E861 Hypovolemia: Secondary | ICD-10-CM

## 2018-09-07 DIAGNOSIS — N183 Chronic kidney disease, stage 3 (moderate): Secondary | ICD-10-CM

## 2018-09-07 DIAGNOSIS — K922 Gastrointestinal hemorrhage, unspecified: Secondary | ICD-10-CM

## 2018-09-07 DIAGNOSIS — I4891 Unspecified atrial fibrillation: Secondary | ICD-10-CM

## 2018-09-07 DIAGNOSIS — E1165 Type 2 diabetes mellitus with hyperglycemia: Secondary | ICD-10-CM

## 2018-09-07 DIAGNOSIS — R1030 Lower abdominal pain, unspecified: Secondary | ICD-10-CM

## 2018-09-07 DIAGNOSIS — I9589 Other hypotension: Secondary | ICD-10-CM

## 2018-09-07 DIAGNOSIS — I5032 Chronic diastolic (congestive) heart failure: Secondary | ICD-10-CM

## 2018-09-07 DIAGNOSIS — K529 Noninfective gastroenteritis and colitis, unspecified: Secondary | ICD-10-CM

## 2018-09-07 DIAGNOSIS — R1310 Dysphagia, unspecified: Secondary | ICD-10-CM

## 2018-09-07 DIAGNOSIS — T39395A Adverse effect of other nonsteroidal anti-inflammatory drugs [NSAID], initial encounter: Secondary | ICD-10-CM

## 2018-09-07 DIAGNOSIS — K2960 Other gastritis without bleeding: Secondary | ICD-10-CM

## 2018-09-07 LAB — GLUCOSE, CAPILLARY
Glucose-Capillary: 165 mg/dL — ABNORMAL HIGH (ref 70–99)
Glucose-Capillary: 170 mg/dL — ABNORMAL HIGH (ref 70–99)
Glucose-Capillary: 226 mg/dL — ABNORMAL HIGH (ref 70–99)

## 2018-09-07 LAB — CBC WITH DIFFERENTIAL/PLATELET
Abs Immature Granulocytes: 0.07 10*3/uL (ref 0.00–0.07)
Basophils Absolute: 0 10*3/uL (ref 0.0–0.1)
Basophils Relative: 0 %
Eosinophils Absolute: 0.4 10*3/uL (ref 0.0–0.5)
Eosinophils Relative: 6 %
HCT: 31.4 % — ABNORMAL LOW (ref 36.0–46.0)
Hemoglobin: 8.7 g/dL — ABNORMAL LOW (ref 12.0–15.0)
Immature Granulocytes: 1 %
Lymphocytes Relative: 26 %
Lymphs Abs: 1.5 10*3/uL (ref 0.7–4.0)
MCH: 27 pg (ref 26.0–34.0)
MCHC: 27.7 g/dL — ABNORMAL LOW (ref 30.0–36.0)
MCV: 97.5 fL (ref 80.0–100.0)
Monocytes Absolute: 0.6 10*3/uL (ref 0.1–1.0)
Monocytes Relative: 11 %
NEUTROS ABS: 3.3 10*3/uL (ref 1.7–7.7)
Neutrophils Relative %: 56 %
PLATELETS: 264 10*3/uL (ref 150–400)
RBC: 3.22 MIL/uL — ABNORMAL LOW (ref 3.87–5.11)
RDW: 15.6 % — ABNORMAL HIGH (ref 11.5–15.5)
WBC: 5.9 10*3/uL (ref 4.0–10.5)
nRBC: 0 % (ref 0.0–0.2)

## 2018-09-07 LAB — BASIC METABOLIC PANEL
Anion gap: 9 (ref 5–15)
BUN: 17 mg/dL (ref 8–23)
CO2: 23 mmol/L (ref 22–32)
Calcium: 8.4 mg/dL — ABNORMAL LOW (ref 8.9–10.3)
Chloride: 105 mmol/L (ref 98–111)
Creatinine, Ser: 0.83 mg/dL (ref 0.44–1.00)
GFR calc Af Amer: >60 mL/min (ref >60)
Glucose, Bld: 169 mg/dL — ABNORMAL HIGH (ref 70–99)
POTASSIUM: 4.1 mmol/L (ref 3.5–5.1)
Sodium: 137 mmol/L (ref 135–145)

## 2018-09-07 MED ORDER — PANTOPRAZOLE SODIUM 40 MG PO TBEC
40.0000 mg | DELAYED_RELEASE_TABLET | Freq: Every day | ORAL | Status: DC
  Administered 2018-09-08 – 2018-09-13 (×6): 40 mg via ORAL
  Filled 2018-09-07 (×6): qty 1

## 2018-09-07 MED ORDER — METOPROLOL TARTRATE 25 MG PO TABS
25.0000 mg | ORAL_TABLET | Freq: Two times a day (BID) | ORAL | Status: DC
  Administered 2018-09-07 – 2018-09-13 (×13): 25 mg via ORAL
  Filled 2018-09-07 (×13): qty 1

## 2018-09-07 NOTE — Clinical Social Work Note (Signed)
LCSW following. Per MD, pt may be ready for dc Friday 1/24. Updated Chris at [facility]. Pt needs PT eval so SNF can obtain insurance authorization. Updated MD. Met with pt to update. She remains in agreement with plan to dc to [facility]. Will follow.

## 2018-09-07 NOTE — Progress Notes (Signed)
PROGRESS NOTE   [name]  OAC:[id number] DOB: 18-Aug-1951 DOA: 09/02/2018 PCP: [name], MD   Brief Narrative:  68 year old female admitted to the hospital with abdominal pain and bloody stools.  CT scan showed evidence of colitis.  She is chronically on Eliquis which was reversed in the emergency room.  She was started on intravenous antibiotics.  She is currently being observed and hemoglobin has been stable.  She was seen by gastroenterology with plans for possible endoscopic evaluation on 1/20.  Assessment & Plan:   Principal Problem:   GI bleed Active Problems:   Diabetes type 2, uncontrolled (HCC)   HTN (hypertension)   Depression   Unspecified atrial fibrillation (HCC)   Chronic diastolic CHF (congestive heart failure) (HCC)   CKD (chronic kidney disease), stage III (HCC)   Hypokalemia   COPD (chronic obstructive pulmonary disease) (HCC)   Coagulopathy (HCC)   Colitis   Lower abdominal pain   Rectal bleeding   Gastritis due to nonsteroidal anti-inflammatory drug   Esophageal dysphagia  1. Rectal bleeding.  Felt to be related to colitis in the setting of anticoagulation.  Hemoglobin has been stable.  Seen by GI and underwent EGD with Schatzki ring and mild gastritis reported. Plans are for outpatient colonoscopy.  Continue to follow hemoglobin and treat supportively.   Resume apixaban 1/23.  2. Chronic blood loss Anemia. Hg stable.  Continue to monitor hemoglobin  3. Colitis.  Currently on intravenous antibiotics.   No vomiting.  Tolerating diet. 4. Atrial fibrillation.  Was previously on Eliquis, which was reversed in the emergency room due to GI bleeding. Heart rate has been trending up, and she has been restarted on beta blockers.  Holding diltiazem due to hypotension. This may need to be titrated up based on heart rate. Limitations may be blood pressure. 5. 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  Per GI, restart eliquis on 1/23. 6. Type 2 diabetes  mellitus.  Initially had hypoglycemia on admission.  This is since improved.  Blood sugars now stable on sliding scale.  Continue to follow. 7. Hypertension.  Blood pressures stable.  Continue to monitor. 8. Chronic diastolic heart failure.  Appears compensated.  Continue to monitor. 9. Depression.  Continue on trazodone and bupropion. 10. COPD with chronic respiratory failure.  Continue on supplemental oxygen and bronchodilators as needed.  No wheezing at this time.  Follow clinically.   DVT prophylaxis: SCDs Code Status: Full code Family Communication: Discussed with patient, no family present Disposition Plan: Discharge back to nursing facility once bleeding issues have been resolved.  Consultants:   Gastroenterology  Procedures:  EGD:  - Moderate Schatzki ring. Dilated.                           - MILD Gastritis. Biopsied.                      - Non-bleeding duodenal diverticulum  Antimicrobials:   Ceftriaxone 1/17 >  Flagyl 1/17 >   Subjective: Pt denies any recurrent bleeding. No black stools.  Tolerating diet.   Objective: Vitals:   09/07/18 0000 09/07/18 0400 09/07/18 0500 09/07/18 0939  BP: (!) 92/52 102/80  114/66  Pulse: (!) 112 (!) 111  97  Resp: (!) 21 16    Temp: 98 F (36.7 C) 97.6 F (36.4 C)    TempSrc: Oral Oral    SpO2: 94% 94%    Weight:  107.8 kg   Height:        Intake/Output Summary (Last 24 hours) at 09/07/2018 1007 Last data filed at 09/07/2018 0449 Gross per 24 hour  Intake 640.68 ml  Output 200 ml  Net 440.68 ml   Filed Weights   09/04/18 0519 09/06/18 0400 09/07/18 0500  Weight: 103.9 kg 107.2 kg 107.8 kg   Examination:  General exam: Alert, awake, oriented x 3 Respiratory system: Clear to auscultation. Respiratory effort normal. Cardiovascular system: irregularly irregular. No murmurs, rubs, gallops. Gastrointestinal system: Abdomen is nondistended, soft and nontender. No organomegaly or masses felt. Normal bowel sounds  heard. Central nervous system: Alert and oriented. No focal neurological deficits. Extremities: No C/C/E, +pedal pulses Skin: No rashes, lesions or ulcers Psychiatry: Judgement and insight appear normal. Mood & affect appropriate.   Data Reviewed: I have personally reviewed following labs and imaging studies  CBC: Recent Labs  Lab 09/03/18 1054  09/03/18 2132 09/04/18 0433 09/05/18 0451 09/06/18 0621 09/07/18 0443  WBC 10.7*  --  8.2 7.6 6.8 6.6 5.9  NEUTROABS 7.6  --   --  5.1 4.4 4.0 3.3  HGB 10.0*   < > 9.0* 9.2* 8.7* 8.4* 8.7*  HCT 34.5*   < > 30.9* 31.3* 30.2* 29.4* 31.4*  MCV 93.2  --  93.1 94.6 93.8 94.2 97.5  PLT 313  --  313 232 286 295 264   < > = values in this interval not displayed.   Basic Metabolic Panel: Recent Labs  Lab 09/03/18 1054 09/04/18 0433 09/05/18 0451 09/06/18 0621 09/07/18 0443  NA 138 137 135 136 137  K 3.6 3.6 3.5 3.7 4.1  CL 100 104 102 100 105  CO2 26 23 26 26 23   GLUCOSE 91 171* 175* 165* 169*  BUN 34* 30* 24* 21 17  CREATININE 1.08* 1.03* 0.96 0.92 0.83  CALCIUM 7.6* 7.6* 7.9* 8.2* 8.4*   GFR: Estimated Creatinine Clearance: 78.8 mL/min (by C-G formula based on SCr of 0.83 mg/dL). Liver Function Tests: Recent Labs  Lab 09/02/18 2335  AST 19  ALT 12  ALKPHOS 81  BILITOT 0.9  PROT 6.1*  ALBUMIN 2.7*   No results for input(s): LIPASE, AMYLASE in the last 168 hours. No results for input(s): AMMONIA in the last 168 hours. Coagulation Profile: Recent Labs  Lab 09/02/18 2335  INR 1.94   Cardiac Enzymes: No results for input(s): CKTOTAL, CKMB, CKMBINDEX, TROPONINI in the last 168 hours. BNP (last 3 results) No results for input(s): PROBNP in the last 8760 hours. HbA1C: No results for input(s): HGBA1C in the last 72 hours. CBG: Recent Labs  Lab 09/06/18 1051 09/06/18 1141 09/06/18 1712 09/06/18 2146 09/07/18 0726  GLUCAP 121* 121* 184* 203* 165*   Lipid Profile: No results for input(s): CHOL, HDL, LDLCALC, TRIG,  CHOLHDL, LDLDIRECT in the last 72 hours. Thyroid Function Tests: No results for input(s): TSH, T4TOTAL, FREET4, T3FREE, THYROIDAB in the last 72 hours. Anemia Panel: No results for input(s): VITAMINB12, FOLATE, FERRITIN, TIBC, IRON, RETICCTPCT in the last 72 hours. Sepsis Labs: No results for input(s): PROCALCITON, LATICACIDVEN in the last 168 hours.  No results found for this or any previous visit (from the past 240 hour(s)).   Radiology Studies: No results found.  Scheduled Meds: . buPROPion  300 mg Oral Daily  . chlorhexidine  15 mL Mouth Rinse BID  . escitalopram  10 mg Oral Daily  . famotidine  20 mg Oral BID  . insulin aspart  0-9 Units Subcutaneous  TID WC  . mouth rinse  15 mL Mouth Rinse q12n4p  . metoprolol tartrate  25 mg Oral BID  . nystatin   Topical TID  . pantoprazole  40 mg Oral QAC breakfast  . traZODone  25 mg Oral QHS  . umeclidinium bromide  1 puff Inhalation Daily   Continuous Infusions: . cefTRIAXone (ROCEPHIN)  IV 2 g (09/07/18 0454)  . metronidazole Stopped (09/07/18 0430)     LOS: 2 days    Time spent: 27 mins  Raymir Frommelt, MD [facility]  If 7PM-7AM, please contact night-coverage [email or web address]  09/07/2018, 10:07 AM

## 2018-09-07 NOTE — Progress Notes (Signed)
    Subjective: Mild nausea, no vomiting. Occasional abdominal cramping but improved. No rectal bleeding. No diarrhea. Feels overall improved. Would like biscuits and gravy.   Objective: Vital signs in last 24 hours: Temp:  [97.5 F (36.4 C)-98 F (36.7 C)] 97.6 F (36.4 C) (01/21 0400) Pulse Rate:  [76-112] 111 (01/21 0400) Resp:  [9-92] 16 (01/21 0400) BP: (92-133)/(52-80) 102/80 (01/21 0400) SpO2:  [82 %-100 %] 94 % (01/21 0400) Weight:  [107.8 kg] 107.8 kg (01/21 0500) Last BM Date: 09/06/18 General:   Alert and oriented, pleasant Head:  Normocephalic and atraumatic. Abdomen:  Bowel sounds present, soft, mild TTP upper abdomen without rebound or guarding, obese. Extremities:  Without edema. Neurologic:  Alert and  oriented x4.  Intake/Output from previous day: 01/20 0701 - 01/21 0700 In: 640.7 [P.O.:240; I.V.:100; IV Piggyback:300.7] Out: 200 [Urine:200] Intake/Output this shift: No intake/output data recorded.  Lab Results: Recent Labs    09/05/18 0451 09/06/18 0621 09/07/18 0443  WBC 6.8 6.6 5.9  HGB 8.7* 8.4* 8.7*  HCT 30.2* 29.4* 31.4*  PLT 286 295 264   BMET Recent Labs    09/05/18 0451 09/06/18 0621 09/07/18 0443  NA 135 136 137  K 3.5 3.7 4.1  CL 102 100 105  CO2 26 26 23   GLUCOSE 175* 165* 169*  BUN 24* 21 17  CREATININE 0.96 0.92 0.83  CALCIUM 7.9* 8.2* 8.4*    Assessment: 68 year old female with multiple comorbidities, presenting with abdominal pain and rectal bleeding in setting of chronic Eliquis, which has been held since admission.  Anemia: Hgb stable. EGD completed yesterday with moderate Schatzki ring s/p dilation, mild gastritis s/p biopsy, non-bleeding duodenal diverticulum.   Colitis: clinically improved without further rectal bleeding. Continue antibiotics and complete 10 day course. Restart Eliquis on Jan 23. No further rectal bleeding.   Pancreatic lesion on CT: outpatient follow-up with further imaging  Right ovarian  multicystic lesion: follow-up as outpatient   Plan: Complete 10 day of antibiotics with Cipro and Flagyl Restart Eliquis Jan 23 Will follow-up on pending gastric biopsies Colonoscopy in 4 weeks as outpatient Protonix daily before breakfast Outpatient follow-up of pancreatic and ovarian lesions   Annitta Needs, PhD, ANP-BC [facility] Gastroenterology    LOS: 2 days    09/07/2018, 8:24 AM

## 2018-09-08 ENCOUNTER — Encounter (HOSPITAL_COMMUNITY): Payer: Self-pay | Admitting: Gastroenterology

## 2018-09-08 LAB — CBC WITH DIFFERENTIAL/PLATELET
Abs Immature Granulocytes: 0.1 10*3/uL — ABNORMAL HIGH (ref 0.00–0.07)
Basophils Absolute: 0 10*3/uL (ref 0.0–0.1)
Basophils Relative: 1 %
Eosinophils Absolute: 0.4 10*3/uL (ref 0.0–0.5)
Eosinophils Relative: 6 %
HCT: 28.4 % — ABNORMAL LOW (ref 36.0–46.0)
HEMOGLOBIN: 8.1 g/dL — AB (ref 12.0–15.0)
Immature Granulocytes: 2 %
LYMPHS PCT: 24 %
Lymphs Abs: 1.5 10*3/uL (ref 0.7–4.0)
MCH: 26.7 pg (ref 26.0–34.0)
MCHC: 28.5 g/dL — ABNORMAL LOW (ref 30.0–36.0)
MCV: 93.7 fL (ref 80.0–100.0)
Monocytes Absolute: 0.8 10*3/uL (ref 0.1–1.0)
Monocytes Relative: 12 %
Neutro Abs: 3.7 10*3/uL (ref 1.7–7.7)
Neutrophils Relative %: 55 %
Platelets: 291 10*3/uL (ref 150–400)
RBC: 3.03 MIL/uL — ABNORMAL LOW (ref 3.87–5.11)
RDW: 15.9 % — ABNORMAL HIGH (ref 11.5–15.5)
WBC: 6.5 10*3/uL (ref 4.0–10.5)
nRBC: 0 % (ref 0.0–0.2)

## 2018-09-08 LAB — GLUCOSE, CAPILLARY
Glucose-Capillary: 165 mg/dL — ABNORMAL HIGH (ref 70–99)
Glucose-Capillary: 183 mg/dL — ABNORMAL HIGH (ref 70–99)
Glucose-Capillary: 150 mg/dL — ABNORMAL HIGH (ref 70–99)

## 2018-09-08 MED ORDER — APIXABAN 5 MG PO TABS
5.0000 mg | ORAL_TABLET | Freq: Two times a day (BID) | ORAL | Status: DC
  Administered 2018-09-09 – 2018-09-13 (×9): 5 mg via ORAL
  Filled 2018-09-08 (×9): qty 1

## 2018-09-08 MED ORDER — METRONIDAZOLE 500 MG PO TABS
500.0000 mg | ORAL_TABLET | Freq: Three times a day (TID) | ORAL | Status: DC
  Administered 2018-09-08 – 2018-09-13 (×15): 500 mg via ORAL
  Filled 2018-09-08 (×15): qty 1

## 2018-09-08 NOTE — Plan of Care (Signed)
  Problem: Acute Rehab PT Goals(only PT should resolve) Goal: Pt Will Go Supine/Side To Sit Outcome: Progressing Flowsheets (Taken 09/08/2018 1209) Pt will go Supine/Side to Sit: with supervision Goal: Patient Will Transfer Sit To/From Stand Outcome: Progressing Flowsheets (Taken 09/08/2018 1209) Patient will transfer sit to/from stand: with minimal assist Goal: Pt Will Transfer Bed To Chair/Chair To Bed Outcome: Progressing Flowsheets (Taken 09/08/2018 1209) Pt will Transfer Bed to Chair/Chair to Bed: with min assist Goal: Pt Will Ambulate Outcome: Progressing Flowsheets (Taken 09/08/2018 1209) Pt will Ambulate: 15 feet; with moderate assist; with rolling walker   12:09 PM, 09/08/18 Lonell Grandchild, MPT Physical Therapist with [facility] [phone] office [phone] mobile phone

## 2018-09-08 NOTE — Progress Notes (Signed)
PROGRESS NOTE   [name]  ZWC:[id number] DOB: February 18, 1951 DOA: 09/02/2018 PCP: [name], MD   Brief Narrative:  68 year old female admitted to the hospital with abdominal pain and bloody stools.  CT scan showed evidence of colitis.  She is chronically on Eliquis which was reversed in the emergency room.  She was started on intravenous antibiotics.  She is currently being observed and hemoglobin has been stable.  She was seen by gastroenterology with plans for possible endoscopic evaluation on 1/20.  Assessment & Plan:   Principal Problem:   GI bleed Active Problems:   Diabetes type 2, uncontrolled (HCC)   HTN (hypertension)   Depression   Unspecified atrial fibrillation (HCC)   Chronic diastolic CHF (congestive heart failure) (HCC)   CKD (chronic kidney disease), stage III (HCC)   Hypokalemia   COPD (chronic obstructive pulmonary disease) (HCC)   Coagulopathy (HCC)   Colitis   Lower abdominal pain   Rectal bleeding   Gastritis due to nonsteroidal anti-inflammatory drug   Esophageal dysphagia   Hypotension due to hypovolemia  1. Rectal bleeding.  Felt to be related to colitis in the setting of anticoagulation.  Hemoglobin has been stable.  Seen by GI and underwent EGD with Schatzki ring and mild gastritis reported. Plans are for outpatient colonoscopy.  Continue to follow hemoglobin and treat supportively.   Resume apixaban 1/23.  2. Chronic blood loss Anemia. Hg stable.  Continue to monitor hemoglobin.  So far it has been holding stable.  3. Colitis.  Currently on intravenous antibiotics.   No vomiting.  Tolerating diet. 4. Atrial fibrillation.  Was previously on Eliquis, which was reversed in the emergency room due to GI bleeding. Heart rate has been trending up, and she has been restarted on beta blockers.  Holding diltiazem due to hypotension. This may need to be titrated up based on heart rate. Limitations may be blood pressure. 5. 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  Per GI, restart eliquis on 1/23. 6. Type 2 diabetes mellitus.  Initially had hypoglycemia on admission.  This is since improved.  Blood sugars now stable on sliding scale.  Continue to follow. 7. Hypertension.  Blood pressures stable.  Continue to monitor. 8. Chronic diastolic heart failure.  Appears compensated.  Continue to monitor. 9. Depression.  Continue on trazodone and bupropion. 10. COPD with chronic respiratory failure.  Continue on supplemental oxygen and bronchodilators as needed.  No wheezing at this time.  Follow clinically.   DVT prophylaxis: SCDs Code Status: Full code Family Communication: Discussed with patient, no family present Disposition Plan: Anticipating discharge back to nursing facility on 1/24   Consultants:   Gastroenterology  Procedures:  EGD:  - Moderate Schatzki ring. Dilated.                           - MILD Gastritis. Biopsied.                      - Non-bleeding duodenal diverticulum  Antimicrobials:   Ceftriaxone 1/17 >  Flagyl 1/17 >  Subjective: Pt denies any recurrent bleeding. Pt says she is feeling better.    Objective: Vitals:   09/07/18 1700 09/07/18 2250 09/08/18 0700 09/08/18 1323  BP:  (!) 117/58 114/66 127/61  Pulse:  89 87 64  Resp:  20 19 18   Temp: 98.2 F (36.8 C) 98 F (36.7 C) 97.8 F (36.6 C) (!) 97.5 F (36.4  C)  TempSrc:  Oral  Oral  SpO2:  100% 99% 99%  Weight:      Height:        Intake/Output Summary (Last 24 hours) at 09/08/2018 1741 Last data filed at 09/08/2018 0700 Gross per 24 hour  Intake 420.07 ml  Output 200 ml  Net 220.07 ml   Filed Weights   09/04/18 0519 09/06/18 0400 09/07/18 0500  Weight: 103.9 kg 107.2 kg 107.8 kg   Examination:  General exam: Alert, awake, oriented x 3 Respiratory system: Clear to auscultation. Respiratory effort normal. Cardiovascular system: irregularly irregular. No murmurs, rubs, gallops. Gastrointestinal system: Abdomen is nondistended, soft and  nontender. No organomegaly or masses felt. Normal bowel sounds heard. Central nervous system: Alert and oriented. No focal neurological deficits. Extremities: No C/C/E, +pedal pulses Skin: No rashes, lesions or ulcers Psychiatry: Judgement and insight appear normal. Mood & affect appropriate.   Data Reviewed: I have personally reviewed following labs and imaging studies  CBC: Recent Labs  Lab 09/04/18 0433 09/05/18 0451 09/06/18 0621 09/07/18 0443 09/08/18 0526  WBC 7.6 6.8 6.6 5.9 6.5  NEUTROABS 5.1 4.4 4.0 3.3 3.7  HGB 9.2* 8.7* 8.4* 8.7* 8.1*  HCT 31.3* 30.2* 29.4* 31.4* 28.4*  MCV 94.6 93.8 94.2 97.5 93.7  PLT 232 286 295 264 502   Basic Metabolic Panel: Recent Labs  Lab 09/03/18 1054 09/04/18 0433 09/05/18 0451 09/06/18 0621 09/07/18 0443  NA 138 137 135 136 137  K 3.6 3.6 3.5 3.7 4.1  CL 100 104 102 100 105  CO2 26 23 26 26 23   GLUCOSE 91 171* 175* 165* 169*  BUN 34* 30* 24* 21 17  CREATININE 1.08* 1.03* 0.96 0.92 0.83  CALCIUM 7.6* 7.6* 7.9* 8.2* 8.4*   GFR: Estimated Creatinine Clearance: 78.8 mL/min (by C-G formula based on SCr of 0.83 mg/dL). Liver Function Tests: Recent Labs  Lab 09/02/18 2335  AST 19  ALT 12  ALKPHOS 81  BILITOT 0.9  PROT 6.1*  ALBUMIN 2.7*   No results for input(s): LIPASE, AMYLASE in the last 168 hours. No results for input(s): AMMONIA in the last 168 hours. Coagulation Profile: Recent Labs  Lab 09/02/18 2335  INR 1.94   Cardiac Enzymes: No results for input(s): CKTOTAL, CKMB, CKMBINDEX, TROPONINI in the last 168 hours. BNP (last 3 results) No results for input(s): PROBNP in the last 8760 hours. HbA1C: No results for input(s): HGBA1C in the last 72 hours. CBG: Recent Labs  Lab 09/07/18 1124 09/07/18 1618 09/08/18 0847 09/08/18 1305 09/08/18 1708  GLUCAP 170* 226* 165* 183* 150*   Lipid Profile: No results for input(s): CHOL, HDL, LDLCALC, TRIG, CHOLHDL, LDLDIRECT in the last 72 hours. Thyroid Function  Tests: No results for input(s): TSH, T4TOTAL, FREET4, T3FREE, THYROIDAB in the last 72 hours. Anemia Panel: No results for input(s): VITAMINB12, FOLATE, FERRITIN, TIBC, IRON, RETICCTPCT in the last 72 hours. Sepsis Labs: No results for input(s): PROCALCITON, LATICACIDVEN in the last 168 hours.  No results found for this or any previous visit (from the past 240 hour(s)).   Radiology Studies: No results found.  Scheduled Meds: . [START ON 09/09/2018] apixaban  5 mg Oral BID  . buPROPion  300 mg Oral Daily  . chlorhexidine  15 mL Mouth Rinse BID  . escitalopram  10 mg Oral Daily  . famotidine  20 mg Oral BID  . insulin aspart  0-9 Units Subcutaneous TID WC  . mouth rinse  15 mL Mouth Rinse q12n4p  . metoprolol  tartrate  25 mg Oral BID  . nystatin   Topical TID  . pantoprazole  40 mg Oral QAC breakfast  . traZODone  25 mg Oral QHS  . umeclidinium bromide  1 puff Inhalation Daily   Continuous Infusions: . cefTRIAXone (ROCEPHIN)  IV Stopped (09/08/18 0501)  . metronidazole 500 mg (09/08/18 1033)     LOS: 3 days   Time spent: 55 mins  Jyla Hopf Wynetta Emery, MD [facility]  If 7PM-7AM, please contact night-coverage [email or web address]  09/08/2018, 5:41 PM

## 2018-09-08 NOTE — Evaluation (Signed)
Physical Therapy Evaluation Patient Details Name: [name] MRN: [id number] DOB: [birth year] Today's Date: 09/08/2018   History of Present Illness  [name] is a 68 y.o. female with medical history significant of chronic atrial fibrillation, chronic diastolic CHF, depression, type 2 diabetes, history of left ventricle thrombus, hypertension, morbid obesity, osteoporosis, UTI who is coming from the [facility] where she has been admitted for rehabilitation after having fractures on both of her lower extremities in August and December.  She was recently admitted to the hospital first with acute on chronic diastolic CHF and 6 days later was admitted due to dehydration who is coming to the emergency department due to hematochezia.    Clinical Impression  Patient limited for functional mobility as stated below secondary to BLE weakness, fatigue and poor standing balance.  Patient limited to a few side steps at bedside with labored movement, near loss of balance and tolerated sitting up in chair after therapy.  Patient on room air throughout treatment with O2 saturation at 100% - RN notified.   Patient will benefit from continued physical therapy in hospital and recommended venue below to increase strength, balance, endurance for safe ADLs and gait.    Follow Up Recommendations SNF    Equipment Recommendations       Recommendations for Other Services       Precautions / Restrictions Precautions Precautions: Fall Restrictions Weight Bearing Restrictions: No      Mobility  Bed Mobility Overal bed mobility: Needs Assistance Bed Mobility: Supine to Sit     Supine to sit: Min guard     General bed mobility comments: increased time, use of bedrail  Transfers Overall transfer level: Needs assistance Equipment used: Rolling walker (2 wheeled) Transfers: Sit to/from [facility] Sit to Stand: Mod assist Stand pivot transfers: Mod assist       General  transfer comment: had difficulty with sit to stands due to BLE weakness  Ambulation/Gait Ambulation/Gait assistance: Mod assist;Max assist Gait Distance (Feet): 4 Feet Assistive device: Rolling walker (2 wheeled) Gait Pattern/deviations: Decreased step length - right;Decreased step length - left;Decreased stride length Gait velocity: slow   General Gait Details: limited to 7-8 slow unsteady side steps with near loss of balance due to BLE weakness  Stairs            Wheelchair Mobility    Modified Rankin (Stroke Patients Only)       Balance Overall balance assessment: Needs assistance Sitting-balance support: Feet supported;No upper extremity supported Sitting balance-Leahy Scale: Good     Standing balance support: Bilateral upper extremity supported;During functional activity Standing balance-Leahy Scale: Fair Standing balance comment: using RW                             Pertinent Vitals/Pain Pain Assessment: 0-10 Pain Score: 8  Pain Location: upper mid stomach Pain Descriptors / Indicators: Aching Pain Intervention(s): Limited activity within patient's tolerance;Monitored during session    [place] expects to be discharged to:: Private residence Living Arrangements: Spouse/significant other Available Help at Discharge: Family;Available PRN/intermittently Type of Home: House Home Access: Ramped entrance     Home Layout: One level Home Equipment: Walker - standard;Bedside commode;Grab bars - tub/shower;Shower seat;Wheelchair - manual;Tub bench;Walker - 2 wheels      Prior Function Level of Independence: Needs assistance   Gait / Transfers Assistance Needed: Assisted for bed mobility, transfers to wheelchair using RW, and non-ambulatory since bilateral  ankle fractures, uses hoyar lift occasionlly to be put back to bed after sitting up in w/c  ADL's / Homemaking Assistance Needed: assisted by spouse        Hand Dominance    Dominant Hand: Right    Extremity/Trunk Assessment   Upper Extremity Assessment Upper Extremity Assessment: Generalized weakness    Lower Extremity Assessment Lower Extremity Assessment: Generalized weakness    Cervical / Trunk Assessment Cervical / Trunk Assessment: Normal  Communication   Communication: No difficulties  Cognition Arousal/Alertness: Awake/alert Behavior During Therapy: WFL for tasks assessed/performed Overall Cognitive Status: Within Functional Limits for tasks assessed                                        General Comments      Exercises General Exercises - Lower Extremity Long Arc Quad: Seated;AROM;Strengthening;Both;5 reps Hip Flexion/Marching: Seated;Strengthening;AROM;Both;5 reps Toe Raises: Seated;Strengthening;AROM;Both;5 reps Heel Raises: Seated;AROM;Strengthening;Both;5 reps   Assessment/Plan    PT Assessment Patient needs continued PT services  PT Problem List Decreased strength;Decreased activity tolerance;Decreased balance;Decreased mobility       PT Treatment Interventions Gait training;Functional mobility training;Therapeutic activities;Patient/family education;Therapeutic exercise    PT Goals (Current goals can be found in the Care Plan section)  Acute Rehab PT Goals Patient Stated Goal: return home after rehab Time For Goal Achievement: 09/22/18 Potential to Achieve Goals: Good    Frequency Min 3X/week   Barriers to discharge        Co-evaluation               AM-PAC PT "6 Clicks" Mobility  Outcome Measure Help needed turning from your back to your side while in a flat bed without using bedrails?: None Help needed moving from lying on your back to sitting on the side of a flat bed without using bedrails?: A Little Help needed moving to and from a bed to a chair (including a wheelchair)?: A Lot Help needed standing up from a chair using your arms (e.g., wheelchair or bedside chair)?: A Lot Help needed  to walk in hospital room?: A Lot Help needed climbing 3-5 steps with a railing? : Total 6 Click Score: 14    End of Session   Activity Tolerance: Patient tolerated treatment well Patient left: in chair;with call bell/phone within reach Nurse Communication: Mobility status PT Visit Diagnosis: Unsteadiness on feet (R26.81);Other abnormalities of gait and mobility (R26.89);Muscle weakness (generalized) (M62.81)    Time: 0626-9485 PT Time Calculation (min) (ACUTE ONLY): 33 min   Charges:   PT Evaluation $PT Eval Moderate Complexity: 1 Mod PT Treatments $Therapeutic Exercise: 8-22 mins        12:06 PM, 09/08/18 Lonell Grandchild, MPT Physical Therapist with [facility] [phone] office [phone] mobile phone

## 2018-09-09 ENCOUNTER — Encounter (HOSPITAL_COMMUNITY): Payer: Self-pay | Admitting: Family Medicine

## 2018-09-09 ENCOUNTER — Inpatient Hospital Stay (HOSPITAL_COMMUNITY): Payer: BLUE CROSS/BLUE SHIELD

## 2018-09-09 DIAGNOSIS — E876 Hypokalemia: Secondary | ICD-10-CM

## 2018-09-09 LAB — CBC WITH DIFFERENTIAL/PLATELET
Abs Immature Granulocytes: 0.09 10*3/uL — ABNORMAL HIGH (ref 0.00–0.07)
Basophils Absolute: 0 10*3/uL (ref 0.0–0.1)
Basophils Relative: 0 %
EOS PCT: 4 %
Eosinophils Absolute: 0.3 10*3/uL (ref 0.0–0.5)
HCT: 27.7 % — ABNORMAL LOW (ref 36.0–46.0)
Hemoglobin: 7.9 g/dL — ABNORMAL LOW (ref 12.0–15.0)
Immature Granulocytes: 1 %
Lymphocytes Relative: 20 %
Lymphs Abs: 1.4 10*3/uL (ref 0.7–4.0)
MCH: 26.8 pg (ref 26.0–34.0)
MCHC: 28.5 g/dL — ABNORMAL LOW (ref 30.0–36.0)
MCV: 93.9 fL (ref 80.0–100.0)
MONO ABS: 0.8 10*3/uL (ref 0.1–1.0)
MONOS PCT: 11 %
Neutro Abs: 4.4 10*3/uL (ref 1.7–7.7)
Neutrophils Relative %: 64 %
Platelets: 283 10*3/uL (ref 150–400)
RBC: 2.95 MIL/uL — ABNORMAL LOW (ref 3.87–5.11)
RDW: 15.9 % — ABNORMAL HIGH (ref 11.5–15.5)
WBC: 6.9 10*3/uL (ref 4.0–10.5)
nRBC: 0 % (ref 0.0–0.2)

## 2018-09-09 LAB — GLUCOSE, CAPILLARY
Glucose-Capillary: 224 mg/dL — ABNORMAL HIGH (ref 70–99)
Glucose-Capillary: 131 mg/dL — ABNORMAL HIGH (ref 70–99)

## 2018-09-09 MED ORDER — DILTIAZEM HCL ER COATED BEADS 120 MG PO CP24
120.0000 mg | ORAL_CAPSULE | Freq: Every day | ORAL | Status: DC
  Administered 2018-09-09 – 2018-09-13 (×5): 120 mg via ORAL
  Filled 2018-09-09 (×5): qty 1

## 2018-09-09 MED ORDER — GADOBUTROL 1 MMOL/ML IV SOLN
10.0000 mL | Freq: Once | INTRAVENOUS | Status: AC | PRN
  Administered 2018-09-09: 10 mL via INTRAVENOUS

## 2018-09-09 NOTE — Plan of Care (Signed)
  Problem: Clinical Measurements: Goal: Ability to maintain clinical measurements within normal limits will improve Outcome: Progressing Goal: Will remain free from infection Outcome: Progressing Goal: Cardiovascular complication will be avoided Outcome: Progressing   Problem: Activity: Goal: Risk for activity intolerance will decrease Outcome: Progressing   Problem: Safety: Goal: Ability to remain free from injury will improve Outcome: Progressing   Problem: Skin Integrity: Goal: Risk for impaired skin integrity will decrease Outcome: Progressing   Problem: Clinical Measurements: Goal: Complications related to the disease process, condition or treatment will be avoided or minimized Outcome: Progressing

## 2018-09-09 NOTE — Progress Notes (Addendum)
Subjective:  Feel better overall. Doesn't really like food here. Wants biscuits and gravy. Some vague upper abdominal burning/cramping. No blood in stool. She reports a BM yesterday.   Objective: Vital signs in last 24 hours: Temp:  [97.5 F (36.4 C)-99.2 F (37.3 C)] 98.1 F (36.7 C) (01/23 0454) Pulse Rate:  [64-125] 113 (01/23 0454) Resp:  [18-20] 18 (01/23 0454) BP: (118-127)/(61-80) 124/71 (01/23 0454) SpO2:  [92 %-100 %] 92 % (01/23 0730) Last BM Date: 09/04/18 General:   Alert,  Well-developed, well-nourished, pleasant and cooperative in NAD Head:  Normocephalic and atraumatic. Eyes:  Sclera clear, no icterus.  Abdomen:  Soft,  nondistended. Normal bowel sounds, without guarding, and without rebound.  Mild upper abd tenderness Extremities:  Without clubbing, deformity or edema. Neurologic:  Alert and  oriented x4;  grossly normal neurologically. Skin:  Intact without significant lesions or rashes. Psych:  Alert and cooperative. Normal mood and affect.  Intake/Output from previous day: 01/22 0701 - 01/23 0700 In: 720 [P.O.:720] Out: 1500 [Urine:1500] Intake/Output this shift: No intake/output data recorded.  Lab Results: CBC Recent Labs    09/07/18 0443 09/08/18 0526 09/09/18 0628  WBC 5.9 6.5 6.9  HGB 8.7* 8.1* 7.9*  HCT 31.4* 28.4* 27.7*  MCV 97.5 93.7 93.9  PLT 264 291 283   BMET Recent Labs    09/07/18 0443  NA 137  K 4.1  CL 105  CO2 23  GLUCOSE 169*  BUN 17  CREATININE 0.83  CALCIUM 8.4*   LFTs No results for input(s): BILITOT, BILIDIR, IBILI, ALKPHOS, AST, ALT, PROT, ALBUMIN in the last 72 hours. No results for input(s): LIPASE in the last 72 hours. PT/INR No results for input(s): LABPROT, INR in the last 72 hours.    Imaging Studies: Dg Chest 2 View  Result Date: 08/16/2018 CLINICAL DATA:  Acute cough and shortness of breath. EXAM: CHEST - 2 VIEW COMPARISON:  08/10/2018 and prior radiographs FINDINGS: Cardiomegaly again noted. RIGHT  pleural effusion and RIGHT basilar atelectasis again noted with slightly improved RIGHT basilar aeration. There is no evidence of pneumothorax. No acute bony abnormalities identified. IMPRESSION: Slightly improved RIGHT basilar aeration since 08/10/2018 with continued RIGHT pleural effusion and RIGHT basilar atelectasis. Cardiomegaly Electronically Signed   By: Margarette Canada M.D.   On: 08/16/2018 18:51   Ct Angio Chest Pe W/cm &/or Wo Cm  Result Date: 08/10/2018 CLINICAL DATA:  Shortness of breath EXAM: CT ANGIOGRAPHY CHEST WITH CONTRAST TECHNIQUE: Multidetector CT imaging of the chest was performed using the standard protocol during bolus administration of intravenous contrast. Multiplanar CT image reconstructions and MIPs were obtained to evaluate the vascular anatomy. CONTRAST:  122m ISOVUE-370 IOPAMIDOL (ISOVUE-370) INJECTION 76% COMPARISON:  Chest CT July 28, 2013; chest radiograph August 10, 2018 FINDINGS: Cardiovascular: There is no demonstrable pulmonary embolus. There is no thoracic aortic aneurysm or dissection. The visualized great vessels appear unremarkable. There is aortic atherosclerosis. There are foci of coronary artery calcification. There is no pericardial effusion or pericardial thickening. Mediastinum/Nodes: There is a mass arising in the right lobe of the thyroid measuring 2.0 x 0.9 cm. There are subcentimeter mediastinal lymph nodes. There is no adenopathy by size criteria in the thoracic region. No esophageal lesions are evident. Lungs/Pleura: There is a sizable free-flowing pleural effusion on the right. There is consolidation throughout much of the right lower lobe. There is atelectatic change in a portion of the posterior segment right upper lobe. There is atelectatic change in the left base. There is mild  lower lobe bronchiectatic change on the left. Upper Abdomen: Gallbladder is absent. There is abdominal aortic and splenic artery atherosclerosis. Visualized upper abdominal  structures otherwise appear unremarkable. Musculoskeletal: There is degenerative change in the thoracic spine. There is anterior wedging of the T6 vertebral body, also present on previous study. There are no blastic or lytic bone lesions. No evident chest wall lesions. Review of the MIP images confirms the above findings. IMPRESSION: 1. No demonstrable pulmonary embolus. No thoracic aortic aneurysm or dissection. There is aortic atherosclerosis. There are foci of coronary artery calcification. 2. Sizable free-flowing right pleural effusion with consolidation throughout much of the right lower lobe. There is atelectatic change in the posterior segment of the right upper lobe. 3. Atelectatic change left base. Mild bronchiectasis left lower lobe. 4. **An incidental finding of potential clinical significance has been found. 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 Consider further evaluation with thyroid ultrasound nonemergently. If patient is clinically hyperthyroid, consider nuclear medicine thyroid uptake and scan.** 5.  No evident thoracic adenopathy. 6.  Gallbladder absent. Aortic Atherosclerosis (ICD10-I70.0). Electronically Signed   By: Lowella Grip III M.D.   On: 08/10/2018 11:18   Ct Abdomen Pelvis W Contrast  Result Date: 09/03/2018 CLINICAL DATA:  68 year old female with left-sided abdominal pain and rectal bleeding. EXAM: CT ABDOMEN AND PELVIS WITH CONTRAST TECHNIQUE: Multidetector CT imaging of the abdomen and pelvis was performed using the standard protocol following bolus administration of intravenous contrast. CONTRAST:  133m ISOVUE-300 IOPAMIDOL (ISOVUE-300) INJECTION 61% COMPARISON:  Renal ultrasound dated 04/05/2018. FINDINGS: Lower chest: There is a small right pleural effusion with associated right lung base partial atelectasis. Pneumonia is not excluded. Clinical correlation is recommended. There is mild cardiomegaly. There is calcification of the mitral annulus. No  intra-abdominal free air or free fluid. Hepatobiliary: The liver is unremarkable. Cholecystectomy. There is dilatation of the common bile duct measuring up to 19 mm. No calcified gallstone. Pancreas: The pancreas is poorly visualized and appears atrophic with fatty infiltration. 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 Further evaluation with MRI on a nonemergent basis recommended. Spleen: Normal in size without focal abnormality. Adrenals/Urinary Tract: The adrenal glands are unremarkable. Mild renal parenchyma atrophy and cortical irregularity and thinning. Subcentimeter left renal hypodense lesions are too small to characterize. There is no hydronephrosis on either side. There is symmetric enhancement and excretion of contrast by both kidneys. The visualized ureters appear unremarkable. Minimal thickened appearance of the bladder wall, likely related to underdistention. Stomach/Bowel: Loose stool throughout the colon compatible with diarrheal state. There is diffuse enhancement of the colonic mucosa most consistent with colitis. There is sigmoid diverticulosis without active inflammatory changes. There is no bowel obstruction. The appendix is not identified with certainty and likely surgically absent. Vascular/Lymphatic: There is moderate aortoiliac atherosclerotic disease. No portal venous gas. There is no adenopathy. Reproductive: Hysterectomy. There is a 4.5 x 4.2 cm multicystic lesion in the right ovary. Further evaluation with ultrasound on a nonemergent basis recommended. Depending on the ultrasound finding additional evaluation with MRI the required. A 1.3 cm rounded nodular density in the left anterior pelvis (series 3, image 67) likely represents a lymph node. Other: Mild diffuse subcutaneous edema. No fluid collection. Musculoskeletal: Degenerative changes of  the spine and osteopenia. Partially visualized left femoral fixation rod. No acute osseous pathology. Grade  1 L4-L5 anterolisthesis. IMPRESSION: 1. Diarrheal state with findings of colitis. Correlation with clinical exam and stool cultures recommended. No bowel obstruction. 2. Sigmoid diverticulosis without active inflammatory changes. 3. Cystic lesion in the region of the body of the pancreas. Further evaluation with MRI on a nonemergent basis recommended. 4. Right ovarian multicystic lesion. Further evaluation with pelvic ultrasound recommended. 5. Small right pleural effusion with associated right lung base partial atelectasis. Pneumonia is not excluded. Clinical correlation is recommended. Electronically Signed   By: Anner Crete M.D.   On: 09/03/2018 02:15   Dg Chest Port 1 View  Result Date: 08/10/2018 CLINICAL DATA:  Shortness of breath, palpitations EXAM: PORTABLE CHEST 1 VIEW COMPARISON:  08/09/2018 FINDINGS: Cardiomegaly with vascular congestion. Right lower lobe airspace opacity with probable layering right effusion. No confluent opacity on the left. No acute bony abnormality. IMPRESSION: Cardiomegaly with vascular congestion. Layering right effusion with right lower lobe atelectasis or infiltrate, unchanged. Electronically Signed   By: Rolm Baptise M.D.   On: 08/10/2018 08:17   Dg Esophagus Inc Scout Chest & Delayed Img Single Cm (ba Or Sol)  Result Date: 08/23/2018 CLINICAL DATA:  68 year old female with history of dysphagia. EXAM: ESOPHOGRAM/BARIUM SWALLOW TECHNIQUE: Single contrast examination was performed using  thin barium. FLUOROSCOPY TIME:  Fluoroscopy Time:  1 minutes Radiation Exposure Index (if provided by the fluoroscopic device): 6.6 mGy COMPARISON:  None. FINDINGS: Examination was severely limited by lack of patient mobility. Patient was imaged in a semi upright supine position with thin barium. Repeated swallows demonstrated intermittent failure to fully propagate a normal  primary peristaltic wave, with numerous tertiary contractions. No definite esophageal mass, stricture or esophageal ring. No definite hiatal hernia. A barium tablet was administered, but the patient was unable to swallow the tablet. IMPRESSION: 1. Limited examination, as above. Today's study demonstrates nonspecific esophageal motility disorder with intermittent tertiary contractions. Electronically Signed   By: Vinnie Langton M.D.   On: 08/23/2018 15:48  [2 weeks]   Assessment:  68 year old female with multiple comorbidities, presenting with abdominal pain and rectal bleeding in the setting of chronic Eliquis, held since admission.  Anemia: Hemoglobin stable.  EGD completed this admission with moderate Schatzki ring status post dilation, mild gastritis status post biopsy (chronic inactive gastritis without H. pylori), nonbleeding duodenal diverticulum.  Discussed biopsy findings with patient.  Colitis: Clinically improved without further rectal bleeding.  Complete 10-day course of antibiotics.  Eliquis to be restarted today.  No stool studies done, no overt diarrhea.  Plan for outpatient colonoscopy  Pancreatic lesion on CT, dilated common bile duct: Outpatient follow-up with further imaging  Right ovarian multicystic lesion: Follow-up as an outpatient with PCP or GYN  Plan: 1. Complete 10 days of antibiotics. 2. Okay to restart Eliquis today. 3. Colonoscopy in 4 weeks as an outpatient. 4. Pantoprazole 40 mg daily before breakfast. 5. Outpatient follow-up of pancreatic lesion on CT, dilated common bile duct. 6. Outpatient follow-up of ovarian lesions per PCP or GYN. 7. Will sign off.  Call with any questions.  Laureen Ochs. Bernarda Caffey [facility] [phone] 1/23/202012:59 PM        LOS: 4 days

## 2018-09-09 NOTE — Progress Notes (Signed)
CC'D TO PCP °

## 2018-09-09 NOTE — Progress Notes (Signed)
PROGRESS NOTE   [name]  ZGY:[id number] DOB: 19-Jul-1951 DOA: 09/02/2018 PCP: [name], MD   Brief Narrative:  69 year old female admitted to the hospital with abdominal pain and bloody stools.  CT scan showed evidence of colitis.  She is chronically on Eliquis which was reversed in the emergency room.  She was started on intravenous antibiotics.  She is currently being observed and hemoglobin has been stable.  She was seen by gastroenterology with plans for possible endoscopic evaluation on 1/20.  Assessment & Plan:   Principal Problem:   GI bleed Active Problems:   Diabetes type 2, uncontrolled (HCC)   HTN (hypertension)   Depression   Unspecified atrial fibrillation (HCC)   Chronic diastolic CHF (congestive heart failure) (HCC)   CKD (chronic kidney disease), stage III (HCC)   Hypokalemia   COPD (chronic obstructive pulmonary disease) (HCC)   Coagulopathy (HCC)   Colitis   Lower abdominal pain   Rectal bleeding   Gastritis due to nonsteroidal anti-inflammatory drug   Esophageal dysphagia   Hypotension due to hypovolemia  1. Rectal bleeding.  Felt to be related to colitis in the setting of anticoagulation.  Hemoglobin has been stable.  Seen by GI and underwent EGD with Schatzki ring and mild gastritis reported. Plans are for outpatient colonoscopy.  Continue to follow hemoglobin and treat supportively.   Resume apixaban 1/23.  2. Chronic blood loss Anemia. Follow Hg. It is down to 7.9 today.  Continue to monitor hemoglobin.  3. Colitis.  Complete 10 days of antibiotics per GI team.   No vomiting.  Tolerating diet. 4. Atrial fibrillation.  Resuming apixaban 1/23 for full anticoagulation. She has been restarted on beta blockers. Resume diltiazem 120 mg. This may need to be titrated up based on heart rate. Limitations may be blood pressure. 5. 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  Per GI, restart eliquis on 1/23. 6. Cystic lesion on thyroid - obtain  thyroid US to evaluate.  7. Cystic lesion on right ovary - obtain pelvic US.  8. Cystic lesion on pancreas - radiology recommending MRI. MRCP ordered.   9. Type 2 diabetes mellitus.  Initially had hypoglycemia on admission.  This is since improved.  Blood sugars now stable on sliding scale.  Continue to follow. 10. Hypertension.  Blood pressures stable.  Continue to monitor. 11. Chronic diastolic heart failure.  Appears compensated.  Continue to monitor. 12. Depression.  Continue on trazodone and bupropion. 13. COPD with chronic respiratory failure.  Continue on supplemental oxygen and bronchodilators as needed.  No wheezing at this time.  Follow clinically.   DVT prophylaxis: SCDs Code Status: Full code Family Communication: Discussed with patient, no family present Disposition Plan: Anticipating discharge back to nursing facility on 1/24   Consultants:   Gastroenterology  Procedures:  EGD:  - Moderate Schatzki ring. Dilated.                           - MILD Gastritis. Biopsied.                      - Non-bleeding duodenal diverticulum  Antimicrobials:   Ceftriaxone 1/17 >  Flagyl 1/17 >  Subjective: Pt denies any recurrent bleeding. Pt says she is feeling better.  Tolerating diet well.      Objective: Vitals:   09/08/18 1323 09/08/18 2226 09/09/18 0454 09/09/18 0730  BP: 127/61 118/80 124/71   Pulse: 64 (!) 125 Marland Kitchen)  113   Resp: 18 20 18    Temp: (!) 97.5 F (36.4 C) 99.2 F (37.3 C) 98.1 F (36.7 C)   TempSrc: Oral Oral Oral   SpO2: 99% 97% 100% 92%  Weight:      Height:        Intake/Output Summary (Last 24 hours) at 09/09/2018 1252 Last data filed at 09/09/2018 0900 Gross per 24 hour  Intake 600 ml  Output 1500 ml  Net -900 ml   Filed Weights   09/04/18 0519 09/06/18 0400 09/07/18 0500  Weight: 103.9 kg 107.2 kg 107.8 kg   Examination:  General exam: Alert, awake, oriented x 3 Respiratory system: Clear to auscultation. Respiratory effort  normal. Cardiovascular system: irregularly irregular. No murmurs, rubs, gallops. Gastrointestinal system: Abdomen is nondistended, soft and nontender. No organomegaly or masses felt. Normal bowel sounds heard. Central nervous system: Alert and oriented. No focal neurological deficits. Extremities: No C/C/E, +pedal pulses Skin: No rashes, lesions or ulcers Psychiatry: Judgement and insight appear normal. Mood & affect appropriate.   Data Reviewed: I have personally reviewed following labs and imaging studies  CBC: Recent Labs  Lab 09/05/18 0451 09/06/18 0621 09/07/18 0443 09/08/18 0526 09/09/18 0628  WBC 6.8 6.6 5.9 6.5 6.9  NEUTROABS 4.4 4.0 3.3 3.7 4.4  HGB 8.7* 8.4* 8.7* 8.1* 7.9*  HCT 30.2* 29.4* 31.4* 28.4* 27.7*  MCV 93.8 94.2 97.5 93.7 93.9  PLT 286 295 264 291 607   Basic Metabolic Panel: Recent Labs  Lab 09/03/18 1054 09/04/18 0433 09/05/18 0451 09/06/18 0621 09/07/18 0443  NA 138 137 135 136 137  K 3.6 3.6 3.5 3.7 4.1  CL 100 104 102 100 105  CO2 26 23 26 26 23   GLUCOSE 91 171* 175* 165* 169*  BUN 34* 30* 24* 21 17  CREATININE 1.08* 1.03* 0.96 0.92 0.83  CALCIUM 7.6* 7.6* 7.9* 8.2* 8.4*   GFR: Estimated Creatinine Clearance: 78.8 mL/min (by C-G formula based on SCr of 0.83 mg/dL). Liver Function Tests: Recent Labs  Lab 09/02/18 2335  AST 19  ALT 12  ALKPHOS 81  BILITOT 0.9  PROT 6.1*  ALBUMIN 2.7*   No results for input(s): LIPASE, AMYLASE in the last 168 hours. No results for input(s): AMMONIA in the last 168 hours. Coagulation Profile: Recent Labs  Lab 09/02/18 2335  INR 1.94   Cardiac Enzymes: No results for input(s): CKTOTAL, CKMB, CKMBINDEX, TROPONINI in the last 168 hours. BNP (last 3 results) No results for input(s): PROBNP in the last 8760 hours. HbA1C: No results for input(s): HGBA1C in the last 72 hours. CBG: Recent Labs  Lab 09/07/18 1618 09/08/18 0847 09/08/18 1305 09/08/18 1708 09/09/18 1123  GLUCAP 226* 165* 183* 150*  224*   Lipid Profile: No results for input(s): CHOL, HDL, LDLCALC, TRIG, CHOLHDL, LDLDIRECT in the last 72 hours. Thyroid Function Tests: No results for input(s): TSH, T4TOTAL, FREET4, T3FREE, THYROIDAB in the last 72 hours. Anemia Panel: No results for input(s): VITAMINB12, FOLATE, FERRITIN, TIBC, IRON, RETICCTPCT in the last 72 hours. Sepsis Labs: No results for input(s): PROCALCITON, LATICACIDVEN in the last 168 hours.  No results found for this or any previous visit (from the past 240 hour(s)).   Radiology Studies: No results found.  Scheduled Meds: . apixaban  5 mg Oral BID  . buPROPion  300 mg Oral Daily  . chlorhexidine  15 mL Mouth Rinse BID  . escitalopram  10 mg Oral Daily  . famotidine  20 mg Oral BID  . insulin  aspart  0-9 Units Subcutaneous TID WC  . mouth rinse  15 mL Mouth Rinse q12n4p  . metoprolol tartrate  25 mg Oral BID  . metroNIDAZOLE  500 mg Oral Q8H  . nystatin   Topical TID  . pantoprazole  40 mg Oral QAC breakfast  . traZODone  25 mg Oral QHS  . umeclidinium bromide  1 puff Inhalation Daily   Continuous Infusions: . cefTRIAXone (ROCEPHIN)  IV 2 g (09/09/18 0524)     LOS: 4 days   Time spent: 27 mins  Malayja Freund, MD [facility]  If 7PM-7AM, please contact night-coverage [email or web address]  09/09/2018, 12:52 PM

## 2018-09-09 NOTE — Clinical Social Work Note (Signed)
LCSW following. Met with pt again this AM to follow up on dc planning. Pt reports that she does not remember meeting with LCSW on Tuesday of this week. Reviewed information that was shared on Tuesday about return to [facility] at [employer]. Pt is agreeable to return to [facility]. She requests to speak with Gerald Stabs from the [facility] today. Relayed request to Ferney who states he will stop by and see pt this afternoon. Will follow up tomorrow.

## 2018-09-10 DIAGNOSIS — J449 Chronic obstructive pulmonary disease, unspecified: Secondary | ICD-10-CM

## 2018-09-10 LAB — CBC WITH DIFFERENTIAL/PLATELET
Abs Immature Granulocytes: 0.11 10*3/uL — ABNORMAL HIGH (ref 0.00–0.07)
Basophils Absolute: 0 10*3/uL (ref 0.0–0.1)
Basophils Relative: 0 %
EOS PCT: 5 %
Eosinophils Absolute: 0.4 10*3/uL (ref 0.0–0.5)
HCT: 28.9 % — ABNORMAL LOW (ref 36.0–46.0)
Hemoglobin: 8.3 g/dL — ABNORMAL LOW (ref 12.0–15.0)
Immature Granulocytes: 2 %
Lymphocytes Relative: 25 %
Lymphs Abs: 1.7 10*3/uL (ref 0.7–4.0)
MCH: 27.2 pg (ref 26.0–34.0)
MCHC: 28.7 g/dL — AB (ref 30.0–36.0)
MCV: 94.8 fL (ref 80.0–100.0)
MONO ABS: 0.9 10*3/uL (ref 0.1–1.0)
Monocytes Relative: 13 %
Neutro Abs: 3.9 10*3/uL (ref 1.7–7.7)
Neutrophils Relative %: 55 %
Platelets: 301 10*3/uL (ref 150–400)
RBC: 3.05 MIL/uL — ABNORMAL LOW (ref 3.87–5.11)
RDW: 15.9 % — AB (ref 11.5–15.5)
WBC: 6.9 10*3/uL (ref 4.0–10.5)
nRBC: 0 % (ref 0.0–0.2)

## 2018-09-10 LAB — GLUCOSE, CAPILLARY
GLUCOSE-CAPILLARY: 184 mg/dL — AB (ref 70–99)
Glucose-Capillary: 176 mg/dL — ABNORMAL HIGH (ref 70–99)
Glucose-Capillary: 306 mg/dL — ABNORMAL HIGH (ref 70–99)

## 2018-09-10 MED ORDER — METRONIDAZOLE 500 MG PO TABS: 500.0000 mg | ORAL_TABLET | Freq: Three times a day (TID) | ORAL | 0 refills | Status: DC

## 2018-09-10 MED ORDER — METOPROLOL TARTRATE 25 MG PO TABS: 25.0000 mg | ORAL_TABLET | Freq: Two times a day (BID) | ORAL | Status: DC

## 2018-09-10 MED ORDER — PANTOPRAZOLE SODIUM 40 MG PO TBEC: 40.0000 mg | DELAYED_RELEASE_TABLET | Freq: Every day | ORAL | Status: DC

## 2018-09-10 MED ORDER — INSULIN ASPART 100 UNIT/ML ~~LOC~~ SOLN
5.0000 [IU] | Freq: Once | SUBCUTANEOUS | Status: AC
  Administered 2018-09-10: 5 [IU] via SUBCUTANEOUS

## 2018-09-10 MED ORDER — DILTIAZEM HCL ER COATED BEADS 120 MG PO CP24: 120.0000 mg | ORAL_CAPSULE | Freq: Every day | ORAL | Status: DC

## 2018-09-10 MED ORDER — CIPROFLOXACIN HCL 500 MG PO TABS: 500.0000 mg | ORAL_TABLET | Freq: Two times a day (BID) | ORAL | 0 refills | Status: DC

## 2018-09-10 MED ORDER — GUAIFENESIN 400 MG PO TABS: 400.0000 mg | ORAL_TABLET | Freq: Four times a day (QID) | ORAL | Status: DC | PRN

## 2018-09-10 MED ORDER — GUAIFENESIN 400 MG PO TABS: 600.0000 mg | ORAL_TABLET | Freq: Four times a day (QID) | ORAL | Status: DC | PRN

## 2018-09-10 MED ORDER — NYSTATIN 100000 UNIT/GM EX POWD: Freq: Three times a day (TID) | CUTANEOUS | 0 refills | Status: AC

## 2018-09-10 MED ORDER — DICYCLOMINE HCL 10 MG PO CAPS: 20.0000 mg | ORAL_CAPSULE | Freq: Three times a day (TID) | ORAL | Status: DC | PRN

## 2018-09-10 NOTE — Clinical Social Work Note (Signed)
LCSW following. Pt stable for dc today as anticipated. Spoke with Gerald Stabs at [facility] to inquire on insurance authorization. Per Gerald Stabs, they do not yet have the auth. Pt cannot dc until the auth is obtained.   Updated pt's RN who will wait on CSW to notify her before she works on pt's dc.   CSW will follow.

## 2018-09-10 NOTE — Care Management Important Message (Signed)
Important Message  Patient Details  Name: [name] MRN: [id number] Date of Birth: [birth year]   Medicare Important Message Given:  Yes    Sherald Barge, RN 09/10/2018, 2:10 PM

## 2018-09-10 NOTE — Discharge Summary (Signed)
Physician Discharge Summary  [name] DVV:[id number] DOB: 1951/01/22 DOA: 09/02/2018  PCP: Katrina Cha, MD GI: [facility]  Admit date: 09/02/2018 Discharge date: 09/10/2018  Admitted From: SNF Disposition: SNF   Recommendations for Outpatient Follow-up:  1. Follow up with PCP in 2 weeks 2. Follow up with GI in 4 weeks for colonoscopy 3. Follow up with OB/GYN in 2-4 weeks to follow up ovarian cyst 4. Please check CBC in 7-10 days to follow up hemoglobin.   Discharge Condition: STABLE   CODE STATUS: FULL   Brief Hospitalization Summary: Please see all hospital notes, images, labs for full details of the hospitalization. HPI: [name] is a 68 y.o. female with medical history significant of chronic atrial fibrillation, chronic diastolic CHF, depression, type 2 diabetes, history of left ventricle thrombus, hypertension, morbid obesity, osteoporosis, UTI who is coming from the [facility] where she has been admitted for rehabilitation after having fractures on both of her lower extremities in August and December.  She was recently admitted to the hospital first with acute on chronic diastolic CHF and 6 days later was admitted due to dehydration who is coming to the emergency department due to hematochezia.  Per patient, she has been constipated for several days.  She has been nauseous and 2 days ago had a couple episodes of emesis.  Yesterday, Thursday, 09/02/2018, earlier in the day she was given two different laxatives and was unable to have a full bowel movement.  However, while she was in bed, she had incomplete bowel movements with a significant blood amount and maroon stools.  She denies hematemesis, but complains of abdominal pain and nausea.  No dysuria, frequency or hematuria.  She denies recent days fever, chills, but complains of sweats.  She denies dyspnea, sore throat, wheezing, hemoptysis, chest pain, palpitations, dizziness, diaphoresis, PND, orthopnea, but  occasionally gets pitting edema of the lower extremities.  She denies polyuria, polydipsia, polyphagia or blurred vision.  ED Course: Initial vital signs temperature 97.5 F, pulse 86, respirations 22, blood pressure 100/65 mmHg and O2 sat 89% on room air.  In the emergency department she received a 1000 mL NS bolus, ceftriaxone 2 g IVPB, metronidazole 500 mg IVPB and Andexxa.  Lab work reveals a fecal occult blood positive, white count of 11.2 with 83% neutrophils, 8% lymphocytes and 8% monocytes.  Her hemoglobin is 10.1 g/dL, which is slightly higher than her baseline which is normally 9+.  Platelets were 326.  PT was 21.9, INR 1.94 and APTT 39 seconds.  CMP shows a potassium of 3.4 and chloride 97 mmol S/L.  All other electrolytes are within normal limits 1 calcium of 7.8 mg/dL is corrected to an albumin of 2.7 g/dL.  BUN was 37, creatinine 1.07 and glucose 237 mg/dL.  Total protein was also low at 6.1 g/dL.  The rest of the LFTs are within normal limits.  Brief Narrative:  68 year old female admitted to the hospital with abdominal pain and bloody stools.  CT scan showed evidence of colitis.  She is chronically on Eliquis which was reversed in the emergency room.  She was started on intravenous antibiotics.  She is currently being observed and hemoglobin has been stable.  She was seen by gastroenterology with plans for possible endoscopic evaluation on 1/20.  Assessment & Plan:   Principal Problem:   GI bleed Active Problems:   Diabetes type 2, uncontrolled (HCC)   HTN (hypertension)   Depression   Unspecified atrial fibrillation (HCC)   Chronic diastolic  CHF (congestive heart failure) (HCC)   CKD (chronic kidney disease), stage III (HCC)   Hypokalemia   COPD (chronic obstructive pulmonary disease) (HCC)   Coagulopathy (HCC)   Colitis   Lower abdominal pain   Rectal bleeding   Gastritis due to nonsteroidal anti-inflammatory drug   Esophageal dysphagia   Hypotension due to  hypovolemia  1. Rectal bleeding. RESOLVED.  Felt to be related to colitis in the setting of anticoagulation.  Hemoglobin has been stable.  Seen by GI and underwent EGD with Schatzki ring and mild gastritis reported. Plans are for outpatient colonoscopy in 4 weeks.  Resumed apixaban 1/23 and no further bleeding seen.  Hg stable.  Follow up with GI in 4 weeks.   2. Chronic blood loss Anemia. Hg is 8.3 today which is stable.  Recommend checking CBC in 7-10 days for recheck.   3. Colitis.  Complete 10 days of antibiotics per GI team.   No vomiting.  Tolerating diet. 4. Atrial fibrillation.  Resuming apixaban 1/23 for full anticoagulation. She has been restarted on beta blockers. Resumed diltiazem 120 mg. This may need to be titrated up based on heart rate as she recovers. Limitations may be blood pressure. 5. 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  Per GI, restarted eliquis on 1/23. 6. Cystic lesion on thyroid - obtain thyroid US to evaluate as patient reports that she may not be able to follow up on this outpatient.  Small thyroid nodule seen and radiology did not recommend follow up for this nodule.  7. Cystic lesion on right ovary - pelvic US did not show the cyst and was not seen.  I have recommended patient follow up with her Gynecologist to have it checked again in 2-4 weeks.  8. Cystic lesion on pancreas - radiology recommending MRI.  MRCP perfomed 1/23 and no worrisome pancreatic findings.  Pt to follow up with GI in 4 weeks.   9. Type 2 diabetes mellitus.  Initially had hypoglycemia on admission.  This is since improved.  Blood sugars now stable on sliding scale.  Continue to follow. 10. Hypertension.  Blood pressures stable.  Continue to monitor. 11. Chronic diastolic heart failure.  Appears compensated.  Continue to monitor. 12. Depression.  Continue on trazodone and bupropion. 13. COPD with chronic respiratory failure.  Continue on supplemental oxygen and bronchodilators as needed.  No  wheezing at this time.  Follow clinically.   DVT prophylaxis: SCDs Code Status: Full code Family Communication: Discussed with patient, no family present Disposition Plan: Discharge back to skilled nursing facility on 1/24   Consultants:   Gastroenterology  Procedures:  EGD: - Moderate Schatzki ring. Dilated. - MILD Gastritis. Biopsied.  - Non-bleeding duodenal diverticulum  Antimicrobials:   Ceftriaxone 1/17 >1/24  Flagyl 1/17 >1/27  Discharge Diagnoses:  Principal Problem:   GI bleed Active Problems:   Diabetes type 2, uncontrolled (HCC)   HTN (hypertension)   Depression   Unspecified atrial fibrillation (HCC)   Chronic diastolic CHF (congestive heart failure) (HCC)   CKD (chronic kidney disease), stage III (HCC)   Hypokalemia   COPD (chronic obstructive pulmonary disease) (HCC)   Coagulopathy (HCC)   Colitis   Lower abdominal pain   Rectal bleeding   Gastritis due to nonsteroidal anti-inflammatory drug   Esophageal dysphagia   Hypotension due to hypovolemia  Discharge Instructions: Discharge Instructions    Increase activity slowly   Complete by:  As directed      Allergies as of 09/10/2018  Reactions   Latex Itching, Rash      Medication List    STOP taking these medications   benzonatate 100 MG capsule Commonly known as:  TESSALON PERLES   HYDROcodone-homatropine 5-1.5 MG/5ML syrup Commonly known as:  HYCODAN     TAKE these medications   acetaminophen 500 MG tablet Commonly known as:  TYLENOL Take 1,000 mg by mouth every 8 (eight) hours as needed for mild pain or headache.   albuterol 1.25 MG/3ML nebulizer solution Commonly known as:  ACCUNEB Take 1 ampule by nebulization every 6 (six) hours as needed for wheezing.   albuterol 108 (90 Base) MCG/ACT inhaler Commonly known as:  PROVENTIL HFA;VENTOLIN HFA Inhale 2 puffs into the lungs every 6 (six) hours as needed for wheezing or  shortness of breath.   apixaban 5 MG Tabs tablet Commonly known as:  ELIQUIS Take 1 tablet (5 mg total) by mouth 2 (two) times daily.   buPROPion 300 MG 24 hr tablet Commonly known as:  WELLBUTRIN XL Take 300 mg by mouth daily.   ciprofloxacin 500 MG tablet Commonly known as:  CIPRO Take 1 tablet (500 mg total) by mouth 2 (two) times daily for 3 days.   dicyclomine 10 MG capsule Commonly known as:  BENTYL Take 2 capsules (20 mg total) by mouth 3 (three) times daily as needed for spasms (diarrhea).   diltiazem 120 MG 24 hr capsule Commonly known as:  CARDIZEM CD Take 1 capsule (120 mg total) by mouth daily. What changed:    medication strength  how much to take   escitalopram 5 MG tablet Commonly known as:  LEXAPRO Take 1 tablet (5 mg total) by mouth daily. What changed:  how much to take   fluticasone 50 MCG/ACT nasal spray Commonly known as:  FLONASE Place 2 sprays into both nostrils daily.   furosemide 40 MG tablet Commonly known as:  LASIX Take 1 tablet (40 mg total) by mouth daily. What changed:  how much to take   glipiZIDE 10 MG tablet Commonly known as:  GLUCOTROL Take 10 mg by mouth 2 (two) times daily.   guaifenesin 400 MG Tabs tablet Commonly known as:  MUCUS RELIEF Take 1 tablet (400 mg total) by mouth every 6 (six) hours as needed (mucus and chest congestion). What changed:    how much to take  when to take this  reasons to take this   LEVEMIR FLEXTOUCH 100 UNIT/ML Pen Generic drug:  Insulin Detemir Inject 10 Units into the skin daily.   loratadine 10 MG tablet Commonly known as:  CLARITIN Take 10 mg by mouth daily.   metoprolol tartrate 25 MG tablet Commonly known as:  LOPRESSOR Take 1 tablet (25 mg total) by mouth 2 (two) times daily. What changed:    medication strength  how much to take  Another medication with the same name was removed. Continue taking this medication, and follow the directions you see here.   metroNIDAZOLE  500 MG tablet Commonly known as:  FLAGYL Take 1 tablet (500 mg total) by mouth 3 (three) times daily for 3 days.   nystatin powder Commonly known as:  MYCOSTATIN/NYSTOP Apply topically 3 (three) times daily for 7 days.   pantoprazole 40 MG tablet Commonly known as:  PROTONIX Take 1 tablet (40 mg total) by mouth daily before breakfast. Start taking on:  September 11, 2018   pioglitazone 45 MG tablet Commonly known as:  ACTOS Take 45 mg by mouth daily.   polyethylene glycol packet  Commonly known as:  MIRALAX / GLYCOLAX Take 17 g by mouth daily as needed.   potassium chloride SA 20 MEQ tablet Commonly known as:  K-DUR,KLOR-CON Take 1 tablet (20 mEq total) by mouth daily.   SPIRIVA RESPIMAT 1.25 MCG/ACT Aers Generic drug:  Tiotropium Bromide Monohydrate Inhale 2 sprays into the lungs daily.   traZODone 50 MG tablet Commonly known as:  DESYREL Take 0.5 tablets (25 mg total) by mouth at bedtime.      Follow-up Information    Katrina Cha, MD. Schedule an appointment as soon as possible for a visit in 2 week(s).   Specialty:  Internal Medicine Why:  Hospital Follow Up  Contact information: [address] [place] [address] [phone]        Danie Binder, MD. Schedule an appointment as soon as possible for a visit in 1 month(s).   Specialty:  Gastroenterology Why:  Arrange for colonoscopy Contact information: [place] [address] [phone]          Allergies  Allergen Reactions  . Latex Itching and Rash   Allergies as of 09/10/2018      Reactions   Latex Itching, Rash      Medication List    STOP taking these medications   benzonatate 100 MG capsule Commonly known as:  TESSALON PERLES   HYDROcodone-homatropine 5-1.5 MG/5ML syrup Commonly known as:  HYCODAN     TAKE these medications   acetaminophen 500 MG tablet Commonly known as:  TYLENOL Take 1,000 mg by mouth every 8 (eight) hours as needed for  mild pain or headache.   albuterol 1.25 MG/3ML nebulizer solution Commonly known as:  ACCUNEB Take 1 ampule by nebulization every 6 (six) hours as needed for wheezing.   albuterol 108 (90 Base) MCG/ACT inhaler Commonly known as:  PROVENTIL HFA;VENTOLIN HFA Inhale 2 puffs into the lungs every 6 (six) hours as needed for wheezing or shortness of breath.   apixaban 5 MG Tabs tablet Commonly known as:  ELIQUIS Take 1 tablet (5 mg total) by mouth 2 (two) times daily.   buPROPion 300 MG 24 hr tablet Commonly known as:  WELLBUTRIN XL Take 300 mg by mouth daily.   ciprofloxacin 500 MG tablet Commonly known as:  CIPRO Take 1 tablet (500 mg total) by mouth 2 (two) times daily for 3 days.   dicyclomine 10 MG capsule Commonly known as:  BENTYL Take 2 capsules (20 mg total) by mouth 3 (three) times daily as needed for spasms (diarrhea).   diltiazem 120 MG 24 hr capsule Commonly known as:  CARDIZEM CD Take 1 capsule (120 mg total) by mouth daily. What changed:    medication strength  how much to take   escitalopram 5 MG tablet Commonly known as:  LEXAPRO Take 1 tablet (5 mg total) by mouth daily. What changed:  how much to take   fluticasone 50 MCG/ACT nasal spray Commonly known as:  FLONASE Place 2 sprays into both nostrils daily.   furosemide 40 MG tablet Commonly known as:  LASIX Take 1 tablet (40 mg total) by mouth daily. What changed:  how much to take   glipiZIDE 10 MG tablet Commonly known as:  GLUCOTROL Take 10 mg by mouth 2 (two) times daily.   guaifenesin 400 MG Tabs tablet Commonly known as:  MUCUS RELIEF Take 1 tablet (400 mg total) by mouth every 6 (six) hours as needed (mucus and chest congestion). What changed:    how  much to take  when to take this  reasons to take this   LEVEMIR FLEXTOUCH 100 UNIT/ML Pen Generic drug:  Insulin Detemir Inject 10 Units into the skin daily.   loratadine 10 MG tablet Commonly known as:  CLARITIN Take 10 mg by mouth  daily.   metoprolol tartrate 25 MG tablet Commonly known as:  LOPRESSOR Take 1 tablet (25 mg total) by mouth 2 (two) times daily. What changed:    medication strength  how much to take  Another medication with the same name was removed. Continue taking this medication, and follow the directions you see here.   metroNIDAZOLE 500 MG tablet Commonly known as:  FLAGYL Take 1 tablet (500 mg total) by mouth 3 (three) times daily for 3 days.   nystatin powder Commonly known as:  MYCOSTATIN/NYSTOP Apply topically 3 (three) times daily for 7 days.   pantoprazole 40 MG tablet Commonly known as:  PROTONIX Take 1 tablet (40 mg total) by mouth daily before breakfast. Start taking on:  September 11, 2018   pioglitazone 45 MG tablet Commonly known as:  ACTOS Take 45 mg by mouth daily.   polyethylene glycol packet Commonly known as:  MIRALAX / GLYCOLAX Take 17 g by mouth daily as needed.   potassium chloride SA 20 MEQ tablet Commonly known as:  K-DUR,KLOR-CON Take 1 tablet (20 mEq total) by mouth daily.   SPIRIVA RESPIMAT 1.25 MCG/ACT Aers Generic drug:  Tiotropium Bromide Monohydrate Inhale 2 sprays into the lungs daily.   traZODone 50 MG tablet Commonly known as:  DESYREL Take 0.5 tablets (25 mg total) by mouth at bedtime.       Procedures/Studies: Dg Chest 2 View  Result Date: 08/16/2018 CLINICAL DATA:  Acute cough and shortness of breath. EXAM: CHEST - 2 VIEW COMPARISON:  08/10/2018 and prior radiographs FINDINGS: Cardiomegaly again noted. RIGHT pleural effusion and RIGHT basilar atelectasis again noted with slightly improved RIGHT basilar aeration. There is no evidence of pneumothorax. No acute bony abnormalities identified. IMPRESSION: Slightly improved RIGHT basilar aeration since 08/10/2018 with continued RIGHT pleural effusion and RIGHT basilar atelectasis. Cardiomegaly Electronically Signed   By: Margarette Canada M.D.   On: 08/16/2018 18:51   Ct Abdomen Pelvis W  Contrast  Result Date: 09/03/2018 CLINICAL DATA:  68 year old female with left-sided abdominal pain and rectal bleeding. EXAM: CT ABDOMEN AND PELVIS WITH CONTRAST TECHNIQUE: Multidetector CT imaging of the abdomen and pelvis was performed using the standard protocol following bolus administration of intravenous contrast. CONTRAST:  137m ISOVUE-300 IOPAMIDOL (ISOVUE-300) INJECTION 61% COMPARISON:  Renal ultrasound dated 04/05/2018. FINDINGS: Lower chest: There is a small right pleural effusion with associated right lung base partial atelectasis. Pneumonia is not excluded. Clinical correlation is recommended. There is mild cardiomegaly. There is calcification of the mitral annulus. No intra-abdominal free air or free fluid. Hepatobiliary: The liver is unremarkable. Cholecystectomy. There is dilatation of the common bile duct measuring up to 19 mm. No calcified gallstone. Pancreas: The pancreas is poorly visualized and appears atrophic with fatty infiltration. 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 Further evaluation with MRI on a nonemergent basis recommended. Spleen: Normal in size without focal abnormality. Adrenals/Urinary Tract: The adrenal glands are unremarkable. Mild renal parenchyma atrophy and cortical irregularity and thinning. Subcentimeter left renal hypodense lesions are too small to characterize. There is no hydronephrosis  on either side. There is symmetric enhancement and excretion of contrast by both kidneys. The visualized ureters appear unremarkable. Minimal thickened appearance of the bladder wall, likely related to underdistention. Stomach/Bowel: Loose stool throughout the colon compatible with diarrheal state. There is diffuse enhancement of the colonic mucosa most consistent with colitis. There is sigmoid diverticulosis without active inflammatory  changes. There is no bowel obstruction. The appendix is not identified with certainty and likely surgically absent. Vascular/Lymphatic: There is moderate aortoiliac atherosclerotic disease. No portal venous gas. There is no adenopathy. Reproductive: Hysterectomy. There is a 4.5 x 4.2 cm multicystic lesion in the right ovary. Further evaluation with ultrasound on a nonemergent basis recommended. Depending on the ultrasound finding additional evaluation with MRI the required. A 1.3 cm rounded nodular density in the left anterior pelvis (series 3, image 67) likely represents a lymph node. Other: Mild diffuse subcutaneous edema. No fluid collection. Musculoskeletal: Degenerative changes of the spine and osteopenia. Partially visualized left femoral fixation rod. No acute osseous pathology. Grade 1 L4-L5 anterolisthesis. IMPRESSION: 1. Diarrheal state with findings of colitis. Correlation with clinical exam and stool cultures recommended. No bowel obstruction. 2. Sigmoid diverticulosis without active inflammatory changes. 3. Cystic lesion in the region of the body of the pancreas. Further evaluation with MRI on a nonemergent basis recommended. 4. Right ovarian multicystic lesion. Further evaluation with pelvic ultrasound recommended. 5. Small right pleural effusion with associated right lung base partial atelectasis. Pneumonia is not excluded. Clinical correlation is recommended. Electronically Signed   By: Anner Crete M.D.   On: 09/03/2018 02:15   Mr 3d Recon At Scanner  Result Date: 09/09/2018 CLINICAL DATA:  Pancreatic body lesion for further characterization. EXAM: MRI ABDOMEN WITHOUT AND WITH CONTRAST (INCLUDING MRCP) TECHNIQUE: Multiplanar multisequence MR imaging of the abdomen was performed both before and after the administration of intravenous contrast. Heavily T2-weighted images of the biliary and pancreatic ducts were obtained, and three-dimensional MRCP images were rendered by post processing.  CONTRAST:  10 cc Gadavist COMPARISON:  CT scan 09/03/2018 FINDINGS: Despite efforts by the technologist and patient, motion artifact is present on today's exam and could not be eliminated. This reduces exam sensitivity and specificity. Lower chest: Moderate right and small left pleural effusions with cardiomegaly. Passive atelectasis of the right lower lobe. Hepatobiliary: Mild extrahepatic biliary dilatation, CBD 1.1 cm in diameter, likely a physiologic response to prior cholecystectomy. No significant abnormal hepatic enhancement. Pancreas: Severe pancreatic atrophy. Multiple dilated side ducts along the pancreatic tail and body, most at or below 5 mm in diameter. The more confluent lesion shown on recent CT has resolved and probably represented a lymph node adjacent to the pancreas or a small fluid collection. No worrisome pancreatic findings are currently identified. Spleen:  Unremarkable Adrenals/Urinary Tract: 2 small hypodense lesions in the left kidney are likely cysts although technically too small to characterize due to the degree of motion artifact. Adrenal glands normal. Stomach/Bowel: Unremarkable Vascular/Lymphatic: Aortoiliac atherosclerotic vascular disease. No pathologic adenopathy is identified. Other:  Diffuse subcutaneous edema especially along the flanks. Musculoskeletal: Unremarkable IMPRESSION: 1. The lesion of concern on prior CT has resolved and was probably a lymph node or small transient fluid collection. Although there are some tiny side duct dilatations along the pancreas, these are highly likely to be the result of prior pancreatitis and did not require further workup. Underlying pancreatic atrophy noted. 2. Moderate right and small left pleural effusion. 3. Cardiomegaly. 4. Extrahepatic biliary prominence is likely a physiologic response to cholecystectomy. 5.  Aortic Atherosclerosis (ICD10-I70.0). 6. Subcutaneous edema along the flanks. 7. Despite efforts by the technologist and  patient, motion artifact is present on today's exam and could not be eliminated. This reduces exam sensitivity and specificity. Electronically Signed   By: Van Clines M.D.   On: 09/09/2018 15:42   Mr Abdomen Mrcp Moise Boring Contast  Result Date: 09/09/2018 CLINICAL DATA:  Pancreatic body lesion for further characterization. EXAM: MRI ABDOMEN WITHOUT AND WITH CONTRAST (INCLUDING MRCP) TECHNIQUE: Multiplanar multisequence MR imaging of the abdomen was performed both before and after the administration of intravenous contrast. Heavily T2-weighted images of the biliary and pancreatic ducts were obtained, and three-dimensional MRCP images were rendered by post processing. CONTRAST:  10 cc Gadavist COMPARISON:  CT scan 09/03/2018 FINDINGS: Despite efforts by the technologist and patient, motion artifact is present on today's exam and could not be eliminated. This reduces exam sensitivity and specificity. Lower chest: Moderate right and small left pleural effusions with cardiomegaly. Passive atelectasis of the right lower lobe. Hepatobiliary: Mild extrahepatic biliary dilatation, CBD 1.1 cm in diameter, likely a physiologic response to prior cholecystectomy. No significant abnormal hepatic enhancement. Pancreas: Severe pancreatic atrophy. Multiple dilated side ducts along the pancreatic tail and body, most at or below 5 mm in diameter. The more confluent lesion shown on recent CT has resolved and probably represented a lymph node adjacent to the pancreas or a small fluid collection. No worrisome pancreatic findings are currently identified. Spleen:  Unremarkable Adrenals/Urinary Tract: 2 small hypodense lesions in the left kidney are likely cysts although technically too small to characterize due to the degree of motion artifact. Adrenal glands normal. Stomach/Bowel: Unremarkable Vascular/Lymphatic: Aortoiliac atherosclerotic vascular disease. No pathologic adenopathy is identified. Other:  Diffuse subcutaneous edema  especially along the flanks. Musculoskeletal: Unremarkable IMPRESSION: 1. The lesion of concern on prior CT has resolved and was probably a lymph node or small transient fluid collection. Although there are some tiny side duct dilatations along the pancreas, these are highly likely to be the result of prior pancreatitis and did not require further workup. Underlying pancreatic atrophy noted. 2. Moderate right and small left pleural effusion. 3. Cardiomegaly. 4. Extrahepatic biliary prominence is likely a physiologic response to cholecystectomy. 5.  Aortic Atherosclerosis (ICD10-I70.0). 6. Subcutaneous edema along the flanks. 7. Despite efforts by the technologist and patient, motion artifact is present on today's exam and could not be eliminated. This reduces exam sensitivity and specificity. Electronically Signed   By: Van Clines M.D.   On: 09/09/2018 15:42   US Thyroid  Result Date: 09/09/2018 CLINICAL DATA:  Incidental on CT. Thyroid nodule incidentally noted on chest CT from 08/10/2018. Family history of thyroid cancer. EXAM: THYROID ULTRASOUND TECHNIQUE: Ultrasound examination of the thyroid gland and adjacent soft tissues was performed. COMPARISON:  Chest CT-08/10/2018 FINDINGS: Parenchymal Echotexture: Mildly heterogenous Isthmus: Normal in size measures 0.4 cm in diameter Right lobe: Normal in size measuring 3.9 x 2.1 x 2.3 cm Left lobe: Normal in size measuring 4.1 x 1.8 x 1.9 cm _________________________________________________________ Estimated total number of nodules >/= 1 cm: 2 Number of spongiform nodules >/=  2 cm not described below (TR1): 0 Number of mixed cystic and solid nodules >/= 1.5 cm not described below (TR2): 0 _________________________________________________________ Nodule # 1: Location: Right; Mid - this nodule correlates with the nodule questioned on preceding chest CT Maximum size: 2.3 cm; Other 2 dimensions: 1.9 x 1.7 cm Composition: mixed cystic and solid (1)  Echogenicity: isoechoic (1) Shape: not taller-than-wide (0) Margins:  smooth (0) Echogenic foci: none (0) ACR TI-RADS total points: 2. ACR TI-RADS risk category: TR2 (2 points). ACR TI-RADS recommendations: This nodule does NOT meet TI-RADS criteria for biopsy or dedicated follow-up. _________________________________________________________ There is an approximately 1.1 x 1.0 x 1.0 cm partially solid though predominantly cystic nodule within the inferior pole the left lobe of the thyroid which does not meet imaging criteria to recommend percutaneous sampling or continued dedicated follow-up. IMPRESSION: 1. Findings suggestive of multinodular goiter. 2. The approximately 2.3 cm mixed cystic and solid nodule within the right lobe of the thyroid, correlating with the nodule seen on preceding chest CT, does not meet imaging criteria to recommend percutaneous sampling or continued dedicated follow-up. The above is in keeping with the ACR TI-RADS recommendations - J Am Coll Radiol 2017;14:587-595. Electronically Signed   By: Sandi Mariscal M.D.   On: 09/09/2018 16:04   US Pelvic Complete With Transvaginal  Result Date: 09/09/2018 CLINICAL DATA:  Right ovarian mass seen on CT scan from September 03, 2018. Hysterectomy 37 years ago. EXAM: TRANSABDOMINAL AND TRANSVAGINAL ULTRASOUND OF PELVIS TECHNIQUE: Both transabdominal and transvaginal ultrasound examinations of the pelvis were performed. Transabdominal technique was performed for global imaging of the pelvis including uterus, ovaries, adnexal regions, and pelvic cul-de-sac. It was necessary to proceed with endovaginal exam following the transabdominal exam to visualize the adnexa. COMPARISON:  None FINDINGS: Uterus Surgically absent Right ovary The right adnexal mass seen on the recent CT scan is not visualized on this study. Left ovary Not visualized. Other findings No abnormal free fluid. IMPRESSION: 1. The right adnexal mass seen on recent CT imaging could not be  visualized on this study. Given the lack of visualization with ultrasound, a pelvic MR could further evaluate if clinically warranted. 2. The left ovary was not visualized either. Electronically Signed   By: Dorise Bullion III M.D   On: 09/09/2018 15:54   Dg Esophagus Inc Scout Chest & Delayed Img Single Cm (ba Or Sol)  Result Date: 08/23/2018 CLINICAL DATA:  68 year old female with history of dysphagia. EXAM: ESOPHOGRAM/BARIUM SWALLOW TECHNIQUE: Single contrast examination was performed using  thin barium. FLUOROSCOPY TIME:  Fluoroscopy Time:  1 minutes Radiation Exposure Index (if provided by the fluoroscopic device): 6.6 mGy COMPARISON:  None. FINDINGS: Examination was severely limited by lack of patient mobility. Patient was imaged in a semi upright supine position with thin barium. Repeated swallows demonstrated intermittent failure to fully propagate a normal primary peristaltic wave, with numerous tertiary contractions. No definite esophageal mass, stricture or esophageal ring. No definite hiatal hernia. A barium tablet was administered, but the patient was unable to swallow the tablet. IMPRESSION: 1. Limited examination, as above. Today's study demonstrates nonspecific esophageal motility disorder with intermittent tertiary contractions. Electronically Signed   By: Vinnie Langton M.D.   On: 08/23/2018 15:48      Subjective: Pt without complaints.  No blood in stool. No CP, no SOB.  Pt excited to return to rehab.   Discharge Exam: Vitals:   09/10/18 0709 09/10/18 0902  BP: (!) 117/56   Pulse: (!) 59   Resp: 20   Temp: 98.4 F (36.9 C)   SpO2: 97% 98%   Vitals:   09/09/18 0730 09/09/18 2303 09/10/18 0709 09/10/18 0902  BP:  121/65 (!) 117/56   Pulse:  92 (!) 59   Resp:  18 20   Temp:  98.3 F (36.8 C) 98.4 F (36.9 C)   TempSrc:  Oral Oral   SpO2: 92% 100%  97% 98%  Weight:      Height:       General exam: Alert, awake, oriented x 3 Respiratory system: Clear to auscultation.  Respiratory effort normal. Cardiovascular system: irregularly irregular. No murmurs, rubs, gallops. Gastrointestinal system: Abdomen is nondistended, soft and nontender. No organomegaly or masses felt. Normal bowel sounds heard. Central nervous system: Alert and oriented. No focal neurological deficits. Extremities: No C/C/E, +pedal pulses.  Skin: No rashes, lesions or ulcers.  Psychiatry: Judgement and insight appear normal. Mood & affect appropriate.    The results of significant diagnostics from this hospitalization (including imaging, microbiology, ancillary and laboratory) are listed below for reference.    Microbiology: No results found for this or any previous visit (from the past 240 hour(s)).   Labs: BNP (last 3 results) Recent Labs    07/21/18 1109 08/10/18 0746 08/16/18 1848  BNP 751.0* 247.0* 182.9*   Basic Metabolic Panel: Recent Labs  Lab 09/03/18 1054 09/04/18 0433 09/05/18 0451 09/06/18 0621 09/07/18 0443  NA 138 137 135 136 137  K 3.6 3.6 3.5 3.7 4.1  CL 100 104 102 100 105  CO2 26 23 26 26 23   GLUCOSE 91 171* 175* 165* 169*  BUN 34* 30* 24* 21 17  CREATININE 1.08* 1.03* 0.96 0.92 0.83  CALCIUM 7.6* 7.6* 7.9* 8.2* 8.4*   Liver Function Tests: No results for input(s): AST, ALT, ALKPHOS, BILITOT, PROT, ALBUMIN in the last 168 hours. No results for input(s): LIPASE, AMYLASE in the last 168 hours. No results for input(s): AMMONIA in the last 168 hours. CBC: Recent Labs  Lab 09/06/18 0621 09/07/18 0443 09/08/18 0526 09/09/18 0628 09/10/18 0433  WBC 6.6 5.9 6.5 6.9 6.9  NEUTROABS 4.0 3.3 3.7 4.4 3.9  HGB 8.4* 8.7* 8.1* 7.9* 8.3*  HCT 29.4* 31.4* 28.4* 27.7* 28.9*  MCV 94.2 97.5 93.7 93.9 94.8  PLT 295 264 291 283 301   Cardiac Enzymes: No results for input(s): CKTOTAL, CKMB, CKMBINDEX, TROPONINI in the last 168 hours. BNP: Invalid input(s): POCBNP CBG: Recent Labs  Lab 09/08/18 0847 09/08/18 1305 09/08/18 1708 09/09/18 1123 09/09/18 1624   GLUCAP 165* 183* 150* 224* 131*   D-Dimer No results for input(s): DDIMER in the last 72 hours. Hgb A1c No results for input(s): HGBA1C in the last 72 hours. Lipid Profile No results for input(s): CHOL, HDL, LDLCALC, TRIG, CHOLHDL, LDLDIRECT in the last 72 hours. Thyroid function studies No results for input(s): TSH, T4TOTAL, T3FREE, THYROIDAB in the last 72 hours.  Invalid input(s): FREET3 Anemia work up No results for input(s): VITAMINB12, FOLATE, FERRITIN, TIBC, IRON, RETICCTPCT in the last 72 hours. Urinalysis    Component Value Date/Time   COLORURINE YELLOW 08/16/2018 2100   APPEARANCEUR CLEAR 08/16/2018 2100   LABSPEC 1.024 08/16/2018 2100   PHURINE 6.0 08/16/2018 2100   GLUCOSEU >=500 (A) 08/16/2018 2100   HGBUR MODERATE (A) 08/16/2018 2100   [place] NEGATIVE 08/16/2018 2100   KETONESUR 5 (A) 08/16/2018 2100   PROTEINUR NEGATIVE 08/16/2018 2100   UROBILINOGEN 0.2 12/01/2007 1025   NITRITE NEGATIVE 08/16/2018 2100   LEUKOCYTESUR TRACE (A) 08/16/2018 2100   Sepsis Labs Invalid input(s): PROCALCITONIN,  WBC,  LACTICIDVEN Microbiology No results found for this or any previous visit (from the past 240 hour(s)).  Time coordinating discharge: 38 mins  SIGNED:  Irwin Brakeman, MD  [facility] 09/10/2018, 10:52 AM

## 2018-09-10 NOTE — Plan of Care (Signed)
  Problem: Clinical Measurements: Goal: Ability to maintain clinical measurements within normal limits will improve Outcome: Adequate for Discharge Goal: Will remain free from infection Outcome: Adequate for Discharge Goal: Cardiovascular complication will be avoided Outcome: Adequate for Discharge   Problem: Activity: Goal: Risk for activity intolerance will decrease Outcome: Adequate for Discharge   Problem: Safety: Goal: Ability to remain free from injury will improve Outcome: Adequate for Discharge   Problem: Skin Integrity: Goal: Risk for impaired skin integrity will decrease Outcome: Adequate for Discharge   Problem: Clinical Measurements: Goal: Complications related to the disease process, condition or treatment will be avoided or minimized Outcome: Adequate for Discharge

## 2018-09-11 LAB — CBC WITH DIFFERENTIAL/PLATELET
Abs Immature Granulocytes: 0.12 10*3/uL — ABNORMAL HIGH (ref 0.00–0.07)
Basophils Absolute: 0 10*3/uL (ref 0.0–0.1)
Basophils Relative: 1 %
Eosinophils Absolute: 0.3 10*3/uL (ref 0.0–0.5)
Eosinophils Relative: 3 %
HCT: 27.8 % — ABNORMAL LOW (ref 36.0–46.0)
HEMOGLOBIN: 7.9 g/dL — AB (ref 12.0–15.0)
Immature Granulocytes: 2 %
LYMPHS ABS: 1.6 10*3/uL (ref 0.7–4.0)
Lymphocytes Relative: 21 %
MCH: 26.9 pg (ref 26.0–34.0)
MCHC: 28.4 g/dL — ABNORMAL LOW (ref 30.0–36.0)
MCV: 94.6 fL (ref 80.0–100.0)
MONOS PCT: 12 %
Monocytes Absolute: 0.9 10*3/uL (ref 0.1–1.0)
Neutro Abs: 4.8 10*3/uL (ref 1.7–7.7)
Neutrophils Relative %: 61 %
Platelets: 306 10*3/uL (ref 150–400)
RBC: 2.94 MIL/uL — ABNORMAL LOW (ref 3.87–5.11)
RDW: 15.9 % — ABNORMAL HIGH (ref 11.5–15.5)
WBC: 7.8 10*3/uL (ref 4.0–10.5)
nRBC: 0 % (ref 0.0–0.2)

## 2018-09-11 LAB — GLUCOSE, CAPILLARY
Glucose-Capillary: 168 mg/dL — ABNORMAL HIGH (ref 70–99)
Glucose-Capillary: 169 mg/dL — ABNORMAL HIGH (ref 70–99)
Glucose-Capillary: 240 mg/dL — ABNORMAL HIGH (ref 70–99)
Glucose-Capillary: 248 mg/dL — ABNORMAL HIGH (ref 70–99)

## 2018-09-11 NOTE — Progress Notes (Signed)
09/11/2018 11:51 AM  Pt still awaiting for insurance authorization to discharge to SNF. Pt was seen and examined today.  She had some nausea yesterday but otherwise stable, no bleeding.  Vitals stable.  Continue current management.  Please see dictated discharge summary from 09/10/18.    Vitals:   09/11/18 0500 09/11/18 1003  BP: 120/62   Pulse: 66   Resp: 20   Temp: 97.6 F (36.4 C)   SpO2: 97% 96%   Murvin Natal MD

## 2018-09-11 NOTE — Progress Notes (Signed)
LCSW called and spoke with Gerald Stabs at [facility] to inquire on insurance authorization. Per Gerald Stabs, they do not yet have the auth. Pt cannot dc until the auth is obtained.  [employer] LCSW [phone]

## 2018-09-12 LAB — CBC WITH DIFFERENTIAL/PLATELET
Abs Immature Granulocytes: 0.08 10*3/uL — ABNORMAL HIGH (ref 0.00–0.07)
Basophils Absolute: 0 10*3/uL (ref 0.0–0.1)
Basophils Relative: 0 %
EOS ABS: 0.3 10*3/uL (ref 0.0–0.5)
Eosinophils Relative: 3 %
HCT: 33.3 % — ABNORMAL LOW (ref 36.0–46.0)
Hemoglobin: 9.5 g/dL — ABNORMAL LOW (ref 12.0–15.0)
Immature Granulocytes: 1 %
Lymphocytes Relative: 17 %
Lymphs Abs: 1.5 10*3/uL (ref 0.7–4.0)
MCH: 26.9 pg (ref 26.0–34.0)
MCHC: 28.5 g/dL — ABNORMAL LOW (ref 30.0–36.0)
MCV: 94.3 fL (ref 80.0–100.0)
MONO ABS: 0.9 10*3/uL (ref 0.1–1.0)
Monocytes Relative: 10 %
Neutro Abs: 6.3 10*3/uL (ref 1.7–7.7)
Neutrophils Relative %: 69 %
Platelets: 332 10*3/uL (ref 150–400)
RBC: 3.53 MIL/uL — ABNORMAL LOW (ref 3.87–5.11)
RDW: 16.1 % — AB (ref 11.5–15.5)
WBC: 9.1 10*3/uL (ref 4.0–10.5)
nRBC: 0 % (ref 0.0–0.2)

## 2018-09-12 LAB — GLUCOSE, CAPILLARY
Glucose-Capillary: 231 mg/dL — ABNORMAL HIGH (ref 70–99)
Glucose-Capillary: 180 mg/dL — ABNORMAL HIGH (ref 70–99)
Glucose-Capillary: 181 mg/dL — ABNORMAL HIGH (ref 70–99)
Glucose-Capillary: 259 mg/dL — ABNORMAL HIGH (ref 70–99)

## 2018-09-12 MED ORDER — SIMETHICONE 80 MG PO CHEW
160.0000 mg | CHEWABLE_TABLET | Freq: Four times a day (QID) | ORAL | Status: DC | PRN
  Administered 2018-09-12: 160 mg via ORAL
  Filled 2018-09-12: qty 2

## 2018-09-12 MED ORDER — FLUCONAZOLE 150 MG PO TABS
150.0000 mg | ORAL_TABLET | Freq: Once | ORAL | Status: AC
  Administered 2018-09-12: 150 mg via ORAL
  Filled 2018-09-12: qty 1

## 2018-09-12 MED ORDER — INSULIN ASPART 100 UNIT/ML ~~LOC~~ SOLN
5.0000 [IU] | Freq: Once | SUBCUTANEOUS | Status: AC
  Administered 2018-09-12: 5 [IU] via SUBCUTANEOUS

## 2018-09-12 MED ORDER — FLUTICASONE PROPIONATE 50 MCG/ACT NA SUSP
2.0000 | Freq: Every day | NASAL | Status: DC
  Administered 2018-09-12 – 2018-09-13 (×2): 2 via NASAL
  Filled 2018-09-12: qty 16

## 2018-09-12 MED ORDER — TRAZODONE HCL 50 MG PO TABS
50.0000 mg | ORAL_TABLET | Freq: Every day | ORAL | Status: DC
  Administered 2018-09-12: 50 mg via ORAL
  Filled 2018-09-12: qty 1

## 2018-09-12 NOTE — Progress Notes (Signed)
LCSW has not received any update from either facility or insurance. It will likely be issues on Monday. LCSW will continue to monitor today  Dorla Guizar LCSW [phone]

## 2018-09-12 NOTE — Progress Notes (Signed)
09/12/2018 8:06 PM  Patient continues to wait for insurance authorization for discharge to skilled nursing facility.  Patient was seen and examined earlier today.  She has not had any nausea or vomiting.  She has had some difficulty sleeping and wishes to increase her trazodone dosage.  Vitals were otherwise stable.  She appears to be resting comfortably in bed on my arrival.  No distress.  Plan is to continue current management.  Vitals:   09/12/18 1026 09/12/18 1408  BP:  (!) 118/58  Pulse:  82  Resp:  18  Temp:  98.1 F (36.7 C)  SpO2: 96% 99%   [employer]

## 2018-09-13 ENCOUNTER — Telehealth: Payer: Self-pay | Admitting: Gastroenterology

## 2018-09-13 DIAGNOSIS — N3000 Acute cystitis without hematuria: Secondary | ICD-10-CM | POA: Diagnosis not present

## 2018-09-13 DIAGNOSIS — K2971 Gastritis, unspecified, with bleeding: Secondary | ICD-10-CM | POA: Diagnosis not present

## 2018-09-13 DIAGNOSIS — E119 Type 2 diabetes mellitus without complications: Secondary | ICD-10-CM | POA: Diagnosis not present

## 2018-09-13 DIAGNOSIS — E041 Nontoxic single thyroid nodule: Secondary | ICD-10-CM | POA: Diagnosis not present

## 2018-09-13 DIAGNOSIS — F5105 Insomnia due to other mental disorder: Secondary | ICD-10-CM | POA: Diagnosis not present

## 2018-09-13 DIAGNOSIS — Z86718 Personal history of other venous thrombosis and embolism: Secondary | ICD-10-CM | POA: Diagnosis not present

## 2018-09-13 DIAGNOSIS — E669 Obesity, unspecified: Secondary | ICD-10-CM | POA: Diagnosis not present

## 2018-09-13 DIAGNOSIS — I10 Essential (primary) hypertension: Secondary | ICD-10-CM | POA: Diagnosis not present

## 2018-09-13 DIAGNOSIS — M810 Age-related osteoporosis without current pathological fracture: Secondary | ICD-10-CM | POA: Diagnosis not present

## 2018-09-13 DIAGNOSIS — J441 Chronic obstructive pulmonary disease with (acute) exacerbation: Secondary | ICD-10-CM | POA: Diagnosis not present

## 2018-09-13 DIAGNOSIS — K51911 Ulcerative colitis, unspecified with rectal bleeding: Secondary | ICD-10-CM | POA: Diagnosis not present

## 2018-09-13 DIAGNOSIS — D649 Anemia, unspecified: Secondary | ICD-10-CM | POA: Diagnosis not present

## 2018-09-13 DIAGNOSIS — R319 Hematuria, unspecified: Secondary | ICD-10-CM | POA: Diagnosis not present

## 2018-09-13 DIAGNOSIS — D509 Iron deficiency anemia, unspecified: Secondary | ICD-10-CM | POA: Diagnosis not present

## 2018-09-13 DIAGNOSIS — D500 Iron deficiency anemia secondary to blood loss (chronic): Secondary | ICD-10-CM | POA: Diagnosis not present

## 2018-09-13 DIAGNOSIS — E1165 Type 2 diabetes mellitus with hyperglycemia: Secondary | ICD-10-CM | POA: Diagnosis not present

## 2018-09-13 DIAGNOSIS — Z7401 Bed confinement status: Secondary | ICD-10-CM | POA: Diagnosis not present

## 2018-09-13 DIAGNOSIS — N183 Chronic kidney disease, stage 3 (moderate): Secondary | ICD-10-CM | POA: Diagnosis not present

## 2018-09-13 DIAGNOSIS — K922 Gastrointestinal hemorrhage, unspecified: Secondary | ICD-10-CM | POA: Diagnosis not present

## 2018-09-13 DIAGNOSIS — K862 Cyst of pancreas: Secondary | ICD-10-CM | POA: Diagnosis not present

## 2018-09-13 DIAGNOSIS — N390 Urinary tract infection, site not specified: Secondary | ICD-10-CM | POA: Diagnosis not present

## 2018-09-13 DIAGNOSIS — R5381 Other malaise: Secondary | ICD-10-CM | POA: Diagnosis not present

## 2018-09-13 DIAGNOSIS — F339 Major depressive disorder, recurrent, unspecified: Secondary | ICD-10-CM | POA: Diagnosis not present

## 2018-09-13 DIAGNOSIS — F331 Major depressive disorder, recurrent, moderate: Secondary | ICD-10-CM | POA: Diagnosis not present

## 2018-09-13 DIAGNOSIS — K5900 Constipation, unspecified: Secondary | ICD-10-CM | POA: Diagnosis not present

## 2018-09-13 DIAGNOSIS — I5032 Chronic diastolic (congestive) heart failure: Secondary | ICD-10-CM | POA: Diagnosis not present

## 2018-09-13 DIAGNOSIS — D519 Vitamin B12 deficiency anemia, unspecified: Secondary | ICD-10-CM | POA: Diagnosis not present

## 2018-09-13 DIAGNOSIS — R531 Weakness: Secondary | ICD-10-CM | POA: Diagnosis not present

## 2018-09-13 DIAGNOSIS — K224 Dyskinesia of esophagus: Secondary | ICD-10-CM | POA: Diagnosis not present

## 2018-09-13 DIAGNOSIS — K529 Noninfective gastroenteritis and colitis, unspecified: Secondary | ICD-10-CM | POA: Diagnosis not present

## 2018-09-13 DIAGNOSIS — I4891 Unspecified atrial fibrillation: Secondary | ICD-10-CM | POA: Diagnosis not present

## 2018-09-13 LAB — CBC WITH DIFFERENTIAL/PLATELET
Abs Immature Granulocytes: 0.05 10*3/uL (ref 0.00–0.07)
Basophils Absolute: 0 10*3/uL (ref 0.0–0.1)
Basophils Relative: 1 %
Eosinophils Absolute: 0.2 10*3/uL (ref 0.0–0.5)
Eosinophils Relative: 2 %
HCT: 27.9 % — ABNORMAL LOW (ref 36.0–46.0)
Hemoglobin: 8 g/dL — ABNORMAL LOW (ref 12.0–15.0)
IMMATURE GRANULOCYTES: 1 %
Lymphocytes Relative: 21 %
Lymphs Abs: 1.7 10*3/uL (ref 0.7–4.0)
MCH: 27 pg (ref 26.0–34.0)
MCHC: 28.7 g/dL — ABNORMAL LOW (ref 30.0–36.0)
MCV: 94.3 fL (ref 80.0–100.0)
Monocytes Absolute: 0.8 10*3/uL (ref 0.1–1.0)
Monocytes Relative: 10 %
NEUTROS PCT: 65 %
Neutro Abs: 5.6 10*3/uL (ref 1.7–7.7)
Platelets: 325 10*3/uL (ref 150–400)
RBC: 2.96 MIL/uL — ABNORMAL LOW (ref 3.87–5.11)
RDW: 16.2 % — ABNORMAL HIGH (ref 11.5–15.5)
WBC: 8.4 10*3/uL (ref 4.0–10.5)
nRBC: 0 % (ref 0.0–0.2)

## 2018-09-13 LAB — GLUCOSE, CAPILLARY
Glucose-Capillary: 136 mg/dL — ABNORMAL HIGH (ref 70–99)
Glucose-Capillary: 181 mg/dL — ABNORMAL HIGH (ref 70–99)

## 2018-09-13 MED ORDER — TRAZODONE HCL 50 MG PO TABS: 50.0000 mg | ORAL_TABLET | Freq: Every day | ORAL | 0 refills | Status: DC

## 2018-09-13 MED ORDER — SIMETHICONE 80 MG PO CHEW: 80.0000 mg | CHEWABLE_TABLET | Freq: Four times a day (QID) | ORAL | 0 refills | Status: DC | PRN

## 2018-09-13 NOTE — Progress Notes (Signed)
Patient left floor in stable condition via stretcher with EMS transport. Discharged to [facility]. Donavan Foil, RN

## 2018-09-13 NOTE — Progress Notes (Signed)
Physical Therapy Treatment Patient Details Name: [name] MRN: [id number] DOB: May 05, 1951 Today's Date: 09/13/2018    History of Present Illness [name] is a 68 y.o. female with medical history significant of chronic atrial fibrillation, chronic diastolic CHF, depression, type 2 diabetes, history of left ventricle thrombus, hypertension, morbid obesity, osteoporosis, UTI who is coming from the [facility] where she has been admitted for rehabilitation after having fractures on both of her lower extremities in August and December.  She was recently admitted to the hospital first with acute on chronic diastolic CHF and 6 days later was admitted due to dehydration who is coming to the emergency department due to hematochezia.    PT Comments    Patient able to complete up to 5 sit to stands, but limited to standing for up to 1 minute due to BLE weakness and buckling of knees, and unable to take steps or transfer to chair.  Patient most limited due to c/o severe rash under arms in axillary regions with poor tolerance for hands on assistance in these areas during sit to stands and gait belt tends to slide up to axillary region when used.  Patient will benefit from continued physical therapy in hospital and recommended venue below to increase strength, balance, endurance for safe ADLs and gait.   Follow Up Recommendations  SNF     Equipment Recommendations       Recommendations for Other Services       Precautions / Restrictions Precautions Precautions: Fall Restrictions Weight Bearing Restrictions: No    Mobility  Bed Mobility Overal bed mobility: Needs Assistance Bed Mobility: Supine to Sit;Sit to Supine     Supine to sit: Min assist Sit to supine: Min assist   General bed mobility comments: increased time, use of bedrail  Transfers Overall transfer level: Needs assistance Equipment used: Rolling walker (2 wheeled) Transfers: Sit to/from Stand Sit to Stand: Mod  assist;Max assist         General transfer comment: limited to standing for up to 1 minute due to BLE giving way, unable to transfer to chair  Ambulation/Gait                 Stairs             Wheelchair Mobility    Modified Rankin (Stroke Patients Only)       Balance Overall balance assessment: Needs assistance Sitting-balance support: Feet supported;No upper extremity supported Sitting balance-Leahy Scale: Good     Standing balance support: Bilateral upper extremity supported;During functional activity Standing balance-Leahy Scale: Poor Standing balance comment: using RW                            Cognition Arousal/Alertness: Awake/alert Behavior During Therapy: WFL for tasks assessed/performed Overall Cognitive Status: Within Functional Limits for tasks assessed                                        Exercises General Exercises - Lower Extremity Long Arc Quad: Seated;Strengthening;AROM;Both;10 reps Hip Flexion/Marching: Seated;Strengthening;AROM;Both;10 reps Toe Raises: Seated;Strengthening;AROM;Both;10 reps Heel Raises: Seated;AROM;Strengthening;Both;10 reps    General Comments        Pertinent Vitals/Pain Pain Assessment: Faces Faces Pain Scale: Hurts whole lot Pain Location: pressure to bilateral axillary areas due to rash Pain Descriptors / Indicators: Sore;Discomfort;Grimacing;Guarding Pain Intervention(s): Limited activity within patient's  tolerance;Monitored during session    Home Living                      Prior Function            PT Goals (current goals can now be found in the care plan section) Acute Rehab PT Goals Patient Stated Goal: return home after rehab PT Goal Formulation: With patient Time For Goal Achievement: 09/22/18 Potential to Achieve Goals: Good Progress towards PT goals: Progressing toward goals    Frequency    Min 3X/week      PT Plan Current plan remains  appropriate    Co-evaluation              AM-PAC PT "6 Clicks" Mobility   Outcome Measure  Help needed turning from your back to your side while in a flat bed without using bedrails?: None Help needed moving from lying on your back to sitting on the side of a flat bed without using bedrails?: A Little Help needed moving to and from a bed to a chair (including a wheelchair)?: Total Help needed standing up from a chair using your arms (e.g., wheelchair or bedside chair)?: A Lot Help needed to walk in hospital room?: Total Help needed climbing 3-5 steps with a railing? : Total 6 Click Score: 12    End of Session Equipment Utilized During Treatment: Gait belt Activity Tolerance: Patient tolerated treatment well;Patient limited by fatigue Patient left: in bed;with call bell/phone within reach;with nursing/sitter in room Nurse Communication: Mobility status PT Visit Diagnosis: Unsteadiness on feet (R26.81);Other abnormalities of gait and mobility (R26.89);Muscle weakness (generalized) (M62.81)     Time: 1601-0932 PT Time Calculation (min) (ACUTE ONLY): 32 min  Charges:  $Therapeutic Exercise: 8-22 mins $Therapeutic Activity: 8-22 mins                     1:52 PM, 09/13/18 Lonell Grandchild, MPT Physical Therapist with [facility] [phone] office [phone] mobile phone

## 2018-09-13 NOTE — Telephone Encounter (Signed)
Please arrange outpatient appt in 3-4 weeks, may use urgent. Hospital follow-up. [facility] patient.

## 2018-09-13 NOTE — Discharge Summary (Signed)
Physician Discharge Summary  [name] KLK:[id number] DOB: 09-Aug-1951 DOA: 09/02/2018  PCP: [name], MD GI: [facility]  Admit date: 09/02/2018 Discharge date: 09/13/2018  Admitted From: SNF Disposition: SNF   Recommendations for Outpatient Follow-up:  1. Follow up with PCP in 2 weeks 2. Follow up with GI in 4 weeks for colonoscopy 3. Follow up with OB/GYN in 2-4 weeks to follow up ovarian cyst 4. Please check CBC in 7-10 days to follow up hemoglobin.   Discharge Condition: STABLE   CODE STATUS: FULL   Brief Hospitalization Summary: Please see all hospital notes, images, labs for full details of the hospitalization. HPI: [name] is a 68 y.o. female with medical history significant of chronic atrial fibrillation, chronic diastolic CHF, depression, type 2 diabetes, history of left ventricle thrombus, hypertension, morbid obesity, osteoporosis, UTI who is coming from the [facility] where she has been admitted for rehabilitation after having fractures on both of her lower extremities in August and December.  She was recently admitted to the hospital first with acute on chronic diastolic CHF and 6 days later was admitted due to dehydration who is coming to the emergency department due to hematochezia.  Per patient, she has been constipated for several days.  She has been nauseous and 2 days ago had a couple episodes of emesis.  Yesterday, Thursday, 09/02/2018, earlier in the day she was given two different laxatives and was unable to have a full bowel movement.  However, while she was in bed, she had incomplete bowel movements with a significant blood amount and maroon stools.  She denies hematemesis, but complains of abdominal pain and nausea.  No dysuria, frequency or hematuria.  She denies recent days fever, chills, but complains of sweats.  She denies dyspnea, sore throat, wheezing, hemoptysis, chest pain, palpitations, dizziness, diaphoresis, PND, orthopnea, but  occasionally gets pitting edema of the lower extremities.  She denies polyuria, polydipsia, polyphagia or blurred vision.  ED Course: Initial vital signs temperature 97.5 F, pulse 86, respirations 22, blood pressure 100/65 mmHg and O2 sat 89% on room air.  In the emergency department she received a 1000 mL NS bolus, ceftriaxone 2 g IVPB, metronidazole 500 mg IVPB and Andexxa.  Lab work reveals a fecal occult blood positive, white count of 11.2 with 83% neutrophils, 8% lymphocytes and 8% monocytes.  Her hemoglobin is 10.1 g/dL, which is slightly higher than her baseline which is normally 9+.  Platelets were 326.  PT was 21.9, INR 1.94 and APTT 39 seconds.  CMP shows a potassium of 3.4 and chloride 97 mmol S/L.  All other electrolytes are within normal limits 1 calcium of 7.8 mg/dL is corrected to an albumin of 2.7 g/dL.  BUN was 37, creatinine 1.07 and glucose 237 mg/dL.  Total protein was also low at 6.1 g/dL.  The rest of the LFTs are within normal limits.  Brief Narrative:  68 year old female admitted to the hospital with abdominal pain and bloody stools.  CT scan showed evidence of colitis.  She is chronically on Eliquis which was reversed in the emergency room.  She was started on intravenous antibiotics.  She is currently being observed and hemoglobin has been stable.  She was seen by gastroenterology with plans for possible endoscopic evaluation on 1/20.  Assessment & Plan:   Principal Problem:   GI bleed Active Problems:   Diabetes type 2, uncontrolled (HCC)   HTN (hypertension)   Depression   Unspecified atrial fibrillation (HCC)   Chronic diastolic  CHF (congestive heart failure) (HCC)   CKD (chronic kidney disease), stage III (HCC)   Hypokalemia   COPD (chronic obstructive pulmonary disease) (HCC)   Coagulopathy (HCC)   Colitis   Lower abdominal pain   Rectal bleeding   Gastritis due to nonsteroidal anti-inflammatory drug   Esophageal dysphagia   Hypotension due to  hypovolemia  1. Rectal bleeding. RESOLVED.  Felt to be related to colitis in the setting of anticoagulation.  Hemoglobin has been stable.  Seen by GI and underwent EGD with Schatzki ring and mild gastritis reported. Plans are for outpatient colonoscopy in 4 weeks.  Resumed apixaban 1/23 and no further bleeding seen.  Hg stable.  Follow up with GI in 4 weeks.   2. Chronic blood loss Anemia. hemoglobin has been stable around 8.  Recommend checking CBC in 7-10 days for recheck.   3. Colitis.  Complete 10 days of antibiotics per GI team.   No vomiting.  Tolerating diet. 4. Atrial fibrillation.  Resumed apixaban 1/23 for full anticoagulation. She has been restarted on beta blockers. Resumed diltiazem 120 mg. This may need to be titrated up based on heart rate as she recovers. Limitations may be blood pressure. Heart rate is currently stable. 5. 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  Per GI, restarted eliquis on 1/23. 6. Cystic lesion on thyroid - Patient had thyroid US to evaluate as patient reports that she may not be able to follow up on this outpatient.  Small thyroid nodule seen and radiology did not recommend follow up for this nodule.  7. Cystic lesion on right ovary - pelvic US did not show the cyst and was not seen.  I have recommended patient follow up with her Gynecologist to have it checked again in 2-4 weeks.  8. Cystic lesion on pancreas - radiology recommending MRI.  MRCP perfomed 1/23 and no worrisome pancreatic findings.  Pt to follow up with GI in 4 weeks.   9. Type 2 diabetes mellitus.  Initially had hypoglycemia on admission.  This has since improved.  Blood sugars now stable on sliding scale.  Resume glipizide and Levemir on discharge.  Will discontinue pioglitazone due to issues with volume overload. 10. Hypertension.  Blood pressures stable.  Continue to monitor. 11. Chronic diastolic heart failure.  Appears compensated.  Continue to monitor. 12. Depression.  Continue on  trazodone and bupropion. 13. COPD with chronic respiratory failure.  Continue on supplemental oxygen and bronchodilators as needed.  No wheezing at this time.  Follow clinically.   Consultants:   Gastroenterology  Procedures:  EGD: - Moderate Schatzki ring. Dilated. - MILD Gastritis. Biopsied.  - Non-bleeding duodenal diverticulum  Antimicrobials:   Ceftriaxone 1/17 >1/27  Flagyl 1/17 >1/27  Discharge Diagnoses:  Principal Problem:   GI bleed Active Problems:   Diabetes type 2, uncontrolled (HCC)   HTN (hypertension)   Depression   Unspecified atrial fibrillation (HCC)   Chronic diastolic CHF (congestive heart failure) (HCC)   CKD (chronic kidney disease), stage III (HCC)   Hypokalemia   COPD (chronic obstructive pulmonary disease) (HCC)   Coagulopathy (HCC)   Colitis   Lower abdominal pain   Rectal bleeding   Gastritis due to nonsteroidal anti-inflammatory drug   Esophageal dysphagia   Hypotension due to hypovolemia  Discharge Instructions: Discharge Instructions    Diet - low sodium heart healthy   Complete by:  As directed    Increase activity slowly   Complete by:  As directed  Increase activity slowly   Complete by:  As directed      Allergies as of 09/13/2018      Reactions   Latex Itching, Rash      Medication List    STOP taking these medications   benzonatate 100 MG capsule Commonly known as:  TESSALON PERLES   HYDROcodone-homatropine 5-1.5 MG/5ML syrup Commonly known as:  HYCODAN   pioglitazone 45 MG tablet Commonly known as:  ACTOS     TAKE these medications   acetaminophen 500 MG tablet Commonly known as:  TYLENOL Take 1,000 mg by mouth every 8 (eight) hours as needed for mild pain or headache.   albuterol 1.25 MG/3ML nebulizer solution Commonly known as:  ACCUNEB Take 1 ampule by nebulization every 6 (six) hours as needed for wheezing.   albuterol 108 (90 Base) MCG/ACT  inhaler Commonly known as:  PROVENTIL HFA;VENTOLIN HFA Inhale 2 puffs into the lungs every 6 (six) hours as needed for wheezing or shortness of breath.   apixaban 5 MG Tabs tablet Commonly known as:  ELIQUIS Take 1 tablet (5 mg total) by mouth 2 (two) times daily.   buPROPion 300 MG 24 hr tablet Commonly known as:  WELLBUTRIN XL Take 300 mg by mouth daily.   dicyclomine 10 MG capsule Commonly known as:  BENTYL Take 2 capsules (20 mg total) by mouth 3 (three) times daily as needed for spasms (diarrhea).   diltiazem 120 MG 24 hr capsule Commonly known as:  CARDIZEM CD Take 1 capsule (120 mg total) by mouth daily. What changed:    medication strength  how much to take   escitalopram 5 MG tablet Commonly known as:  LEXAPRO Take 1 tablet (5 mg total) by mouth daily. What changed:  how much to take   fluticasone 50 MCG/ACT nasal spray Commonly known as:  FLONASE Place 2 sprays into both nostrils daily.   furosemide 40 MG tablet Commonly known as:  LASIX Take 1 tablet (40 mg total) by mouth daily. What changed:  how much to take   glipiZIDE 10 MG tablet Commonly known as:  GLUCOTROL Take 10 mg by mouth 2 (two) times daily.   guaifenesin 400 MG Tabs tablet Commonly known as:  MUCUS RELIEF Take 1 tablet (400 mg total) by mouth every 6 (six) hours as needed (mucus and chest congestion). What changed:    how much to take  when to take this  reasons to take this   LEVEMIR FLEXTOUCH 100 UNIT/ML Pen Generic drug:  Insulin Detemir Inject 10 Units into the skin daily.   loratadine 10 MG tablet Commonly known as:  CLARITIN Take 10 mg by mouth daily.   metoprolol tartrate 25 MG tablet Commonly known as:  LOPRESSOR Take 1 tablet (25 mg total) by mouth 2 (two) times daily. What changed:    medication strength  how much to take  Another medication with the same name was removed. Continue taking this medication, and follow the directions you see here.   nystatin  powder Commonly known as:  MYCOSTATIN/NYSTOP Apply topically 3 (three) times daily for 7 days.   pantoprazole 40 MG tablet Commonly known as:  PROTONIX Take 1 tablet (40 mg total) by mouth daily before breakfast.   polyethylene glycol packet Commonly known as:  MIRALAX / GLYCOLAX Take 17 g by mouth daily as needed.   potassium chloride SA 20 MEQ tablet Commonly known as:  K-DUR,KLOR-CON Take 1 tablet (20 mEq total) by mouth daily.   simethicone 80  MG chewable tablet Commonly known as:  MYLICON Chew 1 tablet (80 mg total) by mouth 4 (four) times daily as needed for flatulence.   SPIRIVA RESPIMAT 1.25 MCG/ACT Aers Generic drug:  Tiotropium Bromide Monohydrate Inhale 2 sprays into the lungs daily.   traZODone 50 MG tablet Commonly known as:  DESYREL Take 1 tablet (50 mg total) by mouth at bedtime for 30 days. What changed:  how much to take      Follow-up Information    [name], MD. Schedule an appointment as soon as possible for a visit in 2 week(s).   Specialty:  Internal Medicine Why:  Hospital Follow Up  Contact information: [address] [place] [address] [phone]        Danie Binder, MD. Schedule an appointment as soon as possible for a visit in 1 month(s).   Specialty:  Gastroenterology Why:  Arrange for colonoscopy Contact information: [address] [place] [address] [phone]        Jonnie Kind, MD. Schedule an appointment as soon as possible for a visit in 1 month(s).   Specialties:  Obstetrics and Gynecology, Radiology Why:  follow up ovarian cyst  Contact information: [place] [address] [phone]          Allergies  Allergen Reactions  . Latex Itching and Rash   Allergies as of 09/13/2018      Reactions   Latex Itching, Rash      Medication List    STOP taking these medications   benzonatate 100 MG capsule Commonly known as:  TESSALON PERLES    HYDROcodone-homatropine 5-1.5 MG/5ML syrup Commonly known as:  HYCODAN   pioglitazone 45 MG tablet Commonly known as:  ACTOS     TAKE these medications   acetaminophen 500 MG tablet Commonly known as:  TYLENOL Take 1,000 mg by mouth every 8 (eight) hours as needed for mild pain or headache.   albuterol 1.25 MG/3ML nebulizer solution Commonly known as:  ACCUNEB Take 1 ampule by nebulization every 6 (six) hours as needed for wheezing.   albuterol 108 (90 Base) MCG/ACT inhaler Commonly known as:  PROVENTIL HFA;VENTOLIN HFA Inhale 2 puffs into the lungs every 6 (six) hours as needed for wheezing or shortness of breath.   apixaban 5 MG Tabs tablet Commonly known as:  ELIQUIS Take 1 tablet (5 mg total) by mouth 2 (two) times daily.   buPROPion 300 MG 24 hr tablet Commonly known as:  WELLBUTRIN XL Take 300 mg by mouth daily.   dicyclomine 10 MG capsule Commonly known as:  BENTYL Take 2 capsules (20 mg total) by mouth 3 (three) times daily as needed for spasms (diarrhea).   diltiazem 120 MG 24 hr capsule Commonly known as:  CARDIZEM CD Take 1 capsule (120 mg total) by mouth daily. What changed:    medication strength  how much to take   escitalopram 5 MG tablet Commonly known as:  LEXAPRO Take 1 tablet (5 mg total) by mouth daily. What changed:  how much to take   fluticasone 50 MCG/ACT nasal spray Commonly known as:  FLONASE Place 2 sprays into both nostrils daily.   furosemide 40 MG tablet Commonly known as:  LASIX Take 1 tablet (40 mg total) by mouth daily. What changed:  how much to take   glipiZIDE 10 MG tablet Commonly known as:  GLUCOTROL Take 10 mg by mouth 2 (two) times daily.   guaifenesin 400 MG  Tabs tablet Commonly known as:  MUCUS RELIEF Take 1 tablet (400 mg total) by mouth every 6 (six) hours as needed (mucus and chest congestion). What changed:    how much to take  when to take this  reasons to take this   LEVEMIR FLEXTOUCH 100 UNIT/ML  Pen Generic drug:  Insulin Detemir Inject 10 Units into the skin daily.   loratadine 10 MG tablet Commonly known as:  CLARITIN Take 10 mg by mouth daily.   metoprolol tartrate 25 MG tablet Commonly known as:  LOPRESSOR Take 1 tablet (25 mg total) by mouth 2 (two) times daily. What changed:    medication strength  how much to take  Another medication with the same name was removed. Continue taking this medication, and follow the directions you see here.   nystatin powder Commonly known as:  MYCOSTATIN/NYSTOP Apply topically 3 (three) times daily for 7 days.   pantoprazole 40 MG tablet Commonly known as:  PROTONIX Take 1 tablet (40 mg total) by mouth daily before breakfast.   polyethylene glycol packet Commonly known as:  MIRALAX / GLYCOLAX Take 17 g by mouth daily as needed.   potassium chloride SA 20 MEQ tablet Commonly known as:  K-DUR,KLOR-CON Take 1 tablet (20 mEq total) by mouth daily.   simethicone 80 MG chewable tablet Commonly known as:  MYLICON Chew 1 tablet (80 mg total) by mouth 4 (four) times daily as needed for flatulence.   SPIRIVA RESPIMAT 1.25 MCG/ACT Aers Generic drug:  Tiotropium Bromide Monohydrate Inhale 2 sprays into the lungs daily.   traZODone 50 MG tablet Commonly known as:  DESYREL Take 1 tablet (50 mg total) by mouth at bedtime for 30 days. What changed:  how much to take       Procedures/Studies: Dg Chest 2 View  Result Date: 08/16/2018 CLINICAL DATA:  Acute cough and shortness of breath. EXAM: CHEST - 2 VIEW COMPARISON:  08/10/2018 and prior radiographs FINDINGS: Cardiomegaly again noted. RIGHT pleural effusion and RIGHT basilar atelectasis again noted with slightly improved RIGHT basilar aeration. There is no evidence of pneumothorax. No acute bony abnormalities identified. IMPRESSION: Slightly improved RIGHT basilar aeration since 08/10/2018 with continued RIGHT pleural effusion and RIGHT basilar atelectasis. Cardiomegaly  Electronically Signed   By: Margarette Canada M.D.   On: 08/16/2018 18:51   Ct Abdomen Pelvis W Contrast  Result Date: 09/03/2018 CLINICAL DATA:  68 year old female with left-sided abdominal pain and rectal bleeding. EXAM: CT ABDOMEN AND PELVIS WITH CONTRAST TECHNIQUE: Multidetector CT imaging of the abdomen and pelvis was performed using the standard protocol following bolus administration of intravenous contrast. CONTRAST:  170m ISOVUE-300 IOPAMIDOL (ISOVUE-300) INJECTION 61% COMPARISON:  Renal ultrasound dated 04/05/2018. FINDINGS: Lower chest: There is a small right pleural effusion with associated right lung base partial atelectasis. Pneumonia is not excluded. Clinical correlation is recommended. There is mild cardiomegaly. There is calcification of the mitral annulus. No intra-abdominal free air or free fluid. Hepatobiliary: The liver is unremarkable. Cholecystectomy. There is dilatation of the common bile duct measuring up to 19 mm. No calcified gallstone. Pancreas: The pancreas is poorly visualized and appears atrophic with fatty infiltration. 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 Further evaluation with MRI on a nonemergent basis recommended. Spleen: Normal in size without focal abnormality. Adrenals/Urinary Tract: The adrenal glands are unremarkable. Mild renal parenchyma  atrophy and cortical irregularity and thinning. Subcentimeter left renal hypodense lesions are too small to characterize. There is no hydronephrosis on either side. There is symmetric enhancement and excretion of contrast by both kidneys. The visualized ureters appear unremarkable. Minimal thickened appearance of the bladder wall, likely related to underdistention. Stomach/Bowel: Loose stool throughout the colon compatible with diarrheal state. There is diffuse enhancement of the colonic mucosa  most consistent with colitis. There is sigmoid diverticulosis without active inflammatory changes. There is no bowel obstruction. The appendix is not identified with certainty and likely surgically absent. Vascular/Lymphatic: There is moderate aortoiliac atherosclerotic disease. No portal venous gas. There is no adenopathy. Reproductive: Hysterectomy. There is a 4.5 x 4.2 cm multicystic lesion in the right ovary. Further evaluation with ultrasound on a nonemergent basis recommended. Depending on the ultrasound finding additional evaluation with MRI the required. A 1.3 cm rounded nodular density in the left anterior pelvis (series 3, image 67) likely represents a lymph node. Other: Mild diffuse subcutaneous edema. No fluid collection. Musculoskeletal: Degenerative changes of the spine and osteopenia. Partially visualized left femoral fixation rod. No acute osseous pathology. Grade 1 L4-L5 anterolisthesis. IMPRESSION: 1. Diarrheal state with findings of colitis. Correlation with clinical exam and stool cultures recommended. No bowel obstruction. 2. Sigmoid diverticulosis without active inflammatory changes. 3. Cystic lesion in the region of the body of the pancreas. Further evaluation with MRI on a nonemergent basis recommended. 4. Right ovarian multicystic lesion. Further evaluation with pelvic ultrasound recommended. 5. Small right pleural effusion with associated right lung base partial atelectasis. Pneumonia is not excluded. Clinical correlation is recommended. Electronically Signed   By: Anner Crete M.D.   On: 09/03/2018 02:15   Mr 3d Recon At Scanner  Result Date: 09/09/2018 CLINICAL DATA:  Pancreatic body lesion for further characterization. EXAM: MRI ABDOMEN WITHOUT AND WITH CONTRAST (INCLUDING MRCP) TECHNIQUE: Multiplanar multisequence MR imaging of the abdomen was performed both before and after the administration of intravenous contrast. Heavily T2-weighted images of the biliary and pancreatic  ducts were obtained, and three-dimensional MRCP images were rendered by post processing. CONTRAST:  10 cc Gadavist COMPARISON:  CT scan 09/03/2018 FINDINGS: Despite efforts by the technologist and patient, motion artifact is present on today's exam and could not be eliminated. This reduces exam sensitivity and specificity. Lower chest: Moderate right and small left pleural effusions with cardiomegaly. Passive atelectasis of the right lower lobe. Hepatobiliary: Mild extrahepatic biliary dilatation, CBD 1.1 cm in diameter, likely a physiologic response to prior cholecystectomy. No significant abnormal hepatic enhancement. Pancreas: Severe pancreatic atrophy. Multiple dilated side ducts along the pancreatic tail and body, most at or below 5 mm in diameter. The more confluent lesion shown on recent CT has resolved and probably represented a lymph node adjacent to the pancreas or a small fluid collection. No worrisome pancreatic findings are currently identified. Spleen:  Unremarkable Adrenals/Urinary Tract: 2 small hypodense lesions in the left kidney are likely cysts although technically too small to characterize due to the degree of motion artifact. Adrenal glands normal. Stomach/Bowel: Unremarkable Vascular/Lymphatic: Aortoiliac atherosclerotic vascular disease. No pathologic adenopathy is identified. Other:  Diffuse subcutaneous edema especially along the flanks. Musculoskeletal: Unremarkable IMPRESSION: 1. The lesion of concern on prior CT has resolved and was probably a lymph node or small transient fluid collection. Although there are some tiny side duct dilatations along the pancreas, these are highly likely to be the result of prior pancreatitis and did not require further workup. Underlying pancreatic atrophy noted. 2. Moderate right  and small left pleural effusion. 3. Cardiomegaly. 4. Extrahepatic biliary prominence is likely a physiologic response to cholecystectomy. 5.  Aortic Atherosclerosis (ICD10-I70.0).  6. Subcutaneous edema along the flanks. 7. Despite efforts by the technologist and patient, motion artifact is present on today's exam and could not be eliminated. This reduces exam sensitivity and specificity. Electronically Signed   By: Van Clines M.D.   On: 09/09/2018 15:42   Mr Abdomen Mrcp Moise Boring Contast  Result Date: 09/09/2018 CLINICAL DATA:  Pancreatic body lesion for further characterization. EXAM: MRI ABDOMEN WITHOUT AND WITH CONTRAST (INCLUDING MRCP) TECHNIQUE: Multiplanar multisequence MR imaging of the abdomen was performed both before and after the administration of intravenous contrast. Heavily T2-weighted images of the biliary and pancreatic ducts were obtained, and three-dimensional MRCP images were rendered by post processing. CONTRAST:  10 cc Gadavist COMPARISON:  CT scan 09/03/2018 FINDINGS: Despite efforts by the technologist and patient, motion artifact is present on today's exam and could not be eliminated. This reduces exam sensitivity and specificity. Lower chest: Moderate right and small left pleural effusions with cardiomegaly. Passive atelectasis of the right lower lobe. Hepatobiliary: Mild extrahepatic biliary dilatation, CBD 1.1 cm in diameter, likely a physiologic response to prior cholecystectomy. No significant abnormal hepatic enhancement. Pancreas: Severe pancreatic atrophy. Multiple dilated side ducts along the pancreatic tail and body, most at or below 5 mm in diameter. The more confluent lesion shown on recent CT has resolved and probably represented a lymph node adjacent to the pancreas or a small fluid collection. No worrisome pancreatic findings are currently identified. Spleen:  Unremarkable Adrenals/Urinary Tract: 2 small hypodense lesions in the left kidney are likely cysts although technically too small to characterize due to the degree of motion artifact. Adrenal glands normal. Stomach/Bowel: Unremarkable Vascular/Lymphatic: Aortoiliac atherosclerotic vascular  disease. No pathologic adenopathy is identified. Other:  Diffuse subcutaneous edema especially along the flanks. Musculoskeletal: Unremarkable IMPRESSION: 1. The lesion of concern on prior CT has resolved and was probably a lymph node or small transient fluid collection. Although there are some tiny side duct dilatations along the pancreas, these are highly likely to be the result of prior pancreatitis and did not require further workup. Underlying pancreatic atrophy noted. 2. Moderate right and small left pleural effusion. 3. Cardiomegaly. 4. Extrahepatic biliary prominence is likely a physiologic response to cholecystectomy. 5.  Aortic Atherosclerosis (ICD10-I70.0). 6. Subcutaneous edema along the flanks. 7. Despite efforts by the technologist and patient, motion artifact is present on today's exam and could not be eliminated. This reduces exam sensitivity and specificity. Electronically Signed   By: Van Clines M.D.   On: 09/09/2018 15:42   US Thyroid  Result Date: 09/09/2018 CLINICAL DATA:  Incidental on CT. Thyroid nodule incidentally noted on chest CT from 08/10/2018. Family history of thyroid cancer. EXAM: THYROID ULTRASOUND TECHNIQUE: Ultrasound examination of the thyroid gland and adjacent soft tissues was performed. COMPARISON:  Chest CT-08/10/2018 FINDINGS: Parenchymal Echotexture: Mildly heterogenous Isthmus: Normal in size measures 0.4 cm in diameter Right lobe: Normal in size measuring 3.9 x 2.1 x 2.3 cm Left lobe: Normal in size measuring 4.1 x 1.8 x 1.9 cm _________________________________________________________ Estimated total number of nodules >/= 1 cm: 2 Number of spongiform nodules >/=  2 cm not described below (TR1): 0 Number of mixed cystic and solid nodules >/= 1.5 cm not described below (TR2): 0 _________________________________________________________ Nodule # 1: Location: Right; Mid - this nodule correlates with the nodule questioned on preceding chest CT Maximum size: 2.3 cm;  Other  2 dimensions: 1.9 x 1.7 cm Composition: mixed cystic and solid (1) Echogenicity: isoechoic (1) Shape: not taller-than-wide (0) Margins: smooth (0) Echogenic foci: none (0) ACR TI-RADS total points: 2. ACR TI-RADS risk category: TR2 (2 points). ACR TI-RADS recommendations: This nodule does NOT meet TI-RADS criteria for biopsy or dedicated follow-up. _________________________________________________________ There is an approximately 1.1 x 1.0 x 1.0 cm partially solid though predominantly cystic nodule within the inferior pole the left lobe of the thyroid which does not meet imaging criteria to recommend percutaneous sampling or continued dedicated follow-up. IMPRESSION: 1. Findings suggestive of multinodular goiter. 2. The approximately 2.3 cm mixed cystic and solid nodule within the right lobe of the thyroid, correlating with the nodule seen on preceding chest CT, does not meet imaging criteria to recommend percutaneous sampling or continued dedicated follow-up. The above is in keeping with the ACR TI-RADS recommendations - J Am Coll Radiol 2017;14:587-595. Electronically Signed   By: Sandi Mariscal M.D.   On: 09/09/2018 16:04   US Pelvic Complete With Transvaginal  Result Date: 09/09/2018 CLINICAL DATA:  Right ovarian mass seen on CT scan from September 03, 2018. Hysterectomy 37 years ago. EXAM: TRANSABDOMINAL AND TRANSVAGINAL ULTRASOUND OF PELVIS TECHNIQUE: Both transabdominal and transvaginal ultrasound examinations of the pelvis were performed. Transabdominal technique was performed for global imaging of the pelvis including uterus, ovaries, adnexal regions, and pelvic cul-de-sac. It was necessary to proceed with endovaginal exam following the transabdominal exam to visualize the adnexa. COMPARISON:  None FINDINGS: Uterus Surgically absent Right ovary The right adnexal mass seen on the recent CT scan is not visualized on this study. Left ovary Not visualized. Other findings No abnormal free fluid.  IMPRESSION: 1. The right adnexal mass seen on recent CT imaging could not be visualized on this study. Given the lack of visualization with ultrasound, a pelvic MR could further evaluate if clinically warranted. 2. The left ovary was not visualized either. Electronically Signed   By: Dorise Bullion III M.D   On: 09/09/2018 15:54   Dg Esophagus Inc Scout Chest & Delayed Img Single Cm (ba Or Sol)  Result Date: 08/23/2018 CLINICAL DATA:  68 year old female with history of dysphagia. EXAM: ESOPHOGRAM/BARIUM SWALLOW TECHNIQUE: Single contrast examination was performed using  thin barium. FLUOROSCOPY TIME:  Fluoroscopy Time:  1 minutes Radiation Exposure Index (if provided by the fluoroscopic device): 6.6 mGy COMPARISON:  None. FINDINGS: Examination was severely limited by lack of patient mobility. Patient was imaged in a semi upright supine position with thin barium. Repeated swallows demonstrated intermittent failure to fully propagate a normal primary peristaltic wave, with numerous tertiary contractions. No definite esophageal mass, stricture or esophageal ring. No definite hiatal hernia. A barium tablet was administered, but the patient was unable to swallow the tablet. IMPRESSION: 1. Limited examination, as above. Today's study demonstrates nonspecific esophageal motility disorder with intermittent tertiary contractions. Electronically Signed   By: Vinnie Langton M.D.   On: 08/23/2018 15:48     Subjective: No new complaints, feels she is doing well  Discharge Exam: Vitals:   09/13/18 0811 09/13/18 0840  BP: 122/66   Pulse: 78   Resp: 20   Temp: 98 F (36.7 C)   SpO2: 98% 98%   Vitals:   09/12/18 2230 09/12/18 2328 09/13/18 0811 09/13/18 0840  BP: (!) 105/52 130/67 122/66   Pulse: 75 69 78   Resp:   20   Temp: 98.3 F (36.8 C)  98 F (36.7 C)   TempSrc: Oral  Oral  SpO2: 98%  98% 98%  Weight:      Height:       General exam: Alert, awake, oriented x 3 Respiratory system: Clear to  auscultation. Respiratory effort normal. Cardiovascular system:irregularly irregular. No murmurs, rubs, gallops. Gastrointestinal system: Abdomen is nondistended, soft and nontender. No organomegaly or masses felt. Normal bowel sounds heard. Central nervous system: Alert and oriented. No focal neurological deficits. Extremities: No C/C/E, +pedal pulses Skin: No rashes, lesions or ulcers Psychiatry: Judgement and insight appear normal. Mood & affect appropriate.     The results of significant diagnostics from this hospitalization (including imaging, microbiology, ancillary and laboratory) are listed below for reference.    Microbiology: No results found for this or any previous visit (from the past 240 hour(s)).   Labs: BNP (last 3 results) Recent Labs    07/21/18 1109 08/10/18 0746 08/16/18 1848  BNP 751.0* 247.0* 943.2*   Basic Metabolic Panel: Recent Labs  Lab 09/07/18 0443  NA 137  K 4.1  CL 105  CO2 23  GLUCOSE 169*  BUN 17  CREATININE 0.83  CALCIUM 8.4*   Liver Function Tests: No results for input(s): AST, ALT, ALKPHOS, BILITOT, PROT, ALBUMIN in the last 168 hours. No results for input(s): LIPASE, AMYLASE in the last 168 hours. No results for input(s): AMMONIA in the last 168 hours. CBC: Recent Labs  Lab 09/09/18 0628 09/10/18 0433 09/11/18 0613 09/12/18 0703 09/13/18 0436  WBC 6.9 6.9 7.8 9.1 8.4  NEUTROABS 4.4 3.9 4.8 6.3 5.6  HGB 7.9* 8.3* 7.9* 9.5* 8.0*  HCT 27.7* 28.9* 27.8* 33.3* 27.9*  MCV 93.9 94.8 94.6 94.3 94.3  PLT 283 301 306 332 325   Cardiac Enzymes: No results for input(s): CKTOTAL, CKMB, CKMBINDEX, TROPONINI in the last 168 hours. BNP: Invalid input(s): POCBNP CBG: Recent Labs  Lab 09/12/18 1157 09/12/18 1619 09/12/18 2126 09/13/18 0753 09/13/18 1132  GLUCAP 180* 181* 259* 136* 181*   D-Dimer No results for input(s): DDIMER in the last 72 hours. Hgb A1c No results for input(s): HGBA1C in the last 72 hours. Lipid Profile No  results for input(s): CHOL, HDL, LDLCALC, TRIG, CHOLHDL, LDLDIRECT in the last 72 hours. Thyroid function studies No results for input(s): TSH, T4TOTAL, T3FREE, THYROIDAB in the last 72 hours.  Invalid input(s): FREET3 Anemia work up No results for input(s): VITAMINB12, FOLATE, FERRITIN, TIBC, IRON, RETICCTPCT in the last 72 hours. Urinalysis    Component Value Date/Time   COLORURINE YELLOW 08/16/2018 2100   APPEARANCEUR CLEAR 08/16/2018 2100   LABSPEC 1.024 08/16/2018 2100   PHURINE 6.0 08/16/2018 2100   GLUCOSEU >=500 (A) 08/16/2018 2100   HGBUR MODERATE (A) 08/16/2018 2100   [place] NEGATIVE 08/16/2018 2100   KETONESUR 5 (A) 08/16/2018 2100   PROTEINUR NEGATIVE 08/16/2018 2100   UROBILINOGEN 0.2 12/01/2007 1025   NITRITE NEGATIVE 08/16/2018 2100   LEUKOCYTESUR TRACE (A) 08/16/2018 2100   Sepsis Labs Invalid input(s): PROCALCITONIN,  WBC,  LACTICIDVEN Microbiology No results found for this or any previous visit (from the past 240 hour(s)).  Time coordinating discharge: 38 mins  SIGNED:  Kathie Dike, MD  [facility] 09/13/2018, 12:25 PM

## 2018-09-13 NOTE — Telephone Encounter (Signed)
PATIENT SCHEDULED AND NURSE WILL LET HER KNOW APPT DATE AND TIME

## 2018-09-13 NOTE — Clinical Social Work Note (Addendum)
LCSW following. Spoke with Gerald Stabs at [facility] this AM to inquire on insurance authorization status. Per Gerald Stabs, they have not yet received auth from pt's [place] [facility] [place]. Per Gerald Stabs, they will let us know as soon as they receive it. Per MD, pt medically ready for dc.  Received a call from [facility] stating that they have now received auth and pt can transfer today. Updated MD and pt's RN, Caryl Pina, who will call report and EMS when pt ready. DC clinical will be sent electronically. There are no other CSW needs for dc.

## 2018-09-13 NOTE — Care Management Important Message (Signed)
Important Message  Patient Details  Name: [name] MRN: [id number] Date of Birth: March 01, 1951   Medicare Important Message Given:  Yes    Sherald Barge, RN 09/13/2018, 11:28 AM

## 2018-09-13 NOTE — Progress Notes (Signed)
Patient to discharge back to [facility] today. Called report to receiving nurse. EMS to transport patient at discharge since she has been unable to get out of bed without max assist and could not tolerate ambulation or sitting up in chair this morning. Receiving nurse aware she will be coming via EMS transport when they arrive. Discharge packet prepared to be sent with patient. Patient aware of discharge plan and states she will call her husband to notify him. Donavan Foil, RN

## 2018-09-15 DIAGNOSIS — F5105 Insomnia due to other mental disorder: Secondary | ICD-10-CM | POA: Diagnosis not present

## 2018-09-15 DIAGNOSIS — F331 Major depressive disorder, recurrent, moderate: Secondary | ICD-10-CM | POA: Diagnosis not present

## 2018-09-16 DIAGNOSIS — K51911 Ulcerative colitis, unspecified with rectal bleeding: Secondary | ICD-10-CM | POA: Diagnosis not present

## 2018-09-16 DIAGNOSIS — I10 Essential (primary) hypertension: Secondary | ICD-10-CM | POA: Diagnosis not present

## 2018-09-16 DIAGNOSIS — E119 Type 2 diabetes mellitus without complications: Secondary | ICD-10-CM | POA: Diagnosis not present

## 2018-09-16 DIAGNOSIS — I4891 Unspecified atrial fibrillation: Secondary | ICD-10-CM | POA: Diagnosis not present

## 2018-09-20 DIAGNOSIS — N390 Urinary tract infection, site not specified: Secondary | ICD-10-CM | POA: Diagnosis not present

## 2018-09-20 DIAGNOSIS — R319 Hematuria, unspecified: Secondary | ICD-10-CM | POA: Diagnosis not present

## 2018-09-21 LAB — GLUCOSE, CAPILLARY
GLUCOSE-CAPILLARY: 185 mg/dL — AB (ref 70–99)
GLUCOSE-CAPILLARY: 144 mg/dL — AB (ref 70–99)
Glucose-Capillary: 243 mg/dL — ABNORMAL HIGH (ref 70–99)
Glucose-Capillary: 177 mg/dL — ABNORMAL HIGH (ref 70–99)

## 2018-09-22 DIAGNOSIS — I10 Essential (primary) hypertension: Secondary | ICD-10-CM | POA: Diagnosis not present

## 2018-09-22 DIAGNOSIS — D649 Anemia, unspecified: Secondary | ICD-10-CM | POA: Diagnosis not present

## 2018-09-28 DIAGNOSIS — D649 Anemia, unspecified: Secondary | ICD-10-CM | POA: Diagnosis not present

## 2018-09-28 DIAGNOSIS — N3000 Acute cystitis without hematuria: Secondary | ICD-10-CM | POA: Diagnosis not present

## 2018-09-28 DIAGNOSIS — K5900 Constipation, unspecified: Secondary | ICD-10-CM | POA: Diagnosis not present

## 2018-09-28 DIAGNOSIS — I5032 Chronic diastolic (congestive) heart failure: Secondary | ICD-10-CM | POA: Diagnosis not present

## 2018-09-28 DIAGNOSIS — D509 Iron deficiency anemia, unspecified: Secondary | ICD-10-CM | POA: Diagnosis not present

## 2018-09-28 DIAGNOSIS — D519 Vitamin B12 deficiency anemia, unspecified: Secondary | ICD-10-CM | POA: Diagnosis not present

## 2018-09-30 DIAGNOSIS — D649 Anemia, unspecified: Secondary | ICD-10-CM | POA: Diagnosis not present

## 2018-09-30 DIAGNOSIS — I10 Essential (primary) hypertension: Secondary | ICD-10-CM | POA: Diagnosis not present

## 2018-10-08 DIAGNOSIS — I10 Essential (primary) hypertension: Secondary | ICD-10-CM | POA: Diagnosis not present

## 2018-10-08 DIAGNOSIS — D649 Anemia, unspecified: Secondary | ICD-10-CM | POA: Diagnosis not present

## 2018-10-09 DIAGNOSIS — D649 Anemia, unspecified: Secondary | ICD-10-CM | POA: Diagnosis not present

## 2018-10-09 DIAGNOSIS — I10 Essential (primary) hypertension: Secondary | ICD-10-CM | POA: Diagnosis not present

## 2018-10-10 DIAGNOSIS — S7292XA Unspecified fracture of left femur, initial encounter for closed fracture: Secondary | ICD-10-CM | POA: Diagnosis not present

## 2018-10-11 DIAGNOSIS — J449 Chronic obstructive pulmonary disease, unspecified: Secondary | ICD-10-CM | POA: Diagnosis not present

## 2018-10-11 DIAGNOSIS — I5032 Chronic diastolic (congestive) heart failure: Secondary | ICD-10-CM | POA: Diagnosis not present

## 2018-10-11 DIAGNOSIS — Z7901 Long term (current) use of anticoagulants: Secondary | ICD-10-CM | POA: Diagnosis not present

## 2018-10-11 DIAGNOSIS — Z87440 Personal history of urinary (tract) infections: Secondary | ICD-10-CM | POA: Diagnosis not present

## 2018-10-11 DIAGNOSIS — Z6841 Body Mass Index (BMI) 40.0 and over, adult: Secondary | ICD-10-CM | POA: Diagnosis not present

## 2018-10-11 DIAGNOSIS — I482 Chronic atrial fibrillation, unspecified: Secondary | ICD-10-CM | POA: Diagnosis not present

## 2018-10-11 DIAGNOSIS — N183 Chronic kidney disease, stage 3 (moderate): Secondary | ICD-10-CM | POA: Diagnosis not present

## 2018-10-11 DIAGNOSIS — M810 Age-related osteoporosis without current pathological fracture: Secondary | ICD-10-CM | POA: Diagnosis not present

## 2018-10-11 DIAGNOSIS — I130 Hypertensive heart and chronic kidney disease with heart failure and stage 1 through stage 4 chronic kidney disease, or unspecified chronic kidney disease: Secondary | ICD-10-CM | POA: Diagnosis not present

## 2018-10-11 DIAGNOSIS — K51911 Ulcerative colitis, unspecified with rectal bleeding: Secondary | ICD-10-CM | POA: Diagnosis not present

## 2018-10-11 DIAGNOSIS — E1122 Type 2 diabetes mellitus with diabetic chronic kidney disease: Secondary | ICD-10-CM | POA: Diagnosis not present

## 2018-10-11 DIAGNOSIS — F329 Major depressive disorder, single episode, unspecified: Secondary | ICD-10-CM | POA: Diagnosis not present

## 2018-10-12 DIAGNOSIS — Z6841 Body Mass Index (BMI) 40.0 and over, adult: Secondary | ICD-10-CM | POA: Diagnosis not present

## 2018-10-12 DIAGNOSIS — Z87440 Personal history of urinary (tract) infections: Secondary | ICD-10-CM | POA: Diagnosis not present

## 2018-10-12 DIAGNOSIS — K51911 Ulcerative colitis, unspecified with rectal bleeding: Secondary | ICD-10-CM | POA: Diagnosis not present

## 2018-10-12 DIAGNOSIS — N183 Chronic kidney disease, stage 3 (moderate): Secondary | ICD-10-CM | POA: Diagnosis not present

## 2018-10-12 DIAGNOSIS — F329 Major depressive disorder, single episode, unspecified: Secondary | ICD-10-CM | POA: Diagnosis not present

## 2018-10-12 DIAGNOSIS — J449 Chronic obstructive pulmonary disease, unspecified: Secondary | ICD-10-CM | POA: Diagnosis not present

## 2018-10-12 DIAGNOSIS — I5032 Chronic diastolic (congestive) heart failure: Secondary | ICD-10-CM | POA: Diagnosis not present

## 2018-10-12 DIAGNOSIS — I482 Chronic atrial fibrillation, unspecified: Secondary | ICD-10-CM | POA: Diagnosis not present

## 2018-10-12 DIAGNOSIS — E1122 Type 2 diabetes mellitus with diabetic chronic kidney disease: Secondary | ICD-10-CM | POA: Diagnosis not present

## 2018-10-12 DIAGNOSIS — Z7901 Long term (current) use of anticoagulants: Secondary | ICD-10-CM | POA: Diagnosis not present

## 2018-10-12 DIAGNOSIS — M810 Age-related osteoporosis without current pathological fracture: Secondary | ICD-10-CM | POA: Diagnosis not present

## 2018-10-12 DIAGNOSIS — I130 Hypertensive heart and chronic kidney disease with heart failure and stage 1 through stage 4 chronic kidney disease, or unspecified chronic kidney disease: Secondary | ICD-10-CM | POA: Diagnosis not present

## 2018-10-14 DIAGNOSIS — E1122 Type 2 diabetes mellitus with diabetic chronic kidney disease: Secondary | ICD-10-CM | POA: Diagnosis not present

## 2018-10-14 DIAGNOSIS — I482 Chronic atrial fibrillation, unspecified: Secondary | ICD-10-CM | POA: Diagnosis not present

## 2018-10-14 DIAGNOSIS — Z87440 Personal history of urinary (tract) infections: Secondary | ICD-10-CM | POA: Diagnosis not present

## 2018-10-14 DIAGNOSIS — N183 Chronic kidney disease, stage 3 (moderate): Secondary | ICD-10-CM | POA: Diagnosis not present

## 2018-10-14 DIAGNOSIS — Z6841 Body Mass Index (BMI) 40.0 and over, adult: Secondary | ICD-10-CM | POA: Diagnosis not present

## 2018-10-14 DIAGNOSIS — M810 Age-related osteoporosis without current pathological fracture: Secondary | ICD-10-CM | POA: Diagnosis not present

## 2018-10-14 DIAGNOSIS — I130 Hypertensive heart and chronic kidney disease with heart failure and stage 1 through stage 4 chronic kidney disease, or unspecified chronic kidney disease: Secondary | ICD-10-CM | POA: Diagnosis not present

## 2018-10-14 DIAGNOSIS — K51911 Ulcerative colitis, unspecified with rectal bleeding: Secondary | ICD-10-CM | POA: Diagnosis not present

## 2018-10-14 DIAGNOSIS — Z7901 Long term (current) use of anticoagulants: Secondary | ICD-10-CM | POA: Diagnosis not present

## 2018-10-14 DIAGNOSIS — I5032 Chronic diastolic (congestive) heart failure: Secondary | ICD-10-CM | POA: Diagnosis not present

## 2018-10-14 DIAGNOSIS — J449 Chronic obstructive pulmonary disease, unspecified: Secondary | ICD-10-CM | POA: Diagnosis not present

## 2018-10-14 DIAGNOSIS — F329 Major depressive disorder, single episode, unspecified: Secondary | ICD-10-CM | POA: Diagnosis not present

## 2018-10-15 DIAGNOSIS — E1169 Type 2 diabetes mellitus with other specified complication: Secondary | ICD-10-CM | POA: Diagnosis not present

## 2018-10-15 DIAGNOSIS — J449 Chronic obstructive pulmonary disease, unspecified: Secondary | ICD-10-CM | POA: Diagnosis not present

## 2018-10-15 DIAGNOSIS — F329 Major depressive disorder, single episode, unspecified: Secondary | ICD-10-CM | POA: Diagnosis not present

## 2018-10-15 DIAGNOSIS — I482 Chronic atrial fibrillation, unspecified: Secondary | ICD-10-CM | POA: Diagnosis not present

## 2018-10-15 DIAGNOSIS — M810 Age-related osteoporosis without current pathological fracture: Secondary | ICD-10-CM | POA: Diagnosis not present

## 2018-10-15 DIAGNOSIS — R300 Dysuria: Secondary | ICD-10-CM | POA: Diagnosis not present

## 2018-10-15 DIAGNOSIS — Z7901 Long term (current) use of anticoagulants: Secondary | ICD-10-CM | POA: Diagnosis not present

## 2018-10-15 DIAGNOSIS — I10 Essential (primary) hypertension: Secondary | ICD-10-CM | POA: Diagnosis not present

## 2018-10-15 DIAGNOSIS — N183 Chronic kidney disease, stage 3 (moderate): Secondary | ICD-10-CM | POA: Diagnosis not present

## 2018-10-15 DIAGNOSIS — I5032 Chronic diastolic (congestive) heart failure: Secondary | ICD-10-CM | POA: Diagnosis not present

## 2018-10-15 DIAGNOSIS — K51911 Ulcerative colitis, unspecified with rectal bleeding: Secondary | ICD-10-CM | POA: Diagnosis not present

## 2018-10-15 DIAGNOSIS — E1122 Type 2 diabetes mellitus with diabetic chronic kidney disease: Secondary | ICD-10-CM | POA: Diagnosis not present

## 2018-10-15 DIAGNOSIS — Z87440 Personal history of urinary (tract) infections: Secondary | ICD-10-CM | POA: Diagnosis not present

## 2018-10-15 DIAGNOSIS — I130 Hypertensive heart and chronic kidney disease with heart failure and stage 1 through stage 4 chronic kidney disease, or unspecified chronic kidney disease: Secondary | ICD-10-CM | POA: Diagnosis not present

## 2018-10-15 DIAGNOSIS — K922 Gastrointestinal hemorrhage, unspecified: Secondary | ICD-10-CM | POA: Diagnosis not present

## 2018-10-15 DIAGNOSIS — Z6841 Body Mass Index (BMI) 40.0 and over, adult: Secondary | ICD-10-CM | POA: Diagnosis not present

## 2018-10-18 DIAGNOSIS — N183 Chronic kidney disease, stage 3 (moderate): Secondary | ICD-10-CM | POA: Diagnosis not present

## 2018-10-18 DIAGNOSIS — J449 Chronic obstructive pulmonary disease, unspecified: Secondary | ICD-10-CM | POA: Diagnosis not present

## 2018-10-18 DIAGNOSIS — I5032 Chronic diastolic (congestive) heart failure: Secondary | ICD-10-CM | POA: Diagnosis not present

## 2018-10-18 DIAGNOSIS — I130 Hypertensive heart and chronic kidney disease with heart failure and stage 1 through stage 4 chronic kidney disease, or unspecified chronic kidney disease: Secondary | ICD-10-CM | POA: Diagnosis not present

## 2018-10-18 DIAGNOSIS — Z6841 Body Mass Index (BMI) 40.0 and over, adult: Secondary | ICD-10-CM | POA: Diagnosis not present

## 2018-10-18 DIAGNOSIS — K51911 Ulcerative colitis, unspecified with rectal bleeding: Secondary | ICD-10-CM | POA: Diagnosis not present

## 2018-10-18 DIAGNOSIS — E1122 Type 2 diabetes mellitus with diabetic chronic kidney disease: Secondary | ICD-10-CM | POA: Diagnosis not present

## 2018-10-18 DIAGNOSIS — M810 Age-related osteoporosis without current pathological fracture: Secondary | ICD-10-CM | POA: Diagnosis not present

## 2018-10-18 DIAGNOSIS — F329 Major depressive disorder, single episode, unspecified: Secondary | ICD-10-CM | POA: Diagnosis not present

## 2018-10-18 DIAGNOSIS — Z7901 Long term (current) use of anticoagulants: Secondary | ICD-10-CM | POA: Diagnosis not present

## 2018-10-18 DIAGNOSIS — I482 Chronic atrial fibrillation, unspecified: Secondary | ICD-10-CM | POA: Diagnosis not present

## 2018-10-18 DIAGNOSIS — Z87440 Personal history of urinary (tract) infections: Secondary | ICD-10-CM | POA: Diagnosis not present

## 2018-10-19 DIAGNOSIS — Z7901 Long term (current) use of anticoagulants: Secondary | ICD-10-CM | POA: Diagnosis not present

## 2018-10-19 DIAGNOSIS — Z87440 Personal history of urinary (tract) infections: Secondary | ICD-10-CM | POA: Diagnosis not present

## 2018-10-19 DIAGNOSIS — K51911 Ulcerative colitis, unspecified with rectal bleeding: Secondary | ICD-10-CM | POA: Diagnosis not present

## 2018-10-19 DIAGNOSIS — J449 Chronic obstructive pulmonary disease, unspecified: Secondary | ICD-10-CM | POA: Diagnosis not present

## 2018-10-19 DIAGNOSIS — I130 Hypertensive heart and chronic kidney disease with heart failure and stage 1 through stage 4 chronic kidney disease, or unspecified chronic kidney disease: Secondary | ICD-10-CM | POA: Diagnosis not present

## 2018-10-19 DIAGNOSIS — I5032 Chronic diastolic (congestive) heart failure: Secondary | ICD-10-CM | POA: Diagnosis not present

## 2018-10-19 DIAGNOSIS — I482 Chronic atrial fibrillation, unspecified: Secondary | ICD-10-CM | POA: Diagnosis not present

## 2018-10-19 DIAGNOSIS — F329 Major depressive disorder, single episode, unspecified: Secondary | ICD-10-CM | POA: Diagnosis not present

## 2018-10-19 DIAGNOSIS — N183 Chronic kidney disease, stage 3 (moderate): Secondary | ICD-10-CM | POA: Diagnosis not present

## 2018-10-19 DIAGNOSIS — E1122 Type 2 diabetes mellitus with diabetic chronic kidney disease: Secondary | ICD-10-CM | POA: Diagnosis not present

## 2018-10-19 DIAGNOSIS — Z6841 Body Mass Index (BMI) 40.0 and over, adult: Secondary | ICD-10-CM | POA: Diagnosis not present

## 2018-10-19 DIAGNOSIS — M810 Age-related osteoporosis without current pathological fracture: Secondary | ICD-10-CM | POA: Diagnosis not present

## 2018-10-20 ENCOUNTER — Ambulatory Visit: Payer: BLUE CROSS/BLUE SHIELD | Admitting: Gastroenterology

## 2018-10-20 ENCOUNTER — Ambulatory Visit: Payer: BLUE CROSS/BLUE SHIELD | Admitting: Student

## 2018-10-20 DIAGNOSIS — F329 Major depressive disorder, single episode, unspecified: Secondary | ICD-10-CM | POA: Diagnosis not present

## 2018-10-20 DIAGNOSIS — Z87440 Personal history of urinary (tract) infections: Secondary | ICD-10-CM | POA: Diagnosis not present

## 2018-10-20 DIAGNOSIS — Z7901 Long term (current) use of anticoagulants: Secondary | ICD-10-CM | POA: Diagnosis not present

## 2018-10-20 DIAGNOSIS — M810 Age-related osteoporosis without current pathological fracture: Secondary | ICD-10-CM | POA: Diagnosis not present

## 2018-10-20 DIAGNOSIS — I130 Hypertensive heart and chronic kidney disease with heart failure and stage 1 through stage 4 chronic kidney disease, or unspecified chronic kidney disease: Secondary | ICD-10-CM | POA: Diagnosis not present

## 2018-10-20 DIAGNOSIS — N183 Chronic kidney disease, stage 3 (moderate): Secondary | ICD-10-CM | POA: Diagnosis not present

## 2018-10-20 DIAGNOSIS — E1122 Type 2 diabetes mellitus with diabetic chronic kidney disease: Secondary | ICD-10-CM | POA: Diagnosis not present

## 2018-10-20 DIAGNOSIS — I5032 Chronic diastolic (congestive) heart failure: Secondary | ICD-10-CM | POA: Diagnosis not present

## 2018-10-20 DIAGNOSIS — Z6841 Body Mass Index (BMI) 40.0 and over, adult: Secondary | ICD-10-CM | POA: Diagnosis not present

## 2018-10-20 DIAGNOSIS — K51911 Ulcerative colitis, unspecified with rectal bleeding: Secondary | ICD-10-CM | POA: Diagnosis not present

## 2018-10-20 DIAGNOSIS — I482 Chronic atrial fibrillation, unspecified: Secondary | ICD-10-CM | POA: Diagnosis not present

## 2018-10-20 DIAGNOSIS — J449 Chronic obstructive pulmonary disease, unspecified: Secondary | ICD-10-CM | POA: Diagnosis not present

## 2018-10-22 ENCOUNTER — Other Ambulatory Visit: Payer: Self-pay

## 2018-10-22 ENCOUNTER — Encounter (HOSPITAL_COMMUNITY): Payer: Self-pay | Admitting: *Deleted

## 2018-10-22 ENCOUNTER — Emergency Department (HOSPITAL_COMMUNITY)
Admission: EM | Admit: 2018-10-22 | Discharge: 2018-10-23 | Disposition: A | Discharge status: Home / Self Care | Payer: BLUE CROSS/BLUE SHIELD | Attending: Emergency Medicine | Admitting: Emergency Medicine

## 2018-10-22 DIAGNOSIS — I130 Hypertensive heart and chronic kidney disease with heart failure and stage 1 through stage 4 chronic kidney disease, or unspecified chronic kidney disease: Secondary | ICD-10-CM | POA: Diagnosis not present

## 2018-10-22 DIAGNOSIS — Z9049 Acquired absence of other specified parts of digestive tract: Secondary | ICD-10-CM | POA: Insufficient documentation

## 2018-10-22 DIAGNOSIS — Z91040 Latex allergy status: Secondary | ICD-10-CM | POA: Insufficient documentation

## 2018-10-22 DIAGNOSIS — N3001 Acute cystitis with hematuria: Secondary | ICD-10-CM | POA: Diagnosis not present

## 2018-10-22 DIAGNOSIS — Z79899 Other long term (current) drug therapy: Secondary | ICD-10-CM | POA: Diagnosis not present

## 2018-10-22 DIAGNOSIS — N183 Chronic kidney disease, stage 3 (moderate): Secondary | ICD-10-CM | POA: Insufficient documentation

## 2018-10-22 DIAGNOSIS — Z7984 Long term (current) use of oral hypoglycemic drugs: Secondary | ICD-10-CM | POA: Diagnosis not present

## 2018-10-22 DIAGNOSIS — K51911 Ulcerative colitis, unspecified with rectal bleeding: Secondary | ICD-10-CM | POA: Diagnosis not present

## 2018-10-22 DIAGNOSIS — F329 Major depressive disorder, single episode, unspecified: Secondary | ICD-10-CM | POA: Insufficient documentation

## 2018-10-22 DIAGNOSIS — J449 Chronic obstructive pulmonary disease, unspecified: Secondary | ICD-10-CM | POA: Diagnosis not present

## 2018-10-22 DIAGNOSIS — I5030 Unspecified diastolic (congestive) heart failure: Secondary | ICD-10-CM | POA: Diagnosis not present

## 2018-10-22 DIAGNOSIS — I482 Chronic atrial fibrillation, unspecified: Secondary | ICD-10-CM | POA: Diagnosis not present

## 2018-10-22 DIAGNOSIS — E1122 Type 2 diabetes mellitus with diabetic chronic kidney disease: Secondary | ICD-10-CM | POA: Diagnosis not present

## 2018-10-22 DIAGNOSIS — Z7901 Long term (current) use of anticoagulants: Secondary | ICD-10-CM | POA: Diagnosis not present

## 2018-10-22 DIAGNOSIS — M810 Age-related osteoporosis without current pathological fracture: Secondary | ICD-10-CM | POA: Diagnosis not present

## 2018-10-22 DIAGNOSIS — R300 Dysuria: Secondary | ICD-10-CM | POA: Diagnosis not present

## 2018-10-22 DIAGNOSIS — Z87440 Personal history of urinary (tract) infections: Secondary | ICD-10-CM | POA: Diagnosis not present

## 2018-10-22 DIAGNOSIS — Z6841 Body Mass Index (BMI) 40.0 and over, adult: Secondary | ICD-10-CM | POA: Diagnosis not present

## 2018-10-22 DIAGNOSIS — I5032 Chronic diastolic (congestive) heart failure: Secondary | ICD-10-CM | POA: Diagnosis not present

## 2018-10-22 LAB — URINALYSIS, ROUTINE W REFLEX MICROSCOPIC
Bilirubin Urine: NEGATIVE
Glucose, UA: 50 mg/dL — AB
Hgb urine dipstick: NEGATIVE
Ketones, ur: NEGATIVE mg/dL
Nitrite: NEGATIVE
Protein, ur: NEGATIVE mg/dL
Specific Gravity, Urine: 1.02 (ref 1.005–1.030)
WBC, UA: >50 WBC/hpf — ABNORMAL HIGH (ref 0–5)
pH: 5 (ref 5.0–8.0)

## 2018-10-22 LAB — BASIC METABOLIC PANEL
Anion gap: 6 (ref 5–15)
BUN: 19 mg/dL (ref 8–23)
CALCIUM: 8.6 mg/dL — AB (ref 8.9–10.3)
CO2: 25 mmol/L (ref 22–32)
Chloride: 103 mmol/L (ref 98–111)
Creatinine, Ser: 0.81 mg/dL (ref 0.44–1.00)
GFR calc Af Amer: >60 mL/min (ref >60)
GFR calc non Af Amer: >60 mL/min (ref >60)
Glucose, Bld: 160 mg/dL — ABNORMAL HIGH (ref 70–99)
Potassium: 4.7 mmol/L (ref 3.5–5.1)
Sodium: 134 mmol/L — ABNORMAL LOW (ref 135–145)

## 2018-10-22 LAB — CBC WITH DIFFERENTIAL/PLATELET
Abs Immature Granulocytes: 0.05 10*3/uL (ref 0.00–0.07)
Basophils Absolute: 0.1 10*3/uL (ref 0.0–0.1)
Basophils Relative: 1 %
EOS PCT: 3 %
Eosinophils Absolute: 0.2 10*3/uL (ref 0.0–0.5)
HCT: 33.7 % — ABNORMAL LOW (ref 36.0–46.0)
Hemoglobin: 9.8 g/dL — ABNORMAL LOW (ref 12.0–15.0)
Immature Granulocytes: 1 %
Lymphocytes Relative: 24 %
Lymphs Abs: 2.1 10*3/uL (ref 0.7–4.0)
MCH: 26.3 pg (ref 26.0–34.0)
MCHC: 29.1 g/dL — ABNORMAL LOW (ref 30.0–36.0)
MCV: 90.3 fL (ref 80.0–100.0)
Monocytes Absolute: 0.7 10*3/uL (ref 0.1–1.0)
Monocytes Relative: 8 %
Neutro Abs: 5.7 10*3/uL (ref 1.7–7.7)
Neutrophils Relative %: 63 %
Platelets: 309 10*3/uL (ref 150–400)
RBC: 3.73 MIL/uL — ABNORMAL LOW (ref 3.87–5.11)
RDW: 16.3 % — ABNORMAL HIGH (ref 11.5–15.5)
WBC: 8.8 10*3/uL (ref 4.0–10.5)
nRBC: 0 % (ref 0.0–0.2)

## 2018-10-22 LAB — LACTIC ACID, PLASMA: LACTIC ACID, VENOUS: 1.5 mmol/L (ref 0.5–1.9)

## 2018-10-22 MED ORDER — DIPHENHYDRAMINE HCL 50 MG/ML IJ SOLN
25.0000 mg | Freq: Once | INTRAMUSCULAR | Status: AC
  Administered 2018-10-22: 25 mg via INTRAVENOUS
  Filled 2018-10-22: qty 1

## 2018-10-22 MED ORDER — CIPROFLOXACIN HCL 500 MG PO TABS: 500.0000 mg | ORAL_TABLET | Freq: Two times a day (BID) | ORAL | 0 refills | Status: DC

## 2018-10-22 MED ORDER — FLUCONAZOLE 150 MG PO TABS
150.0000 mg | ORAL_TABLET | Freq: Once | ORAL | Status: AC
  Administered 2018-10-22: 150 mg via ORAL
  Filled 2018-10-22: qty 1

## 2018-10-22 MED ORDER — CIPROFLOXACIN IN D5W 400 MG/200ML IV SOLN
400.0000 mg | Freq: Once | INTRAVENOUS | Status: AC
  Administered 2018-10-22: 400 mg via INTRAVENOUS
  Filled 2018-10-22: qty 200

## 2018-10-22 NOTE — Discharge Instructions (Addendum)
As we discussed were unable to see any urine culture results or any notes from [facility].  Although not ideal we will treat you with Cipro another urine culture sent.  Return for any fever chills or any worse symptoms.  We will get culture results probably by Monday.  But she also can contact your primary care doctor on on Monday.  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  Something you may want to consider.  A dose of Diflucan given here to help prevent yeast infection.  We have her urine culture results from here if the Cipro is not going to be effective you will be contacted.

## 2018-10-22 NOTE — ED Notes (Signed)
Pt reports that she has been in rehab to walk with a walker _Pt non ambulatory in ED and made no effort when assisted to bed being lifted and moved by RNs x 2  She reports that she reported a UTI in rehab, "but nobody listened to me"  Was given antibiotics and was called by her PCP last night to be told that her antibiotics would not cover her UTI as she was growing MRSA as well as other pathogens and would need iv antibiotics

## 2018-10-22 NOTE — ED Provider Notes (Signed)
[facility] EMERGENCY DEPARTMENT Provider Note   CSN: [id number] Arrival date & time: 10/22/18  1628    History   Chief Complaint No chief complaint on file.   HPI [name] is a 68 y.o. female.     Patient recently discharged from rehab.  Primary care doctor is with [facility] in [place].  She just got off a 7-day course of Keflex but was contacted by her primary care doctor saying the urine culture that was done when they started on the Keflex was not covering everything.  Patient states she still has dysuria cloudy and cloudy urine.  But denies any fever or chills or any significant abdominal or flank pain or back pain.  Also stated that her primary care doctor who said that she would need to come to the hospital to get IV antibiotics there was some mention of staph aureus in the urine.  Patient has not had any blood cultures.  Patient also feels as if she has a yeast infection but she usually does get them when she is on antibiotics.     Past Medical History:  Diagnosis Date  . Atrial fibrillation ([place])   . CHF (congestive heart failure) ([place])   . Depression   . Diabetes mellitus without complication ([place])   . 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 Hypertension   . Morbid obesity ([place])   . Osteoporosis   . UTI (lower urinary tract infection) 01/2016   Cipro for Klebsiella pneumoniae isolate    Patient Active Problem List   Diagnosis Date Noted  . Hypotension due to hypovolemia   . Gastritis due to nonsteroidal anti-inflammatory drug   . Esophageal dysphagia   . GI bleed 09/03/2018  . Coagulopathy ([place])   . Colitis   . Lower abdominal pain   . Rectal bleeding   . Dehydration 08/16/2018  . Acute on chronic diastolic CHF (congestive heart failure) ([place]) 08/10/2018  . Acute lower UTI 08/10/2018  . Hypokalemia 08/10/2018  . COPD (chronic obstructive pulmonary disease) ([place]) 08/10/2018  . Thyroid lesion 08/10/2018  .  Closed fracture of right ankle 06/10/2018  . Closed fracture of left tibia and fibula with routine healing 04/13/18 06/10/2018  . Unspecified atrial fibrillation ([place]) 04/15/2018  . Chronic diastolic CHF (congestive heart failure) ([place]) 04/15/2018  . CKD (chronic kidney disease), stage III ([place]) 04/15/2018  . Closed right fibular fracture 04/15/2018  . Left tibial fracture 04/14/2018  . CHF exacerbation ([place]) 04/10/2018  . SOB (shortness of breath)   . Acute CHF (congestive heart failure) ([place]) 04/01/2018  . Atrial fibrillation with RVR ([place]) 04/01/2018  . [place] 03/11/2016  . Muscle spasms of lower extremity 03/04/2016  . Acute renal insufficiency 02/26/2016  . History of MRSA infection 02/26/2016  . Diabetes type 2, uncontrolled ([employer]) 02/19/2016  . HTN (hypertension) 02/19/2016  . Fracture, femur, distal ([place]) 02/19/2016  . Fall 02/19/2016  . Depression 02/19/2016  . Closed left femoral fracture, initial encounter 02/19/2016  . Femur fracture, left ([place]) 02/19/2016    Past Surgical History:  Procedure Laterality Date  . ABDOMINAL HYSTERECTOMY    . BIOPSY  09/06/2018   Procedure: BIOPSY;  Surgeon: Danie Binder, MD;  Location[facility];  Service: Endoscopy;;  gastric bx's  . CESAREAN SECTION    . CHOLECYSTECTOMY    . ESOPHAGEAL DILATION  09/06/2018   Procedure: ESOPHAGEAL DILATION;  Surgeon: Danie Binder, MD;  Location: [facility];  Service:  Endoscopy;;  . ESOPHAGOGASTRODUODENOSCOPY (EGD) WITH PROPOFOL N/A 09/06/2018   Procedure: ESOPHAGOGASTRODUODENOSCOPY (EGD) WITH PROPOFOL;  Surgeon: Danie Binder, MD;  Location: [facility];  Service: Endoscopy;  Laterality: N/A;  dilatation  . FEMUR IM NAIL Left 02/20/2016  . FEMUR IM NAIL Left 02/20/2016   Procedure: INTRAMEDULLARY (IM) RETROGRADE FEMORAL NAILING;  Surgeon: Leandrew Koyanagi, MD;  Location: [facility];  Service: Orthopedics;  Laterality: Left;  . PANCREAS SURGERY  1967   1 cyst excised and one cyst drained  . TEE  WITHOUT CARDIOVERSION N/A 04/05/2018   Procedure: TRANSESOPHAGEAL ECHOCARDIOGRAM (TEE);  Surgeon: Dorothy Spark, MD;  Location: [facility];  Service: Cardiovascular;  Laterality: N/A;  . [place]    . WRIST FRACTURE SURGERY       OB History   No obstetric history on file.      Home Medications    Prior to Admission medications   Medication Sig Start Date End Date Taking? Authorizing Provider  acetaminophen (TYLENOL) 500 MG tablet Take 1,000 mg by mouth every 8 (eight) hours as needed for mild pain or headache.    Yes [provider]  albuterol (ACCUNEB) 1.25 MG/3ML nebulizer solution Take 1 ampule by nebulization every 6 (six) hours as needed for wheezing.   Yes [provider]  albuterol (PROVENTIL HFA;VENTOLIN HFA) 108 (90 Base) MCG/ACT inhaler Inhale 2 puffs into the lungs every 6 (six) hours as needed for wheezing or shortness of breath. 08/14/18  Yes Kathie Dike, MD  apixaban (ELIQUIS) 5 MG TABS tablet Take 1 tablet (5 mg total) by mouth 2 (two) times daily. 07/21/18  Yes Herminio Commons, MD  buPROPion (WELLBUTRIN XL) 300 MG 24 hr tablet Take 300 mg by mouth daily. 07/06/13  Yes [provider]  dicyclomine (BENTYL) 10 MG capsule Take 2 capsules (20 mg total) by mouth 3 (three) times daily as needed for spasms (diarrhea). 09/10/18  Yes Johnson, Clanford L, MD  diltiazem (CARDIZEM CD) 120 MG 24 hr capsule Take 1 capsule (120 mg total) by mouth daily. 09/10/18  Yes Johnson, Clanford L, MD  escitalopram (LEXAPRO) 5 MG tablet Take 1 tablet (5 mg total) by mouth daily. Patient taking differently: Take 10 mg by mouth daily.  08/25/18 10/22/18 Yes Shah, Pratik D, DO  ferrous sulfate 325 (65 FE) MG tablet Take 325 mg by mouth daily with breakfast.   Yes [provider]  fluticasone (FLONASE) 50 MCG/ACT nasal spray Place 2 sprays into both nostrils daily.   Yes [provider]  furosemide (LASIX) 40 MG tablet Take 1 tablet  (40 mg total) by mouth daily. Patient taking differently: Take 20 mg by mouth daily.  08/14/18  Yes Kathie Dike, MD  glipiZIDE (GLUCOTROL) 10 MG tablet Take 10 mg by mouth 2 (two) times daily. 03/02/18  Yes [provider]  loratadine (CLARITIN) 10 MG tablet Take 10 mg by mouth daily.   Yes [provider]  metoprolol tartrate (LOPRESSOR) 25 MG tablet Take 1 tablet (25 mg total) by mouth 2 (two) times daily. 09/10/18  Yes Johnson, Clanford L, MD  pantoprazole (PROTONIX) 40 MG tablet Take 1 tablet (40 mg total) by mouth daily before breakfast. Patient taking differently: Take 40 mg by mouth daily as needed (for GERD).  09/11/18  Yes Johnson, Clanford L, MD  polyethylene glycol (MIRALAX / GLYCOLAX) packet Take 17 g by mouth daily as needed for mild constipation or moderate constipation.    Yes [provider]  SPIRIVA RESPIMAT 1.25 MCG/ACT AERS Inhale 2 sprays into the lungs daily. 01/21/18  Yes [provider]  traZODone (DESYREL) 50 MG tablet Take 1 tablet (50 mg total) by mouth at bedtime for 30 days. Patient taking differently: Take 100 mg by mouth at bedtime as needed for sleep.  09/13/18 10/22/18 Yes Kathie Dike, MD  cephALEXin (KEFLEX) 500 MG capsule Take 500 mg by mouth every 12 (twelve) hours. 7 day course prescribed on 10/15/2018 10/15/18   [provider]  guaifenesin (MUCUS RELIEF) 400 MG TABS tablet Take 1 tablet (400 mg total) by mouth every 6 (six) hours as needed (mucus and chest congestion). Patient not taking: Reported on 10/22/2018 09/10/18   Irwin Brakeman L, MD  potassium chloride SA (K-DUR,KLOR-CON) 20 MEQ tablet Take 1 tablet (20 mEq total) by mouth daily. Patient not taking: Reported on 10/22/2018 07/21/18   Herminio Commons, MD  simethicone (MYLICON) 80 MG chewable tablet Chew 1 tablet (80 mg total) by mouth 4 (four) times daily as needed for flatulence. Patient not taking: Reported on 10/22/2018 09/13/18   Kathie Dike, MD     Family History Family History  Problem Relation Age of Onset  . Diabetes Mother        died in her 61's of a stroke  . Cancer Mother   . Stroke Mother   . Breast cancer Mother   . Diabetes Father        died in his 33's of a stroke.  . Stroke Father   . Diabetes Brother        died @ 75 of a stroke.  . Stroke Brother   . Diabetes Maternal Grandmother   . Diabetes Maternal Grandfather   . Diabetes Paternal Grandmother   . Diabetes Paternal Grandfather   . Breast cancer Maternal Aunt     Social History Social History   Tobacco Use  . Smoking status: Never Smoker  . Smokeless tobacco: Never Used  Substance Use Topics  . Alcohol use: No  . Drug use: No     Allergies   Propranolol; Topamax [topiramate]; and Latex   Review of Systems Review of Systems  Constitutional: Negative for chills and fever.  HENT: Negative for rhinorrhea and sore throat.   Eyes: Negative for visual disturbance.  Respiratory: Negative for cough and shortness of breath.   Cardiovascular: Negative for chest pain and leg swelling.  Gastrointestinal: Negative for abdominal pain, diarrhea, nausea and vomiting.  Genitourinary: Positive for dysuria. Negative for flank pain, hematuria and pelvic pain.  Musculoskeletal: Negative for back pain and neck pain.  Skin: Negative for rash.  Neurological: Negative for dizziness, light-headedness and headaches.  Hematological: Does not bruise/bleed easily.  Psychiatric/Behavioral: Negative for confusion.     Physical Exam Updated Vital Signs BP (!) 139/49   Pulse 73   Temp (!) 97.1 F (36.2 C) (Oral)   Resp 20   Ht 1.626 m (5' 4" )   Wt 94.8 kg   SpO2 99%   BMI 35.87 kg/m   Physical Exam Vitals signs and nursing note reviewed.  Constitutional:      General: She is not in acute distress.    Appearance: Normal appearance. She is well-developed.  HENT:     Head: Normocephalic and atraumatic.     Mouth/Throat:     Mouth: Mucous membranes are  moist.  Eyes:     Extraocular Movements: Extraocular movements intact.     Conjunctiva/sclera: Conjunctivae normal.     Pupils: Pupils are  equal, round, and reactive to light.  Neck:     Musculoskeletal: Normal range of motion and neck supple.  Cardiovascular:     Rate and Rhythm: Normal rate and regular rhythm.     Heart sounds: No murmur.  Pulmonary:     Effort: Pulmonary effort is normal. No respiratory distress.     Breath sounds: Normal breath sounds.  Abdominal:     General: Abdomen is flat.     Palpations: Abdomen is soft.     Tenderness: There is no abdominal tenderness.  Musculoskeletal: Normal range of motion.  Skin:    General: Skin is warm and dry.  Neurological:     Mental Status: She is alert and oriented to person, place, and time.      ED Treatments / Results  Labs (all labs ordered are listed, but only abnormal results are displayed) Labs Reviewed  URINALYSIS, ROUTINE W REFLEX MICROSCOPIC - Abnormal; Notable for the following components:      Result Value   Color, Urine AMBER (*)    APPearance TURBID (*)    Glucose, UA 50 (*)    Leukocytes,Ua LARGE (*)    WBC, UA >50 (*)    Bacteria, UA MANY (*)    All other components within normal limits  BASIC METABOLIC PANEL - Abnormal; Notable for the following components:   Sodium 134 (*)    Glucose, Bld 160 (*)    Calcium 8.6 (*)    All other components within normal limits  CBC WITH DIFFERENTIAL/PLATELET - Abnormal; Notable for the following components:   RBC 3.73 (*)    Hemoglobin 9.8 (*)    HCT 33.7 (*)    MCHC 29.1 (*)    RDW 16.3 (*)    All other components within normal limits  URINE CULTURE  LACTIC ACID, PLASMA  LACTIC ACID, PLASMA    EKG None  Radiology No results found.  Procedures Procedures (including critical care time)  Medications Ordered in ED Medications  ciprofloxacin (CIPRO) IVPB 400 mg (400 mg Intravenous New Bag/Given 10/22/18 2238)  fluconazole (DIFLUCAN) tablet 150 mg (150  mg Oral Given 10/22/18 2242)     Initial Impression / Assessment and Plan / ED Course  I have reviewed the triage vital signs and the nursing notes.  Pertinent labs & imaging results that were available during my care of the patient were reviewed by me and considered in my medical decision making (see chart for details).        Unfortunately [facility] physician notes as well as lab work not visible through epic.  Being late on Friday evening do not have anyone to contact.  Did asked patient if she uses my chart she does not use that.  Based on that I had a discussion with her that her urinalysis is suggestive of urinary tract infection we were definitely sending it for another culture but we were completely in the dark what she was growing from her last culture when she was on her course of Keflex which she completed.  Patient certainly nontoxic no acute distress.  No fever no chills no CVA tenderness nothing to suggest pyelonephritis or serious infection at this time.  Patient just wanted to try another antibiotic she agreed with the repeat culture and then she will follow-up with her doctors on Monday or we may have our cultures back by then and will contact her.  We will treat her with Cipro for now twice daily with dose IV provided here patient  gets concerned about getting yeast infections thinks that she may have one developing so she received a dose of Diflucan here orally.  Patient lives in the [place] area she will return for any new or worse symptoms.  Final Clinical Impressions(s) / ED Diagnoses   Final diagnoses:  Acute cystitis with hematuria    ED Discharge Orders    None       Fredia Sorrow, MD 10/22/18 2321

## 2018-10-22 NOTE — ED Triage Notes (Signed)
Pt reports her PCP, Dr. Fara Olden, from [facility] in [place] sent her to the hospital due to having multi resistant UTI that oral antibiotics haven't cured. Pt reports dysuria, cloudy urine, incontinent episodes of urine that she went to see her PCP about last Friday. Pt was given oral antibiotics at that time but when the culture returned pt was called and told to come to hospital for IV antibiotics. Denies fever.

## 2018-10-22 NOTE — ED Notes (Signed)
Pt reports Here a lot and just got out of rehab  She is wheel chair bound and non ambulatory at this time

## 2018-10-23 LAB — LACTIC ACID, PLASMA: Lactic Acid, Venous: 1.2 mmol/L (ref 0.5–1.9)

## 2018-10-24 LAB — URINE CULTURE

## 2018-10-25 NOTE — Progress Notes (Deleted)
68 year old female hospitalized in Jan 2020 with rectal bleeding in setting of chronic Eliquis, found to have colitis on CT. Pancreatic and right ovarian multicystic lesions will also need follow-up.    EGD 09/06/2018 during hospitalization  with moderate Schatzki ring s/p dilatation, mild gastritis s/p biopsy (path with chronic inactive gastritis), non-bleeding duodenal diverticulum.   Needs colonoscopy scheduled.

## 2018-10-26 ENCOUNTER — Other Ambulatory Visit (HOSPITAL_COMMUNITY)
Admission: RE | Admit: 2018-10-26 | Discharge: 2018-10-26 | Disposition: A | Discharge status: Home / Self Care | Payer: BLUE CROSS/BLUE SHIELD | Source: Ambulatory Visit | Attending: Internal Medicine | Admitting: Internal Medicine

## 2018-10-26 ENCOUNTER — Telehealth (HOSPITAL_COMMUNITY): Payer: Self-pay

## 2018-10-26 ENCOUNTER — Ambulatory Visit: Payer: BLUE CROSS/BLUE SHIELD | Admitting: Gastroenterology

## 2018-10-26 DIAGNOSIS — N390 Urinary tract infection, site not specified: Secondary | ICD-10-CM | POA: Diagnosis not present

## 2018-10-26 DIAGNOSIS — K51911 Ulcerative colitis, unspecified with rectal bleeding: Secondary | ICD-10-CM | POA: Diagnosis not present

## 2018-10-26 DIAGNOSIS — N183 Chronic kidney disease, stage 3 (moderate): Secondary | ICD-10-CM | POA: Diagnosis not present

## 2018-10-26 DIAGNOSIS — J449 Chronic obstructive pulmonary disease, unspecified: Secondary | ICD-10-CM | POA: Diagnosis not present

## 2018-10-26 DIAGNOSIS — Z87440 Personal history of urinary (tract) infections: Secondary | ICD-10-CM | POA: Diagnosis not present

## 2018-10-26 DIAGNOSIS — I5032 Chronic diastolic (congestive) heart failure: Secondary | ICD-10-CM | POA: Diagnosis not present

## 2018-10-26 DIAGNOSIS — I482 Chronic atrial fibrillation, unspecified: Secondary | ICD-10-CM | POA: Diagnosis not present

## 2018-10-26 DIAGNOSIS — I130 Hypertensive heart and chronic kidney disease with heart failure and stage 1 through stage 4 chronic kidney disease, or unspecified chronic kidney disease: Secondary | ICD-10-CM | POA: Diagnosis not present

## 2018-10-26 DIAGNOSIS — F329 Major depressive disorder, single episode, unspecified: Secondary | ICD-10-CM | POA: Diagnosis not present

## 2018-10-26 DIAGNOSIS — E1122 Type 2 diabetes mellitus with diabetic chronic kidney disease: Secondary | ICD-10-CM | POA: Diagnosis not present

## 2018-10-26 DIAGNOSIS — Z6841 Body Mass Index (BMI) 40.0 and over, adult: Secondary | ICD-10-CM | POA: Diagnosis not present

## 2018-10-26 DIAGNOSIS — M810 Age-related osteoporosis without current pathological fracture: Secondary | ICD-10-CM | POA: Diagnosis not present

## 2018-10-26 DIAGNOSIS — Z7901 Long term (current) use of anticoagulants: Secondary | ICD-10-CM | POA: Diagnosis not present

## 2018-10-26 LAB — URINALYSIS, ROUTINE W REFLEX MICROSCOPIC
BILIRUBIN URINE: NEGATIVE
Glucose, UA: 50 mg/dL — AB
Hgb urine dipstick: NEGATIVE
Ketones, ur: NEGATIVE mg/dL
Nitrite: NEGATIVE
Protein, ur: 30 mg/dL — AB
Specific Gravity, Urine: 1.017 (ref 1.005–1.030)
pH: 5 (ref 5.0–8.0)

## 2018-10-29 ENCOUNTER — Other Ambulatory Visit: Payer: Self-pay

## 2018-10-29 ENCOUNTER — Emergency Department (HOSPITAL_COMMUNITY)
Admission: EM | Admit: 2018-10-29 | Discharge: 2018-10-29 | Disposition: A | Discharge status: Home / Self Care | Payer: BLUE CROSS/BLUE SHIELD | Attending: Emergency Medicine | Admitting: Emergency Medicine

## 2018-10-29 ENCOUNTER — Encounter (HOSPITAL_COMMUNITY): Payer: Self-pay | Admitting: Emergency Medicine

## 2018-10-29 DIAGNOSIS — M810 Age-related osteoporosis without current pathological fracture: Secondary | ICD-10-CM | POA: Diagnosis not present

## 2018-10-29 DIAGNOSIS — E1122 Type 2 diabetes mellitus with diabetic chronic kidney disease: Secondary | ICD-10-CM | POA: Diagnosis not present

## 2018-10-29 DIAGNOSIS — E119 Type 2 diabetes mellitus without complications: Secondary | ICD-10-CM | POA: Insufficient documentation

## 2018-10-29 DIAGNOSIS — Z91040 Latex allergy status: Secondary | ICD-10-CM | POA: Insufficient documentation

## 2018-10-29 DIAGNOSIS — Z79899 Other long term (current) drug therapy: Secondary | ICD-10-CM | POA: Diagnosis not present

## 2018-10-29 DIAGNOSIS — I482 Chronic atrial fibrillation, unspecified: Secondary | ICD-10-CM | POA: Diagnosis not present

## 2018-10-29 DIAGNOSIS — Z87440 Personal history of urinary (tract) infections: Secondary | ICD-10-CM | POA: Diagnosis not present

## 2018-10-29 DIAGNOSIS — R300 Dysuria: Secondary | ICD-10-CM | POA: Diagnosis not present

## 2018-10-29 DIAGNOSIS — I5032 Chronic diastolic (congestive) heart failure: Secondary | ICD-10-CM | POA: Diagnosis not present

## 2018-10-29 DIAGNOSIS — Z7901 Long term (current) use of anticoagulants: Secondary | ICD-10-CM | POA: Insufficient documentation

## 2018-10-29 DIAGNOSIS — N183 Chronic kidney disease, stage 3 (moderate): Secondary | ICD-10-CM | POA: Diagnosis not present

## 2018-10-29 DIAGNOSIS — N3090 Cystitis, unspecified without hematuria: Secondary | ICD-10-CM

## 2018-10-29 DIAGNOSIS — J449 Chronic obstructive pulmonary disease, unspecified: Secondary | ICD-10-CM | POA: Diagnosis not present

## 2018-10-29 DIAGNOSIS — Z6841 Body Mass Index (BMI) 40.0 and over, adult: Secondary | ICD-10-CM | POA: Diagnosis not present

## 2018-10-29 DIAGNOSIS — F329 Major depressive disorder, single episode, unspecified: Secondary | ICD-10-CM | POA: Diagnosis not present

## 2018-10-29 DIAGNOSIS — I130 Hypertensive heart and chronic kidney disease with heart failure and stage 1 through stage 4 chronic kidney disease, or unspecified chronic kidney disease: Secondary | ICD-10-CM | POA: Diagnosis not present

## 2018-10-29 DIAGNOSIS — K51911 Ulcerative colitis, unspecified with rectal bleeding: Secondary | ICD-10-CM | POA: Diagnosis not present

## 2018-10-29 DIAGNOSIS — I110 Hypertensive heart disease with heart failure: Secondary | ICD-10-CM | POA: Insufficient documentation

## 2018-10-29 LAB — URINALYSIS, ROUTINE W REFLEX MICROSCOPIC
Bilirubin Urine: NEGATIVE
Glucose, UA: 50 mg/dL — AB
Hgb urine dipstick: NEGATIVE
Ketones, ur: NEGATIVE mg/dL
NITRITE: POSITIVE — AB
Protein, ur: NEGATIVE mg/dL
Specific Gravity, Urine: 1.013 (ref 1.005–1.030)
WBC, UA: >50 WBC/hpf — ABNORMAL HIGH (ref 0–5)
pH: 5 (ref 5.0–8.0)

## 2018-10-29 LAB — CBC WITH DIFFERENTIAL/PLATELET
Abs Immature Granulocytes: 0.03 10*3/uL (ref 0.00–0.07)
BASOS ABS: 0 10*3/uL (ref 0.0–0.1)
Basophils Relative: 1 %
EOS PCT: 3 %
Eosinophils Absolute: 0.2 10*3/uL (ref 0.0–0.5)
HCT: 29 % — ABNORMAL LOW (ref 36.0–46.0)
Hemoglobin: 8.8 g/dL — ABNORMAL LOW (ref 12.0–15.0)
Immature Granulocytes: 0 %
Lymphocytes Relative: 26 %
Lymphs Abs: 1.7 10*3/uL (ref 0.7–4.0)
MCH: 27 pg (ref 26.0–34.0)
MCHC: 30.3 g/dL (ref 30.0–36.0)
MCV: 89 fL (ref 80.0–100.0)
Monocytes Absolute: 0.8 10*3/uL (ref 0.1–1.0)
Monocytes Relative: 12 %
Neutro Abs: 3.9 10*3/uL (ref 1.7–7.7)
Neutrophils Relative %: 58 %
Platelets: 279 10*3/uL (ref 150–400)
RBC: 3.26 MIL/uL — ABNORMAL LOW (ref 3.87–5.11)
RDW: 16.6 % — ABNORMAL HIGH (ref 11.5–15.5)
WBC: 6.7 10*3/uL (ref 4.0–10.5)
nRBC: 0 % (ref 0.0–0.2)

## 2018-10-29 LAB — BASIC METABOLIC PANEL
Anion gap: 6 (ref 5–15)
BUN: 24 mg/dL — ABNORMAL HIGH (ref 8–23)
CO2: 24 mmol/L (ref 22–32)
Calcium: 8.6 mg/dL — ABNORMAL LOW (ref 8.9–10.3)
Chloride: 102 mmol/L (ref 98–111)
Creatinine, Ser: 0.86 mg/dL (ref 0.44–1.00)
GFR calc Af Amer: >60 mL/min (ref >60)
GFR calc non Af Amer: >60 mL/min (ref >60)
Glucose, Bld: 261 mg/dL — ABNORMAL HIGH (ref 70–99)
Potassium: 5 mmol/L (ref 3.5–5.1)
Sodium: 132 mmol/L — ABNORMAL LOW (ref 135–145)

## 2018-10-29 LAB — LACTIC ACID, PLASMA
Lactic Acid, Venous: 1.3 mmol/L (ref 0.5–1.9)
Lactic Acid, Venous: 1.1 mmol/L (ref 0.5–1.9)

## 2018-10-29 MED ORDER — FLUCONAZOLE 150 MG PO TABS: 150.0000 mg | ORAL_TABLET | Freq: Once | ORAL | 0 refills | Status: AC

## 2018-10-29 MED ORDER — FOSFOMYCIN TROMETHAMINE 3 G PO PACK
3.0000 g | PACK | Freq: Once | ORAL | Status: AC
  Administered 2018-10-29: 3 g via ORAL
  Filled 2018-10-29: qty 3

## 2018-10-29 NOTE — ED Provider Notes (Signed)
[facility] Provider Note   CSN: [id number] Arrival date & time: 10/29/18  1754    History   Chief Complaint Chief Complaint  Patient presents with  . Dysuria    HPI [name] is a 68 y.o. female.     HPI Pt was seen at 1900.  Per pt, c/o gradual onset and persistence of constant dysuria and cloudy urine for the past 4 weeks. Pt states her PMD sent her to the ED "to get admitted for IV antibiotics for a UTI." Pt was evaluated in the ED on 10/22/2018 for this same complaint after completing a course of keflex. Pt was noted to have UTI, but the culture from her PMD's office was unavailable. Pt was prescribed a 7 day course of cipro and told to f/u with PMD. Pt's PMD then sent her back to the ED today "to get admitted for IV antibiotics for a UTI," stating her UC from 10/26/2018 was "positive." (Per Epic, UC +klebsiella pneumonia, pending sensitivities). Pt denies fevers, no rash, no abd pain, no N/V/D, no back/flank pain, no CP/SOB.    Past Medical History:  Diagnosis Date  . Atrial fibrillation ([place])   . CHF (congestive heart failure) ([place])   . Depression   . Diabetes mellitus without complication ([place])   . 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 Hypertension   . Morbid obesity ([place])   . Osteoporosis   . UTI (lower urinary tract infection) 01/2016   Cipro for Klebsiella pneumoniae isolate    Patient Active Problem List   Diagnosis Date Noted  . Hypotension due to hypovolemia   . Gastritis due to nonsteroidal anti-inflammatory drug   . Esophageal dysphagia   . GI bleed 09/03/2018  . Coagulopathy ([place])   . Colitis   . Lower abdominal pain   . Rectal bleeding   . Dehydration 08/16/2018  . Acute on chronic diastolic CHF (congestive heart failure) ([place]) 08/10/2018  . Acute lower UTI 08/10/2018  . Hypokalemia 08/10/2018  . COPD (chronic obstructive pulmonary disease) ([place]) 08/10/2018  . Thyroid lesion 08/10/2018   . Closed fracture of right ankle 06/10/2018  . Closed fracture of left tibia and fibula with routine healing 04/13/18 06/10/2018  . Unspecified atrial fibrillation ([place]) 04/15/2018  . Chronic diastolic CHF (congestive heart failure) ([place]) 04/15/2018  . CKD (chronic kidney disease), stage III ([place]) 04/15/2018  . Closed right fibular fracture 04/15/2018  . Left tibial fracture 04/14/2018  . CHF exacerbation ([place]) 04/10/2018  . SOB (shortness of breath)   . Acute CHF (congestive heart failure) ([place]) 04/01/2018  . Atrial fibrillation with RVR ([place]) 04/01/2018  . [place] 03/11/2016  . Muscle spasms of lower extremity 03/04/2016  . Acute renal insufficiency 02/26/2016  . History of MRSA infection 02/26/2016  . Diabetes type 2, uncontrolled ([place]) 02/19/2016  . HTN (hypertension) 02/19/2016  . Fracture, femur, distal ([place]) 02/19/2016  . Fall 02/19/2016  . Depression 02/19/2016  . Closed left femoral fracture, initial encounter 02/19/2016  . Femur fracture, left ([place]) 02/19/2016    Past Surgical History:  Procedure Laterality Date  . ABDOMINAL HYSTERECTOMY    . BIOPSY  09/06/2018   Procedure: BIOPSY;  Surgeon: Danie Binder, MD;  [facility];  Service: Endoscopy;;  gastric bx's  . CESAREAN SECTION    . CHOLECYSTECTOMY    . ESOPHAGEAL DILATION  09/06/2018   Procedure: ESOPHAGEAL DILATION;  Surgeon: Danie Binder, MD;  Location[facility];  Service: Endoscopy;;  . ESOPHAGOGASTRODUODENOSCOPY (EGD) WITH PROPOFOL N/A 09/06/2018   Procedure: ESOPHAGOGASTRODUODENOSCOPY (EGD) WITH PROPOFOL;  Surgeon: Danie Binder, MD;  [facility];  Service: Endoscopy;  Laterality: N/A;  dilatation  . FEMUR IM NAIL Left 02/20/2016  . FEMUR IM NAIL Left 02/20/2016   Procedure: INTRAMEDULLARY (IM) RETROGRADE FEMORAL NAILING;  Surgeon: Leandrew Koyanagi, MD;  Location: [facility];  Service: Orthopedics;  Laterality: Left;  . PANCREAS SURGERY  1967   1 cyst excised and one cyst drained  . TEE  WITHOUT CARDIOVERSION N/A 04/05/2018   Procedure: TRANSESOPHAGEAL ECHOCARDIOGRAM (TEE);  Surgeon: Dorothy Spark, MD;  Location: [facility];  Service: Cardiovascular;  Laterality: N/A;  . [place]    . WRIST FRACTURE SURGERY       OB History   No obstetric history on file.      Home Medications    Prior to Admission medications   Medication Sig Start Date End Date Taking? Authorizing Provider  acetaminophen (TYLENOL) 500 MG tablet Take 1,000 mg by mouth every 8 (eight) hours as needed for mild pain or headache.    Yes [provider]  albuterol (ACCUNEB) 1.25 MG/3ML nebulizer solution Take 1 ampule by nebulization every 6 (six) hours as needed for wheezing.   Yes [provider]  albuterol (PROVENTIL HFA;VENTOLIN HFA) 108 (90 Base) MCG/ACT inhaler Inhale 2 puffs into the lungs every 6 (six) hours as needed for wheezing or shortness of breath. 08/14/18  Yes Kathie Dike, MD  apixaban (ELIQUIS) 5 MG TABS tablet Take 1 tablet (5 mg total) by mouth 2 (two) times daily. 07/21/18  Yes Herminio Commons, MD  buPROPion (WELLBUTRIN XL) 300 MG 24 hr tablet Take 300 mg by mouth daily. 07/06/13  Yes [provider]  cephALEXin (KEFLEX) 500 MG capsule Take 500 mg by mouth every 12 (twelve) hours. 7 day course prescribed on 10/15/2018 10/15/18  Yes [provider]  dicyclomine (BENTYL) 10 MG capsule Take 2 capsules (20 mg total) by mouth 3 (three) times daily as needed for spasms (diarrhea). 09/10/18  Yes Johnson, Clanford L, MD  diltiazem (CARDIZEM CD) 120 MG 24 hr capsule Take 1 capsule (120 mg total) by mouth daily. 09/10/18  Yes Johnson, Clanford L, MD  escitalopram (LEXAPRO) 5 MG tablet Take 1 tablet (5 mg total) by mouth daily. Patient taking differently: Take 10 mg by mouth daily.  08/25/18 10/29/18 Yes Shah, Pratik D, DO  furosemide (LASIX) 40 MG tablet Take 1 tablet (40 mg total) by mouth daily. Patient taking differently: Take 20 mg by  mouth daily.  08/14/18  Yes Kathie Dike, MD  glipiZIDE (GLUCOTROL) 10 MG tablet Take 10 mg by mouth 2 (two) times daily. 03/02/18  Yes [provider]  loratadine (CLARITIN) 10 MG tablet Take 10 mg by mouth daily.   Yes [provider]  metoprolol tartrate (LOPRESSOR) 25 MG tablet Take 1 tablet (25 mg total) by mouth 2 (two) times daily. 09/10/18  Yes Johnson, Clanford L, MD  pantoprazole (PROTONIX) 40 MG tablet Take 1 tablet (40 mg total) by mouth daily before breakfast. Patient taking differently: Take 40 mg by mouth daily as needed (for GERD).  09/11/18  Yes Johnson, Clanford L, MD  SPIRIVA RESPIMAT 1.25 MCG/ACT AERS Inhale 2 sprays into the lungs daily. 01/21/18  Yes [provider]  traZODone (DESYREL) 50 MG tablet Take 1 tablet (50 mg total) by mouth at bedtime for 30 days. Patient taking differently: Take 100  mg by mouth at bedtime as needed for sleep.  09/13/18 10/29/18 Yes Kathie Dike, MD  ciprofloxacin (CIPRO) 500 MG tablet Take 1 tablet (500 mg total) by mouth 2 (two) times daily. 10/22/18   Fredia Sorrow, MD  ferrous sulfate 325 (65 FE) MG tablet Take 325 mg by mouth daily with breakfast.    [provider]  fluticasone (FLONASE) 50 MCG/ACT nasal spray Place 2 sprays into both nostrils daily.    [provider]  guaifenesin (MUCUS RELIEF) 400 MG TABS tablet Take 1 tablet (400 mg total) by mouth every 6 (six) hours as needed (mucus and chest congestion). Patient not taking: Reported on 10/22/2018 09/10/18   Murlean Iba, MD  polyethylene glycol (MIRALAX / GLYCOLAX) packet Take 17 g by mouth daily as needed for mild constipation or moderate constipation.     [provider]  potassium chloride SA (K-DUR,KLOR-CON) 20 MEQ tablet Take 1 tablet (20 mEq total) by mouth daily. Patient not taking: Reported on 10/22/2018 07/21/18   Herminio Commons, MD  simethicone (MYLICON) 80 MG chewable tablet Chew 1 tablet (80 mg total) by mouth 4  (four) times daily as needed for flatulence. Patient not taking: Reported on 10/22/2018 09/13/18   Kathie Dike, MD    Family History Family History  Problem Relation Age of Onset  . Diabetes Mother        died in her 52's of a stroke  . Cancer Mother   . Stroke Mother   . Breast cancer Mother   . Diabetes Father        died in his 59's of a stroke.  . Stroke Father   . Diabetes Brother        died @ 30 of a stroke.  . Stroke Brother   . Diabetes Maternal Grandmother   . Diabetes Maternal Grandfather   . Diabetes Paternal Grandmother   . Diabetes Paternal Grandfather   . Breast cancer Maternal Aunt     Social History Social History   Tobacco Use  . Smoking status: Never Smoker  . Smokeless tobacco: Never Used  Substance Use Topics  . Alcohol use: No  . Drug use: No     Allergies   Propranolol; Topamax [topiramate]; and Latex   Review of Systems Review of Systems ROS: Statement: All systems negative except as marked or noted in the HPI; Constitutional: Negative for fever and chills. ; ; Eyes: Negative for eye pain, redness and discharge. ; ; ENMT: Negative for ear pain, hoarseness, nasal congestion, sinus pressure and sore throat. ; ; Cardiovascular: Negative for chest pain, palpitations, diaphoresis, dyspnea and peripheral edema. ; ; Respiratory: Negative for cough, wheezing and stridor. ; ; Gastrointestinal: Negative for nausea, vomiting, diarrhea, abdominal pain, blood in stool, hematemesis, jaundice and rectal bleeding. . ; ; Genitourinary: +dysuria. Negative for flank pain and hematuria. ; ; Musculoskeletal: Negative for back pain and neck pain. Negative for swelling and trauma.; ; Skin: Negative for pruritus, rash, abrasions, blisters, bruising and skin lesion.; ; Neuro: Negative for headache, lightheadedness and neck stiffness. Negative for weakness, altered level of consciousness, altered mental status, extremity weakness, paresthesias, involuntary movement, seizure  and syncope.       Physical Exam Updated Vital Signs BP (!) 106/27   Pulse 60   Temp 98 F (36.7 C) (Oral)   Resp 18   Ht 5' 4"  (1.626 m) Comment: Simultaneous filing. User may not have seen previous data.  Wt 95.3 kg Comment: Simultaneous filing. User  may not have seen previous data.  SpO2 100%   BMI 36.05 kg/m     Patient Vitals for the past 24 hrs:  BP Temp Temp src Pulse Resp SpO2 Height Weight  10/29/18 2215 - - - (!) 58 - 100 % - -  10/29/18 2200 (!) 153/59 - - 61 20 100 % - -  10/29/18 2135 - 97.9 F (36.6 C) Oral - - - - -  10/29/18 2130 (!) 141/45 - - 60 - 100 % - -  10/29/18 2100 (!) 148/44 - - 62 - 100 % - -  10/29/18 2000 (!) 139/55 - - 60 - 100 % - -  10/29/18 1930 (!) 152/56 - - 60 - 100 % - -  10/29/18 1900 (!) 106/27 - - 60 - 100 % - -  10/29/18 1807 - - - - - - 5' 4"  (1.626 m) 95.3 kg  10/29/18 1806 (!) 134/50 98 F (36.7 C) Oral (!) 56 18 95 % - -   19:29 Orthostatic Vital Signs AH  Orthostatic Lying   BP- Lying: 152/56  Pulse- Lying: 61      Orthostatic Sitting  BP- Sitting: 153/57  Pulse- Sitting: 63      Orthostatic Standing at 0 minutes  BP- Standing at 0 minutes: (patient states that she can only stand long enough to pivot to wheelchair)     Physical Exam 1905: Physical examination:  Nursing notes reviewed; Vital signs and O2 SAT reviewed;  Constitutional: Well developed, Well nourished, Well hydrated, In no acute distress; Head:  Normocephalic, atraumatic; Eyes: EOMI, PERRL, No scleral icterus; ENMT: Mouth and pharynx normal, Mucous membranes moist; Neck: Supple, Full range of motion, No lymphadenopathy; Cardiovascular: Regular rate and rhythm, No gallop; Respiratory: Breath sounds clear & equal bilaterally, No wheezes.  Speaking full sentences with ease, Normal respiratory effort/excursion; Chest: Nontender, Movement normal; Abdomen: Soft, Nontender, Nondistended, Normal bowel sounds; Genitourinary: No CVA tenderness; Extremities:  Peripheral pulses normal, No tenderness, No edema, No calf edema or asymmetry.; Neuro: AA&Ox3, Major CN grossly intact.  Speech clear. No gross focal motor deficits in extremities.; Skin: Color normal, Warm, Dry.; Psych:  Tearful at times.   ED Treatments / Results  Labs (all labs ordered are listed, but only abnormal results are displayed)   EKG None  Radiology   Procedures Procedures (including critical care time)  Medications Ordered in ED Medications - No data to display   Initial Impression / Assessment and Plan / ED Course  I have reviewed the triage vital signs and the nursing notes.  Pertinent labs & imaging results that were available during my care of the patient were reviewed by me and considered in my medical decision making (see chart for details).     MDM Reviewed: previous chart, nursing note and vitals Reviewed previous: labs and ECG Interpretation: labs, ECG and x-ray   Results for orders placed or performed during the hospital encounter of 32/35/57  Basic metabolic panel  Result Value Ref Range   Sodium 132 (L) 135 - 145 mmol/L   Potassium 5.0 3.5 - 5.1 mmol/L   Chloride 102 98 - 111 mmol/L   CO2 24 22 - 32 mmol/L   Glucose, Bld 261 (H) 70 - 99 mg/dL   BUN 24 (H) 8 - 23 mg/dL   Creatinine, Ser 0.86 0.44 - 1.00 mg/dL   Calcium 8.6 (L) 8.9 - 10.3 mg/dL   GFR calc non Af Amer >60 >60 mL/min   GFR calc Af Amer >  60 >60 mL/min   Anion gap 6 5 - 15  Lactic acid, plasma  Result Value Ref Range   Lactic Acid, Venous 1.3 0.5 - 1.9 mmol/L  Lactic acid, plasma  Result Value Ref Range   Lactic Acid, Venous 1.1 0.5 - 1.9 mmol/L  CBC with Differential  Result Value Ref Range   WBC 6.7 4.0 - 10.5 K/uL   RBC 3.26 (L) 3.87 - 5.11 MIL/uL   Hemoglobin 8.8 (L) 12.0 - 15.0 g/dL   HCT 29.0 (L) 36.0 - 46.0 %   MCV 89.0 80.0 - 100.0 fL   MCH 27.0 26.0 - 34.0 pg   MCHC 30.3 30.0 - 36.0 g/dL   RDW 16.6 (H) 11.5 - 15.5 %   Platelets 279 150 - 400 K/uL   nRBC 0.0  0.0 - 0.2 %   Neutrophils Relative % 58 %   Neutro Abs 3.9 1.7 - 7.7 K/uL   Lymphocytes Relative 26 %   Lymphs Abs 1.7 0.7 - 4.0 K/uL   Monocytes Relative 12 %   Monocytes Absolute 0.8 0.1 - 1.0 K/uL   Eosinophils Relative 3 %   Eosinophils Absolute 0.2 0.0 - 0.5 K/uL   Basophils Relative 1 %   Basophils Absolute 0.0 0.0 - 0.1 K/uL   Immature Granulocytes 0 %   Abs Immature Granulocytes 0.03 0.00 - 0.07 K/uL  Urinalysis, Routine w reflex microscopic  Result Value Ref Range   Color, Urine YELLOW YELLOW   APPearance CLOUDY (A) CLEAR   Specific Gravity, Urine 1.013 1.005 - 1.030   pH 5.0 5.0 - 8.0   Glucose, UA 50 (A) NEGATIVE mg/dL   Hgb urine dipstick NEGATIVE NEGATIVE   Bilirubin Urine NEGATIVE NEGATIVE   Ketones, ur NEGATIVE NEGATIVE mg/dL   Protein, ur NEGATIVE NEGATIVE mg/dL   Nitrite POSITIVE (A) NEGATIVE   Leukocytes,Ua MODERATE (A) NEGATIVE   RBC / HPF 0-5 0 - 5 RBC/hpf   WBC, UA >50 (H) 0 - 5 WBC/hpf   Bacteria, UA FEW (A) NONE SEEN   Squamous Epithelial / LPF 0-5 0 - 5   WBC Clumps PRESENT    Mucus PRESENT      Results for DAWNE, CASALI (MRN [id number]) as of 10/29/2018 21:17  Ref. Range 09/11/2018 06:13 09/12/2018 07:03 09/13/2018 04:36 10/22/2018 21:28 10/29/2018 19:28  Hemoglobin Latest Ref Range: 12.0 - 15.0 g/dL 7.9 (L) 9.5 (L) 8.0 (L) 9.8 (L) 8.8 (L)  HCT Latest Ref Range: 36.0 - 46.0 % 27.8 (L) 33.3 (L) 27.9 (L) 33.7 (L) 29.0 (L)       2115:  Pt has UC report from 10/21/2018: +klebsiella pneumonia, probably ESBL, only sensitive to ertapenem, imipenem, meropenem.   2205:  +UTI, UC and BC pending. WBC count and lactic acid reassuring. No fever while in the ED. Pt not orthostatic on VS. Unclear indication for admission at this time. T/C returned from [facility] ID Dr. Megan Salon, case discussed, including:  HPI, pertinent PM/SHx, VS/PE, dx testing, ED course and treatment:  states no need for hospital admission or for PICC/IV abx as outpatient at this time, requests to dose  fosfomycin 3gm PO x1 dose now as this will cover for the above bacteria, f/u with PMD as outpt. Dx and testing, as well as d/w ID MD, d/w pt.  Questions answered.  Verb understanding, agreeable to d/c home with outpt f/u. Pt requesting rx for diflucan.      Final Clinical Impressions(s) / ED Diagnoses   Final diagnoses:  None  ED Discharge Orders    None       Francine Graven, DO 11/01/18 2113

## 2018-10-29 NOTE — ED Notes (Signed)
Pt refusing for urine sample at this time

## 2018-10-29 NOTE — Discharge Instructions (Addendum)
Take the prescription as directed. You were fully treated for your urinary tract infection while in the Emergency Department today.  Call your regular medical doctor on Monday to schedule a follow up appointment within the next 3 days. Call the Urologist on Monday to schedule a follow up appointment within the next week.  Return to the Emergency Department immediately sooner if worsening.

## 2018-10-29 NOTE — ED Triage Notes (Signed)
Home health wanted to give out pt antibiotics  For UTI However, Dr from [facility], pt reports wanted her here for meds due to her  "they just think with all my issues that they want to get my IV started here and medicines started here"

## 2018-10-31 LAB — CARBAPENEM RESISTANCE PANEL
Carba Resistance IMP Gene: NOT DETECTED
Carba Resistance KPC Gene: NOT DETECTED
Carba Resistance NDM Gene: NOT DETECTED
Carba Resistance OXA48 Gene: NOT DETECTED
Carba Resistance VIM Gene: NOT DETECTED

## 2018-10-31 LAB — URINE CULTURE: Culture: >=100000 — AB

## 2018-11-02 DIAGNOSIS — M810 Age-related osteoporosis without current pathological fracture: Secondary | ICD-10-CM | POA: Diagnosis not present

## 2018-11-02 DIAGNOSIS — I5032 Chronic diastolic (congestive) heart failure: Secondary | ICD-10-CM | POA: Diagnosis not present

## 2018-11-02 DIAGNOSIS — K51911 Ulcerative colitis, unspecified with rectal bleeding: Secondary | ICD-10-CM | POA: Diagnosis not present

## 2018-11-02 DIAGNOSIS — Z7901 Long term (current) use of anticoagulants: Secondary | ICD-10-CM | POA: Diagnosis not present

## 2018-11-02 DIAGNOSIS — E1122 Type 2 diabetes mellitus with diabetic chronic kidney disease: Secondary | ICD-10-CM | POA: Diagnosis not present

## 2018-11-02 DIAGNOSIS — J449 Chronic obstructive pulmonary disease, unspecified: Secondary | ICD-10-CM | POA: Diagnosis not present

## 2018-11-02 DIAGNOSIS — Z6841 Body Mass Index (BMI) 40.0 and over, adult: Secondary | ICD-10-CM | POA: Diagnosis not present

## 2018-11-02 DIAGNOSIS — I482 Chronic atrial fibrillation, unspecified: Secondary | ICD-10-CM | POA: Diagnosis not present

## 2018-11-02 DIAGNOSIS — F329 Major depressive disorder, single episode, unspecified: Secondary | ICD-10-CM | POA: Diagnosis not present

## 2018-11-02 DIAGNOSIS — Z87440 Personal history of urinary (tract) infections: Secondary | ICD-10-CM | POA: Diagnosis not present

## 2018-11-02 DIAGNOSIS — I130 Hypertensive heart and chronic kidney disease with heart failure and stage 1 through stage 4 chronic kidney disease, or unspecified chronic kidney disease: Secondary | ICD-10-CM | POA: Diagnosis not present

## 2018-11-02 DIAGNOSIS — N183 Chronic kidney disease, stage 3 (moderate): Secondary | ICD-10-CM | POA: Diagnosis not present

## 2018-11-02 LAB — URINE CULTURE: Culture: >=100000 — AB

## 2018-11-03 ENCOUNTER — Telehealth: Payer: Self-pay

## 2018-11-03 DIAGNOSIS — E1122 Type 2 diabetes mellitus with diabetic chronic kidney disease: Secondary | ICD-10-CM | POA: Diagnosis not present

## 2018-11-03 DIAGNOSIS — I5032 Chronic diastolic (congestive) heart failure: Secondary | ICD-10-CM | POA: Diagnosis not present

## 2018-11-03 DIAGNOSIS — Z87440 Personal history of urinary (tract) infections: Secondary | ICD-10-CM | POA: Diagnosis not present

## 2018-11-03 DIAGNOSIS — I482 Chronic atrial fibrillation, unspecified: Secondary | ICD-10-CM | POA: Diagnosis not present

## 2018-11-03 DIAGNOSIS — K51911 Ulcerative colitis, unspecified with rectal bleeding: Secondary | ICD-10-CM | POA: Diagnosis not present

## 2018-11-03 DIAGNOSIS — F329 Major depressive disorder, single episode, unspecified: Secondary | ICD-10-CM | POA: Diagnosis not present

## 2018-11-03 DIAGNOSIS — N183 Chronic kidney disease, stage 3 (moderate): Secondary | ICD-10-CM | POA: Diagnosis not present

## 2018-11-03 DIAGNOSIS — I130 Hypertensive heart and chronic kidney disease with heart failure and stage 1 through stage 4 chronic kidney disease, or unspecified chronic kidney disease: Secondary | ICD-10-CM | POA: Diagnosis not present

## 2018-11-03 DIAGNOSIS — Z7901 Long term (current) use of anticoagulants: Secondary | ICD-10-CM | POA: Diagnosis not present

## 2018-11-03 DIAGNOSIS — Z6841 Body Mass Index (BMI) 40.0 and over, adult: Secondary | ICD-10-CM | POA: Diagnosis not present

## 2018-11-03 DIAGNOSIS — J449 Chronic obstructive pulmonary disease, unspecified: Secondary | ICD-10-CM | POA: Diagnosis not present

## 2018-11-03 DIAGNOSIS — M810 Age-related osteoporosis without current pathological fracture: Secondary | ICD-10-CM | POA: Diagnosis not present

## 2018-11-03 LAB — CULTURE, BLOOD (ROUTINE X 2)
Culture: NO GROWTH
Culture: NO GROWTH
Special Requests: ADEQUATE
Special Requests: ADEQUATE

## 2018-11-03 NOTE — Telephone Encounter (Signed)
Post ED Visit - Positive Culture Follow-up: Successful Patient Follow-Up  Culture assessed and recommendations reviewed by:  []  Elenor Quinones, Pharm.D. []  Heide Guile, Pharm.D., BCPS AQ-ID []  Parks Neptune, Pharm.D., BCPS []  Alycia Rossetti, Pharm.D., BCPS []  Ewa Gentry, Pharm.D., BCPS, AAHIVP []  Legrand Como, Pharm.D., BCPS, AAHIVP [x]  Salome Arnt, PharmD, BCPS []  Johnnette Gourd, PharmD, BCPS []  Hughes Better, PharmD, BCPS []  [name], PharmD  Positive urine culture Still having symptoms []  Patient discharged without antimicrobial prescription and treatment is now indicated []  Organism is resistant to prescribed ED discharge antimicrobial []  Patient with positive blood cultures  Changes discussed with ED provider: Franne Forts [facility] New antibiotic prescription Fosfomyin 3 g q 48 hrs x 2 doses Called to [facility] [phone]  Contacted patient, date 11/03/2018,  time 1014   Hester Forget Tollie Pizza 11/03/2018, 10:12 AM

## 2018-11-03 NOTE — Progress Notes (Signed)
ED Antimicrobial Stewardship Positive Culture Follow Up   [name] is an 68 y.o. female who presented to [facility] on 10/29/2018 with a chief complaint of  Chief Complaint  Patient presents with  . Dysuria    Recent Results (from the past 720 hour(s))  Urine culture     Status: None   Collection Time: 10/22/18  9:10 PM  Result Value Ref Range Status   Specimen Description   Final    URINE, CATHETERIZED Performed at [facility], [address] [place], [place] [address]    Special Requests   Final    NONE Performed at [facility], [address], [place], [place] [address]    Culture   Final    Multiple bacterial morphotypes present, none predominant. Suggest appropriate recollection if clinically indicated.   Report Status 10/24/2018 FINAL  Final  Culture, Urine     Status: Abnormal   Collection Time: 10/26/18 10:22 AM  Result Value Ref Range Status   Specimen Description   Final    URINE, CLEAN CATCH Performed at [facility], [address] [place], [place] [address]    Special Requests   Final    NONE Performed at [name], [address] [place], [place] [address]    Culture (A)  Final    >=100,000 COLONIES/mL KLEBSIELLA PNEUMONIAE Confirmed Extended Spectrum Beta-Lactamase Producer (ESBL).  In bloodstream infections from ESBL organisms, carbapenems are preferred over piperacillin/tazobactam. They are shown to have a lower risk of mortality.    Report Status 10/31/2018 FINAL  Final   Organism ID, Bacteria KLEBSIELLA PNEUMONIAE (A)  Final      Susceptibility   Klebsiella pneumoniae - MIC*    AMPICILLIN >=32 RESISTANT Resistant     CEFAZOLIN >=64 RESISTANT Resistant     CEFTRIAXONE >=64 RESISTANT Resistant     CIPROFLOXACIN >=4 RESISTANT Resistant     GENTAMICIN >=16 RESISTANT Resistant     IMIPENEM <=0.25 SENSITIVE Sensitive     NITROFURANTOIN 256 RESISTANT Resistant     TRIMETH/SULFA >=320 RESISTANT Resistant     AMPICILLIN/SULBACTAM >=32 RESISTANT  Resistant     PIP/TAZO >=128 RESISTANT Resistant     Extended ESBL POSITIVE Resistant     * >=100,000 COLONIES/mL KLEBSIELLA PNEUMONIAE  Carbapenem Resistance Panel     Status: None   Collection Time: 10/26/18 12:00 PM  Result Value Ref Range Status   Carba Resistance IMP Gene NOT DETECTED NOT DETECTED Final   Carba Resistance VIM Gene NOT DETECTED NOT DETECTED Final   Carba Resistance NDM Gene NOT DETECTED NOT DETECTED Final   Carba Resistance KPC Gene NOT DETECTED NOT DETECTED Final   Carba Resistance OXA48 Gene NOT DETECTED NOT DETECTED Final    Comment: (NOTE) Cepheid Carba-R is an FDA-cleared nucleic acid amplification test  (NAAT)for the detection and differentiation of genes encoding the  most prevalent carbapenemases in bacterial isolate samples. Carbapenemase gene identification and implementation of comprehensive  infection control measures are recommended by the CDC to prevent the  spread of the resistant organisms. Performed at [facility], [place] [address], [place], [place] [address]   Culture, blood (routine x 2)     Status: None   Collection Time: 10/29/18  7:29 PM  Result Value Ref Range Status   Specimen Description BLOOD LEFT ARM  Final   Special Requests   Final    BOTTLES DRAWN AEROBIC AND ANAEROBIC Blood Culture adequate volume   Culture   Final    NO  GROWTH 5 DAYS Performed at [facility], [address] [place], [place] [address]    Report Status 11/03/2018 FINAL  Final  Culture, blood (routine x 2)     Status: None   Collection Time: 10/29/18  7:45 PM  Result Value Ref Range Status   Specimen Description BLOOD RIGHT ARM  Final   Special Requests   Final    BOTTLES DRAWN AEROBIC AND ANAEROBIC Blood Culture adequate volume   Culture   Final    NO GROWTH 5 DAYS Performed at [facility], [address] [place], [place] [address]    Report Status 11/03/2018 FINAL  Final  Urine culture     Status: Abnormal   Collection Time: 10/29/18  9:13 PM   Result Value Ref Range Status   Specimen Description   Final    URINE, CLEAN CATCH Performed at [facility], [address] [place], [place] [address]    Special Requests   Final    NONE Performed at [facility], [address], [place], [place] [address]    Culture (A)  Final    >=100,000 COLONIES/mL KLEBSIELLA PNEUMONIAE Confirmed Extended Spectrum Beta-Lactamase Producer (ESBL).  In bloodstream infections from ESBL organisms, carbapenems are preferred over piperacillin/tazobactam. They are shown to have a lower risk of mortality. MULTI-DRUG RESISTANT ORGANISM    Report Status 11/02/2018 FINAL  Final   Organism ID, Bacteria KLEBSIELLA PNEUMONIAE (A)  Final      Susceptibility   Klebsiella pneumoniae - MIC*    AMPICILLIN >=32 RESISTANT Resistant     CEFAZOLIN >=64 RESISTANT Resistant     CEFTRIAXONE >=64 RESISTANT Resistant     CIPROFLOXACIN >=4 RESISTANT Resistant     GENTAMICIN >=16 RESISTANT Resistant     IMIPENEM <=0.25 SENSITIVE Sensitive     NITROFURANTOIN 256 RESISTANT Resistant     TRIMETH/SULFA >=320 RESISTANT Resistant     AMPICILLIN/SULBACTAM >=32 RESISTANT Resistant     PIP/TAZO >=128 RESISTANT Resistant     Extended ESBL POSITIVE Resistant     * >=100,000 COLONIES/mL KLEBSIELLA PNEUMONIAE    []  Treated with N/A, organism resistant to prescribed antimicrobial []  Patient discharged originally without antimicrobial agent and treatment is now indicated  New antibiotic prescription: Will contact pt to ensure symptoms have resolved. If not, will give additional fosfomycin 3gm PO Q48H x 2 doses due to multi-drug resistant organism. If she is unable to get this or afford this, she will need to return to the ED  ED Provider: Lenn Sink, PA   [name], [name] 11/03/2018, 7:57 AM Clinical Pharmacist Monday - Friday phone -  [phone] Saturday - Sunday phone - [phone]

## 2018-11-05 DIAGNOSIS — I482 Chronic atrial fibrillation, unspecified: Secondary | ICD-10-CM | POA: Diagnosis not present

## 2018-11-05 DIAGNOSIS — I130 Hypertensive heart and chronic kidney disease with heart failure and stage 1 through stage 4 chronic kidney disease, or unspecified chronic kidney disease: Secondary | ICD-10-CM | POA: Diagnosis not present

## 2018-11-05 DIAGNOSIS — E1122 Type 2 diabetes mellitus with diabetic chronic kidney disease: Secondary | ICD-10-CM | POA: Diagnosis not present

## 2018-11-05 DIAGNOSIS — K51911 Ulcerative colitis, unspecified with rectal bleeding: Secondary | ICD-10-CM | POA: Diagnosis not present

## 2018-11-05 DIAGNOSIS — Z6841 Body Mass Index (BMI) 40.0 and over, adult: Secondary | ICD-10-CM | POA: Diagnosis not present

## 2018-11-05 DIAGNOSIS — Z7901 Long term (current) use of anticoagulants: Secondary | ICD-10-CM | POA: Diagnosis not present

## 2018-11-05 DIAGNOSIS — I5032 Chronic diastolic (congestive) heart failure: Secondary | ICD-10-CM | POA: Diagnosis not present

## 2018-11-05 DIAGNOSIS — Z87440 Personal history of urinary (tract) infections: Secondary | ICD-10-CM | POA: Diagnosis not present

## 2018-11-05 DIAGNOSIS — M810 Age-related osteoporosis without current pathological fracture: Secondary | ICD-10-CM | POA: Diagnosis not present

## 2018-11-05 DIAGNOSIS — J449 Chronic obstructive pulmonary disease, unspecified: Secondary | ICD-10-CM | POA: Diagnosis not present

## 2018-11-05 DIAGNOSIS — F329 Major depressive disorder, single episode, unspecified: Secondary | ICD-10-CM | POA: Diagnosis not present

## 2018-11-05 DIAGNOSIS — N183 Chronic kidney disease, stage 3 (moderate): Secondary | ICD-10-CM | POA: Diagnosis not present

## 2018-11-08 DIAGNOSIS — N390 Urinary tract infection, site not specified: Secondary | ICD-10-CM | POA: Diagnosis not present

## 2018-11-08 DIAGNOSIS — L02211 Cutaneous abscess of abdominal wall: Secondary | ICD-10-CM | POA: Diagnosis not present

## 2018-11-08 DIAGNOSIS — Z9181 History of falling: Secondary | ICD-10-CM | POA: Diagnosis not present

## 2018-11-08 DIAGNOSIS — S7292XA Unspecified fracture of left femur, initial encounter for closed fracture: Secondary | ICD-10-CM | POA: Diagnosis not present

## 2018-11-09 DIAGNOSIS — K51911 Ulcerative colitis, unspecified with rectal bleeding: Secondary | ICD-10-CM | POA: Diagnosis not present

## 2018-11-09 DIAGNOSIS — E1122 Type 2 diabetes mellitus with diabetic chronic kidney disease: Secondary | ICD-10-CM | POA: Diagnosis not present

## 2018-11-09 DIAGNOSIS — I5032 Chronic diastolic (congestive) heart failure: Secondary | ICD-10-CM | POA: Diagnosis not present

## 2018-11-09 DIAGNOSIS — Z7901 Long term (current) use of anticoagulants: Secondary | ICD-10-CM | POA: Diagnosis not present

## 2018-11-09 DIAGNOSIS — N183 Chronic kidney disease, stage 3 (moderate): Secondary | ICD-10-CM | POA: Diagnosis not present

## 2018-11-09 DIAGNOSIS — I482 Chronic atrial fibrillation, unspecified: Secondary | ICD-10-CM | POA: Diagnosis not present

## 2018-11-09 DIAGNOSIS — F329 Major depressive disorder, single episode, unspecified: Secondary | ICD-10-CM | POA: Diagnosis not present

## 2018-11-09 DIAGNOSIS — J449 Chronic obstructive pulmonary disease, unspecified: Secondary | ICD-10-CM | POA: Diagnosis not present

## 2018-11-09 DIAGNOSIS — Z87440 Personal history of urinary (tract) infections: Secondary | ICD-10-CM | POA: Diagnosis not present

## 2018-11-09 DIAGNOSIS — I130 Hypertensive heart and chronic kidney disease with heart failure and stage 1 through stage 4 chronic kidney disease, or unspecified chronic kidney disease: Secondary | ICD-10-CM | POA: Diagnosis not present

## 2018-11-09 DIAGNOSIS — Z6841 Body Mass Index (BMI) 40.0 and over, adult: Secondary | ICD-10-CM | POA: Diagnosis not present

## 2018-11-09 DIAGNOSIS — M810 Age-related osteoporosis without current pathological fracture: Secondary | ICD-10-CM | POA: Diagnosis not present

## 2018-11-10 DIAGNOSIS — Z6841 Body Mass Index (BMI) 40.0 and over, adult: Secondary | ICD-10-CM | POA: Diagnosis not present

## 2018-11-10 DIAGNOSIS — K51911 Ulcerative colitis, unspecified with rectal bleeding: Secondary | ICD-10-CM | POA: Diagnosis not present

## 2018-11-10 DIAGNOSIS — I5032 Chronic diastolic (congestive) heart failure: Secondary | ICD-10-CM | POA: Diagnosis not present

## 2018-11-10 DIAGNOSIS — M810 Age-related osteoporosis without current pathological fracture: Secondary | ICD-10-CM | POA: Diagnosis not present

## 2018-11-10 DIAGNOSIS — N183 Chronic kidney disease, stage 3 (moderate): Secondary | ICD-10-CM | POA: Diagnosis not present

## 2018-11-10 DIAGNOSIS — F329 Major depressive disorder, single episode, unspecified: Secondary | ICD-10-CM | POA: Diagnosis not present

## 2018-11-10 DIAGNOSIS — J449 Chronic obstructive pulmonary disease, unspecified: Secondary | ICD-10-CM | POA: Diagnosis not present

## 2018-11-10 DIAGNOSIS — I482 Chronic atrial fibrillation, unspecified: Secondary | ICD-10-CM | POA: Diagnosis not present

## 2018-11-10 DIAGNOSIS — Z87440 Personal history of urinary (tract) infections: Secondary | ICD-10-CM | POA: Diagnosis not present

## 2018-11-10 DIAGNOSIS — I130 Hypertensive heart and chronic kidney disease with heart failure and stage 1 through stage 4 chronic kidney disease, or unspecified chronic kidney disease: Secondary | ICD-10-CM | POA: Diagnosis not present

## 2018-11-10 DIAGNOSIS — E1122 Type 2 diabetes mellitus with diabetic chronic kidney disease: Secondary | ICD-10-CM | POA: Diagnosis not present

## 2018-11-10 DIAGNOSIS — Z7901 Long term (current) use of anticoagulants: Secondary | ICD-10-CM | POA: Diagnosis not present

## 2018-11-11 DIAGNOSIS — M810 Age-related osteoporosis without current pathological fracture: Secondary | ICD-10-CM | POA: Diagnosis not present

## 2018-11-11 DIAGNOSIS — E1122 Type 2 diabetes mellitus with diabetic chronic kidney disease: Secondary | ICD-10-CM | POA: Diagnosis not present

## 2018-11-11 DIAGNOSIS — I482 Chronic atrial fibrillation, unspecified: Secondary | ICD-10-CM | POA: Diagnosis not present

## 2018-11-11 DIAGNOSIS — I5032 Chronic diastolic (congestive) heart failure: Secondary | ICD-10-CM | POA: Diagnosis not present

## 2018-11-11 DIAGNOSIS — F329 Major depressive disorder, single episode, unspecified: Secondary | ICD-10-CM | POA: Diagnosis not present

## 2018-11-11 DIAGNOSIS — I130 Hypertensive heart and chronic kidney disease with heart failure and stage 1 through stage 4 chronic kidney disease, or unspecified chronic kidney disease: Secondary | ICD-10-CM | POA: Diagnosis not present

## 2018-11-11 DIAGNOSIS — J449 Chronic obstructive pulmonary disease, unspecified: Secondary | ICD-10-CM | POA: Diagnosis not present

## 2018-11-11 DIAGNOSIS — Z6841 Body Mass Index (BMI) 40.0 and over, adult: Secondary | ICD-10-CM | POA: Diagnosis not present

## 2018-11-11 DIAGNOSIS — Z7901 Long term (current) use of anticoagulants: Secondary | ICD-10-CM | POA: Diagnosis not present

## 2018-11-11 DIAGNOSIS — N183 Chronic kidney disease, stage 3 (moderate): Secondary | ICD-10-CM | POA: Diagnosis not present

## 2018-11-11 DIAGNOSIS — K51911 Ulcerative colitis, unspecified with rectal bleeding: Secondary | ICD-10-CM | POA: Diagnosis not present

## 2018-11-11 DIAGNOSIS — Z87440 Personal history of urinary (tract) infections: Secondary | ICD-10-CM | POA: Diagnosis not present

## 2018-11-12 DIAGNOSIS — N183 Chronic kidney disease, stage 3 (moderate): Secondary | ICD-10-CM | POA: Diagnosis not present

## 2018-11-12 DIAGNOSIS — F329 Major depressive disorder, single episode, unspecified: Secondary | ICD-10-CM | POA: Diagnosis not present

## 2018-11-12 DIAGNOSIS — Z87440 Personal history of urinary (tract) infections: Secondary | ICD-10-CM | POA: Diagnosis not present

## 2018-11-12 DIAGNOSIS — E1122 Type 2 diabetes mellitus with diabetic chronic kidney disease: Secondary | ICD-10-CM | POA: Diagnosis not present

## 2018-11-12 DIAGNOSIS — Z7901 Long term (current) use of anticoagulants: Secondary | ICD-10-CM | POA: Diagnosis not present

## 2018-11-12 DIAGNOSIS — K51911 Ulcerative colitis, unspecified with rectal bleeding: Secondary | ICD-10-CM | POA: Diagnosis not present

## 2018-11-12 DIAGNOSIS — M810 Age-related osteoporosis without current pathological fracture: Secondary | ICD-10-CM | POA: Diagnosis not present

## 2018-11-12 DIAGNOSIS — Z6841 Body Mass Index (BMI) 40.0 and over, adult: Secondary | ICD-10-CM | POA: Diagnosis not present

## 2018-11-12 DIAGNOSIS — J449 Chronic obstructive pulmonary disease, unspecified: Secondary | ICD-10-CM | POA: Diagnosis not present

## 2018-11-12 DIAGNOSIS — I130 Hypertensive heart and chronic kidney disease with heart failure and stage 1 through stage 4 chronic kidney disease, or unspecified chronic kidney disease: Secondary | ICD-10-CM | POA: Diagnosis not present

## 2018-11-12 DIAGNOSIS — I482 Chronic atrial fibrillation, unspecified: Secondary | ICD-10-CM | POA: Diagnosis not present

## 2018-11-12 DIAGNOSIS — I5032 Chronic diastolic (congestive) heart failure: Secondary | ICD-10-CM | POA: Diagnosis not present

## 2018-11-13 DIAGNOSIS — E1122 Type 2 diabetes mellitus with diabetic chronic kidney disease: Secondary | ICD-10-CM | POA: Diagnosis not present

## 2018-11-13 DIAGNOSIS — J449 Chronic obstructive pulmonary disease, unspecified: Secondary | ICD-10-CM | POA: Diagnosis not present

## 2018-11-13 DIAGNOSIS — Z87440 Personal history of urinary (tract) infections: Secondary | ICD-10-CM | POA: Diagnosis not present

## 2018-11-13 DIAGNOSIS — Z7901 Long term (current) use of anticoagulants: Secondary | ICD-10-CM | POA: Diagnosis not present

## 2018-11-13 DIAGNOSIS — M810 Age-related osteoporosis without current pathological fracture: Secondary | ICD-10-CM | POA: Diagnosis not present

## 2018-11-13 DIAGNOSIS — I5032 Chronic diastolic (congestive) heart failure: Secondary | ICD-10-CM | POA: Diagnosis not present

## 2018-11-13 DIAGNOSIS — Z6841 Body Mass Index (BMI) 40.0 and over, adult: Secondary | ICD-10-CM | POA: Diagnosis not present

## 2018-11-13 DIAGNOSIS — N183 Chronic kidney disease, stage 3 (moderate): Secondary | ICD-10-CM | POA: Diagnosis not present

## 2018-11-13 DIAGNOSIS — I130 Hypertensive heart and chronic kidney disease with heart failure and stage 1 through stage 4 chronic kidney disease, or unspecified chronic kidney disease: Secondary | ICD-10-CM | POA: Diagnosis not present

## 2018-11-13 DIAGNOSIS — K51911 Ulcerative colitis, unspecified with rectal bleeding: Secondary | ICD-10-CM | POA: Diagnosis not present

## 2018-11-13 DIAGNOSIS — I482 Chronic atrial fibrillation, unspecified: Secondary | ICD-10-CM | POA: Diagnosis not present

## 2018-11-13 DIAGNOSIS — F329 Major depressive disorder, single episode, unspecified: Secondary | ICD-10-CM | POA: Diagnosis not present

## 2018-11-14 DIAGNOSIS — M810 Age-related osteoporosis without current pathological fracture: Secondary | ICD-10-CM | POA: Diagnosis not present

## 2018-11-14 DIAGNOSIS — F329 Major depressive disorder, single episode, unspecified: Secondary | ICD-10-CM | POA: Diagnosis not present

## 2018-11-14 DIAGNOSIS — J449 Chronic obstructive pulmonary disease, unspecified: Secondary | ICD-10-CM | POA: Diagnosis not present

## 2018-11-14 DIAGNOSIS — K51911 Ulcerative colitis, unspecified with rectal bleeding: Secondary | ICD-10-CM | POA: Diagnosis not present

## 2018-11-14 DIAGNOSIS — I482 Chronic atrial fibrillation, unspecified: Secondary | ICD-10-CM | POA: Diagnosis not present

## 2018-11-14 DIAGNOSIS — I130 Hypertensive heart and chronic kidney disease with heart failure and stage 1 through stage 4 chronic kidney disease, or unspecified chronic kidney disease: Secondary | ICD-10-CM | POA: Diagnosis not present

## 2018-11-14 DIAGNOSIS — Z87440 Personal history of urinary (tract) infections: Secondary | ICD-10-CM | POA: Diagnosis not present

## 2018-11-14 DIAGNOSIS — Z7901 Long term (current) use of anticoagulants: Secondary | ICD-10-CM | POA: Diagnosis not present

## 2018-11-14 DIAGNOSIS — I5032 Chronic diastolic (congestive) heart failure: Secondary | ICD-10-CM | POA: Diagnosis not present

## 2018-11-14 DIAGNOSIS — N183 Chronic kidney disease, stage 3 (moderate): Secondary | ICD-10-CM | POA: Diagnosis not present

## 2018-11-14 DIAGNOSIS — Z6841 Body Mass Index (BMI) 40.0 and over, adult: Secondary | ICD-10-CM | POA: Diagnosis not present

## 2018-11-14 DIAGNOSIS — E1122 Type 2 diabetes mellitus with diabetic chronic kidney disease: Secondary | ICD-10-CM | POA: Diagnosis not present

## 2018-11-15 DIAGNOSIS — K51911 Ulcerative colitis, unspecified with rectal bleeding: Secondary | ICD-10-CM | POA: Diagnosis not present

## 2018-11-15 DIAGNOSIS — M810 Age-related osteoporosis without current pathological fracture: Secondary | ICD-10-CM | POA: Diagnosis not present

## 2018-11-15 DIAGNOSIS — N183 Chronic kidney disease, stage 3 (moderate): Secondary | ICD-10-CM | POA: Diagnosis not present

## 2018-11-15 DIAGNOSIS — J449 Chronic obstructive pulmonary disease, unspecified: Secondary | ICD-10-CM | POA: Diagnosis not present

## 2018-11-15 DIAGNOSIS — I482 Chronic atrial fibrillation, unspecified: Secondary | ICD-10-CM | POA: Diagnosis not present

## 2018-11-15 DIAGNOSIS — I130 Hypertensive heart and chronic kidney disease with heart failure and stage 1 through stage 4 chronic kidney disease, or unspecified chronic kidney disease: Secondary | ICD-10-CM | POA: Diagnosis not present

## 2018-11-15 DIAGNOSIS — Z7901 Long term (current) use of anticoagulants: Secondary | ICD-10-CM | POA: Diagnosis not present

## 2018-11-15 DIAGNOSIS — Z6841 Body Mass Index (BMI) 40.0 and over, adult: Secondary | ICD-10-CM | POA: Diagnosis not present

## 2018-11-15 DIAGNOSIS — I5032 Chronic diastolic (congestive) heart failure: Secondary | ICD-10-CM | POA: Diagnosis not present

## 2018-11-15 DIAGNOSIS — E1122 Type 2 diabetes mellitus with diabetic chronic kidney disease: Secondary | ICD-10-CM | POA: Diagnosis not present

## 2018-11-15 DIAGNOSIS — F329 Major depressive disorder, single episode, unspecified: Secondary | ICD-10-CM | POA: Diagnosis not present

## 2018-11-15 DIAGNOSIS — Z87440 Personal history of urinary (tract) infections: Secondary | ICD-10-CM | POA: Diagnosis not present

## 2018-11-16 DIAGNOSIS — I5032 Chronic diastolic (congestive) heart failure: Secondary | ICD-10-CM | POA: Diagnosis not present

## 2018-11-16 DIAGNOSIS — N183 Chronic kidney disease, stage 3 (moderate): Secondary | ICD-10-CM | POA: Diagnosis not present

## 2018-11-16 DIAGNOSIS — Z6841 Body Mass Index (BMI) 40.0 and over, adult: Secondary | ICD-10-CM | POA: Diagnosis not present

## 2018-11-16 DIAGNOSIS — F329 Major depressive disorder, single episode, unspecified: Secondary | ICD-10-CM | POA: Diagnosis not present

## 2018-11-16 DIAGNOSIS — I130 Hypertensive heart and chronic kidney disease with heart failure and stage 1 through stage 4 chronic kidney disease, or unspecified chronic kidney disease: Secondary | ICD-10-CM | POA: Diagnosis not present

## 2018-11-16 DIAGNOSIS — J449 Chronic obstructive pulmonary disease, unspecified: Secondary | ICD-10-CM | POA: Diagnosis not present

## 2018-11-16 DIAGNOSIS — K51911 Ulcerative colitis, unspecified with rectal bleeding: Secondary | ICD-10-CM | POA: Diagnosis not present

## 2018-11-16 DIAGNOSIS — M810 Age-related osteoporosis without current pathological fracture: Secondary | ICD-10-CM | POA: Diagnosis not present

## 2018-11-16 DIAGNOSIS — Z87440 Personal history of urinary (tract) infections: Secondary | ICD-10-CM | POA: Diagnosis not present

## 2018-11-16 DIAGNOSIS — Z7901 Long term (current) use of anticoagulants: Secondary | ICD-10-CM | POA: Diagnosis not present

## 2018-11-16 DIAGNOSIS — E1122 Type 2 diabetes mellitus with diabetic chronic kidney disease: Secondary | ICD-10-CM | POA: Diagnosis not present

## 2018-11-16 DIAGNOSIS — I482 Chronic atrial fibrillation, unspecified: Secondary | ICD-10-CM | POA: Diagnosis not present

## 2018-11-18 DIAGNOSIS — I5032 Chronic diastolic (congestive) heart failure: Secondary | ICD-10-CM | POA: Diagnosis not present

## 2018-11-18 DIAGNOSIS — Z87440 Personal history of urinary (tract) infections: Secondary | ICD-10-CM | POA: Diagnosis not present

## 2018-11-18 DIAGNOSIS — K51911 Ulcerative colitis, unspecified with rectal bleeding: Secondary | ICD-10-CM | POA: Diagnosis not present

## 2018-11-18 DIAGNOSIS — N183 Chronic kidney disease, stage 3 (moderate): Secondary | ICD-10-CM | POA: Diagnosis not present

## 2018-11-18 DIAGNOSIS — I130 Hypertensive heart and chronic kidney disease with heart failure and stage 1 through stage 4 chronic kidney disease, or unspecified chronic kidney disease: Secondary | ICD-10-CM | POA: Diagnosis not present

## 2018-11-18 DIAGNOSIS — E1122 Type 2 diabetes mellitus with diabetic chronic kidney disease: Secondary | ICD-10-CM | POA: Diagnosis not present

## 2018-11-18 DIAGNOSIS — Z7901 Long term (current) use of anticoagulants: Secondary | ICD-10-CM | POA: Diagnosis not present

## 2018-11-18 DIAGNOSIS — J449 Chronic obstructive pulmonary disease, unspecified: Secondary | ICD-10-CM | POA: Diagnosis not present

## 2018-11-18 DIAGNOSIS — R404 Transient alteration of awareness: Secondary | ICD-10-CM | POA: Diagnosis not present

## 2018-11-18 DIAGNOSIS — M810 Age-related osteoporosis without current pathological fracture: Secondary | ICD-10-CM | POA: Diagnosis not present

## 2018-11-18 DIAGNOSIS — F329 Major depressive disorder, single episode, unspecified: Secondary | ICD-10-CM | POA: Diagnosis not present

## 2018-11-18 DIAGNOSIS — N390 Urinary tract infection, site not specified: Secondary | ICD-10-CM | POA: Diagnosis not present

## 2018-11-18 DIAGNOSIS — I482 Chronic atrial fibrillation, unspecified: Secondary | ICD-10-CM | POA: Diagnosis not present

## 2018-11-18 DIAGNOSIS — Z6841 Body Mass Index (BMI) 40.0 and over, adult: Secondary | ICD-10-CM | POA: Diagnosis not present

## 2018-11-18 DIAGNOSIS — E1121 Type 2 diabetes mellitus with diabetic nephropathy: Secondary | ICD-10-CM | POA: Diagnosis not present

## 2018-11-20 DIAGNOSIS — I130 Hypertensive heart and chronic kidney disease with heart failure and stage 1 through stage 4 chronic kidney disease, or unspecified chronic kidney disease: Secondary | ICD-10-CM | POA: Diagnosis not present

## 2018-11-20 DIAGNOSIS — F329 Major depressive disorder, single episode, unspecified: Secondary | ICD-10-CM | POA: Diagnosis not present

## 2018-11-20 DIAGNOSIS — J449 Chronic obstructive pulmonary disease, unspecified: Secondary | ICD-10-CM | POA: Diagnosis not present

## 2018-11-20 DIAGNOSIS — M810 Age-related osteoporosis without current pathological fracture: Secondary | ICD-10-CM | POA: Diagnosis not present

## 2018-11-20 DIAGNOSIS — K51911 Ulcerative colitis, unspecified with rectal bleeding: Secondary | ICD-10-CM | POA: Diagnosis not present

## 2018-11-20 DIAGNOSIS — Z87440 Personal history of urinary (tract) infections: Secondary | ICD-10-CM | POA: Diagnosis not present

## 2018-11-20 DIAGNOSIS — E1122 Type 2 diabetes mellitus with diabetic chronic kidney disease: Secondary | ICD-10-CM | POA: Diagnosis not present

## 2018-11-20 DIAGNOSIS — N183 Chronic kidney disease, stage 3 (moderate): Secondary | ICD-10-CM | POA: Diagnosis not present

## 2018-11-20 DIAGNOSIS — Z6841 Body Mass Index (BMI) 40.0 and over, adult: Secondary | ICD-10-CM | POA: Diagnosis not present

## 2018-11-20 DIAGNOSIS — I5032 Chronic diastolic (congestive) heart failure: Secondary | ICD-10-CM | POA: Diagnosis not present

## 2018-11-20 DIAGNOSIS — Z7901 Long term (current) use of anticoagulants: Secondary | ICD-10-CM | POA: Diagnosis not present

## 2018-11-20 DIAGNOSIS — I482 Chronic atrial fibrillation, unspecified: Secondary | ICD-10-CM | POA: Diagnosis not present

## 2018-11-21 DIAGNOSIS — E1122 Type 2 diabetes mellitus with diabetic chronic kidney disease: Secondary | ICD-10-CM | POA: Diagnosis not present

## 2018-11-21 DIAGNOSIS — Z6841 Body Mass Index (BMI) 40.0 and over, adult: Secondary | ICD-10-CM | POA: Diagnosis not present

## 2018-11-21 DIAGNOSIS — I5032 Chronic diastolic (congestive) heart failure: Secondary | ICD-10-CM | POA: Diagnosis not present

## 2018-11-21 DIAGNOSIS — Z87440 Personal history of urinary (tract) infections: Secondary | ICD-10-CM | POA: Diagnosis not present

## 2018-11-21 DIAGNOSIS — M810 Age-related osteoporosis without current pathological fracture: Secondary | ICD-10-CM | POA: Diagnosis not present

## 2018-11-21 DIAGNOSIS — F329 Major depressive disorder, single episode, unspecified: Secondary | ICD-10-CM | POA: Diagnosis not present

## 2018-11-21 DIAGNOSIS — N183 Chronic kidney disease, stage 3 (moderate): Secondary | ICD-10-CM | POA: Diagnosis not present

## 2018-11-21 DIAGNOSIS — Z7901 Long term (current) use of anticoagulants: Secondary | ICD-10-CM | POA: Diagnosis not present

## 2018-11-21 DIAGNOSIS — I482 Chronic atrial fibrillation, unspecified: Secondary | ICD-10-CM | POA: Diagnosis not present

## 2018-11-21 DIAGNOSIS — J449 Chronic obstructive pulmonary disease, unspecified: Secondary | ICD-10-CM | POA: Diagnosis not present

## 2018-11-21 DIAGNOSIS — K51911 Ulcerative colitis, unspecified with rectal bleeding: Secondary | ICD-10-CM | POA: Diagnosis not present

## 2018-11-21 DIAGNOSIS — I130 Hypertensive heart and chronic kidney disease with heart failure and stage 1 through stage 4 chronic kidney disease, or unspecified chronic kidney disease: Secondary | ICD-10-CM | POA: Diagnosis not present

## 2018-11-22 DIAGNOSIS — I130 Hypertensive heart and chronic kidney disease with heart failure and stage 1 through stage 4 chronic kidney disease, or unspecified chronic kidney disease: Secondary | ICD-10-CM | POA: Diagnosis not present

## 2018-11-22 DIAGNOSIS — I482 Chronic atrial fibrillation, unspecified: Secondary | ICD-10-CM | POA: Diagnosis not present

## 2018-11-22 DIAGNOSIS — J449 Chronic obstructive pulmonary disease, unspecified: Secondary | ICD-10-CM | POA: Diagnosis not present

## 2018-11-22 DIAGNOSIS — M810 Age-related osteoporosis without current pathological fracture: Secondary | ICD-10-CM | POA: Diagnosis not present

## 2018-11-22 DIAGNOSIS — Z7901 Long term (current) use of anticoagulants: Secondary | ICD-10-CM | POA: Diagnosis not present

## 2018-11-22 DIAGNOSIS — F329 Major depressive disorder, single episode, unspecified: Secondary | ICD-10-CM | POA: Diagnosis not present

## 2018-11-22 DIAGNOSIS — Z6841 Body Mass Index (BMI) 40.0 and over, adult: Secondary | ICD-10-CM | POA: Diagnosis not present

## 2018-11-22 DIAGNOSIS — E1122 Type 2 diabetes mellitus with diabetic chronic kidney disease: Secondary | ICD-10-CM | POA: Diagnosis not present

## 2018-11-22 DIAGNOSIS — N3090 Cystitis, unspecified without hematuria: Secondary | ICD-10-CM | POA: Diagnosis not present

## 2018-11-22 DIAGNOSIS — K51911 Ulcerative colitis, unspecified with rectal bleeding: Secondary | ICD-10-CM | POA: Diagnosis not present

## 2018-11-22 DIAGNOSIS — N183 Chronic kidney disease, stage 3 (moderate): Secondary | ICD-10-CM | POA: Diagnosis not present

## 2018-11-22 DIAGNOSIS — Z87440 Personal history of urinary (tract) infections: Secondary | ICD-10-CM | POA: Diagnosis not present

## 2018-11-22 DIAGNOSIS — I5032 Chronic diastolic (congestive) heart failure: Secondary | ICD-10-CM | POA: Diagnosis not present

## 2018-11-23 DIAGNOSIS — M810 Age-related osteoporosis without current pathological fracture: Secondary | ICD-10-CM | POA: Diagnosis not present

## 2018-11-23 DIAGNOSIS — J449 Chronic obstructive pulmonary disease, unspecified: Secondary | ICD-10-CM | POA: Diagnosis not present

## 2018-11-23 DIAGNOSIS — Z7901 Long term (current) use of anticoagulants: Secondary | ICD-10-CM | POA: Diagnosis not present

## 2018-11-23 DIAGNOSIS — I130 Hypertensive heart and chronic kidney disease with heart failure and stage 1 through stage 4 chronic kidney disease, or unspecified chronic kidney disease: Secondary | ICD-10-CM | POA: Diagnosis not present

## 2018-11-23 DIAGNOSIS — I482 Chronic atrial fibrillation, unspecified: Secondary | ICD-10-CM | POA: Diagnosis not present

## 2018-11-23 DIAGNOSIS — F329 Major depressive disorder, single episode, unspecified: Secondary | ICD-10-CM | POA: Diagnosis not present

## 2018-11-23 DIAGNOSIS — I5032 Chronic diastolic (congestive) heart failure: Secondary | ICD-10-CM | POA: Diagnosis not present

## 2018-11-23 DIAGNOSIS — K51911 Ulcerative colitis, unspecified with rectal bleeding: Secondary | ICD-10-CM | POA: Diagnosis not present

## 2018-11-23 DIAGNOSIS — E1122 Type 2 diabetes mellitus with diabetic chronic kidney disease: Secondary | ICD-10-CM | POA: Diagnosis not present

## 2018-11-23 DIAGNOSIS — Z87440 Personal history of urinary (tract) infections: Secondary | ICD-10-CM | POA: Diagnosis not present

## 2018-11-23 DIAGNOSIS — N183 Chronic kidney disease, stage 3 (moderate): Secondary | ICD-10-CM | POA: Diagnosis not present

## 2018-11-23 DIAGNOSIS — Z6841 Body Mass Index (BMI) 40.0 and over, adult: Secondary | ICD-10-CM | POA: Diagnosis not present

## 2018-11-24 DIAGNOSIS — K51911 Ulcerative colitis, unspecified with rectal bleeding: Secondary | ICD-10-CM | POA: Diagnosis not present

## 2018-11-24 DIAGNOSIS — I5032 Chronic diastolic (congestive) heart failure: Secondary | ICD-10-CM | POA: Diagnosis not present

## 2018-11-24 DIAGNOSIS — F329 Major depressive disorder, single episode, unspecified: Secondary | ICD-10-CM | POA: Diagnosis not present

## 2018-11-24 DIAGNOSIS — I130 Hypertensive heart and chronic kidney disease with heart failure and stage 1 through stage 4 chronic kidney disease, or unspecified chronic kidney disease: Secondary | ICD-10-CM | POA: Diagnosis not present

## 2018-11-24 DIAGNOSIS — E1122 Type 2 diabetes mellitus with diabetic chronic kidney disease: Secondary | ICD-10-CM | POA: Diagnosis not present

## 2018-11-24 DIAGNOSIS — M810 Age-related osteoporosis without current pathological fracture: Secondary | ICD-10-CM | POA: Diagnosis not present

## 2018-11-24 DIAGNOSIS — Z6841 Body Mass Index (BMI) 40.0 and over, adult: Secondary | ICD-10-CM | POA: Diagnosis not present

## 2018-11-24 DIAGNOSIS — Z87440 Personal history of urinary (tract) infections: Secondary | ICD-10-CM | POA: Diagnosis not present

## 2018-11-24 DIAGNOSIS — J449 Chronic obstructive pulmonary disease, unspecified: Secondary | ICD-10-CM | POA: Diagnosis not present

## 2018-11-24 DIAGNOSIS — N183 Chronic kidney disease, stage 3 (moderate): Secondary | ICD-10-CM | POA: Diagnosis not present

## 2018-11-24 DIAGNOSIS — Z7901 Long term (current) use of anticoagulants: Secondary | ICD-10-CM | POA: Diagnosis not present

## 2018-11-24 DIAGNOSIS — I482 Chronic atrial fibrillation, unspecified: Secondary | ICD-10-CM | POA: Diagnosis not present

## 2018-11-25 ENCOUNTER — Other Ambulatory Visit (HOSPITAL_COMMUNITY)
Admission: RE | Admit: 2018-11-25 | Discharge: 2018-11-25 | Disposition: A | Discharge status: Home / Self Care | Payer: BLUE CROSS/BLUE SHIELD | Source: Skilled Nursing Facility | Attending: Internal Medicine | Admitting: Internal Medicine

## 2018-11-25 DIAGNOSIS — E1122 Type 2 diabetes mellitus with diabetic chronic kidney disease: Secondary | ICD-10-CM | POA: Diagnosis not present

## 2018-11-25 DIAGNOSIS — M810 Age-related osteoporosis without current pathological fracture: Secondary | ICD-10-CM | POA: Diagnosis not present

## 2018-11-25 DIAGNOSIS — K51911 Ulcerative colitis, unspecified with rectal bleeding: Secondary | ICD-10-CM | POA: Insufficient documentation

## 2018-11-25 DIAGNOSIS — J449 Chronic obstructive pulmonary disease, unspecified: Secondary | ICD-10-CM | POA: Diagnosis not present

## 2018-11-25 DIAGNOSIS — I130 Hypertensive heart and chronic kidney disease with heart failure and stage 1 through stage 4 chronic kidney disease, or unspecified chronic kidney disease: Secondary | ICD-10-CM | POA: Diagnosis not present

## 2018-11-25 DIAGNOSIS — F329 Major depressive disorder, single episode, unspecified: Secondary | ICD-10-CM | POA: Diagnosis not present

## 2018-11-25 DIAGNOSIS — I482 Chronic atrial fibrillation, unspecified: Secondary | ICD-10-CM | POA: Diagnosis not present

## 2018-11-25 DIAGNOSIS — N183 Chronic kidney disease, stage 3 (moderate): Secondary | ICD-10-CM | POA: Diagnosis not present

## 2018-11-25 DIAGNOSIS — I5032 Chronic diastolic (congestive) heart failure: Secondary | ICD-10-CM | POA: Diagnosis not present

## 2018-11-25 DIAGNOSIS — Z6841 Body Mass Index (BMI) 40.0 and over, adult: Secondary | ICD-10-CM | POA: Diagnosis not present

## 2018-11-25 DIAGNOSIS — Z7901 Long term (current) use of anticoagulants: Secondary | ICD-10-CM | POA: Diagnosis not present

## 2018-11-25 DIAGNOSIS — Z87440 Personal history of urinary (tract) infections: Secondary | ICD-10-CM | POA: Diagnosis not present

## 2018-11-25 LAB — FERRITIN: Ferritin: 28 ng/mL (ref 11–307)

## 2018-11-26 DIAGNOSIS — N183 Chronic kidney disease, stage 3 (moderate): Secondary | ICD-10-CM | POA: Diagnosis not present

## 2018-11-26 DIAGNOSIS — I130 Hypertensive heart and chronic kidney disease with heart failure and stage 1 through stage 4 chronic kidney disease, or unspecified chronic kidney disease: Secondary | ICD-10-CM | POA: Diagnosis not present

## 2018-11-26 DIAGNOSIS — Z6841 Body Mass Index (BMI) 40.0 and over, adult: Secondary | ICD-10-CM | POA: Diagnosis not present

## 2018-11-26 DIAGNOSIS — I5032 Chronic diastolic (congestive) heart failure: Secondary | ICD-10-CM | POA: Diagnosis not present

## 2018-11-26 DIAGNOSIS — E1122 Type 2 diabetes mellitus with diabetic chronic kidney disease: Secondary | ICD-10-CM | POA: Diagnosis not present

## 2018-11-26 DIAGNOSIS — M810 Age-related osteoporosis without current pathological fracture: Secondary | ICD-10-CM | POA: Diagnosis not present

## 2018-11-26 DIAGNOSIS — K51911 Ulcerative colitis, unspecified with rectal bleeding: Secondary | ICD-10-CM | POA: Diagnosis not present

## 2018-11-26 DIAGNOSIS — I482 Chronic atrial fibrillation, unspecified: Secondary | ICD-10-CM | POA: Diagnosis not present

## 2018-11-26 DIAGNOSIS — Z87440 Personal history of urinary (tract) infections: Secondary | ICD-10-CM | POA: Diagnosis not present

## 2018-11-26 DIAGNOSIS — F329 Major depressive disorder, single episode, unspecified: Secondary | ICD-10-CM | POA: Diagnosis not present

## 2018-11-26 DIAGNOSIS — J449 Chronic obstructive pulmonary disease, unspecified: Secondary | ICD-10-CM | POA: Diagnosis not present

## 2018-11-26 DIAGNOSIS — Z7901 Long term (current) use of anticoagulants: Secondary | ICD-10-CM | POA: Diagnosis not present

## 2018-11-27 ENCOUNTER — Other Ambulatory Visit (HOSPITAL_COMMUNITY)
Admission: RE | Admit: 2018-11-27 | Discharge: 2018-11-27 | Disposition: A | Discharge status: Home / Self Care | Payer: BLUE CROSS/BLUE SHIELD | Source: Other Acute Inpatient Hospital | Attending: Internal Medicine | Admitting: Internal Medicine

## 2018-11-27 DIAGNOSIS — I5032 Chronic diastolic (congestive) heart failure: Secondary | ICD-10-CM | POA: Insufficient documentation

## 2018-11-27 DIAGNOSIS — E1122 Type 2 diabetes mellitus with diabetic chronic kidney disease: Secondary | ICD-10-CM | POA: Insufficient documentation

## 2018-11-27 DIAGNOSIS — K51911 Ulcerative colitis, unspecified with rectal bleeding: Secondary | ICD-10-CM | POA: Diagnosis not present

## 2018-11-27 DIAGNOSIS — J449 Chronic obstructive pulmonary disease, unspecified: Secondary | ICD-10-CM | POA: Diagnosis not present

## 2018-11-27 DIAGNOSIS — Z87440 Personal history of urinary (tract) infections: Secondary | ICD-10-CM | POA: Diagnosis not present

## 2018-11-27 DIAGNOSIS — I482 Chronic atrial fibrillation, unspecified: Secondary | ICD-10-CM | POA: Diagnosis not present

## 2018-11-27 DIAGNOSIS — N183 Chronic kidney disease, stage 3 (moderate): Secondary | ICD-10-CM | POA: Diagnosis not present

## 2018-11-27 DIAGNOSIS — Z7901 Long term (current) use of anticoagulants: Secondary | ICD-10-CM | POA: Diagnosis not present

## 2018-11-27 DIAGNOSIS — Z6841 Body Mass Index (BMI) 40.0 and over, adult: Secondary | ICD-10-CM | POA: Diagnosis not present

## 2018-11-27 DIAGNOSIS — M810 Age-related osteoporosis without current pathological fracture: Secondary | ICD-10-CM | POA: Diagnosis not present

## 2018-11-27 DIAGNOSIS — F329 Major depressive disorder, single episode, unspecified: Secondary | ICD-10-CM | POA: Diagnosis not present

## 2018-11-27 DIAGNOSIS — I130 Hypertensive heart and chronic kidney disease with heart failure and stage 1 through stage 4 chronic kidney disease, or unspecified chronic kidney disease: Secondary | ICD-10-CM | POA: Diagnosis not present

## 2018-11-27 LAB — URINALYSIS, ROUTINE W REFLEX MICROSCOPIC
Bacteria, UA: NONE SEEN
Bilirubin Urine: NEGATIVE
Glucose, UA: 50 mg/dL — AB
Hgb urine dipstick: NEGATIVE
Ketones, ur: NEGATIVE mg/dL
Nitrite: NEGATIVE
Protein, ur: 30 mg/dL — AB
Specific Gravity, Urine: 1.019 (ref 1.005–1.030)
pH: 5 (ref 5.0–8.0)

## 2018-11-27 LAB — HAPTOGLOBIN: Haptoglobin: 279 mg/dL (ref 37–355)

## 2018-11-29 DIAGNOSIS — I130 Hypertensive heart and chronic kidney disease with heart failure and stage 1 through stage 4 chronic kidney disease, or unspecified chronic kidney disease: Secondary | ICD-10-CM | POA: Diagnosis not present

## 2018-11-29 DIAGNOSIS — I482 Chronic atrial fibrillation, unspecified: Secondary | ICD-10-CM | POA: Diagnosis not present

## 2018-11-29 DIAGNOSIS — Z7901 Long term (current) use of anticoagulants: Secondary | ICD-10-CM | POA: Diagnosis not present

## 2018-11-29 DIAGNOSIS — J449 Chronic obstructive pulmonary disease, unspecified: Secondary | ICD-10-CM | POA: Diagnosis not present

## 2018-11-29 DIAGNOSIS — K51911 Ulcerative colitis, unspecified with rectal bleeding: Secondary | ICD-10-CM | POA: Diagnosis not present

## 2018-11-29 DIAGNOSIS — E1122 Type 2 diabetes mellitus with diabetic chronic kidney disease: Secondary | ICD-10-CM | POA: Diagnosis not present

## 2018-11-29 DIAGNOSIS — I5032 Chronic diastolic (congestive) heart failure: Secondary | ICD-10-CM | POA: Diagnosis not present

## 2018-11-29 DIAGNOSIS — F329 Major depressive disorder, single episode, unspecified: Secondary | ICD-10-CM | POA: Diagnosis not present

## 2018-11-29 DIAGNOSIS — M810 Age-related osteoporosis without current pathological fracture: Secondary | ICD-10-CM | POA: Diagnosis not present

## 2018-11-29 DIAGNOSIS — Z6841 Body Mass Index (BMI) 40.0 and over, adult: Secondary | ICD-10-CM | POA: Diagnosis not present

## 2018-11-29 DIAGNOSIS — Z87440 Personal history of urinary (tract) infections: Secondary | ICD-10-CM | POA: Diagnosis not present

## 2018-11-29 DIAGNOSIS — N183 Chronic kidney disease, stage 3 (moderate): Secondary | ICD-10-CM | POA: Diagnosis not present

## 2018-12-01 DIAGNOSIS — K51911 Ulcerative colitis, unspecified with rectal bleeding: Secondary | ICD-10-CM | POA: Diagnosis not present

## 2018-12-01 DIAGNOSIS — N183 Chronic kidney disease, stage 3 (moderate): Secondary | ICD-10-CM | POA: Diagnosis not present

## 2018-12-01 DIAGNOSIS — Z87440 Personal history of urinary (tract) infections: Secondary | ICD-10-CM | POA: Diagnosis not present

## 2018-12-01 DIAGNOSIS — M810 Age-related osteoporosis without current pathological fracture: Secondary | ICD-10-CM | POA: Diagnosis not present

## 2018-12-01 DIAGNOSIS — F329 Major depressive disorder, single episode, unspecified: Secondary | ICD-10-CM | POA: Diagnosis not present

## 2018-12-01 DIAGNOSIS — I482 Chronic atrial fibrillation, unspecified: Secondary | ICD-10-CM | POA: Diagnosis not present

## 2018-12-01 DIAGNOSIS — I5032 Chronic diastolic (congestive) heart failure: Secondary | ICD-10-CM | POA: Diagnosis not present

## 2018-12-01 DIAGNOSIS — E1122 Type 2 diabetes mellitus with diabetic chronic kidney disease: Secondary | ICD-10-CM | POA: Diagnosis not present

## 2018-12-01 DIAGNOSIS — J449 Chronic obstructive pulmonary disease, unspecified: Secondary | ICD-10-CM | POA: Diagnosis not present

## 2018-12-01 DIAGNOSIS — I130 Hypertensive heart and chronic kidney disease with heart failure and stage 1 through stage 4 chronic kidney disease, or unspecified chronic kidney disease: Secondary | ICD-10-CM | POA: Diagnosis not present

## 2018-12-01 DIAGNOSIS — Z7901 Long term (current) use of anticoagulants: Secondary | ICD-10-CM | POA: Diagnosis not present

## 2018-12-01 DIAGNOSIS — Z6841 Body Mass Index (BMI) 40.0 and over, adult: Secondary | ICD-10-CM | POA: Diagnosis not present

## 2018-12-05 ENCOUNTER — Emergency Department (HOSPITAL_COMMUNITY): Payer: BLUE CROSS/BLUE SHIELD

## 2018-12-05 ENCOUNTER — Encounter (HOSPITAL_COMMUNITY): Payer: Self-pay | Admitting: Emergency Medicine

## 2018-12-05 ENCOUNTER — Other Ambulatory Visit: Payer: Self-pay

## 2018-12-05 ENCOUNTER — Observation Stay (HOSPITAL_COMMUNITY)
Admission: EM | Admit: 2018-12-05 | Discharge: 2018-12-07 | DRG: 603 | Disposition: A | Discharge status: Home Health | Payer: BLUE CROSS/BLUE SHIELD | Attending: Internal Medicine | Admitting: Internal Medicine

## 2018-12-05 DIAGNOSIS — Z91040 Latex allergy status: Secondary | ICD-10-CM

## 2018-12-05 DIAGNOSIS — I482 Chronic atrial fibrillation, unspecified: Secondary | ICD-10-CM | POA: Diagnosis present

## 2018-12-05 DIAGNOSIS — J449 Chronic obstructive pulmonary disease, unspecified: Secondary | ICD-10-CM | POA: Diagnosis present

## 2018-12-05 DIAGNOSIS — Z7901 Long term (current) use of anticoagulants: Secondary | ICD-10-CM

## 2018-12-05 DIAGNOSIS — D638 Anemia in other chronic diseases classified elsewhere: Secondary | ICD-10-CM | POA: Diagnosis present

## 2018-12-05 DIAGNOSIS — Z8619 Personal history of other infectious and parasitic diseases: Secondary | ICD-10-CM

## 2018-12-05 DIAGNOSIS — F329 Major depressive disorder, single episode, unspecified: Secondary | ICD-10-CM | POA: Diagnosis present

## 2018-12-05 DIAGNOSIS — I5032 Chronic diastolic (congestive) heart failure: Secondary | ICD-10-CM | POA: Diagnosis not present

## 2018-12-05 DIAGNOSIS — I4891 Unspecified atrial fibrillation: Secondary | ICD-10-CM

## 2018-12-05 DIAGNOSIS — Z7984 Long term (current) use of oral hypoglycemic drugs: Secondary | ICD-10-CM

## 2018-12-05 DIAGNOSIS — R54 Age-related physical debility: Secondary | ICD-10-CM | POA: Diagnosis present

## 2018-12-05 DIAGNOSIS — Z6837 Body mass index (BMI) 37.0-37.9, adult: Secondary | ICD-10-CM | POA: Diagnosis not present

## 2018-12-05 DIAGNOSIS — L98498 Non-pressure chronic ulcer of skin of other sites with other specified severity: Secondary | ICD-10-CM | POA: Diagnosis not present

## 2018-12-05 DIAGNOSIS — I10 Essential (primary) hypertension: Secondary | ICD-10-CM | POA: Diagnosis present

## 2018-12-05 DIAGNOSIS — R109 Unspecified abdominal pain: Secondary | ICD-10-CM | POA: Diagnosis not present

## 2018-12-05 DIAGNOSIS — E119 Type 2 diabetes mellitus without complications: Secondary | ICD-10-CM | POA: Diagnosis not present

## 2018-12-05 DIAGNOSIS — Z7951 Long term (current) use of inhaled steroids: Secondary | ICD-10-CM

## 2018-12-05 DIAGNOSIS — S31109A Unspecified open wound of abdominal wall, unspecified quadrant without penetration into peritoneal cavity, initial encounter: Secondary | ICD-10-CM | POA: Diagnosis not present

## 2018-12-05 DIAGNOSIS — Z9049 Acquired absence of other specified parts of digestive tract: Secondary | ICD-10-CM | POA: Diagnosis not present

## 2018-12-05 DIAGNOSIS — L02211 Cutaneous abscess of abdominal wall: Secondary | ICD-10-CM | POA: Diagnosis not present

## 2018-12-05 DIAGNOSIS — I110 Hypertensive heart disease with heart failure: Secondary | ICD-10-CM | POA: Diagnosis present

## 2018-12-05 DIAGNOSIS — Z7401 Bed confinement status: Secondary | ICD-10-CM | POA: Diagnosis not present

## 2018-12-05 DIAGNOSIS — Z833 Family history of diabetes mellitus: Secondary | ICD-10-CM

## 2018-12-05 DIAGNOSIS — E1165 Type 2 diabetes mellitus with hyperglycemia: Secondary | ICD-10-CM | POA: Diagnosis not present

## 2018-12-05 DIAGNOSIS — Z87440 Personal history of urinary (tract) infections: Secondary | ICD-10-CM | POA: Diagnosis not present

## 2018-12-05 DIAGNOSIS — Z888 Allergy status to other drugs, medicaments and biological substances status: Secondary | ICD-10-CM | POA: Diagnosis not present

## 2018-12-05 DIAGNOSIS — Z90710 Acquired absence of both cervix and uterus: Secondary | ICD-10-CM | POA: Diagnosis not present

## 2018-12-05 DIAGNOSIS — N390 Urinary tract infection, site not specified: Secondary | ICD-10-CM | POA: Diagnosis not present

## 2018-12-05 DIAGNOSIS — F419 Anxiety disorder, unspecified: Secondary | ICD-10-CM | POA: Diagnosis present

## 2018-12-05 DIAGNOSIS — L03311 Cellulitis of abdominal wall: Secondary | ICD-10-CM | POA: Diagnosis not present

## 2018-12-05 DIAGNOSIS — F32A Depression, unspecified: Secondary | ICD-10-CM | POA: Diagnosis present

## 2018-12-05 DIAGNOSIS — Z66 Do not resuscitate: Secondary | ICD-10-CM | POA: Diagnosis not present

## 2018-12-05 DIAGNOSIS — Z993 Dependence on wheelchair: Secondary | ICD-10-CM

## 2018-12-05 DIAGNOSIS — M810 Age-related osteoporosis without current pathological fracture: Secondary | ICD-10-CM | POA: Diagnosis present

## 2018-12-05 DIAGNOSIS — IMO0002 Reserved for concepts with insufficient information to code with codable children: Secondary | ICD-10-CM | POA: Diagnosis present

## 2018-12-05 DIAGNOSIS — Z79899 Other long term (current) drug therapy: Secondary | ICD-10-CM

## 2018-12-05 LAB — COMPREHENSIVE METABOLIC PANEL
ALT: 8 U/L (ref 0–44)
AST: 10 U/L — ABNORMAL LOW (ref 15–41)
Albumin: 2.7 g/dL — ABNORMAL LOW (ref 3.5–5.0)
Alkaline Phosphatase: 89 U/L (ref 38–126)
Anion gap: 10 (ref 5–15)
BUN: 14 mg/dL (ref 8–23)
CO2: 23 mmol/L (ref 22–32)
Calcium: 8.6 mg/dL — ABNORMAL LOW (ref 8.9–10.3)
Chloride: 104 mmol/L (ref 98–111)
Creatinine, Ser: 0.67 mg/dL (ref 0.44–1.00)
GFR calc Af Amer: >60 mL/min (ref >60)
GFR calc non Af Amer: >60 mL/min (ref >60)
Glucose, Bld: 325 mg/dL — ABNORMAL HIGH (ref 70–99)
Potassium: 4 mmol/L (ref 3.5–5.1)
Sodium: 137 mmol/L (ref 135–145)
Total Bilirubin: 0.7 mg/dL (ref 0.3–1.2)
Total Protein: 6.2 g/dL — ABNORMAL LOW (ref 6.5–8.1)

## 2018-12-05 LAB — CBC WITH DIFFERENTIAL/PLATELET
Abs Immature Granulocytes: 0.04 10*3/uL (ref 0.00–0.07)
Basophils Absolute: 0.1 10*3/uL (ref 0.0–0.1)
Basophils Relative: 1 %
Eosinophils Absolute: 0.2 10*3/uL (ref 0.0–0.5)
Eosinophils Relative: 3 %
HCT: 26.4 % — ABNORMAL LOW (ref 36.0–46.0)
Hemoglobin: 7.7 g/dL — ABNORMAL LOW (ref 12.0–15.0)
Immature Granulocytes: 1 %
Lymphocytes Relative: 15 %
Lymphs Abs: 1.2 10*3/uL (ref 0.7–4.0)
MCH: 25.2 pg — ABNORMAL LOW (ref 26.0–34.0)
MCHC: 29.2 g/dL — ABNORMAL LOW (ref 30.0–36.0)
MCV: 86.6 fL (ref 80.0–100.0)
Monocytes Absolute: 0.6 10*3/uL (ref 0.1–1.0)
Monocytes Relative: 7 %
Neutro Abs: 5.9 10*3/uL (ref 1.7–7.7)
Neutrophils Relative %: 73 %
Platelets: 316 10*3/uL (ref 150–400)
RBC: 3.05 MIL/uL — ABNORMAL LOW (ref 3.87–5.11)
RDW: 15.7 % — ABNORMAL HIGH (ref 11.5–15.5)
WBC: 8 10*3/uL (ref 4.0–10.5)
nRBC: 0 % (ref 0.0–0.2)

## 2018-12-05 LAB — HEMOGLOBIN A1C
Hgb A1c MFr Bld: 7.7 % — ABNORMAL HIGH (ref 4.8–5.6)
Mean Plasma Glucose: 174.29 mg/dL

## 2018-12-05 LAB — GLUCOSE, CAPILLARY: Glucose-Capillary: 183 mg/dL — ABNORMAL HIGH (ref 70–99)

## 2018-12-05 MED ORDER — FERROUS SULFATE 325 (65 FE) MG PO TABS
325.0000 mg | ORAL_TABLET | Freq: Every day | ORAL | Status: DC
  Administered 2018-12-06 – 2018-12-07 (×2): 325 mg via ORAL
  Filled 2018-12-05 (×2): qty 1

## 2018-12-05 MED ORDER — VANCOMYCIN HCL 10 G IV SOLR
1750.0000 mg | Freq: Once | INTRAVENOUS | Status: AC
  Administered 2018-12-06: 1750 mg via INTRAVENOUS
  Filled 2018-12-05 (×3): qty 1750

## 2018-12-05 MED ORDER — APIXABAN 5 MG PO TABS
5.0000 mg | ORAL_TABLET | Freq: Two times a day (BID) | ORAL | Status: DC
  Administered 2018-12-05 – 2018-12-07 (×4): 5 mg via ORAL
  Filled 2018-12-05 (×4): qty 1

## 2018-12-05 MED ORDER — ESCITALOPRAM OXALATE 10 MG PO TABS
5.0000 mg | ORAL_TABLET | Freq: Every day | ORAL | Status: DC
  Administered 2018-12-06 – 2018-12-07 (×2): 5 mg via ORAL
  Filled 2018-12-05 (×2): qty 1

## 2018-12-05 MED ORDER — ACETAMINOPHEN 325 MG PO TABS: 650.0000 mg | ORAL_TABLET | Freq: Four times a day (QID) | ORAL | Status: DC | PRN

## 2018-12-05 MED ORDER — VANCOMYCIN HCL 10 G IV SOLR
1250.0000 mg | INTRAVENOUS | Status: DC
  Administered 2018-12-06: 1250 mg via INTRAVENOUS
  Filled 2018-12-05 (×2): qty 1250

## 2018-12-05 MED ORDER — ACETAMINOPHEN 650 MG RE SUPP: 650.0000 mg | Freq: Four times a day (QID) | RECTAL | Status: DC | PRN

## 2018-12-05 MED ORDER — INSULIN ASPART 100 UNIT/ML ~~LOC~~ SOLN
0.0000 [IU] | Freq: Three times a day (TID) | SUBCUTANEOUS | Status: DC
  Administered 2018-12-06: 3 [IU] via SUBCUTANEOUS
  Administered 2018-12-06: 5 [IU] via SUBCUTANEOUS
  Administered 2018-12-07: 09:00:00 3 [IU] via SUBCUTANEOUS
  Administered 2018-12-07: 2 [IU] via SUBCUTANEOUS

## 2018-12-05 MED ORDER — DILTIAZEM HCL ER COATED BEADS 120 MG PO CP24
120.0000 mg | ORAL_CAPSULE | Freq: Every day | ORAL | Status: DC
  Administered 2018-12-06 – 2018-12-07 (×2): 120 mg via ORAL
  Filled 2018-12-05 (×2): qty 1

## 2018-12-05 MED ORDER — IOHEXOL 300 MG/ML  SOLN
100.0000 mL | Freq: Once | INTRAMUSCULAR | Status: AC | PRN
  Administered 2018-12-05: 100 mL via INTRAVENOUS

## 2018-12-05 MED ORDER — BUPROPION HCL ER (XL) 150 MG PO TB24: 300.0000 mg | ORAL_TABLET | Freq: Every day | ORAL | Status: DC

## 2018-12-05 MED ORDER — OXYCODONE HCL 5 MG PO TABS
5.0000 mg | ORAL_TABLET | Freq: Four times a day (QID) | ORAL | Status: DC | PRN
  Administered 2018-12-05 – 2018-12-07 (×4): 5 mg via ORAL
  Filled 2018-12-05 (×4): qty 1

## 2018-12-05 MED ORDER — METOPROLOL TARTRATE 25 MG PO TABS
25.0000 mg | ORAL_TABLET | Freq: Two times a day (BID) | ORAL | Status: DC
  Administered 2018-12-05 – 2018-12-06 (×2): 25 mg via ORAL
  Filled 2018-12-05 (×3): qty 1

## 2018-12-05 MED ORDER — INSULIN ASPART 100 UNIT/ML ~~LOC~~ SOLN
0.0000 [IU] | Freq: Every day | SUBCUTANEOUS | Status: DC
  Administered 2018-12-06: 2 [IU] via SUBCUTANEOUS

## 2018-12-05 MED ORDER — FUROSEMIDE 20 MG PO TABS: 20.0000 mg | ORAL_TABLET | Freq: Every day | ORAL | Status: DC

## 2018-12-05 MED ORDER — ALBUTEROL SULFATE (2.5 MG/3ML) 0.083% IN NEBU: 2.5000 mg | INHALATION_SOLUTION | Freq: Four times a day (QID) | RESPIRATORY_TRACT | Status: DC | PRN

## 2018-12-05 MED ORDER — TIOTROPIUM BROMIDE MONOHYDRATE 1.25 MCG/ACT IN AERS: 2.0000 | INHALATION_SPRAY | Freq: Every day | RESPIRATORY_TRACT | Status: DC

## 2018-12-05 MED ORDER — PANTOPRAZOLE SODIUM 40 MG PO TBEC: 40.0000 mg | DELAYED_RELEASE_TABLET | Freq: Every day | ORAL | Status: DC | PRN

## 2018-12-05 NOTE — H&P (Addendum)
History and Physical    [name] OFB:[id number] DOB: 1951/03/17 DOA: 12/05/2018  PCP: [name], MD  Patient coming from: Home  I have personally briefly reviewed patient's old medical records in [place]  Chief Complaint: Abdominal wall wound  HPI: [name] is a 68 y.o. female with medical history significant for chronic diastolic CHF, atrial fibrillation on Eliquis, history of LV thrombus, COPD, type 2 diabetes, hypertension, anemia, depression, and recurrent UTIs who presents to the ED with an abdominal wall wound.  Patient first noticed a small wound to her lower abdomen about 2 weeks ago initially without discharge.  This was managed conservatively initially.  She had noticed worsening erythema and eventually purulent drainage from the wound.  She had worsening pain and noticed to have indurated skin across her lower abdomen.  Of note patient has been treated with multiple rounds of antibiotics for apparently recurrent UTI over the last 6 months with medications including Keflex, ciprofloxacin, and fosfomycin last urine culture on file 10/29/2018 showed ESBL Klebsiella pneumonia.  Since then she has been treated with IM ertapenem for two 1 week courses with last dose given on 11/26/2018, per my conversation with her home health nurse.  She currently denies any dysuria, hematuria, or other urinary symptoms.  She reports occasional nausea and chills but denies any subjective fevers or diaphoresis.  She denies any chest pain, palpitations, dyspnea, lightheadedness, dizziness, loss of consciousness, vomiting, diarrhea, or swelling in her legs.  ED Course:  Initial vitals showed BP 136/64, pulse 79, RR 18, temp 98.7 Fahrenheit, SPO2 100% on room air.  Labs were notable for WBC 8.0, hemoglobin 7.7, platelets 316, BUN 14, creatinine 0.67, albumin 2.7, total protein 6.2.  A CT abdomen/pelvis with contrast was obtained which showed diverticulosis without  diverticulitis and no evidence of abscess or other acute intra-abdominal or pelvic abnormality.  The hospitalist service was consulted to admit for further management of abdominal wound/cellulitis.  Review of Systems: As per HPI otherwise 10 point review of systems negative.    Past Medical History:  Diagnosis Date  . Atrial fibrillation ([place])   . CHF (congestive heart failure) ([place])   . Depression   . Diabetes mellitus without complication ([place])   . 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 Hypertension   . Morbid obesity ([place])   . Osteoporosis   . UTI (lower urinary tract infection) 01/2016   Cipro for Klebsiella pneumoniae isolate    Past Surgical History:  Procedure Laterality Date  . ABDOMINAL HYSTERECTOMY    . BIOPSY  09/06/2018   Procedure: BIOPSY;  Surgeon: Danie Binder, MD;  [facility];  Service: Endoscopy;;  gastric bx's  . CESAREAN SECTION    . CHOLECYSTECTOMY    . ESOPHAGEAL DILATION  09/06/2018   Procedure: ESOPHAGEAL DILATION;  Surgeon: Danie Binder, MD;  [facility];  Service: Endoscopy;;  . ESOPHAGOGASTRODUODENOSCOPY (EGD) WITH PROPOFOL N/A 09/06/2018   Procedure: ESOPHAGOGASTRODUODENOSCOPY (EGD) WITH PROPOFOL;  Surgeon: Danie Binder, MD;  [facility];  Service: Endoscopy;  Laterality: N/A;  dilatation  . FEMUR IM NAIL Left 02/20/2016  . FEMUR IM NAIL Left 02/20/2016   Procedure: INTRAMEDULLARY (IM) RETROGRADE FEMORAL NAILING;  Surgeon: Leandrew Koyanagi, MD;  Location: [facility];  Service: Orthopedics;  Laterality: Left;  . PANCREAS SURGERY  1967   1 cyst excised and one cyst drained  . TEE WITHOUT CARDIOVERSION N/A 04/05/2018   Procedure:  TRANSESOPHAGEAL ECHOCARDIOGRAM (TEE);  Surgeon: Dorothy Spark, MD;  Location: [facility];  Service: Cardiovascular;  Laterality: N/A;  . [place]    . WRIST FRACTURE SURGERY       reports that she has never smoked. She has never used  smokeless tobacco. She reports that she does not drink alcohol or use drugs.  Allergies  Allergen Reactions  . Propranolol Swelling  . Topamax [Topiramate]     hallucinations  . Latex Itching and Rash    Family History  Problem Relation Age of Onset  . Diabetes Mother        died in her 18's of a stroke  . Cancer Mother   . Stroke Mother   . Breast cancer Mother   . Diabetes Father        died in his 33's of a stroke.  . Stroke Father   . Diabetes Brother        died @ 3 of a stroke.  . Stroke Brother   . Diabetes Maternal Grandmother   . Diabetes Maternal Grandfather   . Diabetes Paternal Grandmother   . Diabetes Paternal Grandfather   . Breast cancer Maternal Aunt      Prior to Admission medications   Medication Sig Start Date End Date Taking? Authorizing Provider  acetaminophen (TYLENOL) 500 MG tablet Take 1,000 mg by mouth every 8 (eight) hours as needed for mild pain or headache.     [provider]  albuterol (ACCUNEB) 1.25 MG/3ML nebulizer solution Take 1 ampule by nebulization every 6 (six) hours as needed for wheezing.    [provider]  albuterol (PROVENTIL HFA;VENTOLIN HFA) 108 (90 Base) MCG/ACT inhaler Inhale 2 puffs into the lungs every 6 (six) hours as needed for wheezing or shortness of breath. 08/14/18   Kathie Dike, MD  apixaban (ELIQUIS) 5 MG TABS tablet Take 1 tablet (5 mg total) by mouth 2 (two) times daily. 07/21/18   Herminio Commons, MD  buPROPion (WELLBUTRIN XL) 300 MG 24 hr tablet Take 300 mg by mouth daily. 07/06/13   [provider]  dicyclomine (BENTYL) 10 MG capsule Take 2 capsules (20 mg total) by mouth 3 (three) times daily as needed for spasms (diarrhea). 09/10/18   Johnson, Clanford L, MD  diltiazem (CARDIZEM CD) 120 MG 24 hr capsule Take 1 capsule (120 mg total) by mouth daily. 09/10/18   Johnson, Clanford L, MD  escitalopram (LEXAPRO) 5 MG tablet Take 1 tablet (5 mg total) by mouth daily. Patient taking  differently: Take 10 mg by mouth daily.  08/25/18 10/29/18  Manuella Ghazi, Pratik D, DO  ferrous sulfate 325 (65 FE) MG tablet Take 325 mg by mouth daily with breakfast.    [provider]  fluticasone (FLONASE) 50 MCG/ACT nasal spray Place 2 sprays into both nostrils daily.    [provider]  furosemide (LASIX) 40 MG tablet Take 1 tablet (40 mg total) by mouth daily. Patient taking differently: Take 20 mg by mouth daily.  08/14/18   Kathie Dike, MD  glipiZIDE (GLUCOTROL) 10 MG tablet Take 10 mg by mouth 2 (two) times daily. 03/02/18   [provider]  guaifenesin (MUCUS RELIEF) 400 MG TABS tablet Take 1 tablet (400 mg total) by mouth every 6 (six) hours as needed (mucus and chest congestion). Patient not taking: Reported on 10/22/2018 09/10/18   Murlean Iba, MD  loratadine (CLARITIN) 10 MG tablet Take 10 mg by mouth daily.    [provider]  metoprolol tartrate (LOPRESSOR) 25 MG tablet Take 1 tablet (25 mg total) by mouth 2 (two) times daily. 09/10/18   Johnson, Clanford L, MD  pantoprazole (PROTONIX) 40 MG tablet Take 1 tablet (40 mg total) by mouth daily before breakfast. Patient taking differently: Take 40 mg by mouth daily as needed (for GERD).  09/11/18   Johnson, Clanford L, MD  polyethylene glycol (MIRALAX / GLYCOLAX) packet Take 17 g by mouth daily as needed for mild constipation or moderate constipation.     [provider]  potassium chloride SA (K-DUR,KLOR-CON) 20 MEQ tablet Take 1 tablet (20 mEq total) by mouth daily. Patient not taking: Reported on 10/22/2018 07/21/18   Herminio Commons, MD  simethicone (MYLICON) 80 MG chewable tablet Chew 1 tablet (80 mg total) by mouth 4 (four) times daily as needed for flatulence. Patient not taking: Reported on 10/22/2018 09/13/18   Kathie Dike, MD  SPIRIVA RESPIMAT 1.25 MCG/ACT AERS Inhale 2 sprays into the lungs daily. 01/21/18   [provider]  traZODone (DESYREL) 50 MG tablet Take 1 tablet (50  mg total) by mouth at bedtime for 30 days. Patient taking differently: Take 100 mg by mouth at bedtime as needed for sleep.  09/13/18 10/29/18  Kathie Dike, MD    Physical Exam: Vitals:   12/05/18 1415 12/05/18 1515 12/05/18 1645 12/05/18 1745  BP: (!) 143/99     Pulse: 79 81 80 78  Resp:      Temp:      TempSrc:      SpO2: 100% 98% 99% 99%    Constitutional: Obese woman resting supine in bed, NAD, calm, comfortable Eyes: PERRL, lids and conjunctivae normal ENMT: Mucous membranes are moist. Posterior pharynx clear of any exudate or lesions.Normal dentition.  Neck: normal, supple, no masses. Respiratory: clear to auscultation bilaterally, no wheezing, no crackles. Normal respiratory effort. No accessory muscle use.  Cardiovascular: Regular rate and rhythm, no murmurs / rubs / gallops. No extremity edema.  Abdomen: ~ 1.5cm open wound RLQ w/ purulent discharge and surrounding erythema and induration tracking across the anterior abdomen, tender to palpation.  No tenderness to palpation epigastric region or upper quadrants. Musculoskeletal: no clubbing / cyanosis. No joint deformity upper and lower extremities. Good ROM, no contractures. Normal muscle tone.  Skin:  ~ 1.5cm open wound RLQ w/ purulent discharge and surrounding erythema and induration tracking across the anterior abdomen, tender to palpation. Neurologic: CN 2-12 grossly intact. Sensation intact, Strength 5/5 in all 4.  Psychiatric: Normal judgment and insight. Alert and oriented x 3. Normal mood.       Labs on Admission: I have personally reviewed following labs and imaging studies  CBC: Recent Labs  Lab 12/05/18 1318  WBC 8.0  NEUTROABS 5.9  HGB 7.7*  HCT 26.4*  MCV 86.6  PLT 196   Basic Metabolic Panel: Recent Labs  Lab 12/05/18 1318  NA 137  K 4.0  CL 104  CO2 23  GLUCOSE 325*  BUN 14  CREATININE 0.67  CALCIUM 8.6*   GFR: CrCl cannot be calculated (Unknown ideal weight.). Liver Function Tests:  Recent Labs  Lab 12/05/18 1318  AST 10*  ALT 8  ALKPHOS 89  BILITOT 0.7  PROT 6.2*  ALBUMIN 2.7*   No results for input(s): LIPASE, AMYLASE in the last 168 hours. No results for input(s): AMMONIA in the last 168 hours. Coagulation Profile: No results for input(s): INR, PROTIME in the last 168 hours. Cardiac Enzymes: No results for  input(s): CKTOTAL, CKMB, CKMBINDEX, TROPONINI in the last 168 hours. BNP (last 3 results) No results for input(s): PROBNP in the last 8760 hours. HbA1C: No results for input(s): HGBA1C in the last 72 hours. CBG: No results for input(s): GLUCAP in the last 168 hours. Lipid Profile: No results for input(s): CHOL, HDL, LDLCALC, TRIG, CHOLHDL, LDLDIRECT in the last 72 hours. Thyroid Function Tests: No results for input(s): TSH, T4TOTAL, FREET4, T3FREE, THYROIDAB in the last 72 hours. Anemia Panel: No results for input(s): VITAMINB12, FOLATE, FERRITIN, TIBC, IRON, RETICCTPCT in the last 72 hours. Urine analysis:    Component Value Date/Time   COLORURINE YELLOW 11/27/2018 1446   APPEARANCEUR CLOUDY (A) 11/27/2018 1446   LABSPEC 1.019 11/27/2018 1446   PHURINE 5.0 11/27/2018 1446   GLUCOSEU 50 (A) 11/27/2018 1446   HGBUR NEGATIVE 11/27/2018 1446   BILIRUBINUR NEGATIVE 11/27/2018 1446   KETONESUR NEGATIVE 11/27/2018 1446   PROTEINUR 30 (A) 11/27/2018 1446   UROBILINOGEN 0.2 12/01/2007 1025   NITRITE NEGATIVE 11/27/2018 1446   LEUKOCYTESUR SMALL (A) 11/27/2018 1446    Radiological Exams on Admission: Ct Abdomen Pelvis W Contrast  Result Date: 12/05/2018 CLINICAL DATA:  Pt states she has several chronic abscesses to abdomen. Pt has also been on antibiotics for 6 months for UTI. Today is having abd pain. EXAM: CT ABDOMEN AND PELVIS WITH CONTRAST TECHNIQUE: Multidetector CT imaging of the abdomen and pelvis was performed using the standard protocol following bolus administration of intravenous contrast. CONTRAST:  137m OMNIPAQUE IOHEXOL 300 MG/ML  SOLN  COMPARISON:  09/03/2018 FINDINGS: Lower chest: Trace right pleural effusion. Hepatobiliary: No focal liver abnormality is seen. Status post cholecystectomy. Dilated common bile duct likely secondary to post cholecystectomy state. Pancreas: Severe pancreatic atrophy. Spleen: Normal in size without focal abnormality. Adrenals/Urinary Tract: Adrenal glands are unremarkable. Kidneys are normal, without renal calculi, focal lesion, or hydronephrosis. Bladder is unremarkable. Stomach/Bowel: Stomach is within normal limits. Appendix appears normal. No evidence of bowel wall thickening, distention, or inflammatory changes. Diverticulosis without evidence of diverticulitis. Vascular/Lymphatic: Abdominal aortic atherosclerosis. Normal caliber abdominal aorta. No lymphadenopathy. Reproductive: Status post hysterectomy. No adnexal masses. Other: Small fat containing umbilical hernia. No ascites. 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 Musculoskeletal: No acute osseous abnormality. No aggressive osseous lesion. Degenerative disc disease with disc height loss at L4-5 and L5-S1 with bilateral facet arthropathy. Grade 1 anterolisthesis of L4 on L5 secondary to facet disease. Severe bilateral foraminal stenosis at L4-5. IMPRESSION: 1. No acute abdominal or pelvic pathology. 2. Diverticulosis without evidence of diverticulitis. Electronically Signed   By: HKathreen Devoid  On: 12/05/2018 15:41    EKG: Not obtained.  Assessment/Plan Principal Problem:   Wound, open, anterior abdominal wall Active Problems:   Diabetes type 2, uncontrolled (HCC)   HTN (hypertension)   Depression   Atrial fibrillation (HCC)   Chronic diastolic CHF (congestive heart failure) ([facility])   COPD [place]chronic obstructive [place] disease) [place]  [place] a 68y.o. female with medical history significant for chronic diastolic CHF, atrial fibrillation on Eliquis, history of LV thrombus,  COPD, type 2 diabetes, hypertension, anemia, depression, and recurrent UTIs who is admitted with right lower anterior abdominal wall wound with cellulitis.  Abdominal wall wound/cellulitis: Suspect staph skin infection likely from skin on skin friction.  She has purulent drainage from her wound with surrounding erythema and large area of induration without fluctuation.  No obvious abscess on exam or by  CT imaging. -Start IV vancomycin, can likely transition to oral antibiotics in 1-2 days -Wound care consult  Type 2 diabetes: Serum glucose elevated on admission.  She is on glipizide as an outpatient. -SSI while admitted, adjust as needed  Atrial fibrillation/history of LV thrombus on Eliquis: Appears to be in sinus rhythm with appropriate rate.  She denies any obvious bleeding. -Continue diltiazem and metoprolol -Continue Eliquis  Acute on chronic anemia: Hemoglobin 7.7 on admission, Baseline hemoglobin appears to be between 8.5-9.5.  He has no obvious bleeding, continues to take Eliquis due to history of A. fib and LV thrombus. -Continue to monitor while on Eliquis  Recurrent UTI: Patient with multiple courses of antibiotics over the last 6 months most recently with IM ertapenem for 2 weeks with last dose on 11/26/2018 per her home health nurse.  She currently denies any urinary symptoms therefore will not investigate further as this appears to be resolved.  Chronic diastolic CHF: EF 03-83% by echocardiogram 09/04/2018.  Currently stable and appears well compensated. -Continue home Lasix -Monitor I/O's  Hypertension: Slightly hypertensive on admission. -Continue home diltiazem and metoprolol  COPD: Chronic and stable without evidence of bronchospasm, saturating well on room air. -Continue Spiriva and as needed albuterol  Depression: -Continue Lexapro and Wellbutrin  DVT prophylaxis: Eliquis Code Status: DNR, confirmed with patient Family Communication: None present on  admission Disposition Plan: Likely discharge to home in 1-2 days Consults called: None Admission status: Observation   Zada Finders MD [facility] Pager [phone]  If 7PM-7AM, please contact night-coverage [email or web address]  12/05/2018, 7:41 PM

## 2018-12-05 NOTE — ED Triage Notes (Signed)
Pt states she has several chronic abscesses to abdomen. Pt has also been on antibiotics for 6 months for UTI. States PCP thinks there is no infection in abdomen. Pt states she is here for pain today.

## 2018-12-05 NOTE — ED Provider Notes (Signed)
  Physical Exam  BP (!) 143/99   Pulse 78   Temp 98.7 F (37.1 C) (Oral)   Resp 18   SpO2 99%   Physical Exam Well-appearing elderly female.  Erythema and induration along the lower part of the abdomen as previously described.  Awake alert and oriented.  GCS 15. ED Course/Procedures   Clinical Course as of Dec 04 1916  Sun Dec 05, 2018  1429 Noted to have been in a similar range in the past. Other findings on CBC suggest this is likely a more chronic anemia.   Hemoglobin(!): 7.7 [SJ]    Clinical Course User Index [SJ] Joy, Shawn C, PA-C    Procedures  MDM  Patient handed off to me by previous physician assistant.  CT scan does not show any identifiable abscess but given the extensive the induration and purulent drainage from the skin it appears that whatever the disease processes it is spreading.  She is already failed courses of antibiotics as an outpatient including a course of IV medication.  Has not tried vancomycin for this so we will admit to the hospitalist for vancomycin therapy.  Because of the failure of outpatient treatment with multiple antibiotics there is a possibility that this is in fact autoimmune and the hospitalist will follow up with this.  Otherwise no acute events under my care.  I had a long discussion with the patient regarding these events and she was in agreement with the plan.      Andee Poles, MD 12/05/18 Lurena Nida    Elnora Morrison, MD 12/07/18 [phone]

## 2018-12-05 NOTE — Progress Notes (Signed)
Pharmacy Antibiotic Note  [name] is a 68 y.o. female admitted on 12/05/2018 with abdominal wall wound.  Pharmacy has been consulted for vancomycin dosing. She has had recurrent UTIs over last 6 months and has been on several courses of abx including Keflex, Cipro, fosfomycin, IM ertapenem. Renal function is normal. She is afebrile, WBC are normal. CT abd - no acute pathology.  Vancomycin 1250 mg IV q24h. Goal AUC 400-550. Expected AUC: 518.4 SCr used: 0.8 Weight: 95.5 kg  Plan: Vancomycin 1750 mg IV once then 1250 mg IV q24h Monitor renal function, clinical progress     Temp (24hrs), Avg:98.7 F (37.1 C), Min:98.7 F (37.1 C), Max:98.7 F (37.1 C)  Recent Labs  Lab 12/05/18 1318  WBC 8.0  CREATININE 0.67    CrCl cannot be calculated (Unknown ideal weight.).    Allergies  Allergen Reactions  . Propranolol Swelling  . Topamax [Topiramate]     hallucinations  . Latex Itching and Rash    Antimicrobials this admission: vancomycin 4/19 >>   Dose adjustments this admission:   Microbiology results: n/a  Thank you for allowing pharmacy to be a part of this patient's care.  Renold Genta, PharmD, BCPS Clinical Pharmacist Clinical phone for 12/05/2018 until 10p is [phone] 12/05/2018 7:21 PM  **Pharmacist phone directory can now be found on [email or web address] listed under [place]**

## 2018-12-05 NOTE — ED Provider Notes (Signed)
[facility] EMERGENCY DEPARTMENT Provider Note   CSN: [id number] Arrival date & time: 12/05/18  1128    History   Chief Complaint Chief Complaint  Patient presents with   Abscess    HPI [name] is a 68 y.o. female.     HPI   [name] is a 68 y.o. female, with a history of A. fib, CHF, DM, morbid obesity, HTN, and COPD, presenting to the ED with a concern for abscess to the abdomen.  She states she has had recurrent abscesses to the abdominal skin over the last several months.  Pain will arise, drain, seem to resolve, but then recur.  The current abscess is been present for about 2 weeks.  Patient notes she is under the care of her PCP for persistent UTI symptoms.  She has been treated with several antibiotics, including Keflex, Cipro, fosfomycin, and most recently a 14-day course of IM ertapenem, ending April 10. With these persistent UTI symptoms, she notes dysuria and suprapubic pain.  She denies fever/chills, cough, shortness of breath, chest pain, other abdominal pain, abnormal vaginal discharge, vaginal bleeding, hematuria, N/V/D, flank pain, acute back pain, or any other complaints.    Past Medical History:  Diagnosis Date   Atrial fibrillation (HCC)    CHF (congestive heart failure) ([place])    Depression    Diabetes mellitus without complication ([place])    History of transesophageal echocardiography (TEE) 03/2018   LV thrombus   Hypertension    Morbid obesity ([place])    Osteoporosis    UTI (lower urinary tract infection) 01/2016   Cipro for Klebsiella pneumoniae isolate    Patient Active Problem List   Diagnosis Date Noted   Hypotension due to hypovolemia    Gastritis due to nonsteroidal anti-inflammatory drug    Esophageal dysphagia    GI bleed 09/03/2018   Coagulopathy (HCC)    Colitis    Lower abdominal pain    Rectal bleeding    Dehydration 08/16/2018   Acute on chronic diastolic CHF (congestive heart  failure) ([place]) 08/10/2018   Acute lower UTI 08/10/2018   Hypokalemia 08/10/2018   COPD (chronic obstructive pulmonary disease) ([place]) 08/10/2018   Thyroid lesion 08/10/2018   Closed fracture of right ankle 06/10/2018   Closed fracture of left tibia and fibula with routine healing 04/13/18 06/10/2018   Unspecified atrial fibrillation ([place]) 04/15/2018   Chronic diastolic CHF (congestive heart failure) ([place]) 04/15/2018   CKD (chronic kidney disease), stage III ([place]) 04/15/2018   Closed right fibular fracture 04/15/2018   Left tibial fracture 04/14/2018   CHF exacerbation ([place]) 04/10/2018   SOB (shortness of breath)    Acute CHF (congestive heart failure) ([place]) 04/01/2018   Atrial fibrillation with RVR ([place]) 04/01/2018   [place] 03/11/2016   Muscle spasms of lower extremity 03/04/2016   Acute renal insufficiency 02/26/2016   History of MRSA infection 02/26/2016   Diabetes type 2, uncontrolled ([place]) 02/19/2016   HTN (hypertension) 02/19/2016   Fracture, femur, distal ([place]) 02/19/2016   Fall 02/19/2016   Depression 02/19/2016   Closed left femoral fracture, initial encounter 02/19/2016   Femur fracture, left ([place]) 02/19/2016    Past Surgical History:  Procedure Laterality Date   ABDOMINAL HYSTERECTOMY     BIOPSY  09/06/2018   Procedure: BIOPSY;  Surgeon: Danie Binder, MD;  [facility];  Service: Endoscopy;;  gastric bx's   CESAREAN SECTION     CHOLECYSTECTOMY     ESOPHAGEAL DILATION  09/06/2018   Procedure: ESOPHAGEAL DILATION;  Surgeon: Danie Binder, MD;  [facility];  Service: Endoscopy;;   ESOPHAGOGASTRODUODENOSCOPY (EGD) WITH PROPOFOL N/A 09/06/2018   Procedure: ESOPHAGOGASTRODUODENOSCOPY (EGD) WITH PROPOFOL;  Surgeon: Danie Binder, MD;  [facility];  Service: Endoscopy;  Laterality: N/A;  dilatation   FEMUR IM NAIL Left 02/20/2016   FEMUR IM NAIL Left 02/20/2016   Procedure: INTRAMEDULLARY (IM) RETROGRADE  FEMORAL NAILING;  Surgeon: Leandrew Koyanagi, MD;  Location: [place];  Service: Orthopedics;  Laterality: Left;   PANCREAS SURGERY  1967   1 cyst excised and one cyst drained   TEE WITHOUT CARDIOVERSION N/A 04/05/2018   Procedure: TRANSESOPHAGEAL ECHOCARDIOGRAM (TEE);  Surgeon: Dorothy Spark, MD;  [facility] END[facility]COPY;  Service: Cardiovascular;  Laterality: N/A;   VEIN LIGATION AND STRIPPING     WRIST FRACTURE SURGERY       OB History   No obstetric history on file.      Home Medications    Prior to Admission medications   Medication Sig Start Date End Date Taking? Authorizing Provider  acetaminophen (TYLENOL) 500 MG tablet Take 1,000 mg by mouth every 8 (eight) hours as needed for mild pain or headache.     [provider]  albuterol (ACCUNEB) 1.25 MG/3ML nebulizer solution Take 1 ampule by nebulization every 6 (six) hours as needed for wheezing.    [provider]  albuterol (PROVENTIL HFA;VENTOLIN HFA) 108 (90 Base) MCG/ACT inhaler Inhale 2 puffs into the lungs every 6 (six) hours as needed for wheezing or shortness of breath. 08/14/18   Kathie Dike, MD  apixaban (ELIQUIS) 5 MG TABS tablet Take 1 tablet (5 mg total) by mouth 2 (two) times daily. 07/21/18   Herminio Commons, MD  buPROPion (WELLBUTRIN XL) 300 MG 24 hr tablet Take 300 mg by mouth daily. 07/06/13   [provider]  cephALEXin (KEFLEX) 500 MG capsule Take 500 mg by mouth every 12 (twelve) hours. 7 day course prescribed on 10/15/2018 10/15/18   [provider]  ciprofloxacin (CIPRO) 500 MG tablet Take 1 tablet (500 mg total) by mouth 2 (two) times daily. 10/22/18   Fredia Sorrow, MD  dicyclomine (BENTYL) 10 MG capsule Take 2 capsules (20 mg total) by mouth 3 (three) times daily as needed for spasms (diarrhea). 09/10/18   Johnson, Clanford L, MD  diltiazem (CARDIZEM CD) 120 MG 24 hr capsule Take 1 capsule (120 mg total) by mouth daily. 09/10/18   Johnson, Clanford L, MD  escitalopram  (LEXAPRO) 5 MG tablet Take 1 tablet (5 mg total) by mouth daily. Patient taking differently: Take 10 mg by mouth daily.  08/25/18 10/29/18  Manuella Ghazi, Pratik D, DO  ferrous sulfate 325 (65 FE) MG tablet Take 325 mg by mouth daily with breakfast.    [provider]  fluticasone (FLONASE) 50 MCG/ACT nasal spray Place 2 sprays into both nostrils daily.    [provider]  furosemide (LASIX) 40 MG tablet Take 1 tablet (40 mg total) by mouth daily. Patient taking differently: Take 20 mg by mouth daily.  08/14/18   Kathie Dike, MD  glipiZIDE (GLUCOTROL) 10 MG tablet Take 10 mg by mouth 2 (two) times daily. 03/02/18   [provider]  guaifenesin (MUCUS RELIEF) 400 MG TABS tablet Take 1 tablet (400 mg total) by mouth every 6 (six) hours as needed (mucus and chest congestion). Patient not taking: Reported on 10/22/2018 09/10/18   Murlean Iba, MD  loratadine (  CLARITIN) 10 MG tablet Take 10 mg by mouth daily.    [provider]  metoprolol tartrate (LOPRESSOR) 25 MG tablet Take 1 tablet (25 mg total) by mouth 2 (two) times daily. 09/10/18   Johnson, Clanford L, MD  pantoprazole (PROTONIX) 40 MG tablet Take 1 tablet (40 mg total) by mouth daily before breakfast. Patient taking differently: Take 40 mg by mouth daily as needed (for GERD).  09/11/18   Johnson, Clanford L, MD  polyethylene glycol (MIRALAX / GLYCOLAX) packet Take 17 g by mouth daily as needed for mild constipation or moderate constipation.     [provider]  potassium chloride SA (K-DUR,KLOR-CON) 20 MEQ tablet Take 1 tablet (20 mEq total) by mouth daily. Patient not taking: Reported on 10/22/2018 07/21/18   Herminio Commons, MD  simethicone (MYLICON) 80 MG chewable tablet Chew 1 tablet (80 mg total) by mouth 4 (four) times daily as needed for flatulence. Patient not taking: Reported on 10/22/2018 09/13/18   Kathie Dike, MD  SPIRIVA RESPIMAT 1.25 MCG/ACT AERS Inhale 2 sprays into the lungs daily.  01/21/18   [provider]  traZODone (DESYREL) 50 MG tablet Take 1 tablet (50 mg total) by mouth at bedtime for 30 days. Patient taking differently: Take 100 mg by mouth at bedtime as needed for sleep.  09/13/18 10/29/18  Kathie Dike, MD    Family History Family History  Problem Relation Age of Onset   Diabetes Mother        died in her 77's of a stroke   Cancer Mother    Stroke Mother    Breast cancer Mother    Diabetes Father        died in his 83's of a stroke.   Stroke Father    Diabetes Brother        died @ 70 of a stroke.   Stroke Brother    Diabetes Maternal Grandmother    Diabetes Maternal Grandfather    Diabetes Paternal Grandmother    Diabetes Paternal Grandfather    Breast cancer Maternal Aunt     Social History Social History   Tobacco Use   Smoking status: Never Smoker   Smokeless tobacco: Never Used  Substance Use Topics   Alcohol use: No   Drug use: No     Allergies   Propranolol; Topamax [topiramate]; and Latex   Review of Systems Review of Systems  Constitutional: Negative for chills and fever.  Respiratory: Negative for cough and shortness of breath.   Cardiovascular: Negative for chest pain.  Gastrointestinal: Negative for abdominal pain (none acute; recurrent suprapubic pain), blood in stool, diarrhea, nausea and vomiting.  Genitourinary: Positive for dysuria. Negative for flank pain, hematuria, pelvic pain, vaginal bleeding and vaginal discharge.  Musculoskeletal: Negative for back pain.  Skin: Positive for wound.  Neurological: Negative for syncope, weakness and numbness.  All other systems reviewed and are negative.    Physical Exam Updated Vital Signs BP (!) 185/82 (BP Location: Right Arm)    Pulse 81    Temp 98.7 F (37.1 C) (Oral)    Resp 18    SpO2 100%   Physical Exam Vitals signs and nursing note reviewed.  Constitutional:      General: She is not in acute distress.    Appearance: She is  well-developed. She is obese. She is not diaphoretic.  HENT:     Head: Normocephalic and atraumatic.     Mouth/Throat:     Mouth: Mucous membranes are moist.  Pharynx: Oropharynx is clear.  Eyes:     Conjunctiva/sclera: Conjunctivae normal.  Neck:     Musculoskeletal: Neck supple.  Cardiovascular:     Rate and Rhythm: Normal rate and regular rhythm.     Pulses: Normal pulses.     Comments: Tactile temperature in the extremities appropriate and equal bilaterally. Pulmonary:     Effort: Pulmonary effort is normal. No respiratory distress.  Abdominal:     Palpations: Abdomen is soft.     Tenderness: There is abdominal tenderness. There is no guarding.     Comments: Ulcerated wound approximately 2.5 cm in diameter to the skin of the right abdomen.  Surrounding erythema, induration, and tenderness. Multiple masslike structures noted upon palpation of the right and central abdomen.  Some of these are tender, but noted to be firm without apparent fluctuance.  Lymphadenopathy:     Cervical: No cervical adenopathy.  Skin:    General: Skin is warm and dry.  Neurological:     Mental Status: She is alert.  Psychiatric:        Mood and Affect: Mood and affect normal.        Speech: Speech normal.        Behavior: Behavior normal.          ED Treatments / Results  Labs (all labs ordered are listed, but only abnormal results are displayed) Labs Reviewed  COMPREHENSIVE METABOLIC PANEL - Abnormal; Notable for the following components:      Result Value   Glucose, Bld 325 (*)    Calcium 8.6 (*)    Total Protein 6.2 (*)    Albumin 2.7 (*)    AST 10 (*)    All other components within normal limits  CBC WITH DIFFERENTIAL/PLATELET - Abnormal; Notable for the following components:   RBC 3.05 (*)    Hemoglobin 7.7 (*)    HCT 26.4 (*)    MCH 25.2 (*)    MCHC 29.2 (*)    RDW 15.7 (*)    All other components within normal limits    EKG None  Radiology No results  found.  Procedures Procedures (including critical care time)  Medications Ordered in ED Medications  iohexol (OMNIPAQUE) 300 MG/ML solution 100 mL (100 mLs Intravenous Contrast Given 12/05/18 1455)     Initial Impression / Assessment and Plan / ED Course  I have reviewed the triage vital signs and the nursing notes.  Pertinent labs & imaging results that were available during my care of the patient were reviewed by me and considered in my medical decision making (see chart for details).  Clinical Course as of Dec 05 1539  Sun Dec 05, 2018  1429 Noted to have been in a similar range in the past. Other findings on CBC suggest this is likely a more chronic anemia.   Hemoglobin(!): 7.7 [SJ]    Clinical Course User Index [SJ] Harleigh Civello C, PA-C       Patient presents with possible recurrent abdominal wall infection. Patient is nontoxic appearing, afebrile, not tachycardic, not tachypneic, not hypotensive, maintains excellent SPO2 on room air, and is in no apparent distress.  Doubt sepsis in this patient.     End of shift patient care handoff report given to Andee Poles, EM resident. Plan: Review CT. If signs of infection, admit for IV ABX and further management due to hx of DM with recurrent infection abdominal wall infections.   Labs and radiological studies were personally reviewed by me.  Findings  and plan of care discussed with Lajean Saver, MD. Dr. Ashok Cordia personally evaluated and examined this patient.  Vitals:   12/05/18 1140 12/05/18 1145 12/05/18 1215 12/05/18 1245  BP: (!) 185/82 (!) 156/64 (!) 124/53 (!) 174/77  Pulse: 81 79 79 78  Resp: 18     Temp:      TempSrc:      SpO2: 100% 100% 100% 100%     Final Clinical Impressions(s) / ED Diagnoses   Final diagnoses:  None    ED Discharge Orders    None       Layla Maw 12/05/18 1543    Lajean Saver, MD 12/06/18 [id number]

## 2018-12-05 NOTE — ED Notes (Signed)
Difficult iv x 2

## 2018-12-05 NOTE — ED Notes (Signed)
ED TO INPATIENT HANDOFF REPORT  ED Nurse Name and Phone #: [phone] Doreene Adas Name/Age/Gender [name] 68 y.o. female Room/Bed: 021C/021C  Code Status   Code Status: DNR  Home/SNF/Other Home Patient oriented to: self, place, time and situation Is this baseline? Yes   Triage Complete: Triage complete  Chief Complaint Abscess on stomach, UTI  Triage Note Pt states she has several chronic abscesses to abdomen. Pt has also been on antibiotics for 6 months for UTI. States PCP thinks there is no infection in abdomen. Pt states she is here for pain today.   Allergies Allergies  Allergen Reactions  . Propranolol Swelling  . Topamax [Topiramate]     hallucinations  . Latex Itching and Rash    Level of Care/Admitting Diagnosis ED Disposition    ED Disposition Condition [facility] Area: [place] [[id number]]  Level of Care: Med-Surg [16]  I expect the patient will be discharged within 24 hours: No (not a candidate for 5C-Observation unit)  Covid Evaluation: N/A  Diagnosis: Wound, open, anterior abdominal wall [196222]  Admitting Physician: Lenore Cordia [[id number]]  Attending Physician: Lenore Cordia [[id number]]  PT Class (Do Not Modify): Observation [104]  PT Acc Code (Do Not Modify): Observation [10022]       B Medical/Surgery History Past Medical History:  Diagnosis Date  . Atrial fibrillation ([place])   . CHF (congestive heart failure) ([place])   . Depression   . Diabetes mellitus without complication ([place])   . 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 Hypertension   . Morbid obesity ([place])   . Osteoporosis   . UTI (lower urinary tract infection) 01/2016   Cipro for Klebsiella pneumoniae isolate   Past Surgical History:  Procedure Laterality Date  . ABDOMINAL HYSTERECTOMY    . BIOPSY  09/06/2018   Procedure: BIOPSY;  Surgeon: Danie Binder, MD;  [facility];  Service:  Endoscopy;;  gastric bx's  . CESAREAN SECTION    . CHOLECYSTECTOMY    . ESOPHAGEAL DILATION  09/06/2018   Procedure: ESOPHAGEAL DILATION;  Surgeon: Danie Binder, MD;  [facility];  Service: Endoscopy;;  . ESOPHAGOGASTRODUODENOSCOPY (EGD) WITH PROPOFOL N/A 09/06/2018   Procedure: ESOPHAGOGASTRODUODENOSCOPY (EGD) WITH PROPOFOL;  Surgeon: Danie Binder, MD;  [facility];  Service: Endoscopy;  Laterality: N/A;  dilatation  . FEMUR IM NAIL Left 02/20/2016  . FEMUR IM NAIL Left 02/20/2016   Procedure: INTRAMEDULLARY (IM) RETROGRADE FEMORAL NAILING;  Surgeon: Leandrew Koyanagi, MD;  Location: [facility];  Service: Orthopedics;  Laterality: Left;  . PANCREAS SURGERY  1967   1 cyst excised and one cyst drained  . TEE WITHOUT CARDIOVERSION N/A 04/05/2018   Procedure: TRANSESOPHAGEAL ECHOCARDIOGRAM (TEE);  Surgeon: Dorothy Spark, MD;  Location: [place];  Service: Cardiovascular;  Laterality: N/A;  . [place]    . WRIST FRACTURE SURGERY       A IV Location/Drains/Wounds Patient Lines/Drains/Airways Status   Active Line/Drains/Airways    Name:   Placement date:   Placement time:   Site:   Days:   Peripheral IV 12/05/18 Left;Upper Arm   12/05/18    1409    Arm   less than 1          Intake/Output Last 24 hours No intake or output data in the 24 hours ending 12/05/18 1914  Labs/Imaging Results for orders placed or  performed during the hospital encounter of 12/05/18 (from the past 48 hour(s))  Comprehensive metabolic panel     Status: Abnormal   Collection Time: 12/05/18  1:18 PM  Result Value Ref Range   Sodium 137 135 - 145 mmol/L   Potassium 4.0 3.5 - 5.1 mmol/L   Chloride 104 98 - 111 mmol/L   CO2 23 22 - 32 mmol/L   Glucose, Bld 325 (H) 70 - 99 mg/dL   BUN 14 8 - 23 mg/dL   Creatinine, Ser 0.67 0.44 - 1.00 mg/dL   Calcium 8.6 (L) 8.9 - 10.3 mg/dL   Total Protein 6.2 (L) 6.5 - 8.1 g/dL   Albumin 2.7 (L) 3.5 - 5.0 g/dL   AST 10 (L) 15 - 41  U/L   ALT 8 0 - 44 U/L   Alkaline Phosphatase 89 38 - 126 U/L   Total Bilirubin 0.7 0.3 - 1.2 mg/dL   GFR calc non Af Amer >60 >60 mL/min   GFR calc Af Amer >60 >60 mL/min   Anion gap 10 5 - 15    Comment: Performed at [facility], [address], [place], [place] [address]  CBC with Differential     Status: Abnormal   Collection Time: 12/05/18  1:18 PM  Result Value Ref Range   WBC 8.0 4.0 - 10.5 K/uL   RBC 3.05 (L) 3.87 - 5.11 MIL/uL   Hemoglobin 7.7 (L) 12.0 - 15.0 g/dL   HCT 26.4 (L) 36.0 - 46.0 %   MCV 86.6 80.0 - 100.0 fL   MCH 25.2 (L) 26.0 - 34.0 pg   MCHC 29.2 (L) 30.0 - 36.0 g/dL   RDW 15.7 (H) 11.5 - 15.5 %   Platelets 316 150 - 400 K/uL   nRBC 0.0 0.0 - 0.2 %   Neutrophils Relative % 73 %   Neutro Abs 5.9 1.7 - 7.7 K/uL   Lymphocytes Relative 15 %   Lymphs Abs 1.2 0.7 - 4.0 K/uL   Monocytes Relative 7 %   Monocytes Absolute 0.6 0.1 - 1.0 K/uL   Eosinophils Relative 3 %   Eosinophils Absolute 0.2 0.0 - 0.5 K/uL   Basophils Relative 1 %   Basophils Absolute 0.1 0.0 - 0.1 K/uL   Immature Granulocytes 1 %   Abs Immature Granulocytes 0.04 0.00 - 0.07 K/uL    Comment: Performed at [facility], [address] [place], [place] [address]   Ct Abdomen Pelvis W Contrast  Result Date: 12/05/2018 CLINICAL DATA:  Pt states she has several chronic abscesses to abdomen. Pt has also been on antibiotics for 6 months for UTI. Today is having abd pain. EXAM: CT ABDOMEN AND PELVIS WITH CONTRAST TECHNIQUE: Multidetector CT imaging of the abdomen and pelvis was performed using the standard protocol following bolus administration of intravenous contrast. CONTRAST:  155m OMNIPAQUE IOHEXOL 300 MG/ML  SOLN COMPARISON:  09/03/2018 FINDINGS: Lower chest: Trace right pleural effusion. Hepatobiliary: No focal liver abnormality is seen. Status post cholecystectomy. Dilated common bile duct likely secondary to post cholecystectomy state. Pancreas: Severe pancreatic atrophy. Spleen:  Normal in size without focal abnormality. Adrenals/Urinary Tract: Adrenal glands are unremarkable. Kidneys are normal, without renal calculi, focal lesion, or hydronephrosis. Bladder is unremarkable. Stomach/Bowel: Stomach is within normal limits. Appendix appears normal. No evidence of bowel wall thickening, distention, or inflammatory changes. Diverticulosis without evidence of diverticulitis. Vascular/Lymphatic: Abdominal aortic atherosclerosis. Normal caliber abdominal aorta. No lymphadenopathy. Reproductive: Status post hysterectomy. No adnexal masses. Other: Small  fat containing umbilical hernia. No ascites. 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 Musculoskeletal: No acute osseous abnormality. No aggressive osseous lesion. Degenerative disc disease with disc height loss at L4-5 and L5-S1 with bilateral facet arthropathy. Grade 1 anterolisthesis of L4 on L5 secondary to facet disease. Severe bilateral foraminal stenosis at L4-5. IMPRESSION: 1. No acute abdominal or pelvic pathology. 2. Diverticulosis without evidence of diverticulitis. Electronically Signed   By: Kathreen Devoid   On: 12/05/2018 15:41    Pending Labs Unresulted Labs (From admission, onward)    Start     Ordered   12/06/18 0500  CBC  Tomorrow morning,   R     12/05/18 1905   12/06/18 2542  Basic metabolic panel  Tomorrow morning,   R     12/05/18 1905   12/05/18 1748  Wound or Superficial Culture  Once,   STAT     12/05/18 1748          Vitals/Pain Today's Vitals   12/05/18 1515 12/05/18 1645 12/05/18 1700 12/05/18 1745  BP:      Pulse: 81 80  78  Resp:      Temp:      TempSrc:      SpO2: 98% 99%  99%  PainSc:   5      Isolation Precautions No active isolations  Medications Medications  acetaminophen (TYLENOL) tablet 650 mg (has no administration in time range)    Or  acetaminophen (TYLENOL) suppository 650 mg (has no administration in time range)   oxyCODONE (Oxy IR/ROXICODONE) immediate release tablet 5 mg (has no administration in time range)  albuterol (VENTOLIN HFA) 108 (90 Base) MCG/ACT inhaler 2 puff (has no administration in time range)  apixaban (ELIQUIS) tablet 5 mg (has no administration in time range)  buPROPion (WELLBUTRIN XL) 24 hr tablet 300 mg (has no administration in time range)  diltiazem (CARDIZEM CD) 24 hr capsule 120 mg (has no administration in time range)  escitalopram (LEXAPRO) tablet 10 mg (has no administration in time range)  ferrous sulfate tablet 325 mg (has no administration in time range)  furosemide (LASIX) tablet 20 mg (has no administration in time range)  metoprolol tartrate (LOPRESSOR) tablet 25 mg (has no administration in time range)  pantoprazole (PROTONIX) EC tablet 40 mg (has no administration in time range)  Tiotropium Bromide Monohydrate AERS 2 spray (has no administration in time range)  iohexol (OMNIPAQUE) 300 MG/ML solution 100 mL (100 mLs Intravenous Contrast Given 12/05/18 1455)    Mobility manual wheelchair Low fall risk   Focused Assessments    R Recommendations: See Admitting Provider Note  Report given to:   Additional Notes:

## 2018-12-05 NOTE — Progress Notes (Signed)
Attempted to talk to RN for report.

## 2018-12-06 ENCOUNTER — Other Ambulatory Visit: Payer: Self-pay

## 2018-12-06 DIAGNOSIS — S31109A Unspecified open wound of abdominal wall, unspecified quadrant without penetration into peritoneal cavity, initial encounter: Secondary | ICD-10-CM | POA: Diagnosis not present

## 2018-12-06 DIAGNOSIS — Z993 Dependence on wheelchair: Secondary | ICD-10-CM | POA: Diagnosis not present

## 2018-12-06 DIAGNOSIS — M810 Age-related osteoporosis without current pathological fracture: Secondary | ICD-10-CM | POA: Diagnosis not present

## 2018-12-06 DIAGNOSIS — I4891 Unspecified atrial fibrillation: Secondary | ICD-10-CM | POA: Diagnosis not present

## 2018-12-06 DIAGNOSIS — I110 Hypertensive heart disease with heart failure: Secondary | ICD-10-CM | POA: Diagnosis not present

## 2018-12-06 DIAGNOSIS — E119 Type 2 diabetes mellitus without complications: Secondary | ICD-10-CM | POA: Diagnosis not present

## 2018-12-06 DIAGNOSIS — R54 Age-related physical debility: Secondary | ICD-10-CM | POA: Diagnosis not present

## 2018-12-06 DIAGNOSIS — E1165 Type 2 diabetes mellitus with hyperglycemia: Secondary | ICD-10-CM | POA: Diagnosis not present

## 2018-12-06 DIAGNOSIS — Z66 Do not resuscitate: Secondary | ICD-10-CM | POA: Diagnosis not present

## 2018-12-06 DIAGNOSIS — J449 Chronic obstructive pulmonary disease, unspecified: Secondary | ICD-10-CM | POA: Diagnosis not present

## 2018-12-06 DIAGNOSIS — D638 Anemia in other chronic diseases classified elsewhere: Secondary | ICD-10-CM | POA: Diagnosis not present

## 2018-12-06 DIAGNOSIS — Z6837 Body mass index (BMI) 37.0-37.9, adult: Secondary | ICD-10-CM | POA: Diagnosis not present

## 2018-12-06 DIAGNOSIS — L03311 Cellulitis of abdominal wall: Secondary | ICD-10-CM | POA: Diagnosis not present

## 2018-12-06 DIAGNOSIS — I482 Chronic atrial fibrillation, unspecified: Secondary | ICD-10-CM | POA: Diagnosis not present

## 2018-12-06 DIAGNOSIS — L98498 Non-pressure chronic ulcer of skin of other sites with other specified severity: Secondary | ICD-10-CM | POA: Diagnosis not present

## 2018-12-06 DIAGNOSIS — F329 Major depressive disorder, single episode, unspecified: Secondary | ICD-10-CM | POA: Diagnosis not present

## 2018-12-06 DIAGNOSIS — I5032 Chronic diastolic (congestive) heart failure: Secondary | ICD-10-CM | POA: Diagnosis not present

## 2018-12-06 DIAGNOSIS — F419 Anxiety disorder, unspecified: Secondary | ICD-10-CM | POA: Diagnosis not present

## 2018-12-06 LAB — GLUCOSE, CAPILLARY
Glucose-Capillary: 174 mg/dL — ABNORMAL HIGH (ref 70–99)
Glucose-Capillary: 213 mg/dL — ABNORMAL HIGH (ref 70–99)
Glucose-Capillary: 89 mg/dL (ref 70–99)
Glucose-Capillary: 229 mg/dL — ABNORMAL HIGH (ref 70–99)

## 2018-12-06 LAB — BASIC METABOLIC PANEL
Anion gap: 9 (ref 5–15)
BUN: 11 mg/dL (ref 8–23)
CO2: 26 mmol/L (ref 22–32)
Calcium: 8.6 mg/dL — ABNORMAL LOW (ref 8.9–10.3)
Chloride: 102 mmol/L (ref 98–111)
Creatinine, Ser: 0.65 mg/dL (ref 0.44–1.00)
GFR calc Af Amer: >60 mL/min (ref >60)
GFR calc non Af Amer: >60 mL/min (ref >60)
Glucose, Bld: 189 mg/dL — ABNORMAL HIGH (ref 70–99)
Potassium: 3.9 mmol/L (ref 3.5–5.1)
Sodium: 137 mmol/L (ref 135–145)

## 2018-12-06 LAB — CBC
HCT: 25.4 % — ABNORMAL LOW (ref 36.0–46.0)
Hemoglobin: 7.7 g/dL — ABNORMAL LOW (ref 12.0–15.0)
MCH: 26 pg (ref 26.0–34.0)
MCHC: 30.3 g/dL (ref 30.0–36.0)
MCV: 85.8 fL (ref 80.0–100.0)
Platelets: 322 10*3/uL (ref 150–400)
RBC: 2.96 MIL/uL — ABNORMAL LOW (ref 3.87–5.11)
RDW: 15.8 % — ABNORMAL HIGH (ref 11.5–15.5)
WBC: 7.5 10*3/uL (ref 4.0–10.5)
nRBC: 0 % (ref 0.0–0.2)

## 2018-12-06 MED ORDER — FUROSEMIDE 40 MG PO TABS
40.0000 mg | ORAL_TABLET | Freq: Every day | ORAL | Status: DC
  Administered 2018-12-06 – 2018-12-07 (×2): 40 mg via ORAL
  Filled 2018-12-06 (×2): qty 1

## 2018-12-06 MED ORDER — SODIUM CHLORIDE 0.9 % IV SOLN
1.0000 g | INTRAVENOUS | Status: DC
  Administered 2018-12-06 – 2018-12-07 (×2): 1 g via INTRAVENOUS
  Filled 2018-12-06 (×2): qty 10

## 2018-12-06 MED ORDER — BOOST / RESOURCE BREEZE PO LIQD CUSTOM
1.0000 | Freq: Three times a day (TID) | ORAL | Status: DC
  Administered 2018-12-06 – 2018-12-07 (×3): 1 via ORAL

## 2018-12-06 MED ORDER — FUROSEMIDE 10 MG/ML IJ SOLN
20.0000 mg | Freq: Once | INTRAMUSCULAR | Status: AC
  Administered 2018-12-06: 20 mg via INTRAVENOUS
  Filled 2018-12-06: qty 2

## 2018-12-06 NOTE — Consult Note (Addendum)
[facility] Nurse wound consult note Reason for Consult: Consult requested for abd wound.  Pt had surgery approx 18 years ago and states periodically the location will blister and open as a wound.  Wound type: Right abd with 2 full thickness wounds, separated by a narrow strip of skin.  2X5X.1cm and 3X5X.1cm, and entire affected area is 5X9X.1cm; red and slightly raised above skin level, oval shaped. Both wounds are yellow and moist, mod amt think tan pus, no odor, hard and tender to touch. Dressing procedure/placement/frequency: Pt is on systemic antibiotics for cellulitis.  Aquacel to provide antimicrobial benefits and absorb drainage. Foam dressing to protect from further injury.  Please re-consult if further assistance is needed.  Thank-you,  Julien Girt MSN, [place], [place], [place], [place]

## 2018-12-06 NOTE — Consult Note (Signed)
   [facility] CM Inpatient Consult   12/06/2018  [name] [birth year] [id number]    Referral received from ED transition of care CM Wendi Maya) through [facility] regarding patient having 3 ED visits in the past 6 months. Noted 4 hospital admits in the last 6 month period. Patient has extreme (39%) unplanned readmission risk score and has [employer].  Chart review and history & physical 12/05/18 show as follows: [name] is a 68 y.o. female with medical history significant for chronic diastolic CHF, atrial fibrillation on Eliquis, history of LV thrombus, COPD, type 2 diabetes, hypertension, anemia, depression, and recurrent UTIs who presents to the ED with an abdominal wall wound with possible acute on chronic abdominal cellulitis. Of note patient has been treated with multiple rounds of antibiotics for apparently recurrent UTI over the last 6 months with medications including Keflex, ciprofloxacin, and fosfomycin last urine culture on file 10/29/2018 showed ESBL Klebsiella pneumonia.    Primary care provider is Dr. [name] with [facility] at [facility], listed as providing transition of care follow-up.  Will follow for progress and disposition as appropriate for post hospital follow-up, if needed.  Of note, [facility] Care Management services does not replace or interfere with any services that are arranged by transition of care case management or social work.     For questions or if there any disposition needs that may arise, please call:  Edwena Felty A. Hanaan Gancarz, BSN, RN-BC [facility] Liaison Cell: [phone]

## 2018-12-06 NOTE — Evaluation (Signed)
Physical Therapy Evaluation Patient Details Name: [name] MRN: [id number] DOB: April 06, 1951 Today's Date: 12/06/2018   History of Present Illness  patient is a 68 y.o. Female with PMH significant for chronic diastolic CHF, COPD, type 2 diabetes, hypertension, anemia, depression, and recurrent UTIs ( also ESBL in the past)  who presents to the ED with dysuria and a shallow abdominal wall wound with possible acute on chronic abd cellulitis.   Clinical Impression  Orders received for PT evaluation. Patient demonstrates deficits in functional mobility as indicated below. Will benefit from continued skilled PT to address deficits and maximize function. Will see as indicated and progress as tolerated.  Anticipate patient will be safe to return home with family/caregiver assist and resumption of home health services.    Follow Up Recommendations Home health PT;Supervision for mobility/OOB    Equipment Recommendations  None recommended by PT    Recommendations for Other Services       Precautions / Restrictions Precautions Precautions: Fall      Mobility  Bed Mobility Overal bed mobility: Needs Assistance Bed Mobility: Rolling;Sit to Supine;Supine to Sit Rolling: Supervision   Supine to sit: Min guard Sit to supine: Min guard   General bed mobility comments: increased time and effort, no physical assist required, reliance on bed rail for UE support to pull to side and push to upright  Transfers Overall transfer level: Needs assistance   Transfers: Sit to/from Stand;Lateral/Scoot Transfers(lateral scoot to [facility]) Sit to Stand: Min assist        Lateral/Scoot Transfers: Min guard General transfer comment: patient able to elevate trunk to semi upright and then perform lateral scoot from base of bed to [facility]. did not attempt OOB transfer today  Ambulation/Gait                Stairs            Wheelchair Mobility    Modified Rankin (Stroke Patients Only)        Balance Overall balance assessment: History of Falls                                           Pertinent Vitals/Pain Pain Assessment: Faces Faces Pain Scale: Hurts little more Pain Location: generalized pain in LEs and abdomen Pain Intervention(s): Monitored during session    Home Living Family/patient expects to be discharged to:: Private residence Living Arrangements: Spouse/significant other Available Help at Discharge: Family;Available PRN/intermittently Type of Home: House Home Access: Ramped entrance     Home Layout: One level Home Equipment: Walker - standard;Bedside commode;Grab bars - tub/shower;Shower seat;Wheelchair - manual;Tub bench;Walker - 2 wheels      Prior Function Level of Independence: Needs assistance   Gait / Transfers Assistance Needed: has been working with [facility] on bed mobility, transfers to w/c with and without RW as transfer device  ADL's / Homemaking Assistance Needed: assisted by spouse        Hand Dominance   Dominant Hand: Right    Extremity/Trunk Assessment   Upper Extremity Assessment Upper Extremity Assessment: Generalized weakness    Lower Extremity Assessment Lower Extremity Assessment: Generalized weakness(hx of bilateral ankle fx from fall)    Cervical / Trunk Assessment Cervical / Trunk Assessment: (increased body habitus)  Communication   Communication: No difficulties  Cognition Arousal/Alertness: Awake/alert Behavior During Therapy: WFL for tasks assessed/performed Overall Cognitive Status: Within Functional Limits for  tasks assessed                                        General Comments      Exercises     Assessment/Plan    PT Assessment Patient needs continued PT services  PT Problem List Decreased strength;Decreased activity tolerance;Decreased mobility;Decreased balance;Decreased safety awareness       PT Treatment Interventions DME instruction;Gait training;Stair  training;Functional mobility training;Therapeutic activities;Therapeutic exercise;Balance training;Patient/family education    PT Goals (Current goals can be found in the Care Plan section)  Acute Rehab PT Goals Patient Stated Goal: to go home PT Goal Formulation: With patient Time For Goal Achievement: 12/20/18 Potential to Achieve Goals: Good    Frequency Min 3X/week   Barriers to discharge        Co-evaluation               AM-PAC PT "6 Clicks" Mobility  Outcome Measure Help needed turning from your back to your side while in a flat bed without using bedrails?: None Help needed moving from lying on your back to sitting on the side of a flat bed without using bedrails?: A Little Help needed moving to and from a bed to a chair (including a wheelchair)?: A Little Help needed standing up from a chair using your arms (e.g., wheelchair or bedside chair)?: A Little Help needed to walk in hospital room?: A Lot Help needed climbing 3-5 steps with a railing? : Total 6 Click Score: 16    End of Session   Activity Tolerance: Patient tolerated treatment well Patient left: in bed;with call bell/phone within reach;with bed alarm set Nurse Communication: Mobility status PT Visit Diagnosis: Difficulty in walking, not elsewhere classified (R26.2);Muscle weakness (generalized) (M62.81)    Time: 3545-6256 PT Time Calculation (min) (ACUTE ONLY): 22 min   Charges:   PT Evaluation $PT Eval Moderate Complexity: 1 Mod          Alben Deeds, PT DPT  Board Certified Neurologic Specialist Acute Rehabilitation Services Pager [phone] Office [place] 12/06/2018, 4:37 PM

## 2018-12-06 NOTE — Progress Notes (Signed)
Initial Nutrition Assessment   RD working remotely  [place]:   Obesity unspecified  INTERVENTION:    Boost Breeze po TID, each supplement provides 250 kcal and 9 grams of protein  NUTRITION DIAGNOSIS:   Increased nutrient needs related to acute illness, wound healing as evidenced by estimated needs  GOAL:   Patient will meet greater than or equal to 90% of their needs  MONITOR:   PO intake, Supplement acceptance, Labs, Skin, I & O's  REASON FOR ASSESSMENT:   Malnutrition Screening Tool  ASSESSMENT:   68 y.o. Female with PMH significant for chronic diastolic CHF, COPD, type 2 diabetes, hypertension, anemia, depression, and recurrent UTIs ( also ESBL in the past)  who presents to the ED with dysuria and a shallow abdominal wall wound with possible acute on chronic abd cellulitis.   Admit dx include: Abscess on stomach, UTI  RD spoke with pt over phone. She reports her appetite is "lousy". States "I think all the ABX are messing my taste buds".  Shares she had half a pancake and a lemon icy for breakfast. Typically consumes 2 meals per day at home with snacks. Does not usually drink any nutrition supplements.  Doesn't like milky drinks but amenable to trying Boost Breeze. Pt's weight has fluctuated; lost some weight while she was at "Rehab". She feels her weight however is trending back up now.  Labs & medications reviewed.  CWOCN note reviewed. CBG's [id number].  NUTRITION - FOCUSED PHYSICAL EXAM:  Unable to assess at this time  Diet Order:   Diet Order            Diet heart healthy/carb modified Room service appropriate? Yes; Fluid consistency: Thin  Diet effective now             EDUCATION NEEDS:   Not appropriate for education at this time  Skin:  Skin Assessment: Skin Integrity Issues: Skin Integrity Issues:: Other (Comment) Other: Right abd with 2 full thickness wounds  Last BM:  4/19  Height:   Ht Readings from Last 1  Encounters:  12/06/18 5' 3"  (1.6 m)   Weight:   Wt Readings from Last 1 Encounters:  12/06/18 96.9 kg   BMI:  Body mass index is 37.84 kg/m.  Estimated Nutritional Needs:   Kcal:  2000-2200  Protein:  110-125 gm  Fluid:  2.0-2.2 L  Arthur Holms, RD, LDN Pager #: [phone] After-Hours Pager #: [phone]

## 2018-12-06 NOTE — Progress Notes (Signed)
Patient's BP 141/49. This RN notified the MD and was advised to hold the metoprolol for tonight. Will continue to monitor.

## 2018-12-06 NOTE — Progress Notes (Signed)
PROGRESS NOTE                                                                                                                                                                                                             Patient Demographics:    [name], is a 68 y.o. female, DOB - 11-04-1950, ERX:[id number]  Admit date - 12/05/2018   Admitting Physician Lenore Cordia, MD  Outpatient Primary MD for the patient is [name], MD  LOS - 0  Chief Complaint  Patient presents with   Abscess       Brief Narrative  [name] is a 68 y.o. female with medical history significant for chronic diastolic CHF, atrial fibrillation on Eliquis, History of LV thrombus on Eliquis, COPD, type 2 diabetes, hypertension, anemia, depression, and recurrent UTIs ( also ESBL in the past)  who presents to the ED with dysuria and a shallow abdominal wall wound with possible acute on chronic abd cellulitis.   Subjective:    [name] today has, No headache, No chest pain, No abdominal pain - No Nausea, No new weakness tingling or numbness, No Cough - SOB.  +ve dysuria.   Assessment  & Plan :     1. Abd wall acute on chronic cellulitis - shallow stage 2 Ulcer +/- UTI -no sepsis, appears nontoxic and stable, no URI symptoms, will order UA with urine culture drawn her dysuria symptoms and past history of UTI, for now continue vancomycin, will add Rocephin.  Follow urine cultures and clinically.  Note in the past she has had history of ESBL which could be colonization as well.  Monitor clinical response to above medications.  If she clinically responds might be prudent to switch her to doxycycline plus minus Keflex if she has UTI and send her home in 1 to 2 days.  Wound care to evaluate as well.  2.  Morbid obesity.  Follow with PCP for weight loss.  3.  Chronic deconditioning and wheelchair bound status.  PT eval.  Lives at home with husband.  4.  Chronic atrial fibrillation [place]  vas 2 score of at least 4.  History of LV thrombus.  Continue combination of diltiazem, metoprolol and Eliquis.  5.  Anemia of chronic disease.  Hemoglobin stable at 7.7.  Monitor.  Does not provide any history suggestive of GI blood loss.  Outpatient work-up by PCP.  6.  Chronic diastolic CHF EF 95% on echocardiogram done in January 2020.  Continue  home dose Lasix.  Currently compensated.  7.  History of COPD.  No acute issue.  8.  Anxiety and depression.  Continue combination of Lexapro and Wellbutrin.  9.  DM type II.  On glipizide at home.  Currently on sliding scale will monitor.  Low carbohydrate diet.  Lab Results  Component Value Date   HGBA1C 7.7 (H) 12/05/2018   CBG (last 3)  Recent Labs    12/05/18 2223 12/06/18 0802  GLUCAP 183* 174*      Family Communication  :  None  Code Status :  Full  Disposition Plan  :  Home in 1-2 days  Consults  :  None  Procedures  :    CT A&P with contrast - 1. No acute abdominal or pelvic pathology. 2. Diverticulosis without evidence of diverticulitis.  DVT Prophylaxis  :  Eliquis  Lab Results  Component Value Date   PLT 322 12/06/2018    Diet :  Diet Order            Diet heart healthy/carb modified Room service appropriate? Yes; Fluid consistency: Thin  Diet effective now               Inpatient Medications Scheduled Meds:  apixaban  5 mg Oral BID   diltiazem  120 mg Oral Daily   escitalopram  5 mg Oral Daily   ferrous sulfate  325 mg Oral Q breakfast   furosemide  20 mg Oral Daily   insulin aspart  0-15 Units Subcutaneous TID WC   insulin aspart  0-5 Units Subcutaneous QHS   metoprolol tartrate  25 mg Oral BID   Continuous Infusions:  cefTRIAXone (ROCEPHIN)  IV     vancomycin     PRN Meds:.acetaminophen **OR** acetaminophen, albuterol, oxyCODONE, pantoprazole  Antibiotics  :   Anti-infectives (From admission, onward)   Start     Dose/Rate Route Frequency Ordered Stop   12/06/18 2200   vancomycin (VANCOCIN) 1,250 mg in sodium chloride 0.9 % 250 mL IVPB     1,250 mg 166.7 mL/hr over 90 Minutes Intravenous Every 24 hours 12/05/18 2305     12/06/18 0800  cefTRIAXone (ROCEPHIN) 1 g in sodium chloride 0.9 % 100 mL IVPB     1 g 200 mL/hr over 30 Minutes Intravenous Every 24 hours 12/06/18 0756 12/11/18 0759   12/05/18 2000  vancomycin (VANCOCIN) 1,750 mg in sodium chloride 0.9 % 500 mL IVPB     1,750 mg 250 mL/hr over 120 Minutes Intravenous  Once 12/05/18 1931 12/06/18 0200          Objective:   Vitals:   12/06/18 0318 12/06/18 0322 12/06/18 0614 12/06/18 0615  BP:    (!) 155/47  Pulse:    73  Resp:    14  Temp:    98 F (36.7 C)  TempSrc: Oral   Oral  SpO2:    95%  Weight:  98.1 kg 96.9 kg   Height:  5' 3"  (1.6 m)      Wt Readings from Last 3 Encounters:  12/06/18 96.9 kg  10/29/18 95.3 kg  10/22/18 94.8 kg    No intake or output data in the 24 hours ending 12/06/18 0759   Physical Exam  Awake Alert, Oriented X 3, No new F.N deficits, Normal affect [place].AT,PERRAL Supple Neck,No JVD, No cervical lymphadenopathy appriciated.  Symmetrical Chest wall movement, Good air movement bilaterally, CTAB RRR,No Gallops,Rubs or new Murmurs, No Parasternal Heave +ve B.Sounds, Abd Soft, No tenderness,  No organomegaly appriciated, No rebound - guarding or rigidity. No Cyanosis, large pannus with chronic discoloration, small shallow stage 2 ulcer on the right side of the pannus, minimal warmth       Data Review:    CBC Recent Labs  Lab 12/05/18 1318 12/06/18 0329  WBC 8.0 7.5  HGB 7.7* 7.7*  HCT 26.4* 25.4*  PLT 316 322  MCV 86.6 85.8  MCH 25.2* 26.0  MCHC 29.2* 30.3  RDW 15.7* 15.8*  LYMPHSABS 1.2  --   MONOABS 0.6  --   EOSABS 0.2  --   BASOSABS 0.1  --     Chemistries  Recent Labs  Lab 12/05/18 1318 12/06/18 0329  NA 137 137  K 4.0 3.9  CL 104 102  CO2 23 26  GLUCOSE 325* 189*  BUN 14 11  CREATININE 0.67 0.65  CALCIUM 8.6* 8.6*  AST  10*  --   ALT 8  --   ALKPHOS 89  --   BILITOT 0.7  --    ------------------------------------------------------------------------------------------------------------------ No results for input(s): CHOL, HDL, LDLCALC, TRIG, CHOLHDL, LDLDIRECT in the last 72 hours.  Lab Results  Component Value Date   HGBA1C 7.7 (H) 12/05/2018   ------------------------------------------------------------------------------------------------------------------ No results for input(s): TSH, T4TOTAL, T3FREE, THYROIDAB in the last 72 hours.  Invalid input(s): FREET3 ------------------------------------------------------------------------------------------------------------------ No results for input(s): VITAMINB12, FOLATE, FERRITIN, TIBC, IRON, RETICCTPCT in the last 72 hours.  Coagulation profile No results for input(s): INR, PROTIME in the last 168 hours.  No results for input(s): DDIMER in the last 72 hours.  Cardiac Enzymes No results for input(s): CKMB, TROPONINI, MYOGLOBIN in the last 168 hours.  Invalid input(s): CK ------------------------------------------------------------------------------------------------------------------    Component Value Date/Time   BNP 464.0 (H) 08/16/2018 1848    Micro Results No results found for this or any previous visit (from the past 240 hour(s)).  Radiology Reports Ct Abdomen Pelvis W Contrast  Result Date: 12/05/2018 CLINICAL DATA:  Pt states she has several chronic abscesses to abdomen. Pt has also been on antibiotics for 6 months for UTI. Today is having abd pain. EXAM: CT ABDOMEN AND PELVIS WITH CONTRAST TECHNIQUE: Multidetector CT imaging of the abdomen and pelvis was performed using the standard protocol following bolus administration of intravenous contrast. CONTRAST:  117m OMNIPAQUE IOHEXOL 300 MG/ML  SOLN COMPARISON:  09/03/2018 FINDINGS: Lower chest: Trace right pleural effusion. Hepatobiliary: No focal liver abnormality is seen. Status post  cholecystectomy. Dilated common bile duct likely secondary to post cholecystectomy state. Pancreas: Severe pancreatic atrophy. Spleen: Normal in size without focal abnormality. Adrenals/Urinary Tract: Adrenal glands are unremarkable. Kidneys are normal, without renal calculi, focal lesion, or hydronephrosis. Bladder is unremarkable. Stomach/Bowel: Stomach is within normal limits. Appendix appears normal. No evidence of bowel wall thickening, distention, or inflammatory changes. Diverticulosis without evidence of diverticulitis. Vascular/Lymphatic: Abdominal aortic atherosclerosis. Normal caliber abdominal aorta. No lymphadenopathy. Reproductive: Status post hysterectomy. No adnexal masses. Other: Small fat containing umbilical hernia. No ascites. 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 Musculoskeletal: No acute osseous abnormality. No aggressive osseous lesion. Degenerative disc disease with disc height loss at L4-5 and L5-S1 with bilateral facet arthropathy. Grade 1 anterolisthesis of L4 on L5 secondary to facet disease. Severe bilateral foraminal stenosis at L4-5. IMPRESSION: 1. No acute abdominal or pelvic pathology. 2. Diverticulosis without evidence of diverticulitis. Electronically Signed   By: HKathreen Devoid  On: 12/05/2018 15:41    Time Spent in minutes  [place] M.D on 12/06/2018 at 7:59 AM  To page go to [email or web address] - password [facility]

## 2018-12-07 DIAGNOSIS — R54 Age-related physical debility: Secondary | ICD-10-CM | POA: Diagnosis not present

## 2018-12-07 DIAGNOSIS — Z6837 Body mass index (BMI) 37.0-37.9, adult: Secondary | ICD-10-CM | POA: Diagnosis not present

## 2018-12-07 DIAGNOSIS — S31109A Unspecified open wound of abdominal wall, unspecified quadrant without penetration into peritoneal cavity, initial encounter: Secondary | ICD-10-CM | POA: Diagnosis not present

## 2018-12-07 DIAGNOSIS — J449 Chronic obstructive pulmonary disease, unspecified: Secondary | ICD-10-CM | POA: Diagnosis not present

## 2018-12-07 DIAGNOSIS — D638 Anemia in other chronic diseases classified elsewhere: Secondary | ICD-10-CM | POA: Diagnosis not present

## 2018-12-07 DIAGNOSIS — I10 Essential (primary) hypertension: Secondary | ICD-10-CM | POA: Diagnosis not present

## 2018-12-07 DIAGNOSIS — I482 Chronic atrial fibrillation, unspecified: Secondary | ICD-10-CM | POA: Diagnosis not present

## 2018-12-07 DIAGNOSIS — L03311 Cellulitis of abdominal wall: Secondary | ICD-10-CM | POA: Diagnosis not present

## 2018-12-07 DIAGNOSIS — L98498 Non-pressure chronic ulcer of skin of other sites with other specified severity: Secondary | ICD-10-CM | POA: Diagnosis not present

## 2018-12-07 DIAGNOSIS — I5032 Chronic diastolic (congestive) heart failure: Secondary | ICD-10-CM | POA: Diagnosis not present

## 2018-12-07 DIAGNOSIS — F329 Major depressive disorder, single episode, unspecified: Secondary | ICD-10-CM | POA: Diagnosis not present

## 2018-12-07 DIAGNOSIS — I110 Hypertensive heart disease with heart failure: Secondary | ICD-10-CM | POA: Diagnosis not present

## 2018-12-07 DIAGNOSIS — Z993 Dependence on wheelchair: Secondary | ICD-10-CM | POA: Diagnosis not present

## 2018-12-07 DIAGNOSIS — E119 Type 2 diabetes mellitus without complications: Secondary | ICD-10-CM | POA: Diagnosis not present

## 2018-12-07 DIAGNOSIS — Z66 Do not resuscitate: Secondary | ICD-10-CM | POA: Diagnosis not present

## 2018-12-07 DIAGNOSIS — F419 Anxiety disorder, unspecified: Secondary | ICD-10-CM | POA: Diagnosis not present

## 2018-12-07 DIAGNOSIS — M810 Age-related osteoporosis without current pathological fracture: Secondary | ICD-10-CM | POA: Diagnosis not present

## 2018-12-07 LAB — CBC
HCT: 28.2 % — ABNORMAL LOW (ref 36.0–46.0)
Hemoglobin: 8.3 g/dL — ABNORMAL LOW (ref 12.0–15.0)
MCH: 25.2 pg — ABNORMAL LOW (ref 26.0–34.0)
MCHC: 29.4 g/dL — ABNORMAL LOW (ref 30.0–36.0)
MCV: 85.7 fL (ref 80.0–100.0)
Platelets: 380 10*3/uL (ref 150–400)
RBC: 3.29 MIL/uL — ABNORMAL LOW (ref 3.87–5.11)
RDW: 15.8 % — ABNORMAL HIGH (ref 11.5–15.5)
WBC: 8.2 10*3/uL (ref 4.0–10.5)
nRBC: 0 % (ref 0.0–0.2)

## 2018-12-07 LAB — BASIC METABOLIC PANEL
Anion gap: 13 (ref 5–15)
BUN: 10 mg/dL (ref 8–23)
CO2: 26 mmol/L (ref 22–32)
Calcium: 8.8 mg/dL — ABNORMAL LOW (ref 8.9–10.3)
Chloride: 101 mmol/L (ref 98–111)
Creatinine, Ser: 0.73 mg/dL (ref 0.44–1.00)
GFR calc Af Amer: >60 mL/min (ref >60)
GFR calc non Af Amer: >60 mL/min (ref >60)
Glucose, Bld: 146 mg/dL — ABNORMAL HIGH (ref 70–99)
Potassium: 3.4 mmol/L — ABNORMAL LOW (ref 3.5–5.1)
Sodium: 140 mmol/L (ref 135–145)

## 2018-12-07 LAB — URINALYSIS, ROUTINE W REFLEX MICROSCOPIC
Bilirubin Urine: NEGATIVE
Glucose, UA: NEGATIVE mg/dL
Ketones, ur: 5 mg/dL — AB
Nitrite: NEGATIVE
Protein, ur: 30 mg/dL — AB
Specific Gravity, Urine: 1.019 (ref 1.005–1.030)
pH: 5 (ref 5.0–8.0)

## 2018-12-07 LAB — GLUCOSE, CAPILLARY
Glucose-Capillary: 170 mg/dL — ABNORMAL HIGH (ref 70–99)
Glucose-Capillary: 145 mg/dL — ABNORMAL HIGH (ref 70–99)

## 2018-12-07 MED ORDER — DOXYCYCLINE HYCLATE 100 MG PO TABS: 100.0000 mg | ORAL_TABLET | Freq: Two times a day (BID) | ORAL | 0 refills | Status: DC

## 2018-12-07 MED ORDER — POTASSIUM CHLORIDE CRYS ER 20 MEQ PO TBCR
40.0000 meq | EXTENDED_RELEASE_TABLET | Freq: Once | ORAL | Status: AC
  Administered 2018-12-07: 40 meq via ORAL
  Filled 2018-12-07: qty 2

## 2018-12-07 NOTE — Progress Notes (Signed)
Patient's O2 sat was 86% when the nurse tech checked it. After rechecking, it was in the high 90s. Monitoring closely.

## 2018-12-07 NOTE — Progress Notes (Signed)
Physical Therapy Treatment Patient Details Name: [name] MRN: [id number] DOB: [birth year] Today's Date: 12/07/2018    History of Present Illness patient is a 68 y.o. Female with PMH significant for chronic diastolic CHF, COPD, type 2 diabetes, hypertension, anemia, depression, and recurrent UTIs ( also ESBL in the past)  who presents to the ED with dysuria and a shallow abdominal wall wound with possible acute on chronic abd cellulitis.     PT Comments    Patient for mobility progression. Pt tolerated OOB mobility well and requires min A for functional transfers without use of AD. Current plan remains appropriate.    Follow Up Recommendations  Home health PT;Supervision for mobility/OOB     Equipment Recommendations  None recommended by PT    Recommendations for Other Services       Precautions / Restrictions Precautions Precautions: Fall    Mobility  Bed Mobility Overal bed mobility: Needs Assistance Bed Mobility: Supine to Sit     Supine to sit: Supervision     General bed mobility comments: use of rail and increased time and effort needed; supervision for safety; pt reports having bed rails at home  Transfers Overall transfer level: Needs assistance Equipment used: 1 person hand held assist Transfers: Sit to/from Stand;Stand Pivot Transfers Sit to Stand: Min assist Stand pivot transfers: Min assist       General transfer comment: assist for balance  Ambulation/Gait                 Stairs             Wheelchair Mobility    Modified Rankin (Stroke Patients Only)       Balance Overall balance assessment: History of Falls                                          Cognition Arousal/Alertness: Awake/alert Behavior During Therapy: WFL for tasks assessed/performed Overall Cognitive Status: Within Functional Limits for tasks assessed                                        Exercises      General  Comments        Pertinent Vitals/Pain Pain Assessment: Faces Faces Pain Scale: Hurts little more Pain Location: abdomen Pain Descriptors / Indicators: Sore;Guarding Pain Intervention(s): Limited activity within patient's tolerance;Monitored during session;Repositioned    Home Living                      Prior Function            PT Goals (current goals can now be found in the care plan section) Acute Rehab PT Goals Patient Stated Goal: to go home Progress towards PT goals: Progressing toward goals    Frequency    Min 3X/week      PT Plan Current plan remains appropriate    Co-evaluation              AM-PAC PT "6 Clicks" Mobility   Outcome Measure  Help needed turning from your back to your side while in a flat bed without using bedrails?: None Help needed moving from lying on your back to sitting on the side of a flat bed without using bedrails?: A Little Help needed moving to and  from a bed to a chair (including a wheelchair)?: A Little Help needed standing up from a chair using your arms (e.g., wheelchair or bedside chair)?: A Little Help needed to walk in hospital room?: A Lot Help needed climbing 3-5 steps with a railing? : Total 6 Click Score: 16    End of Session   Activity Tolerance: Patient tolerated treatment well Patient left: with call bell/phone within reach;in chair Nurse Communication: Mobility status PT Visit Diagnosis: Difficulty in walking, not elsewhere classified (R26.2);Muscle weakness (generalized) (M62.81)     Time: 3494-9447 PT Time Calculation (min) (ACUTE ONLY): 23 min  Charges:  $Gait Training: 8-22 mins $Therapeutic Activity: 8-22 mins                     Earney Navy, PTA Acute Rehabilitation Services Pager: [phone] Office: [phone]     Darliss Cheney 12/07/2018, 3:05 PM

## 2018-12-07 NOTE — TOC Initial Note (Signed)
Transition of Care [facility]) - Initial/Assessment Note    Patient Details  Name: [name] MRN: [id number] Date of Birth: Jul 08, 1951  Transition of Care [facility]) CM/SW Contact:    Sherrilyn Rist Phone Number: [phone] 12/07/2018, 9:58 AM  Clinical Narrative:                 CM talked to patient concerning discharge planning; patient lives at home with spouse; [name], MD; has private insurance with BCBS with prescription drug coverage; pharmacy of choice is [facility] in [place]; spouse takes her to all of her apt/ errands; DME - wheelchair, elevated commode seat, rolling walker; she is active with [facility] [place] as prior to admission.Butch Penny with [facility] called for resumption of services, [facility] for wound care. CM will continue to follow for progression of care.  Expected Discharge Plan: [place] Barriers to Discharge: No Barriers Identified   Patient Goals and CMS Choice Patient states their goals for this hospitalization and ongoing recovery are:: to stay at home CMS Medicare.gov Compare Post Acute Care list provided to:: Patient Choice offered to / list presented to : Patient  Expected Discharge Plan and Services Expected Discharge Plan: [place] In-house Referral: [facility] Discharge Planning Services: CM Consult Post Acute Care Choice: [place] arrangements for the past 2 months: Single Family Home                 DME Arranged: N/A DME Agency: NA HH Arranged: RN, Disease Management, PT [place] Agency: [place] [facility]  Prior Living Arrangements/Services Living arrangements for the past 2 months: Single Family Home Lives with:: Spouse Patient language and need for interpreter reviewed:: No Do you feel safe going back to the place where you live?: Yes      Need for Family Participation in Patient Care: Yes (Comment)(supportive spouse) Care giver support system in place?: Yes  (comment) Current home services: Home RN, Home PT Criminal Activity/Legal Involvement Pertinent to Current Situation/Hospitalization: No - Comment as needed  Activities of Daily Living Home Assistive Devices/Equipment: Wheelchair ADL Screening (condition at time of admission) Patient's cognitive ability adequate to safely complete daily activities?: Yes Is the patient deaf or have difficulty hearing?: No Does the patient have difficulty seeing, even when wearing glasses/contacts?: No Does the patient have difficulty concentrating, remembering, or making decisions?: No Patient able to express need for assistance with ADLs?: Yes Does the patient have difficulty dressing or bathing?: No Independently performs ADLs?: Yes (appropriate for developmental age) Does the patient have difficulty walking or climbing stairs?: Yes Weakness of Legs: Both Weakness of Arms/Hands: Left(More on the left then the right )  Permission Sought/Granted Permission sought to share information with : Case Manager Permission granted to share information with : Yes, Verbal Permission Granted  Share Information with NAME: Spouse  John  Permission granted to share info w AGENCY: [place] agency        Emotional Assessment Appearance:: Developmentally appropriate Attitude/Demeanor/Rapport: Gracious Affect (typically observed): Accepting Orientation: : Oriented to Self, Oriented to  Time, Oriented to Place, Oriented to Situation Alcohol / Substance Use: Not Applicable Psych Involvement: No (comment)  Admission diagnosis:  Abscess on stomach, UTI Patient Active Problem List   Diagnosis Date Noted  . Wound, open, anterior abdominal wall 12/05/2018  . Hypotension due to hypovolemia   . Gastritis due to nonsteroidal anti-inflammatory drug   . Esophageal dysphagia   . GI bleed 09/03/2018  . Coagulopathy ([place])   .  Colitis   . Lower abdominal pain   . Rectal bleeding   . Dehydration 08/16/2018  . Acute on chronic  diastolic CHF (congestive heart failure) ([place]) 08/10/2018  . Acute lower UTI 08/10/2018  . Hypokalemia 08/10/2018  . COPD (chronic obstructive pulmonary disease) ([place]) 08/10/2018  . Thyroid lesion 08/10/2018  . Closed fracture of right ankle 06/10/2018  . Closed fracture of left tibia and fibula with routine healing 04/13/18 06/10/2018  . Atrial fibrillation ([place]) 04/15/2018  . Chronic diastolic CHF (congestive heart failure) ([place]) 04/15/2018  . CKD (chronic kidney disease), stage III ([place]) 04/15/2018  . Closed right fibular fracture 04/15/2018  . Left tibial fracture 04/14/2018  . CHF exacerbation ([place]) 04/10/2018  . SOB (shortness of breath)   . Acute CHF (congestive heart failure) ([place]) 04/01/2018  . Atrial fibrillation with RVR ([place]) 04/01/2018  . [place] 03/11/2016  . Muscle spasms of lower extremity 03/04/2016  . Acute renal insufficiency 02/26/2016  . History of MRSA infection 02/26/2016  . Diabetes type 2, uncontrolled ([place]) 02/19/2016  . HTN (hypertension) 02/19/2016  . Fracture, femur, distal ([place]) 02/19/2016  . Fall 02/19/2016  . Depression 02/19/2016  . Closed left femoral fracture, initial encounter 02/19/2016  . Femur fracture, left ([place]) 02/19/2016   PCP:  [name], MD Pharmacy:   [facility] [address] [place] - [place] [address] [place] [address] Phone: [phone] Fax: [phone]     Social Determinants of Health (SDOH) Interventions    Readmission Risk Interventions No flowsheet data found.

## 2018-12-07 NOTE — Progress Notes (Signed)
Patient was seen and examined-she does have a ulcerated area in the anterior abdominal wall-and several hard indurated areas without any fluctuation.  I spoke with radiologist-Dr. Salvatore Marvel over the phone-there appears to be cellulitis-with skin thickening-but no drainable lesion suggestive an abscess.  Patient seems to be fixated about a UTI-and claims that her urine is dark appearing-but really has no convincing symptoms of UTI.  I strongly suspect that her prior urine cultures are more reflective of colonization rather than a true infection.  She apparently has completed a two-week course of ertapenem intramuscularly approximately 10 days ago or so.  I plan on discharging this patient later today on doxycycline-I have touched base with ID over the phone.  I have also touched base with patient's primary care practitioner-we will order a repeat CT scan of this area in 10-14 days to make sure that this has not evolved into an abscess.  Full note to follow.

## 2018-12-07 NOTE — Discharge Summary (Signed)
PATIENT DETAILS Name: [name] Age: 68 y.o. Sex: female Date of Birth: [birth year] MRN: [id number]. Admitting Physician: Lenore Cordia, MD YHC:[id number], Ronie Spies, MD  Admit Date: 12/05/2018 Discharge date: 12/07/2018  Recommendations for Outpatient Follow-up:  1. Follow up with PCP in 1-2 weeks 2. Please obtain BMP/CBC in one week 3. Need to repeat CT scan of the abdomen to ensure that these indurated areas in the right lower quadrant have not evolved into an abscess after patient completes a course of antimicrobial therapy.  Admitted From:  Home with home health services  Disposition: Home with home health services   Calvin: Yes  Equipment/Devices: None  Discharge Condition: Stable  CODE STATUS: FULL CODE  Diet recommendation:  Heart Healthy / Carb Modified   Brief Summary: See H&P, Labs, Consult and Test reports for all details in brief, patient is a 68 year old female with history of chronic diastolic heart failure, atrial fibrillation, COPD, DM-2-recurrent UTIs/colonization with ESBL Klebsiella pneumonia-presented with anterior abdominal wall cellulitis.  Brief Hospital Course: Right anterior abdominal wall cellulitis with superficial ulceration: Improved-afebrile and without leukocytosis.  Managed with vancomycin and Rocephin.  CT scan of the abdomen on 4/18 without any obvious abscesses (discussed with radiology on the phone on day of discharge).  She still has induration around the cellulitis site-and in due time could potentially develop a abscess, recommendations at this point are to transition to doxycycline for at least 10 days-and have patient follow-up with PCP for repeat CT scan of this area to ensure this has not evolved into an abscess.  This plan was discussed with the patient-and also with the patient's PCP over the phone.  Patient was briefly seen by ID on the day of discharge as well.  Patient has a follow-up appointment with infectious  disease on 4/30.  Home health services have been arranged.  ?  UTI vs colonization with ESBL Klebsiella: I strongly suspect that this patient has colonization rather than infection.  Even during this hospital stay-she had no obvious symptoms of UTI-patient seems fixated with dark-colored urine that she thinks is UTI-extensive education/counseling provided.  Patient has had extensive antimicrobial therapy in the recent past-and I suspect that it is prudent to hold off on antimicrobial therapy unless she has very obvious symptoms.  As noted above-ID follow-up has been arranged for 4/30-suspect she needs significant amount of counseling/education regarding this issue.  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  Anemia of chronic disease: Hemoglobin stable-stable for outpatient follow-up with PCP.  No obvious blood loss.  Chronic diastolic heart failure: Volume status stable-continue Lasix  COPD: Stable-resume usual bronchodilator regimen  DM-2: Resume glipizide  Anxiety with depression: Continue Lexapro and Wellbutrin.  Deconditioning/debility: Very frail appearing-mostly bed to wheelchair bound.  Home with services have been ordered.  Procedures/Studies: None  Discharge Diagnoses:  Principal Problem:   Wound, open, anterior abdominal wall Active Problems:   Diabetes type 2, uncontrolled (HCC)   HTN (hypertension)   Depression   Atrial fibrillation (HCC)   Chronic diastolic CHF (congestive heart failure) (HCC)   COPD (chronic obstructive pulmonary disease) ([place])   Discharge Instructions:  Activity:  As tolerated    Discharge Instructions    Diet - low sodium heart healthy   Complete by:  As directed    Discharge instructions   Complete by:  As directed    Follow with Primary MD  [name], MD in 1 week  Please  ask your primary care practitioner to repeat a CT scan of the abdomen  in 2 weeks.  Please get a complete blood count and chemistry panel checked by your Primary MD at your next visit, and again as instructed by your Primary MD.  Get Medicines reviewed and adjusted: Please take all your medications with you for your next visit with your Primary MD  Laboratory/radiological data: Please request your Primary MD to go over all hospital tests and procedure/radiological results at the follow up, please ask your Primary MD to get all Hospital records sent to his/her office.  In some cases, they will be blood work, cultures and biopsy results pending at the time of your discharge. Please request that your primary care M.D. follows up on these results.  Also Note the following: If you experience worsening of your admission symptoms, develop shortness of breath, life threatening emergency, suicidal or homicidal thoughts you must seek medical attention immediately by calling 911 or calling your MD immediately  if symptoms less severe.  You must read complete instructions/literature along with all the possible adverse reactions/side effects for all the Medicines you take and that have been prescribed to you. Take any new Medicines after you have completely understood and accpet all the possible adverse reactions/side effects.   Do not drive when taking Pain medications or sleeping medications (Benzodaizepines)  Do not take more than prescribed Pain, Sleep and Anxiety Medications. It is not advisable to combine anxiety,sleep and pain medications without talking with your primary care practitioner  Special Instructions: If you have smoked or chewed Tobacco  in the last 2 yrs please stop smoking, stop any regular Alcohol  and or any Recreational drug use.  Wear Seat belts while driving.  Please note: You were cared for by a hospitalist during your hospital stay. Once you are discharged, your primary care physician will handle any further medical issues. Please note that NO  REFILLS for any discharge medications will be authorized once you are discharged, as it is imperative that you return to your primary care physician (or establish a relationship with a primary care physician if you do not have one) for your post hospital discharge needs so that they can reassess your need for medications and monitor your lab values.   Increase activity slowly   Complete by:  As directed      Allergies as of 12/07/2018      Reactions   Propranolol Swelling   Pt states it may have been leg swelling   Topamax [topiramate] Other (See Comments)   hallucinations   Latex Itching, Rash      Medication List    STOP taking these medications   dicyclomine 10 MG capsule Commonly known as:  BENTYL   guaifenesin 400 MG Tabs tablet Commonly known as:  Mucus Relief   potassium chloride SA 20 MEQ tablet Commonly known as:  K-DUR   simethicone 80 MG chewable tablet Commonly known as:  MYLICON   traZODone 50 MG tablet Commonly known as:  DESYREL     TAKE these medications   acetaminophen 500 MG tablet Commonly known as:  TYLENOL Take 1,000 mg by mouth every 8 (eight) hours as needed for mild pain or headache.   albuterol 108 (90 Base) MCG/ACT inhaler Commonly known as:  VENTOLIN HFA Inhale 2 puffs into the lungs every 6 (six) hours as needed for wheezing or shortness of breath.   apixaban 5 MG Tabs tablet Commonly known as:  ELIQUIS Take 1 tablet (  5 mg total) by mouth 2 (two) times daily.   diltiazem 120 MG 24 hr capsule Commonly known as:  CARDIZEM CD Take 1 capsule (120 mg total) by mouth daily. What changed:  additional instructions   doxycycline 100 MG tablet Commonly known as:  VIBRA-TABS Take 1 tablet (100 mg total) by mouth 2 (two) times daily.   escitalopram 5 MG tablet Commonly known as:  LEXAPRO Take 1 tablet (5 mg total) by mouth daily.   ferrous sulfate 325 (65 FE) MG tablet Take 325 mg by mouth daily with breakfast.   fluticasone 50 MCG/ACT  nasal spray Commonly known as:  FLONASE Place 2 sprays into both nostrils daily as needed for allergies or rhinitis (congestion).   furosemide 20 MG tablet Commonly known as:  LASIX Take 20 mg by mouth daily. What changed:  Another medication with the same name was removed. Continue taking this medication, and follow the directions you see here.   glipiZIDE 10 MG tablet Commonly known as:  GLUCOTROL Take 10 mg by mouth 2 (two) times daily.   metoprolol tartrate 25 MG tablet Commonly known as:  LOPRESSOR Take 1 tablet (25 mg total) by mouth 2 (two) times daily.   nystatin powder Commonly known as:  MYCOSTATIN/NYSTOP Apply topically daily as needed (rash in skin folds).   OVER THE COUNTER MEDICATION Apply 1 application topically daily as needed (rash in skin folds). Baby butt paste   pantoprazole 40 MG tablet Commonly known as:  PROTONIX Take 1 tablet (40 mg total) by mouth daily before breakfast. What changed:    when to take this  reasons to take this   pioglitazone 45 MG tablet Commonly known as:  ACTOS Take 45 mg by mouth daily.      Follow-up Information    [facility] [place] [facility].   Specialty:  General Surgery Why:  For any further management of this issue Contact information: [address] [address] [phone]        Cleon Gustin, MD. Schedule an appointment as soon as possible for a visit in 2 week(s).   Specialty:  Urology Why:  As soon as possible for follow up for persistent UTI symptoms Contact information: [address] [place] [address] [place] Follow up.   Why:  They will continue to do your home health care at your home       [name], MD. Schedule an appointment as soon as possible for a visit in 1 week(s).   Specialty:  Internal Medicine Contact information: [address] [place] [address] [phone]         Herminio Commons, MD. Schedule an appointment as soon as possible for a visit in 1 month(s).   Specialty:  Cardiology Contact information: [place] [address] [phone]        Michel Bickers, MD Follow up on 12/16/2018.   Specialty:  Infectious Diseases Why:  appt at 10 am Contact information: [address] [place]  [address] [phone]          Allergies  Allergen Reactions   Propranolol Swelling    Pt states it may have been leg swelling   Topamax [Topiramate] Other (See Comments)    hallucinations   Latex Itching and Rash    Consultations:   ID   Other Procedures/Studies: Ct Abdomen Pelvis W Contrast  Result Date: 12/05/2018 CLINICAL DATA:  Pt states she has several chronic abscesses to abdomen. Pt has also been on antibiotics for 6 months for UTI. Today is having abd pain. EXAM: CT ABDOMEN AND PELVIS WITH CONTRAST TECHNIQUE: Multidetector CT imaging of the abdomen and pelvis was performed using the standard protocol following bolus administration of intravenous contrast. CONTRAST:  136m OMNIPAQUE IOHEXOL 300 MG/ML  SOLN COMPARISON:  09/03/2018 FINDINGS: Lower chest: Trace right pleural effusion. Hepatobiliary: No focal liver abnormality is seen. Status post cholecystectomy. Dilated common bile duct likely secondary to post cholecystectomy state. Pancreas: Severe pancreatic atrophy. Spleen: Normal in size without focal abnormality. Adrenals/Urinary Tract: Adrenal glands are unremarkable. Kidneys are normal, without renal calculi, focal lesion, or hydronephrosis. Bladder is unremarkable. Stomach/Bowel: Stomach is within normal limits. Appendix appears normal. No evidence of bowel wall thickening, distention, or inflammatory changes. Diverticulosis without evidence of diverticulitis. Vascular/Lymphatic: Abdominal aortic atherosclerosis. Normal caliber abdominal aorta. No lymphadenopathy. Reproductive: Status post hysterectomy. No  adnexal masses. Other: Small fat containing umbilical hernia. No ascites. 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 Musculoskeletal: No acute osseous abnormality. No aggressive osseous lesion. Degenerative disc disease with disc height loss at L4-5 and L5-S1 with bilateral facet arthropathy. Grade 1 anterolisthesis of L4 on L5 secondary to facet disease. Severe bilateral foraminal stenosis at L4-5. IMPRESSION: 1. No acute abdominal or pelvic pathology. 2. Diverticulosis without evidence of diverticulitis. Electronically Signed   By: HKathreen Devoid  On: 12/05/2018 15:41     TODAY-DAY OF DISCHARGE:  Subjective:   VRomero Bellingtoday has no headache,no chest abdominal pain,no new weakness tingling or numbness, feels much better wants to go home today.   Objective:   Blood pressure (!) 136/48, pulse 72, temperature 98 F (36.7 C), temperature source Oral, resp. rate 20, height 5' 3"  (1.6 m), weight 96 kg, SpO2 98 %.  Intake/Output Summary (Last 24 hours) at 12/07/2018 1257 Last data filed at 12/07/2018 0659 Gross per 24 hour  Intake 790 ml  Output 1675 ml  Net -885 ml   Filed Weights   12/06/18 0322 12/06/18 0614 12/07/18 0500  Weight: 98.1 kg 96.9 kg 96 kg    Exam: Awake Alert, Oriented *3, No new F.N deficits, Normal affect [place].AT,PERRAL Supple Neck,No JVD, No cervical lymphadenopathy appriciated.  Symmetrical Chest wall movement, Good air movement bilaterally, CTAB RRR,No Gallops,Rubs or new Murmurs, No Parasternal Heave +ve B.Sounds, Abd Soft, Non tender, No organomegaly appriciated, No rebound -guarding or rigidity. No Cyanosis, Clubbing or edema, No new Rash or bruise   PERTINENT RADIOLOGIC STUDIES: Ct Abdomen Pelvis W Contrast  Result Date: 12/05/2018 CLINICAL DATA:  Pt states she has several chronic abscesses to abdomen. Pt has also been on antibiotics for 6 months for UTI. Today is having abd pain. EXAM: CT ABDOMEN  AND PELVIS WITH CONTRAST TECHNIQUE: Multidetector CT imaging of the abdomen and pelvis was performed using the standard protocol following bolus administration of intravenous contrast. CONTRAST:  1060mOMNIPAQUE IOHEXOL 300 MG/ML  SOLN COMPARISON:  09/03/2018 FINDINGS: Lower chest: Trace right pleural effusion. Hepatobiliary: No focal liver abnormality is seen. Status post cholecystectomy. Dilated common bile duct likely secondary to post cholecystectomy state. Pancreas: Severe pancreatic atrophy. Spleen: Normal in size without focal abnormality. Adrenals/Urinary Tract: Adrenal glands are unremarkable. Kidneys are normal, without renal calculi, focal lesion, or hydronephrosis. Bladder is unremarkable. Stomach/Bowel: Stomach is within normal limits. Appendix appears normal. No evidence of bowel wall thickening, distention,  or inflammatory changes. Diverticulosis without evidence of diverticulitis. Vascular/Lymphatic: Abdominal aortic atherosclerosis. Normal caliber abdominal aorta. No lymphadenopathy. Reproductive: Status post hysterectomy. No adnexal masses. Other: Small fat containing umbilical hernia. No ascites. 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 Musculoskeletal: No acute osseous abnormality. No aggressive osseous lesion. Degenerative disc disease with disc height loss at L4-5 and L5-S1 with bilateral facet arthropathy. Grade 1 anterolisthesis of L4 on L5 secondary to facet disease. Severe bilateral foraminal stenosis at L4-5. IMPRESSION: 1. No acute abdominal or pelvic pathology. 2. Diverticulosis without evidence of diverticulitis. Electronically Signed   By: Kathreen Devoid   On: 12/05/2018 15:41     PERTINENT LAB RESULTS: CBC: Recent Labs    12/06/18 0329 12/07/18 0251  WBC 7.5 8.2  HGB 7.7* 8.3*  HCT 25.4* 28.2*  PLT 322 380   CMET CMP     Component Value Date/Time   NA 140 12/07/2018 0251   K 3.4 (L) 12/07/2018 0251   CL 101  12/07/2018 0251   CO2 26 12/07/2018 0251   GLUCOSE 146 (H) 12/07/2018 0251   BUN 10 12/07/2018 0251   CREATININE 0.73 12/07/2018 0251   CALCIUM 8.8 (L) 12/07/2018 0251   PROT 6.2 (L) 12/05/2018 1318   ALBUMIN 2.7 (L) 12/05/2018 1318   AST 10 (L) 12/05/2018 1318   ALT 8 12/05/2018 1318   ALKPHOS 89 12/05/2018 1318   BILITOT 0.7 12/05/2018 1318   GFRNONAA >60 12/07/2018 0251   GFRAA >60 12/07/2018 0251    GFR Estimated Creatinine Clearance: 75.2 mL/min (by C-G formula based on SCr of 0.73 mg/dL). No results for input(s): LIPASE, AMYLASE in the last 72 hours. No results for input(s): CKTOTAL, CKMB, CKMBINDEX, TROPONINI in the last 72 hours. Invalid input(s): POCBNP No results for input(s): DDIMER in the last 72 hours. Recent Labs    12/05/18 1318  HGBA1C 7.7*   No results for input(s): CHOL, HDL, LDLCALC, TRIG, CHOLHDL, LDLDIRECT in the last 72 hours. No results for input(s): TSH, T4TOTAL, T3FREE, THYROIDAB in the last 72 hours.  Invalid input(s): FREET3 No results for input(s): VITAMINB12, FOLATE, FERRITIN, TIBC, IRON, RETICCTPCT in the last 72 hours. Coags: No results for input(s): INR in the last 72 hours.  Invalid input(s): PT Microbiology: No results found for this or any previous visit (from the past 240 hour(s)).  FURTHER DISCHARGE INSTRUCTIONS:  Get Medicines reviewed and adjusted: Please take all your medications with you for your next visit with your Primary MD  Laboratory/radiological data: Please request your Primary MD to go over all hospital tests and procedure/radiological results at the follow up, please ask your Primary MD to get all Hospital records sent to his/her office.  In some cases, they will be blood work, cultures and biopsy results pending at the time of your discharge. Please request that your primary care M.D. goes through all the records of your hospital data and follows up on these results.  Also Note the following: If you experience  worsening of your admission symptoms, develop shortness of breath, life threatening emergency, suicidal or homicidal thoughts you must seek medical attention immediately by calling 911 or calling your MD immediately  if symptoms less severe.  You must read complete instructions/literature along with all the possible adverse reactions/side effects for all the Medicines you take and that have been prescribed to you. Take any new Medicines after you have completely understood and accpet all the possible adverse reactions/side effects.  Do not drive when taking Pain medications or sleeping medications (Benzodaizepines)  Do not take more than prescribed Pain, Sleep and Anxiety Medications. It is not advisable to combine anxiety,sleep and pain medications without talking with your primary care practitioner  Special Instructions: If you have smoked or chewed Tobacco  in the last 2 yrs please stop smoking, stop any regular Alcohol  and or any Recreational drug use.  Wear Seat belts while driving.  Please note: You were cared for by a hospitalist during your hospital stay. Once you are discharged, your primary care physician will handle any further medical issues. Please note that NO REFILLS for any discharge medications will be authorized once you are discharged, as it is imperative that you return to your primary care physician (or establish a relationship with a primary care physician if you do not have one) for your post hospital discharge needs so that they can reassess your need for medications and monitor your lab values.  Total Time spent coordinating discharge including counseling, education and face to face time equals 35 minutes.  SignedOren Binet 12/07/2018 12:57 PM

## 2018-12-07 NOTE — Consult Note (Signed)
   [facility] Inpatient Consult   12/07/2018  Nyomi M Kuznia [birth year] [id number]    Follow-up note:  Care coordination call made with transition of care CM regarding patient's post discharge needs and had indicated that there are none identified at this point.  Called to speak with patient over the phone in her room to check for any possible discharge and follow-up needs with [facility] care management, but staff reports that patient had already been discharged home and patient's RN had left for the day.  Per chart review, patient was discharge to home with resumption of home health services ([facility]), [facility] for wound care. Patient transitioned to home with her spouse; has prescription drug coverage under [employer]; uses [employer] in [place] and spouse provides transportation. Patient has a follow-up with ID- Infectious Disease on 4/30; needing repeat CT scan of the abdomen to ensure that indurated areas in the right lower quadrant have not evolved into an abscess after patient completes course of antimicrobial therapy.  Patient may benefit from EMMI calls to follow-up continued recovery. Referral made for [facility] General calls post discharge.   For questions and additional information, please contact:  Alisa Stjames A. Saber Dickerman, BSN, RN-BC [facility] Liaison Cell: [phone]

## 2018-12-08 DIAGNOSIS — N183 Chronic kidney disease, stage 3 (moderate): Secondary | ICD-10-CM | POA: Diagnosis not present

## 2018-12-08 DIAGNOSIS — E1122 Type 2 diabetes mellitus with diabetic chronic kidney disease: Secondary | ICD-10-CM | POA: Diagnosis not present

## 2018-12-08 DIAGNOSIS — Z7901 Long term (current) use of anticoagulants: Secondary | ICD-10-CM | POA: Diagnosis not present

## 2018-12-08 DIAGNOSIS — F329 Major depressive disorder, single episode, unspecified: Secondary | ICD-10-CM | POA: Diagnosis not present

## 2018-12-08 DIAGNOSIS — I5032 Chronic diastolic (congestive) heart failure: Secondary | ICD-10-CM | POA: Diagnosis not present

## 2018-12-08 DIAGNOSIS — K51911 Ulcerative colitis, unspecified with rectal bleeding: Secondary | ICD-10-CM | POA: Diagnosis not present

## 2018-12-08 DIAGNOSIS — M810 Age-related osteoporosis without current pathological fracture: Secondary | ICD-10-CM | POA: Diagnosis not present

## 2018-12-08 DIAGNOSIS — I482 Chronic atrial fibrillation, unspecified: Secondary | ICD-10-CM | POA: Diagnosis not present

## 2018-12-08 DIAGNOSIS — J449 Chronic obstructive pulmonary disease, unspecified: Secondary | ICD-10-CM | POA: Diagnosis not present

## 2018-12-08 DIAGNOSIS — Z6841 Body Mass Index (BMI) 40.0 and over, adult: Secondary | ICD-10-CM | POA: Diagnosis not present

## 2018-12-08 DIAGNOSIS — I130 Hypertensive heart and chronic kidney disease with heart failure and stage 1 through stage 4 chronic kidney disease, or unspecified chronic kidney disease: Secondary | ICD-10-CM | POA: Diagnosis not present

## 2018-12-08 DIAGNOSIS — Z87440 Personal history of urinary (tract) infections: Secondary | ICD-10-CM | POA: Diagnosis not present

## 2018-12-09 DIAGNOSIS — S7292XA Unspecified fracture of left femur, initial encounter for closed fracture: Secondary | ICD-10-CM | POA: Diagnosis not present

## 2018-12-09 DIAGNOSIS — Z6841 Body Mass Index (BMI) 40.0 and over, adult: Secondary | ICD-10-CM | POA: Diagnosis not present

## 2018-12-09 DIAGNOSIS — F329 Major depressive disorder, single episode, unspecified: Secondary | ICD-10-CM | POA: Diagnosis not present

## 2018-12-09 DIAGNOSIS — I5032 Chronic diastolic (congestive) heart failure: Secondary | ICD-10-CM | POA: Diagnosis not present

## 2018-12-09 DIAGNOSIS — J449 Chronic obstructive pulmonary disease, unspecified: Secondary | ICD-10-CM | POA: Diagnosis not present

## 2018-12-09 DIAGNOSIS — M810 Age-related osteoporosis without current pathological fracture: Secondary | ICD-10-CM | POA: Diagnosis not present

## 2018-12-09 DIAGNOSIS — I130 Hypertensive heart and chronic kidney disease with heart failure and stage 1 through stage 4 chronic kidney disease, or unspecified chronic kidney disease: Secondary | ICD-10-CM | POA: Diagnosis not present

## 2018-12-09 DIAGNOSIS — N183 Chronic kidney disease, stage 3 (moderate): Secondary | ICD-10-CM | POA: Diagnosis not present

## 2018-12-09 DIAGNOSIS — K51911 Ulcerative colitis, unspecified with rectal bleeding: Secondary | ICD-10-CM | POA: Diagnosis not present

## 2018-12-09 DIAGNOSIS — Z87440 Personal history of urinary (tract) infections: Secondary | ICD-10-CM | POA: Diagnosis not present

## 2018-12-09 DIAGNOSIS — Z7901 Long term (current) use of anticoagulants: Secondary | ICD-10-CM | POA: Diagnosis not present

## 2018-12-09 DIAGNOSIS — I482 Chronic atrial fibrillation, unspecified: Secondary | ICD-10-CM | POA: Diagnosis not present

## 2018-12-09 DIAGNOSIS — Z9181 History of falling: Secondary | ICD-10-CM | POA: Diagnosis not present

## 2018-12-09 DIAGNOSIS — E1122 Type 2 diabetes mellitus with diabetic chronic kidney disease: Secondary | ICD-10-CM | POA: Diagnosis not present

## 2018-12-10 DIAGNOSIS — E1122 Type 2 diabetes mellitus with diabetic chronic kidney disease: Secondary | ICD-10-CM | POA: Diagnosis not present

## 2018-12-10 DIAGNOSIS — L03311 Cellulitis of abdominal wall: Secondary | ICD-10-CM | POA: Diagnosis not present

## 2018-12-10 DIAGNOSIS — I482 Chronic atrial fibrillation, unspecified: Secondary | ICD-10-CM | POA: Diagnosis not present

## 2018-12-10 DIAGNOSIS — I130 Hypertensive heart and chronic kidney disease with heart failure and stage 1 through stage 4 chronic kidney disease, or unspecified chronic kidney disease: Secondary | ICD-10-CM | POA: Diagnosis not present

## 2018-12-10 DIAGNOSIS — F329 Major depressive disorder, single episode, unspecified: Secondary | ICD-10-CM | POA: Diagnosis not present

## 2018-12-10 DIAGNOSIS — I5032 Chronic diastolic (congestive) heart failure: Secondary | ICD-10-CM | POA: Diagnosis not present

## 2018-12-10 DIAGNOSIS — S7292XA Unspecified fracture of left femur, initial encounter for closed fracture: Secondary | ICD-10-CM | POA: Diagnosis not present

## 2018-12-10 DIAGNOSIS — S31109D Unspecified open wound of abdominal wall, unspecified quadrant without penetration into peritoneal cavity, subsequent encounter: Secondary | ICD-10-CM | POA: Diagnosis not present

## 2018-12-10 DIAGNOSIS — J449 Chronic obstructive pulmonary disease, unspecified: Secondary | ICD-10-CM | POA: Diagnosis not present

## 2018-12-10 DIAGNOSIS — Z6841 Body Mass Index (BMI) 40.0 and over, adult: Secondary | ICD-10-CM | POA: Diagnosis not present

## 2018-12-10 DIAGNOSIS — E1165 Type 2 diabetes mellitus with hyperglycemia: Secondary | ICD-10-CM | POA: Diagnosis not present

## 2018-12-10 DIAGNOSIS — Z9181 History of falling: Secondary | ICD-10-CM | POA: Diagnosis not present

## 2018-12-10 DIAGNOSIS — Z87440 Personal history of urinary (tract) infections: Secondary | ICD-10-CM | POA: Diagnosis not present

## 2018-12-10 DIAGNOSIS — N183 Chronic kidney disease, stage 3 (moderate): Secondary | ICD-10-CM | POA: Diagnosis not present

## 2018-12-10 LAB — URINE CULTURE: Culture: 10000 — AB

## 2018-12-13 DIAGNOSIS — I5032 Chronic diastolic (congestive) heart failure: Secondary | ICD-10-CM | POA: Diagnosis not present

## 2018-12-13 DIAGNOSIS — I482 Chronic atrial fibrillation, unspecified: Secondary | ICD-10-CM | POA: Diagnosis not present

## 2018-12-13 DIAGNOSIS — E1122 Type 2 diabetes mellitus with diabetic chronic kidney disease: Secondary | ICD-10-CM | POA: Diagnosis not present

## 2018-12-13 DIAGNOSIS — L03311 Cellulitis of abdominal wall: Secondary | ICD-10-CM | POA: Diagnosis not present

## 2018-12-13 DIAGNOSIS — S31109D Unspecified open wound of abdominal wall, unspecified quadrant without penetration into peritoneal cavity, subsequent encounter: Secondary | ICD-10-CM | POA: Diagnosis not present

## 2018-12-13 DIAGNOSIS — I130 Hypertensive heart and chronic kidney disease with heart failure and stage 1 through stage 4 chronic kidney disease, or unspecified chronic kidney disease: Secondary | ICD-10-CM | POA: Diagnosis not present

## 2018-12-13 DIAGNOSIS — Z6841 Body Mass Index (BMI) 40.0 and over, adult: Secondary | ICD-10-CM | POA: Diagnosis not present

## 2018-12-13 DIAGNOSIS — N183 Chronic kidney disease, stage 3 (moderate): Secondary | ICD-10-CM | POA: Diagnosis not present

## 2018-12-13 DIAGNOSIS — F329 Major depressive disorder, single episode, unspecified: Secondary | ICD-10-CM | POA: Diagnosis not present

## 2018-12-13 DIAGNOSIS — J449 Chronic obstructive pulmonary disease, unspecified: Secondary | ICD-10-CM | POA: Diagnosis not present

## 2018-12-13 DIAGNOSIS — E1165 Type 2 diabetes mellitus with hyperglycemia: Secondary | ICD-10-CM | POA: Diagnosis not present

## 2018-12-13 DIAGNOSIS — Z87440 Personal history of urinary (tract) infections: Secondary | ICD-10-CM | POA: Diagnosis not present

## 2018-12-14 DIAGNOSIS — Z6841 Body Mass Index (BMI) 40.0 and over, adult: Secondary | ICD-10-CM | POA: Diagnosis not present

## 2018-12-14 DIAGNOSIS — F329 Major depressive disorder, single episode, unspecified: Secondary | ICD-10-CM | POA: Diagnosis not present

## 2018-12-14 DIAGNOSIS — S31109D Unspecified open wound of abdominal wall, unspecified quadrant without penetration into peritoneal cavity, subsequent encounter: Secondary | ICD-10-CM | POA: Diagnosis not present

## 2018-12-14 DIAGNOSIS — Z87440 Personal history of urinary (tract) infections: Secondary | ICD-10-CM | POA: Diagnosis not present

## 2018-12-14 DIAGNOSIS — I482 Chronic atrial fibrillation, unspecified: Secondary | ICD-10-CM | POA: Diagnosis not present

## 2018-12-14 DIAGNOSIS — E1165 Type 2 diabetes mellitus with hyperglycemia: Secondary | ICD-10-CM | POA: Diagnosis not present

## 2018-12-14 DIAGNOSIS — N183 Chronic kidney disease, stage 3 (moderate): Secondary | ICD-10-CM | POA: Diagnosis not present

## 2018-12-14 DIAGNOSIS — J449 Chronic obstructive pulmonary disease, unspecified: Secondary | ICD-10-CM | POA: Diagnosis not present

## 2018-12-14 DIAGNOSIS — L03311 Cellulitis of abdominal wall: Secondary | ICD-10-CM | POA: Diagnosis not present

## 2018-12-14 DIAGNOSIS — E1122 Type 2 diabetes mellitus with diabetic chronic kidney disease: Secondary | ICD-10-CM | POA: Diagnosis not present

## 2018-12-14 DIAGNOSIS — I5032 Chronic diastolic (congestive) heart failure: Secondary | ICD-10-CM | POA: Diagnosis not present

## 2018-12-14 DIAGNOSIS — I130 Hypertensive heart and chronic kidney disease with heart failure and stage 1 through stage 4 chronic kidney disease, or unspecified chronic kidney disease: Secondary | ICD-10-CM | POA: Diagnosis not present

## 2018-12-15 ENCOUNTER — Telehealth: Payer: Self-pay | Admitting: Internal Medicine

## 2018-12-15 NOTE — Telephone Encounter (Signed)
COVID-19 Pre-Screening Questions: ° °Do you currently have a fever (>100 °F), chills or unexplained body aches? No  ° °Are you currently experiencing new cough, shortness of breath, sore throat, runny nose? No  °•  °Have you recently travelled outside the state of [place] in the last 14 days? No  °•  °Have you been in contact with someone that is currently pending confirmation of Covid19 testing or has been confirmed to have the Covid19 virus?  No  °

## 2018-12-16 ENCOUNTER — Other Ambulatory Visit: Payer: Self-pay

## 2018-12-16 ENCOUNTER — Encounter: Payer: Self-pay | Admitting: Internal Medicine

## 2018-12-16 ENCOUNTER — Ambulatory Visit (INDEPENDENT_AMBULATORY_CARE_PROVIDER_SITE_OTHER): Payer: BLUE CROSS/BLUE SHIELD | Admitting: Internal Medicine

## 2018-12-16 DIAGNOSIS — I130 Hypertensive heart and chronic kidney disease with heart failure and stage 1 through stage 4 chronic kidney disease, or unspecified chronic kidney disease: Secondary | ICD-10-CM | POA: Diagnosis not present

## 2018-12-16 DIAGNOSIS — E1122 Type 2 diabetes mellitus with diabetic chronic kidney disease: Secondary | ICD-10-CM | POA: Diagnosis not present

## 2018-12-16 DIAGNOSIS — E1165 Type 2 diabetes mellitus with hyperglycemia: Secondary | ICD-10-CM | POA: Diagnosis not present

## 2018-12-16 DIAGNOSIS — J449 Chronic obstructive pulmonary disease, unspecified: Secondary | ICD-10-CM | POA: Diagnosis not present

## 2018-12-16 DIAGNOSIS — L03311 Cellulitis of abdominal wall: Secondary | ICD-10-CM | POA: Diagnosis not present

## 2018-12-16 DIAGNOSIS — N390 Urinary tract infection, site not specified: Secondary | ICD-10-CM

## 2018-12-16 DIAGNOSIS — S31109D Unspecified open wound of abdominal wall, unspecified quadrant without penetration into peritoneal cavity, subsequent encounter: Secondary | ICD-10-CM | POA: Diagnosis not present

## 2018-12-16 DIAGNOSIS — I5032 Chronic diastolic (congestive) heart failure: Secondary | ICD-10-CM | POA: Diagnosis not present

## 2018-12-16 DIAGNOSIS — N183 Chronic kidney disease, stage 3 (moderate): Secondary | ICD-10-CM | POA: Diagnosis not present

## 2018-12-16 DIAGNOSIS — F329 Major depressive disorder, single episode, unspecified: Secondary | ICD-10-CM | POA: Diagnosis not present

## 2018-12-16 DIAGNOSIS — Z87440 Personal history of urinary (tract) infections: Secondary | ICD-10-CM | POA: Diagnosis not present

## 2018-12-16 DIAGNOSIS — I482 Chronic atrial fibrillation, unspecified: Secondary | ICD-10-CM | POA: Diagnosis not present

## 2018-12-16 DIAGNOSIS — Z6841 Body Mass Index (BMI) 40.0 and over, adult: Secondary | ICD-10-CM | POA: Diagnosis not present

## 2018-12-16 MED ORDER — DOXYCYCLINE HYCLATE 100 MG PO TABS: 100.0000 mg | ORAL_TABLET | Freq: Two times a day (BID) | ORAL | 0 refills | Status: AC

## 2018-12-16 NOTE — Progress Notes (Signed)
[name] for Infectious Disease  Reason for Consult: Chronic recurrent urinary tract infection Referring Provider: [name]  Assessment: The urinary symptoms she describes are very mild and I am not certain that they clearly represent a symptomatic UTI.  Her symptoms resolved although she has not received any recent antibiotic therapy that should be effective against her recently isolated Klebsiella species.  I talked to her about being very careful to limit antibiotic use in the future in order to avoid ever more resistant bacteria in the possibility of C. difficile colitis.  Her abdominal wall cellulitis has resolved.  She was very concerned that that the abdominal wall infection might be directly related to her urinary tract infection.  I told her that I had no concerns about this.  I suggested that she continue local wound care.  I do not feel that she needs a repeat CT scan at this time.  I will see her back in about 10 days.  Plan: 1. Complete doxycycline tomorrow 2. Continue wound care 3. Follow-up in 10 days  Patient Active Problem List   Diagnosis Date Noted  . Recurrent urinary tract infection 12/16/2018    Priority: High  . Wound, open, anterior abdominal wall 12/05/2018    Priority: High  . Hypotension due to hypovolemia   . Gastritis due to nonsteroidal anti-inflammatory drug   . Esophageal dysphagia   . GI bleed 09/03/2018  . Coagulopathy ([place])   . Colitis   . Lower abdominal pain   . Rectal bleeding   . Dehydration 08/16/2018  . Acute on chronic diastolic CHF (congestive heart failure) ([place]) 08/10/2018  . Acute lower UTI 08/10/2018  . Hypokalemia 08/10/2018  . COPD (chronic obstructive pulmonary disease) ([place]) 08/10/2018  . Thyroid lesion 08/10/2018  . Closed fracture of right ankle 06/10/2018  . Closed fracture of left tibia and fibula with routine healing 04/13/18 06/10/2018  . Atrial fibrillation ([place]) 04/15/2018  . Chronic  diastolic CHF (congestive heart failure) ([place]) 04/15/2018  . CKD (chronic kidney disease), stage III ([place]) 04/15/2018  . Closed right fibular fracture 04/15/2018  . Left tibial fracture 04/14/2018  . CHF exacerbation ([place]) 04/10/2018  . SOB (shortness of breath)   . Acute CHF (congestive heart failure) ([place]) 04/01/2018  . Atrial fibrillation with RVR ([place]) 04/01/2018  . [place] 03/11/2016  . Muscle spasms of lower extremity 03/04/2016  . Acute renal insufficiency 02/26/2016  . History of MRSA infection 02/26/2016  . Diabetes type 2, uncontrolled ([place]) 02/19/2016  . HTN (hypertension) 02/19/2016  . Fracture, femur, distal ([place]) 02/19/2016  . Fall 02/19/2016  . Depression 02/19/2016  . Closed left femoral fracture, initial encounter 02/19/2016  . Femur fracture, left ([place]) 02/19/2016    Patient's Medications  New Prescriptions   No medications on file  Previous Medications   ACETAMINOPHEN (TYLENOL) 500 MG TABLET    Take 1,000 mg by mouth every 8 (eight) hours as needed for mild pain or headache.    ALBUTEROL (PROVENTIL HFA;VENTOLIN HFA) 108 (90 BASE) MCG/ACT INHALER    Inhale 2 puffs into the lungs every 6 (six) hours as needed for wheezing or shortness of breath.   APIXABAN (ELIQUIS) 5 MG TABS TABLET    Take 1 tablet (5 mg total) by mouth 2 (two) times daily.   DILTIAZEM (CARDIZEM CD) 120 MG 24 HR CAPSULE    Take 1 capsule (120 mg total) by mouth daily.   ESCITALOPRAM (LEXAPRO) 5 MG TABLET  Take 1 tablet (5 mg total) by mouth daily.   FERROUS SULFATE 325 (65 FE) MG TABLET    Take 325 mg by mouth daily with breakfast.   FLUTICASONE (FLONASE) 50 MCG/ACT NASAL SPRAY    Place 2 sprays into both nostrils daily as needed for allergies or rhinitis (congestion).    FUROSEMIDE (LASIX) 20 MG TABLET    Take 20 mg by mouth daily.   GLIPIZIDE (GLUCOTROL) 10 MG TABLET    Take 10 mg by mouth 2 (two) times daily.   METOPROLOL TARTRATE (LOPRESSOR) 25 MG TABLET    Take 1 tablet (25 mg total) by mouth  2 (two) times daily.   NYSTATIN (MYCOSTATIN/NYSTOP) POWDER    Apply topically daily as needed (rash in skin folds).   OVER THE COUNTER MEDICATION    Apply 1 application topically daily as needed (rash in skin folds). Baby butt paste   PANTOPRAZOLE (PROTONIX) 40 MG TABLET    Take 1 tablet (40 mg total) by mouth daily before breakfast.   PIOGLITAZONE (ACTOS) 45 MG TABLET    Take 45 mg by mouth daily.  Modified Medications   Modified Medication Previous Medication   DOXYCYCLINE (VIBRA-TABS) 100 MG TABLET doxycycline (VIBRA-TABS) 100 MG tablet      Take 1 tablet (100 mg total) by mouth 2 (two) times daily for 1 day.    Take 1 tablet (100 mg total) by mouth 2 (two) times daily.  Discontinued Medications   No medications on file    HPI: [name] is a 68 y.o. female with a complicated medical history.  She says that she has had recurrent urinary tract infections for many years but that they have gotten worse over the past year.  She has been in and out of the hospital for multiple problems.  She says that each time she goes to the hospital she has a urinary tract infection.  She notes problems with urinary incontinence, occasional mild suprapubic discomfort, occasional mild back pain and minimal dysuria when she has a UTI.  She says that she never has a fever.  She has been on multiple antibiotics in the past year she had recently been in a skilled nursing facility and grew a multidrug-resistant Klebsiella.  She had been on multiple rounds of oral antibiotics.  She was treated with fosfomycin in late March and this was followed with a 14-day course of IM ertapenem which she completed several weeks ago.  She developed cellulitis of her right lower abdomen leading to admission to the hospital on April 19.  She had an open wound with surrounding erythema.  She had been concerned that the wound was caused by the depends underwear she had been using.  No fluid collection was noted in the abdominal wall by  CT scan.  She was afebrile.  She says that she felt like she had a urinary tract infection when she was admitted.  Urine culture grew Klebsiella again that was sensitive only to imipenem and trimethoprim sulfamethoxazole.  She was treated with vancomycin and ceftriaxone for 48 hours while hospitalized and discharged on oral doxycycline.  She says that her UTI symptoms have resolved.  She still has some induration around her abdominal wound but feels like it is doing better.  She is due to complete doxycycline tomorrow.  Review of Systems: Review of Systems  Constitutional: Negative for chills, diaphoresis and fever.  Gastrointestinal: Negative for abdominal pain, diarrhea, nausea and vomiting.  Genitourinary: Negative for dysuria, frequency and urgency.  Musculoskeletal: Negative for back pain.      Past Medical History:  Diagnosis Date  . Atrial fibrillation ([place])   . CHF (congestive heart failure) ([place])   . Depression   . Diabetes mellitus without complication ([place])   . 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 Hypertension   . Morbid obesity ([place])   . Osteoporosis   . UTI (lower urinary tract infection) 01/2016   Cipro for Klebsiella pneumoniae isolate    Social History   Tobacco Use  . Smoking status: Never Smoker  . Smokeless tobacco: Never Used  Substance Use Topics  . Alcohol use: No  . Drug use: No    Family History  Problem Relation Age of Onset  . Diabetes Mother        died in her 87's of a stroke  . Cancer Mother   . Stroke Mother   . Breast cancer Mother   . Diabetes Father        died in his 13's of a stroke.  . Stroke Father   . Diabetes Brother        died @ 27 of a stroke.  . Stroke Brother   . Diabetes Maternal Grandmother   . Diabetes Maternal Grandfather   . Diabetes Paternal Grandmother   . Diabetes Paternal Grandfather   . Breast cancer Maternal Aunt    Allergies  Allergen Reactions  . Propranolol Swelling     Pt states it may have been leg swelling  . Topamax [Topiramate] Other (See Comments)    hallucinations  . Latex Itching and Rash    OBJECTIVE: Vitals:   12/16/18 1012  BP: (!) 168/74  Pulse: 60  Temp: 98 F (36.7 C)  TempSrc: Oral  SpO2: 100%   There is no height or weight on file to calculate BMI.   Physical Exam Constitutional:      Comments: She is talkative and in good spirits.  Abdominal:     Comments: She has a very large abdominal pannus and multiple healed surgical scars.  She has an irregular open, superficial wound on her right lower abdomen.  The surrounding cellulitis has resolved but there continues to be some induration around the wound.  There is no drainage or odor.  Psychiatric:        Mood and Affect: Mood normal.     Microbiology: No results found for this or any previous visit (from the past 240 hour(s)).  Michel Bickers, MD [facility] for [facility] [phone] pager   [phone] cell 12/16/2018, 11:28 AM

## 2018-12-17 DIAGNOSIS — I130 Hypertensive heart and chronic kidney disease with heart failure and stage 1 through stage 4 chronic kidney disease, or unspecified chronic kidney disease: Secondary | ICD-10-CM | POA: Diagnosis not present

## 2018-12-17 DIAGNOSIS — L03311 Cellulitis of abdominal wall: Secondary | ICD-10-CM | POA: Diagnosis not present

## 2018-12-17 DIAGNOSIS — J449 Chronic obstructive pulmonary disease, unspecified: Secondary | ICD-10-CM | POA: Diagnosis not present

## 2018-12-17 DIAGNOSIS — F329 Major depressive disorder, single episode, unspecified: Secondary | ICD-10-CM | POA: Diagnosis not present

## 2018-12-17 DIAGNOSIS — I5032 Chronic diastolic (congestive) heart failure: Secondary | ICD-10-CM | POA: Diagnosis not present

## 2018-12-17 DIAGNOSIS — Z87440 Personal history of urinary (tract) infections: Secondary | ICD-10-CM | POA: Diagnosis not present

## 2018-12-17 DIAGNOSIS — N183 Chronic kidney disease, stage 3 (moderate): Secondary | ICD-10-CM | POA: Diagnosis not present

## 2018-12-17 DIAGNOSIS — E1165 Type 2 diabetes mellitus with hyperglycemia: Secondary | ICD-10-CM | POA: Diagnosis not present

## 2018-12-17 DIAGNOSIS — S31109D Unspecified open wound of abdominal wall, unspecified quadrant without penetration into peritoneal cavity, subsequent encounter: Secondary | ICD-10-CM | POA: Diagnosis not present

## 2018-12-17 DIAGNOSIS — I482 Chronic atrial fibrillation, unspecified: Secondary | ICD-10-CM | POA: Diagnosis not present

## 2018-12-17 DIAGNOSIS — E1122 Type 2 diabetes mellitus with diabetic chronic kidney disease: Secondary | ICD-10-CM | POA: Diagnosis not present

## 2018-12-17 DIAGNOSIS — Z6841 Body Mass Index (BMI) 40.0 and over, adult: Secondary | ICD-10-CM | POA: Diagnosis not present

## 2018-12-21 DIAGNOSIS — Z6841 Body Mass Index (BMI) 40.0 and over, adult: Secondary | ICD-10-CM | POA: Diagnosis not present

## 2018-12-21 DIAGNOSIS — I482 Chronic atrial fibrillation, unspecified: Secondary | ICD-10-CM | POA: Diagnosis not present

## 2018-12-21 DIAGNOSIS — S31109D Unspecified open wound of abdominal wall, unspecified quadrant without penetration into peritoneal cavity, subsequent encounter: Secondary | ICD-10-CM | POA: Diagnosis not present

## 2018-12-21 DIAGNOSIS — F329 Major depressive disorder, single episode, unspecified: Secondary | ICD-10-CM | POA: Diagnosis not present

## 2018-12-21 DIAGNOSIS — J449 Chronic obstructive pulmonary disease, unspecified: Secondary | ICD-10-CM | POA: Diagnosis not present

## 2018-12-21 DIAGNOSIS — L03311 Cellulitis of abdominal wall: Secondary | ICD-10-CM | POA: Diagnosis not present

## 2018-12-21 DIAGNOSIS — Z87440 Personal history of urinary (tract) infections: Secondary | ICD-10-CM | POA: Diagnosis not present

## 2018-12-21 DIAGNOSIS — I130 Hypertensive heart and chronic kidney disease with heart failure and stage 1 through stage 4 chronic kidney disease, or unspecified chronic kidney disease: Secondary | ICD-10-CM | POA: Diagnosis not present

## 2018-12-21 DIAGNOSIS — N183 Chronic kidney disease, stage 3 (moderate): Secondary | ICD-10-CM | POA: Diagnosis not present

## 2018-12-21 DIAGNOSIS — I5032 Chronic diastolic (congestive) heart failure: Secondary | ICD-10-CM | POA: Diagnosis not present

## 2018-12-21 DIAGNOSIS — E1165 Type 2 diabetes mellitus with hyperglycemia: Secondary | ICD-10-CM | POA: Diagnosis not present

## 2018-12-21 DIAGNOSIS — E1122 Type 2 diabetes mellitus with diabetic chronic kidney disease: Secondary | ICD-10-CM | POA: Diagnosis not present

## 2018-12-22 DIAGNOSIS — J449 Chronic obstructive pulmonary disease, unspecified: Secondary | ICD-10-CM | POA: Diagnosis not present

## 2018-12-22 DIAGNOSIS — S7292XA Unspecified fracture of left femur, initial encounter for closed fracture: Secondary | ICD-10-CM | POA: Diagnosis not present

## 2018-12-22 DIAGNOSIS — Z9181 History of falling: Secondary | ICD-10-CM | POA: Diagnosis not present

## 2018-12-23 DIAGNOSIS — Z87440 Personal history of urinary (tract) infections: Secondary | ICD-10-CM | POA: Diagnosis not present

## 2018-12-23 DIAGNOSIS — I482 Chronic atrial fibrillation, unspecified: Secondary | ICD-10-CM | POA: Diagnosis not present

## 2018-12-23 DIAGNOSIS — S31109D Unspecified open wound of abdominal wall, unspecified quadrant without penetration into peritoneal cavity, subsequent encounter: Secondary | ICD-10-CM | POA: Diagnosis not present

## 2018-12-23 DIAGNOSIS — I130 Hypertensive heart and chronic kidney disease with heart failure and stage 1 through stage 4 chronic kidney disease, or unspecified chronic kidney disease: Secondary | ICD-10-CM | POA: Diagnosis not present

## 2018-12-23 DIAGNOSIS — E1122 Type 2 diabetes mellitus with diabetic chronic kidney disease: Secondary | ICD-10-CM | POA: Diagnosis not present

## 2018-12-23 DIAGNOSIS — Z6841 Body Mass Index (BMI) 40.0 and over, adult: Secondary | ICD-10-CM | POA: Diagnosis not present

## 2018-12-23 DIAGNOSIS — E1165 Type 2 diabetes mellitus with hyperglycemia: Secondary | ICD-10-CM | POA: Diagnosis not present

## 2018-12-23 DIAGNOSIS — N183 Chronic kidney disease, stage 3 (moderate): Secondary | ICD-10-CM | POA: Diagnosis not present

## 2018-12-23 DIAGNOSIS — I5032 Chronic diastolic (congestive) heart failure: Secondary | ICD-10-CM | POA: Diagnosis not present

## 2018-12-23 DIAGNOSIS — L03311 Cellulitis of abdominal wall: Secondary | ICD-10-CM | POA: Diagnosis not present

## 2018-12-23 DIAGNOSIS — J449 Chronic obstructive pulmonary disease, unspecified: Secondary | ICD-10-CM | POA: Diagnosis not present

## 2018-12-23 DIAGNOSIS — F329 Major depressive disorder, single episode, unspecified: Secondary | ICD-10-CM | POA: Diagnosis not present

## 2018-12-27 ENCOUNTER — Telehealth: Payer: Self-pay | Admitting: Internal Medicine

## 2018-12-27 NOTE — Telephone Encounter (Signed)
COVID-19 Pre-Screening Questions:  Do you currently have a fever (>100 F), chills or unexplained body aches?no   Are you currently experiencing new cough, shortness of breath, sore throat, runny nose? No    Have you recently travelled outside the state of [place] in the last 14 days? No     Have you been in contact with someone that is currently pending confirmation of Covid19 testing or has been confirmed to have the [place] virus? No

## 2018-12-28 ENCOUNTER — Other Ambulatory Visit: Payer: Self-pay | Admitting: Internal Medicine

## 2018-12-28 ENCOUNTER — Other Ambulatory Visit: Payer: Self-pay

## 2018-12-28 ENCOUNTER — Ambulatory Visit: Payer: BLUE CROSS/BLUE SHIELD | Admitting: Internal Medicine

## 2018-12-28 ENCOUNTER — Encounter: Payer: Self-pay | Admitting: Internal Medicine

## 2018-12-28 DIAGNOSIS — S31109D Unspecified open wound of abdominal wall, unspecified quadrant without penetration into peritoneal cavity, subsequent encounter: Secondary | ICD-10-CM | POA: Diagnosis not present

## 2018-12-28 DIAGNOSIS — N39498 Other specified urinary incontinence: Secondary | ICD-10-CM

## 2018-12-28 DIAGNOSIS — E1169 Type 2 diabetes mellitus with other specified complication: Secondary | ICD-10-CM | POA: Diagnosis not present

## 2018-12-28 DIAGNOSIS — L02211 Cutaneous abscess of abdominal wall: Secondary | ICD-10-CM

## 2018-12-28 DIAGNOSIS — R32 Unspecified urinary incontinence: Secondary | ICD-10-CM | POA: Insufficient documentation

## 2018-12-28 DIAGNOSIS — N390 Urinary tract infection, site not specified: Secondary | ICD-10-CM | POA: Diagnosis not present

## 2018-12-28 DIAGNOSIS — Z0000 Encounter for general adult medical examination without abnormal findings: Secondary | ICD-10-CM | POA: Diagnosis not present

## 2018-12-28 NOTE — Assessment & Plan Note (Signed)
It seems as though her urinary incontinence gets worse when she has some incontinence on a daily basis for the past year.  I doubt that that has explained by persistent infection alone.  She might benefit from a urological evaluation to look for treatable causes of incontinence and treatable reasons for recurrent infection.

## 2018-12-28 NOTE — Progress Notes (Signed)
[facility] for Infectious Disease  Patient Active Problem List   Diagnosis Date Noted  . Recurrent urinary tract infection 12/16/2018    Priority: High  . Wound, open, anterior abdominal wall 12/05/2018    Priority: High  . Urinary incontinence 12/28/2018  . Hypotension due to hypovolemia   . Gastritis due to nonsteroidal anti-inflammatory drug   . Esophageal dysphagia   . GI bleed 09/03/2018  . Coagulopathy ([place])   . Colitis   . Lower abdominal pain   . Rectal bleeding   . Dehydration 08/16/2018  . Acute on chronic diastolic CHF (congestive heart failure) ([place]) 08/10/2018  . Acute lower UTI 08/10/2018  . Hypokalemia 08/10/2018  . COPD (chronic obstructive pulmonary disease) ([place]) 08/10/2018  . Thyroid lesion 08/10/2018  . Closed fracture of right ankle 06/10/2018  . Closed fracture of left tibia and fibula with routine healing 04/13/18 06/10/2018  . Atrial fibrillation ([place]) 04/15/2018  . Chronic diastolic CHF (congestive heart failure) ([place]) 04/15/2018  . CKD (chronic kidney disease), stage III ([place]) 04/15/2018  . Closed right fibular fracture 04/15/2018  . Left tibial fracture 04/14/2018  . CHF exacerbation ([place]) 04/10/2018  . SOB (shortness of breath)   . Acute CHF (congestive heart failure) ([place]) 04/01/2018  . Atrial fibrillation with RVR ([place]) 04/01/2018  . [place] 03/11/2016  . Muscle spasms of lower extremity 03/04/2016  . Acute renal insufficiency 02/26/2016  . History of MRSA infection 02/26/2016  . Diabetes type 2, uncontrolled ([place]) 02/19/2016  . HTN (hypertension) 02/19/2016  . Fracture, femur, distal ([place]) 02/19/2016  . Fall 02/19/2016  . Depression 02/19/2016  . Closed left femoral fracture, initial encounter 02/19/2016  . Femur fracture, left ([place]) 02/19/2016    Patient's Medications  New Prescriptions   No medications on file  Previous Medications   ACETAMINOPHEN (TYLENOL) 500 MG TABLET    Take 1,000 mg by mouth every 8 (eight)  hours as needed for mild pain or headache.    ALBUTEROL (PROVENTIL HFA;VENTOLIN HFA) 108 (90 BASE) MCG/ACT INHALER    Inhale 2 puffs into the lungs every 6 (six) hours as needed for wheezing or shortness of breath.   APIXABAN (ELIQUIS) 5 MG TABS TABLET    Take 1 tablet (5 mg total) by mouth 2 (two) times daily.   DILTIAZEM (CARDIZEM CD) 120 MG 24 HR CAPSULE    Take 1 capsule (120 mg total) by mouth daily.   ESCITALOPRAM (LEXAPRO) 5 MG TABLET    Take 1 tablet (5 mg total) by mouth daily.   FERROUS SULFATE 325 (65 FE) MG TABLET    Take 325 mg by mouth daily with breakfast.   FLUTICASONE (FLONASE) 50 MCG/ACT NASAL SPRAY    Place 2 sprays into both nostrils daily as needed for allergies or rhinitis (congestion).    FUROSEMIDE (LASIX) 20 MG TABLET    Take 20 mg by mouth daily.   GLIPIZIDE (GLUCOTROL) 10 MG TABLET    Take 10 mg by mouth 2 (two) times daily.   METOPROLOL TARTRATE (LOPRESSOR) 25 MG TABLET    Take 1 tablet (25 mg total) by mouth 2 (two) times daily.   NYSTATIN (MYCOSTATIN/NYSTOP) POWDER    Apply topically daily as needed (rash in skin folds).   OVER THE COUNTER MEDICATION    Apply 1 application topically daily as needed (rash in skin folds). Baby butt paste   PANTOPRAZOLE (PROTONIX) 40 MG TABLET    Take 1 tablet (40 mg total) by  mouth daily before breakfast.   PIOGLITAZONE (ACTOS) 45 MG TABLET    Take 45 mg by mouth daily.  Modified Medications   No medications on file  Discontinued Medications   No medications on file    Subjective: [name] is in for her routine follow-up visit.  At the time of her initial visit 10 days ago she felt like her dysuria and incontinence were better.  She was taking doxycycline at that time.  Her most recent urine culture from 12/06/2018 had grown an ESBL producing E. coli that should not have improved with the doxycycline.  He tells me that her incontinence is worse recently.  She wears a pull-up underwear all of the time.  She does not believe that she is  ever had a urological evaluation.  She notes that the incontinence is worse at night when she is laying in bed.  She says that her dysuria is a little bit worse but describes it as mild and intermittent.  She has not had any fever.  Her home care nurse is now only coming once each week to help examine and dress her right sided abdominal wall wound.  She and her husband change the dressing on other days.  Review of Systems: Review of Systems  Constitutional: Negative for chills, diaphoresis, fever and weight loss.  Gastrointestinal: Positive for abdominal pain. Negative for diarrhea, nausea and vomiting.  Genitourinary: Positive for dysuria. Negative for frequency and urgency.       Incontinence.    Past Medical History:  Diagnosis Date  . Atrial fibrillation ([place])   . CHF (congestive heart failure) ([place])   . Depression   . Diabetes mellitus without complication ([place])   . 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 Hypertension   . Morbid obesity ([place])   . Osteoporosis   . UTI (lower urinary tract infection) 01/2016   Cipro for Klebsiella pneumoniae isolate    Social History   Tobacco Use  . Smoking status: Never Smoker  . Smokeless tobacco: Never Used  Substance Use Topics  . Alcohol use: No  . Drug use: No    Family History  Problem Relation Age of Onset  . Diabetes Mother        died in her 84's of a stroke  . Cancer Mother   . Stroke Mother   . Breast cancer Mother   . Diabetes Father        died in his 29's of a stroke.  . Stroke Father   . Diabetes Brother        died @ 66 of a stroke.  . Stroke Brother   . Diabetes Maternal Grandmother   . Diabetes Maternal Grandfather   . Diabetes Paternal Grandmother   . Diabetes Paternal Grandfather   . Breast cancer Maternal Aunt     Allergies  Allergen Reactions  . Propranolol Swelling    Pt states it may have been leg swelling  . Topamax [Topiramate] Other (See Comments)     hallucinations  . Latex Itching and Rash    Objective: Vitals:   12/28/18 1054  BP: (!) 172/85  Pulse: 67  Temp: 98 F (36.7 C)   There is no height or weight on file to calculate BMI.  Physical Exam Constitutional:      Comments: She is pleasant and in no distress.  She is seated in a wheelchair.  Abdominal:     Comments: She has irregular shaped superficial  ulcers on her right lower abdominal wall.  She has extensive abdominal pannus.  There is some induration around the ulcers but this is less than it was 10 days ago.  There is no surrounding cellulitis.  There is no fluctuance or odor.  There is scant yellow drainage on the dressing.     Lab Results    Problem List Items Addressed This Visit      High   Wound, open, anterior abdominal wall    She has chronic superficial wounds of her abdominal wall related to her indurated abdominal pannus and local pressure ulceration.  I did not find any evidence of active infection there at this time.  I recommend continued wound care.  If the wound is not healing over the next month I would suggest referral to the wound care clinic.      Recurrent urinary tract infection    She has a strong belief that her symptoms are due to recurrent infection and has had multiple rounds of antibiotic therapy over the past year.  Talked to her again about the importance of finding a proper balance not over treating with antibiotics resulting in ever more resistant organisms and/your complications such as C. difficile colitis.  I will check another UA and urine culture today and follow-up with her once results are available.  She can follow-up here in 4 weeks.      Relevant Orders   Urinalysis, Routine w reflex microscopic   Urine Culture     Unprioritized   Urinary incontinence    It seems as though her urinary incontinence gets worse when she has some incontinence on a daily basis for the past year.  I doubt that that has explained by persistent  infection alone.  She might benefit from a urological evaluation to look for treatable causes of incontinence and treatable reasons for recurrent infection.          Michel Bickers, MD [facility] for [facility] [place] [facility] [phone] pager   [phone] cell 12/28/2018, 11:29 AM

## 2018-12-28 NOTE — Assessment & Plan Note (Signed)
She has a strong belief that her symptoms are due to recurrent infection and has had multiple rounds of antibiotic therapy over the past year.  Talked to her again about the importance of finding a proper balance not over treating with antibiotics resulting in ever more resistant organisms and/your complications such as C. difficile colitis.  I will check another UA and urine culture today and follow-up with her once results are available.  She can follow-up here in 4 weeks.

## 2018-12-28 NOTE — Assessment & Plan Note (Signed)
She has chronic superficial wounds of her abdominal wall related to her indurated abdominal pannus and local pressure ulceration.  I did not find any evidence of active infection there at this time.  I recommend continued wound care.  If the wound is not healing over the next month I would suggest referral to the wound care clinic.

## 2018-12-28 NOTE — Progress Notes (Signed)
Patient was unable to void for UA collection. Patient states she provided sample this morning with PCP Dr. Fara Olden. LPN called PCP office and left VM with Dr. Quentin Cornwall nurse to add culture to UA sample and fax results to [phone]. Left office contact phone number for PCP to call back with any questions.  Eugenia Mcalpine, LPN

## 2018-12-29 ENCOUNTER — Telehealth: Payer: Self-pay | Admitting: *Deleted

## 2018-12-29 DIAGNOSIS — I5032 Chronic diastolic (congestive) heart failure: Secondary | ICD-10-CM | POA: Diagnosis not present

## 2018-12-29 DIAGNOSIS — J449 Chronic obstructive pulmonary disease, unspecified: Secondary | ICD-10-CM | POA: Diagnosis not present

## 2018-12-29 DIAGNOSIS — I130 Hypertensive heart and chronic kidney disease with heart failure and stage 1 through stage 4 chronic kidney disease, or unspecified chronic kidney disease: Secondary | ICD-10-CM | POA: Diagnosis not present

## 2018-12-29 DIAGNOSIS — I482 Chronic atrial fibrillation, unspecified: Secondary | ICD-10-CM | POA: Diagnosis not present

## 2018-12-29 DIAGNOSIS — S31109D Unspecified open wound of abdominal wall, unspecified quadrant without penetration into peritoneal cavity, subsequent encounter: Secondary | ICD-10-CM | POA: Diagnosis not present

## 2018-12-29 DIAGNOSIS — N183 Chronic kidney disease, stage 3 (moderate): Secondary | ICD-10-CM | POA: Diagnosis not present

## 2018-12-29 DIAGNOSIS — E1122 Type 2 diabetes mellitus with diabetic chronic kidney disease: Secondary | ICD-10-CM | POA: Diagnosis not present

## 2018-12-29 DIAGNOSIS — Z6841 Body Mass Index (BMI) 40.0 and over, adult: Secondary | ICD-10-CM | POA: Diagnosis not present

## 2018-12-29 DIAGNOSIS — L03311 Cellulitis of abdominal wall: Secondary | ICD-10-CM | POA: Diagnosis not present

## 2018-12-29 DIAGNOSIS — F329 Major depressive disorder, single episode, unspecified: Secondary | ICD-10-CM | POA: Diagnosis not present

## 2018-12-29 DIAGNOSIS — E1165 Type 2 diabetes mellitus with hyperglycemia: Secondary | ICD-10-CM | POA: Diagnosis not present

## 2018-12-29 DIAGNOSIS — Z87440 Personal history of urinary (tract) infections: Secondary | ICD-10-CM | POA: Diagnosis not present

## 2018-12-29 NOTE — Telephone Encounter (Signed)
Patient's PCP did not keep urine (used only for glucose test), did not run urine analysis with culture. Patient states it is difficult to come to [place]/doctor's offices as she is in a wheelchair.  She asked if this could be done via home health. RN obtained orders for home health nursing to perform the lab, faxed to [place]. Landis Gandy, RN

## 2018-12-30 NOTE — Telephone Encounter (Signed)
  Patient is calling regarding  Request for  order be sent to [facility] to obtain urine specimen to test for UTI.   Patient advised order was sent on yesterday.   Laverle Patter, RN

## 2018-12-31 DIAGNOSIS — Z6841 Body Mass Index (BMI) 40.0 and over, adult: Secondary | ICD-10-CM | POA: Diagnosis not present

## 2018-12-31 DIAGNOSIS — S31109D Unspecified open wound of abdominal wall, unspecified quadrant without penetration into peritoneal cavity, subsequent encounter: Secondary | ICD-10-CM | POA: Diagnosis not present

## 2018-12-31 DIAGNOSIS — Z87440 Personal history of urinary (tract) infections: Secondary | ICD-10-CM | POA: Diagnosis not present

## 2018-12-31 DIAGNOSIS — E1165 Type 2 diabetes mellitus with hyperglycemia: Secondary | ICD-10-CM | POA: Diagnosis not present

## 2018-12-31 DIAGNOSIS — I130 Hypertensive heart and chronic kidney disease with heart failure and stage 1 through stage 4 chronic kidney disease, or unspecified chronic kidney disease: Secondary | ICD-10-CM | POA: Diagnosis not present

## 2018-12-31 DIAGNOSIS — I482 Chronic atrial fibrillation, unspecified: Secondary | ICD-10-CM | POA: Diagnosis not present

## 2018-12-31 DIAGNOSIS — L03311 Cellulitis of abdominal wall: Secondary | ICD-10-CM | POA: Diagnosis not present

## 2018-12-31 DIAGNOSIS — J449 Chronic obstructive pulmonary disease, unspecified: Secondary | ICD-10-CM | POA: Diagnosis not present

## 2018-12-31 DIAGNOSIS — N390 Urinary tract infection, site not specified: Secondary | ICD-10-CM | POA: Diagnosis not present

## 2018-12-31 DIAGNOSIS — E1122 Type 2 diabetes mellitus with diabetic chronic kidney disease: Secondary | ICD-10-CM | POA: Diagnosis not present

## 2018-12-31 DIAGNOSIS — I5032 Chronic diastolic (congestive) heart failure: Secondary | ICD-10-CM | POA: Diagnosis not present

## 2018-12-31 DIAGNOSIS — N183 Chronic kidney disease, stage 3 (moderate): Secondary | ICD-10-CM | POA: Diagnosis not present

## 2018-12-31 DIAGNOSIS — F329 Major depressive disorder, single episode, unspecified: Secondary | ICD-10-CM | POA: Diagnosis not present

## 2019-01-03 ENCOUNTER — Telehealth: Payer: Self-pay | Admitting: Hematology

## 2019-01-03 ENCOUNTER — Inpatient Hospital Stay: Admission: RE | Admit: 2019-01-03 | Payer: BLUE CROSS/BLUE SHIELD | Source: Ambulatory Visit

## 2019-01-03 NOTE — Telephone Encounter (Signed)
A new hem appt has been scheduled for the pt to see Dr. Irene Limbo on 5/28 at 11am. Pt aware to arrive 15 minutes early.

## 2019-01-04 DIAGNOSIS — J449 Chronic obstructive pulmonary disease, unspecified: Secondary | ICD-10-CM | POA: Diagnosis not present

## 2019-01-04 DIAGNOSIS — E1165 Type 2 diabetes mellitus with hyperglycemia: Secondary | ICD-10-CM | POA: Diagnosis not present

## 2019-01-04 DIAGNOSIS — Z6841 Body Mass Index (BMI) 40.0 and over, adult: Secondary | ICD-10-CM | POA: Diagnosis not present

## 2019-01-04 DIAGNOSIS — Z87440 Personal history of urinary (tract) infections: Secondary | ICD-10-CM | POA: Diagnosis not present

## 2019-01-04 DIAGNOSIS — N183 Chronic kidney disease, stage 3 (moderate): Secondary | ICD-10-CM | POA: Diagnosis not present

## 2019-01-04 DIAGNOSIS — E1122 Type 2 diabetes mellitus with diabetic chronic kidney disease: Secondary | ICD-10-CM | POA: Diagnosis not present

## 2019-01-04 DIAGNOSIS — I5032 Chronic diastolic (congestive) heart failure: Secondary | ICD-10-CM | POA: Diagnosis not present

## 2019-01-04 DIAGNOSIS — F329 Major depressive disorder, single episode, unspecified: Secondary | ICD-10-CM | POA: Diagnosis not present

## 2019-01-04 DIAGNOSIS — I482 Chronic atrial fibrillation, unspecified: Secondary | ICD-10-CM | POA: Diagnosis not present

## 2019-01-04 DIAGNOSIS — I130 Hypertensive heart and chronic kidney disease with heart failure and stage 1 through stage 4 chronic kidney disease, or unspecified chronic kidney disease: Secondary | ICD-10-CM | POA: Diagnosis not present

## 2019-01-04 DIAGNOSIS — S31109D Unspecified open wound of abdominal wall, unspecified quadrant without penetration into peritoneal cavity, subsequent encounter: Secondary | ICD-10-CM | POA: Diagnosis not present

## 2019-01-04 DIAGNOSIS — L03311 Cellulitis of abdominal wall: Secondary | ICD-10-CM | POA: Diagnosis not present

## 2019-01-05 ENCOUNTER — Encounter: Payer: Self-pay | Admitting: Internal Medicine

## 2019-01-05 ENCOUNTER — Telehealth: Payer: Self-pay | Admitting: *Deleted

## 2019-01-05 DIAGNOSIS — Z9181 History of falling: Secondary | ICD-10-CM | POA: Diagnosis not present

## 2019-01-05 DIAGNOSIS — S7292XA Unspecified fracture of left femur, initial encounter for closed fracture: Secondary | ICD-10-CM | POA: Diagnosis not present

## 2019-01-05 DIAGNOSIS — L03311 Cellulitis of abdominal wall: Secondary | ICD-10-CM | POA: Diagnosis not present

## 2019-01-05 DIAGNOSIS — S31109S Unspecified open wound of abdominal wall, unspecified quadrant without penetration into peritoneal cavity, sequela: Secondary | ICD-10-CM

## 2019-01-05 DIAGNOSIS — F329 Major depressive disorder, single episode, unspecified: Secondary | ICD-10-CM | POA: Diagnosis not present

## 2019-01-05 DIAGNOSIS — N3090 Cystitis, unspecified without hematuria: Secondary | ICD-10-CM | POA: Diagnosis not present

## 2019-01-05 DIAGNOSIS — Z6841 Body Mass Index (BMI) 40.0 and over, adult: Secondary | ICD-10-CM | POA: Diagnosis not present

## 2019-01-05 DIAGNOSIS — I130 Hypertensive heart and chronic kidney disease with heart failure and stage 1 through stage 4 chronic kidney disease, or unspecified chronic kidney disease: Secondary | ICD-10-CM | POA: Diagnosis not present

## 2019-01-05 DIAGNOSIS — E1122 Type 2 diabetes mellitus with diabetic chronic kidney disease: Secondary | ICD-10-CM | POA: Diagnosis not present

## 2019-01-05 DIAGNOSIS — I482 Chronic atrial fibrillation, unspecified: Secondary | ICD-10-CM | POA: Diagnosis not present

## 2019-01-05 DIAGNOSIS — N183 Chronic kidney disease, stage 3 (moderate): Secondary | ICD-10-CM | POA: Diagnosis not present

## 2019-01-05 DIAGNOSIS — S31109D Unspecified open wound of abdominal wall, unspecified quadrant without penetration into peritoneal cavity, subsequent encounter: Secondary | ICD-10-CM | POA: Diagnosis not present

## 2019-01-05 DIAGNOSIS — J449 Chronic obstructive pulmonary disease, unspecified: Secondary | ICD-10-CM | POA: Diagnosis not present

## 2019-01-05 DIAGNOSIS — Z87440 Personal history of urinary (tract) infections: Secondary | ICD-10-CM | POA: Diagnosis not present

## 2019-01-05 DIAGNOSIS — I5032 Chronic diastolic (congestive) heart failure: Secondary | ICD-10-CM | POA: Diagnosis not present

## 2019-01-05 DIAGNOSIS — E1165 Type 2 diabetes mellitus with hyperglycemia: Secondary | ICD-10-CM | POA: Diagnosis not present

## 2019-01-05 NOTE — Addendum Note (Signed)
Addended by: Landis Gandy on: 01/05/2019 04:49 PM   Modules accepted: Orders

## 2019-01-05 NOTE — Telephone Encounter (Signed)
Yes, please make a referral to the wound care center.

## 2019-01-05 NOTE — Telephone Encounter (Addendum)
Referral order placed, left message notifying wound care center of referral. Notified [facility] Nurse Janett Billow. She states she also recollected urine today, dropped it to [employer] this afternoon 4PM. Landis Gandy, RN

## 2019-01-05 NOTE — Telephone Encounter (Signed)
Received calls from multiple Advanced Nurses requesting results of UA/culture.  RN requested this from [place]. Culture was not done as the sample was not a clean catch.  [facility] RN will go out and recollect. Janett Billow, 2nd RN from [facility] is calling for advice on patient's abdominal wound.  Per Janett Billow, wound is larger and now extends into subcutaneous fat. She is asking for referral to wound care. Please advise. Landis Gandy, RN

## 2019-01-06 ENCOUNTER — Telehealth: Payer: Self-pay | Admitting: *Deleted

## 2019-01-06 NOTE — Telephone Encounter (Signed)
Received clean catch urine analysis, being sent for culture.  Lab results in Dr Hale Bogus box. Landis Gandy, RN

## 2019-01-08 DIAGNOSIS — S7292XA Unspecified fracture of left femur, initial encounter for closed fracture: Secondary | ICD-10-CM | POA: Diagnosis not present

## 2019-01-08 DIAGNOSIS — Z9181 History of falling: Secondary | ICD-10-CM | POA: Diagnosis not present

## 2019-01-10 ENCOUNTER — Encounter (HOSPITAL_COMMUNITY): Payer: Self-pay | Admitting: *Deleted

## 2019-01-10 ENCOUNTER — Other Ambulatory Visit: Payer: Self-pay

## 2019-01-10 ENCOUNTER — Inpatient Hospital Stay (HOSPITAL_COMMUNITY)
Admission: EM | Admit: 2019-01-10 | Discharge: 2019-01-16 | DRG: 291 | Disposition: A | Discharge status: Home Health | Payer: BC Managed Care – PPO | Attending: Internal Medicine | Admitting: Internal Medicine

## 2019-01-10 ENCOUNTER — Emergency Department (HOSPITAL_COMMUNITY): Payer: BC Managed Care – PPO

## 2019-01-10 DIAGNOSIS — E1122 Type 2 diabetes mellitus with diabetic chronic kidney disease: Secondary | ICD-10-CM | POA: Diagnosis present

## 2019-01-10 DIAGNOSIS — R001 Bradycardia, unspecified: Secondary | ICD-10-CM | POA: Diagnosis present

## 2019-01-10 DIAGNOSIS — S31109A Unspecified open wound of abdominal wall, unspecified quadrant without penetration into peritoneal cavity, initial encounter: Secondary | ICD-10-CM | POA: Diagnosis not present

## 2019-01-10 DIAGNOSIS — S31109S Unspecified open wound of abdominal wall, unspecified quadrant without penetration into peritoneal cavity, sequela: Secondary | ICD-10-CM | POA: Diagnosis not present

## 2019-01-10 DIAGNOSIS — L98492 Non-pressure chronic ulcer of skin of other sites with fat layer exposed: Secondary | ICD-10-CM

## 2019-01-10 DIAGNOSIS — I071 Rheumatic tricuspid insufficiency: Secondary | ICD-10-CM | POA: Diagnosis present

## 2019-01-10 DIAGNOSIS — N183 Chronic kidney disease, stage 3 unspecified: Secondary | ICD-10-CM | POA: Diagnosis present

## 2019-01-10 DIAGNOSIS — Z833 Family history of diabetes mellitus: Secondary | ICD-10-CM

## 2019-01-10 DIAGNOSIS — N83291 Other ovarian cyst, right side: Secondary | ICD-10-CM | POA: Diagnosis not present

## 2019-01-10 DIAGNOSIS — R7989 Other specified abnormal findings of blood chemistry: Secondary | ICD-10-CM | POA: Diagnosis not present

## 2019-01-10 DIAGNOSIS — E1165 Type 2 diabetes mellitus with hyperglycemia: Secondary | ICD-10-CM | POA: Diagnosis present

## 2019-01-10 DIAGNOSIS — I248 Other forms of acute ischemic heart disease: Secondary | ICD-10-CM | POA: Diagnosis not present

## 2019-01-10 DIAGNOSIS — IMO0002 Reserved for concepts with insufficient information to code with codable children: Secondary | ICD-10-CM | POA: Diagnosis present

## 2019-01-10 DIAGNOSIS — E1110 Type 2 diabetes mellitus with ketoacidosis without coma: Secondary | ICD-10-CM | POA: Diagnosis not present

## 2019-01-10 DIAGNOSIS — F32A Depression, unspecified: Secondary | ICD-10-CM | POA: Diagnosis present

## 2019-01-10 DIAGNOSIS — Z20828 Contact with and (suspected) exposure to other viral communicable diseases: Secondary | ICD-10-CM | POA: Diagnosis present

## 2019-01-10 DIAGNOSIS — L98499 Non-pressure chronic ulcer of skin of other sites with unspecified severity: Secondary | ICD-10-CM | POA: Diagnosis present

## 2019-01-10 DIAGNOSIS — I5033 Acute on chronic diastolic (congestive) heart failure: Secondary | ICD-10-CM | POA: Diagnosis present

## 2019-01-10 DIAGNOSIS — Z803 Family history of malignant neoplasm of breast: Secondary | ICD-10-CM

## 2019-01-10 DIAGNOSIS — I5031 Acute diastolic (congestive) heart failure: Secondary | ICD-10-CM

## 2019-01-10 DIAGNOSIS — D631 Anemia in chronic kidney disease: Secondary | ICD-10-CM | POA: Diagnosis not present

## 2019-01-10 DIAGNOSIS — J449 Chronic obstructive pulmonary disease, unspecified: Secondary | ICD-10-CM | POA: Diagnosis not present

## 2019-01-10 DIAGNOSIS — R1031 Right lower quadrant pain: Secondary | ICD-10-CM | POA: Diagnosis not present

## 2019-01-10 DIAGNOSIS — R069 Unspecified abnormalities of breathing: Secondary | ICD-10-CM | POA: Diagnosis not present

## 2019-01-10 DIAGNOSIS — I130 Hypertensive heart and chronic kidney disease with heart failure and stage 1 through stage 4 chronic kidney disease, or unspecified chronic kidney disease: Principal | ICD-10-CM | POA: Diagnosis present

## 2019-01-10 DIAGNOSIS — Z6838 Body mass index (BMI) 38.0-38.9, adult: Secondary | ICD-10-CM

## 2019-01-10 DIAGNOSIS — I5032 Chronic diastolic (congestive) heart failure: Secondary | ICD-10-CM | POA: Diagnosis present

## 2019-01-10 DIAGNOSIS — I10 Essential (primary) hypertension: Secondary | ICD-10-CM | POA: Diagnosis not present

## 2019-01-10 DIAGNOSIS — R112 Nausea with vomiting, unspecified: Secondary | ICD-10-CM

## 2019-01-10 DIAGNOSIS — Z66 Do not resuscitate: Secondary | ICD-10-CM | POA: Diagnosis present

## 2019-01-10 DIAGNOSIS — F329 Major depressive disorder, single episode, unspecified: Secondary | ICD-10-CM | POA: Diagnosis present

## 2019-01-10 DIAGNOSIS — R0602 Shortness of breath: Secondary | ICD-10-CM | POA: Diagnosis not present

## 2019-01-10 DIAGNOSIS — Z7901 Long term (current) use of anticoagulants: Secondary | ICD-10-CM

## 2019-01-10 DIAGNOSIS — N179 Acute kidney failure, unspecified: Secondary | ICD-10-CM | POA: Diagnosis present

## 2019-01-10 DIAGNOSIS — I509 Heart failure, unspecified: Secondary | ICD-10-CM

## 2019-01-10 DIAGNOSIS — D509 Iron deficiency anemia, unspecified: Secondary | ICD-10-CM | POA: Diagnosis present

## 2019-01-10 DIAGNOSIS — E11622 Type 2 diabetes mellitus with other skin ulcer: Secondary | ICD-10-CM | POA: Diagnosis present

## 2019-01-10 DIAGNOSIS — I361 Nonrheumatic tricuspid (valve) insufficiency: Secondary | ICD-10-CM | POA: Diagnosis not present

## 2019-01-10 DIAGNOSIS — Z993 Dependence on wheelchair: Secondary | ICD-10-CM

## 2019-01-10 DIAGNOSIS — R531 Weakness: Secondary | ICD-10-CM | POA: Diagnosis not present

## 2019-01-10 DIAGNOSIS — I480 Paroxysmal atrial fibrillation: Secondary | ICD-10-CM | POA: Diagnosis not present

## 2019-01-10 DIAGNOSIS — I440 Atrioventricular block, first degree: Secondary | ICD-10-CM | POA: Diagnosis present

## 2019-01-10 DIAGNOSIS — R778 Other specified abnormalities of plasma proteins: Secondary | ICD-10-CM

## 2019-01-10 DIAGNOSIS — Z87440 Personal history of urinary (tract) infections: Secondary | ICD-10-CM

## 2019-01-10 DIAGNOSIS — K219 Gastro-esophageal reflux disease without esophagitis: Secondary | ICD-10-CM | POA: Diagnosis not present

## 2019-01-10 DIAGNOSIS — Z8614 Personal history of Methicillin resistant Staphylococcus aureus infection: Secondary | ICD-10-CM

## 2019-01-10 DIAGNOSIS — J90 Pleural effusion, not elsewhere classified: Secondary | ICD-10-CM | POA: Diagnosis not present

## 2019-01-10 DIAGNOSIS — Z794 Long term (current) use of insulin: Secondary | ICD-10-CM

## 2019-01-10 DIAGNOSIS — I517 Cardiomegaly: Secondary | ICD-10-CM | POA: Diagnosis not present

## 2019-01-10 DIAGNOSIS — Z823 Family history of stroke: Secondary | ICD-10-CM

## 2019-01-10 HISTORY — DX: Unspecified urinary incontinence: R32

## 2019-01-10 HISTORY — DX: Dependence on wheelchair: Z99.3

## 2019-01-10 LAB — COMPREHENSIVE METABOLIC PANEL
ALT: 18 U/L (ref 0–44)
AST: 31 U/L (ref 15–41)
Albumin: 3.1 g/dL — ABNORMAL LOW (ref 3.5–5.0)
Alkaline Phosphatase: 128 U/L — ABNORMAL HIGH (ref 38–126)
Anion gap: 20 — ABNORMAL HIGH (ref 5–15)
BUN: 28 mg/dL — ABNORMAL HIGH (ref 8–23)
CO2: 16 mmol/L — ABNORMAL LOW (ref 22–32)
Calcium: 8.8 mg/dL — ABNORMAL LOW (ref 8.9–10.3)
Chloride: 98 mmol/L (ref 98–111)
Creatinine, Ser: 1.56 mg/dL — ABNORMAL HIGH (ref 0.44–1.00)
GFR calc Af Amer: 39 mL/min — ABNORMAL LOW (ref >60)
GFR calc non Af Amer: 34 mL/min — ABNORMAL LOW (ref >60)
Glucose, Bld: 331 mg/dL — ABNORMAL HIGH (ref 70–99)
Potassium: 4.8 mmol/L (ref 3.5–5.1)
Sodium: 134 mmol/L — ABNORMAL LOW (ref 135–145)
Total Bilirubin: 1.6 mg/dL — ABNORMAL HIGH (ref 0.3–1.2)
Total Protein: 6.7 g/dL (ref 6.5–8.1)

## 2019-01-10 LAB — CBC WITH DIFFERENTIAL/PLATELET
Abs Immature Granulocytes: 0.12 10*3/uL — ABNORMAL HIGH (ref 0.00–0.07)
Basophils Absolute: 0 10*3/uL (ref 0.0–0.1)
Basophils Relative: 0 %
Eosinophils Absolute: 0 10*3/uL (ref 0.0–0.5)
Eosinophils Relative: 0 %
HCT: 27.4 % — ABNORMAL LOW (ref 36.0–46.0)
Hemoglobin: 7.8 g/dL — ABNORMAL LOW (ref 12.0–15.0)
Immature Granulocytes: 1 %
Lymphocytes Relative: 7 %
Lymphs Abs: 0.7 10*3/uL (ref 0.7–4.0)
MCH: 25.3 pg — ABNORMAL LOW (ref 26.0–34.0)
MCHC: 28.5 g/dL — ABNORMAL LOW (ref 30.0–36.0)
MCV: 89 fL (ref 80.0–100.0)
Monocytes Absolute: 0.8 10*3/uL (ref 0.1–1.0)
Monocytes Relative: 8 %
Neutro Abs: 8.6 10*3/uL — ABNORMAL HIGH (ref 1.7–7.7)
Neutrophils Relative %: 84 %
Platelets: 346 10*3/uL (ref 150–400)
RBC: 3.08 MIL/uL — ABNORMAL LOW (ref 3.87–5.11)
RDW: 15.6 % — ABNORMAL HIGH (ref 11.5–15.5)
WBC: 10.4 10*3/uL (ref 4.0–10.5)
nRBC: 0.3 % — ABNORMAL HIGH (ref 0.0–0.2)

## 2019-01-10 LAB — TROPONIN I
Troponin I: 0.29 ng/mL (ref <0.03)
Troponin I: 0.25 ng/mL (ref <0.03)

## 2019-01-10 LAB — URINALYSIS, ROUTINE W REFLEX MICROSCOPIC
Bilirubin Urine: NEGATIVE
Glucose, UA: NEGATIVE mg/dL
Ketones, ur: NEGATIVE mg/dL
Nitrite: NEGATIVE
Protein, ur: 100 mg/dL — AB
Specific Gravity, Urine: 1.016 (ref 1.005–1.030)
WBC, UA: >50 WBC/hpf — ABNORMAL HIGH (ref 0–5)
pH: 5 (ref 5.0–8.0)

## 2019-01-10 LAB — BASIC METABOLIC PANEL
Anion gap: 15 (ref 5–15)
BUN: 28 mg/dL — ABNORMAL HIGH (ref 8–23)
CO2: 20 mmol/L — ABNORMAL LOW (ref 22–32)
Calcium: 8.4 mg/dL — ABNORMAL LOW (ref 8.9–10.3)
Chloride: 98 mmol/L (ref 98–111)
Creatinine, Ser: 1.55 mg/dL — ABNORMAL HIGH (ref 0.44–1.00)
GFR calc Af Amer: 39 mL/min — ABNORMAL LOW (ref >60)
GFR calc non Af Amer: 34 mL/min — ABNORMAL LOW (ref >60)
Glucose, Bld: 295 mg/dL — ABNORMAL HIGH (ref 70–99)
Potassium: 4.4 mmol/L (ref 3.5–5.1)
Sodium: 133 mmol/L — ABNORMAL LOW (ref 135–145)

## 2019-01-10 LAB — BLOOD GAS, VENOUS
Acid-base deficit: 1.7 mmol/L (ref 0.0–2.0)
Bicarbonate: 22.6 mmol/L (ref 20.0–28.0)
FIO2: 21
O2 Saturation: 76.6 %
Patient temperature: 36.4
pCO2, Ven: 39.6 mmHg — ABNORMAL LOW (ref 44.0–60.0)
pH, Ven: 7.376 (ref 7.250–7.430)
pO2, Ven: 48.2 mmHg — ABNORMAL HIGH (ref 32.0–45.0)

## 2019-01-10 LAB — SARS CORONAVIRUS 2 BY RT PCR (HOSPITAL ORDER, PERFORMED IN ~~LOC~~ HOSPITAL LAB): SARS Coronavirus 2: NEGATIVE

## 2019-01-10 LAB — BRAIN NATRIURETIC PEPTIDE: B Natriuretic Peptide: 2107 pg/mL — ABNORMAL HIGH (ref 0.0–100.0)

## 2019-01-10 LAB — LACTIC ACID, PLASMA
Lactic Acid, Venous: 6.2 mmol/L (ref 0.5–1.9)
Lactic Acid, Venous: 2.9 mmol/L (ref 0.5–1.9)

## 2019-01-10 LAB — LIPASE, BLOOD: Lipase: 17 U/L (ref 11–51)

## 2019-01-10 LAB — MAGNESIUM: Magnesium: 1.6 mg/dL — ABNORMAL LOW (ref 1.7–2.4)

## 2019-01-10 LAB — CBG MONITORING, ED: Glucose-Capillary: 278 mg/dL — ABNORMAL HIGH (ref 70–99)

## 2019-01-10 MED ORDER — INSULIN ASPART 100 UNIT/ML ~~LOC~~ SOLN: 0.0000 [IU] | Freq: Three times a day (TID) | SUBCUTANEOUS | Status: DC

## 2019-01-10 MED ORDER — ACETAMINOPHEN 325 MG PO TABS
650.0000 mg | ORAL_TABLET | Freq: Four times a day (QID) | ORAL | Status: DC | PRN
  Administered 2019-01-11 – 2019-01-15 (×5): 650 mg via ORAL
  Filled 2019-01-10 (×5): qty 2

## 2019-01-10 MED ORDER — ACETAMINOPHEN 650 MG RE SUPP: 650.0000 mg | Freq: Four times a day (QID) | RECTAL | Status: DC | PRN

## 2019-01-10 MED ORDER — HYDROCODONE-ACETAMINOPHEN 5-325 MG PO TABS
1.0000 | ORAL_TABLET | ORAL | Status: DC | PRN
  Administered 2019-01-12 – 2019-01-13 (×2): 1 via ORAL
  Administered 2019-01-14: 2 via ORAL
  Administered 2019-01-14: 1 via ORAL
  Administered 2019-01-15 (×2): 2 via ORAL
  Administered 2019-01-15: 1 via ORAL
  Administered 2019-01-15: 13:00:00 2 via ORAL
  Filled 2019-01-10 (×2): qty 1
  Filled 2019-01-10 (×2): qty 2
  Filled 2019-01-10: qty 1
  Filled 2019-01-10: qty 2
  Filled 2019-01-10: qty 1
  Filled 2019-01-10: qty 2

## 2019-01-10 MED ORDER — INSULIN ASPART 100 UNIT/ML ~~LOC~~ SOLN: 0.0000 [IU] | Freq: Every day | SUBCUTANEOUS | Status: DC

## 2019-01-10 MED ORDER — FUROSEMIDE 10 MG/ML IJ SOLN
40.0000 mg | Freq: Two times a day (BID) | INTRAMUSCULAR | Status: DC
  Administered 2019-01-11 – 2019-01-12 (×4): 40 mg via INTRAVENOUS
  Filled 2019-01-10 (×4): qty 4

## 2019-01-10 MED ORDER — BISACODYL 5 MG PO TBEC: 5.0000 mg | DELAYED_RELEASE_TABLET | Freq: Every day | ORAL | Status: DC | PRN

## 2019-01-10 MED ORDER — INSULIN ASPART 100 UNIT/ML ~~LOC~~ SOLN: 3.0000 [IU] | Freq: Three times a day (TID) | SUBCUTANEOUS | Status: DC

## 2019-01-10 MED ORDER — SODIUM CHLORIDE 0.9 % IV SOLN
1.0000 g | INTRAVENOUS | Status: AC
  Filled 2019-01-10: qty 1

## 2019-01-10 MED ORDER — SENNOSIDES-DOCUSATE SODIUM 8.6-50 MG PO TABS
1.0000 | ORAL_TABLET | Freq: Every evening | ORAL | Status: DC | PRN
  Filled 2019-01-10: qty 1

## 2019-01-10 MED ORDER — APIXABAN 5 MG PO TABS
5.0000 mg | ORAL_TABLET | Freq: Two times a day (BID) | ORAL | Status: DC
  Administered 2019-01-11: 5 mg via ORAL
  Filled 2019-01-10: qty 1

## 2019-01-10 MED ORDER — INSULIN REGULAR(HUMAN) IN NACL 100-0.9 UT/100ML-% IV SOLN: INTRAVENOUS | Status: DC

## 2019-01-10 MED ORDER — FAMOTIDINE IN NACL 20-0.9 MG/50ML-% IV SOLN
20.0000 mg | Freq: Once | INTRAVENOUS | Status: AC
  Administered 2019-01-10: 20 mg via INTRAVENOUS
  Filled 2019-01-10: qty 50

## 2019-01-10 MED ORDER — SODIUM CHLORIDE 0.9 % IV SOLN: 1.0000 g | Freq: Two times a day (BID) | INTRAVENOUS | Status: DC

## 2019-01-10 MED ORDER — SODIUM CHLORIDE 0.9 % IV SOLN
2.0000 g | Freq: Two times a day (BID) | INTRAVENOUS | Status: DC
  Filled 2019-01-10: qty 2

## 2019-01-10 MED ORDER — MAGNESIUM SULFATE 2 GM/50ML IV SOLN
2.0000 g | Freq: Once | INTRAVENOUS | Status: AC
  Administered 2019-01-10: 2 g via INTRAVENOUS
  Filled 2019-01-10: qty 50

## 2019-01-10 MED ORDER — DEXTROSE-NACL 5-0.45 % IV SOLN: INTRAVENOUS | Status: AC

## 2019-01-10 MED ORDER — SODIUM CHLORIDE 0.9 % IV BOLUS
1000.0000 mL | Freq: Once | INTRAVENOUS | Status: AC
  Administered 2019-01-10: 18:00:00 1000 mL via INTRAVENOUS

## 2019-01-10 MED ORDER — FUROSEMIDE 10 MG/ML IJ SOLN: 40.0000 mg | Freq: Three times a day (TID) | INTRAMUSCULAR | Status: DC

## 2019-01-10 MED ORDER — ALBUTEROL SULFATE (2.5 MG/3ML) 0.083% IN NEBU: 3.0000 mL | INHALATION_SOLUTION | Freq: Four times a day (QID) | RESPIRATORY_TRACT | Status: DC | PRN

## 2019-01-10 MED ORDER — IOHEXOL 300 MG/ML  SOLN
100.0000 mL | Freq: Once | INTRAMUSCULAR | Status: DC | PRN
  Administered 2019-01-10: 19:00:00 100 mL via INTRAVENOUS

## 2019-01-10 MED ORDER — SODIUM CHLORIDE 0.9 % IV SOLN: INTRAVENOUS | Status: AC

## 2019-01-10 NOTE — ED Triage Notes (Signed)
Patient brought in by EMS for weakness for 3 days, CBG 382, HR 39.

## 2019-01-10 NOTE — Progress Notes (Signed)
Pharmacy Antibiotic Note  [name] is a 68 y.o. female admitted on 01/10/2019 with wound infection suspicious  ESBL.  Pharmacy has been consulted for  meropenem dosing.    Plan:  Give meropenem 1g IV q1h x2 doses to = total dose of 2g  Start meropenem 2g IV q12h Pharmacy will continue to monitor labs, vitals, cultures and patient progress.  Height: 5' 5"  (165.1 cm) Weight: 233 lb (105.7 kg) IBW/kg (Calculated) : 57  Temp (24hrs), Avg:97.6 F (36.4 C), Min:97.6 F (36.4 C), Max:97.6 F (36.4 C)  Recent Labs  Lab 01/10/19 1646 01/10/19 1834 01/10/19 2020  WBC 10.4  --   --   CREATININE 1.56*  --  1.55*  LATICACIDVEN 6.2* 2.9*  --     Estimated Creatinine Clearance: 42 mL/min (A) (by C-G formula based on SCr of 1.55 mg/dL (H)).    Allergies  Allergen Reactions  . Propranolol Swelling    Pt states it may have been leg swelling  . Topamax [Topiramate] Other (See Comments)    hallucinations  . Latex Itching and Rash    Antimicrobials this admission: meropenem 5/25 >>      Microbiology results:  5/25 UCx: pend 5/25 SARS-CoV-2   Thank you for allowing pharmacy to be a part of this patient's care.  Despina Pole, Pharm. D. Clinical Pharmacist 01/10/2019 10:00 PM

## 2019-01-10 NOTE — ED Notes (Signed)
Date and time results received: 01/10/19 7:23 PM  (use smartphrase ".now" to insert current time)  Test: lactic Critical Value: 2.9  Name of Provider Notified: [email or web address]   Orders Received? Or Actions Taken?:

## 2019-01-10 NOTE — ED Notes (Signed)
Date and time results received: 01/10/19 1731 (use smartphrase ".now" to insert current time)  Test: Lactic Acid/Troponin Critical Value: Lactic Acid 6.2/Troponin 0.29  Name of Provider Notified: Dr Thurnell Garbe  Orders Received? Or Actions Taken?: Na

## 2019-01-10 NOTE — Consult Note (Signed)
Cardiology Consultation Note    Patient ID: [name], MRN: [id number], DOB/AGE: 1950/10/04 68 y.o. Admit date: 01/10/2019   Date of Consult: 01/10/2019 Primary Physician: [name], MD  Reason for Consultation: Bradycardia, ECG changes, abnormal troponin Requesting MD: Dr. Reesa Chew  HPI: [name] is a 68 y.o. female with a history of HFpEF, DM2, AF on Eliquis, chronic UTI, chronic nonhealing abdominal wound, morbid obesity.  For the past 10 days or so, pt has noted significant dyspnea with minimal exertion.  This was accompanied by bilateral lower extremity edema and new bradycardia.  She has had significant nausea as well.  Given ongoing dyspnea, progressive fatigue and a low heart rate (in 30s-40s), the patient presented to the [facility] ED for evaluation.  In the ED, initial VS were norable for mild bradycardia to low 50s, but otherwise were WNL.  ECG showed, sinus bradycardia, striking anterior TWI and prolonged QT.  Labs were notable for Hb 7.8 (at baseline), Cr 1.55 (from 0.8), lactate 6.2, troponins of 0.29 followed by 0.25.  CT A/P showed chronic anterior abdominal wall wound without any e/o deeper infection.  Past Medical History:  Diagnosis Date   Atrial fibrillation (HCC)    CHF (congestive heart failure) ([place])    Depression    Diabetes mellitus without complication ([place])    History of transesophageal echocardiography (TEE) 03/2018   LV thrombus   Hypertension    Morbid obesity ([place])    Osteoporosis    Urinary incontinence    UTI (lower urinary tract infection) 01/2016   Cipro for Klebsiella pneumoniae isolate   Wheelchair bound    bedbound      Surgical History:  Past Surgical History:  Procedure Laterality Date   ABDOMINAL HYSTERECTOMY     BIOPSY  09/06/2018   Procedure: BIOPSY;  Surgeon: Danie Binder, MD;  Location: [facility];  Service: Endoscopy;;  gastric bx's   CESAREAN SECTION     CHOLECYSTECTOMY     ESOPHAGEAL DILATION   09/06/2018   Procedure: ESOPHAGEAL DILATION;  Surgeon: Danie Binder, MD;  Location: [facility];  Service: Endoscopy;;   ESOPHAGOGASTRODUODENOSCOPY (EGD) WITH PROPOFOL N/A 09/06/2018   Procedure: ESOPHAGOGASTRODUODENOSCOPY (EGD) WITH PROPOFOL;  Surgeon: Danie Binder, MD;  Location: [facility];  Service: Endoscopy;  Laterality: N/A;  dilatation   FEMUR IM NAIL Left 02/20/2016   FEMUR IM NAIL Left 02/20/2016   Procedure: INTRAMEDULLARY (IM) RETROGRADE FEMORAL NAILING;  Surgeon: Leandrew Koyanagi, MD;  Location: [place];  Service: Orthopedics;  Laterality: Left;   PANCREAS SURGERY  1967   1 cyst excised and one cyst drained   TEE WITHOUT CARDIOVERSION N/A 04/05/2018   Procedure: TRANSESOPHAGEAL ECHOCARDIOGRAM (TEE);  Surgeon: Dorothy Spark, MD;  Location: [facility];  Service: Cardiovascular;  Laterality: N/A;   VEIN LIGATION AND STRIPPING     WRIST FRACTURE SURGERY       Home Meds: Prior to Admission medications   Medication Sig Start Date End Date Taking? Authorizing Provider  acetaminophen (TYLENOL) 500 MG tablet Take 500-1,000 mg by mouth every 8 (eight) hours as needed for mild pain or headache.    Yes [provider]  albuterol (PROVENTIL HFA;VENTOLIN HFA) 108 (90 Base) MCG/ACT inhaler Inhale 2 puffs into the lungs every 6 (six) hours as needed for wheezing or shortness of breath. Patient taking differently: Inhale 2 puffs into the lungs every 6 (six) hours as needed for wheezing or shortness of breath (As needed).  08/14/18  Yes Kathie Dike, MD  apixaban (ELIQUIS) 5 MG TABS tablet Take 1 tablet (5 mg total) by mouth 2 (two) times daily. 07/21/18  Yes Herminio Commons, MD  diltiazem (CARDIZEM CD) 120 MG 24 hr capsule Take 1 capsule (120 mg total) by mouth daily. Patient taking differently: Take 120 mg by mouth daily. Cartia XT 09/10/18  Yes Johnson, Clanford L, MD  escitalopram (LEXAPRO) 10 MG tablet Take 10 mg by mouth daily. 12/28/18  Yes [provider]  fluticasone (FLONASE) 50 MCG/ACT nasal spray Place 2 sprays into both nostrils daily as needed for allergies or rhinitis (congestion).    Yes [provider]  furosemide (LASIX) 20 MG tablet Take 20 mg by mouth daily. 10/30/18  Yes [provider]  glipiZIDE (GLUCOTROL) 10 MG tablet Take 10 mg by mouth 2 (two) times daily. 03/02/18  Yes [provider]  LEVEMIR FLEXTOUCH 100 UNIT/ML Pen Inject 10 Units into the skin 2 (two) times a day. 01/05/19  Yes [provider]  metoprolol tartrate (LOPRESSOR) 25 MG tablet Take 1 tablet (25 mg total) by mouth 2 (two) times daily. Patient taking differently: Take 50 mg by mouth every morning.  09/10/18  Yes Johnson, Clanford L, MD  nystatin (MYCOSTATIN/NYSTOP) powder Apply topically daily as needed (rash in skin folds).   Yes [provider]  OVER THE COUNTER MEDICATION Apply 1 application topically daily as needed (rash in skin folds). Baby butt paste   Yes [provider]  pantoprazole (PROTONIX) 40 MG tablet Take 1 tablet (40 mg total) by mouth daily before breakfast. Patient taking differently: Take 40 mg by mouth daily as needed (for GERD/acid reflux).  09/11/18  Yes Johnson, Clanford L, MD  pioglitazone (ACTOS) 45 MG tablet Take 45 mg by mouth daily. 10/30/18  Yes [provider]    Inpatient Medications:   apixaban  5 mg Oral BID   furosemide  40 mg Intravenous BID    insulin     meropenem (MERREM) IV     [START ON 01/11/2019] meropenem (MERREM) IV      Allergies:  Allergies  Allergen Reactions   Propranolol Swelling    Pt states it may have been leg swelling   Topamax [Topiramate] Other (See Comments)    hallucinations   Latex Itching and Rash    Social History   Socioeconomic History   Marital status: Married    Spouse name: Not on file   Number of children: Not on file   Years of education: Not on file   Highest education level: Not on file  Occupational  History   Occupation: retired  Scientist, product/process development strain: Not on file   Food insecurity:    Worry: Not on file    Inability: Not on Lexicographer needs:    Medical: Not on file    Non-medical: Not on file  Tobacco Use   Smoking status: Never Smoker   Smokeless tobacco: Never Used  Substance and Sexual Activity   Alcohol use: No   Drug use: No   Sexual activity: Not on file  Lifestyle   Physical activity:    Days per week: Not on file    Minutes per session: Not on file   Stress: Not on file  Relationships   Social connections:    Talks on phone: Not on file    Gets together: Not on file    Attends religious service: Not on file  Active member of club or organization: Not on file    Attends meetings of clubs or organizations: Not on file    Relationship status: Not on file   Intimate partner violence:    Fear of current or ex partner: Not on file    Emotionally abused: Not on file    Physically abused: Not on file    Forced sexual activity: Not on file  Other Topics Concern   Not on file  Social History Narrative   Lives in [place] with husband.  More or less w/c bound since leg fx about 3 yrs ago.     Family History  Problem Relation Age of Onset   Diabetes Mother        died in her 41's of a stroke   Cancer Mother    Stroke Mother    Breast cancer Mother    Diabetes Father        died in his 71's of a stroke.   Stroke Father    Diabetes Brother        died @ 22 of a stroke.   Stroke Brother    Diabetes Maternal Grandmother    Diabetes Maternal Grandfather    Diabetes Paternal Grandmother    Diabetes Paternal Grandfather    Breast cancer Maternal Aunt      Review of Systems: All other systems reviewed and are otherwise negative except as noted above.  Labs: Recent Labs    01/10/19 1646 01/10/19 2020  TROPONINI 0.29* 0.25*   Lab Results  Component Value Date   WBC 10.4 01/10/2019   HGB 7.8  (L) 01/10/2019   HCT 27.4 (L) 01/10/2019   MCV 89.0 01/10/2019   PLT 346 01/10/2019    Recent Labs  Lab 01/10/19 1646 01/10/19 2020  NA 134* 133*  K 4.8 4.4  CL 98 98  CO2 16* 20*  BUN 28* 28*  CREATININE 1.56* 1.55*  CALCIUM 8.8* 8.4*  PROT 6.7  --   BILITOT 1.6*  --   ALKPHOS 128*  --   ALT 18  --   AST 31  --   GLUCOSE 331* 295*   Lab Results  Component Value Date   CHOL 98 04/04/2018   HDL 50 04/04/2018   LDLCALC 30 04/04/2018   TRIG 90 04/04/2018   No results found for: DDIMER  Radiology/Studies:  Ct Abdomen Pelvis Wo Contrast  Result Date: 01/10/2019 CLINICAL DATA:  3 day history of weakness with superficial wounds in the left lower quadrant abdominal wall. EXAM: CT ABDOMEN AND PELVIS WITHOUT CONTRAST TECHNIQUE: Multidetector CT imaging of the abdomen and pelvis was performed following the standard protocol without IV contrast. COMPARISON:  12/05/2018 FINDINGS: Lower chest: Tiny left and small right pleural effusions with dependent atelectasis in the lower lobes bilaterally. Hepatobiliary: No focal abnormality in the liver on this study without intravenous contrast. The liver shows diffusely decreased attenuation suggesting steatosis. Gallbladder is surgically absent. Extrahepatic common duct is dilated up to 17 mm diameter which is stable to 17 mm on the prior study. Pancreas: Diffusely atrophic without main duct dilatation. Spleen: No splenomegaly. No focal mass lesion. Adrenals/Urinary Tract: No adrenal nodule or mass. Kidneys are atrophic bilaterally without hydronephrosis. No evidence for hydroureter. Prominent gas volume noted in the urinary bladder. Stomach/Bowel: Stomach is unremarkable. No gastric wall thickening. No evidence of outlet obstruction. Duodenum is normally positioned as is the ligament of Treitz. No small bowel wall thickening. No small bowel dilatation. The terminal ileum is  normal. The appendix is not visualized, but there is no edema or inflammation  in the region of the cecum. No gross colonic mass. No colonic wall thickening. Diverticular changes are noted in the left colon without evidence of diverticulitis. Vascular/Lymphatic: There is abdominal aortic atherosclerosis without aneurysm. There is no gastrohepatic or hepatoduodenal ligament lymphadenopathy. No intraperitoneal or retroperitoneal lymphadenopathy. No pelvic sidewall lymphadenopathy. Reproductive: The uterus is surgically absent. No left adnexal mass. 4.3 cm benign appearing cystic lesion in the right adnexal space is stable in the interval. Other: No intraperitoneal free fluid. There is some edema in the porta hepatis and along the inferior right liver. Musculoskeletal: Diffuse body wall edema noted. Skin thickening noted lower anterior abdominal wall with superficial gas in the subcutaneous fat identified in the right lower quadrant of the anterior abdominal wall. Bones are diffusely demineralized. No worrisome lytic or sclerotic osseous abnormality. IMPRESSION: 1. Minimal edema identified in the hepato duodenal ligament/porta hepatis. Etiology not evident on this study, but duodenitis and pancreatitis not excluded. 2. Large gas volume in the urinary bladder. In the absence of recent bladder instrumentation, infection should be considered. 3. Stable dilation of the extrahepatic common duct in this patient status post cholecystectomy. 4. Skin thickening lower anterior abdominal wall, right greater than left with subcutaneous superficial gas bubbles in the subcutaneous fat of the anterior right lower abdominal wall compatible with the reported wound. 5. Stable appearance of benign appearing right ovarian cyst measuring 4.3 cm is stable back to 09/03/2018. While interval stability is reassuring, patient age and cysts size indicate follow-up pelvic ultrasound to further evaluate. This recommendation follows ACR consensus guidelines: White Paper of the ACR Incidental Findings Committee II on Adnexal  Findings. J Am Coll Radiol 2013:10:675-681. 6. Small right and tiny left pleural effusion with some dependent atelectasis in the lower lobes bilaterally. 7.  Aortic Atherosclerois (ICD10-170.0) Electronically Signed   By: Misty Stanley M.D.   On: 01/10/2019 19:33   Dg Chest Port 1 View  Result Date: 01/10/2019 CLINICAL DATA:  Weakness EXAM: PORTABLE CHEST 1 VIEW COMPARISON:  08/16/2018 FINDINGS: Low volume AP portable examination. Cardiomegaly with mild pulmonary vascular prominence but without overt edema. No focal airspace opacity. IMPRESSION: Low volume AP portable examination. Cardiomegaly with mild pulmonary vascular prominence but without overt edema. No focal airspace opacity. Electronically Signed   By: Eddie Candle M.D.   On: 01/10/2019 17:22    Wt Readings from Last 3 Encounters:  01/10/19 107.4 kg  12/07/18 96 kg  10/29/18 95.3 kg    EKG: Sinus bradycardia, deep anterior TWI, prolonged QTc  Physical Exam: Blood pressure (!) 150/53, pulse (!) 54, temperature 97.6 F (36.4 C), temperature source Oral, resp. rate 17, height 5' 5"  (1.651 m), weight 107.4 kg, SpO2 100 %. Body mass index is 39.4 kg/m. General: Morbidly obese, NAD Head: Normocephalic, atraumatic, sclera non-icteric, no xanthomas, nares are without discharge.  Neck: Negative for carotid bruits. JVD 12. Lungs: Decreased bs at bases  Heart: RRR with S1 S2. No murmurs, rubs, or gallops appreciated. Abdomen: Soft, non-tender, non-distended with normoactive bowel sounds. No hepatomegaly. No rebound/guarding. No obvious abdominal masses. Msk:  Strength and tone appear normal for age. Extremities: 2+ pitting edema bilaterally.  Distal pedal pulses are 2+ and equal bilaterally. Neuro: Alert and oriented X 3. No facial asymmetry. No focal deficit. Moves all extremities spontaneously. Psych:  Responds to questions appropriately with a normal affect.     Assessment and Plan  28F with HFpEF, AF, DM2, and  multiple other  medical comorbidities, who presents with generalized weakness, SOB and bradycardia.  It is possible that the pt had a missed ACS event 10 days ago at the onset of symptoms.  Other cardiac etiologies on DDx include Takotsubo's, and new sinus node dysfunction (primary bradyarrhythmia).  The reason for her profoundly elevated lactate upon presentation is not entirely clear.  For now, would plan for echocardiogram in the AM and IV diuretics.  Recommend holding Eliquis and starting IV heparin 12 hours after her most recent dose (in case of possibility for invasive procedures at some point later this week).  Depending on results of TTE, would consider cath if there are new WMAs.  Hold home metoprolol and monitor on telemetry to see if there are any other bradyarrhythmias aside from sinus bradycardia.  Cardiology will follow along with you.  Signed, Doylene Canning, MD 01/10/2019, 11:57 PM

## 2019-01-10 NOTE — ED Provider Notes (Signed)
[facility] EMERGENCY DEPARTMENT Provider Note   CSN: [id number] Arrival date & time: 01/10/19  1526    History   Chief Complaint Chief Complaint  Patient presents with   Weakness    HPI [name] is a 68 y.o. female.      Weakness    Pt was seen at 1535. Per pt, c/o gradual onset and worsening of persistent generalized weakness for the past 3 days. Has been associated with decreased PO intake, several episodes of N/V, and one episode of diarrhea. Pt states she also has had an intermittent cough. Pt states her nausea is the "worse" of all her symptoms. States she "just happened" to take her HR today with her home monitor and it was "very low." EMS states pt's HR was "39" on scene. Pt endorses hx of "chronic UTI's for the past year" as well as a right pannus wound "for 2 months" that are being managed by ID MD and PMD. Denies abd pain, no black or blood in stools or emesis, no rash, no fevers, no focal motor weakness, no CP/SOB, no palpitations.      Past Medical History:  Diagnosis Date   Atrial fibrillation (HCC)    CHF (congestive heart failure) ([place])    Depression    Diabetes mellitus without complication ([place])    History of transesophageal echocardiography (TEE) 03/2018   LV thrombus   Hypertension    Morbid obesity ([place])    Osteoporosis    Urinary incontinence    UTI (lower urinary tract infection) 01/2016   Cipro for Klebsiella pneumoniae isolate   Wheelchair bound    bedbound    Patient Active Problem List   Diagnosis Date Noted   Urinary incontinence 12/28/2018   Recurrent urinary tract infection 12/16/2018   Wound, open, anterior abdominal wall 12/05/2018   Hypotension due to hypovolemia    Gastritis due to nonsteroidal anti-inflammatory drug    Esophageal dysphagia    GI bleed 09/03/2018   Coagulopathy ([place])    Colitis    Lower abdominal pain    Rectal bleeding    Dehydration 08/16/2018   Acute on chronic diastolic  CHF (congestive heart failure) ([place]) 08/10/2018   Acute lower UTI 08/10/2018   Hypokalemia 08/10/2018   COPD (chronic obstructive pulmonary disease) ([place]) 08/10/2018   Thyroid lesion 08/10/2018   Closed fracture of right ankle 06/10/2018   Closed fracture of left tibia and fibula with routine healing 04/13/18 06/10/2018   Atrial fibrillation ([place]) 04/15/2018   Chronic diastolic CHF (congestive heart failure) ([place]) 04/15/2018   CKD (chronic kidney disease), stage III ([place]) 04/15/2018   Closed right fibular fracture 04/15/2018   Left tibial fracture 04/14/2018   CHF exacerbation ([place]) 04/10/2018   SOB (shortness of breath)    Acute CHF (congestive heart failure) ([place]) 04/01/2018   Atrial fibrillation with RVR ([place]) 04/01/2018   [place] 03/11/2016   Muscle spasms of lower extremity 03/04/2016   Acute renal insufficiency 02/26/2016   History of MRSA infection 02/26/2016   Diabetes type 2, uncontrolled ([place]) 02/19/2016   HTN (hypertension) 02/19/2016   Fracture, femur, distal ([place]) 02/19/2016   Fall 02/19/2016   Depression 02/19/2016   Closed left femoral fracture, initial encounter 02/19/2016   Femur fracture, left ([place]) 02/19/2016    Past Surgical History:  Procedure Laterality Date   ABDOMINAL HYSTERECTOMY     BIOPSY  09/06/2018   Procedure: BIOPSY;  Surgeon: Danie Binder, MD;  Location: [facility];  Service: Endoscopy;;  gastric bx's   CESAREAN SECTION     CHOLECYSTECTOMY     ESOPHAGEAL DILATION  09/06/2018   Procedure: ESOPHAGEAL DILATION;  Surgeon: Danie Binder, MD;  Location: [facility] ENDO [facility];  Service: Endoscopy;;   ESOPHAGOGASTRODUODENOSCOPY (EGD) WITH PROPOFOL N/A 09/06/2018   Procedure: ESOPHAGOGASTRODUODENOSCOPY (EGD) WITH PROPOFOL;  Surgeon: Danie Binder, MD;  Location: [facility] END[facility];  Service: Endoscopy;  Laterality: N/A;  dilatation   FEMUR IM NAIL Left 02/20/2016   FEMUR IM NAIL Left 02/20/2016   Procedure: INTRAMEDULLARY (IM)  RETROGRADE FEMORAL NAILING;  Surgeon: Leandrew Koyanagi, MD;  Location: [place];  Service: Orthopedics;  Laterality: Left;   PANCREAS SURGERY  1967   1 cyst excised and one cyst drained   TEE WITHOUT CARDIOVERSION N/A 04/05/2018   Procedure: TRANSESOPHAGEAL ECHOCARDIOGRAM (TEE);  Surgeon: Dorothy Spark, MD;  Location: [facility] ENDOSCOPY;  Service: Cardiovascular;  Laterality: N/A;   VEIN LIGATION AND STRIPPING     WRIST FRACTURE SURGERY       OB History   No obstetric history on file.      Home Medications    Prior to Admission medications   Medication Sig Start Date End Date Taking? Authorizing Provider  acetaminophen (TYLENOL) 500 MG tablet Take 1,000 mg by mouth every 8 (eight) hours as needed for mild pain or headache.     [provider]  albuterol (PROVENTIL HFA;VENTOLIN HFA) 108 (90 Base) MCG/ACT inhaler Inhale 2 puffs into the lungs every 6 (six) hours as needed for wheezing or shortness of breath. Patient taking differently: Inhale 2 puffs into the lungs every 6 (six) hours as needed for wheezing or shortness of breath (As needed).  08/14/18   Kathie Dike, MD  apixaban (ELIQUIS) 5 MG TABS tablet Take 1 tablet (5 mg total) by mouth 2 (two) times daily. 07/21/18   Herminio Commons, MD  diltiazem (CARDIZEM CD) 120 MG 24 hr capsule Take 1 capsule (120 mg total) by mouth daily. Patient taking differently: Take 120 mg by mouth daily. Cartia XT 09/10/18   Johnson, Clanford L, MD  escitalopram (LEXAPRO) 5 MG tablet Take 1 tablet (5 mg total) by mouth daily. 08/25/18 01/04/19  Manuella Ghazi, Pratik D, DO  ferrous sulfate 325 (65 FE) MG tablet Take 325 mg by mouth daily with breakfast.    [provider]  fluticasone (FLONASE) 50 MCG/ACT nasal spray Place 2 sprays into both nostrils daily as needed for allergies or rhinitis (congestion).     [provider]  furosemide (LASIX) 20 MG tablet Take 20 mg by mouth daily. 10/30/18   [provider]  glipiZIDE (GLUCOTROL)  10 MG tablet Take 10 mg by mouth 2 (two) times daily. 03/02/18   [provider]  metoprolol tartrate (LOPRESSOR) 25 MG tablet Take 1 tablet (25 mg total) by mouth 2 (two) times daily. 09/10/18   Johnson, Clanford L, MD  nystatin (MYCOSTATIN/NYSTOP) powder Apply topically daily as needed (rash in skin folds).    [provider]  OVER THE COUNTER MEDICATION Apply 1 application topically daily as needed (rash in skin folds). Baby butt paste    [provider]  pantoprazole (PROTONIX) 40 MG tablet Take 1 tablet (40 mg total) by mouth daily before breakfast. Patient taking differently: Take 40 mg by mouth daily as needed (for GERD/acid reflux).  09/11/18   Johnson, Clanford L, MD  pioglitazone (ACTOS) 45 MG tablet Take 45 mg by mouth daily. 10/30/18   [provider]    Family  History Family History  Problem Relation Age of Onset   Diabetes Mother        died in her 22's of a stroke   Cancer Mother    Stroke Mother    Breast cancer Mother    Diabetes Father        died in his 32's of a stroke.   Stroke Father    Diabetes Brother        died @ 56 of a stroke.   Stroke Brother    Diabetes Maternal Grandmother    Diabetes Maternal Grandfather    Diabetes Paternal Grandmother    Diabetes Paternal Grandfather    Breast cancer Maternal Aunt     Social History Social History   Tobacco Use   Smoking status: Never Smoker   Smokeless tobacco: Never Used  Substance Use Topics   Alcohol use: No   Drug use: No     Allergies   Propranolol; Topamax [topiramate]; and Latex   Review of Systems Review of Systems  Neurological: Positive for weakness.  ROS: Statement: All systems negative except as marked or noted in the HPI; Constitutional: Negative for fever and chills. +generalized weakness.; ; Eyes: Negative for eye pain, redness and discharge. ; ; ENMT: Negative for ear pain, hoarseness, nasal congestion, sinus pressure and sore throat. ;  ; Cardiovascular: +"low HR." Negative for chest pain, palpitations, diaphoresis, dyspnea and peripheral edema. ; ; Respiratory: +cough. Negative for wheezing and stridor. ; ; Gastrointestinal: +N/V/D, decreased PO intake. Negative for abdominal pain, blood in stool, hematemesis, jaundice and rectal bleeding. . ; ; Genitourinary: +"chronic UTI for the past year." Negative for dysuria, flank pain and hematuria. ; ; Musculoskeletal: Negative for back pain and neck pain. Negative for swelling and trauma.; ; Skin: +"abd wound x2 months." Negative for pruritus, rash, abrasions, blisters, bruising and skin lesion.; ; Neuro: Negative for headache, lightheadedness and neck stiffness. Negative for altered level of consciousness, altered mental status, extremity weakness, paresthesias, involuntary movement, seizure and syncope.        Physical Exam Updated Vital Signs BP 101/81 (BP Location: Right Arm)    Pulse (!) 41    Temp 97.6 F (36.4 C) (Oral)    Resp 16    Ht 5' 5"  (1.651 m)    Wt 105.7 kg    SpO2 100%    BMI 38.77 kg/m     Patient Vitals for the past 24 hrs:  BP Temp Temp src Pulse Resp SpO2 Height Weight  01/10/19 2000 (!) 136/120 -- -- -- 20 -- -- --  01/10/19 1930 (!) 142/61 -- -- -- 19 -- -- --  01/10/19 1915 -- -- -- -- (!) 22 -- -- --  01/10/19 1900 (!) 142/60 -- -- -- (!) 21 -- -- --  01/10/19 1845 -- -- -- (!) 50 (!) 22 94 % -- --  01/10/19 1830 (!) 156/74 -- -- (!) 52 (!) 23 100 % -- --  01/10/19 1600 (!) 119/96 -- -- (!) 52 (!) 22 92 % -- --  01/10/19 1530 -- 97.6 F (36.4 C) Oral -- -- -- -- --  01/10/19 1528 101/81 -- -- (!) 41 16 100 % -- --  01/10/19 1526 -- -- -- -- -- -- 5' 5"  (1.651 m) 105.7 kg  01/10/19 1524 -- -- Oral -- -- -- -- --      Physical Exam 1540: Physical examination:  Nursing notes reviewed; Vital signs and O2 SAT reviewed;  Constitutional: Well developed, Well  nourished, In no acute distress; Head:  Normocephalic, atraumatic; Eyes: EOMI, PERRL, No scleral  icterus; ENMT: Mouth and pharynx normal, Mucous membranes dry; Neck: Supple, Full range of motion, No lymphadenopathy; Cardiovascular: Bradycardic, intermittently irregular rate and rhythm, No gallop; Respiratory: Breath sounds clear & equal bilaterally, No wheezes.  Speaking full sentences with ease, Normal respiratory effort/excursion; Chest: Nontender, Movement normal; Abdomen: Large pannus: +open wound right lower pannus with foul smell, no erythema, no ecchymosis, no fluctuance.  Soft, Nontender, Nondistended, Normal bowel sounds; Genitourinary: No CVA tenderness; Extremities: Peripheral pulses normal, No tenderness, No edema, No calf edema or asymmetry.; Neuro: AA&Ox3, Major CN grossly intact. No facial droop. Speech clear. No gross focal motor deficits in extremities.; Skin: Color normal, Warm, Dry.     ED Treatments / Results  Labs (all labs ordered are listed, but only abnormal results are displayed)   EKG EKG Interpretation  Date/Time:  Monday Jan 10 2019 15:31:35 EDT Ventricular Rate:  104 PR Interval:    QRS Duration: 75 QT Interval:  287 QTC Calculation: 408 R Axis:   116 Text Interpretation:  Poor data quality Probable Sinus bradycardia Baseline wander Artifact SUGGEST REPEAT TRACING Confirmed by Francine Graven [phone]) on 01/10/2019 4:06:11 PM      EKG Interpretation  Date/Time:  Monday Jan 10 2019 16:01:34 EDT Ventricular Rate:  49 PR Interval:    QRS Duration: 98 QT Interval:  671 QTC Calculation: 606 R Axis:   63 Text Interpretation:  Bradycardia with irregular rate Low voltage, extremity leads Abnrm T, probable ischemia, anterolateral lds Prolonged QT interval Artifact When compared with ECG of 09/02/2018 Nonspecific T wave abnormality is now Present Rate slower Confirmed by Francine Graven [phone]) on 01/10/2019 4:09:04 PM          Radiology   Procedures Procedures (including critical care time)  Medications Ordered in ED Medications - No data to  display   Initial Impression / Assessment and Plan / ED Course  I have reviewed the triage vital signs and the nursing notes.  Pertinent labs & imaging results that were available during my care of the patient were reviewed by me and considered in my medical decision making (see chart for details).     MDM Reviewed: previous chart, nursing note and vitals Reviewed previous: labs and ECG Interpretation: labs, ECG, x-ray and CT scan Total time providing critical care: 30-74 minutes. This excludes time spent performing separately reportable procedures and services. Consults: admitting MD and cardiology   CRITICAL CARE Performed by: Francine Graven Total critical care time: 45 minutes Critical care time was exclusive of separately billable procedures and treating other patients. Critical care was necessary to treat or prevent imminent or life-threatening deterioration. Critical care was time spent personally by me on the following activities: development of treatment plan with patient and/or surrogate as well as nursing, discussions with consultants, evaluation of patient's response to treatment, examination of patient, obtaining history from patient or surrogate, ordering and performing treatments and interventions, ordering and review of laboratory studies, ordering and review of radiographic studies, pulse oximetry and re-evaluation of patient's condition.  Results for orders placed or performed during the hospital encounter of 01/10/19  SARS Coronavirus 2 (CEPHEID - Performed in [place] hospital lab), [facility] Order  Result Value Ref Range   SARS Coronavirus 2 NEGATIVE NEGATIVE  Comprehensive metabolic panel  Result Value Ref Range   Sodium 134 (L) 135 - 145 mmol/L   Potassium 4.8 3.5 - 5.1 mmol/L   Chloride 98 98 -  111 mmol/L   CO2 16 (L) 22 - 32 mmol/L   Glucose, Bld 331 (H) 70 - 99 mg/dL   BUN 28 (H) 8 - 23 mg/dL   Creatinine, Ser 1.56 (H) 0.44 - 1.00 mg/dL   Calcium 8.8 (L)  8.9 - 10.3 mg/dL   Total Protein 6.7 6.5 - 8.1 g/dL   Albumin 3.1 (L) 3.5 - 5.0 g/dL   AST 31 15 - 41 U/L   ALT 18 0 - 44 U/L   Alkaline Phosphatase 128 (H) 38 - 126 U/L   Total Bilirubin 1.6 (H) 0.3 - 1.2 mg/dL   GFR calc non Af Amer 34 (L) >60 mL/min   GFR calc Af Amer 39 (L) >60 mL/min   Anion gap 20 (H) 5 - 15  Lipase, blood  Result Value Ref Range   Lipase 17 11 - 51 U/L  Brain natriuretic peptide  Result Value Ref Range   B Natriuretic Peptide 2,107.0 (H) 0.0 - 100.0 pg/mL  Troponin I - Once  Result Value Ref Range   Troponin I 0.29 (HH) <0.03 ng/mL  Lactic acid, plasma  Result Value Ref Range   Lactic Acid, Venous 6.2 (HH) 0.5 - 1.9 mmol/L  Lactic acid, plasma  Result Value Ref Range   Lactic Acid, Venous 2.9 (HH) 0.5 - 1.9 mmol/L  CBC with Differential  Result Value Ref Range   WBC 10.4 4.0 - 10.5 K/uL   RBC 3.08 (L) 3.87 - 5.11 MIL/uL   Hemoglobin 7.8 (L) 12.0 - 15.0 g/dL   HCT 27.4 (L) 36.0 - 46.0 %   MCV 89.0 80.0 - 100.0 fL   MCH 25.3 (L) 26.0 - 34.0 pg   MCHC 28.5 (L) 30.0 - 36.0 g/dL   RDW 15.6 (H) 11.5 - 15.5 %   Platelets 346 150 - 400 K/uL   nRBC 0.3 (H) 0.0 - 0.2 %   Neutrophils Relative % 84 %   Neutro Abs 8.6 (H) 1.7 - 7.7 K/uL   Lymphocytes Relative 7 %   Lymphs Abs 0.7 0.7 - 4.0 K/uL   Monocytes Relative 8 %   Monocytes Absolute 0.8 0.1 - 1.0 K/uL   Eosinophils Relative 0 %   Eosinophils Absolute 0.0 0.0 - 0.5 K/uL   Basophils Relative 0 %   Basophils Absolute 0.0 0.0 - 0.1 K/uL   Immature Granulocytes 1 %   Abs Immature Granulocytes 0.12 (H) 0.00 - 0.07 K/uL  Urinalysis, Routine w reflex microscopic  Result Value Ref Range   Color, Urine AMBER (A) YELLOW   APPearance CLOUDY (A) CLEAR   Specific Gravity, Urine 1.016 1.005 - 1.030   pH 5.0 5.0 - 8.0   Glucose, UA NEGATIVE NEGATIVE mg/dL   Hgb urine dipstick MODERATE (A) NEGATIVE   Bilirubin Urine NEGATIVE NEGATIVE   Ketones, ur NEGATIVE NEGATIVE mg/dL   Protein, ur 100 (A) NEGATIVE mg/dL    Nitrite NEGATIVE NEGATIVE   Leukocytes,Ua LARGE (A) NEGATIVE   RBC / HPF 6-10 0 - 5 RBC/hpf   WBC, UA >50 (H) 0 - 5 WBC/hpf   Bacteria, UA MANY (A) NONE SEEN   Squamous Epithelial / LPF 0-5 0 - 5   WBC Clumps PRESENT    Mucus PRESENT    Hyaline Casts, UA PRESENT   Magnesium  Result Value Ref Range   Magnesium 1.6 (L) 1.7 - 2.4 mg/dL   Ct Abdomen Pelvis Wo Contrast Result Date: 01/10/2019 CLINICAL DATA:  3 day history of weakness with superficial wounds in  the left lower quadrant abdominal wall. EXAM: CT ABDOMEN AND PELVIS WITHOUT CONTRAST TECHNIQUE: Multidetector CT imaging of the abdomen and pelvis was performed following the standard protocol without IV contrast. COMPARISON:  12/05/2018 FINDINGS: Lower chest: Tiny left and small right pleural effusions with dependent atelectasis in the lower lobes bilaterally. Hepatobiliary: No focal abnormality in the liver on this study without intravenous contrast. The liver shows diffusely decreased attenuation suggesting steatosis. Gallbladder is surgically absent. Extrahepatic common duct is dilated up to 17 mm diameter which is stable to 17 mm on the prior study. Pancreas: Diffusely atrophic without main duct dilatation. Spleen: No splenomegaly. No focal mass lesion. Adrenals/Urinary Tract: No adrenal nodule or mass. Kidneys are atrophic bilaterally without hydronephrosis. No evidence for hydroureter. Prominent gas volume noted in the urinary bladder. Stomach/Bowel: Stomach is unremarkable. No gastric wall thickening. No evidence of outlet obstruction. Duodenum is normally positioned as is the ligament of Treitz. No small bowel wall thickening. No small bowel dilatation. The terminal ileum is normal. The appendix is not visualized, but there is no edema or inflammation in the region of the cecum. No gross colonic mass. No colonic wall thickening. Diverticular changes are noted in the left colon without evidence of diverticulitis. Vascular/Lymphatic: There  is abdominal aortic atherosclerosis without aneurysm. There is no gastrohepatic or hepatoduodenal ligament lymphadenopathy. No intraperitoneal or retroperitoneal lymphadenopathy. No pelvic sidewall lymphadenopathy. Reproductive: The uterus is surgically absent. No left adnexal mass. 4.3 cm benign appearing cystic lesion in the right adnexal space is stable in the interval. Other: No intraperitoneal free fluid. There is some edema in the porta hepatis and along the inferior right liver. Musculoskeletal: Diffuse body wall edema noted. Skin thickening noted lower anterior abdominal wall with superficial gas in the subcutaneous fat identified in the right lower quadrant of the anterior abdominal wall. Bones are diffusely demineralized. No worrisome lytic or sclerotic osseous abnormality. IMPRESSION: 1. Minimal edema identified in the hepato duodenal ligament/porta hepatis. Etiology not evident on this study, but duodenitis and pancreatitis not excluded. 2. Large gas volume in the urinary bladder. In the absence of recent bladder instrumentation, infection should be considered. 3. Stable dilation of the extrahepatic common duct in this patient status post cholecystectomy. 4. Skin thickening lower anterior abdominal wall, right greater than left with subcutaneous superficial gas bubbles in the subcutaneous fat of the anterior right lower abdominal wall compatible with the reported wound. 5. Stable appearance of benign appearing right ovarian cyst measuring 4.3 cm is stable back to 09/03/2018. While interval stability is reassuring, patient age and cysts size indicate follow-up pelvic ultrasound to further evaluate. This recommendation follows ACR consensus guidelines: White Paper of the ACR Incidental Findings Committee II on Adnexal Findings. J Am Coll Radiol 2013:10:675-681. 6. Small right and tiny left pleural effusion with some dependent atelectasis in the lower lobes bilaterally. 7.  Aortic Atherosclerois  (ICD10-170.0) Electronically Signed   By: Misty Stanley M.D.   On: 01/10/2019 19:33   Dg Chest Port 1 View Result Date: 01/10/2019 CLINICAL DATA:  Weakness EXAM: PORTABLE CHEST 1 VIEW COMPARISON:  08/16/2018 FINDINGS: Low volume AP portable examination. Cardiomegaly with mild pulmonary vascular prominence but without overt edema. No focal airspace opacity. IMPRESSION: Low volume AP portable examination. Cardiomegaly with mild pulmonary vascular prominence but without overt edema. No focal airspace opacity. Electronically Signed   By: Eddie Candle M.D.   On: 01/10/2019 17:22    [name] was evaluated in Emergency Department on 01/10/2019 for the symptoms described in  the history of present illness. She was evaluated in the context of the global COVID-19 pandemic, which necessitated consideration that the patient might be at risk for infection with the SARS-CoV-2 virus that causes COVID-19. Institutional protocols and algorithms that pertain to the evaluation of patients at risk for COVID-19 are in a state of rapid change based on information released by regulatory bodies including the CDC and federal and state organizations. These policies and algorithms were followed during the patient's care in the ED.    2000:   Initial lactic acid elevated, as well as BUN/Cr; judicious IVF given with improvement in lactic acid. The patient is noted to have a lactate>4. With the current information available to me, I don't think the patient is in septic shock. The lactate>4, is related to poor perfusion d/t bradycardia.  Pt with known chronic UTI, with last ID MD note this past month (12/28/18) questioning colonization and to wait for UC before proceeding with further abx, as well as pt needs Uro MD workup. UC is currently pending. BNP elevated, but no overt CHF on CXR. Troponin elevated with new NS STTW changes on EKG today; pt however denies CP/SOB. 2nd troponin pending.  T/C returned from [facility] Cards Dr. Percival Spanish,  case discussed, including:  HPI, pertinent PM/SHx, VS/PE, dx testing, ED course and treatment:  Agreeable to consult for ischemic workup when pt is transferred to [facility] under [facility] service for admit.   2025:  Pt remains bradycardic. BP improved after IVF bolus. T/C returned from [facility] Dr. Reesa Chew, case discussed, including:  HPI, pertinent PM/SHx, VS/PE, dx testing, ED course and treatment:  Agreeable to admit/transfer to [facility].          Final Clinical Impressions(s) / ED Diagnoses   Final diagnoses:  None    ED Discharge Orders    None       Francine Graven, DO 01/14/19 0932

## 2019-01-10 NOTE — ED Notes (Signed)
[facility] arrived

## 2019-01-10 NOTE — H&P (Addendum)
History and Physical    [name] JEH:[id number] DOB: 08-20-1950 DOA: 01/10/2019  PCP: [name], MD Patient coming from: Home  Chief Complaint: Generalized weakness and low heart rate  HPI: [name] is a 68 y.o. female with medical history significant of chronic atrial fibrillation on Eliquis, diabetes mellitus type 2, essential hypertension, chronic UTI, superficial anterior abdominal wound, depression, morbid obesity with BMI greater than 35 came to the hospital for evaluation of generalized weakness and low heart rate.  Patient states for the past 10 days or so she has noticed bilateral lower extremity swelling and elevated blood glucose.  Due to this she went to go see her PCP who adjusted her Levemir dose.  Since then she has had some diarrhea and feeling of nausea.  During this time she is also noticed worsening of her shortness of breath and some orthopnea.  And for the past 3-4 days she has felt progressively more and more weaker.  Today she decided to check her heart rate and it was noted to be in 30s.  She called EMS who confirmed this and she was brought to the hospital.  Patient also tells me that she was placed on Lexapro about 2 months ago which was switched from Wellbutrin.  In the ER she appeared generally weak.  Her labs suggested elevated BNP, lactic acidosis of 6, hyperglycemia with elevated anion gap of 20, troponin 0 0.29.  EKG showed heart rate in 30s with prolonged QTC of 606 and T wave inversion in anterior leads.  There was also evidence of anterior abdominal wound in the right lower quadrant without evidence of superficial infection but CT of the abdomen pelvis was done to confirm no deeper infection.  There was no acute abdominal pathology noted but she does have quite a bit of chronic changes.  Case was discussed by the ER provider with on-call cardiology, Dr. Percival Spanish at [facility] who recommended transferring the patient for possible ischemic  work-up and echocardiogram.  Requested medical admission due to multiple other medical issues.  When I saw the patient she was comfortable and did not have any other new complaints.   Review of Systems: As per HPI otherwise 10 point review of systems negative.  Review of Systems Otherwise negative except as per HPI, including: General: Denies fever, chills, night sweats or unintended weight loss. Resp: Denies cough, wheezing, shortness of breath. Cardiac: Denies chest pain, palpitations, orthopnea, paroxysmal nocturnal dyspnea. GI: Denies abdominal pain, nausea, vomiting, diarrhea or constipation GU: Denies dysuria, frequency, hesitancy or incontinence MS: Denies muscle aches, joint pain or swelling Neuro: Denies headache, neurologic deficits (focal weakness, numbness, tingling), abnormal gait Psych: Denies anxiety, depression, SI/HI/AVH Skin: Denies new rashes or lesions ID: Denies sick contacts, exotic exposures, travel  Past Medical History:  Diagnosis Date  . Atrial fibrillation ([place])   . CHF (congestive heart failure) ([place])   . Depression   . Diabetes mellitus without complication ([place])   . 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 Hypertension   . Morbid obesity ([place])   . Osteoporosis   . Urinary incontinence   . UTI (lower urinary tract infection) 01/2016   Cipro for Klebsiella pneumoniae isolate  . Wheelchair bound    bedbound    Past Surgical History:  Procedure Laterality Date  . ABDOMINAL HYSTERECTOMY    . BIOPSY  09/06/2018   Procedure: BIOPSY;  Surgeon: Danie Binder, MD;  [facility];  Service: Endoscopy;;  gastric bx's  . CESAREAN SECTION    . CHOLECYSTECTOMY    . ESOPHAGEAL DILATION  09/06/2018   Procedure: ESOPHAGEAL DILATION;  Surgeon: Danie Binder, MD;  [facility];  Service: Endoscopy;;  . ESOPHAGOGASTRODUODENOSCOPY (EGD) WITH PROPOFOL N/A 09/06/2018   Procedure: ESOPHAGOGASTRODUODENOSCOPY  (EGD) WITH PROPOFOL;  Surgeon: Danie Binder, MD;  [facility];  Service: Endoscopy;  Laterality: N/A;  dilatation  . FEMUR IM NAIL Left 02/20/2016  . FEMUR IM NAIL Left 02/20/2016   Procedure: INTRAMEDULLARY (IM) RETROGRADE FEMORAL NAILING;  Surgeon: Leandrew Koyanagi, MD;  Location: [place];  Service: Orthopedics;  Laterality: Left;  . PANCREAS SURGERY  1967   1 cyst excised and one cyst drained  . TEE WITHOUT CARDIOVERSION N/A 04/05/2018   Procedure: TRANSESOPHAGEAL ECHOCARDIOGRAM (TEE);  Surgeon: Dorothy Spark, MD;  Location: [place];  Service: Cardiovascular;  Laterality: N/A;  . [place]    . WRIST FRACTURE SURGERY      SOCIAL HISTORY:  reports that she has never smoked. She has never used smokeless tobacco. She reports that she does not drink alcohol or use drugs.  Allergies  Allergen Reactions  . Propranolol Swelling    Pt states it may have been leg swelling  . Topamax [Topiramate] Other (See Comments)    hallucinations  . Latex Itching and Rash    FAMILY HISTORY: Family History  Problem Relation Age of Onset  . Diabetes Mother        died in her 37's of a stroke  . Cancer Mother   . Stroke Mother   . Breast cancer Mother   . Diabetes Father        died in his 28's of a stroke.  . Stroke Father   . Diabetes Brother        died @ 48 of a stroke.  . Stroke Brother   . Diabetes Maternal Grandmother   . Diabetes Maternal Grandfather   . Diabetes Paternal Grandmother   . Diabetes Paternal Grandfather   . Breast cancer Maternal Aunt      Prior to Admission medications   Medication Sig Start Date End Date Taking? Authorizing Provider  acetaminophen (TYLENOL) 500 MG tablet Take 500-1,000 mg by mouth every 8 (eight) hours as needed for mild pain or headache.    Yes [provider]  albuterol (PROVENTIL HFA;VENTOLIN HFA) 108 (90 Base) MCG/ACT inhaler Inhale 2 puffs into the lungs every 6 (six) hours as needed for wheezing or  shortness of breath. Patient taking differently: Inhale 2 puffs into the lungs every 6 (six) hours as needed for wheezing or shortness of breath (As needed).  08/14/18  Yes Kathie Dike, MD  apixaban (ELIQUIS) 5 MG TABS tablet Take 1 tablet (5 mg total) by mouth 2 (two) times daily. 07/21/18  Yes Herminio Commons, MD  diltiazem (CARDIZEM CD) 120 MG 24 hr capsule Take 1 capsule (120 mg total) by mouth daily. Patient taking differently: Take 120 mg by mouth daily. Cartia XT 09/10/18  Yes Johnson, Clanford L, MD  escitalopram (LEXAPRO) 10 MG tablet Take 10 mg by mouth daily. 12/28/18  Yes [provider]  fluticasone (FLONASE) 50 MCG/ACT nasal spray Place 2 sprays into both nostrils daily as needed for allergies or rhinitis (congestion).    Yes [provider]  furosemide (LASIX) 20 MG tablet Take 20 mg by mouth daily. 10/30/18  Yes [provider]  glipiZIDE (GLUCOTROL) 10 MG tablet Take 10  mg by mouth 2 (two) times daily. 03/02/18  Yes [provider]  LEVEMIR FLEXTOUCH 100 UNIT/ML Pen Inject 10 Units into the skin 2 (two) times a day. 01/05/19  Yes [provider]  metoprolol tartrate (LOPRESSOR) 25 MG tablet Take 1 tablet (25 mg total) by mouth 2 (two) times daily. Patient taking differently: Take 50 mg by mouth every morning.  09/10/18  Yes Johnson, Clanford L, MD  nystatin (MYCOSTATIN/NYSTOP) powder Apply topically daily as needed (rash in skin folds).   Yes [provider]  OVER THE COUNTER MEDICATION Apply 1 application topically daily as needed (rash in skin folds). Baby butt paste   Yes [provider]  pantoprazole (PROTONIX) 40 MG tablet Take 1 tablet (40 mg total) by mouth daily before breakfast. Patient taking differently: Take 40 mg by mouth daily as needed (for GERD/acid reflux).  09/11/18  Yes Johnson, Clanford L, MD  pioglitazone (ACTOS) 45 MG tablet Take 45 mg by mouth daily. 10/30/18  Yes [provider]     Physical Exam: Vitals:   01/10/19 1900 01/10/19 1915 01/10/19 1930 01/10/19 2000  BP: (!) 142/60  (!) 142/61 (!) 136/120  Pulse:      Resp: (!) 21 (!) 22 19 20   Temp:      TempSrc:      SpO2:      Weight:      Height:          Constitutional: Generally weak appearing Eyes: PERRL, lids and conjunctivae normal ENMT: Mucous membranes are moist. Posterior pharynx clear of any exudate or lesions.Normal dentition.  Neck: normal, supple, no masses, no thyromegaly Respiratory: Diminished breath sounds at bilateral bases Cardiovascular: Regular rate and rhythm, no murmurs / rubs / gallops.  2+ bilateral lower extremity pitting edema. 2+ pedal pulses. No carotid bruits.  Abdomen: no tenderness, no masses palpated. No hepatosplenomegaly. Bowel sounds positive.  Musculoskeletal: no clubbing / cyanosis. No joint deformity upper and lower extremities. Good ROM, no contractures. Normal muscle tone.  Skin: Right lower abdomen wound area noted with dressing in place.  No evidence of deep infection. Neurologic: CN 2-12 grossly intact. Sensation intact, DTR normal. Strength 4/5 in all 4.  Psychiatric: Normal judgment and insight. Alert and oriented x 3. Normal mood.     Labs on Admission: I have personally reviewed following labs and imaging studies  CBC: Recent Labs  Lab 01/10/19 1646  WBC 10.4  NEUTROABS 8.6*  HGB 7.8*  HCT 27.4*  MCV 89.0  PLT 643   Basic Metabolic Panel: Recent Labs  Lab 01/10/19 1646  NA 134*  K 4.8  CL 98  CO2 16*  GLUCOSE 331*  BUN 28*  CREATININE 1.56*  CALCIUM 8.8*  MG 1.6*   GFR: Estimated Creatinine Clearance: 41.7 mL/min (A) (by C-G formula based on SCr of 1.56 mg/dL (H)). Liver Function Tests: Recent Labs  Lab 01/10/19 1646  AST 31  ALT 18  ALKPHOS 128*  BILITOT 1.6*  PROT 6.7  ALBUMIN 3.1*   Recent Labs  Lab 01/10/19 1646  LIPASE 17   No results for input(s): AMMONIA in the last 168 hours. Coagulation Profile: No results for  input(s): INR, PROTIME in the last 168 hours. Cardiac Enzymes: Recent Labs  Lab 01/10/19 1646  TROPONINI 0.29*   BNP (last 3 results) No results for input(s): PROBNP in the last 8760 hours. HbA1C: No results for input(s): HGBA1C in the last 72 hours. CBG: No results for input(s): GLUCAP in the last 168  hours. Lipid Profile: No results for input(s): CHOL, HDL, LDLCALC, TRIG, CHOLHDL, LDLDIRECT in the last 72 hours. Thyroid Function Tests: No results for input(s): TSH, T4TOTAL, FREET4, T3FREE, THYROIDAB in the last 72 hours. Anemia Panel: No results for input(s): VITAMINB12, FOLATE, FERRITIN, TIBC, IRON, RETICCTPCT in the last 72 hours. Urine analysis:    Component Value Date/Time   COLORURINE AMBER (A) 01/10/2019 1546   APPEARANCEUR CLOUDY (A) 01/10/2019 1546   LABSPEC 1.016 01/10/2019 1546   PHURINE 5.0 01/10/2019 1546   GLUCOSEU NEGATIVE 01/10/2019 1546   HGBUR MODERATE (A) 01/10/2019 1546   BILIRUBINUR NEGATIVE 01/10/2019 1546   KETONESUR NEGATIVE 01/10/2019 1546   PROTEINUR 100 (A) 01/10/2019 1546   UROBILINOGEN 0.2 12/01/2007 1025   NITRITE NEGATIVE 01/10/2019 1546   LEUKOCYTESUR LARGE (A) 01/10/2019 1546   Sepsis Labs: !!!!!!!!!!!!!!!!!!!!!!!!!!!!!!!!!!!!!!!!!!!! @LABRCNTIP (procalcitonin:4,lacticidven:4) ) Recent Results (from the past 240 hour(s))  SARS Coronavirus 2 (CEPHEID - Performed in [place] hospital lab), Hosp Order     Status: None   Collection Time: 01/10/19  3:49 PM  Result Value Ref Range Status   SARS Coronavirus 2 NEGATIVE NEGATIVE Final    Comment: (NOTE) If result is NEGATIVE SARS-CoV-2 target nucleic acids are NOT DETECTED. The SARS-CoV-2 RNA is generally detectable in upper and lower  respiratory specimens during the acute phase of infection. The lowest  concentration of SARS-CoV-2 viral copies this assay can detect is 250  copies / mL. A negative result does not preclude SARS-CoV-2 infection  and should not be used as the sole basis for  treatment or other  patient management decisions.  A negative result may occur with  improper specimen collection / handling, submission of specimen other  than nasopharyngeal swab, presence of viral mutation(s) within the  areas targeted by this assay, and inadequate number of viral copies  (<250 copies / mL). A negative result must be combined with clinical  observations, patient history, and epidemiological information. If result is POSITIVE SARS-CoV-2 target nucleic acids are DETECTED. The SARS-CoV-2 RNA is generally detectable in upper and lower  respiratory specimens dur ing the acute phase of infection.  Positive  results are indicative of active infection with SARS-CoV-2.  Clinical  correlation with patient history and other diagnostic information is  necessary to determine patient infection status.  Positive results do  not rule out bacterial infection or co-infection with other viruses. If result is PRESUMPTIVE POSTIVE SARS-CoV-2 nucleic acids MAY BE PRESENT.   A presumptive positive result was obtained on the submitted specimen  and confirmed on repeat testing.  While 2019 novel coronavirus  (SARS-CoV-2) nucleic acids may be present in the submitted sample  additional confirmatory testing may be necessary for epidemiological  and / or clinical management purposes  to differentiate between  SARS-CoV-2 and other Sarbecovirus currently known to infect humans.  If clinically indicated additional testing with an alternate test  methodology [phone]) is advised. The SARS-CoV-2 RNA is generally  detectable in upper and lower respiratory sp ecimens during the acute  phase of infection. The expected result is Negative. Fact Sheet for Patients:  StrictlyIdeas.no Fact Sheet for Healthcare Providers: BankingDealers.co.za This test is not yet approved or cleared by the [place] FDA and has been authorized for detection and/or  diagnosis of SARS-CoV-2 by FDA under an Emergency Use Authorization (EUA).  This EUA will remain in effect (meaning this test can be used) for the duration of the COVID-19 declaration under Section 564(b)(1) of the Act, 21 U.S.C. section 360bbb-3(b)(1), unless the authorization  is terminated or revoked sooner. Performed at [facility], [address] [place], [place] [address]      Radiological Exams on Admission: Ct Abdomen Pelvis Wo Contrast  Result Date: 01/10/2019 CLINICAL DATA:  3 day history of weakness with superficial wounds in the left lower quadrant abdominal wall. EXAM: CT ABDOMEN AND PELVIS WITHOUT CONTRAST TECHNIQUE: Multidetector CT imaging of the abdomen and pelvis was performed following the standard protocol without IV contrast. COMPARISON:  12/05/2018 FINDINGS: Lower chest: Tiny left and small right pleural effusions with dependent atelectasis in the lower lobes bilaterally. Hepatobiliary: No focal abnormality in the liver on this study without intravenous contrast. The liver shows diffusely decreased attenuation suggesting steatosis. Gallbladder is surgically absent. Extrahepatic common duct is dilated up to 17 mm diameter which is stable to 17 mm on the prior study. Pancreas: Diffusely atrophic without main duct dilatation. Spleen: No splenomegaly. No focal mass lesion. Adrenals/Urinary Tract: No adrenal nodule or mass. Kidneys are atrophic bilaterally without hydronephrosis. No evidence for hydroureter. Prominent gas volume noted in the urinary bladder. Stomach/Bowel: Stomach is unremarkable. No gastric wall thickening. No evidence of outlet obstruction. Duodenum is normally positioned as is the ligament of Treitz. No small bowel wall thickening. No small bowel dilatation. The terminal ileum is normal. The appendix is not visualized, but there is no edema or inflammation in the region of the cecum. No gross colonic mass. No colonic wall thickening. Diverticular changes are noted in  the left colon without evidence of diverticulitis. Vascular/Lymphatic: There is abdominal aortic atherosclerosis without aneurysm. There is no gastrohepatic or hepatoduodenal ligament lymphadenopathy. No intraperitoneal or retroperitoneal lymphadenopathy. No pelvic sidewall lymphadenopathy. Reproductive: The uterus is surgically absent. No left adnexal mass. 4.3 cm benign appearing cystic lesion in the right adnexal space is stable in the interval. Other: No intraperitoneal free fluid. There is some edema in the porta hepatis and along the inferior right liver. Musculoskeletal: Diffuse body wall edema noted. Skin thickening noted lower anterior abdominal wall with superficial gas in the subcutaneous fat identified in the right lower quadrant of the anterior abdominal wall. Bones are diffusely demineralized. No worrisome lytic or sclerotic osseous abnormality. IMPRESSION: 1. Minimal edema identified in the hepato duodenal ligament/porta hepatis. Etiology not evident on this study, but duodenitis and pancreatitis not excluded. 2. Large gas volume in the urinary bladder. In the absence of recent bladder instrumentation, infection should be considered. 3. Stable dilation of the extrahepatic common duct in this patient status post cholecystectomy. 4. Skin thickening lower anterior abdominal wall, right greater than left with subcutaneous superficial gas bubbles in the subcutaneous fat of the anterior right lower abdominal wall compatible with the reported wound. 5. Stable appearance of benign appearing right ovarian cyst measuring 4.3 cm is stable back to 09/03/2018. While interval stability is reassuring, patient age and cysts size indicate follow-up pelvic ultrasound to further evaluate. This recommendation follows ACR consensus guidelines: White Paper of the ACR Incidental Findings Committee II on Adnexal Findings. J Am Coll Radiol 2013:10:675-681. 6. Small right and tiny left pleural effusion with some dependent  atelectasis in the lower lobes bilaterally. 7.  Aortic Atherosclerois (ICD10-170.0) Electronically Signed   By: Misty Stanley M.D.   On: 01/10/2019 19:33   Dg Chest Port 1 View  Result Date: 01/10/2019 CLINICAL DATA:  Weakness EXAM: PORTABLE CHEST 1 VIEW COMPARISON:  08/16/2018 FINDINGS: Low volume AP portable examination. Cardiomegaly with mild pulmonary vascular prominence but without overt edema. No focal airspace opacity. IMPRESSION: Low volume AP portable  examination. Cardiomegaly with mild pulmonary vascular prominence but without overt edema. No focal airspace opacity. Electronically Signed   By: Eddie Candle M.D.   On: 01/10/2019 17:22     All images have been reviewed by me personally.  EKG: Independently reviewed.  T wave inversion in anterior leads.  Prolonged QTC 606.  Bradycardia with heart rate in 30s.  Assessment/Plan Principal Problem:   Symptomatic bradycardia Active Problems:   Diabetes type 2, uncontrolled (HCC)   HTN (hypertension)   Depression   CHF exacerbation (HCC)   Chronic diastolic CHF (congestive heart failure) (HCC)   CKD (chronic kidney disease), stage III (HCC)   COPD (chronic obstructive pulmonary disease) (HCC)   Wound, open, anterior abdominal wall   DKA (diabetic ketoacidoses) (HCC)    Symptomatic bradycardia with T wave inversions in anterior lead Prolonged QTC, 606 Generalized weakness -Admit the patient to [facility].  ER provider spoke with cardiology who recommends admission there and will see the patient in consultation.  For now they recommend ischemic work-up and obtaining echocardiogram. - First set of cardiac enzymes 0.29, continue to trend this.  Patient is already on Eliquis - Replete electrolytes aggressively, magnesium is 1.6. -Repeat EKG tomorrow morning.  Echocardiogram ordered. -Avoid any drugs that would prolong QTC. -Hold off on any AV nodal blockers  Acute congestive heart failure with preserved ejection fraction,  60%, class II - Lasix 40 mg IV twice daily.  Echocardiogram in January 2020 showed ejection fraction 60% -Closely monitor electrolytes.  Diabetic ketoacidosis History of diabetes mellitus type 2, insulin-dependent. - Hemoglobin A1c about a month ago was 7.7 -Diabetic ketoacidosis protocol.  Hold off on giving IV fluids due to fluid overload with congestive heart failure.  Closely monitor blood glucose levels. -BMP every 6 hours.  When able we will transition to subcutaneous insulin. -Check blood gas, monitor bicarb level  Nausea -Resolved.  CT of the abdomen pelvis is negative for any acute intra-abdominal pathology but does have lots of chronic changes.  Open anterior abdominal wound, chronic Acute on Chronic urinary tract infection -Both of this appears to be chronic in nature.  Recently seen by Dr. Megan Salon.   continue topical management for the wound/local wound care.  Will consult wound care team. - We will order urine cultures and IV meropenem. Previous Cx data reviewed. Multi Drug resistant Klebsiella in the past.   Anemia of chronic disease -No obvious evidence of blood loss.  Hemoglobin baseline 7.8.  She is on Eliquis.  History of atrial fibrillation, chronic - On AV nodal blockers and Eliquis.  Holding AV nodal blockers at this time  GERD   -Hold off on Protonix due to prolonged QTC.  History of depression -Hold Lexapro.  DVT prophylaxis: On Eliquis Code Status: DNR Family Communication: None at bedside Disposition Plan: To be determined Consults called: ER provider spoke with Dr. Percival Spanish from cardiology.  Consult them again once patient arrives  Admission status: Progressive care unit admission at [facility]   Time Spent: 65 minutes.  >50% of the time was devoted to discussing the patients care, assessment, plan and disposition with other care givers along with counseling the patient about the risks and benefits of treatment.    Ankit Arsenio Loader MD  [facility]  If 7PM-7AM, please contact night-coverage [email or web address]  01/10/2019, 8:49 PM

## 2019-01-11 ENCOUNTER — Other Ambulatory Visit: Payer: Self-pay

## 2019-01-11 ENCOUNTER — Inpatient Hospital Stay (HOSPITAL_COMMUNITY): Payer: BC Managed Care – PPO

## 2019-01-11 DIAGNOSIS — I5033 Acute on chronic diastolic (congestive) heart failure: Secondary | ICD-10-CM

## 2019-01-11 DIAGNOSIS — S31109S Unspecified open wound of abdominal wall, unspecified quadrant without penetration into peritoneal cavity, sequela: Secondary | ICD-10-CM

## 2019-01-11 DIAGNOSIS — R778 Other specified abnormalities of plasma proteins: Secondary | ICD-10-CM

## 2019-01-11 DIAGNOSIS — R112 Nausea with vomiting, unspecified: Secondary | ICD-10-CM

## 2019-01-11 DIAGNOSIS — I361 Nonrheumatic tricuspid (valve) insufficiency: Secondary | ICD-10-CM

## 2019-01-11 DIAGNOSIS — E1165 Type 2 diabetes mellitus with hyperglycemia: Secondary | ICD-10-CM

## 2019-01-11 DIAGNOSIS — R001 Bradycardia, unspecified: Secondary | ICD-10-CM

## 2019-01-11 DIAGNOSIS — R7989 Other specified abnormal findings of blood chemistry: Secondary | ICD-10-CM

## 2019-01-11 DIAGNOSIS — I10 Essential (primary) hypertension: Secondary | ICD-10-CM

## 2019-01-11 LAB — BASIC METABOLIC PANEL
Anion gap: 11 (ref 5–15)
Anion gap: 8 (ref 5–15)
BUN: 28 mg/dL — ABNORMAL HIGH (ref 8–23)
BUN: 30 mg/dL — ABNORMAL HIGH (ref 8–23)
CO2: 23 mmol/L (ref 22–32)
CO2: 25 mmol/L (ref 22–32)
Calcium: 8.5 mg/dL — ABNORMAL LOW (ref 8.9–10.3)
Calcium: 8 mg/dL — ABNORMAL LOW (ref 8.9–10.3)
Chloride: 101 mmol/L (ref 98–111)
Chloride: 98 mmol/L (ref 98–111)
Creatinine, Ser: 1.68 mg/dL — ABNORMAL HIGH (ref 0.44–1.00)
Creatinine, Ser: 2 mg/dL — ABNORMAL HIGH (ref 0.44–1.00)
GFR calc Af Amer: 36 mL/min — ABNORMAL LOW (ref >60)
GFR calc Af Amer: 29 mL/min — ABNORMAL LOW (ref >60)
GFR calc non Af Amer: 31 mL/min — ABNORMAL LOW (ref >60)
GFR calc non Af Amer: 25 mL/min — ABNORMAL LOW (ref >60)
Glucose, Bld: 165 mg/dL — ABNORMAL HIGH (ref 70–99)
Glucose, Bld: 115 mg/dL — ABNORMAL HIGH (ref 70–99)
Potassium: 4.2 mmol/L (ref 3.5–5.1)
Potassium: 4.2 mmol/L (ref 3.5–5.1)
Sodium: 135 mmol/L (ref 135–145)
Sodium: 131 mmol/L — ABNORMAL LOW (ref 135–145)

## 2019-01-11 LAB — ECHOCARDIOGRAM COMPLETE
Height: 65 in
Weight: 3788.38 oz

## 2019-01-11 LAB — GLUCOSE, CAPILLARY
Glucose-Capillary: 100 mg/dL — ABNORMAL HIGH (ref 70–99)
Glucose-Capillary: 132 mg/dL — ABNORMAL HIGH (ref 70–99)
Glucose-Capillary: 134 mg/dL — ABNORMAL HIGH (ref 70–99)
Glucose-Capillary: 144 mg/dL — ABNORMAL HIGH (ref 70–99)
Glucose-Capillary: 266 mg/dL — ABNORMAL HIGH (ref 70–99)

## 2019-01-11 LAB — HEPARIN LEVEL (UNFRACTIONATED): Heparin Unfractionated: >2.2 IU/mL — ABNORMAL HIGH (ref 0.30–0.70)

## 2019-01-11 LAB — APTT: aPTT: 44 seconds — ABNORMAL HIGH (ref 24–36)

## 2019-01-11 LAB — LACTIC ACID, PLASMA: Lactic Acid, Venous: 1.7 mmol/L (ref 0.5–1.9)

## 2019-01-11 LAB — CBC
HCT: 25.4 % — ABNORMAL LOW (ref 36.0–46.0)
Hemoglobin: 7.4 g/dL — ABNORMAL LOW (ref 12.0–15.0)
MCH: 24.3 pg — ABNORMAL LOW (ref 26.0–34.0)
MCHC: 29.1 g/dL — ABNORMAL LOW (ref 30.0–36.0)
MCV: 83.3 fL (ref 80.0–100.0)
Platelets: 311 10*3/uL (ref 150–400)
RBC: 3.05 MIL/uL — ABNORMAL LOW (ref 3.87–5.11)
RDW: 15.5 % (ref 11.5–15.5)
WBC: 12.8 10*3/uL — ABNORMAL HIGH (ref 4.0–10.5)
nRBC: 1.1 % — ABNORMAL HIGH (ref 0.0–0.2)

## 2019-01-11 LAB — MAGNESIUM: Magnesium: 1.7 mg/dL (ref 1.7–2.4)

## 2019-01-11 MED ORDER — INSULIN ASPART 100 UNIT/ML ~~LOC~~ SOLN
0.0000 [IU] | Freq: Every day | SUBCUTANEOUS | Status: DC
  Administered 2019-01-12: 2 [IU] via SUBCUTANEOUS

## 2019-01-11 MED ORDER — SODIUM CHLORIDE 0.9 % IV SOLN
2.0000 g | Freq: Two times a day (BID) | INTRAVENOUS | Status: DC
  Administered 2019-01-11 – 2019-01-12 (×2): 2 g via INTRAVENOUS
  Filled 2019-01-11 (×5): qty 2

## 2019-01-11 MED ORDER — PERFLUTREN LIPID MICROSPHERE
1.0000 mL | INTRAVENOUS | Status: AC | PRN
  Administered 2019-01-11: 10:00:00 4 mL via INTRAVENOUS
  Filled 2019-01-11: qty 10

## 2019-01-11 MED ORDER — INSULIN ASPART 100 UNIT/ML ~~LOC~~ SOLN
0.0000 [IU] | Freq: Three times a day (TID) | SUBCUTANEOUS | Status: DC
  Administered 2019-01-11 (×3): 2 [IU] via SUBCUTANEOUS
  Administered 2019-01-12: 3 [IU] via SUBCUTANEOUS
  Administered 2019-01-13: 12:00:00 5 [IU] via SUBCUTANEOUS
  Administered 2019-01-13 – 2019-01-14 (×2): 2 [IU] via SUBCUTANEOUS
  Administered 2019-01-14: 3 [IU] via SUBCUTANEOUS
  Administered 2019-01-14: 2 [IU] via SUBCUTANEOUS
  Administered 2019-01-15 (×2): 3 [IU] via SUBCUTANEOUS
  Administered 2019-01-16: 2 [IU] via SUBCUTANEOUS

## 2019-01-11 MED ORDER — HEPARIN (PORCINE) 25000 UT/250ML-% IV SOLN: 1150.0000 [IU]/h | INTRAVENOUS | Status: DC

## 2019-01-11 MED ORDER — APIXABAN 5 MG PO TABS
5.0000 mg | ORAL_TABLET | Freq: Two times a day (BID) | ORAL | Status: DC
  Administered 2019-01-11 – 2019-01-16 (×11): 5 mg via ORAL
  Filled 2019-01-11 (×11): qty 1

## 2019-01-11 MED ORDER — ONDANSETRON HCL 4 MG/2ML IJ SOLN
4.0000 mg | Freq: Four times a day (QID) | INTRAMUSCULAR | Status: DC | PRN
  Administered 2019-01-11 – 2019-01-12 (×2): 4 mg via INTRAVENOUS
  Filled 2019-01-11 (×2): qty 2

## 2019-01-11 MED ORDER — INSULIN DETEMIR 100 UNIT/ML ~~LOC~~ SOLN
10.0000 [IU] | Freq: Two times a day (BID) | SUBCUTANEOUS | Status: DC
  Administered 2019-01-11 – 2019-01-16 (×12): 10 [IU] via SUBCUTANEOUS
  Filled 2019-01-11 (×13): qty 0.1

## 2019-01-11 NOTE — Progress Notes (Signed)
  Echocardiogram 2D Echocardiogram with definity has been performed.  Darlina Sicilian M 01/11/2019, 10:10 AM

## 2019-01-11 NOTE — Progress Notes (Addendum)
ANTICOAGULATION CONSULT NOTE - Initial Consult  Pharmacy Consult for heparin Indication: atrial fibrillation  Allergies  Allergen Reactions  . Propranolol Swelling    Pt states it may have been leg swelling  . Topamax [Topiramate] Other (See Comments)    hallucinations  . Latex Itching and Rash    Patient Measurements: Height: 5' 5"  (165.1 cm) Weight: 236 lb 12.4 oz (107.4 kg) IBW/kg (Calculated) : 57 Heparin Dosing Weight: 82 kg  Vital Signs: Temp: 97.6 F (36.4 C) (05/25 2253) Temp Source: Oral (05/25 2253) BP: 150/53 (05/25 2253) Pulse Rate: 54 (05/25 2253)  Labs: Recent Labs    01/10/19 1646 01/10/19 2020  HGB 7.8*  --   HCT 27.4*  --   PLT 346  --   CREATININE 1.56* 1.55*  TROPONINI 0.29* 0.25*    Estimated Creatinine Clearance: 42.3 mL/min (A) (by C-G formula based on SCr of 1.55 mg/dL (H)).   Medical History: Past Medical History:  Diagnosis Date  . Atrial fibrillation ([place])   . CHF (congestive heart failure) ([place])   . Depression   . Diabetes mellitus without complication ([place])   . 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 Hypertension   . Morbid obesity ([place])   . Osteoporosis   . Urinary incontinence   . UTI (lower urinary tract infection) 01/2016   Cipro for Klebsiella pneumoniae isolate  . Wheelchair bound    bedbound    Medications:  Medications Prior to Admission  Medication Sig Dispense Refill Last Dose  . acetaminophen (TYLENOL) 500 MG tablet Take 500-1,000 mg by mouth every 8 (eight) hours as needed for mild pain or headache.    01/10/2019 at Unknown time  . albuterol (PROVENTIL HFA;VENTOLIN HFA) 108 (90 Base) MCG/ACT inhaler Inhale 2 puffs into the lungs every 6 (six) hours as needed for wheezing or shortness of breath. (Patient taking differently: Inhale 2 puffs into the lungs every 6 (six) hours as needed for wheezing or shortness of breath (As needed). ) 1 Inhaler 2 01/10/2019 at Unknown time  .  apixaban (ELIQUIS) 5 MG TABS tablet Take 1 tablet (5 mg total) by mouth 2 (two) times daily. 60 tablet 3 01/10/2019 at 1000  . diltiazem (CARDIZEM CD) 120 MG 24 hr capsule Take 1 capsule (120 mg total) by mouth daily. (Patient taking differently: Take 120 mg by mouth daily. Cartia XT)   01/10/2019 at Unknown time  . escitalopram (LEXAPRO) 10 MG tablet Take 10 mg by mouth daily.   01/10/2019 at Unknown time  . fluticasone (FLONASE) 50 MCG/ACT nasal spray Place 2 sprays into both nostrils daily as needed for allergies or rhinitis (congestion).    unknown  . furosemide (LASIX) 20 MG tablet Take 20 mg by mouth daily.   01/10/2019 at Unknown time  . glipiZIDE (GLUCOTROL) 10 MG tablet Take 10 mg by mouth 2 (two) times daily.  2 01/10/2019 at Unknown time  . LEVEMIR FLEXTOUCH 100 UNIT/ML Pen Inject 10 Units into the skin 2 (two) times a day.   01/07/2019 at Unknown time  . metoprolol tartrate (LOPRESSOR) 25 MG tablet Take 1 tablet (25 mg total) by mouth 2 (two) times daily. (Patient taking differently: Take 50 mg by mouth every morning. )   01/10/2019 at 1000  . nystatin (MYCOSTATIN/NYSTOP) powder Apply topically daily as needed (rash in skin folds).   > 30 days  . OVER THE COUNTER MEDICATION Apply 1 application topically daily as needed (rash in skin folds). Baby butt  paste   Past Month at Unknown time  . pantoprazole (PROTONIX) 40 MG tablet Take 1 tablet (40 mg total) by mouth daily before breakfast. (Patient taking differently: Take 40 mg by mouth daily as needed (for GERD/acid reflux). )   01/10/2019 at Unknown time  . pioglitazone (ACTOS) 45 MG tablet Take 45 mg by mouth daily.   01/09/2019 at Unknown time    Assessment: 68 y/o female admitted with generalized weakness and bradycardia. She takes apixaban PTA for hx Afib. Pharmacy consulted to begin IV heparin while apixaban is on hold for possible cath. Last apixaban dose was this morning at 01:32. No bleeding noted, Hgb is low at 7.8 (7-8s since 08/2018),  platelets are normal. AKI noted, SCr 1.55 (baseline 0.6-0.7s). Heparin level affected by recent apixaban use so will be monitoring aPTT until heparin level and aPTT correlate.  Goal of Therapy:  Heparin level 0.3-0.7 units/ml Monitor platelets by anticoagulation protocol: Yes   Plan:  Begin heparin drip at 1150 units/hr at 14:00 Baseline heparin level and aPTT  8 hr aPTT Daily heparin level, aPTT, CBC Monitor for s/sx of bleeding   Renold Genta, PharmD, BCPS Clinical Pharmacist Clinical phone for 01/11/2019 until 3p is [phone] 01/11/2019 8:19 AM  **Pharmacist phone directory can now be found on [email or web address] listed under [place]**  Addendum: Consulted to transition back to apixaban.  D/c heparin drip Apixaban 5 mg PO bid   Renold Genta, PharmD, BCPS Clinical Pharmacist 01/11/2019 1:07 PM

## 2019-01-11 NOTE — Consult Note (Addendum)
WOC consult requested for abd wound. Reviewed progress notes in the EMR. Pt is familiar to [facility] team from recent visit on 4/20; refer to previous consult.  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   Plan: Continue present plan of care; Aquacel to absorb drainage and provide antimicrobial benefits, foam dressing to protect from further injury. Please re-consult if further assistance is needed.  Thank-you,  Julien Girt MSN, [place], [place], [place], [place]

## 2019-01-11 NOTE — Progress Notes (Signed)
Progress Note  Patient Name: [name] Date of Encounter: 01/11/2019  Primary Cardiologist: Kate Sable, MD  Subjective   Weak no cardiac complaints getting bedside echo   Inpatient Medications    Scheduled Meds:  furosemide  40 mg Intravenous BID   insulin aspart  0-15 Units Subcutaneous TID WC   insulin aspart  0-5 Units Subcutaneous QHS   insulin detemir  10 Units Subcutaneous BID   Continuous Infusions:  heparin     meropenem (MERREM) IV 200 mL/hr at 01/11/19 0109   PRN Meds: acetaminophen **OR** acetaminophen, albuterol, bisacodyl, HYDROcodone-acetaminophen, ondansetron (ZOFRAN) IV, senna-docusate   Vital Signs    Vitals:   01/10/19 2100 01/10/19 2253 01/10/19 2308 01/11/19 0854  BP: (!) 144/70 (!) 150/53  (!) 135/98  Pulse:  (!) 54    Resp: (!) 25 17    Temp:  97.6 F (36.4 C)  97.8 F (36.6 C)  TempSrc:  Oral  Oral  SpO2: 96% 100%    Weight:   107.4 kg   Height:   5' 5"  (1.651 m)     Intake/Output Summary (Last 24 hours) at 01/11/2019 4970 Last data filed at 01/11/2019 0109 Gross per 24 hour  Intake 100 ml  Output --  Net 100 ml   Last 3 Weights 01/10/2019 01/10/2019 12/16/2018  Weight (lbs) 236 lb 12.4 oz 233 lb (No Data)  Weight (kg) 107.4 kg 105.688 kg (No Data)      Telemetry    NSR rate 60 's  - Personally Reviewed  ECG    Sinus bradycardia, rate 49 bpm, with first degree AV block and T wave inversions in anterolateral leads. - Personally Reviewed  Physical Exam  Obese white female  GEN: No acute distress.   Neck: No JVD Cardiac: RRR, no murmurs, rubs, or gallops.  Respiratory: Clear to auscultation bilaterally. GI: wound vac in place  MS: No edema; No deformity. Neuro:  Nonfocal  Psych: Normal affect  Plus one bilateral edema  Labs    Chemistry Recent Labs  Lab 01/10/19 1646 01/10/19 2020  NA 134* 133*  K 4.8 4.4  CL 98 98  CO2 16* 20*  GLUCOSE 331* 295*  BUN 28* 28*  CREATININE 1.56* 1.55*  CALCIUM  8.8* 8.4*  PROT 6.7  --   ALBUMIN 3.1*  --   AST 31  --   ALT 18  --   ALKPHOS 128*  --   BILITOT 1.6*  --   GFRNONAA 34* 34*  GFRAA 39* 39*  ANIONGAP 20* 15     Hematology Recent Labs  Lab 01/10/19 1646  WBC 10.4  RBC 3.08*  HGB 7.8*  HCT 27.4*  MCV 89.0  MCH 25.3*  MCHC 28.5*  RDW 15.6*  PLT 346    Cardiac Enzymes Recent Labs  Lab 01/10/19 1646 01/10/19 2020  TROPONINI 0.29* 0.25*   No results for input(s): TROPIPOC in the last 168 hours.   BNP Recent Labs  Lab 01/10/19 1646  BNP 2,107.0*     DDimer No results for input(s): DDIMER in the last 168 hours.   Radiology    Ct Abdomen Pelvis Wo Contrast  Result Date: 01/10/2019 CLINICAL DATA:  3 day history of weakness with superficial wounds in the left lower quadrant abdominal wall. EXAM: CT ABDOMEN AND PELVIS WITHOUT CONTRAST TECHNIQUE: Multidetector CT imaging of the abdomen and pelvis was performed following the standard protocol without IV contrast. COMPARISON:  12/05/2018 FINDINGS: Lower chest: Tiny left and small right  pleural effusions with dependent atelectasis in the lower lobes bilaterally. Hepatobiliary: No focal abnormality in the liver on this study without intravenous contrast. The liver shows diffusely decreased attenuation suggesting steatosis. Gallbladder is surgically absent. Extrahepatic common duct is dilated up to 17 mm diameter which is stable to 17 mm on the prior study. Pancreas: Diffusely atrophic without main duct dilatation. Spleen: No splenomegaly. No focal mass lesion. Adrenals/Urinary Tract: No adrenal nodule or mass. Kidneys are atrophic bilaterally without hydronephrosis. No evidence for hydroureter. Prominent gas volume noted in the urinary bladder. Stomach/Bowel: Stomach is unremarkable. No gastric wall thickening. No evidence of outlet obstruction. Duodenum is normally positioned as is the ligament of Treitz. No small bowel wall thickening. No small bowel dilatation. The terminal ileum  is normal. The appendix is not visualized, but there is no edema or inflammation in the region of the cecum. No gross colonic mass. No colonic wall thickening. Diverticular changes are noted in the left colon without evidence of diverticulitis. Vascular/Lymphatic: There is abdominal aortic atherosclerosis without aneurysm. There is no gastrohepatic or hepatoduodenal ligament lymphadenopathy. No intraperitoneal or retroperitoneal lymphadenopathy. No pelvic sidewall lymphadenopathy. Reproductive: The uterus is surgically absent. No left adnexal mass. 4.3 cm benign appearing cystic lesion in the right adnexal space is stable in the interval. Other: No intraperitoneal free fluid. There is some edema in the porta hepatis and along the inferior right liver. Musculoskeletal: Diffuse body wall edema noted. Skin thickening noted lower anterior abdominal wall with superficial gas in the subcutaneous fat identified in the right lower quadrant of the anterior abdominal wall. Bones are diffusely demineralized. No worrisome lytic or sclerotic osseous abnormality. IMPRESSION: 1. Minimal edema identified in the hepato duodenal ligament/porta hepatis. Etiology not evident on this study, but duodenitis and pancreatitis not excluded. 2. Large gas volume in the urinary bladder. In the absence of recent bladder instrumentation, infection should be considered. 3. Stable dilation of the extrahepatic common duct in this patient status post cholecystectomy. 4. Skin thickening lower anterior abdominal wall, right greater than left with subcutaneous superficial gas bubbles in the subcutaneous fat of the anterior right lower abdominal wall compatible with the reported wound. 5. Stable appearance of benign appearing right ovarian cyst measuring 4.3 cm is stable back to 09/03/2018. While interval stability is reassuring, patient age and cysts size indicate follow-up pelvic ultrasound to further evaluate. This recommendation follows ACR consensus  guidelines: White Paper of the ACR Incidental Findings Committee II on Adnexal Findings. J Am Coll Radiol 2013:10:675-681. 6. Small right and tiny left pleural effusion with some dependent atelectasis in the lower lobes bilaterally. 7.  Aortic Atherosclerois (ICD10-170.0) Electronically Signed   By: Misty Stanley M.D.   On: 01/10/2019 19:33   Dg Chest Port 1 View  Result Date: 01/10/2019 CLINICAL DATA:  Weakness EXAM: PORTABLE CHEST 1 VIEW COMPARISON:  08/16/2018 FINDINGS: Low volume AP portable examination. Cardiomegaly with mild pulmonary vascular prominence but without overt edema. No focal airspace opacity. IMPRESSION: Low volume AP portable examination. Cardiomegaly with mild pulmonary vascular prominence but without overt edema. No focal airspace opacity. Electronically Signed   By: Eddie Candle M.D.   On: 01/10/2019 17:22    Cardiac Studies   Echocardiogram 09/04/2018: Study Conclusions: - Procedure narrative: Transthoracic echocardiography. Image   quality was adequate. The study was technically difficult.   Intravenous contrast (Definity) was administered. - Left ventricle: Systolic function was normal. The estimated   ejection fraction was in the range of 60% to 65%. Wall motion was  normal; there were no regional wall motion abnormalities. - Mitral valve: Calcified annulus. Mildly thickened leaflets . - Left atrium: The left atrial appendage is incompletely   visualized.  Impressions: - Limited study for persistent left atrial appendage thrombus. The   left atrial appendage was poorly visualized, particularly the   apex. Recommend repeat TEE to better visualize the LAA to   determine if the thrombus has resolved.  Patient Profile   [name] is a 68 y.o. female with a history of chronic diastolic CHF, atrial fibrillation on Eliquis, hypertension,  type 2 diabetes mellitus, chronic UTI, chronic non-healing abdominal wound, and morbid obesity who is being seen for evaluation of  bradycardia and elevated troponin.   Assessment & Plan    Bradycardia  - resolved doubt related to symptoms no AV block repeat ECG this am as QT appears normal   Elevated Troponin - Troponin minimally elevated and flat at 0.29 >> 0.25.  - ECG with T inversions anteriorly not clear significance since older ECGls in rapid afib - doubt acute coronary syndrome no chest pain and no trend Echo pending no cath unless EF significantly different   Acute on Chronic Diastolic CHF - Chest x-ray showed cardiomegaly with mild pulmonary vascular prominence but no overt edema. - BNP elevated at 2,107. - LVEF of 60-65% on Echo in 08/2018. - Repeat Echo pending. - Patient started on IV Lasix 62m twice daily on admission. - Continue to monitor daily weight, strict I/O's, and renal function.   Otherwise, per primary team.  For questions or updates, please contact CLake WaukomisPlease consult [email or web address] for contact info under        [employer]

## 2019-01-11 NOTE — Progress Notes (Signed)
PROGRESS NOTE    [name]  TXH:[id number] DOB: [birth year] DOA: 01/10/2019 PCP: [name], MD    Brief Narrative:   [name] is a 68 y.o. female with medical history significant of chronic atrial fibrillation on Eliquis, diabetes mellitus type 2, essential hypertension, chronic UTI, superficial anterior abdominal wound, depression, morbid obesity with BMI greater than 35 came to the hospital for evaluation of generalized weakness and low heart rate. Patient states for the past 10 days or so she has noticed bilateral lower extremity swelling and elevated blood glucose .    There was also evidence of anterior abdominal wound in the right lower quadrant without evidence of superficial infection but CT of the abdomen pelvis was done to confirm no deeper infection.    Assessment & Plan:   Principal Problem:   Symptomatic bradycardia Active Problems:   Diabetes type 2, uncontrolled (HCC)   HTN (hypertension)   Depression   CHF exacerbation (HCC)   Chronic diastolic CHF (congestive heart failure) (HCC)   CKD (chronic kidney disease), stage III (HCC)   COPD (chronic obstructive pulmonary disease) (HCC)   Wound, open, anterior abdominal wall   DKA (diabetic ketoacidoses) (HCC)   Bradycardia: Resolved. Holding BB for now.   Echocardiogram shows The left ventricle has low normal systolic function, with an ejection fraction of 50-55%. The cavity size was normal. There is mild concentric left ventricular hypertrophy. Left ventricular diastolic Doppler parameters are consistent with  pseudonormalization. Elevated mean left atrial pressure There is right ventricular volume overload. No evidence of left ventricular regional wall motion abnormalities. There is severe mitral annular calcification present. Possible very mild mitral valve stenosis     Mild acute on chronic diastolic heart failure.  Diuresis with IV lasix 40 mg BID.  Repeat echocardiogram reviewed.  Daily  weights, strict intake and output.  Monitor renal parameters while on lasix.  Keep K > 4 and MG >2.   Elevated troponin:  Possibly from demand ischemia.  She denies any chest pain or sob.   Chronic UTI/ Chronic anterior abdominal wall wound:  UA is abnormal. Urine cultures pending.  Follows up with Dr Megan Salon as outpatient.  Wound care consulted. --> Aquacel to absorb drainage and provide antimicrobial benefits, foam dressing to protect from further injury. Started her on IV meropenem in view of the MDR in past. She will need urological consultation in view of her chronic UTI'S   Type 2 diabetes Mellitus, insulin dependent :  A1c is 7.7 CBG (last 3)  Recent Labs    01/11/19 0009 01/11/19 0819 01/11/19 1210  GLUCAP 266* 132* 144*   Resume SSI.   Chronic atrial fibrillation:  Resume eliquis .  Rate well controlled.    Depression:  Resume lexapro.    Anemia of chronic disease:  Transfuse to keep hemoglobin greater than 7.     DVT prophylaxis: Eliquis Code Status: full code.  Family Communication: none at bedside, discussed the plan with the patient.  Disposition Plan: pending clinical improvement.    Consultants:   Cardiology.   Procedures:  Echocardiogram.  CT abdomen and pelvis.   Antimicrobials: IV meropenam since admission.   Subjective: Persistent nausea and headache  Objective: Vitals:   01/10/19 2100 01/10/19 2253 01/10/19 2308 01/11/19 0854  BP: (!) 144/70 (!) 150/53  (!) 135/98  Pulse:  (!) 54    Resp: (!) 25 17    Temp:  97.6 F (36.4 C)  97.8 F (36.6 C)  TempSrc:  Oral  Oral  SpO2: 96% 100%    Weight:   107.4 kg   Height:   5' 5"  (1.651 m)     Intake/Output Summary (Last 24 hours) at 01/11/2019 1242 Last data filed at 01/11/2019 0109 Gross per 24 hour  Intake 100 ml  Output -  Net 100 ml   Filed Weights   01/10/19 1526 01/10/19 2308  Weight: 105.7 kg 107.4 kg    Examination:  General exam: MODERATE distress from  nausea and headache.  Respiratory system: diminished at bases, no wheezing heard.  Cardiovascular system: S1 & S2 heard, RRR. Pedal edema present.  Gastrointestinal system: Abdomen is soft , mildly tender, distended, bowel sounds good.  Central nervous system: Alert and oriented. No focal neurological deficits. Extremities: bilateral pedal edema.  Skin: No rashes, lesions or ulcers Psychiatry:  Mood & affect appropriate.     Data Reviewed: I have personally reviewed following labs and imaging studies  CBC: Recent Labs  Lab 01/10/19 1646 01/11/19 1106  WBC 10.4 12.8*  NEUTROABS 8.6*  --   HGB 7.8* 7.4*  HCT 27.4* 25.4*  MCV 89.0 83.3  PLT 346 323   Basic Metabolic Panel: Recent Labs  Lab 01/10/19 1646 01/10/19 2020 01/11/19 1106  NA 134* 133* 135  K 4.8 4.4 4.2  CL 98 98 101  CO2 16* 20* 23  GLUCOSE 331* 295* 165*  BUN 28* 28* 28*  CREATININE 1.56* 1.55* 1.68*  CALCIUM 8.8* 8.4* 8.5*  MG 1.6*  --  1.7   GFR: Estimated Creatinine Clearance: 39.1 mL/min (A) (by C-G formula based on SCr of 1.68 mg/dL (H)). Liver Function Tests: Recent Labs  Lab 01/10/19 1646  AST 31  ALT 18  ALKPHOS 128*  BILITOT 1.6*  PROT 6.7  ALBUMIN 3.1*   Recent Labs  Lab 01/10/19 1646  LIPASE 17   No results for input(s): AMMONIA in the last 168 hours. Coagulation Profile: No results for input(s): INR, PROTIME in the last 168 hours. Cardiac Enzymes: Recent Labs  Lab 01/10/19 1646 01/10/19 2020  TROPONINI 0.29* 0.25*   BNP (last 3 results) No results for input(s): PROBNP in the last 8760 hours. HbA1C: No results for input(s): HGBA1C in the last 72 hours. CBG: Recent Labs  Lab 01/10/19 2129 01/11/19 0009 01/11/19 0819 01/11/19 1210  GLUCAP 278* 266* 132* 144*   Lipid Profile: No results for input(s): CHOL, HDL, LDLCALC, TRIG, CHOLHDL, LDLDIRECT in the last 72 hours. Thyroid Function Tests: No results for input(s): TSH, T4TOTAL, FREET4, T3FREE, THYROIDAB in the last  72 hours. Anemia Panel: No results for input(s): VITAMINB12, FOLATE, FERRITIN, TIBC, IRON, RETICCTPCT in the last 72 hours. Sepsis Labs: Recent Labs  Lab 01/10/19 1646 01/10/19 1834  LATICACIDVEN 6.2* 2.9*    Recent Results (from the past 240 hour(s))  SARS Coronavirus 2 (CEPHEID - Performed in [facility] hospital lab), Hosp Order     Status: None   Collection Time: 01/10/19  3:49 PM  Result Value Ref Range Status   SARS Coronavirus 2 NEGATIVE NEGATIVE Final    Comment: (NOTE) If result is NEGATIVE SARS-CoV-2 target nucleic acids are NOT DETECTED. The SARS-CoV-2 RNA is generally detectable in upper and lower  respiratory specimens during the acute phase of infection. The lowest  concentration of SARS-CoV-2 viral copies this assay can detect is 250  copies / mL. A negative result does not preclude SARS-CoV-2 infection  and should not be used as the sole basis for treatment or other  patient management decisions.  A negative result may occur with  improper specimen collection / handling, submission of specimen other  than nasopharyngeal swab, presence of viral mutation(s) within the  areas targeted by this assay, and inadequate number of viral copies  (<250 copies / mL). A negative result must be combined with clinical  observations, patient history, and epidemiological information. If result is POSITIVE SARS-CoV-2 target nucleic acids are DETECTED. The SARS-CoV-2 RNA is generally detectable in upper and lower  respiratory specimens dur ing the acute phase of infection.  Positive  results are indicative of active infection with SARS-CoV-2.  Clinical  correlation with patient history and other diagnostic information is  necessary to determine patient infection status.  Positive results do  not rule out bacterial infection or co-infection with other viruses. If result is PRESUMPTIVE POSTIVE SARS-CoV-2 nucleic acids MAY BE PRESENT.   A presumptive positive result was obtained on  the submitted specimen  and confirmed on repeat testing.  While 2019 novel coronavirus  (SARS-CoV-2) nucleic acids may be present in the submitted sample  additional confirmatory testing may be necessary for epidemiological  and / or clinical management purposes  to differentiate between  SARS-CoV-2 and other Sarbecovirus currently known to infect humans.  If clinically indicated additional testing with an alternate test  methodology [phone]) is advised. The SARS-CoV-2 RNA is generally  detectable in upper and lower respiratory sp ecimens during the acute  phase of infection. The expected result is Negative. Fact Sheet for Patients:  StrictlyIdeas.no Fact Sheet for Healthcare Providers: BankingDealers.co.za This test is not yet approved or cleared by the [place] FDA and has been authorized for detection and/or diagnosis of SARS-CoV-2 by FDA under an Emergency Use Authorization (EUA).  This EUA will remain in effect (meaning this test can be used) for the duration of the COVID-19 declaration under Section 564(b)(1) of the Act, 21 U.S.C. section 360bbb-3(b)(1), unless the authorization is terminated or revoked sooner. Performed at [facility], [address] [place], [place] [address]          Radiology Studies: Ct Abdomen Pelvis Wo Contrast  Result Date: 01/10/2019 CLINICAL DATA:  3 day history of weakness with superficial wounds in the left lower quadrant abdominal wall. EXAM: CT ABDOMEN AND PELVIS WITHOUT CONTRAST TECHNIQUE: Multidetector CT imaging of the abdomen and pelvis was performed following the standard protocol without IV contrast. COMPARISON:  12/05/2018 FINDINGS: Lower chest: Tiny left and small right pleural effusions with dependent atelectasis in the lower lobes bilaterally. Hepatobiliary: No focal abnormality in the liver on this study without intravenous contrast. The liver shows diffusely decreased attenuation  suggesting steatosis. Gallbladder is surgically absent. Extrahepatic common duct is dilated up to 17 mm diameter which is stable to 17 mm on the prior study. Pancreas: Diffusely atrophic without main duct dilatation. Spleen: No splenomegaly. No focal mass lesion. Adrenals/Urinary Tract: No adrenal nodule or mass. Kidneys are atrophic bilaterally without hydronephrosis. No evidence for hydroureter. Prominent gas volume noted in the urinary bladder. Stomach/Bowel: Stomach is unremarkable. No gastric wall thickening. No evidence of outlet obstruction. Duodenum is normally positioned as is the ligament of Treitz. No small bowel wall thickening. No small bowel dilatation. The terminal ileum is normal. The appendix is not visualized, but there is no edema or inflammation in the region of the cecum. No gross colonic mass. No colonic wall thickening. Diverticular changes are noted in the left colon without evidence of diverticulitis. Vascular/Lymphatic: There is abdominal aortic atherosclerosis without aneurysm. There is no gastrohepatic or hepatoduodenal  ligament lymphadenopathy. No intraperitoneal or retroperitoneal lymphadenopathy. No pelvic sidewall lymphadenopathy. Reproductive: The uterus is surgically absent. No left adnexal mass. 4.3 cm benign appearing cystic lesion in the right adnexal space is stable in the interval. Other: No intraperitoneal free fluid. There is some edema in the porta hepatis and along the inferior right liver. Musculoskeletal: Diffuse body wall edema noted. Skin thickening noted lower anterior abdominal wall with superficial gas in the subcutaneous fat identified in the right lower quadrant of the anterior abdominal wall. Bones are diffusely demineralized. No worrisome lytic or sclerotic osseous abnormality. IMPRESSION: 1. Minimal edema identified in the hepato duodenal ligament/porta hepatis. Etiology not evident on this study, but duodenitis and pancreatitis not excluded. 2. Large gas volume  in the urinary bladder. In the absence of recent bladder instrumentation, infection should be considered. 3. Stable dilation of the extrahepatic common duct in this patient status post cholecystectomy. 4. Skin thickening lower anterior abdominal wall, right greater than left with subcutaneous superficial gas bubbles in the subcutaneous fat of the anterior right lower abdominal wall compatible with the reported wound. 5. Stable appearance of benign appearing right ovarian cyst measuring 4.3 cm is stable back to 09/03/2018. While interval stability is reassuring, patient age and cysts size indicate follow-up pelvic ultrasound to further evaluate. This recommendation follows ACR consensus guidelines: White Paper of the ACR Incidental Findings Committee II on Adnexal Findings. J Am Coll Radiol 2013:10:675-681. 6. Small right and tiny left pleural effusion with some dependent atelectasis in the lower lobes bilaterally. 7.  Aortic Atherosclerois (ICD10-170.0) Electronically Signed   By: Misty Stanley M.D.   On: 01/10/2019 19:33   Dg Chest Port 1 View  Result Date: 01/10/2019 CLINICAL DATA:  Weakness EXAM: PORTABLE CHEST 1 VIEW COMPARISON:  08/16/2018 FINDINGS: Low volume AP portable examination. Cardiomegaly with mild pulmonary vascular prominence but without overt edema. No focal airspace opacity. IMPRESSION: Low volume AP portable examination. Cardiomegaly with mild pulmonary vascular prominence but without overt edema. No focal airspace opacity. Electronically Signed   By: Eddie Candle M.D.   On: 01/10/2019 17:22        Scheduled Meds: . furosemide  40 mg Intravenous BID  . insulin aspart  0-15 Units Subcutaneous TID WC  . insulin aspart  0-5 Units Subcutaneous QHS  . insulin detemir  10 Units Subcutaneous BID   Continuous Infusions: . meropenem (MERREM) IV 200 mL/hr at 01/11/19 0109     LOS: 1 day    Time spent: [place], MD [facility] Pager [phone]  If  7PM-7AM, please contact night-coverage [email or web address] Password [facility] 01/11/2019, 12:42 PM

## 2019-01-11 NOTE — Plan of Care (Signed)

## 2019-01-12 ENCOUNTER — Telehealth: Payer: Self-pay | Admitting: Hematology

## 2019-01-12 LAB — CBC
HCT: 27.3 % — ABNORMAL LOW (ref 36.0–46.0)
Hemoglobin: 8 g/dL — ABNORMAL LOW (ref 12.0–15.0)
MCH: 24.8 pg — ABNORMAL LOW (ref 26.0–34.0)
MCHC: 29.3 g/dL — ABNORMAL LOW (ref 30.0–36.0)
MCV: 84.8 fL (ref 80.0–100.0)
Platelets: 362 10*3/uL (ref 150–400)
RBC: 3.22 MIL/uL — ABNORMAL LOW (ref 3.87–5.11)
RDW: 15.4 % (ref 11.5–15.5)
WBC: 10.2 10*3/uL (ref 4.0–10.5)
nRBC: 2.6 % — ABNORMAL HIGH (ref 0.0–0.2)

## 2019-01-12 LAB — BASIC METABOLIC PANEL
Anion gap: 9 (ref 5–15)
BUN: 31 mg/dL — ABNORMAL HIGH (ref 8–23)
CO2: 25 mmol/L (ref 22–32)
Calcium: 8.3 mg/dL — ABNORMAL LOW (ref 8.9–10.3)
Chloride: 102 mmol/L (ref 98–111)
Creatinine, Ser: 2.06 mg/dL — ABNORMAL HIGH (ref 0.44–1.00)
GFR calc Af Amer: 28 mL/min — ABNORMAL LOW (ref >60)
GFR calc non Af Amer: 24 mL/min — ABNORMAL LOW (ref >60)
Glucose, Bld: 92 mg/dL (ref 70–99)
Potassium: 4.3 mmol/L (ref 3.5–5.1)
Sodium: 136 mmol/L (ref 135–145)

## 2019-01-12 LAB — RETICULOCYTES
Immature Retic Fract: 31 % — ABNORMAL HIGH (ref 2.3–15.9)
RBC.: 3.22 MIL/uL — ABNORMAL LOW (ref 3.87–5.11)
Retic Count, Absolute: 76.6 10*3/uL (ref 19.0–186.0)
Retic Ct Pct: 2.4 % (ref 0.4–3.1)

## 2019-01-12 LAB — FERRITIN: Ferritin: 33 ng/mL (ref 11–307)

## 2019-01-12 LAB — IRON AND TIBC
Iron: 11 ug/dL — ABNORMAL LOW (ref 28–170)
Saturation Ratios: 3 % — ABNORMAL LOW (ref 10.4–31.8)
TIBC: 343 ug/dL (ref 250–450)
UIBC: 332 ug/dL

## 2019-01-12 LAB — GLUCOSE, CAPILLARY
Glucose-Capillary: 72 mg/dL (ref 70–99)
Glucose-Capillary: 103 mg/dL — ABNORMAL HIGH (ref 70–99)
Glucose-Capillary: 174 mg/dL — ABNORMAL HIGH (ref 70–99)
Glucose-Capillary: 209 mg/dL — ABNORMAL HIGH (ref 70–99)

## 2019-01-12 LAB — FOLATE: Folate: 10.6 ng/mL (ref >5.9)

## 2019-01-12 LAB — VITAMIN B12: Vitamin B-12: 343 pg/mL (ref 180–914)

## 2019-01-12 MED ORDER — CEPHALEXIN 250 MG PO CAPS
250.0000 mg | ORAL_CAPSULE | Freq: Three times a day (TID) | ORAL | Status: DC
  Filled 2019-01-12 (×2): qty 1

## 2019-01-12 MED ORDER — SODIUM CHLORIDE 0.9% FLUSH: 10.0000 mL | INTRAVENOUS | Status: DC | PRN

## 2019-01-12 MED ORDER — DOXYCYCLINE HYCLATE 100 MG PO TABS
100.0000 mg | ORAL_TABLET | Freq: Two times a day (BID) | ORAL | Status: DC
  Administered 2019-01-12 – 2019-01-16 (×9): 100 mg via ORAL
  Filled 2019-01-12 (×9): qty 1

## 2019-01-12 NOTE — Progress Notes (Signed)
Pt refusing to be stuck again by IV team Pt has no working IV Md made aware

## 2019-01-12 NOTE — Telephone Encounter (Signed)
Received a call from the pt's provider from the hospital to reschedule her hem appt w/Dr. Irene Limbo to 6/9 at 1pm.

## 2019-01-12 NOTE — Progress Notes (Addendum)
Progress Note  Patient Name: [name] Date of Encounter: 01/12/2019  Primary Cardiologist: Kate Sable, MD  Subjective   No significant overnight events. Patient states she is feeling better than yesterday. She still feels very weak but thinks her breathing has improved. She denies any chest pain or palpitations.  Inpatient Medications    Scheduled Meds:  apixaban  5 mg Oral BID   furosemide  40 mg Intravenous BID   insulin aspart  0-15 Units Subcutaneous TID WC   insulin aspart  0-5 Units Subcutaneous QHS   insulin detemir  10 Units Subcutaneous BID   Continuous Infusions:  meropenem (MERREM) IV 2 g (01/12/19 0420)   PRN Meds: acetaminophen **OR** acetaminophen, albuterol, bisacodyl, HYDROcodone-acetaminophen, ondansetron (ZOFRAN) IV, senna-docusate   Vital Signs    Vitals:   01/11/19 1100 01/11/19 1123 01/11/19 2227 01/11/19 2229  BP: 122/62 138/66 133/74   Pulse: (!) 59 (!) 58 (!) 59   Resp: 19 (!) 23 (!) 21   Temp:  97.6 F (36.4 C)  97.9 F (36.6 C)  TempSrc:  Oral  Oral  SpO2: 100% 100% 98%   Weight:      Height:        Intake/Output Summary (Last 24 hours) at 01/12/2019 0734 Last data filed at 01/11/2019 2228 Gross per 24 hour  Intake 118 ml  Output --  Net 118 ml   Last 3 Weights 01/10/2019 01/10/2019 12/16/2018  Weight (lbs) 236 lb 12.4 oz 233 lb (No Data)  Weight (kg) 107.4 kg 105.688 kg (No Data)      Telemetry    Sinus rhythm with rates in the high 50's to 60's. - Personally Reviewed  ECG    No new ECG tracing today. - Personally Reviewed  Physical Exam   GEN: Obese Caucasian female resting comfortably in no acute distress.   Neck: Supple. Cardiac: RRR. No murmurs, rubs, or gallops.  Respiratory: Decreased breath sounds but clear to auscultation bilaterally. GI: Soft and non-tender. Right lower abdominal wound with dressing in place. MS: Mild bilateral lower extremity edema. Neuro:  No focal deficits. Psych: Normal  affect. Responds appropriately.  Labs    Chemistry Recent Labs  Lab 01/10/19 1646  01/11/19 1106 01/11/19 2053 01/12/19 0324  NA 134*   < > 135 131* 136  K 4.8   < > 4.2 4.2 4.3  CL 98   < > 101 98 102  CO2 16*   < > 23 25 25   GLUCOSE 331*   < > 165* 115* 92  BUN 28*   < > 28* 30* 31*  CREATININE 1.56*   < > 1.68* 2.00* 2.06*  CALCIUM 8.8*   < > 8.5* 8.0* 8.3*  PROT 6.7  --   --   --   --   ALBUMIN 3.1*  --   --   --   --   AST 31  --   --   --   --   ALT 18  --   --   --   --   ALKPHOS 128*  --   --   --   --   BILITOT 1.6*  --   --   --   --   GFRNONAA 34*   < > 31* 25* 24*  GFRAA 39*   < > 36* 29* 28*  ANIONGAP 20*   < > 11 8 9    < > = values in this interval not displayed.  Hematology Recent Labs  Lab 01/10/19 1646 01/11/19 1106  WBC 10.4 12.8*  RBC 3.08* 3.05*  HGB 7.8* 7.4*  HCT 27.4* 25.4*  MCV 89.0 83.3  MCH 25.3* 24.3*  MCHC 28.5* 29.1*  RDW 15.6* 15.5  PLT 346 311    Cardiac Enzymes Recent Labs  Lab 01/10/19 1646 01/10/19 2020  TROPONINI 0.29* 0.25*   No results for input(s): TROPIPOC in the last 168 hours.   BNP Recent Labs  Lab 01/10/19 1646  BNP 2,107.0*     DDimer No results for input(s): DDIMER in the last 168 hours.   Radiology    Ct Abdomen Pelvis Wo Contrast  Result Date: 01/10/2019 CLINICAL DATA:  3 day history of weakness with superficial wounds in the left lower quadrant abdominal wall. EXAM: CT ABDOMEN AND PELVIS WITHOUT CONTRAST TECHNIQUE: Multidetector CT imaging of the abdomen and pelvis was performed following the standard protocol without IV contrast. COMPARISON:  12/05/2018 FINDINGS: Lower chest: Tiny left and small right pleural effusions with dependent atelectasis in the lower lobes bilaterally. Hepatobiliary: No focal abnormality in the liver on this study without intravenous contrast. The liver shows diffusely decreased attenuation suggesting steatosis. Gallbladder is surgically absent. Extrahepatic common duct is  dilated up to 17 mm diameter which is stable to 17 mm on the prior study. Pancreas: Diffusely atrophic without main duct dilatation. Spleen: No splenomegaly. No focal mass lesion. Adrenals/Urinary Tract: No adrenal nodule or mass. Kidneys are atrophic bilaterally without hydronephrosis. No evidence for hydroureter. Prominent gas volume noted in the urinary bladder. Stomach/Bowel: Stomach is unremarkable. No gastric wall thickening. No evidence of outlet obstruction. Duodenum is normally positioned as is the ligament of Treitz. No small bowel wall thickening. No small bowel dilatation. The terminal ileum is normal. The appendix is not visualized, but there is no edema or inflammation in the region of the cecum. No gross colonic mass. No colonic wall thickening. Diverticular changes are noted in the left colon without evidence of diverticulitis. Vascular/Lymphatic: There is abdominal aortic atherosclerosis without aneurysm. There is no gastrohepatic or hepatoduodenal ligament lymphadenopathy. No intraperitoneal or retroperitoneal lymphadenopathy. No pelvic sidewall lymphadenopathy. Reproductive: The uterus is surgically absent. No left adnexal mass. 4.3 cm benign appearing cystic lesion in the right adnexal space is stable in the interval. Other: No intraperitoneal free fluid. There is some edema in the porta hepatis and along the inferior right liver. Musculoskeletal: Diffuse body wall edema noted. Skin thickening noted lower anterior abdominal wall with superficial gas in the subcutaneous fat identified in the right lower quadrant of the anterior abdominal wall. Bones are diffusely demineralized. No worrisome lytic or sclerotic osseous abnormality. IMPRESSION: 1. Minimal edema identified in the hepato duodenal ligament/porta hepatis. Etiology not evident on this study, but duodenitis and pancreatitis not excluded. 2. Large gas volume in the urinary bladder. In the absence of recent bladder instrumentation, infection  should be considered. 3. Stable dilation of the extrahepatic common duct in this patient status post cholecystectomy. 4. Skin thickening lower anterior abdominal wall, right greater than left with subcutaneous superficial gas bubbles in the subcutaneous fat of the anterior right lower abdominal wall compatible with the reported wound. 5. Stable appearance of benign appearing right ovarian cyst measuring 4.3 cm is stable back to 09/03/2018. While interval stability is reassuring, patient age and cysts size indicate follow-up pelvic ultrasound to further evaluate. This recommendation follows ACR consensus guidelines: White Paper of the ACR Incidental Findings Committee II on Adnexal Findings. J Am Coll Radiol 2013:10:675-681. 6. Small  right and tiny left pleural effusion with some dependent atelectasis in the lower lobes bilaterally. 7.  Aortic Atherosclerois (ICD10-170.0) Electronically Signed   By: Misty Stanley M.D.   On: 01/10/2019 19:33   Dg Chest Port 1 View  Result Date: 01/10/2019 CLINICAL DATA:  Weakness EXAM: PORTABLE CHEST 1 VIEW COMPARISON:  08/16/2018 FINDINGS: Low volume AP portable examination. Cardiomegaly with mild pulmonary vascular prominence but without overt edema. No focal airspace opacity. IMPRESSION: Low volume AP portable examination. Cardiomegaly with mild pulmonary vascular prominence but without overt edema. No focal airspace opacity. Electronically Signed   By: Eddie Candle M.D.   On: 01/10/2019 17:22   Dg Abd 2 Views  Result Date: 01/11/2019 CLINICAL DATA:  Nausea, vomiting and right lower quadrant pain for 1 day. EXAM: ABDOMEN - 2 VIEW COMPARISON:  CT abdomen and pelvis 01/10/2019. FINDINGS: The bowel gas pattern is normal. There is no evidence of free air. No radio-opaque calculi or other significant radiographic abnormality is seen. Surgical clips in the right upper quadrant of the abdomen and intramedullary nail in the visualized left femur noted. IMPRESSION: No acute finding.  Electronically Signed   By: Inge Rise M.D.   On: 01/11/2019 14:52    Cardiac Studies   Echocardiogram 01/12/2019: Impressions: 1. The left ventricle has low normal systolic function, with an ejection fraction of 50-55%. The cavity size was normal. There is mild concentric left ventricular hypertrophy. Left ventricular diastolic Doppler parameters are consistent with  pseudonormalization. Elevated mean left atrial pressure There is right ventricular volume overload. No evidence of left ventricular regional wall motion abnormalities.  2. The right ventricle has normal systolic function. The cavity was normal. There is no increase in right ventricular wall thickness. Right ventricular systolic pressure is mildly elevated with an estimated pressure of 47.9 mmHg.  3. Left atrial size was moderately dilated.  4. The mitral valve is degenerative. Mild thickening of the mitral valve leaflet. There is severe mitral annular calcification present. Possible very mild mitral valve stenosis (normal gradients at slow heart rate of 58 bpm).  5. The tricuspid valve is grossly normal. Tricuspid valve regurgitation is moderate-severe.  6. The aortic valve is tricuspid. Moderate thickening of the aortic valve. Mild calcification of the aortic valve.  7. The aortic root and ascending aorta are normal in size and structure.  Patient Profile   [name] is a 68 y.o. female with a history of chronic diastolic CHF, atrial fibrillation on Eliquis, hypertension,  type 2 diabetes mellitus, chronic UTI, chronic non-healing abdominal wound, and morbid obesity who is being seen for evaluation of bradycardia and elevated troponin.   Assessment & Plan    Bradycardia  - Resolved. - Telemetry shows sinus rhythm with rates in the high 50's to 60's and 1st degree AV block. - Not felt to be related to patient's presenting symptoms. - Continue to hold Cardizem 1108m daily and Lopressor 523mdaily at home.  Paroxysmal  Atrial Fibrillation - Maintaining sinus rhythm. - Will continue to hold AV nodal agents.  - Continue Eliquis [id number] daily.  Elevated Troponin - Troponin minimally elevated and flat at 0.29 >> 0.25.  - ECG with T inversions anteriorly but significant unclear since older ECGs in rapid atrial fibrillation. - Echo shows LVEF of 50-55% with no regional wall motion abnormalities. - Not felt to be ACS. Possibly demand secondary to acute CHF and bradycardia.  Acute on Chronic Diastolic CHF - Chest x-ray showed cardiomegaly with mild pulmonary vascular prominence but  no overt edema. - BNP elevated at 2,107. - LVEF of 60-65% on Echo in 08/2018. - Echo shows LVEF of 50-55% with no regional wall motion abnormalities but elevated mean left atrial pressure, right ventricular volume overload, and a mildly elevated RVSP of 47.9 mmHg were noted. - Patient started on IV Lasix 31m twice daily on admission. No documented output or daily weights. Somewhat difficult to assess volume status due to patient's body habitus. - With worsening renal function, consider decreasing Lasix to 453mdaily. - Will need to start monitoring daily weight, strict I/O's, and renal function.  AKI - Serum creatinine 1.56 on admission and increased to 2.06. Baseline creatinine in the 0.7 to 1.1 range in recent months. - Continue to avoid Nephrotoxic agents. - Continue to monitor renal function closely.  Otherwise, per primary team: - Open Abdominal wound - DKA - COPD - Depression - Hypertension - Chronic UTI  For questions or updates, please contact [facility] Please consult [email or web address] for contact info under   Patient examined chart reviewed. No cardiac complaints Wound VAC in place morbidly obese Echo with normal EF no RWMAls No trend in troponin Telemetry with NSR no AV block rates 60's No need for further cardiac w/u Continue eliquis for PaF will arrange outpatient f/u with Dr KoJacinta Shoeill sign off

## 2019-01-12 NOTE — Progress Notes (Addendum)
PROGRESS NOTE    [name]  XIP:[id number] DOB: [birth year] DOA: 01/10/2019 PCP: [name], MD    Brief Narrative:   [name] is a 68 y.o. female with medical history significant of  morbid obesity, chronically ill with severe debility bed/wheelchair bound, chronic atrial fibrillation on Eliquis, diabetes mellitus type 2, essential hypertension, chronic UTI, superficial anterior abdominal wound, depression,  came to the hospital for evaluation of generalized weakness and low heart rate. Patient states for the past 10 days or so she has noticed bilateral lower extremity swelling and elevated blood glucose .   There was also evidence of chronic anterior abdominal wound in the right lower quadrant without evidence of superficial infection but CT of the abdomen pelvis was done to confirm no deeper infection.   Assessment & Plan:   Bradycardia: Resolved. Holding BB for now.   Acute on chronic diastolic CHF -Last echo with normal EF 50 to 55%, mild LVH, diastolic dysfunction noted -Cardiology following, currently on IV Lasix, monitor I's/O, daily weights, be met -Volume status appears much improved, will transition to oral diuretics from tomorrow, given bump in creatinine will hold additional diuretics today  AKI on CKD stage III -Baseline creatinine around 1.6, bumped up to 2 with diuresis, hold diuretics today  Elevated troponin:  -Possibly from demand ischemia.  -Echo with no new wall wall motion abnormality, no further cardiac work-up indicated at this time  History of recurrent UTIs, versus colonization -No fever or leukocytosis on admission, questionable symptoms of urinary incontinence and some clouding reported, unclear if this represents true infection -She was started on meropenem on admission, I have discontinued this today -We will monitor clinically -In addition CT abdomen pelvis reports gas in the urinary bladder, pt had a catheter placed in ED that day  for urine sample suspect air is related to this -Has also been seen by infectious disease recently for this  Type 2 diabetes Mellitus, insulin dependent : -A1c is 7.7 -BG stable, continue sliding scale insulin  Chronic atrial fibrillation:  Resume eliquis .  -Beta-blocker discontinued due to bradycardia on admission  Depression:  Resume lexapro.   Anemia of chronic disease:  -Anemia panel with severe iron deficiency -Give IV iron now, history of GI bleeding in January Transfuse to keep hemoglobin greater than 7.   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  Family Communication: none at bedside, discussed the plan with the patient.  Disposition Plan: pending clinical improvement.    Consultants:   Cardiology.   Procedures:  Echocardiogram.  CT abdomen and pelvis.   Antimicrobials: IV meropenam since admission.   Subjective: Persistent nausea and headache  Objective: Vitals:   01/11/19 1123 01/11/19 2227 01/11/19 2229 01/12/19 0752  BP: 138/66 133/74  140/71  Pulse: (!) 58 (!) 59  61  Resp: (!) 23 (!) 21  (!) 21  Temp: 97.6 F (36.4 C)  97.9 F (36.6 C) 99.2 F (37.3 C)  TempSrc: Oral  Oral Oral  SpO2: 100% 98%  98%  Weight:      Height:        Intake/Output Summary (Last 24 hours) at 01/12/2019 1442 Last data filed at 01/11/2019 2228 Gross per 24 hour  Intake 118 ml  Output --  Net 118 ml   Danley Danker  Weights   01/10/19 1526 01/10/19 2308  Weight: 105.7 kg 107.4 kg    Examination:  General exam: MODERATE distress from nausea and headache.  Respiratory system: diminished at bases, no wheezing heard.  Cardiovascular system: S1 & S2 heard, RRR. Pedal edema present.  Gastrointestinal system: Abdomen is soft , mildly tender,  distended, bowel sounds good, multiple purulent RL quadrant abd wall wounds Central nervous system: Alert and oriented. No focal neurological deficits. Extremities: bilateral pedal edema.  Skin: as above Psychiatry:  Mood & affect appropriate.     Data Reviewed: I have personally reviewed following labs and imaging studies  CBC: Recent Labs  Lab 01/10/19 1646 01/11/19 1106 01/12/19 0939  WBC 10.4 12.8* 10.2  NEUTROABS 8.6*  --   --   HGB 7.8* 7.4* 8.0*  HCT 27.4* 25.4* 27.3*  MCV 89.0 83.3 84.8  PLT 346 311 263   Basic Metabolic Panel: Recent Labs  Lab 01/10/19 1646 01/10/19 2020 01/11/19 1106 01/11/19 2053 01/12/19 0324  NA 134* 133* 135 131* 136  K 4.8 4.4 4.2 4.2 4.3  CL 98 98 101 98 102  CO2 16* 20* 23 25 25   GLUCOSE 331* 295* 165* 115* 92  BUN 28* 28* 28* 30* 31*  CREATININE 1.56* 1.55* 1.68* 2.00* 2.06*  CALCIUM 8.8* 8.4* 8.5* 8.0* 8.3*  MG 1.6*  --  1.7  --   --    GFR: Estimated Creatinine Clearance: 31.9 mL/min (A) (by C-G formula based on SCr of 2.06 mg/dL (H)). Liver Function Tests: Recent Labs  Lab 01/10/19 1646  AST 31  ALT 18  ALKPHOS 128*  BILITOT 1.6*  PROT 6.7  ALBUMIN 3.1*   Recent Labs  Lab 01/10/19 1646  LIPASE 17   No results for input(s): AMMONIA in the last 168 hours. Coagulation Profile: No results for input(s): INR, PROTIME in the last 168 hours. Cardiac Enzymes: Recent Labs  Lab 01/10/19 1646 01/10/19 2020  TROPONINI 0.29* 0.25*   BNP (last 3 results) No results for input(s): PROBNP in the last 8760 hours. HbA1C: No results for input(s): HGBA1C in the last 72 hours. CBG: Recent Labs  Lab 01/11/19 1210 01/11/19 1537 01/11/19 2225 01/12/19 0750 01/12/19 1218  GLUCAP 144* 134* 100* 72 103*   Lipid Profile: No results for input(s): CHOL, HDL, LDLCALC, TRIG, CHOLHDL, LDLDIRECT in the last 72 hours. Thyroid Function Tests: No results for input(s): TSH, T4TOTAL, FREET4, T3FREE, THYROIDAB in the last 72  hours. Anemia Panel: Recent Labs    01/12/19 0939  VITAMINB12 343  FOLATE 10.6  FERRITIN 33  TIBC 343  IRON 11*  RETICCTPCT 2.4   Sepsis Labs: Recent Labs  Lab 01/10/19 1646 01/10/19 1834 01/11/19 1349  LATICACIDVEN 6.2* 2.9* 1.7    Recent Results (from the past 240 hour(s))  Urine culture     Status: Abnormal (Preliminary result)   Collection Time: 01/10/19  3:46 PM  Result Value Ref Range Status   Specimen Description   Final    URINE, RANDOM Performed at [facility], [address], [place], [place] [address]    Special Requests   Final    NONE Performed at [facility], [address], [place], [place] [address]    Culture (A)  Final    >=100,000 COLONIES/mL GRAM NEGATIVE RODS 90,000 COLONIES/mL GRAM NEGATIVE RODS IDENTIFICATION AND SUSCEPTIBILITIES TO FOLLOW Performed at [facility], [place] [address] [place],  [address]    Report Status PENDING  Incomplete  SARS Coronavirus 2 (CEPHEID -  Performed in [facility] hospital lab), Hosp Order     Status: None   Collection Time: 01/10/19  3:49 PM  Result Value Ref Range Status   SARS Coronavirus 2 NEGATIVE NEGATIVE Final    Comment: (NOTE) If result is NEGATIVE SARS-CoV-2 target nucleic acids are NOT DETECTED. The SARS-CoV-2 RNA is generally detectable in upper and lower  respiratory specimens during the acute phase of infection. The lowest  concentration of SARS-CoV-2 viral copies this assay can detect is 250  copies / mL. A negative result does not preclude SARS-CoV-2 infection  and should not be used as the sole basis for treatment or other  patient management decisions.  A negative result may occur with  improper specimen collection / handling, submission of specimen other  than nasopharyngeal swab, presence of viral mutation(s) within the  areas targeted by this assay, and inadequate number of viral copies  (<250 copies / mL). A negative result must be combined with clinical  observations,  patient history, and epidemiological information. If result is POSITIVE SARS-CoV-2 target nucleic acids are DETECTED. The SARS-CoV-2 RNA is generally detectable in upper and lower  respiratory specimens dur ing the acute phase of infection.  Positive  results are indicative of active infection with SARS-CoV-2.  Clinical  correlation with patient history and other diagnostic information is  necessary to determine patient infection status.  Positive results do  not rule out bacterial infection or co-infection with other viruses. If result is PRESUMPTIVE POSTIVE SARS-CoV-2 nucleic acids MAY BE PRESENT.   A presumptive positive result was obtained on the submitted specimen  and confirmed on repeat testing.  While 2019 novel coronavirus  (SARS-CoV-2) nucleic acids may be present in the submitted sample  additional confirmatory testing may be necessary for epidemiological  and / or clinical management purposes  to differentiate between  SARS-CoV-2 and other Sarbecovirus currently known to infect humans.  If clinically indicated additional testing with an alternate test  methodology [phone]) is advised. The SARS-CoV-2 RNA is generally  detectable in upper and lower respiratory sp ecimens during the acute  phase of infection. The expected result is Negative. Fact Sheet for Patients:  StrictlyIdeas.no Fact Sheet for Healthcare Providers: BankingDealers.co.za This test is not yet approved or cleared by the [place] FDA and has been authorized for detection and/or diagnosis of SARS-CoV-2 by FDA under an Emergency Use Authorization (EUA).  This EUA will remain in effect (meaning this test can be used) for the duration of the COVID-19 declaration under Section 564(b)(1) of the Act, 21 U.S.C. section 360bbb-3(b)(1), unless the authorization is terminated or revoked sooner. Performed at [facility], [address], [place], [place] [address]           Radiology Studies: Ct Abdomen Pelvis Wo Contrast  Result Date: 01/10/2019 CLINICAL DATA:  3 day history of weakness with superficial wounds in the left lower quadrant abdominal wall. EXAM: CT ABDOMEN AND PELVIS WITHOUT CONTRAST TECHNIQUE: Multidetector CT imaging of the abdomen and pelvis was performed following the standard protocol without IV contrast. COMPARISON:  12/05/2018 FINDINGS: Lower chest: Tiny left and small right pleural effusions with dependent atelectasis in the lower lobes bilaterally. Hepatobiliary: No focal abnormality in the liver on this study without intravenous contrast. The liver shows diffusely decreased attenuation suggesting steatosis. Gallbladder is surgically absent. Extrahepatic common duct is dilated up to 17 mm diameter which is stable to 17 mm on the prior study. Pancreas: Diffusely atrophic without main duct dilatation. Spleen: No splenomegaly. No focal  mass lesion. Adrenals/Urinary Tract: No adrenal nodule or mass. Kidneys are atrophic bilaterally without hydronephrosis. No evidence for hydroureter. Prominent gas volume noted in the urinary bladder. Stomach/Bowel: Stomach is unremarkable. No gastric wall thickening. No evidence of outlet obstruction. Duodenum is normally positioned as is the ligament of Treitz. No small bowel wall thickening. No small bowel dilatation. The terminal ileum is normal. The appendix is not visualized, but there is no edema or inflammation in the region of the cecum. No gross colonic mass. No colonic wall thickening. Diverticular changes are noted in the left colon without evidence of diverticulitis. Vascular/Lymphatic: There is abdominal aortic atherosclerosis without aneurysm. There is no gastrohepatic or hepatoduodenal ligament lymphadenopathy. No intraperitoneal or retroperitoneal lymphadenopathy. No pelvic sidewall lymphadenopathy. Reproductive: The uterus is surgically absent. No left adnexal mass. 4.3 cm benign appearing cystic  lesion in the right adnexal space is stable in the interval. Other: No intraperitoneal free fluid. There is some edema in the porta hepatis and along the inferior right liver. Musculoskeletal: Diffuse body wall edema noted. Skin thickening noted lower anterior abdominal wall with superficial gas in the subcutaneous fat identified in the right lower quadrant of the anterior abdominal wall. Bones are diffusely demineralized. No worrisome lytic or sclerotic osseous abnormality. IMPRESSION: 1. Minimal edema identified in the hepato duodenal ligament/porta hepatis. Etiology not evident on this study, but duodenitis and pancreatitis not excluded. 2. Large gas volume in the urinary bladder. In the absence of recent bladder instrumentation, infection should be considered. 3. Stable dilation of the extrahepatic common duct in this patient status post cholecystectomy. 4. Skin thickening lower anterior abdominal wall, right greater than left with subcutaneous superficial gas bubbles in the subcutaneous fat of the anterior right lower abdominal wall compatible with the reported wound. 5. Stable appearance of benign appearing right ovarian cyst measuring 4.3 cm is stable back to 09/03/2018. While interval stability is reassuring, patient age and cysts size indicate follow-up pelvic ultrasound to further evaluate. This recommendation follows ACR consensus guidelines: White Paper of the ACR Incidental Findings Committee II on Adnexal Findings. J Am Coll Radiol 2013:10:675-681. 6. Small right and tiny left pleural effusion with some dependent atelectasis in the lower lobes bilaterally. 7.  Aortic Atherosclerois (ICD10-170.0) Electronically Signed   By: Misty Stanley M.D.   On: 01/10/2019 19:33   Dg Chest Port 1 View  Result Date: 01/10/2019 CLINICAL DATA:  Weakness EXAM: PORTABLE CHEST 1 VIEW COMPARISON:  08/16/2018 FINDINGS: Low volume AP portable examination. Cardiomegaly with mild pulmonary vascular prominence but without  overt edema. No focal airspace opacity. IMPRESSION: Low volume AP portable examination. Cardiomegaly with mild pulmonary vascular prominence but without overt edema. No focal airspace opacity. Electronically Signed   By: Eddie Candle M.D.   On: 01/10/2019 17:22   Dg Abd 2 Views  Result Date: 01/11/2019 CLINICAL DATA:  Nausea, vomiting and right lower quadrant pain for 1 day. EXAM: ABDOMEN - 2 VIEW COMPARISON:  CT abdomen and pelvis 01/10/2019. FINDINGS: The bowel gas pattern is normal. There is no evidence of free air. No radio-opaque calculi or other significant radiographic abnormality is seen. Surgical clips in the right upper quadrant of the abdomen and intramedullary nail in the visualized left femur noted. IMPRESSION: No acute finding. Electronically Signed   By: Inge Rise M.D.   On: 01/11/2019 14:52        Scheduled Meds:  apixaban  5 mg Oral BID   furosemide  40 mg Intravenous BID   insulin aspart  0-15  Units Subcutaneous TID WC   insulin aspart  0-5 Units Subcutaneous QHS   insulin detemir  10 Units Subcutaneous BID   Continuous Infusions:  meropenem (MERREM) IV 2 g (01/12/19 0420)     LOS: 2 days    Time spent: [place], MD [facility]  Password [facility] 01/12/2019, 2:42 PM

## 2019-01-13 ENCOUNTER — Encounter: Payer: BLUE CROSS/BLUE SHIELD | Admitting: Hematology

## 2019-01-13 ENCOUNTER — Other Ambulatory Visit: Payer: BLUE CROSS/BLUE SHIELD

## 2019-01-13 LAB — BASIC METABOLIC PANEL
Anion gap: 4 — ABNORMAL LOW (ref 5–15)
BUN: 30 mg/dL — ABNORMAL HIGH (ref 8–23)
CO2: 34 mmol/L — ABNORMAL HIGH (ref 22–32)
Calcium: 8.3 mg/dL — ABNORMAL LOW (ref 8.9–10.3)
Chloride: 101 mmol/L (ref 98–111)
Creatinine, Ser: 1.47 mg/dL — ABNORMAL HIGH (ref 0.44–1.00)
GFR calc Af Amer: 42 mL/min — ABNORMAL LOW (ref >60)
GFR calc non Af Amer: 36 mL/min — ABNORMAL LOW (ref >60)
Glucose, Bld: 120 mg/dL — ABNORMAL HIGH (ref 70–99)
Potassium: 4.4 mmol/L (ref 3.5–5.1)
Sodium: 139 mmol/L (ref 135–145)

## 2019-01-13 LAB — CBC
HCT: 27 % — ABNORMAL LOW (ref 36.0–46.0)
Hemoglobin: 7.9 g/dL — ABNORMAL LOW (ref 12.0–15.0)
MCH: 24.3 pg — ABNORMAL LOW (ref 26.0–34.0)
MCHC: 29.3 g/dL — ABNORMAL LOW (ref 30.0–36.0)
MCV: 83.1 fL (ref 80.0–100.0)
Platelets: 379 10*3/uL (ref 150–400)
RBC: 3.25 MIL/uL — ABNORMAL LOW (ref 3.87–5.11)
RDW: 15.5 % (ref 11.5–15.5)
WBC: 10.2 10*3/uL (ref 4.0–10.5)
nRBC: 1.8 % — ABNORMAL HIGH (ref 0.0–0.2)

## 2019-01-13 LAB — GLUCOSE, CAPILLARY
Glucose-Capillary: 115 mg/dL — ABNORMAL HIGH (ref 70–99)
Glucose-Capillary: 211 mg/dL — ABNORMAL HIGH (ref 70–99)
Glucose-Capillary: 148 mg/dL — ABNORMAL HIGH (ref 70–99)
Glucose-Capillary: 163 mg/dL — ABNORMAL HIGH (ref 70–99)

## 2019-01-13 MED ORDER — FUROSEMIDE 40 MG PO TABS
40.0000 mg | ORAL_TABLET | Freq: Every day | ORAL | Status: DC
  Administered 2019-01-13 – 2019-01-14 (×2): 40 mg via ORAL
  Filled 2019-01-13 (×3): qty 1

## 2019-01-13 MED ORDER — SODIUM CHLORIDE 0.9 % IV SOLN
510.0000 mg | Freq: Once | INTRAVENOUS | Status: AC
  Administered 2019-01-13: 11:00:00 510 mg via INTRAVENOUS
  Filled 2019-01-13: qty 17

## 2019-01-13 NOTE — Evaluation (Signed)
Occupational Therapy Evaluation Patient Details Name: [name] MRN: [id number] DOB: 02/24/1951 Today's Date: 01/13/2019    History of Present Illness Pt is a 68 year old woman admitted 01/10/19 with bradycardia, weakness, LE edema and elevated CBG. PMH: multiple hospital admissions, afib, DM, HTN, mobid obesity, chronic UTI, superficial anterior abdominal wound, depression.    Clinical Impression   Pt is able to walk 75 feet with RW and assist, but primarily functions from a w/c level with assist of her husband for ADL and heavy IADL. Pt is raising her 71 year old granddaughter. Pt with anxiety with standing and transfers due to hx of multiple falls. Pt likely very near her baseline. Will follow acutely.    Follow Up Recommendations  Home health OT    Equipment Recommendations  None recommended by OT    Recommendations for Other Services       Precautions / Restrictions Precautions Precautions: Fall Precaution Comments: pt with hx of multiple falls with fractures Restrictions Weight Bearing Restrictions: No      Mobility Bed Mobility Overal bed mobility: Needs Assistance Bed Mobility: Supine to Sit     Supine to sit: Min assist     General bed mobility comments: elevated trunk pulling on therapist's hand, use of rail  Transfers Overall transfer level: Needs assistance Equipment used: Rolling walker (2 wheeled) Transfers: Sit to/from [facility] Sit to Stand: +2 physical assistance;Min assist;From elevated surface Stand pivot transfers: Min guard;+2 physical assistance;+2 safety/equipment       General transfer comment: pt anxious with standing, insists on 2 person assist for safety due to hx of falls    Balance Overall balance assessment: Needs assistance   Sitting balance-Leahy Scale: Good       Standing balance-Leahy Scale: Poor Standing balance comment: dependent on B UE support of walker                           ADL  either performed or assessed with clinical judgement   ADL Overall ADL's : Needs assistance/impaired Eating/Feeding: Independent;Bed level   Grooming: Set up;Sitting   Upper Body Bathing: Minimal assistance;Sitting   Lower Body Bathing: Maximal assistance;Sit to/from stand   Upper Body Dressing : Set up;Sitting   Lower Body Dressing: Maximal assistance;Bed level;Sit to/from stand   Toilet Transfer: Min guard;+2 for safety/equipment;Stand-pivot;RW;BSC   Toileting- Clothing Manipulation and Hygiene: Maximal assistance;Min guard;Sit to/from stand               Vision Baseline Vision/History: Wears glasses Wears Glasses: Reading only Patient Visual Report: No change from baseline       Perception     Praxis      Pertinent Vitals/Pain Pain Assessment: No/denies pain     Hand Dominance Right   Extremity/Trunk Assessment Upper Extremity Assessment Upper Extremity Assessment: Overall WFL for tasks assessed   Lower Extremity Assessment Lower Extremity Assessment: Defer to PT evaluation   Cervical / Trunk Assessment Cervical / Trunk Assessment: Other exceptions Cervical / Trunk Exceptions: obesity   Communication Communication Communication: No difficulties   Cognition Arousal/Alertness: Awake/alert Behavior During Therapy: Anxious Overall Cognitive Status: Within Functional Limits for tasks assessed                                     General Comments       Exercises     Shoulder Instructions  Home Living Family/patient expects to be discharged to:: Private residence Living Arrangements: Spouse/significant other;Other relatives(67 year old granddaughter) Available Help at Discharge: Family;Available 24 hours/day;Available PRN/intermittently Type of Home: House Home Access: Ramped entrance     Home Layout: One level     Bathroom Shower/Tub: Tub/shower unit;Walk-in shower   Bathroom Toilet: Handicapped height     Home  Equipment: Walker - standard;Bedside commode;Grab bars - tub/shower;Shower seat;Wheelchair - Chartered loss adjuster - 2 wheels   Additional Comments: spouse works, 19 year old granddaughter is home with pt      Prior Functioning/Environment Level of Independence: Needs assistance  Gait / Transfers Assistance Needed: can walk 98 ft with RW and therapist ADL's / Homemaking Assistance Needed: assisted by spouse with LB ADL and IADL, wears slip on shoes with elastic laces, does not like socks            OT Problem List: Decreased strength;Decreased activity tolerance;Impaired balance (sitting and/or standing);Decreased safety awareness;Decreased knowledge of use of DME or AE;Obesity      OT Treatment/Interventions: Self-care/ADL training;DME and/or AE instruction;Patient/family education;Balance training;Therapeutic activities    OT Goals(Current goals can be found in the care plan section) Acute Rehab OT Goals Patient Stated Goal: return home OT Goal Formulation: With patient Time For Goal Achievement: 01/20/19 Potential to Achieve Goals: Good ADL Goals Pt Will Perform Upper Body Bathing: with set-up;sitting Pt Will Perform Upper Body Dressing: with set-up;sitting Pt Will Transfer to Toilet: with min guard assist;stand pivot transfer;bedside commode Pt Will Perform Toileting - Clothing Manipulation and hygiene: with min guard assist;sit to/from stand  OT Frequency: Min 2X/week   Barriers to D/C:            Co-evaluation PT/OT/SLP Co-Evaluation/Treatment: Yes Reason for Co-Treatment: For patient/therapist safety   OT goals addressed during session: ADL's and self-care      AM-PAC OT "6 Clicks" Daily Activity     Outcome Measure Help from another person eating meals?: None Help from another person taking care of personal grooming?: None Help from another person toileting, which includes using toliet, bedpan, or urinal?: A Lot Help from another person bathing (including  washing, rinsing, drying)?: A Lot Help from another person to put on and taking off regular upper body clothing?: None Help from another person to put on and taking off regular lower body clothing?: A Lot 6 Click Score: 18   End of Session Equipment Utilized During Treatment: Gait belt;Rolling walker Nurse Communication: Mobility status  Activity Tolerance: Patient tolerated treatment well Patient left: in chair;with call bell/phone within reach;with chair alarm set  OT Visit Diagnosis: Unsteadiness on feet (R26.81);Other abnormalities of gait and mobility (R26.89);Muscle weakness (generalized) (M62.81)                Time: 1694-5038 OT Time Calculation (min): 28 min Charges:  OT General Charges $OT Visit: 1 Visit OT Evaluation $OT Eval Moderate Complexity: 1 Mod  Nestor Lewandowsky, OTR/L Acute Rehabilitation Services Pager: [phone] Office: [phone]  Malka So 01/13/2019, 11:17 AM

## 2019-01-13 NOTE — Evaluation (Signed)
Physical Therapy Evaluation Patient Details Name: JAANVI FIZER MRN: [id number] DOB: 1951-06-17 Today's Date: 01/13/2019   History of Present Illness  Pt is a 68 year old woman admitted 01/10/19 with bradycardia, weakness, LE edema and elevated CBG. PMH: multiple hospital admissions, afib, DM, HTN, mobid obesity, chronic UTI, superficial anterior abdominal wound, depression.   Clinical Impression  Pt admitted secondary to problem above with deficits below. Pt currently requiring min to min guard A +2 (secondary to pt anxiety) for transfers to chair. Feel pt is likely close to baseline, as she uses WC for primary mobility. Had been working with PT on ambulation as well, however, reports they had signed off. Pt has support from husband at home. Recommend HHPT at d/c. Will continue to follow acutely to maximize functional mobility independence and safety.     Follow Up Recommendations Home health PT;Supervision/Assistance - 24 hour    Equipment Recommendations  None recommended by PT    Recommendations for Other Services       Precautions / Restrictions Precautions Precautions: Fall Precaution Comments: pt with hx of multiple falls with fractures Restrictions Weight Bearing Restrictions: No      Mobility  Bed Mobility Overal bed mobility: Needs Assistance Bed Mobility: Supine to Sit     Supine to sit: Min assist     General bed mobility comments: elevated trunk pulling on therapist's hand, use of rail  Transfers Overall transfer level: Needs assistance Equipment used: Rolling walker (2 wheeled) Transfers: Sit to/from [facility] Sit to Stand: +2 physical assistance;Min assist;From elevated surface Stand pivot transfers: Min guard;+2 physical assistance;+2 safety/equipment       General transfer comment: pt anxious with standing, insists on 2 person assist for safety due to hx of falls. Min guard A +2 for safety to transfer to chair. Pt refusing further  mobility secondary to anxiety.   Ambulation/Gait                Stairs            Wheelchair Mobility    Modified Rankin (Stroke Patients Only)       Balance Overall balance assessment: Needs assistance Sitting-balance support: Feet supported;No upper extremity supported Sitting balance-Leahy Scale: Good     Standing balance support: Bilateral upper extremity supported;During functional activity Standing balance-Leahy Scale: Poor Standing balance comment: dependent on B UE support of walker                             Pertinent Vitals/Pain Pain Assessment: No/denies pain    Home Living Family/patient expects to be discharged to:: Private residence Living Arrangements: Spouse/significant other;Other relatives(33 year old granddaughter) Available Help at Discharge: Family;Available 24 hours/day;Available PRN/intermittently Type of Home: House Home Access: Ramped entrance     Home Layout: One level Home Equipment: Walker - standard;Bedside commode;Grab bars - tub/shower;Shower seat;Wheelchair - manual;Tub bench;Walker - 2 wheels Additional Comments: spouse works, 63 year old granddaughter is home with pt    Prior Function Level of Independence: Needs assistance   Gait / Transfers Assistance Needed: can walk 46 ft with RW and therapist. Was able to transfer without assist to [facility]. Used WC for primary mobility.   ADL's / Homemaking Assistance Needed: assisted by spouse with LB ADL and IADL, wears slip on shoes with elastic laces, does not like socks        Hand Dominance   Dominant Hand: Right    Extremity/Trunk Assessment  Upper Extremity Assessment Upper Extremity Assessment: Defer to OT evaluation    Lower Extremity Assessment Lower Extremity Assessment: Generalized weakness    Cervical / Trunk Assessment Cervical / Trunk Assessment: Other exceptions Cervical / Trunk Exceptions: obesity  Communication   Communication: No  difficulties  Cognition Arousal/Alertness: Awake/alert Behavior During Therapy: Anxious Overall Cognitive Status: Within Functional Limits for tasks assessed                                        General Comments      Exercises     Assessment/Plan    PT Assessment Patient needs continued PT services  PT Problem List Decreased strength;Decreased balance;Decreased mobility;Decreased knowledge of use of DME;Decreased knowledge of precautions       PT Treatment Interventions DME instruction;Gait training;Functional mobility training;Therapeutic activities;Therapeutic exercise;Balance training;Patient/family education;Wheelchair mobility training    PT Goals (Current goals can be found in the Care Plan section)  Acute Rehab PT Goals Patient Stated Goal: return home PT Goal Formulation: With patient Time For Goal Achievement: 01/27/19 Potential to Achieve Goals: Good    Frequency Min 3X/week   Barriers to discharge        Co-evaluation PT/OT/SLP Co-Evaluation/Treatment: Yes Reason for Co-Treatment: For patient/therapist safety PT goals addressed during session: Mobility/safety with mobility;Balance;Proper use of DME         AM-PAC PT "6 Clicks" Mobility  Outcome Measure Help needed turning from your back to your side while in a flat bed without using bedrails?: A Little Help needed moving from lying on your back to sitting on the side of a flat bed without using bedrails?: A Little Help needed moving to and from a bed to a chair (including a wheelchair)?: A Little Help needed standing up from a chair using your arms (e.g., wheelchair or bedside chair)?: A Little Help needed to walk in hospital room?: A Little Help needed climbing 3-5 steps with a railing? : A Lot 6 Click Score: 17    End of Session Equipment Utilized During Treatment: Gait belt Activity Tolerance: Patient tolerated treatment well Patient left: in chair;with call bell/phone within  reach;with chair alarm set Nurse Communication: Mobility status PT Visit Diagnosis: Unsteadiness on feet (R26.81);Muscle weakness (generalized) (M62.81);History of falling (Z91.81)    Time: 3545-6256 PT Time Calculation (min) (ACUTE ONLY): 27 min   Charges:   PT Evaluation $PT Eval Moderate Complexity: [place], PT, DPT  Acute Rehabilitation Services  Pager: [phone] Office: [phone]   Rudean Hitt 01/13/2019, 3:16 PM

## 2019-01-13 NOTE — Progress Notes (Signed)
PROGRESS NOTE    [name]  LKT:[id number] DOB: [birth year] DOA: 01/10/2019 PCP: [name], MD    Brief Narrative:   [name] is a 68 y.o. female with medical history significant of  morbid obesity, chronically ill with severe debility bed/wheelchair bound, chronic atrial fibrillation on Eliquis, diabetes mellitus type 2, essential hypertension, chronic UTI, superficial anterior abdominal wound, depression,  came to the hospital for evaluation of generalized weakness and low heart rate. Patient states for the past 10 days or so she has noticed bilateral lower extremity swelling and elevated blood glucose .   There was also evidence of chronic anterior abdominal wound in the right lower quadrant without evidence of superficial infection but CT of the abdomen pelvis was done to confirm no deeper infection.   Assessment & Plan:   Bradycardia: Resolved. Holding BB for now.   Acute on chronic diastolic CHF -Last echo with normal EF 50 to 55%, mild LVH, diastolic dysfunction noted -Cardiology following, improved with IV Lasix, -Given bump in creatinine diuretics were held yesterday -We will restart oral Lasix today, monitor urine output and be met -Ambulate, out of bed, PT OT  AKI on CKD stage III -Baseline creatinine around 1.6, bumped up to 2 with diuresis, now down to 1.4  Elevated troponin:  -Possibly from demand ischemia.  -Echo with no new wall wall motion abnormality, no further cardiac work-up indicated at this time  History of recurrent UTIs, versus colonization -No fever or leukocytosis on admission, questionable symptoms of urinary incontinence and some clouding reported, unclear if this represents true infection -She was started on meropenem on admission, discontinued meropenem yesterday 5/27, clinically appears stable  -In addition CT abdomen pelvis reports gas in the urinary bladder, pt had a catheter placed in ED that day for urine sample suspect air  is related to this -Has also been seen by infectious disease recently for this, history suggestive of fistula  Type 2 diabetes Mellitus, insulin dependent : -A1c is 7.7 -BG stable, continue sliding scale insulin  Chronic atrial fibrillation:  Resume eliquis .  -Beta-blocker discontinued due to bradycardia on admission  Depression:  Resume lexapro.   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  Family Communication: none at bedside, discussed the plan with the patient.  Disposition Plan: Home versus SNF   Consultants:   Cardiology.   Procedures:  Echocardiogram.  CT abdomen and pelvis.    Subjective: -Feels better today, able to tolerate diet, breathing is improved  Objective: Vitals:   01/13/19 0630 01/13/19 0817 01/13/19 0826 01/13/19 1153  BP:  (!) 151/62  (!) 142/55  Pulse:  67 71 74  Resp:  19 (!) 21 19  Temp: 98.8 F (37.1 C) (!) 97.4 F (36.3 C)  (!) 97.5 F (36.4 C)  TempSrc: Oral Oral  Oral  SpO2:  100% 100% 97%  Weight: 108.3 kg     Height:  Intake/Output Summary (Last 24 hours) at 01/13/2019 1329 Last data filed at 01/13/2019 6568 Gross per 24 hour  Intake 240 ml  Output 1200 ml  Net -960 ml   Filed Weights   01/10/19 1526 01/10/19 2308 01/13/19 0630  Weight: 105.7 kg 107.4 kg 108.3 kg    Examination:  Gen: Obese chronically ill female laying in bed, no  distress today ,awake, Alert, Oriented X 3,  HEENT:  Lungs: Decreased breath sounds at both bases CVS: RRR,No Gallops,Rubs or new Murmurs Abd: soft, Non tender, non distended, BS present, lower abdominal wall-with multiple small wounds with purulence, and indurated area Extremities: No edema Skin: As noted above  Psychiatry:  Mood & affect appropriate.     Data Reviewed: I have personally reviewed following labs and imaging studies  CBC: Recent Labs  Lab 01/10/19 1646 01/11/19 1106 01/12/19 0939 01/13/19 0750  WBC 10.4 12.8* 10.2 10.2  NEUTROABS 8.6*  --   --   --   HGB 7.8* 7.4* 8.0* 7.9*  HCT 27.4* 25.4* 27.3* 27.0*  MCV 89.0 83.3 84.8 83.1  PLT 346 311 362 127   Basic Metabolic Panel: Recent Labs  Lab 01/10/19 1646 01/10/19 2020 01/11/19 1106 01/11/19 2053 01/12/19 0324 01/13/19 0750  NA 134* 133* 135 131* 136 139  K 4.8 4.4 4.2 4.2 4.3 4.4  CL 98 98 101 98 102 101  CO2 16* 20* 23 25 25  34*  GLUCOSE 331* 295* 165* 115* 92 120*  BUN 28* 28* 28* 30* 31* 30*  CREATININE 1.56* 1.55* 1.68* 2.00* 2.06* 1.47*  CALCIUM 8.8* 8.4* 8.5* 8.0* 8.3* 8.3*  MG 1.6*  --  1.7  --   --   --    GFR: Estimated Creatinine Clearance: 44.8 mL/min (A) (by C-G formula based on SCr of 1.47 mg/dL (H)). Liver Function Tests: Recent Labs  Lab 01/10/19 1646  AST 31  ALT 18  ALKPHOS 128*  BILITOT 1.6*  PROT 6.7  ALBUMIN 3.1*   Recent Labs  Lab 01/10/19 1646  LIPASE 17   No results for input(s): AMMONIA in the last 168 hours. Coagulation Profile: No results for input(s): INR, PROTIME in the last 168 hours. Cardiac Enzymes: Recent Labs  Lab 01/10/19 1646 01/10/19 2020  TROPONINI 0.29* 0.25*   BNP (last 3 results) No results for input(s): PROBNP in the last 8760 hours. HbA1C: No results for input(s): HGBA1C in the last 72 hours. CBG: Recent Labs  Lab 01/12/19 1218 01/12/19 1614 01/12/19 2230 01/13/19 0816 01/13/19 1151  GLUCAP 103* 174* 209* 115* 211*   Lipid  Profile: No results for input(s): CHOL, HDL, LDLCALC, TRIG, CHOLHDL, LDLDIRECT in the last 72 hours. Thyroid Function Tests: No results for input(s): TSH, T4TOTAL, FREET4, T3FREE, THYROIDAB in the last 72 hours. Anemia Panel: Recent Labs    01/12/19 0939  VITAMINB12 343  FOLATE 10.6  FERRITIN 33  TIBC 343  IRON 11*  RETICCTPCT 2.4   Sepsis Labs: Recent Labs  Lab 01/10/19 1646 01/10/19 1834 01/11/19 1349  LATICACIDVEN 6.2* 2.9* 1.7    Recent Results (from the past 240 hour(s))  Urine culture     Status: Abnormal (Preliminary result)   Collection Time: 01/10/19  3:46 PM  Result Value Ref Range Status   Specimen Description   Final    URINE, RANDOM Performed at [facility], [address] [place], [place] [address]    Special Requests   Final    NONE Performed at [facility], [address] [place],  [place] [address]    Culture (A)  Final    >=100,000 COLONIES/mL GRAM NEGATIVE RODS 90,000 COLONIES/mL GRAM NEGATIVE RODS IDENTIFICATION AND SUSCEPTIBILITIES TO FOLLOW Performed at [facility], [address] [place], [place] [address]    Report Status PENDING  Incomplete  SARS Coronavirus 2 (CEPHEID - Performed in [place] hospital lab), Hosp Order     Status: None   Collection Time: 01/10/19  3:49 PM  Result Value Ref Range Status   SARS Coronavirus 2 NEGATIVE NEGATIVE Final    Comment: (NOTE) If result is NEGATIVE SARS-CoV-2 target nucleic acids are NOT DETECTED. The SARS-CoV-2 RNA is generally detectable in upper and lower  respiratory specimens during the acute phase of infection. The lowest  concentration of SARS-CoV-2 viral copies this assay can detect is 250  copies / mL. A negative result does not preclude SARS-CoV-2 infection  and should not be used as the sole basis for treatment or other  patient management decisions.  A negative result may occur with  improper specimen collection / handling, submission of specimen other  than nasopharyngeal  swab, presence of viral mutation(s) within the  areas targeted by this assay, and inadequate number of viral copies  (<250 copies / mL). A negative result must be combined with clinical  observations, patient history, and epidemiological information. If result is POSITIVE SARS-CoV-2 target nucleic acids are DETECTED. The SARS-CoV-2 RNA is generally detectable in upper and lower  respiratory specimens dur ing the acute phase of infection.  Positive  results are indicative of active infection with SARS-CoV-2.  Clinical  correlation with patient history and other diagnostic information is  necessary to determine patient infection status.  Positive results do  not rule out bacterial infection or co-infection with other viruses. If result is PRESUMPTIVE POSTIVE SARS-CoV-2 nucleic acids MAY BE PRESENT.   A presumptive positive result was obtained on the submitted specimen  and confirmed on repeat testing.  While 2019 novel coronavirus  (SARS-CoV-2) nucleic acids may be present in the submitted sample  additional confirmatory testing may be necessary for epidemiological  and / or clinical management purposes  to differentiate between  SARS-CoV-2 and other Sarbecovirus currently known to infect humans.  If clinically indicated additional testing with an alternate test  methodology [phone]) is advised. The SARS-CoV-2 RNA is generally  detectable in upper and lower respiratory sp ecimens during the acute  phase of infection. The expected result is Negative. Fact Sheet for Patients:  StrictlyIdeas.no Fact Sheet for Healthcare Providers: BankingDealers.co.za This test is not yet approved or cleared by the [place] FDA and has been authorized for detection and/or diagnosis of SARS-CoV-2 by FDA under an Emergency Use Authorization (EUA).  This EUA will remain in effect (meaning this test can be used) for the duration of the COVID-19 declaration  under Section 564(b)(1) of the Act, 21 U.S.C. section 360bbb-3(b)(1), unless the authorization is terminated or revoked sooner. Performed at [facility], [address], [place], [place] [address]          Radiology Studies: Dg Abd 2 Views  Result Date: 01/11/2019 CLINICAL DATA:  Nausea, vomiting and right lower quadrant pain for 1 day. EXAM: ABDOMEN - 2 VIEW COMPARISON:  CT abdomen and pelvis 01/10/2019. FINDINGS: The bowel gas pattern is normal. There is no evidence of free air. No radio-opaque calculi or other significant radiographic abnormality is seen. Surgical clips in the right upper quadrant of the abdomen and intramedullary nail in the visualized left femur noted.  IMPRESSION: No acute finding. Electronically Signed   By: Inge Rise M.D.   On: 01/11/2019 14:52        Scheduled Meds: . apixaban  5 mg Oral BID  . doxycycline  100 mg Oral Q12H  . insulin aspart  0-15 Units Subcutaneous TID WC  . insulin aspart  0-5 Units Subcutaneous QHS  . insulin detemir  10 Units Subcutaneous BID   Continuous Infusions: . ferumoxytol Stopped (01/13/19 1113)     LOS: 3 days    Time spent: [place], MD [facility]  Password [facility] 01/13/2019, 1:29 PM

## 2019-01-14 ENCOUNTER — Encounter (HOSPITAL_BASED_OUTPATIENT_CLINIC_OR_DEPARTMENT_OTHER): Payer: BLUE CROSS/BLUE SHIELD | Attending: Internal Medicine

## 2019-01-14 LAB — BASIC METABOLIC PANEL
Anion gap: 11 (ref 5–15)
BUN: 25 mg/dL — ABNORMAL HIGH (ref 8–23)
CO2: 29 mmol/L (ref 22–32)
Calcium: 8.7 mg/dL — ABNORMAL LOW (ref 8.9–10.3)
Chloride: 98 mmol/L (ref 98–111)
Creatinine, Ser: 1.18 mg/dL — ABNORMAL HIGH (ref 0.44–1.00)
GFR calc Af Amer: 55 mL/min — ABNORMAL LOW (ref >60)
GFR calc non Af Amer: 47 mL/min — ABNORMAL LOW (ref >60)
Glucose, Bld: 169 mg/dL — ABNORMAL HIGH (ref 70–99)
Potassium: 4.3 mmol/L (ref 3.5–5.1)
Sodium: 138 mmol/L (ref 135–145)

## 2019-01-14 LAB — CBC
HCT: 27.5 % — ABNORMAL LOW (ref 36.0–46.0)
Hemoglobin: 8 g/dL — ABNORMAL LOW (ref 12.0–15.0)
MCH: 24.7 pg — ABNORMAL LOW (ref 26.0–34.0)
MCHC: 29.1 g/dL — ABNORMAL LOW (ref 30.0–36.0)
MCV: 84.9 fL (ref 80.0–100.0)
Platelets: 393 10*3/uL (ref 150–400)
RBC: 3.24 MIL/uL — ABNORMAL LOW (ref 3.87–5.11)
RDW: 15.5 % (ref 11.5–15.5)
WBC: 10.8 10*3/uL — ABNORMAL HIGH (ref 4.0–10.5)
nRBC: 1.6 % — ABNORMAL HIGH (ref 0.0–0.2)

## 2019-01-14 LAB — GLUCOSE, CAPILLARY
Glucose-Capillary: 143 mg/dL — ABNORMAL HIGH (ref 70–99)
Glucose-Capillary: 151 mg/dL — ABNORMAL HIGH (ref 70–99)
Glucose-Capillary: 148 mg/dL — ABNORMAL HIGH (ref 70–99)
Glucose-Capillary: 115 mg/dL — ABNORMAL HIGH (ref 70–99)

## 2019-01-14 MED ORDER — METOPROLOL TARTRATE 25 MG PO TABS
25.0000 mg | ORAL_TABLET | Freq: Two times a day (BID) | ORAL | Status: DC
  Administered 2019-01-14 – 2019-01-16 (×5): 25 mg via ORAL
  Filled 2019-01-14 (×5): qty 1

## 2019-01-14 NOTE — Progress Notes (Signed)
Occupational Therapy Treatment Patient Details Name: [name] MRN: [id number] DOB: 08/01/1951 Today's Date: 01/14/2019    History of present illness Pt is a 68 year old woman admitted 01/10/19 with bradycardia, weakness, LE edema and elevated CBG. PMH: multiple hospital admissions, afib, DM, HTN, mobid obesity, chronic UTI, superficial anterior abdominal wound, depression.    OT comments  Pt not willing to get OOB to chair due to incontinence and desire to remain in bed with purewick despite encouragement and education in benefits. Pt sat EOB and ate [place] ice and then performed seated grooming with set up. Returned to supine at end of session.   Follow Up Recommendations  Home health OT    Equipment Recommendations  None recommended by OT    Recommendations for Other Services      Precautions / Restrictions Precautions Precautions: Fall Precaution Comments: pt with hx of multiple falls with fractures       Mobility Bed Mobility Overal bed mobility: Needs Assistance Bed Mobility: Supine to Sit;Sit to Supine     Supine to sit: Min assist Sit to supine: Min assist      Transfers                 General transfer comment: pt refusing to practice sit<>stand or transfer to chair. Reports having urinary incontinence in chair yesterday, did not want to return today despite maximum encouragement.    Balance Overall balance assessment: Needs assistance   Sitting balance-Leahy Scale: Good                                     ADL either performed or assessed with clinical judgement   ADL Overall ADL's : Needs assistance/impaired     Grooming: Wash/dry hands;Wash/dry face;Oral care;Sitting;Set up               Lower Body Dressing: Set up;Sitting/lateral leans Lower Body Dressing Details (indicate cue type and reason): slip on shoes with elastic laces                     Vision       Perception     Praxis      Cognition  Arousal/Alertness: Awake/alert Behavior During Therapy: Anxious Overall Cognitive Status: Within Functional Limits for tasks assessed                                          Exercises     Shoulder Instructions       General Comments      Pertinent Vitals/ Pain       Pain Assessment: No/denies pain  Home Living                                          Prior Functioning/Environment              Frequency  Min 2X/week        Progress Toward Goals  OT Goals(current goals can now be found in the care plan section)  Progress towards OT goals: Progressing toward goals  Acute Rehab OT Goals Patient Stated Goal: return home OT Goal Formulation: With patient Time For Goal Achievement: 01/20/19 Potential to Achieve Goals:  Good  Plan Discharge plan remains appropriate    Co-evaluation                 AM-PAC OT "6 Clicks" Daily Activity     Outcome Measure   Help from another person eating meals?: None Help from another person taking care of personal grooming?: None Help from another person toileting, which includes using toliet, bedpan, or urinal?: A Lot Help from another person bathing (including washing, rinsing, drying)?: A Lot Help from another person to put on and taking off regular upper body clothing?: None Help from another person to put on and taking off regular lower body clothing?: A Little 6 Click Score: 19    End of Session    OT Visit Diagnosis: Unsteadiness on feet (R26.81);Other abnormalities of gait and mobility (R26.89);Muscle weakness (generalized) (M62.81)   Activity Tolerance Patient tolerated treatment well   Patient Left in bed;with call bell/phone within reach   Nurse Communication          Time: 9323-5573 OT Time Calculation (min): 15 min  Charges: OT General Charges $OT Visit: 1 Visit OT Treatments $Self Care/Home Management : 8-22 mins  Nestor Lewandowsky, OTR/L Acute Rehabilitation  Services Pager: [phone] Office: [phone] Malka So 01/14/2019, 12:36 PM

## 2019-01-14 NOTE — Consult Note (Signed)
   [facility] Inpatient Consult   01/14/2019  [name] 02/06/1951 [id number]    Patient screened for48% extreme high riskscorefor unplanned readmission, with 5 hospitalizations and 1 ED visitin thepast 6 months as benefit of her [employer] plan; and for [employer] services.  Per chart review and MD note on 01/12/19 state as follows: [name] is a 68 y.o. female with a history of chronic diastolic CHF, atrial fibrillation on Eliquis, hypertension, type 2 diabetes mellitus, chronic UTI, chronic non-healing abdominal wound, and morbid obesity,  who is being seen for generalized weakness, low heart rate and evaluation of bradycardia and elevated troponin.  (Acute on Chronic Diastolic CHF, Paroxysmal Atrial Fibrillation) Patient has noticed bilateral lower extremity swelling and elevated blood glucose, also with chronic anterior abdominal wound in the right lower quadrant without evidence of superficial infection but CT of the abdomen pelvis was done to confirm no deeper infection.   Chart review of PT/ OT notes recommend to transition to home with home health services.  Called and spoke with patient over the hospital phone (HIPAA verified) to follow-up post discharge needs. Patient confirmed that her primary care provider is Dr. [name] with [facility] at [facility], listed as providing transition of care follow-up. She reports living with husband (primary caregiver) and her 30 year old granddaughter whom they are taking care of.  Patient had indicated having no current needs or concerns with medications, pharmacy and transportation (husband). However, she had voiced interest with [facility] care management services to further assist her in managing her health conditions (HF, DM type 2). She reports regular monitoring of her weight and blood sugar but have not been recording results to bring to provider's visit. Patient also  mentioned need to follow diet restrictions since she "has access to all kinds of food at home". Patient verbally agreed to be referred to [facility] care management services for complex disease management.  Referral made for [facility] community care coordinator for complex care, disease management and assess further needs post discharge.   For questions and additional information, please contact:  Zaeden Lastinger A. Hiyab Nhem, BSN, RN-BC [facility] Liaison Cell: [phone]

## 2019-01-14 NOTE — Progress Notes (Signed)
PROGRESS NOTE    [name]  UXN:[id number] DOB: [birth year] DOA: 01/10/2019 PCP: [name], MD    Brief Narrative:   TAHJAE Torres is a 68 y.o. female with medical history significant of  morbid obesity, chronically ill with severe debility bed/wheelchair bound, chronic atrial fibrillation on Eliquis, diabetes mellitus type 2, essential hypertension, chronic UTI, superficial anterior abdominal wound, depression,  came to the hospital for evaluation of generalized weakness and low heart rate. Patient states for the past 10 days or so she has noticed bilateral lower extremity swelling and elevated blood glucose .   There was also evidence of chronic anterior abdominal wound in the right lower quadrant without evidence of superficial infection but CT of the abdomen pelvis was done to confirm no deeper infection.   Assessment & Plan:   Bradycardia: Resolved, now HR in 80-90s, will resume BB at lower dose and monitor  Acute on chronic diastolic CHF -Last echo with normal EF 50 to 55%, mild LVH, diastolic dysfunction noted -Cardiology following, improved with IV Lasix, -Status has improved, diuretics were held temporarily due to bump in creatinine, subsequently transitioned to oral diuretics -Continue Lasix 40 mg daily, currently takes 20 mg at home -Continue increased activity ambulation, physical therapy  AKI on CKD stage III -Baseline creatinine around 1.6, bumped up to 2 with diuresis, now down to 1.4  Elevated troponin:  -Possibly from demand ischemia.  -Echo with no new wall wall motion abnormality, no further cardiac work-up indicated at this time  History of recurrent UTIs, versus colonization -On admission she was afebrile with no leukocytosis, she has urinary frequency and incontinence likely from diuretics  -Do not think represents true infection hence discontinued meropenem 2 days ago , remained stable and afebrile off antibiotics  -In addition CT abdomen  pelvis reportedgas in the urinary bladder, pt had a catheter placed in ED that day for urine sample suspect air is related to this -Has also been seen by infectious disease recently for this, history suggestive of fistula -encouraged hygiene  Type 2 diabetes Mellitus, insulin dependent : -A1c is 7.7 -BG stable, continue sliding scale insulin  Chronic atrial fibrillation:  Resume eliquis .  -Beta-blocker discontinued due to bradycardia, restart beta-blocker at a lower dose  Depression:  Resume lexapro.   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  Family Communication: none at bedside, discussed the plan with the patient.  Disposition Plan: Home with [facility] tomorrow   Consultants:   Cardiology.   Procedures:  Echocardiogram.  CT abdomen and pelvis.    Subjective: -feels better, tolerating diet, nausea resolved  Objective: Vitals:   01/13/19 0826 01/13/19 1153 01/13/19 2038 01/14/19 0655  BP:  (!) 142/55 (!) 149/56 (!) 149/64  Pulse: 71 74 82 80  Resp: (!) 21 19 16 18   Temp:  (!) 97.5 F (36.4 C) 99 F (37.2 C) 98.9 F (37.2 C)  TempSrc:  Oral Oral Oral  SpO2: 100% 97% 100% 100%  Weight:    107.4 kg  Height:        Intake/Output Summary (Last 24 hours) at 01/14/2019 1449 Last data filed at 01/14/2019 0700 Gross per 24 hour  Intake 240 ml  Output 2400 ml  Net -2160 ml   Filed Weights    01/10/19 2308 01/13/19 0630 01/14/19 0655  Weight: 107.4 kg 108.3 kg 107.4 kg    Examination:  Gen: Bees, chronically ill female, laying in bed, no distress awake alert oriented x3 HEENT: PERRLA, Neck supple, no JVD Lungs: Clear anteriorly CVS: RRR,No Gallops,Rubs or new Murmurs Abd: Soft, obese, nontender, nondistended, lower abdominal wall with multiple small abdominal wounds with areas of purulence and induration,  Extremities: No edema Skin: no new rashes psychiatry:  Mood & affect appropriate.     Data Reviewed: I have personally reviewed following labs and imaging studies  CBC: Recent Labs  Lab 01/10/19 1646 01/11/19 1106 01/12/19 0939 01/13/19 0750 01/14/19 0644  WBC 10.4 12.8* 10.2 10.2 10.8*  NEUTROABS 8.6*  --   --   --   --   HGB 7.8* 7.4* 8.0* 7.9* 8.0*  HCT 27.4* 25.4* 27.3* 27.0* 27.5*  MCV 89.0 83.3 84.8 83.1 84.9  PLT 346 311 362 379 762   Basic Metabolic Panel: Recent Labs  Lab 01/10/19 1646  01/11/19 1106 01/11/19 2053 01/12/19 0324 01/13/19 0750 01/14/19 0644  NA 134*   < > 135 131* 136 139 138  K 4.8   < > 4.2 4.2 4.3 4.4 4.3  CL 98   < > 101 98 102 101 98  CO2 16*   < > 23 25 25  34* 29  GLUCOSE 331*   < > 165* 115* 92 120* 169*  BUN 28*   < > 28* 30* 31* 30* 25*  CREATININE 1.56*   < > 1.68* 2.00* 2.06* 1.47* 1.18*  CALCIUM 8.8*   < > 8.5* 8.0* 8.3* 8.3* 8.7*  MG 1.6*  --  1.7  --   --   --   --    < > = values in this interval not displayed.   GFR: Estimated Creatinine Clearance: 55.6 mL/min (A) (by C-G formula based on SCr of 1.18 mg/dL (H)). Liver Function Tests: Recent Labs  Lab 01/10/19 1646  AST 31  ALT 18  ALKPHOS 128*  BILITOT 1.6*  PROT 6.7  ALBUMIN 3.1*   Recent Labs  Lab 01/10/19 1646  LIPASE 17   No results for input(s): AMMONIA in the last 168 hours. Coagulation Profile: No results for input(s): INR, PROTIME in the last 168 hours. Cardiac Enzymes: Recent Labs  Lab 01/10/19 1646 01/10/19 2020  TROPONINI  0.29* 0.25*   BNP (last 3 results) No results for input(s): PROBNP in the last 8760 hours. HbA1C: No results for input(s): HGBA1C in the last 72 hours. CBG: Recent Labs  Lab 01/13/19 1151 01/13/19 1727 01/13/19 2202 01/14/19 0805 01/14/19 1135  GLUCAP 211* 148* 163* 143* 151*   Lipid Profile: No results for input(s): CHOL, HDL, LDLCALC, TRIG, CHOLHDL, LDLDIRECT in the last 72 hours. Thyroid Function Tests: No results for input(s): TSH, T4TOTAL, FREET4, T3FREE, THYROIDAB in the last 72 hours. Anemia Panel: Recent Labs    01/12/19 0939  VITAMINB12 343  FOLATE 10.6  FERRITIN 33  TIBC 343  IRON 11*  RETICCTPCT 2.4   Sepsis Labs: Recent Labs  Lab 01/10/19 1646 01/10/19 1834 01/11/19 1349  LATICACIDVEN 6.2* 2.9* 1.7  Recent Results (from the past 240 hour(s))  Urine culture     Status: Abnormal (Preliminary result)   Collection Time: 01/10/19  3:46 PM  Result Value Ref Range Status   Specimen Description   Final    URINE, RANDOM Performed at [facility], [address] [place], [place] [address]    Special Requests   Final    NONE Performed at [facility], [address] [place], [place] [address]    Culture (A)  Final    >=100,000 COLONIES/mL ESCHERICHIA COLI 90,000 COLONIES/mL GRAM NEGATIVE RODS ORGANISM 1 Confirmed Extended Spectrum Beta-Lactamase Producer (ESBL).  In bloodstream infections from ESBL organisms, carbapenems are preferred over piperacillin/tazobactam. They are shown to have a lower risk of mortality. ORGANISM 2 IDENTIFICATION AND SUSCEPTIBILITIES TO FOLLOW REPEATING ORGANISM 2 Performed at [facility], [place] [address], [place], [place] [address]    Report Status PENDING  Incomplete   Organism ID, Bacteria ESCHERICHIA COLI (A)  Final      Susceptibility   Escherichia coli - MIC*    AMPICILLIN >=32 RESISTANT Resistant     CEFAZOLIN >=64 RESISTANT Resistant     CEFTRIAXONE >=64 RESISTANT Resistant     CIPROFLOXACIN >=4 RESISTANT  Resistant     GENTAMICIN <=1 SENSITIVE Sensitive     IMIPENEM <=0.25 SENSITIVE Sensitive     NITROFURANTOIN 64 INTERMEDIATE Intermediate     TRIMETH/SULFA >=320 RESISTANT Resistant     AMPICILLIN/SULBACTAM >=32 RESISTANT Resistant     PIP/TAZO 8 SENSITIVE Sensitive     Extended ESBL POSITIVE Resistant     * >=100,000 COLONIES/mL ESCHERICHIA COLI  SARS Coronavirus 2 (CEPHEID - Performed in [place] hospital lab), Hosp Order     Status: None   Collection Time: 01/10/19  3:49 PM  Result Value Ref Range Status   SARS Coronavirus 2 NEGATIVE NEGATIVE Final    Comment: (NOTE) If result is NEGATIVE SARS-CoV-2 target nucleic acids are NOT DETECTED. The SARS-CoV-2 RNA is generally detectable in upper and lower  respiratory specimens during the acute phase of infection. The lowest  concentration of SARS-CoV-2 viral copies this assay can detect is 250  copies / mL. A negative result does not preclude SARS-CoV-2 infection  and should not be used as the sole basis for treatment or other  patient management decisions.  A negative result may occur with  improper specimen collection / handling, submission of specimen other  than nasopharyngeal swab, presence of viral mutation(s) within the  areas targeted by this assay, and inadequate number of viral copies  (<250 copies / mL). A negative result must be combined with clinical  observations, patient history, and epidemiological information. If result is POSITIVE SARS-CoV-2 target nucleic acids are DETECTED. The SARS-CoV-2 RNA is generally detectable in upper and lower  respiratory specimens dur ing the acute phase of infection.  Positive  results are indicative of active infection with SARS-CoV-2.  Clinical  correlation with patient history and other diagnostic information is  necessary to determine patient infection status.  Positive results do  not rule out bacterial infection or co-infection with other viruses. If result is PRESUMPTIVE  POSTIVE SARS-CoV-2 nucleic acids MAY BE PRESENT.   A presumptive positive result was obtained on the submitted specimen  and confirmed on repeat testing.  While 2019 novel coronavirus  (SARS-CoV-2) nucleic acids may be present in the submitted sample  additional confirmatory testing may be necessary for epidemiological  and / or clinical management purposes  to differentiate between  SARS-CoV-2  and other Sarbecovirus currently known to infect humans.  If clinically indicated additional testing with an alternate test  methodology [phone]) is advised. The SARS-CoV-2 RNA is generally  detectable in upper and lower respiratory sp ecimens during the acute  phase of infection. The expected result is Negative. Fact Sheet for Patients:  StrictlyIdeas.no Fact Sheet for Healthcare Providers: BankingDealers.co.za This test is not yet approved or cleared by the [place] FDA and has been authorized for detection and/or diagnosis of SARS-CoV-2 by FDA under an Emergency Use Authorization (EUA).  This EUA will remain in effect (meaning this test can be used) for the duration of the COVID-19 declaration under Section 564(b)(1) of the Act, 21 U.S.C. section 360bbb-3(b)(1), unless the authorization is terminated or revoked sooner. Performed at [facility], [address], [place], [place] [address]          Radiology Studies: No results found.      Scheduled Meds: . apixaban  5 mg Oral BID  . doxycycline  100 mg Oral Q12H  . furosemide  40 mg Oral Daily  . insulin aspart  0-15 Units Subcutaneous TID WC  . insulin aspart  0-5 Units Subcutaneous QHS  . insulin detemir  10 Units Subcutaneous BID   Continuous Infusions:    LOS: 4 days    Time spent: [place], MD [facility]  Password [facility] 01/14/2019, 2:49 PM

## 2019-01-14 NOTE — TOC Initial Note (Signed)
Transition of Care [facility]) - Initial/Assessment Note    Patient Details  Name: [name] MRN: [id number] Date of Birth: 07/29/1951  Transition of [facility] CM/SW Contact:    Bethena Roys, RN Phone Number: 01/14/2019, 2:29 PM  Clinical Narrative:  Pt presented for Symptomatic bradycardia. PTA from home with the support of spouse. Patient was previously active with [facility]. Plan to return home once stable. SOC to begin within 24-48 hours post transition home.                  Expected Discharge Plan: [place] Barriers to Discharge: No Barriers Identified   Patient Goals and CMS Choice Patient states their goals for this hospitalization and ongoing recovery are:: "to get back home"   Choice offered to / list presented to : NA(Pt previously active with [facility])  Expected Discharge Plan and Services Expected Discharge Plan: [place] In-house Referral: NA Discharge Planning Services: CM Consult Post Acute Care Choice: [place] arrangements for the past 2 months: Single Family Home                 DME Arranged: N/A DME Agency: NA       HH Arranged: RN, Disease Management(Wound Care) [place] Agency: NA Date HH Agency Contacted: 01/14/19 Time HH Agency Contacted: 1428 Representative spoke with at [place]: Dan  Prior Living Arrangements/Services Living arrangements for the past 2 months: [place] Lives with:: Spouse Patient language and need for interpreter reviewed:: Yes Do you feel safe going back to the place where you live?: Yes      Need for Family Participation in Patient Care: Yes (Comment) Care giver support system in place?: Yes (comment) Current home services: Home RN Criminal Activity/Legal Involvement Pertinent to Current Situation/Hospitalization: No - Comment as needed  Activities of Daily Living Home Assistive Devices/Equipment: Wheelchair(at home) ADL Screening (condition at time of  admission) Patient's cognitive ability adequate to safely complete daily activities?: Yes Is the patient deaf or have difficulty hearing?: No Does the patient have difficulty seeing, even when wearing glasses/contacts?: No Does the patient have difficulty concentrating, remembering, or making decisions?: No Patient able to express need for assistance with ADLs?: Yes Does the patient have difficulty dressing or bathing?: No Independently performs ADLs?: Yes (appropriate for developmental age) Does the patient have difficulty walking or climbing stairs?: Yes Weakness of Legs: Both Weakness of Arms/Hands: None  Permission Sought/Granted Permission sought to share information with : Family Supports                Emotional Assessment Appearance:: Appears stated age Attitude/Demeanor/Rapport: Engaged Affect (typically observed): Accepting Orientation: : Oriented to Self, Oriented to Situation, Oriented to Place, Oriented to  Time Alcohol / Substance Use: Not Applicable Psych Involvement: No (comment)  Admission diagnosis:  Elevated troponin [R79.89] Symptomatic bradycardia [R00.1] Open abdominal wall wound, initial encounter [S31.109A] Patient Active Problem List   Diagnosis Date Noted  . Elevated troponin   . Nausea and vomiting   . Symptomatic bradycardia 01/10/2019  . DKA (diabetic ketoacidoses) ([place]) 01/10/2019  . Urinary incontinence 12/28/2018  . Recurrent urinary tract infection 12/16/2018  . Wound, open, anterior abdominal wall 12/05/2018  . Hypotension due to hypovolemia   . Gastritis due to nonsteroidal anti-inflammatory drug   . Esophageal dysphagia   . GI bleed 09/03/2018  . Coagulopathy ([place])   . Colitis   . Lower abdominal pain   . Rectal bleeding   .  Dehydration 08/16/2018  . Acute on chronic diastolic CHF (congestive heart failure) ([place]) 08/10/2018  . Acute lower UTI 08/10/2018  . Hypokalemia 08/10/2018  . COPD (chronic obstructive pulmonary disease)  ([place]) 08/10/2018  . Thyroid lesion 08/10/2018  . Closed fracture of right ankle 06/10/2018  . Closed fracture of left tibia and fibula with routine healing 04/13/18 06/10/2018  . Atrial fibrillation ([place]) 04/15/2018  . Chronic diastolic CHF (congestive heart failure) ([place]) 04/15/2018  . CKD (chronic kidney disease), stage III ([place]) 04/15/2018  . Closed right fibular fracture 04/15/2018  . Left tibial fracture 04/14/2018  . CHF exacerbation ([place]) 04/10/2018  . SOB (shortness of breath)   . Acute CHF (congestive heart failure) ([place]) 04/01/2018  . Atrial fibrillation with RVR ([place]) 04/01/2018  . [place] 03/11/2016  . Muscle spasms of lower extremity 03/04/2016  . Acute renal insufficiency 02/26/2016  . History of MRSA infection 02/26/2016  . Diabetes type 2, uncontrolled ([place]) 02/19/2016  . HTN (hypertension) 02/19/2016  . Fracture, femur, distal ([place]) 02/19/2016  . Fall 02/19/2016  . Depression 02/19/2016  . Closed left femoral fracture, initial encounter 02/19/2016  . Femur fracture, left ([place]) 02/19/2016   PCP:  [name], MD Pharmacy:   [facility] [address], [place] - [place]  [id number] [place] [address] Phone: [phone] Fax: [phone]     Social Determinants of Health (SDOH) Interventions    Readmission Risk Interventions Readmission Risk Prevention Plan 01/14/2019  Transportation Screening Complete  Medication Review ([place]) Complete  HRI or [place] Complete  SW Recovery Care/Counseling Consult Complete  Palliative Care Screening Not [place] Not Applicable  Some recent data might be hidden

## 2019-01-15 LAB — CBC
HCT: 28.5 % — ABNORMAL LOW (ref 36.0–46.0)
Hemoglobin: 8.5 g/dL — ABNORMAL LOW (ref 12.0–15.0)
MCH: 25.3 pg — ABNORMAL LOW (ref 26.0–34.0)
MCHC: 29.8 g/dL — ABNORMAL LOW (ref 30.0–36.0)
MCV: 84.8 fL (ref 80.0–100.0)
Platelets: 465 10*3/uL — ABNORMAL HIGH (ref 150–400)
RBC: 3.36 MIL/uL — ABNORMAL LOW (ref 3.87–5.11)
RDW: 16.1 % — ABNORMAL HIGH (ref 11.5–15.5)
WBC: 11.9 10*3/uL — ABNORMAL HIGH (ref 4.0–10.5)
nRBC: 0.8 % — ABNORMAL HIGH (ref 0.0–0.2)

## 2019-01-15 LAB — URINE CULTURE: Culture: >=100000 — AB

## 2019-01-15 LAB — CARBAPENEM RESISTANCE PANEL
Carba Resistance IMP Gene: NOT DETECTED
Carba Resistance KPC Gene: NOT DETECTED
Carba Resistance NDM Gene: NOT DETECTED
Carba Resistance OXA48 Gene: NOT DETECTED
Carba Resistance VIM Gene: NOT DETECTED

## 2019-01-15 LAB — GLUCOSE, CAPILLARY
Glucose-Capillary: 97 mg/dL (ref 70–99)
Glucose-Capillary: 172 mg/dL — ABNORMAL HIGH (ref 70–99)
Glucose-Capillary: 152 mg/dL — ABNORMAL HIGH (ref 70–99)
Glucose-Capillary: 93 mg/dL (ref 70–99)

## 2019-01-15 MED ORDER — FUROSEMIDE 20 MG PO TABS
20.0000 mg | ORAL_TABLET | Freq: Every day | ORAL | Status: DC
  Administered 2019-01-15 – 2019-01-16 (×2): 20 mg via ORAL
  Filled 2019-01-15: qty 1

## 2019-01-15 NOTE — Progress Notes (Signed)
PROGRESS NOTE    [name]  UGQ:[id number] DOB: [birth year] DOA: 01/10/2019 PCP: [name], MD    Brief Narrative:   [name] is a 68 y.o. female with medical history significant of  morbid obesity, chronically ill with severe debility bed/wheelchair bound, chronic atrial fibrillation on Eliquis, diabetes mellitus type 2, essential hypertension, chronic UTI, superficial anterior abdominal wound, depression,  came to the hospital for evaluation of generalized weakness and low heart rate. Patient states for the past 10 days or so she has noticed bilateral lower extremity swelling and elevated blood glucose .   There was also evidence of chronic anterior abdominal wound in the right lower quadrant without evidence of superficial infection but CT of the abdomen pelvis was done to confirm no deeper infection.   Assessment & Plan:   Bradycardia: Resolved, blocker held initially, subsequently heart rate rebounded -Restarted metoprolol at lower dose and monitor  Acute on chronic diastolic CHF -Last echo with normal EF 50 to 55%, mild LVH, diastolic dysfunction noted -Cardiology following, improved with IV Lasix, -Status has improved, diuretics were held temporarily due to bump in creatinine, subsequently transitioned to oral diuretics -Continue Lasix 40 mg daily, diuresing aggressively on this regimen, will drop dose to 81m and monitor -Continue increased activity ambulation, physical therapy  AKI on CKD stage III -Baseline creatinine around 1.6, bumped up to 2 with diuresis, now down to 1.4 -stable  Elevated troponin:  -Possibly from demand ischemia.  -Echo with no new wall wall motion abnormality, no further cardiac work-up indicated at this time  History of recurrent UTIs, versus colonization -On admission she was afebrile with no leukocytosis, she has urinary frequency and incontinence likely from diuretics  -Do not think represents true UTI hence i discontinued  meropenem 3 days ago , remained stable off antibiotics  -In addition CT abdomen pelvis reportedgas in the urinary bladder, pt had a catheter placed in ED that day for urine sample suspect air is related to this -Has also been seen by infectious disease recently for this, history suggestive of fistula -encouraged hygiene -Had a low-grade temp this morning, no localizing symptoms, check CBC and monitor  Type 2 diabetes Mellitus, insulin dependent : -A1c is 7.7 -BG stable, continue sliding scale insulin  Chronic atrial fibrillation:  Resumed eliquis .  -Beta-blocker held initially due to bradycardia,  -restarted beta-blocker at a lower dose, stable, monitor  Depression:  Resume lexapro.   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  Family Communication: none at bedside, discussed the plan with the patient.  [facility]   Consultants:   Cardiology.   Procedures:  Echocardiogram.  CT abdomen and pelvis.    Subjective: -Fever this morning to 100.5, no new  symptoms  Objective: Vitals:   01/15/19 0525 01/15/19 0809 01/15/19 1000 01/15/19 1100  BP: (!) 141/58 (!) 146/66 (!) 134/45 (!)  123/46  Pulse: 71 62 73 70  Resp: 19 13 15 16   Temp: (!) 100.5 F (38.1 C) (!) 97.5 F (36.4 C)  97.9 F (36.6 C)  TempSrc: Oral Oral  Oral  SpO2: 98% 100% 100% 95%  Weight: 106.7 kg     Height:        Intake/Output Summary (Last 24 hours) at 01/15/2019 1220 Last data filed at 01/15/2019 0900 Gross per 24 hour  Intake 480 ml  Output 3200 ml  Net -2720 ml   Filed Weights   01/13/19 0630 01/14/19 0655 01/15/19 0525  Weight: 108.3 kg 107.4 kg 106.7 kg    Examination:  Gen: Morbidly obese, chronically ill female laying in bed alert awake oriented x3 HEENT: PERRLA, Neck supple, no JVD Lungs: Distant breath sounds, decreased at bases CVS: RRR,No Gallops,Rubs or new Murmurs Abd: Soft, obese, nontender, nondistended, right lower abdominal wall with multiple small wounds with areas of purulence and induration, no surrounding cellulitis changes noted Extremities: No edema Skin: no new rashes psychiatry:  Mood & affect appropriate.     Data Reviewed: I have personally reviewed following labs and imaging studies  CBC: Recent Labs  Lab 01/10/19 1646 01/11/19 1106 01/12/19 0939 01/13/19 0750 01/14/19 0644 01/15/19 0808  WBC 10.4 12.8* 10.2 10.2 10.8* 11.9*  NEUTROABS 8.6*  --   --   --   --   --   HGB 7.8* 7.4* 8.0* 7.9* 8.0* 8.5*  HCT 27.4* 25.4* 27.3* 27.0* 27.5* 28.5*  MCV 89.0 83.3 84.8 83.1 84.9 84.8  PLT 346 311 362 379 393 924*   Basic Metabolic Panel: Recent Labs  Lab 01/10/19 1646  01/11/19 1106 01/11/19 2053 01/12/19 0324 01/13/19 0750 01/14/19 0644  NA 134*   < > 135 131* 136 139 138  K 4.8   < > 4.2 4.2 4.3 4.4 4.3  CL 98   < > 101 98 102 101 98  CO2 16*   < > 23 25 25  34* 29  GLUCOSE 331*   < > 165* 115* 92 120* 169*  BUN 28*   < > 28* 30* 31* 30* 25*  CREATININE 1.56*   < > 1.68* 2.00* 2.06* 1.47* 1.18*  CALCIUM 8.8*   < > 8.5* 8.0* 8.3* 8.3* 8.7*  MG 1.6*  --  1.7  --   --   --   --    < > = values in this interval not displayed.   GFR:  Estimated Creatinine Clearance: 55.4 mL/min (A) (by C-G formula based on SCr of 1.18 mg/dL (H)). Liver Function Tests: Recent Labs  Lab 01/10/19 1646  AST 31  ALT 18  ALKPHOS 128*  BILITOT 1.6*  PROT 6.7  ALBUMIN 3.1*   Recent Labs  Lab 01/10/19 1646  LIPASE 17   No results for input(s): AMMONIA in the last 168 hours. Coagulation Profile: No results for input(s): INR, PROTIME in the last 168 hours. Cardiac Enzymes: Recent Labs  Lab 01/10/19 1646 01/10/19 2020  TROPONINI 0.29* 0.25*   BNP (last 3 results) No results for input(s): PROBNP in the last 8760 hours. HbA1C: No results for input(s): HGBA1C in the last 72 hours. CBG: Recent Labs  Lab 01/14/19 1135 01/14/19 1652 01/14/19 2119 01/15/19 0749 01/15/19 1117  GLUCAP 151* 148* 115* 97 172*   Lipid Profile: No results for input(s): CHOL,  HDL, LDLCALC, TRIG, CHOLHDL, LDLDIRECT in the last 72 hours. Thyroid Function Tests: No results for input(s): TSH, T4TOTAL, FREET4, T3FREE, THYROIDAB in the last 72 hours. Anemia Panel: No results for input(s): VITAMINB12, FOLATE, FERRITIN, TIBC, IRON, RETICCTPCT in the last 72 hours. Sepsis Labs: Recent Labs  Lab 01/10/19 1646 01/10/19 1834 01/11/19 1349  LATICACIDVEN 6.2* 2.9* 1.7    Recent Results (from the past 240 hour(s))  Urine culture     Status: Abnormal (Preliminary result)   Collection Time: 01/10/19  3:46 PM  Result Value Ref Range Status   Specimen Description   Final    URINE, RANDOM Performed at [facility], [address] [place], [place] [address]    Special Requests   Final    NONE Performed at [facility], [address], [place], [place] [address]    Culture (A)  Final    >=100,000 COLONIES/mL ESCHERICHIA COLI 90,000 COLONIES/mL KLEBSIELLA PNEUMONIAE BOTH ORGANISMS Confirmed Extended Spectrum Beta-Lactamase Producer (ESBL).  In bloodstream infections from ESBL organisms, carbapenems are preferred over piperacillin/tazobactam. They are shown  to have a lower risk of mortality. CULTURE REINCUBATED FOR BETTER GROWTH Performed at [place] [facility], [place] [address], [place], [place] [address]    Report Status PENDING  Incomplete   Organism ID, Bacteria ESCHERICHIA COLI (A)  Final   Organism ID, Bacteria KLEBSIELLA PNEUMONIAE (A)  Final      Susceptibility   Escherichia coli - MIC*    AMPICILLIN >=32 RESISTANT Resistant     CEFAZOLIN >=64 RESISTANT Resistant     CEFTRIAXONE >=64 RESISTANT Resistant     CIPROFLOXACIN >=4 RESISTANT Resistant     GENTAMICIN <=1 SENSITIVE Sensitive     IMIPENEM <=0.25 SENSITIVE Sensitive     NITROFURANTOIN 64 INTERMEDIATE Intermediate     TRIMETH/SULFA >=320 RESISTANT Resistant     AMPICILLIN/SULBACTAM >=32 RESISTANT Resistant     PIP/TAZO 8 SENSITIVE Sensitive     Extended ESBL POSITIVE Resistant     * >=100,000 COLONIES/mL ESCHERICHIA COLI   Klebsiella pneumoniae - MIC*    AMPICILLIN >=32 RESISTANT Resistant     CEFAZOLIN >=64 RESISTANT Resistant     CEFTRIAXONE >=64 RESISTANT Resistant     CIPROFLOXACIN >=4 RESISTANT Resistant     GENTAMICIN >=16 RESISTANT Resistant     IMIPENEM <=0.25 SENSITIVE Sensitive     NITROFURANTOIN 256 RESISTANT Resistant     TRIMETH/SULFA >=320 RESISTANT Resistant     AMPICILLIN/SULBACTAM >=32 RESISTANT Resistant     PIP/TAZO >=128 RESISTANT Resistant     Extended ESBL POSITIVE Resistant     * 90,000 COLONIES/mL KLEBSIELLA PNEUMONIAE  SARS Coronavirus 2 (CEPHEID - Performed in [place] hospital lab), Hosp Order     Status: None   Collection Time: 01/10/19  3:49 PM  Result Value Ref Range Status   SARS Coronavirus 2 NEGATIVE NEGATIVE Final    Comment: (NOTE) If result is NEGATIVE SARS-CoV-2 target nucleic acids are NOT DETECTED. The SARS-CoV-2 RNA is generally detectable in upper and lower  respiratory specimens during the acute phase of infection. The lowest  concentration of SARS-CoV-2 viral copies this assay can detect is 250  copies / mL. A negative  result does not preclude SARS-CoV-2 infection  and should not be used as the sole basis for treatment or other  patient management decisions.  A negative result may occur with  improper specimen collection / handling, submission of specimen other  than nasopharyngeal swab, presence of viral mutation(s) within the  areas targeted by  this assay, and inadequate number of viral copies  (<250 copies / mL). A negative result must be combined with clinical  observations, patient history, and epidemiological information. If result is POSITIVE SARS-CoV-2 target nucleic acids are DETECTED. The SARS-CoV-2 RNA is generally detectable in upper and lower  respiratory specimens dur ing the acute phase of infection.  Positive  results are indicative of active infection with SARS-CoV-2.  Clinical  correlation with patient history and other diagnostic information is  necessary to determine patient infection status.  Positive results do  not rule out bacterial infection or co-infection with other viruses. If result is PRESUMPTIVE POSTIVE SARS-CoV-2 nucleic acids MAY BE PRESENT.   A presumptive positive result was obtained on the submitted specimen  and confirmed on repeat testing.  While 2019 novel coronavirus  (SARS-CoV-2) nucleic acids may be present in the submitted sample  additional confirmatory testing may be necessary for epidemiological  and / or clinical management purposes  to differentiate between  SARS-CoV-2 and other Sarbecovirus currently known to infect humans.  If clinically indicated additional testing with an alternate test  methodology [phone]) is advised. The SARS-CoV-2 RNA is generally  detectable in upper and lower respiratory sp ecimens during the acute  phase of infection. The expected result is Negative. Fact Sheet for Patients:  StrictlyIdeas.no Fact Sheet for Healthcare Providers: BankingDealers.co.za This test is not yet  approved or cleared by the [place] FDA and has been authorized for detection and/or diagnosis of SARS-CoV-2 by FDA under an Emergency Use Authorization (EUA).  This EUA will remain in effect (meaning this test can be used) for the duration of the COVID-19 declaration under Section 564(b)(1) of the Act, 21 U.S.C. section 360bbb-3(b)(1), unless the authorization is terminated or revoked sooner. Performed at [facility], [address] [place], [place] [address]          Radiology Studies: No results found.      Scheduled Meds: . apixaban  5 mg Oral BID  . doxycycline  100 mg Oral Q12H  . furosemide  20 mg Oral Daily  . insulin aspart  0-15 Units Subcutaneous TID WC  . insulin aspart  0-5 Units Subcutaneous QHS  . insulin detemir  10 Units Subcutaneous BID  . metoprolol tartrate  25 mg Oral BID   Continuous Infusions:    LOS: 5 days    Time spent: [place], MD [facility]  Password [facility] 01/15/2019, 12:20 PM

## 2019-01-16 DIAGNOSIS — L98492 Non-pressure chronic ulcer of skin of other sites with fat layer exposed: Secondary | ICD-10-CM

## 2019-01-16 LAB — CBC
HCT: 27.1 % — ABNORMAL LOW (ref 36.0–46.0)
Hemoglobin: 7.8 g/dL — ABNORMAL LOW (ref 12.0–15.0)
MCH: 25 pg — ABNORMAL LOW (ref 26.0–34.0)
MCHC: 28.8 g/dL — ABNORMAL LOW (ref 30.0–36.0)
MCV: 86.9 fL (ref 80.0–100.0)
Platelets: 419 10*3/uL — ABNORMAL HIGH (ref 150–400)
RBC: 3.12 MIL/uL — ABNORMAL LOW (ref 3.87–5.11)
RDW: 16.1 % — ABNORMAL HIGH (ref 11.5–15.5)
WBC: 10.4 10*3/uL (ref 4.0–10.5)
nRBC: 0.2 % (ref 0.0–0.2)

## 2019-01-16 LAB — BASIC METABOLIC PANEL
Anion gap: 7 (ref 5–15)
BUN: 22 mg/dL (ref 8–23)
CO2: 34 mmol/L — ABNORMAL HIGH (ref 22–32)
Calcium: 8.5 mg/dL — ABNORMAL LOW (ref 8.9–10.3)
Chloride: 99 mmol/L (ref 98–111)
Creatinine, Ser: 0.91 mg/dL (ref 0.44–1.00)
GFR calc Af Amer: >60 mL/min (ref >60)
GFR calc non Af Amer: >60 mL/min (ref >60)
Glucose, Bld: 105 mg/dL — ABNORMAL HIGH (ref 70–99)
Potassium: 4.3 mmol/L (ref 3.5–5.1)
Sodium: 140 mmol/L (ref 135–145)

## 2019-01-16 LAB — GLUCOSE, CAPILLARY
Glucose-Capillary: 77 mg/dL (ref 70–99)
Glucose-Capillary: 136 mg/dL — ABNORMAL HIGH (ref 70–99)

## 2019-01-16 MED ORDER — SENNOSIDES-DOCUSATE SODIUM 8.6-50 MG PO TABS: 1.0000 | ORAL_TABLET | Freq: Every evening | ORAL | 0 refills | Status: DC | PRN

## 2019-01-16 MED ORDER — FUROSEMIDE 20 MG PO TABS: 40.0000 mg | ORAL_TABLET | Freq: Every day | ORAL | Status: DC

## 2019-01-16 MED ORDER — FERROUS SULFATE 325 (65 FE) MG PO TABS: 325.0000 mg | ORAL_TABLET | Freq: Two times a day (BID) | ORAL | 0 refills | Status: DC

## 2019-01-16 MED ORDER — DOXYCYCLINE HYCLATE 100 MG PO TABS: 100.0000 mg | ORAL_TABLET | Freq: Two times a day (BID) | ORAL | 0 refills | Status: AC

## 2019-01-16 NOTE — Discharge Summary (Signed)
Physician Discharge Summary  [name] QQP:[id number] DOB: [birth year] DOA: 01/10/2019  PCP: [name], MD  Admit date: 01/10/2019 Discharge date: 01/16/2019  Time spent: 94mnutes  Recommendations for Outpatient Follow-up:  PCP in 1 week Home health PT, RN-wound care Wound care center in 1 week Infectious disease Dr. CMegan Salonin 1 month  Discharge Diagnoses:  Acute on chronic diastolic CHF Chronic right lower abdominal wall wounds Morbid obesity Type 2 diabetes mellitus Stage III chronic kidney disease   Diabetes type 2, uncontrolled ([place]   HTN (hypertension)   Depression   CHF exacerbation (HCC)   Chronic diastolic CHF (congestive heart failure) (HCC)   CKD (chronic kidney disease), stage III (HCC)   COPD (chronic obstructive pulmonary disease) (HCC)   Wound, open, anterior abdominal wall   Elevated troponin   Nausea and vomiting   Discharge Condition: Stable  Diet recommendation: Low-sodium, diabetic, heart healthy  Filed Weights   01/14/19 0655 01/15/19 0525 01/16/19 0516  Weight: 107.4 kg 106.7 kg 106.4 kg    History of present illness:  Katrina M Kingis a 68y.o.femalewith medical history significant of morbid obesity, chronically ill with severe debility bed/wheelchair bound, chronic atrial fibrillation on Eliquis, diabetes mellitus type 2, essential hypertension, chronic UTI, superficial anterior abdominal wound, depression,  came to the hospital for evaluation of generalized weakness and low heart rate. Patient states for the past 10 days or so she has noticed bilateral lower extremity swelling and elevated blood glucose .   Hospital Course:   Acute on chronic diastolic CHF -Last echo with normal EF 50 to 55%, mild LVH, diastolic dysfunction noted -Cardiology consulted, improved with IV Lasix, -Volume status improved, diuretics were held temporarily due to bump in creatinine, subsequently transitioned to oral diuretics -Continue Lasix  40 mg daily instead of 214mdaily at home -Continue increased activity ambulation, physical therapy evaluation completed, she will go home with home health PT OT  Bradycardia: Resolved, blocker held initially, subsequently heart rate rebounded -Restarted metoprolol, heart rate stable now   Nausea -unclear etiology, resolved  AKI on CKD stage III -Baseline creatinine around 1.6, bumped up to 2 with diuresis, now down to 1.4 -stable  Elevated troponin:  -Possibly from demand ischemia.  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 -Has multiple small ulcers with purulent base, has been dealing with this off and on for the past couple of months at one point it had completely resolved and then recurred  -Started her on oral doxycycline 3days ago due to purulence and slight increased drainage, given history of MRSA  -Was seen by wound nurse who recommended Aquacel  -Also had general surgery curbside assess her wound who recommended current wound care regimen and follow-up at the wound center -Discharged home on doxycycline for 4 more days -Home health nurse to follow-up and assist with wound reassessment and dressing changes  History of recurrent UTIs, versus colonization -On admission she was afebrile with no leukocytosis, she has urinary frequency and incontinence likely from diuretics, clinically did not think she had a UTI hence discontinued meropenem when I saw her 4 days ago,  -Urine culture grew ESBL E. coli and Klebsiella which are likely colonizers, no antibiotics indicated for this, remains stable off antibiotics -In addition CT abdomen pelvis reported gas in the urinary bladder, pt had a catheter placed in ED the day of admission for urine sample suspect air  is related to this -Has also been seen by infectious disease recently for this, history suggestive of  fistula -encouraged good personal hygiene  Type 2 diabetes Mellitus, insulin dependent : -A1c is 7.7 -BG stable, continue sliding scale insulin  Chronic atrial fibrillation:  Resumed eliquis .  -Beta-blocker held initially due to bradycardia,  -restarted beta-blocker at a lower dose, stable, monitor  Depression:  Resume lexapro.   Anemia due to chronic disease and iron deficiency :  -Anemia panel suggested severe iron deficiency -History of GI bleed earlier this year, given IV iron -Due to follow-up with hematology at the cancer center for iron deficiency anemia -hb stable, continued on iron at discharge   Discharge Exam: Vitals:   01/16/19 0516 01/16/19 0749  BP: (!) 135/53 (!) 134/51  Pulse: 64 67  Resp:    Temp: 98.5 F (36.9 C) 97.8 F (36.6 C)  SpO2: 99% 100%    General: AAOx3 Cardiovascular: S1S2/RRR Respiratory: CTAB  Discharge Instructions   Discharge Instructions    AMB Referral to [facility]   Complete by:  As directed    [name] is a 68 y.o. female with a history of chronic diastolic CHF, atrial fibrillation on Eliquis, hypertension, type 2 diabetes mellitus, chronic UTI, chronic non-healing abdominal wound, and morbid obesity who is being seen for generalized weakness, low heart rate and evaluation of bradycardia and elevated troponin.  (Acute on Chronic Diastolic CHF, Paroxysmal Atrial Fibrillation) Patient has noticed bilateral lower extremity swelling and elevated blood glucose, also with chronic anterior abdominal wound in the right lower quadrant. Patient confirmed that her primary care provider is Dr. [name] with [facility] at [facility], listed as providing transition of care follow-up.  She reports living with husband (primary caregiver) and her 89 year old granddaughter whom they are taking care of.  Patient had indicated having no current needs or concerns with medications, pharmacy and transportation  (husband). However, she had voiced interest with [facility] care management services to further assist her in managing her health conditions (HF, DM type 2). She reports regular monitoring of her weight and blood sugar but have not been recording results to bring to provider's visit. Patient also mentioned need to follow diet restrictions since she "has access to all kinds of food at home". Patient verbally agreed  to be:  1) referred to [facility] care management services- community- for complex disease management after discharge.  2) Please send consent form to obtain patient's written consent for [facility] services.   Reason for consult:  Referral to [facility] community care coordinator for complex care, disease management (HF, DM II) and assess further needs post discharge.   Diagnoses of:   Heart Failure Diabetes     Expected date of contact:  1-3 days (reserved for hospital discharges)   Diet - low sodium heart healthy   Complete by:  As directed    Diet Carb Modified   Complete by:  As directed    Increase activity slowly   Complete by:  As directed      Allergies as of 01/16/2019      Reactions   Propranolol Swelling   Pt states it may have been leg swelling   Topamax [topiramate] Other (See Comments)   hallucinations   Latex Itching, Rash      Medication List    TAKE these medications   acetaminophen 500 MG tablet Commonly known as:  TYLENOL Take 500-1,000 mg by mouth every 8 (eight) hours as needed  for mild pain or headache.   albuterol 108 (90 Base) MCG/ACT inhaler Commonly known as:  VENTOLIN HFA Inhale 2 puffs into the lungs every 6 (six) hours as needed for wheezing or shortness of breath. What changed:  reasons to take this   apixaban 5 MG Tabs tablet Commonly known as:  ELIQUIS Take 1 tablet (5 mg total) by mouth 2 (two) times daily.   diltiazem 120 MG 24 hr capsule Commonly known as:  CARDIZEM CD Take 1 capsule (120 mg total) by mouth daily. What changed:  additional  instructions   doxycycline 100 MG tablet Commonly known as:  VIBRA-TABS Take 1 tablet (100 mg total) by mouth every 12 (twelve) hours for 4 days.   escitalopram 10 MG tablet Commonly known as:  LEXAPRO Take 10 mg by mouth daily.   ferrous sulfate 325 (65 FE) MG tablet Commonly known as:  FerrouSul Take 1 tablet (325 mg total) by mouth 2 (two) times daily with a meal.   fluticasone 50 MCG/ACT nasal spray Commonly known as:  FLONASE Place 2 sprays into both nostrils daily as needed for allergies or rhinitis (congestion).   furosemide 20 MG tablet Commonly known as:  LASIX Take 2 tablets (40 mg total) by mouth daily. What changed:  how much to take   glipiZIDE 10 MG tablet Commonly known as:  GLUCOTROL Take 10 mg by mouth 2 (two) times daily.   Levemir FlexTouch 100 UNIT/ML Pen Generic drug:  Insulin Detemir Inject 10 Units into the skin 2 (two) times a day.   metoprolol tartrate 25 MG tablet Commonly known as:  LOPRESSOR Take 1 tablet (25 mg total) by mouth 2 (two) times daily. What changed:    how much to take  when to take this   nystatin powder Commonly known as:  MYCOSTATIN/NYSTOP Apply topically daily as needed (rash in skin folds).   OVER THE COUNTER MEDICATION Apply 1 application topically daily as needed (rash in skin folds). Baby butt paste   pantoprazole 40 MG tablet Commonly known as:  PROTONIX Take 1 tablet (40 mg total) by mouth daily before breakfast. What changed:    when to take this  reasons to take this   pioglitazone 45 MG tablet Commonly known as:  ACTOS Take 45 mg by mouth daily.   senna-docusate 8.6-50 MG tablet Commonly known as:  Senokot-S Take 1 tablet by mouth at bedtime as needed for mild constipation.      Allergies  Allergen Reactions  . Propranolol Swelling    Pt states it may have been leg swelling  . Topamax [Topiramate] Other (See Comments)    hallucinations  . Latex Itching and Rash   Follow-up Information     [facility] Follow up.   Why:  Registered Nurse Contact information: [address] [place] [phone]       [name], MD. Schedule an appointment as soon as possible for a visit in 1 week(s).   Specialty:  Internal Medicine Contact information: [address] [place] [address] [phone]        Herminio Commons, MD. Schedule an appointment as soon as possible for a visit in 1 month(s).   Specialty:  Cardiology Contact information: [place] [address] [phone]            The results of significant diagnostics from this hospitalization (including imaging, microbiology, ancillary and laboratory) are listed below for reference.  Significant Diagnostic Studies: Ct Abdomen Pelvis Wo Contrast  Result Date: 01/10/2019 CLINICAL DATA:  3 day history of weakness with superficial wounds in the left lower quadrant abdominal wall. EXAM: CT ABDOMEN AND PELVIS WITHOUT CONTRAST TECHNIQUE: Multidetector CT imaging of the abdomen and pelvis was performed following the standard protocol without IV contrast. COMPARISON:  12/05/2018 FINDINGS: Lower chest: Tiny left and small right pleural effusions with dependent atelectasis in the lower lobes bilaterally. Hepatobiliary: No focal abnormality in the liver on this study without intravenous contrast. The liver shows diffusely decreased attenuation suggesting steatosis. Gallbladder is surgically absent. Extrahepatic common duct is dilated up to 17 mm diameter which is stable to 17 mm on the prior study. Pancreas: Diffusely atrophic without main duct dilatation. Spleen: No splenomegaly. No focal mass lesion. Adrenals/Urinary Tract: No adrenal nodule or mass. Kidneys are atrophic bilaterally without hydronephrosis. No evidence for hydroureter. Prominent gas volume noted in the urinary bladder. Stomach/Bowel: Stomach is unremarkable. No gastric wall thickening.  No evidence of outlet obstruction. Duodenum is normally positioned as is the ligament of Treitz. No small bowel wall thickening. No small bowel dilatation. The terminal ileum is normal. The appendix is not visualized, but there is no edema or inflammation in the region of the cecum. No gross colonic mass. No colonic wall thickening. Diverticular changes are noted in the left colon without evidence of diverticulitis. Vascular/Lymphatic: There is abdominal aortic atherosclerosis without aneurysm. There is no gastrohepatic or hepatoduodenal ligament lymphadenopathy. No intraperitoneal or retroperitoneal lymphadenopathy. No pelvic sidewall lymphadenopathy. Reproductive: The uterus is surgically absent. No left adnexal mass. 4.3 cm benign appearing cystic lesion in the right adnexal space is stable in the interval. Other: No intraperitoneal free fluid. There is some edema in the porta hepatis and along the inferior right liver. Musculoskeletal: Diffuse body wall edema noted. Skin thickening noted lower anterior abdominal wall with superficial gas in the subcutaneous fat identified in the right lower quadrant of the anterior abdominal wall. Bones are diffusely demineralized. No worrisome lytic or sclerotic osseous abnormality. IMPRESSION: 1. Minimal edema identified in the hepato duodenal ligament/porta hepatis. Etiology not evident on this study, but duodenitis and pancreatitis not excluded. 2. Large gas volume in the urinary bladder. In the absence of recent bladder instrumentation, infection should be considered. 3. Stable dilation of the extrahepatic common duct in this patient status post cholecystectomy. 4. Skin thickening lower anterior abdominal wall, right greater than left with subcutaneous superficial gas bubbles in the subcutaneous fat of the anterior right lower abdominal wall compatible with the reported wound. 5. Stable appearance of benign appearing right ovarian cyst measuring 4.3 cm is stable back to  09/03/2018. While interval stability is reassuring, patient age and cysts size indicate follow-up pelvic ultrasound to further evaluate. This recommendation follows ACR consensus guidelines: White Paper of the ACR Incidental Findings Committee II on Adnexal Findings. J Am Coll Radiol 2013:10:675-681. 6. Small right and tiny left pleural effusion with some dependent atelectasis in the lower lobes bilaterally. 7.  Aortic Atherosclerois (ICD10-170.0) Electronically Signed   By: Misty Stanley M.D.   On: 01/10/2019 19:33   Dg Chest Port 1 View  Result Date: 01/10/2019 CLINICAL DATA:  Weakness EXAM: PORTABLE CHEST 1 VIEW COMPARISON:  08/16/2018 FINDINGS: Low volume AP portable examination. Cardiomegaly with mild pulmonary vascular prominence but without overt edema. No focal airspace opacity. IMPRESSION: Low volume AP portable examination. Cardiomegaly with mild pulmonary vascular prominence but without overt edema. No focal airspace opacity. Electronically Signed   By: Cristie Hem  Laqueta Carina M.D.   On: 01/10/2019 17:22   Dg Abd 2 Views  Result Date: 01/11/2019 CLINICAL DATA:  Nausea, vomiting and right lower quadrant pain for 1 day. EXAM: ABDOMEN - 2 VIEW COMPARISON:  CT abdomen and pelvis 01/10/2019. FINDINGS: The bowel gas pattern is normal. There is no evidence of free air. No radio-opaque calculi or other significant radiographic abnormality is seen. Surgical clips in the right upper quadrant of the abdomen and intramedullary nail in the visualized left femur noted. IMPRESSION: No acute finding. Electronically Signed   By: Inge Rise M.D.   On: 01/11/2019 14:52    Microbiology: Recent Results (from the past 240 hour(s))  Urine culture     Status: Abnormal   Collection Time: 01/10/19  3:46 PM  Result Value Ref Range Status   Specimen Description   Final    URINE, RANDOM Performed at [facility], [address] [place], [place] [address]    Special Requests   Final    NONE Performed at [facility], [address], [place], [place] [address]    Culture (A)  Final    >=100,000 COLONIES/mL ESCHERICHIA COLI 90,000 COLONIES/mL KLEBSIELLA PNEUMONIAE BOTH ORGANISMS Confirmed Extended Spectrum Beta-Lactamase Producer (ESBL).  In bloodstream infections from ESBL organisms, carbapenems are preferred over piperacillin/tazobactam. They are shown to have a lower risk of mortality.    Report Status 01/15/2019 FINAL  Final   Organism ID, Bacteria ESCHERICHIA COLI (A)  Final   Organism ID, Bacteria KLEBSIELLA PNEUMONIAE (A)  Final      Susceptibility   Escherichia coli - MIC*    AMPICILLIN >=32 RESISTANT Resistant     CEFAZOLIN >=64 RESISTANT Resistant     CEFTRIAXONE >=64 RESISTANT Resistant     CIPROFLOXACIN >=4 RESISTANT Resistant     GENTAMICIN <=1 SENSITIVE Sensitive     IMIPENEM <=0.25 SENSITIVE Sensitive     NITROFURANTOIN 64 INTERMEDIATE Intermediate     TRIMETH/SULFA >=320 RESISTANT Resistant     AMPICILLIN/SULBACTAM >=32 RESISTANT Resistant     PIP/TAZO 8 SENSITIVE Sensitive     Extended ESBL POSITIVE Resistant     * >=100,000 COLONIES/mL ESCHERICHIA COLI   Klebsiella pneumoniae - MIC*    AMPICILLIN >=32 RESISTANT Resistant     CEFAZOLIN >=64 RESISTANT Resistant     CEFTRIAXONE >=64 RESISTANT Resistant     CIPROFLOXACIN >=4 RESISTANT Resistant     GENTAMICIN >=16 RESISTANT Resistant     IMIPENEM <=0.25 SENSITIVE Sensitive     NITROFURANTOIN 256 RESISTANT Resistant     TRIMETH/SULFA >=320 RESISTANT Resistant     AMPICILLIN/SULBACTAM >=32 RESISTANT Resistant     PIP/TAZO >=128 RESISTANT Resistant     Extended ESBL POSITIVE Resistant     * 90,000 COLONIES/mL KLEBSIELLA PNEUMONIAE  Carbapenem Resistance Panel     Status: None   Collection Time: 01/10/19  3:46 PM  Result Value Ref Range Status   Carba Resistance IMP Gene NOT DETECTED NOT DETECTED Final   Carba Resistance VIM Gene NOT DETECTED NOT DETECTED Final   Carba Resistance NDM Gene NOT DETECTED NOT DETECTED Final    Carba Resistance KPC Gene NOT DETECTED NOT DETECTED Final   Carba Resistance OXA48 Gene NOT DETECTED NOT DETECTED Final    Comment: (NOTE) Cepheid Carba-R is an FDA-cleared nucleic acid amplification test  (NAAT)for the detection and differentiation of genes encoding the  most prevalent carbapenemases in bacterial isolate samples. Carbapenemase gene identification and implementation of comprehensive  infection control measures are recommended by the CDC to  prevent the  spread of the resistant organisms. Performed at [facility], [place] [address], [place], [place] [address]   SARS Coronavirus 2 (CEPHEID - Performed in [facility] hospital lab), Hosp Order     Status: None   Collection Time: 01/10/19  3:49 PM  Result Value Ref Range Status   SARS Coronavirus 2 NEGATIVE NEGATIVE Final    Comment: (NOTE) If result is NEGATIVE SARS-CoV-2 target nucleic acids are NOT DETECTED. The SARS-CoV-2 RNA is generally detectable in upper and lower  respiratory specimens during the acute phase of infection. The lowest  concentration of SARS-CoV-2 viral copies this assay can detect is 250  copies / mL. A negative result does not preclude SARS-CoV-2 infection  and should not be used as the sole basis for treatment or other  patient management decisions.  A negative result may occur with  improper specimen collection / handling, submission of specimen other  than nasopharyngeal swab, presence of viral mutation(s) within the  areas targeted by this assay, and inadequate number of viral copies  (<250 copies / mL). A negative result must be combined with clinical  observations, patient history, and epidemiological information. If result is POSITIVE SARS-CoV-2 target nucleic acids are DETECTED. The SARS-CoV-2 RNA is generally detectable in upper and lower  respiratory specimens dur ing the acute phase of infection.  Positive  results are indicative of active infection with SARS-CoV-2.  Clinical   correlation with patient history and other diagnostic information is  necessary to determine patient infection status.  Positive results do  not rule out bacterial infection or co-infection with other viruses. If result is PRESUMPTIVE POSTIVE SARS-CoV-2 nucleic acids MAY BE PRESENT.   A presumptive positive result was obtained on the submitted specimen  and confirmed on repeat testing.  While 2019 novel coronavirus  (SARS-CoV-2) nucleic acids may be present in the submitted sample  additional confirmatory testing may be necessary for epidemiological  and / or clinical management purposes  to differentiate between  SARS-CoV-2 and other Sarbecovirus currently known to infect humans.  If clinically indicated additional testing with an alternate test  methodology [id number]) is advised. The SARS-CoV-2 RNA is generally  detectable in upper and lower respiratory sp ecimens during the acute  phase of infection. The expected result is Negative. Fact Sheet for Patients:  StrictlyIdeas.no Fact Sheet for Healthcare Providers: BankingDealers.co.za This test is not yet approved or cleared by the [place] FDA and has been authorized for detection and/or diagnosis of SARS-CoV-2 by FDA under an Emergency Use Authorization (EUA).  This EUA will remain in effect (meaning this test can be used) for the duration of the COVID-19 declaration under Section 564(b)(1) of the Act, 21 U.S.C. section 360bbb-3(b)(1), unless the authorization is terminated or revoked sooner. Performed at [facility], [address] [place], [place] [address]      Labs: Basic Metabolic Panel: Recent Labs  Lab 01/10/19 1646  01/11/19 1106 01/11/19 2053 01/12/19 0324 01/13/19 0750 01/14/19 0644 01/16/19 0547  NA 134*   < > 135 131* 136 139 138 140  K 4.8   < > 4.2 4.2 4.3 4.4 4.3 4.3  CL 98   < > 101 98 102 101 98 99  CO2 16*   < > 23 25 25  34* 29 34*  GLUCOSE 331*   <  > 165* 115* 92 120* 169* 105*  BUN 28*   < > 28* 30* 31* 30* 25* 22  CREATININE 1.56*   < >  1.68* 2.00* 2.06* 1.47* 1.18* 0.91  CALCIUM 8.8*   < > 8.5* 8.0* 8.3* 8.3* 8.7* 8.5*  MG 1.6*  --  1.7  --   --   --   --   --    < > = values in this interval not displayed.   Liver Function Tests: Recent Labs  Lab 01/10/19 1646  AST 31  ALT 18  ALKPHOS 128*  BILITOT 1.6*  PROT 6.7  ALBUMIN 3.1*   Recent Labs  Lab 01/10/19 1646  LIPASE 17   No results for input(s): AMMONIA in the last 168 hours. CBC: Recent Labs  Lab 01/10/19 1646  01/12/19 0939 01/13/19 0750 01/14/19 0644 01/15/19 0808 01/16/19 0547  WBC 10.4   < > 10.2 10.2 10.8* 11.9* 10.4  NEUTROABS 8.6*  --   --   --   --   --   --   HGB 7.8*   < > 8.0* 7.9* 8.0* 8.5* 7.8*  HCT 27.4*   < > 27.3* 27.0* 27.5* 28.5* 27.1*  MCV 89.0   < > 84.8 83.1 84.9 84.8 86.9  PLT 346   < > 362 379 393 465* 419*   < > = values in this interval not displayed.   Cardiac Enzymes: Recent Labs  Lab 01/10/19 1646 01/10/19 2020  TROPONINI 0.29* 0.25*   BNP: BNP (last 3 results) Recent Labs    08/10/18 0746 08/16/18 1848 01/10/19 1646  BNP 247.0* 464.0* 2,107.0*    ProBNP (last 3 results) No results for input(s): PROBNP in the last 8760 hours.  CBG: Recent Labs  Lab 01/15/19 0749 01/15/19 1117 01/15/19 1633 01/15/19 2115 01/16/19 0745  GLUCAP 97 172* 152* 93 77       Signed:  Domenic Polite MD.  [facility] 01/16/2019, 12:03 PM

## 2019-01-17 ENCOUNTER — Telehealth: Payer: Self-pay

## 2019-01-17 DIAGNOSIS — S31109D Unspecified open wound of abdominal wall, unspecified quadrant without penetration into peritoneal cavity, subsequent encounter: Secondary | ICD-10-CM | POA: Diagnosis not present

## 2019-01-17 DIAGNOSIS — I130 Hypertensive heart and chronic kidney disease with heart failure and stage 1 through stage 4 chronic kidney disease, or unspecified chronic kidney disease: Secondary | ICD-10-CM | POA: Diagnosis not present

## 2019-01-17 DIAGNOSIS — N183 Chronic kidney disease, stage 3 (moderate): Secondary | ICD-10-CM | POA: Diagnosis not present

## 2019-01-17 DIAGNOSIS — E1165 Type 2 diabetes mellitus with hyperglycemia: Secondary | ICD-10-CM | POA: Diagnosis not present

## 2019-01-17 DIAGNOSIS — F329 Major depressive disorder, single episode, unspecified: Secondary | ICD-10-CM | POA: Diagnosis not present

## 2019-01-17 DIAGNOSIS — Z87440 Personal history of urinary (tract) infections: Secondary | ICD-10-CM | POA: Diagnosis not present

## 2019-01-17 DIAGNOSIS — Z6841 Body Mass Index (BMI) 40.0 and over, adult: Secondary | ICD-10-CM | POA: Diagnosis not present

## 2019-01-17 DIAGNOSIS — I5032 Chronic diastolic (congestive) heart failure: Secondary | ICD-10-CM | POA: Diagnosis not present

## 2019-01-17 DIAGNOSIS — E1122 Type 2 diabetes mellitus with diabetic chronic kidney disease: Secondary | ICD-10-CM | POA: Diagnosis not present

## 2019-01-17 DIAGNOSIS — J449 Chronic obstructive pulmonary disease, unspecified: Secondary | ICD-10-CM | POA: Diagnosis not present

## 2019-01-17 DIAGNOSIS — I482 Chronic atrial fibrillation, unspecified: Secondary | ICD-10-CM | POA: Diagnosis not present

## 2019-01-17 DIAGNOSIS — L03311 Cellulitis of abdominal wall: Secondary | ICD-10-CM | POA: Diagnosis not present

## 2019-01-17 NOTE — Telephone Encounter (Signed)
Left message to call our office per Dr Megan Salon to see if she is still symptomatic.    Laverle Patter, RN

## 2019-01-20 DIAGNOSIS — J449 Chronic obstructive pulmonary disease, unspecified: Secondary | ICD-10-CM | POA: Diagnosis not present

## 2019-01-20 DIAGNOSIS — S31109D Unspecified open wound of abdominal wall, unspecified quadrant without penetration into peritoneal cavity, subsequent encounter: Secondary | ICD-10-CM | POA: Diagnosis not present

## 2019-01-20 DIAGNOSIS — E1122 Type 2 diabetes mellitus with diabetic chronic kidney disease: Secondary | ICD-10-CM | POA: Diagnosis not present

## 2019-01-20 DIAGNOSIS — E1165 Type 2 diabetes mellitus with hyperglycemia: Secondary | ICD-10-CM | POA: Diagnosis not present

## 2019-01-20 DIAGNOSIS — I5032 Chronic diastolic (congestive) heart failure: Secondary | ICD-10-CM | POA: Diagnosis not present

## 2019-01-20 DIAGNOSIS — F329 Major depressive disorder, single episode, unspecified: Secondary | ICD-10-CM | POA: Diagnosis not present

## 2019-01-20 DIAGNOSIS — I130 Hypertensive heart and chronic kidney disease with heart failure and stage 1 through stage 4 chronic kidney disease, or unspecified chronic kidney disease: Secondary | ICD-10-CM | POA: Diagnosis not present

## 2019-01-20 DIAGNOSIS — Z87440 Personal history of urinary (tract) infections: Secondary | ICD-10-CM | POA: Diagnosis not present

## 2019-01-20 DIAGNOSIS — I482 Chronic atrial fibrillation, unspecified: Secondary | ICD-10-CM | POA: Diagnosis not present

## 2019-01-20 DIAGNOSIS — Z6841 Body Mass Index (BMI) 40.0 and over, adult: Secondary | ICD-10-CM | POA: Diagnosis not present

## 2019-01-20 DIAGNOSIS — N183 Chronic kidney disease, stage 3 (moderate): Secondary | ICD-10-CM | POA: Diagnosis not present

## 2019-01-20 DIAGNOSIS — L03311 Cellulitis of abdominal wall: Secondary | ICD-10-CM | POA: Diagnosis not present

## 2019-01-21 ENCOUNTER — Other Ambulatory Visit: Payer: Self-pay

## 2019-01-21 DIAGNOSIS — J449 Chronic obstructive pulmonary disease, unspecified: Secondary | ICD-10-CM | POA: Diagnosis not present

## 2019-01-21 DIAGNOSIS — S7292XA Unspecified fracture of left femur, initial encounter for closed fracture: Secondary | ICD-10-CM | POA: Diagnosis not present

## 2019-01-21 DIAGNOSIS — Z9181 History of falling: Secondary | ICD-10-CM | POA: Diagnosis not present

## 2019-01-21 NOTE — Patient Outreach (Signed)
[facility]) Care Management  01/21/2019  Jamar M Aung [birth year] [id number]   Referral received from [facility] for complex disease management. Successful outreach with Mrs. Pinkhasov. HIPAA identifiers verified. Discussed [facility] services and reason for referral. She is agreeable to follow-up outreach. Agreeable to completing telephonic assessment next week.  Contact information provided. Encouraged to contact RNCM if needed prior to scheduled outreach.  PLAN -Will follow up on 01/26/19.   [place] [phone]

## 2019-01-24 ENCOUNTER — Telehealth: Payer: Self-pay | Admitting: *Deleted

## 2019-01-24 DIAGNOSIS — E1122 Type 2 diabetes mellitus with diabetic chronic kidney disease: Secondary | ICD-10-CM | POA: Diagnosis not present

## 2019-01-24 DIAGNOSIS — E1165 Type 2 diabetes mellitus with hyperglycemia: Secondary | ICD-10-CM | POA: Diagnosis not present

## 2019-01-24 DIAGNOSIS — I5032 Chronic diastolic (congestive) heart failure: Secondary | ICD-10-CM | POA: Diagnosis not present

## 2019-01-24 DIAGNOSIS — I482 Chronic atrial fibrillation, unspecified: Secondary | ICD-10-CM | POA: Diagnosis not present

## 2019-01-24 DIAGNOSIS — I130 Hypertensive heart and chronic kidney disease with heart failure and stage 1 through stage 4 chronic kidney disease, or unspecified chronic kidney disease: Secondary | ICD-10-CM | POA: Diagnosis not present

## 2019-01-24 DIAGNOSIS — J449 Chronic obstructive pulmonary disease, unspecified: Secondary | ICD-10-CM | POA: Diagnosis not present

## 2019-01-24 DIAGNOSIS — F329 Major depressive disorder, single episode, unspecified: Secondary | ICD-10-CM | POA: Diagnosis not present

## 2019-01-24 DIAGNOSIS — N183 Chronic kidney disease, stage 3 (moderate): Secondary | ICD-10-CM | POA: Diagnosis not present

## 2019-01-24 DIAGNOSIS — S31109D Unspecified open wound of abdominal wall, unspecified quadrant without penetration into peritoneal cavity, subsequent encounter: Secondary | ICD-10-CM | POA: Diagnosis not present

## 2019-01-24 DIAGNOSIS — L03311 Cellulitis of abdominal wall: Secondary | ICD-10-CM | POA: Diagnosis not present

## 2019-01-24 DIAGNOSIS — Z87440 Personal history of urinary (tract) infections: Secondary | ICD-10-CM | POA: Diagnosis not present

## 2019-01-24 DIAGNOSIS — Z6841 Body Mass Index (BMI) 40.0 and over, adult: Secondary | ICD-10-CM | POA: Diagnosis not present

## 2019-01-24 NOTE — Telephone Encounter (Signed)
Called to confirm appt for 6/9 at 1pm with Dr. Irene Limbo.

## 2019-01-25 ENCOUNTER — Telehealth: Payer: Self-pay | Admitting: Hematology

## 2019-01-25 ENCOUNTER — Other Ambulatory Visit: Payer: Self-pay

## 2019-01-25 ENCOUNTER — Inpatient Hospital Stay: Payer: BC Managed Care – PPO

## 2019-01-25 ENCOUNTER — Inpatient Hospital Stay: Payer: BC Managed Care – PPO | Attending: Hematology | Admitting: Hematology

## 2019-01-25 VITALS — BP 170/64 | HR 65 | Temp 98.3°F | Resp 18 | Ht 65.0 in | Wt 233.9 lb

## 2019-01-25 DIAGNOSIS — D649 Anemia, unspecified: Secondary | ICD-10-CM | POA: Insufficient documentation

## 2019-01-25 DIAGNOSIS — E119 Type 2 diabetes mellitus without complications: Secondary | ICD-10-CM | POA: Insufficient documentation

## 2019-01-25 DIAGNOSIS — I700 Atherosclerosis of aorta: Secondary | ICD-10-CM | POA: Insufficient documentation

## 2019-01-25 DIAGNOSIS — I4891 Unspecified atrial fibrillation: Secondary | ICD-10-CM | POA: Insufficient documentation

## 2019-01-25 DIAGNOSIS — I110 Hypertensive heart disease with heart failure: Secondary | ICD-10-CM | POA: Insufficient documentation

## 2019-01-25 DIAGNOSIS — I10 Essential (primary) hypertension: Secondary | ICD-10-CM | POA: Diagnosis not present

## 2019-01-25 DIAGNOSIS — Z79899 Other long term (current) drug therapy: Secondary | ICD-10-CM | POA: Diagnosis not present

## 2019-01-25 LAB — CMP (CANCER CENTER ONLY)
ALT: <6 U/L (ref 0–44)
AST: 7 U/L — ABNORMAL LOW (ref 15–41)
Albumin: 3 g/dL — ABNORMAL LOW (ref 3.5–5.0)
Alkaline Phosphatase: 91 U/L (ref 38–126)
Anion gap: 10 (ref 5–15)
BUN: 13 mg/dL (ref 8–23)
CO2: 27 mmol/L (ref 22–32)
Calcium: 8.8 mg/dL — ABNORMAL LOW (ref 8.9–10.3)
Chloride: 103 mmol/L (ref 98–111)
Creatinine: 0.81 mg/dL (ref 0.44–1.00)
GFR, Est AFR Am: >60 mL/min (ref >60)
GFR, Estimated: >60 mL/min (ref >60)
Glucose, Bld: 147 mg/dL — ABNORMAL HIGH (ref 70–99)
Potassium: 3.7 mmol/L (ref 3.5–5.1)
Sodium: 140 mmol/L (ref 135–145)
Total Bilirubin: 0.7 mg/dL (ref 0.3–1.2)
Total Protein: 6.8 g/dL (ref 6.5–8.1)

## 2019-01-25 LAB — CBC WITH DIFFERENTIAL/PLATELET
Abs Immature Granulocytes: 0.05 10*3/uL (ref 0.00–0.07)
Basophils Absolute: 0.1 10*3/uL (ref 0.0–0.1)
Basophils Relative: 1 %
Eosinophils Absolute: 0.2 10*3/uL (ref 0.0–0.5)
Eosinophils Relative: 3 %
HCT: 28.2 % — ABNORMAL LOW (ref 36.0–46.0)
Hemoglobin: 8 g/dL — ABNORMAL LOW (ref 12.0–15.0)
Immature Granulocytes: 1 %
Lymphocytes Relative: 12 %
Lymphs Abs: 1 10*3/uL (ref 0.7–4.0)
MCH: 25.4 pg — ABNORMAL LOW (ref 26.0–34.0)
MCHC: 28.4 g/dL — ABNORMAL LOW (ref 30.0–36.0)
MCV: 89.5 fL (ref 80.0–100.0)
Monocytes Absolute: 0.4 10*3/uL (ref 0.1–1.0)
Monocytes Relative: 5 %
Neutro Abs: 6.5 10*3/uL (ref 1.7–7.7)
Neutrophils Relative %: 78 %
Platelets: 311 10*3/uL (ref 150–400)
RBC: 3.15 MIL/uL — ABNORMAL LOW (ref 3.87–5.11)
RDW: 18.4 % — ABNORMAL HIGH (ref 11.5–15.5)
WBC: 8.2 10*3/uL (ref 4.0–10.5)
nRBC: 0 % (ref 0.0–0.2)

## 2019-01-25 LAB — VITAMIN B12: Vitamin B-12: 299 pg/mL (ref 180–914)

## 2019-01-25 NOTE — Progress Notes (Signed)
HEMATOLOGY/ONCOLOGY CONSULTATION NOTE  Date of Service: 01/25/2019  Patient Care Team: [name], MD as PCP - General (Internal Medicine) Herminio Commons, MD as PCP - Cardiology (Cardiology) Neldon Labella, RN as [place] Management Michel Bickers, MD as Consulting Physician (Infectious Diseases) Brunetta Genera, MD as Consulting Physician (Hematology)  CHIEF COMPLAINTS/PURPOSE OF CONSULTATION:  Anemia  HISTORY OF PRESENTING ILLNESS:   [name] is a wonderful 68 y.o. female who has been referred to [place] by Dr. Mallory Shirk for evaluation and management of Anemia. The pt reports that she is doing well overall.  The pt reports that she has had anemia for at least the last 3 years. She has had several admissions each year. Her most recent 01/10/19 admission she was given IV Iron and PO iron for maintenance. She denies having IV Iron prior to this. She denies this benefiting her energy levels. She has CHF and notes that she frequently feels low energy levels, sees Dr. Bronson Ing in Cardiology; she also has Afib. The pt has an abscess in her lower right abdomen, is seeing wound care, and has also seen Dr Michel Bickers in ID. She notes that she has had this abscess for the last 8-10 weeks and has completed antibiotic courses.   The pt was admitted for rectal bleeding in January 2020, noting that this was the only time she has had rectal bleeding. She had an upper endoscopy which did not reveal overt signs of bleeding. She has not had a chance to have the recommended colonoscopy yet.   She notes that her DM has been fairly stable, but she was recently started on Levemir BID, and continues on Actos.  The pt notes that she has recurrent UTIs "all the time." She notes that she typically gets these when she is in assisted living or when she is admitted. She notes possible explanations of antibiotic resistance vs hole in bladder. She notes that  she recently finished antibiotics, however she feels that she may have a UTI currently. She denies bladder incontinence currently, but notes this is a regular symptom during her UTIs. She also denies pain or discomfort passing urine.  The pt denies bone pains, fevers, chills, night sweats, unexpected weight loss, or feeling differently today as compared to 6 months ago. She is currently living at home with her husband and granddaughter. She uses a wheelchair to get around mostly, but uses a walker in her home as well. She has been having at home PT but notes that her momentum was cut back by recent infections/admissions.   Most recent lab results (01/16/19) of CBC and BMP is as follows: all values are WNL except for RBC at 3.12, HGB at 7.8, HCT at 27.1, MCH at 25.0, MCHC at 28.8, RDW at 16.1, PLT at 419k, CO2 at 34, Glucose at 105, Calcium at 8.5. 12/28/18 Iron Panel revealed 6% Saturation ratio  On review of systems, pt reports low energy levels, frequent infections, and denies bladder incontinence, upper abdominal pains, discomfort passing urine, fevers, chills, night sweats, unexpected weight loss, bone pains, and any other symptoms.   On PMHx the pt reports CHF, Afib, DM. On Family Hx the pt reports paternal 60 with colon cancer, paternal cousin with colon cancer.    MEDICAL HISTORY:  Past Medical History:  Diagnosis Date   Atrial fibrillation (HCC)    CHF (congestive heart failure) ([place])    Depression    Diabetes mellitus without complication ([place])  History of transesophageal echocardiography (TEE) 03/2018   LV thrombus   Hypertension    Morbid obesity ([place])    Osteoporosis    Urinary incontinence    UTI (lower urinary tract infection) 01/2016   Cipro for Klebsiella pneumoniae isolate   Wheelchair bound    bedbound    SURGICAL HISTORY: Past Surgical History:  Procedure Laterality Date   ABDOMINAL HYSTERECTOMY     BIOPSY  09/06/2018   Procedure: BIOPSY;   Surgeon: Danie Binder, MD;  [facility];  Service: Endoscopy;;  gastric bx's   CESAREAN SECTION     CHOLECYSTECTOMY     ESOPHAGEAL DILATION  09/06/2018   Procedure: ESOPHAGEAL DILATION;  Surgeon: Danie Binder, MD;  [facility];  Service: Endoscopy;;   ESOPHAGOGASTRODUODENOSCOPY (EGD) WITH PROPOFOL N/A 09/06/2018   Procedure: ESOPHAGOGASTRODUODENOSCOPY (EGD) WITH PROPOFOL;  Surgeon: Danie Binder, MD;  [facility];  Service: Endoscopy;  Laterality: N/A;  dilatation   FEMUR IM NAIL Left 02/20/2016   FEMUR IM NAIL Left 02/20/2016   Procedure: INTRAMEDULLARY (IM) RETROGRADE FEMORAL NAILING;  Surgeon: Leandrew Koyanagi, MD;  Location: [place];  Service: Orthopedics;  Laterality: Left;   PANCREAS SURGERY  1967   1 cyst excised and one cyst drained   TEE WITHOUT CARDIOVERSION N/A 04/05/2018   Procedure: TRANSESOPHAGEAL ECHOCARDIOGRAM (TEE);  Surgeon: Dorothy Spark, MD;  [facility]: [facility] END[facility];  Service: Cardiovascular;  Laterality: N/A;   VEIN LIGATION AND STRIPPING     WRIST FRACTURE SURGERY      SOCIAL HISTORY: Social History   Socioeconomic History   Marital status: Married    Spouse name: Not on file   Number of children: Not on file   Years of education: Not on file   Highest education level: Not on file  Occupational History   Occupation: retired  Scientist, product/process development strain: Not on file   Food insecurity:    Worry: Not on file    Inability: Not on file   Transportation needs:    Medical: Not on file    Non-medical: Not on file  Tobacco Use   Smoking status: Never Smoker   Smokeless tobacco: Never Used  Substance and Sexual Activity   Alcohol use: No   Drug use: No   Sexual activity: Not on file  Lifestyle   Physical activity:    Days per week: Not on file    Minutes per session: Not on file   Stress: Not on file  Relationships   Social connections:    Talks on phone: Not on file    Gets  together: Not on file    Attends religious service: Not on file    Active member of club or organization: Not on file    Attends meetings of clubs or organizations: Not on file    Relationship status: Not on file   Intimate partner violence:    Fear of current or ex partner: Not on file    Emotionally abused: Not on file    Physically abused: Not on file    Forced sexual activity: Not on file  Other Topics Concern   Not on file  Social History Narrative   Lives in [place] with husband.  More or less w/c bound since leg fx about 3 yrs ago.    FAMILY HISTORY: Family History  Problem Relation Age of Onset   Diabetes Mother        died in her  26's of a stroke   Cancer Mother    Stroke Mother    Breast cancer Mother    Diabetes Father        died in his 82's of a stroke.   Stroke Father    Diabetes Brother        died @ 57 of a stroke.   Stroke Brother    Diabetes Maternal Grandmother    Diabetes Maternal Grandfather    Diabetes Paternal Grandmother    Diabetes Paternal Grandfather    Breast cancer Maternal Aunt     ALLERGIES:  is allergic to propranolol; topamax [topiramate]; and latex.  MEDICATIONS:  Current Outpatient Medications  Medication Sig Dispense Refill   acetaminophen (TYLENOL) 500 MG tablet Take 500-1,000 mg by mouth every 8 (eight) hours as needed for mild pain or headache.      albuterol (PROVENTIL HFA;VENTOLIN HFA) 108 (90 Base) MCG/ACT inhaler Inhale 2 puffs into the lungs every 6 (six) hours as needed for wheezing or shortness of breath. (Patient taking differently: Inhale 2 puffs into the lungs every 6 (six) hours as needed for wheezing or shortness of breath (As needed). ) 1 Inhaler 2   apixaban (ELIQUIS) 5 MG TABS tablet Take 1 tablet (5 mg total) by mouth 2 (two) times daily. 60 tablet 3   diltiazem (CARDIZEM CD) 120 MG 24 hr capsule Take 1 capsule (120 mg total) by mouth daily. (Patient taking differently: Take 120 mg by mouth  daily. Cartia XT)     escitalopram (LEXAPRO) 10 MG tablet Take 10 mg by mouth daily.     ferrous sulfate (FERROUSUL) 325 (65 FE) MG tablet Take 1 tablet (325 mg total) by mouth 2 (two) times daily with a meal. 60 tablet 0   fluticasone (FLONASE) 50 MCG/ACT nasal spray Place 2 sprays into both nostrils daily as needed for allergies or rhinitis (congestion).      furosemide (LASIX) 20 MG tablet Take 2 tablets (40 mg total) by mouth daily.     glipiZIDE (GLUCOTROL) 10 MG tablet Take 10 mg by mouth 2 (two) times daily.  2   metoprolol tartrate (LOPRESSOR) 25 MG tablet Take 1 tablet (25 mg total) by mouth 2 (two) times daily. (Patient taking differently: Take 50 mg by mouth every morning. )     nystatin (MYCOSTATIN/NYSTOP) powder Apply topically daily as needed (rash in skin folds).     OVER THE COUNTER MEDICATION Apply 1 application topically daily as needed (rash in skin folds). Baby butt paste     pantoprazole (PROTONIX) 40 MG tablet Take 1 tablet (40 mg total) by mouth daily before breakfast. (Patient taking differently: Take 40 mg by mouth daily as needed (for GERD/acid reflux). )     pioglitazone (ACTOS) 45 MG tablet Take 45 mg by mouth daily.     senna-docusate (SENOKOT-S) 8.6-50 MG tablet Take 1 tablet by mouth at bedtime as needed for mild constipation. 10 tablet 0   LEVEMIR FLEXTOUCH 100 UNIT/ML Pen Inject 10 Units into the skin 2 (two) times a day.     No current facility-administered medications for this visit.     REVIEW OF SYSTEMS:    10 Point review of Systems was done is negative except as noted above.  PHYSICAL EXAMINATION:  . Vitals:   01/25/19 1320  BP: (!) 170/64  Pulse: 65  Resp: 18  Temp: 98.3 F (36.8 C)  SpO2: 94%   Filed Weights   01/25/19 1320  Weight: 233 lb 14.4 oz (  106.1 kg)   .Body mass index is 38.92 kg/m.  GENERAL:alert, in no acute distress and comfortable SKIN: no acute rashes, no significant lesions EYES: conjunctiva are pink and  non-injected, sclera anicteric OROPHARYNX: MMM, no exudates, no oropharyngeal erythema or ulceration NECK: supple, no JVD LYMPH:  no palpable lymphadenopathy in the cervical, axillary or inguinal regions LUNGS: clear to auscultation b/l with normal respiratory effort HEART: regular rate & rhythm ABDOMEN:  normoactive bowel sounds , non tender, not distended. Extremity: no pedal edema PSYCH: alert & oriented x 3 with fluent speech NEURO: no focal motor/sensory deficits  LABORATORY DATA:  I have reviewed the data as listed  . CBC Latest Ref Rng & Units 01/25/2019 01/16/2019 01/15/2019  WBC 4.0 - 10.5 K/uL 8.2 10.4 11.9(H)  Hemoglobin 12.0 - 15.0 g/dL 8.0(L) 7.8(L) 8.5(L)  Hematocrit 36.0 - 46.0 % 28.2(L) 27.1(L) 28.5(L)  Platelets 150 - 400 K/uL 311 419(H) 465(H)   . CBC    Component Value Date/Time   WBC 8.2 01/25/2019 1354   RBC 3.15 (L) 01/25/2019 1354   HGB 8.0 (L) 01/25/2019 1354   HCT 28.2 (L) 01/25/2019 1354   PLT 311 01/25/2019 1354   MCV 89.5 01/25/2019 1354   MCH 25.4 (L) 01/25/2019 1354   MCHC 28.4 (L) 01/25/2019 1354   RDW 18.4 (H) 01/25/2019 1354   LYMPHSABS 1.0 01/25/2019 1354   MONOABS 0.4 01/25/2019 1354   EOSABS 0.2 01/25/2019 1354   BASOSABS 0.1 01/25/2019 1354    . CMP Latest Ref Rng & Units 01/25/2019 01/16/2019 01/14/2019  Glucose 70 - 99 mg/dL 147(H) 105(H) 169(H)  BUN 8 - 23 mg/dL 13 22 25(H)  Creatinine 0.44 - 1.00 mg/dL 0.81 0.91 1.18(H)  Sodium 135 - 145 mmol/L 140 140 138  Potassium 3.5 - 5.1 mmol/L 3.7 4.3 4.3  Chloride 98 - 111 mmol/L 103 99 98  CO2 22 - 32 mmol/L 27 34(H) 29  Calcium 8.9 - 10.3 mg/dL 8.8(L) 8.5(L) 8.7(L)  Total Protein 6.5 - 8.1 g/dL 6.8 - -  Total Bilirubin 0.3 - 1.2 mg/dL 0.7 - -  Alkaline Phos 38 - 126 U/L 91 - -  AST 15 - 41 U/L 7(L) - -  ALT 0 - 44 U/L <6 - -   . Lab Results  Component Value Date   IRON 11 (L) 01/12/2019   TIBC 343 01/12/2019   IRONPCTSAT 3 (L) 01/12/2019   (Iron and TIBC)  Lab Results    Component Value Date   FERRITIN 33 01/12/2019     12/28/18 Iron Panel:    10/15/18 CBC w/diff:    RADIOGRAPHIC STUDIES: I have personally reviewed the radiological images as listed and agreed with the findings in the report. Ct Abdomen Pelvis Wo Contrast  Result Date: 01/10/2019 CLINICAL DATA:  3 day history of weakness with superficial wounds in the left lower quadrant abdominal wall. EXAM: CT ABDOMEN AND PELVIS WITHOUT CONTRAST TECHNIQUE: Multidetector CT imaging of the abdomen and pelvis was performed following the standard protocol without IV contrast. COMPARISON:  12/05/2018 FINDINGS: Lower chest: Tiny left and small right pleural effusions with dependent atelectasis in the lower lobes bilaterally. Hepatobiliary: No focal abnormality in the liver on this study without intravenous contrast. The liver shows diffusely decreased attenuation suggesting steatosis. Gallbladder is surgically absent. Extrahepatic common duct is dilated up to 17 mm diameter which is stable to 17 mm on the prior study. Pancreas: Diffusely atrophic without main duct dilatation. Spleen: No splenomegaly. No focal mass lesion. Adrenals/Urinary Tract:  No adrenal nodule or mass. Kidneys are atrophic bilaterally without hydronephrosis. No evidence for hydroureter. Prominent gas volume noted in the urinary bladder. Stomach/Bowel: Stomach is unremarkable. No gastric wall thickening. No evidence of outlet obstruction. Duodenum is normally positioned as is the ligament of Treitz. No small bowel wall thickening. No small bowel dilatation. The terminal ileum is normal. The appendix is not visualized, but there is no edema or inflammation in the region of the cecum. No gross colonic mass. No colonic wall thickening. Diverticular changes are noted in the left colon without evidence of diverticulitis. Vascular/Lymphatic: There is abdominal aortic atherosclerosis without aneurysm. There is no gastrohepatic or hepatoduodenal ligament  lymphadenopathy. No intraperitoneal or retroperitoneal lymphadenopathy. No pelvic sidewall lymphadenopathy. Reproductive: The uterus is surgically absent. No left adnexal mass. 4.3 cm benign appearing cystic lesion in the right adnexal space is stable in the interval. Other: No intraperitoneal free fluid. There is some edema in the porta hepatis and along the inferior right liver. Musculoskeletal: Diffuse body wall edema noted. Skin thickening noted lower anterior abdominal wall with superficial gas in the subcutaneous fat identified in the right lower quadrant of the anterior abdominal wall. Bones are diffusely demineralized. No worrisome lytic or sclerotic osseous abnormality. IMPRESSION: 1. Minimal edema identified in the hepato duodenal ligament/porta hepatis. Etiology not evident on this study, but duodenitis and pancreatitis not excluded. 2. Large gas volume in the urinary bladder. In the absence of recent bladder instrumentation, infection should be considered. 3. Stable dilation of the extrahepatic common duct in this patient status post cholecystectomy. 4. Skin thickening lower anterior abdominal wall, right greater than left with subcutaneous superficial gas bubbles in the subcutaneous fat of the anterior right lower abdominal wall compatible with the reported wound. 5. Stable appearance of benign appearing right ovarian cyst measuring 4.3 cm is stable back to 09/03/2018. While interval stability is reassuring, patient age and cysts size indicate follow-up pelvic ultrasound to further evaluate. This recommendation follows ACR consensus guidelines: White Paper of the ACR Incidental Findings Committee II on Adnexal Findings. J Am Coll Radiol 2013:10:675-681. 6. Small right and tiny left pleural effusion with some dependent atelectasis in the lower lobes bilaterally. 7.  Aortic Atherosclerois (ICD10-170.0) Electronically Signed   By: Misty Stanley M.D.   On: 01/10/2019 19:33   Dg Chest Port 1 View  Result  Date: 01/10/2019 CLINICAL DATA:  Weakness EXAM: PORTABLE CHEST 1 VIEW COMPARISON:  08/16/2018 FINDINGS: Low volume AP portable examination. Cardiomegaly with mild pulmonary vascular prominence but without overt edema. No focal airspace opacity. IMPRESSION: Low volume AP portable examination. Cardiomegaly with mild pulmonary vascular prominence but without overt edema. No focal airspace opacity. Electronically Signed   By: Eddie Candle M.D.   On: 01/10/2019 17:22   Dg Abd 2 Views  Result Date: 01/11/2019 CLINICAL DATA:  Nausea, vomiting and right lower quadrant pain for 1 day. EXAM: ABDOMEN - 2 VIEW COMPARISON:  CT abdomen and pelvis 01/10/2019. FINDINGS: The bowel gas pattern is normal. There is no evidence of free air. No radio-opaque calculi or other significant radiographic abnormality is seen. Surgical clips in the right upper quadrant of the abdomen and intramedullary nail in the visualized left femur noted. IMPRESSION: No acute finding. Electronically Signed   By: Inge Rise M.D.   On: 01/11/2019 14:52    ASSESSMENT & PLAN:   68 y.o. female with  1.Normocytic anemia. Anemia PLAN -Discussed patient's most recent labs from 01/16/19, HGB at 7.8 with MCV at 86.9. WBC normal at  10.4k. PLT slightly elevated at 419k. -Reviewed previous labs which reveal that the pt has been anemic since at least 2017. She has also had multiple admissions and infections over this time. -Possible colonization leading to UTIs? -Slow GI bleeding while on anticoagulation? Recommend that pt follow up with GI for colonoscopy, which she does intend to do. Sees Dr. Barney Drain in GI. -Last Ferritin from 01/12/19 was at 39. Ferritin could be falsely elevated in setting of frequent infections and inflammation -Recommend goal of Ferritin >100 -Recommend replacing iron IV, which the pt does prefer -Discussed that pt may need ongoing follow up related to her anemia, and as she lives in [place], she may benefit from  being seen at [facility], which she would like to do. -Expect that addressing infection issues and optimizing her iron stores will likely improve her HGB -Begin Vitamin B complex -Will order labs today -Will set pt up for additional IV Injectafer dose here, and then transition her care to [facility] -Will see the pt back as needed    Labs today Plz schedule for IV Injectafer x 1 in 1 week Patient prefers to f/u at [facility] since she lives in [place]. Will need to setup f/u with Dr Delton Coombes at [facility] in 2 months with labs   All of the patients questions were answered with apparent satisfaction. The patient knows to call the clinic with any problems, questions or concerns.  The total time spent in the appt was 45 minutes and more than 50% was on counseling and direct patient cares.    Sullivan Lone MD MS AAHIVMS [facility] Hematology/Oncology Physician [facility]  (Office):       [phone] (Work cell):  [phone] (Fax):           [phone]  01/25/2019 1:47 PM  I, Baldwin Jamaica, am acting as a scribe for Dr. Sullivan Lone.   .I have reviewed the above documentation for accuracy and completeness, and I agree with the above. Brunetta Genera MD

## 2019-01-25 NOTE — Telephone Encounter (Signed)
Scheduled appt per 6/9 los. Spoke with patient, patient aware of appt time.  Sent HIM a staff message in regards to transferring the patient care to [facility].

## 2019-01-26 ENCOUNTER — Ambulatory Visit: Payer: BLUE CROSS/BLUE SHIELD | Admitting: Internal Medicine

## 2019-01-26 ENCOUNTER — Other Ambulatory Visit: Payer: Self-pay

## 2019-01-26 DIAGNOSIS — L03311 Cellulitis of abdominal wall: Secondary | ICD-10-CM | POA: Diagnosis not present

## 2019-01-26 DIAGNOSIS — E1165 Type 2 diabetes mellitus with hyperglycemia: Secondary | ICD-10-CM | POA: Diagnosis not present

## 2019-01-26 DIAGNOSIS — Z87440 Personal history of urinary (tract) infections: Secondary | ICD-10-CM | POA: Diagnosis not present

## 2019-01-26 DIAGNOSIS — N183 Chronic kidney disease, stage 3 (moderate): Secondary | ICD-10-CM | POA: Diagnosis not present

## 2019-01-26 DIAGNOSIS — S7292XA Unspecified fracture of left femur, initial encounter for closed fracture: Secondary | ICD-10-CM | POA: Diagnosis not present

## 2019-01-26 DIAGNOSIS — F329 Major depressive disorder, single episode, unspecified: Secondary | ICD-10-CM | POA: Diagnosis not present

## 2019-01-26 DIAGNOSIS — I130 Hypertensive heart and chronic kidney disease with heart failure and stage 1 through stage 4 chronic kidney disease, or unspecified chronic kidney disease: Secondary | ICD-10-CM | POA: Diagnosis not present

## 2019-01-26 DIAGNOSIS — I482 Chronic atrial fibrillation, unspecified: Secondary | ICD-10-CM | POA: Diagnosis not present

## 2019-01-26 DIAGNOSIS — E1122 Type 2 diabetes mellitus with diabetic chronic kidney disease: Secondary | ICD-10-CM | POA: Diagnosis not present

## 2019-01-26 DIAGNOSIS — Z6841 Body Mass Index (BMI) 40.0 and over, adult: Secondary | ICD-10-CM | POA: Diagnosis not present

## 2019-01-26 DIAGNOSIS — Z9181 History of falling: Secondary | ICD-10-CM | POA: Diagnosis not present

## 2019-01-26 DIAGNOSIS — J449 Chronic obstructive pulmonary disease, unspecified: Secondary | ICD-10-CM | POA: Diagnosis not present

## 2019-01-26 DIAGNOSIS — S31109D Unspecified open wound of abdominal wall, unspecified quadrant without penetration into peritoneal cavity, subsequent encounter: Secondary | ICD-10-CM | POA: Diagnosis not present

## 2019-01-26 DIAGNOSIS — I5032 Chronic diastolic (congestive) heart failure: Secondary | ICD-10-CM | POA: Diagnosis not present

## 2019-01-26 LAB — KAPPA/LAMBDA LIGHT CHAINS
Kappa free light chain: 45.7 mg/L — ABNORMAL HIGH (ref 3.3–19.4)
Kappa, lambda light chain ratio: 1.38 (ref 0.26–1.65)
Lambda free light chains: 33.2 mg/L — ABNORMAL HIGH (ref 5.7–26.3)

## 2019-01-26 LAB — ERYTHROPOIETIN: Erythropoietin: 53.6 m[IU]/mL — ABNORMAL HIGH (ref 2.6–18.5)

## 2019-01-26 NOTE — Patient Outreach (Signed)
[facility]) Care Management  01/26/2019  Regine M Perine 04-30-1951 [id number]   Attempted outreach with Mrs. Ebel to complete scheduled telephonic assessment. She requested to reschedule. Reports home health therapist was arriving to complete initial intake assessment. She has multiple pending appointments this week. She prefers to complete the telephonic assessment next week. Agreeable to follow up on Monday.   PLAN -Will follow up on 01/31/19.   [place] [phone]

## 2019-01-27 ENCOUNTER — Telehealth: Payer: Self-pay | Admitting: Cardiovascular Disease

## 2019-01-27 DIAGNOSIS — E1165 Type 2 diabetes mellitus with hyperglycemia: Secondary | ICD-10-CM | POA: Diagnosis not present

## 2019-01-27 DIAGNOSIS — Z87440 Personal history of urinary (tract) infections: Secondary | ICD-10-CM | POA: Diagnosis not present

## 2019-01-27 DIAGNOSIS — S31109D Unspecified open wound of abdominal wall, unspecified quadrant without penetration into peritoneal cavity, subsequent encounter: Secondary | ICD-10-CM | POA: Diagnosis not present

## 2019-01-27 DIAGNOSIS — E1122 Type 2 diabetes mellitus with diabetic chronic kidney disease: Secondary | ICD-10-CM | POA: Diagnosis not present

## 2019-01-27 DIAGNOSIS — J449 Chronic obstructive pulmonary disease, unspecified: Secondary | ICD-10-CM | POA: Diagnosis not present

## 2019-01-27 DIAGNOSIS — F329 Major depressive disorder, single episode, unspecified: Secondary | ICD-10-CM | POA: Diagnosis not present

## 2019-01-27 DIAGNOSIS — Z6841 Body Mass Index (BMI) 40.0 and over, adult: Secondary | ICD-10-CM | POA: Diagnosis not present

## 2019-01-27 DIAGNOSIS — L03311 Cellulitis of abdominal wall: Secondary | ICD-10-CM | POA: Diagnosis not present

## 2019-01-27 DIAGNOSIS — I130 Hypertensive heart and chronic kidney disease with heart failure and stage 1 through stage 4 chronic kidney disease, or unspecified chronic kidney disease: Secondary | ICD-10-CM | POA: Diagnosis not present

## 2019-01-27 DIAGNOSIS — I5032 Chronic diastolic (congestive) heart failure: Secondary | ICD-10-CM | POA: Diagnosis not present

## 2019-01-27 DIAGNOSIS — I482 Chronic atrial fibrillation, unspecified: Secondary | ICD-10-CM | POA: Diagnosis not present

## 2019-01-27 DIAGNOSIS — N183 Chronic kidney disease, stage 3 (moderate): Secondary | ICD-10-CM | POA: Diagnosis not present

## 2019-01-27 LAB — MULTIPLE MYELOMA PANEL, SERUM
Albumin SerPl Elph-Mcnc: 3 g/dL (ref 2.9–4.4)
Albumin/Glob SerPl: 1 (ref 0.7–1.7)
Alpha 1: 0.3 g/dL (ref 0.0–0.4)
Alpha2 Glob SerPl Elph-Mcnc: 1 g/dL (ref 0.4–1.0)
B-Globulin SerPl Elph-Mcnc: 0.8 g/dL (ref 0.7–1.3)
Gamma Glob SerPl Elph-Mcnc: 1 g/dL (ref 0.4–1.8)
Globulin, Total: 3.1 g/dL (ref 2.2–3.9)
IgA: 256 mg/dL (ref 87–352)
IgG (Immunoglobin G), Serum: 1167 mg/dL (ref 586–1602)
IgM (Immunoglobulin M), Srm: 77 mg/dL (ref 26–217)
Total Protein ELP: 6.1 g/dL (ref 6.0–8.5)

## 2019-01-27 NOTE — Telephone Encounter (Signed)
Home health nurse said 3 days ago pt 's lungs were clear,now she hears crackles at bases.Weight 231 lbs which is 6 lbs less (237 lbs) at home 3 days ago. Pulse ox varies 88-93% now.Was 96% 3 days ago   Nurse tells me she missed two days of lasix this week.She also states she only takes lasix 20 mg daily.Per hosp d/c instructions from 5/31, pt is to take 2 -20 mg tablets daily which she has not.

## 2019-01-27 NOTE — Telephone Encounter (Signed)
I spoke with Janett Billow, she will have patient take lasix 40 mg daily

## 2019-01-27 NOTE — Telephone Encounter (Signed)
I would encourage her to start taking 40 mg daily.

## 2019-01-27 NOTE — Telephone Encounter (Signed)
Home Health called stating that patient is having issues with her oxygen going up and down. Shortness of breath x 2 days now. Patient is retaining fluid.  BP 170/90.  Janett Billow with [place] [facility]    [phone].

## 2019-01-31 ENCOUNTER — Telehealth: Payer: Self-pay | Admitting: *Deleted

## 2019-01-31 ENCOUNTER — Other Ambulatory Visit: Payer: Self-pay

## 2019-01-31 DIAGNOSIS — J449 Chronic obstructive pulmonary disease, unspecified: Secondary | ICD-10-CM

## 2019-01-31 DIAGNOSIS — N183 Chronic kidney disease, stage 3 unspecified: Secondary | ICD-10-CM

## 2019-01-31 DIAGNOSIS — E1165 Type 2 diabetes mellitus with hyperglycemia: Secondary | ICD-10-CM

## 2019-01-31 DIAGNOSIS — E1169 Type 2 diabetes mellitus with other specified complication: Secondary | ICD-10-CM | POA: Diagnosis not present

## 2019-01-31 DIAGNOSIS — E611 Iron deficiency: Secondary | ICD-10-CM | POA: Diagnosis not present

## 2019-01-31 DIAGNOSIS — I5023 Acute on chronic systolic (congestive) heart failure: Secondary | ICD-10-CM

## 2019-01-31 DIAGNOSIS — I10 Essential (primary) hypertension: Secondary | ICD-10-CM | POA: Diagnosis not present

## 2019-01-31 NOTE — Telephone Encounter (Signed)
Contacted patient regarding test results per Dr. Grier Mitts directions in attached note. Patient states she has already started taking B12 this over weekend. Patient requested that copy of most recent labs be faxed to Dr. Fara Olden at [place], MD @ [facility] [phone]. States that way she wont have to have them repeated. Labs faxed, fax confirmation received.

## 2019-01-31 NOTE — Patient Outreach (Signed)
[facility]) Care Management  01/31/2019  Niana M Welke 04/28/1951 [id number]   Outreach for completion of telephonic assessment. Mrs. Agro states she is unable to complete the assessment today. Reports being very busy and attempting to coordinate care for her brother. He is currently in a residential facility. Reports receiving several calls today from facility staff. Offered options for outreach later this week. She prefers not to reschedule.   Discussed immediate care management needs. Mrs. Liberati reports completing telephonic outreach with Dr. Fara Olden earlier today. Denied urgent concerns but has pending medication orders. Per pharmacy staff, prescription for Spiriva has been pending since February. She reports not obtaining prescription due to cost. She is agreeable to outreach from a Pharmacist. Mrs. Bobo was strongly encouraged to contact me if needed. Even if she is unable to complete the initial telephonic assessment, immediate needs and urgent concerns can be addressed. She is agreeable and states she will contact me when she is available.  PLAN -Anticipate completing assessment within the next week.  [place] [phone]

## 2019-01-31 NOTE — Telephone Encounter (Signed)
-----   Message from Brunetta Genera, MD sent at 01/30/2019 11:55 PM EDT ----- plz recommend patient start B12 1036mg otc. thx

## 2019-02-01 ENCOUNTER — Other Ambulatory Visit: Payer: Self-pay | Admitting: Hematology

## 2019-02-01 ENCOUNTER — Inpatient Hospital Stay: Payer: BC Managed Care – PPO

## 2019-02-01 ENCOUNTER — Other Ambulatory Visit: Payer: Self-pay

## 2019-02-01 VITALS — BP 157/60 | HR 63 | Temp 98.2°F | Resp 19

## 2019-02-01 DIAGNOSIS — E1122 Type 2 diabetes mellitus with diabetic chronic kidney disease: Secondary | ICD-10-CM | POA: Diagnosis not present

## 2019-02-01 DIAGNOSIS — E1165 Type 2 diabetes mellitus with hyperglycemia: Secondary | ICD-10-CM | POA: Diagnosis not present

## 2019-02-01 DIAGNOSIS — F329 Major depressive disorder, single episode, unspecified: Secondary | ICD-10-CM | POA: Diagnosis not present

## 2019-02-01 DIAGNOSIS — L03311 Cellulitis of abdominal wall: Secondary | ICD-10-CM | POA: Diagnosis not present

## 2019-02-01 DIAGNOSIS — N183 Chronic kidney disease, stage 3 (moderate): Secondary | ICD-10-CM | POA: Diagnosis not present

## 2019-02-01 DIAGNOSIS — D508 Other iron deficiency anemias: Secondary | ICD-10-CM

## 2019-02-01 DIAGNOSIS — I700 Atherosclerosis of aorta: Secondary | ICD-10-CM | POA: Diagnosis not present

## 2019-02-01 DIAGNOSIS — I482 Chronic atrial fibrillation, unspecified: Secondary | ICD-10-CM | POA: Diagnosis not present

## 2019-02-01 DIAGNOSIS — E119 Type 2 diabetes mellitus without complications: Secondary | ICD-10-CM | POA: Diagnosis not present

## 2019-02-01 DIAGNOSIS — I5032 Chronic diastolic (congestive) heart failure: Secondary | ICD-10-CM | POA: Diagnosis not present

## 2019-02-01 DIAGNOSIS — Z79899 Other long term (current) drug therapy: Secondary | ICD-10-CM | POA: Diagnosis not present

## 2019-02-01 DIAGNOSIS — S31109D Unspecified open wound of abdominal wall, unspecified quadrant without penetration into peritoneal cavity, subsequent encounter: Secondary | ICD-10-CM | POA: Diagnosis not present

## 2019-02-01 DIAGNOSIS — I130 Hypertensive heart and chronic kidney disease with heart failure and stage 1 through stage 4 chronic kidney disease, or unspecified chronic kidney disease: Secondary | ICD-10-CM | POA: Diagnosis not present

## 2019-02-01 DIAGNOSIS — D649 Anemia, unspecified: Secondary | ICD-10-CM | POA: Diagnosis not present

## 2019-02-01 DIAGNOSIS — Z87440 Personal history of urinary (tract) infections: Secondary | ICD-10-CM | POA: Diagnosis not present

## 2019-02-01 DIAGNOSIS — I110 Hypertensive heart disease with heart failure: Secondary | ICD-10-CM | POA: Diagnosis not present

## 2019-02-01 DIAGNOSIS — I4891 Unspecified atrial fibrillation: Secondary | ICD-10-CM | POA: Diagnosis not present

## 2019-02-01 DIAGNOSIS — D509 Iron deficiency anemia, unspecified: Secondary | ICD-10-CM | POA: Insufficient documentation

## 2019-02-01 DIAGNOSIS — Z6841 Body Mass Index (BMI) 40.0 and over, adult: Secondary | ICD-10-CM | POA: Diagnosis not present

## 2019-02-01 DIAGNOSIS — J449 Chronic obstructive pulmonary disease, unspecified: Secondary | ICD-10-CM | POA: Diagnosis not present

## 2019-02-01 MED ORDER — SODIUM CHLORIDE 0.9 % IV SOLN
750.0000 mg | Freq: Once | INTRAVENOUS | Status: AC
  Administered 2019-02-01: 17:00:00 750 mg via INTRAVENOUS
  Filled 2019-02-01: qty 15

## 2019-02-01 MED ORDER — SODIUM CHLORIDE 0.9 % IV SOLN
Freq: Once | INTRAVENOUS | Status: AC
  Administered 2019-02-01: 17:00:00 via INTRAVENOUS
  Filled 2019-02-01: qty 250

## 2019-02-01 NOTE — Patient Instructions (Signed)

## 2019-02-01 NOTE — Progress Notes (Signed)
Patient presented with elevated BP today. (see flowsheet) She reports she did not take her lasix today, as her home health nurse and home PT were coming by today, and she did not want the frequent interruptions as a result of taking lasix.  Per Dr. Alvy Bimler, okay to proceed with iv iron today with elevated BP. This RN educated patient to continue with lasix and metoprolol as prescribed at home. Per Dr. Alvy Bimler no PRN to be given today in infusion. Report given to [place] Nurse., RN.

## 2019-02-02 DIAGNOSIS — Z9181 History of falling: Secondary | ICD-10-CM | POA: Diagnosis not present

## 2019-02-02 DIAGNOSIS — S7292XA Unspecified fracture of left femur, initial encounter for closed fracture: Secondary | ICD-10-CM | POA: Diagnosis not present

## 2019-02-02 DIAGNOSIS — J449 Chronic obstructive pulmonary disease, unspecified: Secondary | ICD-10-CM | POA: Diagnosis not present

## 2019-02-03 ENCOUNTER — Other Ambulatory Visit: Payer: Self-pay | Admitting: Pharmacist

## 2019-02-03 DIAGNOSIS — F329 Major depressive disorder, single episode, unspecified: Secondary | ICD-10-CM | POA: Diagnosis not present

## 2019-02-03 DIAGNOSIS — Z87440 Personal history of urinary (tract) infections: Secondary | ICD-10-CM | POA: Diagnosis not present

## 2019-02-03 DIAGNOSIS — I130 Hypertensive heart and chronic kidney disease with heart failure and stage 1 through stage 4 chronic kidney disease, or unspecified chronic kidney disease: Secondary | ICD-10-CM | POA: Diagnosis not present

## 2019-02-03 DIAGNOSIS — S31109D Unspecified open wound of abdominal wall, unspecified quadrant without penetration into peritoneal cavity, subsequent encounter: Secondary | ICD-10-CM | POA: Diagnosis not present

## 2019-02-03 DIAGNOSIS — E1122 Type 2 diabetes mellitus with diabetic chronic kidney disease: Secondary | ICD-10-CM | POA: Diagnosis not present

## 2019-02-03 DIAGNOSIS — N183 Chronic kidney disease, stage 3 (moderate): Secondary | ICD-10-CM | POA: Diagnosis not present

## 2019-02-03 DIAGNOSIS — I482 Chronic atrial fibrillation, unspecified: Secondary | ICD-10-CM | POA: Diagnosis not present

## 2019-02-03 DIAGNOSIS — I5032 Chronic diastolic (congestive) heart failure: Secondary | ICD-10-CM | POA: Diagnosis not present

## 2019-02-03 DIAGNOSIS — E1165 Type 2 diabetes mellitus with hyperglycemia: Secondary | ICD-10-CM | POA: Diagnosis not present

## 2019-02-03 DIAGNOSIS — J449 Chronic obstructive pulmonary disease, unspecified: Secondary | ICD-10-CM | POA: Diagnosis not present

## 2019-02-03 DIAGNOSIS — L03311 Cellulitis of abdominal wall: Secondary | ICD-10-CM | POA: Diagnosis not present

## 2019-02-03 DIAGNOSIS — Z6841 Body Mass Index (BMI) 40.0 and over, adult: Secondary | ICD-10-CM | POA: Diagnosis not present

## 2019-02-03 NOTE — Patient Outreach (Signed)
[facility]) [place]  02/03/2019  [name] 19-Jul-1951 [id number]  Reason for referral: medication assistance/reconciliation  [facility] pharmacy case is being closed due to the following reasons:  The patient is participating in another care management program.  Warm hand off given verbally to Christena Deem of [place] (embedded PharmD at Dr. Quentin Cornwall office).  [facility] RN CM, Felecia McCray aware of transition.   Patient has been provided [facility] CM contact information if assistance needed in the future.    Thank you for allowing [facility] pharmacy to be involved in this patient's care.    Regina Eck, PharmD, [place]  [phone]

## 2019-02-07 DIAGNOSIS — J449 Chronic obstructive pulmonary disease, unspecified: Secondary | ICD-10-CM | POA: Diagnosis not present

## 2019-02-07 DIAGNOSIS — E1122 Type 2 diabetes mellitus with diabetic chronic kidney disease: Secondary | ICD-10-CM | POA: Diagnosis not present

## 2019-02-07 DIAGNOSIS — I130 Hypertensive heart and chronic kidney disease with heart failure and stage 1 through stage 4 chronic kidney disease, or unspecified chronic kidney disease: Secondary | ICD-10-CM | POA: Diagnosis not present

## 2019-02-07 DIAGNOSIS — S31109D Unspecified open wound of abdominal wall, unspecified quadrant without penetration into peritoneal cavity, subsequent encounter: Secondary | ICD-10-CM | POA: Diagnosis not present

## 2019-02-07 DIAGNOSIS — N183 Chronic kidney disease, stage 3 (moderate): Secondary | ICD-10-CM | POA: Diagnosis not present

## 2019-02-07 DIAGNOSIS — L03311 Cellulitis of abdominal wall: Secondary | ICD-10-CM | POA: Diagnosis not present

## 2019-02-07 DIAGNOSIS — Z87440 Personal history of urinary (tract) infections: Secondary | ICD-10-CM | POA: Diagnosis not present

## 2019-02-07 DIAGNOSIS — Z6841 Body Mass Index (BMI) 40.0 and over, adult: Secondary | ICD-10-CM | POA: Diagnosis not present

## 2019-02-07 DIAGNOSIS — I482 Chronic atrial fibrillation, unspecified: Secondary | ICD-10-CM | POA: Diagnosis not present

## 2019-02-07 DIAGNOSIS — E1165 Type 2 diabetes mellitus with hyperglycemia: Secondary | ICD-10-CM | POA: Diagnosis not present

## 2019-02-07 DIAGNOSIS — F329 Major depressive disorder, single episode, unspecified: Secondary | ICD-10-CM | POA: Diagnosis not present

## 2019-02-07 DIAGNOSIS — I5032 Chronic diastolic (congestive) heart failure: Secondary | ICD-10-CM | POA: Diagnosis not present

## 2019-02-09 ENCOUNTER — Encounter (HOSPITAL_COMMUNITY): Payer: Self-pay | Admitting: *Deleted

## 2019-02-10 ENCOUNTER — Other Ambulatory Visit: Payer: Self-pay

## 2019-02-10 ENCOUNTER — Inpatient Hospital Stay (HOSPITAL_COMMUNITY): Payer: BC Managed Care – PPO | Attending: Hematology | Admitting: Hematology

## 2019-02-10 ENCOUNTER — Inpatient Hospital Stay (HOSPITAL_COMMUNITY): Payer: BC Managed Care – PPO

## 2019-02-10 ENCOUNTER — Encounter (HOSPITAL_COMMUNITY): Payer: Self-pay | Admitting: Hematology

## 2019-02-10 VITALS — BP 160/61 | HR 54 | Temp 97.3°F | Resp 18

## 2019-02-10 DIAGNOSIS — Z7901 Long term (current) use of anticoagulants: Secondary | ICD-10-CM | POA: Diagnosis not present

## 2019-02-10 DIAGNOSIS — E119 Type 2 diabetes mellitus without complications: Secondary | ICD-10-CM

## 2019-02-10 DIAGNOSIS — M818 Other osteoporosis without current pathological fracture: Secondary | ICD-10-CM | POA: Diagnosis not present

## 2019-02-10 DIAGNOSIS — E669 Obesity, unspecified: Secondary | ICD-10-CM

## 2019-02-10 DIAGNOSIS — D508 Other iron deficiency anemias: Secondary | ICD-10-CM

## 2019-02-10 DIAGNOSIS — I10 Essential (primary) hypertension: Secondary | ICD-10-CM | POA: Diagnosis not present

## 2019-02-10 DIAGNOSIS — I4891 Unspecified atrial fibrillation: Secondary | ICD-10-CM

## 2019-02-10 DIAGNOSIS — D509 Iron deficiency anemia, unspecified: Secondary | ICD-10-CM | POA: Diagnosis not present

## 2019-02-10 LAB — CBC WITH DIFFERENTIAL/PLATELET
Abs Immature Granulocytes: 0.05 10*3/uL (ref 0.00–0.07)
Basophils Absolute: 0 10*3/uL (ref 0.0–0.1)
Basophils Relative: 0 %
Eosinophils Absolute: 0.2 10*3/uL (ref 0.0–0.5)
Eosinophils Relative: 2 %
HCT: 31.3 % — ABNORMAL LOW (ref 36.0–46.0)
Hemoglobin: 8.9 g/dL — ABNORMAL LOW (ref 12.0–15.0)
Immature Granulocytes: 1 %
Lymphocytes Relative: 13 %
Lymphs Abs: 1.2 10*3/uL (ref 0.7–4.0)
MCH: 25.9 pg — ABNORMAL LOW (ref 26.0–34.0)
MCHC: 28.4 g/dL — ABNORMAL LOW (ref 30.0–36.0)
MCV: 91.3 fL (ref 80.0–100.0)
Monocytes Absolute: 0.6 10*3/uL (ref 0.1–1.0)
Monocytes Relative: 7 %
Neutro Abs: 7.1 10*3/uL (ref 1.7–7.7)
Neutrophils Relative %: 77 %
Platelets: 309 10*3/uL (ref 150–400)
RBC: 3.43 MIL/uL — ABNORMAL LOW (ref 3.87–5.11)
RDW: 19.5 % — ABNORMAL HIGH (ref 11.5–15.5)
WBC: 9.1 10*3/uL (ref 4.0–10.5)
nRBC: 0 % (ref 0.0–0.2)

## 2019-02-10 LAB — LACTATE DEHYDROGENASE: LDH: 204 U/L — ABNORMAL HIGH (ref 98–192)

## 2019-02-10 LAB — RETICULOCYTES
Immature Retic Fract: 14.4 % (ref 2.3–15.9)
RBC.: 3.43 MIL/uL — ABNORMAL LOW (ref 3.87–5.11)
Retic Count, Absolute: 97.1 10*3/uL (ref 19.0–186.0)
Retic Ct Pct: 2.8 % (ref 0.4–3.1)

## 2019-02-10 LAB — FOLATE: Folate: 9.8 ng/mL (ref >5.9)

## 2019-02-10 NOTE — Assessment & Plan Note (Signed)
1.  Normocytic anemia: - EGD on 09/06/2018 shows mild gastritis.  Moderate Schatzki ring.  This was done when she had a rectal bleeding.  She never had colonoscopy. 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  There is also component of chronic kidney disease. -Iron panel on 01/12/2019 shows ferritin of 33, percent saturation of 3.  Folic acid was normal. - Received Feraheme on 01/13/2019 and Injectafer on 02/01/2019. - Has been taking ferrous sulfate 325 mg twice daily for last 2 to 3 weeks. - Denies any active bleeding per rectum at this time.  Continues to feel tired. - I reviewed blood work from 01/25/2019.  SPEP was negative for M spike.  Hemoglobin was 8 and MCV of 89.5.  Serum erythropoietin was 53.6.  Free light chain ratio was 1.38 with kappa light chains of 45.7 and lambda light chains of 33.2.  B12 was borderline 299. - I have recommended checking CBC today.  We will also check LDH, reticulocyte count, copper level.  Will send for methylmalonic acid. -I will give her 1 more infusion of Feraheme.  We will schedule her for follow-up visit in a month with repeat iron panel ferritin and CBC. - She was encouraged to follow-up with GI for colonoscopy.  2.  Atrial fibrillation: -She is on Eliquis for a long time.

## 2019-02-10 NOTE — Progress Notes (Signed)
CONSULT NOTE  Patient Care Team: [name], MD as PCP - General (Internal Medicine) Herminio Commons, MD as PCP - Cardiology (Cardiology) Neldon Labella, RN as [place] Management Michel Bickers, MD as Consulting Physician (Infectious Diseases) Brunetta Genera, MD as Consulting Physician (Hematology)  CHIEF COMPLAINTS/PURPOSE OF CONSULTATION:  Iron deficiency anemia.  HISTORY OF PRESENTING ILLNESS:  [name] 68 y.o. female is seen in consultation today for further work-up and management of iron deficiency anemia.  She reported that she had anemia for at least a few years.  During her most recent admission to the hospital on 01/10/2019, she received her first infusion of Feraheme on 01/13/2019.  She never had a blood transfusion.  She also has a wound in the right paraumbilical region and sees wound care for it.  Patient reported rectal bleeding in January 2020.  She had an upper endoscopy which did not reveal any signs of bleeding.  She lives in [place] with her husband at home.  She denies any bleeding per rectum or melena at this time.  She was evaluated by Dr. Irene Limbo and was given 1 infusion of Injectafer on 02/01/2019.  Denies any fevers, night sweats or weight loss.  She is also taking ferrous sulfate twice daily for the last 3 weeks.  Since then she reported some dark stools.  She has right abdominal wound for the last 3 to 4 months.  She is on Eliquis secondary to chronic A. fib.  She used to work as a Psychologist, educational.  She was never smoker nonalcoholic.  She is retired at this time.  Family history significant for mother with thyroid cancer and breast cancer.  One paternal uncle had colon cancer.    MEDICAL HISTORY:  Past Medical History:  Diagnosis Date  . Atrial fibrillation ([place])   . CHF (congestive heart failure) ([place])   . Depression   . Diabetes mellitus without complication ([place])   . History of transesophageal  echocardiography (TEE) 03/2018   LV thrombus  . Hypertension   . Morbid obesity ([place])   . Osteoporosis   . Urinary incontinence   . UTI (lower urinary tract infection) 01/2016   Cipro for Klebsiella pneumoniae isolate  . Wheelchair bound    bedbound    SURGICAL HISTORY: Past Surgical History:  Procedure Laterality Date  . ABDOMINAL HYSTERECTOMY    . BIOPSY  09/06/2018   Procedure: BIOPSY;  Surgeon: Danie Binder, MD;  [facility];  Service: Endoscopy;;  gastric bx's  . CESAREAN SECTION    . CHOLECYSTECTOMY    . ESOPHAGEAL DILATION  09/06/2018   Procedure: ESOPHAGEAL DILATION;  Surgeon: Danie Binder, MD;  [facility];  Service: Endoscopy;;  . ESOPHAGOGASTRODUODENOSCOPY (EGD) WITH PROPOFOL N/A 09/06/2018   Procedure: ESOPHAGOGASTRODUODENOSCOPY (EGD) WITH PROPOFOL;  Surgeon: Danie Binder, MD;  [facility];  Service: Endoscopy;  Laterality: N/A;  dilatation  . FEMUR IM NAIL Left 02/20/2016  . FEMUR IM NAIL Left 02/20/2016   Procedure: INTRAMEDULLARY (IM) RETROGRADE FEMORAL NAILING;  Surgeon: Leandrew Koyanagi, MD;  Location: [facility];  Service: Orthopedics;  Laterality: Left;  . PANCREAS SURGERY  1967   1 cyst excised and one cyst drained  . TEE WITHOUT CARDIOVERSION N/A 04/05/2018   Procedure: TRANSESOPHAGEAL ECHOCARDIOGRAM (TEE);  Surgeon: Dorothy Spark, MD;  Location: [facility];  Service: Cardiovascular;  Laterality: N/A;  . [place]    . WRIST FRACTURE SURGERY  SOCIAL HISTORY: Social History   Socioeconomic History  . Marital status: Married    Spouse name: 1  . Number of children: Not on file  . Years of education: Not on file  . Highest education level: Not on file  Occupational History  . Occupation: retired  Scientific laboratory technician  . Financial resource strain: Not on file  . Food insecurity    Worry: Not on file    Inability: Not on file  . Transportation needs    Medical: Not on file    Non-medical: Not on  file  Tobacco Use  . Smoking status: Never Smoker  . Smokeless tobacco: Never Used  Substance and Sexual Activity  . Alcohol use: No  . Drug use: No  . Sexual activity: Not on file  Lifestyle  . Physical activity    Days per week: Not on file    Minutes per session: Not on file  . Stress: Not on file  Relationships  . Social Herbalist on phone: Not on file    Gets together: Not on file    Attends religious service: Not on file    Active member of club or organization: Not on file    Attends meetings of clubs or organizations: Not on file    Relationship status: Not on file  . Intimate partner violence    Fear of current or ex partner: Not on file    Emotionally abused: Not on file    Physically abused: Not on file    Forced sexual activity: Not on file  Other Topics Concern  . Not on file  Social History Narrative   Lives in [place] with husband.  More or less w/c bound since leg fx about 3 yrs ago.    FAMILY HISTORY: Family History  Problem Relation Age of Onset  . Diabetes Mother        died in her 81's of a stroke  . Cancer Mother   . Stroke Mother   . Breast cancer Mother   . Diabetes Father        died in his 93's of a stroke.  . Stroke Father   . Diabetes Brother        died @ 18 of a stroke.  . Stroke Brother   . Diabetes Maternal Grandmother   . Diabetes Maternal Grandfather   . Diabetes Paternal Grandmother   . Diabetes Paternal Grandfather   . Breast cancer Maternal Aunt     ALLERGIES:  is allergic to propranolol; topamax [topiramate]; and latex.  MEDICATIONS:  Current Outpatient Medications  Medication Sig Dispense Refill  . acetaminophen (TYLENOL) 500 MG tablet Take 500-1,000 mg by mouth every 8 (eight) hours as needed for mild pain or headache.     Marland Kitchen apixaban (ELIQUIS) 5 MG TABS tablet Take 1 tablet (5 mg total) by mouth 2 (two) times daily. 60 tablet 3  . buPROPion (WELLBUTRIN XL) 300 MG 24 hr tablet     . diltiazem (CARDIZEM  CD) 120 MG 24 hr capsule Take 1 capsule (120 mg total) by mouth daily. (Patient taking differently: Take 120 mg by mouth daily. Cartia XT)    . escitalopram (LEXAPRO) 10 MG tablet Take 10 mg by mouth daily.    . ferrous sulfate (FERROUSUL) 325 (65 FE) MG tablet Take 1 tablet (325 mg total) by mouth 2 (two) times daily with a meal. 60 tablet 0  . fluticasone (FLONASE) 50 MCG/ACT nasal spray Place 2 sprays  into both nostrils daily as needed for allergies or rhinitis (congestion).     . furosemide (LASIX) 20 MG tablet Take 2 tablets (40 mg total) by mouth daily.    Marland Kitchen glipiZIDE (GLUCOTROL) 10 MG tablet Take 10 mg by mouth 2 (two) times daily.  2  . levalbuterol (XOPENEX) 0.63 MG/3ML nebulizer solution     . LEVEMIR FLEXTOUCH 100 UNIT/ML Pen Inject 10 Units into the skin 2 (two) times a day.    . metoprolol tartrate (LOPRESSOR) 25 MG tablet Take 1 tablet (25 mg total) by mouth 2 (two) times daily. (Patient taking differently: Take 50 mg by mouth every morning. )    . nystatin (MYCOSTATIN/NYSTOP) powder Apply topically daily as needed (rash in skin folds).    Marland Kitchen OVER THE COUNTER MEDICATION Apply 1 application topically daily as needed (rash in skin folds). Baby butt paste    . pantoprazole (PROTONIX) 40 MG tablet Take 1 tablet (40 mg total) by mouth daily before breakfast. (Patient taking differently: Take 40 mg by mouth daily as needed (for GERD/acid reflux). )    . pioglitazone (ACTOS) 45 MG tablet Take 45 mg by mouth daily.    Marland Kitchen senna-docusate (SENOKOT-S) 8.6-50 MG tablet Take 1 tablet by mouth at bedtime as needed for mild constipation. 10 tablet 0  . tiotropium (SPIRIVA) 18 MCG inhalation capsule Place 1.25 mcg into inhaler and inhale daily.    Marland Kitchen albuterol (PROVENTIL HFA;VENTOLIN HFA) 108 (90 Base) MCG/ACT inhaler Inhale 2 puffs into the lungs every 6 (six) hours as needed for wheezing or shortness of breath. (Patient taking differently: Inhale 2 puffs into the lungs every 6 (six) hours as needed for  wheezing or shortness of breath (As needed). ) 1 Inhaler 2   No current facility-administered medications for this visit.     REVIEW OF SYSTEMS:   Constitutional: Denies fevers, chills or abnormal night sweats.  Positive for fatigue. Eyes: Denies blurriness of vision, double vision or watery eyes Ears, nose, mouth, throat, and face: Denies mucositis or sore throat Respiratory: Positive for shortness of breath. Cardiovascular: Denies palpitation, chest discomfort.  Positive lower extremity swelling Gastrointestinal:  Denies nausea, heartburn or change in bowel habits.  Positive for trouble swallowing. Skin: Denies abnormal skin rashes Lymphatics: Denies new lymphadenopathy or easy bruising Neurological:Denies numbness, tingling or new weaknesses Behavioral/Psych: Mood is stable, no new changes  All other systems were reviewed with the patient and are negative.  PHYSICAL EXAMINATION: ECOG PERFORMANCE STATUS: 2 - Symptomatic, <50% confined to bed  Vitals:   02/10/19 1321  BP: (!) 160/61  Pulse: (!) 54  Resp: 18  Temp: (!) 97.3 F (36.3 C)  SpO2: 97%   There were no vitals filed for this visit.  GENERAL:alert, no distress and comfortable SKIN: skin color, texture, turgor are normal, no rashes or significant lesions EYES: normal, conjunctiva are pink and non-injected, sclera clear OROPHARYNX:no exudate, no erythema and lips, buccal mucosa, and tongue normal  NECK: supple, thyroid normal size, non-tender, without nodularity LYMPH:  no palpable lymphadenopathy in the cervical, axillary or inguinal LUNGS: clear to auscultation and percussion with normal breathing effort HEART: regular rate & rhythm and no murmurs.  2+ edema bilaterally. ABDOMEN: Soft obese nontender.  There is bandage on the right periumbilical region. Musculoskeletal:no cyanosis of digits and no clubbing  PSYCH: alert & oriented x 3 with fluent speech NEURO: no focal motor/sensory deficits  LABORATORY DATA:  I  have reviewed the data as listed Recent Results (from the past  2160 hour(s))  Ferritin     Status: None   Collection Time: 11/25/18  7:50 PM  Result Value Ref Range   Ferritin 28 11 - 307 ng/mL    Comment: Performed at [facility], [address] [place], [place] [address]  Haptoglobin     Status: None   Collection Time: 11/25/18  9:50 PM  Result Value Ref Range   Haptoglobin 279 37 - 355 mg/dL    Comment: (NOTE) Performed At: [facility] [place], [place] [id number] Rush Farmer MD WK:[id number]   Urinalysis, Routine w reflex microscopic     Status: Abnormal   Collection Time: 11/27/18  2:46 PM  Result Value Ref Range   Color, Urine YELLOW YELLOW   APPearance CLOUDY (A) CLEAR   Specific Gravity, Urine 1.019 1.005 - 1.030   pH 5.0 5.0 - 8.0   Glucose, UA 50 (A) NEGATIVE mg/dL   Hgb urine dipstick NEGATIVE NEGATIVE   Bilirubin Urine NEGATIVE NEGATIVE   Ketones, ur NEGATIVE NEGATIVE mg/dL   Protein, ur 30 (A) NEGATIVE mg/dL   Nitrite NEGATIVE NEGATIVE   Leukocytes,Ua SMALL (A) NEGATIVE   RBC / HPF 11-20 0 - 5 RBC/hpf   WBC, UA 11-20 0 - 5 WBC/hpf   Bacteria, UA NONE SEEN NONE SEEN   Squamous Epithelial / LPF 0-5 0 - 5   WBC Clumps PRESENT    Mucus PRESENT    Budding Yeast PRESENT     Comment: Performed at [facility], [address] [place], [place] [address]  Comprehensive metabolic panel     Status: Abnormal   Collection Time: 12/05/18  1:18 PM  Result Value Ref Range   Sodium 137 135 - 145 mmol/L   Potassium 4.0 3.5 - 5.1 mmol/L   Chloride 104 98 - 111 mmol/L   CO2 23 22 - 32 mmol/L   Glucose, Bld 325 (H) 70 - 99 mg/dL   BUN 14 8 - 23 mg/dL   Creatinine, Ser 0.67 0.44 - 1.00 mg/dL   Calcium 8.6 (L) 8.9 - 10.3 mg/dL   Total Protein 6.2 (L) 6.5 - 8.1 g/dL   Albumin 2.7 (L) 3.5 - 5.0 g/dL   AST 10 (L) 15 - 41 U/L   ALT 8 0 - 44 U/L   Alkaline Phosphatase 89 38 - 126 U/L   Total Bilirubin 0.7 0.3 - 1.2 mg/dL   GFR calc non Af Amer >60 >60  mL/min   GFR calc Af Amer >60 >60 mL/min   Anion gap 10 5 - 15    Comment: Performed at [facility], [place] [address] [place], [place] [address]  CBC with Differential     Status: Abnormal   Collection Time: 12/05/18  1:18 PM  Result Value Ref Range   WBC 8.0 4.0 - 10.5 K/uL   RBC 3.05 (L) 3.87 - 5.11 MIL/uL   Hemoglobin 7.7 (L) 12.0 - 15.0 g/dL   HCT 26.4 (L) 36.0 - 46.0 %   MCV 86.6 80.0 - 100.0 fL   MCH 25.2 (L) 26.0 - 34.0 pg   MCHC 29.2 (L) 30.0 - 36.0 g/dL   RDW 15.7 (H) 11.5 - 15.5 %   Platelets 316 150 - 400 K/uL   nRBC 0.0 0.0 - 0.2 %   Neutrophils Relative % 73 %   Neutro Abs 5.9 1.7 - 7.7 K/uL   Lymphocytes Relative 15 %   Lymphs Abs 1.2 0.7 - 4.0 K/uL   Monocytes Relative 7 %  Monocytes Absolute 0.6 0.1 - 1.0 K/uL   Eosinophils Relative 3 %   Eosinophils Absolute 0.2 0.0 - 0.5 K/uL   Basophils Relative 1 %   Basophils Absolute 0.1 0.0 - 0.1 K/uL   Immature Granulocytes 1 %   Abs Immature Granulocytes 0.04 0.00 - 0.07 K/uL    Comment: Performed at [facility] [address], [place], [place] [address]  Hemoglobin A1c     Status: Abnormal   Collection Time: 12/05/18  1:18 PM  Result Value Ref Range   Hgb A1c MFr Bld 7.7 (H) 4.8 - 5.6 %    Comment: (NOTE) Pre diabetes:          5.7%-6.4% Diabetes:              >6.4% Glycemic control for   <7.0% adults with diabetes    Mean Plasma Glucose 174.29 mg/dL    Comment: Performed at [facility] [address] [place], [place] [address]  Glucose, capillary     Status: Abnormal   Collection Time: 12/05/18 10:23 PM  Result Value Ref Range   Glucose-Capillary 183 (H) 70 - 99 mg/dL  CBC     Status: Abnormal   Collection Time: 12/06/18  3:29 AM  Result Value Ref Range   WBC 7.5 4.0 - 10.5 K/uL   RBC 2.96 (L) 3.87 - 5.11 MIL/uL   Hemoglobin 7.7 (L) 12.0 - 15.0 g/dL   HCT 25.4 (L) 36.0 - 46.0 %   MCV 85.8 80.0 - 100.0 fL   MCH 26.0 26.0 - 34.0 pg   MCHC 30.3 30.0 - 36.0 g/dL   RDW 15.8 (H) 11.5  - 15.5 %   Platelets 322 150 - 400 K/uL   nRBC 0.0 0.0 - 0.2 %    Comment: Performed at [facility], [place] [address], [place], [place] [address]  Basic metabolic panel     Status: Abnormal   Collection Time: 12/06/18  3:29 AM  Result Value Ref Range   Sodium 137 135 - 145 mmol/L   Potassium 3.9 3.5 - 5.1 mmol/L   Chloride 102 98 - 111 mmol/L   CO2 26 22 - 32 mmol/L   Glucose, Bld 189 (H) 70 - 99 mg/dL   BUN 11 8 - 23 mg/dL   Creatinine, Ser 0.65 0.44 - 1.00 mg/dL   Calcium 8.6 (L) 8.9 - 10.3 mg/dL   GFR calc non Af Amer >60 >60 mL/min   GFR calc Af Amer >60 >60 mL/min   Anion gap 9 5 - 15    Comment: Performed at [facility], [place] [address] [place], [place] [address]  Culture, Urine     Status: Abnormal   Collection Time: 12/06/18  7:57 AM   Specimen: Urine, Random  Result Value Ref Range   Specimen Description URINE, RANDOM    Special Requests      NONE Performed at [facility], [place] [address], [place], [place] [address]    Culture (A)     10,000 COLONIES/mL KLEBSIELLA PNEUMONIAE Confirmed Extended Spectrum Beta-Lactamase Producer (ESBL).  In bloodstream infections from ESBL organisms, carbapenems are preferred over piperacillin/tazobactam. They are shown to have a lower risk of mortality.    Report Status 12/10/2018 FINAL    Organism ID, Bacteria KLEBSIELLA PNEUMONIAE (A)       Susceptibility   Klebsiella pneumoniae - MIC*    AMPICILLIN >=32 RESISTANT Resistant     CEFAZOLIN >=64 RESISTANT Resistant  CEFTRIAXONE >=64 RESISTANT Resistant     CIPROFLOXACIN >=4 RESISTANT Resistant     GENTAMICIN >=16 RESISTANT Resistant     IMIPENEM <=0.25 SENSITIVE Sensitive     NITROFURANTOIN 128 RESISTANT Resistant     TRIMETH/SULFA 40 SENSITIVE Sensitive     AMPICILLIN/SULBACTAM >=32 RESISTANT Resistant     PIP/TAZO >=128 RESISTANT Resistant     Extended ESBL POSITIVE Resistant     * 10,000 COLONIES/mL KLEBSIELLA PNEUMONIAE  Glucose, capillary     Status:  Abnormal   Collection Time: 12/06/18  8:02 AM  Result Value Ref Range   Glucose-Capillary 174 (H) 70 - 99 mg/dL  Urinalysis, Routine w reflex microscopic     Status: Abnormal   Collection Time: 12/06/18 11:27 AM  Result Value Ref Range   Color, Urine YELLOW YELLOW   APPearance HAZY (A) CLEAR   Specific Gravity, Urine 1.019 1.005 - 1.030   pH 5.0 5.0 - 8.0   Glucose, UA NEGATIVE NEGATIVE mg/dL   Hgb urine dipstick SMALL (A) NEGATIVE   Bilirubin Urine NEGATIVE NEGATIVE   Ketones, ur 5 (A) NEGATIVE mg/dL   Protein, ur 30 (A) NEGATIVE mg/dL   Nitrite NEGATIVE NEGATIVE   Leukocytes,Ua TRACE (A) NEGATIVE   RBC / HPF 0-5 0 - 5 RBC/hpf   WBC, UA 6-10 0 - 5 WBC/hpf   Bacteria, UA MANY (A) NONE SEEN   Squamous Epithelial / LPF 0-5 0 - 5   Mucus PRESENT    Hyaline Casts, UA PRESENT     Comment: Performed at [facility], [address], [place], [place] [address]  Glucose, capillary     Status: Abnormal   Collection Time: 12/06/18 11:48 AM  Result Value Ref Range   Glucose-Capillary 213 (H) 70 - 99 mg/dL  Glucose, capillary     Status: None   Collection Time: 12/06/18  5:06 PM  Result Value Ref Range   Glucose-Capillary 89 70 - 99 mg/dL  Glucose, capillary     Status: Abnormal   Collection Time: 12/06/18  9:42 PM  Result Value Ref Range   Glucose-Capillary 229 (H) 70 - 99 mg/dL  CBC     Status: Abnormal   Collection Time: 12/07/18  2:51 AM  Result Value Ref Range   WBC 8.2 4.0 - 10.5 K/uL   RBC 3.29 (L) 3.87 - 5.11 MIL/uL   Hemoglobin 8.3 (L) 12.0 - 15.0 g/dL   HCT 28.2 (L) 36.0 - 46.0 %   MCV 85.7 80.0 - 100.0 fL   MCH 25.2 (L) 26.0 - 34.0 pg   MCHC 29.4 (L) 30.0 - 36.0 g/dL   RDW 15.8 (H) 11.5 - 15.5 %   Platelets 380 150 - 400 K/uL   nRBC 0.0 0.0 - 0.2 %    Comment: Performed at [facility], [place]. [address] [place], [place] [address]  Basic metabolic panel     Status: Abnormal   Collection Time: 12/07/18  2:51 AM  Result Value Ref Range   Sodium 140 135 -  145 mmol/L   Potassium 3.4 (L) 3.5 - 5.1 mmol/L   Chloride 101 98 - 111 mmol/L   CO2 26 22 - 32 mmol/L   Glucose, Bld 146 (H) 70 - 99 mg/dL   BUN 10 8 - 23 mg/dL   Creatinine, Ser 0.73 0.44 - 1.00 mg/dL   Calcium 8.8 (L) 8.9 - 10.3 mg/dL   GFR calc non Af Amer >60 >60 mL/min   GFR calc Af Amer >60 >  60 mL/min   Anion gap 13 5 - 15    Comment: Performed at [facility] [address], [place], [place] [address]  Glucose, capillary     Status: Abnormal   Collection Time: 12/07/18  7:52 AM  Result Value Ref Range   Glucose-Capillary 170 (H) 70 - 99 mg/dL  Glucose, capillary     Status: Abnormal   Collection Time: 12/07/18 10:47 AM  Result Value Ref Range   Glucose-Capillary 145 (H) 70 - 99 mg/dL  Urinalysis, Routine w reflex microscopic     Status: Abnormal   Collection Time: 01/10/19  3:46 PM  Result Value Ref Range   Color, Urine AMBER (A) YELLOW    Comment: BIOCHEMICALS MAY BE AFFECTED BY COLOR   APPearance CLOUDY (A) CLEAR   Specific Gravity, Urine 1.016 1.005 - 1.030   pH 5.0 5.0 - 8.0   Glucose, UA NEGATIVE NEGATIVE mg/dL   Hgb urine dipstick MODERATE (A) NEGATIVE   Bilirubin Urine NEGATIVE NEGATIVE   Ketones, ur NEGATIVE NEGATIVE mg/dL   Protein, ur 100 (A) NEGATIVE mg/dL   Nitrite NEGATIVE NEGATIVE   Leukocytes,Ua LARGE (A) NEGATIVE   RBC / HPF 6-10 0 - 5 RBC/hpf   WBC, UA >50 (H) 0 - 5 WBC/hpf   Bacteria, UA MANY (A) NONE SEEN   Squamous Epithelial / LPF 0-5 0 - 5   WBC Clumps PRESENT    Mucus PRESENT    Hyaline Casts, UA PRESENT     Comment: Performed at [facility], [address] [place], [place] [address]  Urine culture     Status: Abnormal   Collection Time: 01/10/19  3:46 PM   Specimen: Urine, Random  Result Value Ref Range   Specimen Description      URINE, RANDOM Performed at [facility], [address] [place], [place] [address]    Special Requests      NONE Performed at [facility], [address], [place],  [address]    Culture  (A)     >=100,000 COLONIES/mL ESCHERICHIA COLI 90,000 COLONIES/mL KLEBSIELLA PNEUMONIAE BOTH ORGANISMS Confirmed Extended Spectrum Beta-Lactamase Producer (ESBL).  In bloodstream infections from ESBL organisms, carbapenems are preferred over piperacillin/tazobactam. They are shown to have a lower risk of mortality.    Report Status 01/15/2019 FINAL    Organism ID, Bacteria ESCHERICHIA COLI (A)    Organism ID, Bacteria KLEBSIELLA PNEUMONIAE (A)       Susceptibility   Escherichia coli - MIC*    AMPICILLIN >=32 RESISTANT Resistant     CEFAZOLIN >=64 RESISTANT Resistant     CEFTRIAXONE >=64 RESISTANT Resistant     CIPROFLOXACIN >=4 RESISTANT Resistant     GENTAMICIN <=1 SENSITIVE Sensitive     IMIPENEM <=0.25 SENSITIVE Sensitive     NITROFURANTOIN 64 INTERMEDIATE Intermediate     TRIMETH/SULFA >=320 RESISTANT Resistant     AMPICILLIN/SULBACTAM >=32 RESISTANT Resistant     PIP/TAZO 8 SENSITIVE Sensitive     Extended ESBL POSITIVE Resistant     * >=100,000 COLONIES/mL ESCHERICHIA COLI   Klebsiella pneumoniae - MIC*    AMPICILLIN >=32 RESISTANT Resistant     CEFAZOLIN >=64 RESISTANT Resistant     CEFTRIAXONE >=64 RESISTANT Resistant     CIPROFLOXACIN >=4 RESISTANT Resistant     GENTAMICIN >=16 RESISTANT Resistant     IMIPENEM <=0.25 SENSITIVE Sensitive     NITROFURANTOIN 256 RESISTANT Resistant     TRIMETH/SULFA >=320 RESISTANT Resistant     AMPICILLIN/SULBACTAM >=32 RESISTANT Resistant  PIP/TAZO >=128 RESISTANT Resistant     Extended ESBL POSITIVE Resistant     * 90,000 COLONIES/mL KLEBSIELLA PNEUMONIAE  Carbapenem Resistance Panel     Status: None   Collection Time: 01/10/19  3:46 PM  Result Value Ref Range   Carba Resistance IMP Gene NOT DETECTED NOT DETECTED   Carba Resistance VIM Gene NOT DETECTED NOT DETECTED   Carba Resistance NDM Gene NOT DETECTED NOT DETECTED   Carba Resistance KPC Gene NOT DETECTED NOT DETECTED   Carba Resistance OXA48 Gene NOT DETECTED NOT DETECTED     Comment: (NOTE) Cepheid Carba-R is an FDA-cleared nucleic acid amplification test  (NAAT)for the detection and differentiation of genes encoding the  most prevalent carbapenemases in bacterial isolate samples. Carbapenemase gene identification and implementation of comprehensive  infection control measures are recommended by the CDC to prevent the  spread of the resistant organisms. Performed at [facility], [place] [address], [place], [place] [address]   SARS Coronavirus 2 (CEPHEID - Performed in [place] hospital lab), Hosp Order     Status: None   Collection Time: 01/10/19  3:49 PM   Specimen: Nasopharyngeal Swab  Result Value Ref Range   SARS Coronavirus 2 NEGATIVE NEGATIVE    Comment: (NOTE) If result is NEGATIVE SARS-CoV-2 target nucleic acids are NOT DETECTED. The SARS-CoV-2 RNA is generally detectable in upper and lower  respiratory specimens during the acute phase of infection. The lowest  concentration of SARS-CoV-2 viral copies this assay can detect is 250  copies / mL. A negative result does not preclude SARS-CoV-2 infection  and should not be used as the sole basis for treatment or other  patient management decisions.  A negative result may occur with  improper specimen collection / handling, submission of specimen other  than nasopharyngeal swab, presence of viral mutation(s) within the  areas targeted by this assay, and inadequate number of viral copies  (<250 copies / mL). A negative result must be combined with clinical  observations, patient history, and epidemiological information. If result is POSITIVE SARS-CoV-2 target nucleic acids are DETECTED. The SARS-CoV-2 RNA is generally detectable in upper and lower  respiratory specimens dur ing the acute phase of infection.  Positive  results are indicative of active infection with SARS-CoV-2.  Clinical  correlation with patient history and other diagnostic information is  necessary to determine patient  infection status.  Positive results do  not rule out bacterial infection or co-infection with other viruses. If result is PRESUMPTIVE POSTIVE SARS-CoV-2 nucleic acids MAY BE PRESENT.   A presumptive positive result was obtained on the submitted specimen  and confirmed on repeat testing.  While 2019 novel coronavirus  (SARS-CoV-2) nucleic acids may be present in the submitted sample  additional confirmatory testing may be necessary for epidemiological  and / or clinical management purposes  to differentiate between  SARS-CoV-2 and other Sarbecovirus currently known to infect humans.  If clinically indicated additional testing with an alternate test  methodology [phone]) is advised. The SARS-CoV-2 RNA is generally  detectable in upper and lower respiratory sp ecimens during the acute  phase of infection. The expected result is Negative. Fact Sheet for Patients:  StrictlyIdeas.no Fact Sheet for Healthcare Providers: [email or web address] This test is not yet approved or cleared by the [place] FDA and has been authorized for detection and/or diagnosis of SARS-CoV-2 by FDA under an Emergency Use Authorization (EUA).  This EUA will remain in effect (meaning this test can be used) for the duration  of the COVID-19 declaration under Section 564(b)(1) of the Act, 21 U.S.C. section 360bbb-3(b)(1), unless the authorization is terminated or revoked sooner. Performed at [facility], [address], [place], [place] [address]   Comprehensive metabolic panel     Status: Abnormal   Collection Time: 01/10/19  4:46 PM  Result Value Ref Range   Sodium 134 (L) 135 - 145 mmol/L   Potassium 4.8 3.5 - 5.1 mmol/L   Chloride 98 98 - 111 mmol/L   CO2 16 (L) 22 - 32 mmol/L   Glucose, Bld 331 (H) 70 - 99 mg/dL   BUN 28 (H) 8 - 23 mg/dL   Creatinine, Ser 1.56 (H) 0.44 - 1.00 mg/dL   Calcium 8.8 (L) 8.9 - 10.3 mg/dL   Total Protein 6.7 6.5 - 8.1 g/dL    Albumin 3.1 (L) 3.5 - 5.0 g/dL   AST 31 15 - 41 U/L   ALT 18 0 - 44 U/L   Alkaline Phosphatase 128 (H) 38 - 126 U/L   Total Bilirubin 1.6 (H) 0.3 - 1.2 mg/dL   GFR calc non Af Amer 34 (L) >60 mL/min   GFR calc Af Amer 39 (L) >60 mL/min   Anion gap 20 (H) 5 - 15    Comment: Performed at [facility], [address] [place], [place] [address]  Lipase, blood     Status: None   Collection Time: 01/10/19  4:46 PM  Result Value Ref Range   Lipase 17 11 - 51 U/L    Comment: Performed at [facility], [address], [place], [place] [address]  Brain natriuretic peptide     Status: Abnormal   Collection Time: 01/10/19  4:46 PM  Result Value Ref Range   B Natriuretic Peptide 2,107.0 (H) 0.0 - 100.0 pg/mL    Comment: Performed at [facility], [address] [place],  [address]  Troponin I - Once     Status: Abnormal   Collection Time: 01/10/19  4:46 PM  Result Value Ref Range   Troponin I 0.29 (HH) <0.03 ng/mL    Comment: CRITICAL RESULT CALLED TO, READ BACK BY AND VERIFIED WITH: CRAWFORD,H ON 01/10/19 AT 1730 BY LOY,C Performed at [facility], [address] [place], [place] [address]   Lactic acid, plasma     Status: Abnormal   Collection Time: 01/10/19  4:46 PM  Result Value Ref Range   Lactic Acid, Venous 6.2 (HH) 0.5 - 1.9 mmol/L    Comment: CRITICAL RESULT CALLED TO, READ BACK BY AND VERIFIED WITH: CRAWFORD,H ON 01/10/19 AT 1730 BY LOY,C Performed at [facility], [address], [place], [place] [address]   CBC with Differential     Status: Abnormal   Collection Time: 01/10/19  4:46 PM  Result Value Ref Range   WBC 10.4 4.0 - 10.5 K/uL   RBC 3.08 (L) 3.87 - 5.11 MIL/uL   Hemoglobin 7.8 (L) 12.0 - 15.0 g/dL   HCT 27.4 (L) 36.0 - 46.0 %   MCV 89.0 80.0 - 100.0 fL   MCH 25.3 (L) 26.0 - 34.0 pg   MCHC 28.5 (L) 30.0 - 36.0 g/dL   RDW 15.6 (H) 11.5 - 15.5 %   Platelets 346 150 - 400 K/uL   nRBC 0.3 (H) 0.0 - 0.2 %   Neutrophils Relative % 84 %   Neutro Abs 8.6 (H) 1.7 -  7.7 K/uL   Lymphocytes Relative 7 %   Lymphs Abs 0.7 0.7 - 4.0 K/uL  Monocytes Relative 8 %   Monocytes Absolute 0.8 0.1 - 1.0 K/uL   Eosinophils Relative 0 %   Eosinophils Absolute 0.0 0.0 - 0.5 K/uL   Basophils Relative 0 %   Basophils Absolute 0.0 0.0 - 0.1 K/uL   Immature Granulocytes 1 %   Abs Immature Granulocytes 0.12 (H) 0.00 - 0.07 K/uL    Comment: Performed at [facility], [address], [place], [place] [address]  Magnesium     Status: Abnormal   Collection Time: 01/10/19  4:46 PM  Result Value Ref Range   Magnesium 1.6 (L) 1.7 - 2.4 mg/dL    Comment: Performed at [facility], [address], [place], [place] [address]  Lactic acid, plasma     Status: Abnormal   Collection Time: 01/10/19  6:34 PM  Result Value Ref Range   Lactic Acid, Venous 2.9 (HH) 0.5 - 1.9 mmol/L    Comment: CRITICAL RESULT CALLED TO, READ BACK BY AND VERIFIED WITH: NICHOLS,K ON 01/10/19 AT 1920 BY LOY,C Performed at [facility], [address] [place], [place] [address]   Troponin I - Once     Status: Abnormal   Collection Time: 01/10/19  8:20 PM  Result Value Ref Range   Troponin I 0.25 (HH) <0.03 ng/mL    Comment: CRITICAL VALUE NOTED.  VALUE IS CONSISTENT WITH PREVIOUSLY REPORTED AND CALLED VALUE. Performed at [facility], [address], [place], [place] [address]   Basic metabolic panel     Status: Abnormal   Collection Time: 01/10/19  8:20 PM  Result Value Ref Range   Sodium 133 (L) 135 - 145 mmol/L   Potassium 4.4 3.5 - 5.1 mmol/L   Chloride 98 98 - 111 mmol/L   CO2 20 (L) 22 - 32 mmol/L   Glucose, Bld 295 (H) 70 - 99 mg/dL   BUN 28 (H) 8 - 23 mg/dL   Creatinine, Ser 1.55 (H) 0.44 - 1.00 mg/dL   Calcium 8.4 (L) 8.9 - 10.3 mg/dL   GFR calc non Af Amer 34 (L) >60 mL/min   GFR calc Af Amer 39 (L) >60 mL/min   Anion gap 15 5 - 15    Comment: Performed at [facility], [address], [place], [place] [address]  Blood gas, venous     Status: Abnormal   Collection Time: 01/10/19  9:19  PM  Result Value Ref Range   FIO2 21.00    pH, Ven 7.376 7.250 - 7.430   pCO2, Ven 39.6 (L) 44.0 - 60.0 mmHg   pO2, Ven 48.2 (H) 32.0 - 45.0 mmHg   Bicarbonate 22.6 20.0 - 28.0 mmol/L   Acid-base deficit 1.7 0.0 - 2.0 mmol/L   O2 Saturation 76.6 %   Patient temperature 36.4     Comment: Performed at [facility], [address] [place], [place] [address]  CBG monitoring, ED     Status: Abnormal   Collection Time: 01/10/19  9:29 PM  Result Value Ref Range   Glucose-Capillary 278 (H) 70 - 99 mg/dL  Glucose, capillary     Status: Abnormal   Collection Time: 01/11/19 12:09 AM  Result Value Ref Range   Glucose-Capillary 266 (H) 70 - 99 mg/dL  Glucose, capillary     Status: Abnormal   Collection Time: 01/11/19  8:19 AM  Result Value Ref Range   Glucose-Capillary 132 (H) 70 - 99 mg/dL   Comment 1 Notify RN    Comment 2 Document in Chart   Heparin level (unfractionated)  Status: Abnormal   Collection Time: 01/11/19  9:04 AM  Result Value Ref Range   Heparin Unfractionated >2.20 (H) 0.30 - 0.70 IU/mL    Comment: RESULTS CONFIRMED BY MANUAL DILUTION Performed at [facility], [place] [address], [place], [place] [address]   APTT     Status: Abnormal   Collection Time: 01/11/19  9:04 AM  Result Value Ref Range   aPTT 44 (H) 24 - 36 seconds    Comment:        IF BASELINE aPTT IS ELEVATED, SUGGEST PATIENT RISK ASSESSMENT BE USED TO DETERMINE APPROPRIATE ANTICOAGULANT THERAPY. Performed at [facility], [place] [address] [place], [place] [address]   ECHOCARDIOGRAM COMPLETE     Status: None   Collection Time: 01/11/19 10:09 AM  Result Value Ref Range   Weight 3,788.38 oz   Height 65 in   BP 135/98 mmHg  Basic metabolic panel     Status: Abnormal   Collection Time: 01/11/19 11:06 AM  Result Value Ref Range   Sodium 135 135 - 145 mmol/L   Potassium 4.2 3.5 - 5.1 mmol/L   Chloride 101 98 - 111 mmol/L   CO2 23 22 - 32 mmol/L   Glucose, Bld 165 (H) 70 - 99 mg/dL   BUN 28  (H) 8 - 23 mg/dL   Creatinine, Ser 1.68 (H) 0.44 - 1.00 mg/dL   Calcium 8.5 (L) 8.9 - 10.3 mg/dL   GFR calc non Af Amer 31 (L) >60 mL/min   GFR calc Af Amer 36 (L) >60 mL/min   Anion gap 11 5 - 15    Comment: Performed at [facility] [address] [place], [place] [address]  CBC     Status: Abnormal   Collection Time: 01/11/19 11:06 AM  Result Value Ref Range   WBC 12.8 (H) 4.0 - 10.5 K/uL   RBC 3.05 (L) 3.87 - 5.11 MIL/uL   Hemoglobin 7.4 (L) 12.0 - 15.0 g/dL   HCT 25.4 (L) 36.0 - 46.0 %   MCV 83.3 80.0 - 100.0 fL   MCH 24.3 (L) 26.0 - 34.0 pg   MCHC 29.1 (L) 30.0 - 36.0 g/dL   RDW 15.5 11.5 - 15.5 %   Platelets 311 150 - 400 K/uL   nRBC 1.1 (H) 0.0 - 0.2 %    Comment: Performed at [place] [address], [place], [place] [address]  Magnesium     Status: None   Collection Time: 01/11/19 11:06 AM  Result Value Ref Range   Magnesium 1.7 1.7 - 2.4 mg/dL    Comment: Performed at [facility] [address] [place], [place] [address]  Glucose, capillary     Status: Abnormal   Collection Time: 01/11/19 12:10 PM  Result Value Ref Range   Glucose-Capillary 144 (H) 70 - 99 mg/dL   Comment 1 Notify RN    Comment 2 Document in Chart   Lactic acid, plasma     Status: None   Collection Time: 01/11/19  1:49 PM  Result Value Ref Range   Lactic Acid, Venous 1.7 0.5 - 1.9 mmol/L    Comment: Performed at [facility] [name], [place], [place] [address]  Glucose, capillary     Status: Abnormal   Collection Time: 01/11/19  3:37 PM  Result Value Ref Range   Glucose-Capillary 134 (H) 70 - 99 mg/dL  Basic metabolic panel     Status: Abnormal  Collection Time: 01/11/19  8:53 PM  Result Value Ref Range   Sodium 131 (L) 135 - 145 mmol/L   Potassium 4.2 3.5 - 5.1 mmol/L   Chloride 98 98 - 111 mmol/L   CO2 25 22 - 32 mmol/L   Glucose, Bld 115 (H) 70 - 99 mg/dL   BUN 30 (H) 8 - 23 mg/dL   Creatinine, Ser 2.00 (H) 0.44 - 1.00 mg/dL   Calcium 8.0 (L)  8.9 - 10.3 mg/dL   GFR calc non Af Amer 25 (L) >60 mL/min   GFR calc Af Amer 29 (L) >60 mL/min   Anion gap 8 5 - 15    Comment: Performed at [facility] [address], [place], [place] [address]  Glucose, capillary     Status: Abnormal   Collection Time: 01/11/19 10:25 PM  Result Value Ref Range   Glucose-Capillary 100 (H) 70 - 99 mg/dL  Basic metabolic panel     Status: Abnormal   Collection Time: 01/12/19  3:24 AM  Result Value Ref Range   Sodium 136 135 - 145 mmol/L   Potassium 4.3 3.5 - 5.1 mmol/L   Chloride 102 98 - 111 mmol/L   CO2 25 22 - 32 mmol/L   Glucose, Bld 92 70 - 99 mg/dL   BUN 31 (H) 8 - 23 mg/dL   Creatinine, Ser 2.06 (H) 0.44 - 1.00 mg/dL   Calcium 8.3 (L) 8.9 - 10.3 mg/dL   GFR calc non Af Amer 24 (L) >60 mL/min   GFR calc Af Amer 28 (L) >60 mL/min   Anion gap 9 5 - 15    Comment: Performed at [facility], [place] [address], [place], [place] [address]  Glucose, capillary     Status: None   Collection Time: 01/12/19  7:50 AM  Result Value Ref Range   Glucose-Capillary 72 70 - 99 mg/dL  CBC     Status: Abnormal   Collection Time: 01/12/19  9:39 AM  Result Value Ref Range   WBC 10.2 4.0 - 10.5 K/uL   RBC 3.22 (L) 3.87 - 5.11 MIL/uL   Hemoglobin 8.0 (L) 12.0 - 15.0 g/dL   HCT 27.3 (L) 36.0 - 46.0 %   MCV 84.8 80.0 - 100.0 fL   MCH 24.8 (L) 26.0 - 34.0 pg   MCHC 29.3 (L) 30.0 - 36.0 g/dL   RDW 15.4 11.5 - 15.5 %   Platelets 362 150 - 400 K/uL   nRBC 2.6 (H) 0.0 - 0.2 %    Comment: Performed at [facility], [place] [address], [place], [place] [address]  Vitamin B12     Status: None   Collection Time: 01/12/19  9:39 AM  Result Value Ref Range   Vitamin B-12 343 180 - 914 pg/mL    Comment: (NOTE) This assay is not validated for testing neonatal or myeloproliferative syndrome specimens for Vitamin B12 levels. Performed at [name], [place] [address], [place], [place] [address]   Folate     Status: None   Collection Time: 01/12/19   9:39 AM  Result Value Ref Range   Folate 10.6 >5.9 ng/mL    Comment: Performed at [facility] [address] [place], [place] [address]  Iron and TIBC     Status: Abnormal   Collection Time: 01/12/19  9:39 AM  Result Value Ref Range   Iron 11 (L) 28 - 170 ug/dL   TIBC 343 250 -  450 ug/dL   Saturation Ratios 3 (L) 10.4 - 31.8 %   UIBC 332 ug/dL    Comment: Performed at [facility], [place] [address], [place], [place] [address]  Ferritin     Status: None   Collection Time: 01/12/19  9:39 AM  Result Value Ref Range   Ferritin 33 11 - 307 ng/mL    Comment: Performed at [facility], [place] [address] [place], [place] [address]  Reticulocytes     Status: Abnormal   Collection Time: 01/12/19  9:39 AM  Result Value Ref Range   Retic Ct Pct 2.4 0.4 - 3.1 %   RBC. 3.22 (L) 3.87 - 5.11 MIL/uL   Retic Count, Absolute 76.6 19.0 - 186.0 K/uL   Immature Retic Fract 31.0 (H) 2.3 - 15.9 %    Comment: Performed at [facility] [address], [place], [place] [address]  Glucose, capillary     Status: Abnormal   Collection Time: 01/12/19 12:18 PM  Result Value Ref Range   Glucose-Capillary 103 (H) 70 - 99 mg/dL  Glucose, capillary     Status: Abnormal   Collection Time: 01/12/19  4:14 PM  Result Value Ref Range   Glucose-Capillary 174 (H) 70 - 99 mg/dL  Glucose, capillary     Status: Abnormal   Collection Time: 01/12/19 10:30 PM  Result Value Ref Range   Glucose-Capillary 209 (H) 70 - 99 mg/dL  CBC     Status: Abnormal   Collection Time: 01/13/19  7:50 AM  Result Value Ref Range   WBC 10.2 4.0 - 10.5 K/uL   RBC 3.25 (L) 3.87 - 5.11 MIL/uL   Hemoglobin 7.9 (L) 12.0 - 15.0 g/dL   HCT 27.0 (L) 36.0 - 46.0 %   MCV 83.1 80.0 - 100.0 fL   MCH 24.3 (L) 26.0 - 34.0 pg   MCHC 29.3 (L) 30.0 - 36.0 g/dL   RDW 15.5 11.5 - 15.5 %   Platelets 379 150 - 400 K/uL   nRBC 1.8 (H) 0.0 - 0.2 %    Comment: Performed at [facility], [place] [address] [place], [place] [address]   Basic metabolic panel     Status: Abnormal   Collection Time: 01/13/19  7:50 AM  Result Value Ref Range   Sodium 139 135 - 145 mmol/L   Potassium 4.4 3.5 - 5.1 mmol/L   Chloride 101 98 - 111 mmol/L   CO2 34 (H) 22 - 32 mmol/L   Glucose, Bld 120 (H) 70 - 99 mg/dL   BUN 30 (H) 8 - 23 mg/dL   Creatinine, Ser 1.47 (H) 0.44 - 1.00 mg/dL   Calcium 8.3 (L) 8.9 - 10.3 mg/dL   GFR calc non Af Amer 36 (L) >60 mL/min   GFR calc Af Amer 42 (L) >60 mL/min   Anion gap 4 (L) 5 - 15    Comment: Performed at [facility], [place] [address] [place], [place] [address]  Glucose, capillary     Status: Abnormal   Collection Time: 01/13/19  8:16 AM  Result Value Ref Range   Glucose-Capillary 115 (H) 70 - 99 mg/dL  Glucose, capillary     Status: Abnormal   Collection Time: 01/13/19 11:51 AM  Result Value Ref Range   Glucose-Capillary 211 (H) 70 - 99 mg/dL  Glucose, capillary     Status: Abnormal   Collection Time: 01/13/19  5:27 PM  Result Value Ref Range  Glucose-Capillary 148 (H) 70 - 99 mg/dL  Glucose, capillary     Status: Abnormal   Collection Time: 01/13/19 10:02 PM  Result Value Ref Range   Glucose-Capillary 163 (H) 70 - 99 mg/dL  Basic metabolic panel     Status: Abnormal   Collection Time: 01/14/19  6:44 AM  Result Value Ref Range   Sodium 138 135 - 145 mmol/L   Potassium 4.3 3.5 - 5.1 mmol/L   Chloride 98 98 - 111 mmol/L   CO2 29 22 - 32 mmol/L   Glucose, Bld 169 (H) 70 - 99 mg/dL   BUN 25 (H) 8 - 23 mg/dL   Creatinine, Ser 1.18 (H) 0.44 - 1.00 mg/dL   Calcium 8.7 (L) 8.9 - 10.3 mg/dL   GFR calc non Af Amer 47 (L) >60 mL/min   GFR calc Af Amer 55 (L) >60 mL/min   Anion gap 11 5 - 15    Comment: Performed at [facility] [address] [place], [place] [address]  CBC     Status: Abnormal   Collection Time: 01/14/19  6:44 AM  Result Value Ref Range   WBC 10.8 (H) 4.0 - 10.5 K/uL   RBC 3.24 (L) 3.87 - 5.11 MIL/uL   Hemoglobin 8.0 (L) 12.0 - 15.0 g/dL   HCT 27.5 (L)  36.0 - 46.0 %   MCV 84.9 80.0 - 100.0 fL   MCH 24.7 (L) 26.0 - 34.0 pg   MCHC 29.1 (L) 30.0 - 36.0 g/dL   RDW 15.5 11.5 - 15.5 %   Platelets 393 150 - 400 K/uL   nRBC 1.6 (H) 0.0 - 0.2 %    Comment: Performed at [facility], [place] [address] [place], [place] [address]  Glucose, capillary     Status: Abnormal   Collection Time: 01/14/19  8:05 AM  Result Value Ref Range   Glucose-Capillary 143 (H) 70 - 99 mg/dL  Glucose, capillary     Status: Abnormal   Collection Time: 01/14/19 11:35 AM  Result Value Ref Range   Glucose-Capillary 151 (H) 70 - 99 mg/dL  Glucose, capillary     Status: Abnormal   Collection Time: 01/14/19  4:52 PM  Result Value Ref Range   Glucose-Capillary 148 (H) 70 - 99 mg/dL  Glucose, capillary     Status: Abnormal   Collection Time: 01/14/19  9:19 PM  Result Value Ref Range   Glucose-Capillary 115 (H) 70 - 99 mg/dL  Glucose, capillary     Status: None   Collection Time: 01/15/19  7:49 AM  Result Value Ref Range   Glucose-Capillary 97 70 - 99 mg/dL  CBC     Status: Abnormal   Collection Time: 01/15/19  8:08 AM  Result Value Ref Range   WBC 11.9 (H) 4.0 - 10.5 K/uL   RBC 3.36 (L) 3.87 - 5.11 MIL/uL   Hemoglobin 8.5 (L) 12.0 - 15.0 g/dL   HCT 28.5 (L) 36.0 - 46.0 %   MCV 84.8 80.0 - 100.0 fL   MCH 25.3 (L) 26.0 - 34.0 pg   MCHC 29.8 (L) 30.0 - 36.0 g/dL   RDW 16.1 (H) 11.5 - 15.5 %   Platelets 465 (H) 150 - 400 K/uL   nRBC 0.8 (H) 0.0 - 0.2 %    Comment: Performed at [facility], [place] [address] [place], [place] [address]  Glucose, capillary     Status: Abnormal   Collection Time: 01/15/19 11:17 AM  Result  Value Ref Range   Glucose-Capillary 172 (H) 70 - 99 mg/dL  Glucose, capillary     Status: Abnormal   Collection Time: 01/15/19  4:33 PM  Result Value Ref Range   Glucose-Capillary 152 (H) 70 - 99 mg/dL  Glucose, capillary     Status: None   Collection Time: 01/15/19  9:15 PM  Result Value Ref Range   Glucose-Capillary 93 70 - 99 mg/dL   CBC     Status: Abnormal   Collection Time: 01/16/19  5:47 AM  Result Value Ref Range   WBC 10.4 4.0 - 10.5 K/uL   RBC 3.12 (L) 3.87 - 5.11 MIL/uL   Hemoglobin 7.8 (L) 12.0 - 15.0 g/dL   HCT 27.1 (L) 36.0 - 46.0 %   MCV 86.9 80.0 - 100.0 fL   MCH 25.0 (L) 26.0 - 34.0 pg   MCHC 28.8 (L) 30.0 - 36.0 g/dL   RDW 16.1 (H) 11.5 - 15.5 %   Platelets 419 (H) 150 - 400 K/uL   nRBC 0.2 0.0 - 0.2 %    Comment: Performed at [facility], [place] [address] [place], [place] [address]  Basic metabolic panel     Status: Abnormal   Collection Time: 01/16/19  5:47 AM  Result Value Ref Range   Sodium 140 135 - 145 mmol/L   Potassium 4.3 3.5 - 5.1 mmol/L   Chloride 99 98 - 111 mmol/L   CO2 34 (H) 22 - 32 mmol/L   Glucose, Bld 105 (H) 70 - 99 mg/dL   BUN 22 8 - 23 mg/dL   Creatinine, Ser 0.91 0.44 - 1.00 mg/dL   Calcium 8.5 (L) 8.9 - 10.3 mg/dL   GFR calc non Af Amer >60 >60 mL/min   GFR calc Af Amer >60 >60 mL/min   Anion gap 7 5 - 15    Comment: Performed at [facility], [place] [address], [place], [place] [address]  Glucose, capillary     Status: None   Collection Time: 01/16/19  7:45 AM  Result Value Ref Range   Glucose-Capillary 77 70 - 99 mg/dL  Glucose, capillary     Status: Abnormal   Collection Time: 01/16/19 12:25 PM  Result Value Ref Range   Glucose-Capillary 136 (H) 70 - 99 mg/dL  Kappa/lambda light chains     Status: Abnormal   Collection Time: 01/25/19  1:54 PM  Result Value Ref Range   Kappa free light chain 45.7 (H) 3.3 - 19.4 mg/L   Lamda free light chains 33.2 (H) 5.7 - 26.3 mg/L   Kappa, lamda light chain ratio 1.38 0.26 - 1.65    Comment: (NOTE) Performed At: [facility] [place], [place] [id number] Rush Farmer MD [id number]   Multiple Myeloma Panel (SPEP&IFE w/QIG)     Status: None   Collection Time: 01/25/19  1:54 PM  Result Value Ref Range   IgG (Immunoglobin G), Serum 1,167 586 - 1,602 mg/dL   IgA 256 87 - 352 mg/dL   IgM  (Immunoglobulin M), Srm 77 26 - 217 mg/dL   Total Protein ELP 6.1 6.0 - 8.5 g/dL   Albumin SerPl Elph-Mcnc 3.0 2.9 - 4.4 g/dL   Alpha 1 0.3 0.0 - 0.4 g/dL   Alpha2 Glob SerPl Elph-Mcnc 1.0 0.4 - 1.0 g/dL   B-Globulin SerPl Elph-Mcnc 0.8 0.7 - 1.3 g/dL   Gamma Glob SerPl Elph-Mcnc 1.0 0.4 - 1.8 g/dL   M Protein SerPl Elph-Mcnc Not Observed Not  Observed g/dL   Globulin, Total 3.1 2.2 - 3.9 g/dL   Albumin/Glob SerPl 1.0 0.7 - 1.7   IFE 1 Comment     Comment: (NOTE) The immunofixation pattern appears unremarkable. Evidence of monoclonal protein is not apparent.    Please Note Comment     Comment: (NOTE) Protein electrophoresis scan will follow via computer, mail, or courier delivery. Performed At: [facility] [place], [place] [id number] Rush Farmer MD OV:[id number]   Vitamin B12     Status: None   Collection Time: 01/25/19  1:54 PM  Result Value Ref Range   Vitamin B-12 299 180 - 914 pg/mL    Comment: (NOTE) This assay is not validated for testing neonatal or myeloproliferative syndrome specimens for Vitamin B12 levels. Performed at [facility], [place] [address], [place], [place] [address]   Erythropoietin     Status: Abnormal   Collection Time: 01/25/19  1:54 PM  Result Value Ref Range   Erythropoietin 53.6 (H) 2.6 - 18.5 mIU/mL    Comment: (NOTE) [employer] Uni[employer] DxI 800 Immunoassay System Values obtained with different assay methods or kits cannot be used interchangeably. Results cannot be interpreted as absolute evidence of the presence or absence of malignant disease. Performed At: [facility] [place], [place] [id number] Rush Farmer MD [id number]   [place] ([place] only)     Status: Abnormal   Collection Time: 01/25/19  1:54 PM  Result Value Ref Range   Sodium 140 135 - 145 mmol/L   Potassium 3.7 3.5 - 5.1 mmol/L   Chloride 103 98 - 111 mmol/L   CO2 27 22 - 32 mmol/L   Glucose, Bld  147 (H) 70 - 99 mg/dL   BUN 13 8 - 23 mg/dL   Creatinine 0.81 0.44 - 1.00 mg/dL   Calcium 8.8 (L) 8.9 - 10.3 mg/dL   Total Protein 6.8 6.5 - 8.1 g/dL   Albumin 3.0 (L) 3.5 - 5.0 g/dL   AST 7 (L) 15 - 41 U/L   ALT <6 0 - 44 U/L   Alkaline Phosphatase 91 38 - 126 U/L   Total Bilirubin 0.7 0.3 - 1.2 mg/dL   GFR, Est Non Af Am >60 >60 mL/min   GFR, Est AFR Am >60 >60 mL/min   Anion gap 10 5 - 15    Comment: Performed at [facility], [address], [place], [place] [address]  CBC with Differential/Platelet     Status: Abnormal   Collection Time: 01/25/19  1:54 PM  Result Value Ref Range   WBC 8.2 4.0 - 10.5 K/uL   RBC 3.15 (L) 3.87 - 5.11 MIL/uL   Hemoglobin 8.0 (L) 12.0 - 15.0 g/dL   HCT 28.2 (L) 36.0 - 46.0 %   MCV 89.5 80.0 - 100.0 fL   MCH 25.4 (L) 26.0 - 34.0 pg   MCHC 28.4 (L) 30.0 - 36.0 g/dL   RDW 18.4 (H) 11.5 - 15.5 %   Platelets 311 150 - 400 K/uL   nRBC 0.0 0.0 - 0.2 %   Neutrophils Relative % 78 %   Neutro Abs 6.5 1.7 - 7.7 K/uL   Lymphocytes Relative 12 %   Lymphs Abs 1.0 0.7 - 4.0 K/uL   Monocytes Relative 5 %   Monocytes Absolute 0.4 0.1 - 1.0 K/uL   Eosinophils Relative 3 %   Eosinophils Absolute 0.2 0.0 - 0.5 K/uL   Basophils Relative 1 %  Basophils Absolute 0.1 0.0 - 0.1 K/uL   Immature Granulocytes 1 %   Abs Immature Granulocytes 0.05 0.00 - 0.07 K/uL    Comment: Performed at [facility], [address], [place], [place] [address]  CBC with Differential     Status: Abnormal   Collection Time: 02/10/19  2:15 PM  Result Value Ref Range   WBC 9.1 4.0 - 10.5 K/uL   RBC 3.43 (L) 3.87 - 5.11 MIL/uL   Hemoglobin 8.9 (L) 12.0 - 15.0 g/dL   HCT 31.3 (L) 36.0 - 46.0 %   MCV 91.3 80.0 - 100.0 fL   MCH 25.9 (L) 26.0 - 34.0 pg   MCHC 28.4 (L) 30.0 - 36.0 g/dL   RDW 19.5 (H) 11.5 - 15.5 %   Platelets 309 150 - 400 K/uL   nRBC 0.0 0.0 - 0.2 %   Neutrophils Relative % 77 %   Neutro Abs 7.1 1.7 - 7.7 K/uL    Lymphocytes Relative 13 %   Lymphs Abs 1.2 0.7 - 4.0 K/uL   Monocytes Relative 7 %   Monocytes Absolute 0.6 0.1 - 1.0 K/uL   Eosinophils Relative 2 %   Eosinophils Absolute 0.2 0.0 - 0.5 K/uL   Basophils Relative 0 %   Basophils Absolute 0.0 0.0 - 0.1 K/uL   Immature Granulocytes 1 %   Abs Immature Granulocytes 0.05 0.00 - 0.07 K/uL    Comment: Performed at [facility], [address] [place], [place] [address]  Reticulocytes     Status: Abnormal   Collection Time: 02/10/19  2:15 PM  Result Value Ref Range   Retic Ct Pct 2.8 0.4 - 3.1 %   RBC. 3.43 (L) 3.87 - 5.11 MIL/uL   Retic Count, Absolute 97.1 19.0 - 186.0 K/uL   Immature Retic Fract 14.4 2.3 - 15.9 %    Comment: Performed at [facility], [address] [place], [place] [address]  Lactate dehydrogenase     Status: Abnormal   Collection Time: 02/10/19  2:15 PM  Result Value Ref Range   LDH 204 (H) 98 - 192 U/L    Comment: Performed at [facility], [address] [place], [place] [address]  Folate     Status: None   Collection Time: 02/10/19  2:22 PM  Result Value Ref Range   Folate 9.8 >5.9 ng/mL    Comment: Performed at [facility], [address] [place], [place] [address]    RADIOGRAPHIC STUDIES: I have personally reviewed the radiological images as listed and agreed with the findings in the report.  ASSESSMENT & PLAN:  Iron deficiency anemia 1.  Normocytic anemia: - EGD on 09/06/2018 shows mild gastritis.  Moderate Schatzki ring.  This was done when she had a rectal bleeding.  She never had colonoscopy. 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  There is also component of chronic kidney disease. -Iron panel on 01/12/2019 shows ferritin of 33, percent saturation of 3.  Folic acid was normal. - Received Feraheme on 01/13/2019 and Injectafer on 02/01/2019. - Has been taking ferrous sulfate 325 mg twice daily for last 2 to 3 weeks. - Denies any active bleeding per rectum at this  time.  Continues to feel tired. - I reviewed blood work from 01/25/2019.  SPEP was negative for M spike.  Hemoglobin was 8 and MCV of 89.5.  Serum erythropoietin was 53.6.  Free light chain ratio was 1.38 with kappa light chains of  45.7 and lambda light chains of 33.2.  B12 was borderline 299. - I have recommended checking CBC today.  We will also check LDH, reticulocyte count, copper level.  Will send for methylmalonic acid. -I will give her 1 more infusion of Feraheme.  We will schedule her for follow-up visit in a month with repeat iron panel ferritin and CBC. - She was encouraged to follow-up with GI for colonoscopy.  2.  Atrial fibrillation: -She is on Eliquis for a long time.   Total time spent is 45 minutes with more than 50% of the time spent face-to-face discussing treatment plan and counseling and coordination of care.  All questions were answered. The patient knows to call the clinic with any problems, questions or concerns.     Derek Jack, MD 02/10/19 5:55 PM

## 2019-02-10 NOTE — Patient Outreach (Signed)
[place] [facility]) Care Management  02/10/2019  Kim M Upshur 19-Jan-1951 [id number]    Attempted follow-up outreach with Mrs. Fehrenbach. Left voice message requesting a return call when she is available.    PLAN -Will follow-up next week.   [place] [phone]

## 2019-02-11 DIAGNOSIS — L03311 Cellulitis of abdominal wall: Secondary | ICD-10-CM | POA: Diagnosis not present

## 2019-02-11 DIAGNOSIS — I130 Hypertensive heart and chronic kidney disease with heart failure and stage 1 through stage 4 chronic kidney disease, or unspecified chronic kidney disease: Secondary | ICD-10-CM | POA: Diagnosis not present

## 2019-02-11 DIAGNOSIS — Z6841 Body Mass Index (BMI) 40.0 and over, adult: Secondary | ICD-10-CM | POA: Diagnosis not present

## 2019-02-11 DIAGNOSIS — F329 Major depressive disorder, single episode, unspecified: Secondary | ICD-10-CM | POA: Diagnosis not present

## 2019-02-11 DIAGNOSIS — E1122 Type 2 diabetes mellitus with diabetic chronic kidney disease: Secondary | ICD-10-CM | POA: Diagnosis not present

## 2019-02-11 DIAGNOSIS — I5033 Acute on chronic diastolic (congestive) heart failure: Secondary | ICD-10-CM | POA: Diagnosis not present

## 2019-02-11 DIAGNOSIS — I482 Chronic atrial fibrillation, unspecified: Secondary | ICD-10-CM | POA: Diagnosis not present

## 2019-02-11 DIAGNOSIS — S31109D Unspecified open wound of abdominal wall, unspecified quadrant without penetration into peritoneal cavity, subsequent encounter: Secondary | ICD-10-CM | POA: Diagnosis not present

## 2019-02-11 DIAGNOSIS — N183 Chronic kidney disease, stage 3 (moderate): Secondary | ICD-10-CM | POA: Diagnosis not present

## 2019-02-11 DIAGNOSIS — Z87440 Personal history of urinary (tract) infections: Secondary | ICD-10-CM | POA: Diagnosis not present

## 2019-02-11 DIAGNOSIS — J449 Chronic obstructive pulmonary disease, unspecified: Secondary | ICD-10-CM | POA: Diagnosis not present

## 2019-02-11 DIAGNOSIS — E1165 Type 2 diabetes mellitus with hyperglycemia: Secondary | ICD-10-CM | POA: Diagnosis not present

## 2019-02-12 LAB — COPPER, SERUM: Copper: 146 ug/dL (ref 72–166)

## 2019-02-14 DIAGNOSIS — Z87440 Personal history of urinary (tract) infections: Secondary | ICD-10-CM | POA: Diagnosis not present

## 2019-02-14 DIAGNOSIS — F329 Major depressive disorder, single episode, unspecified: Secondary | ICD-10-CM | POA: Diagnosis not present

## 2019-02-14 DIAGNOSIS — S31109D Unspecified open wound of abdominal wall, unspecified quadrant without penetration into peritoneal cavity, subsequent encounter: Secondary | ICD-10-CM | POA: Diagnosis not present

## 2019-02-14 DIAGNOSIS — E1165 Type 2 diabetes mellitus with hyperglycemia: Secondary | ICD-10-CM | POA: Diagnosis not present

## 2019-02-14 DIAGNOSIS — J449 Chronic obstructive pulmonary disease, unspecified: Secondary | ICD-10-CM | POA: Diagnosis not present

## 2019-02-14 DIAGNOSIS — L03311 Cellulitis of abdominal wall: Secondary | ICD-10-CM | POA: Diagnosis not present

## 2019-02-14 DIAGNOSIS — N183 Chronic kidney disease, stage 3 (moderate): Secondary | ICD-10-CM | POA: Diagnosis not present

## 2019-02-14 DIAGNOSIS — I482 Chronic atrial fibrillation, unspecified: Secondary | ICD-10-CM | POA: Diagnosis not present

## 2019-02-14 DIAGNOSIS — I130 Hypertensive heart and chronic kidney disease with heart failure and stage 1 through stage 4 chronic kidney disease, or unspecified chronic kidney disease: Secondary | ICD-10-CM | POA: Diagnosis not present

## 2019-02-14 DIAGNOSIS — E1122 Type 2 diabetes mellitus with diabetic chronic kidney disease: Secondary | ICD-10-CM | POA: Diagnosis not present

## 2019-02-14 DIAGNOSIS — Z6841 Body Mass Index (BMI) 40.0 and over, adult: Secondary | ICD-10-CM | POA: Diagnosis not present

## 2019-02-14 DIAGNOSIS — I5033 Acute on chronic diastolic (congestive) heart failure: Secondary | ICD-10-CM | POA: Diagnosis not present

## 2019-02-14 LAB — METHYLMALONIC ACID, SERUM: Methylmalonic Acid, Quantitative: 166 nmol/L (ref 0–378)

## 2019-02-16 ENCOUNTER — Other Ambulatory Visit: Payer: Self-pay

## 2019-02-16 NOTE — Patient Outreach (Signed)
[place] [facility]) Care Management  [place]  02/16/2019   EZELLE SURPRENANT Jan 13, 1951 [id number]  Subjective:  Outreach for completion of telephonic assessment.  Objective:  Encounter Medications:  Outpatient Encounter Medications as of 02/16/2019  Medication Sig Note  . acetaminophen (TYLENOL) 500 MG tablet Take 500-1,000 mg by mouth every 8 (eight) hours as needed for mild pain or headache.    . albuterol (PROVENTIL HFA;VENTOLIN HFA) 108 (90 Base) MCG/ACT inhaler Inhale 2 puffs into the lungs every 6 (six) hours as needed for wheezing or shortness of breath. (Patient taking differently: Inhale 2 puffs into the lungs every 6 (six) hours as needed for wheezing or shortness of breath (As needed). )   . apixaban (ELIQUIS) 5 MG TABS tablet Take 1 tablet (5 mg total) by mouth 2 (two) times daily.   Marland Kitchen buPROPion (WELLBUTRIN XL) 300 MG 24 hr tablet    . diltiazem (CARDIZEM CD) 120 MG 24 hr capsule Take 1 capsule (120 mg total) by mouth daily. (Patient taking differently: Take 120 mg by mouth daily. Cartia XT)   . escitalopram (LEXAPRO) 10 MG tablet Take 10 mg by mouth daily.   . ferrous sulfate (FERROUSUL) 325 (65 FE) MG tablet Take 1 tablet (325 mg total) by mouth 2 (two) times daily with a meal.   . fluticasone (FLONASE) 50 MCG/ACT nasal spray Place 2 sprays into both nostrils daily as needed for allergies or rhinitis (congestion).    . furosemide (LASIX) 20 MG tablet Take 2 tablets (40 mg total) by mouth daily.   Marland Kitchen glipiZIDE (GLUCOTROL) 10 MG tablet Take 10 mg by mouth 2 (two) times daily.   Marland Kitchen levalbuterol (XOPENEX) 0.63 MG/3ML nebulizer solution    . LEVEMIR FLEXTOUCH 100 UNIT/ML Pen Inject 10 Units into the skin 2 (two) times a day. 01/10/2019: New medication for patient-after dose patient experienced diarrhea-has not taken until speaking with MD regarding potential side effects  . metoprolol tartrate (LOPRESSOR) 25 MG tablet Take 1 tablet (25 mg total) by mouth 2 (two) times  daily. (Patient taking differently: Take 50 mg by mouth every morning. )   . nystatin (MYCOSTATIN/NYSTOP) powder Apply topically daily as needed (rash in skin folds).   Marland Kitchen OVER THE COUNTER MEDICATION Apply 1 application topically daily as needed (rash in skin folds). Baby butt paste   . pantoprazole (PROTONIX) 40 MG tablet Take 1 tablet (40 mg total) by mouth daily before breakfast. (Patient taking differently: Take 40 mg by mouth daily as needed (for GERD/acid reflux). )   . pioglitazone (ACTOS) 45 MG tablet Take 45 mg by mouth daily.   Marland Kitchen senna-docusate (SENOKOT-S) 8.6-50 MG tablet Take 1 tablet by mouth at bedtime as needed for mild constipation.   Marland Kitchen tiotropium (SPIRIVA) 18 MCG inhalation capsule Place 1.25 mcg into inhaler and inhale daily. 01/31/2019: [place] Technician confirmed prescription and dose. Reports medication was not dispensed. Patient reports not taking due to cost.   No facility-administered encounter medications on file as of 02/16/2019.     Functional Status:  In your present state of health, do you have any difficulty performing the following activities: 02/16/2019 01/11/2019  Hearing? N N  Vision? N N  Difficulty concentrating or making decisions? N N  Walking or climbing stairs? Y Y  Dressing or bathing? N N  Doing errands, shopping? [place] Donning  Preparing Food and eating ? N -  Using the Toilet? N -  In the past six months, have you accidently leaked urine?  N -  Do you have problems with loss of bowel control? N -  Managing your Medications? N -  Managing your Finances? N -  Housekeeping or managing your Housekeeping? Y -  Some recent data might be hidden    Fall/Depression Screening: Fall Risk  02/16/2019 12/28/2018 12/16/2018  Falls in the past year? 0 0 -  Comment Reports no falls. - -  Risk for fall due to : Impaired mobility;Medication side effect - Impaired mobility  Follow up Falls prevention discussed - -   PHQ 2/9 Scores 02/16/2019 12/28/2018 12/16/2018  PHQ -  2 Score 1 0 0    Assessment:  Telephonic assessment complete. Mrs. Ging reports fatigue but otherwise feeling well. Denies worsening or concerning symptoms. She is receiving home health physical therapy and skilled nursing services.  She reports having all needed medications. She was previously referred to [facility] due to concerns regarding her inhaler. [facility] was notified and currently providing pharmacy assistance. She denies concerns regarding medication affordability.  She is attempting to follow treatment recommendations. Reports home health nurse is providing wound care and tracking her blood pressure readings.   Most recent readings: Weight-226lbs   FBS-59m/dl  Mrs. KGerhartdenies urgent care management needs. Her husband and granddaughter are available in the home. Reports spouse is available to assist as needed.  She declines need for additional assistance in the home. Declines nutritional concerns. Reports transportation isn't currently a concern. She will request assistance if assistance is needed. Declines need for community resource referrals. THN CM Care Plan Problem One     Most Recent Value  Care Plan Problem One  Risk for Readmission  Role Documenting the Problem One  Care [place]  Active  THN Long Term Goal   Patient will not be hospitalized over the next 90 days.  THN Long Term Goal Start Date  02/16/19  Interventions for Problem One Long Term Goal  Discussed medications, nutrition, weight parameters, blood glucose levels and  BP ranges. Discussed s/sx of infections and indications for notifying MD.  [facility]   Over the next 30 days patient will take all medications as prescribed.   THN CM Short Term Goal #1 Start Date  02/16/19  Interventions for Short Term Goal #1  Reviewed medications and indications for use.  [[facility] to provide assistance with med assistance.]  THN CM Short Term Goal #2    Over the next 30 days patient will monitor abdominal wounds and reports s/sx of infection.  THN CM Short Term Goal #2 Start Date  02/16/19  Interventions for Short Term Goal #2  Encouraged to follow recommendations for cleaning and dressing abdominal wounds. Discussed s/sx of infection. Wound care currently being provided by home health nurse.  THN CM Short Term Goal #3  Over the next 30 days, patient will monitor weights, BP and blood sugar levels daily.  THN CM Short Term Goal #3 Start Date  02/16/19  Interventions for Short Tern Goal #3  Discussed parameters. Encouraged to monitor daily and maintain log.       PLAN -Will follow-up next week.   [place]([phone]

## 2019-02-17 ENCOUNTER — Encounter (HOSPITAL_COMMUNITY): Payer: Self-pay | Admitting: Emergency Medicine

## 2019-02-17 ENCOUNTER — Inpatient Hospital Stay (HOSPITAL_COMMUNITY)
Admission: EM | Admit: 2019-02-17 | Discharge: 2019-02-19 | DRG: 640 | Disposition: A | Discharge status: Home Health | Payer: BC Managed Care – PPO | Source: Ambulatory Visit | Attending: Internal Medicine | Admitting: Internal Medicine

## 2019-02-17 ENCOUNTER — Emergency Department (HOSPITAL_COMMUNITY): Payer: BC Managed Care – PPO

## 2019-02-17 ENCOUNTER — Inpatient Hospital Stay (HOSPITAL_COMMUNITY): Payer: BC Managed Care – PPO | Attending: Hematology

## 2019-02-17 ENCOUNTER — Other Ambulatory Visit: Payer: Self-pay

## 2019-02-17 ENCOUNTER — Encounter (HOSPITAL_COMMUNITY): Payer: Self-pay

## 2019-02-17 VITALS — BP 160/51 | HR 50 | Resp 19

## 2019-02-17 DIAGNOSIS — I5033 Acute on chronic diastolic (congestive) heart failure: Secondary | ICD-10-CM | POA: Diagnosis not present

## 2019-02-17 DIAGNOSIS — I130 Hypertensive heart and chronic kidney disease with heart failure and stage 1 through stage 4 chronic kidney disease, or unspecified chronic kidney disease: Secondary | ICD-10-CM | POA: Diagnosis present

## 2019-02-17 DIAGNOSIS — Z87440 Personal history of urinary (tract) infections: Secondary | ICD-10-CM

## 2019-02-17 DIAGNOSIS — I4891 Unspecified atrial fibrillation: Secondary | ICD-10-CM | POA: Diagnosis not present

## 2019-02-17 DIAGNOSIS — D508 Other iron deficiency anemias: Secondary | ICD-10-CM

## 2019-02-17 DIAGNOSIS — R0602 Shortness of breath: Secondary | ICD-10-CM | POA: Diagnosis not present

## 2019-02-17 DIAGNOSIS — E1165 Type 2 diabetes mellitus with hyperglycemia: Secondary | ICD-10-CM | POA: Diagnosis present

## 2019-02-17 DIAGNOSIS — Z6838 Body mass index (BMI) 38.0-38.9, adult: Secondary | ICD-10-CM

## 2019-02-17 DIAGNOSIS — J449 Chronic obstructive pulmonary disease, unspecified: Secondary | ICD-10-CM | POA: Diagnosis present

## 2019-02-17 DIAGNOSIS — F329 Major depressive disorder, single episode, unspecified: Secondary | ICD-10-CM | POA: Diagnosis present

## 2019-02-17 DIAGNOSIS — J810 Acute pulmonary edema: Secondary | ICD-10-CM | POA: Diagnosis present

## 2019-02-17 DIAGNOSIS — E8771 Transfusion associated circulatory overload: Secondary | ICD-10-CM | POA: Diagnosis not present

## 2019-02-17 DIAGNOSIS — Z90710 Acquired absence of both cervix and uterus: Secondary | ICD-10-CM | POA: Diagnosis not present

## 2019-02-17 DIAGNOSIS — E11622 Type 2 diabetes mellitus with other skin ulcer: Secondary | ICD-10-CM | POA: Diagnosis not present

## 2019-02-17 DIAGNOSIS — Y848 Other medical procedures as the cause of abnormal reaction of the patient, or of later complication, without mention of misadventure at the time of the procedure: Secondary | ICD-10-CM | POA: Diagnosis present

## 2019-02-17 DIAGNOSIS — D509 Iron deficiency anemia, unspecified: Secondary | ICD-10-CM | POA: Diagnosis present

## 2019-02-17 DIAGNOSIS — Z833 Family history of diabetes mellitus: Secondary | ICD-10-CM

## 2019-02-17 DIAGNOSIS — Z7901 Long term (current) use of anticoagulants: Secondary | ICD-10-CM | POA: Diagnosis not present

## 2019-02-17 DIAGNOSIS — IMO0002 Reserved for concepts with insufficient information to code with codable children: Secondary | ICD-10-CM | POA: Diagnosis present

## 2019-02-17 DIAGNOSIS — L98499 Non-pressure chronic ulcer of skin of other sites with unspecified severity: Secondary | ICD-10-CM | POA: Diagnosis not present

## 2019-02-17 DIAGNOSIS — Z20828 Contact with and (suspected) exposure to other viral communicable diseases: Secondary | ICD-10-CM | POA: Diagnosis not present

## 2019-02-17 DIAGNOSIS — E1122 Type 2 diabetes mellitus with diabetic chronic kidney disease: Secondary | ICD-10-CM | POA: Diagnosis not present

## 2019-02-17 DIAGNOSIS — Z993 Dependence on wheelchair: Secondary | ICD-10-CM

## 2019-02-17 DIAGNOSIS — I5032 Chronic diastolic (congestive) heart failure: Secondary | ICD-10-CM | POA: Diagnosis present

## 2019-02-17 DIAGNOSIS — Z79899 Other long term (current) drug therapy: Secondary | ICD-10-CM | POA: Diagnosis not present

## 2019-02-17 DIAGNOSIS — L98492 Non-pressure chronic ulcer of skin of other sites with fat layer exposed: Secondary | ICD-10-CM | POA: Diagnosis not present

## 2019-02-17 DIAGNOSIS — Z91040 Latex allergy status: Secondary | ICD-10-CM

## 2019-02-17 DIAGNOSIS — M810 Age-related osteoporosis without current pathological fracture: Secondary | ICD-10-CM | POA: Diagnosis present

## 2019-02-17 DIAGNOSIS — N183 Chronic kidney disease, stage 3 unspecified: Secondary | ICD-10-CM | POA: Diagnosis present

## 2019-02-17 DIAGNOSIS — F32A Depression, unspecified: Secondary | ICD-10-CM | POA: Diagnosis present

## 2019-02-17 DIAGNOSIS — Z9049 Acquired absence of other specified parts of digestive tract: Secondary | ICD-10-CM

## 2019-02-17 DIAGNOSIS — Z803 Family history of malignant neoplasm of breast: Secondary | ICD-10-CM

## 2019-02-17 DIAGNOSIS — Z888 Allergy status to other drugs, medicaments and biological substances status: Secondary | ICD-10-CM | POA: Diagnosis not present

## 2019-02-17 DIAGNOSIS — I10 Essential (primary) hypertension: Secondary | ICD-10-CM | POA: Diagnosis present

## 2019-02-17 LAB — CBC WITH DIFFERENTIAL/PLATELET
Abs Immature Granulocytes: 0.61 10*3/uL — ABNORMAL HIGH (ref 0.00–0.07)
Basophils Absolute: 0.1 10*3/uL (ref 0.0–0.1)
Basophils Relative: 0 %
Eosinophils Absolute: 0.1 10*3/uL (ref 0.0–0.5)
Eosinophils Relative: 0 %
HCT: 36.7 % (ref 36.0–46.0)
Hemoglobin: 10.4 g/dL — ABNORMAL LOW (ref 12.0–15.0)
Immature Granulocytes: 2 %
Lymphocytes Relative: 2 %
Lymphs Abs: 0.6 10*3/uL — ABNORMAL LOW (ref 0.7–4.0)
MCH: 25.9 pg — ABNORMAL LOW (ref 26.0–34.0)
MCHC: 28.3 g/dL — ABNORMAL LOW (ref 30.0–36.0)
MCV: 91.5 fL (ref 80.0–100.0)
Monocytes Absolute: 0.6 10*3/uL (ref 0.1–1.0)
Monocytes Relative: 2 %
Neutro Abs: 25.2 10*3/uL — ABNORMAL HIGH (ref 1.7–7.7)
Neutrophils Relative %: 94 %
Platelets: 346 10*3/uL (ref 150–400)
RBC: 4.01 MIL/uL (ref 3.87–5.11)
RDW: 19.5 % — ABNORMAL HIGH (ref 11.5–15.5)
WBC: 27.1 10*3/uL — ABNORMAL HIGH (ref 4.0–10.5)
nRBC: 0 % (ref 0.0–0.2)

## 2019-02-17 LAB — GLUCOSE, CAPILLARY: Glucose-Capillary: 217 mg/dL — ABNORMAL HIGH (ref 70–99)

## 2019-02-17 LAB — CBC
HCT: 29 % — ABNORMAL LOW (ref 36.0–46.0)
Hemoglobin: 8.5 g/dL — ABNORMAL LOW (ref 12.0–15.0)
MCH: 26.1 pg (ref 26.0–34.0)
MCHC: 29.3 g/dL — ABNORMAL LOW (ref 30.0–36.0)
MCV: 89 fL (ref 80.0–100.0)
Platelets: 252 10*3/uL (ref 150–400)
RBC: 3.26 MIL/uL — ABNORMAL LOW (ref 3.87–5.11)
RDW: 19.1 % — ABNORMAL HIGH (ref 11.5–15.5)
WBC: 17.7 10*3/uL — ABNORMAL HIGH (ref 4.0–10.5)
nRBC: 0 % (ref 0.0–0.2)

## 2019-02-17 LAB — BRAIN NATRIURETIC PEPTIDE: B Natriuretic Peptide: 1510 pg/mL — ABNORMAL HIGH (ref 0.0–100.0)

## 2019-02-17 LAB — BLOOD GAS, VENOUS
Acid-Base Excess: 0.4 mmol/L (ref 0.0–2.0)
Bicarbonate: 24.5 mmol/L (ref 20.0–28.0)
FIO2: 30
O2 Saturation: 69.4 %
Patient temperature: 36.6
pCO2, Ven: 35.6 mmHg — ABNORMAL LOW (ref 44.0–60.0)
pH, Ven: 7.444 — ABNORMAL HIGH (ref 7.250–7.430)
pO2, Ven: 37.8 mmHg (ref 32.0–45.0)

## 2019-02-17 LAB — MAGNESIUM: Magnesium: 1.5 mg/dL — ABNORMAL LOW (ref 1.7–2.4)

## 2019-02-17 LAB — SARS CORONAVIRUS 2 BY RT PCR (HOSPITAL ORDER, PERFORMED IN ~~LOC~~ HOSPITAL LAB): SARS Coronavirus 2: NEGATIVE

## 2019-02-17 LAB — MRSA PCR SCREENING: MRSA by PCR: NEGATIVE

## 2019-02-17 MED ORDER — FUROSEMIDE 10 MG/ML IJ SOLN
40.0000 mg | Freq: Two times a day (BID) | INTRAMUSCULAR | Status: DC
  Administered 2019-02-18: 40 mg via INTRAVENOUS
  Filled 2019-02-17: qty 4

## 2019-02-17 MED ORDER — METHYLPREDNISOLONE SODIUM SUCC 125 MG IJ SOLR
INTRAMUSCULAR | Status: AC
  Filled 2019-02-17: qty 2

## 2019-02-17 MED ORDER — INSULIN ASPART 100 UNIT/ML ~~LOC~~ SOLN
0.0000 [IU] | Freq: Three times a day (TID) | SUBCUTANEOUS | Status: DC
  Administered 2019-02-18 (×2): 3 [IU] via SUBCUTANEOUS
  Administered 2019-02-18: 7 [IU] via SUBCUTANEOUS

## 2019-02-17 MED ORDER — ESCITALOPRAM OXALATE 10 MG PO TABS
10.0000 mg | ORAL_TABLET | Freq: Every day | ORAL | Status: DC
  Administered 2019-02-18 – 2019-02-19 (×2): 10 mg via ORAL
  Filled 2019-02-17 (×2): qty 1

## 2019-02-17 MED ORDER — FERROUS SULFATE 325 (65 FE) MG PO TABS
325.0000 mg | ORAL_TABLET | Freq: Two times a day (BID) | ORAL | Status: DC
  Administered 2019-02-18 – 2019-02-19 (×3): 325 mg via ORAL
  Filled 2019-02-17 (×3): qty 1

## 2019-02-17 MED ORDER — INSULIN DETEMIR 100 UNIT/ML ~~LOC~~ SOLN
10.0000 [IU] | Freq: Two times a day (BID) | SUBCUTANEOUS | Status: DC
  Administered 2019-02-17 – 2019-02-19 (×4): 10 [IU] via SUBCUTANEOUS
  Filled 2019-02-17 (×9): qty 0.1

## 2019-02-17 MED ORDER — NITROGLYCERIN IN D5W 200-5 MCG/ML-% IV SOLN
5.0000 ug/min | INTRAVENOUS | Status: DC
  Administered 2019-02-17: 5 ug/min via INTRAVENOUS
  Filled 2019-02-17: qty 250

## 2019-02-17 MED ORDER — ONDANSETRON HCL 4 MG/2ML IJ SOLN
4.0000 mg | Freq: Once | INTRAMUSCULAR | Status: AC
  Administered 2019-02-17: 4 mg via INTRAVENOUS
  Filled 2019-02-17: qty 2

## 2019-02-17 MED ORDER — NITROGLYCERIN PEDIATRIC IV INFUSION 100 MCG/ML: 200.0000 ug/min | INTRAVENOUS | Status: DC

## 2019-02-17 MED ORDER — CHLORHEXIDINE GLUCONATE CLOTH 2 % EX PADS
6.0000 | MEDICATED_PAD | Freq: Every day | CUTANEOUS | Status: DC
  Administered 2019-02-19: 6 via TOPICAL

## 2019-02-17 MED ORDER — MAGNESIUM SULFATE 2 GM/50ML IV SOLN
2.0000 g | Freq: Once | INTRAVENOUS | Status: AC
  Administered 2019-02-17: 2 g via INTRAVENOUS
  Filled 2019-02-17: qty 50

## 2019-02-17 MED ORDER — ACETAMINOPHEN 325 MG PO TABS: 650.0000 mg | ORAL_TABLET | Freq: Four times a day (QID) | ORAL | Status: DC | PRN

## 2019-02-17 MED ORDER — NYSTATIN 100000 UNIT/GM EX POWD: Freq: Every day | CUTANEOUS | Status: DC | PRN

## 2019-02-17 MED ORDER — FUROSEMIDE 10 MG/ML IJ SOLN
40.0000 mg | Freq: Once | INTRAMUSCULAR | Status: AC
  Administered 2019-02-17: 40 mg via INTRAVENOUS
  Filled 2019-02-17: qty 4

## 2019-02-17 MED ORDER — METOPROLOL TARTRATE 50 MG PO TABS
50.0000 mg | ORAL_TABLET | Freq: Two times a day (BID) | ORAL | Status: DC
  Administered 2019-02-17 – 2019-02-18 (×2): 50 mg via ORAL
  Filled 2019-02-17 (×4): qty 1

## 2019-02-17 MED ORDER — LEVALBUTEROL HCL 0.63 MG/3ML IN NEBU: 0.6300 mg | INHALATION_SOLUTION | Freq: Four times a day (QID) | RESPIRATORY_TRACT | Status: DC | PRN

## 2019-02-17 MED ORDER — ACETAMINOPHEN 650 MG RE SUPP: 650.0000 mg | Freq: Four times a day (QID) | RECTAL | Status: DC | PRN

## 2019-02-17 MED ORDER — PANTOPRAZOLE SODIUM 40 MG PO TBEC
40.0000 mg | DELAYED_RELEASE_TABLET | Freq: Every day | ORAL | Status: DC
  Administered 2019-02-18 – 2019-02-19 (×2): 40 mg via ORAL
  Filled 2019-02-17 (×2): qty 1

## 2019-02-17 MED ORDER — SODIUM CHLORIDE 0.9 % IV SOLN
510.0000 mg | Freq: Once | INTRAVENOUS | Status: AC
  Administered 2019-02-17: 510 mg via INTRAVENOUS
  Filled 2019-02-17: qty 510

## 2019-02-17 MED ORDER — APIXABAN 5 MG PO TABS
5.0000 mg | ORAL_TABLET | Freq: Two times a day (BID) | ORAL | Status: DC
  Administered 2019-02-17 – 2019-02-19 (×4): 5 mg via ORAL
  Filled 2019-02-17 (×4): qty 1

## 2019-02-17 MED ORDER — DILTIAZEM HCL ER COATED BEADS 120 MG PO CP24
120.0000 mg | ORAL_CAPSULE | Freq: Every day | ORAL | Status: DC
  Administered 2019-02-18 – 2019-02-19 (×2): 120 mg via ORAL
  Filled 2019-02-17 (×2): qty 1

## 2019-02-17 MED ORDER — ORAL CARE MOUTH RINSE
15.0000 mL | Freq: Two times a day (BID) | OROMUCOSAL | Status: DC
  Administered 2019-02-17 – 2019-02-19 (×3): 15 mL via OROMUCOSAL

## 2019-02-17 MED ORDER — DIPHENHYDRAMINE HCL 50 MG/ML IJ SOLN
25.0000 mg | Freq: Once | INTRAMUSCULAR | Status: AC
  Administered 2019-02-17: 25 mg via INTRAVENOUS

## 2019-02-17 MED ORDER — BUPROPION HCL ER (XL) 300 MG PO TB24
300.0000 mg | ORAL_TABLET | Freq: Every day | ORAL | Status: DC
  Administered 2019-02-18 – 2019-02-19 (×2): 300 mg via ORAL
  Filled 2019-02-17: qty 2
  Filled 2019-02-17: qty 1

## 2019-02-17 MED ORDER — METHYLPREDNISOLONE SODIUM SUCC 125 MG IJ SOLR
125.0000 mg | Freq: Once | INTRAMUSCULAR | Status: AC
  Administered 2019-02-17: 125 mg via INTRAVENOUS

## 2019-02-17 MED ORDER — LISINOPRIL 5 MG PO TABS
2.5000 mg | ORAL_TABLET | Freq: Every day | ORAL | Status: DC
  Administered 2019-02-17: 2.5 mg via ORAL
  Filled 2019-02-17: qty 1

## 2019-02-17 MED ORDER — POTASSIUM CHLORIDE CRYS ER 20 MEQ PO TBCR
40.0000 meq | EXTENDED_RELEASE_TABLET | Freq: Once | ORAL | Status: AC
  Administered 2019-02-17: 40 meq via ORAL
  Filled 2019-02-17: qty 2

## 2019-02-17 MED ORDER — SODIUM CHLORIDE 0.9 % IV SOLN
Freq: Once | INTRAVENOUS | Status: AC
  Administered 2019-02-17: 13:00:00 via INTRAVENOUS

## 2019-02-17 MED ORDER — GLIPIZIDE 5 MG PO TABS
10.0000 mg | ORAL_TABLET | Freq: Two times a day (BID) | ORAL | Status: DC
  Administered 2019-02-18 – 2019-02-19 (×3): 10 mg via ORAL
  Filled 2019-02-17 (×3): qty 2

## 2019-02-17 MED ORDER — PIOGLITAZONE HCL 15 MG PO TABS
45.0000 mg | ORAL_TABLET | Freq: Every day | ORAL | Status: DC
  Filled 2019-02-17: qty 3
  Filled 2019-02-17: qty 1
  Filled 2019-02-17 (×2): qty 3

## 2019-02-17 NOTE — H&P (Signed)
History and Physical    [name] [facility]MC:[id number] DOB: 1951/01/09 DOA: 02/17/2019  PCP: [name], MD   Patient coming from: Home.  I have personally briefly reviewed patient's old medical records in [place]  Chief Complaint: Medication reaction.  HPI: [name] is a 68 y.o. female with medical history significant of his working atrial fibrillation, LV thrombus, diastolic CHF, hypertension, depression, type 2 diabetes, morbid obesity, osteoporosis, urinary incontinence, history of Klebsiella pneumonia UTI who was brought from the cancer center after developing an iron infusion reaction.  She was given Solu-Medrol and Benadryl.  She is states that shortly after the infusion was started she developed pain between her shoulder blades and mild chest pressure.  This was associated with palpitations, mild diaphoresis and progressively worse dyspnea.  She denies fever, chills, sore throat, but states she was coughing whitish sputum earlier.  She also has mild lower extremity edema.  She has occasional nausea, but denies abdominal pain, recent emesis, diarrhea, constipation, melena or hematochezia.  No dysuria, frequency or hematuria.  Denies polyuria, polydipsia, polyphagia.  ED Course: Initial vital signs temperature 97.8 F, pulse 68, respirations 25, blood pressure 201/67 mmHg and O2 sat 99% on nasal cannula oxygen.  The patient was given supplemental oxygen, furosemide 40 mg IVP and was started on a nitroglycerin infusion.  Most recent vital signs on arrival to the ICU pulse 62, respirations 22, blood pressure 140 a over 55 mmHg and O2 sat 94% on room air.  O2 sat at 2 LPM [place] is 100%.  A venous gas showed a pH of 7.44 and a decreased PCO2 of 35.6 mmHg, but all other values were within normal limits.  CBC had a white count 27.1 with 94% neutrophils, hemoglobin 10.4 g/dL and platelets 346.  BNP was 1510.0 pg/mL.  SARS coronavirus 2 nasal swab was negative.  Magnesium level  was 1.5 mg/dL.  A CMP done at the cancer center showed a glucose of 147 and a calcium of 8.8 mg/dL.  Her albumin was 3.0 g/dL.  All other values were unremarkable.  Imaging: Chest radiograph show increased central pulmonary vascular congestion with bilateral pulmonary edema.  Please see images and full radiology report for further detail.  Review of Systems: As per HPI otherwise 10 point review of systems negative.   Past Medical History:  Diagnosis Date  . Atrial fibrillation ([place])   . CHF (congestive heart failure) ([place])   . Depression   . Diabetes mellitus without complication ([place])   . 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 Hypertension   . Morbid obesity ([place])   . Osteoporosis   . Urinary incontinence   . UTI (lower urinary tract infection) 01/2016   Cipro for Klebsiella pneumoniae isolate  . Wheelchair bound    bedbound    Past Surgical History:  Procedure Laterality Date  . ABDOMINAL HYSTERECTOMY    . BIOPSY  09/06/2018   Procedure: BIOPSY;  Surgeon: Danie Binder, MD;  [facility];  Service: Endoscopy;;  gastric bx's  . CESAREAN SECTION    . CHOLECYSTECTOMY    . ESOPHAGEAL DILATION  09/06/2018   Procedure: ESOPHAGEAL DILATION;  Surgeon: Danie Binder, MD;  [facility];  Service: Endoscopy;;  . ESOPHAGOGASTRODUODENOSCOPY (EGD) WITH PROPOFOL N/A 09/06/2018   Procedure: ESOPHAGOGASTRODUODENOSCOPY (EGD) WITH PROPOFOL;  Surgeon: Danie Binder, MD;  [facility];  Service: Endoscopy;  Laterality: N/A;  dilatation  . FEMUR IM  NAIL Left 02/20/2016  . FEMUR IM NAIL Left 02/20/2016   Procedure: INTRAMEDULLARY (IM) RETROGRADE FEMORAL NAILING;  Surgeon: Leandrew Koyanagi, MD;  Location: [place];  Service: Orthopedics;  Laterality: Left;  . PANCREAS SURGERY  1967   1 cyst excised and one cyst drained  . TEE WITHOUT CARDIOVERSION N/A 04/05/2018   Procedure: TRANSESOPHAGEAL ECHOCARDIOGRAM (TEE);  Surgeon: Dorothy Spark, MD;  Location: [place];  Service: Cardiovascular;  Laterality: N/A;  . [place]    . WRIST FRACTURE SURGERY       reports that she has never smoked. She has never used smokeless tobacco. She reports that she does not drink alcohol or use drugs.  Allergies  Allergen Reactions  . Propranolol Swelling    Pt states it may have been leg swelling  . Topamax [Topiramate] Other (See Comments)    hallucinations  . Latex Itching and Rash    Family History  Problem Relation Age of Onset  . Diabetes Mother        died in her 47's of a stroke  . Cancer Mother   . Stroke Mother   . Breast cancer Mother   . Diabetes Father        died in his 52's of a stroke.  . Stroke Father   . Diabetes Brother        died @ 56 of a stroke.  . Stroke Brother   . Diabetes Maternal Grandmother   . Diabetes Maternal Grandfather   . Diabetes Paternal Grandmother   . Diabetes Paternal Grandfather   . Breast cancer Maternal Aunt    Prior to Admission medications   Medication Sig Start Date End Date Taking? Authorizing Provider  albuterol (PROVENTIL HFA;VENTOLIN HFA) 108 (90 Base) MCG/ACT inhaler Inhale 2 puffs into the lungs every 6 (six) hours as needed for wheezing or shortness of breath. Patient taking differently: Inhale 2 puffs into the lungs every 6 (six) hours as needed for wheezing or shortness of breath (As needed).  08/14/18  Yes Kathie Dike, MD  apixaban (ELIQUIS) 5 MG TABS tablet Take 1 tablet (5 mg total) by mouth 2 (two) times daily. 07/21/18  Yes Herminio Commons, MD  acetaminophen (TYLENOL) 500 MG tablet Take 500-1,000 mg by mouth every 8 (eight) hours as needed for mild pain or headache.     [provider]  buPROPion (WELLBUTRIN XL) 300 MG 24 hr tablet  01/31/19   [provider]  diltiazem (CARDIZEM CD) 120 MG 24 hr capsule Take 1 capsule (120 mg total) by mouth daily. Patient taking differently: Take 120 mg by mouth daily. Cartia  XT 09/10/18   Johnson, Clanford L, MD  escitalopram (LEXAPRO) 10 MG tablet Take 10 mg by mouth daily. 12/28/18   [provider]  ferrous sulfate (FERROUSUL) 325 (65 FE) MG tablet Take 1 tablet (325 mg total) by mouth 2 (two) times daily with a meal. 01/16/19   Domenic Polite, MD  fluticasone [facility]) 50 MCG/ACT nasal spray Place 2 sprays into both nostrils daily as needed for allergies or rhinitis (congestion).     [provider]  furosemide (LASIX) 40 MG tablet Take 40 mg by mouth daily. 02/08/19   [provider]  glipiZIDE (GLUCOTROL) 10 MG tablet Take 10 mg by mouth 2 (two) times daily. 03/02/18   [provider]  levalbuterol Penne Lash) 0.63 MG/3ML nebulizer solution  02/08/19   [provider]  LEVEMIR FLEXTOUCH 100 UNIT/ML Pen Inject 10 Units  into the skin 2 (two) times a day. 01/05/19   [provider]  metoprolol tartrate (LOPRESSOR) 25 MG tablet Take 1 tablet (25 mg total) by mouth 2 (two) times daily. Patient taking differently: Take 50 mg by mouth every morning.  09/10/18   Murlean Iba, MD  metoprolol tartrate (LOPRESSOR) 50 MG tablet  02/12/19   [provider]  nystatin (MYCOSTATIN/NYSTOP) powder Apply topically daily as needed (rash in skin folds).    [provider]  OVER THE COUNTER MEDICATION Apply 1 application topically daily as needed (rash in skin folds). Baby butt paste    [provider]  pantoprazole (PROTONIX) 40 MG tablet Take 1 tablet (40 mg total) by mouth daily before breakfast. Patient taking differently: Take 40 mg by mouth daily as needed (for GERD/acid reflux).  09/11/18   Johnson, Clanford L, MD  pioglitazone (ACTOS) 45 MG tablet Take 45 mg by mouth daily. 10/30/18   [provider]  senna-docusate (SENOKOT-S) 8.6-50 MG tablet Take 1 tablet by mouth at bedtime as needed for mild constipation. 01/16/19   Domenic Polite, MD  tiotropium (SPIRIVA) 18 MCG inhalation capsule Place  1.25 mcg into inhaler and inhale daily.    [provider]    Physical Exam: Vitals:   02/17/19 1545 02/17/19 1600 02/17/19 1630 02/17/19 1700  BP:  (!) 155/70 (!) 150/72 (!) 155/64  Pulse: (!) 58 (!) 58 60   Resp: (!) 22 (!) 28 (!) 24 18  Temp:      TempSrc:      SpO2: 94% 93% 95%   Weight:      Height:        Constitutional: NAD, calm, comfortable Eyes: PERRL, lids and conjunctivae normal ENMT: Mucous membranes are moist. Posterior pharynx clear of any exudate or lesions. Neck: normal, supple, no masses, no thyromegaly Respiratory: Bibasilar rales with decreased air movement on bases. Normal respiratory effort. No accessory muscle use.  Cardiovascular: Irregularly irregular with a heart rate in the 09T, soft diastolic murmur, no rubs / gallops.  Trace lower extremities pitting edema. 2+ pedal pulses. No carotid bruits.  Abdomen: Obese, soft, no tenderness, no masses palpated. No hepatosplenomegaly. Bowel sounds positive.  Musculoskeletal: no clubbing / cyanosis. Good ROM, no contractures. Normal muscle tone.  Skin: Multiple surgical scars on lower extremities.  Small areas of ecchymosis on extremities. Neurologic: CN 2-12 grossly intact. Sensation intact, DTR normal. Strength 5/5 in all 4.  Psychiatric: Normal judgment and insight. Alert and oriented x 3. Normal mood.   Labs on Admission: I have personally reviewed following labs and imaging studies  CBC: Recent Labs  Lab 02/17/19 1522  WBC 27.1*  NEUTROABS 25.2*  HGB 10.4*  HCT 36.7  MCV 91.5  PLT 267   Basic Metabolic Panel: No results for input(s): NA, K, CL, CO2, GLUCOSE, BUN, CREATININE, CALCIUM, MG, PHOS in the last 168 hours. GFR: CrCl cannot be calculated (Patient's most recent lab result is older than the maximum 21 days allowed.). Liver Function Tests: No results for input(s): AST, ALT, ALKPHOS, BILITOT, PROT, ALBUMIN in the last 168 hours. No results for input(s): LIPASE, AMYLASE in the last 168  hours. No results for input(s): AMMONIA in the last 168 hours. Coagulation Profile: No results for input(s): INR, PROTIME in the last 168 hours. Cardiac Enzymes: No results for input(s): CKTOTAL, CKMB, CKMBINDEX, TROPONINI in the last 168 hours. BNP (last 3 results) No results for input(s): PROBNP in the last 8760 hours. HbA1C: No results for  input(s): HGBA1C in the last 72 hours. CBG: No results for input(s): GLUCAP in the last 168 hours. Lipid Profile: No results for input(s): CHOL, HDL, LDLCALC, TRIG, CHOLHDL, LDLDIRECT in the last 72 hours. Thyroid Function Tests: No results for input(s): TSH, T4TOTAL, FREET4, T3FREE, THYROIDAB in the last 72 hours. Anemia Panel: No results for input(s): VITAMINB12, FOLATE, FERRITIN, TIBC, IRON, RETICCTPCT in the last 72 hours. Urine analysis:    Component Value Date/Time   COLORURINE AMBER (A) 01/10/2019 1546   APPEARANCEUR CLOUDY (A) 01/10/2019 1546   LABSPEC 1.016 01/10/2019 1546   PHURINE 5.0 01/10/2019 1546   GLUCOSEU NEGATIVE 01/10/2019 1546   HGBUR MODERATE (A) 01/10/2019 1546   BILIRUBINUR NEGATIVE 01/10/2019 1546   KETONESUR NEGATIVE 01/10/2019 1546   PROTEINUR 100 (A) 01/10/2019 1546   UROBILINOGEN 0.2 12/01/2007 1025   NITRITE NEGATIVE 01/10/2019 1546   LEUKOCYTESUR LARGE (A) 01/10/2019 1546    Radiological Exams on Admission: Dg Chest Port 1 View  Result Date: 02/17/2019 CLINICAL DATA:  Shortness of breath. EXAM: PORTABLE CHEST 1 VIEW COMPARISON:  Radiograph Jan 10, 2019. FINDINGS: Stable cardiomediastinal silhouette. Increased central pulmonary vascular congestion is noted with possible bilateral pulmonary edema. No pneumothorax or pleural effusion is noted. Bony thorax is unremarkable. IMPRESSION: Increased central pulmonary vascular congestion is noted with possible bilateral pulmonary edema. Electronically Signed   By: Marijo Conception M.D.   On: 02/17/2019 15:49   01/11/2019 echocardiogram  IMPRESSIONS   1. The left  ventricle has low normal systolic function, with an ejection fraction of 50-55%. The cavity size was normal. There is mild concentric left ventricular hypertrophy. Left ventricular diastolic Doppler parameters are consistent with  pseudonormalization. Elevated mean left atrial pressure There is right ventricular volume overload. No evidence of left ventricular regional wall motion abnormalities.  2. The right ventricle has normal systolic function. The cavity was normal. There is no increase in right ventricular wall thickness. Right ventricular systolic pressure is mildly elevated with an estimated pressure of 47.9 mmHg.  3. Left atrial size was moderately dilated.  4. The mitral valve is degenerative. Mild thickening of the mitral valve leaflet. There is severe mitral annular calcification present. Possible very mild mitral valve stenosis (normal gradients at slow heart rate of 58 bpm).  5. The tricuspid valve is grossly normal. Tricuspid valve regurgitation is moderate-severe.  6. The aortic valve is tricuspid. Moderate thickening of the aortic valve. Mild calcification of the aortic valve.  7. The aortic root and ascending aorta are normal in size and structure.  EKG: Independently reviewed. Vent. rate 154 BPM PR interval * ms QRS duration 92 ms QT/QTc 260/354 ms P-R-T axes * 70 269 Atrial fibrillation with rapid V-rate Paired ventricular premature complexes Probable lateral infarct, age indeterminate There is significant artifact.  Assessment/Plan Principal Problem:   Acute pulmonary edema (HCC)   Chronic diastolic CHF (congestive heart failure) ([place]) Observation/stepdown. Continue supplemental oxygen. Continue nitroglycerin infusion. Continue furosemide 40 mg IVP twice daily. Monitor and correct electrolytes as needed. Monitor daily weights, intake and output.  Active Problems:   Diabetes type 2, uncontrolled (HCC)  Hemoglobin A1c 7.7% on 12/05/2018. Carbohydrate modified  diet. Continue Levemir 10 units SQ twice daily. Continue glipizide 10 mg p.o. twice daily. Continue Actos 45 mg p.o. daily. CBG monitoring with regular insulin sliding scale. Expect blood glucose to be high her due to earlier Solu-Medrol administration.    HTN (hypertension) Monitor blood pressure. Currently on furosemide 40 mg IVP twice daily. If clinically improved, resume  Cardizem and metoprolol in a.m.    Depression Continue Lexapro 10 mg p.o. daily. Continue Wellbutrin 300 mg p.o. daily.    Atrial fibrillation (HCC) CHA?DS?-VASc Score of at least 5. Resume Cardizem in a.m. Hold beta-blocker tonight, but resume in a.m. Continue apixaban.    CKD (chronic kidney disease), stage III (HCC) Monitor renal function electrolytes.    COPD (chronic obstructive pulmonary disease) (HCC) No wheezing. Continue supplemental oxygen and Xopenex.    Iron deficiency anemia Continue oral iron supplementation. Monitor H&H.   DVT prophylaxis: On apixaban. Code Status: Full code. Family Communication: Disposition Plan: Observation for pulmonary edema treatment. Consults called: Admission status: Observation/stepdown.   Reubin Milan MD [facility]  02/17/2019, 5:44 PM   This document was prepared using Dragon voice crenation software and may contain some unintended transcription errors.

## 2019-02-17 NOTE — ED Notes (Signed)
Lab at bedside

## 2019-02-17 NOTE — ED Provider Notes (Signed)
[facility] EMERGENCY DEPARTMENT Provider Note   CSN: [id number] Arrival date & time: 02/17/19  1413     History   Chief Complaint Chief Complaint  Patient presents with  . Transfusion Reaction    HPI [name] is a 68 y.o. female who presents to the emergency department after suspected transfusion reaction this morning. She  has a past medical history of Atrial fibrillation ([place]), CHF (congestive heart failure) ([place]), Depression, Diabetes mellitus without complication ([place]), History of transesophageal echocardiography (TEE) (03/2018), Hypertension, Morbid obesity ([place]), Osteoporosis, Urinary incontinence, UTI (lower urinary tract infection) (01/2016), and Wheelchair bound.  The patient is a poor historian which limits the HPI.  History is gathered from the patient and review of EMR.  The patient has a history of iron deficiency anemia requiring iron transfusions.  She was at the cancer center this morning receiving her regular iron infusion when she had sudden onset of rapidly rising hypotension, bradycardia in the 50s and documented bradycardia down to the 30s.  She became acutely short of breath and was given IV Solu-Medrol and Benadryl prior to arrival in the emergency department.  Patient is currently receiving oxygen via nasal cannula but she states that she does not normally use it.Hx is otherwise limited due to Dyspnea      HPI  Past Medical History:  Diagnosis Date  . Atrial fibrillation ([place])   . CHF (congestive heart failure) ([place])   . Depression   . Diabetes mellitus without complication ([place])   . 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 Hypertension   . Morbid obesity ([place])   . Osteoporosis   . Urinary incontinence   . UTI (lower urinary tract infection) 01/2016   Cipro for Klebsiella pneumoniae isolate  . Wheelchair bound    bedbound    Patient Active Problem List   Diagnosis Date Noted  . Iron deficiency anemia 02/01/2019   . Pressure injury of skin 01/16/2019  . Elevated troponin   . Nausea and vomiting   . Symptomatic bradycardia 01/10/2019  . DKA (diabetic ketoacidoses) ([place]) 01/10/2019  . Urinary incontinence 12/28/2018  . Recurrent urinary tract infection 12/16/2018  . Wound, open, anterior abdominal wall 12/05/2018  . Hypotension due to hypovolemia   . Gastritis due to nonsteroidal anti-inflammatory drug   . Esophageal dysphagia   . GI bleed 09/03/2018  . Coagulopathy ([place])   . Colitis   . Lower abdominal pain   . Rectal bleeding   . Dehydration 08/16/2018  . Acute on chronic diastolic CHF (congestive heart failure) ([place]) 08/10/2018  . Acute lower UTI 08/10/2018  . Hypokalemia 08/10/2018  . COPD (chronic obstructive pulmonary disease) ([place]) 08/10/2018  . Thyroid lesion 08/10/2018  . Closed fracture of right ankle 06/10/2018  . Closed fracture of left tibia and fibula with routine healing 04/13/18 06/10/2018  . Atrial fibrillation ([place]) 04/15/2018  . Chronic diastolic CHF (congestive heart failure) ([place]) 04/15/2018  . CKD (chronic kidney disease), stage III ([place]) 04/15/2018  . Closed right fibular fracture 04/15/2018  . Left tibial fracture 04/14/2018  . CHF exacerbation ([place]) 04/10/2018  . SOB (shortness of breath)   . Acute CHF (congestive heart failure) ([place]) 04/01/2018  . Atrial fibrillation with RVR ([place]) 04/01/2018  . [place] 03/11/2016  . Muscle spasms of lower extremity 03/04/2016  . Acute renal insufficiency 02/26/2016  . History of MRSA infection 02/26/2016  . Diabetes type 2, uncontrolled ([place]) 02/19/2016  . HTN (hypertension) 02/19/2016  . Fracture, femur,  distal ([place]) 02/19/2016  . Fall 02/19/2016  . Depression 02/19/2016  . Closed left femoral fracture, initial encounter 02/19/2016  . Femur fracture, left ([place]) 02/19/2016    Past Surgical History:  Procedure Laterality Date  . ABDOMINAL HYSTERECTOMY    . BIOPSY  09/06/2018   Procedure: BIOPSY;  Surgeon: Danie Binder,  MD;  Location[facility];  Service: Endoscopy;;  gastric bx's  . CESAREAN SECTION    . CHOLECYSTECTOMY    . ESOPHAGEAL DILATION  09/06/2018   Procedure: ESOPHAGEAL DILATION;  Surgeon: Danie Binder, MD;  Location: [facility];  Service: Endoscopy;;  . ESOPHAGOGASTRODUODENOSCOPY (EGD) WITH PROPOFOL N/A 09/06/2018   Procedure: ESOPHAGOGASTRODUODENOSCOPY (EGD) WITH PROPOFOL;  Surgeon: Danie Binder, MD;  Location[facility];  Service: Endoscopy;  Laterality: N/A;  dilatation  . FEMUR IM NAIL Left 02/20/2016  . FEMUR IM NAIL Left 02/20/2016   Procedure: INTRAMEDULLARY (IM) RETROGRADE FEMORAL NAILING;  Surgeon: Leandrew Koyanagi, MD;  Location: [place];  Service: Orthopedics;  Laterality: Left;  . PANCREAS SURGERY  1967   1 cyst excised and one cyst drained  . TEE WITHOUT CARDIOVERSION N/A 04/05/2018   Procedure: TRANSESOPHAGEAL ECHOCARDIOGRAM (TEE);  Surgeon: Dorothy Spark, MD;  Location: [place];  Service: Cardiovascular;  Laterality: N/A;  . [place]    . WRIST FRACTURE SURGERY       OB History   No obstetric history on file.      Home Medications    Prior to Admission medications   Medication Sig Start Date End Date Taking? Authorizing Provider  albuterol (PROVENTIL HFA;VENTOLIN HFA) 108 (90 Base) MCG/ACT inhaler Inhale 2 puffs into the lungs every 6 (six) hours as needed for wheezing or shortness of breath. Patient taking differently: Inhale 2 puffs into the lungs every 6 (six) hours as needed for wheezing or shortness of breath (As needed).  08/14/18  Yes Kathie Dike, MD  apixaban (ELIQUIS) 5 MG TABS tablet Take 1 tablet (5 mg total) by mouth 2 (two) times daily. 07/21/18  Yes Herminio Commons, MD  acetaminophen (TYLENOL) 500 MG tablet Take 500-1,000 mg by mouth every 8 (eight) hours as needed for mild pain or headache.     [provider]  buPROPion (WELLBUTRIN XL) 300 MG 24 hr tablet  01/31/19   [provider]  diltiazem  (CARDIZEM CD) 120 MG 24 hr capsule Take 1 capsule (120 mg total) by mouth daily. Patient taking differently: Take 120 mg by mouth daily. Cartia XT 09/10/18   Johnson, Clanford L, MD  escitalopram (LEXAPRO) 10 MG tablet Take 10 mg by mouth daily. 12/28/18   [provider]  ferrous sulfate (FERROUSUL) 325 (65 FE) MG tablet Take 1 tablet (325 mg total) by mouth 2 (two) times daily with a meal. 01/16/19   Domenic Polite, MD  fluticasone [facility]) 50 MCG/ACT nasal spray Place 2 sprays into both nostrils daily as needed for allergies or rhinitis (congestion).     [provider]  furosemide (LASIX) 40 MG tablet Take 40 mg by mouth daily. 02/08/19   [provider]  glipiZIDE (GLUCOTROL) 10 MG tablet Take 10 mg by mouth 2 (two) times daily. 03/02/18   [provider]  levalbuterol Penne Lash) 0.63 MG/3ML nebulizer solution  02/08/19   [provider]  LEVEMIR FLEXTOUCH 100 UNIT/ML Pen Inject 10 Units into the skin 2 (two) times a day. 01/05/19   [provider]  metoprolol tartrate (LOPRESSOR) 25 MG tablet  Take 1 tablet (25 mg total) by mouth 2 (two) times daily. Patient taking differently: Take 50 mg by mouth every morning.  09/10/18   Murlean Iba, MD  metoprolol tartrate (LOPRESSOR) 50 MG tablet  02/12/19   [provider]  nystatin (MYCOSTATIN/NYSTOP) powder Apply topically daily as needed (rash in skin folds).    [provider]  OVER THE COUNTER MEDICATION Apply 1 application topically daily as needed (rash in skin folds). Baby butt paste    [provider]  pantoprazole (PROTONIX) 40 MG tablet Take 1 tablet (40 mg total) by mouth daily before breakfast. Patient taking differently: Take 40 mg by mouth daily as needed (for GERD/acid reflux).  09/11/18   Johnson, Clanford L, MD  pioglitazone (ACTOS) 45 MG tablet Take 45 mg by mouth daily. 10/30/18   [provider]  senna-docusate (SENOKOT-S) 8.6-50 MG tablet Take 1  tablet by mouth at bedtime as needed for mild constipation. 01/16/19   Domenic Polite, MD  tiotropium (SPIRIVA) 18 MCG inhalation capsule Place 1.25 mcg into inhaler and inhale daily.    [provider]    Family History Family History  Problem Relation Age of Onset  . Diabetes Mother        died in her 56's of a stroke  . Cancer Mother   . Stroke Mother   . Breast cancer Mother   . Diabetes Father        died in his 38's of a stroke.  . Stroke Father   . Diabetes Brother        died @ 57 of a stroke.  . Stroke Brother   . Diabetes Maternal Grandmother   . Diabetes Maternal Grandfather   . Diabetes Paternal Grandmother   . Diabetes Paternal Grandfather   . Breast cancer Maternal Aunt     Social History Social History   Tobacco Use  . Smoking status: Never Smoker  . Smokeless tobacco: Never Used  Substance Use Topics  . Alcohol use: No  . Drug use: No     Allergies   Propranolol, Topamax [topiramate], and Latex   Review of Systems Review of Systems  Ten systems reviewed and are negative for acute change, except as noted in the HPI.   Physical Exam Updated Vital Signs BP (!) 214/78   Pulse 61   Temp 97.8 F (36.6 C) (Oral)   Resp (!) 26   Ht 5' 4"  (1.626 m)   Wt 102.5 kg   SpO2 100%   BMI 38.79 kg/m   Physical Exam Vitals signs and nursing note reviewed.  Constitutional:      Appearance: She is well-developed. She is obese. She is not diaphoretic.  HENT:     Head: Normocephalic and atraumatic.  Eyes:     General: No scleral icterus.    Conjunctiva/sclera: Conjunctivae normal.  Neck:     Musculoskeletal: Normal range of motion.  Cardiovascular:     Rate and Rhythm: Normal rate and regular rhythm.     Heart sounds: Normal heart sounds. No murmur. No friction rub. No gallop.   Pulmonary:     Effort: Tachypnea present.     Breath sounds: Rales present.  Abdominal:     General: Bowel sounds are normal. There is no distension.      Palpations: Abdomen is soft. There is no mass.     Tenderness: There is no abdominal tenderness. There is no guarding.  Skin:    General: Skin is warm and  dry.  Neurological:     Mental Status: She is alert and oriented to person, place, and time.  Psychiatric:        Behavior: Behavior normal.    .  ED Treatments / Results  Labs (all labs ordered are listed, but only abnormal results are displayed) Labs Reviewed  SARS CORONAVIRUS 2 ([facility])  BRAIN NATRIURETIC PEPTIDE  CBC WITH DIFFERENTIAL/PLATELET  BLOOD GAS, VENOUS    EKG None  Radiology No results found.  Procedures .Critical Care Performed by: Margarita Mail, PA-C Authorized by: Margarita Mail, PA-C   Critical care provider statement:    Critical care time (minutes):  50   Critical care was necessary to treat or prevent imminent or life-threatening deterioration of the following conditions:  Circulatory failure and respiratory failure   Critical care was time spent personally by me on the following activities:  Discussions with consultants, evaluation of patient's response to treatment, examination of patient, ordering and performing treatments and interventions, ordering and review of laboratory studies, ordering and review of radiographic studies, pulse oximetry, re-evaluation of patient's condition, obtaining history from patient or surrogate and review of old charts   (including critical care time)  Medications Ordered in ED Medications  nitroGLYCERIN 50 mg in dextrose 5 % 250 mL (0.2 mg/mL) infusion (5 mcg/min Intravenous New Bag/Given 02/17/19 1522)  ondansetron (ZOFRAN) injection 4 mg (4 mg Intravenous Given 02/17/19 1500)     Initial Impression / Assessment and Plan / ED Course  I have reviewed the triage vital signs and the nursing notes.  Pertinent labs & imaging results that were available during my care of the patient were reviewed by me and considered in  my medical decision making (see chart for details).  Clinical Course as of Feb 16 1538  Thu Feb 17, 2019  1537 WBC(!): 27.1 [AH]    Clinical Course User Index [AH] Margarita Mail, PA-C       KJ:IZXYOFVW reaction VS: BP (!) 150/72   Pulse 60   Temp 97.8 F (36.6 C) (Oral)   Resp (!) 24   Ht 5' 4"  (1.626 m)   Wt 102.5 kg   SpO2 95%   BMI 38.79 kg/m   AQ:LRJPVGK is gathered by patient and emr. DDX:The emergent differential diagnosis for shortness of breath includes, but is not limited to, Pulmonary edema, bronchoconstriction, Pneumonia, Pulmonary embolism, Pneumotherax/ Hemothorax, Dysrythmia, ACS.  Labs: I reviewed the labs which shows LUkocytosis,  markedly elevated BnP, negative coronavirus Imaging: I personally reviewed the images (1 view chest x-ray) which show(s) pulmonary edema EKG:Marland Kitchen MDM: Patient with marked Volume overload consistent with transfusion associated circulatory overload reaction.  She had significantly elevated hypertension which is treated with nitroglycerin drip and after return of her chest x-ray patient was started on Lasix with improvement in her breathing status.  Patient disposition: Admit Patient condition: Stable. The patient appears reasonably stabilized for admission considering the current resources, flow, and capabilities available in the ED at this time, and I doubt any other [facility] requiring further screening and/or treatment in the ED prior to admission.   Final Clinical Impressions(s) / ED Diagnoses   Final diagnoses:  None    ED Discharge Orders    None       Margarita Mail, PA-C 02/17/19 1722    Daleen Bo, MD 02/17/19 2326

## 2019-02-17 NOTE — Progress Notes (Signed)
13:15 Pt yells out. Upon entering room pt saying her back has a catch in it.  13:16 Iron stopped. RNester NP and RLockamy NP at the bedside. VO received.   13:16 NS hung. VO received by RNester NP  13:16 Benadryl 77m IV given.  RNester NP and RLockamy NP at the bedside. VO received. VS obtained.   13:19 Solu-medrol administered 1282mper IV push.   13:22 Normal Saline rate decreased to 25063m hr VO received RNester NP.   13:28 Pt states , " I feel fine now." Back pain is better per patient's words.   13:32 Pt using cell phone and states, " I am not hurting anywhere."   13:50 Per RNester NP upon entering room to check on patient the patients states, " I am having chest pain."   13:56 VO received to give 125m55m Solu-Medrol IV push now.   14:00 [facility]

## 2019-02-17 NOTE — ED Provider Notes (Signed)
  Face-to-face evaluation   History: She presents for evaluation of suspected infusion reaction to iron, third dose.  She was referred after breathing was treated with Solu-Medrol, and Benadryl.  Patient is unable to give any history.  Physical exam: She is obese, alert and cooperative.  She is confused.  Heart regular rate and rhythm without murmur.  Lungs clear anteriorly.  No overt respiratory distress.  No peripheral edema.  Soft and nontender.  Initial management-with moderate hypertension, nitroglycerin drip ordered.  Will stabilize respiratory status, prior to consideration of disposition.  Medical screening examination/treatment/procedure(s) were conducted as a shared visit with non-physician practitioner(s) and myself.  I personally evaluated the patient during the encounter    Daleen Bo, MD 02/17/19 2327

## 2019-02-17 NOTE — ED Triage Notes (Signed)
Pt brought down from [facility] for a transfusion reaction. Pt was receiving an iron infusion and began having back pain. Pt was then given Solu-medrol and benadryl. Staff noted patient became very hypertensive and bradycardic. Initial HR was in the 50s, but she went down into the 30s for about 5 minutes. Pt never became unresponsive. Pt AOx4, but does endorse SOB at this time.

## 2019-02-18 DIAGNOSIS — Z90710 Acquired absence of both cervix and uterus: Secondary | ICD-10-CM | POA: Diagnosis not present

## 2019-02-18 DIAGNOSIS — E1122 Type 2 diabetes mellitus with diabetic chronic kidney disease: Secondary | ICD-10-CM | POA: Diagnosis present

## 2019-02-18 DIAGNOSIS — Z20828 Contact with and (suspected) exposure to other viral communicable diseases: Secondary | ICD-10-CM | POA: Diagnosis present

## 2019-02-18 DIAGNOSIS — E1165 Type 2 diabetes mellitus with hyperglycemia: Secondary | ICD-10-CM | POA: Diagnosis present

## 2019-02-18 DIAGNOSIS — N183 Chronic kidney disease, stage 3 (moderate): Secondary | ICD-10-CM | POA: Diagnosis present

## 2019-02-18 DIAGNOSIS — D509 Iron deficiency anemia, unspecified: Secondary | ICD-10-CM | POA: Diagnosis present

## 2019-02-18 DIAGNOSIS — Z91040 Latex allergy status: Secondary | ICD-10-CM | POA: Diagnosis not present

## 2019-02-18 DIAGNOSIS — Z9049 Acquired absence of other specified parts of digestive tract: Secondary | ICD-10-CM | POA: Diagnosis not present

## 2019-02-18 DIAGNOSIS — I4891 Unspecified atrial fibrillation: Secondary | ICD-10-CM | POA: Diagnosis present

## 2019-02-18 DIAGNOSIS — Z7901 Long term (current) use of anticoagulants: Secondary | ICD-10-CM | POA: Diagnosis not present

## 2019-02-18 DIAGNOSIS — M810 Age-related osteoporosis without current pathological fracture: Secondary | ICD-10-CM | POA: Diagnosis present

## 2019-02-18 DIAGNOSIS — L98499 Non-pressure chronic ulcer of skin of other sites with unspecified severity: Secondary | ICD-10-CM | POA: Diagnosis present

## 2019-02-18 DIAGNOSIS — Z888 Allergy status to other drugs, medicaments and biological substances status: Secondary | ICD-10-CM | POA: Diagnosis not present

## 2019-02-18 DIAGNOSIS — E8771 Transfusion associated circulatory overload: Secondary | ICD-10-CM | POA: Diagnosis present

## 2019-02-18 DIAGNOSIS — L98492 Non-pressure chronic ulcer of skin of other sites with fat layer exposed: Secondary | ICD-10-CM

## 2019-02-18 DIAGNOSIS — J449 Chronic obstructive pulmonary disease, unspecified: Secondary | ICD-10-CM | POA: Diagnosis present

## 2019-02-18 DIAGNOSIS — F329 Major depressive disorder, single episode, unspecified: Secondary | ICD-10-CM | POA: Diagnosis present

## 2019-02-18 DIAGNOSIS — Y848 Other medical procedures as the cause of abnormal reaction of the patient, or of later complication, without mention of misadventure at the time of the procedure: Secondary | ICD-10-CM | POA: Diagnosis present

## 2019-02-18 DIAGNOSIS — I130 Hypertensive heart and chronic kidney disease with heart failure and stage 1 through stage 4 chronic kidney disease, or unspecified chronic kidney disease: Secondary | ICD-10-CM | POA: Diagnosis present

## 2019-02-18 DIAGNOSIS — J810 Acute pulmonary edema: Secondary | ICD-10-CM | POA: Diagnosis not present

## 2019-02-18 DIAGNOSIS — Z6838 Body mass index (BMI) 38.0-38.9, adult: Secondary | ICD-10-CM | POA: Diagnosis not present

## 2019-02-18 DIAGNOSIS — E11622 Type 2 diabetes mellitus with other skin ulcer: Secondary | ICD-10-CM | POA: Diagnosis present

## 2019-02-18 DIAGNOSIS — I5033 Acute on chronic diastolic (congestive) heart failure: Secondary | ICD-10-CM | POA: Diagnosis present

## 2019-02-18 DIAGNOSIS — Z993 Dependence on wheelchair: Secondary | ICD-10-CM | POA: Diagnosis not present

## 2019-02-18 DIAGNOSIS — Z87440 Personal history of urinary (tract) infections: Secondary | ICD-10-CM | POA: Diagnosis not present

## 2019-02-18 DIAGNOSIS — Z79899 Other long term (current) drug therapy: Secondary | ICD-10-CM | POA: Diagnosis not present

## 2019-02-18 LAB — BASIC METABOLIC PANEL
Anion gap: 8 (ref 5–15)
BUN: 22 mg/dL (ref 8–23)
CO2: 26 mmol/L (ref 22–32)
Calcium: 8.6 mg/dL — ABNORMAL LOW (ref 8.9–10.3)
Chloride: 103 mmol/L (ref 98–111)
Creatinine, Ser: 0.74 mg/dL (ref 0.44–1.00)
GFR calc Af Amer: >60 mL/min (ref >60)
GFR calc non Af Amer: >60 mL/min (ref >60)
Glucose, Bld: 209 mg/dL — ABNORMAL HIGH (ref 70–99)
Potassium: 4.2 mmol/L (ref 3.5–5.1)
Sodium: 137 mmol/L (ref 135–145)

## 2019-02-18 LAB — GLUCOSE, CAPILLARY
Glucose-Capillary: 223 mg/dL — ABNORMAL HIGH (ref 70–99)
Glucose-Capillary: 147 mg/dL — ABNORMAL HIGH (ref 70–99)
Glucose-Capillary: 147 mg/dL — ABNORMAL HIGH (ref 70–99)
Glucose-Capillary: 145 mg/dL — ABNORMAL HIGH (ref 70–99)

## 2019-02-18 LAB — TROPONIN I (HIGH SENSITIVITY): Troponin I (High Sensitivity): 12 ng/L (ref <18)

## 2019-02-18 MED ORDER — FUROSEMIDE 10 MG/ML IJ SOLN
40.0000 mg | Freq: Two times a day (BID) | INTRAMUSCULAR | Status: DC
  Administered 2019-02-18: 21:00:00 40 mg via INTRAVENOUS
  Filled 2019-02-18: qty 4

## 2019-02-18 MED ORDER — FUROSEMIDE 10 MG/ML IJ SOLN
40.0000 mg | Freq: Two times a day (BID) | INTRAMUSCULAR | Status: DC
  Administered 2019-02-18: 40 mg via INTRAVENOUS
  Filled 2019-02-18: qty 4

## 2019-02-18 NOTE — Progress Notes (Signed)
PROGRESS NOTE    [name]  OZD:[id number] DOB: [birth year] DOA: 02/17/2019 PCP: [name], MD   Brief Narrative:  Per HPI: [name] is a 68 y.o. female with medical history significant of his working atrial fibrillation, LV thrombus, diastolic CHF, hypertension, depression, type 2 diabetes, morbid obesity, osteoporosis, urinary incontinence, history of Klebsiella pneumonia UTI who was brought from the cancer center after developing an iron infusion reaction.  She was given Solu-Medrol and Benadryl.  She is states that shortly after the infusion was started she developed pain between her shoulder blades and mild chest pressure.  This was associated with palpitations, mild diaphoresis and progressively worse dyspnea.  She denies fever, chills, sore throat, but states she was coughing whitish sputum earlier.  She also has mild lower extremity edema.  She has occasional nausea, but denies abdominal pain, recent emesis, diarrhea, constipation, melena or hematochezia.  No dysuria, frequency or hematuria.  Denies polyuria, polydipsia, polyphagia.  Patient was admitted with acute pulmonary edema secondary to acute on chronic diastolic CHF exacerbation.  She was noted to have atrial fibrillation with rapid ventricular rate on admission and this was suspected to be secondary to possible Feraheme transfusion.  She was started on nitroglycerin infusion and was given 1 dose of IV Lasix.  She appears to be doing better this morning.  Assessment & Plan:   Principal Problem:   Acute pulmonary edema (HCC) Active Problems:   Diabetes type 2, uncontrolled (HCC)   HTN (hypertension)   Depression   Atrial fibrillation (HCC)   Chronic diastolic CHF (congestive heart failure) (HCC)   CKD (chronic kidney disease), stage III (HCC)   COPD (chronic obstructive pulmonary disease) (HCC)   Iron deficiency anemia   Acute pulmonary edema (HCC)   Chronic diastolic CHF (congestive heart failure)  ([place]) Transfer to telemetry Discontinue nitroglycerin infusion Wean oxygen to off Lasix 40 mg IV push twice daily Monitor and correct electrolytes as needed. Monitor daily weights, intake and output.  Active Problems:   Diabetes type 2, uncontrolled (HCC) -stable Hemoglobin A1c 7.7% on 12/05/2018. Carbohydrate modified diet. Continue Levemir 10 units SQ twice daily. Continue glipizide 10 mg p.o. twice daily. Continue Actos 45 mg p.o. daily. CBG monitoring with regular insulin sliding scale. Expect blood glucose to be high her due to earlier Solu-Medrol administration.    HTN (hypertension) Monitor blood pressure. Currently on furosemide 40 mg IVP twice daily. Continue metoprolol and Cardizem    Depression Continue Lexapro 10 mg p.o. daily. Continue Wellbutrin 300 mg p.o. daily.    Atrial fibrillation (HCC)-converted to sinus rhythm CHA?DS?-VASc Score of at least 5. Maintain on Cardizem and beta-blocker monitor on telemetry, okay for transfer to telemetry Continue apixaban.    CKD (chronic kidney disease), stage III (HCC)-stable Monitor renal function electrolytes.    COPD (chronic obstructive pulmonary disease) (HCC) No wheezing. Continue supplemental oxygen and Xopenex.    Iron deficiency anemia Continue oral iron supplementation. Monitor H&H which has decreased-repeat CBC in a.m.   DVT prophylaxis: Eliquis Code Status: Full Family Communication: None at bedside Disposition Plan: Continue Lasix for diuresis and stop nitroglycerin.  Transfer to telemetry.  Wean oxygen.  General surgery consultation for biopsy of multiple open abdominal wounds.  Patient requires inpatient stay due to ongoing need for IV diuresis and weaning of oxygen.  Consultants:   General surgery  Procedures:   None  Antimicrobials:   None   Subjective: Patient seen and evaluated today with no new acute complaints or concerns.  No acute concerns or events noted overnight.  She  appears to be doing better this morning and is not having any significant symptomatology.  She remains on 3 L nasal cannula and still is not quite at baseline.  Objective: Vitals:   02/18/19 0600 02/18/19 0615 02/18/19 0630 02/18/19 0645  BP: (!) 145/51 (!) 151/55 (!) 131/106 (!) 138/46  Pulse: (!) 57 60 60 64  Resp: 18 (!) 32 16 18  Temp:      TempSrc:      SpO2: 100% 100% 100% 100%  Weight:      Height:        Intake/Output Summary (Last 24 hours) at 02/18/2019 0750 Last data filed at 02/18/2019 0300 Gross per 24 hour  Intake 37.99 ml  Output -  Net 37.99 ml   Filed Weights   02/17/19 1417 02/17/19 1851 02/18/19 0500  Weight: 102.5 kg 105.4 kg 103.1 kg    Examination:  General exam: Appears calm and comfortable, obese Respiratory system: Clear to auscultation. Respiratory effort normal.  Currently on 3 L nasal cannula. Cardiovascular system: S1 & S2 heard, RRR. No JVD, murmurs, rubs, gallops or clicks. No pedal edema. Gastrointestinal system: Abdomen is nondistended, soft and nontender. No organomegaly or masses felt. Normal bowel sounds heard.  Abdominal wounds noted to right lower quadrant with dressing clean dry and intact.  Open areas with no active drainage. Central nervous system: Alert and oriented. No focal neurological deficits. Extremities: Symmetric 5 x 5 power. Skin: No rashes, lesions or ulcers Psychiatry: Judgement and insight appear normal. Mood & affect appropriate.     Data Reviewed: I have personally reviewed following labs and imaging studies  CBC: Recent Labs  Lab 02/17/19 1522 02/17/19 2143  WBC 27.1* 17.7*  NEUTROABS 25.2*  --   HGB 10.4* 8.5*  HCT 36.7 29.0*  MCV 91.5 89.0  PLT 346 841   Basic Metabolic Panel: Recent Labs  Lab 02/17/19 1522 02/18/19 0531  NA  --  137  K  --  4.2  CL  --  103  CO2  --  26  GLUCOSE  --  209*  BUN  --  22  CREATININE  --  0.74  CALCIUM  --  8.6*  MG 1.5*  --    GFR: Estimated Creatinine  Clearance: 78.7 mL/min (by C-G formula based on SCr of 0.74 mg/dL). Liver Function Tests: No results for input(s): AST, ALT, ALKPHOS, BILITOT, PROT, ALBUMIN in the last 168 hours. No results for input(s): LIPASE, AMYLASE in the last 168 hours. No results for input(s): AMMONIA in the last 168 hours. Coagulation Profile: No results for input(s): INR, PROTIME in the last 168 hours. Cardiac Enzymes: No results for input(s): CKTOTAL, CKMB, CKMBINDEX, TROPONINI in the last 168 hours. BNP (last 3 results) No results for input(s): PROBNP in the last 8760 hours. HbA1C: No results for input(s): HGBA1C in the last 72 hours. CBG: Recent Labs  Lab 02/17/19 2221  GLUCAP 217*   Lipid Profile: No results for input(s): CHOL, HDL, LDLCALC, TRIG, CHOLHDL, LDLDIRECT in the last 72 hours. Thyroid Function Tests: No results for input(s): TSH, T4TOTAL, FREET4, T3FREE, THYROIDAB in the last 72 hours. Anemia Panel: No results for input(s): VITAMINB12, FOLATE, FERRITIN, TIBC, IRON, RETICCTPCT in the last 72 hours. Sepsis Labs: No results for input(s): PROCALCITON, LATICACIDVEN in the last 168 hours.  Recent Results (from the past 240 hour(s))  SARS Coronavirus 2 (CEPHEID- Performed in [facility] hospital lab), [facility]  Status: None   Collection Time: 02/17/19  3:07 PM   Specimen: Nasopharyngeal Swab  Result Value Ref Range Status   SARS Coronavirus 2 NEGATIVE NEGATIVE Final    Comment: (NOTE) If result is NEGATIVE SARS-CoV-2 target nucleic acids are NOT DETECTED. The SARS-CoV-2 RNA is generally detectable in upper and lower  respiratory specimens during the acute phase of infection. The lowest  concentration of SARS-CoV-2 viral copies this assay can detect is 250  copies / mL. A negative result does not preclude SARS-CoV-2 infection  and should not be used as the sole basis for treatment or other  patient management decisions.  A negative result may occur with  improper specimen collection /  handling, submission of specimen other  than nasopharyngeal swab, presence of viral mutation(s) within the  areas targeted by this assay, and inadequate number of viral copies  (<250 copies / mL). A negative result must be combined with clinical  observations, patient history, and epidemiological information. If result is POSITIVE SARS-CoV-2 target nucleic acids are DETECTED. The SARS-CoV-2 RNA is generally detectable in upper and lower  respiratory specimens dur ing the acute phase of infection.  Positive  results are indicative of active infection with SARS-CoV-2.  Clinical  correlation with patient history and other diagnostic information is  necessary to determine patient infection status.  Positive results do  not rule out bacterial infection or co-infection with other viruses. If result is PRESUMPTIVE POSTIVE SARS-CoV-2 nucleic acids MAY BE PRESENT.   A presumptive positive result was obtained on the submitted specimen  and confirmed on repeat testing.  While 2019 novel coronavirus  (SARS-CoV-2) nucleic acids may be present in the submitted sample  additional confirmatory testing may be necessary for epidemiological  and / or clinical management purposes  to differentiate between  SARS-CoV-2 and other Sarbecovirus currently known to infect humans.  If clinically indicated additional testing with an alternate test  methodology [phone]) is advised. The SARS-CoV-2 RNA is generally  detectable in upper and lower respiratory sp ecimens during the acute  phase of infection. The expected result is Negative. Fact Sheet for Patients:  StrictlyIdeas.no Fact Sheet for Healthcare Providers: BankingDealers.co.za This test is not yet approved or cleared by the [place] FDA and has been authorized for detection and/or diagnosis of SARS-CoV-2 by FDA under an Emergency Use Authorization (EUA).  This EUA will remain in effect (meaning this  test can be used) for the duration of the COVID-19 declaration under Section 564(b)(1) of the Act, 21 U.S.C. section 360bbb-3(b)(1), unless the authorization is terminated or revoked sooner. Performed at [facility], [address] [place], [place] [address]   MRSA PCR Screening     Status: None   Collection Time: 02/17/19  6:47 PM   Specimen: Nasal Mucosa; Nasopharyngeal  Result Value Ref Range Status   MRSA by PCR NEGATIVE NEGATIVE Final    Comment:        The GeneXpert MRSA Assay (FDA approved for NASAL specimens only), is one component of a comprehensive MRSA colonization surveillance program. It is not intended to diagnose MRSA infection nor to guide or monitor treatment for MRSA infections. Performed at [facility], [address] [place], [place] [address]          Radiology Studies: Dg Chest [facility] 1 View  Result Date: 02/17/2019 CLINICAL DATA:  Shortness of breath. EXAM: PORTABLE CHEST 1 VIEW COMPARISON:  Radiograph Jan 10, 2019. FINDINGS: Stable cardiomediastinal silhouette. Increased central pulmonary vascular congestion is noted with possible  bilateral pulmonary edema. No pneumothorax or pleural effusion is noted. Bony thorax is unremarkable. IMPRESSION: Increased central pulmonary vascular congestion is noted with possible bilateral pulmonary edema. Electronically Signed   By: Marijo Conception M.D.   On: 02/17/2019 15:49        Scheduled Meds: . apixaban  5 mg Oral BID  . buPROPion  300 mg Oral Daily  . Chlorhexidine Gluconate Cloth  6 each Topical Daily  . diltiazem  120 mg Oral Daily  . escitalopram  10 mg Oral Daily  . ferrous sulfate  325 mg Oral BID WC  . furosemide  40 mg Intravenous BID  . furosemide  40 mg Intravenous Q12H  . glipiZIDE  10 mg Oral BID AC  . insulin aspart  0-20 Units Subcutaneous TID WC  . insulin detemir  10 Units Subcutaneous BID  . lisinopril  2.5 mg Oral Daily  . mouth rinse  15 mL Mouth Rinse BID  . metoprolol tartrate  50  mg Oral BID  . pantoprazole  40 mg Oral QAC breakfast  . pioglitazone  45 mg Oral Daily   Continuous Infusions:   LOS: 0 days    Time spent: 30 minutes    Joellen Tullos Darleen Crocker, DO [facility] Pager [phone]  If 7PM-7AM, please contact night-coverage [email or web address] Password [email or web address] 02/18/2019, 7:50 AM

## 2019-02-18 NOTE — TOC Initial Note (Signed)
Transition of Care [facility]) - Initial/Assessment Note    Patient Details  Name: [name] MRN: [id number] Date of Birth: 1950/10/20  [facility]) CM/SW Contact:    Ihor Gully, LCSW Phone Number: 02/18/2019, 3:18 PM  Clinical Narrative:                 PTA from home with the support of spouse. Patient was previously active with [facility]. Plan to return home once stable. Kerri at [facility] confirmed that patient remains active with RN.  Advised that patient will need wound care at discharge.  TOC will follow through discharge for additional needs.   Expected Discharge Plan: [place] Barriers to Discharge: No Barriers Identified   Patient Goals and CMS Choice   CMS Medicare.gov Compare Post Acute Care list provided to:: Patient Choice offered to / list presented to : Patient  Expected Discharge Plan and Services Expected Discharge Plan: [place] Choice: Durable Medical Equipment Living arrangements for the past 2 months: Single Family Home                                      Prior Living Arrangements/Services Living arrangements for the past 2 months: Single Family Home Lives with:: Spouse Patient language and need for interpreter reviewed:: Yes Do you feel safe going back to the place where you live?: No      Need for Family Participation in Patient Care: Yes (Comment) Care giver support system in place?: Yes (comment) Current home services: DME, Home RN Criminal Activity/Legal Involvement Pertinent to Current Situation/Hospitalization: No - Comment as needed  Activities of Daily Living Home Assistive Devices/Equipment: Scales, Walker (specify type), Wheelchair, Radio producer (specify quad or straight), Blood pressure cuff, CBG Meter, Eyeglasses, Nebulizer ADL Screening (condition at time of admission) Patient's cognitive ability adequate to safely complete daily activities?: Yes Is the patient deaf or  have difficulty hearing?: No Does the patient have difficulty seeing, even when wearing glasses/contacts?: No Does the patient have difficulty concentrating, remembering, or making decisions?: No Patient able to express need for assistance with ADLs?: Yes Does the patient have difficulty dressing or bathing?: No Independently performs ADLs?: Yes (appropriate for developmental age) Does the patient have difficulty walking or climbing stairs?: Yes Weakness of Legs: Both Weakness of Arms/Hands: None  Permission Sought/Granted                  Emotional Assessment Appearance:: Appears stated age   Affect (typically observed): Unable to Assess Orientation: : Oriented to Self, Oriented to Place, Oriented to  Time, Oriented to Situation Alcohol / Substance Use: Not Applicable Psych Involvement: No (comment)  Admission diagnosis:  TACO (transfusion associated circulatory overload) [E87.71] Patient Active Problem List   Diagnosis Date Noted  . Acute pulmonary edema ([place]) 02/17/2019  . Iron deficiency anemia 02/01/2019  . Skin ulcer of abdomen with fat layer exposed ([place]) 01/16/2019  . Elevated troponin   . Nausea and vomiting   . Symptomatic bradycardia 01/10/2019  . DKA (diabetic ketoacidoses) ([place]) 01/10/2019  . Urinary incontinence 12/28/2018  . Recurrent urinary tract infection 12/16/2018  . Wound, open, anterior abdominal wall 12/05/2018  . Hypotension due to hypovolemia   . Gastritis due to nonsteroidal anti-inflammatory drug   . Esophageal dysphagia   . GI bleed 09/03/2018  . Coagulopathy ([place])   .  Colitis   . Lower abdominal pain   . Rectal bleeding   . Dehydration 08/16/2018  . Acute on chronic diastolic CHF (congestive heart failure) ([place]) 08/10/2018  . Acute lower UTI 08/10/2018  . Hypokalemia 08/10/2018  . COPD (chronic obstructive pulmonary disease) ([place]) 08/10/2018  . Thyroid lesion 08/10/2018  . Closed fracture of right ankle 06/10/2018  . Closed fracture  of left tibia and fibula with routine healing 04/13/18 06/10/2018  . Atrial fibrillation ([place]) 04/15/2018  . Chronic diastolic CHF (congestive heart failure) ([place]) 04/15/2018  . CKD (chronic kidney disease), stage III ([place]) 04/15/2018  . Closed right fibular fracture 04/15/2018  . Left tibial fracture 04/14/2018  . CHF exacerbation ([place]) 04/10/2018  . SOB (shortness of breath)   . Acute CHF (congestive heart failure) ([place]) 04/01/2018  . Atrial fibrillation with RVR ([place]) 04/01/2018  . [place] 03/11/2016  . Muscle spasms of lower extremity 03/04/2016  . Acute renal insufficiency 02/26/2016  . History of MRSA infection 02/26/2016  . Diabetes type 2, uncontrolled ([place]) 02/19/2016  . HTN (hypertension) 02/19/2016  . Fracture, femur, distal ([place]) 02/19/2016  . Fall 02/19/2016  . Depression 02/19/2016  . Closed left femoral fracture, initial encounter 02/19/2016  . Femur fracture, left ([place]) 02/19/2016   PCP:  [name], MD Pharmacy:   [facility] [address] [place] [address] [place] [address] Phone: [phone] Fax: [phone]  [id number] [id number]: [phone]: [phone]    Social Determinants of Health (SDOH) Interventions    Readmission Risk Interventions Readmission Risk Prevention Plan 01/14/2019  Transportation Screening Complete  Medication Review (Press photographer Complete  HRI or [place]  SW Recovery Care/Counseling Consult Complete  [place] Applicable  Some recent data might be hidden

## 2019-02-18 NOTE — Consult Note (Signed)
[place]  Reason for Consult: Open abdominal wound  Referring Physician: Dr. Manuella Ghazi   Chief Complaint    Transfusion Reaction      [name] is a 68 y.o. female.  HPI: [name] is a 68 yo with mulitple medical issues including A fib, CHF, DM, HTN, iron deficiency who came to the hospital after receiving an iron transfusion in the [place] yesterday and having a reaction. She says she started having chest pain that went to her back yesterday and stayed between her shoulders.  She had some shortness of breath and some diaphoresis with this event yesterday. On my arrival to her room she is complaining of pain in the center of her chest going to her back. She says she does not know if this is anxiety, reflux or something else. She says the pain jumps to her left arm. She denies any prior cardiac event.  She says that she had been anxious about the biopsy on her skin wounds today.  She says that the wounds have been present since Thanksgiving and that they have been getting worse. She has had her PCP look at the area, and was told to use nystatin.  Her PCP Sent her to [facility] for her UTI and the wounds, but she says ID was only concerned about the UTI and chronic antibiotic use.  She now has three wounds on the right lateral pannus. She cannot see the wounds. She lives at home with her husband and grand daughter. She does not ambulate but stays in a wheel chair and can transfer to chairs or the toilet with a walker.   Once the patient notified me of the pain, I let Dr. Manuella Ghazi know given the risk of a cardiac event and possible aortic dissection with the pain in the back. He has ordered EKG, Troponin and plans to order a CTA.    Past Medical History:  Diagnosis Date  . Atrial fibrillation ([place])   . CHF (congestive heart failure) ([place])   . Depression   . Diabetes mellitus without complication ([place])   . 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 Hypertension   . Morbid obesity ([place])   . Osteoporosis   . Urinary incontinence   . UTI (lower urinary tract infection) 01/2016   Cipro for Klebsiella pneumoniae isolate  . Wheelchair bound    bedbound    Past Surgical History:  Procedure Laterality Date  . ABDOMINAL HYSTERECTOMY    . BIOPSY  09/06/2018   Procedure: BIOPSY;  Surgeon: Danie Binder, MD;  [facility];  Service: Endoscopy;;  gastric bx's  . CESAREAN SECTION    . CHOLECYSTECTOMY    . ESOPHAGEAL DILATION  09/06/2018   Procedure: ESOPHAGEAL DILATION;  Surgeon: Danie Binder, MD;  [facility];  Service: Endoscopy;;  . ESOPHAGOGASTRODUODENOSCOPY (EGD) WITH PROPOFOL N/A 09/06/2018   Procedure: ESOPHAGOGASTRODUODENOSCOPY (EGD) WITH PROPOFOL;  Surgeon: Danie Binder, MD;  [facility];  Service: Endoscopy;  Laterality: N/A;  dilatation  . FEMUR IM NAIL Left 02/20/2016  . FEMUR IM NAIL Left 02/20/2016   Procedure: INTRAMEDULLARY (IM) RETROGRADE FEMORAL NAILING;  Surgeon: Leandrew Koyanagi, MD;  Location: [facility];  Service: Orthopedics;  Laterality: Left;  . PANCREAS SURGERY  1967   1 cyst excised and one cyst drained  . TEE WITHOUT CARDIOVERSION N/A 04/05/2018   Procedure: TRANSESOPHAGEAL ECHOCARDIOGRAM (TEE);  Surgeon: Dorothy Spark, MD;  [facility];  Service: Cardiovascular;  Laterality: N/A;  . [place]    . WRIST FRACTURE SURGERY      Family History  Problem Relation Age of Onset  . Diabetes Mother        died in her 48's of a stroke  . Cancer Mother   . Stroke Mother   . Breast cancer Mother   . Diabetes Father        died in his 12's of a stroke.  . Stroke Father   . Diabetes Brother        died @ 96 of a stroke.  . Stroke Brother   . Diabetes Maternal Grandmother   . Diabetes Maternal Grandfather   . Diabetes Paternal Grandmother   . Diabetes Paternal Grandfather   . Breast cancer Maternal Aunt     Social History   Tobacco Use   . Smoking status: Never Smoker  . Smokeless tobacco: Never Used  Substance Use Topics  . Alcohol use: No  . Drug use: No    Medications:  I have reviewed the patient's current medications. Prior to Admission:  Medications Prior to Admission  Medication Sig Dispense Refill Last Dose  . acetaminophen (TYLENOL) 500 MG tablet Take 500-1,000 mg by mouth every 8 (eight) hours as needed for mild pain or headache.    02/17/2019 at Unknown time  . albuterol (PROVENTIL HFA;VENTOLIN HFA) 108 (90 Base) MCG/ACT inhaler Inhale 2 puffs into the lungs every 6 (six) hours as needed for wheezing or shortness of breath. (Patient taking differently: Inhale 2 puffs into the lungs every 6 (six) hours as needed for wheezing or shortness of breath (As needed). ) 1 Inhaler 2 02/17/2019 at Unknown time  . apixaban (ELIQUIS) 5 MG TABS tablet Take 1 tablet (5 mg total) by mouth 2 (two) times daily. 60 tablet 3 02/17/2019 at ?  Marland Kitchen buPROPion (WELLBUTRIN XL) 300 MG 24 hr tablet Take 300 mg by mouth daily.    02/17/2019 at Unknown time  . diltiazem (CARDIZEM CD) 120 MG 24 hr capsule Take 1 capsule (120 mg total) by mouth daily. (Patient taking differently: Take 120 mg by mouth daily. Cartia XT)   02/17/2019 at Unknown time  . escitalopram (LEXAPRO) 10 MG tablet Take 10 mg by mouth daily.   02/17/2019 at Unknown time  . ferrous sulfate (FERROUSUL) 325 (65 FE) MG tablet Take 1 tablet (325 mg total) by mouth 2 (two) times daily with a meal. 60 tablet 0 02/17/2019 at Unknown time  . fluticasone (FLONASE) 50 MCG/ACT nasal spray Place 2 sprays into both nostrils daily as needed for allergies or rhinitis (congestion).    Past Week at Unknown time  . furosemide (LASIX) 40 MG tablet Take 40 mg by mouth daily.   02/17/2019 at Unknown time  . glipiZIDE (GLUCOTROL) 10 MG tablet Take 10 mg by mouth 2 (two) times daily.  2 02/17/2019 at Unknown time  . levalbuterol (XOPENEX) 0.63 MG/3ML nebulizer solution Take 0.63 mg by nebulization every 6 (six) hours as  needed for wheezing or shortness of breath.    02/17/2019 at Unknown time  . LEVEMIR FLEXTOUCH 100 UNIT/ML Pen Inject 10 Units into the skin 2 (two) times a day.   02/17/2019 at Unknown time  . metoprolol tartrate (LOPRESSOR) 50 MG tablet Take 50 mg by mouth 2 (two) times daily.    02/17/2019 at Unknown time  . nystatin (MYCOSTATIN/NYSTOP) powder Apply topically daily as needed (rash in skin folds).  02/17/2019 at Unknown time  . OVER THE COUNTER MEDICATION Apply 1 application topically daily as needed (rash in skin folds). Baby butt paste     . pantoprazole (PROTONIX) 40 MG tablet Take 1 tablet (40 mg total) by mouth daily before breakfast. (Patient taking differently: Take 40 mg by mouth daily as needed (for GERD/acid reflux). )   02/17/2019 at Unknown time  . pioglitazone (ACTOS) 45 MG tablet Take 45 mg by mouth daily.   02/17/2019 at Unknown time   Scheduled: . apixaban  5 mg Oral BID  . buPROPion  300 mg Oral Daily  . Chlorhexidine Gluconate Cloth  6 each Topical Daily  . diltiazem  120 mg Oral Daily  . escitalopram  10 mg Oral Daily  . ferrous sulfate  325 mg Oral BID WC  . furosemide  40 mg Intravenous Q12H  . glipiZIDE  10 mg Oral BID AC  . insulin aspart  0-20 Units Subcutaneous TID WC  . insulin detemir  10 Units Subcutaneous BID  . mouth rinse  15 mL Mouth Rinse BID  . metoprolol tartrate  50 mg Oral BID  . pantoprazole  40 mg Oral QAC breakfast   Continuous:  [id number] **OR** acetaminophen, levalbuterol, nystatin  Allergies  Allergen Reactions  . Propranolol Swelling    Pt states it may have been leg swelling  . Topamax [Topiramate] Other (See Comments)    hallucinations  . Latex Itching and Rash    ROS:  A comprehensive review of systems was negative except for: Respiratory: positive for shortness of breathing Cardiovascular: positive for chest pain and pain radiating to her back Genitourinary: positive for frequent UTI skin ulceration/ wounds that are painful   Blood pressure (!) 140/57, pulse (!) 57, temperature 98.1 F (36.7 C), temperature source Oral, resp. rate 19, height 5' 4"  (1.626 m), weight 102 kg, SpO2 100 %. Physical Exam Vitals signs reviewed.  Constitutional:      Appearance: She is obese.  HENT:     Head: Normocephalic.     Nose: Nose normal.     Mouth/Throat:     Mouth: Mucous membranes are moist.  Eyes:     Pupils: Pupils are equal, round, and reactive to light.  Neck:     Musculoskeletal: Normal range of motion.  Cardiovascular:     Rate and Rhythm: Normal rate.  Pulmonary:     Effort: Pulmonary effort is normal.  Abdominal:     General: There is no distension.     Palpations: Abdomen is soft.     Comments: Large pannus, wounds on the lower right side of pannus, ulcerative down to the subcutaneous tissue in multiple areas, some minor erythema in places  Musculoskeletal:     Right lower leg: Edema present.     Left lower leg: Edema present.  Skin:    General: Skin is warm.  Neurological:     General: No focal deficit present.     Mental Status: She is alert and oriented to person, place, and time.  Psychiatric:        Mood and Affect: Mood normal.        Behavior: Behavior normal.        Thought Content: Thought content normal.        Judgment: Judgment normal.       Results: Results for orders placed or performed during the hospital encounter of 02/17/19 (from the past 48 hour(s))  SARS Coronavirus 2 (CEPHEID- Performed in [facility] hospital lab),  Hosp Order     Status: None   Collection Time: 02/17/19  3:07 PM   Specimen: Nasopharyngeal Swab  Result Value Ref Range   SARS Coronavirus 2 NEGATIVE NEGATIVE    Comment: (NOTE) If result is NEGATIVE SARS-CoV-2 target nucleic acids are NOT DETECTED. The SARS-CoV-2 RNA is generally detectable in upper and lower  respiratory specimens during the acute phase of infection. The lowest  concentration of SARS-CoV-2 viral copies this assay can detect is 250   copies / mL. A negative result does not preclude SARS-CoV-2 infection  and should not be used as the sole basis for treatment or other  patient management decisions.  A negative result may occur with  improper specimen collection / handling, submission of specimen other  than nasopharyngeal swab, presence of viral mutation(s) within the  areas targeted by this assay, and inadequate number of viral copies  (<250 copies / mL). A negative result must be combined with clinical  observations, patient history, and epidemiological information. If result is POSITIVE SARS-CoV-2 target nucleic acids are DETECTED. The SARS-CoV-2 RNA is generally detectable in upper and lower  respiratory specimens dur ing the acute phase of infection.  Positive  results are indicative of active infection with SARS-CoV-2.  Clinical  correlation with patient history and other diagnostic information is  necessary to determine patient infection status.  Positive results do  not rule out bacterial infection or co-infection with other viruses. If result is PRESUMPTIVE POSTIVE SARS-CoV-2 nucleic acids MAY BE PRESENT.   A presumptive positive result was obtained on the submitted specimen  and confirmed on repeat testing.  While 2019 novel coronavirus  (SARS-CoV-2) nucleic acids may be present in the submitted sample  additional confirmatory testing may be necessary for epidemiological  and / or clinical management purposes  to differentiate between  SARS-CoV-2 and other Sarbecovirus currently known to infect humans.  If clinically indicated additional testing with an alternate test  methodology [phone]) is advised. The SARS-CoV-2 RNA is generally  detectable in upper and lower respiratory sp ecimens during the acute  phase of infection. The expected result is Negative. Fact Sheet for Patients:  StrictlyIdeas.no Fact Sheet for Healthcare Providers: BankingDealers.co.za  This test is not yet approved or cleared by the [place] FDA and has been authorized for detection and/or diagnosis of SARS-CoV-2 by FDA under an Emergency Use Authorization (EUA).  This EUA will remain in effect (meaning this test can be used) for the duration of the COVID-19 declaration under Section 564(b)(1) of the Act, 21 U.S.C. section 360bbb-3(b)(1), unless the authorization is terminated or revoked sooner. Performed at [facility], [address], [place], [place] [address]   Brain natriuretic peptide     Status: Abnormal   Collection Time: 02/17/19  3:22 PM  Result Value Ref Range   B Natriuretic Peptide 1,510.0 (H) 0.0 - 100.0 pg/mL    Comment: Performed at [facility], [address], [place], [place] [address]  CBC with Differential     Status: Abnormal   Collection Time: 02/17/19  3:22 PM  Result Value Ref Range   WBC 27.1 (H) 4.0 - 10.5 K/uL   RBC 4.01 3.87 - 5.11 MIL/uL   Hemoglobin 10.4 (L) 12.0 - 15.0 g/dL   HCT 36.7 36.0 - 46.0 %   MCV 91.5 80.0 - 100.0 fL   MCH 25.9 (L) 26.0 - 34.0 pg   MCHC 28.3 (L) 30.0 - 36.0 g/dL   RDW 19.5 (H) 11.5 - 15.5 %  Platelets 346 150 - 400 K/uL   nRBC 0.0 0.0 - 0.2 %   Neutrophils Relative % 94 %   Neutro Abs 25.2 (H) 1.7 - 7.7 K/uL   Lymphocytes Relative 2 %   Lymphs Abs 0.6 (L) 0.7 - 4.0 K/uL   Monocytes Relative 2 %   Monocytes Absolute 0.6 0.1 - 1.0 K/uL   Eosinophils Relative 0 %   Eosinophils Absolute 0.1 0.0 - 0.5 K/uL   Basophils Relative 0 %   Basophils Absolute 0.1 0.0 - 0.1 K/uL   WBC Morphology WHITE CELL COUNT CONFIRMED BY SMEAR FEW BANDS SEEN    Immature Granulocytes 2 %   Abs Immature Granulocytes 0.61 (H) 0.00 - 0.07 K/uL    Comment: Performed at [facility], [address], [place], [place] [address]  Magnesium     Status: Abnormal   Collection Time: 02/17/19  3:22 PM  Result Value Ref Range   Magnesium 1.5 (L) 1.7 - 2.4 mg/dL    Comment: Performed at [facility], [address], [place],  [place] [address]  Blood gas, venous     Status: Abnormal   Collection Time: 02/17/19  3:23 PM  Result Value Ref Range   FIO2 30.00    pH, Ven 7.444 (H) 7.250 - 7.430   pCO2, Ven 35.6 (L) 44.0 - 60.0 mmHg   pO2, Ven 37.8 32.0 - 45.0 mmHg   Bicarbonate 24.5 20.0 - 28.0 mmol/L   Acid-Base Excess 0.4 0.0 - 2.0 mmol/L   O2 Saturation 69.4 %   Patient temperature 36.6     Comment: Performed at [facility], [address] [place], [place] [address]  MRSA PCR Screening     Status: None   Collection Time: 02/17/19  6:47 PM   Specimen: Nasal Mucosa; Nasopharyngeal  Result Value Ref Range   MRSA by PCR NEGATIVE NEGATIVE    Comment:        The GeneXpert MRSA Assay (FDA approved for NASAL specimens only), is one component of a comprehensive MRSA colonization surveillance program. It is not intended to diagnose MRSA infection nor to guide or monitor treatment for MRSA infections. Performed at [facility], [address], [place], [place] [address]   CBC     Status: Abnormal   Collection Time: 02/17/19  9:43 PM  Result Value Ref Range   WBC 17.7 (H) 4.0 - 10.5 K/uL   RBC 3.26 (L) 3.87 - 5.11 MIL/uL   Hemoglobin 8.5 (L) 12.0 - 15.0 g/dL   HCT 29.0 (L) 36.0 - 46.0 %   MCV 89.0 80.0 - 100.0 fL   MCH 26.1 26.0 - 34.0 pg   MCHC 29.3 (L) 30.0 - 36.0 g/dL   RDW 19.1 (H) 11.5 - 15.5 %   Platelets 252 150 - 400 K/uL   nRBC 0.0 0.0 - 0.2 %    Comment: Performed at [facility], [address] [place], [place] [address]  Glucose, capillary     Status: Abnormal   Collection Time: 02/17/19 10:21 PM  Result Value Ref Range   Glucose-Capillary 217 (H) 70 - 99 mg/dL  Basic metabolic panel     Status: Abnormal   Collection Time: 02/18/19  5:31 AM  Result Value Ref Range   Sodium 137 135 - 145 mmol/L   Potassium 4.2 3.5 - 5.1 mmol/L   Chloride 103 98 - 111 mmol/L   CO2 26 22 - 32 mmol/L   Glucose, Bld 209 (H) 70 - 99 mg/dL   BUN  22 8 - 23 mg/dL   Creatinine, Ser 0.74 0.44 - 1.00 mg/dL   Calcium 8.6  (L) 8.9 - 10.3 mg/dL   GFR calc non Af Amer >60 >60 mL/min   GFR calc Af Amer >60 >60 mL/min   Anion gap 8 5 - 15    Comment: Performed at [facility], [address], [place], [place] [address]  Glucose, capillary     Status: Abnormal   Collection Time: 02/18/19  8:18 AM  Result Value Ref Range   Glucose-Capillary 223 (H) 70 - 99 mg/dL  Glucose, capillary     Status: Abnormal   Collection Time: 02/18/19 12:57 PM  Result Value Ref Range   Glucose-Capillary 147 (H) 70 - 99 mg/dL    Dg Chest Port 1 View  Result Date: 02/17/2019 CLINICAL DATA:  Shortness of breath. EXAM: PORTABLE CHEST 1 VIEW COMPARISON:  Radiograph Jan 10, 2019. FINDINGS: Stable cardiomediastinal silhouette. Increased central pulmonary vascular congestion is noted with possible bilateral pulmonary edema. No pneumothorax or pleural effusion is noted. Bony thorax is unremarkable. IMPRESSION: Increased central pulmonary vascular congestion is noted with possible bilateral pulmonary edema. Electronically Signed   By: Marijo Conception M.D.   On: 02/17/2019 15:49    Assessment & Plan:  CELESTER MORGAN is a 68 y.o. female with skin lesions on her abdomen that have been there for months. She was admitted for other issues around chest pain and pain into her back thought to be related to a iron transfusion reaction. She is having some similar symptoms now. Due to her risk factors, Dr. Manuella Ghazi is going to work her up.   I have called the lab at [facility] to speak with the supervisor who handles pathology, she is on vacation. I have called the pathology department and lab at [facility] and no one in pathology is available today due to the holiday. I would need to send an ellipse of tissue and I believe this would need to go fresh to pathology. I would also need to send some for culture of fungus, bacteria based on my reading. Given the lack of availability of pathology, I do not think today is be best day to do the biopsy as I am afraid the sample  will not be processed or handled appropriately and I cannot confirm the best option for processing the sample.  This is not an acute issue, and can be taken care of as outpatient if needed.   -If patient is her Monday, I can do a biopsy and call pathology.  Hold Eliquis on Sunday for the biopsy on Monday. If she is gone, then she can follow up with me in clinic to be set up for a biopsy at the hospital on a later date.  -Dr. Manuella Ghazi aware of chest pain and working her up at this time.   All questions were answered to the satisfaction of the patient.   Virl Cagey 02/18/2019, 2:27 PM

## 2019-02-18 NOTE — Consult Note (Signed)
[facility] Nurse wound consult note Patient receiving care in [facility] ICU 5.  Consult completed remotely via Telehealth, [place], primary care RN, Nira Conn, and history of the areas from the patient.  The areas have never been biopsed. Reason for Consult: Abdominal wounds Wound type: unclear etiology.  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 Eligah East along with a request for a photo to be placed in the EMR. Measurements: provided by the primary RN in the flowsheet Wound bed: There are 3 distinct areas.  The area most lateral and along the inferior abdominal fold, has a pink wound bed. The area most medial has a 100% yellow wound bed.  The area between these two seems the most shallow and has a pink wound bed.  ALL of these area surrounded by hard purple tinged tissue, and the patient states they are painful to touch. Drainage (amount, consistency, odor) The area in the middle bled when the dressing was removed, the other two had a little drainage.  The RN reports she does not detect an odor. The patient states these areas started in 2019 around Thanksgiving or Christmas. Dressing procedure/placement/frequency: Apply Aquacel Ag Kellie Simmering [phone]) to all areas, cover with ABD pad. Change daily. Monitor the wound area(s) for worsening of condition such as: Signs/symptoms of infection,  Increase in size,  Development of or worsening of odor, Development of pain, or increased pain at the affected locations.  Notify the medical team if any of these develop.  Thank you for the consult.  Discussed plan of care with the patient and bedside nurse.  [facility] nurse will not follow at this time.  Please re-consult the [facility] team if needed.  Val Riles, RN, MSN, CWOCN, CNS-BC, pager [phone]

## 2019-02-19 LAB — BASIC METABOLIC PANEL
Anion gap: 10 (ref 5–15)
BUN: 34 mg/dL — ABNORMAL HIGH (ref 8–23)
CO2: 26 mmol/L (ref 22–32)
Calcium: 8.6 mg/dL — ABNORMAL LOW (ref 8.9–10.3)
Chloride: 102 mmol/L (ref 98–111)
Creatinine, Ser: 0.99 mg/dL (ref 0.44–1.00)
GFR calc Af Amer: >60 mL/min (ref >60)
GFR calc non Af Amer: 59 mL/min — ABNORMAL LOW (ref >60)
Glucose, Bld: 126 mg/dL — ABNORMAL HIGH (ref 70–99)
Potassium: 4 mmol/L (ref 3.5–5.1)
Sodium: 138 mmol/L (ref 135–145)

## 2019-02-19 LAB — CBC
HCT: 29.3 % — ABNORMAL LOW (ref 36.0–46.0)
Hemoglobin: 8.6 g/dL — ABNORMAL LOW (ref 12.0–15.0)
MCH: 26.4 pg (ref 26.0–34.0)
MCHC: 29.4 g/dL — ABNORMAL LOW (ref 30.0–36.0)
MCV: 89.9 fL (ref 80.0–100.0)
Platelets: 299 10*3/uL (ref 150–400)
RBC: 3.26 MIL/uL — ABNORMAL LOW (ref 3.87–5.11)
RDW: 19 % — ABNORMAL HIGH (ref 11.5–15.5)
WBC: 12 10*3/uL — ABNORMAL HIGH (ref 4.0–10.5)
nRBC: 0 % (ref 0.0–0.2)

## 2019-02-19 LAB — GLUCOSE, CAPILLARY
Glucose-Capillary: 82 mg/dL (ref 70–99)
Glucose-Capillary: 98 mg/dL (ref 70–99)

## 2019-02-19 MED ORDER — FUROSEMIDE 20 MG PO TABS: 60.0000 mg | ORAL_TABLET | Freq: Two times a day (BID) | ORAL | 0 refills | Status: DC

## 2019-02-19 NOTE — TOC Transition Note (Signed)
Transition of Care [facility]) - CM/SW Discharge Note   Patient Details  Name: [name] MRN: [id number] Date of Birth: [birth year]  Transition of [facility]) CM/SW Contact:  Latanya Maudlin, RN Phone Number: 02/19/2019, 11:02 AM   Clinical Narrative:  Patient to be discharged per MD order. Orders in place for home health services. Patient active with [facility] and wishes to resume. Notified Melissa of resumption orders. No DME needs. Family to transport.      Final next level of care: Home w Home Health Services Barriers to Discharge: No Barriers Identified   Patient Goals and CMS Choice   CMS Medicare.gov Compare Post Acute Care list provided to:: Patient Choice offered to / list presented to : Patient  Discharge Placement                       Discharge Plan and Services     Post Acute Care Choice: Durable Medical Equipment                    HH Arranged: RN, PT, Nurse's Aide [facility] Agency: [place] ([facility]) Date [employer] Agency Contacted: 02/19/19 Time [place]: 2620 Representative spoke with at [place]: [place] ([place]) Interventions     Readmission Risk Interventions Readmission Risk Prevention Plan 02/19/2019 01/14/2019  Transportation Screening Complete Complete  Medication Review Press photographer) Complete Complete  PCP or Specialist appointment within 3-5 days of discharge Not Complete -  [place] or [place] Complete Complete  SW Recovery Care/Counseling Consult Patient refused Complete  Palliative Care Screening Not Applicable Not [place] Not Applicable Not Applicable  Some recent data might be hidden

## 2019-02-19 NOTE — Discharge Summary (Signed)
Physician Discharge Summary  [name] GQB:[id number] DOB: 03/03/1951 DOA: 02/17/2019  PCP: Katrina Cha, MD  Admit date: 02/17/2019  Discharge date: 02/19/2019  Admitted From:Home  Disposition:  Home  Recommendations for Outpatient Follow-up:  1. Follow up with PCP in 1-2 weeks with repeat BMP in 1 week and BP check 2. Please set up appointment with Dr. Constance Torres to follow-up in approximately 1 week for biopsy of abdominal wounds 3. Continue with home health nurses for wound care as prior 4. Lasix dose increased to 60 mg daily from 40 mg as it appears that she may be having some treatment failure from her current dose 5. Discontinue Actos at this point which can lead to volume overload  Home Health: Has home health with nursing and wound care  Equipment/Devices: None, patient is mostly wheelchair-bound and occasionally ambulates with walker  Discharge Condition: Stable  CODE STATUS: Full  Diet recommendation: Heart Healthy/carb modified with 2 L fluid restriction  Brief/Interim Summary: Per HPI: Katrina M Kingis a 68 y.o.femalewith medical history significant ofhis working atrial fibrillation, LV thrombus, diastolic CHF, hypertension, depression, type 2 diabetes, morbid obesity, osteoporosis, urinary incontinence, history of Klebsiella pneumonia UTI who was brought from the cancer center after developing an iron infusion reaction. She was given Solu-Medrol and Benadryl. She is states that shortly after the infusion was started she developed pain between her shoulder blades and mild chest pressure. This was associated with palpitations, mild diaphoresis and progressively worse dyspnea. She denies fever, chills, sore throat, but states she was coughing whitish sputum earlier. She also has mild lower extremity edema. She has occasional nausea, but denies abdominal pain, recent emesis, diarrhea, constipation, melena or hematochezia. No dysuria, frequency or hematuria.  Denies polyuria, polydipsia, polyphagia.  Patient was admitted with acute pulmonary edema secondary to acute on chronic diastolic CHF exacerbation.  She was noted to have atrial fibrillation with rapid ventricular rate on admission and this was suspected to be secondary to possible Feraheme transfusion.  She was started on nitroglycerin infusion and was given IV Lasix for diuresis.  She complained of some brief, intermittent episodes of chest and back pain which have now resolved and are felt to be related to her coughing and some musculoskeletal tenderness.  She has had EKG and troponin levels which have been unremarkable.  She is currently back to her baseline euvolemic state and I have discussed with her increasing her Lasix dose to 60 mg daily.  Additionally, will discontinue Actos which can contribute to volume overload in patients with CHF.  Continue other home medications as prescribed.  She will follow-up with Dr. Constance Torres of general surgery in the office setting for biopsy of her abdominal wounds.  Continue home wound care.  Follow-up with Dr. Delton Torres as previously scheduled for further iron infusions as needed.  Discharge Diagnoses:  Principal Problem:   Acute pulmonary edema (HCC) Active Problems:   Diabetes type 2, uncontrolled (HCC)   HTN (hypertension)   Depression   Atrial fibrillation (HCC)   Chronic diastolic CHF (congestive heart failure) (HCC)   CKD (chronic kidney disease), stage III (HCC)   COPD (chronic obstructive pulmonary disease) (HCC)   Skin ulcer of abdomen with fat layer exposed ([place])   Iron deficiency anemia  Principal discharge diagnosis: Acute on chronic diastolic CHF exacerbation.  Discharge Instructions  Discharge Instructions    Diet - low sodium heart healthy   Complete by: As directed    Increase activity slowly   Complete by: As directed  Allergies as of 02/19/2019      Reactions   Propranolol Swelling   Pt states it may have been leg  swelling   Topamax [topiramate] Other (See Comments)   hallucinations   Latex Itching, Rash      Medication List    STOP taking these medications   pioglitazone 45 MG tablet Commonly known as: ACTOS     TAKE these medications   acetaminophen 500 MG tablet Commonly known as: TYLENOL Take 500-1,000 mg by mouth every 8 (eight) hours as needed for mild pain or headache.   albuterol 108 (90 Base) MCG/ACT inhaler Commonly known as: VENTOLIN HFA Inhale 2 puffs into the lungs every 6 (six) hours as needed for wheezing or shortness of breath. What changed: reasons to take this   apixaban 5 MG Tabs tablet Commonly known as: ELIQUIS Take 1 tablet (5 mg total) by mouth 2 (two) times daily.   buPROPion 300 MG 24 hr tablet Commonly known as: WELLBUTRIN XL Take 300 mg by mouth daily.   diltiazem 120 MG 24 hr capsule Commonly known as: CARDIZEM CD Take 1 capsule (120 mg total) by mouth daily. What changed: additional instructions   escitalopram 10 MG tablet Commonly known as: LEXAPRO Take 10 mg by mouth daily.   ferrous sulfate 325 (65 FE) MG tablet Commonly known as: FerrouSul Take 1 tablet (325 mg total) by mouth 2 (two) times daily with a meal.   fluticasone 50 MCG/ACT nasal spray Commonly known as: FLONASE Place 2 sprays into both nostrils daily as needed for allergies or rhinitis (congestion).   furosemide 20 MG tablet Commonly known as: LASIX Take 3 tablets (60 mg total) by mouth 2 (two) times daily. What changed:   medication strength  how much to take  when to take this   glipiZIDE 10 MG tablet Commonly known as: GLUCOTROL Take 10 mg by mouth 2 (two) times daily.   levalbuterol 0.63 MG/3ML nebulizer solution Commonly known as: XOPENEX Take 0.63 mg by nebulization every 6 (six) hours as needed for wheezing or shortness of breath.   Levemir FlexTouch 100 UNIT/ML Pen Generic drug: Insulin Detemir Inject 10 Units into the skin 2 (two) times a day.    metoprolol tartrate 50 MG tablet Commonly known as: LOPRESSOR Take 50 mg by mouth 2 (two) times daily.   nystatin powder Commonly known as: MYCOSTATIN/NYSTOP Apply topically daily as needed (rash in skin folds).   OVER THE COUNTER MEDICATION Apply 1 application topically daily as needed (rash in skin folds). Baby butt paste   pantoprazole 40 MG tablet Commonly known as: PROTONIX Take 1 tablet (40 mg total) by mouth daily before breakfast. What changed:   when to take this  reasons to take this      Follow-up Information    Katrina Cha, MD Follow up in 1 week(s).   Specialty: Internal Medicine Contact information: [address] [place] [address] [phone]        Herminio Commons, MD .   Specialty: Cardiology Contact information: [place] [address] [phone]        Virl Cagey, MD. Schedule an appointment as soon as possible for a visit in 1 week(s).   Specialty: General Surgery Contact information: [address] [place] [facility] [address] [phone]          Allergies  Allergen Reactions  . Propranolol Swelling    Pt states it may have been leg swelling  . Topamax [  Topiramate] Other (See Comments)    hallucinations  . Latex Itching and Rash    Consultations:  General surgery   Procedures/Studies: Dg Chest Port 1 View  Result Date: 02/17/2019 CLINICAL DATA:  Shortness of breath. EXAM: PORTABLE CHEST 1 VIEW COMPARISON:  Radiograph Jan 10, 2019. FINDINGS: Stable cardiomediastinal silhouette. Increased central pulmonary vascular congestion is noted with possible bilateral pulmonary edema. No pneumothorax or pleural effusion is noted. Bony thorax is unremarkable. IMPRESSION: Increased central pulmonary vascular congestion is noted with possible bilateral pulmonary edema. Electronically Signed   By: Marijo Conception M.D.   On: 02/17/2019 15:49     Discharge Exam: Vitals:   02/19/19  0941 02/19/19 0943  BP: (!) 115/38 (!) 115/38  Pulse:  (!) 55  Resp:    Temp:    SpO2:     Vitals:   02/19/19 0500 02/19/19 0546 02/19/19 0941 02/19/19 0943  BP:  (!) 155/41 (!) 115/38 (!) 115/38  Pulse:  (!) 53  (!) 55  Resp:  17    Temp:  97.6 F (36.4 C)    TempSrc:  Oral    SpO2:  100%    Weight: 102.4 kg     Height:        General: Pt is alert, awake, not in acute distress Cardiovascular: RRR, S1/S2 +, no rubs, no gallops Respiratory: CTA bilaterally, no wheezing, no rhonchi Abdominal: Soft, NT, ND, bowel sounds + wounds are clean dry and intact.  No significant drainage noted. Extremities: no edema, no cyanosis    The results of significant diagnostics from this hospitalization (including imaging, microbiology, ancillary and laboratory) are listed below for reference.     Microbiology: Recent Results (from the past 240 hour(s))  SARS Coronavirus 2 (CEPHEID- Performed in [place] hospital lab), Hosp Order     Status: None   Collection Time: 02/17/19  3:07 PM   Specimen: Nasopharyngeal Swab  Result Value Ref Range Status   SARS Coronavirus 2 NEGATIVE NEGATIVE Final    Comment: (NOTE) If result is NEGATIVE SARS-CoV-2 target nucleic acids are NOT DETECTED. The SARS-CoV-2 RNA is generally detectable in upper and lower  respiratory specimens during the acute phase of infection. The lowest  concentration of SARS-CoV-2 viral copies this assay can detect is 250  copies / mL. A negative result does not preclude SARS-CoV-2 infection  and should not be used as the sole basis for treatment or other  patient management decisions.  A negative result may occur with  improper specimen collection / handling, submission of specimen other  than nasopharyngeal swab, presence of viral mutation(s) within the  areas targeted by this assay, and inadequate number of viral copies  (<250 copies / mL). A negative result must be combined with clinical  observations, patient history, and  epidemiological information. If result is POSITIVE SARS-CoV-2 target nucleic acids are DETECTED. The SARS-CoV-2 RNA is generally detectable in upper and lower  respiratory specimens dur ing the acute phase of infection.  Positive  results are indicative of active infection with SARS-CoV-2.  Clinical  correlation with patient history and other diagnostic information is  necessary to determine patient infection status.  Positive results do  not rule out bacterial infection or co-infection with other viruses. If result is PRESUMPTIVE POSTIVE SARS-CoV-2 nucleic acids MAY BE PRESENT.   A presumptive positive result was obtained on the submitted specimen  and confirmed on repeat testing.  While 2019 novel coronavirus  (SARS-CoV-2) nucleic acids may be present in the submitted  sample  additional confirmatory testing may be necessary for epidemiological  and / or clinical management purposes  to differentiate between  SARS-CoV-2 and other Sarbecovirus currently known to infect humans.  If clinically indicated additional testing with an alternate test  methodology [phone]) is advised. The SARS-CoV-2 RNA is generally  detectable in upper and lower respiratory sp ecimens during the acute  phase of infection. The expected result is Negative. Fact Sheet for Patients:  StrictlyIdeas.no Fact Sheet for Healthcare Providers: BankingDealers.co.za This test is not yet approved or cleared by the [place] FDA and has been authorized for detection and/or diagnosis of SARS-CoV-2 by FDA under an Emergency Use Authorization (EUA).  This EUA will remain in effect (meaning this test can be used) for the duration of the COVID-19 declaration under Section 564(b)(1) of the Act, 21 U.S.C. section 360bbb-3(b)(1), unless the authorization is terminated or revoked sooner. Performed at [facility], [address], [place], [place] [address]   MRSA PCR Screening      Status: None   Collection Time: 02/17/19  6:47 PM   Specimen: Nasal Mucosa; Nasopharyngeal  Result Value Ref Range Status   MRSA by PCR NEGATIVE NEGATIVE Final    Comment:        The GeneXpert MRSA Assay (FDA approved for NASAL specimens only), is one component of a comprehensive MRSA colonization surveillance program. It is not intended to diagnose MRSA infection nor to guide or monitor treatment for MRSA infections. Performed at [facility], [address], [place],  [address]      Labs: BNP (last 3 results) Recent Labs    08/16/18 1848 01/10/19 1646 02/17/19 1522  BNP 464.0* 2,107.0* 5,361.4*   Basic Metabolic Panel: Recent Labs  Lab 02/17/19 1522 02/18/19 0531 02/19/19 0637  NA  --  137 138  K  --  4.2 4.0  CL  --  103 102  CO2  --  26 26  GLUCOSE  --  209* 126*  BUN  --  22 34*  CREATININE  --  0.74 0.99  CALCIUM  --  8.6* 8.6*  MG 1.5*  --   --    Liver Function Tests: No results for input(s): AST, ALT, ALKPHOS, BILITOT, PROT, ALBUMIN in the last 168 hours. No results for input(s): LIPASE, AMYLASE in the last 168 hours. No results for input(s): AMMONIA in the last 168 hours. CBC: Recent Labs  Lab 02/17/19 1522 02/17/19 2143 02/19/19 0637  WBC 27.1* 17.7* 12.0*  NEUTROABS 25.2*  --   --   HGB 10.4* 8.5* 8.6*  HCT 36.7 29.0* 29.3*  MCV 91.5 89.0 89.9  PLT 346 252 299   Cardiac Enzymes: No results for input(s): CKTOTAL, CKMB, CKMBINDEX, TROPONINI in the last 168 hours. BNP: Invalid input(s): POCBNP CBG: Recent Labs  Lab 02/18/19 0818 02/18/19 1257 02/18/19 1616 02/18/19 2153 02/19/19 0747  GLUCAP 223* 147* 147* 145* 82   D-Dimer No results for input(s): DDIMER in the last 72 hours. Hgb A1c No results for input(s): HGBA1C in the last 72 hours. Lipid Profile No results for input(s): CHOL, HDL, LDLCALC, TRIG, CHOLHDL, LDLDIRECT in the last 72 hours. Thyroid function studies No results for input(s): TSH, T4TOTAL, T3FREE,  THYROIDAB in the last 72 hours.  Invalid input(s): FREET3 Anemia work up No results for input(s): VITAMINB12, FOLATE, FERRITIN, TIBC, IRON, RETICCTPCT in the last 72 hours. Urinalysis    Component Value Date/Time   COLORURINE AMBER (A) 01/10/2019 1546   APPEARANCEUR CLOUDY (A) 01/10/2019 1546  LABSPEC 1.016 01/10/2019 1546   PHURINE 5.0 01/10/2019 1546   GLUCOSEU NEGATIVE 01/10/2019 1546   HGBUR MODERATE (A) 01/10/2019 1546   BILIRUBINUR NEGATIVE 01/10/2019 1546   KETONESUR NEGATIVE 01/10/2019 1546   PROTEINUR 100 (A) 01/10/2019 1546   UROBILINOGEN 0.2 12/01/2007 1025   NITRITE NEGATIVE 01/10/2019 1546   LEUKOCYTESUR LARGE (A) 01/10/2019 1546   Sepsis Labs Invalid input(s): PROCALCITONIN,  WBC,  LACTICIDVEN Microbiology Recent Results (from the past 240 hour(s))  SARS Coronavirus 2 (CEPHEID- Performed in [place] hospital lab), Hosp Order     Status: None   Collection Time: 02/17/19  3:07 PM   Specimen: Nasopharyngeal Swab  Result Value Ref Range Status   SARS Coronavirus 2 NEGATIVE NEGATIVE Final    Comment: (NOTE) If result is NEGATIVE SARS-CoV-2 target nucleic acids are NOT DETECTED. The SARS-CoV-2 RNA is generally detectable in upper and lower  respiratory specimens during the acute phase of infection. The lowest  concentration of SARS-CoV-2 viral copies this assay can detect is 250  copies / mL. A negative result does not preclude SARS-CoV-2 infection  and should not be used as the sole basis for treatment or other  patient management decisions.  A negative result may occur with  improper specimen collection / handling, submission of specimen other  than nasopharyngeal swab, presence of viral mutation(s) within the  areas targeted by this assay, and inadequate number of viral copies  (<250 copies / mL). A negative result must be combined with clinical  observations, patient history, and epidemiological information. If result is POSITIVE SARS-CoV-2 target nucleic  acids are DETECTED. The SARS-CoV-2 RNA is generally detectable in upper and lower  respiratory specimens dur ing the acute phase of infection.  Positive  results are indicative of active infection with SARS-CoV-2.  Clinical  correlation with patient history and other diagnostic information is  necessary to determine patient infection status.  Positive results do  not rule out bacterial infection or co-infection with other viruses. If result is PRESUMPTIVE POSTIVE SARS-CoV-2 nucleic acids MAY BE PRESENT.   A presumptive positive result was obtained on the submitted specimen  and confirmed on repeat testing.  While 2019 novel coronavirus  (SARS-CoV-2) nucleic acids may be present in the submitted sample  additional confirmatory testing may be necessary for epidemiological  and / or clinical management purposes  to differentiate between  SARS-CoV-2 and other Sarbecovirus currently known to infect humans.  If clinically indicated additional testing with an alternate test  methodology [phone]) is advised. The SARS-CoV-2 RNA is generally  detectable in upper and lower respiratory sp ecimens during the acute  phase of infection. The expected result is Negative. Fact Sheet for Patients:  StrictlyIdeas.no Fact Sheet for Healthcare Providers: BankingDealers.co.za This test is not yet approved or cleared by the [place] FDA and has been authorized for detection and/or diagnosis of SARS-CoV-2 by FDA under an Emergency Use Authorization (EUA).  This EUA will remain in effect (meaning this test can be used) for the duration of the COVID-19 declaration under Section 564(b)(1) of the Act, 21 U.S.C. section 360bbb-3(b)(1), unless the authorization is terminated or revoked sooner. Performed at [facility], [address] [place], [place] [address]   MRSA PCR Screening     Status: None   Collection Time: 02/17/19  6:47 PM   Specimen: Nasal Mucosa;  Nasopharyngeal  Result Value Ref Range Status   MRSA by PCR NEGATIVE NEGATIVE Final    Comment:        The  GeneXpert MRSA Assay (FDA approved for NASAL specimens only), is one component of a comprehensive MRSA colonization surveillance program. It is not intended to diagnose MRSA infection nor to guide or monitor treatment for MRSA infections. Performed at [facility], [address], [place], [place] [address]      Time coordinating discharge: 35 minutes  SIGNED:   Rodena Goldmann, DO [facility] 02/19/2019, 9:57 AM  If 7PM-7AM, please contact night-coverage [email or web address] Password [email or web address]

## 2019-02-19 NOTE — Progress Notes (Signed)
Nsg Discharge Note  Admit Date:  02/17/2019 Discharge date: 02/19/2019   [name] to be D/C'd Home  per MD order.  AVS completed.  Copy for chart, and copy for patient signed, and dated. Patient/caregiver able to verbalize understanding.  Discharge Medication: Allergies as of 02/19/2019      Reactions   Propranolol Swelling   Pt states it may have been leg swelling   Topamax [topiramate] Other (See Comments)   hallucinations   Latex Itching, Rash      Medication List    STOP taking these medications   pioglitazone 45 MG tablet Commonly known as: ACTOS     TAKE these medications   acetaminophen 500 MG tablet Commonly known as: TYLENOL Take 500-1,000 mg by mouth every 8 (eight) hours as needed for mild pain or headache.   albuterol 108 (90 Base) MCG/ACT inhaler Commonly known as: VENTOLIN HFA Inhale 2 puffs into the lungs every 6 (six) hours as needed for wheezing or shortness of breath. What changed: reasons to take this   apixaban 5 MG Tabs tablet Commonly known as: ELIQUIS Take 1 tablet (5 mg total) by mouth 2 (two) times daily.   buPROPion 300 MG 24 hr tablet Commonly known as: WELLBUTRIN XL Take 300 mg by mouth daily.   diltiazem 120 MG 24 hr capsule Commonly known as: CARDIZEM CD Take 1 capsule (120 mg total) by mouth daily. What changed: additional instructions   escitalopram 10 MG tablet Commonly known as: LEXAPRO Take 10 mg by mouth daily.   ferrous sulfate 325 (65 FE) MG tablet Commonly known as: FerrouSul Take 1 tablet (325 mg total) by mouth 2 (two) times daily with a meal.   fluticasone 50 MCG/ACT nasal spray Commonly known as: FLONASE Place 2 sprays into both nostrils daily as needed for allergies or rhinitis (congestion).   furosemide 20 MG tablet Commonly known as: LASIX Take 3 tablets (60 mg total) by mouth 2 (two) times daily. What changed:   medication strength  how much to take  when to take this   glipiZIDE 10 MG  tablet Commonly known as: GLUCOTROL Take 10 mg by mouth 2 (two) times daily.   levalbuterol 0.63 MG/3ML nebulizer solution Commonly known as: XOPENEX Take 0.63 mg by nebulization every 6 (six) hours as needed for wheezing or shortness of breath.   Levemir FlexTouch 100 UNIT/ML Pen Generic drug: Insulin Detemir Inject 10 Units into the skin 2 (two) times a day.   metoprolol tartrate 50 MG tablet Commonly known as: LOPRESSOR Take 50 mg by mouth 2 (two) times daily.   nystatin powder Commonly known as: MYCOSTATIN/NYSTOP Apply topically daily as needed (rash in skin folds).   OVER THE COUNTER MEDICATION Apply 1 application topically daily as needed (rash in skin folds). Baby butt paste   pantoprazole 40 MG tablet Commonly known as: PROTONIX Take 1 tablet (40 mg total) by mouth daily before breakfast. What changed:   when to take this  reasons to take this       Discharge Assessment: Vitals:   02/19/19 0941 02/19/19 0943  BP: (!) 115/38 (!) 115/38  Pulse:  (!) 55  Resp:    Temp:    SpO2:     Skin clean, dry and intact without evidence of skin break down, no evidence of skin tears noted. IV catheter discontinued intact. Site without signs and symptoms of complications - no redness or edema noted at insertion site, patient denies c/o pain - only slight tenderness  at site.  Dressing with slight pressure applied.  D/c Instructions-Education: Discharge instructions given to patient with verbalized understanding. D/c education completed with patient including follow up instructions, medication list, d/c activities limitations if indicated, with other d/c instructions as indicated by MD - patient able to verbalize understanding, all questions fully answered. Patient instructed to return to ED, call 911, or call MD for any changes in condition.  Patient escorted via [place], and D/C home via private auto.  Mckayla Mulcahey Loletha Grayer, RN 02/19/2019 12:13 PM

## 2019-02-22 DIAGNOSIS — F329 Major depressive disorder, single episode, unspecified: Secondary | ICD-10-CM | POA: Diagnosis not present

## 2019-02-22 DIAGNOSIS — Z6841 Body Mass Index (BMI) 40.0 and over, adult: Secondary | ICD-10-CM | POA: Diagnosis not present

## 2019-02-22 DIAGNOSIS — E1165 Type 2 diabetes mellitus with hyperglycemia: Secondary | ICD-10-CM | POA: Diagnosis not present

## 2019-02-22 DIAGNOSIS — E1122 Type 2 diabetes mellitus with diabetic chronic kidney disease: Secondary | ICD-10-CM | POA: Diagnosis not present

## 2019-02-22 DIAGNOSIS — J449 Chronic obstructive pulmonary disease, unspecified: Secondary | ICD-10-CM | POA: Diagnosis not present

## 2019-02-22 DIAGNOSIS — I5033 Acute on chronic diastolic (congestive) heart failure: Secondary | ICD-10-CM | POA: Diagnosis not present

## 2019-02-22 DIAGNOSIS — I130 Hypertensive heart and chronic kidney disease with heart failure and stage 1 through stage 4 chronic kidney disease, or unspecified chronic kidney disease: Secondary | ICD-10-CM | POA: Diagnosis not present

## 2019-02-22 DIAGNOSIS — S31109D Unspecified open wound of abdominal wall, unspecified quadrant without penetration into peritoneal cavity, subsequent encounter: Secondary | ICD-10-CM | POA: Diagnosis not present

## 2019-02-22 DIAGNOSIS — L03311 Cellulitis of abdominal wall: Secondary | ICD-10-CM | POA: Diagnosis not present

## 2019-02-22 DIAGNOSIS — N183 Chronic kidney disease, stage 3 (moderate): Secondary | ICD-10-CM | POA: Diagnosis not present

## 2019-02-22 DIAGNOSIS — Z87440 Personal history of urinary (tract) infections: Secondary | ICD-10-CM | POA: Diagnosis not present

## 2019-02-22 DIAGNOSIS — I482 Chronic atrial fibrillation, unspecified: Secondary | ICD-10-CM | POA: Diagnosis not present

## 2019-02-24 DIAGNOSIS — R030 Elevated blood-pressure reading, without diagnosis of hypertension: Secondary | ICD-10-CM | POA: Diagnosis not present

## 2019-02-24 DIAGNOSIS — S31109D Unspecified open wound of abdominal wall, unspecified quadrant without penetration into peritoneal cavity, subsequent encounter: Secondary | ICD-10-CM | POA: Diagnosis not present

## 2019-02-24 DIAGNOSIS — E11649 Type 2 diabetes mellitus with hypoglycemia without coma: Secondary | ICD-10-CM | POA: Diagnosis not present

## 2019-02-25 ENCOUNTER — Other Ambulatory Visit: Payer: Self-pay

## 2019-02-25 NOTE — Patient Outreach (Signed)
[facility]) Care Management  02/25/2019  [name] Dec 01, 1950 [id number]    Brief outreach with [name]. She was busy at the time of the call. She will attempt to follow-up later today if available. Otherwise, will follow-up within the next week.  [place] [phone]

## 2019-02-26 DIAGNOSIS — I130 Hypertensive heart and chronic kidney disease with heart failure and stage 1 through stage 4 chronic kidney disease, or unspecified chronic kidney disease: Secondary | ICD-10-CM | POA: Diagnosis not present

## 2019-02-26 DIAGNOSIS — Z87440 Personal history of urinary (tract) infections: Secondary | ICD-10-CM | POA: Diagnosis not present

## 2019-02-26 DIAGNOSIS — E1122 Type 2 diabetes mellitus with diabetic chronic kidney disease: Secondary | ICD-10-CM | POA: Diagnosis not present

## 2019-02-26 DIAGNOSIS — Z6841 Body Mass Index (BMI) 40.0 and over, adult: Secondary | ICD-10-CM | POA: Diagnosis not present

## 2019-02-26 DIAGNOSIS — N183 Chronic kidney disease, stage 3 (moderate): Secondary | ICD-10-CM | POA: Diagnosis not present

## 2019-02-26 DIAGNOSIS — E1165 Type 2 diabetes mellitus with hyperglycemia: Secondary | ICD-10-CM | POA: Diagnosis not present

## 2019-02-26 DIAGNOSIS — F329 Major depressive disorder, single episode, unspecified: Secondary | ICD-10-CM | POA: Diagnosis not present

## 2019-02-26 DIAGNOSIS — I482 Chronic atrial fibrillation, unspecified: Secondary | ICD-10-CM | POA: Diagnosis not present

## 2019-02-26 DIAGNOSIS — I5033 Acute on chronic diastolic (congestive) heart failure: Secondary | ICD-10-CM | POA: Diagnosis not present

## 2019-02-26 DIAGNOSIS — J449 Chronic obstructive pulmonary disease, unspecified: Secondary | ICD-10-CM | POA: Diagnosis not present

## 2019-02-26 DIAGNOSIS — L03311 Cellulitis of abdominal wall: Secondary | ICD-10-CM | POA: Diagnosis not present

## 2019-02-26 DIAGNOSIS — S31109D Unspecified open wound of abdominal wall, unspecified quadrant without penetration into peritoneal cavity, subsequent encounter: Secondary | ICD-10-CM | POA: Diagnosis not present

## 2019-03-01 ENCOUNTER — Ambulatory Visit: Payer: BC Managed Care – PPO | Admitting: General Surgery

## 2019-03-01 ENCOUNTER — Encounter: Payer: Self-pay | Admitting: General Surgery

## 2019-03-01 ENCOUNTER — Other Ambulatory Visit: Payer: Self-pay

## 2019-03-01 VITALS — BP 178/80 | HR 56 | Temp 96.9°F | Resp 18 | Ht 64.0 in | Wt 215.0 lb

## 2019-03-01 DIAGNOSIS — L98492 Non-pressure chronic ulcer of skin of other sites with fat layer exposed: Secondary | ICD-10-CM | POA: Diagnosis not present

## 2019-03-01 NOTE — Progress Notes (Signed)
[facility] History and Physical  Reason for Referral: Ulcerated lesions on abdomen r/o pyoderma gangrenosum  Referring Physician:  Dr. Manuella Ghazi Hospitalist   Chief Complaint    Pre-op Exam      [name] is a 68 y.o. female.  HPI: [name] is a patient with multiple medical issues who I met in the hospital after a consult for excisional biospy of ulcerative skin lesions on her abdomen. She has a history of A fib, CHF, Dm, UTI chronically and notes these wounds on her pannus for several months starting in November. She has seen her PCP and has a home health RN that has been trying to manage the wounds. She reports doing dressing changes to the wounds and also reports doing some nystatin and creams to the area when they first started. The wounds at this time are ulcerative and have a layer of fat exposed in places.  The patient has no other lesions of this nature.  These have been getting worse over time and now have become chronic in nature.   I met the patient when she was in the hospital a few weeks ago with a transfusion reaction to iron and Dr. Manuella Ghazi the hospitalist taking care of her noted the wounds, and asked me to do a biopsy. This was over the July 4th holiday and I was unable to verify that pathology wound be available for the specimen if it needed to be sent fresh.    Past Medical History:  Diagnosis Date  . Atrial fibrillation ([name])   . CHF (congestive heart failure) ([place])   . Depression   . Diabetes mellitus without complication ([place])   . 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 Hypertension   . Morbid obesity ([place])   . Osteoporosis   . Urinary incontinence   . UTI (lower urinary tract infection) 01/2016   Cipro for Klebsiella pneumoniae isolate  . Wheelchair bound    bedbound    Past Surgical History:  Procedure Laterality Date  . ABDOMINAL HYSTERECTOMY    . BIOPSY  09/06/2018   Procedure: BIOPSY;  Surgeon: Danie Binder, MD;  [facility];  Service: Endoscopy;;  gastric bx's  . CESAREAN SECTION    . CHOLECYSTECTOMY    . ESOPHAGEAL DILATION  09/06/2018   Procedure: ESOPHAGEAL DILATION;  Surgeon: Danie Binder, MD;  [facility];  Service: Endoscopy;;  . ESOPHAGOGASTRODUODENOSCOPY (EGD) WITH PROPOFOL N/A 09/06/2018   Procedure: ESOPHAGOGASTRODUODENOSCOPY (EGD) WITH PROPOFOL;  Surgeon: Danie Binder, MD;  [facility];  Service: Endoscopy;  Laterality: N/A;  dilatation  . FEMUR IM NAIL Left 02/20/2016  . FEMUR IM NAIL Left 02/20/2016   Procedure: INTRAMEDULLARY (IM) RETROGRADE FEMORAL NAILING;  Surgeon: Leandrew Koyanagi, MD;  Location: [place];  Service: Orthopedics;  Laterality: Left;  . PANCREAS SURGERY  1967   1 cyst excised and one cyst drained  . TEE WITHOUT CARDIOVERSION N/A 04/05/2018   Procedure: TRANSESOPHAGEAL ECHOCARDIOGRAM (TEE);  Surgeon: Dorothy Spark, MD;  Location: [place];  Service: Cardiovascular;  Laterality: N/A;  . [place]    . WRIST FRACTURE SURGERY      Family History  Problem Relation Age of Onset  . Diabetes Mother        died in her 43's of a stroke  . Cancer Mother   . Stroke Mother   . Breast cancer Mother   . Diabetes Father  died in his 28's of a stroke.  . Stroke Father   . Diabetes Brother        died @ 33 of a stroke.  . Stroke Brother   . Diabetes Maternal Grandmother   . Diabetes Maternal Grandfather   . Diabetes Paternal Grandmother   . Diabetes Paternal Grandfather   . Breast cancer Maternal Aunt     Social History   Tobacco Use  . Smoking status: Never Smoker  . Smokeless tobacco: Never Used  Substance Use Topics  . Alcohol use: No  . Drug use: No    Medications: I have reviewed the patient's current medications. Allergies as of 03/01/2019      Reactions   Feraheme [ferumoxytol] Other (See Comments)   Back pain (yelling out with back pain)   Propranolol Swelling   Pt states it  may have been leg swelling   Topamax [topiramate] Other (See Comments)   hallucinations   Latex Itching, Rash      Medication List       Accurate as of March 01, 2019  9:14 AM. If you have any questions, ask your nurse or doctor.        acetaminophen 500 MG tablet Commonly known as: TYLENOL Take 500-1,000 mg by mouth every 8 (eight) hours as needed for mild pain or headache.   albuterol 108 (90 Base) MCG/ACT inhaler Commonly known as: VENTOLIN HFA Inhale 2 puffs into the lungs every 6 (six) hours as needed for wheezing or shortness of breath. What changed: reasons to take this   apixaban 5 MG Tabs tablet Commonly known as: ELIQUIS Take 1 tablet (5 mg total) by mouth 2 (two) times daily.   buPROPion 300 MG 24 hr tablet Commonly known as: WELLBUTRIN XL Take 300 mg by mouth daily.   diltiazem 120 MG 24 hr capsule Commonly known as: CARDIZEM CD Take 1 capsule (120 mg total) by mouth daily. What changed: additional instructions   escitalopram 10 MG tablet Commonly known as: LEXAPRO Take 10 mg by mouth daily.   ferrous sulfate 325 (65 FE) MG tablet Commonly known as: FerrouSul Take 1 tablet (325 mg total) by mouth 2 (two) times daily with a meal.   fluticasone 50 MCG/ACT nasal spray Commonly known as: FLONASE Place 2 sprays into both nostrils daily as needed for allergies or rhinitis (congestion).   furosemide 20 MG tablet Commonly known as: LASIX Take 3 tablets (60 mg total) by mouth 2 (two) times daily.   glipiZIDE 10 MG tablet Commonly known as: GLUCOTROL Take 10 mg by mouth 2 (two) times daily.   levalbuterol 0.63 MG/3ML nebulizer solution Commonly known as: XOPENEX Take 0.63 mg by nebulization every 6 (six) hours as needed for wheezing or shortness of breath.   Levemir FlexTouch 100 UNIT/ML Pen Generic drug: Insulin Detemir Inject 10 Units into the skin 2 (two) times a day.   metoprolol tartrate 50 MG tablet Commonly known as: LOPRESSOR Take 50 mg by  mouth 2 (two) times daily.   nystatin powder Commonly known as: MYCOSTATIN/NYSTOP Apply topically daily as needed (rash in skin folds).   OVER THE COUNTER MEDICATION Apply 1 application topically daily as needed (rash in skin folds). Baby butt paste   pantoprazole 40 MG tablet Commonly known as: PROTONIX Take 1 tablet (40 mg total) by mouth daily before breakfast. What changed:   when to take this  reasons to take this        ROS:  A comprehensive review of  systems was negative except for: chronic draining wounds on the abdomen, induration inferior to wounds  Blood pressure (!) 178/80, pulse (!) 56, temperature (!) 96.9 F (36.1 C), temperature source Tympanic, resp. rate 18, height 5' 4"  (1.626 m), weight 215 lb (97.5 kg), SpO2 94 %. Physical Exam Vitals signs reviewed.  HENT:     Head: Normocephalic.     Nose: Nose normal.     Mouth/Throat:     Mouth: Mucous membranes are moist.  Eyes:     Pupils: Pupils are equal, round, and reactive to light.  Cardiovascular:     Rate and Rhythm: Normal rate.  Pulmonary:     Effort: Pulmonary effort is normal.     Breath sounds: Normal breath sounds.  Abdominal:     General: There is no distension.     Palpations: Abdomen is soft.     Tenderness: There is abdominal tenderness.     Comments: Open draining wounds on the right inferior pannus, induration inferior, 3 separate areas, most medial 1.5cm ulcerated with fat exposed, curled edges of skin, middle area with red friable skin, lateral ulcer 1cm with curled skin edges, small amount of fat exposed, generalized erythema and some minor serous drainage   Neurological:     Mental Status: She is alert.     Results: CT 12/2018- thickened skin and inflammation in the right lower quadrant pannus, some gas from open wounds, fat without significant inflammation deep    Assessment & Plan:  CHINENYE KATZENBERGER is a 68 y.o. 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 I have been asked to perform an excisional biopsy to help diagnosis the possibility of pyoderma gangrenosum. I have discussed with the patient that we will do what we need to in order to see an adequate sample but I do worry about healing in this area and causing worsening problems. We discussed the risk of bleeding, infection, non healing wounds, and development of more ulcers. We discussed that this is to diagnose and not to treat, and that we still may not have a definitive diagnosis after this is performed.   - Procedure for excisional biopsy. I will need to verify with pathology how to send the sample to rule out pyoderma gangrenosum, will need to send some specimen for culture of bacteria and fungus as well  - Hold Eliquis for 2 days prior - Patient wants this under local and no sedation   All questions were answered to the satisfaction of the patient.  Greater than 50% of the 25 minute visit was spent in counseling/ coordination of care regarding the skin ulcers and risk of biopsy of these wounds.   Virl Cagey 03/01/2019, 9:14 AM

## 2019-03-01 NOTE — Patient Instructions (Addendum)
Hold Eliquis for 2 days prior.  Covid testing will be done prior to surgery.   Skin Biopsy A skin biopsy is a procedure to remove a sample of your skin (lesion) so that it can be checked under a microscope. You may need a skin biopsy if you have a skin disease or abnormal changes in your skin. Tell a health care provider about:  Any allergies you have.  All medicines you are taking, including vitamins, herbs, eye drops, creams, and over-the-counter medicines.  Any problems you or family members have had with anesthetic medicines.  Any blood disorders you have.  Any surgeries you have had.  Any medical conditions you have or have had.  Whether you are pregnant or may be pregnant. What are the risks? Generally, this is a safe procedure. However, problems may occur, including:  Infection.  Bleeding.  Allergic reaction to medicines.  Scarring. What happens before the procedure?  Ask your health care provider about: ? Changing or stopping your regular medicines. This is especially important if you are taking blood thinners. ? Taking medicines such as aspirin and ibuprofen. These medicines can thin your blood. Do not take these medicines unless your health care provider tells you to take them. ? Taking over-the-counter medicines, vitamins, herbs, and supplements.  Ask your health care provider if you will need someone to take you home from the hospital or clinic after the procedure.  Ask your health care provider how your biopsy site will be marked or identified.  Ask your health care provider what steps will be taken to help prevent infection. These may include: ? Removing hair at the surgery site. ? Washing skin with a germ-killing soap. ? Taking antibiotic medicine. What happens during the procedure?   You may be given medicine to numb the area (local anesthetic).  Your health care provider will take a sample using one of these steps, depending on the type of skin  problem that you have: ? Shave biopsy. Your health care provider will shave away layers of your skin lesion with a sharp blade. After shaving, a gel or ointment may be used to control bleeding. ? Punch biopsy. Your health care provider will use a tool to remove all or part of the lesion. This leaves a small hole about the width of a pencil eraser. The area may be covered with a gel or ointment. ? Excisional or incisional biopsy. Your health care provider will use a surgical blade to remove all or part of your lesion.  Your skin biopsy site may be closed with stitches (sutures).  A bandage (dressing) will be applied. The procedure may vary among health care providers and hospitals. What happens after the procedure?  Your skin sample will be sent to a lab for tests.  Your skin biopsy site will be watched to make sure that it stops bleeding.  You will be given instructions on how to care for your biopsy site.  It is up to you to get the results of your procedure. Ask your health care provider, or the department that is doing the procedure, when your results will be ready. Summary  A skin biopsy is a procedure to remove a sample of your skin (lesion) so that it can be checked under a microscope.  Tell a health care provider about your medical history and all medicines you are taking, including vitamins, herbs, eye drops, creams, and over-the-counter medicines.  Before the procedure, ask your health care provider about changing or stopping  your regular medicines.  During the procedure, your health care provider will take a skin sample from the area where you have the skin problem.  After the procedure, your skin sample will be sent to a laboratory for testing. This information is not intended to replace advice given to you by your health care provider. Make sure you discuss any questions you have with your health care provider. Document Released: 09/05/2004 Document Revised: 02/01/2018  Document Reviewed: 02/01/2018 Elsevier Patient Education  2020 [employer].

## 2019-03-02 ENCOUNTER — Other Ambulatory Visit: Payer: Self-pay

## 2019-03-02 NOTE — Patient Outreach (Signed)
[facility]) Care Management  03/02/2019  [name] [birth year] [id number]    Incoming call from [name]. She reports receiving multiple documents in the mail regarding prescription and pharmacy changes. She is requesting assistance.  Discussed case with [facility] Pharmacist. [name] is currently enrolled with [facility]. Will attempt outreach with the assigned pharmacist.  PLAN -Will follow-up after discussion with pharmacist.  [place] Manager [phone]

## 2019-03-02 NOTE — H&P (Addendum)
[facility] History and Physical  Reason for Referral: Ulcerated lesions on abdomen r/o pyoderma gangrenosum  Referring Physician:  Dr. Manuella Ghazi Hospitalist      Chief Complaint    Pre-op Exam      [name] is a 68 y.o. female.  HPI: Ms. Bosler is a patient with multiple medical issues who I met in the hospital after a consult for excisional biospy of ulcerative skin lesions on her abdomen. She has a history of A fib, CHF, Dm, UTI chronically and notes these wounds on her pannus for several months starting in November. She has seen her PCP and has a home health RN that has been trying to manage the wounds. She reports doing dressing changes to the wounds and also reports doing some nystatin and creams to the area when they first started. The wounds at this time are ulcerative and have a layer of fat exposed in places.  The patient has no other lesions of this nature.  These have been getting worse over time and now have become chronic in nature.   I met the patient when she was in the hospital a few weeks ago with a transfusion reaction to iron and Dr. Manuella Ghazi the hospitalist taking care of her noted the wounds, and asked me to do a biopsy. This was over the July 4th holiday and I was unable to verify that pathology wound be available for the specimen if it needed to be sent fresh.        Past Medical History:  Diagnosis Date  . Atrial fibrillation ([place])   . CHF (congestive heart failure) ([place])   . Depression   . Diabetes mellitus without complication ([place])   . 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 Hypertension   . Morbid obesity ([employer])   . Osteoporosis   . Urinary incontinence   . UTI (lower urinary tract infection) 01/2016   Cipro for Klebsiella pneumoniae isolate  . Wheelchair bound    bedbound         Past Surgical History:  Procedure Laterality Date  . ABDOMINAL HYSTERECTOMY    . BIOPSY  09/06/2018    Procedure: BIOPSY;  Surgeon: Danie Binder, MD;  [facility];  Service: Endoscopy;;  gastric bx's  . CESAREAN SECTION    . CHOLECYSTECTOMY    . ESOPHAGEAL DILATION  09/06/2018   Procedure: ESOPHAGEAL DILATION;  Surgeon: Danie Binder, MD;  [facility];  Service: Endoscopy;;  . ESOPHAGOGASTRODUODENOSCOPY (EGD) WITH PROPOFOL N/A 09/06/2018   Procedure: ESOPHAGOGASTRODUODENOSCOPY (EGD) WITH PROPOFOL;  Surgeon: Danie Binder, MD;  [facility];  Service: Endoscopy;  Laterality: N/A;  dilatation  . FEMUR IM NAIL Left 02/20/2016  . FEMUR IM NAIL Left 02/20/2016   Procedure: INTRAMEDULLARY (IM) RETROGRADE FEMORAL NAILING;  Surgeon: Leandrew Koyanagi, MD;  Location: [facility];  Service: Orthopedics;  Laterality: Left;  . PANCREAS SURGERY  1967   1 cyst excised and one cyst drained  . TEE WITHOUT CARDIOVERSION N/A 04/05/2018   Procedure: TRANSESOPHAGEAL ECHOCARDIOGRAM (TEE);  Surgeon: Dorothy Spark, MD;  Location: [place];  Service: Cardiovascular;  Laterality: N/A;  . [place]    . WRIST FRACTURE SURGERY           Family History  Problem Relation Age of Onset  . Diabetes Mother        died in her 27's of a stroke  . Cancer  Mother   . Stroke Mother   . Breast cancer Mother   . Diabetes Father        died in his 64's of a stroke.  . Stroke Father   . Diabetes Brother        died @ 18 of a stroke.  . Stroke Brother   . Diabetes Maternal Grandmother   . Diabetes Maternal Grandfather   . Diabetes Paternal Grandmother   . Diabetes Paternal Grandfather   . Breast cancer Maternal Aunt     Social History       Tobacco Use  . Smoking status: Never Smoker  . Smokeless tobacco: Never Used  Substance Use Topics  . Alcohol use: No  . Drug use: No    Medications: I have reviewed the patient's current medications.      Allergies as of 03/01/2019      Reactions   Feraheme [ferumoxytol] Other  (See Comments)   Back pain (yelling out with back pain)   Propranolol Swelling   Pt states it may have been leg swelling   Topamax [topiramate] Other (See Comments)   hallucinations   Latex Itching, Rash         Medication List       Accurate as of March 01, 2019  9:14 AM. If you have any questions, ask your nurse or doctor.        acetaminophen 500 MG tablet Commonly known as: TYLENOL Take 500-1,000 mg by mouth every 8 (eight) hours as needed for mild pain or headache.   albuterol 108 (90 Base) MCG/ACT inhaler Commonly known as: VENTOLIN HFA Inhale 2 puffs into the lungs every 6 (six) hours as needed for wheezing or shortness of breath. What changed: reasons to take this   apixaban 5 MG Tabs tablet Commonly known as: ELIQUIS Take 1 tablet (5 mg total) by mouth 2 (two) times daily.   buPROPion 300 MG 24 hr tablet Commonly known as: WELLBUTRIN XL Take 300 mg by mouth daily.   diltiazem 120 MG 24 hr capsule Commonly known as: CARDIZEM CD Take 1 capsule (120 mg total) by mouth daily. What changed: additional instructions   escitalopram 10 MG tablet Commonly known as: LEXAPRO Take 10 mg by mouth daily.   ferrous sulfate 325 (65 FE) MG tablet Commonly known as: FerrouSul Take 1 tablet (325 mg total) by mouth 2 (two) times daily with a meal.   fluticasone 50 MCG/ACT nasal spray Commonly known as: FLONASE Place 2 sprays into both nostrils daily as needed for allergies or rhinitis (congestion).   furosemide 20 MG tablet Commonly known as: LASIX Take 3 tablets (60 mg total) by mouth 2 (two) times daily.   glipiZIDE 10 MG tablet Commonly known as: GLUCOTROL Take 10 mg by mouth 2 (two) times daily.   levalbuterol 0.63 MG/3ML nebulizer solution Commonly known as: XOPENEX Take 0.63 mg by nebulization every 6 (six) hours as needed for wheezing or shortness of breath.   Levemir FlexTouch 100 UNIT/ML Pen Generic drug: Insulin Detemir Inject 10  Units into the skin 2 (two) times a day.   metoprolol tartrate 50 MG tablet Commonly known as: LOPRESSOR Take 50 mg by mouth 2 (two) times daily.   nystatin powder Commonly known as: MYCOSTATIN/NYSTOP Apply topically daily as needed (rash in skin folds).   OVER THE COUNTER MEDICATION Apply 1 application topically daily as needed (rash in skin folds). Baby butt paste   pantoprazole 40 MG tablet Commonly known as: PROTONIX Take  1 tablet (40 mg total) by mouth daily before breakfast. What changed:   when to take this  reasons to take this        ROS:  A comprehensive review of systems was negative except for: chronic draining wounds on the abdomen, induration inferior to wounds  Blood pressure (!) 178/80, pulse (!) 56, temperature (!) 96.9 F (36.1 C), temperature source Tympanic, resp. rate 18, height 5' 4"  (1.626 m), weight 215 lb (97.5 kg), SpO2 94 %. Physical Exam Vitals signs reviewed.  HENT:     Head: Normocephalic.     Nose: Nose normal.     Mouth/Throat:     Mouth: Mucous membranes are moist.  Eyes:     Pupils: Pupils are equal, round, and reactive to light.  Cardiovascular:     Rate and Rhythm: Normal rate.  Pulmonary:     Effort: Pulmonary effort is normal.     Breath sounds: Normal breath sounds.  Abdominal:     General: There is no distension.     Palpations: Abdomen is soft.     Tenderness: There is abdominal tenderness.     Comments: Open draining wounds on the right inferior pannus, induration inferior, 3 separate areas, most medial 1.5cm ulcerated with fat exposed, curled edges of skin, middle area with red friable skin, lateral ulcer 1cm with curled skin edges, small amount of fat exposed, generalized erythema and some minor serous drainage   Neurological:     Mental Status: She is alert.     Results: CT 12/2018- thickened skin and inflammation in the right lower quadrant pannus, some gas from open wounds, fat without significant  inflammation deep    Assessment & Plan:  TIKITA MABEE is a 68 y.o. 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 I have been asked to perform an excisional biopsy to help diagnosis the possibility of pyoderma gangrenosum. I have discussed with the patient that we will do what we need to in order to see an adequate sample but I do worry about healing in this area and causing worsening problems. We discussed the risk of bleeding, infection, non healing wounds, and development of more ulcers. We discussed that this is to diagnose and not to treat, and that we still may not have a definitive diagnosis after this is performed.   - Procedure for excisional biopsy. I will need to verify with pathology how to send the sample to rule out pyoderma gangrenosum, will need to send some specimen for culture of bacteria and fungus as well  - Hold Eliquis for 2 days prior - Patient wants this under local and no sedation   All questions were answered to the satisfaction of the patient.  Greater than 50% of the 25 minute visit was spent in counseling/ coordination of care regarding the skin ulcers and risk of biopsy of these wounds.   We were told that for the minor procedure the patient will not need preoperative covid testing.   Virl Cagey 03/01/2019, 9:14 AM

## 2019-03-03 ENCOUNTER — Encounter (HOSPITAL_COMMUNITY): Payer: Self-pay

## 2019-03-03 ENCOUNTER — Emergency Department (HOSPITAL_COMMUNITY)
Admission: EM | Admit: 2019-03-03 | Discharge: 2019-03-03 | Disposition: A | Discharge status: Home / Self Care | Payer: BC Managed Care – PPO | Attending: Emergency Medicine | Admitting: Emergency Medicine

## 2019-03-03 ENCOUNTER — Other Ambulatory Visit: Payer: Self-pay

## 2019-03-03 DIAGNOSIS — I509 Heart failure, unspecified: Secondary | ICD-10-CM | POA: Insufficient documentation

## 2019-03-03 DIAGNOSIS — Z6841 Body Mass Index (BMI) 40.0 and over, adult: Secondary | ICD-10-CM | POA: Diagnosis not present

## 2019-03-03 DIAGNOSIS — I482 Chronic atrial fibrillation, unspecified: Secondary | ICD-10-CM | POA: Diagnosis not present

## 2019-03-03 DIAGNOSIS — E119 Type 2 diabetes mellitus without complications: Secondary | ICD-10-CM | POA: Diagnosis not present

## 2019-03-03 DIAGNOSIS — E1122 Type 2 diabetes mellitus with diabetic chronic kidney disease: Secondary | ICD-10-CM | POA: Diagnosis not present

## 2019-03-03 DIAGNOSIS — Z87440 Personal history of urinary (tract) infections: Secondary | ICD-10-CM | POA: Diagnosis not present

## 2019-03-03 DIAGNOSIS — R531 Weakness: Secondary | ICD-10-CM | POA: Diagnosis not present

## 2019-03-03 DIAGNOSIS — J449 Chronic obstructive pulmonary disease, unspecified: Secondary | ICD-10-CM | POA: Diagnosis not present

## 2019-03-03 DIAGNOSIS — K274 Chronic or unspecified peptic ulcer, site unspecified, with hemorrhage: Secondary | ICD-10-CM | POA: Diagnosis not present

## 2019-03-03 DIAGNOSIS — I110 Hypertensive heart disease with heart failure: Secondary | ICD-10-CM | POA: Insufficient documentation

## 2019-03-03 DIAGNOSIS — R58 Hemorrhage, not elsewhere classified: Secondary | ICD-10-CM

## 2019-03-03 DIAGNOSIS — L03311 Cellulitis of abdominal wall: Secondary | ICD-10-CM | POA: Diagnosis not present

## 2019-03-03 DIAGNOSIS — I5033 Acute on chronic diastolic (congestive) heart failure: Secondary | ICD-10-CM | POA: Diagnosis not present

## 2019-03-03 DIAGNOSIS — I4891 Unspecified atrial fibrillation: Secondary | ICD-10-CM | POA: Diagnosis not present

## 2019-03-03 DIAGNOSIS — L98499 Non-pressure chronic ulcer of skin of other sites with unspecified severity: Secondary | ICD-10-CM | POA: Insufficient documentation

## 2019-03-03 DIAGNOSIS — E1165 Type 2 diabetes mellitus with hyperglycemia: Secondary | ICD-10-CM | POA: Diagnosis not present

## 2019-03-03 DIAGNOSIS — S31109D Unspecified open wound of abdominal wall, unspecified quadrant without penetration into peritoneal cavity, subsequent encounter: Secondary | ICD-10-CM | POA: Diagnosis not present

## 2019-03-03 DIAGNOSIS — I130 Hypertensive heart and chronic kidney disease with heart failure and stage 1 through stage 4 chronic kidney disease, or unspecified chronic kidney disease: Secondary | ICD-10-CM | POA: Diagnosis not present

## 2019-03-03 DIAGNOSIS — R42 Dizziness and giddiness: Secondary | ICD-10-CM | POA: Diagnosis not present

## 2019-03-03 DIAGNOSIS — F329 Major depressive disorder, single episode, unspecified: Secondary | ICD-10-CM | POA: Diagnosis not present

## 2019-03-03 DIAGNOSIS — N183 Chronic kidney disease, stage 3 (moderate): Secondary | ICD-10-CM | POA: Diagnosis not present

## 2019-03-03 DIAGNOSIS — I959 Hypotension, unspecified: Secondary | ICD-10-CM | POA: Diagnosis not present

## 2019-03-03 LAB — I-STAT CHEM 8, ED
BUN: 20 mg/dL (ref 8–23)
Calcium, Ion: 1.1 mmol/L — ABNORMAL LOW (ref 1.15–1.40)
Chloride: 95 mmol/L — ABNORMAL LOW (ref 98–111)
Creatinine, Ser: 0.9 mg/dL (ref 0.44–1.00)
Glucose, Bld: 259 mg/dL — ABNORMAL HIGH (ref 70–99)
HCT: 24 % — ABNORMAL LOW (ref 36.0–46.0)
Hemoglobin: 8.2 g/dL — ABNORMAL LOW (ref 12.0–15.0)
Potassium: 3.8 mmol/L (ref 3.5–5.1)
Sodium: 139 mmol/L (ref 135–145)
TCO2: 29 mmol/L (ref 22–32)

## 2019-03-03 MED ORDER — ONDANSETRON 4 MG PO TBDP
4.0000 mg | ORAL_TABLET | Freq: Once | ORAL | Status: AC
  Administered 2019-03-03: 4 mg via ORAL
  Filled 2019-03-03: qty 1

## 2019-03-03 MED ORDER — ACETAMINOPHEN 325 MG PO TABS
650.0000 mg | ORAL_TABLET | Freq: Once | ORAL | Status: AC
  Administered 2019-03-03: 650 mg via ORAL
  Filled 2019-03-03: qty 2

## 2019-03-03 NOTE — ED Triage Notes (Signed)
Pt has a hole in her stomach that has been bleeding for the last few weeks. PCP is not sure where this hole came from. Is due for a biopsy soon. Per EMS, pt had saturated 5 towels while at home a lost "a lot of blood." Pt alert and oriented. NAD.

## 2019-03-03 NOTE — Discharge Instructions (Signed)
Leave packing in until tomorrow.  Then you can remove the packing.  If bleeding starts back again you could put 1 of those special packings back in.  Follow-up if any problems

## 2019-03-03 NOTE — ED Notes (Signed)
No further bleeding from wound

## 2019-03-03 NOTE — ED Provider Notes (Signed)
[facility] EMERGENCY DEPARTMENT Provider Note   CSN: [id number] Arrival date & time: 03/03/19  1013     History   Chief Complaint Chief Complaint  Patient presents with  . BLOOD LOSS    HPI [name] is a 68 y.o. female.     Patient is bleeding from an ulcer on her abdomen.  She is going to get that biopsied next week  The history is provided by the patient.  Weakness Severity:  Moderate Onset quality:  Sudden Timing:  Constant Progression:  Worsening Chronicity:  Recurrent Context: not alcohol use   Relieved by:  Nothing Worsened by:  Nothing Ineffective treatments:  None tried Associated symptoms: no abdominal pain, no chest pain, no cough, no diarrhea, no frequency, no headaches and no seizures     Past Medical History:  Diagnosis Date  . Atrial fibrillation ([place])   . CHF (congestive heart failure) ([place])   . Depression   . Diabetes mellitus without complication ([place])   . 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 Hypertension   . Morbid obesity ([place])   . Osteoporosis   . Urinary incontinence   . UTI (lower urinary tract infection) 01/2016   Cipro for Klebsiella pneumoniae isolate  . Wheelchair bound    bedbound    Patient Active Problem List   Diagnosis Date Noted  . Acute pulmonary edema ([place]) 02/17/2019  . Iron deficiency anemia 02/01/2019  . Skin ulcer of abdomen with fat layer exposed ([place]) 01/16/2019  . Elevated troponin   . Nausea and vomiting   . Symptomatic bradycardia 01/10/2019  . DKA (diabetic ketoacidoses) ([place]) 01/10/2019  . Urinary incontinence 12/28/2018  . Recurrent urinary tract infection 12/16/2018  . Wound, open, anterior abdominal wall 12/05/2018  . Hypotension due to hypovolemia   . Gastritis due to nonsteroidal anti-inflammatory drug   . Esophageal dysphagia   . GI bleed 09/03/2018  . Coagulopathy ([place])   . Colitis   . Lower abdominal pain   . Rectal bleeding   . Dehydration  08/16/2018  . Acute on chronic diastolic CHF (congestive heart failure) ([place]) 08/10/2018  . Acute lower UTI 08/10/2018  . Hypokalemia 08/10/2018  . COPD (chronic obstructive pulmonary disease) ([place]) 08/10/2018  . Thyroid lesion 08/10/2018  . Closed fracture of right ankle 06/10/2018  . Closed fracture of left tibia and fibula with routine healing 04/13/18 06/10/2018  . Atrial fibrillation ([place]) 04/15/2018  . Chronic diastolic CHF (congestive heart failure) ([place]) 04/15/2018  . CKD (chronic kidney disease), stage III ([place]) 04/15/2018  . Closed right fibular fracture 04/15/2018  . Left tibial fracture 04/14/2018  . CHF exacerbation ([place]) 04/10/2018  . SOB (shortness of breath)   . Acute CHF (congestive heart failure) ([place]) 04/01/2018  . Atrial fibrillation with RVR ([place]) 04/01/2018  . [place] 03/11/2016  . Muscle spasms of lower extremity 03/04/2016  . Acute renal insufficiency 02/26/2016  . History of MRSA infection 02/26/2016  . Diabetes type 2, uncontrolled ([place]) 02/19/2016  . HTN (hypertension) 02/19/2016  . Fracture, femur, distal ([place]) 02/19/2016  . Fall 02/19/2016  . Depression 02/19/2016  . Closed left femoral fracture, initial encounter 02/19/2016  . Femur fracture, left ([place]) 02/19/2016    Past Surgical History:  Procedure Laterality Date  . ABDOMINAL HYSTERECTOMY    . BIOPSY  09/06/2018   Procedure: BIOPSY;  Surgeon: Danie Binder, MD;  Location: [facility] ENDO [facility];  Service: Endoscopy;;  gastric bx's  . CESAREAN SECTION    .  CHOLECYSTECTOMY    . ESOPHAGEAL DILATION  09/06/2018   Procedure: ESOPHAGEAL DILATION;  Surgeon: Danie Binder, MD;  Location: [facility] END[facility];  Service: Endoscopy;;  . ESOPHAGOGASTRODUODENOSCOPY (EGD) WITH PROPOFOL N/A 09/06/2018   Procedure: ESOPHAGOGASTRODUODENOSCOPY (EGD) WITH PROPOFOL;  Surgeon: Danie Binder, MD;  Location: [facility];  Service: Endoscopy;  Laterality: N/A;  dilatation  . FEMUR IM NAIL Left 02/20/2016  . FEMUR IM NAIL Left  02/20/2016   Procedure: INTRAMEDULLARY (IM) RETROGRADE FEMORAL NAILING;  Surgeon: Leandrew Koyanagi, MD;  Location: [place];  Service: Orthopedics;  Laterality: Left;  . PANCREAS SURGERY  1967   1 cyst excised and one cyst drained  . TEE WITHOUT CARDIOVERSION N/A 04/05/2018   Procedure: TRANSESOPHAGEAL ECHOCARDIOGRAM (TEE);  Surgeon: Dorothy Spark, MD;  Location: [place];  Service: Cardiovascular;  Laterality: N/A;  . [place]    . WRIST FRACTURE SURGERY       OB History   No obstetric history on file.      Home Medications    Prior to Admission medications   Medication Sig Start Date End Date Taking? Authorizing Provider  acetaminophen (TYLENOL) 500 MG tablet Take 500-1,000 mg by mouth every 8 (eight) hours as needed for mild pain or headache.    Yes [provider]  albuterol (PROVENTIL HFA;VENTOLIN HFA) 108 (90 Base) MCG/ACT inhaler Inhale 2 puffs into the lungs every 6 (six) hours as needed for wheezing or shortness of breath. Patient taking differently: Inhale 2 puffs into the lungs every 6 (six) hours as needed for wheezing or shortness of breath (As needed).  08/14/18  Yes Kathie Dike, MD  apixaban (ELIQUIS) 5 MG TABS tablet Take 1 tablet (5 mg total) by mouth 2 (two) times daily. 07/21/18  Yes Herminio Commons, MD  buPROPion (WELLBUTRIN XL) 300 MG 24 hr tablet Take 300 mg by mouth daily.  01/31/19  Yes [provider]  diltiazem (CARDIZEM CD) 120 MG 24 hr capsule Take 1 capsule (120 mg total) by mouth daily. Patient taking differently: Take 120 mg by mouth daily. Cartia XT 09/10/18  Yes Johnson, Clanford L, MD  escitalopram (LEXAPRO) 10 MG tablet Take 10 mg by mouth daily. 12/28/18  Yes [provider]  ferrous sulfate (FERROUSUL) 325 (65 FE) MG tablet Take 1 tablet (325 mg total) by mouth 2 (two) times daily with a meal. 01/16/19  Yes Domenic Polite, MD  fluticasone [facility]) 50 MCG/ACT nasal spray Place 2 sprays into both  nostrils daily as needed for allergies or rhinitis (congestion).    Yes [provider]  furosemide (LASIX) 20 MG tablet Take 3 tablets (60 mg total) by mouth 2 (two) times daily. 02/19/19 03/21/19 Yes Shah, Pratik D, DO  glipiZIDE (GLUCOTROL) 10 MG tablet Take 10 mg by mouth 2 (two) times daily. 03/02/18  Yes [provider]  levalbuterol Penne Lash) 0.63 MG/3ML nebulizer solution Take 0.63 mg by nebulization every 6 (six) hours as needed for wheezing or shortness of breath.  02/08/19  Yes [provider]  LEVEMIR FLEXTOUCH 100 UNIT/ML Pen Inject 10 Units into the skin 2 (two) times a day. 01/05/19  Yes [provider]  metoprolol tartrate (LOPRESSOR) 50 MG tablet Take 50 mg by mouth 2 (two) times daily.  02/12/19  Yes [provider]  nystatin (MYCOSTATIN/NYSTOP) powder Apply topically daily as needed (rash in skin folds).   Yes [provider]  OVER THE COUNTER MEDICATION Apply 1 application topically daily as needed (  rash in skin folds). Baby butt paste   Yes [provider]  pantoprazole (PROTONIX) 40 MG tablet Take 1 tablet (40 mg total) by mouth daily before breakfast. Patient taking differently: Take 40 mg by mouth daily as needed (for GERD/acid reflux).  09/11/18  Yes Murlean Iba, MD    Family History Family History  Problem Relation Age of Onset  . Diabetes Mother        died in her 66's of a stroke  . Cancer Mother   . Stroke Mother   . Breast cancer Mother   . Diabetes Father        died in his 25's of a stroke.  . Stroke Father   . Diabetes Brother        died @ 68 of a stroke.  . Stroke Brother   . Diabetes Maternal Grandmother   . Diabetes Maternal Grandfather   . Diabetes Paternal Grandmother   . Diabetes Paternal Grandfather   . Breast cancer Maternal Aunt     Social History Social History   Tobacco Use  . Smoking status: Never Smoker  . Smokeless tobacco: Never Used  Substance Use Topics  . Alcohol  use: No  . Drug use: No     Allergies   Feraheme [ferumoxytol], Propranolol, Topamax [topiramate], and Latex   Review of Systems Review of Systems  Constitutional: Negative for appetite change and fatigue.  HENT: Negative for congestion, ear discharge and sinus pressure.   Eyes: Negative for discharge.  Respiratory: Negative for cough.   Cardiovascular: Negative for chest pain.  Gastrointestinal: Negative for abdominal pain and diarrhea.  Genitourinary: Negative for frequency and hematuria.  Musculoskeletal: Negative for back pain.  Skin: Negative for rash.       Bleeding from ulcer  Neurological: Positive for weakness. Negative for seizures and headaches.  Psychiatric/Behavioral: Negative for hallucinations.     Physical Exam Updated Vital Signs BP (!) 136/43   Pulse (!) 58   Temp 98.1 F (36.7 C) (Oral)   Ht 5' 4"  (1.626 m)   Wt 97.5 kg   SpO2 95%   BMI 36.90 kg/m   Physical Exam Vitals signs and nursing note reviewed.  Constitutional:      Appearance: She is well-developed.  HENT:     Head: Normocephalic.     Nose: Nose normal.  Eyes:     General: No scleral icterus.    Conjunctiva/sclera: Conjunctivae normal.  Neck:     Musculoskeletal: Neck supple.     Thyroid: No thyromegaly.  Cardiovascular:     Rate and Rhythm: Normal rate and regular rhythm.     Heart sounds: No murmur. No friction rub. No gallop.   Pulmonary:     Breath sounds: No stridor. No wheezing or rales.  Chest:     Chest wall: No tenderness.  Abdominal:     General: There is no distension.     Tenderness: There is no abdominal tenderness. There is no rebound.  Musculoskeletal: Normal range of motion.  Lymphadenopathy:     Cervical: No cervical adenopathy.  Skin:    Findings: No erythema or rash.     Comments: 1 cm ulcer bleeding  Neurological:     Mental Status: She is oriented to person, place, and time.     Motor: No abnormal muscle tone.     Coordination: Coordination normal.   Psychiatric:        Behavior: Behavior normal.      ED Treatments /  Results  Labs (all labs ordered are listed, but only abnormal results are displayed) Labs Reviewed  I-STAT CHEM 8, ED - Abnormal; Notable for the following components:      Result Value   Chloride 95 (*)    Glucose, Bld 259 (*)    Calcium, Ion 1.10 (*)    Hemoglobin 8.2 (*)    HCT 24.0 (*)    All other components within normal limits    EKG None  Radiology No results found.  Procedures .Marland KitchenLaceration Repair  Date/Time: 03/03/2019 11:45 AM Performed by: Milton Ferguson, MD Authorized by: Milton Ferguson, MD   Comments:     Patient had bleeding from an ulcer in her abdomen that was about 1 cm diameter area.  Quick clot was put into the ulcer and stop the bleeding.   (including critical care time)  Medications Ordered in ED Medications  ondansetron (ZOFRAN-ODT) disintegrating tablet 4 mg (4 mg Oral Given 03/03/19 1123)  acetaminophen (TYLENOL) tablet 650 mg (650 mg Oral Given 03/03/19 1123)     Initial Impression / Assessment and Plan / ED Course  I have reviewed the triage vital signs and the nursing notes.  Pertinent labs & imaging results that were available during my care of the patient were reviewed by me and considered in my medical decision making (see chart for details).        Bleeding from ulcer which was treated with quick clot.  Patient will be discharged home  Final Clinical Impressions(s) / ED Diagnoses   Final diagnoses:  Bleeding    ED Discharge Orders    None       Milton Ferguson, MD 03/03/19 1146

## 2019-03-04 ENCOUNTER — Ambulatory Visit: Payer: Self-pay

## 2019-03-04 DIAGNOSIS — N183 Chronic kidney disease, stage 3 (moderate): Secondary | ICD-10-CM | POA: Diagnosis not present

## 2019-03-04 DIAGNOSIS — J449 Chronic obstructive pulmonary disease, unspecified: Secondary | ICD-10-CM | POA: Diagnosis not present

## 2019-03-04 DIAGNOSIS — Z6841 Body Mass Index (BMI) 40.0 and over, adult: Secondary | ICD-10-CM | POA: Diagnosis not present

## 2019-03-04 DIAGNOSIS — I5033 Acute on chronic diastolic (congestive) heart failure: Secondary | ICD-10-CM | POA: Diagnosis not present

## 2019-03-04 DIAGNOSIS — F329 Major depressive disorder, single episode, unspecified: Secondary | ICD-10-CM | POA: Diagnosis not present

## 2019-03-04 DIAGNOSIS — L03311 Cellulitis of abdominal wall: Secondary | ICD-10-CM | POA: Diagnosis not present

## 2019-03-04 DIAGNOSIS — E1122 Type 2 diabetes mellitus with diabetic chronic kidney disease: Secondary | ICD-10-CM | POA: Diagnosis not present

## 2019-03-04 DIAGNOSIS — I130 Hypertensive heart and chronic kidney disease with heart failure and stage 1 through stage 4 chronic kidney disease, or unspecified chronic kidney disease: Secondary | ICD-10-CM | POA: Diagnosis not present

## 2019-03-04 DIAGNOSIS — E1165 Type 2 diabetes mellitus with hyperglycemia: Secondary | ICD-10-CM | POA: Diagnosis not present

## 2019-03-04 DIAGNOSIS — I482 Chronic atrial fibrillation, unspecified: Secondary | ICD-10-CM | POA: Diagnosis not present

## 2019-03-04 DIAGNOSIS — S31109D Unspecified open wound of abdominal wall, unspecified quadrant without penetration into peritoneal cavity, subsequent encounter: Secondary | ICD-10-CM | POA: Diagnosis not present

## 2019-03-04 DIAGNOSIS — Z87440 Personal history of urinary (tract) infections: Secondary | ICD-10-CM | POA: Diagnosis not present

## 2019-03-07 ENCOUNTER — Inpatient Hospital Stay (HOSPITAL_COMMUNITY)
Admission: EM | Admit: 2019-03-07 | Discharge: 2019-03-11 | DRG: 571 | Disposition: A | Discharge status: Home Health | Payer: BC Managed Care – PPO | Attending: Family Medicine | Admitting: Family Medicine

## 2019-03-07 ENCOUNTER — Other Ambulatory Visit: Payer: Self-pay

## 2019-03-07 ENCOUNTER — Ambulatory Visit: Payer: BC Managed Care – PPO

## 2019-03-07 ENCOUNTER — Emergency Department (HOSPITAL_COMMUNITY): Payer: BC Managed Care – PPO

## 2019-03-07 DIAGNOSIS — D509 Iron deficiency anemia, unspecified: Secondary | ICD-10-CM | POA: Diagnosis present

## 2019-03-07 DIAGNOSIS — J449 Chronic obstructive pulmonary disease, unspecified: Secondary | ICD-10-CM | POA: Diagnosis not present

## 2019-03-07 DIAGNOSIS — S31109A Unspecified open wound of abdominal wall, unspecified quadrant without penetration into peritoneal cavity, initial encounter: Secondary | ICD-10-CM | POA: Diagnosis not present

## 2019-03-07 DIAGNOSIS — E1122 Type 2 diabetes mellitus with diabetic chronic kidney disease: Secondary | ICD-10-CM | POA: Diagnosis present

## 2019-03-07 DIAGNOSIS — I5032 Chronic diastolic (congestive) heart failure: Secondary | ICD-10-CM | POA: Diagnosis not present

## 2019-03-07 DIAGNOSIS — Z87440 Personal history of urinary (tract) infections: Secondary | ICD-10-CM | POA: Diagnosis not present

## 2019-03-07 DIAGNOSIS — Z823 Family history of stroke: Secondary | ICD-10-CM | POA: Diagnosis not present

## 2019-03-07 DIAGNOSIS — L089 Local infection of the skin and subcutaneous tissue, unspecified: Secondary | ICD-10-CM | POA: Diagnosis present

## 2019-03-07 DIAGNOSIS — Z8614 Personal history of Methicillin resistant Staphylococcus aureus infection: Secondary | ICD-10-CM | POA: Diagnosis present

## 2019-03-07 DIAGNOSIS — Z833 Family history of diabetes mellitus: Secondary | ICD-10-CM

## 2019-03-07 DIAGNOSIS — M810 Age-related osteoporosis without current pathological fracture: Secondary | ICD-10-CM | POA: Diagnosis not present

## 2019-03-07 DIAGNOSIS — I130 Hypertensive heart and chronic kidney disease with heart failure and stage 1 through stage 4 chronic kidney disease, or unspecified chronic kidney disease: Secondary | ICD-10-CM | POA: Diagnosis present

## 2019-03-07 DIAGNOSIS — T148XXA Other injury of unspecified body region, initial encounter: Secondary | ICD-10-CM | POA: Diagnosis not present

## 2019-03-07 DIAGNOSIS — Z1159 Encounter for screening for other viral diseases: Secondary | ICD-10-CM

## 2019-03-07 DIAGNOSIS — L98499 Non-pressure chronic ulcer of skin of other sites with unspecified severity: Secondary | ICD-10-CM | POA: Diagnosis present

## 2019-03-07 DIAGNOSIS — M7989 Other specified soft tissue disorders: Secondary | ICD-10-CM | POA: Diagnosis not present

## 2019-03-07 DIAGNOSIS — F329 Major depressive disorder, single episode, unspecified: Secondary | ICD-10-CM | POA: Diagnosis not present

## 2019-03-07 DIAGNOSIS — I482 Chronic atrial fibrillation, unspecified: Secondary | ICD-10-CM | POA: Diagnosis present

## 2019-03-07 DIAGNOSIS — D500 Iron deficiency anemia secondary to blood loss (chronic): Secondary | ICD-10-CM | POA: Diagnosis not present

## 2019-03-07 DIAGNOSIS — E1165 Type 2 diabetes mellitus with hyperglycemia: Secondary | ICD-10-CM | POA: Diagnosis not present

## 2019-03-07 DIAGNOSIS — L88 Pyoderma gangrenosum: Secondary | ICD-10-CM | POA: Diagnosis not present

## 2019-03-07 DIAGNOSIS — Z803 Family history of malignant neoplasm of breast: Secondary | ICD-10-CM | POA: Diagnosis not present

## 2019-03-07 DIAGNOSIS — Z79899 Other long term (current) drug therapy: Secondary | ICD-10-CM

## 2019-03-07 DIAGNOSIS — L98491 Non-pressure chronic ulcer of skin of other sites limited to breakdown of skin: Secondary | ICD-10-CM | POA: Diagnosis not present

## 2019-03-07 DIAGNOSIS — Z6841 Body Mass Index (BMI) 40.0 and over, adult: Secondary | ICD-10-CM | POA: Diagnosis not present

## 2019-03-07 DIAGNOSIS — L98492 Non-pressure chronic ulcer of skin of other sites with fat layer exposed: Secondary | ICD-10-CM | POA: Diagnosis not present

## 2019-03-07 DIAGNOSIS — N183 Chronic kidney disease, stage 3 (moderate): Secondary | ICD-10-CM | POA: Diagnosis present

## 2019-03-07 DIAGNOSIS — Z7901 Long term (current) use of anticoagulants: Secondary | ICD-10-CM

## 2019-03-07 DIAGNOSIS — E11649 Type 2 diabetes mellitus with hypoglycemia without coma: Secondary | ICD-10-CM | POA: Diagnosis not present

## 2019-03-07 DIAGNOSIS — Z993 Dependence on wheelchair: Secondary | ICD-10-CM | POA: Diagnosis not present

## 2019-03-07 DIAGNOSIS — S31109D Unspecified open wound of abdominal wall, unspecified quadrant without penetration into peritoneal cavity, subsequent encounter: Secondary | ICD-10-CM | POA: Diagnosis not present

## 2019-03-07 DIAGNOSIS — I5033 Acute on chronic diastolic (congestive) heart failure: Secondary | ICD-10-CM | POA: Diagnosis not present

## 2019-03-07 DIAGNOSIS — Z888 Allergy status to other drugs, medicaments and biological substances status: Secondary | ICD-10-CM

## 2019-03-07 DIAGNOSIS — Z03818 Encounter for observation for suspected exposure to other biological agents ruled out: Secondary | ICD-10-CM | POA: Diagnosis not present

## 2019-03-07 DIAGNOSIS — L03311 Cellulitis of abdominal wall: Secondary | ICD-10-CM | POA: Diagnosis not present

## 2019-03-07 DIAGNOSIS — I10 Essential (primary) hypertension: Secondary | ICD-10-CM | POA: Diagnosis present

## 2019-03-07 DIAGNOSIS — D508 Other iron deficiency anemias: Secondary | ICD-10-CM | POA: Diagnosis not present

## 2019-03-07 DIAGNOSIS — Z91040 Latex allergy status: Secondary | ICD-10-CM

## 2019-03-07 DIAGNOSIS — Z6835 Body mass index (BMI) 35.0-35.9, adult: Secondary | ICD-10-CM

## 2019-03-07 LAB — URINALYSIS, ROUTINE W REFLEX MICROSCOPIC
Bilirubin Urine: NEGATIVE
Glucose, UA: NEGATIVE mg/dL
Hgb urine dipstick: NEGATIVE
Ketones, ur: NEGATIVE mg/dL
Nitrite: NEGATIVE
Protein, ur: 100 mg/dL — AB
Specific Gravity, Urine: 1.024 (ref 1.005–1.030)
WBC, UA: >50 WBC/hpf — ABNORMAL HIGH (ref 0–5)
pH: 6 (ref 5.0–8.0)

## 2019-03-07 LAB — CBC WITH DIFFERENTIAL/PLATELET
Abs Immature Granulocytes: 0.06 10*3/uL (ref 0.00–0.07)
Basophils Absolute: 0 10*3/uL (ref 0.0–0.1)
Basophils Relative: 0 %
Eosinophils Absolute: 0.2 10*3/uL (ref 0.0–0.5)
Eosinophils Relative: 2 %
HCT: 23.8 % — ABNORMAL LOW (ref 36.0–46.0)
Hemoglobin: 6.8 g/dL — CL (ref 12.0–15.0)
Immature Granulocytes: 1 %
Lymphocytes Relative: 13 %
Lymphs Abs: 1.4 10*3/uL (ref 0.7–4.0)
MCH: 27.4 pg (ref 26.0–34.0)
MCHC: 28.6 g/dL — ABNORMAL LOW (ref 30.0–36.0)
MCV: 96 fL (ref 80.0–100.0)
Monocytes Absolute: 0.9 10*3/uL (ref 0.1–1.0)
Monocytes Relative: 8 %
Neutro Abs: 7.9 10*3/uL — ABNORMAL HIGH (ref 1.7–7.7)
Neutrophils Relative %: 76 %
Platelets: 307 10*3/uL (ref 150–400)
RBC: 2.48 MIL/uL — ABNORMAL LOW (ref 3.87–5.11)
RDW: 19.6 % — ABNORMAL HIGH (ref 11.5–15.5)
WBC: 10.5 10*3/uL (ref 4.0–10.5)
nRBC: 0 % (ref 0.0–0.2)

## 2019-03-07 LAB — GLUCOSE, CAPILLARY: Glucose-Capillary: 276 mg/dL — ABNORMAL HIGH (ref 70–99)

## 2019-03-07 LAB — BASIC METABOLIC PANEL
Anion gap: 10 (ref 5–15)
BUN: 20 mg/dL (ref 8–23)
CO2: 24 mmol/L (ref 22–32)
Calcium: 7.8 mg/dL — ABNORMAL LOW (ref 8.9–10.3)
Chloride: 101 mmol/L (ref 98–111)
Creatinine, Ser: 0.81 mg/dL (ref 0.44–1.00)
GFR calc Af Amer: >60 mL/min (ref >60)
GFR calc non Af Amer: >60 mL/min (ref >60)
Glucose, Bld: 237 mg/dL — ABNORMAL HIGH (ref 70–99)
Potassium: 3.7 mmol/L (ref 3.5–5.1)
Sodium: 135 mmol/L (ref 135–145)

## 2019-03-07 LAB — SARS CORONAVIRUS 2 BY RT PCR (HOSPITAL ORDER, PERFORMED IN ~~LOC~~ HOSPITAL LAB): SARS Coronavirus 2: NEGATIVE

## 2019-03-07 MED ORDER — ESCITALOPRAM OXALATE 10 MG PO TABS
10.0000 mg | ORAL_TABLET | Freq: Every day | ORAL | Status: DC
  Administered 2019-03-08 – 2019-03-11 (×3): 10 mg via ORAL
  Filled 2019-03-07 (×3): qty 1

## 2019-03-07 MED ORDER — FUROSEMIDE 10 MG/ML IJ SOLN
40.0000 mg | Freq: Once | INTRAMUSCULAR | Status: AC
  Administered 2019-03-08: 40 mg via INTRAVENOUS
  Filled 2019-03-07: qty 4

## 2019-03-07 MED ORDER — ACETAMINOPHEN 650 MG RE SUPP: 650.0000 mg | Freq: Four times a day (QID) | RECTAL | Status: DC | PRN

## 2019-03-07 MED ORDER — IOHEXOL 300 MG/ML  SOLN
100.0000 mL | Freq: Once | INTRAMUSCULAR | Status: AC | PRN
  Administered 2019-03-07: 100 mL via INTRAVENOUS

## 2019-03-07 MED ORDER — BUPROPION HCL ER (XL) 300 MG PO TB24
300.0000 mg | ORAL_TABLET | Freq: Every day | ORAL | Status: DC
  Administered 2019-03-08 – 2019-03-11 (×3): 300 mg via ORAL
  Filled 2019-03-07 (×4): qty 1

## 2019-03-07 MED ORDER — ONDANSETRON HCL 4 MG/2ML IJ SOLN
4.0000 mg | Freq: Four times a day (QID) | INTRAMUSCULAR | Status: DC | PRN
  Administered 2019-03-08: 4 mg via INTRAVENOUS
  Filled 2019-03-07: qty 2

## 2019-03-07 MED ORDER — PIPERACILLIN-TAZOBACTAM 3.375 G IVPB 30 MIN
3.3750 g | Freq: Once | INTRAVENOUS | Status: AC
  Administered 2019-03-07: 23:00:00 3.375 g via INTRAVENOUS
  Filled 2019-03-07: qty 50

## 2019-03-07 MED ORDER — ACETAMINOPHEN 325 MG PO TABS
650.0000 mg | ORAL_TABLET | Freq: Four times a day (QID) | ORAL | Status: DC | PRN
  Administered 2019-03-08: 650 mg via ORAL
  Filled 2019-03-07 (×2): qty 2

## 2019-03-07 MED ORDER — SODIUM CHLORIDE 0.9 % IV BOLUS
500.0000 mL | Freq: Once | INTRAVENOUS | Status: AC
  Administered 2019-03-07: 500 mL via INTRAVENOUS

## 2019-03-07 MED ORDER — VANCOMYCIN HCL IN DEXTROSE 1-5 GM/200ML-% IV SOLN
1000.0000 mg | Freq: Two times a day (BID) | INTRAVENOUS | Status: DC
  Administered 2019-03-08 – 2019-03-09 (×2): 1000 mg via INTRAVENOUS
  Filled 2019-03-07 (×3): qty 200

## 2019-03-07 MED ORDER — PIPERACILLIN-TAZOBACTAM 3.375 G IVPB
3.3750 g | Freq: Three times a day (TID) | INTRAVENOUS | Status: DC
  Administered 2019-03-08 – 2019-03-10 (×9): 3.375 g via INTRAVENOUS
  Filled 2019-03-07 (×9): qty 50

## 2019-03-07 MED ORDER — POLYETHYLENE GLYCOL 3350 17 G PO PACK: 17.0000 g | PACK | Freq: Every day | ORAL | Status: DC | PRN

## 2019-03-07 MED ORDER — INSULIN DETEMIR 100 UNIT/ML ~~LOC~~ SOLN
8.0000 [IU] | Freq: Two times a day (BID) | SUBCUTANEOUS | Status: DC
  Administered 2019-03-08: 8 [IU] via SUBCUTANEOUS
  Filled 2019-03-07 (×2): qty 0.08

## 2019-03-07 MED ORDER — DILTIAZEM HCL ER COATED BEADS 120 MG PO CP24
120.0000 mg | ORAL_CAPSULE | Freq: Every day | ORAL | Status: DC
  Administered 2019-03-08 – 2019-03-11 (×3): 120 mg via ORAL
  Filled 2019-03-07 (×4): qty 1

## 2019-03-07 MED ORDER — INSULIN ASPART 100 UNIT/ML ~~LOC~~ SOLN
0.0000 [IU] | SUBCUTANEOUS | Status: DC
  Administered 2019-03-08: 8 [IU] via SUBCUTANEOUS
  Administered 2019-03-08: 2 [IU] via SUBCUTANEOUS
  Administered 2019-03-09 (×2): 3 [IU] via SUBCUTANEOUS
  Administered 2019-03-10 (×3): 2 [IU] via SUBCUTANEOUS

## 2019-03-07 MED ORDER — SODIUM CHLORIDE 0.9% IV SOLUTION
Freq: Once | INTRAVENOUS | Status: AC
  Administered 2019-03-08: 02:00:00 via INTRAVENOUS

## 2019-03-07 MED ORDER — FERROUS SULFATE 325 (65 FE) MG PO TABS
325.0000 mg | ORAL_TABLET | Freq: Two times a day (BID) | ORAL | Status: DC
  Administered 2019-03-08 – 2019-03-11 (×6): 325 mg via ORAL
  Filled 2019-03-07 (×7): qty 1

## 2019-03-07 MED ORDER — IPRATROPIUM-ALBUTEROL 0.5-2.5 (3) MG/3ML IN SOLN: 3.0000 mL | RESPIRATORY_TRACT | Status: DC | PRN

## 2019-03-07 MED ORDER — ONDANSETRON HCL 4 MG PO TABS: 4.0000 mg | ORAL_TABLET | Freq: Four times a day (QID) | ORAL | Status: DC | PRN

## 2019-03-07 MED ORDER — METOPROLOL TARTRATE 50 MG PO TABS
50.0000 mg | ORAL_TABLET | Freq: Two times a day (BID) | ORAL | Status: DC
  Administered 2019-03-08 – 2019-03-11 (×5): 50 mg via ORAL
  Filled 2019-03-07 (×7): qty 1

## 2019-03-07 MED ORDER — VANCOMYCIN HCL IN DEXTROSE 1-5 GM/200ML-% IV SOLN
1000.0000 mg | INTRAVENOUS | Status: AC
  Administered 2019-03-07 (×2): 1000 mg via INTRAVENOUS
  Filled 2019-03-07 (×2): qty 200

## 2019-03-07 MED ORDER — FUROSEMIDE 40 MG PO TABS
40.0000 mg | ORAL_TABLET | Freq: Two times a day (BID) | ORAL | Status: DC
  Administered 2019-03-08 – 2019-03-11 (×6): 40 mg via ORAL
  Filled 2019-03-07 (×7): qty 1

## 2019-03-07 NOTE — ED Triage Notes (Signed)
Pt has hole in stomach from before thanksgiving and has been bleeding for the last few weeks. Has been seen here for the same. States she believes it may be infected and has a bad odor.

## 2019-03-07 NOTE — ED Notes (Signed)
purewick in place.

## 2019-03-07 NOTE — Progress Notes (Addendum)
Pharmacy Antibiotic Note  [name] is a 68 y.o. female admitted on 03/07/2019 with cellulitis.  Pharmacy has been consulted for Vancomycin dosing.  Plan: Vancomycin 2000 mg IV x 1 dose. Vancomycin 1000 mg IV every 12 hours.  Goal trough 10-15 mcg/mL.  Zosyn 3.375g IV every 8 hours Monitor labs, c/s, and vanco levels as indicated.  Height: 5' 4"  (162.6 cm) Weight: 207 lb (93.9 kg) IBW/kg (Calculated) : 54.7  Temp (24hrs), Avg:98.1 F (36.7 C), Min:98.1 F (36.7 C), Max:98.1 F (36.7 C)  Recent Labs  Lab 03/03/19 1047  CREATININE 0.90    Estimated Creatinine Clearance: 66.5 mL/min (by C-G formula based on SCr of 0.9 mg/dL).    Allergies  Allergen Reactions  . Feraheme [Ferumoxytol] Other (See Comments)    Back pain (yelling out with back pain)  . Propranolol Swelling    Pt states it may have been leg swelling  . Topamax [Topiramate] Other (See Comments)    hallucinations  . Latex Itching and Rash    Antimicrobials this admission: Vanco 7/20 >>  Zosyn 7/20 >>     Dose adjustments this admission: N/A  Microbiology results: 7/20 BCx: pending  7/20 Wound Cx: pending    Thank you for allowing pharmacy to be a part of this patient's care.  Ramond Craver 03/07/2019 7:24 PM

## 2019-03-07 NOTE — ED Provider Notes (Signed)
[facility] EMERGENCY DEPARTMENT Provider Note   CSN: [id number] Arrival date & time: 03/07/19  1459    History   Chief Complaint Chief Complaint  Patient presents with  . Wound Check    HPI [name] is a 68 y.o. female.     68yo female with multiple medical problems listed below, on Xarelto, presents with complaint of foul smelling purulent drainage from chronic stomach wounds for the past two weeks, progressively worsening. Patient was seen in the ER a few days ago, combat gauze applied to bleeding wounds. Patient is scheduled to biopsy of these areas at the end of the week. Patient denies recent CT of the area, no known cultures although reports antibiotic resistance problems previously as well as MRSA + in the past. Patient reports pain in the area of the wound, denies generalized abdominal pain, reports feeling nauseous from the small, no vomiting, no fevers, no chills. Patient states she feels like there is air (gas?) coming out of her wound at times.     Past Medical History:  Diagnosis Date  . Atrial fibrillation ([place])   . CHF (congestive heart failure) ([place])   . Depression   . Diabetes mellitus without complication ([place])   . 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 Hypertension   . Morbid obesity ([place])   . Osteoporosis   . Urinary incontinence   . UTI (lower urinary tract infection) 01/2016   Cipro for Klebsiella pneumoniae isolate  . Wheelchair bound    bedbound    Patient Active Problem List   Diagnosis Date Noted  . Abdominal wall skin ulcer ([employer]) 03/07/2019  . Acute pulmonary edema ([place]) 02/17/2019  . Iron deficiency anemia 02/01/2019  . Skin ulcer of abdomen with fat layer exposed ([place]) 01/16/2019  . Elevated troponin   . Nausea and vomiting   . Symptomatic bradycardia 01/10/2019  . DKA (diabetic ketoacidoses) ([place]) 01/10/2019  . Urinary incontinence 12/28/2018  . Recurrent urinary tract infection 12/16/2018   . Wound, open, anterior abdominal wall 12/05/2018  . Hypotension due to hypovolemia   . Gastritis due to nonsteroidal anti-inflammatory drug   . Esophageal dysphagia   . GI bleed 09/03/2018  . Coagulopathy ([place])   . Colitis   . Lower abdominal pain   . Rectal bleeding   . Dehydration 08/16/2018  . Acute on chronic diastolic CHF (congestive heart failure) ([place]) 08/10/2018  . Acute lower UTI 08/10/2018  . Hypokalemia 08/10/2018  . COPD (chronic obstructive pulmonary disease) ([place]) 08/10/2018  . Thyroid lesion 08/10/2018  . Closed fracture of right ankle 06/10/2018  . Closed fracture of left tibia and fibula with routine healing 04/13/18 06/10/2018  . Atrial fibrillation ([place]) 04/15/2018  . Chronic diastolic CHF (congestive heart failure) ([place]) 04/15/2018  . CKD (chronic kidney disease), stage III ([place]) 04/15/2018  . Closed right fibular fracture 04/15/2018  . Left tibial fracture 04/14/2018  . CHF exacerbation ([place]) 04/10/2018  . SOB (shortness of breath)   . Acute CHF (congestive heart failure) ([place]) 04/01/2018  . Atrial fibrillation with RVR ([place]) 04/01/2018  . [place] 03/11/2016  . Muscle spasms of lower extremity 03/04/2016  . Acute renal insufficiency 02/26/2016  . History of MRSA infection 02/26/2016  . Diabetes type 2, uncontrolled ([place]) 02/19/2016  . HTN (hypertension) 02/19/2016  . Fracture, femur, distal ([place]) 02/19/2016  . Fall 02/19/2016  . Depression 02/19/2016  . Closed left femoral fracture, initial encounter 02/19/2016  . Femur fracture, left ([place]) 02/19/2016  Past Surgical History:  Procedure Laterality Date  . ABDOMINAL HYSTERECTOMY    . BIOPSY  09/06/2018   Procedure: BIOPSY;  Surgeon: Danie Binder, MD;  Location: [facility];  Service: Endoscopy;;  gastric bx's  . CESAREAN SECTION    . CHOLECYSTECTOMY    . ESOPHAGEAL DILATION  09/06/2018   Procedure: ESOPHAGEAL DILATION;  Surgeon: Danie Binder, MD;  Location: [facility];  Service: Endoscopy;;   . ESOPHAGOGASTRODUODENOSCOPY (EGD) WITH PROPOFOL N/A 09/06/2018   Procedure: ESOPHAGOGASTRODUODENOSCOPY (EGD) WITH PROPOFOL;  Surgeon: Danie Binder, MD;  Location: [facility];  Service: Endoscopy;  Laterality: N/A;  dilatation  . FEMUR IM NAIL Left 02/20/2016  . FEMUR IM NAIL Left 02/20/2016   Procedure: INTRAMEDULLARY (IM) RETROGRADE FEMORAL NAILING;  Surgeon: Leandrew Koyanagi, MD;  Location: [place];  Service: Orthopedics;  Laterality: Left;  . PANCREAS SURGERY  1967   1 cyst excised and one cyst drained  . TEE WITHOUT CARDIOVERSION N/A 04/05/2018   Procedure: TRANSESOPHAGEAL ECHOCARDIOGRAM (TEE);  Surgeon: Dorothy Spark, MD;  Location: [place];  Service: Cardiovascular;  Laterality: N/A;  . [place]    . WRIST FRACTURE SURGERY       OB History   No obstetric history on file.      Home Medications    Prior to Admission medications   Medication Sig Start Date End Date Taking? Authorizing Provider  acetaminophen (TYLENOL) 500 MG tablet Take 500-1,000 mg by mouth every 8 (eight) hours as needed for mild pain or headache.    Yes [provider]  albuterol (PROVENTIL HFA;VENTOLIN HFA) 108 (90 Base) MCG/ACT inhaler Inhale 2 puffs into the lungs every 6 (six) hours as needed for wheezing or shortness of breath. Patient taking differently: Inhale 2 puffs into the lungs every 6 (six) hours as needed for wheezing or shortness of breath (As needed).  08/14/18  Yes Kathie Dike, MD  apixaban (ELIQUIS) 5 MG TABS tablet Take 1 tablet (5 mg total) by mouth 2 (two) times daily. 07/21/18  Yes Herminio Commons, MD  buPROPion (WELLBUTRIN XL) 300 MG 24 hr tablet Take 300 mg by mouth daily.  01/31/19  Yes [provider]  diltiazem (CARDIZEM CD) 120 MG 24 hr capsule Take 1 capsule (120 mg total) by mouth daily. Patient taking differently: Take 120 mg by mouth daily. Cartia XT 09/10/18  Yes Johnson, Clanford L, MD  escitalopram (LEXAPRO) 10 MG tablet Take 10  mg by mouth daily. 12/28/18  Yes [provider]  ferrous sulfate (FERROUSUL) 325 (65 FE) MG tablet Take 1 tablet (325 mg total) by mouth 2 (two) times daily with a meal. 01/16/19  Yes Domenic Polite, MD  fluticasone [facility]) 50 MCG/ACT nasal spray Place 2 sprays into both nostrils daily as needed for allergies or rhinitis (congestion).    Yes [provider]  furosemide (LASIX) 20 MG tablet Take 3 tablets (60 mg total) by mouth 2 (two) times daily. 02/19/19 03/21/19 Yes Shah, Pratik D, DO  glipiZIDE (GLUCOTROL) 10 MG tablet Take 10 mg by mouth 2 (two) times daily. 03/02/18  Yes [provider]  levalbuterol Penne Lash) 0.63 MG/3ML nebulizer solution Take 0.63 mg by nebulization every 6 (six) hours as needed for wheezing or shortness of breath.  02/08/19  Yes [provider]  LEVEMIR FLEXTOUCH 100 UNIT/ML Pen Inject 10 Units into the skin 2 (two) times a day. 01/05/19  Yes [provider]  metoprolol tartrate (LOPRESSOR) 50 MG tablet  Take 50 mg by mouth 2 (two) times daily.  02/12/19  Yes [provider]  nystatin (MYCOSTATIN/NYSTOP) powder Apply topically daily as needed (rash in skin folds).   Yes [provider]  OVER THE COUNTER MEDICATION Apply 1 application topically daily as needed (rash in skin folds). Baby butt paste   Yes [provider]  pantoprazole (PROTONIX) 40 MG tablet Take 1 tablet (40 mg total) by mouth daily before breakfast. Patient taking differently: Take 40 mg by mouth daily as needed (for GERD/acid reflux).  09/11/18  Yes Murlean Iba, MD    Family History Family History  Problem Relation Age of Onset  . Diabetes Mother        died in her 58's of a stroke  . Cancer Mother   . Stroke Mother   . Breast cancer Mother   . Diabetes Father        died in his 61's of a stroke.  . Stroke Father   . Diabetes Brother        died @ 68 of a stroke.  . Stroke Brother   . Diabetes Maternal Grandmother   .  Diabetes Maternal Grandfather   . Diabetes Paternal Grandmother   . Diabetes Paternal Grandfather   . Breast cancer Maternal Aunt     Social History Social History   Tobacco Use  . Smoking status: Never Smoker  . Smokeless tobacco: Never Used  Substance Use Topics  . Alcohol use: No  . Drug use: No     Allergies   Feraheme [ferumoxytol], Propranolol, Topamax [topiramate], and Latex   Review of Systems Review of Systems  Constitutional: Negative for chills and fever.  Respiratory: Negative for shortness of breath.   Cardiovascular: Negative for chest pain.  Gastrointestinal: Positive for nausea. Negative for abdominal pain, constipation, diarrhea and vomiting.  Genitourinary: Negative for dysuria, frequency and urgency.  Musculoskeletal: Negative for arthralgias and myalgias.  Skin: Positive for wound.  Allergic/Immunologic: Positive for immunocompromised state.  Neurological: Negative for dizziness and weakness.  Psychiatric/Behavioral: Negative for confusion.  All other systems reviewed and are negative.    Physical Exam Updated Vital Signs BP (!) 161/52   Pulse (!) 59   Temp 98.1 F (36.7 C) (Oral)   Resp 14   Ht 5' 4"  (1.626 m)   Wt 93.9 kg   SpO2 100%   BMI 35.53 kg/m   Physical Exam Vitals signs and nursing note reviewed.  Constitutional:      General: She is not in acute distress.    Appearance: She is well-developed. She is not diaphoretic.  HENT:     Head: Normocephalic and atraumatic.  Cardiovascular:     Rate and Rhythm: Normal rate and regular rhythm.     Pulses: Normal pulses.     Heart sounds: Normal heart sounds.  Pulmonary:     Effort: Pulmonary effort is normal.     Breath sounds: Normal breath sounds.  Abdominal:     Palpations: Abdomen is soft.     Tenderness: There is abdominal tenderness.  Skin:    General: Skin is warm and dry.     Findings: Erythema present.       Neurological:     Mental Status: She is alert and oriented  to person, place, and time.  Psychiatric:        Behavior: Behavior normal.     Media Information    Media Information    Document Information  Photos  03/07/2019 18:46  Attached To:  Hospital Encounter on 03/07/19  Source Information  Roque Lias  [facility]     Document Information  Photos    03/07/2019 18:46  Attached To:  Hospital Encounter on 03/07/19  Source Information  Roque Lias  [facility]    ED Treatments / Results  Labs (all labs ordered are listed, but only abnormal results are displayed) Labs Reviewed  BASIC METABOLIC PANEL - Abnormal; Notable for the following components:      Result Value   Glucose, Bld 237 (*)    Calcium 7.8 (*)    All other components within normal limits  CBC WITH DIFFERENTIAL/PLATELET - Abnormal; Notable for the following components:   RBC 2.48 (*)    Hemoglobin 6.8 (*)    HCT 23.8 (*)    MCHC 28.6 (*)    RDW 19.6 (*)    Neutro Abs 7.9 (*)    All other components within normal limits  URINALYSIS, ROUTINE W REFLEX MICROSCOPIC - Abnormal; Notable for the following components:   APPearance HAZY (*)    Protein, ur 100 (*)    Leukocytes,Ua MODERATE (*)    WBC, UA >50 (*)    Bacteria, UA FEW (*)    All other components within normal limits  SARS CORONAVIRUS 2 (HOSPITAL ORDER, [facility])  AEROBIC CULTURE (SUPERFICIAL SPECIMEN)  CULTURE, BLOOD (ROUTINE X 2)  CULTURE, BLOOD (ROUTINE X 2)    EKG None  Radiology Ct Abdomen Pelvis W Contrast  Result Date: 03/07/2019 CLINICAL DATA:  68 year old with a wound involving the skin of the RIGHT lower abdominal wall overlying the low pelvis, presenting now with bleeding and a foul over emanating from the wound. Surgical history includes hysterectomy, cholecystectomy and remote pancreatic cyst drainage. EXAM: CT ABDOMEN AND PELVIS WITH CONTRAST TECHNIQUE: Multidetector CT imaging of the abdomen and pelvis was  performed using the standard protocol following bolus administration of intravenous contrast. CONTRAST:  17m OMNIPAQUE IOHEXOL 300 MG/ML IV. COMPARISON:  01/10/2019 and earlier. FINDINGS: Lower chest: Minimal scar and bronchiectasis involving the lower lobes. Visualized lung bases otherwise clear. Heart moderately enlarged with dense mitral annular calcification and mild RIGHT coronary and LEFT circumflex coronary artery calcification. Hepatobiliary: Liver normal in size and appearance. Surgically absent gallbladder. No unexpected biliary ductal dilation. Pancreas: Severely atrophic. No pancreatic mass or peripancreatic inflammation. Spleen: Calcified granuloma in the LOWER pole. No significant abnormalities. Adrenals/Urinary Tract: Normal appearing adrenal glands. Scarring involving both kidneys. No masses involving either kidney. No hydronephrosis. No urinary tract calculi. Gas in the bladder lumen and gas involving the lateral walls of the urinary bladder; the interstitial gas is a new finding since the prior CT. Stomach/Bowel: Stomach normal in appearance for the degree of distention. Normal-appearing small bowel. Moderate colonic stool burden. Descending and sigmoid colon diverticulosis without evidence of acute diverticulitis. Appendix not conspicuous, but no pericecal inflammation. Vascular/Lymphatic: Moderate to severe aortoiliofemoral atherosclerosis without evidence of aneurysm. No pathologic lymphadenopathy. Reproductive: RIGHT ovarian cysts which are stable dating back to 09/03/2018, the largest measuring approximately 3.6 x 4.5 cm. Surgically absent uterus. No LEFT adnexal masses. Other: Gas-filled wound in the subcutaneous fat of the RIGHT LOWER abdominal wall overlying the low pelvis. No evidence of underlying abscess. Prominent subcutaneous vein immediately adjacent to the wound which likely accounts for the bleeding. Mild diffuse body wall edema as noted previously. Sarcopenia as noted previously.  Musculoskeletal: Osseous demineralization. Severe degenerative disc disease at L4-5 and  L5-S1 with a central disc protrusion or extrusion at L4-5. No acute findings. IMPRESSION: 1. Gas-filled wound in the subcutaneous fat of the RIGHT lower abdominal wall overlying the low pelvis. No evidence of underlying abscess. A prominent subcutaneous vein adjacent to the wound likely accounts for the recent bleeding. 2. Gas in the bladder lumen and gas involving the lateral walls of the urinary bladder, query recent instrumentation. The interstitial gas is a new finding since the 01/10/2019 CT and raises the question of cystitis. 3. Stable RIGHT ovarian cysts dating back to 09/03/2018, the largest measuring up to 4.5 cm. 4. Descending and sigmoid colon diverticulosis without evidence of acute diverticulitis. 5. Severe pancreatic atrophy. Aortic Atherosclerosis (ICD10-I70.0). Electronically Signed   By: Evangeline Dakin M.D.   On: 03/07/2019 20:32    Procedures Procedures (including critical care time)  Medications Ordered in ED Medications  vancomycin (VANCOCIN) IVPB 1000 mg/200 mL premix (1,000 mg Intravenous New Bag/Given 03/07/19 2110)  vancomycin (VANCOCIN) IVPB 1000 mg/200 mL premix (has no administration in time range)  sodium chloride 0.9 % bolus 500 mL (0 mLs Intravenous Stopped 03/07/19 2051)  iohexol (OMNIPAQUE) 300 MG/ML solution 100 mL (100 mLs Intravenous Contrast Given 03/07/19 1955)     Initial Impression / Assessment and Plan / ED Course  I have reviewed the triage vital signs and the nursing notes.  Pertinent labs & imaging results that were available during my care of the patient were reviewed by me and considered in my medical decision making (see chart for details).  Clinical Course as of Mar 07 2139  Mon Mar 06, 2662  5029 68 year old female with multiple medical problems presents with complaint of worsening wound to her lower abdominal area.  Patient has been seen by Dr. Constance Haw with  general surgery with plan for biopsy of the area later this week.  Review of Dr. 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  At this point patient has a large open wound to her abdomen approximately 4 cm x 3 cm with thick purulent drainage (gray to yellow in color) with foul odor with surrounding erythema.  Patient reports occasionally having air come from the wound.  Patient denies fevers, denies abdominal pain, nausea, vomiting or other complaints or concerns.  Patient has a home health nurse who has been taking care of these wounds, it is unclear the exact timeline for when they to be in the state they are today however appear different from patient's previous office visit 4 days ago. Review of lab work, patient is COVID negative, BMP without significant findings, CBC with hemoglobin of 6.8, history of anemia, previous hemoglobin of 8.2, patient was seen in the ER 4 days ago for bleeding from the area and questions if she lost "a lot of blood" from the bleeding that day.   [LM]  2117 CT scan shows a gas-filled wound in the subcutaneous fat of the right lower abdominal wall overlying the low pelvis, no evidence of underlying abscess.  CT discussed with Dr. Arnoldo Morale, on-call with general surgery, plan is to admit to hospital service with surgery to round on patient in the morning.   [LM]  2118 Patient reports history of MRSA and various antibiotic resistance, given vancomycin with pharmacy to dose while in the emergency room.  Wound cultures were obtained.   [LM]  2140 Case discussed with hopsitalist who will consult for admission.   Review of UA, known chronic UTIs, no urinary symptoms  today.   [LM]    Clinical Course User Index [LM] Tacy Learn, PA-C      Final Clinical Impressions(s) / ED Diagnoses   Final diagnoses:  Wound infection    ED Discharge Orders    None       Roque Lias 03/07/19 2141    Nat Christen, MD 03/08/19 1620

## 2019-03-07 NOTE — H&P (Signed)
History and Physical    [name] ZOX:[id number] DOB: 1951-08-15 DOA: 03/07/2019  PCP: [name], MD   Patient coming from: Home  I have personally briefly reviewed patient's old medical records in [place]  Chief Complaint: Abdominal wound with foul-smelling drainage  HPI: [name] is a 68 y.o. female with medical history significant for diabetes mellitus, hypertension, atrial fibrillation, MRSA infection, diastolic CHF, CKD 3, COPD, depression, who presented to the ED with complaints of chronic abdominal wound with thick foul-smelling drainage.  Patient's wounds have been present since Thanksgiving.  She had 3 wounds, 1 of them has progressed significantly in size. She has mild to moderate intermittent pain from her wounds. She noticed foul-smelling drainage after recent hospitalization.  She denies fever or chills.  She denies any trauma to her abdomen that started this wounds.  She denies pain with urination, but reports frequency from diuretics.  She denies difficulty breathing or cough, no chest pain, she has mild chronic lower extremity swelling that is unchanged.  She has been compliant with her medications including Lasix and Eliquis.  Her last dose of Eliquis was midday yesterday.  Patient said she did not take any subsequent doses as she felt to be seeking evaluation for her wounds and may need a procedure.  Recent Hospitalization 7/2 to 7/4-managed for acute pulmonary edema secondary to decompensated diastolic CHF, with atrial fibrillation with RVR.  Patient was diuresed and started on nitroglycerin infusion, thought secondary to Feraheme infusion.  Patient was to follow-up with Dr. Constance Haw as outpatient for biopsy of abdominal wounds. 5/25 to 5/31 also for decompensated colic CHF.  ED Course: Temperature 98.1.  Heart rate 59-61, blood pressure 130s to 160s.  WBC 10.5.  With low hemoglobin 6.8.  Abdominal and pelvic CT with contrast-gas-filled wound in  the subcutaneous fat of the right lower abdominal wall overlying the low pelvis no evidence of underlying abscess (please see detailed report).  Patient was started on IV vancomycin. EDP talked to general surgery on-call Dr. Arnoldo Morale, who will see in a.m.  Review of Systems: As per HPI all other systems reviewed and negative.  Past Medical History:  Diagnosis Date   Atrial fibrillation (HCC)    CHF (congestive heart failure) ([place])    Depression    Diabetes mellitus without complication ([place])    History of transesophageal echocardiography (TEE) 03/2018   LV thrombus   Hypertension    Morbid obesity ([place])    Osteoporosis    Urinary incontinence    UTI (lower urinary tract infection) 01/2016   Cipro for Klebsiella pneumoniae isolate   Wheelchair bound    bedbound    Past Surgical History:  Procedure Laterality Date   ABDOMINAL HYSTERECTOMY     BIOPSY  09/06/2018   Procedure: BIOPSY;  Surgeon: Danie Binder, MD;  [facility];  Service: Endoscopy;;  gastric bx's   CESAREAN SECTION     CHOLECYSTECTOMY     ESOPHAGEAL DILATION  09/06/2018   Procedure: ESOPHAGEAL DILATION;  Surgeon: Danie Binder, MD;  [facility];  Service: Endoscopy;;   ESOPHAGOGASTRODUODENOSCOPY (EGD) WITH PROPOFOL N/A 09/06/2018   Procedure: ESOPHAGOGASTRODUODENOSCOPY (EGD) WITH PROPOFOL;  Surgeon: Danie Binder, MD;  [facility];  Service: Endoscopy;  Laterality: N/A;  dilatation   FEMUR IM NAIL Left 02/20/2016   FEMUR IM NAIL Left 02/20/2016   Procedure: INTRAMEDULLARY (IM) RETROGRADE FEMORAL NAILING;  Surgeon: Leandrew Koyanagi, MD;  Location: [place];  Service:  Orthopedics;  Laterality: Left;   PANCREAS SURGERY  1967   1 cyst excised and one cyst drained   TEE WITHOUT CARDIOVERSION N/A 04/05/2018   Procedure: TRANSESOPHAGEAL ECHOCARDIOGRAM (TEE);  Surgeon: Dorothy Spark, MD;  Location: [facility] ENDOSCOPY;  Service: Cardiovascular;  Laterality: N/A;   VEIN LIGATION  AND STRIPPING     WRIST FRACTURE SURGERY       reports that she has never smoked. She has never used smokeless tobacco. She reports that she does not drink alcohol or use drugs.  Allergies  Allergen Reactions   Feraheme [Ferumoxytol] Other (See Comments)    Back pain (yelling out with back pain)   Propranolol Swelling    Pt states it may have been leg swelling   Topamax [Topiramate] Other (See Comments)    hallucinations   Latex Itching and Rash    Family History  Problem Relation Age of Onset   Diabetes Mother        died in her 39's of a stroke   Cancer Mother    Stroke Mother    Breast cancer Mother    Diabetes Father        died in his 78's of a stroke.   Stroke Father    Diabetes Brother        died @ 19 of a stroke.   Stroke Brother    Diabetes Maternal Grandmother    Diabetes Maternal Grandfather    Diabetes Paternal Grandmother    Diabetes Paternal Grandfather    Breast cancer Maternal Aunt     Prior to Admission medications   Medication Sig Start Date End Date Taking? Authorizing Provider  acetaminophen (TYLENOL) 500 MG tablet Take 500-1,000 mg by mouth every 8 (eight) hours as needed for mild pain or headache.    Yes [provider]  albuterol (PROVENTIL HFA;VENTOLIN HFA) 108 (90 Base) MCG/ACT inhaler Inhale 2 puffs into the lungs every 6 (six) hours as needed for wheezing or shortness of breath. Patient taking differently: Inhale 2 puffs into the lungs every 6 (six) hours as needed for wheezing or shortness of breath (As needed).  08/14/18  Yes Kathie Dike, MD  apixaban (ELIQUIS) 5 MG TABS tablet Take 1 tablet (5 mg total) by mouth 2 (two) times daily. 07/21/18  Yes Herminio Commons, MD  buPROPion (WELLBUTRIN XL) 300 MG 24 hr tablet Take 300 mg by mouth daily.  01/31/19  Yes [provider]  diltiazem (CARDIZEM CD) 120 MG 24 hr capsule Take 1 capsule (120 mg total) by mouth daily. Patient taking differently: Take 120 mg  by mouth daily. Cartia XT 09/10/18  Yes Johnson, Clanford L, MD  escitalopram (LEXAPRO) 10 MG tablet Take 10 mg by mouth daily. 12/28/18  Yes [provider]  ferrous sulfate (FERROUSUL) 325 (65 FE) MG tablet Take 1 tablet (325 mg total) by mouth 2 (two) times daily with a meal. 01/16/19  Yes Domenic Polite, MD  fluticasone [facility]) 50 MCG/ACT nasal spray Place 2 sprays into both nostrils daily as needed for allergies or rhinitis (congestion).    Yes [provider]  furosemide (LASIX) 20 MG tablet Take 3 tablets (60 mg total) by mouth 2 (two) times daily. 02/19/19 03/21/19 Yes Shah, Pratik D, DO  glipiZIDE (GLUCOTROL) 10 MG tablet Take 10 mg by mouth 2 (two) times daily. 03/02/18  Yes [provider]  levalbuterol Penne Lash) 0.63 MG/3ML nebulizer solution Take 0.63 mg by nebulization every 6 (six) hours as needed for wheezing or shortness  of breath.  02/08/19  Yes [provider]  LEVEMIR FLEXTOUCH 100 UNIT/ML Pen Inject 10 Units into the skin 2 (two) times a day. 01/05/19  Yes [provider]  metoprolol tartrate (LOPRESSOR) 50 MG tablet Take 50 mg by mouth 2 (two) times daily.  02/12/19  Yes [provider]  nystatin (MYCOSTATIN/NYSTOP) powder Apply topically daily as needed (rash in skin folds).   Yes [provider]  OVER THE COUNTER MEDICATION Apply 1 application topically daily as needed (rash in skin folds). Baby butt paste   Yes [provider]  pantoprazole (PROTONIX) 40 MG tablet Take 1 tablet (40 mg total) by mouth daily before breakfast. Patient taking differently: Take 40 mg by mouth daily as needed (for GERD/acid reflux).  09/11/18  Yes Murlean Iba, MD    Physical Exam: Vitals:   03/07/19 1800 03/07/19 2015 03/07/19 2030 03/07/19 2045  BP: (!) 135/49  (!) 161/52   Pulse:  61 61 (!) 59  Resp:      Temp:      TempSrc:      SpO2:  100% 99% 100%  Weight:      Height:        Constitutional: NAD, calm,  comfortable Vitals:   03/07/19 1800 03/07/19 2015 03/07/19 2030 03/07/19 2045  BP: (!) 135/49  (!) 161/52   Pulse:  61 61 (!) 59  Resp:      Temp:      TempSrc:      SpO2:  100% 99% 100%  Weight:      Height:       Eyes: PERRL, lids and conjunctivae normal ENMT: Mucous membranes are moist. Posterior pharynx clear of any exudate or lesions  Neck: normal, supple, no masses, no thyromegaly Respiratory: Lying almost flat comfortably, clear to auscultation bilaterally, no wheezing, no crackles. Normal respiratory effort. No accessory muscle use.  Cardiovascular: No murmurs / rubs / gallops. No extremity edema. 2+ pedal pulses.  Abdomen: no tenderness, mild surrounding erythema around wound with induration,, without significant tenderness, 3 wounds present.  Largest wound about ~4cm, at least 3 cm deep, with greenish purulence fluid at the base, wound is located on lower abdominal pannus.  No hepatosplenomegaly. Bowel sounds positive.  Musculoskeletal: no clubbing / cyanosis. No joint deformity upper and lower extremities. Good ROM, no contractures. Normal muscle tone.  Skin: no rashes, lesions, ulcers. No induration Neurologic: CN 2-12 grossly intact. Sensation intact, DTR normal. Strength 5/5 in all 4.  Psychiatric: Normal judgment and insight. Alert and oriented x 3. Normal mood.          Labs on Admission: I have personally reviewed following labs and imaging studies  CBC: Recent Labs  Lab 03/03/19 1047 03/07/19 1915  WBC  --  10.5  NEUTROABS  --  7.9*  HGB 8.2* 6.8*  HCT 24.0* 23.8*  MCV  --  96.0  PLT  --  209   Basic Metabolic Panel: Recent Labs  Lab 03/03/19 1047 03/07/19 1915  NA 139 135  K 3.8 3.7  CL 95* 101  CO2  --  24  GLUCOSE 259* 237*  BUN 20 20  CREATININE 0.90 0.81  CALCIUM  --  7.8*   Urine analysis:    Component Value Date/Time   COLORURINE YELLOW 03/07/2019 2104   APPEARANCEUR HAZY (A) 03/07/2019 2104   LABSPEC 1.024 03/07/2019 2104    PHURINE 6.0 03/07/2019 2104   GLUCOSEU NEGATIVE 03/07/2019 2104   [place] NEGATIVE 03/07/2019 2104  [place] NEGATIVE 03/07/2019 2104   [place] NEGATIVE 03/07/2019 2104   PROTEINUR 100 (A) 03/07/2019 2104   UROBILINOGEN 0.2 12/01/2007 1025   NITRITE NEGATIVE 03/07/2019 2104   LEUKOCYTESUR MODERATE (A) 03/07/2019 2104    Radiological Exams on Admission: Ct Abdomen Pelvis W Contrast  Result Date: 03/07/2019 CLINICAL DATA:  68 year old with a wound involving the skin of the RIGHT lower abdominal wall overlying the low pelvis, presenting now with bleeding and a foul over emanating from the wound. Surgical history includes hysterectomy, cholecystectomy and remote pancreatic cyst drainage. EXAM: CT ABDOMEN AND PELVIS WITH CONTRAST TECHNIQUE: Multidetector CT imaging of the abdomen and pelvis was performed using the standard protocol following bolus administration of intravenous contrast. CONTRAST:  182m OMNIPAQUE IOHEXOL 300 MG/ML IV. COMPARISON:  01/10/2019 and earlier. FINDINGS: Lower chest: Minimal scar and bronchiectasis involving the lower lobes. Visualized lung bases otherwise clear. Heart moderately enlarged with dense mitral annular calcification and mild RIGHT coronary and LEFT circumflex coronary artery calcification. Hepatobiliary: Liver normal in size and appearance. Surgically absent gallbladder. No unexpected biliary ductal dilation. Pancreas: Severely atrophic. No pancreatic mass or peripancreatic inflammation. Spleen: Calcified granuloma in the LOWER pole. No significant abnormalities. Adrenals/Urinary Tract: Normal appearing adrenal glands. Scarring involving both kidneys. No masses involving either kidney. No hydronephrosis. No urinary tract calculi. Gas in the bladder lumen and gas involving the lateral walls of the urinary bladder; the interstitial gas is a new finding since the prior CT. Stomach/Bowel: Stomach normal in appearance for the degree of distention. Normal-appearing small  bowel. Moderate colonic stool burden. Descending and sigmoid colon diverticulosis without evidence of acute diverticulitis. Appendix not conspicuous, but no pericecal inflammation. Vascular/Lymphatic: Moderate to severe aortoiliofemoral atherosclerosis without evidence of aneurysm. No pathologic lymphadenopathy. Reproductive: RIGHT ovarian cysts which are stable dating back to 09/03/2018, the largest measuring approximately 3.6 x 4.5 cm. Surgically absent uterus. No LEFT adnexal masses. Other: Gas-filled wound in the subcutaneous fat of the RIGHT LOWER abdominal wall overlying the low pelvis. No evidence of underlying abscess. Prominent subcutaneous vein immediately adjacent to the wound which likely accounts for the bleeding. Mild diffuse body wall edema as noted previously. Sarcopenia as noted previously. Musculoskeletal: Osseous demineralization. Severe degenerative disc disease at L4-5 and L5-S1 with a central disc protrusion or extrusion at L4-5. No acute findings. IMPRESSION: 1. Gas-filled wound in the subcutaneous fat of the RIGHT lower abdominal wall overlying the low pelvis. No evidence of underlying abscess. A prominent subcutaneous vein adjacent to the wound likely accounts for the recent bleeding. 2. Gas in the bladder lumen and gas involving the lateral walls of the urinary bladder, query recent instrumentation. The interstitial gas is a new finding since the 01/10/2019 CT and raises the question of cystitis. 3. Stable RIGHT ovarian cysts dating back to 09/03/2018, the largest measuring up to 4.5 cm. 4. Descending and sigmoid colon diverticulosis without evidence of acute diverticulitis. 5. Severe pancreatic atrophy. Aortic Atherosclerosis (ICD10-I70.0). Electronically Signed   By: TEvangeline DakinM.D.   On: 03/07/2019 20:32    EKG: Independently reviewed. None.  Assessment/Plan Active Problems:   Abdominal wall skin ulcer (HCC)    Abdominal wall skin ulcer-infected abdominal wounds, with  mild surrounding erythema and induration, with purulent drainage.  History of MRSA infection.  WBC 10.5 patient rules out for sepsis.  Abdominal CT-gas-filled wound in the subcutaneous fat of the right lower abdominal wall overlying the low pelvis.  No evidence of underlying abscess. -N.p.o. midnight -F/u General surgery recommendations in a.m. -Continue IV  vancomycin, add IV Zosyn pharmacy to dose -BMP a.m. -Follow-up blood and superficial wound cultures.  Acute on chronic anemia-hemoglobin 6.8, at baseline ~8.  With Dr. Delton Coombes for normocytic anemia, secondary to iron deficiency.  Gets intermittent Feraheme infusion.  Iron panel 01/12/2019 shows ferritin of 33, percentage saturation of 3. -Transfuse 1 unit PRBC -Continue home iron tablets - IV lasix 35m x 1 with transfusion - CBC a.m  Atrial fibrillation-rate controlled and anticoagulated. -Continue home diltiazem, metoprolol -Hold home Eliquis.  Diastolic CHF- appears euvolemic.  Dose of Lasix was recently increased to 60 mg twice daily, patient tells me she is still taking 40 mg twice daily for now because of urinary frequency.  Last echo 12/2018, EF 50 to 55%, left ventricular diastolic Doppler parameters are consistent with pseudonormalization. -Continue home Lasix [id number] in a.m.  -Cont Home metoprolol  Diabetes mellitus-random glucose 273. 11/2018- HgbA1c-7.7 -Hold home glipizide, continue home Levemir at reduced dose 8 units 3 times daily - SSI- M  Hypertension-140s to 160s -Continue home diltiazem, metoprolol and Lasix  COPD-stable -PRN duo nebs  Depression, wheelchair-bound -Continue home Lexapro bupropion.  DVT prophylaxis:SCDS Code Status: Full Family Communication: None at bedside Disposition Plan: Per rounding team Consults called: Gen surg Admission status: Inpt, tele  EjBethena RoysD [facility]  03/07/2019, 10:28 PM

## 2019-03-07 NOTE — ED Notes (Signed)
Lab has not drawn for type and screen but is aware pt is being transferred upstairs to room 303

## 2019-03-08 ENCOUNTER — Inpatient Hospital Stay: Payer: Self-pay

## 2019-03-08 ENCOUNTER — Encounter (HOSPITAL_COMMUNITY): Payer: Self-pay

## 2019-03-08 LAB — GLUCOSE, CAPILLARY
Glucose-Capillary: 113 mg/dL — ABNORMAL HIGH (ref 70–99)
Glucose-Capillary: 23 mg/dL — CL (ref 70–99)
Glucose-Capillary: 61 mg/dL — ABNORMAL LOW (ref 70–99)
Glucose-Capillary: 95 mg/dL (ref 70–99)
Glucose-Capillary: 124 mg/dL — ABNORMAL HIGH (ref 70–99)
Glucose-Capillary: 50 mg/dL — ABNORMAL LOW (ref 70–99)
Glucose-Capillary: 59 mg/dL — ABNORMAL LOW (ref 70–99)
Glucose-Capillary: 134 mg/dL — ABNORMAL HIGH (ref 70–99)
Glucose-Capillary: 84 mg/dL (ref 70–99)

## 2019-03-08 LAB — CBC
HCT: 27 % — ABNORMAL LOW (ref 36.0–46.0)
Hemoglobin: 7.9 g/dL — ABNORMAL LOW (ref 12.0–15.0)
MCH: 27.5 pg (ref 26.0–34.0)
MCHC: 29.3 g/dL — ABNORMAL LOW (ref 30.0–36.0)
MCV: 94.1 fL (ref 80.0–100.0)
Platelets: 338 10*3/uL (ref 150–400)
RBC: 2.87 MIL/uL — ABNORMAL LOW (ref 3.87–5.11)
RDW: 18.9 % — ABNORMAL HIGH (ref 11.5–15.5)
WBC: 9.5 10*3/uL (ref 4.0–10.5)
nRBC: 0 % (ref 0.0–0.2)

## 2019-03-08 LAB — BASIC METABOLIC PANEL
Anion gap: 9 (ref 5–15)
BUN: 17 mg/dL (ref 8–23)
CO2: 27 mmol/L (ref 22–32)
Calcium: 8 mg/dL — ABNORMAL LOW (ref 8.9–10.3)
Chloride: 102 mmol/L (ref 98–111)
Creatinine, Ser: 0.8 mg/dL (ref 0.44–1.00)
GFR calc Af Amer: >60 mL/min (ref >60)
GFR calc non Af Amer: >60 mL/min (ref >60)
Glucose, Bld: 58 mg/dL — ABNORMAL LOW (ref 70–99)
Potassium: 3.2 mmol/L — ABNORMAL LOW (ref 3.5–5.1)
Sodium: 138 mmol/L (ref 135–145)

## 2019-03-08 LAB — PREPARE RBC (CROSSMATCH)

## 2019-03-08 MED ORDER — DEXTROSE 50 % IV SOLN
INTRAVENOUS | Status: AC
  Administered 2019-03-08: 50 mL
  Filled 2019-03-08: qty 50

## 2019-03-08 MED ORDER — SODIUM CHLORIDE 0.9% FLUSH: 10.0000 mL | INTRAVENOUS | Status: DC | PRN

## 2019-03-08 MED ORDER — SODIUM CHLORIDE 0.9% FLUSH
10.0000 mL | Freq: Two times a day (BID) | INTRAVENOUS | Status: DC
  Administered 2019-03-08 – 2019-03-10 (×4): 10 mL

## 2019-03-08 MED ORDER — CHLORHEXIDINE GLUCONATE CLOTH 2 % EX PADS
6.0000 | MEDICATED_PAD | Freq: Once | CUTANEOUS | Status: AC
  Administered 2019-03-08: 6 via TOPICAL

## 2019-03-08 MED ORDER — DEXTROSE 50 % IV SOLN
1.0000 | Freq: Once | INTRAVENOUS | Status: AC
  Administered 2019-03-08: 50 mL via INTRAVENOUS

## 2019-03-08 MED ORDER — POTASSIUM CHLORIDE CRYS ER 20 MEQ PO TBCR
40.0000 meq | EXTENDED_RELEASE_TABLET | Freq: Once | ORAL | Status: AC
  Administered 2019-03-08: 40 meq via ORAL
  Filled 2019-03-08: qty 2

## 2019-03-08 MED ORDER — CHLORHEXIDINE GLUCONATE CLOTH 2 % EX PADS
6.0000 | MEDICATED_PAD | Freq: Once | CUTANEOUS | Status: AC
  Administered 2019-03-09: 6 via TOPICAL

## 2019-03-08 MED ORDER — DEXTROSE-NACL 5-0.45 % IV SOLN
INTRAVENOUS | Status: DC
  Administered 2019-03-08: 07:00:00 via INTRAVENOUS

## 2019-03-08 MED ORDER — DEXTROSE 50 % IV SOLN
INTRAVENOUS | Status: AC
  Filled 2019-03-08: qty 50

## 2019-03-08 MED ORDER — DEXTROSE 50 % IV SOLN: 1.0000 | Freq: Once | INTRAVENOUS | Status: DC

## 2019-03-08 NOTE — Progress Notes (Signed)
Rechecked patients CBG at 0504 reading was 61. Paged MD. MD gave verbal order to give another amp of D50. Gave one amp of D50 at 0518. Will recheck CBG. Will continue to monitor patient throughout shift.

## 2019-03-08 NOTE — Progress Notes (Signed)
Xcover bs 95 after D50 Levemir discontinued since NPO Start d5 1/2 ns at 50 mL per hour x 8 hours Cont fsbs and ISS

## 2019-03-08 NOTE — Progress Notes (Signed)
Md placed order for D5 1/2 NS at 83m/hr for CBG result of 95. Will continue to monitor throughout shift.

## 2019-03-08 NOTE — Progress Notes (Signed)
Rechecked patients CBG level at 0504. Reading was 61. Paged MD to make aware. No new orders at this time. Will continue to monitor throughout shift.

## 2019-03-08 NOTE — Progress Notes (Signed)
Peripherally Inserted Central Catheter/Midline Placement  The IV Nurse has discussed with the patient and/or persons authorized to consent for the patient, the purpose of this procedure and the potential benefits and risks involved with this procedure.  The benefits include less needle sticks, lab draws from the catheter, and the patient may be discharged home with the catheter. Risks include, but not limited to, infection, bleeding, blood clot (thrombus formation), and puncture of an artery; nerve damage and irregular heartbeat and possibility to perform a PICC exchange if needed/ordered by physician.  Alternatives to this procedure were also discussed.  Bard Power PICC patient education guide, fact sheet on infection prevention and patient information card has been provided to patient /or left at bedside.    PICC/Midline Placement Documentation  PICC Single Lumen 78/41/28 PICC Right Basilic 38 cm 0 cm (Active)  Indication for Insertion or Continuance of Line Poor Vasculature-patient has had multiple peripheral attempts or PIVs lasting less than 24 hours 03/08/19 1346  Exposed Catheter (cm) 0 cm 03/08/19 1346  Site Assessment Clean;Dry;Intact 03/08/19 1346  Line Status Flushed;Blood return noted 03/08/19 1346  Dressing Type Transparent 03/08/19 1346  Dressing Status Clean;Dry;Intact;Antimicrobial disc in place 03/08/19 1346  Dressing Intervention New dressing 03/08/19 1346  Dressing Change Due 03/15/19 03/08/19 1346       Christella Noa Albarece 03/08/2019, 1:46 PM

## 2019-03-08 NOTE — Progress Notes (Signed)
[facility]  Patient with abdominal wounds that are draining and look necrotic. The wounds have been getting worse and larger. She continues to have drainage from this area and the CT has areas of gas and fatty tissue that has stranding. At this point we do not have a diagnosis of pyoderma gangrenosum and although less is more in this situation, I do not want to leave the patient with necrotic infected tissue on her pannus.    I think the best option is for wide debridement and wound vacuum placement. We will send the tissue to pathology and for culture.  If she does have pyoderma she will need systemic steroids, and knows the risk of worsening wounds after the surgical debridement.   I have discussed with Dr. Manuella Ghazi. Discussed with the patient who is in agreement. Consent orders placed. OR tomorrow. NPO midnight. Hold Eliquis.  Curlene Labrum, MD [facility] [address] [place], [place] [address] [phone] (office)

## 2019-03-08 NOTE — Progress Notes (Signed)
Hypoglycemic Event  CBG: 23  Treatment: D50 50 mL (25 gm)  Symptoms: Pale and Sweaty  Follow-up CBG: Time: 0425 CBG Result:113  Possible Reasons for Event: Inadequate meal intake  Comments/MD notified: Dr. Maudie Mercury notified no new orders     Zenaida Deed

## 2019-03-08 NOTE — Progress Notes (Signed)
PROGRESS NOTE    [name]  MVH:[id number] DOB: 1951/08/12 DOA: 03/07/2019 PCP: [name], MD   Brief Narrative:  Per HPI: [name] is a 68 y.o. female with medical history significant for diabetes mellitus, hypertension, atrial fibrillation, MRSA infection, diastolic CHF, CKD 3, COPD, depression, who presented to the ED with complaints of chronic abdominal wound with thick foul-smelling drainage.  Patient's wounds have been present since Thanksgiving.  She had 3 wounds, 1 of them has progressed significantly in size. She has mild to moderate intermittent pain from her wounds. She noticed foul-smelling drainage after recent hospitalization.  She denies fever or chills.  She denies any trauma to her abdomen that started this wounds.  She denies pain with urination, but reports frequency from diuretics.  She denies difficulty breathing or cough, no chest pain, she has mild chronic lower extremity swelling that is unchanged.  She has been compliant with her medications including Lasix and Eliquis.  Her last dose of Eliquis was midday yesterday.  Patient said she did not take any subsequent doses as she felt to be seeking evaluation for her wounds and may need a procedure.  She was admitted with an abdominal wall ulceration with drainage and is planned to undergo debridement and biopsy of this area in the morning with general surgery.  She has been started on IV vancomycin and Zosyn empirically with cultures pending.  Assessment & Plan:   Active Problems:   Abdominal wall skin ulcer (HCC)   Abdominal wall skin ulcer-infected abdominal wounds, with mild surrounding erythema and induration, with purulent drainage.  History of MRSA infection.  WBC 10.5 patient rules out for sepsis.  Abdominal CT-gas-filled wound in the subcutaneous fat of the right lower abdominal wall overlying the low pelvis.  No evidence of underlying abscess. -Diet for today and then n.p.o. after  midnight -General surgery for debridement in a.m. and discussed with Dr. Constance Haw -Continue IV vancomycin and IV Zosyn empirically -BMP a.m. -Blood cultures with no growth thus far, but multiple organisms on Gram stain noted  Acute on chronic anemia-hemoglobin 6.8, at baseline ~8.  With Dr. Delton Coombes for normocytic anemia, secondary to iron deficiency.  Gets intermittent Feraheme infusion.  Iron panel 01/12/2019 shows ferritin of 33, percentage saturation of 3. -Improved to 7.9 after 1 unit PRBC transfusion -Continue home iron tablets - IV lasix 1m x 1 with transfusion given - CBC a.m  Atrial fibrillation-rate controlled and off anticoagulation currently -Continue home diltiazem, metoprolol -Hold home Eliquis.  Diastolic CHF- appears euvolemic.  Dose of Lasix was recently increased to 60 mg twice daily, patient tells me she is still taking 40 mg twice daily for now because of urinary frequency.  Last echo 12/2018, EF 50 to 55%, left ventricular diastolic Doppler parameters are consistent with pseudonormalization. -Continue home Lasix [id number] in a.m.  -Cont Home metoprolol  Diabetes mellitus-random glucose 273. 11/2018- HgbA1c-7.7 -Hold home glipizide and home Levemir for now due to some hypoglycemia noted overnight - SSI- M -Continue to diet for now with n.p.o. after midnight -Restart D5 IV fluid as needed  Hypertension-140s to 160s -Continue home diltiazem, metoprolol and Lasix  COPD-stable -PRN duo nebs  Depression, wheelchair-bound -Continue home Lexapro bupropion.   DVT prophylaxis:SCDs Code Status: Full Family Communication: None at bedside Disposition Plan: Maintain on IV antibiotics with general surgery for debridement in a.m.  Remain off Eliquis with n.p.o. after midnight.   Consultants:   General surgery  Procedures:   PICC line placement 7/21  due to poor venous access  Antimicrobials:  Anti-infectives (From admission, onward)   Start     Dose/Rate  Route Frequency Ordered Stop   03/08/19 0900  vancomycin (VANCOCIN) IVPB 1000 mg/200 mL premix     1,000 mg 200 mL/hr over 60 Minutes Intravenous Every 12 hours 03/07/19 1922     03/08/19 0600  piperacillin-tazobactam (ZOSYN) IVPB 3.375 g     3.375 g 12.5 mL/hr over 240 Minutes Intravenous Every 8 hours 03/07/19 2257     03/07/19 2230  piperacillin-tazobactam (ZOSYN) IVPB 3.375 g     3.375 g 100 mL/hr over 30 Minutes Intravenous  Once 03/07/19 2216 03/07/19 2256   03/07/19 2000  vancomycin (VANCOCIN) IVPB 1000 mg/200 mL premix     1,000 mg 200 mL/hr over 60 Minutes Intravenous Every 1 hr x 2 03/07/19 1903 03/07/19 2230       Subjective: Patient seen and evaluated today with no new acute complaints or concerns. No acute concerns or events noted overnight.  She continues to have foul-smelling discharge from her abdominal area with no other pain noted.  She was noted to be hypoglycemic overnight and started on D5.  She appears to be doing well with her current diet.  Objective: Vitals:   03/08/19 0157 03/08/19 0357 03/08/19 0517 03/08/19 1344  BP: (!) 144/49 (!) 164/48 134/69 (!) 146/46  Pulse: (!) 59 61 (!) 56 (!) 56  Resp: 19 20 16 16   Temp: 98.2 F (36.8 C) 97.9 F (36.6 C) 98.3 F (36.8 C)   TempSrc: Oral Oral Oral   SpO2: 100% 97% 99% 99%  Weight:      Height:        Intake/Output Summary (Last 24 hours) at 03/08/2019 1504 Last data filed at 03/08/2019 1416 Gross per 24 hour  Intake 975.79 ml  Output 2400 ml  Net -1424.21 ml   Filed Weights   03/07/19 1535 03/07/19 2343  Weight: 93.9 kg 96 kg    Examination:  General exam: Appears calm and comfortable, obese Respiratory system: Clear to auscultation. Respiratory effort normal. Cardiovascular system: S1 & S2 heard, RRR. No JVD, murmurs, rubs, gallops or clicks. No pedal edema. Gastrointestinal system: Abdomen is nondistended, soft and nontender. No organomegaly or masses felt. Normal bowel sounds heard. Central  nervous system: Alert and oriented. No focal neurological deficits. Extremities: Symmetric 5 x 5 power. Skin: No rashes, lesions or ulcers, abdomen with draining wounds with some necrosis and foul odor Psychiatry: Judgement and insight appear normal. Mood & affect appropriate.     Data Reviewed: I have personally reviewed following labs and imaging studies  CBC: Recent Labs  Lab 03/03/19 1047 03/07/19 1915 03/08/19 0509  WBC  --  10.5 9.5  NEUTROABS  --  7.9*  --   HGB 8.2* 6.8* 7.9*  HCT 24.0* 23.8* 27.0*  MCV  --  96.0 94.1  PLT  --  307 811   Basic Metabolic Panel: Recent Labs  Lab 03/03/19 1047 03/07/19 1915 03/08/19 0509  NA 139 135 138  K 3.8 3.7 3.2*  CL 95* 101 102  CO2  --  24 27  GLUCOSE 259* 237* 58*  BUN 20 20 17   CREATININE 0.90 0.81 0.80  CALCIUM  --  7.8* 8.0*   GFR: Estimated Creatinine Clearance: 75.7 mL/min (by C-G formula based on SCr of 0.8 mg/dL). Liver Function Tests: No results for input(s): AST, ALT, ALKPHOS, BILITOT, PROT, ALBUMIN in the last 168 hours. No results for input(s): LIPASE, AMYLASE  in the last 168 hours. No results for input(s): AMMONIA in the last 168 hours. Coagulation Profile: No results for input(s): INR, PROTIME in the last 168 hours. Cardiac Enzymes: No results for input(s): CKTOTAL, CKMB, CKMBINDEX, TROPONINI in the last 168 hours. BNP (last 3 results) No results for input(s): PROBNP in the last 8760 hours. HbA1C: No results for input(s): HGBA1C in the last 72 hours. CBG: Recent Labs  Lab 03/08/19 0502 03/08/19 0543 03/08/19 0735 03/08/19 0813 03/08/19 1126  GLUCAP 61* 95 50* 124* 59*   Lipid Profile: No results for input(s): CHOL, HDL, LDLCALC, TRIG, CHOLHDL, LDLDIRECT in the last 72 hours. Thyroid Function Tests: No results for input(s): TSH, T4TOTAL, FREET4, T3FREE, THYROIDAB in the last 72 hours. Anemia Panel: No results for input(s): VITAMINB12, FOLATE, FERRITIN, TIBC, IRON, RETICCTPCT in the last 72  hours. Sepsis Labs: No results for input(s): PROCALCITON, LATICACIDVEN in the last 168 hours.  Recent Results (from the past 240 hour(s))  Blood culture (routine x 2)     Status: None (Preliminary result)   Collection Time: 03/07/19  7:03 PM   Specimen: BLOOD  Result Value Ref Range Status   Specimen Description BLOOD RIGHT ANTECUBITAL  Final   Special Requests   Final    BOTTLES DRAWN AEROBIC AND ANAEROBIC Blood Culture results may not be optimal due to an inadequate volume of blood received in culture bottles   Culture   Final    NO GROWTH < 12 HOURS Performed at [facility], [address], [place], [place] [address]    Report Status PENDING  Incomplete  Wound or Superficial Culture     Status: None (Preliminary result)   Collection Time: 03/07/19  7:10 PM   Specimen: Abdomen; Wound  Result Value Ref Range Status   Specimen Description   Final    ABDOMEN Performed at [facility], [address] [place], [place] [address]    Special Requests   Final    Immunocompromised Performed at [facility], [address] [place], [place] [address]    Gram Stain   Final    NO WBC SEEN ABUNDANT GRAM NEGATIVE RODS ABUNDANT GRAM POSITIVE COCCI IN PAIRS IN CLUSTERS RARE GRAM POSITIVE RODS Performed at [facility], [place] [address], [place], [place] [address]    Culture PENDING  Incomplete   Report Status PENDING  Incomplete  Blood culture (routine x 2)     Status: None (Preliminary result)   Collection Time: 03/07/19  7:18 PM   Specimen: BLOOD  Result Value Ref Range Status   Specimen Description BLOOD LEFT HAND  Final   Special Requests AEROBIC BOTTLE ONLY Blood Culture adequate volume  Final   Culture   Final    NO GROWTH < 12 HOURS Performed at [facility], [address], [place], [place] [address]    Report Status PENDING  Incomplete  SARS Coronavirus 2 (CEPHEID - Performed in [place] hospital lab), Hosp Order     Status: None   Collection Time: 03/07/19  7:19 PM    Specimen: Nasopharyngeal Swab  Result Value Ref Range Status   SARS Coronavirus 2 NEGATIVE NEGATIVE Final    Comment: (NOTE) If result is NEGATIVE SARS-CoV-2 target nucleic acids are NOT DETECTED. The SARS-CoV-2 RNA is generally detectable in upper and lower  respiratory specimens during the acute phase of infection. The lowest  concentration of SARS-CoV-2 viral copies this assay can detect is 250  copies / mL. A negative result does not preclude  SARS-CoV-2 infection  and should not be used as the sole basis for treatment or other  patient management decisions.  A negative result may occur with  improper specimen collection / handling, submission of specimen other  than nasopharyngeal swab, presence of viral mutation(s) within the  areas targeted by this assay, and inadequate number of viral copies  (<250 copies / mL). A negative result must be combined with clinical  observations, patient history, and epidemiological information. If result is POSITIVE SARS-CoV-2 target nucleic acids are DETECTED. The SARS-CoV-2 RNA is generally detectable in upper and lower  respiratory specimens dur ing the acute phase of infection.  Positive  results are indicative of active infection with SARS-CoV-2.  Clinical  correlation with patient history and other diagnostic information is  necessary to determine patient infection status.  Positive results do  not rule out bacterial infection or co-infection with other viruses. If result is PRESUMPTIVE POSTIVE SARS-CoV-2 nucleic acids MAY BE PRESENT.   A presumptive positive result was obtained on the submitted specimen  and confirmed on repeat testing.  While 2019 novel coronavirus  (SARS-CoV-2) nucleic acids may be present in the submitted sample  additional confirmatory testing may be necessary for epidemiological  and / or clinical management purposes  to differentiate between  SARS-CoV-2 and other Sarbecovirus currently known to infect humans.  If  clinically indicated additional testing with an alternate test  methodology [phone]) is advised. The SARS-CoV-2 RNA is generally  detectable in upper and lower respiratory sp ecimens during the acute  phase of infection. The expected result is Negative. Fact Sheet for Patients:  StrictlyIdeas.no Fact Sheet for Healthcare Providers: BankingDealers.co.za This test is not yet approved or cleared by the [place] FDA and has been authorized for detection and/or diagnosis of SARS-CoV-2 by FDA under an Emergency Use Authorization (EUA).  This EUA will remain in effect (meaning this test can be used) for the duration of the COVID-19 declaration under Section 564(b)(1) of the Act, 21 U.S.C. section 360bbb-3(b)(1), unless the authorization is terminated or revoked sooner. Performed at [facility], [address], [place], [place] [address]          Radiology Studies: Ct Abdomen Pelvis W Contrast  Result Date: 03/07/2019 CLINICAL DATA:  68 year old with a wound involving the skin of the RIGHT lower abdominal wall overlying the low pelvis, presenting now with bleeding and a foul over emanating from the wound. Surgical history includes hysterectomy, cholecystectomy and remote pancreatic cyst drainage. EXAM: CT ABDOMEN AND PELVIS WITH CONTRAST TECHNIQUE: Multidetector CT imaging of the abdomen and pelvis was performed using the standard protocol following bolus administration of intravenous contrast. CONTRAST:  123m OMNIPAQUE IOHEXOL 300 MG/ML IV. COMPARISON:  01/10/2019 and earlier. FINDINGS: Lower chest: Minimal scar and bronchiectasis involving the lower lobes. Visualized lung bases otherwise clear. Heart moderately enlarged with dense mitral annular calcification and mild RIGHT coronary and LEFT circumflex coronary artery calcification. Hepatobiliary: Liver normal in size and appearance. Surgically absent gallbladder. No unexpected biliary ductal  dilation. Pancreas: Severely atrophic. No pancreatic mass or peripancreatic inflammation. Spleen: Calcified granuloma in the LOWER pole. No significant abnormalities. Adrenals/Urinary Tract: Normal appearing adrenal glands. Scarring involving both kidneys. No masses involving either kidney. No hydronephrosis. No urinary tract calculi. Gas in the bladder lumen and gas involving the lateral walls of the urinary bladder; the interstitial gas is a new finding since the prior CT. Stomach/Bowel: Stomach normal in appearance for the degree of distention. Normal-appearing small bowel. Moderate colonic stool burden. Descending  and sigmoid colon diverticulosis without evidence of acute diverticulitis. Appendix not conspicuous, but no pericecal inflammation. Vascular/Lymphatic: Moderate to severe aortoiliofemoral atherosclerosis without evidence of aneurysm. No pathologic lymphadenopathy. Reproductive: RIGHT ovarian cysts which are stable dating back to 09/03/2018, the largest measuring approximately 3.6 x 4.5 cm. Surgically absent uterus. No LEFT adnexal masses. Other: Gas-filled wound in the subcutaneous fat of the RIGHT LOWER abdominal wall overlying the low pelvis. No evidence of underlying abscess. Prominent subcutaneous vein immediately adjacent to the wound which likely accounts for the bleeding. Mild diffuse body wall edema as noted previously. Sarcopenia as noted previously. Musculoskeletal: Osseous demineralization. Severe degenerative disc disease at L4-5 and L5-S1 with a central disc protrusion or extrusion at L4-5. No acute findings. IMPRESSION: 1. Gas-filled wound in the subcutaneous fat of the RIGHT lower abdominal wall overlying the low pelvis. No evidence of underlying abscess. A prominent subcutaneous vein adjacent to the wound likely accounts for the recent bleeding. 2. Gas in the bladder lumen and gas involving the lateral walls of the urinary bladder, query recent instrumentation. The interstitial gas is a  new finding since the 01/10/2019 CT and raises the question of cystitis. 3. Stable RIGHT ovarian cysts dating back to 09/03/2018, the largest measuring up to 4.5 cm. 4. Descending and sigmoid colon diverticulosis without evidence of acute diverticulitis. 5. Severe pancreatic atrophy. Aortic Atherosclerosis (ICD10-I70.0). Electronically Signed   By: Evangeline Dakin M.D.   On: 03/07/2019 20:32   [place] Ekg Site Rite  Result Date: 03/08/2019 If Site Rite image not attached, placement could not be confirmed due to current cardiac rhythm.       Scheduled Meds:  buPROPion  300 mg Oral Daily   Chlorhexidine Gluconate Cloth  6 each Topical Once   And   Chlorhexidine Gluconate Cloth  6 each Topical Once   diltiazem  120 mg Oral Daily   escitalopram  10 mg Oral Daily   ferrous sulfate  325 mg Oral BID WC   furosemide  40 mg Oral BID   insulin aspart  0-15 Units Subcutaneous Q4H   metoprolol tartrate  50 mg Oral BID   potassium chloride  40 mEq Oral Once   sodium chloride flush  10-40 mL Intracatheter Q12H   Continuous Infusions:  piperacillin-tazobactam (ZOSYN)  IV 3.375 g (03/08/19 1407)   vancomycin       LOS: 1 day    Time spent: 30 minutes    Kristion Holifield Darleen Crocker, DO [facility] Pager [phone]  If 7PM-7AM, please contact night-coverage [email or web address] Password [facility] 03/08/2019, 3:04 PM

## 2019-03-09 ENCOUNTER — Ambulatory Visit: Payer: Self-pay

## 2019-03-09 ENCOUNTER — Other Ambulatory Visit: Payer: Self-pay

## 2019-03-09 ENCOUNTER — Encounter (HOSPITAL_COMMUNITY): Payer: Self-pay | Admitting: Emergency Medicine

## 2019-03-09 ENCOUNTER — Encounter (HOSPITAL_COMMUNITY)
Admission: EM | Disposition: A | Discharge status: Home Health | Payer: Self-pay | Source: Home / Self Care | Attending: Family Medicine

## 2019-03-09 ENCOUNTER — Inpatient Hospital Stay (HOSPITAL_COMMUNITY): Payer: BC Managed Care – PPO | Admitting: Anesthesiology

## 2019-03-09 DIAGNOSIS — L089 Local infection of the skin and subcutaneous tissue, unspecified: Secondary | ICD-10-CM | POA: Diagnosis present

## 2019-03-09 DIAGNOSIS — S31109A Unspecified open wound of abdominal wall, unspecified quadrant without penetration into peritoneal cavity, initial encounter: Secondary | ICD-10-CM

## 2019-03-09 DIAGNOSIS — Z993 Dependence on wheelchair: Secondary | ICD-10-CM

## 2019-03-09 HISTORY — PX: APPLICATION OF WOUND VAC: SHX5189

## 2019-03-09 HISTORY — PX: WOUND DEBRIDEMENT: SHX247

## 2019-03-09 LAB — GLUCOSE, CAPILLARY
Glucose-Capillary: 111 mg/dL — ABNORMAL HIGH (ref 70–99)
Glucose-Capillary: 118 mg/dL — ABNORMAL HIGH (ref 70–99)
Glucose-Capillary: 129 mg/dL — ABNORMAL HIGH (ref 70–99)
Glucose-Capillary: 170 mg/dL — ABNORMAL HIGH (ref 70–99)
Glucose-Capillary: 178 mg/dL — ABNORMAL HIGH (ref 70–99)
Glucose-Capillary: 178 mg/dL — ABNORMAL HIGH (ref 70–99)
Glucose-Capillary: 190 mg/dL — ABNORMAL HIGH (ref 70–99)
Glucose-Capillary: 96 mg/dL (ref 70–99)

## 2019-03-09 LAB — BASIC METABOLIC PANEL
Anion gap: 12 (ref 5–15)
BUN: 18 mg/dL (ref 8–23)
CO2: 30 mmol/L (ref 22–32)
Calcium: 7.7 mg/dL — ABNORMAL LOW (ref 8.9–10.3)
Chloride: 96 mmol/L — ABNORMAL LOW (ref 98–111)
Creatinine, Ser: 1.09 mg/dL — ABNORMAL HIGH (ref 0.44–1.00)
GFR calc Af Amer: >60 mL/min (ref >60)
GFR calc non Af Amer: 52 mL/min — ABNORMAL LOW (ref >60)
Glucose, Bld: 178 mg/dL — ABNORMAL HIGH (ref 70–99)
Potassium: 3.7 mmol/L (ref 3.5–5.1)
Sodium: 138 mmol/L (ref 135–145)

## 2019-03-09 LAB — CBC
HCT: 24.5 % — ABNORMAL LOW (ref 36.0–46.0)
Hemoglobin: 7.1 g/dL — ABNORMAL LOW (ref 12.0–15.0)
MCH: 27.2 pg (ref 26.0–34.0)
MCHC: 29 g/dL — ABNORMAL LOW (ref 30.0–36.0)
MCV: 93.9 fL (ref 80.0–100.0)
Platelets: 326 10*3/uL (ref 150–400)
RBC: 2.61 MIL/uL — ABNORMAL LOW (ref 3.87–5.11)
RDW: 19 % — ABNORMAL HIGH (ref 11.5–15.5)
WBC: 9.9 10*3/uL (ref 4.0–10.5)
nRBC: 0 % (ref 0.0–0.2)

## 2019-03-09 LAB — VANCOMYCIN, TROUGH: Vancomycin Tr: 34 ug/mL (ref 15–20)

## 2019-03-09 LAB — PREPARE RBC (CROSSMATCH)

## 2019-03-09 LAB — MAGNESIUM: Magnesium: 1.2 mg/dL — ABNORMAL LOW (ref 1.7–2.4)

## 2019-03-09 SURGERY — DEBRIDEMENT, WOUND
Anesthesia: General

## 2019-03-09 MED ORDER — OXYCODONE HCL 5 MG PO TABS
5.0000 mg | ORAL_TABLET | ORAL | Status: DC | PRN
  Administered 2019-03-11: 5 mg via ORAL
  Filled 2019-03-09: qty 1

## 2019-03-09 MED ORDER — KETAMINE HCL 50 MG/5ML IJ SOSY
PREFILLED_SYRINGE | INTRAMUSCULAR | Status: AC
  Filled 2019-03-09: qty 5

## 2019-03-09 MED ORDER — SODIUM CHLORIDE 0.9 % IR SOLN
Status: DC | PRN
  Administered 2019-03-09: 3000 mL

## 2019-03-09 MED ORDER — KETAMINE HCL 10 MG/ML IJ SOLN
INTRAMUSCULAR | Status: DC | PRN
  Administered 2019-03-09: 30 mg via INTRAVENOUS

## 2019-03-09 MED ORDER — PROMETHAZINE HCL 25 MG/ML IJ SOLN: 6.2500 mg | INTRAMUSCULAR | Status: DC | PRN

## 2019-03-09 MED ORDER — LACTATED RINGERS IV SOLN
INTRAVENOUS | Status: DC | PRN
  Administered 2019-03-09: 12:00:00 via INTRAVENOUS

## 2019-03-09 MED ORDER — SUGAMMADEX SODIUM 200 MG/2ML IV SOLN
INTRAVENOUS | Status: DC | PRN
  Administered 2019-03-09: 200 mg via INTRAVENOUS

## 2019-03-09 MED ORDER — MIDAZOLAM HCL 2 MG/2ML IJ SOLN: 0.5000 mg | Freq: Once | INTRAMUSCULAR | Status: DC | PRN

## 2019-03-09 MED ORDER — MORPHINE SULFATE (PF) 2 MG/ML IV SOLN
2.0000 mg | INTRAVENOUS | Status: DC | PRN
  Administered 2019-03-09 – 2019-03-11 (×6): 2 mg via INTRAVENOUS
  Filled 2019-03-09 (×6): qty 1

## 2019-03-09 MED ORDER — FENTANYL CITRATE (PF) 100 MCG/2ML IJ SOLN
INTRAMUSCULAR | Status: AC
  Filled 2019-03-09: qty 2

## 2019-03-09 MED ORDER — LACTATED RINGERS IV SOLN: INTRAVENOUS | Status: DC

## 2019-03-09 MED ORDER — PROPOFOL 10 MG/ML IV BOLUS
INTRAVENOUS | Status: DC | PRN
  Administered 2019-03-09: 120 mg via INTRAVENOUS

## 2019-03-09 MED ORDER — HYDROMORPHONE HCL 1 MG/ML IJ SOLN
0.2500 mg | INTRAMUSCULAR | Status: DC | PRN
  Administered 2019-03-09 (×3): 0.5 mg via INTRAVENOUS
  Filled 2019-03-09 (×3): qty 0.5

## 2019-03-09 MED ORDER — HYDROCODONE-ACETAMINOPHEN 7.5-325 MG PO TABS: 1.0000 | ORAL_TABLET | Freq: Once | ORAL | Status: DC | PRN

## 2019-03-09 MED ORDER — SODIUM CHLORIDE 0.9 % IR SOLN
Status: DC | PRN
  Administered 2019-03-09: 1000 mL

## 2019-03-09 MED ORDER — MAGNESIUM SULFATE 50 % IJ SOLN
1.0000 g | Freq: Once | INTRAMUSCULAR | Status: DC
  Filled 2019-03-09: qty 2

## 2019-03-09 MED ORDER — SUCCINYLCHOLINE CHLORIDE 20 MG/ML IJ SOLN
INTRAMUSCULAR | Status: DC | PRN
  Administered 2019-03-09: 140 mg via INTRAVENOUS

## 2019-03-09 MED ORDER — MAGNESIUM SULFATE 50 % IJ SOLN
INTRAMUSCULAR | Status: DC | PRN
  Administered 2019-03-09: 1000 mg via INTRAVENOUS

## 2019-03-09 MED ORDER — MAGNESIUM SULFATE 50 % IJ SOLN: INTRAMUSCULAR | Status: DC | PRN

## 2019-03-09 MED ORDER — MAGNESIUM SULFATE IN D5W 1-5 GM/100ML-% IV SOLN: 1.0000 g | Freq: Once | INTRAVENOUS | Status: DC

## 2019-03-09 SURGICAL SUPPLY — 34 items
APPLICATOR COTTON TIP 6 STRL (MISCELLANEOUS) IMPLANT
APPLICATOR COTTON TIP 6IN STRL (MISCELLANEOUS) ×9
CANISTER WOUND CARE 500ML ATS (WOUND CARE) ×1 IMPLANT
CLOTH BEACON ORANGE TIMEOUT ST (SAFETY) ×3 IMPLANT
COVER LIGHT HANDLE STERIS (MISCELLANEOUS) ×6 IMPLANT
COVER WAND RF STERILE (DRAPES) ×1 IMPLANT
DRSG VAC ATS LRG SENSATRAC (GAUZE/BANDAGES/DRESSINGS) ×1 IMPLANT
ELECT REM PT RETURN 9FT ADLT (ELECTROSURGICAL) ×3
ELECTRODE REM PT RTRN 9FT ADLT (ELECTROSURGICAL) ×2 IMPLANT
GAUZE SPONGE 4X4 12PLY STRL (GAUZE/BANDAGES/DRESSINGS) ×3 IMPLANT
GLOVE BIO SURGEON STRL SZ 6.5 (GLOVE) ×3 IMPLANT
GLOVE BIOGEL PI IND STRL 6.5 (GLOVE) ×2 IMPLANT
GLOVE BIOGEL PI IND STRL 7.0 (GLOVE) ×2 IMPLANT
GLOVE BIOGEL PI INDICATOR 6.5 (GLOVE) ×1
GLOVE BIOGEL PI INDICATOR 7.0 (GLOVE) ×2
GLOVE SURG SS PI 6.5 STRL IVOR (GLOVE) ×1 IMPLANT
GLOVE SURG SS PI 7.0 STRL IVOR (GLOVE) ×1 IMPLANT
GOWN STRL REUS W/TWL LRG LVL3 (GOWN DISPOSABLE) ×6 IMPLANT
IRRIG SUCT STRYKERFLOW 2 WTIP (MISCELLANEOUS) ×3
IRRIGATION SUCT STRKRFLW 2 WTP (MISCELLANEOUS) IMPLANT
IV NS IRRIG 3000ML ARTHROMATIC (IV SOLUTION) ×1 IMPLANT
KIT TURNOVER KIT A (KITS) ×3 IMPLANT
MANIFOLD NEPTUNE II (INSTRUMENTS) ×3 IMPLANT
NDL HYPO 25X1 1.5 SAFETY (NEEDLE) ×2 IMPLANT
NEEDLE HYPO 25X1 1.5 SAFETY (NEEDLE) ×3 IMPLANT
NS IRRIG 1000ML POUR BTL (IV SOLUTION) ×3 IMPLANT
PAD ARMBOARD 7.5X6 YLW CONV (MISCELLANEOUS) ×3 IMPLANT
SET BASIN LINEN APH (SET/KITS/TRAYS/PACK) ×3 IMPLANT
SPONGE LAP 18X18 RF (DISPOSABLE) ×2 IMPLANT
SUT SILK 2 0 SH (SUTURE) ×1 IMPLANT
SUT VIC AB 3-0 SH 27 (SUTURE) ×2
SUT VIC AB 3-0 SH 27X BRD (SUTURE) IMPLANT
SWAB CULTURE LIQ STUART DBL (MISCELLANEOUS) ×1 IMPLANT
SYR BULB IRRIGATION 50ML (SYRINGE) ×1 IMPLANT

## 2019-03-09 NOTE — Op Note (Signed)
[facility] Operative Note  03/09/19  Preoperative Diagnosis:  Chronically abdominal ulcers with necrotic fat exposed, abdominal wound infection    Postoperative Diagnosis: Same   Procedure(s) Performed:  Excisional debridement of skin of the abdomen including ulcers extending down into the subcutaneous tissue (Total excised tissue 20cm long X 7cm wide X 6cm deep)    Surgeon: Lanell Matar. Constance Haw, MD   Assistants: No qualified resident was available    Anesthesia: General endotracheal   Anesthesiologist:  Dr. Hilaria Ota    Specimens: Abdominal wounds/ ulcers- suture marks medial to pathology (r/o pyoderma gangrenosum); Representative tissue samples sent for fungal and bacterial culture; Culture swabs for aerobic and anaerobic    Estimated Blood Loss: 15cc   Blood Replacement: None    Complications: None   Wound Class: Dirty/ Infected    Operative Indications: [name] is a 68 yo with multiple medical comorbidities and a chronic ulcerated wounds on her abdomen that have necrotic fat and infection in them on this admission. She has had these wound since November, and they have been getting progressively worse despite conservative wound management. She was first seen by me a few weeks ago after request for a biopsy to rule out pyoderma gangrenosum, and since that time the ulcer medial has enlarged, bled, and become necrotic with obvious signs of infection with purulent drainage and foul smelling material. She was brought into the hospital for antibiotics, and given the extent of the wound and the infection, I do not think a limited excisional biopsy is appropriate. She needs extensive debridement of the wounds in order to get to healthy tissue, and we will plan to send the tissue to pathology to rule out pyoderma gangrenosum with the understanding that surgical excision could make it worse if this is the diagnosis. We discussed the risk of bleeding, infection, need for  further debridement, worsening of the ulcerated areas/ pyoderma if this is the pathology, and need for wound vacuum placement. We also discussed the risk of cardiac and pulmonary complications and her elevated risk of staying intubated or needing ICU care. The patient understands these risk and wants to proceed with debridement and wound vacuum placement.   Findings: Necrotic medial ulcer with hardened inflamed adipose tissue inferiorly and tracks medial and lateral to this point; lateral ulcer with tracks medial; able to excise down to grossly healthy appearing adipose tissue, no muscle exposed         Procedure: The patient was taken to the operating room and placed supine. General endotracheal anesthesia was induced. Intravenous antibiotics were not given as she has been on antibiotics and cultures are being obtained.  The abdomen was prepared and draped in the usual sterile fashion with [employer] and saline given the open wounds.   An elliptical incision was made around the ulcerated areas and the underlying hardened areas of adipose tissue. This was carried down with sharp excisional debridement with a scalpel and electrocautery.  The specimen was taken in one large sample and care was taken to include the sinus tracks from the ulcers in the specimen.  The excisional debridement extended down and circumferentially to grossly healthy appearing adipose tissue.  The total specimen was 20cm long, 7cm wide, 6cm deep extending across the patient's abdomen horizontally (20 cm horizontal).  The suture marked the medial edge of the excision.  A small vein was closed with a figure of 8 3-0 Vicryl suture closure.  Pulse irrigation was performed with saline. Hemostasis was achieved.  A negative  pressure wound vacuum with black sponge was placed in the wound bed. A large sponge was cut in half and placed. The suction was placed 125 mm Hg. Adequate seal without leak was noted.  The large excised portion was sent  to pathology, and representative samples around the ulcers were taken and sent for tissue culture.  Culture swabs were performed deep in the tracks of the ulcers.   Final inspection revealed acceptable hemostasis. All counts were correct at the end of the case. The patient was awakened from anesthesia and extubated without complication.  The patient went to the PACU in stable condition.   Katrina Labrum, MD [facility] [address] [place], [place] [address] [phone] (office)

## 2019-03-09 NOTE — Progress Notes (Signed)
Pharmacy Antibiotic Note  [name] is a 68 y.o. female admitted on 03/07/2019 with cellulitis.  Pharmacy has been consulted for Vancomycin dosing.  Vanco trough 34- supratherapeutic  Plan:  Hold Vancomycin- will f/u in AM, probable switch to PO antibiotics. Zosyn 3.375g IV every 8 hours Monitor labs, c/s, and vanco levels as indicated.  Height: 5' 4"  (162.6 cm) Weight: 211 lb 10.3 oz (96 kg) IBW/kg (Calculated) : 54.7  Temp (24hrs), Avg:98.2 F (36.8 C), Min:97.9 F (36.6 C), Max:98.6 F (37 C)  Recent Labs  Lab 03/03/19 1047 03/07/19 1915 03/08/19 0509 03/09/19 0535 03/09/19 2046  WBC  --  10.5 9.5 9.9  --   CREATININE 0.90 0.81 0.80 1.09*  --   VANCOTROUGH  --   --   --   --  34*    Estimated Creatinine Clearance: 55.5 mL/min (A) (by C-G formula based on SCr of 1.09 mg/dL (H)).    Allergies  Allergen Reactions  . Feraheme [Ferumoxytol] Other (See Comments)    Back pain (yelling out with back pain)  . Propranolol Swelling    Pt states it may have been leg swelling  . Topamax [Topiramate] Other (See Comments)    hallucinations  . Latex Itching and Rash    Antimicrobials this admission: Vanco 7/20 >>  Zosyn 7/20 >>     Dose adjustments this admission: N/A  Microbiology results: 7/20 BCx: pending  7/20 Wound Cx: pending    Thank you for allowing pharmacy to be a part of this patient's care.  Ramond Craver 03/09/2019 9:53 PM

## 2019-03-09 NOTE — Anesthesia Postprocedure Evaluation (Signed)
Anesthesia Post Note  Patient: [name]  Procedure(s) Performed: EXCISIONAL DEBRIDEMENT OF ABDOMINAL WOUND ULCERS (N/A )  Patient location during evaluation: PACU Anesthesia Type: General Level of consciousness: awake and alert and oriented Pain management: pain level controlled Vital Signs Assessment: post-procedure vital signs reviewed and stable Respiratory status: spontaneous breathing Cardiovascular status: blood pressure returned to baseline and stable Postop Assessment: no apparent nausea or vomiting Anesthetic complications: no     Last Vitals:  Vitals:   03/09/19 1325 03/09/19 1330  BP:  (!) (P) 152/52  Pulse: 60 60  Resp: (!) 7 11  Temp:    SpO2: 95% 100%    Last Pain:  Vitals:   03/09/19 1315  TempSrc:   PainSc: 7                  Bonner Larue

## 2019-03-09 NOTE — Anesthesia Preprocedure Evaluation (Signed)
Anesthesia Evaluation  Patient identified by MRN, date of birth, ID band Patient awake    Reviewed: Allergy & Precautions, NPO status , Patient's Chart, lab work & pertinent test results, reviewed documented beta blocker date and time   Airway Mallampati: II  TM Distance: >3 FB Neck ROM: Full    Dental no notable dental hx.    Pulmonary COPD,    Pulmonary exam normal breath sounds clear to auscultation       Cardiovascular Exercise Tolerance: Poor hypertension, Pt. on medications and Pt. on home beta blockers +CHF  Normal cardiovascular examII Rhythm:Regular Rate:Normal     Neuro/Psych PSYCHIATRIC DISORDERS Depression negative neurological ROS     GI/Hepatic negative GI ROS, Neg liver ROS,   Endo/Other  negative endocrine ROSdiabetesBMI>36 WC/Bed bound -able to self transfer  Renal/GU Renal InsufficiencyRenal disease  negative genitourinary   Musculoskeletal negative musculoskeletal ROS (+)   Abdominal   Peds negative pediatric ROS (+)  Hematology negative hematology ROS (+) anemia ,   Anesthesia Other Findings   Reproductive/Obstetrics negative OB ROS                             Anesthesia Physical Anesthesia Plan  ASA: IV  Anesthesia Plan: General   Post-op Pain Management:    Induction: Intravenous  PONV Risk Score and Plan: 3 and Ondansetron and Treatment may vary due to age or medical condition  Airway Management Planned: Oral ETT  Additional Equipment:   Intra-op Plan:   Post-operative Plan: Extubation in OR  Informed Consent: I have reviewed the patients History and Physical, chart, labs and discussed the procedure including the risks, benefits and alternatives for the proposed anesthesia with the patient or authorized representative who has indicated his/her understanding and acceptance.     Dental advisory given  Plan Discussed with: CRNA  Anesthesia Plan  Comments: (Plan Full PPE use  Plan GETA - d/w pt possibility of post op ventilation -WTP)        Anesthesia Quick Evaluation

## 2019-03-09 NOTE — Progress Notes (Signed)
PROGRESS NOTE    [name]  BHA:[id number]  DOB: 1951-08-16  DOA: 03/07/2019 PCP: [name], MD   Brief Admission Hx: femalewith medical history significant fordiabetes mellitus, hypertension, atrial fibrillation, MRSA infection, diastolic CHF, CKD 3, COPD, depression, who presented to the ED with complaints of chronic abdominal wound with thick foul-smelling drainage.  MDM/Assessment & Plan:   1. 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  Patient is being empirically treated with broad-spectrum antibiotics.  Cultures will be taken in the OR according to surgery.  We will follow-up. 2. Acute on chronic anemia-patient has iron deficiency anemia and likely has some chronic blood loss anemia from the wounds and has been typed and crossed and likely will need further PRBC transfusions given that her hemoglobin is trending down.  Continue to follow. 3. Chronic atrial fibrillation-she is on diltiazem and metoprolol for rate control and apixaban for full anticoagulation which is currently being held for surgery. 4. Chronic diastolic CHF- patient has been resumed on her home Lasix and appears compensated at this time.  Continue to monitor. 5. Type 2 diabetes mellitus- patient is being monitored with a sliding scale coverage.  She had some earlier hypoglycemia and she has been taken off Levemir and glipizide temporarily. 6. Essential hypertension-she has been resumed on her home medications and will follow. 7. COPD- stable and compensated resume home bronchodilators as ordered. 8. Chronic depression-continue Lexapro and bupropion as ordered. 9. 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  DVT prophylaxis: SCDs while apixaban is on hold  Code Status: Full Family Communication:  Disposition Plan: Continue inpatient care for IV antibiotics   Consultants:  Surgery  Procedures:  OR for wound excision and debridement 03/09/2019  Antimicrobials:  Vancomycin 03/08/2019-  Zosyn 03/08/2019-  Subjective: Patient says that she looks forward to going to the OR to have her wounds treated today.  She has no other complaints.  She says that the drainage has remained unchanged.  Objective: Vitals:   03/08/19 1344 03/08/19 2125 03/09/19 0516 03/09/19 1103  BP: (!) 146/46 (!) 129/45 (!) 138/43 (!) 157/54  Pulse: (!) 56 (!) 59 (!) 59 (!) 59  Resp: 16 16 17 18   Temp:  98.4 F (36.9 C) 97.9 F (36.6 C) 98.6 F (37 C)  TempSrc:  Oral Oral Oral  SpO2: 99% 95% 94% 97%  Weight:      Height:        Intake/Output Summary (Last 24 hours) at 03/09/2019 1214 Last data filed at 03/09/2019 0900 Gross per 24 hour  Intake 835.25 ml  Output 2500 ml  Net -1664.75 ml   Filed Weights   03/07/19 1535 03/07/19 2343  Weight: 93.9 kg 96 kg     REVIEW OF SYSTEMS  As per history otherwise all reviewed and reported negative  Exam:  General exam: awake, alert, NAD, cooperative.   Respiratory system: Clear. No increased work of breathing. Cardiovascular system: S1 & S2 heard. No JVD, murmurs, gallops, clicks or pedal edema. Gastrointestinal system: Abdomen is nondistended, soft and nontender. Normal bowel sounds heard. Central nervous system: Alert and oriented. No focal neurological deficits. Skin: chronic open purulent drainage of abdominal pannus wounds 3 seen.       Extremities: no CCE.  Data Reviewed: Basic Metabolic Panel: Recent Labs  Lab 03/03/19 1047 03/07/19 1915 03/08/19 0509 03/09/19 0535  NA 139 135 138 138  K 3.8 3.7 3.2* 3.7  CL 95* 101 102 96*  CO2  --  24 27 30   GLUCOSE 259* 237* 58* 178*  BUN 20 20 17 18   CREATININE 0.90 0.81 0.80 1.09*  CALCIUM  --  7.8* 8.0* 7.7*  MG  --   --   --  1.2*    Liver Function Tests: No results for input(s): AST, ALT, ALKPHOS, BILITOT, PROT, ALBUMIN in the last 168 hours. No results for input(s): LIPASE, AMYLASE in the last 168 hours. No results for input(s): AMMONIA in the last 168 hours. CBC: Recent Labs  Lab 03/03/19 1047 03/07/19 1915 03/08/19 0509 03/09/19 0535  WBC  --  10.5 9.5 9.9  NEUTROABS  --  7.9*  --   --   HGB 8.2* 6.8* 7.9* 7.1*  HCT 24.0* 23.8* 27.0* 24.5*  MCV  --  96.0 94.1 93.9  PLT  --  307 338 326   Cardiac Enzymes: No results for input(s): CKTOTAL, CKMB, CKMBINDEX, TROPONINI in the last 168 hours. CBG (last 3)  Recent Labs    03/09/19 0358 03/09/19 0717 03/09/19 1110  GLUCAP 178* 190* 118*   Recent Results (from the past 240 hour(s))  Blood culture (routine x 2)     Status: None (Preliminary result)   Collection Time: 03/07/19  7:03 PM   Specimen: BLOOD  Result Value Ref Range Status   Specimen Description BLOOD RIGHT ANTECUBITAL  Final   Special Requests   Final    BOTTLES DRAWN AEROBIC AND ANAEROBIC Blood Culture results may not be optimal due to an inadequate volume of blood received in culture bottles   Culture   Final    NO GROWTH 2 DAYS Performed at [facility], [address], [place], [place] [address]    Report Status PENDING  Incomplete  Wound or Superficial Culture     Status: None (Preliminary result)   Collection Time: 03/07/19  7:10 PM   Specimen: Abdomen; Wound  Result Value Ref Range Status   Specimen Description   Final    ABDOMEN Performed at [facility], [address], [place], [place] [address]    Special Requests   Final    Immunocompromised Performed at [facility], [address], [place], [place] [address]    Gram Stain   Final    NO WBC SEEN ABUNDANT GRAM NEGATIVE RODS ABUNDANT GRAM POSITIVE COCCI IN PAIRS IN CLUSTERS RARE GRAM POSITIVE RODS Performed at [facility], [place] [address], [place], [place] [address]    Culture PENDING  Incomplete   Report Status  PENDING  Incomplete  Blood culture (routine x 2)     Status: None (Preliminary result)   Collection Time: 03/07/19  7:18 PM   Specimen: BLOOD  Result Value Ref Range Status   Specimen Description BLOOD LEFT HAND  Final   Special Requests AEROBIC BOTTLE ONLY Blood Culture adequate volume  Final   Culture   Final    NO GROWTH 2 DAYS Performed at [facility], [address] [place], [place] [address]    Report Status PENDING  Incomplete  SARS Coronavirus 2 (CEPHEID - Performed in [place] hospital lab), Hosp Order     Status: None   Collection Time: 03/07/19  7:19 PM   Specimen: Nasopharyngeal Swab  Result Value Ref Range Status   SARS Coronavirus 2 NEGATIVE NEGATIVE Final    Comment: (  NOTE) If result is NEGATIVE SARS-CoV-2 target nucleic acids are NOT DETECTED. The SARS-CoV-2 RNA is generally detectable in upper and lower  respiratory specimens during the acute phase of infection. The lowest  concentration of SARS-CoV-2 viral copies this assay can detect is 250  copies / mL. A negative result does not preclude SARS-CoV-2 infection  and should not be used as the sole basis for treatment or other  patient management decisions.  A negative result may occur with  improper specimen collection / handling, submission of specimen other  than nasopharyngeal swab, presence of viral mutation(s) within the  areas targeted by this assay, and inadequate number of viral copies  (<250 copies / mL). A negative result must be combined with clinical  observations, patient history, and epidemiological information. If result is POSITIVE SARS-CoV-2 target nucleic acids are DETECTED. The SARS-CoV-2 RNA is generally detectable in upper and lower  respiratory specimens dur ing the acute phase of infection.  Positive  results are indicative of active infection with SARS-CoV-2.  Clinical  correlation with patient history and other diagnostic information is  necessary to determine patient infection  status.  Positive results do  not rule out bacterial infection or co-infection with other viruses. If result is PRESUMPTIVE POSTIVE SARS-CoV-2 nucleic acids MAY BE PRESENT.   A presumptive positive result was obtained on the submitted specimen  and confirmed on repeat testing.  While 2019 novel coronavirus  (SARS-CoV-2) nucleic acids may be present in the submitted sample  additional confirmatory testing may be necessary for epidemiological  and / or clinical management purposes  to differentiate between  SARS-CoV-2 and other Sarbecovirus currently known to infect humans.  If clinically indicated additional testing with an alternate test  methodology [phone]) is advised. The SARS-CoV-2 RNA is generally  detectable in upper and lower respiratory sp ecimens during the acute  phase of infection. The expected result is Negative. Fact Sheet for Patients:  StrictlyIdeas.no Fact Sheet for Healthcare Providers: BankingDealers.co.za This test is not yet approved or cleared by the [place] FDA and has been authorized for detection and/or diagnosis of SARS-CoV-2 by FDA under an Emergency Use Authorization (EUA).  This EUA will remain in effect (meaning this test can be used) for the duration of the COVID-19 declaration under Section 564(b)(1) of the Act, 21 U.S.C. section 360bbb-3(b)(1), unless the authorization is terminated or revoked sooner. Performed at [facility], [address], [place], [place] [address]      Studies: Ct Abdomen Pelvis W Contrast  Result Date: 03/07/2019 CLINICAL DATA:  68 year old with a wound involving the skin of the RIGHT lower abdominal wall overlying the low pelvis, presenting now with bleeding and a foul over emanating from the wound. Surgical history includes hysterectomy, cholecystectomy and remote pancreatic cyst drainage. EXAM: CT ABDOMEN AND PELVIS WITH CONTRAST TECHNIQUE: Multidetector CT imaging of the  abdomen and pelvis was performed using the standard protocol following bolus administration of intravenous contrast. CONTRAST:  145m OMNIPAQUE IOHEXOL 300 MG/ML IV. COMPARISON:  01/10/2019 and earlier. FINDINGS: Lower chest: Minimal scar and bronchiectasis involving the lower lobes. Visualized lung bases otherwise clear. Heart moderately enlarged with dense mitral annular calcification and mild RIGHT coronary and LEFT circumflex coronary artery calcification. Hepatobiliary: Liver normal in size and appearance. Surgically absent gallbladder. No unexpected biliary ductal dilation. Pancreas: Severely atrophic. No pancreatic mass or peripancreatic inflammation. Spleen: Calcified granuloma in the LOWER pole. No significant abnormalities. Adrenals/Urinary Tract: Normal appearing adrenal glands. Scarring involving both kidneys. No masses involving either kidney.  No hydronephrosis. No urinary tract calculi. Gas in the bladder lumen and gas involving the lateral walls of the urinary bladder; the interstitial gas is a new finding since the prior CT. Stomach/Bowel: Stomach normal in appearance for the degree of distention. Normal-appearing small bowel. Moderate colonic stool burden. Descending and sigmoid colon diverticulosis without evidence of acute diverticulitis. Appendix not conspicuous, but no pericecal inflammation. Vascular/Lymphatic: Moderate to severe aortoiliofemoral atherosclerosis without evidence of aneurysm. No pathologic lymphadenopathy. Reproductive: RIGHT ovarian cysts which are stable dating back to 09/03/2018, the largest measuring approximately 3.6 x 4.5 cm. Surgically absent uterus. No LEFT adnexal masses. Other: Gas-filled wound in the subcutaneous fat of the RIGHT LOWER abdominal wall overlying the low pelvis. No evidence of underlying abscess. Prominent subcutaneous vein immediately adjacent to the wound which likely accounts for the bleeding. Mild diffuse body wall edema as noted previously.  Sarcopenia as noted previously. Musculoskeletal: Osseous demineralization. Severe degenerative disc disease at L4-5 and L5-S1 with a central disc protrusion or extrusion at L4-5. No acute findings. IMPRESSION: 1. Gas-filled wound in the subcutaneous fat of the RIGHT lower abdominal wall overlying the low pelvis. No evidence of underlying abscess. A prominent subcutaneous vein adjacent to the wound likely accounts for the recent bleeding. 2. Gas in the bladder lumen and gas involving the lateral walls of the urinary bladder, query recent instrumentation. The interstitial gas is a new finding since the 01/10/2019 CT and raises the question of cystitis. 3. Stable RIGHT ovarian cysts dating back to 09/03/2018, the largest measuring up to 4.5 cm. 4. Descending and sigmoid colon diverticulosis without evidence of acute diverticulitis. 5. Severe pancreatic atrophy. Aortic Atherosclerosis (ICD10-I70.0). Electronically Signed   By: Evangeline Dakin M.D.   On: 03/07/2019 20:32   [place] Ekg Site Rite  Result Date: 03/08/2019 If Site Rite image not attached, placement could not be confirmed due to current cardiac rhythm.    Scheduled Meds: . [MAR Hold] buPROPion  300 mg Oral Daily  . [MAR Hold] diltiazem  120 mg Oral Daily  . [MAR Hold] escitalopram  10 mg Oral Daily  . [MAR Hold] ferrous sulfate  325 mg Oral BID WC  . [MAR Hold] furosemide  40 mg Oral BID  . [MAR Hold] insulin aspart  0-15 Units Subcutaneous Q4H  . magnesium sulfate  1 g Intravenous Once  . [MAR Hold] metoprolol tartrate  50 mg Oral BID  . [MAR Hold] sodium chloride flush  10-40 mL Intracatheter Q12H   Continuous Infusions: . [MAR Hold] piperacillin-tazobactam (ZOSYN)  IV 3.375 g (03/09/19 0537)  . [MAR Hold] vancomycin 1,000 mg (03/09/19 0945)    Active Problems:   Abdominal wall ulcer, with fat layer exposed ([place])   Abdominal wall skin ulcer ([place])   Chronic wound infection of abdomen   Time spent:   Irwin Brakeman, MD [facility] 03/09/2019, 12:14 PM    LOS: 2 days  How to contact the [facility] Attending or Consulting provider [place] or covering provider during after hours [place], for this patient?  1. Check the care team in [facility] and look for a) attending/consulting [facility] provider listed and b) the [facility] team listed 2. Log into [email or web address] and use [place]'s universal password to access. If you do not have the password, please contact the hospital operator. 3. Locate the [facility] provider you are looking for under [facility] and page to a number that you can be directly reached. 4. If you still have difficulty reaching the provider, please  page the [facility] (Director on Call) for the Hospitalists listed on amion for assistance.

## 2019-03-09 NOTE — Progress Notes (Signed)
CRITICAL VALUE ALERT  Critical Value:  Vanc 34  Date & Time Notied:  03/09/2019 2124  Provider Notified: [place]  Orders Received/Actions taken: no new orders at this time.

## 2019-03-09 NOTE — Progress Notes (Addendum)
[facility]  Excisional debridement performed. Wound vacuum in place and can be changed Friday.  Tissue sent to pathology and for culture. Continue antibiotics for now while inpatient.  Can switch over to oral.  Blood available but patient did not require intraoperative, only 15 cc EBL.  Can resume home health RN with wound vacuum changes every three week.  Wound measures 20cm X 7cm X 6cm deep. Black sponge to 125 mm Hg.  Hold the eliquis for now until after the wound vacuum change.   Pain meds PRN ordered. Diet ordered.  Updated Dr. Wynetta Emery and Husband Jenny Reichmann.   Curlene Labrum, MD [facility] [address] [place], [place] [address] [phone] (office)

## 2019-03-09 NOTE — Anesthesia Procedure Notes (Signed)
Procedure Name: Intubation Date/Time: 03/09/2019 11:57 AM Performed by: Charmaine Downs, CRNA Pre-anesthesia Checklist: Patient identified, Patient being monitored, Timeout performed, Emergency Drugs available and Suction available Patient Re-evaluated:Patient Re-evaluated prior to induction Oxygen Delivery Method: Circle System Utilized Preoxygenation: Pre-oxygenation with 100% oxygen Induction Type: IV induction Ventilation: Mask ventilation without difficulty Laryngoscope Size: Mac and 3 Grade View: Grade II Tube type: Oral Tube size: 7.0 mm Number of attempts: 1 Airway Equipment and Method: stylet Placement Confirmation: ETT inserted through vocal cords under direct vision,  positive ETCO2 and breath sounds checked- equal and bilateral Secured at: 22 cm Tube secured with: Tape Dental Injury: Teeth and Oropharynx as per pre-operative assessment

## 2019-03-09 NOTE — Consult Note (Addendum)
[facility] Consult  Reason for Consult: Abdominal ulcers  Referring Physician:  Dr. Manuella Ghazi   Chief Complaint    Wound Check      [name] is a 68 y.o. female.  HPI: Patient known to me after previous inpatient consult and outpatient visit. We had plans to take the patient for excisional biopsy of these wounds as a minor procedure later this week, but she has had worsening issues with the wounds over the last week since seeing me in the office. She has come to the ED with bleeding from the wound and had her Hgb drop to 7 range, and has had more necrotic appearing fat under the ulcerated area of skin. She says there has been a foul smell and drainage coming from the wound. She had reported to me that these first developed November 2019, and have been getting progressively worse. She has home health RN that comes to do wound dressing changes, and they have tried a variety of techniques without any improvement. When I first was asked to be involved, Dr. Manuella Ghazi had questions the possibility of pyoderma gangrenosum.  I have read about this and had planned to do a limited excisional biopsy to see if we could get a diagnosis, due to the fact that surgical intervention can make the pyoderma gangrenosum worse.  At this point the wound is infected with necrotic tissue and this limited approach is no longer an option.  I have thoroughly discussed this with the patient and she understands that we need wide debridement to get the infected and necrotic tissue clear, and that we will plan for a wound vacuum to be placed. She understands that this could make pyoderma gangrenosum worse, if that was the end pathologic diagnosis.   Her Eliquis has been held since her admission, but her Hgb remains low.    Past Medical History:  Diagnosis Date   Atrial fibrillation (HCC)    CHF (congestive heart failure) ([place])    Depression    Diabetes mellitus without complication ([place])    History of  transesophageal echocardiography (TEE) 03/2018   LV thrombus   Hypertension    Morbid obesity ([place])    Osteoporosis    Urinary incontinence    UTI (lower urinary tract infection) 01/2016   Cipro for Klebsiella pneumoniae isolate   Wheelchair bound    bedbound    Past Surgical History:  Procedure Laterality Date   ABDOMINAL HYSTERECTOMY     BIOPSY  09/06/2018   Procedure: BIOPSY;  Surgeon: Danie Binder, MD;  [facility];  Service: Endoscopy;;  gastric bx's   CESAREAN SECTION     CHOLECYSTECTOMY     ESOPHAGEAL DILATION  09/06/2018   Procedure: ESOPHAGEAL DILATION;  Surgeon: Danie Binder, MD;  [facility];  Service: Endoscopy;;   ESOPHAGOGASTRODUODENOSCOPY (EGD) WITH PROPOFOL N/A 09/06/2018   Procedure: ESOPHAGOGASTRODUODENOSCOPY (EGD) WITH PROPOFOL;  Surgeon: Danie Binder, MD;  [facility];  Service: Endoscopy;  Laterality: N/A;  dilatation   FEMUR IM NAIL Left 02/20/2016   FEMUR IM NAIL Left 02/20/2016   Procedure: INTRAMEDULLARY (IM) RETROGRADE FEMORAL NAILING;  Surgeon: Leandrew Koyanagi, MD;  Location: [place];  Service: Orthopedics;  Laterality: Left;   PANCREAS SURGERY  1967   1 cyst excised and one cyst drained   TEE WITHOUT CARDIOVERSION N/A 04/05/2018   Procedure: TRANSESOPHAGEAL ECHOCARDIOGRAM (TEE);  Surgeon: Dorothy Spark, MD;  Location: [place];  Service: Cardiovascular;  Laterality:  N/A;   VEIN LIGATION AND STRIPPING     WRIST FRACTURE SURGERY      Family History  Problem Relation Age of Onset   Diabetes Mother        died in her 71's of a stroke   Cancer Mother    Stroke Mother    Breast cancer Mother    Diabetes Father        died in his 39's of a stroke.   Stroke Father    Diabetes Brother        died @ 20 of a stroke.   Stroke Brother    Diabetes Maternal Grandmother    Diabetes Maternal Grandfather    Diabetes Paternal Grandmother    Diabetes Paternal Grandfather    Breast  cancer Maternal Aunt     Social History   Tobacco Use   Smoking status: Never Smoker   Smokeless tobacco: Never Used  Substance Use Topics   Alcohol use: No   Drug use: No    Medications:  I have reviewed the patient's current medications. Prior to Admission:  Medications Prior to Admission  Medication Sig Dispense Refill Last Dose   acetaminophen (TYLENOL) 500 MG tablet Take 500-1,000 mg by mouth every 8 (eight) hours as needed for mild pain or headache.    Past Month at Unknown time   albuterol (PROVENTIL HFA;VENTOLIN HFA) 108 (90 Base) MCG/ACT inhaler Inhale 2 puffs into the lungs every 6 (six) hours as needed for wheezing or shortness of breath. (Patient taking differently: Inhale 2 puffs into the lungs every 6 (six) hours as needed for wheezing or shortness of breath (As needed). ) 1 Inhaler 2 Past Month at Unknown time   apixaban (ELIQUIS) 5 MG TABS tablet Take 1 tablet (5 mg total) by mouth 2 (two) times daily. 60 tablet 3 03/06/2019 at 2000   buPROPion (WELLBUTRIN XL) 300 MG 24 hr tablet Take 300 mg by mouth daily.    03/07/2019 at Unknown time   diltiazem (CARDIZEM CD) 120 MG 24 hr capsule Take 1 capsule (120 mg total) by mouth daily. (Patient taking differently: Take 120 mg by mouth daily. Cartia XT)   03/07/2019 at Unknown time   escitalopram (LEXAPRO) 10 MG tablet Take 10 mg by mouth daily.   03/07/2019 at Unknown time   ferrous sulfate (FERROUSUL) 325 (65 FE) MG tablet Take 1 tablet (325 mg total) by mouth 2 (two) times daily with a meal. 60 tablet 0 03/07/2019 at Unknown time   fluticasone (FLONASE) 50 MCG/ACT nasal spray Place 2 sprays into both nostrils daily as needed for allergies or rhinitis (congestion).    Past Month at Unknown time   furosemide (LASIX) 20 MG tablet Take 3 tablets (60 mg total) by mouth 2 (two) times daily. 180 tablet 0 03/06/2019 at Unknown time   glipiZIDE (GLUCOTROL) 10 MG tablet Take 10 mg by mouth 2 (two) times daily.  2 03/07/2019 at  Unknown time   levalbuterol (XOPENEX) 0.63 MG/3ML nebulizer solution Take 0.63 mg by nebulization every 6 (six) hours as needed for wheezing or shortness of breath.    Past Month at Unknown time   LEVEMIR FLEXTOUCH 100 UNIT/ML Pen Inject 10 Units into the skin 2 (two) times a day.   03/07/2019 at Unknown time   metoprolol tartrate (LOPRESSOR) 50 MG tablet Take 50 mg by mouth 2 (two) times daily.    03/07/2019 at 800   nystatin (MYCOSTATIN/NYSTOP) powder Apply topically daily as needed (rash in skin  folds).   Past Month at Unknown time   OVER THE COUNTER MEDICATION Apply 1 application topically daily as needed (rash in skin folds). Baby butt paste   Past Month at Unknown time   pantoprazole (PROTONIX) 40 MG tablet Take 1 tablet (40 mg total) by mouth daily before breakfast. (Patient taking differently: Take 40 mg by mouth daily as needed (for GERD/acid reflux). )   Past Month at Unknown time   Scheduled:  [MAR Hold] buPROPion  300 mg Oral Daily   [MAR Hold] diltiazem  120 mg Oral Daily   [MAR Hold] escitalopram  10 mg Oral Daily   [MAR Hold] ferrous sulfate  325 mg Oral BID WC   [MAR Hold] furosemide  40 mg Oral BID   [MAR Hold] insulin aspart  0-15 Units Subcutaneous Q4H   [MAR Hold] metoprolol tartrate  50 mg Oral BID   [MAR Hold] sodium chloride flush  10-40 mL Intracatheter Q12H   Continuous:  [MAR Hold] piperacillin-tazobactam (ZOSYN)  IV 3.375 g (03/09/19 0537)   [MAR Hold] vancomycin 1,000 mg (03/09/19 0945)   PRN:[MAR Hold] acetaminophen **OR** [MAR Hold] acetaminophen, [MAR Hold] ipratropium-albuterol, [MAR Hold] ondansetron **OR** [MAR Hold] ondansetron (ZOFRAN) IV, [MAR Hold] polyethylene glycol, [MAR Hold] sodium chloride flush  Allergies  Allergen Reactions   Feraheme [Ferumoxytol] Other (See Comments)    Back pain (yelling out with back pain)   Propranolol Swelling    Pt states it may have been leg swelling   Topamax [Topiramate] Other (See Comments)     hallucinations   Latex Itching and Rash     ROS:  A comprehensive review of systems was negative except for: open draining abdominal wounds with necrotic tissue  Blood pressure (!) 157/54, pulse (!) 59, temperature 98.6 F (37 C), temperature source Oral, resp. rate 18, height 5' 4"  (1.626 m), weight 96 kg, SpO2 97 %. Physical Exam Vitals signs reviewed.  HENT:     Head: Normocephalic.     Nose: Nose normal.     Mouth/Throat:     Mouth: Mucous membranes are moist.  Eyes:     Pupils: Pupils are equal, round, and reactive to light.  Neck:     Musculoskeletal: Normal range of motion.  Cardiovascular:     Rate and Rhythm: Normal rate.  Pulmonary:     Effort: Pulmonary effort is normal.  Abdominal:     General: There is no distension.     Palpations: Abdomen is soft.     Tenderness: There is abdominal tenderness.     Comments: Right abdomen lower pannus with ulcerative wounds and necrotic fat and purulent drainage, extends medial to lateral with additional ulcerated area lateral with bridge of erythema between  Musculoskeletal:        General: No deformity.  Skin:    General: Skin is warm and dry.  Neurological:     General: No focal deficit present.     Mental Status: She is alert and oriented to person, place, and time.  Psychiatric:        Mood and Affect: Mood normal.        Behavior: Behavior normal.        Thought Content: Thought content normal.        Judgment: Judgment normal.       Results: Results for orders placed or performed during the hospital encounter of 03/07/19 (from the past 48 hour(s))  Blood culture (routine x 2)     Status: None (Preliminary result)  Collection Time: 03/07/19  7:03 PM   Specimen: BLOOD  Result Value Ref Range   Specimen Description BLOOD RIGHT ANTECUBITAL    Special Requests      BOTTLES DRAWN AEROBIC AND ANAEROBIC Blood Culture results may not be optimal due to an inadequate volume of blood received in culture bottles    Culture      NO GROWTH 2 DAYS Performed at [facility], [address], [place], [place] [address]    Report Status PENDING   Wound or Superficial Culture     Status: None (Preliminary result)   Collection Time: 03/07/19  7:10 PM   Specimen: Abdomen; Wound  Result Value Ref Range   Specimen Description      ABDOMEN Performed at [name], [address], [place], [place] [address]    Special Requests      Immunocompromised Performed at [facility], [address] [place], [place] [address]    Gram Stain      NO WBC SEEN ABUNDANT GRAM NEGATIVE RODS ABUNDANT GRAM POSITIVE COCCI IN PAIRS IN CLUSTERS RARE GRAM POSITIVE RODS Performed at [facility], [place] [address], [place], [place] [address]    Culture PENDING    Report Status PENDING   Basic metabolic panel     Status: Abnormal   Collection Time: 03/07/19  7:15 PM  Result Value Ref Range   Sodium 135 135 - 145 mmol/L   Potassium 3.7 3.5 - 5.1 mmol/L   Chloride 101 98 - 111 mmol/L   CO2 24 22 - 32 mmol/L   Glucose, Bld 237 (H) 70 - 99 mg/dL   BUN 20 8 - 23 mg/dL   Creatinine, Ser 0.81 0.44 - 1.00 mg/dL   Calcium 7.8 (L) 8.9 - 10.3 mg/dL   GFR calc non Af Amer >60 >60 mL/min   GFR calc Af Amer >60 >60 mL/min   Anion gap 10 5 - 15    Comment: Performed at [facility], [address] [place], [place] [address]  CBC with Differential     Status: Abnormal   Collection Time: 03/07/19  7:15 PM  Result Value Ref Range   WBC 10.5 4.0 - 10.5 K/uL   RBC 2.48 (L) 3.87 - 5.11 MIL/uL   Hemoglobin 6.8 (LL) 12.0 - 15.0 g/dL    Comment: REPEATED TO VERIFY THIS CRITICAL RESULT HAS VERIFIED AND BEEN CALLED TO B DANIELS,RN BY MARIE KELLY ON 07 20 2020 AT 1939, AND HAS BEEN READ BACK.     HCT 23.8 (L) 36.0 - 46.0 %   MCV 96.0 80.0 - 100.0 fL   MCH 27.4 26.0 - 34.0 pg   MCHC 28.6 (L) 30.0 - 36.0 g/dL   RDW 19.6 (H) 11.5 - 15.5 %   Platelets 307 150 - 400 K/uL   nRBC 0.0 0.0 - 0.2 %   Neutrophils Relative % 76 %   Neutro Abs  7.9 (H) 1.7 - 7.7 K/uL   Lymphocytes Relative 13 %   Lymphs Abs 1.4 0.7 - 4.0 K/uL   Monocytes Relative 8 %   Monocytes Absolute 0.9 0.1 - 1.0 K/uL   Eosinophils Relative 2 %   Eosinophils Absolute 0.2 0.0 - 0.5 K/uL   Basophils Relative 0 %   Basophils Absolute 0.0 0.0 - 0.1 K/uL   Immature Granulocytes 1 %   Abs Immature Granulocytes 0.06 0.00 - 0.07 K/uL    Comment: Performed at [facility], [address], [place], [place] [address]  Blood  culture (routine x 2)     Status: None (Preliminary result)   Collection Time: 03/07/19  7:18 PM   Specimen: BLOOD  Result Value Ref Range   Specimen Description BLOOD LEFT HAND    Special Requests AEROBIC BOTTLE ONLY Blood Culture adequate volume    Culture      NO GROWTH 2 DAYS Performed at [facility], [address], [place], [place] [address]    Report Status PENDING   SARS Coronavirus 2 (CEPHEID - Performed in [place] hospital lab), Hosp Order     Status: None   Collection Time: 03/07/19  7:19 PM   Specimen: Nasopharyngeal Swab  Result Value Ref Range   SARS Coronavirus 2 NEGATIVE NEGATIVE    Comment: (NOTE) If result is NEGATIVE SARS-CoV-2 target nucleic acids are NOT DETECTED. The SARS-CoV-2 RNA is generally detectable in upper and lower  respiratory specimens during the acute phase of infection. The lowest  concentration of SARS-CoV-2 viral copies this assay can detect is 250  copies / mL. A negative result does not preclude SARS-CoV-2 infection  and should not be used as the sole basis for treatment or other  patient management decisions.  A negative result may occur with  improper specimen collection / handling, submission of specimen other  than nasopharyngeal swab, presence of viral mutation(s) within the  areas targeted by this assay, and inadequate number of viral copies  (<250 copies / mL). A negative result must be combined with clinical  observations, patient history, and epidemiological information. If result is  POSITIVE SARS-CoV-2 target nucleic acids are DETECTED. The SARS-CoV-2 RNA is generally detectable in upper and lower  respiratory specimens dur ing the acute phase of infection.  Positive  results are indicative of active infection with SARS-CoV-2.  Clinical  correlation with patient history and other diagnostic information is  necessary to determine patient infection status.  Positive results do  not rule out bacterial infection or co-infection with other viruses. If result is PRESUMPTIVE POSTIVE SARS-CoV-2 nucleic acids MAY BE PRESENT.   A presumptive positive result was obtained on the submitted specimen  and confirmed on repeat testing.  While 2019 novel coronavirus  (SARS-CoV-2) nucleic acids may be present in the submitted sample  additional confirmatory testing may be necessary for epidemiological  and / or clinical management purposes  to differentiate between  SARS-CoV-2 and other Sarbecovirus currently known to infect humans.  If clinically indicated additional testing with an alternate test  methodology [id number]) is advised. The SARS-CoV-2 RNA is generally  detectable in upper and lower respiratory sp ecimens during the acute  phase of infection. The expected result is Negative. Fact Sheet for Patients:  StrictlyIdeas.no Fact Sheet for Healthcare Providers: BankingDealers.co.za This test is not yet approved or cleared by the [place] FDA and has been authorized for detection and/or diagnosis of SARS-CoV-2 by FDA under an Emergency Use Authorization (EUA).  This EUA will remain in effect (meaning this test can be used) for the duration of the COVID-19 declaration under Section 564(b)(1) of the Act, 21 U.S.C. section 360bbb-3(b)(1), unless the authorization is terminated or revoked sooner. Performed at [facility], [address], [place], [place] [address]   Urinalysis, Routine w reflex microscopic     Status: Abnormal    Collection Time: 03/07/19  9:04 PM  Result Value Ref Range   Color, Urine YELLOW YELLOW   APPearance HAZY (A) CLEAR   Specific Gravity, Urine 1.024 1.005 - 1.030   pH 6.0 5.0 -  8.0   Glucose, UA NEGATIVE NEGATIVE mg/dL   Hgb urine dipstick NEGATIVE NEGATIVE   Bilirubin Urine NEGATIVE NEGATIVE   Ketones, ur NEGATIVE NEGATIVE mg/dL   Protein, ur 100 (A) NEGATIVE mg/dL   Nitrite NEGATIVE NEGATIVE   Leukocytes,Ua MODERATE (A) NEGATIVE   RBC / HPF 0-5 0 - 5 RBC/hpf   WBC, UA >50 (H) 0 - 5 WBC/hpf   Bacteria, UA FEW (A) NONE SEEN   Squamous Epithelial / LPF 0-5 0 - 5    Comment: Performed at [facility], [address], [place], [place] [address]  Type and screen [facility]     Status: None   Collection Time: 03/07/19 11:36 PM  Result Value Ref Range   ABO/RH(D) A POS    Antibody Screen NEG    Sample Expiration 03/10/2019,2359    Unit Number [id number]    Blood Component Type RBC LR PHER1    Unit division 00    Status of Unit ISSUED,FINAL    Transfusion Status OK TO TRANSFUSE    Crossmatch Result      Compatible Performed at [facility]: [place] [facility], [address], [place], [place] [address]   Prepare RBC     Status: None   Collection Time: 03/07/19 11:36 PM  Result Value Ref Range   Order Confirmation      ORDER PROCESSED BY BLOOD BANK Performed at [facility], [address] [place], [place] [address]   Glucose, capillary     Status: Abnormal   Collection Time: 03/07/19 11:45 PM  Result Value Ref Range   Glucose-Capillary 276 (H) 70 - 99 mg/dL  Glucose, capillary     Status: Abnormal   Collection Time: 03/08/19  4:02 AM  Result Value Ref Range   Glucose-Capillary 23 (LL) 70 - 99 mg/dL   Comment 1 Notify RN   Glucose, capillary     Status: Abnormal   Collection Time: 03/08/19  4:25 AM  Result Value Ref Range   Glucose-Capillary 113 (H) 70 - 99 mg/dL  Glucose, capillary     Status: Abnormal   Collection Time: 03/08/19  5:02 AM  Result Value Ref Range    Glucose-Capillary 61 (L) 70 - 99 mg/dL  Basic metabolic panel     Status: Abnormal   Collection Time: 03/08/19  5:09 AM  Result Value Ref Range   Sodium 138 135 - 145 mmol/L   Potassium 3.2 (L) 3.5 - 5.1 mmol/L   Chloride 102 98 - 111 mmol/L   CO2 27 22 - 32 mmol/L   Glucose, Bld 58 (L) 70 - 99 mg/dL   BUN 17 8 - 23 mg/dL   Creatinine, Ser 0.80 0.44 - 1.00 mg/dL   Calcium 8.0 (L) 8.9 - 10.3 mg/dL   GFR calc non Af Amer >60 >60 mL/min   GFR calc Af Amer >60 >60 mL/min   Anion gap 9 5 - 15    Comment: Performed at [facility], [address] [place], [place] [address]  CBC     Status: Abnormal   Collection Time: 03/08/19  5:09 AM  Result Value Ref Range   WBC 9.5 4.0 - 10.5 K/uL   RBC 2.87 (L) 3.87 - 5.11 MIL/uL   Hemoglobin 7.9 (L) 12.0 - 15.0 g/dL   HCT 27.0 (L) 36.0 - 46.0 %   MCV 94.1 80.0 - 100.0 fL   MCH 27.5 26.0 - 34.0 pg   MCHC 29.3 (L) 30.0 - 36.0 g/dL   RDW 18.9 (H) 11.5 -  15.5 %   Platelets 338 150 - 400 K/uL   nRBC 0.0 0.0 - 0.2 %    Comment: Performed at [facility], [address] [place], [place] [address]  Glucose, capillary     Status: None   Collection Time: 03/08/19  5:43 AM  Result Value Ref Range   Glucose-Capillary 95 70 - 99 mg/dL  Glucose, capillary     Status: Abnormal   Collection Time: 03/08/19  7:35 AM  Result Value Ref Range   Glucose-Capillary 50 (L) 70 - 99 mg/dL  Glucose, capillary     Status: Abnormal   Collection Time: 03/08/19  8:13 AM  Result Value Ref Range   Glucose-Capillary 124 (H) 70 - 99 mg/dL  Glucose, capillary     Status: Abnormal   Collection Time: 03/08/19 11:26 AM  Result Value Ref Range   Glucose-Capillary 59 (L) 70 - 99 mg/dL  Glucose, capillary     Status: Abnormal   Collection Time: 03/08/19  4:33 PM  Result Value Ref Range   Glucose-Capillary 134 (H) 70 - 99 mg/dL  Glucose, capillary     Status: None   Collection Time: 03/08/19  8:20 PM  Result Value Ref Range   Glucose-Capillary 84 70 - 99 mg/dL  Glucose,  capillary     Status: Abnormal   Collection Time: 03/09/19 12:07 AM  Result Value Ref Range   Glucose-Capillary 178 (H) 70 - 99 mg/dL  Glucose, capillary     Status: Abnormal   Collection Time: 03/09/19  3:58 AM  Result Value Ref Range   Glucose-Capillary 178 (H) 70 - 99 mg/dL  CBC     Status: Abnormal   Collection Time: 03/09/19  5:35 AM  Result Value Ref Range   WBC 9.9 4.0 - 10.5 K/uL   RBC 2.61 (L) 3.87 - 5.11 MIL/uL   Hemoglobin 7.1 (L) 12.0 - 15.0 g/dL   HCT 24.5 (L) 36.0 - 46.0 %   MCV 93.9 80.0 - 100.0 fL   MCH 27.2 26.0 - 34.0 pg   MCHC 29.0 (L) 30.0 - 36.0 g/dL   RDW 19.0 (H) 11.5 - 15.5 %   Platelets 326 150 - 400 K/uL   nRBC 0.0 0.0 - 0.2 %    Comment: Performed at [facility], [address], [place], [place] [address]  Basic metabolic panel     Status: Abnormal   Collection Time: 03/09/19  5:35 AM  Result Value Ref Range   Sodium 138 135 - 145 mmol/L   Potassium 3.7 3.5 - 5.1 mmol/L   Chloride 96 (L) 98 - 111 mmol/L   CO2 30 22 - 32 mmol/L   Glucose, Bld 178 (H) 70 - 99 mg/dL   BUN 18 8 - 23 mg/dL   Creatinine, Ser 1.09 (H) 0.44 - 1.00 mg/dL   Calcium 7.7 (L) 8.9 - 10.3 mg/dL   GFR calc non Af Amer 52 (L) >60 mL/min   GFR calc Af Amer >60 >60 mL/min   Anion gap 12 5 - 15    Comment: Performed at [facility], [address] [place], [place] [address]  Magnesium     Status: Abnormal   Collection Time: 03/09/19  5:35 AM  Result Value Ref Range   Magnesium 1.2 (L) 1.7 - 2.4 mg/dL    Comment: Performed at [facility], [address] [place], [place] [address]  Glucose, capillary     Status: Abnormal   Collection Time: 03/09/19  7:17 AM  Result Value  Ref Range   Glucose-Capillary 190 (H) 70 - 99 mg/dL  Glucose, capillary     Status: Abnormal   Collection Time: 03/09/19 11:10 AM  Result Value Ref Range   Glucose-Capillary 118 (H) 70 - 99 mg/dL   Personally reviewed CT- area on the right abdomen with ulcerated skin corresponding to area on CT with gas  tracking down into the fat, hazy/ stranding of the fat, no abscess or drainable collection Ct Abdomen Pelvis W Contrast  Result Date: 03/07/2019 CLINICAL DATA:  68 year old with a wound involving the skin of the RIGHT lower abdominal wall overlying the low pelvis, presenting now with bleeding and a foul over emanating from the wound. Surgical history includes hysterectomy, cholecystectomy and remote pancreatic cyst drainage. EXAM: CT ABDOMEN AND PELVIS WITH CONTRAST TECHNIQUE: Multidetector CT imaging of the abdomen and pelvis was performed using the standard protocol following bolus administration of intravenous contrast. CONTRAST:  160m OMNIPAQUE IOHEXOL 300 MG/ML IV. COMPARISON:  01/10/2019 and earlier. FINDINGS: Lower chest: Minimal scar and bronchiectasis involving the lower lobes. Visualized lung bases otherwise clear. Heart moderately enlarged with dense mitral annular calcification and mild RIGHT coronary and LEFT circumflex coronary artery calcification. Hepatobiliary: Liver normal in size and appearance. Surgically absent gallbladder. No unexpected biliary ductal dilation. Pancreas: Severely atrophic. No pancreatic mass or peripancreatic inflammation. Spleen: Calcified granuloma in the LOWER pole. No significant abnormalities. Adrenals/Urinary Tract: Normal appearing adrenal glands. Scarring involving both kidneys. No masses involving either kidney. No hydronephrosis. No urinary tract calculi. Gas in the bladder lumen and gas involving the lateral walls of the urinary bladder; the interstitial gas is a new finding since the prior CT. Stomach/Bowel: Stomach normal in appearance for the degree of distention. Normal-appearing small bowel. Moderate colonic stool burden. Descending and sigmoid colon diverticulosis without evidence of acute diverticulitis. Appendix not conspicuous, but no pericecal inflammation. 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 No pathologic lymphadenopathy. Reproductive: RIGHT ovarian cysts which are stable dating back to 09/03/2018, the largest measuring approximately 3.6 x 4.5 cm. Surgically absent uterus. No LEFT adnexal masses. Other: Gas-filled wound in the subcutaneous fat of the RIGHT LOWER abdominal wall overlying the low pelvis. No evidence of underlying abscess. Prominent subcutaneous vein immediately adjacent to the wound which likely accounts for the bleeding. Mild diffuse body wall edema as noted previously. Sarcopenia as noted previously. Musculoskeletal: Osseous demineralization. Severe degenerative disc disease at L4-5 and L5-S1 with a central disc protrusion or extrusion at L4-5. No acute findings. IMPRESSION: 1. Gas-filled wound in the subcutaneous fat of the RIGHT lower abdominal wall overlying the low pelvis. No evidence of underlying abscess. A prominent subcutaneous vein adjacent to the wound likely accounts for the recent bleeding. 2. Gas in the bladder lumen and gas involving the lateral walls of the urinary bladder, query recent instrumentation. The interstitial gas is a new finding since the 01/10/2019 CT and raises the question of cystitis. 3. Stable RIGHT ovarian cysts dating back to 09/03/2018, the largest measuring up to 4.5 cm. 4. Descending and sigmoid colon diverticulosis without evidence of acute diverticulitis. 5. Severe pancreatic atrophy. Aortic Atherosclerosis (ICD10-I70.0). Electronically Signed   By: TEvangeline DakinM.D.   On: 03/07/2019 20:32   UKorea[place]kg Site Rite  Result Date: 03/08/2019 If Site Rite image not attached, placement could not be confirmed due to current cardiac rhythm.    Assessment & Plan:  VKELIS PLASSEis a 68y.o. female with multiple ulcerated lesions on her right abdominal pannus  that have been getting progressively worse and have been present since November 2019. These have failed conservative wound therapy.  They also could be caused by pyoderma gangrenosum  and therapy would include systemic steroids, but at this point we have no diagnosis.  The option of only doing a minimal biopsy is not possible due to active infection and necrosis of the fatty tissue under the ulcers.  I think the best option is wide local debridement with plans to send the specimen to pathology and for fungal/ bacterial culture.  A negative pressure wound vacuum will be placed in order to aid in healing and home health can change this three times a week.   -Discussed the risk with the patient including bleeding, need for blood products given her Hgb, worsening infection, need for further debridement, worsening pyoderma gangrenosum/ ulcers if this is the diagnosis, and longterm wound healing with a wound vacuum.  -Crossed for 2 U pRBC pending need after OR after her bleed last week her Hgb is down to 7 and she did receive some blood a few days ago with minimal improvement, some of this is also chronic anemia related to iron deficiency  -OR for wide debridement of necrotic abdominal ulcers and wound vacuum placement  -Discussed case with Dr. Hilaria Ota, patient is at risk for staying intubated and she understands this risk given her co-morbidities. She is also at risk for cardiac complications and need for ICU care even if she is extubated given her history. Her last ECHO 12/2018 and she had an EF 50% with some diastolic heart failure.   All questions were answered to the satisfaction of the patient.    Virl Cagey 03/09/2019, 11:20 AM

## 2019-03-09 NOTE — Transfer of Care (Signed)
Immediate Anesthesia Transfer of Care Note  Patient: [name]  Procedure(s) Performed: EXCISIONAL DEBRIDEMENT OF ABDOMINAL WOUND ULCERS (N/A )  Patient Location: PACU  Anesthesia Type:General  Level of Consciousness: awake, alert  and patient cooperative  Airway & Oxygen Therapy: Patient Spontanous Breathing and Patient connected to face mask oxygen  Post-op Assessment: Report given to RN, Post -op Vital signs reviewed and stable and Patient moving all extremities  Post vital signs: Reviewed and stable  Last Vitals:  Vitals Value Taken Time  BP    Temp    Pulse 58 03/09/19 1302  Resp    SpO2 99 % 03/09/19 1302  Vitals shown include unvalidated device data.  Last Pain:  Vitals:   03/09/19 1103  TempSrc: Oral  PainSc: 8          Complications: No apparent anesthesia complications

## 2019-03-10 ENCOUNTER — Encounter (HOSPITAL_COMMUNITY): Payer: Self-pay | Admitting: General Surgery

## 2019-03-10 DIAGNOSIS — Z8614 Personal history of Methicillin resistant Staphylococcus aureus infection: Secondary | ICD-10-CM

## 2019-03-10 DIAGNOSIS — S31109D Unspecified open wound of abdominal wall, unspecified quadrant without penetration into peritoneal cavity, subsequent encounter: Secondary | ICD-10-CM

## 2019-03-10 DIAGNOSIS — D508 Other iron deficiency anemias: Secondary | ICD-10-CM

## 2019-03-10 DIAGNOSIS — I5032 Chronic diastolic (congestive) heart failure: Secondary | ICD-10-CM

## 2019-03-10 LAB — CBC WITH DIFFERENTIAL/PLATELET
Abs Immature Granulocytes: 0.04 10*3/uL (ref 0.00–0.07)
Basophils Absolute: 0.1 10*3/uL (ref 0.0–0.1)
Basophils Relative: 1 %
Eosinophils Absolute: 0.5 10*3/uL (ref 0.0–0.5)
Eosinophils Relative: 5 %
HCT: 25.2 % — ABNORMAL LOW (ref 36.0–46.0)
Hemoglobin: 7.3 g/dL — ABNORMAL LOW (ref 12.0–15.0)
Immature Granulocytes: 0 %
Lymphocytes Relative: 15 %
Lymphs Abs: 1.5 10*3/uL (ref 0.7–4.0)
MCH: 27.3 pg (ref 26.0–34.0)
MCHC: 29 g/dL — ABNORMAL LOW (ref 30.0–36.0)
MCV: 94.4 fL (ref 80.0–100.0)
Monocytes Absolute: 1 10*3/uL (ref 0.1–1.0)
Monocytes Relative: 10 %
Neutro Abs: 7 10*3/uL (ref 1.7–7.7)
Neutrophils Relative %: 69 %
Platelets: 346 10*3/uL (ref 150–400)
RBC: 2.67 MIL/uL — ABNORMAL LOW (ref 3.87–5.11)
RDW: 18.6 % — ABNORMAL HIGH (ref 11.5–15.5)
WBC: 10.1 10*3/uL (ref 4.0–10.5)
nRBC: 0 % (ref 0.0–0.2)

## 2019-03-10 LAB — COMPREHENSIVE METABOLIC PANEL
ALT: 8 U/L (ref 0–44)
AST: 9 U/L — ABNORMAL LOW (ref 15–41)
Albumin: 2.3 g/dL — ABNORMAL LOW (ref 3.5–5.0)
Alkaline Phosphatase: 80 U/L (ref 38–126)
Anion gap: 9 (ref 5–15)
BUN: 19 mg/dL (ref 8–23)
CO2: 32 mmol/L (ref 22–32)
Calcium: 7.7 mg/dL — ABNORMAL LOW (ref 8.9–10.3)
Chloride: 96 mmol/L — ABNORMAL LOW (ref 98–111)
Creatinine, Ser: 1.23 mg/dL — ABNORMAL HIGH (ref 0.44–1.00)
GFR calc Af Amer: 52 mL/min — ABNORMAL LOW (ref >60)
GFR calc non Af Amer: 45 mL/min — ABNORMAL LOW (ref >60)
Glucose, Bld: 130 mg/dL — ABNORMAL HIGH (ref 70–99)
Potassium: 3.9 mmol/L (ref 3.5–5.1)
Sodium: 137 mmol/L (ref 135–145)
Total Bilirubin: 0.9 mg/dL (ref 0.3–1.2)
Total Protein: 5.3 g/dL — ABNORMAL LOW (ref 6.5–8.1)

## 2019-03-10 LAB — GLUCOSE, CAPILLARY
Glucose-Capillary: 136 mg/dL — ABNORMAL HIGH (ref 70–99)
Glucose-Capillary: 125 mg/dL — ABNORMAL HIGH (ref 70–99)
Glucose-Capillary: 73 mg/dL (ref 70–99)
Glucose-Capillary: 205 mg/dL — ABNORMAL HIGH (ref 70–99)
Glucose-Capillary: 226 mg/dL — ABNORMAL HIGH (ref 70–99)

## 2019-03-10 LAB — MAGNESIUM: Magnesium: 1.6 mg/dL — ABNORMAL LOW (ref 1.7–2.4)

## 2019-03-10 MED ORDER — MAGNESIUM SULFATE 2 GM/50ML IV SOLN
2.0000 g | Freq: Once | INTRAVENOUS | Status: AC
  Administered 2019-03-10: 2 g via INTRAVENOUS
  Filled 2019-03-10: qty 50

## 2019-03-10 NOTE — Plan of Care (Signed)
  Problem: Education: Goal: Knowledge of General Education information will improve Description Including pain rating scale, medication(s)/side effects and non-pharmacologic comfort measures Outcome: Progressing   Problem: Health Behavior/Discharge Planning: Goal: Ability to manage health-related needs will improve Outcome: Progressing   

## 2019-03-10 NOTE — Progress Notes (Signed)
PROGRESS NOTE    [name]  ENM:[id number]  DOB: October 02, 1950  DOA: 03/07/2019 PCP: [name], MD   Brief Admission Hx: femalewith medical history significant fordiabetes mellitus, hypertension, atrial fibrillation, MRSA infection, diastolic CHF, CKD 3, COPD, depression, who presented to the ED with complaints of chronic abdominal wound with thick foul-smelling drainage.  MDM/Assessment & Plan:   1. 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  Patient is being empirically treated with broad-spectrum antibiotics.  DC vanc today as levels are supratherapeutic and continue zosyn.  Transition to oral antibiotics at discharge.  I spoke with surgery and planning to change wound vac tomorrow 7/24. If patient goes home will need home health RN to change wound vac three times weekly.  2. Acute on chronic anemia-patient has iron deficiency anemia.  Hg holding stable. Follow up with hematologist.  Would 3. Chronic atrial fibrillation-she is on diltiazem and metoprolol for rate control and apixaban for full anticoagulation which is currently being held until wound vac has been changed tomorrow. 4. Chronic diastolic CHF- patient has been resumed on her home Lasix and appears compensated at this time.  Continue to monitor. 5. Type 2 diabetes mellitus- patient is being monitored with a sliding scale coverage.  She had some earlier hypoglycemia and she has been taken off Levemir and glipizide temporarily. 6. Essential hypertension-she has been resumed on her home medications and will follow. 7. COPD- stable and compensated resume home bronchodilators as ordered. 8. Chronic depression-continue Lexapro and bupropion as ordered. 9. 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  DVT prophylaxis: SCDs while apixaban is on hold Code Status: Full Family Communication:  Disposition Plan: Continue inpatient care for IV antibiotics   Consultants:  Surgery  Procedures:  OR for wound excision and debridement 03/09/2019  Antimicrobials:  Vancomycin 03/08/2019-  Zosyn 03/08/2019-  Subjective: Patient says she only has occasional discomfort from surgery wound.  She denies any complaints.    Objective: Vitals:   03/09/19 2010 03/09/19 2128 03/10/19 0532 03/10/19 1307  BP:  (!) 149/51 (!) 130/46 130/68  Pulse:  63 65 (!) 52  Resp:  18 16 16   Temp:  98.5 F (36.9 C) 97.9 F (36.6 C) 99.1 F (37.3 C)  TempSrc:  Oral Oral Oral  SpO2: (!) 76% 98% 94% 98%  Weight:      Height:        Intake/Output Summary (Last 24 hours) at 03/10/2019 1510 Last data filed at 03/10/2019 0500 Gross per 24 hour  Intake 240 ml  Output 1450 ml  Net -1210 ml   Filed Weights   03/07/19 1535 03/07/19 2343  Weight: 93.9 kg 96 kg     REVIEW OF SYSTEMS  As per history otherwise all reviewed and reported negative  Exam:  General exam: awake, alert, NAD, cooperative.   Respiratory system: Clear. No increased work of breathing. Cardiovascular system: S1 & S2 heard. No JVD, murmurs, gallops, clicks or pedal edema. Gastrointestinal system: Abdomen is nondistended, soft and nontender. Normal bowel sounds heard. Central nervous system: Alert and oriented. No focal neurological deficits. Skin: wound vac in place.  Extremities: no CCE.  Data Reviewed: Basic Metabolic Panel: Recent Labs  Lab 03/07/19 1915 03/08/19 0509 03/09/19 0535 03/10/19 0500  NA 135 138 138 137  K 3.7 3.2* 3.7 3.9  CL 101  102 96* 96*  CO2 24 27 30  32  GLUCOSE 237* 58* 178* 130*  BUN 20 17 18 19   CREATININE 0.81 0.80 1.09* 1.23*  CALCIUM 7.8* 8.0* 7.7* 7.7*  MG  --   --  1.2* 1.6*   Liver Function Tests: Recent Labs  Lab 03/10/19 0500  AST 9*  ALT 8  ALKPHOS 80  BILITOT 0.9   PROT 5.3*  ALBUMIN 2.3*   No results for input(s): LIPASE, AMYLASE in the last 168 hours. No results for input(s): AMMONIA in the last 168 hours. CBC: Recent Labs  Lab 03/07/19 1915 03/08/19 0509 03/09/19 0535 03/10/19 0500  WBC 10.5 9.5 9.9 10.1  NEUTROABS 7.9*  --   --  7.0  HGB 6.8* 7.9* 7.1* 7.3*  HCT 23.8* 27.0* 24.5* 25.2*  MCV 96.0 94.1 93.9 94.4  PLT 307 338 326 346   Cardiac Enzymes: No results for input(s): CKTOTAL, CKMB, CKMBINDEX, TROPONINI in the last 168 hours. CBG (last 3)  Recent Labs    03/10/19 0413 03/10/19 0723 03/10/19 1117  GLUCAP 136* 125* 73   Recent Results (from the past 240 hour(s))  Blood culture (routine x 2)     Status: None (Preliminary result)   Collection Time: 03/07/19  7:03 PM   Specimen: BLOOD  Result Value Ref Range Status   Specimen Description BLOOD RIGHT ANTECUBITAL  Final   Special Requests   Final    BOTTLES DRAWN AEROBIC AND ANAEROBIC Blood Culture results may not be optimal due to an inadequate volume of blood received in culture bottles   Culture   Final    NO GROWTH 3 DAYS Performed at [facility], [address] [place], [place] [address]    Report Status PENDING  Incomplete  Wound or Superficial Culture     Status: None (Preliminary result)   Collection Time: 03/07/19  7:10 PM   Specimen: Abdomen; Wound  Result Value Ref Range Status   Specimen Description   Final    ABDOMEN Performed at [facility], [address] [place], [place] [address]    Special Requests   Final    Immunocompromised Performed at [facility], [address] [place], [name] [address]    Gram Stain   Final    NO WBC SEEN ABUNDANT GRAM NEGATIVE RODS ABUNDANT GRAM POSITIVE COCCI IN PAIRS IN CLUSTERS RARE GRAM POSITIVE RODS    Culture   Final    ABUNDANT PROTEUS MIRABILIS SUSCEPTIBILITIES TO FOLLOW Performed at [facility], [place] [address], [place], [place] [address]    Report Status PENDING  Incomplete  Blood culture  (routine x 2)     Status: None (Preliminary result)   Collection Time: 03/07/19  7:18 PM   Specimen: BLOOD  Result Value Ref Range Status   Specimen Description BLOOD LEFT HAND  Final   Special Requests AEROBIC BOTTLE ONLY Blood Culture adequate volume  Final   Culture   Final    NO GROWTH 3 DAYS Performed at [facility], [address], [place],  [address]    Report Status PENDING  Incomplete  SARS Coronavirus 2 (CEPHEID - Performed in [place] hospital lab), Hosp Order     Status: None   Collection Time: 03/07/19  7:19 PM   Specimen: Nasopharyngeal Swab  Result Value Ref Range Status   SARS Coronavirus 2 NEGATIVE NEGATIVE Final    Comment: (NOTE) If result is NEGATIVE SARS-CoV-2 target nucleic acids are NOT DETECTED. The SARS-CoV-2 RNA is generally  detectable in upper and lower  respiratory specimens during the acute phase of infection. The lowest  concentration of SARS-CoV-2 viral copies this assay can detect is 250  copies / mL. A negative result does not preclude SARS-CoV-2 infection  and should not be used as the sole basis for treatment or other  patient management decisions.  A negative result may occur with  improper specimen collection / handling, submission of specimen other  than nasopharyngeal swab, presence of viral mutation(s) within the  areas targeted by this assay, and inadequate number of viral copies  (<250 copies / mL). A negative result must be combined with clinical  observations, patient history, and epidemiological information. If result is POSITIVE SARS-CoV-2 target nucleic acids are DETECTED. The SARS-CoV-2 RNA is generally detectable in upper and lower  respiratory specimens dur ing the acute phase of infection.  Positive  results are indicative of active infection with SARS-CoV-2.  Clinical  correlation with patient history and other diagnostic information is  necessary to determine patient infection status.  Positive results do  not rule  out bacterial infection or co-infection with other viruses. If result is PRESUMPTIVE POSTIVE SARS-CoV-2 nucleic acids MAY BE PRESENT.   A presumptive positive result was obtained on the submitted specimen  and confirmed on repeat testing.  While 2019 novel coronavirus  (SARS-CoV-2) nucleic acids may be present in the submitted sample  additional confirmatory testing may be necessary for epidemiological  and / or clinical management purposes  to differentiate between  SARS-CoV-2 and other Sarbecovirus currently known to infect humans.  If clinically indicated additional testing with an alternate test  methodology [phone]) is advised. The SARS-CoV-2 RNA is generally  detectable in upper and lower respiratory sp ecimens during the acute  phase of infection. The expected result is Negative. Fact Sheet for Patients:  StrictlyIdeas.no Fact Sheet for Healthcare Providers: BankingDealers.co.za This test is not yet approved or cleared by the [place] FDA and has been authorized for detection and/or diagnosis of SARS-CoV-2 by FDA under an Emergency Use Authorization (EUA).  This EUA will remain in effect (meaning this test can be used) for the duration of the COVID-19 declaration under Section 564(b)(1) of the Act, 21 U.S.C. section 360bbb-3(b)(1), unless the authorization is terminated or revoked sooner. Performed at [facility], [address] [name], [place] [address]   Aerobic/Anaerobic Culture (surgical/deep wound)     Status: None (Preliminary result)   Collection Time: 03/09/19 12:24 PM   Specimen: Wound  Result Value Ref Range Status   Specimen Description   Final    WOUND ABDOMEN Performed at [place] [address], [place], [place] [address]    Special Requests   Final    NONE Performed at [facility], [address] [place], [place] [address]    Gram Stain   Final    NO WBC SEEN FEW GRAM VARIABLE ROD RARE  GRAM POSITIVE COCCI Performed at [facility], [place] [address], [place], [place] [address]    Culture MODERATE PROTEUS MIRABILIS  Final   Report Status PENDING  Incomplete  Aerobic Culture (superficial specimen)     Status: None (Preliminary result)   Collection Time: 03/09/19 12:24 PM   Specimen: Tissue  Result Value Ref Range Status   Specimen Description   Final    TISSUE ABDOMEN Performed at [facility], [place] [address] [place], [place] [address]    Special Requests   Final    NONE Performed  at [facility], [address] [place], [place] [address]    Gram Stain   Final    FEW WBC PRESENT, PREDOMINANTLY MONONUCLEAR RARE GRAM VARIABLE ROD    Culture   Final    FEW GRAM NEGATIVE RODS CULTURE REINCUBATED FOR BETTER GROWTH Performed at [facility], [place] [address] [place], [place] [address]    Report Status PENDING  Incomplete     Studies: No results found.   Scheduled Meds: . buPROPion  300 mg Oral Daily  . diltiazem  120 mg Oral Daily  . escitalopram  10 mg Oral Daily  . ferrous sulfate  325 mg Oral BID WC  . furosemide  40 mg Oral BID  . insulin aspart  0-15 Units Subcutaneous Q4H  . metoprolol tartrate  50 mg Oral BID  . sodium chloride flush  10-40 mL Intracatheter Q12H   Continuous Infusions: . piperacillin-tazobactam (ZOSYN)  IV 3.375 g (03/10/19 1415)    Active Problems:   HTN (hypertension)   History of MRSA infection   Chronic diastolic CHF (congestive heart failure) (HCC)   Abdominal wall ulcer, with fat layer exposed (HCC)   Iron deficiency anemia   Abdominal wall skin ulcer ([place])   Chronic wound infection of abdomen   Wound infection   Wheelchair bound   Time spent:   Irwin Brakeman, MD [facility] 03/10/2019, 3:10 PM    LOS: 3 days  How to contact the [facility] Attending or Consulting provider [place] or covering provider during after hours [place], for this patient?  1. Check the care team in [facility] and look for a)  attending/consulting TRH provider listed and b) the [facility] team listed 2. Log into [email or web address] and use [place]'s universal password to access. If you do not have the password, please contact the hospital operator. 3. Locate the [facility] provider you are looking for under [facility] and page to a number that you can be directly reached. 4. If you still have difficulty reaching the provider, please page the [facility] (Director on Call) for the Hospitalists listed on amion for assistance.

## 2019-03-10 NOTE — Anesthesia Postprocedure Evaluation (Signed)
Anesthesia Post Note  Patient: [name]  Procedure(s) Performed: EXCISIONAL DEBRIDEMENT OF ABDOMINAL WOUND ULCERS (N/A ) APPLICATION OF WOUND VAC  Patient location during evaluation: Other Anesthesia Type: General Level of consciousness: awake and alert and oriented Pain management: pain level controlled (Receiving Morphine for pain relief) Vital Signs Assessment: post-procedure vital signs reviewed and stable Respiratory status: spontaneous breathing and patient connected to nasal cannula oxygen Cardiovascular status: stable Postop Assessment: no apparent nausea or vomiting Anesthetic complications: no     Last Vitals:  Vitals:   03/09/19 2128 03/10/19 0532  BP: (!) 149/51 (!) 130/46  Pulse: 63 65  Resp: 18 16  Temp: 36.9 C 36.6 C  SpO2: 98% 94%    Last Pain:  Vitals:   03/10/19 0912  TempSrc:   PainSc: 7                  Carry Weesner A

## 2019-03-10 NOTE — Progress Notes (Signed)
[facility]  No major issues. Pain controlled with meds. Vac with good seal. Will plan to change tomorrow. RN to get supplies to bedside.  Curlene Labrum, MD [facility] [address] [place], [place] [address] [phone] (office)

## 2019-03-10 NOTE — Addendum Note (Signed)
Addendum  created 03/10/19 0950 by Mickel Baas, CRNA   Clinical Note Signed

## 2019-03-10 NOTE — TOC Initial Note (Addendum)
Transition of Care [facility]) - Initial/Assessment Note    Patient Details  Name: [name] MRN: [id number] Date of Birth: November 10, 1950  Transition of [facility]) CM/SW Contact:    Liadan Guizar, Chauncey Reading, RN Phone Number: 03/10/2019, 11:31 AM  Clinical Narrative:                 Patient active with [facility]. Wound Vac placed yesterday in surgery. Discussed options with patient for wound vac, (no list available on CMS website). Referral for wound vac given to [facility] with [employer].    Expected Discharge Plan: [place] Barriers to Discharge: No Barriers Identified   Patient Goals and CMS Choice Patient states their goals for this hospitalization and ongoing recovery are:: return home CMS Medicare.gov Compare Post Acute Care list provided to:: Patient Choice offered to / list presented to : Patient  Expected Discharge Plan and Services Expected Discharge Plan: [place]     Post Acute Care Choice: Home Health, Durable Medical Equipment                   DME Arranged: Negative pressure wound device                    Prior Living Arrangements/Services   Lives with:: Spouse              Current home services: Home RN    Activities of Daily Living Home Assistive Devices/Equipment: CBG Meter, Eyeglasses, Wheelchair, Nebulizer ADL Screening (condition at time of admission) Patient's cognitive ability adequate to safely complete daily activities?: Yes Is the patient deaf or have difficulty hearing?: No Does the patient have difficulty seeing, even when wearing glasses/contacts?: No Does the patient have difficulty concentrating, remembering, or making decisions?: No Patient able to express need for assistance with ADLs?: Yes Does the patient have difficulty dressing or bathing?: No Independently performs ADLs?: No Communication: Independent Dressing (OT): Needs assistance, Independent Grooming: Independent Feeding:  Independent Bathing: Needs assistance Is this a change from baseline?: Pre-admission baseline Toileting: Needs assistance Is this a change from baseline?: Pre-admission baseline In/Out Bed: Needs assistance Is this a change from baseline?: Pre-admission baseline Walks in Home: Needs assistance Is this a change from baseline?: Pre-admission baseline Does the patient have difficulty walking or climbing stairs?: Yes Weakness of Legs: Both Weakness of Arms/Hands: None   Admission diagnosis:  Wound infection [T14.8XXA, L08.9] Patient Active Problem List   Diagnosis Date Noted  . Wound infection 03/09/2019  . Chronic wound infection of abdomen   . Wheelchair bound   . Abdominal wall skin ulcer ([place]) 03/07/2019  . Acute pulmonary edema ([place]) 02/17/2019  . Iron deficiency anemia 02/01/2019  . Abdominal wall ulcer, with fat layer exposed ([place]) 01/16/2019  . Elevated troponin   . Nausea and vomiting   . Symptomatic bradycardia 01/10/2019  . DKA (diabetic ketoacidoses) ([place]) 01/10/2019  . Urinary incontinence 12/28/2018  . Recurrent urinary tract infection 12/16/2018  . Wound, open, anterior abdominal wall 12/05/2018  . Hypotension due to hypovolemia   . Gastritis due to nonsteroidal anti-inflammatory drug   . Esophageal dysphagia   . GI bleed 09/03/2018  . Coagulopathy ([place])   . Colitis   . Lower abdominal pain   . Rectal bleeding   . Dehydration 08/16/2018  . Acute on chronic diastolic CHF (congestive heart failure) ([place]) 08/10/2018  . Acute lower UTI 08/10/2018  . Hypokalemia 08/10/2018  . COPD (chronic obstructive pulmonary disease) (  [place]) 08/10/2018  . Thyroid lesion 08/10/2018  . Closed fracture of right ankle 06/10/2018  . Closed fracture of left tibia and fibula with routine healing 04/13/18 06/10/2018  . Atrial fibrillation ([place]) 04/15/2018  . Chronic diastolic CHF (congestive heart failure) ([place]) 04/15/2018  . CKD (chronic kidney disease), stage III ([place]) 04/15/2018  .  Closed right fibular fracture 04/15/2018  . Left tibial fracture 04/14/2018  . CHF exacerbation ([place]) 04/10/2018  . SOB (shortness of breath)   . Acute CHF (congestive heart failure) ([place]) 04/01/2018  . Atrial fibrillation with RVR ([place]) 04/01/2018  . [place] 03/11/2016  . Muscle spasms of lower extremity 03/04/2016  . Acute renal insufficiency 02/26/2016  . History of MRSA infection 02/26/2016  . HTN (hypertension) 02/19/2016  . Fracture, femur, distal ([place]) 02/19/2016  . Fall 02/19/2016  . Depression 02/19/2016  . Closed left femoral fracture, initial encounter 02/19/2016  . Femur fracture, left ([place]) 02/19/2016   PCP:  [name], MD Pharmacy:   [facility] [address] [place] [address] [place] [place] [address] Phone: [phone] Fax: [phone]  [id number] [place]Phone: [phone]: [phone]    Social Determinants of Health (SDOH) Interventions    Readmission Risk Interventions Readmission Risk Prevention Plan 03/10/2019 02/19/2019 01/14/2019  Transportation Screening Complete Complete Complete  Medication Review (Press photographer - Complete Complete  PCP or Specialist appointment within 3-5 days of discharge - Not Complete -  [place] Complete Complete  SW Recovery Care/Counseling Consult Complete Patient refused Complete  Palliative Care Screening Not Applicable Not Applicable Not [place] Hammock[place] Applicable Not Applicable Not Applicable  Some recent data might be hidden

## 2019-03-11 ENCOUNTER — Other Ambulatory Visit (HOSPITAL_COMMUNITY): Payer: Self-pay | Admitting: *Deleted

## 2019-03-11 ENCOUNTER — Ambulatory Visit: Admit: 2019-03-11 | Payer: BC Managed Care – PPO | Admitting: General Surgery

## 2019-03-11 DIAGNOSIS — L88 Pyoderma gangrenosum: Principal | ICD-10-CM

## 2019-03-11 DIAGNOSIS — D508 Other iron deficiency anemias: Secondary | ICD-10-CM

## 2019-03-11 DIAGNOSIS — T148XXA Other injury of unspecified body region, initial encounter: Secondary | ICD-10-CM

## 2019-03-11 DIAGNOSIS — Z993 Dependence on wheelchair: Secondary | ICD-10-CM

## 2019-03-11 DIAGNOSIS — D649 Anemia, unspecified: Secondary | ICD-10-CM

## 2019-03-11 LAB — TYPE AND SCREEN
ABO/RH(D): A POS
Antibody Screen: NEGATIVE
Unit division: 0
Unit division: 0
Unit division: 0

## 2019-03-11 LAB — BPAM RBC
Blood Product Expiration Date: 202008142359
Blood Product Expiration Date: 202008142359
Blood Product Expiration Date: 202008142359
ISSUE DATE / TIME: 202007210138
Unit Type and Rh: 6200
Unit Type and Rh: 6200
Unit Type and Rh: 6200

## 2019-03-11 LAB — AEROBIC CULTURE W GRAM STAIN (SUPERFICIAL SPECIMEN): Gram Stain: NONE SEEN

## 2019-03-11 LAB — CBC
HCT: 26 % — ABNORMAL LOW (ref 36.0–46.0)
Hemoglobin: 7.6 g/dL — ABNORMAL LOW (ref 12.0–15.0)
MCH: 27.4 pg (ref 26.0–34.0)
MCHC: 29.2 g/dL — ABNORMAL LOW (ref 30.0–36.0)
MCV: 93.9 fL (ref 80.0–100.0)
Platelets: 345 10*3/uL (ref 150–400)
RBC: 2.77 MIL/uL — ABNORMAL LOW (ref 3.87–5.11)
RDW: 17.6 % — ABNORMAL HIGH (ref 11.5–15.5)
WBC: 9.3 10*3/uL (ref 4.0–10.5)
nRBC: 0 % (ref 0.0–0.2)

## 2019-03-11 LAB — GLUCOSE, CAPILLARY
Glucose-Capillary: 160 mg/dL — ABNORMAL HIGH (ref 70–99)
Glucose-Capillary: 185 mg/dL — ABNORMAL HIGH (ref 70–99)
Glucose-Capillary: 223 mg/dL — ABNORMAL HIGH (ref 70–99)
Glucose-Capillary: 261 mg/dL — ABNORMAL HIGH (ref 70–99)

## 2019-03-11 LAB — CREATININE, SERUM
Creatinine, Ser: 1.29 mg/dL — ABNORMAL HIGH (ref 0.44–1.00)
GFR calc Af Amer: 49 mL/min — ABNORMAL LOW (ref >60)
GFR calc non Af Amer: 43 mL/min — ABNORMAL LOW (ref >60)

## 2019-03-11 SURGERY — MINOR EXCISION LIPOMA
Anesthesia: LOCAL

## 2019-03-11 MED ORDER — PREDNISONE 20 MG PO TABS
60.0000 mg | ORAL_TABLET | Freq: Every day | ORAL | Status: DC
  Administered 2019-03-11: 60 mg via ORAL
  Filled 2019-03-11: qty 3

## 2019-03-11 MED ORDER — AMOXICILLIN-POT CLAVULANATE 875-125 MG PO TABS: 1.0000 | ORAL_TABLET | Freq: Two times a day (BID) | ORAL | 0 refills | Status: AC

## 2019-03-11 MED ORDER — PREDNISONE 20 MG PO TABS: 60.0000 mg | ORAL_TABLET | Freq: Every day | ORAL | 0 refills | Status: AC

## 2019-03-11 MED ORDER — SACCHAROMYCES BOULARDII 250 MG PO CAPS: 250.0000 mg | ORAL_CAPSULE | Freq: Two times a day (BID) | ORAL | 0 refills | Status: AC

## 2019-03-11 MED ORDER — OXYCODONE HCL 5 MG PO TABS: 5.0000 mg | ORAL_TABLET | Freq: Four times a day (QID) | ORAL | 0 refills | Status: DC | PRN

## 2019-03-11 MED ORDER — INSULIN ASPART 100 UNIT/ML ~~LOC~~ SOLN
3.0000 [IU] | Freq: Three times a day (TID) | SUBCUTANEOUS | Status: DC
  Administered 2019-03-11 (×2): 3 [IU] via SUBCUTANEOUS

## 2019-03-11 MED ORDER — INSULIN DETEMIR 100 UNIT/ML ~~LOC~~ SOLN
10.0000 [IU] | Freq: Two times a day (BID) | SUBCUTANEOUS | Status: DC
  Administered 2019-03-11: 10 [IU] via SUBCUTANEOUS
  Filled 2019-03-11 (×7): qty 0.1

## 2019-03-11 MED ORDER — FUROSEMIDE 20 MG PO TABS: 40.0000 mg | ORAL_TABLET | Freq: Two times a day (BID) | ORAL | 0 refills | Status: DC

## 2019-03-11 NOTE — Plan of Care (Signed)

## 2019-03-11 NOTE — Discharge Summary (Addendum)
Physician Discharge Summary  [name] SJG:[id number] DOB: Nov 06, 1950 DOA: 03/07/2019  PCP: [name], MD Surgeon: Dr. Blake Divine Cardiologist: Dr. Bronson Ing Dermatologist: Establishing care with Dr. Sydnee Levans 03/15/19  Admit date: 03/07/2019 Discharge date: 03/11/2019  Admitted From: Home  Disposition: Home with [place]  Recommendations for Outpatient Follow-up:  1. Follow up with PCP in 1 weeks 2. Please follow up with surgery on 8/4 as scheduled. 3. Establish care with dermatology for follow up/management of newly diagnosed pyogenic gangrenosum.  4. Please monitor blood sugars closely while on steroids and adjust insulin as needed for better glycemic control.  5. Dr. Constance Haw with surgery is managing wound vac and working closely with [place]. See surgery progress note below.   Home Health: RN, to change wound vac three times weekly per surgery  Discharge Condition: STABLE   CODE STATUS: FULL    Brief Hospitalization Summary: Please see all hospital notes, images, labs for full details of the hospitalization. Dr. Talmadge Coventry HPI: CHARENE MCCALLISTER is a 68 y.o. female with medical history significant for diabetes mellitus, hypertension, atrial fibrillation, MRSA infection, diastolic CHF, CKD 3, COPD, depression, who presented to the ED with complaints of chronic abdominal wound with thick foul-smelling drainage.  Patient's wounds have been present since Thanksgiving.  She had 3 wounds, 1 of them has progressed significantly in size. She has mild to moderate intermittent pain from her wounds. She noticed foul-smelling drainage after recent hospitalization.  She denies fever or chills.  She denies any trauma to her abdomen that started this wounds.  She denies pain with urination, but reports frequency from diuretics.  She denies difficulty breathing or cough, no chest pain, she has mild chronic lower extremity swelling that is unchanged.  She  has been compliant with her medications including Lasix and Eliquis.  Her last dose of Eliquis was midday yesterday.  Patient said she did not take any subsequent doses as she felt to be seeking evaluation for her wounds and may need a procedure.  Recent Hospitalization 7/2 to 7/4-managed for acute pulmonary edema secondary to decompensated diastolic CHF, with atrial fibrillation with RVR.  Patient was diuresed and started on nitroglycerin infusion, thought secondary to Feraheme infusion.  Patient was to follow-up with Dr. Constance Haw as outpatient for biopsy of abdominal wounds. 5/25 to 5/31 also for decompensated colic CHF.  ED Course: Temperature 98.1.  Heart rate 59-61, blood pressure 130s to 160s.  WBC 10.5.  With low hemoglobin 6.8.  Abdominal and pelvic CT with contrast-gas-filled wound in the subcutaneous fat of the right lower abdominal wall overlying the low pelvis no evidence of underlying abscess (please see detailed report).  Patient was started on IV vancomycin. EDP talked to general surgery on-call Dr. Arnoldo Morale.  Brief Admission Hx: femalewith medical history significant fordiabetes mellitus, hypertension, atrial fibrillation, MRSA infection, diastolic CHF, CKD 3, COPD, depression, who presented to the ED with complaints of chronic abdominal wound with thick foul-smelling drainage.  MDM/Assessment & Plan:   1. 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  Patient was empirically treated with broad-spectrum antibiotics and now has been started on steroids for treatment of late stage pyogenic gangrenosum. Pt has home wound vac and home health RN to change wound vac three times weekly and Dr. Constance Haw will follow closely outpatient.  5 more days of augmentin  prescribed / oral oxycodone tablets  prescribed #25 tabs for pain management, patient advised to take probiotics for 30 days.  2. 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  Pt has been started on prednisone 60 mg daily.  It would need to be tapered down in 4-10 weeks according to literature as the disease process improves.  I have called and gotten patient an appointment with [facility] Dr. Geoffery Lyons on 03/15/19 at 2:20 pm to help with the management and follow up of this rare condition.  3. Acute on chronic anemia-patient has iron deficiency anemia.  Hg holding stable. Follow up with outpatient hematologist.   4. Chronic atrial fibrillation-she is on diltiazem and metoprolol for rate control and apixaban for full anticoagulation which was held until wound vac changed.  I spoke with Dr. Constance Haw and apixaban can be restarted and bleeding will be monitored closely.   5. Chronic diastolic CHF- patient has been resumed on Lasix and appears compensated at this time.  Follow up with Dr. Bronson Ing. 6. Type 2 diabetes mellitus- patient was monitored with a sliding scale coverage. Now that she is on steroids, she was encouraged to monitor blood sugars closely and report to PCP if blood sugars are over 200 and could increase her levemir insulin as recommended.  Pt has PCP follow up arranged for 03/22/19.   7. Essential hypertension-she has been resumed on her home medications and has been stable. 8. COPD- stable and compensated resume home bronchodilators as ordered. 9. Chronic depression-continue Lexapro and bupropion as ordered. 10. 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    DVT prophylaxis: SCDs Code Status: Full Family Communication:  Disposition Plan: Home with Home Health services and close outpatient follow up.    Consultants:  Surgery  Surgery Progress Note 03/11/19 [facility]  Patient did well with wound vac change. Changed to home vac. Some minor bleeding  but nothing major. 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 Based on my reading the treatment would be systemic steroids for at least 4 weeks with a taper.  Given the possibility of the pathology and the possibility that surgery could make this process worse, I think we treat with steroids to be safe. The steroids will increased the healing time, but this will have to be acceptable to hopefully prevent any worsening of pyoderma gangrenosum.   Steroids for 4 weeks. PCP/ Derm to manage steroids.  Wound vacuum to abdomen with black sponge to abdomen, 125 mm Hg pressure. Needs to be changed three times a week.  Home Health Arranged.  If the wound bleeds, hold pressure on the bleeding for 10 minutes, if does not resolve or large amount (soaking multiple pads) then should go to the ED.   If wound vacuum does not seal (Leaks), then check the cone for a leak. If the upside down cone is black, there is a leak. Apply tape over the wound to see if this seals the leak. Call your home health RN to ask for further assistance.   If leak is unable to be fixed, remove the wound vacuum (tape and sponge) and place a saline dampened kerlix (large gauze) and ABD pad and paper tape until a RN can replace the vacuum.   I will see the patient in person 03/24/19. My office will call her with an appointment. I will talk with her home health RN about me changing the vacuum that day.  Patient needs to bring vacuum supplies with her to the clinic.  Curlene Labrum, MD [facility] [address], [place] [address] [phone] (office)   Procedures:  OR for wound excision and debridement 03/09/2019  SURGICAL PATHOLOGY REPORT Diagnosis Soft tissue, biopsy, abdominal wound ULCER AND SCAR WITH FAT NECROSIS AND SUBCUTANEOUS CALCIFICATIONS, SEE DESCRIPTION Microscopic Comment This specimen demonstrates surface ulceration with underlying mixed  inflammation and crust. Within the dermis there is dense fibrosis and scar-like changes. Many of these sections demonstrate fibroadipose subcutaneous tissue with areas of fat necrosis and mixed inflammation including many lipophages. Subcutaneous septae are thickened with fibrosis and mixed inflammation. Numerous telangiectatic blood vessels are noted. Within one area of the thickened fibrous septae, there are significant subcutaneous calcifications which appear associated with small to medium sized blood vessels. However, definitive features of calciphylaxis are not identified. CD31 stain helps to highlight the vascular areas within the vicinity of the microcalcifications, but this stain does not demonstrate significant staining around the microcalcifications. PAS stain is negative for fungal hyphae. A Fite stain is negative for mycobacteria. Gram stain is negative for significant bacterial colonies. Multiple sections have been examined. There are also some features of vasculitis; however, given the degree of inflammation in these areas this finding is likely secondary to a chronic inflammatory pattern. It is possible that the findings in this specimen represent the late stage of pyoderma gangrenosum. Clinicopathologic correlation is recommended. (DJT:kh 03/10/2019) Rivka Safer MD Dermatopathologist, Electronic Signature (Case signed 03/11/2019)  Antimicrobials:  Vancomycin 03/08/2019-  Zosyn 03/08/2019-  Discharge Diagnoses:  Active Problems:   Pyoderma gangrenosum   HTN (hypertension)   History of MRSA infection   Chronic diastolic CHF (congestive heart failure) (HCC)   Abdominal wall ulcer, with fat layer exposed (HCC)   Iron deficiency anemia   Abdominal wall skin ulcer (HCC)   Chronic wound infection of abdomen   Wound infection   Wheelchair bound   Discharge Instructions: Discharge Instructions    Call MD for:  difficulty breathing, headache or visual disturbances    Complete by: As directed    Call MD for:  extreme fatigue   Complete by: As directed    Call MD for:  persistant dizziness or light-headedness   Complete by: As directed    Call MD for:  persistant nausea and vomiting   Complete by: As directed    Call MD for:  redness, tenderness, or signs of infection (pain, swelling, redness, odor or green/yellow discharge around incision site)   Complete by: As directed    Call MD for:  severe uncontrolled pain   Complete by: As directed    Increase activity slowly   Complete by: As directed      Allergies as of 03/11/2019      Reactions   Feraheme [ferumoxytol] Other (See Comments)   Back pain (yelling out with back pain)   Propranolol Swelling   Pt states it may have been leg swelling   Topamax [topiramate] Other (See Comments)   hallucinations   Latex Itching, Rash      Medication List    TAKE these medications   acetaminophen 500 MG tablet Commonly known as: TYLENOL Take 500-1,000 mg by mouth every 8 (eight) hours as needed for mild pain or headache.   albuterol 108 (90 Base) MCG/ACT inhaler Commonly known as: VENTOLIN HFA Inhale 2 puffs into the lungs every 6 (six) hours as needed for wheezing or shortness of breath. What changed: reasons to take this   amoxicillin-clavulanate 875-125 MG tablet Commonly known as: Augmentin Take 1  tablet by mouth 2 (two) times daily for 5 days.   apixaban 5 MG Tabs tablet Commonly known as: ELIQUIS Take 1 tablet (5 mg total) by mouth 2 (two) times daily.   buPROPion 300 MG 24 hr tablet Commonly known as: WELLBUTRIN XL Take 300 mg by mouth daily.   diltiazem 120 MG 24 hr capsule Commonly known as: CARDIZEM CD Take 1 capsule (120 mg total) by mouth daily. What changed: additional instructions   escitalopram 10 MG tablet Commonly known as: LEXAPRO Take 10 mg by mouth daily.   ferrous sulfate 325 (65 FE) MG tablet Commonly known as: FerrouSul Take 1 tablet (325 mg total) by mouth 2  (two) times daily with a meal.   fluticasone 50 MCG/ACT nasal spray Commonly known as: FLONASE Place 2 sprays into both nostrils daily as needed for allergies or rhinitis (congestion).   furosemide 20 MG tablet Commonly known as: LASIX Take 2 tablets (40 mg total) by mouth 2 (two) times daily. What changed: how much to take   glipiZIDE 10 MG tablet Commonly known as: GLUCOTROL Take 10 mg by mouth 2 (two) times daily.   levalbuterol 0.63 MG/3ML nebulizer solution Commonly known as: XOPENEX Take 0.63 mg by nebulization every 6 (six) hours as needed for wheezing or shortness of breath.   Levemir FlexTouch 100 UNIT/ML Pen Generic drug: Insulin Detemir Inject 10 Units into the skin 2 (two) times a day.   metoprolol tartrate 50 MG tablet Commonly known as: LOPRESSOR Take 50 mg by mouth 2 (two) times daily.   nystatin powder Commonly known as: MYCOSTATIN/NYSTOP Apply topically daily as needed (rash in skin folds).   OVER THE COUNTER MEDICATION Apply 1 application topically daily as needed (rash in skin folds). Baby butt paste   oxyCODONE 5 MG immediate release tablet Commonly known as: Oxy IR/ROXICODONE Take 1 tablet (5 mg total) by mouth every 6 (six) hours as needed for severe pain.   pantoprazole 40 MG tablet Commonly known as: PROTONIX Take 1 tablet (40 mg total) by mouth daily before breakfast. What changed:   when to take this  reasons to take this   predniSONE 20 MG tablet Commonly known as: DELTASONE Take 3 tablets (60 mg total) by mouth daily with breakfast. Start taking on: March 12, 2019   saccharomyces boulardii 250 MG capsule Commonly known as: Florastor Take 1 capsule (250 mg total) by mouth 2 (two) times daily.      Follow-up Information    [name], MD. Go on 03/22/2019.   Specialty: Internal Medicine Why: Hospital Follow Up appointment. Arrive by 9:15 for a 9:30 appointment. Contact information: [address] [place] [address] [phone]        Herminio Commons, MD .   Specialty: Cardiology Contact information: [place] [address] [phone]        Virl Cagey, MD Follow up on 03/24/2019.   Specialty: General Surgery Why: Bring Wound Vacuum supplies for me to change the wound (Ask your RN to show you what to bring). Dr. Constance Haw' staff will call you to confirm time of the appointment. Contact information: [address] [place] [address] [phone]        Sydnee Levans, MD. Go on 03/15/2019.   Specialty: Dermatology Why: Please go to appointment at 2:20 pm for evaluation of pyogenic gangrenosum.  Contact information: [place] [address] [phone]          Allergies  Allergen Reactions  . Feraheme [Ferumoxytol] Other (See Comments)    Back pain (yelling out with back pain)  . Propranolol Swelling    Pt states it may have been leg swelling  . Topamax [Topiramate] Other (See Comments)    hallucinations  . Latex Itching and Rash   Allergies as of 03/11/2019      Reactions   Feraheme [ferumoxytol] Other (See Comments)   Back pain (yelling out with back pain)   Propranolol Swelling   Pt states it may have been leg swelling   Topamax [topiramate] Other (See Comments)   hallucinations   Latex Itching, Rash      Medication List    TAKE these medications   acetaminophen 500 MG tablet Commonly known as: TYLENOL Take 500-1,000 mg by mouth every 8 (eight) hours as needed for mild pain or headache.   albuterol 108 (90 Base) MCG/ACT inhaler Commonly known as: VENTOLIN HFA Inhale 2 puffs into the lungs every 6 (six) hours as needed for wheezing or shortness of breath. What changed: reasons to take this   amoxicillin-clavulanate 875-125 MG tablet Commonly known as: Augmentin Take 1 tablet by mouth 2 (two) times daily for 5 days.   apixaban 5 MG Tabs tablet Commonly known as: ELIQUIS Take 1  tablet (5 mg total) by mouth 2 (two) times daily.   buPROPion 300 MG 24 hr tablet Commonly known as: WELLBUTRIN XL Take 300 mg by mouth daily.   diltiazem 120 MG 24 hr capsule Commonly known as: CARDIZEM CD Take 1 capsule (120 mg total) by mouth daily. What changed: additional instructions   escitalopram 10 MG tablet Commonly known as: LEXAPRO Take 10 mg by mouth daily.   ferrous sulfate 325 (65 FE) MG tablet Commonly known as: FerrouSul Take 1 tablet (325 mg total) by mouth 2 (two) times daily with a meal.   fluticasone 50 MCG/ACT nasal spray Commonly known as: FLONASE Place 2 sprays into both nostrils daily as needed for allergies or rhinitis (congestion).   furosemide 20 MG tablet Commonly known as: LASIX Take 2 tablets (40 mg total) by mouth 2 (two) times daily. What changed: how much to take   glipiZIDE 10 MG tablet Commonly known as: GLUCOTROL Take 10 mg by mouth 2 (two) times daily.   levalbuterol 0.63 MG/3ML nebulizer solution Commonly known as: XOPENEX Take 0.63 mg by nebulization every 6 (six) hours as needed for wheezing or shortness of breath.   Levemir FlexTouch 100 UNIT/ML Pen Generic drug: Insulin Detemir Inject 10 Units into the skin 2 (two) times a day.   metoprolol tartrate 50 MG tablet Commonly known as: LOPRESSOR Take 50 mg by mouth 2 (two) times daily.   nystatin powder Commonly known as: MYCOSTATIN/NYSTOP Apply topically daily as needed (rash in skin folds).   OVER THE COUNTER MEDICATION Apply 1 application topically daily as needed (rash in skin folds). Baby butt paste   oxyCODONE 5 MG immediate release tablet Commonly known as: Oxy IR/ROXICODONE Take 1 tablet (5 mg total) by mouth every 6 (six) hours as needed for severe pain.   pantoprazole 40 MG tablet Commonly known as: PROTONIX Take 1 tablet (40 mg total) by mouth daily before breakfast. What changed:   when to take this  reasons to take this   predniSONE 20 MG  tablet Commonly known as: DELTASONE Take 3 tablets (60 mg total) by mouth daily with breakfast. Start taking on: March 12, 2019   saccharomyces boulardii 250 MG capsule Commonly known as:  Florastor Take 1 capsule (250 mg total) by mouth 2 (two) times daily.       Procedures/Studies: Ct Abdomen Pelvis W Contrast  Result Date: 03/07/2019 CLINICAL DATA:  68 year old with a wound involving the skin of the RIGHT lower abdominal wall overlying the low pelvis, presenting now with bleeding and a foul over emanating from the wound. Surgical history includes hysterectomy, cholecystectomy and remote pancreatic cyst drainage. EXAM: CT ABDOMEN AND PELVIS WITH CONTRAST TECHNIQUE: Multidetector CT imaging of the abdomen and pelvis was performed using the standard protocol following bolus administration of intravenous contrast. CONTRAST:  154m OMNIPAQUE IOHEXOL 300 MG/ML IV. COMPARISON:  01/10/2019 and earlier. FINDINGS: Lower chest: Minimal scar and bronchiectasis involving the lower lobes. Visualized lung bases otherwise clear. Heart moderately enlarged with dense mitral annular calcification and mild RIGHT coronary and LEFT circumflex coronary artery calcification. Hepatobiliary: Liver normal in size and appearance. Surgically absent gallbladder. No unexpected biliary ductal dilation. Pancreas: Severely atrophic. No pancreatic mass or peripancreatic inflammation. Spleen: Calcified granuloma in the LOWER pole. No significant abnormalities. Adrenals/Urinary Tract: Normal appearing adrenal glands. Scarring involving both kidneys. No masses involving either kidney. No hydronephrosis. No urinary tract calculi. Gas in the bladder lumen and gas involving the lateral walls of the urinary bladder; the interstitial gas is a new finding since the prior CT. Stomach/Bowel: Stomach normal in appearance for the degree of distention. Normal-appearing small bowel. Moderate colonic stool burden. Descending and sigmoid colon  diverticulosis without evidence of acute diverticulitis. Appendix not conspicuous, but no pericecal inflammation. Vascular/Lymphatic: Moderate to severe aortoiliofemoral atherosclerosis without evidence of aneurysm. No pathologic lymphadenopathy. Reproductive: RIGHT ovarian cysts which are stable dating back to 09/03/2018, the largest measuring approximately 3.6 x 4.5 cm. Surgically absent uterus. No LEFT adnexal masses. Other: Gas-filled wound in the subcutaneous fat of the RIGHT LOWER abdominal wall overlying the low pelvis. No evidence of underlying abscess. Prominent subcutaneous vein immediately adjacent to the wound which likely accounts for the bleeding. Mild diffuse body wall edema as noted previously. Sarcopenia as noted previously. Musculoskeletal: Osseous demineralization. Severe degenerative disc disease at L4-5 and L5-S1 with a central disc protrusion or extrusion at L4-5. No acute findings. IMPRESSION: 1. Gas-filled wound in the subcutaneous fat of the RIGHT lower abdominal wall overlying the low pelvis. No evidence of underlying abscess. A prominent subcutaneous vein adjacent to the wound likely accounts for the recent bleeding. 2. Gas in the bladder lumen and gas involving the lateral walls of the urinary bladder, query recent instrumentation. The interstitial gas is a new finding since the 01/10/2019 CT and raises the question of cystitis. 3. Stable RIGHT ovarian cysts dating back to 09/03/2018, the largest measuring up to 4.5 cm. 4. Descending and sigmoid colon diverticulosis without evidence of acute diverticulitis. 5. Severe pancreatic atrophy. Aortic Atherosclerosis (ICD10-I70.0). Electronically Signed   By: TEvangeline DakinM.D.   On: 03/07/2019 20:32   Dg Chest Port 1 View  Result Date: 02/17/2019 CLINICAL DATA:  Shortness of breath. EXAM: PORTABLE CHEST 1 VIEW COMPARISON:  Radiograph Jan 10, 2019. FINDINGS: Stable cardiomediastinal silhouette. Increased central pulmonary vascular  congestion is noted with possible bilateral pulmonary edema. No pneumothorax or pleural effusion is noted. Bony thorax is unremarkable. IMPRESSION: Increased central pulmonary vascular congestion is noted with possible bilateral pulmonary edema. Electronically Signed   By: JMarijo ConceptionM.D.   On: 02/17/2019 15:49   [place] Site Rite  Result Date: 03/08/2019 If Site Rite image not attached, placement could not be confirmed due to  current cardiac rhythm.    Subjective:   Discharge Exam: Vitals:   03/10/19 2141 03/11/19 0543  BP: (!) 144/42 (!) 137/58  Pulse: (!) 51 (!) 51  Resp: 18 16  Temp: 98.4 F (36.9 C) 98.4 F (36.9 C)  SpO2: 100% 100%   Vitals:   03/10/19 1307 03/10/19 1928 03/10/19 2141 03/11/19 0543  BP: 130/68  (!) 144/42 (!) 137/58  Pulse: (!) 52  (!) 51 (!) 51  Resp: 16  18 16   Temp: 99.1 F (37.3 C)  98.4 F (36.9 C) 98.4 F (36.9 C)  TempSrc: Oral  Oral Oral  SpO2: 98% (!) 86% 100% 100%  Weight:      Height:        General: Pt is alert, awake, not in acute distress Cardiovascular: RRR, S1/S2 +, no rubs, no gallops Respiratory: CTA bilaterally, no wheezing, no rhonchi Abdominal: Soft, NT, ND, bowel sounds + Extremities: no edema, no cyanosis   The results of significant diagnostics from this hospitalization (including imaging, microbiology, ancillary and laboratory) are listed below for reference.     Microbiology: Recent Results (from the past 240 hour(s))  Blood culture (routine x 2)     Status: None (Preliminary result)   Collection Time: 03/07/19  7:03 PM   Specimen: BLOOD  Result Value Ref Range Status   Specimen Description BLOOD RIGHT ANTECUBITAL  Final   Special Requests   Final    BOTTLES DRAWN AEROBIC AND ANAEROBIC Blood Culture results may not be optimal due to an inadequate volume of blood received in culture bottles   Culture   Final    NO GROWTH 4 DAYS Performed at [facility], [address], [place], [place] [address]    Report  Status PENDING  Incomplete  Wound or Superficial Culture     Status: None (Preliminary result)   Collection Time: 03/07/19  7:10 PM   Specimen: Abdomen; Wound  Result Value Ref Range Status   Specimen Description   Final    ABDOMEN Performed at [facility], [address] [place], [place] [address]    Special Requests   Final    Immunocompromised Performed at [facility], [address], [place], [place] [address]    Gram Stain   Final    NO WBC SEEN ABUNDANT GRAM NEGATIVE RODS ABUNDANT GRAM POSITIVE COCCI IN PAIRS IN CLUSTERS RARE GRAM POSITIVE RODS    Culture   Final    ABUNDANT PROTEUS MIRABILIS SUSCEPTIBILITIES TO FOLLOW Performed at [facility], [place] [address] [place], [place] [address]    Report Status PENDING  Incomplete  Blood culture (routine x 2)     Status: None (Preliminary result)   Collection Time: 03/07/19  7:18 PM   Specimen: BLOOD  Result Value Ref Range Status   Specimen Description BLOOD LEFT HAND  Final   Special Requests AEROBIC BOTTLE ONLY Blood Culture adequate volume  Final   Culture   Final    NO GROWTH 4 DAYS Performed at [facility], [address], [place], [place] [address]    Report Status PENDING  Incomplete  SARS Coronavirus 2 (CEPHEID - Performed in [place] hospital lab), Hosp Order     Status: None   Collection Time: 03/07/19  7:19 PM   Specimen: Nasopharyngeal Swab  Result Value Ref Range Status   SARS Coronavirus 2 NEGATIVE NEGATIVE Final    Comment: (NOTE) If result is NEGATIVE SARS-CoV-2 target nucleic acids are NOT DETECTED. The SARS-CoV-2 RNA is  generally detectable in upper and lower  respiratory specimens during the acute phase of infection. The lowest  concentration of SARS-CoV-2 viral copies this assay can detect is 250  copies / mL. A negative result does not preclude SARS-CoV-2 infection  and should not be used as the sole basis for treatment or other  patient management decisions.  A negative result may occur  with  improper specimen collection / handling, submission of specimen other  than nasopharyngeal swab, presence of viral mutation(s) within the  areas targeted by this assay, and inadequate number of viral copies  (<250 copies / mL). A negative result must be combined with clinical  observations, patient history, and epidemiological information. If result is POSITIVE SARS-CoV-2 target nucleic acids are DETECTED. The SARS-CoV-2 RNA is generally detectable in upper and lower  respiratory specimens dur ing the acute phase of infection.  Positive  results are indicative of active infection with SARS-CoV-2.  Clinical  correlation with patient history and other diagnostic information is  necessary to determine patient infection status.  Positive results do  not rule out bacterial infection or co-infection with other viruses. If result is PRESUMPTIVE POSTIVE SARS-CoV-2 nucleic acids MAY BE PRESENT.   A presumptive positive result was obtained on the submitted specimen  and confirmed on repeat testing.  While 2019 novel coronavirus  (SARS-CoV-2) nucleic acids may be present in the submitted sample  additional confirmatory testing may be necessary for epidemiological  and / or clinical management purposes  to differentiate between  SARS-CoV-2 and other Sarbecovirus currently known to infect humans.  If clinically indicated additional testing with an alternate test  methodology [phone]) is advised. The SARS-CoV-2 RNA is generally  detectable in upper and lower respiratory sp ecimens during the acute  phase of infection. The expected result is Negative. Fact Sheet for Patients:  StrictlyIdeas.no Fact Sheet for Healthcare Providers: BankingDealers.co.za This test is not yet approved or cleared by the [place] FDA and has been authorized for detection and/or diagnosis of SARS-CoV-2 by FDA under an Emergency Use Authorization (EUA).  This EUA  will remain in effect (meaning this test can be used) for the duration of the COVID-19 declaration under Section 564(b)(1) of the Act, 21 U.S.C. section 360bbb-3(b)(1), unless the authorization is terminated or revoked sooner. Performed at [facility], [address], [place], [place] [address]   Aerobic/Anaerobic Culture (surgical/deep wound)     Status: None (Preliminary result)   Collection Time: 03/09/19 12:24 PM   Specimen: Wound  Result Value Ref Range Status   Specimen Description   Final    WOUND ABDOMEN Performed at [place] [address], [place], [place] [address]    Special Requests   Final    NONE Performed at [facility], [address] [place], [place] [address]    Gram Stain   Final    NO WBC SEEN FEW GRAM VARIABLE ROD RARE GRAM POSITIVE COCCI Performed at [facility], [place] [address] [place], [place] [address]    Culture MODERATE PROTEUS MIRABILIS  Final   Report Status PENDING  Incomplete  Fungus Culture With Stain     Status: None (Preliminary result)   Collection Time: 03/09/19 12:24 PM  Result Value Ref Range Status   Fungus Stain Final report  Final    Comment: (NOTE) Performed At: [facility] [place], [place] [id number] Rush Farmer MD YP:[id number]    Fungus (Mycology) Culture PENDING  Incomplete   Fungal Source  TISSUE  Final    Comment: ABDOMINAL Performed at [facility], [address], [place], [place] [address]   Aerobic Culture (superficial specimen)     Status: None (Preliminary result)   Collection Time: 03/09/19 12:24 PM   Specimen: Tissue  Result Value Ref Range Status   Specimen Description   Final    TISSUE ABDOMEN Performed at [facility], [place] [address], [place], [place] [address]    Special Requests   Final    NONE Performed at [facility], [address], [place], [place] [address]    Gram Stain   Final    FEW WBC PRESENT, PREDOMINANTLY MONONUCLEAR RARE GRAM VARIABLE  ROD    Culture   Final    FEW GRAM NEGATIVE RODS CULTURE REINCUBATED FOR BETTER GROWTH Performed at [facility], [place] [address], [place], [place] [address]    Report Status PENDING  Incomplete  Fungus Culture Result     Status: None   Collection Time: 03/09/19 12:24 PM  Result Value Ref Range Status   Result 1 Comment  Final    Comment: (NOTE) KOH/Calcofluor preparation:  no fungus observed. Performed At: [facility] [place], [place] [id number] Rush Farmer MD ZG:[id number]      Labs: BNP (last 3 results) Recent Labs    08/16/18 1848 01/10/19 1646 02/17/19 1522  BNP 464.0* 2,107.0* 5,916.3*   Basic Metabolic Panel: Recent Labs  Lab 03/07/19 1915 03/08/19 0509 03/09/19 0535 03/10/19 0500 03/11/19 0545  NA 135 138 138 137  --   K 3.7 3.2* 3.7 3.9  --   CL 101 102 96* 96*  --   CO2 24 27 30  32  --   GLUCOSE 237* 58* 178* 130*  --   BUN 20 17 18 19   --   CREATININE 0.81 0.80 1.09* 1.23* 1.29*  CALCIUM 7.8* 8.0* 7.7* 7.7*  --   MG  --   --  1.2* 1.6*  --    Liver Function Tests: Recent Labs  Lab 03/10/19 0500  AST 9*  ALT 8  ALKPHOS 80  BILITOT 0.9  PROT 5.3*  ALBUMIN 2.3*   No results for input(s): LIPASE, AMYLASE in the last 168 hours. No results for input(s): AMMONIA in the last 168 hours. CBC: Recent Labs  Lab 03/07/19 1915 03/08/19 0509 03/09/19 0535 03/10/19 0500 03/11/19 0545  WBC 10.5 9.5 9.9 10.1 9.3  NEUTROABS 7.9*  --   --  7.0  --   HGB 6.8* 7.9* 7.1* 7.3* 7.6*  HCT 23.8* 27.0* 24.5* 25.2* 26.0*  MCV 96.0 94.1 93.9 94.4 93.9  PLT 307 338 326 346 345   Cardiac Enzymes: No results for input(s): CKTOTAL, CKMB, CKMBINDEX, TROPONINI in the last 168 hours. BNP: Invalid input(s): POCBNP CBG: Recent Labs  Lab 03/10/19 2040 03/11/19 0012 03/11/19 0428 03/11/19 0818 03/11/19 1129  GLUCAP 226* 261* 223* 185* 160*   D-Dimer No results for input(s): DDIMER in the last 72 hours. Hgb A1c No results  for input(s): HGBA1C in the last 72 hours. Lipid Profile No results for input(s): CHOL, HDL, LDLCALC, TRIG, CHOLHDL, LDLDIRECT in the last 72 hours. Thyroid function studies No results for input(s): TSH, T4TOTAL, T3FREE, THYROIDAB in the last 72 hours.  Invalid input(s): FREET3 Anemia work up No results for input(s): VITAMINB12, FOLATE, FERRITIN, TIBC, IRON, RETICCTPCT in the last 72 hours. Urinalysis    Component Value Date/Time   COLORURINE YELLOW 03/07/2019 2104   APPEARANCEUR HAZY (A)  03/07/2019 2104   LABSPEC 1.024 03/07/2019 2104   PHURINE 6.0 03/07/2019 2104   GLUCOSEU NEGATIVE 03/07/2019 2104   HGBUR NEGATIVE 03/07/2019 2104   BILIRUBINUR NEGATIVE 03/07/2019 2104   [place] NEGATIVE 03/07/2019 2104   PROTEINUR 100 (A) 03/07/2019 2104   UROBILINOGEN 0.2 12/01/2007 1025   NITRITE NEGATIVE 03/07/2019 2104   LEUKOCYTESUR MODERATE (A) 03/07/2019 2104   Sepsis Labs Invalid input(s): PROCALCITONIN,  WBC,  LACTICIDVEN Microbiology Recent Results (from the past 240 hour(s))  Blood culture (routine x 2)     Status: None (Preliminary result)   Collection Time: 03/07/19  7:03 PM   Specimen: BLOOD  Result Value Ref Range Status   Specimen Description BLOOD RIGHT ANTECUBITAL  Final   Special Requests   Final    BOTTLES DRAWN AEROBIC AND ANAEROBIC Blood Culture results may not be optimal due to an inadequate volume of blood received in culture bottles   Culture   Final    NO GROWTH 4 DAYS Performed at [facility], [address], [place], [place] [address]    Report Status PENDING  Incomplete  Wound or Superficial Culture     Status: None (Preliminary result)   Collection Time: 03/07/19  7:10 PM   Specimen: Abdomen; Wound  Result Value Ref Range Status   Specimen Description   Final    ABDOMEN Performed at [facility], [address] [place], [place] [address]    Special Requests   Final    Immunocompromised Performed at [facility], [address], [place], [place]  [address]    Gram Stain   Final    NO WBC SEEN ABUNDANT GRAM NEGATIVE RODS ABUNDANT GRAM POSITIVE COCCI IN PAIRS IN CLUSTERS RARE GRAM POSITIVE RODS    Culture   Final    ABUNDANT PROTEUS MIRABILIS SUSCEPTIBILITIES TO FOLLOW Performed at [facility], [place] [address], [place], [place] [address]    Report Status PENDING  Incomplete  Blood culture (routine x 2)     Status: None (Preliminary result)   Collection Time: 03/07/19  7:18 PM   Specimen: BLOOD  Result Value Ref Range Status   Specimen Description BLOOD LEFT HAND  Final   Special Requests AEROBIC BOTTLE ONLY Blood Culture adequate volume  Final   Culture   Final    NO GROWTH 4 DAYS Performed at [facility], [address] [place] [address] [place], [place] [address]    Report Status PENDING  Incomplete  SARS Coronavirus 2 (CEPHEID - Performed in [place] hospital lab), Hosp Order     Status: None   Collection Time: 03/07/19  7:19 PM   Specimen: Nasopharyngeal Swab  Result Value Ref Range Status   SARS Coronavirus 2 NEGATIVE NEGATIVE Final    Comment: (NOTE) If result is NEGATIVE SARS-CoV-2 target nucleic acids are NOT DETECTED. The SARS-CoV-2 RNA is generally detectable in upper and lower  respiratory specimens during the acute phase of infection. The lowest  concentration of SARS-CoV-2 viral copies this assay can detect is 250  copies / mL. A negative result does not preclude SARS-CoV-2 infection  and should not be used as the sole basis for treatment or other  patient management decisions.  A negative result may occur with  improper specimen collection / handling, submission of specimen other  than nasopharyngeal swab, presence of viral mutation(s) within the  areas targeted by this assay, and inadequate number of viral copies  (<250 copies / mL). A negative result must be combined with clinical  observations,  patient history, and epidemiological information. If result is POSITIVE SARS-CoV-2 target nucleic acids are  DETECTED. The SARS-CoV-2 RNA is generally detectable in upper and lower  respiratory specimens dur ing the acute phase of infection.  Positive  results are indicative of active infection with SARS-CoV-2.  Clinical  correlation with patient history and other diagnostic information is  necessary to determine patient infection status.  Positive results do  not rule out bacterial infection or co-infection with other viruses. If result is PRESUMPTIVE POSTIVE SARS-CoV-2 nucleic acids MAY BE PRESENT.   A presumptive positive result was obtained on the submitted specimen  and confirmed on repeat testing.  While 2019 novel coronavirus  (SARS-CoV-2) nucleic acids may be present in the submitted sample  additional confirmatory testing may be necessary for epidemiological  and / or clinical management purposes  to differentiate between  SARS-CoV-2 and other Sarbecovirus currently known to infect humans.  If clinically indicated additional testing with an alternate test  methodology [phone]) is advised. The SARS-CoV-2 RNA is generally  detectable in upper and lower respiratory sp ecimens during the acute  phase of infection. The expected result is Negative. Fact Sheet for Patients:  StrictlyIdeas.no Fact Sheet for Healthcare Providers: BankingDealers.co.za This test is not yet approved or cleared by the [place] FDA and has been authorized for detection and/or diagnosis of SARS-CoV-2 by FDA under an Emergency Use Authorization (EUA).  This EUA will remain in effect (meaning this test can be used) for the duration of the COVID-19 declaration under Section 564(b)(1) of the Act, 21 U.S.C. section 360bbb-3(b)(1), unless the authorization is terminated or revoked sooner. Performed at [facility], [address], [place], [place] [address]   Aerobic/Anaerobic Culture (surgical/deep wound)     Status: None (Preliminary result)   Collection Time:  03/09/19 12:24 PM   Specimen: Wound  Result Value Ref Range Status   Specimen Description   Final    WOUND ABDOMEN Performed at [place] [address] [place], [place] [address]    Special Requests   Final    NONE Performed at [facility], [address], [place], [place] [address]    Gram Stain   Final    NO WBC SEEN FEW GRAM VARIABLE ROD RARE GRAM POSITIVE COCCI Performed at [facility], [place] [address] [place], [place] [address]    Culture MODERATE PROTEUS MIRABILIS  Final   Report Status PENDING  Incomplete  Fungus Culture With Stain     Status: None (Preliminary result)   Collection Time: 03/09/19 12:24 PM  Result Value Ref Range Status   Fungus Stain Final report  Final    Comment: (NOTE) Performed At: [facility] [place], [place] [id number] Rush Farmer MD [id number]:[id number]    Fungus (Mycology) Culture PENDING  Incomplete   Fungal Source TISSUE  Final    Comment: ABDOMINAL Performed at [facility], [address] [place], [place] [address]   Aerobic Culture (superficial specimen)     Status: None (Preliminary result)   Collection Time: 03/09/19 12:24 PM   Specimen: Tissue  Result Value Ref Range Status   Specimen Description   Final    TISSUE ABDOMEN Performed at [facility], [address] [place], [place] [address]    Special Requests   Final    NONE Performed at [facility], [address], [place],  [address]    Gram Stain   Final    FEW WBC PRESENT, PREDOMINANTLY  MONONUCLEAR RARE GRAM VARIABLE ROD    Culture   Final    FEW GRAM NEGATIVE RODS CULTURE REINCUBATED FOR BETTER GROWTH Performed at [facility], [place] [address] [place], [place] [address]    Report Status PENDING  Incomplete  Fungus Culture Result     Status: None   Collection Time: 03/09/19 12:24 PM  Result Value Ref Range Status   Result 1 Comment  Final    Comment: (NOTE) KOH/Calcofluor preparation:  no fungus  observed. Performed At: [facility] [place], [place] [id number] Rush Farmer MD QJ:[id number]    Time coordinating discharge: 45 minutes   SIGNED:  Irwin Brakeman, MD  [facility] 03/11/2019, 12:19 PM How to contact the [facility] Attending or Consulting provider [place] or covering provider during after hours [place], for this patient?  1. Check the care team in [facility] and look for a) attending/consulting [facility] provider listed and b) the [facility] team listed 2. Log into [email or web address] and use [place]'s universal password to access. If you do not have the password, please contact the hospital operator. 3. Locate the [facility] provider you are looking for under [facility] and page to a number that you can be directly reached. 4. If you still have difficulty reaching the provider, please page the [facility] (Director on Call) for the Hospitalists listed on amion for assistance.

## 2019-03-11 NOTE — Discharge Instructions (Signed)
PLEASE GO TO APPOINTMENT WITH DERMATOLOGIST ON 7/28 AT 2:20 PM FOR MANAGEMENT OF PYOGENIC GANGRENOSUM.    PLEASE TAKE STEROIDS EVERY MORNING WITH BREAKFAST.   MONITOR BLOOD SUGARS CLOSELY AND REPORT TO PRIMARY CARE PROVIDER IF YOU ARE HAVING BLOOD SUGARS GREATER THAN 200.   YOU MAY HAVE HIGHER BLOOD SUGARS WHILE YOU ARE ON THE STEROIDS.  YOU MAY HAVE TO INCREASE YOUR INSULIN DOSES. DISCUSS WITH YOUR PRIMARY CARE DOCTOR.    PLEASE TAKE THE ANTIBIOTICS FOR 5 MORE DAYS THEN STOP.    Negative Pressure Wound Vacuum Wound vacuum to abdomen with black sponge to abdomen, 125 mm Hg pressure. Needs to be changed three times a week.  Home Health Arranged.  If the wound bleeds, hold pressure on the bleeding for 10 minutes, if does not resolve or large amount (soaking multiple pads) then should go to the ED.   If wound vacuum does not seal (Leaks), then check the cone for a leak. If the upside down cone is black, there is a leak. Apply tape over the wound to see if this seals the leak. Call your home health RN to ask for further assistance.   If leak is unable to be fixed, remove the wound vacuum (tape and sponge) and place a saline dampened kerlix (large gauze) and ABD pad and paper tape until a RN can replace the vacuum.     What is pyoderma gangrenosum (PG) What is pyoderma gangrenosum (PG)?--Pyoderma gangrenosum (called "PG" here) is a rare skin disease that causes painful sores. It can happen at any age, but is most common in people between 54 and 47. It is more common in women than in men. PG is more common in people with certain conditions. These include: ?Digestive system diseases, such as Crohn disease and ulcerative colitis ?Blood diseases and blood cancers ?Arthritis, such as rheumatoid arthritis and ankylosing spondylitis Healthy people can also get PG. What are the symptoms of PG?--The first symptom is usually a raised bump or blister. It might be filled with fluid or pus. It can  spread out and form an open sore (picture 1). The edges sometimes look purple. PG blisters and sores can be very painful. The symptoms of PG are most common on the legs. But bumps, blisters, and sores can also happen on other parts of the body. A person who had a colostomy can get PG around the opening called the "stoma" (figure 1) where bowel movements leave the body. A few people get PG inside the body, but this is very rare. Should I see a doctor or nurse?--See your doctor or nurse if you have: ?Painful bumps or blisters ?Open sores that do not heal quickly Will I need tests?--Yes. It can be hard to tell for sure if someone has PG without doing tests. That's because many other conditions cause similar symptoms. The doctor or nurse will learn about your symptoms, do an exam, and order tests. The tests can check for diseases people with PG often have, or show if you have a condition that is not PG. Tests can include: ?A test called a "biopsy" - In this test, a doctor takes a sample of tissue from the area with a blister or sore. Another doctor looks at the tissue under a microscope for signs of PG. ?Blood tests - These can show if you have one of the conditions that people with PG often have. ?A chest X-ray ?A test called a "colonoscopy" (figure 2) - During a colonoscopy, the doctor  inserts a tube and a tiny camera into your anus and up to your colon. He or she can check your colon for signs of Crohn disease or ulcerative colitis. How is PG treated?--Taking good care of the sores can help PG to heal. Your doctor or nurse will give you instructions about how to gently clean and cover your sores.  You will also need medicine to help PG heal. The most common treatments include: ?Prescription creams ?Pills you take by mouth - If one medicine does not work, your doctor can try something else. ?An injection (shot) of medicine around the area with PG People with PG might need more than 1 treatment at  a time. For example, doctors might prescribe a cream to put on the area and pills to take by mouth. Or a person might take 2 different types of pills. Sometimes doctors prescribe medicines called "biologics." One example is a medicine called infliximab. These medicines seem to help some people with PG. Can PG be prevented?--Doctors do not yet know how to prevent PG. But if you already have it, avoiding cuts or scrapes could help prevent new blisters or sores. What will my life be like?--PG takes several months or years to go away completely, so you might need treatment for a long time. When PG heals, it usually leaves scars. It can also come back later. Be sure to tell all of your doctors and nurses that you had PG. If you find new sores, bumps, or blisters, talk to a doctor or nurse.      IMPORTANT INFORMATION: PAY CLOSE ATTENTION   PHYSICIAN DISCHARGE INSTRUCTIONS  Follow with Primary care provider  [name], MD  and other consultants as instructed by your Hospitalist Physician  [name] IF SYMPTOMS COME BACK, WORSEN OR NEW PROBLEM DEVELOPS   Please note: You were cared for by a hospitalist during your hospital stay. Every effort will be made to forward records to your primary care provider.  You can request that your primary care provider send for your hospital records if they have not received them.  Once you are discharged, your primary care physician will handle any further medical issues. Please note that NO REFILLS for any discharge medications will be authorized once you are discharged, as it is imperative that you return to your primary care physician (or establish a relationship with a primary care physician if you do not have one) for your post hospital discharge needs so that they can reassess your need for medications and monitor your lab values.  Please get a complete blood count and chemistry panel checked by your Primary MD at  your next visit, and again as instructed by your Primary MD.  Get Medicines reviewed and adjusted: Please take all your medications with you for your next visit with your Primary MD  Laboratory/radiological data: Please request your Primary MD to go over all hospital tests and procedure/radiological results at the follow up, please ask your primary care provider to get all Hospital records sent to his/her office.  In some cases, they will be blood work, cultures and biopsy results pending at the time of your discharge. Please request that your primary care provider follow up on these results.  If you are diabetic, please bring your blood sugar readings with you to your follow up appointment with primary care.    Please call and make your follow up appointments as soon as possible.    Also Note  the following: If you experience worsening of your admission symptoms, develop shortness of breath, life threatening emergency, suicidal or homicidal thoughts you must seek medical attention immediately by calling 911 or calling your MD immediately  if symptoms less severe.  You must read complete instructions/literature along with all the possible adverse reactions/side effects for all the Medicines you take and that have been prescribed to you. Take any new Medicines after you have completely understood and accpet all the possible adverse reactions/side effects.   Do not drive when taking Pain medications or sleeping medications (Benzodiazepines)  Do not take more than prescribed Pain, Sleep and Anxiety Medications. It is not advisable to combine anxiety,sleep and pain medications without talking with your primary care practitioner  Special Instructions: If you have smoked or chewed Tobacco  in the last 2 yrs please stop smoking, stop any regular Alcohol  and or any Recreational drug use.  Wear Seat belts while driving.  Do not drive if taking any narcotic, mind altering or controlled substances or  recreational drugs or alcohol.

## 2019-03-11 NOTE — TOC Transition Note (Addendum)
Transition of Care [facility]) - CM/SW Discharge Note   Patient Details  Name: [name] MRN: [id number] Date of Birth: 05-18-1951  [facility] CM/SW Contact:  Shade Flood, LCSW Phone Number: 03/11/2019, 12:08 PM   Clinical Narrative:     Pt stable for dc today. Updated Linda at [facility] of dc. They will follow pt at home. Follow up appointments scheduled at MD request. Faxed DC summary and Dr. Constance Haw' note at clinic request. There are no other TOC needs for dc.  Final next level of care: [place] Barriers to Discharge: Barriers Resolved   Patient Goals and CMS Choice Patient states their goals for this hospitalization and ongoing recovery are:: return home CMS Medicare.gov Compare Post Acute Care list provided to:: Patient Choice offered to / list presented to : Patient  Discharge Placement                       Discharge Plan and Services     Post Acute Care Choice: Home Health, Durable Medical Equipment          DME Arranged: Negative pressure wound device DME Agency: [employer] Date DME Agency Contacted: 03/10/19   Representative spoke with at DME Agency: [place] Con[place]            Social Determinants of Health ([place]) Interventions     Readmission Risk Interventions Readmission Risk Prevention Plan 03/11/2019 03/10/2019 02/19/2019  Transportation Screening - Complete Complete  Medication Review Press photographer) Complete - Complete  PCP or Specialist appointment within 3-5 days of discharge Complete - Not Complete  HRI or Magazine - Complete Complete  SW Recovery Care/Counseling Consult - Complete Patient refused  Palliative Care Screening - Not Applicable Not [place] - Not Applicable Not Applicable  Some recent data might be hidden

## 2019-03-11 NOTE — Progress Notes (Signed)
[facility]  Patient did well with wound vac change. Changed to home vac. Some minor bleeding but nothing major. 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 Based on my reading the treatment would be systemic steroids for at least 4 weeks with a taper.  Given the possibility of the pathology and the possibility that surgery could make this process worse, I think we treat with steroids to be safe. The steroids will increased the healing time, but this will have to be acceptable to hopefully prevent any worsening of pyoderma gangrenosum.   Steroids for 4 weeks. PCP/ Derm to manage steroids.  Wound vacuum to abdomen with black sponge to abdomen, 125 mm Hg pressure. Needs to be changed three times a week.  Home Health Arranged.  If the wound bleeds, hold pressure on the bleeding for 10 minutes, if does not resolve or large amount (soaking multiple pads) then should go to the ED.   If wound vacuum does not seal (Leaks), then check the cone for a leak. If the upside down cone is black, there is a leak. Apply tape over the wound to see if this seals the leak. Call your home health RN to ask for further assistance.   If leak is unable to be fixed, remove the wound vacuum (tape and sponge) and place a saline dampened kerlix (large gauze) and ABD pad and paper tape until a RN can replace the vacuum.   I will see the patient in person 03/24/19. My office will call her with an appointment. I will talk with her home health RN about me changing the vacuum that day.  Patient needs to bring vacuum supplies with her to the clinic.   Curlene Labrum, MD [facility] [address] [place], [place] [address] [phone] (office)

## 2019-03-12 LAB — AEROBIC/ANAEROBIC CULTURE W GRAM STAIN (SURGICAL/DEEP WOUND): Gram Stain: NONE SEEN

## 2019-03-12 LAB — CULTURE, BLOOD (ROUTINE X 2)
Culture: NO GROWTH
Culture: NO GROWTH
Special Requests: ADEQUATE

## 2019-03-13 LAB — AEROBIC CULTURE W GRAM STAIN (SUPERFICIAL SPECIMEN)

## 2019-03-14 ENCOUNTER — Other Ambulatory Visit (HOSPITAL_COMMUNITY): Payer: BC Managed Care – PPO

## 2019-03-14 DIAGNOSIS — J449 Chronic obstructive pulmonary disease, unspecified: Secondary | ICD-10-CM | POA: Diagnosis not present

## 2019-03-14 DIAGNOSIS — N183 Chronic kidney disease, stage 3 (moderate): Secondary | ICD-10-CM | POA: Diagnosis not present

## 2019-03-14 DIAGNOSIS — I130 Hypertensive heart and chronic kidney disease with heart failure and stage 1 through stage 4 chronic kidney disease, or unspecified chronic kidney disease: Secondary | ICD-10-CM | POA: Diagnosis not present

## 2019-03-14 DIAGNOSIS — T8189XA Other complications of procedures, not elsewhere classified, initial encounter: Secondary | ICD-10-CM | POA: Diagnosis not present

## 2019-03-14 DIAGNOSIS — L03311 Cellulitis of abdominal wall: Secondary | ICD-10-CM | POA: Diagnosis not present

## 2019-03-14 DIAGNOSIS — E1122 Type 2 diabetes mellitus with diabetic chronic kidney disease: Secondary | ICD-10-CM | POA: Diagnosis not present

## 2019-03-14 DIAGNOSIS — S31109D Unspecified open wound of abdominal wall, unspecified quadrant without penetration into peritoneal cavity, subsequent encounter: Secondary | ICD-10-CM | POA: Diagnosis not present

## 2019-03-14 DIAGNOSIS — Z87440 Personal history of urinary (tract) infections: Secondary | ICD-10-CM | POA: Diagnosis not present

## 2019-03-14 DIAGNOSIS — I5033 Acute on chronic diastolic (congestive) heart failure: Secondary | ICD-10-CM | POA: Diagnosis not present

## 2019-03-14 DIAGNOSIS — E1165 Type 2 diabetes mellitus with hyperglycemia: Secondary | ICD-10-CM | POA: Diagnosis not present

## 2019-03-14 DIAGNOSIS — I482 Chronic atrial fibrillation, unspecified: Secondary | ICD-10-CM | POA: Diagnosis not present

## 2019-03-14 DIAGNOSIS — Z6841 Body Mass Index (BMI) 40.0 and over, adult: Secondary | ICD-10-CM | POA: Diagnosis not present

## 2019-03-14 DIAGNOSIS — F329 Major depressive disorder, single episode, unspecified: Secondary | ICD-10-CM | POA: Diagnosis not present

## 2019-03-15 ENCOUNTER — Other Ambulatory Visit: Payer: Self-pay

## 2019-03-15 DIAGNOSIS — T8189XA Other complications of procedures, not elsewhere classified, initial encounter: Secondary | ICD-10-CM | POA: Diagnosis not present

## 2019-03-15 NOTE — Patient Outreach (Signed)
[facility]) Care Management  03/15/2019  SHARIAN DELIA 07-Feb-1951 [id number]    Mrs. Hogen was recently hospitalized d/t abdominal wound infection. Per chart review, pathology indicated late stage pyoderma gangrenosum.  Attempted post discharge follow-up. I was unable to reach her today. Will attempt outreach within 3-4 business days.       [place] [phone]

## 2019-03-16 DIAGNOSIS — I130 Hypertensive heart and chronic kidney disease with heart failure and stage 1 through stage 4 chronic kidney disease, or unspecified chronic kidney disease: Secondary | ICD-10-CM | POA: Diagnosis not present

## 2019-03-16 DIAGNOSIS — S31109D Unspecified open wound of abdominal wall, unspecified quadrant without penetration into peritoneal cavity, subsequent encounter: Secondary | ICD-10-CM | POA: Diagnosis not present

## 2019-03-16 DIAGNOSIS — I5033 Acute on chronic diastolic (congestive) heart failure: Secondary | ICD-10-CM | POA: Diagnosis not present

## 2019-03-16 DIAGNOSIS — Z87440 Personal history of urinary (tract) infections: Secondary | ICD-10-CM | POA: Diagnosis not present

## 2019-03-16 DIAGNOSIS — Z6841 Body Mass Index (BMI) 40.0 and over, adult: Secondary | ICD-10-CM | POA: Diagnosis not present

## 2019-03-16 DIAGNOSIS — J449 Chronic obstructive pulmonary disease, unspecified: Secondary | ICD-10-CM | POA: Diagnosis not present

## 2019-03-16 DIAGNOSIS — E1122 Type 2 diabetes mellitus with diabetic chronic kidney disease: Secondary | ICD-10-CM | POA: Diagnosis not present

## 2019-03-16 DIAGNOSIS — N183 Chronic kidney disease, stage 3 (moderate): Secondary | ICD-10-CM | POA: Diagnosis not present

## 2019-03-16 DIAGNOSIS — F329 Major depressive disorder, single episode, unspecified: Secondary | ICD-10-CM | POA: Diagnosis not present

## 2019-03-16 DIAGNOSIS — I482 Chronic atrial fibrillation, unspecified: Secondary | ICD-10-CM | POA: Diagnosis not present

## 2019-03-16 DIAGNOSIS — E1165 Type 2 diabetes mellitus with hyperglycemia: Secondary | ICD-10-CM | POA: Diagnosis not present

## 2019-03-16 DIAGNOSIS — L03311 Cellulitis of abdominal wall: Secondary | ICD-10-CM | POA: Diagnosis not present

## 2019-03-18 ENCOUNTER — Emergency Department (HOSPITAL_COMMUNITY)
Admission: EM | Admit: 2019-03-18 | Discharge: 2019-03-18 | Disposition: A | Discharge status: Home / Self Care | Payer: BC Managed Care – PPO | Attending: Emergency Medicine | Admitting: Emergency Medicine

## 2019-03-18 ENCOUNTER — Other Ambulatory Visit: Payer: Self-pay

## 2019-03-18 DIAGNOSIS — N183 Chronic kidney disease, stage 3 (moderate): Secondary | ICD-10-CM | POA: Insufficient documentation

## 2019-03-18 DIAGNOSIS — I130 Hypertensive heart and chronic kidney disease with heart failure and stage 1 through stage 4 chronic kidney disease, or unspecified chronic kidney disease: Secondary | ICD-10-CM | POA: Diagnosis not present

## 2019-03-18 DIAGNOSIS — F329 Major depressive disorder, single episode, unspecified: Secondary | ICD-10-CM | POA: Diagnosis not present

## 2019-03-18 DIAGNOSIS — I5033 Acute on chronic diastolic (congestive) heart failure: Secondary | ICD-10-CM | POA: Diagnosis not present

## 2019-03-18 DIAGNOSIS — Z91040 Latex allergy status: Secondary | ICD-10-CM | POA: Diagnosis not present

## 2019-03-18 DIAGNOSIS — K068 Other specified disorders of gingiva and edentulous alveolar ridge: Secondary | ICD-10-CM

## 2019-03-18 DIAGNOSIS — Z888 Allergy status to other drugs, medicaments and biological substances status: Secondary | ICD-10-CM | POA: Diagnosis not present

## 2019-03-18 DIAGNOSIS — E1165 Type 2 diabetes mellitus with hyperglycemia: Secondary | ICD-10-CM | POA: Diagnosis not present

## 2019-03-18 DIAGNOSIS — I509 Heart failure, unspecified: Secondary | ICD-10-CM | POA: Diagnosis not present

## 2019-03-18 DIAGNOSIS — Z7901 Long term (current) use of anticoagulants: Secondary | ICD-10-CM | POA: Insufficient documentation

## 2019-03-18 DIAGNOSIS — Z7984 Long term (current) use of oral hypoglycemic drugs: Secondary | ICD-10-CM | POA: Diagnosis not present

## 2019-03-18 DIAGNOSIS — Z7952 Long term (current) use of systemic steroids: Secondary | ICD-10-CM | POA: Insufficient documentation

## 2019-03-18 DIAGNOSIS — I482 Chronic atrial fibrillation, unspecified: Secondary | ICD-10-CM | POA: Diagnosis not present

## 2019-03-18 DIAGNOSIS — Z79899 Other long term (current) drug therapy: Secondary | ICD-10-CM | POA: Insufficient documentation

## 2019-03-18 DIAGNOSIS — J449 Chronic obstructive pulmonary disease, unspecified: Secondary | ICD-10-CM | POA: Insufficient documentation

## 2019-03-18 DIAGNOSIS — Z87440 Personal history of urinary (tract) infections: Secondary | ICD-10-CM | POA: Diagnosis not present

## 2019-03-18 DIAGNOSIS — Z6841 Body Mass Index (BMI) 40.0 and over, adult: Secondary | ICD-10-CM | POA: Diagnosis not present

## 2019-03-18 DIAGNOSIS — L03311 Cellulitis of abdominal wall: Secondary | ICD-10-CM | POA: Diagnosis not present

## 2019-03-18 DIAGNOSIS — I4891 Unspecified atrial fibrillation: Secondary | ICD-10-CM | POA: Insufficient documentation

## 2019-03-18 DIAGNOSIS — E1122 Type 2 diabetes mellitus with diabetic chronic kidney disease: Secondary | ICD-10-CM | POA: Diagnosis not present

## 2019-03-18 DIAGNOSIS — S31109D Unspecified open wound of abdominal wall, unspecified quadrant without penetration into peritoneal cavity, subsequent encounter: Secondary | ICD-10-CM | POA: Diagnosis not present

## 2019-03-18 LAB — CBC WITH DIFFERENTIAL/PLATELET
Abs Immature Granulocytes: 0.13 10*3/uL — ABNORMAL HIGH (ref 0.00–0.07)
Basophils Absolute: 0 10*3/uL (ref 0.0–0.1)
Basophils Relative: 0 %
Eosinophils Absolute: 0 10*3/uL (ref 0.0–0.5)
Eosinophils Relative: 0 %
HCT: 28.1 % — ABNORMAL LOW (ref 36.0–46.0)
Hemoglobin: 8.4 g/dL — ABNORMAL LOW (ref 12.0–15.0)
Immature Granulocytes: 1 %
Lymphocytes Relative: 6 %
Lymphs Abs: 0.8 10*3/uL (ref 0.7–4.0)
MCH: 27.7 pg (ref 26.0–34.0)
MCHC: 29.9 g/dL — ABNORMAL LOW (ref 30.0–36.0)
MCV: 92.7 fL (ref 80.0–100.0)
Monocytes Absolute: 0.5 10*3/uL (ref 0.1–1.0)
Monocytes Relative: 4 %
Neutro Abs: 10.9 10*3/uL — ABNORMAL HIGH (ref 1.7–7.7)
Neutrophils Relative %: 89 %
Platelets: 419 10*3/uL — ABNORMAL HIGH (ref 150–400)
RBC: 3.03 MIL/uL — ABNORMAL LOW (ref 3.87–5.11)
RDW: 15.7 % — ABNORMAL HIGH (ref 11.5–15.5)
WBC: 12.3 10*3/uL — ABNORMAL HIGH (ref 4.0–10.5)
nRBC: 0 % (ref 0.0–0.2)

## 2019-03-18 LAB — BASIC METABOLIC PANEL
Anion gap: 15 (ref 5–15)
BUN: 33 mg/dL — ABNORMAL HIGH (ref 8–23)
CO2: 25 mmol/L (ref 22–32)
Calcium: 8.2 mg/dL — ABNORMAL LOW (ref 8.9–10.3)
Chloride: 93 mmol/L — ABNORMAL LOW (ref 98–111)
Creatinine, Ser: 1 mg/dL (ref 0.44–1.00)
GFR calc Af Amer: >60 mL/min (ref >60)
GFR calc non Af Amer: 58 mL/min — ABNORMAL LOW (ref >60)
Glucose, Bld: 550 mg/dL (ref 70–99)
Potassium: 3.8 mmol/L (ref 3.5–5.1)
Sodium: 133 mmol/L — ABNORMAL LOW (ref 135–145)

## 2019-03-18 LAB — CBG MONITORING, ED
Glucose-Capillary: 453 mg/dL — ABNORMAL HIGH (ref 70–99)
Glucose-Capillary: 334 mg/dL — ABNORMAL HIGH (ref 70–99)

## 2019-03-18 LAB — TYPE AND SCREEN
ABO/RH(D): A POS
Antibody Screen: NEGATIVE

## 2019-03-18 LAB — PROTIME-INR
INR: 1.5 — ABNORMAL HIGH (ref 0.8–1.2)
Prothrombin Time: 17.5 seconds — ABNORMAL HIGH (ref 11.4–15.2)

## 2019-03-18 MED ORDER — FENTANYL CITRATE (PF) 100 MCG/2ML IJ SOLN
50.0000 ug | Freq: Once | INTRAMUSCULAR | Status: AC
  Administered 2019-03-18: 05:00:00 50 ug via INTRAVENOUS
  Filled 2019-03-18: qty 2

## 2019-03-18 MED ORDER — FENTANYL CITRATE (PF) 100 MCG/2ML IJ SOLN
50.0000 ug | Freq: Once | INTRAMUSCULAR | Status: AC
  Administered 2019-03-18: 04:00:00 50 ug via INTRAVENOUS
  Filled 2019-03-18: qty 2

## 2019-03-18 MED ORDER — INSULIN ASPART 100 UNIT/ML ~~LOC~~ SOLN
10.0000 [IU] | Freq: Once | SUBCUTANEOUS | Status: AC
  Administered 2019-03-18: 05:00:00 10 [IU] via SUBCUTANEOUS
  Filled 2019-03-18: qty 1

## 2019-03-18 MED ORDER — TRANEXAMIC ACID 1000 MG/10ML IV SOLN
500.0000 mg | Freq: Once | INTRAVENOUS | Status: AC
  Administered 2019-03-18: 500 mg via TOPICAL
  Filled 2019-03-18: qty 10

## 2019-03-18 MED ORDER — INSULIN ASPART 100 UNIT/ML ~~LOC~~ SOLN
5.0000 [IU] | Freq: Once | SUBCUTANEOUS | Status: AC
  Administered 2019-03-18: 06:00:00 5 [IU] via SUBCUTANEOUS
  Filled 2019-03-18: qty 1

## 2019-03-18 NOTE — ED Provider Notes (Signed)
[facility] EMERGENCY DEPARTMENT Provider Note   CSN: [id number] Arrival date & time: 03/18/19  0221     History   Chief Complaint Chief Complaint  Patient presents with   Gums bleeding    s/p teeth extractions    HPI [name] is a 68 y.o. female.     Patient here with persistent gum bleeding since multiple teeth extraction yesterday at 11 AM.  States she had 5 teeth removed as well as a bridge by her oral surgeon Dr. Lewanda Rife about 11:30 AM.  She is had constant oozing since.  She does take Eliquis for history of atrial fibrillation and LV thrombus and this was not stopped prior to the procedure.  Her last dose of Eliquis was the evening of July 29.  She reports some lightheadedness, no chest pain or shortness of breath.  She has been trying to stop the bleeding at home with tissues but is not been successful.  She was recent admitted for chronic abdominal wound that was debrided and has a wound VAC in place and she states this is doing well.     Past Medical History:  Diagnosis Date   Atrial fibrillation ([place])    CHF (congestive heart failure) ([place])    Depression    Diabetes mellitus without complication ([place])    History of transesophageal echocardiography (TEE) 03/2018   LV thrombus   Hypertension    Morbid obesity ([place])    Osteoporosis    Urinary incontinence    UTI (lower urinary tract infection) 01/2016   Cipro for Klebsiella pneumoniae isolate   Wheelchair bound    bedbound    Patient Active Problem List   Diagnosis Date Noted   Pyoderma gangrenosum 03/11/2019   Wound infection 03/09/2019   Chronic wound infection of abdomen    Wheelchair bound    Abdominal wall skin ulcer ([place]) 03/07/2019   Acute pulmonary edema ([place]) 02/17/2019   Iron deficiency anemia 02/01/2019   Abdominal wall ulcer, with fat layer exposed ([place]) 01/16/2019   Elevated troponin    Nausea and vomiting    Symptomatic bradycardia 01/10/2019   DKA (diabetic  ketoacidoses) ([place]) 01/10/2019   Urinary incontinence 12/28/2018   Recurrent urinary tract infection 12/16/2018   Wound, open, anterior abdominal wall 12/05/2018   Hypotension due to hypovolemia    Gastritis due to nonsteroidal anti-inflammatory drug    Esophageal dysphagia    GI bleed 09/03/2018   Coagulopathy ([place])    Colitis    Lower abdominal pain    Rectal bleeding    Dehydration 08/16/2018   Acute on chronic diastolic CHF (congestive heart failure) ([place]) 08/10/2018   Acute lower UTI 08/10/2018   Hypokalemia 08/10/2018   COPD (chronic obstructive pulmonary disease) ([place]) 08/10/2018   Thyroid lesion 08/10/2018   Closed fracture of right ankle 06/10/2018   Closed fracture of left tibia and fibula with routine healing 04/13/18 06/10/2018   Atrial fibrillation ([place]) 04/15/2018   Chronic diastolic CHF (congestive heart failure) ([place]) 04/15/2018   CKD (chronic kidney disease), stage III ([place]) 04/15/2018   Closed right fibular fracture 04/15/2018   Left tibial fracture 04/14/2018   CHF exacerbation ([place]) 04/10/2018   SOB (shortness of breath)    Acute CHF (congestive heart failure) ([place]) 04/01/2018   Atrial fibrillation with RVR ([place]) 04/01/2018   [place] 03/11/2016   Muscle spasms of lower extremity 03/04/2016   Acute renal insufficiency 02/26/2016   History of MRSA infection 02/26/2016   HTN (hypertension) 02/19/2016  Fracture, femur, distal ([place]) 02/19/2016   Fall 02/19/2016   Depression 02/19/2016   Closed left femoral fracture, initial encounter 02/19/2016   Femur fracture, left ([place]) 02/19/2016    Past Surgical History:  Procedure Laterality Date   ABDOMINAL HYSTERECTOMY     APPLICATION OF WOUND VAC  03/09/2019   Procedure: APPLICATION OF WOUND VAC;  Surgeon: Virl Cagey, MD;  Location: [facility] ORS;  Service: General;;   BIOPSY  09/06/2018   Procedure: BIOPSY;  Surgeon: Danie Binder, MD;  Location: [facility] ENDO SUITE;  Service:  Endoscopy;;  gastric bx's   CESAREAN SECTION     CHOLECYSTECTOMY     ESOPHAGEAL DILATION  09/06/2018   Procedure: ESOPHAGEAL DILATION;  Surgeon: Danie Binder, MD;  Location: [facility];  Service: Endoscopy;;   ESOPHAGOGASTRODUODENOSCOPY (EGD) WITH PROPOFOL N/A 09/06/2018   Procedure: ESOPHAGOGASTRODUODENOSCOPY (EGD) WITH PROPOFOL;  Surgeon: Danie Binder, MD;  Location: [facility] END[facility];  Service: Endoscopy;  Laterality: N/A;  dilatation   FEMUR IM NAIL Left 02/20/2016   FEMUR IM NAIL Left 02/20/2016   Procedure: INTRAMEDULLARY (IM) RETROGRADE FEMORAL NAILING;  Surgeon: Leandrew Koyanagi, MD;  Location: [place];  Service: Orthopedics;  Laterality: Left;   PANCREAS SURGERY  1967   1 cyst excised and one cyst drained   TEE WITHOUT CARDIOVERSION N/A 04/05/2018   Procedure: TRANSESOPHAGEAL ECHOCARDIOGRAM (TEE);  Surgeon: Dorothy Spark, MD;  Location: [facility];  Service: Cardiovascular;  Laterality: N/A;   VEIN LIGATION AND STRIPPING     WOUND DEBRIDEMENT N/A 03/09/2019   Procedure: EXCISIONAL DEBRIDEMENT OF ABDOMINAL WOUND ULCERS;  Surgeon: Virl Cagey, MD;  Location: [facility];  Service: General;  Laterality: N/A;   WRIST FRACTURE SURGERY       OB History   No obstetric history on file.      Home Medications    Prior to Admission medications   Medication Sig Start Date End Date Taking? Authorizing Provider  acetaminophen (TYLENOL) 500 MG tablet Take 500-1,000 mg by mouth every 8 (eight) hours as needed for mild pain or headache.     [provider]  albuterol (PROVENTIL HFA;VENTOLIN HFA) 108 (90 Base) MCG/ACT inhaler Inhale 2 puffs into the lungs every 6 (six) hours as needed for wheezing or shortness of breath. Patient taking differently: Inhale 2 puffs into the lungs every 6 (six) hours as needed for wheezing or shortness of breath (As needed).  08/14/18   Kathie Dike, MD  apixaban (ELIQUIS) 5 MG TABS tablet Take 1 tablet (5 mg total) by mouth 2 (two)  times daily. 07/21/18   Herminio Commons, MD  buPROPion (WELLBUTRIN XL) 300 MG 24 hr tablet Take 300 mg by mouth daily.  01/31/19   [provider]  diltiazem (CARDIZEM CD) 120 MG 24 hr capsule Take 1 capsule (120 mg total) by mouth daily. Patient taking differently: Take 120 mg by mouth daily. Cartia XT 09/10/18   Johnson, Clanford L, MD  escitalopram (LEXAPRO) 10 MG tablet Take 10 mg by mouth daily. 12/28/18   [provider]  ferrous sulfate (FERROUSUL) 325 (65 FE) MG tablet Take 1 tablet (325 mg total) by mouth 2 (two) times daily with a meal. 01/16/19   Domenic Polite, MD  fluticasone [facility]) 50 MCG/ACT nasal spray Place 2 sprays into both nostrils daily as needed for allergies or rhinitis (congestion).     [provider]  furosemide (LASIX) 20 MG tablet Take 2 tablets (40 mg total) by mouth 2 (two)  times daily. 03/11/19 04/10/19  Johnson, Clanford L, MD  glipiZIDE (GLUCOTROL) 10 MG tablet Take 10 mg by mouth 2 (two) times daily. 03/02/18   [provider]  levalbuterol Penne Lash) 0.63 MG/3ML nebulizer solution Take 0.63 mg by nebulization every 6 (six) hours as needed for wheezing or shortness of breath.  02/08/19   [provider]  LEVEMIR FLEXTOUCH 100 UNIT/ML Pen Inject 10 Units into the skin 2 (two) times a day. 01/05/19   [provider]  metoprolol tartrate (LOPRESSOR) 50 MG tablet Take 50 mg by mouth 2 (two) times daily.  02/12/19   [provider]  nystatin (MYCOSTATIN/NYSTOP) powder Apply topically daily as needed (rash in skin folds).    [provider]  OVER THE COUNTER MEDICATION Apply 1 application topically daily as needed (rash in skin folds). Baby butt paste    [provider]  oxyCODONE (OXY IR/ROXICODONE) 5 MG immediate release tablet Take 1 tablet (5 mg total) by mouth every 6 (six) hours as needed for severe pain. 03/11/19   Johnson, Clanford L, MD  pantoprazole (PROTONIX) 40 MG tablet Take 1 tablet  (40 mg total) by mouth daily before breakfast. Patient taking differently: Take 40 mg by mouth daily as needed (for GERD/acid reflux).  09/11/18   Johnson, Clanford L, MD  predniSONE (DELTASONE) 20 MG tablet Take 3 tablets (60 mg total) by mouth daily with breakfast. 03/12/19 04/11/19  Murlean Iba, MD  saccharomyces boulardii (FLORASTOR) 250 MG capsule Take 1 capsule (250 mg total) by mouth 2 (two) times daily. 03/11/19 04/10/19  Murlean Iba, MD    Family History Family History  Problem Relation Age of Onset   Diabetes Mother        died in her 30's of a stroke   Cancer Mother    Stroke Mother    Breast cancer Mother    Diabetes Father        died in his 46's of a stroke.   Stroke Father    Diabetes Brother        died @ 50 of a stroke.   Stroke Brother    Diabetes Maternal Grandmother    Diabetes Maternal Grandfather    Diabetes Paternal Grandmother    Diabetes Paternal Grandfather    Breast cancer Maternal Aunt     Social History Social History   Tobacco Use   Smoking status: Never Smoker   Smokeless tobacco: Never Used  Substance Use Topics   Alcohol use: No   Drug use: No     Allergies   Feraheme [ferumoxytol], Propranolol, Topamax [topiramate], and Latex   Review of Systems Review of Systems  Constitutional: Positive for fatigue. Negative for activity change and appetite change.  HENT: Positive for dental problem. Negative for ear discharge, nosebleeds, postnasal drip and trouble swallowing.   Respiratory: Positive for shortness of breath. Negative for chest tightness.   Cardiovascular: Negative for chest pain.  Gastrointestinal: Negative for abdominal pain, nausea and vomiting.  Genitourinary: Negative for dysuria and hematuria.  Musculoskeletal: Negative for arthralgias and myalgias.  Skin: Negative for rash.  Neurological: Positive for weakness and light-headedness. Negative for dizziness.    all other systems are negative  except as noted in the HPI and PMH.   Physical Exam Updated Vital Signs BP 139/61 (BP Location: Right Arm)    Pulse (!) 58    Temp 98.2 F (36.8 C) (Oral)    Resp (!) 21    Ht 5' 4"  (1.626 m)  Wt 90.3 kg    SpO2 100%    BMI 34.16 kg/m   Physical Exam Vitals signs and nursing note reviewed.  Constitutional:      General: She is not in acute distress.    Appearance: She is well-developed. She is obese.  HENT:     Head: Normocephalic and atraumatic.     Comments: Ecchymosis to left maxilla and mandible    Mouth/Throat:     Pharynx: No oropharyngeal exudate.     Comments: Extraction sites present in bilateral maxilla.  There is steady oozing of bright red blood from the left side of her maxilla at two extraction sites.   Edentulous on upper jaw. Eyes:     Conjunctiva/sclera: Conjunctivae normal.     Pupils: Pupils are equal, round, and reactive to light.     Comments: Pale conjunctiva  Neck:     Musculoskeletal: Normal range of motion and neck supple.     Comments: No meningismus. Cardiovascular:     Rate and Rhythm: Normal rate. Rhythm irregular.     Heart sounds: Normal heart sounds. No murmur.  Pulmonary:     Effort: Pulmonary effort is normal. No respiratory distress.     Breath sounds: Normal breath sounds.  Chest:     Chest wall: No tenderness.  Abdominal:     Palpations: Abdomen is soft.     Tenderness: There is no abdominal tenderness. There is no guarding or rebound.     Comments: Wound vac in place. Appears clean.  Musculoskeletal: Normal range of motion.        General: No tenderness.  Skin:    General: Skin is warm.  Neurological:     Mental Status: She is alert and oriented to person, place, and time.     Cranial Nerves: No cranial nerve deficit.     Motor: No abnormal muscle tone.     Coordination: Coordination normal.     Comments: No ataxia on finger to nose bilaterally. No pronator drift. 5/5 strength throughout. CN 2-12 intact.Equal grip strength.  Sensation intact.   Psychiatric:        Behavior: Behavior normal.      ED Treatments / Results  Labs (all labs ordered are listed, but only abnormal results are displayed) Labs Reviewed  CBC WITH DIFFERENTIAL/PLATELET - Abnormal; Notable for the following components:      Result Value   WBC 12.3 (*)    RBC 3.03 (*)    Hemoglobin 8.4 (*)    HCT 28.1 (*)    MCHC 29.9 (*)    RDW 15.7 (*)    Platelets 419 (*)    Neutro Abs 10.9 (*)    Abs Immature Granulocytes 0.13 (*)    All other components within normal limits  BASIC METABOLIC PANEL - Abnormal; Notable for the following components:   Sodium 133 (*)    Chloride 93 (*)    Glucose, Bld 550 (*)    BUN 33 (*)    Calcium 8.2 (*)    GFR calc non Af Amer 58 (*)    All other components within normal limits  PROTIME-INR - Abnormal; Notable for the following components:   Prothrombin Time 17.5 (*)    INR 1.5 (*)    All other components within normal limits  CBG MONITORING, ED - Abnormal; Notable for the following components:   Glucose-Capillary 453 (*)    All other components within normal limits  CBG MONITORING, ED - Abnormal; Notable for the following  components:   Glucose-Capillary 334 (*)    All other components within normal limits  TYPE AND SCREEN    EKG None  Radiology No results found.  Procedures Procedures (including critical care time)  Medications Ordered in ED Medications  tranexamic acid (CYKLOKAPRON) injection 500 mg (has no administration in time range)     Initial Impression / Assessment and Plan / ED Course  I have reviewed the triage vital signs and the nursing notes.  Pertinent labs & imaging results that were available during my care of the patient were reviewed by me and considered in my medical decision making (see chart for details).       Patient on blood thinners with gingival bleeding status post tooth extraction.  Vitals are stable.  Pressure held, patient given teabags as well as  topical TXA.  We will obtain CBC.  Hemoglobin 8.4 which is increased from previous values.  Blood sugar 550 with normal anion gap.  She is a diabetic who has been taking prednisone for her abdominal wound thought to be pyoderma gangrenosum.   Gingival bleeding is difficult to control but was successful with multiple doses of topical TXA and pressure. No areas suturable.  Vitals remained stable throughout ED course. Patient monitored for 1 hour to ensure no recurrence of bleeding.  She states she is going to see her dentist at 87 AM today. Observed in the ED for 1 hour without recurrence of bleeding. Advised patient to hold eliquis for one day after discussion of risks and benefits, follow soft diet and followup with her dentist.  Monitor blood sugars closely while on steroids. Improving to 334.  Return precautions discussed.   CRITICAL CARE Performed by: Ezequiel Essex Total critical care time: 45 minutes Critical care time was exclusive of separately billable procedures and treating other patients. Critical care was necessary to treat or prevent imminent or life-threatening deterioration. Critical care was time spent personally by me on the following activities: development of treatment plan with patient and/or surrogate as well as nursing, discussions with consultants, evaluation of patient's response to treatment, examination of patient, obtaining history from patient or surrogate, ordering and performing treatments and interventions, ordering and review of laboratory studies, ordering and review of radiographic studies, pulse oximetry and re-evaluation of patient's condition.  Final Clinical Impressions(s) / ED Diagnoses   Final diagnoses:  Gingival bleeding    ED Discharge Orders    None       Merl Bommarito, Annie Main, MD 03/18/19 [phone]

## 2019-03-18 NOTE — Discharge Instructions (Signed)
Follow-up with your dentist today as scheduled.  Follow a soft diet and do not disturb the areas of your mouth.  Watch your blood sugar closely while you are taking her steroids.  Return to the ED if you have new or worsening symptoms.

## 2019-03-18 NOTE — ED Notes (Signed)
Date and time results received: 03/18/19 0458   Test: Glucose Critical Value: 550  Name of Provider Notified: Rancour, MD

## 2019-03-18 NOTE — ED Triage Notes (Signed)
Pt reports she had 5 teeth pulled yesterday (Thursday) and states her gums have been bleeding ever since.  Pt is on eliquis

## 2019-03-21 ENCOUNTER — Other Ambulatory Visit: Payer: Self-pay

## 2019-03-21 ENCOUNTER — Ambulatory Visit (HOSPITAL_COMMUNITY): Payer: BC Managed Care – PPO | Admitting: Hematology

## 2019-03-21 DIAGNOSIS — L03311 Cellulitis of abdominal wall: Secondary | ICD-10-CM | POA: Diagnosis not present

## 2019-03-21 DIAGNOSIS — F329 Major depressive disorder, single episode, unspecified: Secondary | ICD-10-CM | POA: Diagnosis not present

## 2019-03-21 DIAGNOSIS — I5033 Acute on chronic diastolic (congestive) heart failure: Secondary | ICD-10-CM | POA: Diagnosis not present

## 2019-03-21 DIAGNOSIS — S31109D Unspecified open wound of abdominal wall, unspecified quadrant without penetration into peritoneal cavity, subsequent encounter: Secondary | ICD-10-CM | POA: Diagnosis not present

## 2019-03-21 DIAGNOSIS — E1165 Type 2 diabetes mellitus with hyperglycemia: Secondary | ICD-10-CM | POA: Diagnosis not present

## 2019-03-21 DIAGNOSIS — Z87440 Personal history of urinary (tract) infections: Secondary | ICD-10-CM | POA: Diagnosis not present

## 2019-03-21 DIAGNOSIS — N183 Chronic kidney disease, stage 3 (moderate): Secondary | ICD-10-CM | POA: Diagnosis not present

## 2019-03-21 DIAGNOSIS — I482 Chronic atrial fibrillation, unspecified: Secondary | ICD-10-CM | POA: Diagnosis not present

## 2019-03-21 DIAGNOSIS — J449 Chronic obstructive pulmonary disease, unspecified: Secondary | ICD-10-CM | POA: Diagnosis not present

## 2019-03-21 DIAGNOSIS — Z6841 Body Mass Index (BMI) 40.0 and over, adult: Secondary | ICD-10-CM | POA: Diagnosis not present

## 2019-03-21 DIAGNOSIS — E1122 Type 2 diabetes mellitus with diabetic chronic kidney disease: Secondary | ICD-10-CM | POA: Diagnosis not present

## 2019-03-21 DIAGNOSIS — I130 Hypertensive heart and chronic kidney disease with heart failure and stage 1 through stage 4 chronic kidney disease, or unspecified chronic kidney disease: Secondary | ICD-10-CM | POA: Diagnosis not present

## 2019-03-21 NOTE — Patient Outreach (Signed)
[facility]) Care Management  03/21/2019  [name] Sep 07, 1950 [id number]     Current Outpatient Medications on File Prior to Visit  Medication Sig Dispense Refill  . acetaminophen (TYLENOL) 500 MG tablet Take 500-1,000 mg by mouth every 8 (eight) hours as needed for mild pain or headache.     . albuterol (PROVENTIL HFA;VENTOLIN HFA) 108 (90 Base) MCG/ACT inhaler Inhale 2 puffs into the lungs every 6 (six) hours as needed for wheezing or shortness of breath. (Patient taking differently: Inhale 2 puffs into the lungs every 6 (six) hours as needed for wheezing or shortness of breath (As needed). ) 1 Inhaler 2  . apixaban (ELIQUIS) 5 MG TABS tablet Take 1 tablet (5 mg total) by mouth 2 (two) times daily. 60 tablet 3  . buPROPion (WELLBUTRIN XL) 300 MG 24 hr tablet Take 300 mg by mouth daily.     Marland Kitchen diltiazem (CARDIZEM CD) 120 MG 24 hr capsule Take 1 capsule (120 mg total) by mouth daily. (Patient taking differently: Take 120 mg by mouth daily. Cartia XT)    . escitalopram (LEXAPRO) 10 MG tablet Take 10 mg by mouth daily.    . ferrous sulfate (FERROUSUL) 325 (65 FE) MG tablet Take 1 tablet (325 mg total) by mouth 2 (two) times daily with a meal. 60 tablet 0  . fluticasone (FLONASE) 50 MCG/ACT nasal spray Place 2 sprays into both nostrils daily as needed for allergies or rhinitis (congestion).     . furosemide (LASIX) 20 MG tablet Take 2 tablets (40 mg total) by mouth 2 (two) times daily. 120 tablet 0  . glipiZIDE (GLUCOTROL) 10 MG tablet Take 10 mg by mouth 2 (two) times daily.  2  . levalbuterol (XOPENEX) 0.63 MG/3ML nebulizer solution Take 0.63 mg by nebulization every 6 (six) hours as needed for wheezing or shortness of breath.     Marland Kitchen LEVEMIR FLEXTOUCH 100 UNIT/ML Pen Inject 10 Units into the skin 2 (two) times a day.    . metoprolol tartrate (LOPRESSOR) 50 MG tablet Take 50 mg by mouth 2 (two) times daily.     Marland Kitchen nystatin (MYCOSTATIN/NYSTOP) powder Apply topically daily as  needed (rash in skin folds).    Marland Kitchen OVER THE COUNTER MEDICATION Apply 1 application topically daily as needed (rash in skin folds). Baby butt paste    . oxyCODONE (OXY IR/ROXICODONE) 5 MG immediate release tablet Take 1 tablet (5 mg total) by mouth every 6 (six) hours as needed for severe pain. 25 tablet 0  . pantoprazole (PROTONIX) 40 MG tablet Take 1 tablet (40 mg total) by mouth daily before breakfast. (Patient taking differently: Take 40 mg by mouth daily as needed (for GERD/acid reflux). )    . predniSONE (DELTASONE) 20 MG tablet Take 3 tablets (60 mg total) by mouth daily with breakfast. 90 tablet 0  . saccharomyces boulardii (FLORASTOR) 250 MG capsule Take 1 capsule (250 mg total) by mouth 2 (two) times daily. 60 capsule 0   No current facility-administered medications on file prior to visit.      THN CM Care Plan Problem One     Most Recent Value  Care Plan Problem One  Risk for Readmission  Role Documenting the Problem One  Care Management [place] for Problem One  Active  THN Long Term Goal   Patient will not be hospitalized over the next 90 days.  THN Long Term Goal Start Date  02/16/19  Interventions for Problem One Long Term  Goal  Patient was admitted from 03/03/19-03/11/19  due to wound infection/pyoderma gangrenosum. Discussed symptoms and compliance with treatment recommendations since returning home. Patient reports doing well but required ED evaluation on 03/18/19 for gingival bleeding. Encouraged to continue taking medications as prescribed and monitor blood glucose levels as recommended. Discussed worsening s/sx and indications for seeking immediate medical attention.   THN CM Short Term Goal #1   Over the next 30 days patient will take all medications as prescribed.   THN CM Short Term Goal #1 Start Date  02/16/19  THN CM Short Term Goal #1 Met Date  03/21/19  THN CM Short Term Goal #2   Over the next 30 days patient will monitor abdominal wounds and reports s/sx  of infection.  THN CM Short Term Goal #2 Start Date  03/21/19 [Goal Reset]  Interventions for Short Term Goal #2  Reviewed s/sx of infection and indication for seeking immediate medical attention. Patient confirmed resumption of home health services and wound vac change 3 times a week.  THN CM Short Term Goal #3  Over the next 30 days, patient will monitor weights, BP and blood sugar levels daily.  THN CM Short Term Goal #3 Start Date  03/21/19 [Goal Reset]  Interventions for Short Tern Goal #3  Reviewed new parameters for blood sugar readings due to recently prescribed prednisone. Encouraged to utilize sliding scale as recommended. Reviewed recommended interventions for  hypoglycemia and hyperglycemia along with  indications for seeking immediate medical attention.      PLAN -[name] is pending follow-up with surgical team on 03/23/19. Declines need for transportation assistance. -Will follow-up within two weeks.   Sharon [phone]

## 2019-03-22 DIAGNOSIS — L88 Pyoderma gangrenosum: Secondary | ICD-10-CM | POA: Diagnosis not present

## 2019-03-22 DIAGNOSIS — L03311 Cellulitis of abdominal wall: Secondary | ICD-10-CM | POA: Diagnosis not present

## 2019-03-22 DIAGNOSIS — E1165 Type 2 diabetes mellitus with hyperglycemia: Secondary | ICD-10-CM | POA: Diagnosis not present

## 2019-03-22 DIAGNOSIS — T8141XA Infection following a procedure, superficial incisional surgical site, initial encounter: Secondary | ICD-10-CM | POA: Diagnosis not present

## 2019-03-24 ENCOUNTER — Other Ambulatory Visit: Payer: Self-pay

## 2019-03-24 ENCOUNTER — Ambulatory Visit (INDEPENDENT_AMBULATORY_CARE_PROVIDER_SITE_OTHER): Payer: Self-pay | Admitting: General Surgery

## 2019-03-24 ENCOUNTER — Encounter: Payer: Self-pay | Admitting: General Surgery

## 2019-03-24 VITALS — BP 161/75 | HR 50 | Temp 96.4°F | Resp 18 | Ht 64.0 in | Wt 194.0 lb

## 2019-03-24 DIAGNOSIS — S31109D Unspecified open wound of abdominal wall, unspecified quadrant without penetration into peritoneal cavity, subsequent encounter: Secondary | ICD-10-CM

## 2019-03-24 DIAGNOSIS — L089 Local infection of the skin and subcutaneous tissue, unspecified: Secondary | ICD-10-CM

## 2019-03-24 DIAGNOSIS — L88 Pyoderma gangrenosum: Secondary | ICD-10-CM

## 2019-03-24 NOTE — Patient Instructions (Signed)
Continue wound vacuum change with Wound RN. Take prednisone.  Follow up with PCP regarding pyoderma gangrenosum. Go to Dermatology if PCP does not want to handle prednisone management.

## 2019-03-24 NOTE — Progress Notes (Signed)
[facility] Note   HPI:  68 y.o. Female presents to clinic for post-op follow-up evaluation of of her wound and wound vacuum. She reports having some pain with the changes but overall doing well. No major complaints. She has been getting dental appt and has a bruised face from getting dental work.   Review of Systems:  No drain or erythema at wound Dental work All other review of systems: otherwise negative   Vital Signs:  BP (!) 161/75 (BP Location: Left Arm, Patient Position: Sitting, Cuff Size: Normal)   Pulse (!) 50   Temp (!) 96.4 F (35.8 C) (Tympanic)   Resp 18   Ht 5' 4"  (1.626 m)   Wt 194 lb (88 kg)   SpO2 90%   BMI 33.30 kg/m    Physical Exam:  Physical Exam Vitals signs reviewed.  HENT:     Head: Normocephalic.  Pulmonary:     Effort: Pulmonary effort is normal.  Abdominal:     Comments: Wound with some minor granulation and fatty tissue, no drainage, no erythema, replaced wound vacuum (attempted to take a photo but this was accidentally in someone else's chart. Will notify IT today).   Skin:    General: Skin is warm.  Neurological:     Mental Status: She is alert.     Assessment:  68 y.o. 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 She is on steroids from the hosptial for this and was suppose to see her PCP/ dermatology regarding the steroid dose/ tapering. She is suppose to be on steroids for at least 6 weeks based on my reading.  I am managing the wound but no the steroids. Told her she needs to follow up with the other physicians to get this figured out. The steroids are to prevent worsening of pyoderma gangrenosum. The wound is looking ok but only minimal granulation, likely from the steroids.   Plan:  -Continue wound vacuum changes three times a week    Future Appointments  Date Time Provider [place]  04/04/2019  1:00 PM Imogene Burn, PA-C [facility] [place] [facility]  04/08/2019 11:30 AM  Neldon Labella, RN [facility] None  04/21/2019  1:30 PM Virl Cagey, MD [facility] None     All of the above recommendations were discussed with the patient, and all of patient's questions were answered to, and she expressed satisfaction.  Curlene Labrum, MD [facility] [address] [place], [place] [address] [phone] (office)

## 2019-03-25 DIAGNOSIS — N183 Chronic kidney disease, stage 3 (moderate): Secondary | ICD-10-CM | POA: Diagnosis not present

## 2019-03-25 DIAGNOSIS — I482 Chronic atrial fibrillation, unspecified: Secondary | ICD-10-CM | POA: Diagnosis not present

## 2019-03-25 DIAGNOSIS — I5033 Acute on chronic diastolic (congestive) heart failure: Secondary | ICD-10-CM | POA: Diagnosis not present

## 2019-03-25 DIAGNOSIS — J449 Chronic obstructive pulmonary disease, unspecified: Secondary | ICD-10-CM | POA: Diagnosis not present

## 2019-03-25 DIAGNOSIS — Z87440 Personal history of urinary (tract) infections: Secondary | ICD-10-CM | POA: Diagnosis not present

## 2019-03-25 DIAGNOSIS — L03311 Cellulitis of abdominal wall: Secondary | ICD-10-CM | POA: Diagnosis not present

## 2019-03-25 DIAGNOSIS — E1122 Type 2 diabetes mellitus with diabetic chronic kidney disease: Secondary | ICD-10-CM | POA: Diagnosis not present

## 2019-03-25 DIAGNOSIS — F329 Major depressive disorder, single episode, unspecified: Secondary | ICD-10-CM | POA: Diagnosis not present

## 2019-03-25 DIAGNOSIS — Z6841 Body Mass Index (BMI) 40.0 and over, adult: Secondary | ICD-10-CM | POA: Diagnosis not present

## 2019-03-25 DIAGNOSIS — S31109D Unspecified open wound of abdominal wall, unspecified quadrant without penetration into peritoneal cavity, subsequent encounter: Secondary | ICD-10-CM | POA: Diagnosis not present

## 2019-03-25 DIAGNOSIS — I130 Hypertensive heart and chronic kidney disease with heart failure and stage 1 through stage 4 chronic kidney disease, or unspecified chronic kidney disease: Secondary | ICD-10-CM | POA: Diagnosis not present

## 2019-03-25 DIAGNOSIS — E1165 Type 2 diabetes mellitus with hyperglycemia: Secondary | ICD-10-CM | POA: Diagnosis not present

## 2019-03-28 DIAGNOSIS — E1165 Type 2 diabetes mellitus with hyperglycemia: Secondary | ICD-10-CM | POA: Diagnosis not present

## 2019-03-28 DIAGNOSIS — Z6841 Body Mass Index (BMI) 40.0 and over, adult: Secondary | ICD-10-CM | POA: Diagnosis not present

## 2019-03-28 DIAGNOSIS — S31109D Unspecified open wound of abdominal wall, unspecified quadrant without penetration into peritoneal cavity, subsequent encounter: Secondary | ICD-10-CM | POA: Diagnosis not present

## 2019-03-28 DIAGNOSIS — E1122 Type 2 diabetes mellitus with diabetic chronic kidney disease: Secondary | ICD-10-CM | POA: Diagnosis not present

## 2019-03-28 DIAGNOSIS — N183 Chronic kidney disease, stage 3 (moderate): Secondary | ICD-10-CM | POA: Diagnosis not present

## 2019-03-28 DIAGNOSIS — J449 Chronic obstructive pulmonary disease, unspecified: Secondary | ICD-10-CM | POA: Diagnosis not present

## 2019-03-28 DIAGNOSIS — Z87440 Personal history of urinary (tract) infections: Secondary | ICD-10-CM | POA: Diagnosis not present

## 2019-03-28 DIAGNOSIS — L03311 Cellulitis of abdominal wall: Secondary | ICD-10-CM | POA: Diagnosis not present

## 2019-03-28 DIAGNOSIS — I130 Hypertensive heart and chronic kidney disease with heart failure and stage 1 through stage 4 chronic kidney disease, or unspecified chronic kidney disease: Secondary | ICD-10-CM | POA: Diagnosis not present

## 2019-03-28 DIAGNOSIS — I5033 Acute on chronic diastolic (congestive) heart failure: Secondary | ICD-10-CM | POA: Diagnosis not present

## 2019-03-28 DIAGNOSIS — I482 Chronic atrial fibrillation, unspecified: Secondary | ICD-10-CM | POA: Diagnosis not present

## 2019-03-28 DIAGNOSIS — F329 Major depressive disorder, single episode, unspecified: Secondary | ICD-10-CM | POA: Diagnosis not present

## 2019-03-30 ENCOUNTER — Telehealth: Payer: Self-pay | Admitting: General Surgery

## 2019-03-30 DIAGNOSIS — I5033 Acute on chronic diastolic (congestive) heart failure: Secondary | ICD-10-CM | POA: Diagnosis not present

## 2019-03-30 DIAGNOSIS — F329 Major depressive disorder, single episode, unspecified: Secondary | ICD-10-CM | POA: Diagnosis not present

## 2019-03-30 DIAGNOSIS — Z6841 Body Mass Index (BMI) 40.0 and over, adult: Secondary | ICD-10-CM | POA: Diagnosis not present

## 2019-03-30 DIAGNOSIS — L03311 Cellulitis of abdominal wall: Secondary | ICD-10-CM | POA: Diagnosis not present

## 2019-03-30 DIAGNOSIS — I482 Chronic atrial fibrillation, unspecified: Secondary | ICD-10-CM | POA: Diagnosis not present

## 2019-03-30 DIAGNOSIS — Z87440 Personal history of urinary (tract) infections: Secondary | ICD-10-CM | POA: Diagnosis not present

## 2019-03-30 DIAGNOSIS — E1122 Type 2 diabetes mellitus with diabetic chronic kidney disease: Secondary | ICD-10-CM | POA: Diagnosis not present

## 2019-03-30 DIAGNOSIS — S31109D Unspecified open wound of abdominal wall, unspecified quadrant without penetration into peritoneal cavity, subsequent encounter: Secondary | ICD-10-CM | POA: Diagnosis not present

## 2019-03-30 DIAGNOSIS — I130 Hypertensive heart and chronic kidney disease with heart failure and stage 1 through stage 4 chronic kidney disease, or unspecified chronic kidney disease: Secondary | ICD-10-CM | POA: Diagnosis not present

## 2019-03-30 DIAGNOSIS — J449 Chronic obstructive pulmonary disease, unspecified: Secondary | ICD-10-CM | POA: Diagnosis not present

## 2019-03-30 DIAGNOSIS — E1165 Type 2 diabetes mellitus with hyperglycemia: Secondary | ICD-10-CM | POA: Diagnosis not present

## 2019-03-30 DIAGNOSIS — N183 Chronic kidney disease, stage 3 (moderate): Secondary | ICD-10-CM | POA: Diagnosis not present

## 2019-03-30 MED ORDER — SANTYL 250 UNIT/GM EX OINT: 1.0000 "application " | TOPICAL_OINTMENT | Freq: Two times a day (BID) | CUTANEOUS | 1 refills | Status: DC

## 2019-03-30 NOTE — Telephone Encounter (Signed)
[facility]  Sarah, [facility], called. She is worried about the fibrinous tissue on the edges of the wound and continuing the wound vacuum. She has probed the wound and has no pockets that are forming.   Santyl twice daily with damp to dry saline dressing changes. Judson Roch will change the orders.  [facility].   Curlene Labrum, MD [facility] [address] [place], [place] [address] [phone] (office)

## 2019-03-30 NOTE — Progress Notes (Signed)
Virtual Visit via Telephone Note   This visit type was conducted due to national recommendations for restrictions regarding the COVID-19 Pandemic (e.g. social distancing) in an effort to limit this patient's exposure and mitigate transmission in our community.  Due to her co-morbid illnesses, this patient is at least at moderate risk for complications without adequate follow up.  This format is felt to be most appropriate for this patient at this time.  The patient did not have access to video technology/had technical difficulties with video requiring transitioning to audio format only (telephone).  All issues noted in this document were discussed and addressed.  No physical exam could be performed with this format.  Please refer to the patient's chart for her  consent to telehealth for [facility].   Date:  04/04/2019   ID:  Katrina, Torres Nov 01, 1950, MRN [id number]  Patient Location: Home Provider Location: Home  PCP:  [name], MD  Cardiologist:  Kate Sable, MD  Electrophysiologist:  None   Evaluation Performed:  Follow-Up Visit  Chief Complaint:   Hospital f/u  History of Present Illness:    [name] is a 68 y.o. female with with a history of chronic diastolic CHF, atrial fibrillation on Eliquis, hypertension, type 2 diabetes mellitus, chronic UTI, chronic non-healing abdominal wound, and morbid obesity.  She was seen in the hospital 12/2018 for acute on chronic diastolic CHF, bradycardia which resolved off diltiazem and metoprolol.  She does have PAF and was continued on Eliquis 5 mg twice daily.  EKG had T wave inversion anteriorly but significance is unclear since her older EKG she was in rapid atrial fib.  Echo LVEF 50 to 55% with no wall motion abnormalities troponins were elevated but flat.  It was not felt to be due to ACS but possibly demand ischemia secondary to CHF & bradycardia. She is back on low dose diltiazem and metoprolol.  Had  wound surgery on her abdomen for chronic abdominal ulcers with necrotic fat exposed and abdominal wound infection 03/09/19. She is still recovering from that surgery.Also raising her 61 yr old grand daughter.  Denies palpitations. HR in 50-60's on low dose diltiazem and metoprolol. Denies dizziness, presyncope, palpitations, chest pain leg swelling.  She checks her weight and vital signs every day.   The patient does not have symptoms concerning for COVID-19 infection (fever, chills, cough, or new shortness of breath).    Past Medical History:  Diagnosis Date  . Atrial fibrillation ([place])   . CHF (congestive heart failure) ([place])   . Depression   . Diabetes mellitus without complication ([place])   . 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 Hypertension   . Morbid obesity ([place])   . Osteoporosis   . Urinary incontinence   . UTI (lower urinary tract infection) 01/2016   Cipro for Klebsiella pneumoniae isolate  . Wheelchair bound    bedbound   Past Surgical History:  Procedure Laterality Date  . ABDOMINAL HYSTERECTOMY    . APPLICATION OF WOUND VAC  03/09/2019   Procedure: APPLICATION OF WOUND VAC;  Surgeon: Virl Cagey, MD;  Location: [facility];  Service: General;;  . BIOPSY  09/06/2018   Procedure: BIOPSY;  Surgeon: Danie Binder, MD;  Location[facility];  Service: Endoscopy;;  gastric bx's  . CESAREAN SECTION    . CHOLECYSTECTOMY    . ESOPHAGEAL DILATION  09/06/2018   Procedure: ESOPHAGEAL DILATION;  Surgeon: Barney Drain  L, MD;  Location: [facility];  Service: Endoscopy;;  . ESOPHAGOGASTRODUODENOSCOPY (EGD) WITH PROPOFOL N/A 09/06/2018   Procedure: ESOPHAGOGASTRODUODENOSCOPY (EGD) WITH PROPOFOL;  Surgeon: Danie Binder, MD;  [facility];  Service: Endoscopy;  Laterality: N/A;  dilatation  . FEMUR IM NAIL Left 02/20/2016  . FEMUR IM NAIL Left 02/20/2016   Procedure: INTRAMEDULLARY (IM) RETROGRADE FEMORAL NAILING;  Surgeon: Leandrew Koyanagi, MD;  Location: [place];  Service: Orthopedics;  Laterality: Left;  . PANCREAS SURGERY  1967   1 cyst excised and one cyst drained  . TEE WITHOUT CARDIOVERSION N/A 04/05/2018   Procedure: TRANSESOPHAGEAL ECHOCARDIOGRAM (TEE);  Surgeon: Dorothy Spark, MD;  Location: [facility];  Service: Cardiovascular;  Laterality: N/A;  . [place]    . WOUND DEBRIDEMENT N/A 03/09/2019   Procedure: EXCISIONAL DEBRIDEMENT OF ABDOMINAL WOUND ULCERS;  Surgeon: Virl Cagey, MD;  Location: [facility];  Service: General;  Laterality: N/A;  . WRIST FRACTURE SURGERY       Current Meds  Medication Sig  . acetaminophen (TYLENOL) 500 MG tablet Take 500-1,000 mg by mouth every 8 (eight) hours as needed for mild pain or headache.   . albuterol (PROVENTIL HFA;VENTOLIN HFA) 108 (90 Base) MCG/ACT inhaler Inhale 2 puffs into the lungs every 6 (six) hours as needed for wheezing or shortness of breath. (Patient taking differently: Inhale 2 puffs into the lungs every 6 (six) hours as needed for wheezing or shortness of breath (As needed). )  . apixaban (ELIQUIS) 5 MG TABS tablet Take 1 tablet (5 mg total) by mouth 2 (two) times daily.  Marland Kitchen buPROPion (WELLBUTRIN XL) 300 MG 24 hr tablet Take 300 mg by mouth daily.   . collagenase (SANTYL) ointment Apply 1 application topically 2 (two) times daily. Twice daily applications to the abdominal wound with damp to dry saline dressings  . diltiazem (CARDIZEM CD) 120 MG 24 hr capsule Take 1 capsule (120 mg total) by mouth daily. (Patient taking differently: Take 120 mg by mouth daily. Cartia XT)  . escitalopram (LEXAPRO) 10 MG tablet Take 10 mg by mouth daily.  . ferrous sulfate (FERROUSUL) 325 (65 FE) MG tablet Take 1 tablet (325 mg total) by mouth 2 (two) times daily with a meal.  . fluticasone (FLONASE) 50 MCG/ACT nasal spray Place 2 sprays into both nostrils daily as needed for allergies or rhinitis (congestion).   . furosemide (LASIX) 20 MG tablet Take 2  tablets (40 mg total) by mouth 2 (two) times daily.  Marland Kitchen glipiZIDE (GLUCOTROL) 10 MG tablet Take 10 mg by mouth 2 (two) times daily.  Marland Kitchen levalbuterol (XOPENEX) 0.63 MG/3ML nebulizer solution Take 0.63 mg by nebulization every 6 (six) hours as needed for wheezing or shortness of breath.   Marland Kitchen LEVEMIR FLEXTOUCH 100 UNIT/ML Pen Inject 10 Units into the skin 2 (two) times a day.  . metoprolol tartrate (LOPRESSOR) 50 MG tablet Take 50 mg by mouth 2 (two) times daily.   Marland Kitchen nystatin (MYCOSTATIN/NYSTOP) powder Apply topically daily as needed (rash in skin folds).  Marland Kitchen OVER THE COUNTER MEDICATION Apply 1 application topically daily as needed (rash in skin folds). Baby butt paste  . oxyCODONE (OXY IR/ROXICODONE) 5 MG immediate release tablet Take 1 tablet (5 mg total) by mouth every 6 (six) hours as needed for severe pain.  . pantoprazole (PROTONIX) 40 MG tablet Take 1 tablet (40 mg total) by mouth daily before breakfast. (Patient taking differently: Take 40 mg by mouth daily as  needed (for GERD/acid reflux). )  . predniSONE (DELTASONE) 20 MG tablet Take 3 tablets (60 mg total) by mouth daily with breakfast.  . saccharomyces boulardii (FLORASTOR) 250 MG capsule Take 1 capsule (250 mg total) by mouth 2 (two) times daily.     Allergies:   Feraheme [ferumoxytol], Propranolol, Topamax [topiramate], and Latex   Social History   Tobacco Use  . Smoking status: Never Smoker  . Smokeless tobacco: Never Used  Substance Use Topics  . Alcohol use: No  . Drug use: No     Family Hx: The patient's family history includes Breast cancer in her maternal aunt and mother; Cancer in her mother; Diabetes in her brother, father, maternal grandfather, maternal grandmother, mother, paternal grandfather, and paternal grandmother; Stroke in her brother, father, and mother.  ROS:   Please see the history of present illness.      All other systems reviewed and are negative.   Prior CV studies:   The following studies were  reviewed today:  Echocardiogram 01/12/2019: Impressions: 1. The left ventricle has low normal systolic function, with an ejection fraction of 50-55%. The cavity size was normal. There is mild concentric left ventricular hypertrophy. Left ventricular diastolic Doppler parameters are consistent with  pseudonormalization. Elevated mean left atrial pressure There is right ventricular volume overload. No evidence of left ventricular regional wall motion abnormalities.  2. The right ventricle has normal systolic function. The cavity was normal. There is no increase in right ventricular wall thickness. Right ventricular systolic pressure is mildly elevated with an estimated pressure of 47.9 mmHg.  3. Left atrial size was moderately dilated.  4. The mitral valve is degenerative. Mild thickening of the mitral valve leaflet. There is severe mitral annular calcification present. Possible very mild mitral valve stenosis (normal gradients at slow heart rate of 58 bpm).  5. The tricuspid valve is grossly normal. Tricuspid valve regurgitation is moderate-severe.  6. The aortic valve is tricuspid. Moderate thickening of the aortic valve. Mild calcification of the aortic valve.  7. The aortic root and ascending aorta are normal in size and structure.      Labs/Other Tests and Data Reviewed:    EKG:  An ECG dated 02/18/19 was personally reviewed today and demonstrated:  Sinus bradycardia 54/m with resolution of anterior TWI from 01/11/19  Recent Labs: 02/17/2019: B Natriuretic Peptide 1,510.0 03/10/2019: ALT 8; Magnesium 1.6 03/18/2019: BUN 33; Creatinine, Ser 1.00; Hemoglobin 8.4; Platelets 419; Potassium 3.8; Sodium 133   Recent Lipid Panel Lab Results  Component Value Date/Time   CHOL 98 04/04/2018 03:50 AM   TRIG 90 04/04/2018 03:50 AM   HDL 50 04/04/2018 03:50 AM   CHOLHDL 2.0 04/04/2018 03:50 AM   LDLCALC 30 04/04/2018 03:50 AM    Wt Readings from Last 3 Encounters:  04/04/19 198 lb 9.6 oz (90.1 kg)   03/24/19 194 lb (88 kg)  03/18/19 199 lb (90.3 kg)     Objective:    Vital Signs:  BP 125/76   Pulse (!) 52   Ht 5' 4"  (1.626 m)   Wt 198 lb 9.6 oz (90.1 kg)   SpO2 97%   BMI 34.09 kg/m    VITAL SIGNS:  reviewed  ASSESSMENT & PLAN:    Chronic diastolic CHF had admission with acute on chronic diastolic CHF 01/4331 2D echo showed normal LVEF 50 to 55%-no problems since and patient weighing herself daily and monitoring her vitals.  PAF on Eliquis.  Diltiazem and metoprolol stopped in May because of  bradycardia but she is back on lower dose. HR in 50-60's when she checks it daily. No changes at this time. In office visit in 3-4 months with Dr. Bronson Ing  Essential hypertension BP stable  CKD stage III and I.00 03/18/19    COVID-19 Education: The signs and symptoms of COVID-19 were discussed with the patient and how to seek care for testing (follow up with PCP or arrange E-visit).  The importance of social distancing was discussed today.  Time:   Today, I have spent 11 minutes with the patient with telehealth technology discussing the above problems.     Medication Adjustments/Labs and Tests Ordered: Current medicines are reviewed at length with the patient today.  Concerns regarding medicines are outlined above.   Tests Ordered: No orders of the defined types were placed in this encounter.   Medication Changes: No orders of the defined types were placed in this encounter.   Follow Up:  In Person in 4 month(s) Dr. Bronson Ing  Signed, Ermalinda Barrios, PA-C  04/04/2019 1:28 PM    [facility]  Medication Adjustments/Labs and Tests Ordered: Current medicines are reviewed at length with the patient today.  Concerns regarding medicines are outlined above.  Medication changes, Labs and Tests ordered today are listed in the Patient Instructions below. There are no Patient Instructions on file for this visit.   Signed, Ermalinda Barrios, PA-C  04/04/2019  1:28 PM    [facility] [place], [place], [place]  [address] Phone: [phone]; Fax: [phone]

## 2019-04-01 DIAGNOSIS — Z87440 Personal history of urinary (tract) infections: Secondary | ICD-10-CM | POA: Diagnosis not present

## 2019-04-01 DIAGNOSIS — E1165 Type 2 diabetes mellitus with hyperglycemia: Secondary | ICD-10-CM | POA: Diagnosis not present

## 2019-04-01 DIAGNOSIS — I482 Chronic atrial fibrillation, unspecified: Secondary | ICD-10-CM | POA: Diagnosis not present

## 2019-04-01 DIAGNOSIS — I130 Hypertensive heart and chronic kidney disease with heart failure and stage 1 through stage 4 chronic kidney disease, or unspecified chronic kidney disease: Secondary | ICD-10-CM | POA: Diagnosis not present

## 2019-04-01 DIAGNOSIS — J449 Chronic obstructive pulmonary disease, unspecified: Secondary | ICD-10-CM | POA: Diagnosis not present

## 2019-04-01 DIAGNOSIS — F329 Major depressive disorder, single episode, unspecified: Secondary | ICD-10-CM | POA: Diagnosis not present

## 2019-04-01 DIAGNOSIS — S31109D Unspecified open wound of abdominal wall, unspecified quadrant without penetration into peritoneal cavity, subsequent encounter: Secondary | ICD-10-CM | POA: Diagnosis not present

## 2019-04-01 DIAGNOSIS — I5033 Acute on chronic diastolic (congestive) heart failure: Secondary | ICD-10-CM | POA: Diagnosis not present

## 2019-04-01 DIAGNOSIS — N183 Chronic kidney disease, stage 3 (moderate): Secondary | ICD-10-CM | POA: Diagnosis not present

## 2019-04-01 DIAGNOSIS — Z6841 Body Mass Index (BMI) 40.0 and over, adult: Secondary | ICD-10-CM | POA: Diagnosis not present

## 2019-04-01 DIAGNOSIS — L03311 Cellulitis of abdominal wall: Secondary | ICD-10-CM | POA: Diagnosis not present

## 2019-04-01 DIAGNOSIS — E1122 Type 2 diabetes mellitus with diabetic chronic kidney disease: Secondary | ICD-10-CM | POA: Diagnosis not present

## 2019-04-04 ENCOUNTER — Telehealth (INDEPENDENT_AMBULATORY_CARE_PROVIDER_SITE_OTHER): Payer: BC Managed Care – PPO | Admitting: Physician Assistant

## 2019-04-04 VITALS — BP 125/76 | HR 52 | Ht 64.0 in | Wt 198.6 lb

## 2019-04-04 DIAGNOSIS — F329 Major depressive disorder, single episode, unspecified: Secondary | ICD-10-CM | POA: Diagnosis not present

## 2019-04-04 DIAGNOSIS — I5032 Chronic diastolic (congestive) heart failure: Secondary | ICD-10-CM

## 2019-04-04 DIAGNOSIS — Z6841 Body Mass Index (BMI) 40.0 and over, adult: Secondary | ICD-10-CM | POA: Diagnosis not present

## 2019-04-04 DIAGNOSIS — I480 Paroxysmal atrial fibrillation: Secondary | ICD-10-CM | POA: Diagnosis not present

## 2019-04-04 DIAGNOSIS — I482 Chronic atrial fibrillation, unspecified: Secondary | ICD-10-CM | POA: Diagnosis not present

## 2019-04-04 DIAGNOSIS — I130 Hypertensive heart and chronic kidney disease with heart failure and stage 1 through stage 4 chronic kidney disease, or unspecified chronic kidney disease: Secondary | ICD-10-CM | POA: Diagnosis not present

## 2019-04-04 DIAGNOSIS — L03311 Cellulitis of abdominal wall: Secondary | ICD-10-CM | POA: Diagnosis not present

## 2019-04-04 DIAGNOSIS — E1122 Type 2 diabetes mellitus with diabetic chronic kidney disease: Secondary | ICD-10-CM | POA: Diagnosis not present

## 2019-04-04 DIAGNOSIS — S31109D Unspecified open wound of abdominal wall, unspecified quadrant without penetration into peritoneal cavity, subsequent encounter: Secondary | ICD-10-CM | POA: Diagnosis not present

## 2019-04-04 DIAGNOSIS — J449 Chronic obstructive pulmonary disease, unspecified: Secondary | ICD-10-CM | POA: Diagnosis not present

## 2019-04-04 DIAGNOSIS — I5033 Acute on chronic diastolic (congestive) heart failure: Secondary | ICD-10-CM | POA: Diagnosis not present

## 2019-04-04 DIAGNOSIS — I10 Essential (primary) hypertension: Secondary | ICD-10-CM

## 2019-04-04 DIAGNOSIS — E1165 Type 2 diabetes mellitus with hyperglycemia: Secondary | ICD-10-CM | POA: Diagnosis not present

## 2019-04-04 DIAGNOSIS — N183 Chronic kidney disease, stage 3 unspecified: Secondary | ICD-10-CM

## 2019-04-04 DIAGNOSIS — Z87440 Personal history of urinary (tract) infections: Secondary | ICD-10-CM | POA: Diagnosis not present

## 2019-04-04 NOTE — Patient Instructions (Signed)
Medication Instructions:  Your physician recommends that you continue on your current medications as directed. Please refer to the Current Medication list given to you today.   Labwork: NONE  Testing/Procedures: NONE  Follow-Up: Your physician recommends that you schedule a follow-up appointment in: 3-4 Months with Dr. Bronson Ing    Any Other Special Instructions Will Be Listed Below (If Applicable).     If you need a refill on your cardiac medications before your next appointment, please call your pharmacy.  Thank you for choosing [facility]!

## 2019-04-05 DIAGNOSIS — L03311 Cellulitis of abdominal wall: Secondary | ICD-10-CM | POA: Diagnosis not present

## 2019-04-05 DIAGNOSIS — T8141XA Infection following a procedure, superficial incisional surgical site, initial encounter: Secondary | ICD-10-CM | POA: Diagnosis not present

## 2019-04-05 DIAGNOSIS — L88 Pyoderma gangrenosum: Secondary | ICD-10-CM | POA: Diagnosis not present

## 2019-04-05 DIAGNOSIS — E1165 Type 2 diabetes mellitus with hyperglycemia: Secondary | ICD-10-CM | POA: Diagnosis not present

## 2019-04-06 ENCOUNTER — Telehealth: Payer: Self-pay | Admitting: General Surgery

## 2019-04-06 DIAGNOSIS — J449 Chronic obstructive pulmonary disease, unspecified: Secondary | ICD-10-CM | POA: Diagnosis not present

## 2019-04-06 DIAGNOSIS — S31109D Unspecified open wound of abdominal wall, unspecified quadrant without penetration into peritoneal cavity, subsequent encounter: Secondary | ICD-10-CM | POA: Diagnosis not present

## 2019-04-06 DIAGNOSIS — N183 Chronic kidney disease, stage 3 (moderate): Secondary | ICD-10-CM | POA: Diagnosis not present

## 2019-04-06 DIAGNOSIS — F329 Major depressive disorder, single episode, unspecified: Secondary | ICD-10-CM | POA: Diagnosis not present

## 2019-04-06 DIAGNOSIS — I482 Chronic atrial fibrillation, unspecified: Secondary | ICD-10-CM | POA: Diagnosis not present

## 2019-04-06 DIAGNOSIS — I5033 Acute on chronic diastolic (congestive) heart failure: Secondary | ICD-10-CM | POA: Diagnosis not present

## 2019-04-06 DIAGNOSIS — E1122 Type 2 diabetes mellitus with diabetic chronic kidney disease: Secondary | ICD-10-CM | POA: Diagnosis not present

## 2019-04-06 DIAGNOSIS — E1165 Type 2 diabetes mellitus with hyperglycemia: Secondary | ICD-10-CM | POA: Diagnosis not present

## 2019-04-06 DIAGNOSIS — Z87440 Personal history of urinary (tract) infections: Secondary | ICD-10-CM | POA: Diagnosis not present

## 2019-04-06 DIAGNOSIS — Z6841 Body Mass Index (BMI) 40.0 and over, adult: Secondary | ICD-10-CM | POA: Diagnosis not present

## 2019-04-06 DIAGNOSIS — I130 Hypertensive heart and chronic kidney disease with heart failure and stage 1 through stage 4 chronic kidney disease, or unspecified chronic kidney disease: Secondary | ICD-10-CM | POA: Diagnosis not present

## 2019-04-06 DIAGNOSIS — L03311 Cellulitis of abdominal wall: Secondary | ICD-10-CM | POA: Diagnosis not present

## 2019-04-06 MED ORDER — OXYCODONE HCL 5 MG PO TABS: 5.0000 mg | ORAL_TABLET | Freq: Four times a day (QID) | ORAL | 0 refills | Status: DC | PRN

## 2019-04-06 NOTE — Telephone Encounter (Signed)
Patient restarted wound vacuum. Needs refill of pain medication for wound vacuum changes. RN reported she was in pain with the change.   Will refill.   Return to clinic 9/3.   Curlene Labrum, MD [facility] [address] [place], [place] [address] [phone] (office)

## 2019-04-07 DIAGNOSIS — L88 Pyoderma gangrenosum: Secondary | ICD-10-CM | POA: Diagnosis not present

## 2019-04-07 DIAGNOSIS — T8141XA Infection following a procedure, superficial incisional surgical site, initial encounter: Secondary | ICD-10-CM | POA: Diagnosis not present

## 2019-04-07 DIAGNOSIS — L03311 Cellulitis of abdominal wall: Secondary | ICD-10-CM | POA: Diagnosis not present

## 2019-04-07 DIAGNOSIS — E1165 Type 2 diabetes mellitus with hyperglycemia: Secondary | ICD-10-CM | POA: Diagnosis not present

## 2019-04-07 LAB — FUNGUS CULTURE WITH STAIN

## 2019-04-07 LAB — FUNGAL ORGANISM REFLEX

## 2019-04-07 LAB — FUNGUS CULTURE RESULT

## 2019-04-08 ENCOUNTER — Other Ambulatory Visit: Payer: Self-pay

## 2019-04-08 DIAGNOSIS — L03311 Cellulitis of abdominal wall: Secondary | ICD-10-CM | POA: Diagnosis not present

## 2019-04-08 DIAGNOSIS — E1165 Type 2 diabetes mellitus with hyperglycemia: Secondary | ICD-10-CM | POA: Diagnosis not present

## 2019-04-08 DIAGNOSIS — F329 Major depressive disorder, single episode, unspecified: Secondary | ICD-10-CM | POA: Diagnosis not present

## 2019-04-08 DIAGNOSIS — Z6841 Body Mass Index (BMI) 40.0 and over, adult: Secondary | ICD-10-CM | POA: Diagnosis not present

## 2019-04-08 DIAGNOSIS — E1122 Type 2 diabetes mellitus with diabetic chronic kidney disease: Secondary | ICD-10-CM | POA: Diagnosis not present

## 2019-04-08 DIAGNOSIS — N183 Chronic kidney disease, stage 3 (moderate): Secondary | ICD-10-CM | POA: Diagnosis not present

## 2019-04-08 DIAGNOSIS — I130 Hypertensive heart and chronic kidney disease with heart failure and stage 1 through stage 4 chronic kidney disease, or unspecified chronic kidney disease: Secondary | ICD-10-CM | POA: Diagnosis not present

## 2019-04-08 DIAGNOSIS — Z87440 Personal history of urinary (tract) infections: Secondary | ICD-10-CM | POA: Diagnosis not present

## 2019-04-08 DIAGNOSIS — I5033 Acute on chronic diastolic (congestive) heart failure: Secondary | ICD-10-CM | POA: Diagnosis not present

## 2019-04-08 DIAGNOSIS — S31109D Unspecified open wound of abdominal wall, unspecified quadrant without penetration into peritoneal cavity, subsequent encounter: Secondary | ICD-10-CM | POA: Diagnosis not present

## 2019-04-08 DIAGNOSIS — I482 Chronic atrial fibrillation, unspecified: Secondary | ICD-10-CM | POA: Diagnosis not present

## 2019-04-08 DIAGNOSIS — J449 Chronic obstructive pulmonary disease, unspecified: Secondary | ICD-10-CM | POA: Diagnosis not present

## 2019-04-08 NOTE — Patient Outreach (Signed)
[facility] [employer]) Care Management  04/08/2019  [name] [birth year] [id number]  Successful outreach with [name]. She continues to receive home health services and abdominal wound VAC changes three times a week.  She reports attending all provider follow-ups as scheduled.   She will follow-up with her dental provider next week. Reports her gums are still swollen and painful since the tooth extraction on 03/17/19. She denies gingival bleeding since being evaluated in the ED on 03/18/19. Reports compliance with soft diet and warm saltwater rinses.   Reports compliance with medications and treatment recommendations. Her blood glucose levels continue to fluctuate due to prescribed prednisone. Reports a range of 68m/dl to 3780mdl within the past few days. Reports discussing parameters and indications for increasing Levemir with Dr. VaFara Oldenn yesterday. She continues to monitor her weight and BP daily. Morning readings: Weight- 201lbs      BP-120/80  [name] multiple chronic illnesses and is at high risk for complications. Thoroughly discussed worsening s/sx along with indications for seeking immediate medical attention. She verbalized understanding of these instructions. She denied urgent concerns and agreed to call if needed prior to the next outreach. Current Outpatient Medications on File Prior to Visit  Medication Sig Dispense Refill  . acetaminophen (TYLENOL) 500 MG tablet Take 500-1,000 mg by mouth every 8 (eight) hours as needed for mild pain or headache.     . albuterol (PROVENTIL HFA;VENTOLIN HFA) 108 (90 Base) MCG/ACT inhaler Inhale 2 puffs into the lungs every 6 (six) hours as needed for wheezing or shortness of breath. (Patient taking differently: Inhale 2 puffs into the lungs every 6 (six) hours as needed for wheezing or shortness of breath (As needed). ) 1 Inhaler 2  . apixaban (ELIQUIS) 5 MG TABS tablet Take 1 tablet (5 mg total) by mouth 2 (two) times daily.  60 tablet 3  . buPROPion (WELLBUTRIN XL) 300 MG 24 hr tablet Take 300 mg by mouth daily.     . collagenase (SANTYL) ointment Apply 1 application topically 2 (two) times daily. Twice daily applications to the abdominal wound with damp to dry saline dressings 15 g 1  . diltiazem (CARDIZEM CD) 120 MG 24 hr capsule Take 1 capsule (120 mg total) by mouth daily. (Patient taking differently: Take 120 mg by mouth daily. Cartia XT)    . escitalopram (LEXAPRO) 10 MG tablet Take 10 mg by mouth daily.    . ferrous sulfate (FERROUSUL) 325 (65 FE) MG tablet Take 1 tablet (325 mg total) by mouth 2 (two) times daily with a meal. 60 tablet 0  . fluticasone (FLONASE) 50 MCG/ACT nasal spray Place 2 sprays into both nostrils daily as needed for allergies or rhinitis (congestion).     . furosemide (LASIX) 20 MG tablet Take 2 tablets (40 mg total) by mouth 2 (two) times daily. 120 tablet 0  . glipiZIDE (GLUCOTROL) 10 MG tablet Take 10 mg by mouth 2 (two) times daily.  2  . levalbuterol (XOPENEX) 0.63 MG/3ML nebulizer solution Take 0.63 mg by nebulization every 6 (six) hours as needed for wheezing or shortness of breath.     . Marland KitchenEVEMIR FLEXTOUCH 100 UNIT/ML Pen Inject 10 Units into the skin 2 (two) times a day.    . metoprolol tartrate (LOPRESSOR) 50 MG tablet Take 50 mg by mouth 2 (two) times daily.     . Marland Kitchenystatin (MYCOSTATIN/NYSTOP) powder Apply topically daily as needed (rash in skin folds).    . Marland KitchenVER THE COUNTER MEDICATION Apply  1 application topically daily as needed (rash in skin folds). Baby butt paste    . oxyCODONE (OXY IR/ROXICODONE) 5 MG immediate release tablet Take 1 tablet (5 mg total) by mouth every 6 (six) hours as needed for severe pain (wound vacuum changes). 25 tablet 0  . pantoprazole (PROTONIX) 40 MG tablet Take 1 tablet (40 mg total) by mouth daily before breakfast. (Patient taking differently: Take 40 mg by mouth daily as needed (for GERD/acid reflux). )    . predniSONE (DELTASONE) 20 MG tablet Take 3  tablets (60 mg total) by mouth daily with breakfast. 90 tablet 0  . saccharomyces boulardii (FLORASTOR) 250 MG capsule Take 1 capsule (250 mg total) by mouth 2 (two) times daily. 60 capsule 0   No current facility-administered medications on file prior to visit.     PLAN -Will follow-up within two weeks.  [facility] Management [phone]

## 2019-04-11 DIAGNOSIS — I482 Chronic atrial fibrillation, unspecified: Secondary | ICD-10-CM | POA: Diagnosis not present

## 2019-04-11 DIAGNOSIS — L03311 Cellulitis of abdominal wall: Secondary | ICD-10-CM | POA: Diagnosis not present

## 2019-04-11 DIAGNOSIS — F329 Major depressive disorder, single episode, unspecified: Secondary | ICD-10-CM | POA: Diagnosis not present

## 2019-04-11 DIAGNOSIS — I130 Hypertensive heart and chronic kidney disease with heart failure and stage 1 through stage 4 chronic kidney disease, or unspecified chronic kidney disease: Secondary | ICD-10-CM | POA: Diagnosis not present

## 2019-04-11 DIAGNOSIS — E1165 Type 2 diabetes mellitus with hyperglycemia: Secondary | ICD-10-CM | POA: Diagnosis not present

## 2019-04-11 DIAGNOSIS — J449 Chronic obstructive pulmonary disease, unspecified: Secondary | ICD-10-CM | POA: Diagnosis not present

## 2019-04-11 DIAGNOSIS — I5033 Acute on chronic diastolic (congestive) heart failure: Secondary | ICD-10-CM | POA: Diagnosis not present

## 2019-04-11 DIAGNOSIS — N183 Chronic kidney disease, stage 3 (moderate): Secondary | ICD-10-CM | POA: Diagnosis not present

## 2019-04-11 DIAGNOSIS — S31109A Unspecified open wound of abdominal wall, unspecified quadrant without penetration into peritoneal cavity, initial encounter: Secondary | ICD-10-CM | POA: Diagnosis not present

## 2019-04-11 DIAGNOSIS — D509 Iron deficiency anemia, unspecified: Secondary | ICD-10-CM | POA: Diagnosis not present

## 2019-04-11 DIAGNOSIS — Z4801 Encounter for change or removal of surgical wound dressing: Secondary | ICD-10-CM | POA: Diagnosis not present

## 2019-04-11 DIAGNOSIS — L88 Pyoderma gangrenosum: Secondary | ICD-10-CM | POA: Diagnosis not present

## 2019-04-11 DIAGNOSIS — E1122 Type 2 diabetes mellitus with diabetic chronic kidney disease: Secondary | ICD-10-CM | POA: Diagnosis not present

## 2019-04-11 DIAGNOSIS — T8141XA Infection following a procedure, superficial incisional surgical site, initial encounter: Secondary | ICD-10-CM | POA: Diagnosis not present

## 2019-04-13 DIAGNOSIS — J449 Chronic obstructive pulmonary disease, unspecified: Secondary | ICD-10-CM | POA: Diagnosis not present

## 2019-04-13 DIAGNOSIS — F329 Major depressive disorder, single episode, unspecified: Secondary | ICD-10-CM | POA: Diagnosis not present

## 2019-04-13 DIAGNOSIS — E1165 Type 2 diabetes mellitus with hyperglycemia: Secondary | ICD-10-CM | POA: Diagnosis not present

## 2019-04-13 DIAGNOSIS — D509 Iron deficiency anemia, unspecified: Secondary | ICD-10-CM | POA: Diagnosis not present

## 2019-04-13 DIAGNOSIS — E1122 Type 2 diabetes mellitus with diabetic chronic kidney disease: Secondary | ICD-10-CM | POA: Diagnosis not present

## 2019-04-13 DIAGNOSIS — N183 Chronic kidney disease, stage 3 (moderate): Secondary | ICD-10-CM | POA: Diagnosis not present

## 2019-04-13 DIAGNOSIS — T8141XA Infection following a procedure, superficial incisional surgical site, initial encounter: Secondary | ICD-10-CM | POA: Diagnosis not present

## 2019-04-13 DIAGNOSIS — I482 Chronic atrial fibrillation, unspecified: Secondary | ICD-10-CM | POA: Diagnosis not present

## 2019-04-13 DIAGNOSIS — Z4801 Encounter for change or removal of surgical wound dressing: Secondary | ICD-10-CM | POA: Diagnosis not present

## 2019-04-13 DIAGNOSIS — I5033 Acute on chronic diastolic (congestive) heart failure: Secondary | ICD-10-CM | POA: Diagnosis not present

## 2019-04-13 DIAGNOSIS — L88 Pyoderma gangrenosum: Secondary | ICD-10-CM | POA: Diagnosis not present

## 2019-04-13 DIAGNOSIS — L03311 Cellulitis of abdominal wall: Secondary | ICD-10-CM | POA: Diagnosis not present

## 2019-04-13 DIAGNOSIS — I130 Hypertensive heart and chronic kidney disease with heart failure and stage 1 through stage 4 chronic kidney disease, or unspecified chronic kidney disease: Secondary | ICD-10-CM | POA: Diagnosis not present

## 2019-04-15 DIAGNOSIS — D509 Iron deficiency anemia, unspecified: Secondary | ICD-10-CM | POA: Diagnosis not present

## 2019-04-15 DIAGNOSIS — E1122 Type 2 diabetes mellitus with diabetic chronic kidney disease: Secondary | ICD-10-CM | POA: Diagnosis not present

## 2019-04-15 DIAGNOSIS — T8141XA Infection following a procedure, superficial incisional surgical site, initial encounter: Secondary | ICD-10-CM | POA: Diagnosis not present

## 2019-04-15 DIAGNOSIS — I130 Hypertensive heart and chronic kidney disease with heart failure and stage 1 through stage 4 chronic kidney disease, or unspecified chronic kidney disease: Secondary | ICD-10-CM | POA: Diagnosis not present

## 2019-04-15 DIAGNOSIS — Z4801 Encounter for change or removal of surgical wound dressing: Secondary | ICD-10-CM | POA: Diagnosis not present

## 2019-04-15 DIAGNOSIS — J449 Chronic obstructive pulmonary disease, unspecified: Secondary | ICD-10-CM | POA: Diagnosis not present

## 2019-04-15 DIAGNOSIS — N183 Chronic kidney disease, stage 3 (moderate): Secondary | ICD-10-CM | POA: Diagnosis not present

## 2019-04-15 DIAGNOSIS — L03311 Cellulitis of abdominal wall: Secondary | ICD-10-CM | POA: Diagnosis not present

## 2019-04-15 DIAGNOSIS — L88 Pyoderma gangrenosum: Secondary | ICD-10-CM | POA: Diagnosis not present

## 2019-04-15 DIAGNOSIS — I5033 Acute on chronic diastolic (congestive) heart failure: Secondary | ICD-10-CM | POA: Diagnosis not present

## 2019-04-15 DIAGNOSIS — I482 Chronic atrial fibrillation, unspecified: Secondary | ICD-10-CM | POA: Diagnosis not present

## 2019-04-15 DIAGNOSIS — E1165 Type 2 diabetes mellitus with hyperglycemia: Secondary | ICD-10-CM | POA: Diagnosis not present

## 2019-04-15 DIAGNOSIS — F329 Major depressive disorder, single episode, unspecified: Secondary | ICD-10-CM | POA: Diagnosis not present

## 2019-04-18 DIAGNOSIS — E1165 Type 2 diabetes mellitus with hyperglycemia: Secondary | ICD-10-CM | POA: Diagnosis not present

## 2019-04-18 DIAGNOSIS — I5033 Acute on chronic diastolic (congestive) heart failure: Secondary | ICD-10-CM | POA: Diagnosis not present

## 2019-04-18 DIAGNOSIS — F329 Major depressive disorder, single episode, unspecified: Secondary | ICD-10-CM | POA: Diagnosis not present

## 2019-04-18 DIAGNOSIS — E1122 Type 2 diabetes mellitus with diabetic chronic kidney disease: Secondary | ICD-10-CM | POA: Diagnosis not present

## 2019-04-18 DIAGNOSIS — Z4801 Encounter for change or removal of surgical wound dressing: Secondary | ICD-10-CM | POA: Diagnosis not present

## 2019-04-18 DIAGNOSIS — J449 Chronic obstructive pulmonary disease, unspecified: Secondary | ICD-10-CM | POA: Diagnosis not present

## 2019-04-18 DIAGNOSIS — I130 Hypertensive heart and chronic kidney disease with heart failure and stage 1 through stage 4 chronic kidney disease, or unspecified chronic kidney disease: Secondary | ICD-10-CM | POA: Diagnosis not present

## 2019-04-18 DIAGNOSIS — L03311 Cellulitis of abdominal wall: Secondary | ICD-10-CM | POA: Diagnosis not present

## 2019-04-18 DIAGNOSIS — N183 Chronic kidney disease, stage 3 (moderate): Secondary | ICD-10-CM | POA: Diagnosis not present

## 2019-04-18 DIAGNOSIS — L88 Pyoderma gangrenosum: Secondary | ICD-10-CM | POA: Diagnosis not present

## 2019-04-18 DIAGNOSIS — T8141XA Infection following a procedure, superficial incisional surgical site, initial encounter: Secondary | ICD-10-CM | POA: Diagnosis not present

## 2019-04-18 DIAGNOSIS — D509 Iron deficiency anemia, unspecified: Secondary | ICD-10-CM | POA: Diagnosis not present

## 2019-04-18 DIAGNOSIS — I482 Chronic atrial fibrillation, unspecified: Secondary | ICD-10-CM | POA: Diagnosis not present

## 2019-04-20 DIAGNOSIS — F329 Major depressive disorder, single episode, unspecified: Secondary | ICD-10-CM | POA: Diagnosis not present

## 2019-04-20 DIAGNOSIS — I5033 Acute on chronic diastolic (congestive) heart failure: Secondary | ICD-10-CM | POA: Diagnosis not present

## 2019-04-20 DIAGNOSIS — I482 Chronic atrial fibrillation, unspecified: Secondary | ICD-10-CM | POA: Diagnosis not present

## 2019-04-20 DIAGNOSIS — L03311 Cellulitis of abdominal wall: Secondary | ICD-10-CM | POA: Diagnosis not present

## 2019-04-20 DIAGNOSIS — E1165 Type 2 diabetes mellitus with hyperglycemia: Secondary | ICD-10-CM | POA: Diagnosis not present

## 2019-04-20 DIAGNOSIS — L88 Pyoderma gangrenosum: Secondary | ICD-10-CM | POA: Diagnosis not present

## 2019-04-20 DIAGNOSIS — Z4801 Encounter for change or removal of surgical wound dressing: Secondary | ICD-10-CM | POA: Diagnosis not present

## 2019-04-20 DIAGNOSIS — T8141XA Infection following a procedure, superficial incisional surgical site, initial encounter: Secondary | ICD-10-CM | POA: Diagnosis not present

## 2019-04-20 DIAGNOSIS — N183 Chronic kidney disease, stage 3 (moderate): Secondary | ICD-10-CM | POA: Diagnosis not present

## 2019-04-20 DIAGNOSIS — D509 Iron deficiency anemia, unspecified: Secondary | ICD-10-CM | POA: Diagnosis not present

## 2019-04-20 DIAGNOSIS — E1122 Type 2 diabetes mellitus with diabetic chronic kidney disease: Secondary | ICD-10-CM | POA: Diagnosis not present

## 2019-04-20 DIAGNOSIS — I130 Hypertensive heart and chronic kidney disease with heart failure and stage 1 through stage 4 chronic kidney disease, or unspecified chronic kidney disease: Secondary | ICD-10-CM | POA: Diagnosis not present

## 2019-04-20 DIAGNOSIS — J449 Chronic obstructive pulmonary disease, unspecified: Secondary | ICD-10-CM | POA: Diagnosis not present

## 2019-04-21 ENCOUNTER — Other Ambulatory Visit: Payer: Self-pay

## 2019-04-21 ENCOUNTER — Encounter: Payer: Self-pay | Admitting: General Surgery

## 2019-04-21 ENCOUNTER — Ambulatory Visit (INDEPENDENT_AMBULATORY_CARE_PROVIDER_SITE_OTHER): Payer: Self-pay | Admitting: General Surgery

## 2019-04-21 VITALS — BP 143/78 | HR 67 | Temp 95.9°F | Resp 18 | Ht 64.0 in | Wt 197.0 lb

## 2019-04-21 DIAGNOSIS — S31109D Unspecified open wound of abdominal wall, unspecified quadrant without penetration into peritoneal cavity, subsequent encounter: Secondary | ICD-10-CM

## 2019-04-21 DIAGNOSIS — L98492 Non-pressure chronic ulcer of skin of other sites with fat layer exposed: Secondary | ICD-10-CM

## 2019-04-21 DIAGNOSIS — L089 Local infection of the skin and subcutaneous tissue, unspecified: Secondary | ICD-10-CM

## 2019-04-21 DIAGNOSIS — L88 Pyoderma gangrenosum: Secondary | ICD-10-CM

## 2019-04-21 NOTE — Progress Notes (Signed)
[facility] Note   HPI:  68 y.o. Female presents to clinic for follow-up evaluation of her abdominal wound. Did a week without the vacuum and had some santyl to the fibrinous areas and wet to dry dressing. Now back with the vacuum and doing fair. Says she has not felt great this week and feels tired. Her BS have been going high and low with the prednisone. She has stopped the prednisone herself. She has not seen dermatology but has an appt in a few weeks she says.   Review of Systems:  No fevers or chills No shortness of breath + diarrhea +malaise +Hypogylcemia  All other review of systems: otherwise negative   Vital Signs:  BP (!) 143/78 (BP Location: Left Arm, Patient Position: Sitting, Cuff Size: Normal)   Pulse 67   Temp (!) 95.9 F (35.5 C) (Tympanic)   Resp 18   Ht 5' 4"  (1.626 m)   Wt 197 lb (89.4 kg)   SpO2 (!) 89%   BMI 33.81 kg/m    Physical Exam:  Physical Exam Vitals signs reviewed.  HENT:     Head: Normocephalic.  Cardiovascular:     Rate and Rhythm: Normal rate.  Pulmonary:     Effort: Pulmonary effort is normal.  Abdominal:     Comments: Wound with granulation central and some minor fibrinous tissue, no drainage, skin without erythema   Neurological:     Mental Status: She is alert.      Assessment:  68 y.o. 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 She was on prednisone but has stopped this herself due to her blood sugars. She has not seen dermatology yet as I would have hoped they could have helped with the prednisone tapering given the risk of pyoderma gangrenosum.  Fortunately the wound is healing and is not showing signs that we will have issues, but with the cessation of the prednisone there is a possibility of worsening wounds or redevelopment. Patient not feeling great and saturations 89%. She is not SOB and is not coughing or having other symptoms.  She says she just feels tired.   Plan:  -  Continue wound vacuum changes three times a week  - Patient to go to ED with worsening symptoms or development of SOB/ cough; she agrees and expresses understanding  Future Appointments  Date Time Provider [place]  04/22/2019  2:00 PM Neldon Labella, RN [facility] None  05/12/2019  3:30 PM Virl Cagey, MD [facility] None  08/04/2019 11:00 AM Herminio Commons, MD [facility]     All of the above recommendations were discussed with the patient, and all of patient's questions were answered to her expressed satisfaction.  Curlene Labrum, MD [facility] [address] [place], [place] [address] [phone] (office)

## 2019-04-21 NOTE — Patient Instructions (Signed)
Continue wound vacuum changes. Go to the ED with worsening blood sugars, SOB, or feeling poorly.

## 2019-04-22 ENCOUNTER — Inpatient Hospital Stay (HOSPITAL_COMMUNITY)
Admission: EM | Admit: 2019-04-22 | Discharge: 2019-04-25 | DRG: 308 | Disposition: A | Discharge status: Home Health | Payer: BC Managed Care – PPO | Attending: Internal Medicine | Admitting: Internal Medicine

## 2019-04-22 ENCOUNTER — Other Ambulatory Visit: Payer: Self-pay

## 2019-04-22 ENCOUNTER — Encounter (HOSPITAL_COMMUNITY): Payer: Self-pay

## 2019-04-22 ENCOUNTER — Emergency Department (HOSPITAL_COMMUNITY): Payer: BC Managed Care – PPO

## 2019-04-22 DIAGNOSIS — T148XXA Other injury of unspecified body region, initial encounter: Secondary | ICD-10-CM | POA: Diagnosis not present

## 2019-04-22 DIAGNOSIS — F329 Major depressive disorder, single episode, unspecified: Secondary | ICD-10-CM | POA: Diagnosis present

## 2019-04-22 DIAGNOSIS — Z833 Family history of diabetes mellitus: Secondary | ICD-10-CM

## 2019-04-22 DIAGNOSIS — T8141XA Infection following a procedure, superficial incisional surgical site, initial encounter: Secondary | ICD-10-CM | POA: Diagnosis not present

## 2019-04-22 DIAGNOSIS — Z91040 Latex allergy status: Secondary | ICD-10-CM

## 2019-04-22 DIAGNOSIS — I5033 Acute on chronic diastolic (congestive) heart failure: Secondary | ICD-10-CM | POA: Diagnosis not present

## 2019-04-22 DIAGNOSIS — Z452 Encounter for adjustment and management of vascular access device: Secondary | ICD-10-CM | POA: Diagnosis not present

## 2019-04-22 DIAGNOSIS — I4891 Unspecified atrial fibrillation: Secondary | ICD-10-CM | POA: Diagnosis not present

## 2019-04-22 DIAGNOSIS — Z7901 Long term (current) use of anticoagulants: Secondary | ICD-10-CM | POA: Diagnosis not present

## 2019-04-22 DIAGNOSIS — J449 Chronic obstructive pulmonary disease, unspecified: Secondary | ICD-10-CM | POA: Diagnosis present

## 2019-04-22 DIAGNOSIS — I5032 Chronic diastolic (congestive) heart failure: Secondary | ICD-10-CM

## 2019-04-22 DIAGNOSIS — E1165 Type 2 diabetes mellitus with hyperglycemia: Secondary | ICD-10-CM | POA: Diagnosis present

## 2019-04-22 DIAGNOSIS — R0602 Shortness of breath: Secondary | ICD-10-CM | POA: Diagnosis not present

## 2019-04-22 DIAGNOSIS — I480 Paroxysmal atrial fibrillation: Secondary | ICD-10-CM | POA: Diagnosis not present

## 2019-04-22 DIAGNOSIS — Z888 Allergy status to other drugs, medicaments and biological substances status: Secondary | ICD-10-CM | POA: Diagnosis not present

## 2019-04-22 DIAGNOSIS — I10 Essential (primary) hypertension: Secondary | ICD-10-CM | POA: Diagnosis present

## 2019-04-22 DIAGNOSIS — IMO0002 Reserved for concepts with insufficient information to code with codable children: Secondary | ICD-10-CM

## 2019-04-22 DIAGNOSIS — Z7952 Long term (current) use of systemic steroids: Secondary | ICD-10-CM

## 2019-04-22 DIAGNOSIS — S31109S Unspecified open wound of abdominal wall, unspecified quadrant without penetration into peritoneal cavity, sequela: Secondary | ICD-10-CM | POA: Diagnosis not present

## 2019-04-22 DIAGNOSIS — E871 Hypo-osmolality and hyponatremia: Secondary | ICD-10-CM | POA: Diagnosis not present

## 2019-04-22 DIAGNOSIS — Z803 Family history of malignant neoplasm of breast: Secondary | ICD-10-CM

## 2019-04-22 DIAGNOSIS — I130 Hypertensive heart and chronic kidney disease with heart failure and stage 1 through stage 4 chronic kidney disease, or unspecified chronic kidney disease: Secondary | ICD-10-CM | POA: Diagnosis not present

## 2019-04-22 DIAGNOSIS — E872 Acidosis: Secondary | ICD-10-CM | POA: Diagnosis not present

## 2019-04-22 DIAGNOSIS — Z993 Dependence on wheelchair: Secondary | ICD-10-CM

## 2019-04-22 DIAGNOSIS — L88 Pyoderma gangrenosum: Secondary | ICD-10-CM | POA: Diagnosis not present

## 2019-04-22 DIAGNOSIS — E1122 Type 2 diabetes mellitus with diabetic chronic kidney disease: Secondary | ICD-10-CM | POA: Diagnosis present

## 2019-04-22 DIAGNOSIS — R42 Dizziness and giddiness: Secondary | ICD-10-CM | POA: Diagnosis not present

## 2019-04-22 DIAGNOSIS — Z794 Long term (current) use of insulin: Secondary | ICD-10-CM | POA: Diagnosis not present

## 2019-04-22 DIAGNOSIS — A419 Sepsis, unspecified organism: Secondary | ICD-10-CM | POA: Diagnosis not present

## 2019-04-22 DIAGNOSIS — N183 Chronic kidney disease, stage 3 unspecified: Secondary | ICD-10-CM | POA: Diagnosis present

## 2019-04-22 DIAGNOSIS — L089 Local infection of the skin and subcutaneous tissue, unspecified: Secondary | ICD-10-CM | POA: Diagnosis not present

## 2019-04-22 DIAGNOSIS — Z823 Family history of stroke: Secondary | ICD-10-CM

## 2019-04-22 DIAGNOSIS — R9431 Abnormal electrocardiogram [ECG] [EKG]: Secondary | ICD-10-CM | POA: Diagnosis not present

## 2019-04-22 DIAGNOSIS — R739 Hyperglycemia, unspecified: Secondary | ICD-10-CM | POA: Diagnosis present

## 2019-04-22 DIAGNOSIS — Z20828 Contact with and (suspected) exposure to other viral communicable diseases: Secondary | ICD-10-CM | POA: Diagnosis present

## 2019-04-22 DIAGNOSIS — Z03818 Encounter for observation for suspected exposure to other biological agents ruled out: Secondary | ICD-10-CM | POA: Diagnosis not present

## 2019-04-22 DIAGNOSIS — D509 Iron deficiency anemia, unspecified: Secondary | ICD-10-CM | POA: Diagnosis not present

## 2019-04-22 DIAGNOSIS — Z885 Allergy status to narcotic agent status: Secondary | ICD-10-CM

## 2019-04-22 DIAGNOSIS — I482 Chronic atrial fibrillation, unspecified: Secondary | ICD-10-CM | POA: Diagnosis not present

## 2019-04-22 DIAGNOSIS — F32A Depression, unspecified: Secondary | ICD-10-CM | POA: Diagnosis present

## 2019-04-22 DIAGNOSIS — T380X5A Adverse effect of glucocorticoids and synthetic analogues, initial encounter: Secondary | ICD-10-CM | POA: Diagnosis present

## 2019-04-22 DIAGNOSIS — Z4801 Encounter for change or removal of surgical wound dressing: Secondary | ICD-10-CM | POA: Diagnosis not present

## 2019-04-22 DIAGNOSIS — R002 Palpitations: Secondary | ICD-10-CM | POA: Diagnosis not present

## 2019-04-22 DIAGNOSIS — M810 Age-related osteoporosis without current pathological fracture: Secondary | ICD-10-CM | POA: Diagnosis not present

## 2019-04-22 DIAGNOSIS — R531 Weakness: Secondary | ICD-10-CM | POA: Diagnosis not present

## 2019-04-22 DIAGNOSIS — K5730 Diverticulosis of large intestine without perforation or abscess without bleeding: Secondary | ICD-10-CM | POA: Diagnosis not present

## 2019-04-22 DIAGNOSIS — E861 Hypovolemia: Secondary | ICD-10-CM | POA: Diagnosis not present

## 2019-04-22 DIAGNOSIS — Z6836 Body mass index (BMI) 36.0-36.9, adult: Secondary | ICD-10-CM

## 2019-04-22 DIAGNOSIS — R918 Other nonspecific abnormal finding of lung field: Secondary | ICD-10-CM | POA: Diagnosis not present

## 2019-04-22 DIAGNOSIS — Z7951 Long term (current) use of inhaled steroids: Secondary | ICD-10-CM

## 2019-04-22 DIAGNOSIS — L03311 Cellulitis of abdominal wall: Secondary | ICD-10-CM | POA: Diagnosis not present

## 2019-04-22 DIAGNOSIS — K219 Gastro-esophageal reflux disease without esophagitis: Secondary | ICD-10-CM | POA: Diagnosis present

## 2019-04-22 LAB — CBC
HCT: 31.9 % — ABNORMAL LOW (ref 36.0–46.0)
Hemoglobin: 9.5 g/dL — ABNORMAL LOW (ref 12.0–15.0)
MCH: 26.2 pg (ref 26.0–34.0)
MCHC: 29.8 g/dL — ABNORMAL LOW (ref 30.0–36.0)
MCV: 87.9 fL (ref 80.0–100.0)
Platelets: 635 10*3/uL — ABNORMAL HIGH (ref 150–400)
RBC: 3.63 MIL/uL — ABNORMAL LOW (ref 3.87–5.11)
RDW: 15.1 % (ref 11.5–15.5)
WBC: 13.5 10*3/uL — ABNORMAL HIGH (ref 4.0–10.5)
nRBC: 0.8 % — ABNORMAL HIGH (ref 0.0–0.2)

## 2019-04-22 LAB — LACTIC ACID, PLASMA
Lactic Acid, Venous: 2.3 mmol/L (ref 0.5–1.9)
Lactic Acid, Venous: 1.5 mmol/L (ref 0.5–1.9)

## 2019-04-22 LAB — HEPATIC FUNCTION PANEL
ALT: 10 U/L (ref 0–44)
AST: 10 U/L — ABNORMAL LOW (ref 15–41)
Albumin: 2.4 g/dL — ABNORMAL LOW (ref 3.5–5.0)
Alkaline Phosphatase: 131 U/L — ABNORMAL HIGH (ref 38–126)
Bilirubin, Direct: 0.2 mg/dL (ref 0.0–0.2)
Indirect Bilirubin: 0.4 mg/dL (ref 0.3–0.9)
Total Bilirubin: 0.6 mg/dL (ref 0.3–1.2)
Total Protein: 5.9 g/dL — ABNORMAL LOW (ref 6.5–8.1)

## 2019-04-22 LAB — BASIC METABOLIC PANEL
Anion gap: 13 (ref 5–15)
BUN: 22 mg/dL (ref 8–23)
CO2: 19 mmol/L — ABNORMAL LOW (ref 22–32)
Calcium: 7.9 mg/dL — ABNORMAL LOW (ref 8.9–10.3)
Chloride: 101 mmol/L (ref 98–111)
Creatinine, Ser: 1.08 mg/dL — ABNORMAL HIGH (ref 0.44–1.00)
GFR calc Af Amer: >60 mL/min (ref >60)
GFR calc non Af Amer: 53 mL/min — ABNORMAL LOW (ref >60)
Glucose, Bld: 348 mg/dL — ABNORMAL HIGH (ref 70–99)
Potassium: 4.5 mmol/L (ref 3.5–5.1)
Sodium: 133 mmol/L — ABNORMAL LOW (ref 135–145)

## 2019-04-22 LAB — CBG MONITORING, ED: Glucose-Capillary: 318 mg/dL — ABNORMAL HIGH (ref 70–99)

## 2019-04-22 MED ORDER — DILTIAZEM HCL 25 MG/5ML IV SOLN
10.0000 mg | Freq: Once | INTRAVENOUS | Status: AC
  Administered 2019-04-22: 10 mg via INTRAVENOUS
  Filled 2019-04-22: qty 5

## 2019-04-22 MED ORDER — DILTIAZEM HCL 100 MG IV SOLR
5.0000 mg/h | INTRAVENOUS | Status: DC
  Administered 2019-04-22: 5 mg/h via INTRAVENOUS
  Administered 2019-04-23: 15 mg/h via INTRAVENOUS
  Filled 2019-04-22 (×2): qty 100

## 2019-04-22 MED ORDER — SODIUM CHLORIDE 0.9% FLUSH
3.0000 mL | Freq: Two times a day (BID) | INTRAVENOUS | Status: DC
  Administered 2019-04-23 – 2019-04-25 (×5): 3 mL via INTRAVENOUS

## 2019-04-22 MED ORDER — SODIUM CHLORIDE 0.9 % IV BOLUS
1000.0000 mL | Freq: Once | INTRAVENOUS | Status: AC
  Administered 2019-04-22: 1000 mL via INTRAVENOUS

## 2019-04-22 MED ORDER — ONDANSETRON HCL 4 MG/2ML IJ SOLN
4.0000 mg | Freq: Once | INTRAMUSCULAR | Status: AC
  Administered 2019-04-22: 4 mg via INTRAVENOUS
  Filled 2019-04-22: qty 2

## 2019-04-22 MED ORDER — SODIUM CHLORIDE 0.9 % IV SOLN: 250.0000 mL | INTRAVENOUS | Status: DC | PRN

## 2019-04-22 MED ORDER — ACETAMINOPHEN 325 MG PO TABS: 650.0000 mg | ORAL_TABLET | Freq: Four times a day (QID) | ORAL | Status: DC | PRN

## 2019-04-22 MED ORDER — INSULIN ASPART 100 UNIT/ML ~~LOC~~ SOLN
0.0000 [IU] | Freq: Three times a day (TID) | SUBCUTANEOUS | Status: DC
  Administered 2019-04-23: 3 [IU] via SUBCUTANEOUS
  Administered 2019-04-24: 5 [IU] via SUBCUTANEOUS
  Administered 2019-04-25: 3 [IU] via SUBCUTANEOUS
  Administered 2019-04-25: 2 [IU] via SUBCUTANEOUS

## 2019-04-22 MED ORDER — IOHEXOL 350 MG/ML SOLN
100.0000 mL | Freq: Once | INTRAVENOUS | Status: AC | PRN
  Administered 2019-04-22: 100 mL via INTRAVENOUS

## 2019-04-22 MED ORDER — ACETAMINOPHEN 650 MG RE SUPP: 650.0000 mg | Freq: Four times a day (QID) | RECTAL | Status: DC | PRN

## 2019-04-22 MED ORDER — SODIUM CHLORIDE 0.9% FLUSH: 3.0000 mL | INTRAVENOUS | Status: DC | PRN

## 2019-04-22 MED ORDER — APIXABAN 5 MG PO TABS
5.0000 mg | ORAL_TABLET | Freq: Two times a day (BID) | ORAL | Status: DC
  Administered 2019-04-23 – 2019-04-25 (×6): 5 mg via ORAL
  Filled 2019-04-22 (×6): qty 1

## 2019-04-22 MED ORDER — INSULIN ASPART 100 UNIT/ML ~~LOC~~ SOLN: 0.0000 [IU] | Freq: Every day | SUBCUTANEOUS | Status: DC

## 2019-04-22 MED ORDER — HYDROMORPHONE HCL 1 MG/ML IJ SOLN
0.5000 mg | Freq: Once | INTRAMUSCULAR | Status: AC
  Administered 2019-04-22: 23:00:00 0.5 mg via INTRAVENOUS
  Filled 2019-04-22: qty 1

## 2019-04-22 NOTE — ED Provider Notes (Signed)
[facility] EMERGENCY DEPARTMENT Provider Note   CSN: [id number] Arrival date & time: 04/22/19  1740     History   Chief Complaint Chief Complaint  Patient presents with  . Hyperglycemia    HPI [name] is a 68 y.o. female with past medical history of pyoderma granulosum, A. fib, CHF, depression, diabetes, hypertension who presents for evaluation of generalized malaise, nausea, hyperglycemia that has been ongoing for last 3 days.  Patient reports that she has been on prednisone for a wound that she had secondary to a surgery that occurred on 03/09/19 where she had infected abdomen ulcer removed.  She states that she saw her surgeon yesterday who noted that the wound had been healing well and that the wound VAC looked good.  She had her dressing changed yesterday.  She comes in today because she states her blood sugars have been fluctuating.  She reports this morning when she woke up, was about 40 and then states that throughout the day, went up to 500.  She does report that she had been on prednisone and took herself off secondary to it making her blood sugars high.  She states she has had some nausea but denies any vomiting.  She reports some pain around the abdomen but states she has not noticed any overlying warmth, erythema.  She has not noted any fevers.  She denies any chest pain, difficulty breathing.  She states that she is on insulin for diabetes but did not take any insulin today.     The history is provided by the patient.    Past Medical History:  Diagnosis Date  . Atrial fibrillation ([place])   . CHF (congestive heart failure) ([place])   . Depression   . Diabetes mellitus without complication ([place])   . 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 Hypertension   . Morbid obesity ([place])   . Osteoporosis   . Urinary incontinence   . UTI (lower urinary tract infection) 01/2016   Cipro for Klebsiella pneumoniae isolate  . Wheelchair bound    bedbound    Patient Active Problem List   Diagnosis Date Noted  . Pyoderma gangrenosum 03/11/2019  . Wound infection 03/09/2019  . Chronic wound infection of abdomen   . Wheelchair bound   . Abdominal wall skin ulcer ([place]) 03/07/2019  . Acute pulmonary edema ([place]) 02/17/2019  . Iron deficiency anemia 02/01/2019  . Abdominal wall ulcer, with fat layer exposed ([place]) 01/16/2019  . Elevated troponin   . Nausea and vomiting   . Symptomatic bradycardia 01/10/2019  . DKA (diabetic ketoacidoses) ([place]) 01/10/2019  . Urinary incontinence 12/28/2018  . Recurrent urinary tract infection 12/16/2018  . Wound, open, anterior abdominal wall 12/05/2018  . Hypotension due to hypovolemia   . Gastritis due to nonsteroidal anti-inflammatory drug   . Esophageal dysphagia   . GI bleed 09/03/2018  . Coagulopathy ([place])   . Colitis   . Lower abdominal pain   . Rectal bleeding   . Dehydration 08/16/2018  . Acute on chronic diastolic CHF (congestive heart failure) ([place]) 08/10/2018  . Acute lower UTI 08/10/2018  . Hypokalemia 08/10/2018  . COPD (chronic obstructive pulmonary disease) ([place]) 08/10/2018  . Thyroid lesion 08/10/2018  . Closed fracture of right ankle 06/10/2018  . Closed fracture of left tibia and fibula with routine healing 04/13/18 06/10/2018  . Atrial fibrillation ([place]) 04/15/2018  . Chronic diastolic CHF (congestive heart failure) ([place]) 04/15/2018  . CKD (chronic kidney disease),  stage III ([place]) 04/15/2018  . Closed right fibular fracture 04/15/2018  . Left tibial fracture 04/14/2018  . CHF exacerbation ([place]) 04/10/2018  . SOB (shortness of breath)   . Acute CHF (congestive heart failure) ([place]) 04/01/2018  . Atrial fibrillation with RVR ([place]) 04/01/2018  . [place] 03/11/2016  . Muscle spasms of lower extremity 03/04/2016  . Acute renal insufficiency 02/26/2016  . History of MRSA infection 02/26/2016  . HTN (hypertension) 02/19/2016  . Fracture, femur, distal ([place]) 02/19/2016  .  Fall 02/19/2016  . Depression 02/19/2016  . Closed left femoral fracture, initial encounter 02/19/2016  . Femur fracture, left ([place]) 02/19/2016    Past Surgical History:  Procedure Laterality Date  . ABDOMINAL HYSTERECTOMY    . APPLICATION OF WOUND VAC  03/09/2019   Procedure: APPLICATION OF WOUND VAC;  Surgeon: Virl Cagey, MD;  Location: [facility] ORS;  Service: General;;  . BIOPSY  09/06/2018   Procedure: BIOPSY;  Surgeon: Danie Binder, MD;  Location: [facility] ENDO SUITE;  Service: Endoscopy;;  gastric bx's  . CESAREAN SECTION    . CHOLECYSTECTOMY    . ESOPHAGEAL DILATION  09/06/2018   Procedure: ESOPHAGEAL DILATION;  Surgeon: Danie Binder, MD;  Location: [facility] END[facility] SU[facility];  Service: Endoscopy;;  . ESOPHAGOGASTRODUODENOSCOPY (EGD) WITH PROPOFOL N/A 09/06/2018   Procedure: ESOPHAGOGASTRODUODENOSCOPY (EGD) WITH PROPOFOL;  Surgeon: Danie Binder, MD;  Location: [facility];  Service: Endoscopy;  Laterality: N/A;  dilatation  . FEMUR IM NAIL Left 02/20/2016  . FEMUR IM NAIL Left 02/20/2016   Procedure: INTRAMEDULLARY (IM) RETROGRADE FEMORAL NAILING;  Surgeon: Leandrew Koyanagi, MD;  Location: [facility];  Service: Orthopedics;  Laterality: Left;  . PANCREAS SURGERY  1967   1 cyst excised and one cyst drained  . TEE WITHOUT CARDIOVERSION N/A 04/05/2018   Procedure: TRANSESOPHAGEAL ECHOCARDIOGRAM (TEE);  Surgeon: Dorothy Spark, MD;  Location: [facility];  Service: Cardiovascular;  Laterality: N/A;  . [place]    . WOUND DEBRIDEMENT N/A 03/09/2019   Procedure: EXCISIONAL DEBRIDEMENT OF ABDOMINAL WOUND ULCERS;  Surgeon: Virl Cagey, MD;  Location: [facility];  Service: General;  Laterality: N/A;  . WRIST FRACTURE SURGERY       OB History   No obstetric history on file.      Home Medications    Prior to Admission medications   Medication Sig Start Date End Date Taking? Authorizing Provider  acetaminophen (TYLENOL) 500 MG tablet Take 500-1,000 mg by mouth every 8 (eight)  hours as needed for mild pain or headache.    Yes [provider]  albuterol (PROVENTIL HFA;VENTOLIN HFA) 108 (90 Base) MCG/ACT inhaler Inhale 2 puffs into the lungs every 6 (six) hours as needed for wheezing or shortness of breath. Patient taking differently: Inhale 2 puffs into the lungs every 6 (six) hours as needed for wheezing or shortness of breath (As needed).  08/14/18  Yes Kathie Dike, MD  apixaban (ELIQUIS) 5 MG TABS tablet Take 1 tablet (5 mg total) by mouth 2 (two) times daily. 07/21/18  Yes Herminio Commons, MD  buPROPion (WELLBUTRIN XL) 300 MG 24 hr tablet Take 300 mg by mouth daily.  01/31/19  Yes [provider]  diltiazem (CARDIZEM CD) 120 MG 24 hr capsule Take 1 capsule (120 mg total) by mouth daily. Patient taking differently: Take 120 mg by mouth daily. Cartia XT 09/10/18  Yes Johnson, Clanford L, MD  escitalopram (LEXAPRO) 10 MG tablet Take 10 mg by mouth daily. 12/28/18  Yes [provider]  ferrous sulfate (FERROUSUL) 325 (65 FE) MG tablet Take 1 tablet (325 mg total) by mouth 2 (two) times daily with a meal. 01/16/19  Yes Domenic Polite, MD  fluticasone [facility]) 50 MCG/ACT nasal spray Place 2 sprays into both nostrils daily as needed for allergies or rhinitis (congestion).    Yes [provider]  furosemide (LASIX) 20 MG tablet Take 2 tablets (40 mg total) by mouth 2 (two) times daily. 03/11/19 04/22/19 Yes Johnson, Clanford L, MD  glipiZIDE (GLUCOTROL) 10 MG tablet Take 10 mg by mouth 2 (two) times daily. 03/02/18  Yes [provider]  levalbuterol Penne Lash) 0.63 MG/3ML nebulizer solution Take 0.63 mg by nebulization every 6 (six) hours as needed for wheezing or shortness of breath.  02/08/19  Yes [provider]  LEVEMIR FLEXTOUCH 100 UNIT/ML Pen Inject 10 Units into the skin 2 (two) times a day. 01/05/19  Yes [provider]  metoprolol tartrate (LOPRESSOR) 50 MG tablet Take 50 mg by mouth 2 (two) times daily.   02/12/19  Yes [provider]  nystatin (MYCOSTATIN/NYSTOP) powder Apply topically daily as needed (rash in skin folds).   Yes [provider]  OVER THE COUNTER MEDICATION Apply 1 application topically daily as needed (rash in skin folds). Baby butt paste   Yes [provider]  pantoprazole (PROTONIX) 40 MG tablet Take 1 tablet (40 mg total) by mouth daily before breakfast. Patient taking differently: Take 40 mg by mouth daily as needed (for GERD/acid reflux).  09/11/18  Yes Johnson, Clanford L, MD  collagenase (SANTYL) ointment Apply 1 application topically 2 (two) times daily. Twice daily applications to the abdominal wound with damp to dry saline dressings Patient not taking: Reported on 04/22/2019 03/30/19   Virl Cagey, MD  oxyCODONE (OXY IR/ROXICODONE) 5 MG immediate release tablet Take 1 tablet (5 mg total) by mouth every 6 (six) hours as needed for severe pain (wound vacuum changes). Patient not taking: Reported on 04/22/2019 04/06/19   Virl Cagey, MD    Family History Family History  Problem Relation Age of Onset  . Diabetes Mother        died in her 17's of a stroke  . Cancer Mother   . Stroke Mother   . Breast cancer Mother   . Diabetes Father        died in his 59's of a stroke.  . Stroke Father   . Diabetes Brother        died @ 53 of a stroke.  . Stroke Brother   . Diabetes Maternal Grandmother   . Diabetes Maternal Grandfather   . Diabetes Paternal Grandmother   . Diabetes Paternal Grandfather   . Breast cancer Maternal Aunt     Social History Social History   Tobacco Use  . Smoking status: Never Smoker  . Smokeless tobacco: Never Used  Substance Use Topics  . Alcohol use: No  . Drug use: No     Allergies   Feraheme [ferumoxytol], Propranolol, Topamax [topiramate], and Latex   Review of Systems Review of Systems  Constitutional: Negative for fever.  Respiratory: Negative for cough and shortness of breath.    Cardiovascular: Negative for chest pain.  Gastrointestinal: Positive for abdominal pain and nausea. Negative for vomiting.  Genitourinary: Negative for dysuria and hematuria.  Musculoskeletal: Positive for myalgias.  Neurological: Negative for headaches.  All other systems reviewed and are negative.    Physical Exam Updated Vital Signs BP (!) 142/109  Pulse (!) 121   Temp 97.8 F (36.6 C) (Oral)   Resp 18   Ht 5' 4"  (1.626 m)   Wt 89.4 kg   SpO2 97%   BMI 33.81 kg/m   Physical Exam Vitals signs and nursing note reviewed.  Constitutional:      Appearance: Normal appearance. She is well-developed.  HENT:     Head: Normocephalic and atraumatic.  Eyes:     General: Lids are normal.     Conjunctiva/sclera: Conjunctivae normal.     Pupils: Pupils are equal, round, and reactive to light.  Neck:     Musculoskeletal: Full passive range of motion without pain.  Cardiovascular:     Rate and Rhythm: Tachycardia present. Rhythm regularly irregular.     Pulses: Normal pulses.          Radial pulses are 2+ on the right side and 2+ on the left side.       Dorsalis pedis pulses are 2+ on the right side and 2+ on the left side.     Heart sounds: Normal heart sounds. No murmur. No friction rub. No gallop.   Pulmonary:     Effort: Pulmonary effort is normal.     Breath sounds: Normal breath sounds.  Abdominal:     Palpations: Abdomen is soft. Abdomen is not rigid.     Tenderness: There is no abdominal tenderness. There is no guarding.     Comments: Wound vac noted to lower abdomen.  No surrounding warmth, erythema.  Generalized tenderness noted throughout with no focal point. No rigidity. No guarding.   Musculoskeletal: Normal range of motion.  Skin:    General: Skin is warm and dry.     Capillary Refill: Capillary refill takes less than 2 seconds.  Neurological:     Mental Status: She is alert and oriented to person, place, and time.  Psychiatric:        Speech: Speech normal.       ED Treatments / Results  Labs (all labs ordered are listed, but only abnormal results are displayed) Labs Reviewed  BASIC METABOLIC PANEL - Abnormal; Notable for the following components:      Result Value   Sodium 133 (*)    CO2 19 (*)    Glucose, Bld 348 (*)    Creatinine, Ser 1.08 (*)    Calcium 7.9 (*)    GFR calc non Af Amer 53 (*)    All other components within normal limits  CBC - Abnormal; Notable for the following components:   WBC 13.5 (*)    RBC 3.63 (*)    Hemoglobin 9.5 (*)    HCT 31.9 (*)    MCHC 29.8 (*)    Platelets 635 (*)    nRBC 0.8 (*)    All other components within normal limits  LACTIC ACID, PLASMA - Abnormal; Notable for the following components:   Lactic Acid, Venous 2.3 (*)    All other components within normal limits  HEPATIC FUNCTION PANEL - Abnormal; Notable for the following components:   Total Protein 5.9 (*)    Albumin 2.4 (*)    AST 10 (*)    Alkaline Phosphatase 131 (*)    All other components within normal limits  CBG MONITORING, ED - Abnormal; Notable for the following components:   Glucose-Capillary 318 (*)    All other components within normal limits  LACTIC ACID, PLASMA  URINALYSIS, ROUTINE W REFLEX MICROSCOPIC    EKG EKG Interpretation  Date/Time:  Friday April 22 2019 18:54:07 EDT Ventricular Rate:  174 PR Interval:    QRS Duration: 79 QT Interval:  276 QTC Calculation: 470 R Axis:   43 Text Interpretation:  Atrial fibrillation with rapid V-rate Repolarization abnormality, prob rate related Baseline wander in lead(s) V2 V3 V4 Confirmed by [name] [phone]) on 04/22/2019 8:20:11 PM   Radiology No results found.  Procedures Procedures (including critical care time)  Medications Ordered in ED Medications  ondansetron (ZOFRAN) injection 4 mg (4 mg Intravenous Given 04/22/19 1908)  diltiazem (CARDIZEM) injection 10 mg (10 mg Intravenous Given 04/22/19 1908)  sodium chloride 0.9 % bolus 1,000 mL (1,000 mLs  Intravenous New Bag/Given 04/22/19 1949)  diltiazem (CARDIZEM) injection 10 mg (10 mg Intravenous Given 04/22/19 1949)  iohexol (OMNIPAQUE) 350 MG/ML injection 100 mL (100 mLs Intravenous Contrast Given 04/22/19 2131)     Initial Impression / Assessment and Plan / ED Course  I have reviewed the triage vital signs and the nursing notes.  Pertinent labs & imaging results that were available during my care of the patient were reviewed by me and considered in my medical decision making (see chart for details).        68 year old female who presents for evaluation of generalized malaise, nausea, abdominal pain.  Recent surgery on 03/09/2019 for evaluation of abdominal wound.  Some prednisone for treatment of wound but states she stopped because it was making her blood sugars go high.  No fevers.  No chest pain, difficulty breathing.  On initial ED arrival, she is afebrile but is very tachycardic with irregularly irregular rhythm. Unclear if this is from infectious vs afib with RVR vs dehydration.  She does have a history of A. fib.  Concern with A. fib with RVR on EKG.  Also consider infectious process versus DKA.  Plan for labs, imaging.  Lactic acid elevated 2.3.  BMP showed creatinine of 1.08, bicarb of 19.  Anion gap is 13.  Glucose is 348.  LFTs are unremarkable.  CBC does show slight leukocytosis of 13.5.  Hemoglobin is 9.5.  Review of records shows consistent with previous and is actually up from her previous hemoglobin about 1 month ago.  Patient given a diltiazem bolus which dropped her down to the 120s.  She then spiked back up into the 150s.  Will give additional bolus as well as fluids and reassess.  RN informing that since patient has been nauseous, she had not been taking her Eliquis or metoprolol.  We will hold off on tilt that bolus until we get CTA for evaluation of PE. Will hold on patient's dilt drip until we evaluate for PE.   Patient signed out to Alyse Low, PA-C pending imaging and  re-eval.   Portions of this note were generated with [employer]. Dictation errors may occur despite best attempts at proofreading.    Final Clinical Impressions(s) / ED Diagnoses   Final diagnoses:  Atrial fibrillation with RVR [facility])  Hyperglycemia    ED Discharge Orders    None       Desma Mcgregor 04/22/19 2208    [name], MD 04/22/19 2241

## 2019-04-22 NOTE — Patient Outreach (Signed)
[facility]) Care Management  04/22/2019  [name] [birth year] [id number]    No current facility-administered medications on file prior to visit.    Current Outpatient Medications on File Prior to Visit  Medication Sig Dispense Refill  . acetaminophen (TYLENOL) 500 MG tablet Take 500-1,000 mg by mouth every 8 (eight) hours as needed for mild pain or headache.     . albuterol (PROVENTIL HFA;VENTOLIN HFA) 108 (90 Base) MCG/ACT inhaler Inhale 2 puffs into the lungs every 6 (six) hours as needed for wheezing or shortness of breath. (Patient taking differently: Inhale 2 puffs into the lungs every 6 (six) hours as needed for wheezing or shortness of breath (As needed). ) 1 Inhaler 2  . apixaban (ELIQUIS) 5 MG TABS tablet Take 1 tablet (5 mg total) by mouth 2 (two) times daily. 60 tablet 3  . buPROPion (WELLBUTRIN XL) 300 MG 24 hr tablet Take 300 mg by mouth daily.     . collagenase (SANTYL) ointment Apply 1 application topically 2 (two) times daily. Twice daily applications to the abdominal wound with damp to dry saline dressings (Patient not taking: Reported on 04/22/2019) 15 g 1  . diltiazem (CARDIZEM CD) 120 MG 24 hr capsule Take 1 capsule (120 mg total) by mouth daily. (Patient taking differently: Take 120 mg by mouth daily. Cartia XT)    . escitalopram (LEXAPRO) 10 MG tablet Take 10 mg by mouth daily.    . ferrous sulfate (FERROUSUL) 325 (65 FE) MG tablet Take 1 tablet (325 mg total) by mouth 2 (two) times daily with a meal. 60 tablet 0  . fluticasone (FLONASE) 50 MCG/ACT nasal spray Place 2 sprays into both nostrils daily as needed for allergies or rhinitis (congestion).     . furosemide (LASIX) 20 MG tablet Take 2 tablets (40 mg total) by mouth 2 (two) times daily. 120 tablet 0  . glipiZIDE (GLUCOTROL) 10 MG tablet Take 10 mg by mouth 2 (two) times daily.  2  . levalbuterol (XOPENEX) 0.63 MG/3ML nebulizer solution Take 0.63 mg by nebulization every 6 (six) hours as needed  for wheezing or shortness of breath.     Marland Kitchen LEVEMIR FLEXTOUCH 100 UNIT/ML Pen Inject 10 Units into the skin 2 (two) times a day.    . metoprolol tartrate (LOPRESSOR) 50 MG tablet Take 50 mg by mouth 2 (two) times daily.     Marland Kitchen nystatin (MYCOSTATIN/NYSTOP) powder Apply topically daily as needed (rash in skin folds).    Marland Kitchen OVER THE COUNTER MEDICATION Apply 1 application topically daily as needed (rash in skin folds). Baby butt paste    . oxyCODONE (OXY IR/ROXICODONE) 5 MG immediate release tablet Take 1 tablet (5 mg total) by mouth every 6 (six) hours as needed for severe pain (wound vacuum changes). (Patient not taking: Reported on 04/22/2019) 25 tablet 0  . pantoprazole (PROTONIX) 40 MG tablet Take 1 tablet (40 mg total) by mouth daily before breakfast. (Patient taking differently: Take 40 mg by mouth daily as needed (for GERD/acid reflux). )     Brief outreach with [name]. She is not feeling well today. Reports several episodes of nausea and inability to tolerate food for the last three days. Also noted significant fluctuations with blood glucose readings. Reports a range from 53m/dl to over 50106mdl over the past few days.  She denies complaints of shortness of breath. No complaints of chest pain or palpitations. Denies headaches or dizziness.  Reports a BP of 134/75.  She reports completing  a follow-up evaluation with General Surgery and discussing her symptoms on yesterday. She was instructed to go to the Emergency Department if her blood glucose readings remain elevated and her symptoms worsen. She plans to contact her husband and report to the ED for further evaluation.  PLAN -Pending evaluation in ED. -Will follow-up next week.  [facility] [phone]

## 2019-04-22 NOTE — ED Provider Notes (Signed)
Pt's care assumed at 10 pm.  Pt's ct abdomen and chest pending.  Pt is in afiv with rvr.  Pt given cardizem bolus but drip not started yet pending CTA.  Pt has a wound vac on abdominal wound.  Pt saw Dr. Mendel Ryder yesterday.    Ct angio chest and ct abdomen reviewed.  Pt placed on cardizem drip.    Hospitalist consulted    [name] 04/22/19 2311    [name], MD 04/25/19 [phone]

## 2019-04-22 NOTE — ED Notes (Addendum)
Date and time results received: 04/22/19 1940  Test: lactic Critical Value: 2.3  Name of Provider Notified: Ria Comment, [place]

## 2019-04-22 NOTE — ED Notes (Signed)
Patient placed on monitor - pulse rate 167.  12-lead done.  Doctor notified and also advised him that patient stated she had severe nausea

## 2019-04-22 NOTE — ED Triage Notes (Signed)
Pt presents to ED with complaints of hyperglycemia. Pt states BS was 500 earlier. Pt states she is nauseated. Pt states she has been on prednisone recently.

## 2019-04-23 DIAGNOSIS — F329 Major depressive disorder, single episode, unspecified: Secondary | ICD-10-CM

## 2019-04-23 DIAGNOSIS — R739 Hyperglycemia, unspecified: Secondary | ICD-10-CM

## 2019-04-23 DIAGNOSIS — I10 Essential (primary) hypertension: Secondary | ICD-10-CM

## 2019-04-23 LAB — COMPREHENSIVE METABOLIC PANEL
ALT: 11 U/L (ref 0–44)
AST: 9 U/L — ABNORMAL LOW (ref 15–41)
Albumin: 2.4 g/dL — ABNORMAL LOW (ref 3.5–5.0)
Alkaline Phosphatase: 124 U/L (ref 38–126)
Anion gap: 10 (ref 5–15)
BUN: 22 mg/dL (ref 8–23)
CO2: 21 mmol/L — ABNORMAL LOW (ref 22–32)
Calcium: 7.8 mg/dL — ABNORMAL LOW (ref 8.9–10.3)
Chloride: 102 mmol/L (ref 98–111)
Creatinine, Ser: 0.99 mg/dL (ref 0.44–1.00)
GFR calc Af Amer: >60 mL/min (ref >60)
GFR calc non Af Amer: 59 mL/min — ABNORMAL LOW (ref >60)
Glucose, Bld: 306 mg/dL — ABNORMAL HIGH (ref 70–99)
Potassium: 4.4 mmol/L (ref 3.5–5.1)
Sodium: 133 mmol/L — ABNORMAL LOW (ref 135–145)
Total Bilirubin: 0.8 mg/dL (ref 0.3–1.2)
Total Protein: 5.6 g/dL — ABNORMAL LOW (ref 6.5–8.1)

## 2019-04-23 LAB — CBC
HCT: 31.6 % — ABNORMAL LOW (ref 36.0–46.0)
Hemoglobin: 9.3 g/dL — ABNORMAL LOW (ref 12.0–15.0)
MCH: 26.7 pg (ref 26.0–34.0)
MCHC: 29.4 g/dL — ABNORMAL LOW (ref 30.0–36.0)
MCV: 90.8 fL (ref 80.0–100.0)
Platelets: 615 10*3/uL — ABNORMAL HIGH (ref 150–400)
RBC: 3.48 MIL/uL — ABNORMAL LOW (ref 3.87–5.11)
RDW: 15.3 % (ref 11.5–15.5)
WBC: 12.8 10*3/uL — ABNORMAL HIGH (ref 4.0–10.5)
nRBC: 0.8 % — ABNORMAL HIGH (ref 0.0–0.2)

## 2019-04-23 LAB — TROPONIN I (HIGH SENSITIVITY)
Troponin I (High Sensitivity): 10 ng/L (ref <18)
Troponin I (High Sensitivity): 11 ng/L (ref <18)

## 2019-04-23 LAB — GLUCOSE, CAPILLARY
Glucose-Capillary: 105 mg/dL — ABNORMAL HIGH (ref 70–99)
Glucose-Capillary: 145 mg/dL — ABNORMAL HIGH (ref 70–99)
Glucose-Capillary: 186 mg/dL — ABNORMAL HIGH (ref 70–99)
Glucose-Capillary: 215 mg/dL — ABNORMAL HIGH (ref 70–99)
Glucose-Capillary: 26 mg/dL — CL (ref 70–99)
Glucose-Capillary: 297 mg/dL — ABNORMAL HIGH (ref 70–99)
Glucose-Capillary: 51 mg/dL — ABNORMAL LOW (ref 70–99)
Glucose-Capillary: 53 mg/dL — ABNORMAL LOW (ref 70–99)
Glucose-Capillary: 56 mg/dL — ABNORMAL LOW (ref 70–99)
Glucose-Capillary: 68 mg/dL — ABNORMAL LOW (ref 70–99)
Glucose-Capillary: 92 mg/dL (ref 70–99)

## 2019-04-23 LAB — HEMOGLOBIN A1C
Hgb A1c MFr Bld: 9.9 % — ABNORMAL HIGH (ref 4.8–5.6)
Mean Plasma Glucose: 237.43 mg/dL

## 2019-04-23 LAB — DIFFERENTIAL
Basophils Absolute: 0 10*3/uL (ref 0.0–0.1)
Basophils Relative: 0 %
Eosinophils Absolute: 0 10*3/uL (ref 0.0–0.5)
Eosinophils Relative: 0 %
Lymphocytes Relative: 7 %
Lymphs Abs: 1 10*3/uL (ref 0.7–4.0)
Monocytes Absolute: 0.5 10*3/uL (ref 0.1–1.0)
Monocytes Relative: 4 %
Neutro Abs: 11.3 10*3/uL — ABNORMAL HIGH (ref 1.7–7.7)
Neutrophils Relative %: 88 %

## 2019-04-23 LAB — MRSA PCR SCREENING: MRSA by PCR: POSITIVE — AB

## 2019-04-23 LAB — SARS CORONAVIRUS 2 BY RT PCR (HOSPITAL ORDER, PERFORMED IN ~~LOC~~ HOSPITAL LAB): SARS Coronavirus 2: NEGATIVE

## 2019-04-23 MED ORDER — PANTOPRAZOLE SODIUM 40 MG PO TBEC
40.0000 mg | DELAYED_RELEASE_TABLET | Freq: Every day | ORAL | Status: DC
  Administered 2019-04-23 – 2019-04-25 (×3): 40 mg via ORAL
  Filled 2019-04-23 (×3): qty 1

## 2019-04-23 MED ORDER — ESCITALOPRAM OXALATE 10 MG PO TABS
10.0000 mg | ORAL_TABLET | Freq: Every day | ORAL | Status: DC
  Administered 2019-04-23 – 2019-04-25 (×3): 10 mg via ORAL
  Filled 2019-04-23 (×3): qty 1

## 2019-04-23 MED ORDER — GLUCOSE-VITAMIN C 4-6 GM-MG PO CHEW
CHEWABLE_TABLET | ORAL | Status: AC
  Filled 2019-04-23: qty 1

## 2019-04-23 MED ORDER — ONDANSETRON HCL 4 MG/2ML IJ SOLN
4.0000 mg | Freq: Four times a day (QID) | INTRAMUSCULAR | Status: DC | PRN
  Administered 2019-04-23 – 2019-04-24 (×3): 4 mg via INTRAVENOUS
  Filled 2019-04-23 (×3): qty 2

## 2019-04-23 MED ORDER — SODIUM CHLORIDE 0.9 % IV BOLUS
500.0000 mL | Freq: Once | INTRAVENOUS | Status: AC
  Administered 2019-04-23: 500 mL via INTRAVENOUS

## 2019-04-23 MED ORDER — METOPROLOL TARTRATE 50 MG PO TABS
75.0000 mg | ORAL_TABLET | Freq: Two times a day (BID) | ORAL | Status: DC
  Administered 2019-04-23 – 2019-04-25 (×4): 75 mg via ORAL
  Filled 2019-04-23 (×4): qty 1

## 2019-04-23 MED ORDER — CHLORHEXIDINE GLUCONATE CLOTH 2 % EX PADS
6.0000 | MEDICATED_PAD | Freq: Every day | CUTANEOUS | Status: DC
  Administered 2019-04-24 – 2019-04-25 (×2): 6 via TOPICAL

## 2019-04-23 MED ORDER — DEXTROSE 5 % IV SOLN
INTRAVENOUS | Status: DC
  Administered 2019-04-23 – 2019-04-25 (×2): via INTRAVENOUS

## 2019-04-23 MED ORDER — FUROSEMIDE 40 MG PO TABS
40.0000 mg | ORAL_TABLET | Freq: Two times a day (BID) | ORAL | Status: DC
  Administered 2019-04-23 – 2019-04-25 (×6): 40 mg via ORAL
  Filled 2019-04-23 (×6): qty 1

## 2019-04-23 MED ORDER — AMOXICILLIN-POT CLAVULANATE 875-125 MG PO TABS
1.0000 | ORAL_TABLET | Freq: Two times a day (BID) | ORAL | Status: DC
  Administered 2019-04-23 – 2019-04-25 (×5): 1 via ORAL
  Filled 2019-04-23 (×5): qty 1

## 2019-04-23 MED ORDER — NYSTATIN 100000 UNIT/GM EX POWD: Freq: Every day | CUTANEOUS | Status: DC | PRN

## 2019-04-23 MED ORDER — INSULIN DETEMIR 100 UNIT/ML ~~LOC~~ SOLN
10.0000 [IU] | Freq: Two times a day (BID) | SUBCUTANEOUS | Status: DC
  Administered 2019-04-23: 10 [IU] via SUBCUTANEOUS
  Filled 2019-04-23 (×6): qty 0.1

## 2019-04-23 MED ORDER — OXYCODONE HCL 5 MG PO TABS
5.0000 mg | ORAL_TABLET | ORAL | Status: DC | PRN
  Administered 2019-04-23 – 2019-04-25 (×8): 10 mg via ORAL
  Filled 2019-04-23 (×8): qty 2

## 2019-04-23 MED ORDER — ALBUTEROL SULFATE (2.5 MG/3ML) 0.083% IN NEBU: 2.5000 mg | INHALATION_SOLUTION | Freq: Four times a day (QID) | RESPIRATORY_TRACT | Status: DC | PRN

## 2019-04-23 MED ORDER — DEXTROSE 50 % IV SOLN
1.0000 | Freq: Once | INTRAVENOUS | Status: AC
  Administered 2019-04-23 (×2): 50 mL via INTRAVENOUS

## 2019-04-23 MED ORDER — JUVEN PO PACK
1.0000 | PACK | Freq: Two times a day (BID) | ORAL | Status: DC
  Administered 2019-04-23 – 2019-04-25 (×4): 1 via ORAL
  Filled 2019-04-23 (×4): qty 1

## 2019-04-23 MED ORDER — CHLORHEXIDINE GLUCONATE CLOTH 2 % EX PADS
6.0000 | MEDICATED_PAD | Freq: Every day | CUTANEOUS | Status: DC
  Administered 2019-04-23 – 2019-04-25 (×3): 6 via TOPICAL

## 2019-04-23 MED ORDER — FERROUS SULFATE 325 (65 FE) MG PO TABS
325.0000 mg | ORAL_TABLET | Freq: Two times a day (BID) | ORAL | Status: DC
  Administered 2019-04-23 – 2019-04-25 (×6): 325 mg via ORAL
  Filled 2019-04-23 (×6): qty 1

## 2019-04-23 MED ORDER — MUPIROCIN 2 % EX OINT
1.0000 "application " | TOPICAL_OINTMENT | Freq: Two times a day (BID) | CUTANEOUS | Status: DC
  Administered 2019-04-23 – 2019-04-25 (×5): 1 via NASAL
  Filled 2019-04-23 (×3): qty 22

## 2019-04-23 MED ORDER — BUPROPION HCL ER (XL) 300 MG PO TB24
300.0000 mg | ORAL_TABLET | Freq: Every day | ORAL | Status: DC
  Administered 2019-04-23 – 2019-04-25 (×3): 300 mg via ORAL
  Filled 2019-04-23 (×2): qty 2
  Filled 2019-04-23: qty 1

## 2019-04-23 MED ORDER — BOOST / RESOURCE BREEZE PO LIQD CUSTOM
1.0000 | Freq: Three times a day (TID) | ORAL | Status: DC
  Administered 2019-04-23 – 2019-04-25 (×5): 1 via ORAL

## 2019-04-23 MED ORDER — OXYCODONE HCL 5 MG PO TABS
5.0000 mg | ORAL_TABLET | Freq: Once | ORAL | Status: AC
  Administered 2019-04-23: 12:00:00 5 mg via ORAL
  Filled 2019-04-23: qty 1

## 2019-04-23 MED ORDER — OXYCODONE HCL 5 MG PO TABS: 5.0000 mg | ORAL_TABLET | ORAL | Status: DC | PRN

## 2019-04-23 MED ORDER — INSULIN DETEMIR 100 UNIT/ML ~~LOC~~ SOLN
15.0000 [IU] | Freq: Two times a day (BID) | SUBCUTANEOUS | Status: DC
  Administered 2019-04-23: 09:00:00 15 [IU] via SUBCUTANEOUS
  Filled 2019-04-23 (×5): qty 0.15

## 2019-04-23 MED ORDER — DEXTROSE 50 % IV SOLN
INTRAVENOUS | Status: AC
  Administered 2019-04-23: 17:00:00 50 mL
  Filled 2019-04-23: qty 50

## 2019-04-23 MED ORDER — METOPROLOL TARTRATE 50 MG PO TABS
50.0000 mg | ORAL_TABLET | Freq: Two times a day (BID) | ORAL | Status: DC
  Administered 2019-04-23 (×2): 50 mg via ORAL
  Filled 2019-04-23 (×2): qty 1

## 2019-04-23 MED ORDER — OCUVITE-LUTEIN PO CAPS
1.0000 | ORAL_CAPSULE | Freq: Every day | ORAL | Status: DC
  Administered 2019-04-23 – 2019-04-25 (×3): 1 via ORAL
  Filled 2019-04-23 (×3): qty 1

## 2019-04-23 MED ORDER — FLUTICASONE PROPIONATE 50 MCG/ACT NA SUSP: 2.0000 | Freq: Every day | NASAL | Status: DC | PRN

## 2019-04-23 NOTE — Progress Notes (Signed)
ANTICOAGULATION CONSULT NOTE - Initial Consult  Pharmacy Consult for apixaban Indication: atrial fibrillation  Allergies  Allergen Reactions  . Feraheme [Ferumoxytol] Other (See Comments)    Back pain (yelling out with back pain)  . Propranolol Swelling    Pt states it may have been leg swelling  . Topamax [Topiramate] Other (See Comments)    hallucinations  . Latex Itching and Rash    Patient Measurements: Height: 5' 4"  (162.6 cm) Weight: 197 lb (89.4 kg) IBW/kg (Calculated) : 54.7 Heparin Dosing Weight: 74.7 kg  Vital Signs: Temp: 97.8 F (36.6 C) (09/04 1752) Temp Source: Oral (09/04 1752) BP: 122/98 (09/05 0030) Pulse Rate: 97 (09/05 0030)  Labs: Recent Labs    04/22/19 1847 04/22/19 2335  HGB 9.5*  --   HCT 31.9*  --   PLT 635*  --   CREATININE 1.08*  --   TROPONINIHS  --  10    Estimated Creatinine Clearance: 54 mL/min (A) (by C-G formula based on SCr of 1.08 mg/dL (H)).   Medical History: Past Medical History:  Diagnosis Date  . Atrial fibrillation ([place])   . CHF (congestive heart failure) ([place])   . Depression   . Diabetes mellitus without complication ([place])   . 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 Hypertension   . Morbid obesity ([place])   . Osteoporosis   . Urinary incontinence   . UTI (lower urinary tract infection) 01/2016   Cipro for Klebsiella pneumoniae isolate  . Wheelchair bound    bedbound    Assessment: [name] is a 47yoF presented with elevated blood sugar and was noted to be in AFib with RVR. Patient PMH includes AFib on chronic anticoagulation with apixaban. Pharmacy consulted to dose/monitor apixaban while inpatient. Last dose PTA 9/3 @ 1200. Patient has been having trouble with PO intake.  Goal of Therapy:  Anticoagulation Monitor platelets by anticoagulation protocol: Yes   Plan:  Restart PTA apixaban 5 mg PO BID Monitor CBC, renal function, and for signs and symptoms of  bleeding Pharmacy to sign off and follow peripherally   Thank you for allowing [place] to participate in this patients care. Jens Som, PharmD 04/23/2019,1:25 AM

## 2019-04-23 NOTE — Progress Notes (Signed)
[facility]  Patient well known to me with a RLQ abdominal wound s/p debridement for concern for pyoderm gangrenosum in an infected chronic wound bed. Patient admitted with hypoglycemia and A fib with RVR. CT done and demonstrated fluid collection in the medial part of her wound. Probed this and there was a purulent pocket. Cultures obtained and sent. Wound vacuum changed at bedside and black sponge into the cavity and remaining wound.  Vac to 125 mm hg negative pressure. Likely home tomorrow per Dr. Dyann Kief. Can resume home vac changes with Home health RN. Have notified Home Health RN of the cavity.   Augmentin ordered and will need 10 day course given the wound and diabetes. Patient has follow up with me. Is also suppose to be seeing dermatology for the pyoderm gangrenosum and prednisone management but has not seen them yet. Has been on and off prednisone per her report due to BS.   Future Appointments  Date Time Provider [place]  04/26/2019  1:00 PM Neldon Labella, RN [facility] None  05/12/2019  3:30 PM Virl Cagey, MD [facility] None  08/04/2019 11:00 AM Herminio Commons, MD [facility], MD [facility] [address] [place], [place] [address] [phone] (office)

## 2019-04-23 NOTE — Progress Notes (Signed)
Initial Nutrition Assessment  DOCUMENTATION CODES:   Obesity unspecified  INTERVENTION:  -Boost Breeze po TID, each supplement provides 250 kcal and 9 grams of protein  -Juven BID, each packet provides 95 calories, 2.5 grams of protein (collagen), and 9.8 grams of carbohydrate (3 grams sugar); also contains 7 grams of L-arginine and L-glutamine, 300 mg vitamin C, 15 mg vitamin E, 1.2 mcg vitamin B-12, 9.5 mg zinc, 200 mg calcium, and 1.5 g  Calcium Beta-hydroxy-Beta-methylbutyrate to support wound healing  -MVI daily   NUTRITION DIAGNOSIS:   Increased nutrient needs related to wound healing as evidenced by estimated needs.   GOAL:   Patient will meet greater than or equal to 90% of their needs   MONITOR:   PO intake, Weight trends, Supplement acceptance, I & O's, Labs, Skin  REASON FOR ASSESSMENT:   Malnutrition Screening Tool    ASSESSMENT:  RD working remotely.  68 year old female with HTN, T2DM, CKD3, CHF, a-fib, COPD, depression, recent 7/20 admission for chronic abdominal wound with new diagnosis of pyoderma gangrenosum on chronic prednisone therapy. Pt presented to ED for evaluation of blood sugar >500; noted to have Afib with RVR on arrival and reported poor oral intake over the past few days prior to presentation.  Abdominal CT demonstrated fluid collection in medial part of wound; cultures obtained, wound vac changed at bedside  Unable to obtain nutrition history at this time. Per chart review pt provided history to RD in April. At that time patient recalled  half a pancake and a lemon icy for breakfast. Typically consumed 2 meals per day at home with snacks. Reported not usually drinking any nutrition supplements; Dislike of milky drinks but amenable to trying Boost Breeze. RD will order Boost Breeze TID supplement to assist with calorie and protein needs; Juven BID to  promote wound healing.   Weight history reviewed; noted 30 lb wt loss over the past 3 months  (13%; severe for time frame) Per [facility] evaluation notes, pt wt loss attributed to lasix regimen.   Medications reviewed and include: augmentin, ferrous sulfate, lasix, SSI, levemir 15 units BID, lopressor, protonix  Labs: CBGS 68 - 318 Na 133 Ca corrects for low albumin 9.1 WBC 12.8 - trending down  NUTRITION - FOCUSED PHYSICAL EXAM: Unable to complete at this time; RD working remotely.    Diet Order:   Diet Order            Diet heart healthy/carb modified Room service appropriate? Yes; Fluid consistency: Thin  Diet effective now              EDUCATION NEEDS:   No education needs have been identified at this time  Skin:  Skin Assessment: Reviewed RN Assessment(open wound; rt;lower;abdomen, serous blister; left; leg)  Last BM:  9/5  Height:   Ht Readings from Last 1 Encounters:  04/22/19 5' 4"  (1.626 m)    Weight:   Wt Readings from Last 1 Encounters:  04/23/19 92.3 kg    Ideal Body Weight:  54.5 kg  BMI:  Body mass index is 34.93 kg/m.  Estimated Nutritional Needs:   Kcal:  2025-2300  Protein:  101-115  Fluid:  >2L   Lajuan Lines, RD, LDN [place] Telephone [phone] After Hours/Weekend Pager: [phone]

## 2019-04-23 NOTE — Progress Notes (Signed)
After administration of D50 and initiation of D5W at 10cc/hr recheck of cbg shows glucose of 186.

## 2019-04-23 NOTE — Progress Notes (Addendum)
Critical blood glucose of 26, patient sweaty, unable to verbalize, but able to nod to questions asked.   Patient received one amp of D50 and some orange juice. Patient now responding verbally. Will recheck blood sugar and continue to monitor patient.   On recheck, blood sugar was 145.

## 2019-04-23 NOTE — Progress Notes (Signed)
PROGRESS NOTE    [name]  DTO:[id number] DOB: June 04, 1951 DOA: 04/22/2019 PCP: [name], MD     Brief Narrative:  68 y.o. female,  w hyeprtension, Dm2, CKD stage3, diastolic CHF , Pafib, Copd, Depression, w recent admission for chronic abdominal wound 03/07/2019 w new diagnosis of pyoderma gangrenosum on chronic prednisone therapy apparently had bs >500 earlier today and therefore came to ER. Pt was noted on arrival to have Afib with RVR.    Pt notes that she didn't take her metoprolol this am due to nausea.  Pt has not been able to eat well over the past few days.   Pt denies fever, chills, cough, cp, palp, sob, emesis, abd pain, diarrhea, brbpr, dysuria.   In ED,  Temperature 97.8 pulse 198 respiratory rate 20 blood pressure 120/110 pulse ox 96% on room air weight 89.4 kg   Assessment & Plan: 1-atrial fibrillation with RVR ([place]) -Most likely trigger from dehydration in the setting of hyperglycemia -Patient required to be placed on Cardizem drip -Continue weaning off as tolerated -Continue telemetry monitoring -Electrolytes appears to be stable -Metoprolol dose has been adjusted. -Continue IV fluids to maintain adequate hydration.  2-type 2 diabetes mellitus with hyperglycemia -Most likely triggered by the use of prednisone as an outpatient -Blood sugar in the 500 range on presentation -Currently down to 200s -Continue adjusting insulin therapy -Continue holding oral hypoglycemic agents. -Follow CBGs. -check A1C; most recent one was from 138 days back (7.8)  3-HTN (hypertension) -Soft vitals stable -Continue monitoring vital signs.  4-Depression -Stable mood -No suicidal ideation or hallucination -Continue Lexapro and Wellbutrin  5-GERD -continue PPI  6-chronic kidney disease is stage III -Appears to be stable at baseline -Continue monitoring electrolytes and renal function trend.  7-pyoderma gangrenosum -Follow general surgery  recommendation -There is a small fluid collection 4.2 cm, evaluation of CBCs; patient is afebrile and most likely secondary to seroma. -Continue holding antibiotics for now. -Continue wound care.   DVT prophylaxis: Chronically on Eliquis. Code Status: Full code Family Communication: No family bedside Disposition Plan: Continue adjusting insulin therapy, follow CBGs, for now continue holding antibiotics.  Continue weaning of Cardizem drip.  Follow general surgery recommendations.  Consultants:   General surgery  Procedures:   See below for x-ray reports  Antimicrobials:  Anti-infectives (From admission, onward)   None      Subjective: Afebrile, no chest pain, no shortness of breath, no nausea, no vomiting.  Reports feeling better.  Objective: Vitals:   04/23/19 0500 04/23/19 0600 04/23/19 0852 04/23/19 0900  BP: 117/72 114/70 125/73   Pulse: 62 72 72   Resp: 12 19    Temp:    97.7 F (36.5 C)  TempSrc:    Axillary  SpO2: 95% 100%    Weight:      Height:        Intake/Output Summary (Last 24 hours) at 04/23/2019 1048 Last data filed at 04/23/2019 0600 Gross per 24 hour  Intake 87.51 ml  Output --  Net 87.51 ml   Filed Weights   04/22/19 1753 04/23/19 0400  Weight: 89.4 kg 92.3 kg    Examination: General exam: Alert, awake, oriented x 3; afebrile and in no major distress.  Denies palpitations currently.  No nausea, no vomiting, no chest pain. Respiratory system: Clear to auscultation. Respiratory effort normal. Cardiovascular system: Irregular, no rubs, no gallops, unable to assess JVD with body habitus. Gastrointestinal system: Abdomen is nondistended, soft and mildly tender to palpation in the  inner corner of the skin with the wound VAC is placed.  Mild bump appreciated on examination.  Positive bowel sounds. Central nervous system: Alert and oriented. No focal neurological deficits. Extremities: No C/C/E, +pedal pulses Skin: No rashes, no petechiae.  Wound VAC  appreciated in her right lower quadrant/mid abdomen. Psychiatry: Judgement and insight appear normal. Mood & affect appropriate.     Data Reviewed: I have personally reviewed following labs and imaging studies  CBC: Recent Labs  Lab 04/22/19 1847 04/23/19 0337  WBC 13.5* 12.8*  NEUTROABS  --  11.3*  HGB 9.5* 9.3*  HCT 31.9* 31.6*  MCV 87.9 90.8  PLT 635* 591*   Basic Metabolic Panel: Recent Labs  Lab 04/22/19 1847 04/23/19 0337  NA 133* 133*  K 4.5 4.4  CL 101 102  CO2 19* 21*  GLUCOSE 348* 306*  BUN 22 22  CREATININE 1.08* 0.99  CALCIUM 7.9* 7.8*   GFR: Estimated Creatinine Clearance: 59.8 mL/min (by C-G formula based on SCr of 0.99 mg/dL).   Liver Function Tests: Recent Labs  Lab 04/22/19 1847 04/23/19 0337  AST 10* 9*  ALT 10 11  ALKPHOS 131* 124  BILITOT 0.6 0.8  PROT 5.9* 5.6*  ALBUMIN 2.4* 2.4*   CBG: Recent Labs  Lab 04/22/19 1752 04/23/19 0331 04/23/19 0809  GLUCAP 318* 297* 215*   Urine analysis:    Component Value Date/Time   COLORURINE YELLOW 03/07/2019 2104   APPEARANCEUR HAZY (A) 03/07/2019 2104   LABSPEC 1.024 03/07/2019 2104   PHURINE 6.0 03/07/2019 2104   GLUCOSEU NEGATIVE 03/07/2019 2104   HGBUR NEGATIVE 03/07/2019 2104   BILIRUBINUR NEGATIVE 03/07/2019 2104   KETONESUR NEGATIVE 03/07/2019 2104   PROTEINUR 100 (A) 03/07/2019 2104   UROBILINOGEN 0.2 12/01/2007 1025   NITRITE NEGATIVE 03/07/2019 2104   LEUKOCYTESUR MODERATE (A) 03/07/2019 2104    Recent Results (from the past 240 hour(s))  SARS Coronavirus 2 [facility] order, Performed in [facility] hospital lab) Nasopharyngeal Nasopharyngeal Swab     Status: None   Collection Time: 04/23/19 12:03 AM   Specimen: Nasopharyngeal Swab  Result Value Ref Range Status   SARS Coronavirus 2 NEGATIVE NEGATIVE Final    Comment: (NOTE) If result is NEGATIVE SARS-CoV-2 target nucleic acids are NOT DETECTED. The SARS-CoV-2 RNA is generally detectable in upper and lower  respiratory  specimens during the acute phase of infection. The lowest  concentration of SARS-CoV-2 viral copies this assay can detect is 250  copies / mL. A negative result does not preclude SARS-CoV-2 infection  and should not be used as the sole basis for treatment or other  patient management decisions.  A negative result may occur with  improper specimen collection / handling, submission of specimen other  than nasopharyngeal swab, presence of viral mutation(s) within the  areas targeted by this assay, and inadequate number of viral copies  (<250 copies / mL). A negative result must be combined with clinical  observations, patient history, and epidemiological information. If result is POSITIVE SARS-CoV-2 target nucleic acids are DETECTED. The SARS-CoV-2 RNA is generally detectable in upper and lower  respiratory specimens dur ing the acute phase of infection.  Positive  results are indicative of active infection with SARS-CoV-2.  Clinical  correlation with patient history and other diagnostic information is  necessary to determine patient infection status.  Positive results do  not rule out bacterial infection or co-infection with other viruses. If result is PRESUMPTIVE POSTIVE SARS-CoV-2 nucleic acids MAY BE PRESENT.   A  presumptive positive result was obtained on the submitted specimen  and confirmed on repeat testing.  While 2019 novel coronavirus  (SARS-CoV-2) nucleic acids may be present in the submitted sample  additional confirmatory testing may be necessary for epidemiological  and / or clinical management purposes  to differentiate between  SARS-CoV-2 and other Sarbecovirus currently known to infect humans.  If clinically indicated additional testing with an alternate test  methodology [phone]) is advised. The SARS-CoV-2 RNA is generally  detectable in upper and lower respiratory sp ecimens during the acute  phase of infection. The expected result is Negative. Fact Sheet for  Patients:  StrictlyIdeas.no Fact Sheet for Healthcare Providers: BankingDealers.co.za This test is not yet approved or cleared by the [place] FDA and has been authorized for detection and/or diagnosis of SARS-CoV-2 by FDA under an Emergency Use Authorization (EUA).  This EUA will remain in effect (meaning this test can be used) for the duration of the COVID-19 declaration under Section 564(b)(1) of the Act, 21 U.S.C. section 360bbb-3(b)(1), unless the authorization is terminated or revoked sooner. Performed at [facility], [address], [place], [place] [address]      Radiology Studies: Ct Angio Chest Pe W And/or Wo Contrast  Result Date: 04/22/2019 CLINICAL DATA:  Abdominal pain, acute shortness of breath EXAM: CT ANGIOGRAPHY CHEST CT ABDOMEN AND PELVIS WITH CONTRAST TECHNIQUE: Multidetector CT imaging of the chest was performed using the standard protocol during bolus administration of intravenous contrast. Multiplanar CT image reconstructions and MIPs were obtained to evaluate the vascular anatomy. Multidetector CT imaging of the abdomen and pelvis was performed using the standard protocol during bolus administration of intravenous contrast. CONTRAST:  168m OMNIPAQUE IOHEXOL 350 MG/ML SOLN COMPARISON:  CT abdomen pelvis March 07, 2019 FINDINGS: CTA CHEST FINDINGS Cardiovascular: There is a optimal opacification of the pulmonary arteries. There is no central,segmental, or subsegmental filling defects within the pulmonary arteries. There is mild cardiomegaly. There is dense mitral valve and coronary artery calcifications. Aortic atherosclerotic calcifications are seen. There is normal three-vessel brachiocephalic anatomy without proximal stenosis. Mediastinum/Nodes: No hilar, mediastinal, or axillary adenopathy. Thyroid gland, trachea, and esophagus demonstrate no significant findings. Lungs/Pleura: There is mildly hazy ground-glass opacities  seen throughout both lungs. Mild interstitial prominence seen predominantly within the upper lungs. No large airspace consolidation seen. No pleural effusions. Upper Abdomen: No acute abnormalities present in the visualized portions of the upper abdomen. Surgical clips within the gallbladder bed. Musculoskeletal: No chest wall abnormality. No acute or significant osseous findings. Stable slight anterior wedge compression deformity of the T6 vertebral body. Review of the MIP images confirms the above findings. CT ABDOMEN and PELVIS FINDINGS Lower chest: There is mild cardiomegaly. A small hiatal hernia is present. Hepatobiliary: The liver is normal in density without focal abnormality.The main portal vein is patent. The patient is status post cholecystectomy. No biliary ductal dilation. Pancreas: Unremarkable. No pancreatic ductal dilatation or surrounding inflammatory changes. Spleen: Normal in size without focal abnormality. Adrenals/Urinary Tract: Both adrenal glands appear normal. Cortical scarring seen within both kidneys. A small subcentimeter hypodense lesion seen within the upper pole the left kidney. No hydronephrosis. Stomach/Bowel: The stomach, small bowel, and colon are normal in appearance. Scattered colonic diverticula noted without diverticulitis. No inflammatory changes, wall thickening, or obstructive findings.The appendix is not well seen. Vascular/Lymphatic: There are no enlarged mesenteric, retroperitoneal, or pelvic lymph nodes. Scattered aortic atherosclerotic calcifications are seen without aneurysmal dilatation. Reproductive: The patient is status post hysterectomy. No adnexal masses or collections seen.  Again noted is a right adnexal ovarian cyst measuring 4 cm. Other: Along the right lower anterior abdominal wall there is is surgical wound with a small amount of subcutaneous emphysema. There is a new small fluid collection measuring 4.2 cm seen within this area. Subcutaneous fat stranding  changes are seen along the anterior abdominal wall, predominantly within the right lower quadrant. Musculoskeletal: No acute or significant osseous findings. Grade 1 anterolisthesis of L4 on L5 is seen. Review of the MIP images confirms the above findings. IMPRESSION: 1. No central, segmental, or subsegmental pulmonary embolism. 2. Mild cardiomegaly. 3. Dense mitral valve and coronary artery calcifications. 4. Findings suggestive of mild pulmonary edema. 5. Diverticula without diverticulitis 6. Right anterior abdominal wall surgical wound with a adjacent new small fluid collection measuring 4.2 cm in the subcutaneous tissues. This could represent a postoperative seroma, phlegmon, or early superficial abscess. Electronically Signed   By: Prudencio Pair M.D.   On: 04/22/2019 22:50   Ct Abdomen Pelvis W Contrast  Result Date: 04/22/2019 CLINICAL DATA:  Abdominal pain, acute shortness of breath EXAM: CT ANGIOGRAPHY CHEST CT ABDOMEN AND PELVIS WITH CONTRAST TECHNIQUE: Multidetector CT imaging of the chest was performed using the standard protocol during bolus administration of intravenous contrast. Multiplanar CT image reconstructions and MIPs were obtained to evaluate the vascular anatomy. Multidetector CT imaging of the abdomen and pelvis was performed using the standard protocol during bolus administration of intravenous contrast. CONTRAST:  128m OMNIPAQUE IOHEXOL 350 MG/ML SOLN COMPARISON:  CT abdomen pelvis March 07, 2019 FINDINGS: CTA CHEST FINDINGS Cardiovascular: There is a optimal opacification of the pulmonary arteries. There is no central,segmental, or subsegmental filling defects within the pulmonary arteries. There is mild cardiomegaly. There is dense mitral valve and coronary artery calcifications. Aortic atherosclerotic calcifications are seen. There is normal three-vessel brachiocephalic anatomy without proximal stenosis. Mediastinum/Nodes: No hilar, mediastinal, or axillary adenopathy. Thyroid gland,  trachea, and esophagus demonstrate no significant findings. Lungs/Pleura: There is mildly hazy ground-glass opacities seen throughout both lungs. Mild interstitial prominence seen predominantly within the upper lungs. No large airspace consolidation seen. No pleural effusions. Upper Abdomen: No acute abnormalities present in the visualized portions of the upper abdomen. Surgical clips within the gallbladder bed. Musculoskeletal: No chest wall abnormality. No acute or significant osseous findings. Stable slight anterior wedge compression deformity of the T6 vertebral body. Review of the MIP images confirms the above findings. CT ABDOMEN and PELVIS FINDINGS Lower chest: There is mild cardiomegaly. A small hiatal hernia is present. Hepatobiliary: The liver is normal in density without focal abnormality.The main portal vein is patent. The patient is status post cholecystectomy. No biliary ductal dilation. Pancreas: Unremarkable. No pancreatic ductal dilatation or surrounding inflammatory changes. Spleen: Normal in size without focal abnormality. Adrenals/Urinary Tract: Both adrenal glands appear normal. Cortical scarring seen within both kidneys. A small subcentimeter hypodense lesion seen within the upper pole the left kidney. No hydronephrosis. Stomach/Bowel: The stomach, small bowel, and colon are normal in appearance. Scattered colonic diverticula noted without diverticulitis. No inflammatory changes, wall thickening, or obstructive findings.The appendix is not well seen. Vascular/Lymphatic: There are no enlarged mesenteric, retroperitoneal, or pelvic lymph nodes. Scattered aortic atherosclerotic calcifications are seen without aneurysmal dilatation. Reproductive: The patient is status post hysterectomy. No adnexal masses or collections seen. Again noted is a right adnexal ovarian cyst measuring 4 cm. Other: Along the right lower anterior abdominal wall there is is surgical wound with a small amount of subcutaneous  emphysema. There is a new small fluid  collection measuring 4.2 cm seen within this area. Subcutaneous fat stranding changes are seen along the anterior abdominal wall, predominantly within the right lower quadrant. Musculoskeletal: No acute or significant osseous findings. Grade 1 anterolisthesis of L4 on L5 is seen. Review of the MIP images confirms the above findings. IMPRESSION: 1. No central, segmental, or subsegmental pulmonary embolism. 2. Mild cardiomegaly. 3. Dense mitral valve and coronary artery calcifications. 4. Findings suggestive of mild pulmonary edema. 5. Diverticula without diverticulitis 6. Right anterior abdominal wall surgical wound with a adjacent new small fluid collection measuring 4.2 cm in the subcutaneous tissues. This could represent a postoperative seroma, phlegmon, or early superficial abscess. Electronically Signed   By: Prudencio Pair M.D.   On: 04/22/2019 22:50   Scheduled Meds:  apixaban  5 mg Oral BID   buPROPion  300 mg Oral Daily   Chlorhexidine Gluconate Cloth  6 each Topical Daily   escitalopram  10 mg Oral Daily   ferrous sulfate  325 mg Oral BID WC   furosemide  40 mg Oral BID   insulin aspart  0-5 Units Subcutaneous QHS   insulin aspart  0-9 Units Subcutaneous TID WC   insulin detemir  15 Units Subcutaneous BID   metoprolol tartrate  75 mg Oral BID   pantoprazole  40 mg Oral QAC breakfast   sodium chloride flush  3 mL Intravenous Q12H   Continuous Infusions:  sodium chloride     sodium chloride       LOS: 1 day    Time spent: 35 minutes. Greater than 50% of this time was spent in direct contact with the patient, coordinating care and discussing relevant ongoing clinical issues, including ongoing hyperglycemia, atrial fibrillation with RVR and the need of Cardizem drip.  Still requiring low-dose of Cardizem drip requiring evaluation with demonstrated improvement in her rate control.  Blood sugar in the 220s.  No fever, no chest pain, no  nausea, no vomiting overall feeling better.  Having some discomfort in her abdomen on palpation close to her abdominal wound.     Barton Dubois, MD [facility] Pager [phone]   04/23/2019, 10:48 AM

## 2019-04-23 NOTE — H&P (Addendum)
TRH H&P    [name] Demographics:    [name], is a 68 y.o. female  MRN: [id number]  DOB - 09/23/1950  Admit Date - 04/22/2019  Referring MD/NP/PA: Caryl Ada  Outpatient Primary MD for the [name] is [name], MD [facility] - cardiology Sydnee Levans - dermatology  [name] coming from:  home  Chief complaint-  Hyperglycemia, Afib w RVR   HPI:    [name]  is a 68 y.o. female,  w hyeprtension, Dm2, CKD stage3, diastolic CHF , Pafib, Copd, Depression, w recent admission for chronic abdominal wound 03/07/2019 w new diagnosis of pyoderma gangrenosum on chronic prednisone therapy apparently had bs >500 earlier today and therefore came to ER. [name] was noted on arrival to have Afib with RVR.    [name] notes that she didn't take her metoprolol this am due to nausea.  [name] has not been able to eat well over the past few days.   [name] denies fever, chills, cough, cp, palp, sob, emesis, abd pain, diarrhea, brbpr, dysuria.   In ED,  Temperature 97.8 pulse 198 respiratory rate 20 blood pressure 120/110 pulse ox 96% on room air weight 89.4 kg  CTA chest IMPRESSION: 1. No central, segmental, or subsegmental pulmonary embolism. 2. Mild cardiomegaly. 3. Dense mitral valve and coronary artery calcifications. 4. Findings suggestive of mild pulmonary edema. 5. Diverticula without diverticulitis 6. Right anterior abdominal wall surgical wound with a adjacent new small fluid collection measuring 4.2 cm in the subcutaneous tissues. This could represent a postoperative seroma, phlegmon, or early superficial abscess.  Wbc 13.5, Hgb 9.5, Plt 635 Na 133, K 4.5 Bun 22, Creatinine 1.08 Hco3 19 AG 13  Ast 10, Alt 10, Alk phos 131, T. Bili 0.6 Alb 2.4 Lactic acid 2.3 -> 1.5  Ekg Afib at 174, nl axis  [name] started on cardizem GTT, and given dilaudid , and zofran 62m iv x2,  and NS 1L iv x1 in the ER  [name] will be admitted for hyperglycemia secondary to prednisone and Afib with RVR           Review of systems:    In addition to the HPI above,  No Fever-chills, No Headache, No changes with Vision or hearing, No problems swallowing food or Liquids, No Chest pain, Cough or Shortness of Breath, No Abdominal pain, No Vomiting, bowel movements are regular, No Blood in stool or Urine, No dysuria, No new skin rashes or bruises, No new joints pains-aches,  No new weakness, tingling, numbness in any extremity, No recent weight gain or loss, No polyuria, polydypsia or polyphagia, No significant Mental Stressors.  All other systems reviewed and are negative.    Past History of the following :    Past Medical History:  Diagnosis Date   [place]rial fibrillation [place]    CHF ([place]estive heart failure) ([place]    Depression    Diabetes mellitus without complication ([place]    History of transesophageal echocardiography (TEE) 03/2018   LV thrombus   Hypertension    Morbid  obesity ([place])    Osteoporosis    Urinary incontinence    UTI (lower urinary tract infection) 01/2016   Cipro for Klebsiella pneumoniae isolate   Wheelchair bound    bedbound      Past Surgical History:  Procedure Laterality Date   ABDOMINAL HYSTERECTOMY     APPLICATION OF WOUND VAC  03/09/2019   Procedure: APPLICATION OF WOUND VAC;  Surgeon: Virl Cagey, MD;  Location: [facility];  Service: General;;   BIOPSY  09/06/2018   Procedure: BIOPSY;  Surgeon: Danie Binder, MD;  Location[facility];  Service: Endoscopy;;  gastric bx's   CESAREAN SECTION     CHOLECYSTECTOMY     ESOPHAGEAL DILATION  09/06/2018   Procedure: ESOPHAGEAL DILATION;  Surgeon: Danie Binder, MD;  [facility];  Service: Endoscopy;;   ESOPHAGOGASTRODUODENOSCOPY (EGD) WITH PROPOFOL N/A 09/06/2018   Procedure: ESOPHAGOGASTRODUODENOSCOPY (EGD) WITH PROPOFOL;  Surgeon: Danie Binder,  MD;  [facility];  Service: Endoscopy;  Laterality: N/A;  dilatation   FEMUR IM NAIL Left 02/20/2016   FEMUR IM NAIL Left 02/20/2016   Procedure: INTRAMEDULLARY (IM) RETROGRADE FEMORAL NAILING;  Surgeon: Leandrew Koyanagi, MD;  Location: [facility];  Service: Orthopedics;  Laterality: Left;   PANCREAS SURGERY  1967   1 cyst excised and one cyst drained   TEE WITHOUT CARDIOVERSION N/A 04/05/2018   Procedure: TRANSESOPHAGEAL ECHOCARDIOGRAM (TEE);  Surgeon: Dorothy Spark, MD;  Location[facility];  Service: Cardiovascular;  Laterality: N/A;   VEIN LIGATION AND STRIPPING     WOUND DEBRIDEMENT N/A 03/09/2019   Procedure: EXCISIONAL DEBRIDEMENT OF ABDOMINAL WOUND ULCERS;  Surgeon: Virl Cagey, MD;  Location: [facility];  Service: General;  Laterality: N/A;   WRIST FRACTURE SURGERY        Social History:      Social History   Tobacco Use   Smoking status: Never Smoker   Smokeless tobacco: Never Used  Substance Use Topics   Alcohol use: No       Family History :     Family History  Problem Relation Age of Onset   Diabetes Mother        died in her 72's of a stroke   Cancer Mother    Stroke Mother    Breast cancer Mother    Diabetes Father        died in his 75's of a stroke.   Stroke Father    Diabetes Brother        died @ 66 of a stroke.   Stroke Brother    Diabetes Maternal Grandmother    Diabetes Maternal Grandfather    Diabetes Paternal Grandmother    Diabetes Paternal Grandfather    Breast cancer Maternal Aunt        Home Medications:   Prior to Admission medications   Medication Sig Start Date End Date Taking? Authorizing Provider  acetaminophen (TYLENOL) 500 MG tablet Take 500-1,000 mg by mouth every 8 (eight) hours as needed for mild pain or headache.    Yes [provider]  albuterol (PROVENTIL HFA;VENTOLIN HFA) 108 (90 Base) MCG/ACT inhaler Inhale 2 puffs into the lungs every 6 (six) hours as needed for wheezing or  shortness of breath. [name] taking differently: Inhale 2 puffs into the lungs every 6 (six) hours as needed for wheezing or shortness of breath (As needed).  08/14/18  Yes Kathie Dike, MD  apixaban (ELIQUIS) 5 MG TABS tablet Take 1 tablet (5  mg total) by mouth 2 (two) times daily. 07/21/18  Yes Herminio Commons, MD  buPROPion (WELLBUTRIN XL) 300 MG 24 hr tablet Take 300 mg by mouth daily.  01/31/19  Yes [provider]  diltiazem (CARDIZEM CD) 120 MG 24 hr capsule Take 1 capsule (120 mg total) by mouth daily. [name] taking differently: Take 120 mg by mouth daily. Cartia XT 09/10/18  Yes Johnson, Clanford L, MD  escitalopram (LEXAPRO) 10 MG tablet Take 10 mg by mouth daily. 12/28/18  Yes [provider]  ferrous sulfate (FERROUSUL) 325 (65 FE) MG tablet Take 1 tablet (325 mg total) by mouth 2 (two) times daily with a meal. 01/16/19  Yes Domenic Polite, MD  fluticasone [facility]) 50 MCG/ACT nasal spray Place 2 sprays into both nostrils daily as needed for allergies or rhinitis (congestion).    Yes [provider]  furosemide (LASIX) 20 MG tablet Take 2 tablets (40 mg total) by mouth 2 (two) times daily. 03/11/19 04/22/19 Yes Johnson, Clanford L, MD  glipiZIDE (GLUCOTROL) 10 MG tablet Take 10 mg by mouth 2 (two) times daily. 03/02/18  Yes [provider]  levalbuterol Penne Lash) 0.63 MG/3ML nebulizer solution Take 0.63 mg by nebulization every 6 (six) hours as needed for wheezing or shortness of breath.  02/08/19  Yes [provider]  LEVEMIR FLEXTOUCH 100 UNIT/ML Pen Inject 10 Units into the skin 2 (two) times a day. 01/05/19  Yes [provider]  metoprolol tartrate (LOPRESSOR) 50 MG tablet Take 50 mg by mouth 2 (two) times daily.  02/12/19  Yes [provider]  nystatin (MYCOSTATIN/NYSTOP) powder Apply topically daily as needed (rash in skin folds).   Yes [provider]  OVER THE COUNTER MEDICATION Apply 1 application topically  daily as needed (rash in skin folds). Baby butt paste   Yes [provider]  pantoprazole (PROTONIX) 40 MG tablet Take 1 tablet (40 mg total) by mouth daily before breakfast. [name] taking differently: Take 40 mg by mouth daily as needed (for GERD/acid reflux).  09/11/18  Yes Johnson, Clanford L, MD  collagenase (SANTYL) ointment Apply 1 application topically 2 (two) times daily. Twice daily applications to the abdominal wound with damp to dry saline dressings [name] not taking: Reported on 04/22/2019 03/30/19   Virl Cagey, MD  oxyCODONE (OXY IR/ROXICODONE) 5 MG immediate release tablet Take 1 tablet (5 mg total) by mouth every 6 (six) hours as needed for severe pain (wound vacuum changes). [name] not taking: Reported on 04/22/2019 04/06/19   Virl Cagey, MD     Allergies:     Allergies  Allergen Reactions   Feraheme [Ferumoxytol] Other (See Comments)    Back pain (yelling out with back pain)   Propranolol Swelling    [name] states it may have been leg swelling   Topamax [Topiramate] Other (See Comments)    hallucinations   Latex Itching and Rash     Physical Exam:   Vitals  Blood pressure (!) 122/109, pulse (!) 149, temperature 97.8 F (36.6 C), temperature source Oral, resp. rate 20, height 5' 4"  (1.626 m), weight 89.4 kg, SpO2 97 %.  1.  General: axoxo3  2. Psychiatric: euthymic  3. Neurologic: cn2-12 intact, reflexes 2+ symmetric, diffuse with no clonus, motor 5/5 in all 4 ext  4. HEENMT:  Cushingoid, Aniciteric, pupils 1.55m symmetric, direct, consensual , intact Neck: no jvd  5. Respiratory : CTAB  6. Cardiovascular : Irr, irr, s1, s2,  1/6 sem llsb  7. Gastrointestinal:  ABd: soft, obese, nt, nd, +bs There is wound vac in the right lower abdomen  8. Skin:  Ext: no c/c/e,  onychomycosis  9.Musculoskeletal:  Good ROM    Data Review:    CBC Recent Labs  Lab 04/22/19 1847  WBC 13.5*  HGB 9.5*  HCT 31.9*  PLT 635*  MCV 87.9    MCH 26.2  MCHC 29.8*  RDW 15.1   ------------------------------------------------------------------------------------------------------------------  Results for orders placed or performed during the hospital encounter of 04/22/19 (from the past 48 hour(s))  CBG monitoring, ED     Status: Abnormal   Collection Time: 04/22/19  5:52 PM  Result Value Ref Range   Glucose-Capillary 318 (H) 70 - 99 mg/dL  Basic metabolic panel     Status: Abnormal   Collection Time: 04/22/19  6:47 PM  Result Value Ref Range   Sodium 133 (L) 135 - 145 mmol/L   Potassium 4.5 3.5 - 5.1 mmol/L   Chloride 101 98 - 111 mmol/L   CO2 19 (L) 22 - 32 mmol/L   Glucose, Bld 348 (H) 70 - 99 mg/dL   BUN 22 8 - 23 mg/dL   Creatinine, Ser 1.08 (H) 0.44 - 1.00 mg/dL   Calcium 7.9 (L) 8.9 - 10.3 mg/dL   GFR calc non Af Amer 53 (L) >60 mL/min   GFR calc Af Amer >60 >60 mL/min   Anion gap 13 5 - 15    Comment: Performed at [facility], [address] [place], [place] [address]  CBC     Status: Abnormal   Collection Time: 04/22/19  6:47 PM  Result Value Ref Range   WBC 13.5 (H) 4.0 - 10.5 K/uL   RBC 3.63 (L) 3.87 - 5.11 MIL/uL   Hemoglobin 9.5 (L) 12.0 - 15.0 g/dL   HCT 31.9 (L) 36.0 - 46.0 %   MCV 87.9 80.0 - 100.0 fL   MCH 26.2 26.0 - 34.0 pg   MCHC 29.8 (L) 30.0 - 36.0 g/dL   RDW 15.1 11.5 - 15.5 %   Platelets 635 (H) 150 - 400 K/uL   nRBC 0.8 (H) 0.0 - 0.2 %    Comment: Performed at [facility], [address], [place], [place] [address]  Lactic acid, plasma     Status: Abnormal   Collection Time: 04/22/19  6:47 PM  Result Value Ref Range   Lactic Acid, Venous 2.3 (HH) 0.5 - 1.9 mmol/L    Comment: CRITICAL RESULT CALLED TO, READ BACK BY AND VERIFIED WITH: WALKER,T @ 1941 ON 04/22/19 BY JUW Performed at [facility], [address] [place], [place] [address]   Hepatic function panel     Status: Abnormal   Collection Time: 04/22/19  6:47 PM  Result Value Ref Range   Total Protein 5.9 (L) 6.5 - 8.1 g/dL    Albumin 2.4 (L) 3.5 - 5.0 g/dL   AST 10 (L) 15 - 41 U/L   ALT 10 0 - 44 U/L   Alkaline Phosphatase 131 (H) 38 - 126 U/L   Total Bilirubin 0.6 0.3 - 1.2 mg/dL   Bilirubin, Direct 0.2 0.0 - 0.2 mg/dL   Indirect Bilirubin 0.4 0.3 - 0.9 mg/dL    Comment: Performed at [facility], [address], [place], [place] [address]  Lactic acid, plasma     Status: None   Collection Time: 04/22/19  8:55 PM  Result Value Ref Range   Lactic Acid, Venous 1.5 0.5 - 1.9 mmol/L    Comment: Performed  at [facility], [address] [place], [place] [address]    Chemistries  Recent Labs  Lab 04/22/19 1847  NA 133*  K 4.5  CL 101  CO2 19*  GLUCOSE 348*  BUN 22  CREATININE 1.08*  CALCIUM 7.9*  AST 10*  ALT 10  ALKPHOS 131*  BILITOT 0.6   ------------------------------------------------------------------------------------------------------------------  ------------------------------------------------------------------------------------------------------------------ GFR: Estimated Creatinine Clearance: 54 mL/min (A) (by C-G formula based on SCr of 1.08 mg/dL (H)). Liver Function Tests: Recent Labs  Lab 04/22/19 1847  AST 10*  ALT 10  ALKPHOS 131*  BILITOT 0.6  PROT 5.9*  ALBUMIN 2.4*   No results for input(s): LIPASE, AMYLASE in the last 168 hours. No results for input(s): AMMONIA in the last 168 hours. Coagulation Profile: No results for input(s): INR, PROTIME in the last 168 hours. Cardiac Enzymes: No results for input(s): CKTOTAL, CKMB, CKMBINDEX, TROPONINI in the last 168 hours. BNP (last 3 results) No results for input(s): PROBNP in the last 8760 hours. HbA1C: No results for input(s): HGBA1C in the last 72 hours. CBG: Recent Labs  Lab 04/22/19 1752  GLUCAP 318*   Lipid Profile: No results for input(s): CHOL, HDL, LDLCALC, TRIG, CHOLHDL, LDLDIRECT in the last 72 hours. Thyroid Function Tests: No results for input(s): TSH, T4TOTAL, FREET4, T3FREE, THYROIDAB in the last 72  hours. Anemia Panel: No results for input(s): VITAMINB12, FOLATE, FERRITIN, TIBC, IRON, RETICCTPCT in the last 72 hours.  --------------------------------------------------------------------------------------------------------------- Urine analysis:    Component Value Date/Time   COLORURINE YELLOW 03/07/2019 2104   APPEARANCEUR HAZY (A) 03/07/2019 2104   LABSPEC 1.024 03/07/2019 2104   PHURINE 6.0 03/07/2019 2104   GLUCOSEU NEGATIVE 03/07/2019 2104   HGBUR NEGATIVE 03/07/2019 2104   BILIRUBINUR NEGATIVE 03/07/2019 2104   KETONESUR NEGATIVE 03/07/2019 2104   PROTEINUR 100 (A) 03/07/2019 2104   UROBILINOGEN 0.2 12/01/2007 1025   NITRITE NEGATIVE 03/07/2019 2104   LEUKOCYTESUR MODERATE (A) 03/07/2019 2104      Imaging Results:    Ct Angio Chest Pe W And/or Wo Contrast  Result Date: 04/22/2019 CLINICAL DATA:  Abdominal pain, acute shortness of breath EXAM: CT ANGIOGRAPHY CHEST CT ABDOMEN AND PELVIS WITH CONTRAST TECHNIQUE: Multidetector CT imaging of the chest was performed using the standard protocol during bolus administration of intravenous contrast. Multiplanar CT image reconstructions and MIPs were obtained to evaluate the vascular anatomy. Multidetector CT imaging of the abdomen and pelvis was performed using the standard protocol during bolus administration of intravenous contrast. CONTRAST:  160m OMNIPAQUE IOHEXOL 350 MG/ML SOLN COMPARISON:  CT abdomen pelvis March 07, 2019 FINDINGS: CTA CHEST FINDINGS Cardiovascular: There is a optimal opacification of the pulmonary arteries. There is no central,segmental, or subsegmental filling defects within the pulmonary arteries. There is mild cardiomegaly. There is dense mitral valve and coronary artery calcifications. Aortic atherosclerotic calcifications are seen. There is normal three-vessel brachiocephalic anatomy without proximal stenosis. Mediastinum/Nodes: No hilar, mediastinal, or axillary adenopathy. Thyroid gland, trachea, and esophagus  demonstrate no significant findings. Lungs/Pleura: There is mildly hazy ground-glass opacities seen throughout both lungs. Mild interstitial prominence seen predominantly within the upper lungs. No large airspace consolidation seen. No pleural effusions. Upper Abdomen: No acute abnormalities present in the visualized portions of the upper abdomen. Surgical clips within the gallbladder bed. Musculoskeletal: No chest wall abnormality. No acute or significant osseous findings. Stable slight anterior wedge compression deformity of the T6 vertebral body. Review of the MIP images confirms the above findings. CT ABDOMEN and PELVIS FINDINGS Lower chest: There is mild cardiomegaly.  A small hiatal hernia is present. Hepatobiliary: The liver is normal in density without focal abnormality.The main portal vein is patent. The [name] is status post cholecystectomy. No biliary ductal dilation. Pancreas: Unremarkable. No pancreatic ductal dilatation or surrounding inflammatory changes. Spleen: Normal in size without focal abnormality. Adrenals/Urinary Tract: Both adrenal glands appear normal. Cortical scarring seen within both kidneys. A small subcentimeter hypodense lesion seen within the upper pole the left kidney. No hydronephrosis. Stomach/Bowel: The stomach, small bowel, and colon are normal in appearance. Scattered colonic diverticula noted without diverticulitis. No inflammatory changes, wall thickening, or obstructive findings.The appendix is not well seen. Vascular/Lymphatic: There are no enlarged mesenteric, retroperitoneal, or pelvic lymph nodes. Scattered aortic atherosclerotic calcifications are seen without aneurysmal dilatation. Reproductive: The [name] is status post hysterectomy. No adnexal masses or collections seen. Again noted is a right adnexal ovarian cyst measuring 4 cm. Other: Along the right lower anterior abdominal wall there is is surgical wound with a small amount of subcutaneous emphysema. There is a  new small fluid collection measuring 4.2 cm seen within this area. Subcutaneous fat stranding changes are seen along the anterior abdominal wall, predominantly within the right lower quadrant. Musculoskeletal: No acute or significant osseous findings. Grade 1 anterolisthesis of L4 on L5 is seen. Review of the MIP images confirms the above findings. IMPRESSION: 1. No central, segmental, or subsegmental pulmonary embolism. 2. Mild cardiomegaly. 3. Dense mitral valve and coronary artery calcifications. 4. Findings suggestive of mild pulmonary edema. 5. Diverticula without diverticulitis 6. Right anterior abdominal wall surgical wound with a adjacent new small fluid collection measuring 4.2 cm in the subcutaneous tissues. This could represent a postoperative seroma, phlegmon, or early superficial abscess. Electronically Signed   By: Prudencio Pair M.D.   On: 04/22/2019 22:50   Ct Abdomen Pelvis W Contrast  Result Date: 04/22/2019 CLINICAL DATA:  Abdominal pain, acute shortness of breath EXAM: CT ANGIOGRAPHY CHEST CT ABDOMEN AND PELVIS WITH CONTRAST TECHNIQUE: Multidetector CT imaging of the chest was performed using the standard protocol during bolus administration of intravenous contrast. Multiplanar CT image reconstructions and MIPs were obtained to evaluate the vascular anatomy. Multidetector CT imaging of the abdomen and pelvis was performed using the standard protocol during bolus administration of intravenous contrast. CONTRAST:  175m OMNIPAQUE IOHEXOL 350 MG/ML SOLN COMPARISON:  CT abdomen pelvis March 07, 2019 FINDINGS: CTA CHEST FINDINGS Cardiovascular: There is a optimal opacification of the pulmonary arteries. There is no central,segmental, or subsegmental filling defects within the pulmonary arteries. There is mild cardiomegaly. There is dense mitral valve and coronary artery calcifications. Aortic atherosclerotic calcifications are seen. There is normal three-vessel brachiocephalic anatomy without proximal  stenosis. Mediastinum/Nodes: No hilar, mediastinal, or axillary adenopathy. Thyroid gland, trachea, and esophagus demonstrate no significant findings. Lungs/Pleura: There is mildly hazy ground-glass opacities seen throughout both lungs. Mild interstitial prominence seen predominantly within the upper lungs. No large airspace consolidation seen. No pleural effusions. Upper Abdomen: No acute abnormalities present in the visualized portions of the upper abdomen. Surgical clips within the gallbladder bed. Musculoskeletal: No chest wall abnormality. No acute or significant osseous findings. Stable slight anterior wedge compression deformity of the T6 vertebral body. Review of the MIP images confirms the above findings. CT ABDOMEN and PELVIS FINDINGS Lower chest: There is mild cardiomegaly. A small hiatal hernia is present. Hepatobiliary: The liver is normal in density without focal abnormality.The main portal vein is patent. The [name] is status post cholecystectomy. No biliary ductal dilation. Pancreas: Unremarkable. No pancreatic ductal dilatation or  surrounding inflammatory changes. Spleen: Normal in size without focal abnormality. Adrenals/Urinary Tract: Both adrenal glands appear normal. Cortical scarring seen within both kidneys. A small subcentimeter hypodense lesion seen within the upper pole the left kidney. No hydronephrosis. Stomach/Bowel: The stomach, small bowel, and colon are normal in appearance. Scattered colonic diverticula noted without diverticulitis. No inflammatory changes, wall thickening, or obstructive findings.The appendix is not well seen. Vascular/Lymphatic: There are no enlarged mesenteric, retroperitoneal, or pelvic lymph nodes. Scattered aortic atherosclerotic calcifications are seen without aneurysmal dilatation. Reproductive: The [name] is status post hysterectomy. No adnexal masses or collections seen. Again noted is a right adnexal ovarian cyst measuring 4 cm. Other: Along the right  lower anterior abdominal wall there is is surgical wound with a small amount of subcutaneous emphysema. There is a new small fluid collection measuring 4.2 cm seen within this area. Subcutaneous fat stranding changes are seen along the anterior abdominal wall, predominantly within the right lower quadrant. Musculoskeletal: No acute or significant osseous findings. Grade 1 anterolisthesis of L4 on L5 is seen. Review of the MIP images confirms the above findings. IMPRESSION: 1. No central, segmental, or subsegmental pulmonary embolism. 2. Mild cardiomegaly. 3. Dense mitral valve and coronary artery calcifications. 4. Findings suggestive of mild pulmonary edema. 5. Diverticula without diverticulitis 6. Right anterior abdominal wall surgical wound with a adjacent new small fluid collection measuring 4.2 cm in the subcutaneous tissues. This could represent a postoperative seroma, phlegmon, or early superficial abscess. Electronically Signed   By: Prudencio Pair M.D.   On: 04/22/2019 22:50       Assessment & Plan:    Principal Problem:   Atrial fibrillation with RVR (HCC) Active Problems:   HTN (hypertension)   Depression   CKD (chronic kidney disease), stage III (HCC)   Diabetes (HCC)  Afib with RVR Tele Trop I q3h x2 Check tsh Check cardiac echo  cardizem gtt Cont Metoprolol 42m po bid Cont Eliquis pharmacy to dose  H/o CHF (diastolic) Cont Metoprolol as above Cont Lasix 282mpo qday  Hyperglycemia w Dm2, CKD stage3 Cont Levemir 10units Glasgow bid Hold off on Glucotrol fsbs ac and qhs, ISS Check cmp in am  Nausea Zofran 18m57mv q6h prn   Pyoderma Gangrenosum [name] stopped her prednisone, Will defer on starting abx since [name] afebrile and exam the other day benign by surgery Consult to general surgery placed in computer for evaluation of abdominal wound due to findings of new small fluid collection 4.2cm Check cbc in am  Anemia Cont ferrous sulfate Check cbc in am  Depression  Cont  Lexapro 10m80m qday Cont Wellbutrin XL 300mg47mqday  Gerd Cont Protonix   DVT Prophylaxis-   Eliquis   AM Labs Ordered, also please review Full Orders  Family Communication: Admission, patients condition and plan of care including tests being ordered have been discussed with the [name]  who indicate understanding and agree with the plan and Code Status.  Code Status:  FULL CODE per [name], spoke with Spouse John Jerlyn Lynotified him of admission   Admission status: Inpatient: Based on patients clinical presentation and evaluation of above clinical data, I have made determination that [name] meets Inpatient criteria at this time.  [name] has Afib with RVR, requiring cardizem GTT,  [name] is critically ill, and will require > 2 nites stay to get her Afib under rate control as well as getting her sugar >500 to a more reasonable level   Time spent in minutes : 60  minutes critical care   Jani [place] M.D on 04/23/2019 at 12:16 AM

## 2019-04-23 NOTE — Progress Notes (Addendum)
BG 1200 was 68 lunch tray, soda, and popsicle given: I did not recheck until 4:23pm and BG 26 pt was clammy,  HA, loss of appetite gave D50 and OJ and recheck was 145 at  1645. Performed recheck at 1830 BG 53.Gave 4oz reg soda and will recheck 7824 was 51. Gave PO glucose and another popsicle.  Notified Dr. Dyann Kief. Awaiting response. Will continue to monitor.  Dr. Dyann Kief ordered D50 1amp. Discontinue Levemir HS. May start D50 infusion if BG does not stabilize.

## 2019-04-24 DIAGNOSIS — S31109S Unspecified open wound of abdominal wall, unspecified quadrant without penetration into peritoneal cavity, sequela: Secondary | ICD-10-CM

## 2019-04-24 LAB — CBC
HCT: 28.7 % — ABNORMAL LOW (ref 36.0–46.0)
Hemoglobin: 8.5 g/dL — ABNORMAL LOW (ref 12.0–15.0)
MCH: 26.4 pg (ref 26.0–34.0)
MCHC: 29.6 g/dL — ABNORMAL LOW (ref 30.0–36.0)
MCV: 89.1 fL (ref 80.0–100.0)
Platelets: 552 10*3/uL — ABNORMAL HIGH (ref 150–400)
RBC: 3.22 MIL/uL — ABNORMAL LOW (ref 3.87–5.11)
RDW: 15 % (ref 11.5–15.5)
WBC: 15.2 10*3/uL — ABNORMAL HIGH (ref 4.0–10.5)
nRBC: 0.5 % — ABNORMAL HIGH (ref 0.0–0.2)

## 2019-04-24 LAB — BASIC METABOLIC PANEL
Anion gap: 7 (ref 5–15)
BUN: 21 mg/dL (ref 8–23)
CO2: 23 mmol/L (ref 22–32)
Calcium: 7.4 mg/dL — ABNORMAL LOW (ref 8.9–10.3)
Chloride: 101 mmol/L (ref 98–111)
Creatinine, Ser: 0.93 mg/dL (ref 0.44–1.00)
GFR calc Af Amer: >60 mL/min (ref >60)
GFR calc non Af Amer: >60 mL/min (ref >60)
Glucose, Bld: 96 mg/dL (ref 70–99)
Potassium: 3.5 mmol/L (ref 3.5–5.1)
Sodium: 131 mmol/L — ABNORMAL LOW (ref 135–145)

## 2019-04-24 LAB — GLUCOSE, CAPILLARY
Glucose-Capillary: 58 mg/dL — ABNORMAL LOW (ref 70–99)
Glucose-Capillary: 114 mg/dL — ABNORMAL HIGH (ref 70–99)
Glucose-Capillary: 57 mg/dL — ABNORMAL LOW (ref 70–99)
Glucose-Capillary: 73 mg/dL (ref 70–99)
Glucose-Capillary: 200 mg/dL — ABNORMAL HIGH (ref 70–99)
Glucose-Capillary: 297 mg/dL — ABNORMAL HIGH (ref 70–99)
Glucose-Capillary: 160 mg/dL — ABNORMAL HIGH (ref 70–99)

## 2019-04-24 LAB — HIV ANTIBODY (ROUTINE TESTING W REFLEX): HIV Screen 4th Generation wRfx: NONREACTIVE

## 2019-04-24 NOTE — TOC Initial Note (Signed)
Transition of Care [facility]) - Initial/Assessment Note    Patient Details  Name: [name] MRN: [id number] Date of Birth: 09/14/1950  Transition of [facility] CM/SW Contact:    Katrina Gully, LCSW Phone Number: 04/24/2019, 11:51 AM  Clinical Narrative:                 Patient known to [facility] from previous admissions. Patient is from home with spouse. Patient admitted with Atrial fibrillation with RVR. Patient remains active with [place] per spouse. Patient is non ambulatory and uses a wheelchair.  She has no issues maintaining physician appointments nor obtaining medications. TOC to follow through discharge and address needs as they arise.   Expected Discharge Plan: [place] Barriers to Discharge: Continued Medical Work up   Patient Goals and CMS Choice Patient states their goals for this hospitalization and ongoing recovery are:: [name]      Expected Discharge Plan and Services Expected Discharge Plan: [place] arrangements for the past 2 months: Single Family Home                                      Prior Living Arrangements/Services Living arrangements for the past 2 months: Single Family Home Lives with:: Spouse, Other (Comment)(dependent granddaughter) Patient language and need for interpreter reviewed:: Yes Do you feel safe going back to the place where you live?: Yes      Need for Family Participation in Patient Care: Yes (Comment) Care giver support system in place?: Yes (comment) Current home services: DME, Home RN Criminal Activity/Legal Involvement Pertinent to Current Situation/Hospitalization: No - Comment as needed  Activities of Daily Living Home Assistive Devices/Equipment: CBG Meter, Eyeglasses, Wheelchair, Shower chair with back, Scales, Blood pressure cuff ADL Screening (condition at time of admission) Patient's cognitive ability adequate to safely  complete daily activities?: Yes Is the patient deaf or have difficulty hearing?: No Does the patient have difficulty seeing, even when wearing glasses/contacts?: No Does the patient have difficulty concentrating, remembering, or making decisions?: No Patient able to express need for assistance with ADLs?: Yes Does the patient have difficulty dressing or bathing?: No Independently performs ADLs?: Yes (appropriate for developmental age) Does the patient have difficulty walking or climbing stairs?: Yes Weakness of Legs: Both Weakness of Arms/Hands: Both  Permission Sought/Granted Permission sought to share information with : Family Supports          Permission granted to share info w Relationship: Spouse, [name].     Emotional Assessment Appearance:: Appears stated age   Affect (typically observed): Unable to Assess Orientation: : Oriented to Self, Oriented to Place, Oriented to  Time, Oriented to Situation Alcohol / Substance Use: Not Applicable Psych Involvement: No (comment)  Admission diagnosis:  Hyperglycemia [R73.9] Atrial fibrillation with RVR ([place]) [I48.91] Patient Active Problem List   Diagnosis Date Noted  . Diabetes ([place]) 04/22/2019  . Pyoderma gangrenosum 03/11/2019  . Wound infection 03/09/2019  . Chronic wound infection of abdomen   . Wheelchair bound   . Abdominal wall skin ulcer ([place]) 03/07/2019  . Acute pulmonary edema ([place]) 02/17/2019  . Iron deficiency anemia 02/01/2019  . Abdominal wall ulcer, with fat layer exposed ([place]) 01/16/2019  . Elevated troponin   . Nausea and vomiting   . Symptomatic bradycardia 01/10/2019  .  DKA (diabetic ketoacidoses) ([place]) 01/10/2019  . Urinary incontinence 12/28/2018  . Recurrent urinary tract infection 12/16/2018  . Wound, open, anterior abdominal wall 12/05/2018  . Hypotension due to hypovolemia   . Gastritis due to nonsteroidal anti-inflammatory drug   . Esophageal dysphagia   . GI bleed 09/03/2018  . Coagulopathy  ([place])   . Colitis   . Lower abdominal pain   . Rectal bleeding   . Dehydration 08/16/2018  . Acute on chronic diastolic CHF (congestive heart failure) ([place]) 08/10/2018  . Acute lower UTI 08/10/2018  . Hypokalemia 08/10/2018  . COPD (chronic obstructive pulmonary disease) ([place]) 08/10/2018  . Thyroid lesion 08/10/2018  . Closed fracture of right ankle 06/10/2018  . Closed fracture of left tibia and fibula with routine healing 04/13/18 06/10/2018  . Atrial fibrillation ([place]) 04/15/2018  . Chronic diastolic CHF (congestive heart failure) ([place]) 04/15/2018  . CKD (chronic kidney disease), stage III ([place]) 04/15/2018  . Closed right fibular fracture 04/15/2018  . Left tibial fracture 04/14/2018  . CHF exacerbation ([place]) 04/10/2018  . SOB (shortness of breath)   . Acute CHF (congestive heart failure) ([place]) 04/01/2018  . Atrial fibrillation with RVR ([place]) 04/01/2018  . [place] 03/11/2016  . Muscle spasms of lower extremity 03/04/2016  . Acute renal insufficiency 02/26/2016  . History of MRSA infection 02/26/2016  . HTN (hypertension) 02/19/2016  . Fracture, femur, distal ([place]) 02/19/2016  . Fall 02/19/2016  . Depression 02/19/2016  . Closed left femoral fracture, initial encounter 02/19/2016  . Femur fracture, left ([place]) 02/19/2016   PCP:  [name], MD Pharmacy:   [facility] [address] [name] [address] [place] [address] Phone: [phone] Fax: [phone]  [id number] [address] [place]: [phone]: [phone]    Social Determinants of Health (SDOH) Interventions    Readmission Risk Interventions Readmission Risk Prevention Plan 03/11/2019 03/10/2019 02/19/2019  Transportation Screening - Complete Complete  Medication Review (Press photographer Complete - Complete  PCP or Specialist appointment within 3-5 days of discharge  Complete - Not Complete  [place] or [place]- Complete Complete  SW Recovery Care/Counseling Consult - Complete Patient refused  Palliative Care Screening - Not Applicable [place]- Not Applicable Not Applicable  Some recent data might be hidden

## 2019-04-24 NOTE — Progress Notes (Signed)
PROGRESS NOTE    [name]  KZS:[id number] DOB: Oct 21, 1950 DOA: 04/22/2019 PCP: [name], MD     Brief Narrative:  68 y.o. female,  w hyeprtension, Dm2, CKD stage3, diastolic CHF , Pafib, Copd, Depression, w recent admission for chronic abdominal wound 03/07/2019 w new diagnosis of pyoderma gangrenosum on chronic prednisone therapy apparently had bs >500 earlier today and therefore came to ER. Pt was noted on arrival to have Afib with RVR.    Pt notes that she didn't take her metoprolol this am due to nausea.  Pt has not been able to eat well over the past few days.   Pt denies fever, chills, cough, cp, palp, sob, emesis, abd pain, diarrhea, brbpr, dysuria.   In ED,  Temperature 97.8 pulse 198 respiratory rate 20 blood pressure 120/110 pulse ox 96% on room air weight 89.4 kg   Assessment & Plan: 1-atrial fibrillation with RVR (HCC) -Most likely trigger from dehydration in the setting of hyperglycemia and small skin abscess.  -continue metoprolol at current dose -off cardizem drip -continue monitoring on telemetry  -advised to maintain adequate hydraiton  2-type 2 diabetes mellitus with hyperglycemia on presentation. -Now experiencing hypoglycemic events -Check cortisol level -Discontinue Levemir -Continue sliding scale insulin -Patient has been started on IV D5 infusion and will encourage not to skip meals and to increase oral intake.  -A1C 9.9  3-HTN (hypertension) -Soft vitals, but stable -Continue monitoring vital signs. -continue IVF's.   4-Depression -Stable mood -No suicidal ideation or hallucination -Continue Lexapro and Wellbutrin  5-GERD -continue PPI  6-chronic kidney disease is stage III -Appears to be stable at baseline -Continue monitoring electrolytes and renal function trend.  7-pyoderma gangrenosum/small superficial abscess. -Follow general surgery recommendation -There was a small fluid collection 4.2 cm, s/p drainage by general  surgery at bedside. -WBC's elevated, no fever -started on augmentin. -continue wound care.    DVT prophylaxis: Chronically on Eliquis. Code Status: Full code Family Communication: No family bedside Disposition Plan: Now that her Rate is controlled off diltiazem, will continue oral metoprolol for atrial fibrillation.  Patient is stable to be transferred to telemetry bed.  Discontinue long-acting insulin, check cortisol level, encourage oral intake and continue IV D5.  Follow CBGs.    Consultants:   General surgery  Procedures:   See below for x-ray reports  Antimicrobials:  Anti-infectives (From admission, onward)   Start     Dose/Rate Route Frequency Ordered Stop   04/23/19 1315  amoxicillin-clavulanate (AUGMENTIN) 875-125 MG per tablet 1 tablet     1 tablet Oral Every 12 hours 04/23/19 1310        Subjective: Afebrile, no chest pain, no shortness of breath, no vomiting.  Patient reporting intermittent episode of nausea, decreased oral intake and had 3-4 episode of hypoglycemia overnight.  Objective: Vitals:   04/24/19 1020 04/24/19 1100 04/24/19 1200 04/24/19 1245  BP: 116/70 107/89 102/83   Pulse:  (!) 128 (!) 149 (!) 101  Resp: 17 14 16 17   Temp:      TempSrc:      SpO2:  97% 98% 96%  Weight:      Height:        Intake/Output Summary (Last 24 hours) at 04/24/2019 1554 Last data filed at 04/24/2019 0956 Gross per 24 hour  Intake 237.73 ml  Output 800 ml  Net -562.27 ml   Filed Weights   04/22/19 1753 04/23/19 0400 04/24/19 0500  Weight: 89.4 kg 92.3 kg 99.4 kg  Examination: General exam: Alert, awake, oriented x 3; afebrile, denies chest pain, no vomiting.  Patient reported intermittent episode of nausea and anorexia.  She has experience overnight 3-4 different episode of hypoglycemia. Respiratory system: Clear to auscultation. Respiratory effort normal. Cardiovascular system:RRR. No murmurs, rubs, gallops. Gastrointestinal system: Abdomen is nondistended,  soft and mildly tender to palpation around [place].Marland Kitchen No organomegaly or masses felt. Normal bowel sounds heard. Central nervous system: Alert and oriented. No focal neurological deficits. Extremities: No cyanosis or clubbing.  No edema. Skin: No rashes, no petechiae.  Wound VAC appreciated in her right lower quadrant/mid abdomen.  No surrounding erythema appreciated. Psychiatry: Judgement and insight appear normal. Mood & affect appropriate.    Data Reviewed: I have personally reviewed following labs and imaging studies  CBC: Recent Labs  Lab 04/22/19 1847 04/23/19 0337 04/24/19 0418  WBC 13.5* 12.8* 15.2*  NEUTROABS  --  11.3*  --   HGB 9.5* 9.3* 8.5*  HCT 31.9* 31.6* 28.7*  MCV 87.9 90.8 89.1  PLT 635* 615* 268*   Basic Metabolic Panel: Recent Labs  Lab 04/22/19 1847 04/23/19 0337 04/24/19 0418  NA 133* 133* 131*  K 4.5 4.4 3.5  CL 101 102 101  CO2 19* 21* 23  GLUCOSE 348* 306* 96  BUN 22 22 21   CREATININE 1.08* 0.99 0.93  CALCIUM 7.9* 7.8* 7.4*   GFR: Estimated Creatinine Clearance: 66.4 mL/min (by C-G formula based on SCr of 0.93 mg/dL).   Liver Function Tests: Recent Labs  Lab 04/22/19 1847 04/23/19 0337  AST 10* 9*  ALT 10 11  ALKPHOS 131* 124  BILITOT 0.6 0.8  PROT 5.9* 5.6*  ALBUMIN 2.4* 2.4*   CBG: Recent Labs  Lab 04/24/19 0218 04/24/19 0359 04/24/19 0723 04/24/19 0757 04/24/19 1142  GLUCAP 58* 114* 57* 73 200*   Urine analysis:    Component Value Date/Time   COLORURINE YELLOW 03/07/2019 2104   APPEARANCEUR HAZY (A) 03/07/2019 2104   LABSPEC 1.024 03/07/2019 2104   PHURINE 6.0 03/07/2019 2104   GLUCOSEU NEGATIVE 03/07/2019 2104   HGBUR NEGATIVE 03/07/2019 2104   BILIRUBINUR NEGATIVE 03/07/2019 2104   KETONESUR NEGATIVE 03/07/2019 2104   PROTEINUR 100 (A) 03/07/2019 2104   UROBILINOGEN 0.2 12/01/2007 1025   NITRITE NEGATIVE 03/07/2019 2104   LEUKOCYTESUR MODERATE (A) 03/07/2019 2104    Recent Results (from the past 240 hour(s))    SARS Coronavirus 2 [facility] order, Performed in [facility] hospital lab) Nasopharyngeal Nasopharyngeal Swab     Status: None   Collection Time: 04/23/19 12:03 AM   Specimen: Nasopharyngeal Swab  Result Value Ref Range Status   SARS Coronavirus 2 NEGATIVE NEGATIVE Final    Comment: (NOTE) If result is NEGATIVE SARS-CoV-2 target nucleic acids are NOT DETECTED. The SARS-CoV-2 RNA is generally detectable in upper and lower  respiratory specimens during the acute phase of infection. The lowest  concentration of SARS-CoV-2 viral copies this assay can detect is 250  copies / mL. A negative result does not preclude SARS-CoV-2 infection  and should not be used as the sole basis for treatment or other  patient management decisions.  A negative result may occur with  improper specimen collection / handling, submission of specimen other  than nasopharyngeal swab, presence of viral mutation(s) within the  areas targeted by this assay, and inadequate number of viral copies  (<250 copies / mL). A negative result must be combined with clinical  observations, patient history, and epidemiological information. If result is POSITIVE SARS-CoV-2 target nucleic  acids are DETECTED. The SARS-CoV-2 RNA is generally detectable in upper and lower  respiratory specimens dur ing the acute phase of infection.  Positive  results are indicative of active infection with SARS-CoV-2.  Clinical  correlation with patient history and other diagnostic information is  necessary to determine patient infection status.  Positive results do  not rule out bacterial infection or co-infection with other viruses. If result is PRESUMPTIVE POSTIVE SARS-CoV-2 nucleic acids MAY BE PRESENT.   A presumptive positive result was obtained on the submitted specimen  and confirmed on repeat testing.  While 2019 novel coronavirus  (SARS-CoV-2) nucleic acids may be present in the submitted sample  additional confirmatory testing may be  necessary for epidemiological  and / or clinical management purposes  to differentiate between  SARS-CoV-2 and other Sarbecovirus currently known to infect humans.  If clinically indicated additional testing with an alternate test  methodology [phone]) is advised. The SARS-CoV-2 RNA is generally  detectable in upper and lower respiratory sp ecimens during the acute  phase of infection. The expected result is Negative. Fact Sheet for Patients:  StrictlyIdeas.no Fact Sheet for Healthcare Providers: BankingDealers.co.za This test is not yet approved or cleared by the [place] FDA and has been authorized for detection and/or diagnosis of SARS-CoV-2 by FDA under an Emergency Use Authorization (EUA).  This EUA will remain in effect (meaning this test can be used) for the duration of the COVID-19 declaration under Section 564(b)(1) of the Act, 21 U.S.C. section 360bbb-3(b)(1), unless the authorization is terminated or revoked sooner. Performed at [facility], [address] [place], [place] [address]   MRSA PCR Screening     Status: Abnormal   Collection Time: 04/23/19  2:56 AM   Specimen: Nasal Mucosa; Nasopharyngeal  Result Value Ref Range Status   MRSA by PCR POSITIVE (A) NEGATIVE Final    Comment:        The GeneXpert MRSA Assay (FDA approved for NASAL specimens only), is one component of a comprehensive MRSA colonization surveillance program. It is not intended to diagnose MRSA infection nor to guide or monitor treatment for MRSA infections. RESULT CALLED TO, READ BACK BY AND VERIFIED WITH: HYLTON @ 1122 ON [id number] BY HENDERSON L. Performed at [facility], [address], [place], [place] [address]   Aerobic/Anaerobic Culture (surgical/deep wound)     Status: None (Preliminary result)   Collection Time: 04/23/19  1:00 PM   Specimen: Abdomen; Abscess  Result Value Ref Range Status   Specimen Description   Final     ABDOMEN Performed at [facility], [address], [place], [place] [address]    Special Requests   Final    NONE Performed at [facility], [address] [place], [place] [address]    Gram Stain   Final    ABUNDANT WBC PRESENT,BOTH PMN AND MONONUCLEAR MODERATE GRAM NEGATIVE RODS FEW GRAM POSITIVE RODS Performed at [facility], [place] [address] [place], [place] [address]    Culture MODERATE GRAM NEGATIVE RODS  Final   Report Status PENDING  Incomplete     Radiology Studies: Ct Angio Chest Pe W And/or Wo Contrast  Result Date: 04/22/2019 CLINICAL DATA:  Abdominal pain, acute shortness of breath EXAM: CT ANGIOGRAPHY CHEST CT ABDOMEN AND PELVIS WITH CONTRAST TECHNIQUE: Multidetector CT imaging of the chest was performed using the standard protocol during bolus administration of intravenous contrast. Multiplanar CT image reconstructions and MIPs were obtained to evaluate the vascular anatomy. Multidetector CT imaging of  the abdomen and pelvis was performed using the standard protocol during bolus administration of intravenous contrast. CONTRAST:  171m OMNIPAQUE IOHEXOL 350 MG/ML SOLN COMPARISON:  CT abdomen pelvis March 07, 2019 FINDINGS: CTA CHEST FINDINGS Cardiovascular: There is a optimal opacification of the pulmonary arteries. There is no central,segmental, or subsegmental filling defects within the pulmonary arteries. There is mild cardiomegaly. There is dense mitral valve and coronary artery calcifications. Aortic atherosclerotic calcifications are seen. There is normal three-vessel brachiocephalic anatomy without proximal stenosis. Mediastinum/Nodes: No hilar, mediastinal, or axillary adenopathy. Thyroid gland, trachea, and esophagus demonstrate no significant findings. Lungs/Pleura: There is mildly hazy ground-glass opacities seen throughout both lungs. Mild interstitial prominence seen predominantly within the upper lungs. No large airspace consolidation seen. No pleural effusions.  Upper Abdomen: No acute abnormalities present in the visualized portions of the upper abdomen. Surgical clips within the gallbladder bed. Musculoskeletal: No chest wall abnormality. No acute or significant osseous findings. Stable slight anterior wedge compression deformity of the T6 vertebral body. Review of the MIP images confirms the above findings. CT ABDOMEN and PELVIS FINDINGS Lower chest: There is mild cardiomegaly. A small hiatal hernia is present. Hepatobiliary: The liver is normal in density without focal abnormality.The main portal vein is patent. The patient is status post cholecystectomy. No biliary ductal dilation. Pancreas: Unremarkable. No pancreatic ductal dilatation or surrounding inflammatory changes. Spleen: Normal in size without focal abnormality. Adrenals/Urinary Tract: Both adrenal glands appear normal. Cortical scarring seen within both kidneys. A small subcentimeter hypodense lesion seen within the upper pole the left kidney. No hydronephrosis. Stomach/Bowel: The stomach, small bowel, and colon are normal in appearance. Scattered colonic diverticula noted without diverticulitis. No inflammatory changes, wall thickening, or obstructive findings.The appendix is not well seen. Vascular/Lymphatic: There are no enlarged mesenteric, retroperitoneal, or pelvic lymph nodes. Scattered aortic atherosclerotic calcifications are seen without aneurysmal dilatation. Reproductive: The patient is status post hysterectomy. No adnexal masses or collections seen. Again noted is a right adnexal ovarian cyst measuring 4 cm. Other: Along the right lower anterior abdominal wall there is is surgical wound with a small amount of subcutaneous emphysema. There is a new small fluid collection measuring 4.2 cm seen within this area. Subcutaneous fat stranding changes are seen along the anterior abdominal wall, predominantly within the right lower quadrant. Musculoskeletal: No acute or significant osseous findings.  Grade 1 anterolisthesis of L4 on L5 is seen. Review of the MIP images confirms the above findings. IMPRESSION: 1. No central, segmental, or subsegmental pulmonary embolism. 2. Mild cardiomegaly. 3. Dense mitral valve and coronary artery calcifications. 4. Findings suggestive of mild pulmonary edema. 5. Diverticula without diverticulitis 6. Right anterior abdominal wall surgical wound with a adjacent new small fluid collection measuring 4.2 cm in the subcutaneous tissues. This could represent a postoperative seroma, phlegmon, or early superficial abscess. Electronically Signed   By: BPrudencio PairM.D.   On: 04/22/2019 22:50   Ct Abdomen Pelvis W Contrast  Result Date: 04/22/2019 CLINICAL DATA:  Abdominal pain, acute shortness of breath EXAM: CT ANGIOGRAPHY CHEST CT ABDOMEN AND PELVIS WITH CONTRAST TECHNIQUE: Multidetector CT imaging of the chest was performed using the standard protocol during bolus administration of intravenous contrast. Multiplanar CT image reconstructions and MIPs were obtained to evaluate the vascular anatomy. Multidetector CT imaging of the abdomen and pelvis was performed using the standard protocol during bolus administration of intravenous contrast. CONTRAST:  1033mOMNIPAQUE IOHEXOL 350 MG/ML SOLN COMPARISON:  CT abdomen pelvis March 07, 2019 FINDINGS: CTA CHEST FINDINGS Cardiovascular: There  is a optimal opacification of the pulmonary arteries. There is no central,segmental, or subsegmental filling defects within the pulmonary arteries. There is mild cardiomegaly. There is dense mitral valve and coronary artery calcifications. Aortic atherosclerotic calcifications are seen. There is normal three-vessel brachiocephalic anatomy without proximal stenosis. Mediastinum/Nodes: No hilar, mediastinal, or axillary adenopathy. Thyroid gland, trachea, and esophagus demonstrate no significant findings. Lungs/Pleura: There is mildly hazy ground-glass opacities seen throughout both lungs. Mild  interstitial prominence seen predominantly within the upper lungs. No large airspace consolidation seen. No pleural effusions. Upper Abdomen: No acute abnormalities present in the visualized portions of the upper abdomen. Surgical clips within the gallbladder bed. Musculoskeletal: No chest wall abnormality. No acute or significant osseous findings. Stable slight anterior wedge compression deformity of the T6 vertebral body. Review of the MIP images confirms the above findings. CT ABDOMEN and PELVIS FINDINGS Lower chest: There is mild cardiomegaly. A small hiatal hernia is present. Hepatobiliary: The liver is normal in density without focal abnormality.The main portal vein is patent. The patient is status post cholecystectomy. No biliary ductal dilation. Pancreas: Unremarkable. No pancreatic ductal dilatation or surrounding inflammatory changes. Spleen: Normal in size without focal abnormality. Adrenals/Urinary Tract: Both adrenal glands appear normal. Cortical scarring seen within both kidneys. A small subcentimeter hypodense lesion seen within the upper pole the left kidney. No hydronephrosis. Stomach/Bowel: The stomach, small bowel, and colon are normal in appearance. Scattered colonic diverticula noted without diverticulitis. No inflammatory changes, wall thickening, or obstructive findings.The appendix is not well seen. Vascular/Lymphatic: There are no enlarged mesenteric, retroperitoneal, or pelvic lymph nodes. Scattered aortic atherosclerotic calcifications are seen without aneurysmal dilatation. Reproductive: The patient is status post hysterectomy. No adnexal masses or collections seen. Again noted is a right adnexal ovarian cyst measuring 4 cm. Other: Along the right lower anterior abdominal wall there is is surgical wound with a small amount of subcutaneous emphysema. There is a new small fluid collection measuring 4.2 cm seen within this area. Subcutaneous fat stranding changes are seen along the anterior  abdominal wall, predominantly within the right lower quadrant. Musculoskeletal: No acute or significant osseous findings. Grade 1 anterolisthesis of L4 on L5 is seen. Review of the MIP images confirms the above findings. IMPRESSION: 1. No central, segmental, or subsegmental pulmonary embolism. 2. Mild cardiomegaly. 3. Dense mitral valve and coronary artery calcifications. 4. Findings suggestive of mild pulmonary edema. 5. Diverticula without diverticulitis 6. Right anterior abdominal wall surgical wound with a adjacent new small fluid collection measuring 4.2 cm in the subcutaneous tissues. This could represent a postoperative seroma, phlegmon, or early superficial abscess. Electronically Signed   By: Prudencio Pair M.D.   On: 04/22/2019 22:50   Scheduled Meds:  amoxicillin-clavulanate  1 tablet Oral Q12H   apixaban  5 mg Oral BID   buPROPion  300 mg Oral Daily   Chlorhexidine Gluconate Cloth  6 each Topical Daily   Chlorhexidine Gluconate Cloth  6 each Topical Q0600   escitalopram  10 mg Oral Daily   feeding supplement  1 Container Oral TID BM   ferrous sulfate  325 mg Oral BID WC   furosemide  40 mg Oral BID   insulin aspart  0-5 Units Subcutaneous QHS   insulin aspart  0-9 Units Subcutaneous TID WC   metoprolol tartrate  75 mg Oral BID   multivitamin-lutein  1 capsule Oral Daily   mupirocin ointment  1 application Nasal BID   nutrition supplement (JUVEN)  1 packet Oral BID BM   pantoprazole  40 mg Oral QAC breakfast   sodium chloride flush  3 mL Intravenous Q12H   Continuous Infusions:  sodium chloride     dextrose 50 mL/hr at 04/24/19 0803     LOS: 2 days   Time spent: 30 minutes  Barton Dubois, MD [facility] Pager [phone]   04/24/2019, 3:54 PM

## 2019-04-24 NOTE — Progress Notes (Signed)
[facility]  Rn reporting patient is not going home today. She is normally on a MWF schedule for vac changes. She has a medela vac from home in place. She will need to have her wound vacuum changed tomorrow by an Therapist, sports. If bedside RNs not comfortable, would suggest calling wound RN.  Has 2 black sponges in place, 1 in cavity medially that will need to be removed and replaced and one in the lateral portion of the wound that will need to be replaced.  Patient needs to get family to bring a few of her lily pads (suction pads) for the medela to the hospital, because they will need this for the change tomorrow.   If she is not able to bring any to hospital, then will need to be changed over to a [employer]CI vac from the hospital so that we can use the hospital lily pads.  She would then have to changed back to her home vac before discharge, so she will need her medela lily pads regardless for d/c home.   Discussed with RNs. They will let patient know as she is sleeping currently.  Home health RN already aware of patient being in hospital and new cavity that needs to be packed with sponge and the 2 sponges in the wound.   Curlene Labrum, MD [facility] [address] [place], [place] [address] [phone] (office)

## 2019-04-24 NOTE — Progress Notes (Signed)
Lunch time BG 200. Per verbal order by Dr. Dyann Kief, will hold sliding scale insulin coverage unless greater than 200 since pt's BG's have been running low.

## 2019-04-24 NOTE — Progress Notes (Signed)
Hypoglycemic Event  CBG: 57  Treatment: Grape juice (per pt request) and breakfast tray given. popsicle given. Pt encouraged to try and eat. D5 administering at 30 ml/hr IV.   Symptoms: not symptomatic   Possible Reasons for Event: unknown  Dr. Dyann Kief notified. Will recheck BG after pt done eating.    [name]

## 2019-04-24 NOTE — Progress Notes (Signed)
Recheck of cbg shows glucose of 58. D50 given. MD notified via text page.

## 2019-04-25 DIAGNOSIS — I5032 Chronic diastolic (congestive) heart failure: Secondary | ICD-10-CM

## 2019-04-25 LAB — CBC
HCT: 29.2 % — ABNORMAL LOW (ref 36.0–46.0)
Hemoglobin: 8.7 g/dL — ABNORMAL LOW (ref 12.0–15.0)
MCH: 26 pg (ref 26.0–34.0)
MCHC: 29.8 g/dL — ABNORMAL LOW (ref 30.0–36.0)
MCV: 87.4 fL (ref 80.0–100.0)
Platelets: 526 10*3/uL — ABNORMAL HIGH (ref 150–400)
RBC: 3.34 MIL/uL — ABNORMAL LOW (ref 3.87–5.11)
RDW: 14.6 % (ref 11.5–15.5)
WBC: 15.5 10*3/uL — ABNORMAL HIGH (ref 4.0–10.5)
nRBC: 0.3 % — ABNORMAL HIGH (ref 0.0–0.2)

## 2019-04-25 LAB — GLUCOSE, CAPILLARY
Glucose-Capillary: 239 mg/dL — ABNORMAL HIGH (ref 70–99)
Glucose-Capillary: 152 mg/dL — ABNORMAL HIGH (ref 70–99)
Glucose-Capillary: 81 mg/dL (ref 70–99)

## 2019-04-25 LAB — CORTISOL: Cortisol, Plasma: 21.5 ug/dL

## 2019-04-25 MED ORDER — AMOXICILLIN-POT CLAVULANATE 875-125 MG PO TABS: 1.0000 | ORAL_TABLET | Freq: Two times a day (BID) | ORAL | 0 refills | Status: DC

## 2019-04-25 MED ORDER — GLIPIZIDE 5 MG PO TABS: 5.0000 mg | ORAL_TABLET | Freq: Two times a day (BID) | ORAL | Status: DC

## 2019-04-25 MED ORDER — JUVEN PO PACK: 1.0000 | PACK | Freq: Two times a day (BID) | ORAL | 2 refills | Status: DC

## 2019-04-25 MED ORDER — INSULIN DETEMIR 100 UNIT/ML ~~LOC~~ SOLN
5.0000 [IU] | Freq: Two times a day (BID) | SUBCUTANEOUS | Status: DC
  Administered 2019-04-25: 5 [IU] via SUBCUTANEOUS
  Filled 2019-04-25 (×3): qty 0.05

## 2019-04-25 NOTE — TOC Transition Note (Signed)
Transition of Care [facility]) - CM/SW Discharge Note   Patient Details  Name: [name] MRN: [id number] Date of Birth: 04/17/1951  Transition of Care [facility]) CM/SW Contact:  Ihor Gully, LCSW Phone Number: 04/25/2019, 4:05 PM   Clinical Narrative:    [name] with [facility] and notified of patient's discharge.  HH orders placed. LCSW signing off.      Barriers to Discharge: Continued Medical Work up   Patient Goals and CMS Choice Patient states their goals for this hospitalization and ongoing recovery are:: [name]      Discharge Placement                       Discharge Plan and Services                                     Social Determinants of Health (SDOH) Interventions     Readmission Risk Interventions Readmission Risk Prevention Plan 04/25/2019 03/11/2019 03/10/2019  Transportation Screening Complete - Complete  Medication Review Press photographer) Complete Complete -  PCP or Specialist appointment within 3-5 days of discharge Not Complete Complete -  PCP/Specialist Appt Not Complete comments office closed due to labor day holiday - -  [place] or Home Care Consult Complete - Complete  SW Recovery Care/Counseling Consult Complete - Complete  Palliative Care Screening Not Applicable - Not [place] Not Applicable - Not Applicable  Some recent data might be hidden

## 2019-04-25 NOTE — Progress Notes (Signed)
Nsg Discharge Note  Admit Date:  04/22/2019 Discharge date: 04/25/2019   [name] to be D/C'd to home per MD order.  AVS completed.  Copy for chart, and copy for patient signed, and dated. Patient able to verbalize understanding.  Discharge Medication: Allergies as of 04/25/2019      Reactions   Feraheme [ferumoxytol] Other (See Comments)   Back pain (yelling out with back pain)   Propranolol Swelling   Pt states it may have been leg swelling   Topamax [topiramate] Other (See Comments)   hallucinations   Latex Itching, Rash      Medication List    STOP taking these medications   Santyl ointment Generic drug: collagenase     TAKE these medications   acetaminophen 500 MG tablet Commonly known as: TYLENOL Take 500-1,000 mg by mouth every 8 (eight) hours as needed for mild pain or headache.   albuterol 108 (90 Base) MCG/ACT inhaler Commonly known as: VENTOLIN HFA Inhale 2 puffs into the lungs every 6 (six) hours as needed for wheezing or shortness of breath. What changed: reasons to take this   amoxicillin-clavulanate 875-125 MG tablet Commonly known as: AUGMENTIN Take 1 tablet by mouth every 12 (twelve) hours.   apixaban 5 MG Tabs tablet Commonly known as: ELIQUIS Take 1 tablet (5 mg total) by mouth 2 (two) times daily.   buPROPion 300 MG 24 hr tablet Commonly known as: WELLBUTRIN XL Take 300 mg by mouth daily.   diltiazem 120 MG 24 hr capsule Commonly known as: CARDIZEM CD Take 1 capsule (120 mg total) by mouth daily. What changed: additional instructions   escitalopram 10 MG tablet Commonly known as: LEXAPRO Take 10 mg by mouth daily.   ferrous sulfate 325 (65 FE) MG tablet Commonly known as: FerrouSul Take 1 tablet (325 mg total) by mouth 2 (two) times daily with a meal.   fluticasone 50 MCG/ACT nasal spray Commonly known as: FLONASE Place 2 sprays into both nostrils daily as needed for allergies or rhinitis (congestion).   furosemide 20 MG  tablet Commonly known as: LASIX Take 2 tablets (40 mg total) by mouth 2 (two) times daily.   glipiZIDE 5 MG tablet Commonly known as: GLUCOTROL Take 1 tablet (5 mg total) by mouth 2 (two) times daily before a meal. What changed:   medication strength  how much to take  when to take this   levalbuterol 0.63 MG/3ML nebulizer solution Commonly known as: XOPENEX Take 0.63 mg by nebulization every 6 (six) hours as needed for wheezing or shortness of breath.   Levemir FlexTouch 100 UNIT/ML Pen Generic drug: Insulin Detemir Inject 10 Units into the skin 2 (two) times a day.   metoprolol tartrate 50 MG tablet Commonly known as: LOPRESSOR Take 50 mg by mouth 2 (two) times daily.   nutrition supplement (JUVEN) Pack Take 1 packet by mouth 2 (two) times daily between meals.   nystatin powder Commonly known as: MYCOSTATIN/NYSTOP Apply topically daily as needed (rash in skin folds).   OVER THE COUNTER MEDICATION Apply 1 application topically daily as needed (rash in skin folds). Baby butt paste   oxyCODONE 5 MG immediate release tablet Commonly known as: Oxy IR/ROXICODONE Take 1 tablet (5 mg total) by mouth every 6 (six) hours as needed for severe pain (wound vacuum changes).   pantoprazole 40 MG tablet Commonly known as: PROTONIX Take 1 tablet (40 mg total) by mouth daily before breakfast. What changed:   when to take this  reasons  to take this       Discharge Assessment: Vitals:   04/25/19 0540 04/25/19 1419  BP: 109/75 131/87  Pulse: (!) 50 (!) 116  Resp: 20 16  Temp: 97.7 F (36.5 C) 97.6 F (36.4 C)  SpO2: 98% 93%   Skin clean, dry and intact without evidence of skin break down, no evidence of skin tears noted. IV catheter discontinued intact. Site without signs and symptoms of complications - no redness or edema noted at insertion site, patient denies c/o pain - only slight tenderness at site.  Dressing with slight pressure applied.  D/c  Instructions-Education: Discharge instructions given to patient with verbalized understanding. D/c education completed with patient including follow up instructions, medication list, d/c activities limitations if indicated, with other d/c instructions as indicated by MD - patient able to verbalize understanding, all questions fully answered. Patient instructed to return to ED, call 911, or call MD for any changes in condition.  Patient escorted via [place], and D/C home via private auto.  Jonette Mate, RN 04/25/2019 5:38 PM

## 2019-04-25 NOTE — Discharge Summary (Signed)
Physician Discharge Summary  [name] [id number] DOB: November 21, 1950 DOA: 04/22/2019  PCP: [name], MD  Admit date: 04/22/2019 Discharge date: 04/25/2019  Time spent: 35 minutes  Recommendations for Outpatient Follow-up:  1. Repeat basic metabolic panel to follow electrolytes and renal function 2. Close follow-up with the patient CBGs with further adjustment to hypoglycemic regimen as needed 3. Reassess blood pressure and further adjust antihypertensive regimen as required.   Discharge Diagnoses:  Principal Problem:   Atrial fibrillation with RVR ([place]) Active Problems:   HTN (hypertension)   Depression   Chronic diastolic HF (heart failure) (H[facility])   CKD (chronic kidney disease), stage III ([place])   Diabetes ([place])   Discharge Condition: Stable and improved.  Patient discharged home with instruction to follow-up with PCP and general surgery as an outpatient.  Diet recommendation: Heart healthy and modified carbohydrate diet.  Filed Weights   04/23/19 0400 04/24/19 0500 04/25/19 0540  Weight: 92.3 kg 99.4 kg 93.6 kg    Brief history of present illness:  68 y.o.female,w hyeprtension, Dm2, CKD stage3, diastolic CHF , Pafib, Copd, Depression, w recent admission for chronic abdominal wound 03/07/2019 w new diagnosis of pyoderma gangrenosum on chronic prednisone therapy apparently had bs >500 earlier today and therefore came to ER. Pt was noted on arrival to have Afib with RVR.   Pt notes that she didn't take her metoprolol this am due to nausea. Pt has not been able to eat well over the past few days. Pt denies fever, chills, cough, cp, palp, sob, emesis, abd pain, diarrhea, brbpr, dysuria.   In ED, Temperature 97.8 pulse 198 respiratory rate 20 blood pressure 120/110 pulse ox 96% on room air weight 89.4 kg  Hospital Course:  1-atrial fibrillation with RVR (HCC) -Most likely trigger from dehydration in the setting of hyperglycemia and small skin abscess.   -continue metoprolol at current dose and resume home dose of cardizem -advised to maintain adequate hydraiton  2-type 2 diabetes mellitus with hyperglycemia on presentation. -Patient experience couple episode of hypoglycemia while inpatient. -At discharge CBGs in the 230 range -Glipizide dose adjusted and patient instructed to resume home dose insulin. -not longer on steroids at discharge. -advise to maintain adequate hydration and instructed not to skip meals. -A1C 9.9  3-HTN (hypertension) -Soft vitals, but stable overall -Continue monitoring vital signs and further adjust antihypertensive agents as needed. . -advise to follow heart healthy diet and to keep herself well hydrated.   4-Depression -Stable mood -No suicidal ideation or hallucination -Continue Lexapro and Wellbutrin  5-GERD -continue PPI  6-chronic kidney disease is stage III -Appears to be stable at baseline -Continue monitoring electrolytes and renal function trend. -Patient advised to maintain adequate hydration.  7-Pyoderma gangrenosum/small superficial abscess. -Follow general surgery recommendation -There was a small fluid collection 4.2 cm, s/p drainage by general surgery at bedside. -WBC's elevated, but not fever -started on augmentin as per general surgery recommendations. -continue wound care and outpatient follow up with Dr. Constance Haw.   Procedures:  See below for x-ray reports  Consultations:  General surgery  Discharge Exam: Vitals:   04/25/19 0540 04/25/19 1419  BP: 109/75 131/87  Pulse: (!) 50 (!) 116  Resp: 20 16  Temp: 97.7 F (36.5 C) 97.6 F (36.4 C)  SpO2: 98% 93%    General: Alert, awake and oriented x3; afebrile, denies chest pain, no further episode of nausea vomiting.  Reports that she is eating better.  No further episode of hypoglycemia appreciated. Cardiovascular: Rate controlled, no murmur,  no rubs, no gallops. Respiratory: Clear to quotation bilaterally, normal  respiratory effort. Abdomen: Soft, no guarding, positive bowel sounds.  Mild tenderness to palpation around abdominal wound where wound VAC is in place. Extremities: No edema, no cyanosis, no clubbing. Skin: No rashes. Wound VAC appreciated in her right lower quadrant/mid abdomen.  No surrounding erythema appreciated.  No petechiae  Discharge Instructions   Discharge Instructions    (HEART FAILURE PATIENTS) Call MD:  Anytime you have any of the following symptoms: 1) 3 pound weight gain in 24 hours or 5 pounds in 1 week 2) shortness of breath, with or without a dry hacking cough 3) swelling in the hands, feet or stomach 4) if you have to sleep on extra pillows at night in order to breathe.   Complete by: As directed    Diet - low sodium heart healthy   Complete by: As directed    Discharge instructions   Complete by: As directed    Maintain adequate hydration Make sure not to skip any meals Take medications as prescribed Arrange follow-up with PCP in 1 week Continue to follow instructions and recommendation by wound care and home health services.   Increase activity slowly   Complete by: As directed      Allergies as of 04/25/2019      Reactions   Feraheme [ferumoxytol] Other (See Comments)   Back pain (yelling out with back pain)   Propranolol Swelling   Pt states it may have been leg swelling   Topamax [topiramate] Other (See Comments)   hallucinations   Latex Itching, Rash      Medication List    STOP taking these medications   Santyl ointment Generic drug: collagenase     TAKE these medications   acetaminophen 500 MG tablet Commonly known as: TYLENOL Take 500-1,000 mg by mouth every 8 (eight) hours as needed for mild pain or headache.   albuterol 108 (90 Base) MCG/ACT inhaler Commonly known as: VENTOLIN HFA Inhale 2 puffs into the lungs every 6 (six) hours as needed for wheezing or shortness of breath. What changed: reasons to take this   amoxicillin-clavulanate  875-125 MG tablet Commonly known as: AUGMENTIN Take 1 tablet by mouth every 12 (twelve) hours.   apixaban 5 MG Tabs tablet Commonly known as: ELIQUIS Take 1 tablet (5 mg total) by mouth 2 (two) times daily.   buPROPion 300 MG 24 hr tablet Commonly known as: WELLBUTRIN XL Take 300 mg by mouth daily.   diltiazem 120 MG 24 hr capsule Commonly known as: CARDIZEM CD Take 1 capsule (120 mg total) by mouth daily. What changed: additional instructions   escitalopram 10 MG tablet Commonly known as: LEXAPRO Take 10 mg by mouth daily.   ferrous sulfate 325 (65 FE) MG tablet Commonly known as: FerrouSul Take 1 tablet (325 mg total) by mouth 2 (two) times daily with a meal.   fluticasone 50 MCG/ACT nasal spray Commonly known as: FLONASE Place 2 sprays into both nostrils daily as needed for allergies or rhinitis (congestion).   furosemide 20 MG tablet Commonly known as: LASIX Take 2 tablets (40 mg total) by mouth 2 (two) times daily.   glipiZIDE 5 MG tablet Commonly known as: GLUCOTROL Take 1 tablet (5 mg total) by mouth 2 (two) times daily before a meal. What changed:   medication strength  how much to take  when to take this   levalbuterol 0.63 MG/3ML nebulizer solution Commonly known as: XOPENEX Take 0.63 mg by nebulization  every 6 (six) hours as needed for wheezing or shortness of breath.   Levemir FlexTouch 100 UNIT/ML Pen Generic drug: Insulin Detemir Inject 10 Units into the skin 2 (two) times a day.   metoprolol tartrate 50 MG tablet Commonly known as: LOPRESSOR Take 50 mg by mouth 2 (two) times daily.   nutrition supplement (JUVEN) Pack Take 1 packet by mouth 2 (two) times daily between meals.   nystatin powder Commonly known as: MYCOSTATIN/NYSTOP Apply topically daily as needed (rash in skin folds).   OVER THE COUNTER MEDICATION Apply 1 application topically daily as needed (rash in skin folds). Baby butt paste   oxyCODONE 5 MG immediate release  tablet Commonly known as: Oxy IR/ROXICODONE Take 1 tablet (5 mg total) by mouth every 6 (six) hours as needed for severe pain (wound vacuum changes).   pantoprazole 40 MG tablet Commonly known as: PROTONIX Take 1 tablet (40 mg total) by mouth daily before breakfast. What changed:   when to take this  reasons to take this      Allergies  Allergen Reactions  . Feraheme [Ferumoxytol] Other (See Comments)    Back pain (yelling out with back pain)  . Propranolol Swelling    Pt states it may have been leg swelling  . Topamax [Topiramate] Other (See Comments)    hallucinations  . Latex Itching and Rash   Follow-up Information    [name], MD. Schedule an appointment as soon as possible for a visit in 1 week(s).   Specialty: Internal Medicine Contact information: [address] [place] [address] [phone]        Herminio Commons, MD .   Specialty: Cardiology Contact information: [place] [address] [phone]           The results of significant diagnostics from this hospitalization (including imaging, microbiology, ancillary and laboratory) are listed below for reference.    Significant Diagnostic Studies: Ct Angio Chest Pe W And/or Wo Contrast  Result Date: 04/22/2019 CLINICAL DATA:  Abdominal pain, acute shortness of breath EXAM: CT ANGIOGRAPHY CHEST CT ABDOMEN AND PELVIS WITH CONTRAST TECHNIQUE: Multidetector CT imaging of the chest was performed using the standard protocol during bolus administration of intravenous contrast. Multiplanar CT image reconstructions and MIPs were obtained to evaluate the vascular anatomy. Multidetector CT imaging of the abdomen and pelvis was performed using the standard protocol during bolus administration of intravenous contrast. CONTRAST:  150m OMNIPAQUE IOHEXOL 350 MG/ML SOLN COMPARISON:  CT abdomen pelvis March 07, 2019 FINDINGS: CTA CHEST FINDINGS Cardiovascular: There is a  optimal opacification of the pulmonary arteries. There is no central,segmental, or subsegmental filling defects within the pulmonary arteries. There is mild cardiomegaly. There is dense mitral valve and coronary artery calcifications. Aortic atherosclerotic calcifications are seen. There is normal three-vessel brachiocephalic anatomy without proximal stenosis. Mediastinum/Nodes: No hilar, mediastinal, or axillary adenopathy. Thyroid gland, trachea, and esophagus demonstrate no significant findings. Lungs/Pleura: There is mildly hazy ground-glass opacities seen throughout both lungs. Mild interstitial prominence seen predominantly within the upper lungs. No large airspace consolidation seen. No pleural effusions. Upper Abdomen: No acute abnormalities present in the visualized portions of the upper abdomen. Surgical clips within the gallbladder bed. Musculoskeletal: No chest wall abnormality. No acute or significant osseous findings. Stable slight anterior wedge compression deformity of the T6 vertebral body. Review of the MIP images confirms the above findings. CT ABDOMEN and PELVIS FINDINGS Lower chest: There is mild cardiomegaly. A small hiatal hernia is present.  Hepatobiliary: The liver is normal in density without focal abnormality.The main portal vein is patent. The patient is status post cholecystectomy. No biliary ductal dilation. Pancreas: Unremarkable. No pancreatic ductal dilatation or surrounding inflammatory changes. Spleen: Normal in size without focal abnormality. Adrenals/Urinary Tract: Both adrenal glands appear normal. Cortical scarring seen within both kidneys. A small subcentimeter hypodense lesion seen within the upper pole the left kidney. No hydronephrosis. Stomach/Bowel: The stomach, small bowel, and colon are normal in appearance. Scattered colonic diverticula noted without diverticulitis. No inflammatory changes, wall thickening, or obstructive findings.The appendix is not well seen.  Vascular/Lymphatic: There are no enlarged mesenteric, retroperitoneal, or pelvic lymph nodes. Scattered aortic atherosclerotic calcifications are seen without aneurysmal dilatation. Reproductive: The patient is status post hysterectomy. No adnexal masses or collections seen. Again noted is a right adnexal ovarian cyst measuring 4 cm. Other: Along the right lower anterior abdominal wall there is is surgical wound with a small amount of subcutaneous emphysema. There is a new small fluid collection measuring 4.2 cm seen within this area. Subcutaneous fat stranding changes are seen along the anterior abdominal wall, predominantly within the right lower quadrant. Musculoskeletal: No acute or significant osseous findings. Grade 1 anterolisthesis of L4 on L5 is seen. Review of the MIP images confirms the above findings. IMPRESSION: 1. No central, segmental, or subsegmental pulmonary embolism. 2. Mild cardiomegaly. 3. Dense mitral valve and coronary artery calcifications. 4. Findings suggestive of mild pulmonary edema. 5. Diverticula without diverticulitis 6. Right anterior abdominal wall surgical wound with a adjacent new small fluid collection measuring 4.2 cm in the subcutaneous tissues. This could represent a postoperative seroma, phlegmon, or early superficial abscess. Electronically Signed   By: Prudencio Pair M.D.   On: 04/22/2019 22:50   Ct Abdomen Pelvis W Contrast  Result Date: 04/22/2019 CLINICAL DATA:  Abdominal pain, acute shortness of breath EXAM: CT ANGIOGRAPHY CHEST CT ABDOMEN AND PELVIS WITH CONTRAST TECHNIQUE: Multidetector CT imaging of the chest was performed using the standard protocol during bolus administration of intravenous contrast. Multiplanar CT image reconstructions and MIPs were obtained to evaluate the vascular anatomy. Multidetector CT imaging of the abdomen and pelvis was performed using the standard protocol during bolus administration of intravenous contrast. CONTRAST:  161m OMNIPAQUE  IOHEXOL 350 MG/ML SOLN COMPARISON:  CT abdomen pelvis March 07, 2019 FINDINGS: CTA CHEST FINDINGS Cardiovascular: There is a optimal opacification of the pulmonary arteries. There is no central,segmental, or subsegmental filling defects within the pulmonary arteries. There is mild cardiomegaly. There is dense mitral valve and coronary artery calcifications. Aortic atherosclerotic calcifications are seen. There is normal three-vessel brachiocephalic anatomy without proximal stenosis. Mediastinum/Nodes: No hilar, mediastinal, or axillary adenopathy. Thyroid gland, trachea, and esophagus demonstrate no significant findings. Lungs/Pleura: There is mildly hazy ground-glass opacities seen throughout both lungs. Mild interstitial prominence seen predominantly within the upper lungs. No large airspace consolidation seen. No pleural effusions. Upper Abdomen: No acute abnormalities present in the visualized portions of the upper abdomen. Surgical clips within the gallbladder bed. Musculoskeletal: No chest wall abnormality. No acute or significant osseous findings. Stable slight anterior wedge compression deformity of the T6 vertebral body. Review of the MIP images confirms the above findings. CT ABDOMEN and PELVIS FINDINGS Lower chest: There is mild cardiomegaly. A small hiatal hernia is present. Hepatobiliary: The liver is normal in density without focal abnormality.The main portal vein is patent. The patient is status post cholecystectomy. No biliary ductal dilation. Pancreas: Unremarkable. No pancreatic ductal dilatation or surrounding inflammatory changes. Spleen: Normal in  size without focal abnormality. Adrenals/Urinary Tract: Both adrenal glands appear normal. Cortical scarring seen within both kidneys. A small subcentimeter hypodense lesion seen within the upper pole the left kidney. No hydronephrosis. Stomach/Bowel: The stomach, small bowel, and colon are normal in appearance. Scattered colonic diverticula noted  without diverticulitis. No inflammatory changes, wall thickening, or obstructive findings.The appendix is not well seen. Vascular/Lymphatic: There are no enlarged mesenteric, retroperitoneal, or pelvic lymph nodes. Scattered aortic atherosclerotic calcifications are seen without aneurysmal dilatation. Reproductive: The patient is status post hysterectomy. No adnexal masses or collections seen. Again noted is a right adnexal ovarian cyst measuring 4 cm. Other: Along the right lower anterior abdominal wall there is is surgical wound with a small amount of subcutaneous emphysema. There is a new small fluid collection measuring 4.2 cm seen within this area. Subcutaneous fat stranding changes are seen along the anterior abdominal wall, predominantly within the right lower quadrant. Musculoskeletal: No acute or significant osseous findings. Grade 1 anterolisthesis of L4 on L5 is seen. Review of the MIP images confirms the above findings. IMPRESSION: 1. No central, segmental, or subsegmental pulmonary embolism. 2. Mild cardiomegaly. 3. Dense mitral valve and coronary artery calcifications. 4. Findings suggestive of mild pulmonary edema. 5. Diverticula without diverticulitis 6. Right anterior abdominal wall surgical wound with a adjacent new small fluid collection measuring 4.2 cm in the subcutaneous tissues. This could represent a postoperative seroma, phlegmon, or early superficial abscess. Electronically Signed   By: Prudencio Pair M.D.   On: 04/22/2019 22:50      Labs: Basic Metabolic Panel: Recent Labs  Lab 04/22/19 1847 04/23/19 0337 04/24/19 0418  NA 133* 133* 131*  K 4.5 4.4 3.5  CL 101 102 101  CO2 19* 21* 23  GLUCOSE 348* 306* 96  BUN 22 22 21   CREATININE 1.08* 0.99 0.93  CALCIUM 7.9* 7.8* 7.4*   Liver Function Tests: Recent Labs  Lab 04/22/19 1847 04/23/19 0337  AST 10* 9*  ALT 10 11  ALKPHOS 131* 124  BILITOT 0.6 0.8  PROT 5.9* 5.6*  ALBUMIN 2.4* 2.4*   CBC: Recent Labs  Lab  04/22/19 1847 04/23/19 0337 04/24/19 0418 04/25/19 0458  WBC 13.5* 12.8* 15.2* 15.5*  NEUTROABS  --  11.3*  --   --   HGB 9.5* 9.3* 8.5* 8.7*  HCT 31.9* 31.6* 28.7* 29.2*  MCV 87.9 90.8 89.1 87.4  PLT 635* 615* 552* 526*   BNP (last 3 results) Recent Labs    08/16/18 1848 01/10/19 1646 02/17/19 1522  BNP 464.0* 2,107.0* 1,510.0*   CBG: Recent Labs  Lab 04/24/19 1714 04/24/19 2117 04/25/19 0731 04/25/19 1108 04/25/19 1549  GLUCAP 297* 160* 239* 152* 81    Signed:  Barton Dubois MD.  [facility] 04/25/2019, 4:00 PM

## 2019-04-26 ENCOUNTER — Other Ambulatory Visit: Payer: Self-pay

## 2019-04-27 ENCOUNTER — Other Ambulatory Visit: Payer: Self-pay

## 2019-04-27 ENCOUNTER — Telehealth: Payer: Self-pay | Admitting: General Surgery

## 2019-04-27 ENCOUNTER — Encounter (HOSPITAL_COMMUNITY): Payer: Self-pay

## 2019-04-27 ENCOUNTER — Inpatient Hospital Stay (HOSPITAL_COMMUNITY)
Admission: EM | Admit: 2019-04-27 | Discharge: 2019-04-30 | Disposition: A | Discharge status: Home / Self Care | Payer: BC Managed Care – PPO | Source: Home / Self Care | Attending: Internal Medicine | Admitting: Internal Medicine

## 2019-04-27 DIAGNOSIS — L03311 Cellulitis of abdominal wall: Secondary | ICD-10-CM | POA: Diagnosis not present

## 2019-04-27 DIAGNOSIS — F329 Major depressive disorder, single episode, unspecified: Secondary | ICD-10-CM | POA: Diagnosis not present

## 2019-04-27 DIAGNOSIS — T8141XA Infection following a procedure, superficial incisional surgical site, initial encounter: Secondary | ICD-10-CM | POA: Diagnosis not present

## 2019-04-27 DIAGNOSIS — I5033 Acute on chronic diastolic (congestive) heart failure: Secondary | ICD-10-CM | POA: Diagnosis not present

## 2019-04-27 DIAGNOSIS — N183 Chronic kidney disease, stage 3 (moderate): Secondary | ICD-10-CM | POA: Diagnosis not present

## 2019-04-27 DIAGNOSIS — R531 Weakness: Secondary | ICD-10-CM

## 2019-04-27 DIAGNOSIS — E1122 Type 2 diabetes mellitus with diabetic chronic kidney disease: Secondary | ICD-10-CM | POA: Diagnosis not present

## 2019-04-27 DIAGNOSIS — L88 Pyoderma gangrenosum: Secondary | ICD-10-CM | POA: Diagnosis not present

## 2019-04-27 DIAGNOSIS — I482 Chronic atrial fibrillation, unspecified: Secondary | ICD-10-CM | POA: Diagnosis not present

## 2019-04-27 DIAGNOSIS — J449 Chronic obstructive pulmonary disease, unspecified: Secondary | ICD-10-CM | POA: Diagnosis not present

## 2019-04-27 DIAGNOSIS — Z4801 Encounter for change or removal of surgical wound dressing: Secondary | ICD-10-CM | POA: Diagnosis not present

## 2019-04-27 DIAGNOSIS — E1165 Type 2 diabetes mellitus with hyperglycemia: Secondary | ICD-10-CM | POA: Diagnosis not present

## 2019-04-27 DIAGNOSIS — Z95828 Presence of other vascular implants and grafts: Secondary | ICD-10-CM

## 2019-04-27 DIAGNOSIS — D509 Iron deficiency anemia, unspecified: Secondary | ICD-10-CM | POA: Diagnosis not present

## 2019-04-27 DIAGNOSIS — I4891 Unspecified atrial fibrillation: Secondary | ICD-10-CM | POA: Diagnosis present

## 2019-04-27 DIAGNOSIS — I130 Hypertensive heart and chronic kidney disease with heart failure and stage 1 through stage 4 chronic kidney disease, or unspecified chronic kidney disease: Secondary | ICD-10-CM | POA: Diagnosis not present

## 2019-04-27 LAB — CBC WITH DIFFERENTIAL/PLATELET
Abs Immature Granulocytes: 0.13 10*3/uL — ABNORMAL HIGH (ref 0.00–0.07)
Basophils Absolute: 0 10*3/uL (ref 0.0–0.1)
Basophils Relative: 0 %
Eosinophils Absolute: 0.1 10*3/uL (ref 0.0–0.5)
Eosinophils Relative: 0 %
HCT: 37 % (ref 36.0–46.0)
Hemoglobin: 10.3 g/dL — ABNORMAL LOW (ref 12.0–15.0)
Immature Granulocytes: 1 %
Lymphocytes Relative: 4 %
Lymphs Abs: 0.6 10*3/uL — ABNORMAL LOW (ref 0.7–4.0)
MCH: 25.4 pg — ABNORMAL LOW (ref 26.0–34.0)
MCHC: 27.8 g/dL — ABNORMAL LOW (ref 30.0–36.0)
MCV: 91.4 fL (ref 80.0–100.0)
Monocytes Absolute: 0.8 10*3/uL (ref 0.1–1.0)
Monocytes Relative: 5 %
Neutro Abs: 13.2 10*3/uL — ABNORMAL HIGH (ref 1.7–7.7)
Neutrophils Relative %: 90 %
Platelets: 435 10*3/uL — ABNORMAL HIGH (ref 150–400)
RBC: 4.05 MIL/uL (ref 3.87–5.11)
RDW: 14.9 % (ref 11.5–15.5)
WBC: 14.8 10*3/uL — ABNORMAL HIGH (ref 4.0–10.5)
nRBC: 0 % (ref 0.0–0.2)

## 2019-04-27 LAB — AEROBIC/ANAEROBIC CULTURE W GRAM STAIN (SURGICAL/DEEP WOUND)

## 2019-04-27 LAB — COMPREHENSIVE METABOLIC PANEL
ALT: 11 U/L (ref 0–44)
AST: 12 U/L — ABNORMAL LOW (ref 15–41)
Albumin: 2.3 g/dL — ABNORMAL LOW (ref 3.5–5.0)
Alkaline Phosphatase: 103 U/L (ref 38–126)
Anion gap: 13 (ref 5–15)
BUN: 16 mg/dL (ref 8–23)
CO2: 22 mmol/L (ref 22–32)
Calcium: 7.9 mg/dL — ABNORMAL LOW (ref 8.9–10.3)
Chloride: 98 mmol/L (ref 98–111)
Creatinine, Ser: 0.76 mg/dL (ref 0.44–1.00)
GFR calc Af Amer: >60 mL/min (ref >60)
GFR calc non Af Amer: >60 mL/min (ref >60)
Glucose, Bld: 273 mg/dL — ABNORMAL HIGH (ref 70–99)
Potassium: 3.5 mmol/L (ref 3.5–5.1)
Sodium: 133 mmol/L — ABNORMAL LOW (ref 135–145)
Total Bilirubin: 0.9 mg/dL (ref 0.3–1.2)
Total Protein: 5.8 g/dL — ABNORMAL LOW (ref 6.5–8.1)

## 2019-04-27 LAB — LACTIC ACID, PLASMA: Lactic Acid, Venous: 3.1 mmol/L (ref 0.5–1.9)

## 2019-04-27 MED ORDER — SODIUM CHLORIDE 0.9 % IV BOLUS
1000.0000 mL | Freq: Once | INTRAVENOUS | Status: AC
  Administered 2019-04-27: 1000 mL via INTRAVENOUS

## 2019-04-27 MED ORDER — ONDANSETRON 4 MG PO TBDP: 4.0000 mg | ORAL_TABLET | Freq: Three times a day (TID) | ORAL | 2 refills | Status: DC | PRN

## 2019-04-27 MED ORDER — METOPROLOL TARTRATE 5 MG/5ML IV SOLN
5.0000 mg | Freq: Once | INTRAVENOUS | Status: AC
  Administered 2019-04-27: 23:00:00 5 mg via INTRAVENOUS
  Filled 2019-04-27: qty 5

## 2019-04-27 MED ORDER — AMOXICILLIN-POT CLAVULANATE 875-125 MG PO TABS
1.0000 | ORAL_TABLET | Freq: Once | ORAL | Status: AC
  Administered 2019-04-27: 23:00:00 1 via ORAL
  Filled 2019-04-27: qty 1

## 2019-04-27 MED ORDER — OXYCODONE HCL 5 MG PO TABS: 5.0000 mg | ORAL_TABLET | Freq: Four times a day (QID) | ORAL | 0 refills | Status: DC | PRN

## 2019-04-27 MED ORDER — SODIUM CHLORIDE 0.9 % IV BOLUS
1000.0000 mL | Freq: Once | INTRAVENOUS | Status: AC
  Administered 2019-04-27: 22:00:00 1000 mL via INTRAVENOUS

## 2019-04-27 MED ORDER — ONDANSETRON HCL 4 MG/2ML IJ SOLN
4.0000 mg | Freq: Once | INTRAMUSCULAR | Status: AC
  Administered 2019-04-27: 4 mg via INTRAVENOUS
  Filled 2019-04-27: qty 2

## 2019-04-27 MED ORDER — HYDROMORPHONE HCL 1 MG/ML IJ SOLN
0.5000 mg | Freq: Once | INTRAMUSCULAR | Status: AC
  Administered 2019-04-27: 20:00:00 0.5 mg via INTRAVENOUS
  Filled 2019-04-27: qty 1

## 2019-04-27 MED ORDER — METOCLOPRAMIDE HCL 5 MG/ML IJ SOLN
10.0000 mg | Freq: Once | INTRAMUSCULAR | Status: AC
  Administered 2019-04-28: 10 mg via INTRAVENOUS
  Filled 2019-04-27: qty 2

## 2019-04-27 NOTE — ED Notes (Signed)
Labs collected successfully by phleb.

## 2019-04-27 NOTE — ED Triage Notes (Addendum)
Pt had a GI surgery performed by Dr. Constance Haw and was released on Sunday. Has a wound vac that was changed to a wet to dry today by nurse. Pt states she is nauseous, has a low BP, low O2, and low temp. Pt's VSS other than HR of 119. Pt weak since surgery.

## 2019-04-27 NOTE — ED Notes (Signed)
hospitalist in room

## 2019-04-27 NOTE — ED Notes (Signed)
Unable to get lab work. By 2 rn. Phleb notified

## 2019-04-27 NOTE — ED Notes (Signed)
Date and time results received: 04/27/19 2108  Test: Lactic Critical Value: 3.1  Name of Provider Notified: Roderic Palau, MD  Orders Received? Or Actions Taken?: acknowledged

## 2019-04-27 NOTE — Telephone Encounter (Signed)
[facility]  Called back by home health RN. She says the patient is hypothermic to 20F, shaking and cold. Wound RN also reporting patient feeling poorly.  Patient's wound had grown out ESBL E coli, but had been drained fully and no cellulitis. Had been sent on Augmentin but this did not cover the bacteria. I had talked to pharmacy as this just came back today and there were no good oral options. Since the patient had good drainage of the abscess and no cellulitis, I was not going to do another oral at this time.  At this time, I am worried there could be some bacteremia. The [facility] RN took down the wound vacuum to make sure no abscess or purulence remained and packed the area with a damp to dry gauze.    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  It is not a surprise that the wound is having issues healing as this is a problem with this diagnosis .  The patient is going to have to go back to the hospital now given the hypothermia.  BID damp to dry dressing changes to the midline wound, medial cavity also needs to be packed and measures about 4cm.   Admit to hospitalist.   Curlene Labrum, MD [facility] [address] [place], [place] [address] [phone] (office)

## 2019-04-27 NOTE — Telephone Encounter (Signed)
[facility]  Patient getting wound vac changes at the house. Needing pain medication. Needing 1-2 tablets for this now. Also needing some zofran. Home health RN has given me an update. Plans to change the vac today.  Rx sent in.   Curlene Labrum, MD [facility] [address] [place], [place] [address] [phone] (office)

## 2019-04-28 ENCOUNTER — Other Ambulatory Visit: Payer: Self-pay

## 2019-04-28 ENCOUNTER — Encounter (HOSPITAL_COMMUNITY): Payer: Self-pay | Admitting: *Deleted

## 2019-04-28 DIAGNOSIS — I4891 Unspecified atrial fibrillation: Secondary | ICD-10-CM

## 2019-04-28 DIAGNOSIS — T148XXA Other injury of unspecified body region, initial encounter: Secondary | ICD-10-CM

## 2019-04-28 DIAGNOSIS — L089 Local infection of the skin and subcutaneous tissue, unspecified: Secondary | ICD-10-CM

## 2019-04-28 LAB — GLUCOSE, CAPILLARY
Glucose-Capillary: 288 mg/dL — ABNORMAL HIGH (ref 70–99)
Glucose-Capillary: 139 mg/dL — ABNORMAL HIGH (ref 70–99)
Glucose-Capillary: 65 mg/dL — ABNORMAL LOW (ref 70–99)
Glucose-Capillary: 189 mg/dL — ABNORMAL HIGH (ref 70–99)

## 2019-04-28 LAB — CBC
HCT: 33.4 % — ABNORMAL LOW (ref 36.0–46.0)
Hemoglobin: 9.6 g/dL — ABNORMAL LOW (ref 12.0–15.0)
MCH: 25.9 pg — ABNORMAL LOW (ref 26.0–34.0)
MCHC: 28.7 g/dL — ABNORMAL LOW (ref 30.0–36.0)
MCV: 90 fL (ref 80.0–100.0)
Platelets: 463 10*3/uL — ABNORMAL HIGH (ref 150–400)
RBC: 3.71 MIL/uL — ABNORMAL LOW (ref 3.87–5.11)
RDW: 14.6 % (ref 11.5–15.5)
WBC: 11.6 10*3/uL — ABNORMAL HIGH (ref 4.0–10.5)
nRBC: 0 % (ref 0.0–0.2)

## 2019-04-28 LAB — LACTIC ACID, PLASMA: Lactic Acid, Venous: 2.7 mmol/L (ref 0.5–1.9)

## 2019-04-28 LAB — COMPREHENSIVE METABOLIC PANEL
ALT: 13 U/L (ref 0–44)
AST: 14 U/L — ABNORMAL LOW (ref 15–41)
Albumin: 2.3 g/dL — ABNORMAL LOW (ref 3.5–5.0)
Alkaline Phosphatase: 97 U/L (ref 38–126)
Anion gap: 10 (ref 5–15)
BUN: 13 mg/dL (ref 8–23)
CO2: 23 mmol/L (ref 22–32)
Calcium: 7.4 mg/dL — ABNORMAL LOW (ref 8.9–10.3)
Chloride: 101 mmol/L (ref 98–111)
Creatinine, Ser: 0.69 mg/dL (ref 0.44–1.00)
GFR calc Af Amer: >60 mL/min (ref >60)
GFR calc non Af Amer: >60 mL/min (ref >60)
Glucose, Bld: 253 mg/dL — ABNORMAL HIGH (ref 70–99)
Potassium: 3.6 mmol/L (ref 3.5–5.1)
Sodium: 134 mmol/L — ABNORMAL LOW (ref 135–145)
Total Bilirubin: 0.8 mg/dL (ref 0.3–1.2)
Total Protein: 5.6 g/dL — ABNORMAL LOW (ref 6.5–8.1)

## 2019-04-28 LAB — MAGNESIUM: Magnesium: 1.4 mg/dL — ABNORMAL LOW (ref 1.7–2.4)

## 2019-04-28 LAB — SARS CORONAVIRUS 2 BY RT PCR (HOSPITAL ORDER, PERFORMED IN ~~LOC~~ HOSPITAL LAB): SARS Coronavirus 2: NEGATIVE

## 2019-04-28 MED ORDER — DILTIAZEM HCL ER COATED BEADS 120 MG PO CP24
120.0000 mg | ORAL_CAPSULE | Freq: Every day | ORAL | Status: DC
  Administered 2019-04-28 – 2019-04-30 (×3): 120 mg via ORAL
  Filled 2019-04-28 (×3): qty 1

## 2019-04-28 MED ORDER — JUVEN PO PACK
1.0000 | PACK | Freq: Two times a day (BID) | ORAL | Status: DC
  Administered 2019-04-28 – 2019-04-30 (×4): 1 via ORAL
  Filled 2019-04-28 (×4): qty 1

## 2019-04-28 MED ORDER — DILTIAZEM HCL 100 MG IV SOLR
5.0000 mg/h | INTRAVENOUS | Status: DC
  Administered 2019-04-28: 01:00:00 5 mg/h via INTRAVENOUS
  Administered 2019-04-28: 15 mg/h via INTRAVENOUS
  Filled 2019-04-28 (×2): qty 100

## 2019-04-28 MED ORDER — ONDANSETRON HCL 4 MG/2ML IJ SOLN: 4.0000 mg | Freq: Four times a day (QID) | INTRAMUSCULAR | Status: DC | PRN

## 2019-04-28 MED ORDER — ACETAMINOPHEN 650 MG RE SUPP: 650.0000 mg | Freq: Four times a day (QID) | RECTAL | Status: DC | PRN

## 2019-04-28 MED ORDER — APIXABAN 5 MG PO TABS
5.0000 mg | ORAL_TABLET | Freq: Two times a day (BID) | ORAL | Status: DC
  Administered 2019-04-28 – 2019-04-30 (×5): 5 mg via ORAL
  Filled 2019-04-28 (×5): qty 1

## 2019-04-28 MED ORDER — ACETAMINOPHEN 325 MG PO TABS: 650.0000 mg | ORAL_TABLET | Freq: Four times a day (QID) | ORAL | Status: DC | PRN

## 2019-04-28 MED ORDER — CHLORHEXIDINE GLUCONATE CLOTH 2 % EX PADS
6.0000 | MEDICATED_PAD | Freq: Every day | CUTANEOUS | Status: DC
  Administered 2019-04-28 – 2019-04-29 (×2): 6 via TOPICAL

## 2019-04-28 MED ORDER — SODIUM CHLORIDE 0.9 % IV SOLN: INTRAVENOUS | Status: DC

## 2019-04-28 MED ORDER — MAGNESIUM SULFATE 2 GM/50ML IV SOLN
2.0000 g | Freq: Once | INTRAVENOUS | Status: AC
  Administered 2019-04-28: 10:00:00 2 g via INTRAVENOUS
  Filled 2019-04-28: qty 50

## 2019-04-28 MED ORDER — POTASSIUM CHLORIDE CRYS ER 20 MEQ PO TBCR
40.0000 meq | EXTENDED_RELEASE_TABLET | Freq: Once | ORAL | Status: AC
  Administered 2019-04-28: 40 meq via ORAL
  Filled 2019-04-28: qty 2

## 2019-04-28 MED ORDER — SODIUM CHLORIDE 0.9 % IV SOLN
1.0000 g | Freq: Three times a day (TID) | INTRAVENOUS | Status: AC
  Administered 2019-04-28 – 2019-04-29 (×5): 1 g via INTRAVENOUS
  Filled 2019-04-28 (×5): qty 1

## 2019-04-28 MED ORDER — SENNOSIDES-DOCUSATE SODIUM 8.6-50 MG PO TABS: 1.0000 | ORAL_TABLET | Freq: Every evening | ORAL | Status: DC | PRN

## 2019-04-28 MED ORDER — SODIUM CHLORIDE 0.9 % IV SOLN
2.0000 g | Freq: Three times a day (TID) | INTRAVENOUS | Status: DC
  Administered 2019-04-28: 04:00:00 2 g via INTRAVENOUS
  Filled 2019-04-28 (×2): qty 2

## 2019-04-28 MED ORDER — INSULIN ASPART 100 UNIT/ML ~~LOC~~ SOLN
0.0000 [IU] | Freq: Three times a day (TID) | SUBCUTANEOUS | Status: DC
  Administered 2019-04-28: 08:00:00 8 [IU] via SUBCUTANEOUS
  Administered 2019-04-28: 2 [IU] via SUBCUTANEOUS
  Administered 2019-04-29: 3 [IU] via SUBCUTANEOUS
  Administered 2019-04-29: 8 [IU] via SUBCUTANEOUS
  Administered 2019-04-30: 12:00:00 3 [IU] via SUBCUTANEOUS

## 2019-04-28 MED ORDER — SODIUM CHLORIDE 0.9 % IV SOLN
INTRAVENOUS | Status: DC
  Administered 2019-04-28 – 2019-04-30 (×5): via INTRAVENOUS

## 2019-04-28 MED ORDER — TRAMADOL HCL 50 MG PO TABS
50.0000 mg | ORAL_TABLET | Freq: Four times a day (QID) | ORAL | Status: DC | PRN
  Administered 2019-04-28 – 2019-04-29 (×3): 50 mg via ORAL
  Filled 2019-04-28 (×3): qty 1

## 2019-04-28 MED ORDER — OXYCODONE HCL 5 MG PO TABS
5.0000 mg | ORAL_TABLET | Freq: Four times a day (QID) | ORAL | Status: DC | PRN
  Administered 2019-04-28 – 2019-04-30 (×7): 10 mg via ORAL
  Filled 2019-04-28 (×7): qty 2

## 2019-04-28 MED ORDER — ALBUTEROL SULFATE (2.5 MG/3ML) 0.083% IN NEBU: 3.0000 mL | INHALATION_SOLUTION | Freq: Four times a day (QID) | RESPIRATORY_TRACT | Status: DC | PRN

## 2019-04-28 MED ORDER — PANTOPRAZOLE SODIUM 40 MG PO TBEC
40.0000 mg | DELAYED_RELEASE_TABLET | Freq: Every day | ORAL | Status: DC | PRN
  Administered 2019-04-29: 08:00:00 40 mg via ORAL
  Filled 2019-04-28: qty 1

## 2019-04-28 MED ORDER — ESCITALOPRAM OXALATE 10 MG PO TABS
10.0000 mg | ORAL_TABLET | Freq: Every day | ORAL | Status: DC
  Administered 2019-04-28 – 2019-04-30 (×3): 10 mg via ORAL
  Filled 2019-04-28 (×3): qty 1

## 2019-04-28 MED ORDER — ENOXAPARIN SODIUM 40 MG/0.4ML ~~LOC~~ SOLN: 40.0000 mg | SUBCUTANEOUS | Status: DC

## 2019-04-28 MED ORDER — FERROUS SULFATE 325 (65 FE) MG PO TABS
325.0000 mg | ORAL_TABLET | Freq: Two times a day (BID) | ORAL | Status: DC
  Administered 2019-04-28 – 2019-04-30 (×5): 325 mg via ORAL
  Filled 2019-04-28 (×5): qty 1

## 2019-04-28 MED ORDER — BUPROPION HCL ER (XL) 300 MG PO TB24
300.0000 mg | ORAL_TABLET | Freq: Every day | ORAL | Status: DC
  Administered 2019-04-28 – 2019-04-30 (×3): 300 mg via ORAL
  Filled 2019-04-28: qty 2
  Filled 2019-04-28: qty 1
  Filled 2019-04-28: qty 2

## 2019-04-28 MED ORDER — SODIUM CHLORIDE 0.9 % IV SOLN: 3.0000 g | Freq: Four times a day (QID) | INTRAVENOUS | Status: DC

## 2019-04-28 MED ORDER — ONDANSETRON HCL 4 MG PO TABS
4.0000 mg | ORAL_TABLET | Freq: Four times a day (QID) | ORAL | Status: DC | PRN
  Administered 2019-04-28 (×2): 4 mg via ORAL
  Filled 2019-04-28 (×2): qty 1

## 2019-04-28 MED ORDER — METOPROLOL TARTRATE 50 MG PO TABS
50.0000 mg | ORAL_TABLET | Freq: Two times a day (BID) | ORAL | Status: DC
  Administered 2019-04-28 – 2019-04-30 (×6): 50 mg via ORAL
  Filled 2019-04-28 (×6): qty 1

## 2019-04-28 NOTE — Progress Notes (Addendum)
ANTIBIOTIC CONSULT NOTE-Preliminary  Pharmacy Consult for meropenem Indication: wound infection, ESBL ecoli, possible bacteremia  Allergies  Allergen Reactions  . Feraheme [Ferumoxytol] Other (See Comments)    Back pain (yelling out with back pain)  . Propranolol Swelling    Pt states it may have been leg swelling  . Topamax [Topiramate] Other (See Comments)    hallucinations  . Latex Itching and Rash    Patient Measurements: Height: 5' 4"  (162.6 cm) Weight: 202 lb 9.6 oz (91.9 kg) IBW/kg (Calculated) : 54.7 Adjusted Body Weight: \  Vital Signs: Temp: 98.1 F (36.7 C) (09/10 0309) Temp Source: Oral (09/10 0309) BP: 117/47 (09/10 0715) Pulse Rate: 89 (09/10 0715)  Labs: Recent Labs    04/27/19 2027 04/28/19 0425  WBC 14.8* 11.6*  HGB 10.3* 9.6*  PLT 435* 463*  CREATININE 0.76 0.69    Estimated Creatinine Clearance: 74 mL/min (by C-G formula based on SCr of 0.69 mg/dL).  No results for input(s): VANCOTROUGH, VANCOPEAK, VANCORANDOM, GENTTROUGH, GENTPEAK, GENTRANDOM, TOBRATROUGH, TOBRAPEAK, TOBRARND, AMIKACINPEAK, AMIKACINTROU, AMIKACIN in the last 72 hours.   Microbiology: Recent Results (from the past 720 hour(s))  SARS Coronavirus 2 [facility] order, Performed in [facility] hospital lab) Nasopharyngeal Nasopharyngeal Swab     Status: None   Collection Time: 04/23/19 12:03 AM   Specimen: Nasopharyngeal Swab  Result Value Ref Range Status   SARS Coronavirus 2 NEGATIVE NEGATIVE Final    Comment: (NOTE) If result is NEGATIVE SARS-CoV-2 target nucleic acids are NOT DETECTED. The SARS-CoV-2 RNA is generally detectable in upper and lower  respiratory specimens during the acute phase of infection. The lowest  concentration of SARS-CoV-2 viral copies this assay can detect is 250  copies / mL. A negative result does not preclude SARS-CoV-2 infection  and should not be used as the sole basis for treatment or other  patient management decisions.  A negative result may  occur with  improper specimen collection / handling, submission of specimen other  than nasopharyngeal swab, presence of viral mutation(s) within the  areas targeted by this assay, and inadequate number of viral copies  (<250 copies / mL). A negative result must be combined with clinical  observations, patient history, and epidemiological information. If result is POSITIVE SARS-CoV-2 target nucleic acids are DETECTED. The SARS-CoV-2 RNA is generally detectable in upper and lower  respiratory specimens dur ing the acute phase of infection.  Positive  results are indicative of active infection with SARS-CoV-2.  Clinical  correlation with patient history and other diagnostic information is  necessary to determine patient infection status.  Positive results do  not rule out bacterial infection or co-infection with other viruses. If result is PRESUMPTIVE POSTIVE SARS-CoV-2 nucleic acids MAY BE PRESENT.   A presumptive positive result was obtained on the submitted specimen  and confirmed on repeat testing.  While 2019 novel coronavirus  (SARS-CoV-2) nucleic acids may be present in the submitted sample  additional confirmatory testing may be necessary for epidemiological  and / or clinical management purposes  to differentiate between  SARS-CoV-2 and other Sarbecovirus currently known to infect humans.  If clinically indicated additional testing with an alternate test  methodology [phone]) is advised. The SARS-CoV-2 RNA is generally  detectable in upper and lower respiratory sp ecimens during the acute  phase of infection. The expected result is Negative. Fact Sheet for Patients:  StrictlyIdeas.no Fact Sheet for Healthcare Providers: BankingDealers.co.za This test is not yet approved or cleared by the [place] FDA and has been authorized  for detection and/or diagnosis of SARS-CoV-2 by FDA under an Emergency Use Authorization (EUA).  This  EUA will remain in effect (meaning this test can be used) for the duration of the COVID-19 declaration under Section 564(b)(1) of the Act, 21 U.S.C. section 360bbb-3(b)(1), unless the authorization is terminated or revoked sooner. Performed at [facility], [address], [place], [place] [address]   MRSA PCR Screening     Status: Abnormal   Collection Time: 04/23/19  2:56 AM   Specimen: Nasal Mucosa; Nasopharyngeal  Result Value Ref Range Status   MRSA by PCR POSITIVE (A) NEGATIVE Final    Comment:        The GeneXpert MRSA Assay (FDA approved for NASAL specimens only), is one component of a comprehensive MRSA colonization surveillance program. It is not intended to diagnose MRSA infection nor to guide or monitor treatment for MRSA infections. RESULT CALLED TO, READ BACK BY AND VERIFIED WITH: HYLTON @ 1122 ON [id number] BY HENDERSON L. Performed at [facility], [address], [place], [place] [address]   Aerobic/Anaerobic Culture (surgical/deep wound)     Status: None   Collection Time: 04/23/19  1:00 PM   Specimen: Abdomen; Abscess  Result Value Ref Range Status   Specimen Description   Final    ABDOMEN Performed at [facility], [address], [place], [place] [address]    Special Requests   Final    NONE Performed at [name], [address], [place], [place] [address]    Gram Stain   Final    ABUNDANT WBC PRESENT,BOTH PMN AND MONONUCLEAR MODERATE GRAM NEGATIVE RODS FEW GRAM POSITIVE RODS    Culture   Final    MODERATE ESCHERICHIA COLI Confirmed Extended Spectrum Beta-Lactamase Producer (ESBL).  In bloodstream infections from ESBL organisms, carbapenems are preferred over piperacillin/tazobactam. They are shown to have a lower risk of mortality. NO ANAEROBES ISOLATED Performed at [facility], [place] [address], [place], [place] [address]    Report Status 04/27/2019 FINAL  Final   Organism ID, Bacteria ESCHERICHIA COLI  Final      Susceptibility    Escherichia coli - MIC*    AMPICILLIN >=32 RESISTANT Resistant     CEFAZOLIN >=64 RESISTANT Resistant     CEFEPIME RESISTANT Resistant     CEFTAZIDIME RESISTANT Resistant     CEFTRIAXONE >=64 RESISTANT Resistant     CIPROFLOXACIN >=4 RESISTANT Resistant     GENTAMICIN <=1 SENSITIVE Sensitive     IMIPENEM <=0.25 SENSITIVE Sensitive     TRIMETH/SULFA >=320 RESISTANT Resistant     AMPICILLIN/SULBACTAM >=32 RESISTANT Resistant     PIP/TAZO 8 SENSITIVE Sensitive     Extended ESBL POSITIVE Resistant     * MODERATE ESCHERICHIA COLI  SARS Coronavirus 2 [facility] order, Performed in [place] hospital lab) Nasopharyngeal Nasopharyngeal Swab     Status: None   Collection Time: 04/27/19 10:49 PM   Specimen: Nasopharyngeal Swab  Result Value Ref Range Status   SARS Coronavirus 2 NEGATIVE NEGATIVE Final    Comment: (NOTE) If result is NEGATIVE SARS-CoV-2 target nucleic acids are NOT DETECTED. The SARS-CoV-2 RNA is generally detectable in upper and lower  respiratory specimens during the acute phase of infection. The lowest  concentration of SARS-CoV-2 viral copies this assay can detect is 250  copies / mL. A negative result does not preclude SARS-CoV-2 infection  and should not be used as the sole basis for treatment or other  patient management decisions.  A negative result  may occur with  improper specimen collection / handling, submission of specimen other  than nasopharyngeal swab, presence of viral mutation(s) within the  areas targeted by this assay, and inadequate number of viral copies  (<250 copies / mL). A negative result must be combined with clinical  observations, patient history, and epidemiological information. If result is POSITIVE SARS-CoV-2 target nucleic acids are DETECTED. The SARS-CoV-2 RNA is generally detectable in upper and lower  respiratory specimens dur ing the acute phase of infection.  Positive  results are indicative of active infection with SARS-CoV-2.   Clinical  correlation with patient history and other diagnostic information is  necessary to determine patient infection status.  Positive results do  not rule out bacterial infection or co-infection with other viruses. If result is PRESUMPTIVE POSTIVE SARS-CoV-2 nucleic acids MAY BE PRESENT.   A presumptive positive result was obtained on the submitted specimen  and confirmed on repeat testing.  While 2019 novel coronavirus  (SARS-CoV-2) nucleic acids may be present in the submitted sample  additional confirmatory testing may be necessary for epidemiological  and / or clinical management purposes  to differentiate between  SARS-CoV-2 and other Sarbecovirus currently known to infect humans.  If clinically indicated additional testing with an alternate test  methodology [phone]) is advised. The SARS-CoV-2 RNA is generally  detectable in upper and lower respiratory sp ecimens during the acute  phase of infection. The expected result is Negative. Fact Sheet for Patients:  StrictlyIdeas.no Fact Sheet for Healthcare Providers: BankingDealers.co.za This test is not yet approved or cleared by the [place] FDA and has been authorized for detection and/or diagnosis of SARS-CoV-2 by FDA under an Emergency Use Authorization (EUA).  This EUA will remain in effect (meaning this test can be used) for the duration of the COVID-19 declaration under Section 564(b)(1) of the Act, 21 U.S.C. section 360bbb-3(b)(1), unless the authorization is terminated or revoked sooner. Performed at [facility], [address] [place], [place] [address]     Medical History: Past Medical History:  Diagnosis Date  . Atrial fibrillation ([place])   . CHF (congestive heart failure) ([place])   . Depression   . Diabetes mellitus without complication ([place])   . 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 Hypertension   . Morbid obesity ([place])    . Osteoporosis   . Urinary incontinence   . UTI (lower urinary tract infection) 01/2016   Cipro for Klebsiella pneumoniae isolate  . Wheelchair bound    bedbound    Medications:  Anti-infectives (From admission, onward)   Start     Dose/Rate Route Frequency Ordered Stop   04/28/19 1200  meropenem (MERREM) 1 g in sodium chloride 0.9 % 100 mL IVPB     1 g 200 mL/hr over 30 Minutes Intravenous Every 8 hours 04/28/19 0742     04/28/19 0145  meropenem (MERREM) 2 g in sodium chloride 0.9 % 100 mL IVPB  Status:  Discontinued     2 g 200 mL/hr over 30 Minutes Intravenous Every 8 hours 04/28/19 0143 04/28/19 0742   04/28/19 0130  Ampicillin-Sulbactam (UNASYN) 3 g in sodium chloride 0.9 % 100 mL IVPB  Status:  Discontinued     3 g 200 mL/hr over 30 Minutes Intravenous Every 6 hours 04/28/19 0100 04/28/19 0141   04/27/19 2245  amoxicillin-clavulanate (AUGMENTIN) 875-125 MG per tablet 1 tablet     1 tablet Oral  Once 04/27/19 2234 04/27/19 2320  Assessment: 68 yo with chronic abdominal wound.  Per surgeon's note, patient is being readmitted due to possible bacteremia.  She was hypothermic at home (33F).  Patient's wound had previously grown ESBL Ecoli prior to drainage.  Pharmacy has been asked to dose meropenem for wound infection and to cover for ESBL Ecoli.   Merrem 9/10 >>  9/5 abdominal abscess : ESBL e. coli  Plan:  Meropenem 1000 mg IV every 8 hours. Monitor labs, c/s, and patient improvement.  Ramond Craver, RPH 04/28/2019,7:46 AM

## 2019-04-28 NOTE — H&P (Signed)
TRH H&P    Patient Demographics:    [name], is a 68 y.o. female  MRN: [id number]  DOB - 1950-10-11  Admit Date - 04/27/2019  Referring MD/NP/PA: Dr. Roderic Torres  Outpatient Primary MD for the patient is Katrina Cha, MD  Patient coming from: Home  Chief complaint- wound infection   HPI:    [name]  is a 68 y.o. female, with history of morbid obesity, hypertension, diabetes mellitus type 2, depression, atrial fibrillation on Eliquis and Cardizem and CHF presents to the ED today for wound infection.  Patient reports that about 4 weeks ago she had an abscess in her belly that was drained by the surgeon.  She had a wound VAC placed.  Wound VAC she says was taken out today.  They changed it to a wet dry bandage.  1 side of her wound was not healing at the same rate as the other side.  The left side was the side that was not healing correctly.  She had home health nursing other today.  Home health nursing noted that the patient was hypotensive and tachycardic, as well as hypothermic.  She sent patient in to be evaluated at the ED.  Patient reports that she has been nauseous today as well.  Patient was supposed to be started on Augmentin approximately 5 days ago per her report.  She says that [facility] never got the prescription.  So she has been without antibiotics.  ED course  Blood work was done that showed a hyponatremia, hyperglycemia, leukocytosis, COVID negative, and started her on Augmentin.  At time of my interview patient's heart rate was in the 160s.  It was A. fib with RVR.  Patient does have a history of A. fib on Eliquis, Cardizem, metoprolol.  We gave 5 mg IV metoprolol which brought the heart rate down to 117 briefly and then the heart rate increased again.  We started her on Cardizem drip.  She was admitted to the stepdown unit.    Review of systems:    In addition to the HPI above,   Endorses fever-chills, No Headache, No changes with Vision or hearing, No problems swallowing food or Liquids, No Chest pain, Cough  endorses shortness of Breath, Endorses abdominal pain, endorses nausea and vomiting, bowel movements are regular, No Blood in stool or Urine, No dysuria, No new skin rashes or bruises, No new joints pains-aches,  No new weakness, tingling, numbness in any extremity, No recent weight gain or loss, No polyuria, polydypsia or polyphagia, No significant Mental Stressors.  All other systems reviewed and are negative.    Past History of the following :    Past Medical History:  Diagnosis Date  . Atrial fibrillation ([place])   . CHF (congestive heart failure) ([place])   . Depression   . Diabetes mellitus without complication ([place])   . 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 Hypertension   . Morbid obesity ([place])   . Osteoporosis   . Urinary incontinence   . UTI (lower urinary  tract infection) 01/2016   Cipro for Klebsiella pneumoniae isolate  . Wheelchair bound    bedbound      Past Surgical History:  Procedure Laterality Date  . ABDOMINAL HYSTERECTOMY    . APPLICATION OF WOUND VAC  03/09/2019   Procedure: APPLICATION OF WOUND VAC;  Surgeon: Katrina Cagey, MD;  Location: [facility];  Service: General;;  . BIOPSY  09/06/2018   Procedure: BIOPSY;  Surgeon: Katrina Binder, MD;  [facility];  Service: Endoscopy;;  gastric bx's  . CESAREAN SECTION    . CHOLECYSTECTOMY    . ESOPHAGEAL DILATION  09/06/2018   Procedure: ESOPHAGEAL DILATION;  Surgeon: Katrina Binder, MD;  [facility];  Service: Endoscopy;;  . ESOPHAGOGASTRODUODENOSCOPY (EGD) WITH PROPOFOL N/A 09/06/2018   Procedure: ESOPHAGOGASTRODUODENOSCOPY (EGD) WITH PROPOFOL;  Surgeon: Katrina Binder, MD;  [facility];  Service: Endoscopy;  Laterality: N/A;  dilatation  . FEMUR IM NAIL Left 02/20/2016  . FEMUR IM NAIL Left 02/20/2016    Procedure: INTRAMEDULLARY (IM) RETROGRADE FEMORAL NAILING;  Surgeon: Katrina Koyanagi, MD;  Location: [place];  Service: Orthopedics;  Laterality: Left;  . PANCREAS SURGERY  1967   1 cyst excised and one cyst drained  . TEE WITHOUT CARDIOVERSION N/A 04/05/2018   Procedure: TRANSESOPHAGEAL ECHOCARDIOGRAM (TEE);  Surgeon: Katrina Spark, MD;  Location: [place];  Service: Cardiovascular;  Laterality: N/A;  . [place]    . WOUND DEBRIDEMENT N/A 03/09/2019   Procedure: EXCISIONAL DEBRIDEMENT OF ABDOMINAL WOUND ULCERS;  Surgeon: Katrina Cagey, MD;  Location: [facility];  Service: General;  Laterality: N/A;  . WRIST FRACTURE SURGERY        Social History:      Social History   Tobacco Use  . Smoking status: Never Smoker  . Smokeless tobacco: Never Used  Substance Use Topics  . Alcohol use: No       Family History :     Family History  Problem Relation Age of Onset  . Diabetes Mother        died in her 56's of a stroke  . Cancer Mother   . Stroke Mother   . Breast cancer Mother   . Diabetes Father        died in his 86's of a stroke.  . Stroke Father   . Diabetes Brother        died @ 20 of a stroke.  . Stroke Brother   . Diabetes Maternal Grandmother   . Diabetes Maternal Grandfather   . Diabetes Paternal Grandmother   . Diabetes Paternal Grandfather   . Breast cancer Maternal Aunt       Home Medications:   Prior to Admission medications   Medication Sig Start Date End Date Taking? Authorizing Provider  acetaminophen (TYLENOL) 500 MG tablet Take 500-1,000 mg by mouth every 8 (eight) hours as needed for mild pain or headache.    Yes [provider]  albuterol (PROVENTIL HFA;VENTOLIN HFA) 108 (90 Base) MCG/ACT inhaler Inhale 2 puffs into the lungs every 6 (six) hours as needed for wheezing or shortness of breath. Patient taking differently: Inhale 2 puffs into the lungs every 6 (six) hours as needed for wheezing or shortness of breath (As  needed).  08/14/18  Yes Katrina Dike, MD  apixaban (ELIQUIS) 5 MG TABS tablet Take 1 tablet (5 mg total) by mouth 2 (two) times daily. 07/21/18  Yes Katrina Commons, MD  buPROPion [facility]  XL) 300 MG 24 hr tablet Take 300 mg by mouth daily.  01/31/19  Yes [provider]  diltiazem (CARDIZEM CD) 120 MG 24 hr capsule Take 1 capsule (120 mg total) by mouth daily. Patient taking differently: Take 120 mg by mouth daily. Cartia XT 09/10/18  Yes Johnson, Clanford L, MD  escitalopram (LEXAPRO) 10 MG tablet Take 10 mg by mouth daily. 12/28/18  Yes [provider]  ferrous sulfate (FERROUSUL) 325 (65 FE) MG tablet Take 1 tablet (325 mg total) by mouth 2 (two) times daily with a meal. 01/16/19  Yes Domenic Polite, MD  fluticasone [facility]) 50 MCG/ACT nasal spray Place 2 sprays into both nostrils daily as needed for allergies or rhinitis (congestion).    Yes [provider]  furosemide (LASIX) 20 MG tablet Take 2 tablets (40 mg total) by mouth 2 (two) times daily. 03/11/19 04/27/19 Yes Johnson, Clanford L, MD  glipiZIDE (GLUCOTROL) 5 MG tablet Take 1 tablet (5 mg total) by mouth 2 (two) times daily before a meal. 04/25/19  Yes Barton Dubois, MD  levalbuterol Penne Lash) 0.63 MG/3ML nebulizer solution Take 0.63 mg by nebulization every 6 (six) hours as needed for wheezing or shortness of breath.  02/08/19  Yes [provider]  LEVEMIR FLEXTOUCH 100 UNIT/ML Pen Inject 10 Units into the skin 2 (two) times a day. 01/05/19  Yes [provider]  metoprolol tartrate (LOPRESSOR) 50 MG tablet Take 50 mg by mouth 2 (two) times daily.  02/12/19  Yes [provider]  nystatin (MYCOSTATIN/NYSTOP) powder Apply topically daily as needed (rash in skin folds).   Yes [provider]  OVER THE COUNTER MEDICATION Apply 1 application topically daily as needed (rash in skin folds). Baby butt paste   Yes [provider]  pantoprazole (PROTONIX) 40 MG tablet Take 1  tablet (40 mg total) by mouth daily before breakfast. Patient taking differently: Take 40 mg by mouth daily as needed (for GERD/acid reflux).  09/11/18  Yes Johnson, Clanford L, MD  amoxicillin-clavulanate (AUGMENTIN) 875-125 MG tablet Take 1 tablet by mouth every 12 (twelve) hours. Patient not taking: Reported on 04/27/2019 04/25/19   Barton Dubois, MD  nutrition supplement, JUVEN, [facility]) PACK Take 1 packet by mouth 2 (two) times daily between meals. 04/25/19   Barton Dubois, MD  ondansetron (ZOFRAN ODT) 4 MG disintegrating tablet Take 1 tablet (4 mg total) by mouth every 8 (eight) hours as needed for nausea or vomiting. 04/27/19   Katrina Cagey, MD  oxyCODONE (OXY IR/ROXICODONE) 5 MG immediate release tablet Take 1-2 tablets (5-10 mg total) by mouth every 6 (six) hours as needed for severe pain or breakthrough pain (wound vacuum changes). 04/27/19   Katrina Cagey, MD     Allergies:     Allergies  Allergen Reactions  . Feraheme [Ferumoxytol] Other (See Comments)    Back pain (yelling out with back pain)  . Propranolol Swelling    Pt states it may have been leg swelling  . Topamax [Topiramate] Other (See Comments)    hallucinations  . Latex Itching and Rash     Physical Exam:   Vitals  Blood pressure 135/67, pulse (!) 131, temperature 98.2 F (36.8 C), temperature source Oral, resp. rate 16, height 5' 4"  (1.626 m), weight 89.8 kg, SpO2 95 %.  1.  General: Lying supine in bed in no acute distress  2. Psychiatric: Alert and oriented x3, pleasant and cooperative  3. Neurologic: Cranial nerves II through XII are grossly intact with  no focal deficits on exam  4. HEENMT:  Head is atraumatic normocephalic, pupils are reactive to light, neck has no masses, trachea is midline  5. Respiratory : Lungs are clear to auscultation bilaterally  6. Cardiovascular : Heart rate is tachycardic with an irregularly irregular rhythm  7. Gastrointestinal:  Abdomen is soft, nondistended,  nontender.  Clean and dry and intact dressing horizontally across lower abdomen  8. Skin:  No acute changes on limited exam  9.Musculoskeletal:  1+ peripheral edema    Data Review:    CBC Recent Labs  Lab 04/22/19 1847 04/23/19 0337 04/24/19 0418 04/25/19 0458 04/27/19 2027  WBC 13.5* 12.8* 15.2* 15.5* 14.8*  HGB 9.5* 9.3* 8.5* 8.7* 10.3*  HCT 31.9* 31.6* 28.7* 29.2* 37.0  PLT 635* 615* 552* 526* 435*  MCV 87.9 90.8 89.1 87.4 91.4  MCH 26.2 26.7 26.4 26.0 25.4*  MCHC 29.8* 29.4* 29.6* 29.8* 27.8*  RDW 15.1 15.3 15.0 14.6 14.9  LYMPHSABS  --  1.0  --   --  0.6*  MONOABS  --  0.5  --   --  0.8  EOSABS  --  0.0  --   --  0.1  BASOSABS  --  0.0  --   --  0.0   ------------------------------------------------------------------------------------------------------------------  Results for orders placed or performed during the hospital encounter of 04/27/19 (from the past 48 hour(s))  CBC with Differential/Platelet     Status: Abnormal   Collection Time: 04/27/19  8:27 PM  Result Value Ref Range   WBC 14.8 (H) 4.0 - 10.5 K/uL   RBC 4.05 3.87 - 5.11 MIL/uL   Hemoglobin 10.3 (L) 12.0 - 15.0 g/dL   HCT 37.0 36.0 - 46.0 %   MCV 91.4 80.0 - 100.0 fL   MCH 25.4 (L) 26.0 - 34.0 pg   MCHC 27.8 (L) 30.0 - 36.0 g/dL   RDW 14.9 11.5 - 15.5 %   Platelets 435 (H) 150 - 400 K/uL   nRBC 0.0 0.0 - 0.2 %   Neutrophils Relative % 90 %   Neutro Abs 13.2 (H) 1.7 - 7.7 K/uL   Lymphocytes Relative 4 %   Lymphs Abs 0.6 (L) 0.7 - 4.0 K/uL   Monocytes Relative 5 %   Monocytes Absolute 0.8 0.1 - 1.0 K/uL   Eosinophils Relative 0 %   Eosinophils Absolute 0.1 0.0 - 0.5 K/uL   Basophils Relative 0 %   Basophils Absolute 0.0 0.0 - 0.1 K/uL   Immature Granulocytes 1 %   Abs Immature Granulocytes 0.13 (H) 0.00 - 0.07 K/uL    Comment: Performed at [facility], [address] [place], [place] [address]  Comprehensive metabolic panel     Status: Abnormal   Collection Time: 04/27/19  8:27 PM   Result Value Ref Range   Sodium 133 (L) 135 - 145 mmol/L   Potassium 3.5 3.5 - 5.1 mmol/L   Chloride 98 98 - 111 mmol/L   CO2 22 22 - 32 mmol/L   Glucose, Bld 273 (H) 70 - 99 mg/dL   BUN 16 8 - 23 mg/dL   Creatinine, Ser 0.76 0.44 - 1.00 mg/dL   Calcium 7.9 (L) 8.9 - 10.3 mg/dL   Total Protein 5.8 (L) 6.5 - 8.1 g/dL   Albumin 2.3 (L) 3.5 - 5.0 g/dL   AST 12 (L) 15 - 41 U/L   ALT 11 0 - 44 U/L   Alkaline Phosphatase 103 38 - 126 U/L   Total Bilirubin 0.9 0.3 -  1.2 mg/dL   GFR calc non Af Amer >60 >60 mL/min   GFR calc Af Amer >60 >60 mL/min   Anion gap 13 5 - 15    Comment: Performed at [facility], [address] [place], [place] [address]  Lactic acid, plasma     Status: Abnormal   Collection Time: 04/27/19  8:27 PM  Result Value Ref Range   Lactic Acid, Venous 3.1 (HH) 0.5 - 1.9 mmol/L    Comment: CRITICAL RESULT CALLED TO, READ BACK BY AND VERIFIED WITH: WALKER,T ON 04/27/19 AT 2105 BY LOY,C Performed at [facility], [address] [place], [place] [address]   SARS Coronavirus 2 [facility] order, Performed in [facility] hospital lab) Nasopharyngeal Nasopharyngeal Swab     Status: None   Collection Time: 04/27/19 10:49 PM   Specimen: Nasopharyngeal Swab  Result Value Ref Range   SARS Coronavirus 2 NEGATIVE NEGATIVE    Comment: (NOTE) If result is NEGATIVE SARS-CoV-2 target nucleic acids are NOT DETECTED. The SARS-CoV-2 RNA is generally detectable in upper and lower  respiratory specimens during the acute phase of infection. The lowest  concentration of SARS-CoV-2 viral copies this assay can detect is 250  copies / mL. A negative result does not preclude SARS-CoV-2 infection  and should not be used as the sole basis for treatment or other  patient management decisions.  A negative result may occur with  improper specimen collection / handling, submission of specimen other  than nasopharyngeal swab, presence of viral mutation(s) within the  areas targeted by this assay,  and inadequate number of viral copies  (<250 copies / mL). A negative result must be combined with clinical  observations, patient history, and epidemiological information. If result is POSITIVE SARS-CoV-2 target nucleic acids are DETECTED. The SARS-CoV-2 RNA is generally detectable in upper and lower  respiratory specimens dur ing the acute phase of infection.  Positive  results are indicative of active infection with SARS-CoV-2.  Clinical  correlation with patient history and other diagnostic information is  necessary to determine patient infection status.  Positive results do  not rule out bacterial infection or co-infection with other viruses. If result is PRESUMPTIVE POSTIVE SARS-CoV-2 nucleic acids MAY BE PRESENT.   A presumptive positive result was obtained on the submitted specimen  and confirmed on repeat testing.  While 2019 novel coronavirus  (SARS-CoV-2) nucleic acids may be present in the submitted sample  additional confirmatory testing may be necessary for epidemiological  and / or clinical management purposes  to differentiate between  SARS-CoV-2 and other Sarbecovirus currently known to infect humans.  If clinically indicated additional testing with an alternate test  methodology [phone]) is advised. The SARS-CoV-2 RNA is generally  detectable in upper and lower respiratory sp ecimens during the acute  phase of infection. The expected result is Negative. Fact Sheet for Patients:  StrictlyIdeas.no Fact Sheet for Healthcare Providers: BankingDealers.co.za This test is not yet approved or cleared by the [place] FDA and has been authorized for detection and/or diagnosis of SARS-CoV-2 by FDA under an Emergency Use Authorization (EUA).  This EUA will remain in effect (meaning this test can be used) for the duration of the COVID-19 declaration under Section 564(b)(1) of the Act, 21 U.S.C. section 360bbb-3(b)(1), unless  the authorization is terminated or revoked sooner. Performed at [facility], [address] [place],  [address]     [place]  Lab 04/22/19 [phone] 04/23/19 [phone] 04/24/19 0418 04/27/19 2027  NA 133*  133* 131* 133*  K 4.5 4.4 3.5 3.5  CL 101 102 101 98  CO2 19* 21* 23 22  GLUCOSE 348* 306* 96 273*  BUN 22 22 21 16   CREATININE 1.08* 0.99 0.93 0.76  CALCIUM 7.9* 7.8* 7.4* 7.9*  AST 10* 9*  --  12*  ALT 10 11  --  11  ALKPHOS 131* 124  --  103  BILITOT 0.6 0.8  --  0.9   ------------------------------------------------------------------------------------------------------------------  ------------------------------------------------------------------------------------------------------------------ GFR: Estimated Creatinine Clearance: 73 mL/min (by C-G formula based on SCr of 0.76 mg/dL). Liver Function Tests: Recent Labs  Lab 04/22/19 1847 04/23/19 0337 04/27/19 2027  AST 10* 9* 12*  ALT 10 11 11   ALKPHOS 131* 124 103  BILITOT 0.6 0.8 0.9  PROT 5.9* 5.6* 5.8*  ALBUMIN 2.4* 2.4* 2.3*   No results for input(s): LIPASE, AMYLASE in the last 168 hours. No results for input(s): AMMONIA in the last 168 hours. Coagulation Profile: No results for input(s): INR, PROTIME in the last 168 hours. Cardiac Enzymes: No results for input(s): CKTOTAL, CKMB, CKMBINDEX, TROPONINI in the last 168 hours. BNP (last 3 results) No results for input(s): PROBNP in the last 8760 hours. HbA1C: No results for input(s): HGBA1C in the last 72 hours. CBG: Recent Labs  Lab 04/24/19 1714 04/24/19 2117 04/25/19 0731 04/25/19 1108 04/25/19 1549  GLUCAP 297* 160* 239* 152* 81   Lipid Profile: No results for input(s): CHOL, HDL, LDLCALC, TRIG, CHOLHDL, LDLDIRECT in the last 72 hours. Thyroid Function Tests: No results for input(s): TSH, T4TOTAL, FREET4, T3FREE, THYROIDAB in the last 72 hours. Anemia Panel: No results for input(s): VITAMINB12, FOLATE, FERRITIN, TIBC, IRON,  RETICCTPCT in the last 72 hours.  --------------------------------------------------------------------------------------------------------------- Urine analysis:    Component Value Date/Time   COLORURINE YELLOW 03/07/2019 2104   APPEARANCEUR HAZY (A) 03/07/2019 2104   LABSPEC 1.024 03/07/2019 2104   PHURINE 6.0 03/07/2019 2104   GLUCOSEU NEGATIVE 03/07/2019 2104   HGBUR NEGATIVE 03/07/2019 2104   BILIRUBINUR NEGATIVE 03/07/2019 2104   KETONESUR NEGATIVE 03/07/2019 2104   PROTEINUR 100 (A) 03/07/2019 2104   UROBILINOGEN 0.2 12/01/2007 1025   NITRITE NEGATIVE 03/07/2019 2104   LEUKOCYTESUR MODERATE (A) 03/07/2019 2104      Imaging Results:    No results found.  My personal review of EKG: Rhythm A. fib with RVR rate 152 /min, QTc 446,no Acute ST changes   Assessment & Plan:    Active Problems:   Atrial fibrillation with tachycardic ventricular rate (HCC)   1. A. fib with RVR 1. Continue home medications of metoprolol, Cardizem, Eliquis 2. Continue Cardizem drip until heart rate is controlled 3. Monitor on telemetry 2. Hypovolemic hyponatremia 1. Continue fluid hydration with normal saline  2. recheck in the morning 3. Lactic acidosis 1. Recheck now 2. Continue fluid hydration 4. Hyperglycemia in the setting of diabetes mellitus type 2 1. Continue sliding-scale 5. Leukocytosis 1. Secondary to infected abdominal wound 2. See plan below 6. Infected abdominal wound 1. Which are showed ESBL 2. Escalate antibiotics from Augmentin to Merrem per pharmacy consult    DVT Prophylaxis-   SCDs and Eliquis  AM Labs Ordered, also please review Full Orders  Family Communication: Admission, patients condition and plan of care including tests being ordered have been discussed with the patient.  Code Status:  Full  Admission status: Inpatient: Based on patients clinical presentation and evaluation of above clinical data, I have made determination that patient meets Inpatient  criteria at this time.  Time spent in minutes : 68   Clemie General B Zierle-Ghosh M.D on 04/28/2019 at 2:48 AM

## 2019-04-28 NOTE — Progress Notes (Signed)
Lactic acid called by lab at 0528. Value is 2.7  Value is improving compared to previous.

## 2019-04-28 NOTE — ED Notes (Signed)
hospitalist notified that the HR is maintaining above 140 despite IV push med given. New orders placed.

## 2019-04-28 NOTE — TOC Initial Note (Addendum)
Transition of Care [facility]) - Initial/Assessment Note    Patient Details  Name: [name] MRN: [id number] Date of Birth: [birth year]  Transition of Care [facility]) CM/SW Contact:    Shaquandra Galano, Chauncey Reading, RN Phone Number: 04/28/2019, 11:18 AM  Clinical Narrative:    Patient discharged on 04/25/19.      Patient known to [facility] from previous admissions. Patient is from home with spouse.  Patient admitted with Atrial fibrillation with RVR. Has abdominal wound. Patient is active with [facility] for nursing services, has a wound vac, home health RN removed at home prior to patient coming to the hospital. Per husband, [facility] RN was changing wound vac sponges every 3 days.   Patient is non ambulatory and uses a wheelchair, can transfer independently. Husband reports he works during the day and patient is at home alone during the day. He reports he did not get all medications from pharmacy as they were not all ready when he picked them up. He is unclear about what medications she was missing. Apparently per notes, patient was not taking her Augmentin. I did start the conversation with husband of potential nursing home placement to help with wound vac, PT and medication management as it appears patient may need more support than what she has available at home. He reports that he does not feel patient will want to do that.   She does have appear to be active with [facility] per chart review.  Concern for ESBL bacteremia as well as wound infection. Surgery consulted. TOC will follow and address needs for discharge.   Expected Discharge Plan: [place] Barriers to Discharge: Continued Medical Work up   Expected Discharge Plan and Services Expected Discharge Plan: [place]   Discharge Planning Services: CM Consult Post Acute Care Choice: [place], Resumption of Svcs/PTA Provider Living arrangements for the past 2 months: Single Family Home Expected Discharge Date: 04/28/19                          HH Arranged: RN [place] Agency: [place] ([facility])     Representative spoke with at [place]: [name]  Prior Living Arrangements/Services Living arrangements for the past 2 months: Single Family Home Lives with:: Spouse              Current home services: DME, Home RN    Activities of Daily Living Home Assistive Devices/Equipment: CBG Meter, Eyeglasses, Wheelchair, Shower chair with back, Scales, Blood pressure cuff ADL Screening (condition at time of admission) Patient's cognitive ability adequate to safely complete daily activities?: Yes Is the patient deaf or have difficulty hearing?: No Does the patient have difficulty seeing, even when wearing glasses/contacts?: No Does the patient have difficulty concentrating, remembering, or making decisions?: No Patient able to express need for assistance with ADLs?: Yes Does the patient have difficulty dressing or bathing?: No Independently performs ADLs?: Yes (appropriate for developmental age) Does the patient have difficulty walking or climbing stairs?: Yes Weakness of Legs: Both Weakness of Arms/Hands: None   Admission diagnosis:  Weakness [R53.1] Patient Active Problem List   Diagnosis Date Noted  . Atrial fibrillation with tachycardic ventricular rate ([place]) 04/28/2019  . Diabetes ([place]) 04/22/2019  . Pyoderma gangrenosum 03/11/2019  . Wound infection 03/09/2019  . Chronic wound infection of abdomen   . Wheelchair bound   . Abdominal wall skin ulcer ([place]) 03/07/2019  . Acute pulmonary edema ([place]) 02/17/2019  . Iron  deficiency anemia 02/01/2019  . Abdominal wall ulcer, with fat layer exposed ([place]) 01/16/2019  . Elevated troponin   . Nausea and vomiting   . Symptomatic bradycardia 01/10/2019  . DKA (diabetic ketoacidoses) ([place]) 01/10/2019  . Urinary incontinence 12/28/2018  . Recurrent urinary tract infection 12/16/2018  . Wound, open, anterior abdominal wall 12/05/2018  . Hypotension due to  hypovolemia   . Gastritis due to nonsteroidal anti-inflammatory drug   . Esophageal dysphagia   . GI bleed 09/03/2018  . Coagulopathy ([place])   . Colitis   . Lower abdominal pain   . Rectal bleeding   . Dehydration 08/16/2018  . Acute on chronic diastolic CHF (congestive heart failure) ([place]) 08/10/2018  . Acute lower UTI 08/10/2018  . Hypokalemia 08/10/2018  . COPD (chronic obstructive pulmonary disease) ([place]) 08/10/2018  . Thyroid lesion 08/10/2018  . Closed fracture of right ankle 06/10/2018  . Closed fracture of left tibia and fibula with routine healing 04/13/18 06/10/2018  . Atrial fibrillation ([name]) 04/15/2018  . Chronic diastolic HF (heart failure) ([place]) 04/15/2018  . CKD (chronic kidney disease), stage III ([place]) 04/15/2018  . Closed right fibular fracture 04/15/2018  . Left tibial fracture 04/14/2018  . CHF exacerbation ([place]) 04/10/2018  . SOB (shortness of breath)   . Acute CHF (congestive heart failure) ([place]) 04/01/2018  . Atrial fibrillation with RVR ([place]) 04/01/2018  . [place] 03/11/2016  . Muscle spasms of lower extremity 03/04/2016  . Acute renal insufficiency 02/26/2016  . History of MRSA infection 02/26/2016  . HTN (hypertension) 02/19/2016  . Fracture, femur, distal ([place]) 02/19/2016  . Fall 02/19/2016  . Depression 02/19/2016  . Closed left femoral fracture, initial encounter 02/19/2016  . Femur fracture, left ([place]) 02/19/2016   PCP:  [name], MD Pharmacy:   [place], [place] - [address] [place] [address] Phone: [phone] Fax: [phone]  [id number] [id number]: [phone]: [phone]    Social Determinants of Health (SDOH) Interventions    Readmission Risk Interventions Readmission Risk Prevention Plan 04/25/2019 03/11/2019 03/10/2019  Transportation Screening Complete - Complete   Medication Review (Press photographer Complete Complete -  PCP or Specialist appointment within 3-5 days of discharge Not Complete Complete -  PCP/Specialist Appt Not Complete comments office closed due to labor day holiday - -  HRI or Home Care Consult Complete - Complete  SW Recovery Care/Counseling Consult Complete - Complete  Palliative Care Screening Not Applicable - Not [place] Applic[place] - Not Applicable  Some recent data might be hidden

## 2019-04-28 NOTE — Progress Notes (Signed)
[facility]  Patient known to me s/p history of excisional debridement of chronic abdominal wound 7/22 with findings concerning for pyoderma gangrenosum who was sent out on steroids and wound vacuum therapy. Recently admitted with wound infection with purulent pocket that I drained at the bedside. No additional drainage or cellulitis, and replaced wound vacuum. She was sent on Augmentin due to diabetes and steroid use. Cultures did come back ESBL E coli.    Home Health RN called me yesterday when patient was hypothermic to 93 and having chills and not looking good. RN was suppose to change wound vacuum. I asked her to remove vac and do a wet to dry and investigate the wound. She reported no drainage or concerning finding.  I was afraid patient was bacteremic from the E coli, and told her to go the ED. I called the ED yesterday and told them she was coming. She was admitted last night, but unfortunately blood cultures were not obtained.  Today patient has no complains and is up in bed. Feeling better. Was started on meropenem.   BP (!) 114/41   Pulse 65   Temp 98.7 F (37.1 C) (Oral)   Resp 16   Ht 5' 4"  (1.626 m)   Wt 91.9 kg   SpO2 100%   BMI 34.78 kg/m   Abdominal wound with no drainage, cavity open and no drainage, no cellulitis, wound bed healthy and firm.    Assessment: Chronic abdominal wound s/p debridement and pathology consistent with pyoderma gangrenosum. It is not unexpected the issues with infection and healing that we are having given this diagnosis.  Plan: Twice daily saline dampened kerlix to the wound with ABD and paper tape, instructed RN on packing the medial cavity   Patient still needs the dermatology/ medicine follow up regarding longterm steroid use in the setting of pyoderma gangrenosum  Discussed with Dr. Manuella Ghazi, and he ordered blood cultures today. Given the timing, likely will not be positive as she has been on antibiotics. I think we have to  assume she as bacteremic as I believe the Home Health RN about the hypothermia, and I have worked with her extensively with multiple patients. I think Ms. Margolis will need antibiotics as if she had bacteremia regardless of the blood culture results.   Curlene Labrum, MD [facility] [address] [place], [place] [address] [phone] (office)

## 2019-04-28 NOTE — Progress Notes (Signed)
ANTIBIOTIC CONSULT NOTE-Preliminary  Pharmacy Consult for meropenem Indication: wound infection, possible ESBL Ecoli  Allergies  Allergen Reactions  . Feraheme [Ferumoxytol] Other (See Comments)    Back pain (yelling out with back pain)  . Propranolol Swelling    Pt states it may have been leg swelling  . Topamax [Topiramate] Other (See Comments)    hallucinations  . Latex Itching and Rash    Patient Measurements: Height: 5' 4"  (162.6 cm) Weight: 198 lb (89.8 kg) IBW/kg (Calculated) : 54.7 Adjusted Body Weight: \  Vital Signs: Temp: 98.2 F (36.8 C) (09/09 2004) Temp Source: Oral (09/09 2004) BP: 146/80 (09/10 0000) Pulse Rate: 131 (09/10 0015)  Labs: Recent Labs    04/25/19 0458 04/27/19 2027  WBC 15.5* 14.8*  HGB 8.7* 10.3*  PLT 526* 435*  CREATININE  --  0.76    Estimated Creatinine Clearance: 73 mL/min (by C-G formula based on SCr of 0.76 mg/dL).  No results for input(s): VANCOTROUGH, VANCOPEAK, VANCORANDOM, GENTTROUGH, GENTPEAK, GENTRANDOM, TOBRATROUGH, TOBRAPEAK, TOBRARND, AMIKACINPEAK, AMIKACINTROU, AMIKACIN in the last 72 hours.   Microbiology: Recent Results (from the past 720 hour(s))  SARS Coronavirus 2 [facility] order, Performed in [facility] hospital lab) Nasopharyngeal Nasopharyngeal Swab     Status: None   Collection Time: 04/23/19 12:03 AM   Specimen: Nasopharyngeal Swab  Result Value Ref Range Status   SARS Coronavirus 2 NEGATIVE NEGATIVE Final    Comment: (NOTE) If result is NEGATIVE SARS-CoV-2 target nucleic acids are NOT DETECTED. The SARS-CoV-2 RNA is generally detectable in upper and lower  respiratory specimens during the acute phase of infection. The lowest  concentration of SARS-CoV-2 viral copies this assay can detect is 250  copies / mL. A negative result does not preclude SARS-CoV-2 infection  and should not be used as the sole basis for treatment or other  patient management decisions.  A negative result may occur with   improper specimen collection / handling, submission of specimen other  than nasopharyngeal swab, presence of viral mutation(s) within the  areas targeted by this assay, and inadequate number of viral copies  (<250 copies / mL). A negative result must be combined with clinical  observations, patient history, and epidemiological information. If result is POSITIVE SARS-CoV-2 target nucleic acids are DETECTED. The SARS-CoV-2 RNA is generally detectable in upper and lower  respiratory specimens dur ing the acute phase of infection.  Positive  results are indicative of active infection with SARS-CoV-2.  Clinical  correlation with patient history and other diagnostic information is  necessary to determine patient infection status.  Positive results do  not rule out bacterial infection or co-infection with other viruses. If result is PRESUMPTIVE POSTIVE SARS-CoV-2 nucleic acids MAY BE PRESENT.   A presumptive positive result was obtained on the submitted specimen  and confirmed on repeat testing.  While 2019 novel coronavirus  (SARS-CoV-2) nucleic acids may be present in the submitted sample  additional confirmatory testing may be necessary for epidemiological  and / or clinical management purposes  to differentiate between  SARS-CoV-2 and other Sarbecovirus currently known to infect humans.  If clinically indicated additional testing with an alternate test  methodology [phone]) is advised. The SARS-CoV-2 RNA is generally  detectable in upper and lower respiratory sp ecimens during the acute  phase of infection. The expected result is Negative. Fact Sheet for Patients:  StrictlyIdeas.no Fact Sheet for Healthcare Providers: BankingDealers.co.za This test is not yet approved or cleared by the [place] FDA and has been authorized for  detection and/or diagnosis of SARS-CoV-2 by FDA under an Emergency Use Authorization (EUA).  This EUA will  remain in effect (meaning this test can be used) for the duration of the COVID-19 declaration under Section 564(b)(1) of the Act, 21 U.S.C. section 360bbb-3(b)(1), unless the authorization is terminated or revoked sooner. Performed at [facility], [address], [place], [place] [address]   MRSA PCR Screening     Status: Abnormal   Collection Time: 04/23/19  2:56 AM   Specimen: Nasal Mucosa; Nasopharyngeal  Result Value Ref Range Status   MRSA by PCR POSITIVE (A) NEGATIVE Final    Comment:        The GeneXpert MRSA Assay (FDA approved for NASAL specimens only), is one component of a comprehensive MRSA colonization surveillance program. It is not intended to diagnose MRSA infection nor to guide or monitor treatment for MRSA infections. RESULT CALLED TO, READ BACK BY AND VERIFIED WITH: HYLTON @ 1122 ON [id number] BY HENDERSON L. Performed at [facility], [address], [place], [place] [address]   Aerobic/Anaerobic Culture (surgical/deep wound)     Status: None   Collection Time: 04/23/19  1:00 PM   Specimen: Abdomen; Abscess  Result Value Ref Range Status   Specimen Description   Final    ABDOMEN Performed at [facility], [address] [place], [place] [address]    Special Requests   Final    NONE Performed at [facility], [address], [place], [place] [address]    Gram Stain   Final    ABUNDANT WBC PRESENT,BOTH PMN AND MONONUCLEAR MODERATE GRAM NEGATIVE RODS FEW GRAM POSITIVE RODS    Culture   Final    MODERATE ESCHERICHIA COLI Confirmed Extended Spectrum Beta-Lactamase Producer (ESBL).  In bloodstream infections from ESBL organisms, carbapenems are preferred over piperacillin/tazobactam. They are shown to have a lower risk of mortality. NO ANAEROBES ISOLATED Performed at [facility], [place] [address], [place], [place] [address]    Report Status 04/27/2019 FINAL  Final   Organism ID, Bacteria ESCHERICHIA COLI  Final      Susceptibility   Escherichia coli  - MIC*    AMPICILLIN >=32 RESISTANT Resistant     CEFAZOLIN >=64 RESISTANT Resistant     CEFEPIME RESISTANT Resistant     CEFTAZIDIME RESISTANT Resistant     CEFTRIAXONE >=64 RESISTANT Resistant     CIPROFLOXACIN >=4 RESISTANT Resistant     GENTAMICIN <=1 SENSITIVE Sensitive     IMIPENEM <=0.25 SENSITIVE Sensitive     TRIMETH/SULFA >=320 RESISTANT Resistant     AMPICILLIN/SULBACTAM >=32 RESISTANT Resistant     PIP/TAZO 8 SENSITIVE Sensitive     Extended ESBL POSITIVE Resistant     * MODERATE ESCHERICHIA COLI  SARS Coronavirus 2 [facility] order, Performed in [place] hospital lab) Nasopharyngeal Nasopharyngeal Swab     Status: None   Collection Time: 04/27/19 10:49 PM   Specimen: Nasopharyngeal Swab  Result Value Ref Range Status   SARS Coronavirus 2 NEGATIVE NEGATIVE Final    Comment: (NOTE) If result is NEGATIVE SARS-CoV-2 target nucleic acids are NOT DETECTED. The SARS-CoV-2 RNA is generally detectable in upper and lower  respiratory specimens during the acute phase of infection. The lowest  concentration of SARS-CoV-2 viral copies this assay can detect is 250  copies / mL. A negative result does not preclude SARS-CoV-2 infection  and should not be used as the sole basis for treatment or other  patient management decisions.  A negative result may  occur with  improper specimen collection / handling, submission of specimen other  than nasopharyngeal swab, presence of viral mutation(s) within the  areas targeted by this assay, and inadequate number of viral copies  (<250 copies / mL). A negative result must be combined with clinical  observations, patient history, and epidemiological information. If result is POSITIVE SARS-CoV-2 target nucleic acids are DETECTED. The SARS-CoV-2 RNA is generally detectable in upper and lower  respiratory specimens dur ing the acute phase of infection.  Positive  results are indicative of active infection with SARS-CoV-2.  Clinical  correlation  with patient history and other diagnostic information is  necessary to determine patient infection status.  Positive results do  not rule out bacterial infection or co-infection with other viruses. If result is PRESUMPTIVE POSTIVE SARS-CoV-2 nucleic acids MAY BE PRESENT.   A presumptive positive result was obtained on the submitted specimen  and confirmed on repeat testing.  While 2019 novel coronavirus  (SARS-CoV-2) nucleic acids may be present in the submitted sample  additional confirmatory testing may be necessary for epidemiological  and / or clinical management purposes  to differentiate between  SARS-CoV-2 and other Sarbecovirus currently known to infect humans.  If clinically indicated additional testing with an alternate test  methodology [phone]) is advised. The SARS-CoV-2 RNA is generally  detectable in upper and lower respiratory sp ecimens during the acute  phase of infection. The expected result is Negative. Fact Sheet for Patients:  StrictlyIdeas.no Fact Sheet for Healthcare Providers: BankingDealers.co.za This test is not yet approved or cleared by the [place] FDA and has been authorized for detection and/or diagnosis of SARS-CoV-2 by FDA under an Emergency Use Authorization (EUA).  This EUA will remain in effect (meaning this test can be used) for the duration of the COVID-19 declaration under Section 564(b)(1) of the Act, 21 U.S.C. section 360bbb-3(b)(1), unless the authorization is terminated or revoked sooner. Performed at [facility], [address], [place], [place] [address]     Medical History: Past Medical History:  Diagnosis Date  . Atrial fibrillation ([place])   . CHF (congestive heart failure) ([place])   . Depression   . Diabetes mellitus without complication ([place])   . 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 Hypertension   . Morbid obesity ([place])   . Osteoporosis   .  Urinary incontinence   . UTI (lower urinary tract infection) 01/2016   Cipro for Klebsiella pneumoniae isolate  . Wheelchair bound    bedbound    Medications:  Anti-infectives (From admission, onward)   Start     Dose/Rate Route Frequency Ordered Stop   04/28/19 0145  meropenem (MERREM) 2 g in sodium chloride 0.9 % 100 mL IVPB     2 g 200 mL/hr over 30 Minutes Intravenous Every 8 hours 04/28/19 0143     04/28/19 0130  Ampicillin-Sulbactam (UNASYN) 3 g in sodium chloride 0.9 % 100 mL IVPB  Status:  Discontinued     3 g 200 mL/hr over 30 Minutes Intravenous Every 6 hours 04/28/19 0100 04/28/19 0141   04/27/19 2245  amoxicillin-clavulanate (AUGMENTIN) 875-125 MG per tablet 1 tablet     1 tablet Oral  Once 04/27/19 2234 04/27/19 2320      Assessment: 68 yo with chronic abdominal wound.  Per surgeon's note, patient is being readmitted due to possible bacteremia.  She was hypothermic at home (109F).  Patient's wound had previously grown ESBL Ecoli prior to drainage.  Pharmacy has been  asked to dose meropenem for wound infection and to cover for ESBL Ecoli.    Plan:  Preliminary review of pertinent patient information completed.  Protocol will be initiated with dose(s) of meropenem 2 grams Q8 hours.[place] clinical pharmacist will complete review during morning rounds to assess patient and finalize treatment regimen if needed.  Camry Robello Scarlett, RPH 04/28/2019,1:47 AM

## 2019-04-28 NOTE — Progress Notes (Signed)
Per HPI: [name]  is a 68 y.o. female, with history of morbid obesity, hypertension, diabetes mellitus type 2, depression, atrial fibrillation on Eliquis and Cardizem and CHF presents to the ED today for wound infection.  Patient reports that about 4 weeks ago she had an abscess in her belly that was drained by the surgeon.  She had a wound VAC placed.  Wound VAC she says was taken out today.  They changed it to a wet dry bandage.  1 side of her wound was not healing at the same rate as the other side.  The left side was the side that was not healing correctly.  She had home health nursing other today.  Home health nursing noted that the patient was hypotensive and tachycardic, as well as hypothermic.  She sent patient in to be evaluated at the ED.  Patient reports that she has been nauseous today as well.  Patient was supposed to be started on Augmentin approximately 5 days ago per her report.  [facility]  So she has been without antibiotics.  Patient was recently discharged on 9/7 after bedside drainage of her abdominal wound and was supposed to receive oral Augmentin, but unfortunately never received her medication.  She was also noted to have atrial fibrillation with RVR at that time.  Her heart rate is controlled this morning between 80-90 bpm and we will attempt to wean Cardizem drip to off and continue telemetry monitoring.  Concern for ESBL bacteremia as well as wound infection noted for which patient has been started on Merrem.  She has been seen and evaluated at bedside with no issues or concerns otherwise noted this morning.  She denies any palpitations, chest pain, shortness of breath.  Temperature is at 98.5 Fahrenheit with no hypothermia.  Lactic acidosis improving as well as white blood cell count.  Magnesium at 1.4 and will replete this morning and recheck labs in a.m.  Total care time: 30 minutes.

## 2019-04-29 ENCOUNTER — Inpatient Hospital Stay (HOSPITAL_COMMUNITY): Payer: BC Managed Care – PPO

## 2019-04-29 ENCOUNTER — Inpatient Hospital Stay: Payer: Self-pay

## 2019-04-29 LAB — GLUCOSE, CAPILLARY
Glucose-Capillary: 251 mg/dL — ABNORMAL HIGH (ref 70–99)
Glucose-Capillary: 187 mg/dL — ABNORMAL HIGH (ref 70–99)
Glucose-Capillary: 119 mg/dL — ABNORMAL HIGH (ref 70–99)
Glucose-Capillary: 53 mg/dL — ABNORMAL LOW (ref 70–99)
Glucose-Capillary: 74 mg/dL (ref 70–99)

## 2019-04-29 LAB — BASIC METABOLIC PANEL WITH GFR
Anion gap: 10 (ref 5–15)
BUN: 11 mg/dL (ref 8–23)
CO2: 18 mmol/L — ABNORMAL LOW (ref 22–32)
Calcium: 7.2 mg/dL — ABNORMAL LOW (ref 8.9–10.3)
Chloride: 103 mmol/L (ref 98–111)
Creatinine, Ser: 0.75 mg/dL (ref 0.44–1.00)
GFR calc Af Amer: >60 mL/min (ref >60)
GFR calc non Af Amer: >60 mL/min (ref >60)
Glucose, Bld: 263 mg/dL — ABNORMAL HIGH (ref 70–99)
Potassium: 3.9 mmol/L (ref 3.5–5.1)
Sodium: 131 mmol/L — ABNORMAL LOW (ref 135–145)

## 2019-04-29 LAB — MAGNESIUM: Magnesium: 1.6 mg/dL — ABNORMAL LOW (ref 1.7–2.4)

## 2019-04-29 LAB — CBC
HCT: 27.8 % — ABNORMAL LOW (ref 36.0–46.0)
Hemoglobin: 8.1 g/dL — ABNORMAL LOW (ref 12.0–15.0)
MCH: 26 pg (ref 26.0–34.0)
MCHC: 29.1 g/dL — ABNORMAL LOW (ref 30.0–36.0)
MCV: 89.1 fL (ref 80.0–100.0)
Platelets: 418 10*3/uL — ABNORMAL HIGH (ref 150–400)
RBC: 3.12 MIL/uL — ABNORMAL LOW (ref 3.87–5.11)
RDW: 14.8 % (ref 11.5–15.5)
WBC: 11.3 10*3/uL — ABNORMAL HIGH (ref 4.0–10.5)
nRBC: 0 % (ref 0.0–0.2)

## 2019-04-29 LAB — LACTIC ACID, PLASMA: Lactic Acid, Venous: 0.9 mmol/L (ref 0.5–1.9)

## 2019-04-29 MED ORDER — SODIUM CHLORIDE 0.9 % IV SOLN
1.0000 g | INTRAVENOUS | Status: DC
  Administered 2019-04-30: 1000 mg via INTRAVENOUS
  Filled 2019-04-29 (×3): qty 1

## 2019-04-29 MED ORDER — SODIUM CHLORIDE 0.9% FLUSH: 10.0000 mL | INTRAVENOUS | Status: DC | PRN

## 2019-04-29 MED ORDER — SODIUM CHLORIDE 0.9% FLUSH
10.0000 mL | Freq: Two times a day (BID) | INTRAVENOUS | Status: DC
  Administered 2019-04-30: 10 mL

## 2019-04-29 MED ORDER — MAGNESIUM SULFATE 2 GM/50ML IV SOLN
2.0000 g | Freq: Once | INTRAVENOUS | Status: AC
  Administered 2019-04-29: 08:00:00 2 g via INTRAVENOUS
  Filled 2019-04-29: qty 50

## 2019-04-29 MED ORDER — INSULIN DETEMIR 100 UNIT/ML ~~LOC~~ SOLN
10.0000 [IU] | Freq: Two times a day (BID) | SUBCUTANEOUS | Status: DC
  Administered 2019-04-29 – 2019-04-30 (×2): 10 [IU] via SUBCUTANEOUS
  Filled 2019-04-29 (×8): qty 0.1

## 2019-04-29 NOTE — Progress Notes (Signed)
Bedside RN advised that PICC is SVC and good to use.  Requested that right hand PIV be removed and tubings changed prior to connecting to PICC.  Verbalized understanding.  Carolee Rota, RN VAST

## 2019-04-29 NOTE — Progress Notes (Signed)
Peripherally Inserted Central Catheter/Midline Placement  The IV Nurse has discussed with the patient and/or persons authorized to consent for the patient, the purpose of this procedure and the potential benefits and risks involved with this procedure.  The benefits include less needle sticks, lab draws from the catheter, and the patient may be discharged home with the catheter. Risks include, but not limited to, infection, bleeding, blood clot (thrombus formation), and puncture of an artery; nerve damage and irregular heartbeat and possibility to perform a PICC exchange if needed/ordered by physician.  Alternatives to this procedure were also discussed.  Bard Power PICC patient education guide, fact sheet on infection prevention and patient information card has been provided to patient /or left at bedside.    PICC/Midline Placement Documentation  PICC Single Lumen 04/29/19 PICC Right Brachial 36 cm 0 cm (Active)  Indication for Insertion or Continuance of Line Home intravenous therapies (PICC only) 04/29/19 1710  Exposed Catheter (cm) 0 cm 04/29/19 1710  Site Assessment Clean;Dry;Intact 04/29/19 1710  Line Status Flushed;Saline locked;Blood return noted 04/29/19 1710  Dressing Type Transparent 04/29/19 1710  Dressing Status Clean;Dry;Intact 04/29/19 1710  Dressing Intervention New dressing 04/29/19 1710  Dressing Change Due 05/06/19 04/29/19 1710       Quenesha Douglass, Nicolette Bang 04/29/2019, 5:11 PM

## 2019-04-29 NOTE — Progress Notes (Addendum)
PHARMACY CONSULT NOTE FOR:  OUTPATIENT  PARENTERAL ANTIBIOTIC THERAPY (OPAT)  Indication:  abdominal abscess--->ESBL e. coli  Regimen: ertapenem 1g IV q24h  End date: 05/21/2019  IV antibiotic discharge orders are pended. To discharging provider:  please sign these orders via discharge navigator,  Select New Orders & click on the button choice - Manage This Unsigned Work.     Thank you for allowing pharmacy to be a part of this patient's care.  Despina Pole, Pharm. D. Clinical Pharmacist 04/29/2019 2:47 PM

## 2019-04-29 NOTE — Progress Notes (Addendum)
PROGRESS NOTE    [name]  XBD:[id number] DOB: 07/02/1951 DOA: 04/27/2019 PCP: Katrina Cha, MD   Brief Narrative:  Per HPI: [name] a68 y.o.female,with history of morbid obesity, hypertension, diabetes mellitus type 2, depression, atrial fibrillation on Eliquis and Cardizem and CHF presents to the ED today for wound infection. Patient reports that about 4 weeks ago she had an abscess in her belly that was drained by the surgeon. She had a wound VAC placed. Wound VAC she says was taken out today. They changed it to a wet dry bandage. 1 side of her wound was not healing at the same rate as the other side. The left side was the side that was not healing correctly. She had home health nursing other today. Home health nursing noted that the patient was hypotensive and tachycardic, as well as hypothermic. She sent patient in to be evaluated at the ED. Patient reports that she has been nauseous today as well. Patient was supposed to be started on Augmentin approximately 5 days ago per her report. She says that [facility] got the prescription. So she has been without antibiotics.  9/10: Patient was recently discharged on 9/7 after bedside drainage of her abdominal wound and was supposed to receive oral Augmentin, but unfortunately never received her medication.  She was also noted to have atrial fibrillation with RVR at that time.  Her heart rate is controlled this morning between 80-90 bpm and we will attempt to wean Cardizem drip to off and continue telemetry monitoring.  Concern for ESBL bacteremia as well as wound infection noted for which patient has been started on Merrem.  She has been seen and evaluated at bedside with no issues or concerns otherwise noted this morning.  She denies any palpitations, chest pain, shortness of breath.  Temperature is at 98.5 Fahrenheit with no hypothermia.  Lactic acidosis improving as well as white blood cell count.  Magnesium at  1.4 and will replete this morning and recheck labs in a.m.  9/11: Discussed case with ID [name] who agreed that patient should be treated as a case of ESBL bacteremia.  Recommends treatment for the next 3 weeks with IV Merrem/ertapenem.  Also recommending repeat CT abdomen with contrast to ensure that there is no further invasive infection in the abdominal wound area after treatment is complete.  Plan for PICC line placement today and transfer to telemetry as patient is now off Cardizem drip.  Continue to monitor closely through today and anticipate discharge in a.m.  Replete magnesium further.  Assessment & Plan:   Active Problems:   Atrial fibrillation with tachycardic ventricular rate (HCC)   Sepsis secondary to infected abdominal wound with ESBL E. coli and likely associated bacteremia -Unfortunately, blood cultures were not obtained in ED prior to initiation of Merrem -Blood cultures ordered on 9/10 with no growth thus far -Discussed case with ID [name] on 9/11 who recommends 3 weeks of continue treatment with IV antibiotics -Plan to place PICC line today and consult pharmacy for OPAT -Monitor repeat labs in a.m. -No further hypothermia noted -Lactic acid has down trended  Atrial fibrillation with RVR-resolved; likely secondary to above -Currently in atrial fibrillation but with rate between 80-90 bpm -Okay to transfer to telemetry as Cardizem drip has been weaned -Continue home metoprolol, Cardizem, and Eliquis for anticoagulation  Mild hypomagnesemia -Monitor on telemetry -Replete and recheck a.m. labs  Hypovolemic hyponatremia -Continue IV normal saline as ordered, but decrease rate to 75 cc/h -Recheck a.m.  labs  Hyperglycemia in the setting of type 2 diabetes -Continue SSI -Restart home Levemir 10 units twice daily for better control   DVT prophylaxis: Eliquis Code Status: Full Family Communication: None at bedside Disposition Plan: PICC line placement today  with likely transition ertapenem.  Transfer to telemetry monitor for another 24 hours to ensure stability of heart rate.  Appreciate further general surgery recommendations.  Anticipate discharge in the next 24 hours if stable and improved.   Consultants:   General surgery  ID on phone  Procedures:   None  Antimicrobials:  Anti-infectives (From admission, onward)   Start     Dose/Rate Route Frequency Ordered Stop   04/28/19 1200  meropenem (MERREM) 1 g in sodium chloride 0.9 % 100 mL IVPB     1 g 200 mL/hr over 30 Minutes Intravenous Every 8 hours 04/28/19 0742     04/28/19 0145  meropenem (MERREM) 2 g in sodium chloride 0.9 % 100 mL IVPB  Status:  Discontinued     2 g 200 mL/hr over 30 Minutes Intravenous Every 8 hours 04/28/19 0143 04/28/19 0742   04/28/19 0130  Ampicillin-Sulbactam (UNASYN) 3 g in sodium chloride 0.9 % 100 mL IVPB  Status:  Discontinued     3 g 200 mL/hr over 30 Minutes Intravenous Every 6 hours 04/28/19 0100 04/28/19 0141   04/27/19 2245  amoxicillin-clavulanate (AUGMENTIN) 875-125 MG per tablet 1 tablet     1 tablet Oral  Once 04/27/19 2234 04/27/19 2320       Subjective: Patient seen and evaluated today with no new acute complaints or concerns. No acute concerns or events noted overnight.  She is still having some mild nausea and is not tolerating her diet very well.  She has minimal abdominal pain surrounding her wound.  Heart rate has improved and she is off Cardizem drip.  Objective: Vitals:   04/29/19 0400 04/29/19 0500 04/29/19 0600 04/29/19 0700  BP: 122/61 122/61 115/60 103/73  Pulse: (!) 57 62 78 100  Resp: 14 18 18 17   Temp: 98.2 F (36.8 C)     TempSrc: Oral     SpO2: 96% 96% 91% 94%  Weight:  96 kg    Height:        Intake/Output Summary (Last 24 hours) at 04/29/2019 0736 Last data filed at 04/29/2019 0500 Gross per 24 hour  Intake 1280.75 ml  Output 300 ml  Net 980.75 ml   Filed Weights   04/27/19 1751 04/28/19 0309 04/29/19  0500  Weight: 89.8 kg 91.9 kg 96 kg    Examination:  General exam: Appears calm and comfortable  Respiratory system: Clear to auscultation. Respiratory effort normal. Cardiovascular system: S1 & S2 heard, irregular. No JVD, murmurs, rubs, gallops or clicks. No pedal edema. Gastrointestinal system: Abdomen is nondistended, soft and nontender. No organomegaly or masses felt. Normal bowel sounds heard.  Abdominal wound with findings as noted in chart. Central nervous system: Alert and oriented. No focal neurological deficits. Extremities: Symmetric 5 x 5 power. Skin: No rashes, lesions or ulcers Psychiatry: Judgement and insight appear normal. Mood & affect appropriate.     Data Reviewed: I have personally reviewed following labs and imaging studies  CBC: Recent Labs  Lab 04/23/19 0337 04/24/19 0418 04/25/19 0458 04/27/19 2027 04/28/19 0425 04/29/19 0445  WBC 12.8* 15.2* 15.5* 14.8* 11.6* 11.3*  NEUTROABS 11.3*  --   --  13.2*  --   --   HGB 9.3* 8.5* 8.7* 10.3* 9.6* 8.1*  HCT  31.6* 28.7* 29.2* 37.0 33.4* 27.8*  MCV 90.8 89.1 87.4 91.4 90.0 89.1  PLT 615* 552* 526* 435* 463* 262*   Basic Metabolic Panel: Recent Labs  Lab 04/23/19 0337 04/24/19 0418 04/27/19 2027 04/28/19 0425 04/29/19 0445  NA 133* 131* 133* 134* 131*  K 4.4 3.5 3.5 3.6 3.9  CL 102 101 98 101 103  CO2 21* 23 22 23  18*  GLUCOSE 306* 96 273* 253* 263*  BUN 22 21 16 13 11   CREATININE 0.99 0.93 0.76 0.69 0.75  CALCIUM 7.8* 7.4* 7.9* 7.4* 7.2*  MG  --   --   --  1.4* 1.6*   GFR: Estimated Creatinine Clearance: 75.7 mL/min (by C-G formula based on SCr of 0.75 mg/dL). Liver Function Tests: Recent Labs  Lab 04/22/19 1847 04/23/19 0337 04/27/19 2027 04/28/19 0425  AST 10* 9* 12* 14*  ALT 10 11 11 13   ALKPHOS 131* 124 103 97  BILITOT 0.6 0.8 0.9 0.8  PROT 5.9* 5.6* 5.8* 5.6*  ALBUMIN 2.4* 2.4* 2.3* 2.3*   No results for input(s): LIPASE, AMYLASE in the last 168 hours. No results for input(s):  AMMONIA in the last 168 hours. Coagulation Profile: No results for input(s): INR, PROTIME in the last 168 hours. Cardiac Enzymes: No results for input(s): CKTOTAL, CKMB, CKMBINDEX, TROPONINI in the last 168 hours. BNP (last 3 results) No results for input(s): PROBNP in the last 8760 hours. HbA1C: No results for input(s): HGBA1C in the last 72 hours. CBG: Recent Labs  Lab 04/25/19 1549 04/28/19 0802 04/28/19 1205 04/28/19 1645 04/28/19 2114  GLUCAP 81 288* 139* 65* 189*   Lipid Profile: No results for input(s): CHOL, HDL, LDLCALC, TRIG, CHOLHDL, LDLDIRECT in the last 72 hours. Thyroid Function Tests: No results for input(s): TSH, T4TOTAL, FREET4, T3FREE, THYROIDAB in the last 72 hours. Anemia Panel: No results for input(s): VITAMINB12, FOLATE, FERRITIN, TIBC, IRON, RETICCTPCT in the last 72 hours. Sepsis Labs: Recent Labs  Lab 04/22/19 2055 04/27/19 2027 04/28/19 0425 04/29/19 0445  LATICACIDVEN 1.5 3.1* 2.7* 0.9    Recent Results (from the past 240 hour(s))  SARS Coronavirus 2 [facility] order, Performed in [facility] hospital lab) Nasopharyngeal Nasopharyngeal Swab     Status: None   Collection Time: 04/23/19 12:03 AM   Specimen: Nasopharyngeal Swab  Result Value Ref Range Status   SARS Coronavirus 2 NEGATIVE NEGATIVE Final    Comment: (NOTE) If result is NEGATIVE SARS-CoV-2 target nucleic acids are NOT DETECTED. The SARS-CoV-2 RNA is generally detectable in upper and lower  respiratory specimens during the acute phase of infection. The lowest  concentration of SARS-CoV-2 viral copies this assay can detect is 250  copies / mL. A negative result does not preclude SARS-CoV-2 infection  and should not be used as the sole basis for treatment or other  patient management decisions.  A negative result may occur with  improper specimen collection / handling, submission of specimen other  than nasopharyngeal swab, presence of viral mutation(s) within the  areas targeted  by this assay, and inadequate number of viral copies  (<250 copies / mL). A negative result must be combined with clinical  observations, patient history, and epidemiological information. If result is POSITIVE SARS-CoV-2 target nucleic acids are DETECTED. The SARS-CoV-2 RNA is generally detectable in upper and lower  respiratory specimens dur ing the acute phase of infection.  Positive  results are indicative of active infection with SARS-CoV-2.  Clinical  correlation with patient history and other diagnostic information is  necessary to determine patient infection status.  Positive results do  not rule out bacterial infection or co-infection with other viruses. If result is PRESUMPTIVE POSTIVE SARS-CoV-2 nucleic acids MAY BE PRESENT.   A presumptive positive result was obtained on the submitted specimen  and confirmed on repeat testing.  While 2019 novel coronavirus  (SARS-CoV-2) nucleic acids may be present in the submitted sample  additional confirmatory testing may be necessary for epidemiological  and / or clinical management purposes  to differentiate between  SARS-CoV-2 and other Sarbecovirus currently known to infect humans.  If clinically indicated additional testing with an alternate test  methodology [phone]) is advised. The SARS-CoV-2 RNA is generally  detectable in upper and lower respiratory sp ecimens during the acute  phase of infection. The expected result is Negative. Fact Sheet for Patients:  StrictlyIdeas.no Fact Sheet for Healthcare Providers: BankingDealers.co.za This test is not yet approved or cleared by the [place] FDA and has been authorized for detection and/or diagnosis of SARS-CoV-2 by FDA under an Emergency Use Authorization (EUA).  This EUA will remain in effect (meaning this test can be used) for the duration of the COVID-19 declaration under Section 564(b)(1) of the Act, 21 U.S.C. section  360bbb-3(b)(1), unless the authorization is terminated or revoked sooner. Performed at [facility], [address] [place], [place] [address]   MRSA PCR Screening     Status: Abnormal   Collection Time: 04/23/19  2:56 AM   Specimen: Nasal Mucosa; Nasopharyngeal  Result Value Ref Range Status   MRSA by PCR POSITIVE (A) NEGATIVE Final    Comment:        The GeneXpert MRSA Assay (FDA approved for NASAL specimens only), is one component of a comprehensive MRSA colonization surveillance program. It is not intended to diagnose MRSA infection nor to guide or monitor treatment for MRSA infections. RESULT CALLED TO, READ BACK BY AND VERIFIED WITH: HYLTON @ 1122 ON [id number] BY HENDERSON L. Performed at [facility], [address], [place], [place] [address]   Aerobic/Anaerobic Culture (surgical/deep wound)     Status: None   Collection Time: 04/23/19  1:00 PM   Specimen: Abdomen; Abscess  Result Value Ref Range Status   Specimen Description   Final    ABDOMEN Performed at [facility], [address], [place], [place] [address]    Special Requests   Final    NONE Performed at [facility], [address] [place], [place] [address]    Gram Stain   Final    ABUNDANT WBC PRESENT,BOTH PMN AND MONONUCLEAR MODERATE GRAM NEGATIVE RODS FEW GRAM POSITIVE RODS    Culture   Final    MODERATE ESCHERICHIA COLI Confirmed Extended Spectrum Beta-Lactamase Producer (ESBL).  In bloodstream infections from ESBL organisms, carbapenems are preferred over piperacillin/tazobactam. They are shown to have a lower risk of mortality. NO ANAEROBES ISOLATED Performed at [facility], [place] [address] [place], [place] [address]    Report Status 04/27/2019 FINAL  Final   Organism ID, Bacteria ESCHERICHIA COLI  Final      Susceptibility   Escherichia coli - MIC*    AMPICILLIN >=32 RESISTANT Resistant     CEFAZOLIN >=64 RESISTANT Resistant     CEFEPIME RESISTANT Resistant     CEFTAZIDIME RESISTANT  Resistant     CEFTRIAXONE >=64 RESISTANT Resistant     CIPROFLOXACIN >=4 RESISTANT Resistant     GENTAMICIN <=1 SENSITIVE Sensitive     IMIPENEM <=0.25 SENSITIVE Sensitive  TRIMETH/SULFA >=320 RESISTANT Resistant     AMPICILLIN/SULBACTAM >=32 RESISTANT Resistant     PIP/TAZO 8 SENSITIVE Sensitive     Extended ESBL POSITIVE Resistant     * MODERATE ESCHERICHIA COLI  SARS Coronavirus 2 [facility] order, Performed in [facility] hospital lab) Nasopharyngeal Nasopharyngeal Swab     Status: None   Collection Time: 04/27/19 10:49 PM   Specimen: Nasopharyngeal Swab  Result Value Ref Range Status   SARS Coronavirus 2 NEGATIVE NEGATIVE Final    Comment: (NOTE) If result is NEGATIVE SARS-CoV-2 target nucleic acids are NOT DETECTED. The SARS-CoV-2 RNA is generally detectable in upper and lower  respiratory specimens during the acute phase of infection. The lowest  concentration of SARS-CoV-2 viral copies this assay can detect is 250  copies / mL. A negative result does not preclude SARS-CoV-2 infection  and should not be used as the sole basis for treatment or other  patient management decisions.  A negative result may occur with  improper specimen collection / handling, submission of specimen other  than nasopharyngeal swab, presence of viral mutation(s) within the  areas targeted by this assay, and inadequate number of viral copies  (<250 copies / mL). A negative result must be combined with clinical  observations, patient history, and epidemiological information. If result is POSITIVE SARS-CoV-2 target nucleic acids are DETECTED. The SARS-CoV-2 RNA is generally detectable in upper and lower  respiratory specimens dur ing the acute phase of infection.  Positive  results are indicative of active infection with SARS-CoV-2.  Clinical  correlation with patient history and other diagnostic information is  necessary to determine patient infection status.  Positive results do  not rule out  bacterial infection or co-infection with other viruses. If result is PRESUMPTIVE POSTIVE SARS-CoV-2 nucleic acids MAY BE PRESENT.   A presumptive positive result was obtained on the submitted specimen  and confirmed on repeat testing.  While 2019 novel coronavirus  (SARS-CoV-2) nucleic acids may be present in the submitted sample  additional confirmatory testing may be necessary for epidemiological  and / or clinical management purposes  to differentiate between  SARS-CoV-2 and other Sarbecovirus currently known to infect humans.  If clinically indicated additional testing with an alternate test  methodology [phone]) is advised. The SARS-CoV-2 RNA is generally  detectable in upper and lower respiratory sp ecimens during the acute  phase of infection. The expected result is Negative. Fact Sheet for Patients:  StrictlyIdeas.no Fact Sheet for Healthcare Providers: BankingDealers.co.za This test is not yet approved or cleared by the [place] FDA and has been authorized for detection and/or diagnosis of SARS-CoV-2 by FDA under an Emergency Use Authorization (EUA).  This EUA will remain in effect (meaning this test can be used) for the duration of the COVID-19 declaration under Section 564(b)(1) of the Act, 21 U.S.C. section 360bbb-3(b)(1), unless the authorization is terminated or revoked sooner. Performed at [facility], [address] [place], [place] [address]   Culture, blood (routine x 2)     Status: None (Preliminary result)   Collection Time: 04/28/19  1:01 PM   Specimen: Left Antecubital; Blood  Result Value Ref Range Status   Specimen Description LEFT ANTECUBITAL  Final   Special Requests   Final    BOTTLES DRAWN AEROBIC AND ANAEROBIC Blood Culture results may not be optimal due to an inadequate volume of blood received in culture bottles   Culture   Final    NO GROWTH < 24 HOURS Performed at [facility],  [address] [place], [place] [address]    Report Status PENDING  Incomplete  Culture, blood (routine x 2)     Status: None (Preliminary result)   Collection Time: 04/28/19  1:20 PM   Specimen: BLOOD LEFT FOREARM  Result Value Ref Range Status   Specimen Description BLOOD LEFT FOREARM  Final   Special Requests BOTTLES DRAWN AEROBIC AND ANAEROBIC BCAV  Final   Culture   Final    NO GROWTH < 24 HOURS Performed at [facility], [address] [place], [place] [address]    Report Status PENDING  Incomplete         Radiology Studies: [place] Ekg Site Rite  Result Date: 04/29/2019 If Site Rite image not attached, placement could not be confirmed due to current cardiac rhythm.       Scheduled Meds:  apixaban  5 mg Oral BID   buPROPion  300 mg Oral Daily   Chlorhexidine Gluconate Cloth  6 each Topical Daily   diltiazem  120 mg Oral Daily   escitalopram  10 mg Oral Daily   ferrous sulfate  325 mg Oral BID WC   insulin aspart  0-15 Units Subcutaneous TID WC   insulin detemir  10 Units Subcutaneous BID   metoprolol tartrate  50 mg Oral BID   nutrition supplement (JUVEN)  1 packet Oral BID BM   Continuous Infusions:  sodium chloride 100 mL/hr at 04/29/19 0152   diltiazem (CARDIZEM) infusion Stopped (04/28/19 0941)   magnesium sulfate bolus IVPB     meropenem (MERREM) IV 1 g (04/29/19 0334)     LOS: 1 day    Time spent: 30 minutes    [name] Darleen Crocker, DO [facility] Pager [phone]  If 7PM-7AM, please contact night-coverage [email or web address] Password [facility] 04/29/2019, 7:36 AM

## 2019-04-29 NOTE — TOC Progression Note (Signed)
Transition of Care [name]) - Progression Note    Patient Details  Name: [name] MRN: [id number] Date of Birth: 05-03-1951  [facility] CM/SW Contact  Boneta Lucks, RN Phone Number: 04/29/2019, 2:10 PM  Clinical Narrative:   Plan for patient to discharge over the weekend. Patient will need 3 weeks of IV antibiotics.  Patient has a high risk readmission rate. Spoke with Jenny Reichmann- husband he is fine with her coming home. States his grand daughter stays with her during the day. He plans to retire in a month. She is active with [name] for RN. [name] is aware of weekend discharge.  Patient will get a PICC today. Called Pam with [facility], she called Pharmacy and is working on antibiotic orders, They are pending at present.  Patient has DME needed in the home and husband provides transportation.      Expected Discharge Plan: [place] Barriers to Discharge: Barriers Resolved  Expected Discharge Plan and Services Expected Discharge Plan: [place]   Discharge Planning Services: CM Consult Post Acute Care Choice: Home Health, Resumption of Svcs/PTA Provider Living arrangements for the past 2 months: Single Family Home Expected Discharge Date: 04/28/19                         HH Arranged: IV Antibiotics HH Agency: [place] ([place]) Date [place]: 04/29/19 Time [place]: 1027 Representative spoke with at [place]: [place] (SDOH) Interventions    Readmission Risk Interventions Readmission Risk Prevention Plan 04/29/2019 04/25/2019 03/11/2019  Transportation Screening Complete Complete -  Medication Review Press photographer) Complete Complete Complete  PCP or Specialist appointment within 3-5 days of discharge Not Complete Not Complete Complete  PCP/Specialist Appt Not Complete comments - office closed due to labor day holiday -  [place] or Home  Care Consult Complete Complete -  SW Recovery Care/Counseling Consult Complete Complete -  Palliative Care Screening Not Complete Not Applicable -  [place] Not Complete Not Applicable -  Some recent data might be hidden

## 2019-04-29 NOTE — ED Provider Notes (Signed)
[facility] Provider Note   CSN: [id number] Arrival date & time: 04/27/19  1734     History   Chief Complaint Chief Complaint  Patient presents with  . Weakness    HPI [name] is a 68 y.o. female.     Patient complains of weakness.  She had a wound infection and was seen by her home health nurse who found out she was hypotensive and tachycardic.  She called the general surgeon who suggested come to emergency department  The history is provided by the patient. No language interpreter was used.  Weakness Severity:  Moderate Onset quality:  Sudden Timing:  Constant Progression:  Worsening Chronicity:  Recurrent Context: not alcohol use   Relieved by:  Nothing Worsened by:  Nothing Associated symptoms: no abdominal pain, no chest pain, no cough, no diarrhea, no frequency, no headaches and no seizures     Past Medical History:  Diagnosis Date  . Atrial fibrillation ([place])   . CHF (congestive heart failure) ([place])   . Depression   . Diabetes mellitus without complication ([place])   . 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 Hypertension   . Morbid obesity ([place])   . Osteoporosis   . Urinary incontinence   . UTI (lower urinary tract infection) 01/2016   Cipro for Klebsiella pneumoniae isolate  . Wheelchair bound    bedbound    Patient Active Problem List   Diagnosis Date Noted  . Atrial fibrillation with tachycardic ventricular rate ([place]) 04/28/2019  . Diabetes ([place]) 04/22/2019  . Pyoderma gangrenosum 03/11/2019  . Wound infection 03/09/2019  . Chronic wound infection of abdomen   . Wheelchair bound   . Abdominal wall skin ulcer ([place]) 03/07/2019  . Acute pulmonary edema ([place]) 02/17/2019  . Iron deficiency anemia 02/01/2019  . Abdominal wall ulcer, with fat layer exposed ([place]) 01/16/2019  . Elevated troponin   . Nausea and vomiting   . Symptomatic bradycardia 01/10/2019  . DKA (diabetic ketoacidoses)  ([place]) 01/10/2019  . Urinary incontinence 12/28/2018  . Recurrent urinary tract infection 12/16/2018  . Wound, open, anterior abdominal wall 12/05/2018  . Hypotension due to hypovolemia   . Gastritis due to nonsteroidal anti-inflammatory drug   . Esophageal dysphagia   . GI bleed 09/03/2018  . Coagulopathy ([place])   . Colitis   . Lower abdominal pain   . Rectal bleeding   . Dehydration 08/16/2018  . Acute on chronic diastolic CHF (congestive heart failure) ([place]) 08/10/2018  . Acute lower UTI 08/10/2018  . Hypokalemia 08/10/2018  . COPD (chronic obstructive pulmonary disease) ([place]) 08/10/2018  . Thyroid lesion 08/10/2018  . Closed fracture of right ankle 06/10/2018  . Closed fracture of left tibia and fibula with routine healing 04/13/18 06/10/2018  . Atrial fibrillation ([place]) 04/15/2018  . Chronic diastolic HF (heart failure) ([place]) 04/15/2018  . CKD (chronic kidney disease), stage III ([place]) 04/15/2018  . Closed right fibular fracture 04/15/2018  . Left tibial fracture 04/14/2018  . CHF exacerbation ([place]) 04/10/2018  . SOB (shortness of breath)   . Acute CHF (congestive heart failure) ([place]) 04/01/2018  . Atrial fibrillation with RVR ([place]) 04/01/2018  . [place] 03/11/2016  . Muscle spasms of lower extremity 03/04/2016  . Acute renal insufficiency 02/26/2016  . History of MRSA infection 02/26/2016  . HTN (hypertension) 02/19/2016  . Fracture, femur, distal ([place]) 02/19/2016  . Fall 02/19/2016  . Depression 02/19/2016  . Closed left femoral fracture, initial encounter 02/19/2016  .  Femur fracture, left ([place]) 02/19/2016    Past Surgical History:  Procedure Laterality Date  . ABDOMINAL HYSTERECTOMY    . APPLICATION OF WOUND VAC  03/09/2019   Procedure: APPLICATION OF WOUND VAC;  Surgeon: Virl Cagey, MD;  Location: [facility];  Service: General;;  . BIOPSY  09/06/2018   Procedure: BIOPSY;  Surgeon: Danie Binder, MD;  Location: [facility];  Service: Endoscopy;;  gastric bx's   . CESAREAN SECTION    . CHOLECYSTECTOMY    . ESOPHAGEAL DILATION  09/06/2018   Procedure: ESOPHAGEAL DILATION;  Surgeon: Danie Binder, MD;  Location[facility];  Service: Endoscopy;;  . ESOPHAGOGASTRODUODENOSCOPY (EGD) WITH PROPOFOL N/A 09/06/2018   Procedure: ESOPHAGOGASTRODUODENOSCOPY (EGD) WITH PROPOFOL;  Surgeon: Danie Binder, MD;  [facility];  Service: Endoscopy;  Laterality: N/A;  dilatation  . FEMUR IM NAIL Left 02/20/2016  . FEMUR IM NAIL Left 02/20/2016   Procedure: INTRAMEDULLARY (IM) RETROGRADE FEMORAL NAILING;  Surgeon: Leandrew Koyanagi, MD;  Location: [facility];  Service: Orthopedics;  Laterality: Left;  . PANCREAS SURGERY  1967   1 cyst excised and one cyst drained  . TEE WITHOUT CARDIOVERSION N/A 04/05/2018   Procedure: TRANSESOPHAGEAL ECHOCARDIOGRAM (TEE);  Surgeon: Dorothy Spark, MD;  Location: [facility];  Service: Cardiovascular;  Laterality: N/A;  . [place]    . WOUND DEBRIDEMENT N/A 03/09/2019   Procedure: EXCISIONAL DEBRIDEMENT OF ABDOMINAL WOUND ULCERS;  Surgeon: Virl Cagey, MD;  Location: [facility];  Service: General;  Laterality: N/A;  . WRIST FRACTURE SURGERY       OB History   No obstetric history on file.      Home Medications    Prior to Admission medications   Medication Sig Start Date End Date Taking? Authorizing Provider  acetaminophen (TYLENOL) 500 MG tablet Take 500-1,000 mg by mouth every 8 (eight) hours as needed for mild pain or headache.    Yes [provider]  albuterol (PROVENTIL HFA;VENTOLIN HFA) 108 (90 Base) MCG/ACT inhaler Inhale 2 puffs into the lungs every 6 (six) hours as needed for wheezing or shortness of breath. Patient taking differently: Inhale 2 puffs into the lungs every 6 (six) hours as needed for wheezing or shortness of breath (As needed).  08/14/18  Yes Kathie Dike, MD  apixaban (ELIQUIS) 5 MG TABS tablet Take 1 tablet (5 mg total) by mouth 2 (two) times daily. 07/21/18  Yes  Herminio Commons, MD  buPROPion (WELLBUTRIN XL) 300 MG 24 hr tablet Take 300 mg by mouth daily.  01/31/19  Yes [provider]  diltiazem (CARDIZEM CD) 120 MG 24 hr capsule Take 1 capsule (120 mg total) by mouth daily. Patient taking differently: Take 120 mg by mouth daily. Cartia XT 09/10/18  Yes Johnson, Clanford L, MD  escitalopram (LEXAPRO) 10 MG tablet Take 10 mg by mouth daily. 12/28/18  Yes [provider]  ferrous sulfate (FERROUSUL) 325 (65 FE) MG tablet Take 1 tablet (325 mg total) by mouth 2 (two) times daily with a meal. 01/16/19  Yes Domenic Polite, MD  fluticasone [facility]) 50 MCG/ACT nasal spray Place 2 sprays into both nostrils daily as needed for allergies or rhinitis (congestion).    Yes [provider]  furosemide (LASIX) 20 MG tablet Take 2 tablets (40 mg total) by mouth 2 (two) times daily. 03/11/19 04/27/19 Yes Johnson, Clanford L, MD  glipiZIDE (GLUCOTROL) 5 MG tablet Take 1 tablet (5 mg total) by mouth 2 (  two) times daily before a meal. 04/25/19  Yes Barton Dubois, MD  levalbuterol Penne Lash) 0.63 MG/3ML nebulizer solution Take 0.63 mg by nebulization every 6 (six) hours as needed for wheezing or shortness of breath.  02/08/19  Yes [provider]  LEVEMIR FLEXTOUCH 100 UNIT/ML Pen Inject 10 Units into the skin 2 (two) times a day. 01/05/19  Yes [provider]  metoprolol tartrate (LOPRESSOR) 50 MG tablet Take 50 mg by mouth 2 (two) times daily.  02/12/19  Yes [provider]  nystatin (MYCOSTATIN/NYSTOP) powder Apply topically daily as needed (rash in skin folds).   Yes [provider]  OVER THE COUNTER MEDICATION Apply 1 application topically daily as needed (rash in skin folds). Baby butt paste   Yes [provider]  pantoprazole (PROTONIX) 40 MG tablet Take 1 tablet (40 mg total) by mouth daily before breakfast. Patient taking differently: Take 40 mg by mouth daily as needed (for GERD/acid reflux).  09/11/18   Yes Johnson, Clanford L, MD  amoxicillin-clavulanate (AUGMENTIN) 875-125 MG tablet Take 1 tablet by mouth every 12 (twelve) hours. Patient not taking: Reported on 04/27/2019 04/25/19   Barton Dubois, MD  nutrition supplement, JUVEN, [facility]) PACK Take 1 packet by mouth 2 (two) times daily between meals. 04/25/19   Barton Dubois, MD  ondansetron (ZOFRAN ODT) 4 MG disintegrating tablet Take 1 tablet (4 mg total) by mouth every 8 (eight) hours as needed for nausea or vomiting. 04/27/19   Virl Cagey, MD  oxyCODONE (OXY IR/ROXICODONE) 5 MG immediate release tablet Take 1-2 tablets (5-10 mg total) by mouth every 6 (six) hours as needed for severe pain or breakthrough pain (wound vacuum changes). 04/27/19   Virl Cagey, MD    Family History Family History  Problem Relation Age of Onset  . Diabetes Mother        died in her 57's of a stroke  . Cancer Mother   . Stroke Mother   . Breast cancer Mother   . Diabetes Father        died in his 74's of a stroke.  . Stroke Father   . Diabetes Brother        died @ 71 of a stroke.  . Stroke Brother   . Diabetes Maternal Grandmother   . Diabetes Maternal Grandfather   . Diabetes Paternal Grandmother   . Diabetes Paternal Grandfather   . Breast cancer Maternal Aunt     Social History Social History   Tobacco Use  . Smoking status: Never Smoker  . Smokeless tobacco: Never Used  Substance Use Topics  . Alcohol use: No  . Drug use: No     Allergies   Feraheme [ferumoxytol], Propranolol, Topamax [topiramate], and Latex   Review of Systems Review of Systems  Constitutional: Negative for appetite change and fatigue.  HENT: Negative for congestion, ear discharge and sinus pressure.   Eyes: Negative for discharge.  Respiratory: Negative for cough.   Cardiovascular: Negative for chest pain.  Gastrointestinal: Negative for abdominal pain and diarrhea.  Genitourinary: Negative for frequency and hematuria.  Musculoskeletal: Negative for  back pain.  Skin: Negative for rash.  Neurological: Positive for weakness. Negative for seizures and headaches.  Psychiatric/Behavioral: Negative for hallucinations.     Physical Exam Updated Vital Signs BP 122/62 (BP Location: Left Arm)   Pulse 64   Temp 98.2 F (36.8 C) (Oral)   Resp 18   Ht 5' 4"  (1.626 m)   Wt 96.8 kg  SpO2 100%   BMI 36.63 kg/m   Physical Exam Vitals signs and nursing note reviewed.  Constitutional:      Appearance: She is well-developed.  HENT:     Head: Normocephalic.  Eyes:     General: No scleral icterus.    Conjunctiva/sclera: Conjunctivae normal.  Neck:     Musculoskeletal: Neck supple.     Thyroid: No thyromegaly.  Cardiovascular:     Rate and Rhythm: Rhythm irregular.     Heart sounds: No murmur. No friction rub. No gallop.   Pulmonary:     Breath sounds: No stridor. No wheezing or rales.  Chest:     Chest wall: No tenderness.  Abdominal:     General: There is no distension.     Tenderness: There is no abdominal tenderness. There is no rebound.  Musculoskeletal: Normal range of motion.  Lymphadenopathy:     Cervical: No cervical adenopathy.  Skin:    Findings: No erythema or rash.  Neurological:     Mental Status: She is alert and oriented to person, place, and time.     Motor: No abnormal muscle tone.     Coordination: Coordination normal.  Psychiatric:        Behavior: Behavior normal.      ED Treatments / Results  Labs (all labs ordered are listed, but only abnormal results are displayed) Labs Reviewed  CBC WITH DIFFERENTIAL/PLATELET - Abnormal; Notable for the following components:      Result Value   WBC 14.8 (*)    Hemoglobin 10.3 (*)    MCH 25.4 (*)    MCHC 27.8 (*)    Platelets 435 (*)    Neutro Abs 13.2 (*)    Lymphs Abs 0.6 (*)    Abs Immature Granulocytes 0.13 (*)    All other components within normal limits  COMPREHENSIVE METABOLIC PANEL - Abnormal; Notable for the following components:   Sodium 133  (*)    Glucose, Bld 273 (*)    Calcium 7.9 (*)    Total Protein 5.8 (*)    Albumin 2.3 (*)    AST 12 (*)    All other components within normal limits  LACTIC ACID, PLASMA - Abnormal; Notable for the following components:   Lactic Acid, Venous 3.1 (*)    All other components within normal limits  MAGNESIUM - Abnormal; Notable for the following components:   Magnesium 1.4 (*)    All other components within normal limits  COMPREHENSIVE METABOLIC PANEL - Abnormal; Notable for the following components:   Sodium 134 (*)    Glucose, Bld 253 (*)    Calcium 7.4 (*)    Total Protein 5.6 (*)    Albumin 2.3 (*)    AST 14 (*)    All other components within normal limits  CBC - Abnormal; Notable for the following components:   WBC 11.6 (*)    RBC 3.71 (*)    Hemoglobin 9.6 (*)    HCT 33.4 (*)    MCH 25.9 (*)    MCHC 28.7 (*)    Platelets 463 (*)    All other components within normal limits  LACTIC ACID, PLASMA - Abnormal; Notable for the following components:   Lactic Acid, Venous 2.7 (*)    All other components within normal limits  GLUCOSE, CAPILLARY - Abnormal; Notable for the following components:   Glucose-Capillary 288 (*)    All other components within normal limits  GLUCOSE, CAPILLARY - Abnormal; Notable for the following  components:   Glucose-Capillary 139 (*)    All other components within normal limits  GLUCOSE, CAPILLARY - Abnormal; Notable for the following components:   Glucose-Capillary 65 (*)    All other components within normal limits  MAGNESIUM - Abnormal; Notable for the following components:   Magnesium 1.6 (*)    All other components within normal limits  CBC - Abnormal; Notable for the following components:   WBC 11.3 (*)    RBC 3.12 (*)    Hemoglobin 8.1 (*)    HCT 27.8 (*)    MCHC 29.1 (*)    Platelets 418 (*)    All other components within normal limits  BASIC METABOLIC PANEL - Abnormal; Notable for the following components:   Sodium 131 (*)    CO2  18 (*)    Glucose, Bld 263 (*)    Calcium 7.2 (*)    All other components within normal limits  GLUCOSE, CAPILLARY - Abnormal; Notable for the following components:   Glucose-Capillary 189 (*)    All other components within normal limits  GLUCOSE, CAPILLARY - Abnormal; Notable for the following components:   Glucose-Capillary 251 (*)    All other components within normal limits  GLUCOSE, CAPILLARY - Abnormal; Notable for the following components:   Glucose-Capillary 187 (*)    All other components within normal limits  SARS CORONAVIRUS 2 (HOSPITAL ORDER, [place])  CULTURE, BLOOD (ROUTINE X 2)  CULTURE, BLOOD (ROUTINE X 2)  LACTIC ACID, PLASMA    EKG EKG Interpretation  Date/Time:  Wednesday April 27 2019 22:56:48 EDT Ventricular Rate:  152 PR Interval:    QRS Duration: 79 QT Interval:  280 QTC Calculation: 446 R Axis:   21 Text Interpretation:  Atrial fibrillation with rapid ventricular response Repolarization abnormality, prob rate related When compared with ECG of 04/22/2019, No significant change was found Confirmed by Delora Fuel ([id number]) on 04/27/2019 11:32:53 PM   Radiology [place] Ekg Site Rite  Result Date: 04/29/2019 If Site Rite image not attached, placement could not be confirmed due to current cardiac rhythm.   Procedures Procedures (including critical care time)  Medications Ordered in ED Medications  oxyCODONE (Oxy IR/ROXICODONE) immediate release tablet 5-10 mg (10 mg Oral Given 04/29/19 1032)  diltiazem (CARDIZEM CD) 24 hr capsule 120 mg (120 mg Oral Given 04/29/19 0900)  metoprolol tartrate (LOPRESSOR) tablet 50 mg (50 mg Oral Given 04/29/19 0900)  buPROPion (WELLBUTRIN XL) 24 hr tablet 300 mg (300 mg Oral Given 04/29/19 0900)  escitalopram (LEXAPRO) tablet 10 mg (10 mg Oral Given 04/29/19 0900)  pantoprazole (PROTONIX) EC tablet 40 mg (40 mg Oral Given 04/29/19 0814)  apixaban (ELIQUIS) tablet 5 mg (5 mg Oral Given 04/29/19 0900)   ferrous sulfate tablet 325 mg (325 mg Oral Given 04/29/19 0813)  nutrition supplement (JUVEN) (JUVEN) powder packet 1 packet (1 packet Oral Given 04/29/19 0900)  albuterol (PROVENTIL) (2.5 MG/3ML) 0.083% nebulizer solution 3 mL (has no administration in time range)  diltiazem (CARDIZEM) 100 mg in dextrose 5 % 100 mL (1 mg/mL) infusion (0 mg/hr Intravenous Stopped 04/28/19 0941)  acetaminophen (TYLENOL) tablet 650 mg (has no administration in time range)    Or  acetaminophen (TYLENOL) suppository 650 mg (has no administration in time range)  traMADol (ULTRAM) tablet 50 mg (50 mg Oral Given 04/29/19 0336)  senna-docusate (Senokot-S) tablet 1 tablet (has no administration in time range)  ondansetron (ZOFRAN) tablet 4 mg (4 mg Oral Given 04/28/19 2155)  Or  ondansetron [facility]) injection 4 mg ( Intravenous See Alternative 04/28/19 2155)  insulin aspart (novoLOG) injection 0-15 Units (8 Units Subcutaneous Given 04/29/19 0813)  Chlorhexidine Gluconate Cloth 2 % PADS 6 each (6 each Topical Given 04/29/19 1033)  0.9 %  sodium chloride infusion ( Intravenous Rate/Dose Verify 04/29/19 1010)  meropenem (MERREM) 1 g in sodium chloride 0.9 % 100 mL IVPB (1 g Intravenous New Bag/Given 04/29/19 0334)  insulin detemir (LEVEMIR) injection 10 Units (10 Units Subcutaneous Given 04/29/19 1032)  sodium chloride 0.9 % bolus 1,000 mL (0 mLs Intravenous Stopped 04/28/19 0235)  sodium chloride 0.9 % bolus 1,000 mL (0 mLs Intravenous Stopped 04/27/19 2213)  ondansetron (ZOFRAN) injection 4 mg (4 mg Intravenous Given 04/27/19 1951)  HYDROmorphone (DILAUDID) injection 0.5 mg (0.5 mg Intravenous Given 04/27/19 1952)  amoxicillin-clavulanate (AUGMENTIN) 875-125 MG per tablet 1 tablet (1 tablet Oral Given 04/27/19 2320)  metoprolol tartrate (LOPRESSOR) injection 5 mg (5 mg Intravenous Given 04/27/19 2320)  metoCLOPramide (REGLAN) injection 10 mg (10 mg Intravenous Given 04/28/19 0004)  magnesium sulfate IVPB 2 g 50 mL ( Intravenous Stopped  04/28/19 1036)  potassium chloride SA (K-DUR) CR tablet 40 mEq (40 mEq Oral Given 04/28/19 0933)  magnesium sulfate IVPB 2 g 50 mL ( Intravenous Stopped 04/29/19 0913)     Initial Impression / Assessment and Plan / ED Course  I have reviewed the triage vital signs and the nursing notes.  Pertinent labs & imaging results that were available during my care of the patient were reviewed by me and considered in my medical decision making (see chart for details).        Patient with abdominal wall infection.  She has not been taking her antibiotics since leaving the hospital a few days ago.  Patient also has rapid atrial fib.  She will be admitted to medicine  Final Clinical Impressions(s) / ED Diagnoses   Final diagnoses:  Weakness    ED Discharge Orders         Ordered    Home infusion instructions [facility] May follow [place] Dosing Protocol; May administer Cathflo as needed to maintain patency of vascular access device.; [place] of vascular access device: per [facility] Protocol: 0.9% NaCl pre/post medica...     Pending    meropenem (MERREM) IVPB  Every 8 hours     Pending           Milton Ferguson, MD 04/29/19 1135

## 2019-04-30 LAB — CBC
HCT: 28.7 % — ABNORMAL LOW (ref 36.0–46.0)
Hemoglobin: 8.2 g/dL — ABNORMAL LOW (ref 12.0–15.0)
MCH: 25.9 pg — ABNORMAL LOW (ref 26.0–34.0)
MCHC: 28.6 g/dL — ABNORMAL LOW (ref 30.0–36.0)
MCV: 90.5 fL (ref 80.0–100.0)
Platelets: 416 10*3/uL — ABNORMAL HIGH (ref 150–400)
RBC: 3.17 MIL/uL — ABNORMAL LOW (ref 3.87–5.11)
RDW: 15 % (ref 11.5–15.5)
WBC: 12.3 10*3/uL — ABNORMAL HIGH (ref 4.0–10.5)
nRBC: 0 % (ref 0.0–0.2)

## 2019-04-30 LAB — BASIC METABOLIC PANEL
Anion gap: 6 (ref 5–15)
BUN: 15 mg/dL (ref 8–23)
CO2: 23 mmol/L (ref 22–32)
Calcium: 7.7 mg/dL — ABNORMAL LOW (ref 8.9–10.3)
Chloride: 105 mmol/L (ref 98–111)
Creatinine, Ser: 0.8 mg/dL (ref 0.44–1.00)
GFR calc Af Amer: >60 mL/min (ref >60)
GFR calc non Af Amer: >60 mL/min (ref >60)
Glucose, Bld: 113 mg/dL — ABNORMAL HIGH (ref 70–99)
Potassium: 3.9 mmol/L (ref 3.5–5.1)
Sodium: 134 mmol/L — ABNORMAL LOW (ref 135–145)

## 2019-04-30 LAB — GLUCOSE, CAPILLARY
Glucose-Capillary: 105 mg/dL — ABNORMAL HIGH (ref 70–99)
Glucose-Capillary: 116 mg/dL — ABNORMAL HIGH (ref 70–99)
Glucose-Capillary: 190 mg/dL — ABNORMAL HIGH (ref 70–99)

## 2019-04-30 LAB — MAGNESIUM: Magnesium: 2 mg/dL (ref 1.7–2.4)

## 2019-04-30 MED ORDER — ONDANSETRON HCL 4 MG PO TABS: 4.0000 mg | ORAL_TABLET | Freq: Four times a day (QID) | ORAL | 0 refills | Status: DC | PRN

## 2019-04-30 MED ORDER — ERTAPENEM IV (FOR PTA / DISCHARGE USE ONLY): 1.0000 g | INTRAVENOUS | 0 refills | Status: DC

## 2019-04-30 NOTE — TOC Transition Note (Signed)
Transition of Care [facility]) - CM/SW Discharge Note   Patient Details  Name: [name] MRN: [id number] Date of Birth: 18-Jun-1951  Transition of [facility]) CM/SW Contact:  Latanya Maudlin, RN Phone Number: 04/30/2019, 10:37 AM   Clinical Narrative:  Patient to be discharged per MD order. Orders in place for home health services. Patient is active with [facility] for home health. Patient wishes to resume and uses them for IV abx need as well. [facility] with [facility] has followed as well to educate and coordinate doseage time with nursing. Patient to return home with spouse and daughter.        Barriers to Discharge: Barriers Resolved   Patient Goals and CMS Choice   CMS Medicare.gov Compare Post Acute Care list provided to:: Patient Represenative (must comment) Choice offered to / list presented to : Spouse  Discharge Placement                       Discharge Plan and Services   Discharge Planning Services: CM Consult Post Acute Care Choice: Home Health, Resumption of Svcs/PTA Provider                    HH Arranged: IV Antibiotics HH Agency: [place] ([facility]) Date [place]: 04/29/19 Time [place]: 7253 Representative spoke with at [place]: [place] ([place]) Interventions     Readmission Risk Interventions Readmission Risk Prevention Plan 04/30/2019 04/29/2019 04/25/2019  Transportation Screening Complete Complete Complete  Medication Review Press photographer) Complete Complete Complete  PCP or Specialist appointment within 3-5 days of discharge Complete Not Complete Not Complete  PCP/Specialist Appt Not Complete comments - - office closed due to labor day holiday  [place] or [place] Complete Complete Complete  SW Recovery Care/Counseling Consult Patient refused Complete Complete  Palliative Care Screening Not Applicable Not Complete Not [place] Not  Applicable Not Complete Not Applicable  Some recent data might be hidden

## 2019-04-30 NOTE — Progress Notes (Signed)
Patient had hypoglycemic event during shift.  Hypoglycemic protocol followed and cbg increased.  Patient stable with no complaints during night.

## 2019-04-30 NOTE — Discharge Summary (Addendum)
Physician Discharge Summary  [name] FBP:[id number] DOB: [birth year] DOA: 04/27/2019  PCP: Katrina Cha, MD  Admit date: 04/27/2019  Discharge date: 04/30/2019  Admitted From:Home  Disposition:  Home  Recommendations for Outpatient Follow-up:  1. Follow up with PCP in 1-2 weeks 2. Follow-up with Dr. Constance Torres in 2 weeks to reassess wound 3. Continue home health wound care as prior 4. Continue on ertapenem IV infusions as prescribed for 3 weeks with end date 10/3.  PICC line placed. 5. Will need repeat CT abdomen imaging with contrast to ensure that wound has healed appropriately and infection has resolved after antibiotic treatment is completed 6. Zofran given as needed for nausea and/or vomiting 7. Avoid glipizide at this time until further instructed by PCP.  Patient appears to have some low blood glucose readings on account of poor appetite and some nausea. 8.   Wound care: Twice daily saline dampened kerlix to the wound with ABD and paper tape per [facility].  Home Health: Continue prior home health with home IV infusions  Equipment/Devices: None  Discharge Condition: Stable  CODE STATUS: Full  Diet recommendation: Heart Healthy/carb modified  Brief/Interim Summary: Per HPI: [name] a68 y.o.female,with history of morbid obesity, hypertension, diabetes mellitus type 2, depression, atrial fibrillation on Eliquis and Cardizem and CHF presents to the ED today for wound infection. Patient reports that about 4 weeks ago she had an abscess in her belly that was drained by the surgeon. She had a wound VAC placed. Wound VAC she says was taken out today. They changed it to a wet dry bandage. 1 side of her wound was not healing at the same rate as the other side. The left side was the side that was not healing correctly. She had home health nursing other today. Home health nursing noted that the patient was hypotensive and tachycardic, as well as  hypothermic. She sent patient in to be evaluated at the ED. Patient reports that she has been nauseous today as well. Patient was supposed to be started on Augmentin approximately 5 days ago per her report. She says that [facility] never got the prescription. So she has been without antibiotics.  9/10: Patient was recently discharged on 9/7 after bedside drainage of her abdominal wound and was supposed to receive oral Augmentin, but unfortunately never received her medication. She was also noted to have atrial fibrillation with RVR at that time. Her heart rate is controlled this morning between 80-90 bpm and we will attempt to wean Cardizem drip to off and continue telemetry monitoring. Concern for ESBL bacteremia as well as wound infection noted for which patient has been started on Merrem. She has been seen and evaluated at bedside with no issues or concerns otherwise noted this morning. She denies any palpitations, chest pain, shortness of breath. Temperature is at 98.5 Fahrenheit with no hypothermia. Lactic acidosis improving as well as white blood cell count. Magnesium at 1.4 and will replete this morning and recheck labs in a.m.  9/11: Discussed case with ID [name] who agreed that patient should be treated as a case of ESBL bacteremia.  Recommends treatment for the next 3 weeks with IV Merrem/ertapenem.  Also recommending repeat CT abdomen with contrast to ensure that there is no further invasive infection in the abdominal wound area after treatment is complete.  Plan for PICC line placement today and transfer to telemetry as patient is now off Cardizem drip.  Continue to monitor closely through today and anticipate discharge in  a.m.  Replete magnesium further.  9/12: PICC line placed on 9/11 and patient is tolerating antibiotics.  She will remain on ertapenem for 3 weeks and will require repeat CT imaging to ensure wound stability.  We will follow-up with general surgery outpatient.  She  remains in stable condition with good heart rate control and minimal nausea this morning.  Blood glucose is stable.  Recommendations as noted above.  She is otherwise stable for discharge.  Discharge Diagnoses:  Active Problems:   Atrial fibrillation with tachycardic ventricular rate (HCC)  Principal discharge diagnosis: Sepsis secondary to infected abdominal wound with likely ESBL E. coli bacteremia.  Discharge Instructions  Discharge Instructions    Diet - low sodium heart healthy   Complete by: As directed    Home infusion instructions [facility] May follow [place] Dosing Protocol; May administer Cathflo as needed to maintain patency of vascular access device.; Flushing of vascular access device: per [facility] Protocol: 0.9% NaCl pre/post medica...   Complete by: As directed    Instructions: May follow [place] Dosing Protocol   Instructions: May administer Cathflo as needed to maintain patency of vascular access device.   Instructions: Flushing of vascular access device: per [facility] Protocol: 0.9% NaCl pre/post medication administration and prn patency; Heparin 100 u/ml, 64m for implanted ports and Heparin 10u/ml, 55mfor all other central venous catheters.   Instructions: May follow [facility] Anaphylaxis Protocol for First Dose Administration in the home: 0.9% NaCl at 25-50 ml/hr to maintain IV access for protocol meds. Epinephrine 0.3 ml IV/IM PRN and Benadryl 25-50 IV/IM PRN s/s of anaphylaxis.   Instructions: [facility] Coord[facility] (RN) to assist per patient IV care needs in the home PRN.   Increase activity slowly   Complete by: As directed      Allergies as of 04/30/2019      Reactions   Feraheme [ferumoxytol] Other (See Comments)   Back pain (yelling out with back pain)   Propranolol Swelling   Pt states it may have been leg swelling   Topamax [topiramate] Other (See Comments)   hallucinations   Latex Itching, Rash      Medication List    STOP taking  these medications   amoxicillin-clavulanate 875-125 MG tablet Commonly known as: AUGMENTIN   glipiZIDE 5 MG tablet Commonly known as: GLUCOTROL     TAKE these medications   acetaminophen 500 MG tablet Commonly known as: TYLENOL Take 500-1,000 mg by mouth every 8 (eight) hours as needed for mild pain or headache.   albuterol 108 (90 Base) MCG/ACT inhaler Commonly known as: VENTOLIN HFA Inhale 2 puffs into the lungs every 6 (six) hours as needed for wheezing or shortness of breath. What changed: reasons to take this   apixaban 5 MG Tabs tablet Commonly known as: ELIQUIS Take 1 tablet (5 mg total) by mouth 2 (two) times daily.   buPROPion 300 MG 24 hr tablet Commonly known as: WELLBUTRIN XL Take 300 mg by mouth daily.   diltiazem 120 MG 24 hr capsule Commonly known as: CARDIZEM CD Take 1 capsule (120 mg total) by mouth daily. What changed: additional instructions   ertapenem  IVPB Commonly known as: INVANZ Inject 1 g into the vein daily. Indication:  Abdominal ESBL infection Last Day of Therapy: 21 days Labs - Once weekly:  CBC/D and BMP, Labs - Every other week:  ESR and CRP   escitalopram 10 MG tablet Commonly known as: LEXAPRO Take 10 mg by mouth daily.  ferrous sulfate 325 (65 FE) MG tablet Commonly known as: FerrouSul Take 1 tablet (325 mg total) by mouth 2 (two) times daily with a meal.   fluticasone 50 MCG/ACT nasal spray Commonly known as: FLONASE Place 2 sprays into both nostrils daily as needed for allergies or rhinitis (congestion).   furosemide 20 MG tablet Commonly known as: LASIX Take 2 tablets (40 mg total) by mouth 2 (two) times daily.   levalbuterol 0.63 MG/3ML nebulizer solution Commonly known as: XOPENEX Take 0.63 mg by nebulization every 6 (six) hours as needed for wheezing or shortness of breath.   Levemir FlexTouch 100 UNIT/ML Pen Generic drug: Insulin Detemir Inject 10 Units into the skin 2 (two) times a day.   metoprolol tartrate 50  MG tablet Commonly known as: LOPRESSOR Take 50 mg by mouth 2 (two) times daily.   nutrition supplement (JUVEN) Pack Take 1 packet by mouth 2 (two) times daily between meals.   nystatin powder Commonly known as: MYCOSTATIN/NYSTOP Apply topically daily as needed (rash in skin folds).   ondansetron 4 MG disintegrating tablet Commonly known as: Zofran ODT Take 1 tablet (4 mg total) by mouth every 8 (eight) hours as needed for nausea or vomiting.   ondansetron 4 MG tablet Commonly known as: ZOFRAN Take 1 tablet (4 mg total) by mouth every 6 (six) hours as needed for nausea.   OVER THE COUNTER MEDICATION Apply 1 application topically daily as needed (rash in skin folds). Baby butt paste   oxyCODONE 5 MG immediate release tablet Commonly known as: Oxy IR/ROXICODONE Take 1-2 tablets (5-10 mg total) by mouth every 6 (six) hours as needed for severe pain or breakthrough pain (wound vacuum changes).   pantoprazole 40 MG tablet Commonly known as: PROTONIX Take 1 tablet (40 mg total) by mouth daily before breakfast. What changed:   when to take this  reasons to take this            Home Infusion Instuctions  (From admission, onward)         Start     Ordered   04/30/19 0000  Home infusion instructions [facility] May follow [place] Dosing Protocol; May administer Cathflo as needed to maintain patency of vascular access device.; Flushing of vascular access device: per [facility] Protocol: 0.9% NaCl pre/post medica...    Question Answer Comment  Instructions May follow [place] Dosing Protocol   Instructions May administer Cathflo as needed to maintain patency of vascular access device.   Instructions Flushing of vascular access device: per [facility] Protocol: 0.9% NaCl pre/post medication administration and prn patency; Heparin 100 u/ml, 46m for implanted ports and Heparin 10u/ml, 552mfor all other central venous catheters.   Instructions May follow [facility] Anaphylaxis Protocol  for First Dose Administration in the home: 0.9% NaCl at 25-50 ml/hr to maintain IV access for protocol meds. Epinephrine 0.3 ml IV/IM PRN and Benadryl 25-50 IV/IM PRN s/s of anaphylaxis.   Instructions [facility] Infusion Coordinator (RN) to assist per patient IV care needs in the home PRN.      04/30/19 [id number]       Follow-up Information    VaLeeroy ChaMD Follow up in 1 week(s).   Specialty: Internal Medicine Contact information: [phone]        KoHerminio CommonsMD .   Specialty: Cardiology Contact information: [phone]  Virl Cagey, MD. Schedule an appointment as soon as possible for a visit in 2 week(s).   Specialty: General Surgery Contact information: [address] Linna Hoff [facility] [address] [phone]          Allergies  Allergen Reactions  . Feraheme [Ferumoxytol] Other (See Comments)    Back pain (yelling out with back pain)  . Propranolol Swelling    Pt states it may have been leg swelling  . Topamax [Topiramate] Other (See Comments)    hallucinations  . Latex Itching and Rash    Consultations:  General surgery   Procedures/Studies: Ct Angio Chest Pe W And/or Wo Contrast  Result Date: 04/22/2019 CLINICAL DATA:  Abdominal pain, acute shortness of breath EXAM: CT ANGIOGRAPHY CHEST CT ABDOMEN AND PELVIS WITH CONTRAST TECHNIQUE: Multidetector CT imaging of the chest was performed using the standard protocol during bolus administration of intravenous contrast. Multiplanar CT image reconstructions and MIPs were obtained to evaluate the vascular anatomy. Multidetector CT imaging of the abdomen and pelvis was performed using the standard protocol during bolus administration of intravenous contrast. CONTRAST:  167m OMNIPAQUE IOHEXOL 350 MG/ML SOLN COMPARISON:  CT abdomen pelvis March 07, 2019 FINDINGS: CTA CHEST FINDINGS  Cardiovascular: There is a optimal opacification of the pulmonary arteries. There is no central,segmental, or subsegmental filling defects within the pulmonary arteries. There is mild cardiomegaly. There is dense mitral valve and coronary artery calcifications. Aortic atherosclerotic calcifications are seen. There is normal three-vessel brachiocephalic anatomy without proximal stenosis. Mediastinum/Nodes: No hilar, mediastinal, or axillary adenopathy. Thyroid gland, trachea, and esophagus demonstrate no significant findings. Lungs/Pleura: There is mildly hazy ground-glass opacities seen throughout both lungs. Mild interstitial prominence seen predominantly within the upper lungs. No large airspace consolidation seen. No pleural effusions. Upper Abdomen: No acute abnormalities present in the visualized portions of the upper abdomen. Surgical clips within the gallbladder bed. Musculoskeletal: No chest wall abnormality. No acute or significant osseous findings. Stable slight anterior wedge compression deformity of the T6 vertebral body. Review of the MIP images confirms the above findings. CT ABDOMEN and PELVIS FINDINGS Lower chest: There is mild cardiomegaly. A small hiatal hernia is present. Hepatobiliary: The liver is normal in density without focal abnormality.The main portal vein is patent. The patient is status post cholecystectomy. No biliary ductal dilation. Pancreas: Unremarkable. No pancreatic ductal dilatation or surrounding inflammatory changes. Spleen: Normal in size without focal abnormality. Adrenals/Urinary Tract: Both adrenal glands appear normal. Cortical scarring seen within both kidneys. A small subcentimeter hypodense lesion seen within the upper pole the left kidney. No hydronephrosis. Stomach/Bowel: The stomach, small bowel, and colon are normal in appearance. Scattered colonic diverticula noted without diverticulitis. No inflammatory changes, wall thickening, or obstructive findings.The appendix  is not well seen. Vascular/Lymphatic: There are no enlarged mesenteric, retroperitoneal, or pelvic lymph nodes. Scattered aortic atherosclerotic calcifications are seen without aneurysmal dilatation. Reproductive: The patient is status post hysterectomy. No adnexal masses or collections seen. Again noted is a right adnexal ovarian cyst measuring 4 cm. Other: Along the right lower anterior abdominal wall there is is surgical wound with a small amount of subcutaneous emphysema. There is a new small fluid collection measuring 4.2 cm seen within this area. Subcutaneous fat stranding changes are seen along the anterior abdominal wall, predominantly within the right lower quadrant. Musculoskeletal: No acute or significant osseous findings. Grade 1 anterolisthesis of L4 on L5 is seen. Review of the MIP images confirms the above findings. IMPRESSION: 1. No central, segmental, or subsegmental pulmonary embolism. 2. Mild  cardiomegaly. 3. Dense mitral valve and coronary artery calcifications. 4. Findings suggestive of mild pulmonary edema. 5. Diverticula without diverticulitis 6. Right anterior abdominal wall surgical wound with a adjacent new small fluid collection measuring 4.2 cm in the subcutaneous tissues. This could represent a postoperative seroma, phlegmon, or early superficial abscess. Electronically Signed   By: Prudencio Pair M.D.   On: 04/22/2019 22:50   Ct Abdomen Pelvis W Contrast  Result Date: 04/22/2019 CLINICAL DATA:  Abdominal pain, acute shortness of breath EXAM: CT ANGIOGRAPHY CHEST CT ABDOMEN AND PELVIS WITH CONTRAST TECHNIQUE: Multidetector CT imaging of the chest was performed using the standard protocol during bolus administration of intravenous contrast. Multiplanar CT image reconstructions and MIPs were obtained to evaluate the vascular anatomy. Multidetector CT imaging of the abdomen and pelvis was performed using the standard protocol during bolus administration of intravenous contrast. CONTRAST:   128m OMNIPAQUE IOHEXOL 350 MG/ML SOLN COMPARISON:  CT abdomen pelvis March 07, 2019 FINDINGS: CTA CHEST FINDINGS Cardiovascular: There is a optimal opacification of the pulmonary arteries. There is no central,segmental, or subsegmental filling defects within the pulmonary arteries. There is mild cardiomegaly. There is dense mitral valve and coronary artery calcifications. Aortic atherosclerotic calcifications are seen. There is normal three-vessel brachiocephalic anatomy without proximal stenosis. Mediastinum/Nodes: No hilar, mediastinal, or axillary adenopathy. Thyroid gland, trachea, and esophagus demonstrate no significant findings. Lungs/Pleura: There is mildly hazy ground-glass opacities seen throughout both lungs. Mild interstitial prominence seen predominantly within the upper lungs. No large airspace consolidation seen. No pleural effusions. Upper Abdomen: No acute abnormalities present in the visualized portions of the upper abdomen. Surgical clips within the gallbladder bed. Musculoskeletal: No chest wall abnormality. No acute or significant osseous findings. Stable slight anterior wedge compression deformity of the T6 vertebral body. Review of the MIP images confirms the above findings. CT ABDOMEN and PELVIS FINDINGS Lower chest: There is mild cardiomegaly. A small hiatal hernia is present. Hepatobiliary: The liver is normal in density without focal abnormality.The main portal vein is patent. The patient is status post cholecystectomy. No biliary ductal dilation. Pancreas: Unremarkable. No pancreatic ductal dilatation or surrounding inflammatory changes. Spleen: Normal in size without focal abnormality. Adrenals/Urinary Tract: Both adrenal glands appear normal. Cortical scarring seen within both kidneys. A small subcentimeter hypodense lesion seen within the upper pole the left kidney. No hydronephrosis. Stomach/Bowel: The stomach, small bowel, and colon are normal in appearance. Scattered colonic  diverticula noted without diverticulitis. No inflammatory changes, wall thickening, or obstructive findings.The appendix is not well seen. Vascular/Lymphatic: There are no enlarged mesenteric, retroperitoneal, or pelvic lymph nodes. Scattered aortic atherosclerotic calcifications are seen without aneurysmal dilatation. Reproductive: The patient is status post hysterectomy. No adnexal masses or collections seen. Again noted is a right adnexal ovarian cyst measuring 4 cm. Other: Along the right lower anterior abdominal wall there is is surgical wound with a small amount of subcutaneous emphysema. There is a new small fluid collection measuring 4.2 cm seen within this area. Subcutaneous fat stranding changes are seen along the anterior abdominal wall, predominantly within the right lower quadrant. Musculoskeletal: No acute or significant osseous findings. Grade 1 anterolisthesis of L4 on L5 is seen. Review of the MIP images confirms the above findings. IMPRESSION: 1. No central, segmental, or subsegmental pulmonary embolism. 2. Mild cardiomegaly. 3. Dense mitral valve and coronary artery calcifications. 4. Findings suggestive of mild pulmonary edema. 5. Diverticula without diverticulitis 6. Right anterior abdominal wall surgical wound with a adjacent new small fluid collection measuring 4.2 cm in  the subcutaneous tissues. This could represent a postoperative seroma, phlegmon, or early superficial abscess. Electronically Signed   By: Prudencio Pair M.D.   On: 04/22/2019 22:50   Dg Chest Port 1 View  Result Date: 04/29/2019 CLINICAL DATA:  Post PICC line placement EXAM: PORTABLE CHEST 1 VIEW COMPARISON:  Portable exam 1739 hours compared to 02/17/2019 FINDINGS: RIGHT arm PICC line tip projects over SVC. Enlargement of cardiac silhouette with vascular congestion. Lordotic positioning with slight rotation to the RIGHT. Slight asymmetric opacity of the LEFT hemithorax versus RIGHT may be related to rotation. No definite  infiltrate, pleural effusion or pneumothorax. IMPRESSION: Enlargement of cardiac silhouette. Tip of RIGHT arm PICC line projects over SVC. Electronically Signed   By: Lavonia Dana M.D.   On: 04/29/2019 18:04   [place] Ekg Site Rite  Result Date: 04/29/2019 If Site Rite image not attached, placement could not be confirmed due to current cardiac rhythm.     Discharge Exam: Vitals:   04/29/19 2156 04/30/19 0447  BP: (!) 139/49 137/73  Pulse: 66 66  Resp: 19 18  Temp: 97.9 F (36.6 C) 98.2 F (36.8 C)  SpO2:  99%   Vitals:   04/29/19 1108 04/29/19 1445 04/29/19 2156 04/30/19 0447  BP: 122/62 122/71 (!) 139/49 137/73  Pulse: 64 70 66 66  Resp: 18 18 19 18   Temp: 98.2 F (36.8 C) 97.6 F (36.4 C) 97.9 F (36.6 C) 98.2 F (36.8 C)  TempSrc: Oral Oral  Oral  SpO2: 100% 95%  99%  Weight: 96.8 kg     Height:        General: Pt is alert, awake, not in acute distress Cardiovascular: RRR, S1/S2 +, no rubs, no gallops Respiratory: CTA bilaterally, no wheezing, no rhonchi Abdominal: Soft, NT, ND, bowel sounds + abdominal wound stable with no active drainage or erythema. Extremities: no edema, no cyanosis    The results of significant diagnostics from this hospitalization (including imaging, microbiology, ancillary and laboratory) are listed below for reference.     Microbiology: Recent Results (from the past 240 hour(s))  SARS Coronavirus 2 [facility] order, Performed in [facility] hospital lab) Nasopharyngeal Nasopharyngeal Swab     Status: None   Collection Time: 04/23/19 12:03 AM   Specimen: Nasopharyngeal Swab  Result Value Ref Range Status   SARS Coronavirus 2 NEGATIVE NEGATIVE Final    Comment: (NOTE) If result is NEGATIVE SARS-CoV-2 target nucleic acids are NOT DETECTED. The SARS-CoV-2 RNA is generally detectable in upper and lower  respiratory specimens during the acute phase of infection. The lowest  concentration of SARS-CoV-2 viral copies this assay can detect is 250   copies / mL. A negative result does not preclude SARS-CoV-2 infection  and should not be used as the sole basis for treatment or other  patient management decisions.  A negative result may occur with  improper specimen collection / handling, submission of specimen other  than nasopharyngeal swab, presence of viral mutation(s) within the  areas targeted by this assay, and inadequate number of viral copies  (<250 copies / mL). A negative result must be combined with clinical  observations, patient history, and epidemiological information. If result is POSITIVE SARS-CoV-2 target nucleic acids are DETECTED. The SARS-CoV-2 RNA is generally detectable in upper and lower  respiratory specimens dur ing the acute phase of infection.  Positive  results are indicative of active infection with SARS-CoV-2.  Clinical  correlation with patient history and other diagnostic information is  necessary to determine  patient infection status.  Positive results do  not rule out bacterial infection or co-infection with other viruses. If result is PRESUMPTIVE POSTIVE SARS-CoV-2 nucleic acids MAY BE PRESENT.   A presumptive positive result was obtained on the submitted specimen  and confirmed on repeat testing.  While 2019 novel coronavirus  (SARS-CoV-2) nucleic acids may be present in the submitted sample  additional confirmatory testing may be necessary for epidemiological  and / or clinical management purposes  to differentiate between  SARS-CoV-2 and other Sarbecovirus currently known to infect humans.  If clinically indicated additional testing with an alternate test  methodology [phone]) is advised. The SARS-CoV-2 RNA is generally  detectable in upper and lower respiratory sp ecimens during the acute  phase of infection. The expected result is Negative. Fact Sheet for Patients:  StrictlyIdeas.no Fact Sheet for Healthcare  Providers: BankingDealers.co.za This test is not yet approved or cleared by the [place] FDA and has been authorized for detection and/or diagnosis of SARS-CoV-2 by FDA under an Emergency Use Authorization (EUA).  This EUA will remain in effect (meaning this test can be used) for the duration of the COVID-19 declaration under Section 564(b)(1) of the Act, 21 U.S.C. section 360bbb-3(b)(1), unless the authorization is terminated or revoked sooner. Performed at [facility], [address], [place], [place] [address]   MRSA PCR Screening     Status: Abnormal   Collection Time: 04/23/19  2:56 AM   Specimen: Nasal Mucosa; Nasopharyngeal  Result Value Ref Range Status   MRSA by PCR POSITIVE (A) NEGATIVE Final    Comment:        The GeneXpert MRSA Assay (FDA approved for NASAL specimens only), is one component of a comprehensive MRSA colonization surveillance program. It is not intended to diagnose MRSA infection nor to guide or monitor treatment for MRSA infections. RESULT CALLED TO, READ BACK BY AND VERIFIED WITH: HYLTON @ 1122 ON [id number] BY HENDERSON L. Performed at [facility], [address], [place], [place] [address]   Aerobic/Anaerobic Culture (surgical/deep wound)     Status: None   Collection Time: 04/23/19  1:00 PM   Specimen: Abdomen; Abscess  Result Value Ref Range Status   Specimen Description   Final    ABDOMEN Performed at [facility], [address], [place], [place] [address]    Special Requests   Final    NONE Performed at [facility], [address] [place], [place] [address]    Gram Stain   Final    ABUNDANT WBC PRESENT,BOTH PMN AND MONONUCLEAR MODERATE GRAM NEGATIVE RODS FEW GRAM POSITIVE RODS    Culture   Final    MODERATE ESCHERICHIA COLI Confirmed Extended Spectrum Beta-Lactamase Producer (ESBL).  In bloodstream infections from ESBL organisms, carbapenems are preferred over piperacillin/tazobactam. They are shown to have a  lower risk of mortality. NO ANAEROBES ISOLATED Performed at [facility], [place] [address] [place], [place] [address]    Report Status 04/27/2019 FINAL  Final   Organism ID, Bacteria ESCHERICHIA COLI  Final      Susceptibility   Escherichia coli - MIC*    AMPICILLIN >=32 RESISTANT Resistant     CEFAZOLIN >=64 RESISTANT Resistant     CEFEPIME RESISTANT Resistant     CEFTAZIDIME RESISTANT Resistant     CEFTRIAXONE >=64 RESISTANT Resistant     CIPROFLOXACIN >=4 RESISTANT Resistant     GENTAMICIN <=1 SENSITIVE Sensitive     IMIPENEM <=0.25 SENSITIVE Sensitive     TRIMETH/SULFA >=320 RESISTANT  Resistant     AMPICILLIN/SULBACTAM >=32 RESISTANT Resistant     PIP/TAZO 8 SENSITIVE Sensitive     Extended ESBL POSITIVE Resistant     * MODERATE ESCHERICHIA COLI  SARS Coronavirus 2 [name] order, Performed in [facility] hospital lab) Nasopharyngeal Nasopharyngeal Swab     Status: None   Collection Time: 04/27/19 10:49 PM   Specimen: Nasopharyngeal Swab  Result Value Ref Range Status   SARS Coronavirus 2 NEGATIVE NEGATIVE Final    Comment: (NOTE) If result is NEGATIVE SARS-CoV-2 target nucleic acids are NOT DETECTED. The SARS-CoV-2 RNA is generally detectable in upper and lower  respiratory specimens during the acute phase of infection. The lowest  concentration of SARS-CoV-2 viral copies this assay can detect is 250  copies / mL. A negative result does not preclude SARS-CoV-2 infection  and should not be used as the sole basis for treatment or other  patient management decisions.  A negative result may occur with  improper specimen collection / handling, submission of specimen other  than nasopharyngeal swab, presence of viral mutation(s) within the  areas targeted by this assay, and inadequate number of viral copies  (<250 copies / mL). A negative result must be combined with clinical  observations, patient history, and epidemiological information. If result is  POSITIVE SARS-CoV-2 target nucleic acids are DETECTED. The SARS-CoV-2 RNA is generally detectable in upper and lower  respiratory specimens dur ing the acute phase of infection.  Positive  results are indicative of active infection with SARS-CoV-2.  Clinical  correlation with patient history and other diagnostic information is  necessary to determine patient infection status.  Positive results do  not rule out bacterial infection or co-infection with other viruses. If result is PRESUMPTIVE POSTIVE SARS-CoV-2 nucleic acids MAY BE PRESENT.   A presumptive positive result was obtained on the submitted specimen  and confirmed on repeat testing.  While 2019 novel coronavirus  (SARS-CoV-2) nucleic acids may be present in the submitted sample  additional confirmatory testing may be necessary for epidemiological  and / or clinical management purposes  to differentiate between  SARS-CoV-2 and other Sarbecovirus currently known to infect humans.  If clinically indicated additional testing with an alternate test  methodology [phone]) is advised. The SARS-CoV-2 RNA is generally  detectable in upper and lower respiratory sp ecimens during the acute  phase of infection. The expected result is Negative. Fact Sheet for Patients:  StrictlyIdeas.no Fact Sheet for Healthcare Providers: BankingDealers.co.za This test is not yet approved or cleared by the [place] FDA and has been authorized for detection and/or diagnosis of SARS-CoV-2 by FDA under an Emergency Use Authorization (EUA).  This EUA will remain in effect (meaning this test can be used) for the duration of the COVID-19 declaration under Section 564(b)(1) of the Act, 21 U.S.C. section 360bbb-3(b)(1), unless the authorization is terminated or revoked sooner. Performed at [facility], [address], [place], [place] [address]   Culture, blood (routine x 2)     Status: None (Preliminary  result)   Collection Time: 04/28/19  1:01 PM   Specimen: Left Antecubital; Blood  Result Value Ref Range Status   Specimen Description LEFT ANTECUBITAL  Final   Special Requests   Final    BOTTLES DRAWN AEROBIC AND ANAEROBIC Blood Culture results may not be optimal due to an inadequate volume of blood received in culture bottles   Culture   Final    NO GROWTH 2 DAYS Performed at [facility], [address],  [place], [place] [address]    Report Status PENDING  Incomplete  Culture, blood (routine x 2)     Status: None (Preliminary result)   Collection Time: 04/28/19  1:20 PM   Specimen: BLOOD LEFT FOREARM  Result Value Ref Range Status   Specimen Description BLOOD LEFT FOREARM  Final   Special Requests BOTTLES DRAWN AEROBIC AND ANAEROBIC BCAV  Final   Culture   Final    NO GROWTH 2 DAYS Performed at [facility], [address], [place], [place] [address]    Report Status PENDING  Incomplete     Labs: BNP (last 3 results) Recent Labs    08/16/18 1848 01/10/19 1646 02/17/19 1522  BNP 464.0* 2,107.0* 7,017.7*   Basic Metabolic Panel: Recent Labs  Lab 04/24/19 0418 04/27/19 2027 04/28/19 0425 04/29/19 0445 04/30/19 0654  NA 131* 133* 134* 131* 134*  K 3.5 3.5 3.6 3.9 3.9  CL 101 98 101 103 105  CO2 23 22 23  18* 23  GLUCOSE 96 273* 253* 263* 113*  BUN 21 16 13 11 15   CREATININE 0.93 0.76 0.69 0.75 0.80  CALCIUM 7.4* 7.9* 7.4* 7.2* 7.7*  MG  --   --  1.4* 1.6* 2.0   Liver Function Tests: Recent Labs  Lab 04/27/19 2027 04/28/19 0425  AST 12* 14*  ALT 11 13  ALKPHOS 103 97  BILITOT 0.9 0.8  PROT 5.8* 5.6*  ALBUMIN 2.3* 2.3*   No results for input(s): LIPASE, AMYLASE in the last 168 hours. No results for input(s): AMMONIA in the last 168 hours. CBC: Recent Labs  Lab 04/25/19 0458 04/27/19 2027 04/28/19 0425 04/29/19 0445 04/30/19 0654  WBC 15.5* 14.8* 11.6* 11.3* 12.3*  NEUTROABS  --  13.2*  --   --   --   HGB 8.7* 10.3* 9.6* 8.1* 8.2*  HCT 29.2* 37.0  33.4* 27.8* 28.7*  MCV 87.4 91.4 90.0 89.1 90.5  PLT 526* 435* 463* 418* 416*   Cardiac Enzymes: No results for input(s): CKTOTAL, CKMB, CKMBINDEX, TROPONINI in the last 168 hours. BNP: Invalid input(s): POCBNP CBG: Recent Labs  Lab 04/29/19 1604 04/29/19 2157 04/29/19 2316 04/30/19 0136 04/30/19 0722  GLUCAP 119* 53* 74 105* 116*   D-Dimer No results for input(s): DDIMER in the last 72 hours. Hgb A1c No results for input(s): HGBA1C in the last 72 hours. Lipid Profile No results for input(s): CHOL, HDL, LDLCALC, TRIG, CHOLHDL, LDLDIRECT in the last 72 hours. Thyroid function studies No results for input(s): TSH, T4TOTAL, T3FREE, THYROIDAB in the last 72 hours.  Invalid input(s): FREET3 Anemia work up No results for input(s): VITAMINB12, FOLATE, FERRITIN, TIBC, IRON, RETICCTPCT in the last 72 hours. Urinalysis    Component Value Date/Time   COLORURINE YELLOW 03/07/2019 2104   APPEARANCEUR HAZY (A) 03/07/2019 2104   LABSPEC 1.024 03/07/2019 2104   PHURINE 6.0 03/07/2019 2104   GLUCOSEU NEGATIVE 03/07/2019 2104   HGBUR NEGATIVE 03/07/2019 2104   BILIRUBINUR NEGATIVE 03/07/2019 2104   [place] NEGATIVE 03/07/2019 2104   PROTEINUR 100 (A) 03/07/2019 2104   UROBILINOGEN 0.2 12/01/2007 1025   NITRITE NEGATIVE 03/07/2019 2104   LEUKOCYTESUR MODERATE (A) 03/07/2019 2104   Sepsis Labs Invalid input(s): PROCALCITONIN,  WBC,  LACTICIDVEN Microbiology Recent Results (from the past 240 hour(s))  SARS Coronavirus 2 [facility] order, Performed in [facility] hospital lab) Nasopharyngeal Nasopharyngeal Swab     Status: None   Collection Time: 04/23/19 12:03 AM   Specimen: Nasopharyngeal Swab  Result Value Ref Range Status  SARS Coronavirus 2 NEGATIVE NEGATIVE Final    Comment: (NOTE) If result is NEGATIVE SARS-CoV-2 target nucleic acids are NOT DETECTED. The SARS-CoV-2 RNA is generally detectable in upper and lower  respiratory specimens during the acute phase of infection.  The lowest  concentration of SARS-CoV-2 viral copies this assay can detect is 250  copies / mL. A negative result does not preclude SARS-CoV-2 infection  and should not be used as the sole basis for treatment or other  patient management decisions.  A negative result may occur with  improper specimen collection / handling, submission of specimen other  than nasopharyngeal swab, presence of viral mutation(s) within the  areas targeted by this assay, and inadequate number of viral copies  (<250 copies / mL). A negative result must be combined with clinical  observations, patient history, and epidemiological information. If result is POSITIVE SARS-CoV-2 target nucleic acids are DETECTED. The SARS-CoV-2 RNA is generally detectable in upper and lower  respiratory specimens dur ing the acute phase of infection.  Positive  results are indicative of active infection with SARS-CoV-2.  Clinical  correlation with patient history and other diagnostic information is  necessary to determine patient infection status.  Positive results do  not rule out bacterial infection or co-infection with other viruses. If result is PRESUMPTIVE POSTIVE SARS-CoV-2 nucleic acids MAY BE PRESENT.   A presumptive positive result was obtained on the submitted specimen  and confirmed on repeat testing.  While 2019 novel coronavirus  (SARS-CoV-2) nucleic acids may be present in the submitted sample  additional confirmatory testing may be necessary for epidemiological  and / or clinical management purposes  to differentiate between  SARS-CoV-2 and other Sarbecovirus currently known to infect humans.  If clinically indicated additional testing with an alternate test  methodology [phone]) is advised. The SARS-CoV-2 RNA is generally  detectable in upper and lower respiratory sp ecimens during the acute  phase of infection. The expected result is Negative. Fact Sheet for Patients:   StrictlyIdeas.no Fact Sheet for Healthcare Providers: BankingDealers.co.za This test is not yet approved or cleared by the [place] FDA and has been authorized for detection and/or diagnosis of SARS-CoV-2 by FDA under an Emergency Use Authorization (EUA).  This EUA will remain in effect (meaning this test can be used) for the duration of the COVID-19 declaration under Section 564(b)(1) of the Act, 21 U.S.C. section 360bbb-3(b)(1), unless the authorization is terminated or revoked sooner. Performed at [facility], [address] [place], [place] [address]   MRSA PCR Screening     Status: Abnormal   Collection Time: 04/23/19  2:56 AM   Specimen: Nasal Mucosa; Nasopharyngeal  Result Value Ref Range Status   MRSA by PCR POSITIVE (A) NEGATIVE Final    Comment:        The GeneXpert MRSA Assay (FDA approved for NASAL specimens only), is one component of a comprehensive MRSA colonization surveillance program. It is not intended to diagnose MRSA infection nor to guide or monitor treatment for MRSA infections. RESULT CALLED TO, READ BACK BY AND VERIFIED WITH: HYLTON @ 1122 ON [id number] BY HENDERSON L. Performed at [facility], [address], [place], [place] [address]   Aerobic/Anaerobic Culture (surgical/deep wound)     Status: None   Collection Time: 04/23/19  1:00 PM   Specimen: Abdomen; Abscess  Result Value Ref Range Status   Specimen Description   Final    ABDOMEN Performed at [facility], [address] [place], [place] [address]  Special Requests   Final    NONE Performed at [facility], [address] [place], [place] [address]    Gram Stain   Final    ABUNDANT WBC PRESENT,BOTH PMN AND MONONUCLEAR MODERATE GRAM NEGATIVE RODS FEW GRAM POSITIVE RODS    Culture   Final    MODERATE ESCHERICHIA COLI Confirmed Extended Spectrum Beta-Lactamase Producer (ESBL).  In bloodstream infections from ESBL organisms, carbapenems  are preferred over piperacillin/tazobactam. They are shown to have a lower risk of mortality. NO ANAEROBES ISOLATED Performed at [facility], [place] [address] [place],  [address]    Report Status 04/27/2019 FINAL  Final   Organism ID, Bacteria ESCHERICHIA COLI  Final      Susceptibility   Escherichia coli - MIC*    AMPICILLIN >=32 RESISTANT Resistant     CEFAZOLIN >=64 RESISTANT Resistant     CEFEPIME RESISTANT Resistant     CEFTAZIDIME RESISTANT Resistant     CEFTRIAXONE >=64 RESISTANT Resistant     CIPROFLOXACIN >=4 RESISTANT Resistant     GENTAMICIN <=1 SENSITIVE Sensitive     IMIPENEM <=0.25 SENSITIVE Sensitive     TRIMETH/SULFA >=320 RESISTANT Resistant     AMPICILLIN/SULBACTAM >=32 RESISTANT Resistant     PIP/TAZO 8 SENSITIVE Sensitive     Extended ESBL POSITIVE Resistant     * MODERATE ESCHERICHIA COLI  SARS Coronavirus 2 [facility] order, Performed in [facility] hospital lab) Nasopharyngeal Nasopharyngeal Swab     Status: None   Collection Time: 04/27/19 10:49 PM   Specimen: Nasopharyngeal Swab  Result Value Ref Range Status   SARS Coronavirus 2 NEGATIVE NEGATIVE Final    Comment: (NOTE) If result is NEGATIVE SARS-CoV-2 target nucleic acids are NOT DETECTED. The SARS-CoV-2 RNA is generally detectable in upper and lower  respiratory specimens during the acute phase of infection. The lowest  concentration of SARS-CoV-2 viral copies this assay can detect is 250  copies / mL. A negative result does not preclude SARS-CoV-2 infection  and should not be used as the sole basis for treatment or other  patient management decisions.  A negative result may occur with  improper specimen collection / handling, submission of specimen other  than nasopharyngeal swab, presence of viral mutation(s) within the  areas targeted by this assay, and inadequate number of viral copies  (<250 copies / mL). A negative result must be combined with clinical  observations, patient  history, and epidemiological information. If result is POSITIVE SARS-CoV-2 target nucleic acids are DETECTED. The SARS-CoV-2 RNA is generally detectable in upper and lower  respiratory specimens dur ing the acute phase of infection.  Positive  results are indicative of active infection with SARS-CoV-2.  Clinical  correlation with patient history and other diagnostic information is  necessary to determine patient infection status.  Positive results do  not rule out bacterial infection or co-infection with other viruses. If result is PRESUMPTIVE POSTIVE SARS-CoV-2 nucleic acids MAY BE PRESENT.   A presumptive positive result was obtained on the submitted specimen  and confirmed on repeat testing.  While 2019 novel coronavirus  (SARS-CoV-2) nucleic acids may be present in the submitted sample  additional confirmatory testing may be necessary for epidemiological  and / or clinical management purposes  to differentiate between  SARS-CoV-2 and other Sarbecovirus currently known to infect humans.  If clinically indicated additional testing with an alternate test  methodology [phone] is advised. The SARS-CoV-2 RNA is generally  detectable in upper and lower respiratory sp ecimens during  the acute  phase of infection. The expected result is Negative. Fact Sheet for Patients:  StrictlyIdeas.no Fact Sheet for Healthcare Providers: BankingDealers.co.za This test is not yet approved or cleared by the [place] FDA and has been authorized for detection and/or diagnosis of SARS-CoV-2 by FDA under an Emergency Use Authorization (EUA).  This EUA will remain in effect (meaning this test can be used) for the duration of the COVID-19 declaration under Section 564(b)(1) of the Act, 21 U.S.C. section 360bbb-3(b)(1), unless the authorization is terminated or revoked sooner. Performed at [facility], [address] [place], [place] [address]   Culture,  blood (routine x 2)     Status: None (Preliminary result)   Collection Time: 04/28/19  1:01 PM   Specimen: Left Antecubital; Blood  Result Value Ref Range Status   Specimen Description LEFT ANTECUBITAL  Final   Special Requests   Final    BOTTLES DRAWN AEROBIC AND ANAEROBIC Blood Culture results may not be optimal due to an inadequate volume of blood received in culture bottles   Culture   Final    NO GROWTH 2 DAYS Performed at [facility], [address] [place], [place] [address]    Report Status PENDING  Incomplete  Culture, blood (routine x 2)     Status: None (Preliminary result)   Collection Time: 04/28/19  1:20 PM   Specimen: BLOOD LEFT FOREARM  Result Value Ref Range Status   Specimen Description BLOOD LEFT FOREARM  Final   Special Requests BOTTLES DRAWN AEROBIC AND ANAEROBIC BCAV  Final   Culture   Final    NO GROWTH 2 DAYS Performed at [facility], [address] [place], [place] [address]    Report Status PENDING  Incomplete     Time coordinating discharge: 35 minutes  SIGNED:   Rodena Goldmann, DO [facility] 04/30/2019, 9:38 AM  If 7PM-7AM, please contact night-coverage [email or web address] Password [email or web address]

## 2019-04-30 NOTE — Plan of Care (Signed)
  Problem: Education: Goal: Knowledge of General Education information will improve Description: Including pain rating scale, medication(s)/side effects and non-pharmacologic comfort measures Outcome: Adequate for Discharge   Problem: Health Behavior/Discharge Planning: Goal: Ability to manage health-related needs will improve Outcome: Adequate for Discharge   Problem: Clinical Measurements: Goal: Ability to maintain clinical measurements within normal limits will improve Outcome: Adequate for Discharge Goal: Will remain free from infection Outcome: Adequate for Discharge Goal: Diagnostic test results will improve Outcome: Adequate for Discharge Goal: Respiratory complications will improve Outcome: Adequate for Discharge Goal: Cardiovascular complication will be avoided Outcome: Adequate for Discharge   Problem: Activity: Goal: Risk for activity intolerance will decrease Outcome: Adequate for Discharge   Problem: Nutrition: Goal: Adequate nutrition will be maintained Outcome: Adequate for Discharge   Problem: Coping: Goal: Level of anxiety will decrease Outcome: Adequate for Discharge   Problem: Elimination: Goal: Will not experience complications related to bowel motility Outcome: Adequate for Discharge Goal: Will not experience complications related to urinary retention Outcome: Adequate for Discharge   Problem: Pain Managment: Goal: General experience of comfort will improve Outcome: Adequate for Discharge   Problem: Safety: Goal: Ability to remain free from injury will improve Outcome: Adequate for Discharge   Problem: Skin Integrity: Goal: Risk for impaired skin integrity will decrease Outcome: Adequate for Discharge   Problem: Education: Goal: Knowledge of disease or condition will improve Outcome: Adequate for Discharge Goal: Understanding of medication regimen will improve Outcome: Adequate for Discharge Goal: Individualized Educational  Video(s) Outcome: Adequate for Discharge   Problem: Activity: Goal: Ability to tolerate increased activity will improve Outcome: Adequate for Discharge   Problem: Cardiac: Goal: Ability to achieve and maintain adequate cardiopulmonary perfusion will improve Outcome: Adequate for Discharge   Problem: Health Behavior/Discharge Planning: Goal: Ability to safely manage health-related needs after discharge will improve Outcome: Adequate for Discharge

## 2019-05-01 ENCOUNTER — Other Ambulatory Visit (HOSPITAL_COMMUNITY)
Admission: RE | Admit: 2019-05-01 | Discharge: 2019-05-01 | Disposition: A | Discharge status: Home / Self Care | Payer: BC Managed Care – PPO | Source: Ambulatory Visit

## 2019-05-01 DIAGNOSIS — I482 Chronic atrial fibrillation, unspecified: Secondary | ICD-10-CM | POA: Diagnosis not present

## 2019-05-01 DIAGNOSIS — D509 Iron deficiency anemia, unspecified: Secondary | ICD-10-CM | POA: Diagnosis not present

## 2019-05-01 DIAGNOSIS — I5033 Acute on chronic diastolic (congestive) heart failure: Secondary | ICD-10-CM | POA: Diagnosis not present

## 2019-05-01 DIAGNOSIS — T8141XA Infection following a procedure, superficial incisional surgical site, initial encounter: Secondary | ICD-10-CM | POA: Diagnosis not present

## 2019-05-01 DIAGNOSIS — E1122 Type 2 diabetes mellitus with diabetic chronic kidney disease: Secondary | ICD-10-CM | POA: Diagnosis not present

## 2019-05-01 DIAGNOSIS — L03311 Cellulitis of abdominal wall: Secondary | ICD-10-CM | POA: Diagnosis not present

## 2019-05-01 DIAGNOSIS — I130 Hypertensive heart and chronic kidney disease with heart failure and stage 1 through stage 4 chronic kidney disease, or unspecified chronic kidney disease: Secondary | ICD-10-CM | POA: Diagnosis not present

## 2019-05-01 DIAGNOSIS — E1165 Type 2 diabetes mellitus with hyperglycemia: Secondary | ICD-10-CM | POA: Diagnosis not present

## 2019-05-01 DIAGNOSIS — Z029 Encounter for administrative examinations, unspecified: Secondary | ICD-10-CM | POA: Insufficient documentation

## 2019-05-01 DIAGNOSIS — S31109A Unspecified open wound of abdominal wall, unspecified quadrant without penetration into peritoneal cavity, initial encounter: Secondary | ICD-10-CM | POA: Diagnosis not present

## 2019-05-01 DIAGNOSIS — F329 Major depressive disorder, single episode, unspecified: Secondary | ICD-10-CM | POA: Diagnosis not present

## 2019-05-01 DIAGNOSIS — Z1612 Extended spectrum beta lactamase (ESBL) resistance: Secondary | ICD-10-CM | POA: Diagnosis not present

## 2019-05-01 DIAGNOSIS — T148XXA Other injury of unspecified body region, initial encounter: Secondary | ICD-10-CM | POA: Diagnosis not present

## 2019-05-01 DIAGNOSIS — N183 Chronic kidney disease, stage 3 (moderate): Secondary | ICD-10-CM | POA: Diagnosis not present

## 2019-05-01 DIAGNOSIS — L88 Pyoderma gangrenosum: Secondary | ICD-10-CM | POA: Diagnosis not present

## 2019-05-01 DIAGNOSIS — J449 Chronic obstructive pulmonary disease, unspecified: Secondary | ICD-10-CM | POA: Diagnosis not present

## 2019-05-01 DIAGNOSIS — Z4801 Encounter for change or removal of surgical wound dressing: Secondary | ICD-10-CM | POA: Diagnosis not present

## 2019-05-01 LAB — BASIC METABOLIC PANEL
Anion gap: 10 (ref 5–15)
BUN: 15 mg/dL (ref 8–23)
CO2: 22 mmol/L (ref 22–32)
Calcium: 8.3 mg/dL — ABNORMAL LOW (ref 8.9–10.3)
Chloride: 102 mmol/L (ref 98–111)
Creatinine, Ser: 0.63 mg/dL (ref 0.44–1.00)
GFR calc Af Amer: >60 mL/min (ref >60)
GFR calc non Af Amer: >60 mL/min (ref >60)
Glucose, Bld: 26 mg/dL — CL (ref 70–99)
Potassium: 3.5 mmol/L (ref 3.5–5.1)
Sodium: 134 mmol/L — ABNORMAL LOW (ref 135–145)

## 2019-05-01 LAB — CBC WITH DIFFERENTIAL/PLATELET
Abs Immature Granulocytes: 0.11 10*3/uL — ABNORMAL HIGH (ref 0.00–0.07)
Basophils Absolute: 0 10*3/uL (ref 0.0–0.1)
Basophils Relative: 0 %
Eosinophils Absolute: 0 10*3/uL (ref 0.0–0.5)
Eosinophils Relative: 0 %
HCT: 31.5 % — ABNORMAL LOW (ref 36.0–46.0)
Hemoglobin: 9.3 g/dL — ABNORMAL LOW (ref 12.0–15.0)
Immature Granulocytes: 1 %
Lymphocytes Relative: 5 %
Lymphs Abs: 0.8 10*3/uL (ref 0.7–4.0)
MCH: 25.7 pg — ABNORMAL LOW (ref 26.0–34.0)
MCHC: 29.5 g/dL — ABNORMAL LOW (ref 30.0–36.0)
MCV: 87 fL (ref 80.0–100.0)
Monocytes Absolute: 0.7 10*3/uL (ref 0.1–1.0)
Monocytes Relative: 5 %
Neutro Abs: 13 10*3/uL — ABNORMAL HIGH (ref 1.7–7.7)
Neutrophils Relative %: 89 %
Platelets: 445 10*3/uL — ABNORMAL HIGH (ref 150–400)
RBC: 3.62 MIL/uL — ABNORMAL LOW (ref 3.87–5.11)
RDW: 14.6 % (ref 11.5–15.5)
WBC: 14.6 10*3/uL — ABNORMAL HIGH (ref 4.0–10.5)
nRBC: 0 % (ref 0.0–0.2)

## 2019-05-01 LAB — SEDIMENTATION RATE: Sed Rate: 40 mm/hr — ABNORMAL HIGH (ref 0–22)

## 2019-05-01 LAB — C-REACTIVE PROTEIN: CRP: 7.4 mg/dL — ABNORMAL HIGH (ref <1.0)

## 2019-05-03 DIAGNOSIS — E1165 Type 2 diabetes mellitus with hyperglycemia: Secondary | ICD-10-CM | POA: Diagnosis not present

## 2019-05-03 DIAGNOSIS — E1122 Type 2 diabetes mellitus with diabetic chronic kidney disease: Secondary | ICD-10-CM | POA: Diagnosis not present

## 2019-05-03 DIAGNOSIS — L03311 Cellulitis of abdominal wall: Secondary | ICD-10-CM | POA: Diagnosis not present

## 2019-05-03 DIAGNOSIS — T8141XA Infection following a procedure, superficial incisional surgical site, initial encounter: Secondary | ICD-10-CM | POA: Diagnosis not present

## 2019-05-03 DIAGNOSIS — I482 Chronic atrial fibrillation, unspecified: Secondary | ICD-10-CM | POA: Diagnosis not present

## 2019-05-03 DIAGNOSIS — J449 Chronic obstructive pulmonary disease, unspecified: Secondary | ICD-10-CM | POA: Diagnosis not present

## 2019-05-03 DIAGNOSIS — D509 Iron deficiency anemia, unspecified: Secondary | ICD-10-CM | POA: Diagnosis not present

## 2019-05-03 DIAGNOSIS — L88 Pyoderma gangrenosum: Secondary | ICD-10-CM | POA: Diagnosis not present

## 2019-05-03 DIAGNOSIS — N183 Chronic kidney disease, stage 3 (moderate): Secondary | ICD-10-CM | POA: Diagnosis not present

## 2019-05-03 DIAGNOSIS — Z4801 Encounter for change or removal of surgical wound dressing: Secondary | ICD-10-CM | POA: Diagnosis not present

## 2019-05-03 DIAGNOSIS — I5033 Acute on chronic diastolic (congestive) heart failure: Secondary | ICD-10-CM | POA: Diagnosis not present

## 2019-05-03 DIAGNOSIS — I130 Hypertensive heart and chronic kidney disease with heart failure and stage 1 through stage 4 chronic kidney disease, or unspecified chronic kidney disease: Secondary | ICD-10-CM | POA: Diagnosis not present

## 2019-05-03 DIAGNOSIS — F329 Major depressive disorder, single episode, unspecified: Secondary | ICD-10-CM | POA: Diagnosis not present

## 2019-05-03 LAB — CULTURE, BLOOD (ROUTINE X 2)
Culture: NO GROWTH
Culture: NO GROWTH

## 2019-05-04 ENCOUNTER — Other Ambulatory Visit: Payer: Self-pay

## 2019-05-04 DIAGNOSIS — T8141XA Infection following a procedure, superficial incisional surgical site, initial encounter: Secondary | ICD-10-CM | POA: Diagnosis not present

## 2019-05-04 DIAGNOSIS — Z4801 Encounter for change or removal of surgical wound dressing: Secondary | ICD-10-CM | POA: Diagnosis not present

## 2019-05-04 DIAGNOSIS — L88 Pyoderma gangrenosum: Secondary | ICD-10-CM | POA: Diagnosis not present

## 2019-05-04 DIAGNOSIS — I130 Hypertensive heart and chronic kidney disease with heart failure and stage 1 through stage 4 chronic kidney disease, or unspecified chronic kidney disease: Secondary | ICD-10-CM | POA: Diagnosis not present

## 2019-05-04 DIAGNOSIS — N183 Chronic kidney disease, stage 3 (moderate): Secondary | ICD-10-CM | POA: Diagnosis not present

## 2019-05-04 DIAGNOSIS — L03311 Cellulitis of abdominal wall: Secondary | ICD-10-CM | POA: Diagnosis not present

## 2019-05-04 DIAGNOSIS — I482 Chronic atrial fibrillation, unspecified: Secondary | ICD-10-CM | POA: Diagnosis not present

## 2019-05-04 DIAGNOSIS — D509 Iron deficiency anemia, unspecified: Secondary | ICD-10-CM | POA: Diagnosis not present

## 2019-05-04 DIAGNOSIS — E1165 Type 2 diabetes mellitus with hyperglycemia: Secondary | ICD-10-CM | POA: Diagnosis not present

## 2019-05-04 DIAGNOSIS — I5033 Acute on chronic diastolic (congestive) heart failure: Secondary | ICD-10-CM | POA: Diagnosis not present

## 2019-05-04 DIAGNOSIS — E1122 Type 2 diabetes mellitus with diabetic chronic kidney disease: Secondary | ICD-10-CM | POA: Diagnosis not present

## 2019-05-04 DIAGNOSIS — J449 Chronic obstructive pulmonary disease, unspecified: Secondary | ICD-10-CM | POA: Diagnosis not present

## 2019-05-04 DIAGNOSIS — F329 Major depressive disorder, single episode, unspecified: Secondary | ICD-10-CM | POA: Diagnosis not present

## 2019-05-04 NOTE — Patient Outreach (Signed)
[facility]) Care Management  05/04/2019  [name] 1951-04-21 [id number]    Attempted post discharge follow-up with [name]. Outreach unsuccessful. Phone rang multiple times without option to leave a voice message. Will follow-up within 3-4 business days.  [facility] [phone]

## 2019-05-05 ENCOUNTER — Telehealth: Payer: Self-pay | Admitting: General Surgery

## 2019-05-05 DIAGNOSIS — L03311 Cellulitis of abdominal wall: Secondary | ICD-10-CM | POA: Diagnosis not present

## 2019-05-05 DIAGNOSIS — N183 Chronic kidney disease, stage 3 (moderate): Secondary | ICD-10-CM | POA: Diagnosis not present

## 2019-05-05 DIAGNOSIS — F329 Major depressive disorder, single episode, unspecified: Secondary | ICD-10-CM | POA: Diagnosis not present

## 2019-05-05 DIAGNOSIS — E1165 Type 2 diabetes mellitus with hyperglycemia: Secondary | ICD-10-CM | POA: Diagnosis not present

## 2019-05-05 DIAGNOSIS — E1122 Type 2 diabetes mellitus with diabetic chronic kidney disease: Secondary | ICD-10-CM | POA: Diagnosis not present

## 2019-05-05 DIAGNOSIS — I130 Hypertensive heart and chronic kidney disease with heart failure and stage 1 through stage 4 chronic kidney disease, or unspecified chronic kidney disease: Secondary | ICD-10-CM | POA: Diagnosis not present

## 2019-05-05 DIAGNOSIS — I482 Chronic atrial fibrillation, unspecified: Secondary | ICD-10-CM | POA: Diagnosis not present

## 2019-05-05 DIAGNOSIS — D509 Iron deficiency anemia, unspecified: Secondary | ICD-10-CM | POA: Diagnosis not present

## 2019-05-05 DIAGNOSIS — I5033 Acute on chronic diastolic (congestive) heart failure: Secondary | ICD-10-CM | POA: Diagnosis not present

## 2019-05-05 DIAGNOSIS — Z4801 Encounter for change or removal of surgical wound dressing: Secondary | ICD-10-CM | POA: Diagnosis not present

## 2019-05-05 DIAGNOSIS — J449 Chronic obstructive pulmonary disease, unspecified: Secondary | ICD-10-CM | POA: Diagnosis not present

## 2019-05-05 DIAGNOSIS — T8141XA Infection following a procedure, superficial incisional surgical site, initial encounter: Secondary | ICD-10-CM | POA: Diagnosis not present

## 2019-05-05 DIAGNOSIS — L88 Pyoderma gangrenosum: Secondary | ICD-10-CM | POA: Diagnosis not present

## 2019-05-05 NOTE — Telephone Encounter (Signed)
[facility] RN is seeing patient. HR 170s and BP 150/100. She has not been taking her medication and has not been checking her sugar.  The Home Health RN got her to take her medication.  The patient is going to recheck her pulse in about 1 hr and BP.  The patient needs to go to the hospital if her HR and BP are not improved. Try to call her husband and left message telling him what Clarise Cruz the Home health RN told me.     Will just stay with wet to dry dressing twice daily.  Stopping wound vacuum.   Curlene Labrum, MD [facility] [address] [place], [place] [address] [phone] (office)

## 2019-05-06 DIAGNOSIS — W19XXXA Unspecified fall, initial encounter: Secondary | ICD-10-CM | POA: Diagnosis not present

## 2019-05-06 DIAGNOSIS — Z1612 Extended spectrum beta lactamase (ESBL) resistance: Secondary | ICD-10-CM | POA: Diagnosis not present

## 2019-05-06 DIAGNOSIS — T148XXA Other injury of unspecified body region, initial encounter: Secondary | ICD-10-CM | POA: Diagnosis not present

## 2019-05-06 DIAGNOSIS — E1165 Type 2 diabetes mellitus with hyperglycemia: Secondary | ICD-10-CM | POA: Diagnosis not present

## 2019-05-06 DIAGNOSIS — S31109A Unspecified open wound of abdominal wall, unspecified quadrant without penetration into peritoneal cavity, initial encounter: Secondary | ICD-10-CM | POA: Diagnosis not present

## 2019-05-09 ENCOUNTER — Inpatient Hospital Stay (HOSPITAL_COMMUNITY)
Admission: EM | Admit: 2019-05-09 | Discharge: 2019-05-25 | DRG: 308 | Disposition: A | Discharge status: Skilled Nursing Facility | Payer: BC Managed Care – PPO | Attending: Internal Medicine | Admitting: Internal Medicine

## 2019-05-09 ENCOUNTER — Other Ambulatory Visit: Payer: Self-pay

## 2019-05-09 ENCOUNTER — Emergency Department (HOSPITAL_COMMUNITY): Payer: BC Managed Care – PPO

## 2019-05-09 ENCOUNTER — Encounter (HOSPITAL_COMMUNITY): Payer: Self-pay

## 2019-05-09 ENCOUNTER — Telehealth: Payer: Self-pay | Admitting: General Surgery

## 2019-05-09 DIAGNOSIS — S31109D Unspecified open wound of abdominal wall, unspecified quadrant without penetration into peritoneal cavity, subsequent encounter: Secondary | ICD-10-CM | POA: Diagnosis not present

## 2019-05-09 DIAGNOSIS — T82898A Other specified complication of vascular prosthetic devices, implants and grafts, initial encounter: Secondary | ICD-10-CM | POA: Diagnosis not present

## 2019-05-09 DIAGNOSIS — N183 Chronic kidney disease, stage 3 unspecified: Secondary | ICD-10-CM | POA: Diagnosis not present

## 2019-05-09 DIAGNOSIS — R05 Cough: Secondary | ICD-10-CM

## 2019-05-09 DIAGNOSIS — D8989 Other specified disorders involving the immune mechanism, not elsewhere classified: Secondary | ICD-10-CM | POA: Diagnosis not present

## 2019-05-09 DIAGNOSIS — G92 Toxic encephalopathy: Secondary | ICD-10-CM | POA: Diagnosis not present

## 2019-05-09 DIAGNOSIS — Z452 Encounter for adjustment and management of vascular access device: Secondary | ICD-10-CM | POA: Diagnosis not present

## 2019-05-09 DIAGNOSIS — J449 Chronic obstructive pulmonary disease, unspecified: Secondary | ICD-10-CM | POA: Diagnosis present

## 2019-05-09 DIAGNOSIS — L03311 Cellulitis of abdominal wall: Secondary | ICD-10-CM | POA: Diagnosis not present

## 2019-05-09 DIAGNOSIS — Z8619 Personal history of other infectious and parasitic diseases: Secondary | ICD-10-CM | POA: Diagnosis not present

## 2019-05-09 DIAGNOSIS — Z5181 Encounter for therapeutic drug level monitoring: Secondary | ICD-10-CM | POA: Diagnosis not present

## 2019-05-09 DIAGNOSIS — Z20828 Contact with and (suspected) exposure to other viral communicable diseases: Secondary | ICD-10-CM | POA: Diagnosis not present

## 2019-05-09 DIAGNOSIS — Z7401 Bed confinement status: Secondary | ICD-10-CM

## 2019-05-09 DIAGNOSIS — M810 Age-related osteoporosis without current pathological fracture: Secondary | ICD-10-CM | POA: Diagnosis present

## 2019-05-09 DIAGNOSIS — Z794 Long term (current) use of insulin: Secondary | ICD-10-CM

## 2019-05-09 DIAGNOSIS — I5033 Acute on chronic diastolic (congestive) heart failure: Secondary | ICD-10-CM | POA: Diagnosis not present

## 2019-05-09 DIAGNOSIS — G9340 Encephalopathy, unspecified: Secondary | ICD-10-CM

## 2019-05-09 DIAGNOSIS — Z993 Dependence on wheelchair: Secondary | ICD-10-CM

## 2019-05-09 DIAGNOSIS — Z9049 Acquired absence of other specified parts of digestive tract: Secondary | ICD-10-CM

## 2019-05-09 DIAGNOSIS — I513 Intracardiac thrombosis, not elsewhere classified: Secondary | ICD-10-CM | POA: Diagnosis not present

## 2019-05-09 DIAGNOSIS — Z7901 Long term (current) use of anticoagulants: Secondary | ICD-10-CM

## 2019-05-09 DIAGNOSIS — E1122 Type 2 diabetes mellitus with diabetic chronic kidney disease: Secondary | ICD-10-CM | POA: Diagnosis not present

## 2019-05-09 DIAGNOSIS — E1165 Type 2 diabetes mellitus with hyperglycemia: Secondary | ICD-10-CM | POA: Diagnosis not present

## 2019-05-09 DIAGNOSIS — Z79899 Other long term (current) drug therapy: Secondary | ICD-10-CM

## 2019-05-09 DIAGNOSIS — R002 Palpitations: Secondary | ICD-10-CM | POA: Diagnosis not present

## 2019-05-09 DIAGNOSIS — I5030 Unspecified diastolic (congestive) heart failure: Secondary | ICD-10-CM | POA: Diagnosis not present

## 2019-05-09 DIAGNOSIS — F329 Major depressive disorder, single episode, unspecified: Secondary | ICD-10-CM | POA: Diagnosis not present

## 2019-05-09 DIAGNOSIS — R42 Dizziness and giddiness: Secondary | ICD-10-CM | POA: Diagnosis not present

## 2019-05-09 DIAGNOSIS — E11649 Type 2 diabetes mellitus with hypoglycemia without coma: Secondary | ICD-10-CM | POA: Diagnosis not present

## 2019-05-09 DIAGNOSIS — J9811 Atelectasis: Secondary | ICD-10-CM | POA: Diagnosis not present

## 2019-05-09 DIAGNOSIS — I4891 Unspecified atrial fibrillation: Secondary | ICD-10-CM | POA: Diagnosis not present

## 2019-05-09 DIAGNOSIS — Z90710 Acquired absence of both cervix and uterus: Secondary | ICD-10-CM | POA: Diagnosis not present

## 2019-05-09 DIAGNOSIS — R4182 Altered mental status, unspecified: Secondary | ICD-10-CM | POA: Diagnosis not present

## 2019-05-09 DIAGNOSIS — M6281 Muscle weakness (generalized): Secondary | ICD-10-CM | POA: Diagnosis not present

## 2019-05-09 DIAGNOSIS — Z87440 Personal history of urinary (tract) infections: Secondary | ICD-10-CM

## 2019-05-09 DIAGNOSIS — Z8614 Personal history of Methicillin resistant Staphylococcus aureus infection: Secondary | ICD-10-CM | POA: Diagnosis not present

## 2019-05-09 DIAGNOSIS — Z888 Allergy status to other drugs, medicaments and biological substances status: Secondary | ICD-10-CM

## 2019-05-09 DIAGNOSIS — L88 Pyoderma gangrenosum: Secondary | ICD-10-CM | POA: Diagnosis not present

## 2019-05-09 DIAGNOSIS — Z91040 Latex allergy status: Secondary | ICD-10-CM

## 2019-05-09 DIAGNOSIS — I482 Chronic atrial fibrillation, unspecified: Secondary | ICD-10-CM | POA: Diagnosis not present

## 2019-05-09 DIAGNOSIS — T8141XA Infection following a procedure, superficial incisional surgical site, initial encounter: Secondary | ICD-10-CM | POA: Diagnosis not present

## 2019-05-09 DIAGNOSIS — Z9114 Patient's other noncompliance with medication regimen: Secondary | ICD-10-CM | POA: Diagnosis not present

## 2019-05-09 DIAGNOSIS — R5381 Other malaise: Secondary | ICD-10-CM | POA: Diagnosis not present

## 2019-05-09 DIAGNOSIS — I130 Hypertensive heart and chronic kidney disease with heart failure and stage 1 through stage 4 chronic kidney disease, or unspecified chronic kidney disease: Secondary | ICD-10-CM | POA: Diagnosis not present

## 2019-05-09 DIAGNOSIS — Z4801 Encounter for change or removal of surgical wound dressing: Secondary | ICD-10-CM | POA: Diagnosis not present

## 2019-05-09 DIAGNOSIS — R059 Cough, unspecified: Secondary | ICD-10-CM

## 2019-05-09 DIAGNOSIS — Z833 Family history of diabetes mellitus: Secondary | ICD-10-CM

## 2019-05-09 DIAGNOSIS — Z7951 Long term (current) use of inhaled steroids: Secondary | ICD-10-CM

## 2019-05-09 DIAGNOSIS — R9431 Abnormal electrocardiogram [ECG] [EKG]: Secondary | ICD-10-CM | POA: Diagnosis not present

## 2019-05-09 DIAGNOSIS — E876 Hypokalemia: Secondary | ICD-10-CM | POA: Diagnosis not present

## 2019-05-09 DIAGNOSIS — R1310 Dysphagia, unspecified: Secondary | ICD-10-CM | POA: Diagnosis not present

## 2019-05-09 DIAGNOSIS — D509 Iron deficiency anemia, unspecified: Secondary | ICD-10-CM | POA: Diagnosis present

## 2019-05-09 DIAGNOSIS — R531 Weakness: Secondary | ICD-10-CM | POA: Diagnosis not present

## 2019-05-09 DIAGNOSIS — Z823 Family history of stroke: Secondary | ICD-10-CM

## 2019-05-09 LAB — CBC WITH DIFFERENTIAL/PLATELET
Abs Immature Granulocytes: 0.05 10*3/uL (ref 0.00–0.07)
Basophils Absolute: 0.1 10*3/uL (ref 0.0–0.1)
Basophils Relative: 1 %
Eosinophils Absolute: 0.2 10*3/uL (ref 0.0–0.5)
Eosinophils Relative: 3 %
HCT: 31.4 % — ABNORMAL LOW (ref 36.0–46.0)
Hemoglobin: 8.8 g/dL — ABNORMAL LOW (ref 12.0–15.0)
Immature Granulocytes: 1 %
Lymphocytes Relative: 11 %
Lymphs Abs: 0.9 10*3/uL (ref 0.7–4.0)
MCH: 25.1 pg — ABNORMAL LOW (ref 26.0–34.0)
MCHC: 28 g/dL — ABNORMAL LOW (ref 30.0–36.0)
MCV: 89.5 fL (ref 80.0–100.0)
Monocytes Absolute: 0.5 10*3/uL (ref 0.1–1.0)
Monocytes Relative: 7 %
Neutro Abs: 6 10*3/uL (ref 1.7–7.7)
Neutrophils Relative %: 77 %
Platelets: 343 10*3/uL (ref 150–400)
RBC: 3.51 MIL/uL — ABNORMAL LOW (ref 3.87–5.11)
RDW: 16.5 % — ABNORMAL HIGH (ref 11.5–15.5)
WBC: 7.7 10*3/uL (ref 4.0–10.5)
nRBC: 0 % (ref 0.0–0.2)

## 2019-05-09 LAB — COMPREHENSIVE METABOLIC PANEL
ALT: 15 U/L (ref 0–44)
AST: 13 U/L — ABNORMAL LOW (ref 15–41)
Albumin: 2.1 g/dL — ABNORMAL LOW (ref 3.5–5.0)
Alkaline Phosphatase: 94 U/L (ref 38–126)
Anion gap: 9 (ref 5–15)
BUN: 9 mg/dL (ref 8–23)
CO2: 26 mmol/L (ref 22–32)
Calcium: 7.5 mg/dL — ABNORMAL LOW (ref 8.9–10.3)
Chloride: 105 mmol/L (ref 98–111)
Creatinine, Ser: 0.63 mg/dL (ref 0.44–1.00)
GFR calc Af Amer: >60 mL/min (ref >60)
GFR calc non Af Amer: >60 mL/min (ref >60)
Glucose, Bld: 193 mg/dL — ABNORMAL HIGH (ref 70–99)
Potassium: 3.4 mmol/L — ABNORMAL LOW (ref 3.5–5.1)
Sodium: 140 mmol/L (ref 135–145)
Total Bilirubin: 0.9 mg/dL (ref 0.3–1.2)
Total Protein: 5.3 g/dL — ABNORMAL LOW (ref 6.5–8.1)

## 2019-05-09 LAB — TROPONIN I (HIGH SENSITIVITY)
Troponin I (High Sensitivity): 9 ng/L (ref <18)
Troponin I (High Sensitivity): 9 ng/L (ref <18)

## 2019-05-09 LAB — BRAIN NATRIURETIC PEPTIDE: B Natriuretic Peptide: 461 pg/mL — ABNORMAL HIGH (ref 0.0–100.0)

## 2019-05-09 LAB — GLUCOSE, CAPILLARY: Glucose-Capillary: 198 mg/dL — ABNORMAL HIGH (ref 70–99)

## 2019-05-09 LAB — SARS CORONAVIRUS 2 BY RT PCR (HOSPITAL ORDER, PERFORMED IN ~~LOC~~ HOSPITAL LAB): SARS Coronavirus 2: NEGATIVE

## 2019-05-09 MED ORDER — PREDNISONE 20 MG PO TABS
40.0000 mg | ORAL_TABLET | Freq: Every day | ORAL | Status: DC
  Administered 2019-05-10: 08:00:00 40 mg via ORAL
  Filled 2019-05-09: qty 2

## 2019-05-09 MED ORDER — ENSURE ENLIVE PO LIQD
237.0000 mL | Freq: Two times a day (BID) | ORAL | Status: DC
  Administered 2019-05-10 – 2019-05-23 (×17): 237 mL via ORAL

## 2019-05-09 MED ORDER — NYSTATIN 100000 UNIT/GM EX POWD: Freq: Every day | CUTANEOUS | Status: DC | PRN

## 2019-05-09 MED ORDER — ESCITALOPRAM OXALATE 10 MG PO TABS
10.0000 mg | ORAL_TABLET | Freq: Every day | ORAL | Status: DC
  Administered 2019-05-10 – 2019-05-17 (×8): 10 mg via ORAL
  Filled 2019-05-09 (×8): qty 1

## 2019-05-09 MED ORDER — APIXABAN 5 MG PO TABS
5.0000 mg | ORAL_TABLET | Freq: Two times a day (BID) | ORAL | Status: DC
  Administered 2019-05-09 – 2019-05-25 (×31): 5 mg via ORAL
  Filled 2019-05-09 (×25): qty 1
  Filled 2019-05-09: qty 2
  Filled 2019-05-09 (×6): qty 1

## 2019-05-09 MED ORDER — SODIUM CHLORIDE 0.9 % IV SOLN: 250.0000 mL | INTRAVENOUS | Status: DC | PRN

## 2019-05-09 MED ORDER — BUPROPION HCL ER (XL) 300 MG PO TB24
300.0000 mg | ORAL_TABLET | Freq: Every day | ORAL | Status: DC
  Administered 2019-05-10 – 2019-05-17 (×8): 300 mg via ORAL
  Filled 2019-05-09 (×2): qty 1
  Filled 2019-05-09: qty 2
  Filled 2019-05-09 (×2): qty 1
  Filled 2019-05-09: qty 2
  Filled 2019-05-09 (×2): qty 1

## 2019-05-09 MED ORDER — DILTIAZEM HCL 25 MG/5ML IV SOLN
10.0000 mg | Freq: Once | INTRAVENOUS | Status: AC
  Administered 2019-05-09: 10 mg via INTRAVENOUS

## 2019-05-09 MED ORDER — SODIUM CHLORIDE 0.9% FLUSH
3.0000 mL | Freq: Two times a day (BID) | INTRAVENOUS | Status: DC
  Administered 2019-05-09 – 2019-05-16 (×13): 3 mL via INTRAVENOUS

## 2019-05-09 MED ORDER — CHLORHEXIDINE GLUCONATE CLOTH 2 % EX PADS
6.0000 | MEDICATED_PAD | Freq: Every day | CUTANEOUS | Status: DC
  Administered 2019-05-10 – 2019-05-25 (×16): 6 via TOPICAL

## 2019-05-09 MED ORDER — METOPROLOL TARTRATE 50 MG PO TABS
50.0000 mg | ORAL_TABLET | Freq: Two times a day (BID) | ORAL | Status: DC
  Administered 2019-05-09 – 2019-05-25 (×31): 50 mg via ORAL
  Filled 2019-05-09 (×32): qty 1

## 2019-05-09 MED ORDER — OXYCODONE HCL 5 MG PO TABS
5.0000 mg | ORAL_TABLET | Freq: Four times a day (QID) | ORAL | Status: DC | PRN
  Administered 2019-05-10 – 2019-05-11 (×4): 10 mg via ORAL
  Administered 2019-05-12 – 2019-05-20 (×3): 5 mg via ORAL
  Filled 2019-05-09 (×2): qty 2
  Filled 2019-05-09: qty 1
  Filled 2019-05-09: qty 2
  Filled 2019-05-09: qty 1
  Filled 2019-05-09: qty 2
  Filled 2019-05-09: qty 1

## 2019-05-09 MED ORDER — SODIUM CHLORIDE 0.9 % IV SOLN
1.0000 g | INTRAVENOUS | Status: DC
  Administered 2019-05-10 – 2019-05-13 (×4): 1000 mg via INTRAVENOUS
  Filled 2019-05-09 (×8): qty 1

## 2019-05-09 MED ORDER — ONDANSETRON HCL 4 MG PO TABS
4.0000 mg | ORAL_TABLET | Freq: Four times a day (QID) | ORAL | Status: DC | PRN
  Administered 2019-05-10 – 2019-05-11 (×2): 4 mg via ORAL
  Filled 2019-05-09 (×2): qty 1

## 2019-05-09 MED ORDER — FLUTICASONE PROPIONATE 50 MCG/ACT NA SUSP: 2.0000 | Freq: Every day | NASAL | Status: DC | PRN

## 2019-05-09 MED ORDER — FUROSEMIDE 10 MG/ML IJ SOLN
20.0000 mg | Freq: Two times a day (BID) | INTRAMUSCULAR | Status: DC
  Administered 2019-05-09 – 2019-05-10 (×2): 20 mg via INTRAVENOUS
  Filled 2019-05-09 (×2): qty 2

## 2019-05-09 MED ORDER — DILTIAZEM HCL 100 MG IV SOLR
5.0000 mg/h | INTRAVENOUS | Status: DC
  Administered 2019-05-09 – 2019-05-10 (×2): 5 mg/h via INTRAVENOUS
  Filled 2019-05-09 (×3): qty 100

## 2019-05-09 MED ORDER — FERROUS SULFATE 325 (65 FE) MG PO TABS
325.0000 mg | ORAL_TABLET | Freq: Two times a day (BID) | ORAL | Status: DC
  Administered 2019-05-10 – 2019-05-25 (×30): 325 mg via ORAL
  Filled 2019-05-09 (×29): qty 1

## 2019-05-09 MED ORDER — ACETAMINOPHEN 500 MG PO TABS: 500.0000 mg | ORAL_TABLET | Freq: Three times a day (TID) | ORAL | Status: DC | PRN

## 2019-05-09 MED ORDER — POTASSIUM CHLORIDE CRYS ER 20 MEQ PO TBCR
20.0000 meq | EXTENDED_RELEASE_TABLET | Freq: Every day | ORAL | Status: DC
  Administered 2019-05-09: 21:00:00 20 meq via ORAL
  Filled 2019-05-09: qty 1

## 2019-05-09 MED ORDER — INSULIN ASPART 100 UNIT/ML ~~LOC~~ SOLN: 0.0000 [IU] | Freq: Three times a day (TID) | SUBCUTANEOUS | Status: DC

## 2019-05-09 MED ORDER — ONDANSETRON HCL 4 MG/2ML IJ SOLN
4.0000 mg | Freq: Once | INTRAMUSCULAR | Status: AC
  Administered 2019-05-09: 18:00:00 4 mg via INTRAVENOUS
  Filled 2019-05-09: qty 2

## 2019-05-09 MED ORDER — PANTOPRAZOLE SODIUM 40 MG PO TBEC
40.0000 mg | DELAYED_RELEASE_TABLET | Freq: Every day | ORAL | Status: DC
  Administered 2019-05-10 – 2019-05-25 (×16): 40 mg via ORAL
  Filled 2019-05-09 (×16): qty 1

## 2019-05-09 MED ORDER — SODIUM CHLORIDE 0.9% FLUSH: 3.0000 mL | INTRAVENOUS | Status: DC | PRN

## 2019-05-09 MED ORDER — INSULIN DETEMIR 100 UNIT/ML ~~LOC~~ SOLN
10.0000 [IU] | Freq: Two times a day (BID) | SUBCUTANEOUS | Status: DC
  Administered 2019-05-10: 10 [IU] via SUBCUTANEOUS
  Filled 2019-05-09 (×4): qty 0.1

## 2019-05-09 MED ORDER — LEVALBUTEROL HCL 0.63 MG/3ML IN NEBU: 0.6300 mg | INHALATION_SOLUTION | Freq: Four times a day (QID) | RESPIRATORY_TRACT | Status: DC | PRN

## 2019-05-09 MED ORDER — ONDANSETRON HCL 4 MG/2ML IJ SOLN: 4.0000 mg | Freq: Four times a day (QID) | INTRAMUSCULAR | Status: DC | PRN

## 2019-05-09 NOTE — H&P (Signed)
TRH H&P    Patient Demographics:    Wania Longstreth, is a 68 y.o. female  MRN: [id number]  DOB - September 15, 1950  Admit Date - 05/09/2019  Referring MD/NP/PA: Dr. Roderic Palau  Outpatient Primary MD for the patient is [name], MD  Patient coming from: Home  Chief complaint-palpitations   HPI:    Vernelle Wisner  is a 68 y.o. female, with history of atrial fibrillation on anticoagulation therapy with apixaban, diabetes mellitus type 2, LV thrombus, hypertension, depression, abdominal wound infection,?  Pyoderma gangrenosum, currently on IV Invanz.  Patient was recently discharged from the hospital on 04/30/2019 when she was admitted with abdominal wound infection and was discharged on Invanz for 21 days.  As per patient she also has been taking high-dose prednisone which was prescribed by general surgery on 03/09/2019 after she had debridement of the abdominal wound and biopsy came back?  Pyoderma gangrenosum.  As per patient, she noticed that her blood sugar has been very high at home she takes 60 mg of prednisone daily.  She was not discharged on prednisone on 04/30/2019 as per discharge summary.  She denies chest pain but complains of palpitations. Admits to having shortness of breath. Complains of nausea but denies vomiting and diarrhea. Denies abdominal pain or dysuria.  In the ED patient started on IV Cardizem infusion for atrial fibrillation with RVR.   Review of systems:    In addition to the HPI above,    All other systems reviewed and are negative.    Past History of the following :    Past Medical History:  Diagnosis Date  . Atrial fibrillation ([place])   . CHF (congestive heart failure) ([place])   . Depression   . Diabetes mellitus without complication ([place])   . 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 Hypertension   . Morbid obesity ([place])   . Osteoporosis    . Urinary incontinence   . UTI (lower urinary tract infection) 01/2016   Cipro for Klebsiella pneumoniae isolate  . Wheelchair bound    bedbound      Past Surgical History:  Procedure Laterality Date  . ABDOMINAL HYSTERECTOMY    . APPLICATION OF WOUND VAC  03/09/2019   Procedure: APPLICATION OF WOUND VAC;  Surgeon: Virl Cagey, MD;  Location: [facility];  Service: General;;  . BIOPSY  09/06/2018   Procedure: BIOPSY;  Surgeon: Danie Binder, MD;  [facility];  Service: Endoscopy;;  gastric bx's  . CESAREAN SECTION    . CHOLECYSTECTOMY    . ESOPHAGEAL DILATION  09/06/2018   Procedure: ESOPHAGEAL DILATION;  Surgeon: Danie Binder, MD;  [facility];  Service: Endoscopy;;  . ESOPHAGOGASTRODUODENOSCOPY (EGD) WITH PROPOFOL N/A 09/06/2018   Procedure: ESOPHAGOGASTRODUODENOSCOPY (EGD) WITH PROPOFOL;  Surgeon: Danie Binder, MD;  [facility];  Service: Endoscopy;  Laterality: N/A;  dilatation  . FEMUR IM NAIL Left 02/20/2016  . FEMUR IM NAIL Left 02/20/2016   Procedure: INTRAMEDULLARY (IM) RETROGRADE FEMORAL NAILING;  Surgeon: Georga Kaufmann  Ephriam Jenkins, MD;  Location: [name];  Service: Orthopedics;  Laterality: Left;  . PANCREAS SURGERY  1967   1 cyst excised and one cyst drained  . TEE WITHOUT CARDIOVERSION N/A 04/05/2018   Procedure: TRANSESOPHAGEAL ECHOCARDIOGRAM (TEE);  Surgeon: Dorothy Spark, MD;  Location: [place];  Service: Cardiovascular;  Laterality: N/A;  . [place]    . WOUND DEBRIDEMENT N/A 03/09/2019   Procedure: EXCISIONAL DEBRIDEMENT OF ABDOMINAL WOUND ULCERS;  Surgeon: Virl Cagey, MD;  Location: [facility];  Service: General;  Laterality: N/A;  . WRIST FRACTURE SURGERY        Social History:      Social History   Tobacco Use  . Smoking status: Never Smoker  . Smokeless tobacco: Never Used  Substance Use Topics  . Alcohol use: No       Family History :     Family History  Problem Relation Age of Onset   . Diabetes Mother        died in her 73's of a stroke  . Cancer Mother   . Stroke Mother   . Breast cancer Mother   . Diabetes Father        died in his 67's of a stroke.  . Stroke Father   . Diabetes Brother        died @ 50 of a stroke.  . Stroke Brother   . Diabetes Maternal Grandmother   . Diabetes Maternal Grandfather   . Diabetes Paternal Grandmother   . Diabetes Paternal Grandfather   . Breast cancer Maternal Aunt       Home Medications:   Prior to Admission medications   Medication Sig Start Date End Date Taking? Authorizing Provider  acetaminophen (TYLENOL) 500 MG tablet Take 500-1,000 mg by mouth every 8 (eight) hours as needed for mild pain or headache.     [provider]  albuterol (PROVENTIL HFA;VENTOLIN HFA) 108 (90 Base) MCG/ACT inhaler Inhale 2 puffs into the lungs every 6 (six) hours as needed for wheezing or shortness of breath. Patient taking differently: Inhale 2 puffs into the lungs every 6 (six) hours as needed for wheezing or shortness of breath (As needed).  08/14/18   Kathie Dike, MD  apixaban (ELIQUIS) 5 MG TABS tablet Take 1 tablet (5 mg total) by mouth 2 (two) times daily. 07/21/18   Herminio Commons, MD  buPROPion (WELLBUTRIN XL) 300 MG 24 hr tablet Take 300 mg by mouth daily.  01/31/19   [provider]  diltiazem (CARDIZEM CD) 120 MG 24 hr capsule Take 1 capsule (120 mg total) by mouth daily. Patient taking differently: Take 120 mg by mouth daily. Cartia XT 09/10/18   Johnson, Clanford L, MD  ertapenem (INVANZ) IVPB Inject 1 g into the vein daily. Indication:  Abdominal ESBL infection Last Day of Therapy: 21 days Labs - Once weekly:  CBC/D and BMP, Labs - Every other week:  ESR and CRP 04/30/19   Manuella Ghazi, Pratik D, DO  escitalopram (LEXAPRO) 10 MG tablet Take 10 mg by mouth daily. 12/28/18   [provider]  ferrous sulfate (FERROUSUL) 325 (65 FE) MG tablet Take 1 tablet (325 mg total) by mouth 2 (two) times daily with a  meal. 01/16/19   Domenic Polite, MD  fluticasone [facility]) 50 MCG/ACT nasal spray Place 2 sprays into both nostrils daily as needed for allergies or rhinitis (congestion).     [provider]  furosemide (LASIX) 20 MG tablet Take 2  tablets (40 mg total) by mouth 2 (two) times daily. 03/11/19 04/27/19  Murlean Iba, MD  levalbuterol Penne Lash) 0.63 MG/3ML nebulizer solution Take 0.63 mg by nebulization every 6 (six) hours as needed for wheezing or shortness of breath.  02/08/19   [provider]  LEVEMIR FLEXTOUCH 100 UNIT/ML Pen Inject 10 Units into the skin 2 (two) times a day. 01/05/19   [provider]  metoprolol tartrate (LOPRESSOR) 50 MG tablet Take 50 mg by mouth 2 (two) times daily.  02/12/19   [provider]  nutrition supplement, JUVEN, (JUVEN) PACK Take 1 packet by mouth 2 (two) times daily between meals. 04/25/19   Barton Dubois, MD  nystatin (MYCOSTATIN/NYSTOP) powder Apply topically daily as needed (rash in skin folds).    [provider]  ondansetron (ZOFRAN) 4 MG tablet Take 1 tablet (4 mg total) by mouth every 6 (six) hours as needed for nausea. 04/30/19   Manuella Ghazi, Pratik D, DO  OVER THE COUNTER MEDICATION Apply 1 application topically daily as needed (rash in skin folds). Baby butt paste    [provider]  oxyCODONE (OXY IR/ROXICODONE) 5 MG immediate release tablet Take 1-2 tablets (5-10 mg total) by mouth every 6 (six) hours as needed for severe pain or breakthrough pain (wound vacuum changes). 04/27/19   Virl Cagey, MD  pantoprazole (PROTONIX) 40 MG tablet Take 1 tablet (40 mg total) by mouth daily before breakfast. Patient taking differently: Take 40 mg by mouth daily as needed (for GERD/acid reflux).  09/11/18   Murlean Iba, MD     Allergies:     Allergies  Allergen Reactions  . Feraheme [Ferumoxytol] Other (See Comments)    Back pain (yelling out with back pain)  . Propranolol Swelling    Pt states it may  have been leg swelling  . Topamax [Topiramate] Other (See Comments)    hallucinations  . Latex Itching and Rash     Physical Exam:   Vitals  Blood pressure (!) 150/71, pulse (!) 414, temperature 98 F (36.7 C), temperature source Oral, resp. rate 18, height _0  (1.626 m), weight 89.8 kg, SpO2 92 %.  1.  General: Appears in no acute distress  2. Psychiatric: Alert, oriented x3, intact insight and judgment  3. Neurologic: Cranial nerves II through XII grossly intact, motor strength 5/5 in all extremities  4. HEENMT:  Atraumatic normocephalic, extraocular muscles are intact  5. Respiratory : Bibasilar crackles auscultated on lung examination  6. Cardiovascular : S1-S2, regular, no murmur auscultated  7. Gastrointestinal:  Abdomen is soft, nontender, no organomegaly  8. Skin:  Abdominal wound on lower abdomen noted, in dressing      Data Review:    CBC Recent Labs  Lab 05/09/19 1744  WBC 7.7  HGB 8.8*  HCT 31.4*  PLT 343  MCV 89.5  MCH 25.1*  MCHC 28.0*  RDW 16.5*  LYMPHSABS 0.9  MONOABS 0.5  EOSABS 0.2  BASOSABS 0.1   ------------------------------------------------------------------------------------------------------------------  Results for orders placed or performed during the hospital encounter of 05/09/19 (from the past 48 hour(s))  SARS Coronavirus 2 [facility] order, Performed in [facility] hospital lab) Nasopharyngeal Nasopharyngeal Swab     Status: None   Collection Time: 05/09/19  5:37 PM   Specimen: Nasopharyngeal Swab  Result Value Ref Range   SARS Coronavirus 2 NEGATIVE NEGATIVE    Comment: (NOTE) If result is NEGATIVE SARS-CoV-2 target nucleic acids are NOT DETECTED. The SARS-CoV-2 RNA is generally detectable in upper  and lower  respiratory specimens during the acute phase of infection. The lowest  concentration of SARS-CoV-2 viral copies this assay can detect is 250  copies / mL. A negative result does not preclude SARS-CoV-2  infection  and should not be used as the sole basis for treatment or other  patient management decisions.  A negative result may occur with  improper specimen collection / handling, submission of specimen other  than nasopharyngeal swab, presence of viral mutation(s) within the  areas targeted by this assay, and inadequate number of viral copies  (<250 copies / mL). A negative result must be combined with clinical  observations, patient history, and epidemiological information. If result is POSITIVE SARS-CoV-2 target nucleic acids are DETECTED. The SARS-CoV-2 RNA is generally detectable in upper and lower  respiratory specimens dur ing the acute phase of infection.  Positive  results are indicative of active infection with SARS-CoV-2.  Clinical  correlation with patient history and other diagnostic information is  necessary to determine patient infection status.  Positive results do  not rule out bacterial infection or co-infection with other viruses. If result is PRESUMPTIVE POSTIVE SARS-CoV-2 nucleic acids MAY BE PRESENT.   A presumptive positive result was obtained on the submitted specimen  and confirmed on repeat testing.  While 2019 novel coronavirus  (SARS-CoV-2) nucleic acids may be present in the submitted sample  additional confirmatory testing may be necessary for epidemiological  and / or clinical management purposes  to differentiate between  SARS-CoV-2 and other Sarbecovirus currently known to infect humans.  If clinically indicated additional testing with an alternate test  methodology [phone]) is advised. The SARS-CoV-2 RNA is generally  detectable in upper and lower respiratory sp ecimens during the acute  phase of infection. The expected result is Negative. Fact Sheet for Patients:  StrictlyIdeas.no Fact Sheet for Healthcare Providers: BankingDealers.co.za This test is not yet approved or cleared by the [place]  FDA and has been authorized for detection and/or diagnosis of SARS-CoV-2 by FDA under an Emergency Use Authorization (EUA).  This EUA will remain in effect (meaning this test can be used) for the duration of the COVID-19 declaration under Section 564(b)(1) of the Act, 21 U.S.C. section 360bbb-3(b)(1), unless the authorization is terminated or revoked sooner. Performed at [facility] [address] [place], [place] [address]   CBC with Differential/Platelet     Status: Abnormal   Collection Time: 05/09/19  5:44 PM  Result Value Ref Range   WBC 7.7 4.0 - 10.5 K/uL   RBC 3.51 (L) 3.87 - 5.11 MIL/uL   Hemoglobin 8.8 (L) 12.0 - 15.0 g/dL   HCT 31.4 (L) 36.0 - 46.0 %   MCV 89.5 80.0 - 100.0 fL   MCH 25.1 (L) 26.0 - 34.0 pg   MCHC 28.0 (L) 30.0 - 36.0 g/dL   RDW 16.5 (H) 11.5 - 15.5 %   Platelets 343 150 - 400 K/uL   nRBC 0.0 0.0 - 0.2 %   Neutrophils Relative % 77 %   Neutro Abs 6.0 1.7 - 7.7 K/uL   Lymphocytes Relative 11 %   Lymphs Abs 0.9 0.7 - 4.0 K/uL   Monocytes Relative 7 %   Monocytes Absolute 0.5 0.1 - 1.0 K/uL   Eosinophils Relative 3 %   Eosinophils Absolute 0.2 0.0 - 0.5 K/uL   Basophils Relative 1 %   Basophils Absolute 0.1 0.0 - 0.1 K/uL   Immature Granulocytes 1 %   Abs Immature Granulocytes 0.05 0.00 - 0.07  K/uL    Comment: Performed at [facility], [address], [place], [place] [address]  Comprehensive metabolic panel     Status: Abnormal   Collection Time: 05/09/19  5:44 PM  Result Value Ref Range   Sodium 140 135 - 145 mmol/L   Potassium 3.4 (L) 3.5 - 5.1 mmol/L   Chloride 105 98 - 111 mmol/L   CO2 26 22 - 32 mmol/L   Glucose, Bld 193 (H) 70 - 99 mg/dL   BUN 9 8 - 23 mg/dL   Creatinine, Ser 0.63 0.44 - 1.00 mg/dL   Calcium 7.5 (L) 8.9 - 10.3 mg/dL   Total Protein 5.3 (L) 6.5 - 8.1 g/dL   Albumin 2.1 (L) 3.5 - 5.0 g/dL   AST 13 (L) 15 - 41 U/L   ALT 15 0 - 44 U/L   Alkaline Phosphatase 94 38 - 126 U/L   Total Bilirubin 0.9 0.3 - 1.2 mg/dL   GFR  calc non Af Amer >60 >60 mL/min   GFR calc Af Amer >60 >60 mL/min   Anion gap 9 5 - 15    Comment: Performed at [facility], [address] [place], [place] [address]  Troponin I (High Sensitivity)     Status: None   Collection Time: 05/09/19  5:44 PM  Result Value Ref Range   Troponin I (High Sensitivity) 9 <18 ng/L    Comment: (NOTE) Elevated high sensitivity troponin I (hsTnI) values and significant  changes across serial measurements may suggest ACS but many other  chronic and acute conditions are known to elevate hsTnI results.  Refer to the "Links" section for chest pain algorithms and additional  guidance. Performed at [facility], [address], [place], [place] [address]   Brain natriuretic peptide     Status: Abnormal   Collection Time: 05/09/19  5:50 PM  Result Value Ref Range   B Natriuretic Peptide 461.0 (H) 0.0 - 100.0 pg/mL    Comment: Performed at [facility], [address] [place], [place] [address]    Chemistries  Recent Labs  Lab 05/09/19 1744  NA 140  K 3.4*  CL 105  CO2 26  GLUCOSE 193*  BUN 9  CREATININE 0.63  CALCIUM 7.5*  AST 13*  ALT 15  ALKPHOS 94  BILITOT 0.9   ------------------------------------------------------------------------------------------------------------------  ------------------------------------------------------------------------------------------------------------------ GFR: Estimated Creatinine Clearance: 73 mL/min (by C-G formula based on SCr of 0.63 mg/dL). Liver Function Tests: Recent Labs  Lab 05/09/19 1744  AST 13*  ALT 15  ALKPHOS 94  BILITOT 0.9  PROT 5.3*  ALBUMIN 2.1*    --------------------------------------------------------------------------------------------------------------- Urine analysis:    Component Value Date/Time   COLORURINE YELLOW 03/07/2019 2104   APPEARANCEUR HAZY (A) 03/07/2019 2104   LABSPEC 1.024 03/07/2019 2104   PHURINE 6.0 03/07/2019 2104   GLUCOSEU NEGATIVE 03/07/2019  2104   HGBUR NEGATIVE 03/07/2019 2104   BILIRUBINUR NEGATIVE 03/07/2019 2104   [place] NEGATIVE 03/07/2019 2104   PROTEINUR 100 (A) 03/07/2019 2104   UROBILINOGEN 0.2 12/01/2007 1025   NITRITE NEGATIVE 03/07/2019 2104   LEUKOCYTESUR MODERATE (A) 03/07/2019 2104      Imaging Results:    Dg Chest Portable 1 View  Result Date: 05/09/2019 CLINICAL DATA:  Nausea and headache. EXAM: PORTABLE CHEST 1 VIEW COMPARISON:  Single-view of the chest 04/29/2019 and 02/17/2019. CT chest 04/22/2019. FINDINGS: The lungs are clear. Heart size is upper normal. No pneumothorax or pleural fluid. No acute or focal bony abnormality. Right PICC is backed out with  its tip now projecting in the right axilla. IMPRESSION: No acute disease. The patient's right PICC is backed out with its tip now projecting in the right axilla. Electronically Signed   By: Inge Rise M.D.   On: 05/09/2019 18:44    My personal review of EKG: Rhythm NSR, nonspecific ST changes   Assessment & Plan:    Active Problems:   Atrial fibrillation with RVR (HCC)   1. Atrial fibrillation with RVR-patient presented with A. fib with RVR, started on IV Cardizem.  We will continue with IV Cardizem gtt.  Admit to stepdown.  Continue with Eliquis for anticoagulation.  Continue metoprolol p.o. 50 mg twice a day.  2. Abdominal wound/?  Pyoderma gangrenosum-patient has been on IV Invanz 1 g daily since 04/30/2019.  She is supposed to continue taking for 21 days.  We will continue with Invanz.  Patient says that she was prescribed prednisone since July and has been taking high-dose prednisone.  However she was not discharged on prednisone during previous admission.  I called and discussed with general surgeon Dr. Constance Haw who had initially prescribed prednisone.  Patient is noncompliant with taking prednisone.  At this time me and Dr. Constance Haw  think that it is best to taper of prednisone over next 10 days and at her follow-up with dermatology for  further recommendations.  3. Diabetes mellitus type 2-continue Levemir, will initiate sliding scale insulin with NovoLog.  4. Acute on chronic diastolic CHF-patient has bibasilar crackles on exam, BNP mildly elevated 461.  She takes Lasix at home.  She has not been taking her p.o. medications due to nausea.  Will start with Lasix 20 mg IV every 12 hours.        DVT Prophylaxis-   apixaban  AM Labs Ordered, also please review Full Orders  Family Communication: Admission, patients condition and plan of care including tests being ordered have been discussed with the patient  who indicate understanding and agree with the plan and Code Status.  Code Status: Full code  Admission status: Inpatient: Based on patients clinical presentation and evaluation of above clinical data, I have made determination that patient meets Inpatient criteria at this time.  Time spent in minutes : 60 minutes   Oswald Hillock M.D on 05/09/2019 at 8:39 PM

## 2019-05-09 NOTE — ED Triage Notes (Signed)
Pt's HR is all over the place she states. Is nauseated and has a headache. History of a-fib. States she is possibly hallucinating as well.

## 2019-05-09 NOTE — Progress Notes (Signed)
Spoke with bedside nurse Jeneen Rinks regarding patient's PICC.  Per CXR PICC now sits at patient's right axilla therefore it is no longer central and is malpositioned.  Kerby Moors to have MD order for PICC exchange and do not use.  IV team will be in touch once order is received and arrange a time to replace.  Patient currently has another IV which is infusing IV medication.  Carolee Rota, RN

## 2019-05-09 NOTE — Telephone Encounter (Signed)
[facility]  Called to check on patient. She had not been taking her meds last week when Home Health RN came and tried to call her husband but had to leave message about making sure she was taking her medicine and that her HR came down.  She says that her HR came down after her medication was given.  She says her HR has been up and she is retaining fluid in her legs.  She sees [name], MD, PCP next week.  She says she is having hard time breathing at times. She says she has not been able to get out of bed.    I told her she likely needs to go to the hospital or go see her PCP if she feels like she is retaining fluid and SOB. I am afraid she might be in A fib with RVR again and it does not sound like this is controlled.  Clarise Cruz, Home Health RN is coming soon.  Wound is looking ok per patient and doing wet to dry dressing.  [place] RN message to get her to make sure patient goes to hospital if vitals are abnormal.  Curlene Labrum, MD [facility] [place], [place] [address] [phone] (office)

## 2019-05-09 NOTE — Progress Notes (Signed)
Spoke with VAS/IV Team RN Levada Dy in reference to malpositioned PICC line. Site appears Clean, Dry, and Intact on the surface however it does not return blood flow which was assessed prior to IV team discussion about verified malposition.   Line is saline locked and is NOT in use.  Physician is notified for exchange order and an alternate, peripheral IV is in use for Medications.

## 2019-05-09 NOTE — ED Provider Notes (Signed)
[facility] EMERGENCY DEPARTMENT Provider Note   CSN: [id number] Arrival date & time: 05/09/19  1637     History   Chief Complaint Chief Complaint  Patient presents with  . Palpitations    HPI [name] is a 68 y.o. female.     Patient complains of palpitations dizziness and weakness.  Patient has a history of atrial fib  The history is provided by the patient. No language interpreter was used.  Palpitations Palpitations quality:  Regular Onset quality:  Sudden Timing:  Constant Progression:  Worsening Chronicity:  Recurrent Context: not anxiety   Relieved by:  Nothing Worsened by:  Nothing Ineffective treatments:  None tried Associated symptoms: no back pain, no chest pain and no cough     Past Medical History:  Diagnosis Date  . Atrial fibrillation ([place])   . CHF (congestive heart failure) ([place])   . Depression   . Diabetes mellitus without complication ([place])   . 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 Hypertension   . Morbid obesity ([place])   . Osteoporosis   . Urinary incontinence   . UTI (lower urinary tract infection) 01/2016   Cipro for Klebsiella pneumoniae isolate  . Wheelchair bound    bedbound    Patient Active Problem List   Diagnosis Date Noted  . Atrial fibrillation with tachycardic ventricular rate ([place]) 04/28/2019  . Diabetes ([place]) 04/22/2019  . Pyoderma gangrenosum 03/11/2019  . Wound infection 03/09/2019  . Chronic wound infection of abdomen   . Wheelchair bound   . Abdominal wall skin ulcer ([place]) 03/07/2019  . Acute pulmonary edema ([place]) 02/17/2019  . Iron deficiency anemia 02/01/2019  . Abdominal wall ulcer, with fat layer exposed ([place]) 01/16/2019  . Elevated troponin   . Nausea and vomiting   . Symptomatic bradycardia 01/10/2019  . DKA (diabetic ketoacidoses) ([place]) 01/10/2019  . Urinary incontinence 12/28/2018  . Recurrent urinary tract infection 12/16/2018  . Wound, open, anterior  abdominal wall 12/05/2018  . Hypotension due to hypovolemia   . Gastritis due to nonsteroidal anti-inflammatory drug   . Esophageal dysphagia   . GI bleed 09/03/2018  . Coagulopathy ([place])   . Colitis   . Lower abdominal pain   . Rectal bleeding   . Dehydration 08/16/2018  . Acute on chronic diastolic CHF (congestive heart failure) ([place]) 08/10/2018  . Acute lower UTI 08/10/2018  . Hypokalemia 08/10/2018  . COPD (chronic obstructive pulmonary disease) ([place]) 08/10/2018  . Thyroid lesion 08/10/2018  . Closed fracture of right ankle 06/10/2018  . Closed fracture of left tibia and fibula with routine healing 04/13/18 06/10/2018  . Atrial fibrillation ([place]) 04/15/2018  . Chronic diastolic HF (heart failure) ([place]) 04/15/2018  . CKD (chronic kidney disease), stage III ([place]) 04/15/2018  . Closed right fibular fracture 04/15/2018  . Left tibial fracture 04/14/2018  . CHF exacerbation ([place]) 04/10/2018  . SOB (shortness of breath)   . Acute CHF (congestive heart failure) ([place]) 04/01/2018  . Atrial fibrillation with RVR ([place]) 04/01/2018  . [place] 03/11/2016  . Muscle spasms of lower extremity 03/04/2016  . Acute renal insufficiency 02/26/2016  . History of MRSA infection 02/26/2016  . HTN (hypertension) 02/19/2016  . Fracture, femur, distal ([place]) 02/19/2016  . Fall 02/19/2016  . Depression 02/19/2016  . Closed left femoral fracture, initial encounter 02/19/2016  . Femur fracture, left ([place]) 02/19/2016    Past Surgical History:  Procedure Laterality Date  . ABDOMINAL HYSTERECTOMY    . APPLICATION OF  WOUND VAC  03/09/2019   Procedure: APPLICATION OF WOUND VAC;  Surgeon: Virl Cagey, MD;  Location: [facility] ORS;  Service: General;;  . BIOPSY  09/06/2018   Procedure: BIOPSY;  Surgeon: Danie Binder, MD;  Location: [facility] ENDO SUITE;  Service: Endoscopy;;  gastric bx's  . CESAREAN SECTION    . CHOLECYSTECTOMY    . ESOPHAGEAL DILATION  09/06/2018   Procedure: ESOPHAGEAL DILATION;  Surgeon:  Danie Binder, MD;  Location: [facility] END[facility] SU[facility];  Service: Endoscopy;;  . ESOPHAGOGASTRODUODENOSCOPY (EGD) WITH PROPOFOL N/A 09/06/2018   Procedure: ESOPHAGOGASTRODUODENOSCOPY (EGD) WITH PROPOFOL;  Surgeon: Danie Binder, MD;  Location: [facility];  Service: Endoscopy;  Laterality: N/A;  dilatation  . FEMUR IM NAIL Left 02/20/2016  . FEMUR IM NAIL Left 02/20/2016   Procedure: INTRAMEDULLARY (IM) RETROGRADE FEMORAL NAILING;  Surgeon: Leandrew Koyanagi, MD;  Location: [facility];  Service: Orthopedics;  Laterality: Left;  . PANCREAS SURGERY  1967   1 cyst excised and one cyst drained  . TEE WITHOUT CARDIOVERSION N/A 04/05/2018   Procedure: TRANSESOPHAGEAL ECHOCARDIOGRAM (TEE);  Surgeon: Dorothy Spark, MD;  Location: [facility];  Service: Cardiovascular;  Laterality: N/A;  . [place]    . WOUND DEBRIDEMENT N/A 03/09/2019   Procedure: EXCISIONAL DEBRIDEMENT OF ABDOMINAL WOUND ULCERS;  Surgeon: Virl Cagey, MD;  Location: [facility];  Service: General;  Laterality: N/A;  . WRIST FRACTURE SURGERY       OB History   No obstetric history on file.      Home Medications    Prior to Admission medications   Medication Sig Start Date End Date Taking? Authorizing Provider  acetaminophen (TYLENOL) 500 MG tablet Take 500-1,000 mg by mouth every 8 (eight) hours as needed for mild pain or headache.     [provider]  albuterol (PROVENTIL HFA;VENTOLIN HFA) 108 (90 Base) MCG/ACT inhaler Inhale 2 puffs into the lungs every 6 (six) hours as needed for wheezing or shortness of breath. Patient taking differently: Inhale 2 puffs into the lungs every 6 (six) hours as needed for wheezing or shortness of breath (As needed).  08/14/18   Kathie Dike, MD  apixaban (ELIQUIS) 5 MG TABS tablet Take 1 tablet (5 mg total) by mouth 2 (two) times daily. 07/21/18   Herminio Commons, MD  buPROPion (WELLBUTRIN XL) 300 MG 24 hr tablet Take 300 mg by mouth daily.  01/31/19   [provider]  diltiazem (CARDIZEM CD) 120 MG 24 hr capsule Take 1 capsule (120 mg total) by mouth daily. Patient taking differently: Take 120 mg by mouth daily. Cartia XT 09/10/18   Johnson, Clanford L, MD  ertapenem (INVANZ) IVPB Inject 1 g into the vein daily. Indication:  Abdominal ESBL infection Last Day of Therapy: 21 days Labs - Once weekly:  CBC/D and BMP, Labs - Every other week:  ESR and CRP 04/30/19   Manuella Ghazi, Pratik D, DO  escitalopram (LEXAPRO) 10 MG tablet Take 10 mg by mouth daily. 12/28/18   [provider]  ferrous sulfate (FERROUSUL) 325 (65 FE) MG tablet Take 1 tablet (325 mg total) by mouth 2 (two) times daily with a meal. 01/16/19   Domenic Polite, MD  fluticasone [facility]) 50 MCG/ACT nasal spray Place 2 sprays into both nostrils daily as needed for allergies or rhinitis (congestion).     [provider]  furosemide (LASIX) 20 MG tablet Take 2 tablets (40 mg total) by mouth 2 (two) times daily. 03/11/19  04/27/19  Murlean Iba, MD  levalbuterol Penne Lash) 0.63 MG/3ML nebulizer solution Take 0.63 mg by nebulization every 6 (six) hours as needed for wheezing or shortness of breath.  02/08/19   [provider]  LEVEMIR FLEXTOUCH 100 UNIT/ML Pen Inject 10 Units into the skin 2 (two) times a day. 01/05/19   [provider]  metoprolol tartrate (LOPRESSOR) 50 MG tablet Take 50 mg by mouth 2 (two) times daily.  02/12/19   [provider]  nutrition supplement, JUVEN, (JUVEN) PACK Take 1 packet by mouth 2 (two) times daily between meals. 04/25/19   Barton Dubois, MD  nystatin (MYCOSTATIN/NYSTOP) powder Apply topically daily as needed (rash in skin folds).    [provider]  ondansetron (ZOFRAN) 4 MG tablet Take 1 tablet (4 mg total) by mouth every 6 (six) hours as needed for nausea. 04/30/19   Manuella Ghazi, Pratik D, DO  OVER THE COUNTER MEDICATION Apply 1 application topically daily as needed (rash in skin folds). Baby butt paste    [provider]  oxyCODONE (OXY IR/ROXICODONE) 5 MG immediate release tablet Take 1-2 tablets (5-10 mg total) by mouth every 6 (six) hours as needed for severe pain or breakthrough pain (wound vacuum changes). 04/27/19   Virl Cagey, MD  pantoprazole (PROTONIX) 40 MG tablet Take 1 tablet (40 mg total) by mouth daily before breakfast. Patient taking differently: Take 40 mg by mouth daily as needed (for GERD/acid reflux).  09/11/18   Murlean Iba, MD    Family History Family History  Problem Relation Age of Onset  . Diabetes Mother        died in her 50's of a stroke  . Cancer Mother   . Stroke Mother   . Breast cancer Mother   . Diabetes Father        died in his 53's of a stroke.  . Stroke Father   . Diabetes Brother        died @ 75 of a stroke.  . Stroke Brother   . Diabetes Maternal Grandmother   . Diabetes Maternal Grandfather   . Diabetes Paternal Grandmother   . Diabetes Paternal Grandfather   . Breast cancer Maternal Aunt     Social History Social History   Tobacco Use  . Smoking status: Never Smoker  . Smokeless tobacco: Never Used  Substance Use Topics  . Alcohol use: No  . Drug use: No     Allergies   Feraheme [ferumoxytol], Propranolol, Topamax [topiramate], and Latex   Review of Systems Review of Systems  Constitutional: Negative for appetite change and fatigue.  HENT: Negative for congestion, ear discharge and sinus pressure.   Eyes: Negative for discharge.  Respiratory: Negative for cough.   Cardiovascular: Positive for palpitations. Negative for chest pain.  Gastrointestinal: Negative for abdominal pain and diarrhea.  Genitourinary: Negative for frequency and hematuria.  Musculoskeletal: Negative for back pain.  Skin: Negative for rash.  Neurological: Negative for seizures and headaches.  Psychiatric/Behavioral: Negative for hallucinations.     Physical Exam Updated Vital Signs BP (!) 112/96   Pulse (!) 120 Comment: Cardizem bolus  complete,   Temp 98 F (36.7 C) (Oral)   Resp 18   Ht _0  (1.626 m)   Wt 89.8 kg   SpO2 95%   BMI 33.99 kg/m   Physical Exam Vitals signs and nursing note reviewed.  Constitutional:      Appearance: She is well-developed.  HENT:     Head:  Normocephalic.     Nose: Nose normal.  Eyes:     General: No scleral icterus.    Conjunctiva/sclera: Conjunctivae normal.  Neck:     Musculoskeletal: Neck supple.     Thyroid: No thyromegaly.  Cardiovascular:     Heart sounds: No murmur. No friction rub. No gallop.      Comments: Irregular rapid rate Pulmonary:     Breath sounds: No stridor. No wheezing or rales.  Chest:     Chest wall: No tenderness.  Abdominal:     General: There is no distension.     Tenderness: There is no abdominal tenderness. There is no rebound.  Musculoskeletal: Normal range of motion.  Lymphadenopathy:     Cervical: No cervical adenopathy.  Skin:    Findings: No erythema or rash.  Neurological:     Mental Status: She is oriented to person, place, and time.     Motor: No abnormal muscle tone.     Coordination: Coordination normal.  Psychiatric:        Behavior: Behavior normal.      ED Treatments / Results  Labs (all labs ordered are listed, but only abnormal results are displayed) Labs Reviewed  CBC WITH DIFFERENTIAL/PLATELET - Abnormal; Notable for the following components:      Result Value   RBC 3.51 (*)    Hemoglobin 8.8 (*)    HCT 31.4 (*)    MCH 25.1 (*)    MCHC 28.0 (*)    RDW 16.5 (*)    All other components within normal limits  COMPREHENSIVE METABOLIC PANEL - Abnormal; Notable for the following components:   Potassium 3.4 (*)    Glucose, Bld 193 (*)    Calcium 7.5 (*)    Total Protein 5.3 (*)    Albumin 2.1 (*)    AST 13 (*)    All other components within normal limits  BRAIN NATRIURETIC PEPTIDE - Abnormal; Notable for the following components:   B Natriuretic Peptide 461.0 (*)    All other components within normal limits   SARS CORONAVIRUS 2 (HOSPITAL ORDER, [place] [facility])  TROPONIN I (HIGH SENSITIVITY)  TROPONIN I (HIGH SENSITIVITY)    EKG EKG Interpretation  Date/Time:  Monday May 09 2019 17:14:53 EDT Ventricular Rate:  155 PR Interval:    QRS Duration: 70 QT Interval:  244 QTC Calculation: 392 R Axis:   62 Text Interpretation:  Atrial fibrillation with rapid ventricular response Cannot rule out Anterior infarct , age undetermined ST & T wave abnormality, consider inferior ischemia Abnormal ECG Confirmed by Milton Ferguson [id number]) on 05/09/2019 5:32:02 PM   Radiology Dg Chest Portable 1 View  Result Date: 05/09/2019 CLINICAL DATA:  Nausea and headache. EXAM: PORTABLE CHEST 1 VIEW COMPARISON:  Single-view of the chest 04/29/2019 and 02/17/2019. CT chest 04/22/2019. FINDINGS: The lungs are clear. Heart size is upper normal. No pneumothorax or pleural fluid. No acute or focal bony abnormality. Right PICC is backed out with its tip now projecting in the right axilla. IMPRESSION: No acute disease. The patient's right PICC is backed out with its tip now projecting in the right axilla. Electronically Signed   By: Inge Rise M.D.   On: 05/09/2019 18:44    Procedures Procedures (including critical care time)  Medications Ordered in ED Medications  diltiazem (CARDIZEM) 100 mg in dextrose 5 % 100 mL (1 mg/mL) infusion (7.5 mg/hr Intravenous Rate/Dose Change 05/09/19 1836)  diltiazem (CARDIZEM) injection 10 mg (10 mg Intravenous Bolus  05/09/19 1800)  ondansetron (ZOFRAN) injection 4 mg (4 mg Intravenous Given 05/09/19 1758)     Initial Impression / Assessment and Plan / ED Course  I have reviewed the triage vital signs and the nursing notes.  Pertinent labs & imaging results that were available during my care of the patient were reviewed by me and considered in my medical decision making (see chart for details).        CRITICAL CARE Performed by: Milton Ferguson Total  critical care time: 45 minutes Critical care time was exclusive of separately billable procedures and treating other patients. Critical care was necessary to treat or prevent imminent or life-threatening deterioration. Critical care was time spent personally by me on the following activities: development of treatment plan with patient and/or surrogate as well as nursing, discussions with consultants, evaluation of patient's response to treatment, examination of patient, obtaining history from patient or surrogate, ordering and performing treatments and interventions, ordering and review of laboratory studies, ordering and review of radiographic studies, pulse oximetry and re-evaluation of patient's condition. Patient will be admitted for rapid atrial fib.  She was put on a Cardizem drip and her rate came down to 100  Final Clinical Impressions(s) / ED Diagnoses   Final diagnoses:  None    ED Discharge Orders    None       Milton Ferguson, MD 05/09/19 1946

## 2019-05-09 NOTE — ED Notes (Signed)
ED TO INPATIENT HANDOFF REPORT  ED Nurse Name and Phone #: Nilo Fallin [phone]  S Name/Age/Gender [name] 68 y.o. female Room/Bed: APA04/APA04  Code Status   Code Status: Prior  Home/SNF/Other Home Patient oriented to: self, place, time and situation Is this baseline? Yes   Triage Complete: Triage complete  Chief Complaint Shortness of Breath  Triage Note Pt's HR is all over the place she states. Is nauseated and has a headache. History of a-fib. States she is possibly hallucinating as well.    Allergies Allergies  Allergen Reactions  . Feraheme [Ferumoxytol] Other (See Comments)    Back pain (yelling out with back pain)  . Propranolol Swelling    Pt states it may have been leg swelling  . Topamax [Topiramate] Other (See Comments)    hallucinations  . Latex Itching and Rash    Level of Care/Admitting Diagnosis ED Disposition    ED Disposition Condition [facility] Area: [facility] [[id number]]  Level of Care: Stepdown [14]  Covid Evaluation: Confirmed COVID Negative  Diagnosis: Atrial fibrillation with RVR [facility]) [[id number]]  Admitting Physician: Oswald Hillock [[id number]]  Attending Physician: Oswald Hillock [[id number]]  Estimated length of stay: 3 - 4 days  Certification:: I certify this patient will need inpatient services for at least 2 midnights  PT Class (Do Not Modify): Inpatient [101]  PT Acc Code (Do Not Modify): Private [1]       B Medical/Surgery History Past Medical History:  Diagnosis Date  . Atrial fibrillation ([place])   . CHF (congestive heart failure) ([place])   . Depression   . Diabetes mellitus without complication ([place])   . 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 Hypertension   . Morbid obesity ([place])   . Osteoporosis   . Urinary incontinence   . UTI (lower urinary tract infection) 01/2016   Cipro for Klebsiella pneumoniae isolate  . Wheelchair bound    bedbound   Past Surgical History:   Procedure Laterality Date  . ABDOMINAL HYSTERECTOMY    . APPLICATION OF WOUND VAC  03/09/2019   Procedure: APPLICATION OF WOUND VAC;  Surgeon: Virl Cagey, MD;  Location: [facility] ORS;  Service: General;;  . BIOPSY  09/06/2018   Procedure: BIOPSY;  Surgeon: Danie Binder, MD;  Location: [facility] ENDO SUITE;  Service: Endoscopy;;  gastric bx's  . CESAREAN SECTION    . CHOLECYSTECTOMY    . ESOPHAGEAL DILATION  09/06/2018   Procedure: ESOPHAGEAL DILATION;  Surgeon: Danie Binder, MD;  Location: [facility] ENDO SUITE;  Service: Endoscopy;;  . ESOPHAGOGASTRODUODENOSCOPY (EGD) WITH PROPOFOL N/A 09/06/2018   Procedure: ESOPHAGOGASTRODUODENOSCOPY (EGD) WITH PROPOFOL;  Surgeon: Danie Binder, MD;  Location: [facility] ENDO SUITE;  Service: Endoscopy;  Laterality: N/A;  dilatation  . FEMUR IM NAIL Left 02/20/2016  . FEMUR IM NAIL Left 02/20/2016   Procedure: INTRAMEDULLARY (IM) RETROGRADE FEMORAL NAILING;  Surgeon: Leandrew Koyanagi, MD;  Location: [facility];  Service: Orthopedics;  Laterality: Left;  . PANCREAS SURGERY  1967   1 cyst excised and one cyst drained  . TEE WITHOUT CARDIOVERSION N/A 04/05/2018   Procedure: TRANSESOPHAGEAL ECHOCARDIOGRAM (TEE);  Surgeon: Dorothy Spark, MD;  Location: [place];  Service: Cardiovascular;  Laterality: N/A;  . [place]    . WOUND DEBRIDEMENT N/A 03/09/2019   Procedure: EXCISIONAL DEBRIDEMENT OF ABDOMINAL WOUND ULCERS;  Surgeon: Virl Cagey, MD;  Location: [facility] ORS;  Service: General;  Laterality: N/A;  . WRIST FRACTURE SURGERY       A IV Location/Drains/Wounds Patient Lines/Drains/Airways Status   Active Line/Drains/Airways    Name:   Placement date:   Placement time:   Site:   Days:   Peripheral IV 05/09/19 Left Arm   05/09/19    1944    Arm   less than 1   PICC Single Lumen 04/29/19 PICC Right Brachial 36 cm 0 cm   04/29/19    1700    Brachial   10   Wound / Incision (Open or Dehisced) 02/17/19 Incision - Open Abdomen Lower;Right round   02/17/19     1855    Abdomen   81   Wound / Incision (Open or Dehisced) 04/28/19 Non-pressure wound Thigh Left small round open wound to inner left thigh 2cm x 2cm   04/28/19    0319    Thigh   11          Intake/Output Last 24 hours No intake or output data in the 24 hours ending 05/09/19 2135  Labs/Imaging Results for orders placed or performed during the hospital encounter of 05/09/19 (from the past 48 hour(s))  SARS Coronavirus 2 [facility] order, Performed in [facility] hospital lab) Nasopharyngeal Nasopharyngeal Swab     Status: None   Collection Time: 05/09/19  5:37 PM   Specimen: Nasopharyngeal Swab  Result Value Ref Range   SARS Coronavirus 2 NEGATIVE NEGATIVE    Comment: (NOTE) If result is NEGATIVE SARS-CoV-2 target nucleic acids are NOT DETECTED. The SARS-CoV-2 RNA is generally detectable in upper and lower  respiratory specimens during the acute phase of infection. The lowest  concentration of SARS-CoV-2 viral copies this assay can detect is 250  copies / mL. A negative result does not preclude SARS-CoV-2 infection  and should not be used as the sole basis for treatment or other  patient management decisions.  A negative result may occur with  improper specimen collection / handling, submission of specimen other  than nasopharyngeal swab, presence of viral mutation(s) within the  areas targeted by this assay, and inadequate number of viral copies  (<250 copies / mL). A negative result must be combined with clinical  observations, patient history, and epidemiological information. If result is POSITIVE SARS-CoV-2 target nucleic acids are DETECTED. The SARS-CoV-2 RNA is generally detectable in upper and lower  respiratory specimens dur ing the acute phase of infection.  Positive  results are indicative of active infection with SARS-CoV-2.  Clinical  correlation with patient history and other diagnostic information is  necessary to determine patient infection status.  Positive  results do  not rule out bacterial infection or co-infection with other viruses. If result is PRESUMPTIVE POSTIVE SARS-CoV-2 nucleic acids MAY BE PRESENT.   A presumptive positive result was obtained on the submitted specimen  and confirmed on repeat testing.  While 2019 novel coronavirus  (SARS-CoV-2) nucleic acids may be present in the submitted sample  additional confirmatory testing may be necessary for epidemiological  and / or clinical management purposes  to differentiate between  SARS-CoV-2 and other Sarbecovirus currently known to infect humans.  If clinically indicated additional testing with an alternate test  methodology [phone]) is advised. The SARS-CoV-2 RNA is generally  detectable in upper and lower respiratory sp ecimens during the acute  phase of infection. The expected result is Negative. Fact Sheet for Patients:  StrictlyIdeas.no Fact Sheet for Healthcare Providers: BankingDealers.co.za This test is not yet approved or cleared by  the [employer] and has been authorized for detection and/or diagnosis of SARS-CoV-2 by FDA under an Emergency Use Authorization (EUA).  This EUA will remain in effect (meaning this test can be used) for the duration of the COVID-19 declaration under Section 564(b)(1) of the Act, 21 U.S.C. section 360bbb-3(b)(1), unless the authorization is terminated or revoked sooner. Performed at [facility], [address], [place], [place] [address]   CBC with Differential/Platelet     Status: Abnormal   Collection Time: 05/09/19  5:44 PM  Result Value Ref Range   WBC 7.7 4.0 - 10.5 K/uL   RBC 3.51 (L) 3.87 - 5.11 MIL/uL   Hemoglobin 8.8 (L) 12.0 - 15.0 g/dL   HCT 31.4 (L) 36.0 - 46.0 %   MCV 89.5 80.0 - 100.0 fL   MCH 25.1 (L) 26.0 - 34.0 pg   MCHC 28.0 (L) 30.0 - 36.0 g/dL   RDW 16.5 (H) 11.5 - 15.5 %   Platelets 343 150 - 400 K/uL   nRBC 0.0 0.0 - 0.2 %   Neutrophils Relative % 77 %    Neutro Abs 6.0 1.7 - 7.7 K/uL   Lymphocytes Relative 11 %   Lymphs Abs 0.9 0.7 - 4.0 K/uL   Monocytes Relative 7 %   Monocytes Absolute 0.5 0.1 - 1.0 K/uL   Eosinophils Relative 3 %   Eosinophils Absolute 0.2 0.0 - 0.5 K/uL   Basophils Relative 1 %   Basophils Absolute 0.1 0.0 - 0.1 K/uL   Immature Granulocytes 1 %   Abs Immature Granulocytes 0.05 0.00 - 0.07 K/uL    Comment: Performed at [facility], [address], [place], [place] [address]  Comprehensive metabolic panel     Status: Abnormal   Collection Time: 05/09/19  5:44 PM  Result Value Ref Range   Sodium 140 135 - 145 mmol/L   Potassium 3.4 (L) 3.5 - 5.1 mmol/L   Chloride 105 98 - 111 mmol/L   CO2 26 22 - 32 mmol/L   Glucose, Bld 193 (H) 70 - 99 mg/dL   BUN 9 8 - 23 mg/dL   Creatinine, Ser 0.63 0.44 - 1.00 mg/dL   Calcium 7.5 (L) 8.9 - 10.3 mg/dL   Total Protein 5.3 (L) 6.5 - 8.1 g/dL   Albumin 2.1 (L) 3.5 - 5.0 g/dL   AST 13 (L) 15 - 41 U/L   ALT 15 0 - 44 U/L   Alkaline Phosphatase 94 38 - 126 U/L   Total Bilirubin 0.9 0.3 - 1.2 mg/dL   GFR calc non Af Amer >60 >60 mL/min   GFR calc Af Amer >60 >60 mL/min   Anion gap 9 5 - 15    Comment: Performed at [facility], [address], [place], [place] [address]  Troponin I (High Sensitivity)     Status: None   Collection Time: 05/09/19  5:44 PM  Result Value Ref Range   Troponin I (High Sensitivity) 9 <18 ng/L    Comment: (NOTE) Elevated high sensitivity troponin I (hsTnI) values and significant  changes across serial measurements may suggest ACS but many other  chronic and acute conditions are known to elevate hsTnI results.  Refer to the "Links" section for chest pain algorithms and additional  guidance. Performed at [facility], [address], [place], [place] [address]   Brain natriuretic peptide     Status: Abnormal   Collection Time: 05/09/19  5:50 PM  Result Value Ref Range   B Natriuretic Peptide  461.0 (H) 0.0 - 100.0 pg/mL    Comment: Performed at [facility], [address], [place], [place] [address]   Dg Chest Portable 1 View  Result Date: 05/09/2019 CLINICAL DATA:  Nausea and headache. EXAM: PORTABLE CHEST 1 VIEW COMPARISON:  Single-view of the chest 04/29/2019 and 02/17/2019. CT chest 04/22/2019. FINDINGS: The lungs are clear. Heart size is upper normal. No pneumothorax or pleural fluid. No acute or focal bony abnormality. Right PICC is backed out with its tip now projecting in the right axilla. IMPRESSION: No acute disease. The patient's right PICC is backed out with its tip now projecting in the right axilla. Electronically Signed   By: Inge Rise M.D.   On: 05/09/2019 18:44    Pending Labs [employer] (From admission, onward)    Start     Ordered   Signed and Held  CBC  Tomorrow morning,   R     Signed and Held   Signed and Held  Comprehensive metabolic panel  Tomorrow morning,   R     Signed and Held   Signed and Held  Hemoglobin A1c  Once,   R    Comments: To assess prior glycemic control    Signed and Held          Vitals/Pain Today's Vitals   05/09/19 2016 05/09/19 2045 05/09/19 2100 05/09/19 2115  BP: (!) 150/71 (!) 127/104 (!) 144/130 122/81  Pulse: (!) 414   76  Resp: 18 18 (!) 26 18  Temp:      TempSrc:      SpO2: 92%   97%  Weight:      Height:      PainSc:        Isolation Precautions No active isolations  Medications Medications  diltiazem (CARDIZEM) 100 mg in dextrose 5 % 100 mL (1 mg/mL) infusion (7.5 mg/hr Intravenous Rate/Dose Change 05/09/19 1836)  furosemide (LASIX) injection 20 mg (20 mg Intravenous Given 05/09/19 2122)  potassium chloride SA (K-DUR) CR tablet 20 mEq (20 mEq Oral Given 05/09/19 2124)  diltiazem (CARDIZEM) injection 10 mg (10 mg Intravenous Bolus 05/09/19 1800)  ondansetron (ZOFRAN) injection 4 mg (4 mg Intravenous Given 05/09/19 1758)    Mobility manual wheelchair High fall risk   Focused Assessments    R Recommendations: See Admitting Provider Note  Report  given to:   Additional Notes:

## 2019-05-10 ENCOUNTER — Ambulatory Visit: Payer: BC Managed Care – PPO

## 2019-05-10 ENCOUNTER — Inpatient Hospital Stay (HOSPITAL_COMMUNITY): Payer: BC Managed Care – PPO

## 2019-05-10 DIAGNOSIS — I4891 Unspecified atrial fibrillation: Principal | ICD-10-CM

## 2019-05-10 LAB — COMPREHENSIVE METABOLIC PANEL
ALT: 13 U/L (ref 0–44)
AST: 14 U/L — ABNORMAL LOW (ref 15–41)
Albumin: 2 g/dL — ABNORMAL LOW (ref 3.5–5.0)
Alkaline Phosphatase: 88 U/L (ref 38–126)
Anion gap: 10 (ref 5–15)
BUN: 9 mg/dL (ref 8–23)
CO2: 24 mmol/L (ref 22–32)
Calcium: 7.2 mg/dL — ABNORMAL LOW (ref 8.9–10.3)
Chloride: 105 mmol/L (ref 98–111)
Creatinine, Ser: 0.68 mg/dL (ref 0.44–1.00)
GFR calc Af Amer: >60 mL/min (ref >60)
GFR calc non Af Amer: >60 mL/min (ref >60)
Glucose, Bld: 177 mg/dL — ABNORMAL HIGH (ref 70–99)
Potassium: 3.4 mmol/L — ABNORMAL LOW (ref 3.5–5.1)
Sodium: 139 mmol/L (ref 135–145)
Total Bilirubin: 0.8 mg/dL (ref 0.3–1.2)
Total Protein: 4.9 g/dL — ABNORMAL LOW (ref 6.5–8.1)

## 2019-05-10 LAB — CBC
HCT: 30.7 % — ABNORMAL LOW (ref 36.0–46.0)
Hemoglobin: 8.5 g/dL — ABNORMAL LOW (ref 12.0–15.0)
MCH: 25.2 pg — ABNORMAL LOW (ref 26.0–34.0)
MCHC: 27.7 g/dL — ABNORMAL LOW (ref 30.0–36.0)
MCV: 91.1 fL (ref 80.0–100.0)
Platelets: 322 10*3/uL (ref 150–400)
RBC: 3.37 MIL/uL — ABNORMAL LOW (ref 3.87–5.11)
RDW: 16.3 % — ABNORMAL HIGH (ref 11.5–15.5)
WBC: 7.1 10*3/uL (ref 4.0–10.5)
nRBC: 0 % (ref 0.0–0.2)

## 2019-05-10 LAB — GLUCOSE, CAPILLARY
Glucose-Capillary: 31 mg/dL — CL (ref 70–99)
Glucose-Capillary: 37 mg/dL — CL (ref 70–99)
Glucose-Capillary: 73 mg/dL (ref 70–99)
Glucose-Capillary: 151 mg/dL — ABNORMAL HIGH (ref 70–99)
Glucose-Capillary: 62 mg/dL — ABNORMAL LOW (ref 70–99)
Glucose-Capillary: 186 mg/dL — ABNORMAL HIGH (ref 70–99)
Glucose-Capillary: 197 mg/dL — ABNORMAL HIGH (ref 70–99)
Glucose-Capillary: 153 mg/dL — ABNORMAL HIGH (ref 70–99)

## 2019-05-10 LAB — TROPONIN I (HIGH SENSITIVITY)
Troponin I (High Sensitivity): 8 ng/L (ref <18)
Troponin I (High Sensitivity): 9 ng/L (ref <18)

## 2019-05-10 LAB — MRSA PCR SCREENING: MRSA by PCR: POSITIVE — AB

## 2019-05-10 MED ORDER — DEXTROSE 50 % IV SOLN
INTRAVENOUS | Status: AC
  Administered 2019-05-10: 25 mL
  Filled 2019-05-10: qty 50

## 2019-05-10 MED ORDER — POTASSIUM CHLORIDE CRYS ER 20 MEQ PO TBCR
40.0000 meq | EXTENDED_RELEASE_TABLET | Freq: Every day | ORAL | Status: DC
  Administered 2019-05-10 – 2019-05-11 (×2): 40 meq via ORAL
  Filled 2019-05-10 (×3): qty 2

## 2019-05-10 MED ORDER — INSULIN DETEMIR 100 UNIT/ML ~~LOC~~ SOLN
5.0000 [IU] | Freq: Two times a day (BID) | SUBCUTANEOUS | Status: DC
  Filled 2019-05-10 (×3): qty 0.05

## 2019-05-10 MED ORDER — DEXTROSE 50 % IV SOLN
INTRAVENOUS | Status: AC
  Administered 2019-05-10: 50 mL
  Filled 2019-05-10: qty 50

## 2019-05-10 MED ORDER — INSULIN ASPART 100 UNIT/ML ~~LOC~~ SOLN
0.0000 [IU] | SUBCUTANEOUS | Status: DC
  Administered 2019-05-10 (×3): 2 [IU] via SUBCUTANEOUS
  Administered 2019-05-11: 08:00:00 7 [IU] via SUBCUTANEOUS
  Administered 2019-05-11: 05:00:00 3 [IU] via SUBCUTANEOUS

## 2019-05-10 NOTE — TOC Initial Note (Signed)
Transition of Care [facility]) - Initial/Assessment Note    Patient Details  Name: [name] MRN: [id number] Date of Birth: 1950/10/12  Transition of [facility]) CM/SW Contact:    Boneta Lucks, RN Phone Number: 05/10/2019, 4:11 PM  Clinical Narrative:      Patient admitted with AFib with RVR, Patient well know to CM. High risk for readmission. Patient is active with [facility] and [facility].  Patient states RN come out 3 times a week for wound changes and infusions. States Dr Constance Haw was very happy with wound healing. Nothing has changed at home, continues to have support of husband, daughter and grand daughter. Plan for patient to discharge in the am and see PCP in the afternoon.    Expected Discharge Plan: [place]     Patient Goals and CMS Choice        Expected Discharge Plan and Services Expected Discharge Plan: [place]      Prior Living Arrangements/Services   Lives with:: Spouse Patient language and need for interpreter reviewed:: Yes        Need for Family Participation in Patient Care: Yes (Comment) Care giver support system in place?: Yes (comment) Current home services: DME, Home RN Criminal Activity/Legal Involvement Pertinent to Current Situation/Hospitalization: No - Comment as needed  Activities of Daily Living Home Assistive Devices/Equipment: CBG Meter, Eyeglasses, Walker (specify type), Wheelchair, Bedside commode/3-in-1 ADL Screening (condition at time of admission) Patient's cognitive ability adequate to safely complete daily activities?: Yes Is the patient deaf or have difficulty hearing?: No Does the patient have difficulty seeing, even when wearing glasses/contacts?: No Does the patient have difficulty concentrating, remembering, or making decisions?: No Patient able to express need for assistance with ADLs?: Yes Does the patient have difficulty dressing or bathing?: No Independently  performs ADLs?: Yes (appropriate for developmental age) Does the patient have difficulty walking or climbing stairs?: Yes Weakness of Legs: Both Weakness of Arms/Hands: Both  Permission Sought/Granted    Emotional Assessment       Orientation: : Oriented to Self, Oriented to Place, Oriented to  Time, Oriented to Situation Alcohol / Substance Use: Not Applicable Psych Involvement: No (comment)  Admission diagnosis:  Palpitations [R00.2] Patient Active Problem List   Diagnosis Date Noted  . Atrial fibrillation with tachycardic ventricular rate ([place]) 04/28/2019  . Diabetes ([place]) 04/22/2019  . Pyoderma gangrenosum 03/11/2019  . Wound infection 03/09/2019  . Chronic wound infection of abdomen   . Wheelchair bound   . Abdominal wall skin ulcer ([place]) 03/07/2019  . Acute pulmonary edema ([place]) 02/17/2019  . Iron deficiency anemia 02/01/2019  . Abdominal wall ulcer, with fat layer exposed ([place]) 01/16/2019  . Elevated troponin   . Nausea and vomiting   . Symptomatic bradycardia 01/10/2019  . DKA (diabetic ketoacidoses) ([place]) 01/10/2019  . Urinary incontinence 12/28/2018  . Recurrent urinary tract infection 12/16/2018  . Wound, open, anterior abdominal wall 12/05/2018  . Hypotension due to hypovolemia   . Gastritis due to nonsteroidal anti-inflammatory drug   . Esophageal dysphagia   . GI bleed 09/03/2018  . Coagulopathy ([place])   . Colitis   . Lower abdominal pain   . Rectal bleeding   . Dehydration 08/16/2018  . Acute on chronic diastolic CHF (congestive heart failure) ([place]) 08/10/2018  . Acute lower UTI 08/10/2018  . Hypokalemia 08/10/2018  . COPD (chronic obstructive pulmonary disease) ([place]) 08/10/2018  . Thyroid lesion 08/10/2018  . Closed fracture of  right ankle 06/10/2018  . Closed fracture of left tibia and fibula with routine healing 04/13/18 06/10/2018  . Atrial fibrillation ([place]) 04/15/2018  . Chronic diastolic HF (heart failure) ([place]) 04/15/2018  . CKD (chronic  kidney disease), stage III ([place]) 04/15/2018  . Closed right fibular fracture 04/15/2018  . Left tibial fracture 04/14/2018  . CHF exacerbation ([place]) 04/10/2018  . SOB (shortness of breath)   . Acute CHF (congestive heart failure) ([place]) 04/01/2018  . Atrial fibrillation with RVR ([place]) 04/01/2018  . [place] 03/11/2016  . Muscle spasms of lower extremity 03/04/2016  . Acute renal insufficiency 02/26/2016  . History of MRSA infection 02/26/2016  . HTN (hypertension) 02/19/2016  . Fracture, femur, distal ([place]) 02/19/2016  . Fall 02/19/2016  . Depression 02/19/2016  . Closed left femoral fracture, initial encounter 02/19/2016  . Femur fracture, left ([place]) 02/19/2016   PCP:  [name], MD Pharmacy:   [place], [place] - [address] [place] [address] Phone: [phone] Fax: [phone]  [id number] [address] [address]: [phone]: [phone]    Social Determinants of Health (SDOH) Interventions    Readmission Risk Interventions Readmission Risk Prevention Plan 05/10/2019 04/30/2019 04/29/2019  Transportation Screening Complete Complete Complete  Medication Review (Press photographer Complete Complete Complete  PCP or Specialist appointment within 3-5 days of discharge Complete Complete Not Complete  PCP/Specialist Appt Not Complete comments Going tomorrow after discharge. - -  HRI or Home Care Consult Complete Complete Complete  SW Recovery Care/Counseling Consult Complete Patient refused Complete  Palliative Care Screening - Not Applicable Not Complete  Skilled Nursing Facility Not Complete Not Applicable Not Complete  Some recent data might be hidden

## 2019-05-10 NOTE — Progress Notes (Signed)
Inpatient Diabetes Program Recommendations  AACE/ADA: New Consensus Statement on Inpatient Glycemic Control (2015)  Target Ranges:  Prepandial:   less than 140 mg/dL      Peak postprandial:   less than 180 mg/dL (1-2 hours)      Critically ill patients:  140 - 180 mg/dL   Lab Results  Component Value Date   GLUCAP 151 (H) 05/10/2019   HGBA1C 9.9 (H) 04/23/2019    Review of Glycemic Control Results for [name], [name] (MRN [id number]) as of 05/10/2019 10:38  Ref. Range 05/10/2019 07:53 05/10/2019 08:23 05/10/2019 11:32 05/10/2019 12:04  Glucose-Capillary Latest Ref Range: 70 - 99 mg/dL 31 (LL) 73 62 (L) 151 (H)   Diabetes history: Type 2 DM Outpatient Diabetes medications: Levemir 10 units BID, Prednisone 60 mg QD Current orders for Inpatient glycemic control: Levemir 5 units BID, Novolog 0-9 units TID Prednisone taper  Inpatient Diabetes Program Recommendations:    Noted hypoglycemia this AM of 31 mg/dL with subsequent changes to insulin orders. In agreement, will follow.   Spoke with patient regarding outpatient diabetes management. Patient reports that for the last week she has experienced severe hypoglycemia in the mornings. Verifies home medications.  Reviewed patient's current A1c of 9.9% up from 7.7%. Explained what a A1c is and what it measures. Also reviewed goal A1c with patient, importance of good glucose control @ home, and blood sugar goals. Reviewed patho of DM, need for insulin, role of pancreas, impact of infection and steroids to glucose trends, target goals, signs and symptoms of hypo vs hyper glycemia, vascular changes and comorbidities.  Patient has a meter and reports checking atleast 2 times per day. She states, "My blood sugar is always low in the mornings and high in the evenings." Her PCP recently discontinued Glipizide because of this issue. Throughout the discussion patient expresses concern with an infection post oral surgery in August. We reviewed how infection  can either increase or decrease glucose trends, as well as how Prednisone can increase post pranidal values, thus explaining increases to glucose in the afternoon. Patient denies eating large amounts of carbohydrates, especially since her oral surgery and teeth extraction. Reviewed when to call MD, especially given hypoglycemia and discussed endocrinology as an option for follow up. Patient is accepting, plans to make appointment with PCP and has no further questions at this time.  Will attach list to DC summary.   Would recommend decreasing Levemir at discharge.   Thanks, Bronson Curb, MSN, RNC-OB Diabetes Coordinator [phone] (8a-5p)

## 2019-05-10 NOTE — Progress Notes (Signed)
Spoke with primary nurse, Lyla Son RN, after repeated Xray done per report PICC tip in SVC  Therefore is good to use . He stated that after a cap change that PICC did give back blood return with positioning the arm up. Instructed Ryan RN that if needed he could change the dressing and pull PICC back only 1cm  To help with blood return due to it being positional .

## 2019-05-10 NOTE — Discharge Instructions (Signed)
[facility]in[facility] [phone]) 1. Dr. Philemon Kingdom 2. Dr. Janie Morning [facility] [phone]) 1. Dr. Delrae Rend [facility] [phone]) 1. Dr. Jacelyn Pi 2. Dr. Anda Kraft [facility] [phone]) 1. Dr. Daneil Dolin [facility] [phone]) [[place] office]  [phone]) [[place] office] 1. Dr. Lenna Sciara Solum 2. Dr. Judithann Sheen [facility]) [phone]) 1. Autumn Hudnall Ronnald Ramp), PA 2. Dr. Amalia Greenhouse 3. Dr. Marsh Dolly. [facility] [phone]) 1. Dr. Glade Lloyd [facility] [phone]) 1. Dr. Orville Govern 2. Dr. Lelon Huh 3. Dr. Jerelene Redden 4. Alwyn Ren, FNP Dr. Carolynn Serve. [facility] in [place] [phone]) Hyperglycemia Hyperglycemia occurs when the level of sugar (glucose) in the blood is too high. Glucose is a type of sugar that provides the body's main source of energy. Certain hormones (insulin and glucagon) control the level of glucose in the blood. Insulin lowers blood glucose, and glucagon increases blood glucose. Hyperglycemia can result from having too little insulin in the bloodstream, or from the body not responding normally to insulin. Hyperglycemia occurs most often in people who have diabetes (diabetes mellitus), but it can happen in people who do not have diabetes. It can develop quickly, and it can be life-threatening if it causes you to become severely dehydrated (diabetic ketoacidosis or hyperglycemic hyperosmolar state). Severe hyperglycemia is a medical emergency. What are the causes? If you have diabetes, hyperglycemia may be caused by:  Diabetes medicine.  Medicines that increase blood glucose or affect your diabetes control.  Not eating enough, or not eating often enough.  Changes in physical activity level.  Being sick  or having an infection. If you have prediabetes or undiagnosed diabetes:  Hyperglycemia may be caused by those conditions. If you do not have diabetes, hyperglycemia may be caused by:  Certain medicines, including steroid medicines, beta-blockers, epinephrine, and thiazide diuretics.  Stress.  Serious illness.  Surgery.  Diseases of the pancreas.  Infection. What increases the risk? Hyperglycemia is more likely to develop in people who have risk factors for diabetes, such as:  Having a family member with diabetes.  Having a gene for type 1 diabetes that is passed from parent to child (inherited).  Living in an area with cold weather conditions.  Exposure to certain viruses.  Certain conditions in which the body's disease-fighting (immune) system attacks itself (autoimmune disorders).  Being overweight or obese.  Having an inactive (sedentary) lifestyle.  Having been diagnosed with insulin resistance.  Having a history of prediabetes, gestational diabetes, or polycystic ovarian syndrome (PCOS).  Being of American-Indian, African-American, Hispanic/Latino, or Asian/Pacific Islander descent. What are the signs or symptoms? Hyperglycemia may not cause any symptoms. If you do have symptoms, they may include early warning signs, such as:  Increased thirst.  Hunger.  Feeling very tired.  Needing to urinate more often than usual.  Blurry vision. Other symptoms may develop if hyperglycemia gets worse, such as:  Dry mouth.  Loss of appetite.  Fruity-smelling breath.  Weakness.  Unexpected or rapid weight gain or weight loss.  Tingling or numbness in the hands or feet.  Headache.  Skin that does not quickly return to normal after being lightly pinched and released (poor skin turgor).  Abdominal pain.  Cuts or bruises that are slow to heal. How is this diagnosed? Hyperglycemia is diagnosed with a blood test to measure your blood glucose level. This blood  test is usually done while you are having symptoms.  Your health care provider may also do a physical exam and review your medical history. You may have more tests to determine the cause of your hyperglycemia, such as:  A fasting blood glucose (FBG) test. You will not be allowed to eat (you will fast) for at least 8 hours before a blood sample is taken.  An A1c (hemoglobin A1c) blood test. This provides information about blood glucose control over the previous 2-3 months.  An oral glucose tolerance test (OGTT). This measures your blood glucose at two times: ? After fasting. This is your baseline blood glucose level. ? Two hours after drinking a beverage that contains glucose. How is this treated? Treatment depends on the cause of your hyperglycemia. Treatment may include:  Taking medicine to regulate your blood glucose levels. If you take insulin or other diabetes medicines, your medicine or dosage may be adjusted.  Lifestyle changes, such as exercising more, eating healthier foods, or losing weight.  Treating an illness or infection, if this caused your hyperglycemia.  Checking your blood glucose more often.  Stopping or reducing steroid medicines, if these caused your hyperglycemia. If your hyperglycemia becomes severe and it results in hyperglycemic hyperosmolar state, you must be hospitalized and given IV fluids. Follow these instructions at home:  General instructions  Take over-the-counter and prescription medicines only as told by your health care provider.  Do not use any products that contain nicotine or tobacco, such as cigarettes and e-cigarettes. If you need help quitting, ask your health care provider.  Limit alcohol intake to no more than 1 drink per day for nonpregnant women and 2 drinks per day for men. One drink equals 12 oz of beer, 5 oz of wine, or 1 oz of hard liquor.  Learn to manage stress. If you need help with this, ask your health care provider.  Keep all  follow-up visits as told by your health care provider. This is important. Eating and drinking   Maintain a healthy weight.  Exercise regularly, as directed by your health care provider.  Stay hydrated, especially when you exercise, get sick, or spend time in hot temperatures.  Eat healthy foods, such as: ? Lean proteins. ? Complex carbohydrates. ? Fresh fruits and vegetables. ? Low-fat dairy products. ? Healthy fats.  Drink enough fluid to keep your urine clear or pale yellow. If you have diabetes:  Make sure you know the symptoms of hyperglycemia.  Follow your diabetes management plan, as told by your health care provider. Make sure you: ? Take your insulin and medicines as directed. ? Follow your exercise plan. ? Follow your meal plan. Eat on time, and do not skip meals. ? Check your blood glucose as often as directed. Make sure to check your blood glucose before and after exercise. If you exercise longer or in a different way than usual, check your blood glucose more often. ? Follow your sick day plan whenever you cannot eat or drink normally. Make this plan in advance with your health care provider.  Share your diabetes management plan with people in your workplace, school, and household.  Check your urine for ketones when you are ill and as told by your health care provider.  Carry a medical alert card or wear medical alert jewelry. Contact a health care provider if:  Your blood glucose is at or above 240 mg/dL (13.3 mmol/L) for 2 days in a row.  You have problems keeping your blood glucose in your target range.  You have frequent episodes  of hyperglycemia. Get help right away if:  You have difficulty breathing.  You have a change in how you think, feel, or act (mental status).  You have nausea or vomiting that does not go away. These symptoms may represent a serious problem that is an emergency. Do not wait to see if the symptoms will go away. Get medical help  right away. Call your local emergency services (911 in the [place] Do not drive yourself to the hospital. Summary  Hyperglycemia occurs when the level of sugar (glucose) in the blood is too high.  Hyperglycemia is diagnosed with a blood test to measure your blood glucose level. This blood test is usually done while you are having symptoms. Your health care provider may also do a physical exam and review your medical history.  If you have diabetes, follow your diabetes management plan as told by your health care provider.  Contact your health care provider if you have problems keeping your blood glucose in your target range. This information is not intended to replace advice given to you by your health care provider. Make sure you discuss any questions you have with your health care provider. Document Released: 01/28/2001 Document Revised: 04/21/2016 Document Reviewed: 04/21/2016 Elsevier Patient Education  [employer].  Hypoglycemia Hypoglycemia occurs when the level of sugar (glucose) in the blood is too low. Hypoglycemia can happen in people who do or do not have diabetes. It can develop quickly, and it can be a medical emergency. For most people with diabetes, a blood glucose level below 70 mg/dL (3.9 mmol/L) is considered hypoglycemia. Glucose is a type of sugar that provides the body's main source of energy. Certain hormones (insulin and glucagon) control the level of glucose in the blood. Insulin lowers blood glucose, and glucagon raises blood glucose. Hypoglycemia can result from having too much insulin in the bloodstream, or from not eating enough food that contains glucose. You may also have reactive hypoglycemia, which happens within 4 hours after eating a meal. What are the causes? Hypoglycemia occurs most often in people who have diabetes and may be caused by:  Diabetes medicine.  Not eating enough, or not eating often enough.  Increased physical activity.  Drinking  alcohol on an empty stomach. If you do not have diabetes, hypoglycemia may be caused by:  A tumor in the pancreas.  Not eating enough, or not eating for long periods at a time (fasting).  A severe infection or illness.  Certain medicines. What increases the risk? Hypoglycemia is more likely to develop in:  People who have diabetes and take medicines to lower blood glucose.  People who abuse alcohol.  People who have a severe illness. What are the signs or symptoms? Mild symptoms Mild hypoglycemia may not cause any symptoms. If you do have symptoms, they may include:  Hunger.  Anxiety.  Sweating and feeling clammy.  Dizziness or feeling light-headed.  Sleepiness.  Nausea.  Increased heart rate.  Headache.  Blurry vision.  Irritability.  Tingling or numbness around the mouth, lips, or tongue.  A change in coordination.  Restless sleep. Moderate symptoms Moderate hypoglycemia can cause:  Mental confusion and poor judgment.  Behavior changes.  Weakness.  Irregular heartbeat. Severe symptoms Severe hypoglycemia is a medical emergency. It can cause:  Fainting.  Seizures.  Loss of consciousness (coma).  Death. How is this diagnosed? Hypoglycemia is diagnosed with a blood test to measure your blood glucose level. This blood test is done while you are having symptoms.  Your health care provider may also do a physical exam and review your medical history. How is this treated? This condition can often be treated by immediately eating or drinking something that contains sugar, such as:  Fruit juice, 4-6 oz (120-150 mL).  Regular soda (not diet soda), 4-6 oz (120-150 mL).  Low-fat milk, 4 oz (120 mL).  Several pieces of hard candy.  Sugar or honey, 1 Tbsp (15 mL). Treating hypoglycemia if you have diabetes If you are alert and able to swallow safely, follow the 15:15 rule:  Take 15 grams of a rapid-acting carbohydrate. Talk with your health care  provider about how much you should take.  Rapid-acting options include: ? Glucose pills (take 15 grams). ? 6-8 pieces of hard candy. ? 4-6 oz (120-150 mL) of fruit juice. ? 4-6 oz (120-150 mL) of regular (not diet) soda. ? 1 Tbsp (15 mL) honey or sugar.  Check your blood glucose 15 minutes after you take the carbohydrate.  If the repeat blood glucose level is still at or below 70 mg/dL (3.9 mmol/L), take 15 grams of a carbohydrate again.  If your blood glucose level does not increase above 70 mg/dL (3.9 mmol/L) after 3 tries, seek emergency medical care.  After your blood glucose level returns to normal, eat a meal or a snack within 1 hour.  Treating severe hypoglycemia Severe hypoglycemia is when your blood glucose level is at or below 54 mg/dL (3 mmol/L). Severe hypoglycemia is a medical emergency. Get medical help right away. If you have severe hypoglycemia and you cannot eat or drink, you may need an injection of glucagon. A family member or close friend should learn how to check your blood glucose and how to give you a glucagon injection. Ask your health care provider if you need to have an emergency glucagon injection kit available. Severe hypoglycemia may need to be treated in a hospital. The treatment may include getting glucose through an IV. You may also need treatment for the cause of your hypoglycemia. Follow these instructions at home:  General instructions  Take over-the-counter and prescription medicines only as told by your health care provider.  Monitor your blood glucose as told by your health care provider.  Limit alcohol intake to no more than 1 drink a day for nonpregnant women and 2 drinks a day for men. One drink equals 12 oz of beer (355 mL), 5 oz of wine (148 mL), or 1 oz of hard liquor (44 mL).  Keep all follow-up visits as told by your health care provider. This is important. If you have diabetes:  Always have a rapid-acting carbohydrate snack with you to  treat low blood glucose.  Follow your diabetes management plan as directed. Make sure you: ? Know the symptoms of hypoglycemia. It is important to treat it right away to prevent it from becoming severe. ? Take your medicines as directed. ? Follow your exercise plan. ? Follow your meal plan. Eat on time, and do not skip meals. ? Check your blood glucose as often as directed. Always check before and after exercise. ? Follow your sick day plan whenever you cannot eat or drink normally. Make this plan in advance with your health care provider.  Share your diabetes management plan with people in your workplace, school, and household.  Check your urine for ketones when you are ill and as told by your health care provider.  Carry a medical alert card or wear medical alert jewelry. Contact a health care  provider if:  You have problems keeping your blood glucose in your target range.  You have frequent episodes of hypoglycemia. Get help right away if:  You continue to have hypoglycemia symptoms after eating or drinking something containing glucose.  Your blood glucose is at or below 54 mg/dL (3 mmol/L).  You have a seizure.  You faint. These symptoms may represent a serious problem that is an emergency. Do not wait to see if the symptoms will go away. Get medical help right away. Call your local emergency services (911 in the [place] Summary  Hypoglycemia occurs when the level of sugar (glucose) in the blood is too low.  Hypoglycemia can happen in people who do or do not have diabetes. It can develop quickly, and it can be a medical emergency.  Make sure you know the symptoms of hypoglycemia and how to treat it.  Always have a rapid-acting carbohydrate snack with you to treat low blood sugar. This information is not intended to replace advice given to you by your health care provider. Make sure you discuss any questions you have with your health care provider. Document Released:  08/04/2005 Document Revised: 01/25/2018 Document Reviewed: 09/07/2015 Elsevier Patient Education  2020 [employer].

## 2019-05-10 NOTE — Progress Notes (Signed)
Initial Nutrition Assessment  DOCUMENTATION CODES:   Obesity unspecified  INTERVENTION:  -Continue Ensure Enlive po BID, each supplement provides 350 kcal and 20 grams of protein  -Juven BID, each packet provides 95 calories, 2.5 grams of protein (collagen), and 9.8 grams of carbohydrate (3 grams sugar); also contains 7 grams of L-arginine and L-glutamine, 300 mg vitamin C, 15 mg vitamin E, 1.2 mcg vitamin B-12, 9.5 mg zinc, 200 mg calcium, and 1.5 g  Calcium Beta-hydroxy-Beta-methylbutyrate to support wound healing  -MVI   NUTRITION DIAGNOSIS:   Increased nutrient needs related to wound healing(chronic abdominal wound) as evidenced by estimated needs.  GOAL:   Patient will meet greater than or equal to 90% of their needs   MONITOR:   PO intake, Labs, I & O's, Weight trends, Supplement acceptance  REASON FOR ASSESSMENT:   Malnutrition Screening Tool    ASSESSMENT:  RD working remotely.   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 Patient presented to ED with complaints of high blood sugar, SOB, nausea and poor oral intake  9/21 PICC exchange  Per general surgery chart review, wound is healing and looks healthy; noted patient has been at home and not taking medications regularly. Prednisone discontinued; follow up in clinic scheduled 10/13  Patient seen by Diabetes Coordinator. Patient reported experiencing severe hypoglycemia in the mornings for the past week. Patient denied eating large amounts of carbohydrates s/p oral surgery and teeth extraction in August. Currently receiving HH/CM therapeutic diet; no recorded meals at this time. Will continue to monitor for po intake.   Current wt 89.8 kg (197.6 lb) Weight history reviewed; noted 30 lb wt loss over the past 3 months (13%;  severe for time frame) Per [facility] evaluation notes from last 9/12 admission, pt wt loss attributed to lasix regimen.   Medications reviewed and include: Ensure, ferrous sulfate, SS novolog, protonix, k-dur, cardizem, invanz  Labs: Potassium 3.4 - replacing Ca corrected for low albumin 8.8 (L) Hgb 8.5 - trending down CBGS 31-151  NUTRITION - FOCUSED PHYSICAL EXAM: Unable to complete at this time; RD working remotely.   Diet Order:   Diet Order            Diet heart healthy/carb modified Room service appropriate? Yes; Fluid consistency: Thin  Diet effective now              EDUCATION NEEDS:   No education needs have been identified at this time  Skin:  Skin Assessment: Reviewed RN Assessment  Last BM:  9/21  Height:   Ht Readings from Last 1 Encounters:  05/09/19 5' 4"  (1.626 m)    Weight:   Wt Readings from Last 1 Encounters:  05/09/19 89.8 kg    Ideal Body Weight:  54.5 kg  BMI:  Body mass index is 33.99 kg/m.  Estimated Nutritional Needs:   Kcal:  1850-2000  Protein:  87-98 (1.6-1.8 g/kg/IBW)  Fluid:  >/=2L  Lajuan Lines, RD, LDN [facility] Telephone [phone] After Hours/Weekend Pager: [phone]

## 2019-05-10 NOTE — Progress Notes (Signed)
Spoke with primary RN about PICC ,He stated that the PICC was not out at the insertion site . As I look at the Xray that was done yesterday it is difficult to see the tip of the PICC ,however I am able to see it further than the axilla . Recommendations would to have a repeat Xray done this morning and see if we can get a better picture. If PICC is still in the SVC then TPA can be instilled in order to get blood return. Will follow up .

## 2019-05-10 NOTE — Progress Notes (Signed)
[facility]  Patient known to me with abdominal wound. Looks healthy and no drainage or erythema. The wound grew out ESBL E coli and patient later with signs of bacteremia but cultures obtained after antibiotics were given.  She was discharged as if had bacteremia.   Patient has been at home and not taking medications regularly but also having swings in her BS and HR.  Home Health Rn notified me yesterday, and patient went to the ED.    Continue wet to dry packing BID.  Wound is healing and looks healthy. Agree with stopping the prednisone/ patient has not been taking it as she does not appear adrenally insufficient at this time.  Can follow up in clinic in 2-3 weeks for wound check (will change appt from this Thursday 9/24 to 10/13).  Discussed with Dr. Manuella Ghazi.   Curlene Labrum, MD [facility] [address] [place], [place] [phone] (office)

## 2019-05-10 NOTE — Progress Notes (Signed)
PROGRESS NOTE    [name]  QQV:[id number] DOB: June 23, 1951 DOA: 05/09/2019 PCP: [name], MD   Brief Narrative:  Per HPI: [name]  is a 68 y.o. female, with history of atrial fibrillation on anticoagulation therapy with apixaban, diabetes mellitus type 2, LV thrombus, hypertension, depression, abdominal wound infection,?  Pyoderma gangrenosum, currently on IV Invanz.  Patient was recently discharged from the hospital on 04/30/2019 when she was admitted with abdominal wound infection and was discharged on Invanz for 21 days.  As per patient she also has been taking high-dose prednisone which was prescribed by general surgery on 03/09/2019 after she had debridement of the abdominal wound and biopsy came back?  Pyoderma gangrenosum.  As per patient, she noticed that her blood sugar has been very high at home she takes 60 mg of prednisone daily.  She was not discharged on prednisone on 04/30/2019 as per discharge summary.  She denies chest pain but complains of palpitations. Admits to having shortness of breath. Complains of nausea but denies vomiting and diarrhea. Denies abdominal pain or dysuria.  In the ED patient started on IV Cardizem infusion for atrial fibrillation with RVR.  9/22: Patient noted to have significant hypoglycemia.  She is having very poor oral intake overall.  Heart rate appears to be better controlled.   Assessment & Plan:   Active Problems:   Atrial fibrillation with RVR (HCC)  Atrial fibrillation with RVR -Wean off IV Cardizem drip -Continue Eliquis for anticoagulation -Continue metoprolol p.o. 50 mg twice daily -Continue telemetry monitoring  Mild hypokalemia -Recheck in a.m. along with magnesium -Likely secondary to Lasix use which will be discontinued on account of poor oral intake at this point  Chronic abdominal wound with pyoderma gangrenosum -Continue IV Invanz as previously prescribed -PICC line exchange and check chest  x-ray  Type 2 diabetes with labile blood glucose readings -Continue on SSI as needed with Accu-Cheks every 4 hours -Hold Levemir at this time -Continue carb modified diet -We will likely need insulin pump fairly soon with endocrinology appointment -Appreciate diabetes coordinator  Acute on chronic diastolic heart failure -Does not appear to be significantly volume overloaded at this point -Due to hypokalemia and poor oral intake we will hold further Lasix at this time -Likely resume Lasix in a.m. and monitor carefully  DVT prophylaxis:Apixaban Code Status: Full Family Communication: None at bedside Disposition Plan: Continue current treatments and hold further Levemir as well as Lasix as patient has poor oral intake.  Wean Cardizem drip to off.  Hopeful discharge in the next 24 to 48 hours if blood glucose remains stable and patient eating.  She does have endocrinology appointment tomorrow which would be good to keep.   Consultants:   None  Procedures:   None  Antimicrobials:  Anti-infectives (From admission, onward)   Start     Dose/Rate Route Frequency Ordered Stop   05/10/19 1300  ertapenem (INVANZ) 1,000 mg in sodium chloride 0.9 % 100 mL IVPB     1 g 200 mL/hr over 30 Minutes Intravenous Every 24 hours 05/09/19 2201         Subjective: Patient seen and evaluated today some ongoing nausea.  Heart rate is better controlled.  She is having fluctuating blood glucose levels.  Objective: Vitals:   05/10/19 1100 05/10/19 1145 05/10/19 1200 05/10/19 1215  BP: 122/61 112/84 115/72 (!) 133/91  Pulse: 72 68 77 64  Resp: 14 13 14 11   Temp:      TempSrc:  SpO2: 95% 94% 96% 94%  Weight:      Height:        Intake/Output Summary (Last 24 hours) at 05/10/2019 1255 Last data filed at 05/10/2019 1200 Gross per 24 hour  Intake 132 ml  Output 600 ml  Net -468 ml   Filed Weights   05/09/19 1708  Weight: 89.8 kg    Examination:  General exam: Appears calm and  comfortable  Respiratory system: Clear to auscultation. Respiratory effort normal. Cardiovascular system: S1 & S2 heard, irregular. No JVD, murmurs, rubs, gallops or clicks. No pedal edema. Gastrointestinal system: Abdomen is nondistended, soft and nontender. No organomegaly or masses felt. Normal bowel sounds heard. Central nervous system: Alert and oriented. No focal neurological deficits. Extremities: Symmetric 5 x 5 power. Skin: No rashes, lesions or ulcers Psychiatry: Judgement and insight appear normal. Mood & affect appropriate.     Data Reviewed: I have personally reviewed following labs and imaging studies  CBC: Recent Labs  Lab 05/09/19 1744 05/10/19 0249  WBC 7.7 7.1  NEUTROABS 6.0  --   HGB 8.8* 8.5*  HCT 31.4* 30.7*  MCV 89.5 91.1  PLT 343 409   Basic Metabolic Panel: Recent Labs  Lab 05/09/19 1744 05/10/19 0249  NA 140 139  K 3.4* 3.4*  CL 105 105  CO2 26 24  GLUCOSE 193* 177*  BUN 9 9  CREATININE 0.63 0.68  CALCIUM 7.5* 7.2*   GFR: Estimated Creatinine Clearance: 73 mL/min (by C-G formula based on SCr of 0.68 mg/dL). Liver Function Tests: Recent Labs  Lab 05/09/19 1744 05/10/19 0249  AST 13* 14*  ALT 15 13  ALKPHOS 94 88  BILITOT 0.9 0.8  PROT 5.3* 4.9*  ALBUMIN 2.1* 2.0*   No results for input(s): LIPASE, AMYLASE in the last 168 hours. No results for input(s): AMMONIA in the last 168 hours. Coagulation Profile: No results for input(s): INR, PROTIME in the last 168 hours. Cardiac Enzymes: No results for input(s): CKTOTAL, CKMB, CKMBINDEX, TROPONINI in the last 168 hours. BNP (last 3 results) No results for input(s): PROBNP in the last 8760 hours. HbA1C: No results for input(s): HGBA1C in the last 72 hours. CBG: Recent Labs  Lab 05/10/19 0749 05/10/19 0753 05/10/19 0823 05/10/19 1132 05/10/19 1204  GLUCAP 37* 31* 73 62* 151*   Lipid Profile: No results for input(s): CHOL, HDL, LDLCALC, TRIG, CHOLHDL, LDLDIRECT in the last 72  hours. Thyroid Function Tests: No results for input(s): TSH, T4TOTAL, FREET4, T3FREE, THYROIDAB in the last 72 hours. Anemia Panel: No results for input(s): VITAMINB12, FOLATE, FERRITIN, TIBC, IRON, RETICCTPCT in the last 72 hours. Sepsis Labs: No results for input(s): PROCALCITON, LATICACIDVEN in the last 168 hours.  Recent Results (from the past 240 hour(s))  SARS Coronavirus 2 [facility] order, Performed in [facility] hospital lab) Nasopharyngeal Nasopharyngeal Swab     Status: None   Collection Time: 05/09/19  5:37 PM   Specimen: Nasopharyngeal Swab  Result Value Ref Range Status   SARS Coronavirus 2 NEGATIVE NEGATIVE Final    Comment: (NOTE) If result is NEGATIVE SARS-CoV-2 target nucleic acids are NOT DETECTED. The SARS-CoV-2 RNA is generally detectable in upper and lower  respiratory specimens during the acute phase of infection. The lowest  concentration of SARS-CoV-2 viral copies this assay can detect is 250  copies / mL. A negative result does not preclude SARS-CoV-2 infection  and should not be used as the sole basis for treatment or other  patient management decisions.  A negative  result may occur with  improper specimen collection / handling, submission of specimen other  than nasopharyngeal swab, presence of viral mutation(s) within the  areas targeted by this assay, and inadequate number of viral copies  (<250 copies / mL). A negative result must be combined with clinical  observations, patient history, and epidemiological information. If result is POSITIVE SARS-CoV-2 target nucleic acids are DETECTED. The SARS-CoV-2 RNA is generally detectable in upper and lower  respiratory specimens dur ing the acute phase of infection.  Positive  results are indicative of active infection with SARS-CoV-2.  Clinical  correlation with patient history and other diagnostic information is  necessary to determine patient infection status.  Positive results do  not rule out bacterial  infection or co-infection with other viruses. If result is PRESUMPTIVE POSTIVE SARS-CoV-2 nucleic acids MAY BE PRESENT.   A presumptive positive result was obtained on the submitted specimen  and confirmed on repeat testing.  While 2019 novel coronavirus  (SARS-CoV-2) nucleic acids may be present in the submitted sample  additional confirmatory testing may be necessary for epidemiological  and / or clinical management purposes  to differentiate between  SARS-CoV-2 and other Sarbecovirus currently known to infect humans.  If clinically indicated additional testing with an alternate test  methodology [phone]) is advised. The SARS-CoV-2 RNA is generally  detectable in upper and lower respiratory sp ecimens during the acute  phase of infection. The expected result is Negative. Fact Sheet for Patients:  StrictlyIdeas.no Fact Sheet for Healthcare Providers: BankingDealers.co.za This test is not yet approved or cleared by the [place] FDA and has been authorized for detection and/or diagnosis of SARS-CoV-2 by FDA under an Emergency Use Authorization (EUA).  This EUA will remain in effect (meaning this test can be used) for the duration of the COVID-19 declaration under Section 564(b)(1) of the Act, 21 U.S.C. section 360bbb-3(b)(1), unless the authorization is terminated or revoked sooner. Performed at [facility], [address], [place], [place] [address]   MRSA PCR Screening     Status: Abnormal   Collection Time: 05/09/19 10:04 PM   Specimen: Nasal Mucosa; Nasopharyngeal  Result Value Ref Range Status   MRSA by PCR POSITIVE (A) NEGATIVE Final    Comment:        The GeneXpert MRSA Assay (FDA approved for NASAL specimens only), is one component of a comprehensive MRSA colonization surveillance program. It is not intended to diagnose MRSA infection nor to guide or monitor treatment for MRSA infections. RESULT CALLED TO, READ BACK  BY AND VERIFIED WITH: Nicholes Rough @0529  05/10/19 [facility] Performed at [facility], [address] [place], [place] [address]          Radiology Studies: Dg Chest [facility] 1 View  Result Date: 05/10/2019 CLINICAL DATA:  PICC placement reassessment. EXAM: PORTABLE CHEST 1 VIEW COMPARISON:  05/09/2019. FINDINGS: PICC line noted with tip over superior vena cava on today's exam. This is in good anatomic position. Stable cardiomegaly. Low lung volumes with mild bibasilar atelectasis. IMPRESSION: 1. PICC line now in noted with tip projected over superior vena cava in good anatomic position. 2.  Stable cardiomegaly. 3.  Low lung volumes.  Mild bibasilar atelectasis. Electronically Signed   By: Marcello Moores  Register   On: 05/10/2019 09:52   Dg Chest Portable 1 View  Result Date: 05/09/2019 CLINICAL DATA:  Nausea and headache. EXAM: PORTABLE CHEST 1 VIEW COMPARISON:  Single-view of the chest 04/29/2019 and 02/17/2019. CT chest 04/22/2019. FINDINGS: The lungs are clear. Heart size  is upper normal. No pneumothorax or pleural fluid. No acute or focal bony abnormality. Right PICC is backed out with its tip now projecting in the right axilla. IMPRESSION: No acute disease. The patient's right PICC is backed out with its tip now projecting in the right axilla. Electronically Signed   By: Inge Rise M.D.   On: 05/09/2019 18:44        Scheduled Meds: . apixaban  5 mg Oral BID  . buPROPion  300 mg Oral Daily  . Chlorhexidine Gluconate Cloth  6 each Topical Daily  . escitalopram  10 mg Oral Daily  . feeding supplement (ENSURE ENLIVE)  237 mL Oral BID BM  . ferrous sulfate  325 mg Oral BID WC  . insulin aspart  0-9 Units Subcutaneous Q4H  . metoprolol tartrate  50 mg Oral BID  . pantoprazole  40 mg Oral QAC breakfast  . potassium chloride  40 mEq Oral Daily  . sodium chloride flush  3 mL Intravenous Q12H   Continuous Infusions: . sodium chloride    . diltiazem (CARDIZEM) infusion 5 mg/hr (05/10/19  1200)  . ertapenem       LOS: 1 day    Time spent: 30 minutes    Jiovani Mccammon Darleen Crocker, DO [facility] Pager [phone]  If 7PM-7AM, please contact night-coverage [email or web address] Password [facility] 05/10/2019, 12:55 PM

## 2019-05-11 LAB — GLUCOSE, CAPILLARY
Glucose-Capillary: 102 mg/dL — ABNORMAL HIGH (ref 70–99)
Glucose-Capillary: 217 mg/dL — ABNORMAL HIGH (ref 70–99)
Glucose-Capillary: 312 mg/dL — ABNORMAL HIGH (ref 70–99)
Glucose-Capillary: 75 mg/dL (ref 70–99)
Glucose-Capillary: 76 mg/dL (ref 70–99)
Glucose-Capillary: 87 mg/dL (ref 70–99)

## 2019-05-11 LAB — CBC
HCT: 29.4 % — ABNORMAL LOW (ref 36.0–46.0)
Hemoglobin: 8.2 g/dL — ABNORMAL LOW (ref 12.0–15.0)
MCH: 24.8 pg — ABNORMAL LOW (ref 26.0–34.0)
MCHC: 27.9 g/dL — ABNORMAL LOW (ref 30.0–36.0)
MCV: 89.1 fL (ref 80.0–100.0)
Platelets: 338 10*3/uL (ref 150–400)
RBC: 3.3 MIL/uL — ABNORMAL LOW (ref 3.87–5.11)
RDW: 16.2 % — ABNORMAL HIGH (ref 11.5–15.5)
WBC: 5.9 10*3/uL (ref 4.0–10.5)
nRBC: 0 % (ref 0.0–0.2)

## 2019-05-11 LAB — HEMOGLOBIN A1C
Hgb A1c MFr Bld: 8.8 % — ABNORMAL HIGH (ref 4.8–5.6)
Mean Plasma Glucose: 206 mg/dL

## 2019-05-11 LAB — BASIC METABOLIC PANEL
Anion gap: 7 (ref 5–15)
BUN: 12 mg/dL (ref 8–23)
CO2: 28 mmol/L (ref 22–32)
Calcium: 7.3 mg/dL — ABNORMAL LOW (ref 8.9–10.3)
Chloride: 104 mmol/L (ref 98–111)
Creatinine, Ser: 0.7 mg/dL (ref 0.44–1.00)
GFR calc Af Amer: >60 mL/min (ref >60)
GFR calc non Af Amer: >60 mL/min (ref >60)
Glucose, Bld: 279 mg/dL — ABNORMAL HIGH (ref 70–99)
Potassium: 3.9 mmol/L (ref 3.5–5.1)
Sodium: 139 mmol/L (ref 135–145)

## 2019-05-11 LAB — MAGNESIUM: Magnesium: 1.2 mg/dL — ABNORMAL LOW (ref 1.7–2.4)

## 2019-05-11 MED ORDER — MAGNESIUM SULFATE 2 GM/50ML IV SOLN
2.0000 g | Freq: Once | INTRAVENOUS | Status: AC
  Administered 2019-05-11: 09:00:00 2 g via INTRAVENOUS
  Filled 2019-05-11: qty 50

## 2019-05-11 MED ORDER — FUROSEMIDE 40 MG PO TABS
40.0000 mg | ORAL_TABLET | Freq: Two times a day (BID) | ORAL | Status: DC
  Administered 2019-05-11 (×2): 40 mg via ORAL
  Filled 2019-05-11 (×3): qty 1

## 2019-05-11 MED ORDER — DILTIAZEM HCL ER COATED BEADS 120 MG PO CP24
120.0000 mg | ORAL_CAPSULE | Freq: Every day | ORAL | Status: DC
  Administered 2019-05-11 – 2019-05-25 (×15): 120 mg via ORAL
  Filled 2019-05-11 (×15): qty 1

## 2019-05-11 MED ORDER — INSULIN DETEMIR 100 UNIT/ML ~~LOC~~ SOLN
5.0000 [IU] | Freq: Two times a day (BID) | SUBCUTANEOUS | Status: DC
  Administered 2019-05-11 (×2): 5 [IU] via SUBCUTANEOUS
  Filled 2019-05-11 (×3): qty 0.05

## 2019-05-11 NOTE — Progress Notes (Signed)
PROGRESS NOTE    [name]  ZOX:[id number] DOB: 1951-05-24 DOA: 05/09/2019 PCP: [name], MD   Brief Narrative:  Per HPI: [name] a68 y.o.female,with history of atrial fibrillation on anticoagulation therapy with apixaban, diabetes mellitus type 2, LV thrombus, hypertension, depression, abdominal wound infection,? Pyoderma gangrenosum, currently on IV Invanz. Patient was recently discharged from the hospital on 04/30/2019 when she was admitted with abdominal wound infection and was discharged on Invanz for 21 days. As per patient she also has been taking high-dose prednisone which was prescribed by general surgery on 03/09/2019 after she had debridement of the abdominal wound and biopsy came back? Pyoderma gangrenosum.  As per patient, she noticed that her blood sugar has been very high at home she takes 60 mg of prednisone daily. She was not discharged on prednisone on 04/30/2019 as per discharge summary.  She denies chest pain but complains of palpitations. Admits to having shortness of breath. Complains of nausea but denies vomiting and diarrhea. Denies abdominal pain or dysuria.  In the ED patient started on IV Cardizem infusion for atrial fibrillation with RVR.  9/22: Patient noted to have significant hypoglycemia.  She is having very poor oral intake overall.  Heart rate appears to be better controlled.  9/23: Hypoglycemia has improved as well as heart rate.  She still is on minimal dose of IV Cardizem which will not be discontinued and hopefully, patient can be transferred to telemetry floor today.  She is noted to have some mild delirium as well.   Assessment & Plan:   Active Problems:   Atrial fibrillation with RVR (HCC)   Atrial fibrillation with RVR-resolved -Wean off IV Cardizem drip today and transfer to telemetry -Restart home oral Cardizem as well today -Continue Eliquis for anticoagulation -Continue metoprolol p.o. 50 mg twice  daily -Continue telemetry monitoring  Mild delirium -Continue to monitor it is likely due to current hospitalization -We will transfer to telemetry today and hopefully this will help  Mild hypokalemia-improved -Continue monitoring in a.m. and continue oral supplementation as Lasix has been restarted -She is noted to be hypomagnesemic and will supplement with IV magnesium sulfate today and recheck in a.m.  Chronic abdominal wound with pyoderma gangrenosum -Continue IV Invanz as previously prescribed -PICC line did not need to be exchanged as there was simply a positional problem -Appreciate general surgery recommendations and will continue wound dressings and changes  Type 2 diabetes with labile blood glucose readings-improved -Continue on SSI as needed with Accu-Cheks every 4 hours -Continue carb modified diet -Appreciate diabetes coordinator recommendations; A1c noted to be 8.8% -We will need endocrinology outpatient follow-up soon for insulin pump likely -Patient is tolerating diet now with higher blood glucose readings and will resume Levemir at 50% of home dose of 5 units twice daily for now  Acute on chronic diastolic heart failure -Mild shortness of breath noted, but fluid balance still negative -Resume home oral Lasix for now  DVT prophylaxis:Apixaban Code Status: Full Family Communication: None at bedside Disposition Plan:  Wean off Cardizem and resume home Levemir at half of prior dose as patient is now tolerating diet.  Continue to monitor on telemetry and transfer to telemetry floor today.  Anticipate discharge in the next 24-48 hours if delirium is improved and patient is stable in regards to blood glucose and heart rate.   Consultants:   General surgery peripherally following  Procedures:   None  Antimicrobials:  Anti-infectives (From admission, onward)   Start     Dose/Rate  Route Frequency Ordered Stop   05/10/19 1300  ertapenem (INVANZ) 1,000 mg in  sodium chloride 0.9 % 100 mL IVPB     1 g 200 mL/hr over 30 Minutes Intravenous Every 24 hours 05/09/19 2201         Subjective: Patient seen and evaluated today with no new acute complaints or concerns. No acute concerns or events noted overnight.  Her blood glucose levels have improved as well as her heart rate which remains in the 70-80 bpm range.  She is exhibiting some mild confusion and states that she has an appointment that she needs to go to today.  Objective: Vitals:   05/11/19 0300 05/11/19 0400 05/11/19 0500 05/11/19 0600  BP:  133/70 123/71 134/63  Pulse: 84 79    Resp: 17 13 16 17   Temp:  97.7 F (36.5 C)    TempSrc:  Oral    SpO2: 99% 98%    Weight:   95.1 kg   Height:        Intake/Output Summary (Last 24 hours) at 05/11/2019 0750 Last data filed at 05/10/2019 1900 Gross per 24 hour  Intake 267.65 ml  Output 700 ml  Net -432.35 ml   Filed Weights   05/09/19 1708 05/11/19 0500  Weight: 89.8 kg 95.1 kg    Examination:  General exam: Appears calm and comfortable  Respiratory system: Clear to auscultation. Respiratory effort normal.  Currently on room air. Cardiovascular system: S1 & S2 heard, RRR. No JVD, murmurs, rubs, gallops or clicks. No pedal edema. Gastrointestinal system: Abdomen is nondistended, soft and nontender. No organomegaly or masses felt. Normal bowel sounds heard.  Abdominal wound is packed, clean, dry, and intact. Central nervous system: Alert and oriented. No focal neurological deficits.  Minimally confused Extremities: Symmetric 5 x 5 power. Skin: No rashes, lesions or ulcers Psychiatry: Minimal confusion today.    Data Reviewed: I have personally reviewed following labs and imaging studies  CBC: Recent Labs  Lab 05/09/19 1744 05/10/19 0249 05/11/19 0648  WBC 7.7 7.1 5.9  NEUTROABS 6.0  --   --   HGB 8.8* 8.5* 8.2*  HCT 31.4* 30.7* 29.4*  MCV 89.5 91.1 89.1  PLT 343 322 300   Basic Metabolic Panel: Recent Labs  Lab  05/09/19 1744 05/10/19 0249 05/11/19 0648  NA 140 139 139  K 3.4* 3.4* 3.9  CL 105 105 104  CO2 26 24 28   GLUCOSE 193* 177* 279*  BUN 9 9 12   CREATININE 0.63 0.68 0.70  CALCIUM 7.5* 7.2* 7.3*  MG  --   --  1.2*   GFR: Estimated Creatinine Clearance: 75.3 mL/min (by C-G formula based on SCr of 0.7 mg/dL). Liver Function Tests: Recent Labs  Lab 05/09/19 1744 05/10/19 0249  AST 13* 14*  ALT 15 13  ALKPHOS 94 88  BILITOT 0.9 0.8  PROT 5.3* 4.9*  ALBUMIN 2.1* 2.0*   No results for input(s): LIPASE, AMYLASE in the last 168 hours. No results for input(s): AMMONIA in the last 168 hours. Coagulation Profile: No results for input(s): INR, PROTIME in the last 168 hours. Cardiac Enzymes: No results for input(s): CKTOTAL, CKMB, CKMBINDEX, TROPONINI in the last 168 hours. BNP (last 3 results) No results for input(s): PROBNP in the last 8760 hours. HbA1C: Recent Labs    05/09/19 2011  HGBA1C 8.8*   CBG: Recent Labs  Lab 05/10/19 1631 05/10/19 2024 05/10/19 2321 05/11/19 0342 05/11/19 0747  GLUCAP 186* 197* 153* 217* 312*   Lipid Profile:  No results for input(s): CHOL, HDL, LDLCALC, TRIG, CHOLHDL, LDLDIRECT in the last 72 hours. Thyroid Function Tests: No results for input(s): TSH, T4TOTAL, FREET4, T3FREE, THYROIDAB in the last 72 hours. Anemia Panel: No results for input(s): VITAMINB12, FOLATE, FERRITIN, TIBC, IRON, RETICCTPCT in the last 72 hours. Sepsis Labs: No results for input(s): PROCALCITON, LATICACIDVEN in the last 168 hours.  Recent Results (from the past 240 hour(s))  SARS Coronavirus 2 [facility] order, Performed in [facility] hospital lab) Nasopharyngeal Nasopharyngeal Swab     Status: None   Collection Time: 05/09/19  5:37 PM   Specimen: Nasopharyngeal Swab  Result Value Ref Range Status   SARS Coronavirus 2 NEGATIVE NEGATIVE Final    Comment: (NOTE) If result is NEGATIVE SARS-CoV-2 target nucleic acids are NOT DETECTED. The SARS-CoV-2 RNA is  generally detectable in upper and lower  respiratory specimens during the acute phase of infection. The lowest  concentration of SARS-CoV-2 viral copies this assay can detect is 250  copies / mL. A negative result does not preclude SARS-CoV-2 infection  and should not be used as the sole basis for treatment or other  patient management decisions.  A negative result may occur with  improper specimen collection / handling, submission of specimen other  than nasopharyngeal swab, presence of viral mutation(s) within the  areas targeted by this assay, and inadequate number of viral copies  (<250 copies / mL). A negative result must be combined with clinical  observations, patient history, and epidemiological information. If result is POSITIVE SARS-CoV-2 target nucleic acids are DETECTED. The SARS-CoV-2 RNA is generally detectable in upper and lower  respiratory specimens dur ing the acute phase of infection.  Positive  results are indicative of active infection with SARS-CoV-2.  Clinical  correlation with patient history and other diagnostic information is  necessary to determine patient infection status.  Positive results do  not rule out bacterial infection or co-infection with other viruses. If result is PRESUMPTIVE POSTIVE SARS-CoV-2 nucleic acids MAY BE PRESENT.   A presumptive positive result was obtained on the submitted specimen  and confirmed on repeat testing.  While 2019 novel coronavirus  (SARS-CoV-2) nucleic acids may be present in the submitted sample  additional confirmatory testing may be necessary for epidemiological  and / or clinical management purposes  to differentiate between  SARS-CoV-2 and other Sarbecovirus currently known to infect humans.  If clinically indicated additional testing with an alternate test  methodology [phone]) is advised. The SARS-CoV-2 RNA is generally  detectable in upper and lower respiratory sp ecimens during the acute  phase of  infection. The expected result is Negative. Fact Sheet for Patients:  StrictlyIdeas.no Fact Sheet for Healthcare Providers: BankingDealers.co.za This test is not yet approved or cleared by the [place] FDA and has been authorized for detection and/or diagnosis of SARS-CoV-2 by FDA under an Emergency Use Authorization (EUA).  This EUA will remain in effect (meaning this test can be used) for the duration of the COVID-19 declaration under Section 564(b)(1) of the Act, 21 U.S.C. section 360bbb-3(b)(1), unless the authorization is terminated or revoked sooner. Performed at [facility], [address] [place], [place] [address]   MRSA PCR Screening     Status: Abnormal   Collection Time: 05/09/19 10:04 PM   Specimen: Nasal Mucosa; Nasopharyngeal  Result Value Ref Range Status   MRSA by PCR POSITIVE (A) NEGATIVE Final    Comment:        The GeneXpert MRSA Assay (FDA approved for NASAL specimens  only), is one component of a comprehensive MRSA colonization surveillance program. It is not intended to diagnose MRSA infection nor to guide or monitor treatment for MRSA infections. RESULT CALLED TO, READ BACK BY AND VERIFIED WITH: Nicholes Rough @0529  05/10/19 [facility] Performed at [facility], [address] [place], [place] [address]          Radiology Studies: Dg Chest [facility] 1 View  Result Date: 05/10/2019 CLINICAL DATA:  PICC placement reassessment. EXAM: PORTABLE CHEST 1 VIEW COMPARISON:  05/09/2019. FINDINGS: PICC line noted with tip over superior vena cava on today's exam. This is in good anatomic position. Stable cardiomegaly. Low lung volumes with mild bibasilar atelectasis. IMPRESSION: 1. PICC line now in noted with tip projected over superior vena cava in good anatomic position. 2.  Stable cardiomegaly. 3.  Low lung volumes.  Mild bibasilar atelectasis. Electronically Signed   By: Marcello Moores  Register   On: 05/10/2019 09:52   Dg  Chest Portable 1 View  Result Date: 05/09/2019 CLINICAL DATA:  Nausea and headache. EXAM: PORTABLE CHEST 1 VIEW COMPARISON:  Single-view of the chest 04/29/2019 and 02/17/2019. CT chest 04/22/2019. FINDINGS: The lungs are clear. Heart size is upper normal. No pneumothorax or pleural fluid. No acute or focal bony abnormality. Right PICC is backed out with its tip now projecting in the right axilla. IMPRESSION: No acute disease. The patient's right PICC is backed out with its tip now projecting in the right axilla. Electronically Signed   By: Inge Rise M.D.   On: 05/09/2019 18:44        Scheduled Meds:  apixaban  5 mg Oral BID   buPROPion  300 mg Oral Daily   Chlorhexidine Gluconate Cloth  6 each Topical Daily   diltiazem  120 mg Oral Daily   escitalopram  10 mg Oral Daily   feeding supplement (ENSURE ENLIVE)  237 mL Oral BID BM   ferrous sulfate  325 mg Oral BID WC   furosemide  40 mg Oral BID   insulin aspart  0-9 Units Subcutaneous Q4H   insulin detemir  5 Units Subcutaneous Q12H   metoprolol tartrate  50 mg Oral BID   pantoprazole  40 mg Oral QAC breakfast   potassium chloride  40 mEq Oral Daily   sodium chloride flush  3 mL Intravenous Q12H   Continuous Infusions:  sodium chloride     diltiazem (CARDIZEM) infusion Stopped (05/11/19 7711)   ertapenem 1,000 mg (05/10/19 1317)   magnesium sulfate bolus IVPB       LOS: 2 days    Time spent: 30 minutes    Khari Lett Darleen Crocker, DO [facility] Pager [phone]  If 7PM-7AM, please contact night-coverage [email or web address] Password [email or web address] 05/11/2019, 7:50 AM

## 2019-05-11 NOTE — Progress Notes (Addendum)
Inpatient Diabetes Program Recommendations  AACE/ADA: New Consensus Statement on Inpatient Glycemic Control (2015)  Target Ranges:  Prepandial:   less than 140 mg/dL      Peak postprandial:   less than 180 mg/dL (1-2 hours)      Critically ill patients:  140 - 180 mg/dL   Lab Results  Component Value Date   GLUCAP 87 05/11/2019   HGBA1C 8.8 (H) 05/09/2019    Review of Glycemic Control Results for [name], [name] (MRN [id number]) as of 05/11/2019 13:17  Ref. Range 05/10/2019 23:21 05/11/2019 03:42 05/11/2019 07:47 05/11/2019 11:48  Glucose-Capillary Latest Ref Range: 70 - 99 mg/dL 153 (H) 217 (H) 312 (H) 87   Diabetes history: Type 2 DM Outpatient Diabetes medications: Levemir 10 units BID, Prednisone 60 mg QD Current orders for Inpatient glycemic control: Levemir 5 units BID, Novolog 0-9 units Q4H  Prednisone taper  Inpatient Diabetes Program Recommendations:    Noted hyperglycemia of 312 mg/dL this AM. Of note, patient did not receive Levemir yesterday due to recurring hypoglycemia. First dose this AM following CBg of 87 mg/dL.   Reached out to RN to ensure patient is consuming >50% of meals. Patient has not been eating well and asked for multiple snacks/beverages throughout the night, further explaining hyperglycemia.    At this time, recommend decreasing correction to Novolog 0-9 units Q4H to start at 150 mg/dL.   Thanks, Bronson Curb, MSN, RNC-OB Diabetes Coordinator [phone] (8a-5p)

## 2019-05-12 ENCOUNTER — Ambulatory Visit: Payer: BC Managed Care – PPO | Admitting: General Surgery

## 2019-05-12 LAB — BASIC METABOLIC PANEL
Anion gap: 8 (ref 5–15)
BUN: 14 mg/dL (ref 8–23)
CO2: 28 mmol/L (ref 22–32)
Calcium: 7.8 mg/dL — ABNORMAL LOW (ref 8.9–10.3)
Chloride: 104 mmol/L (ref 98–111)
Creatinine, Ser: 0.81 mg/dL (ref 0.44–1.00)
GFR calc Af Amer: >60 mL/min (ref >60)
GFR calc non Af Amer: >60 mL/min (ref >60)
Glucose, Bld: 80 mg/dL (ref 70–99)
Potassium: 3.3 mmol/L — ABNORMAL LOW (ref 3.5–5.1)
Sodium: 140 mmol/L (ref 135–145)

## 2019-05-12 LAB — MAGNESIUM: Magnesium: 1.5 mg/dL — ABNORMAL LOW (ref 1.7–2.4)

## 2019-05-12 LAB — GLUCOSE, CAPILLARY
Glucose-Capillary: >600 mg/dL (ref 70–99)
Glucose-Capillary: 28 mg/dL — CL (ref 70–99)
Glucose-Capillary: 37 mg/dL — CL (ref 70–99)
Glucose-Capillary: 82 mg/dL (ref 70–99)
Glucose-Capillary: 82 mg/dL (ref 70–99)
Glucose-Capillary: 98 mg/dL (ref 70–99)
Glucose-Capillary: 99 mg/dL (ref 70–99)
Glucose-Capillary: 91 mg/dL (ref 70–99)
Glucose-Capillary: 254 mg/dL — ABNORMAL HIGH (ref 70–99)
Glucose-Capillary: 173 mg/dL — ABNORMAL HIGH (ref 70–99)

## 2019-05-12 MED ORDER — INSULIN ASPART 100 UNIT/ML ~~LOC~~ SOLN
0.0000 [IU] | Freq: Four times a day (QID) | SUBCUTANEOUS | Status: DC
  Administered 2019-05-12: 4 [IU] via SUBCUTANEOUS
  Administered 2019-05-13 – 2019-05-21 (×5): 1 [IU] via SUBCUTANEOUS
  Administered 2019-05-21 – 2019-05-22 (×2): 3 [IU] via SUBCUTANEOUS
  Administered 2019-05-22 (×2): 1 [IU] via SUBCUTANEOUS
  Administered 2019-05-23: 13:00:00 3 [IU] via SUBCUTANEOUS
  Administered 2019-05-23: 1 [IU] via SUBCUTANEOUS
  Administered 2019-05-24: 3 [IU] via SUBCUTANEOUS
  Administered 2019-05-24: 06:00:00 1 [IU] via SUBCUTANEOUS
  Administered 2019-05-24: 4 [IU] via SUBCUTANEOUS
  Administered 2019-05-25: 07:00:00 1 [IU] via SUBCUTANEOUS
  Administered 2019-05-25: 01:00:00 4 [IU] via SUBCUTANEOUS
  Administered 2019-05-25: 12:00:00 1 [IU] via SUBCUTANEOUS

## 2019-05-12 MED ORDER — DEXTROSE 10 % IV SOLN
INTRAVENOUS | Status: AC
  Administered 2019-05-12: 05:00:00 via INTRAVENOUS

## 2019-05-12 MED ORDER — POTASSIUM CHLORIDE CRYS ER 20 MEQ PO TBCR
40.0000 meq | EXTENDED_RELEASE_TABLET | Freq: Two times a day (BID) | ORAL | Status: DC
  Administered 2019-05-12 (×2): 40 meq via ORAL
  Filled 2019-05-12 (×2): qty 2

## 2019-05-12 MED ORDER — DEXTROSE 50 % IV SOLN: 25.0000 g | INTRAVENOUS | Status: AC

## 2019-05-12 MED ORDER — MUPIROCIN 2 % EX OINT
TOPICAL_OINTMENT | Freq: Two times a day (BID) | CUTANEOUS | Status: DC
  Administered 2019-05-12 – 2019-05-19 (×16): via NASAL
  Administered 2019-05-20: 1 via NASAL
  Administered 2019-05-20 – 2019-05-25 (×10): via NASAL
  Filled 2019-05-12 (×4): qty 22

## 2019-05-12 MED ORDER — DEXTROSE 50 % IV SOLN
INTRAVENOUS | Status: AC
  Administered 2019-05-12: 04:00:00 50 mL
  Filled 2019-05-12: qty 50

## 2019-05-12 MED ORDER — DEXTROSE 50 % IV SOLN
12.5000 g | INTRAVENOUS | Status: AC
  Administered 2019-05-12: 12.5 g via INTRAVENOUS

## 2019-05-12 MED ORDER — MAGNESIUM SULFATE 2 GM/50ML IV SOLN
2.0000 g | Freq: Once | INTRAVENOUS | Status: AC
  Administered 2019-05-12: 11:00:00 2 g via INTRAVENOUS
  Filled 2019-05-12: qty 50

## 2019-05-12 MED ORDER — ALTEPLASE 2 MG IJ SOLR
2.0000 mg | Freq: Once | INTRAMUSCULAR | Status: AC | PRN
  Administered 2019-05-13: 20:00:00 2 mg
  Filled 2019-05-12 (×3): qty 2

## 2019-05-12 NOTE — Progress Notes (Signed)
Hypoglycemic Event  CBG: 37  Treatment: D50 50 mL (25 gm)  Symptoms: Pale  Follow-up CBG: Time:0429 CBG Result: 82  Possible Reasons for Event: Inadequate meal intake  Comments/MD notified:Dr. Clotilde Dieter O Aristotle Lieb

## 2019-05-12 NOTE — Progress Notes (Signed)
Inpatient Diabetes Program Recommendations  AACE/ADA: New Consensus Statement on Inpatient Glycemic Control (2015)  Target Ranges:  Prepandial:   less than 140 mg/dL      Peak postprandial:   less than 180 mg/dL (1-2 hours)      Critically ill patients:  140 - 180 mg/dL   Lab Results  Component Value Date   GLUCAP 99 05/12/2019   HGBA1C 8.8 (H) 05/09/2019    Review of Glycemic Control Results for RAEGAN, WINDERS (MRN [id number]) as of 05/12/2019 09:04  Ref. Range 05/12/2019 04:28 05/12/2019 04:41 05/12/2019 05:54 05/12/2019 07:39  Glucose-Capillary Latest Ref Range: 70 - 99 mg/dL 82 82 98 99  Results for JAKELYN, SQUYRES (MRN [id number]) as of 05/12/2019 09:04  Ref. Range 05/11/2019 23:44 05/12/2019 03:42 05/12/2019 04:11 05/12/2019 04:28  Glucose-Capillary Latest Ref Range: 70 - 99 mg/dL 76 28 (LL) 37 (LL) 82   Diabetes history:Type 2 DM Outpatient Diabetes medications:Levemir 10 units BID, Prednisone 60 mg QD Current orders for Inpatient glycemic control:Novolog 0-9 units Q4H  Prednisone taper D10@50  ml/hr  Inpatient Diabetes Program Recommendations:   Noted severe hypoglycemia of 28 mg/dL and subsequent insulin changes. Patient was on D10 @50ml /hr and now has been discontinued. Will continue to follow trends.   Thanks, Bronson Curb, MSN, RNC-OB Diabetes Coordinator [phone] (8a-5p)

## 2019-05-12 NOTE — Progress Notes (Signed)
Hypoglycemic Event  CBG: 28  Treatment: D50 50 mL (25 gm)  Symptoms: Pale  Follow-up CBG: Time:0410 CBG Result: 37  Possible Reasons for Event: Inadequate meal intake  Comments/MD notified: Dr. Janan Ridge Pamla Pangle

## 2019-05-12 NOTE — Progress Notes (Signed)
PROGRESS NOTE    [name]  URK:[id number] DOB: [birth year] DOA: 05/09/2019 PCP: [name], MD   Brief Narrative:  Per HPI: [name] a68 y.o.female,with history of atrial fibrillation on anticoagulation therapy with apixaban, diabetes mellitus type 2, LV thrombus, hypertension, depression, abdominal wound infection,? Pyoderma gangrenosum, currently on IV Invanz. Patient was recently discharged from the hospital on 04/30/2019 when she was admitted with abdominal wound infection and was discharged on Invanz for 21 days. As per patient she also has been taking high-dose prednisone which was prescribed by general surgery on 03/09/2019 after she had debridement of the abdominal wound and biopsy came back? Pyoderma gangrenosum.  As per patient, she noticed that her blood sugar has been very high at home she takes 60 mg of prednisone daily. She was not discharged on prednisone on 04/30/2019 as per discharge summary.  She denies chest pain but complains of palpitations. Admits to having shortness of breath. Complains of nausea but denies vomiting and diarrhea. Denies abdominal pain or dysuria.  In the ED patient started on IV Cardizem infusion for atrial fibrillation with RVR.  9/22: Patient noted to have significant hypoglycemia. She is having very poor oral intake overall. Heart rate appears to be better controlled.  9/23: Hypoglycemia has improved as well as heart rate.  She still is on minimal dose of IV Cardizem which will not be discontinued and hopefully, patient can be transferred to telemetry floor today.  She is noted to have some mild delirium as well.  9/24: Patient continued to have episodes of hypoglycemia overnight and required D10 infusion temporarily.  She continues to eat well and has less delirium today.  Potassium and magnesium still a little bit on the low side.   Assessment & Plan:   Active Problems:   Atrial fibrillation with RVR  (HCC)   Atrial fibrillation with RVR-resolved -Monitor on telemetry -Restart home oral Cardizem as well today -Continue Eliquis for anticoagulation -Continue metoprolol p.o. 50 mg twice daily  Mild delirium-improved -Continue to monitor it is likely due to current hospitalization -We will transfer to telemetry today and hopefully this will help  Mild hypokalemia-improved -Supplement potassium and magnesium and hold Lasix today -Recheck electrolytes in a.m.  Chronic abdominal wound with pyoderma gangrenosum -Continue IV Invanz as previously prescribed -PICC line did not need to be exchanged as there was simply a positional problem -Appreciate general surgery recommendations and will continue wound dressings and changes  Type 2 diabetes with labile blood glucose readings- persistent -Continue on SSI as needed with Accu-Cheks every 4 hours -Continue carb modified diet -Appreciate diabetes coordinator recommendations; A1c noted to be 8.8% -We will need endocrinology outpatient follow-up soon for insulin pump likely -Levemir once again discontinued and patient did require D10 infusion briefly overnight.  She does appear to be eating well.  Discussed with diabetes coordinator today who will write additional recommendations so as to try and have a solid discharge plan.  Acute on chronic diastolic heart failure-improved -Mild shortness of breath noted, but fluid balance still negative -Hold oral Lasix for now  DVT prophylaxis:Apixaban Code Status:Full Family Communication:None at bedside Disposition Plan:  Heart rates are currently stable and we will monitor on telemetry.  Delirium seems to be improved, unfortunately she still has significant hypoglycemia overnight.  Discussed with diabetes coordinator who will help with additional recommendations.   Consultants:  General surgery peripherally following  Procedures:  None  Antimicrobials:  Anti-infectives (From  admission, onward)   Start     Dose/Rate  Route Frequency Ordered Stop   05/10/19 1300  ertapenem (INVANZ) 1,000 mg in sodium chloride 0.9 % 100 mL IVPB     1 g 200 mL/hr over 30 Minutes Intravenous Every 24 hours 05/09/19 2201         Subjective: Patient seen and evaluated today with no new acute complaints or concerns. No acute concerns or events noted overnight.  She is more alert and awake today.  She has been eating well.  Objective: Vitals:   05/12/19 0551 05/12/19 0659 05/12/19 1044 05/12/19 1258  BP: (!) 95/58  136/87 113/63  Pulse: 70  78 80  Resp: 20   18  Temp:  98.4 F (36.9 C)  98 F (36.7 C)  TempSrc:  Oral  Oral  SpO2: 100%   94%  Weight:      Height:        Intake/Output Summary (Last 24 hours) at 05/12/2019 1330 Last data filed at 05/12/2019 0854 Gross per 24 hour  Intake 301 ml  Output 1700 ml  Net -1399 ml   Filed Weights   05/09/19 1708 05/11/19 0500  Weight: 89.8 kg 95.1 kg    Examination:  General exam: Appears calm and comfortable  Respiratory system: Clear to auscultation. Respiratory effort normal.  Nasal cannula oxygen present. Cardiovascular system: S1 & S2 heard, RRR. No JVD, murmurs, rubs, gallops or clicks. No pedal edema. Gastrointestinal system: Abdomen is nondistended, soft and nontender. No organomegaly or masses felt. Normal bowel sounds heard.  Abdominal wound clean dry and intact. Central nervous system: Alert and oriented. No focal neurological deficits. Extremities: Symmetric 5 x 5 power. Skin: No rashes, lesions or ulcers Psychiatry: Flat affect    Data Reviewed: I have personally reviewed following labs and imaging studies  CBC: Recent Labs  Lab 05/09/19 1744 05/10/19 0249 05/11/19 0648  WBC 7.7 7.1 5.9  NEUTROABS 6.0  --   --   HGB 8.8* 8.5* 8.2*  HCT 31.4* 30.7* 29.4*  MCV 89.5 91.1 89.1  PLT 343 322 378   Basic Metabolic Panel: Recent Labs  Lab 05/09/19 1744 05/10/19 0249 05/11/19 0648 05/12/19 0504  NA  140 139 139 140  K 3.4* 3.4* 3.9 3.3*  CL 105 105 104 104  CO2 26 24 28 28   GLUCOSE 193* 177* 279* 80  BUN 9 9 12 14   CREATININE 0.63 0.68 0.70 0.81  CALCIUM 7.5* 7.2* 7.3* 7.8*  MG  --   --  1.2* 1.5*   GFR: Estimated Creatinine Clearance: 74.4 mL/min (by C-G formula based on SCr of 0.81 mg/dL). Liver Function Tests: Recent Labs  Lab 05/09/19 1744 05/10/19 0249  AST 13* 14*  ALT 15 13  ALKPHOS 94 88  BILITOT 0.9 0.8  PROT 5.3* 4.9*  ALBUMIN 2.1* 2.0*   No results for input(s): LIPASE, AMYLASE in the last 168 hours. No results for input(s): AMMONIA in the last 168 hours. Coagulation Profile: No results for input(s): INR, PROTIME in the last 168 hours. Cardiac Enzymes: No results for input(s): CKTOTAL, CKMB, CKMBINDEX, TROPONINI in the last 168 hours. BNP (last 3 results) No results for input(s): PROBNP in the last 8760 hours. HbA1C: Recent Labs    05/09/19 2011  HGBA1C 8.8*   CBG: Recent Labs  Lab 05/12/19 0428 05/12/19 0441 05/12/19 0554 05/12/19 0739 05/12/19 1103  GLUCAP 82 82 98 99 91   Lipid Profile: No results for input(s): CHOL, HDL, LDLCALC, TRIG, CHOLHDL, LDLDIRECT in the last 72 hours. Thyroid Function Tests: No results  for input(s): TSH, T4TOTAL, FREET4, T3FREE, THYROIDAB in the last 72 hours. Anemia Panel: No results for input(s): VITAMINB12, FOLATE, FERRITIN, TIBC, IRON, RETICCTPCT in the last 72 hours. Sepsis Labs: No results for input(s): PROCALCITON, LATICACIDVEN in the last 168 hours.  Recent Results (from the past 240 hour(s))  SARS Coronavirus 2 [facility] order, Performed in [facility] hospital lab) Nasopharyngeal Nasopharyngeal Swab     Status: None   Collection Time: 05/09/19  5:37 PM   Specimen: Nasopharyngeal Swab  Result Value Ref Range Status   SARS Coronavirus 2 NEGATIVE NEGATIVE Final    Comment: (NOTE) If result is NEGATIVE SARS-CoV-2 target nucleic acids are NOT DETECTED. The SARS-CoV-2 RNA is generally detectable in  upper and lower  respiratory specimens during the acute phase of infection. The lowest  concentration of SARS-CoV-2 viral copies this assay can detect is 250  copies / mL. A negative result does not preclude SARS-CoV-2 infection  and should not be used as the sole basis for treatment or other  patient management decisions.  A negative result may occur with  improper specimen collection / handling, submission of specimen other  than nasopharyngeal swab, presence of viral mutation(s) within the  areas targeted by this assay, and inadequate number of viral copies  (<250 copies / mL). A negative result must be combined with clinical  observations, patient history, and epidemiological information. If result is POSITIVE SARS-CoV-2 target nucleic acids are DETECTED. The SARS-CoV-2 RNA is generally detectable in upper and lower  respiratory specimens dur ing the acute phase of infection.  Positive  results are indicative of active infection with SARS-CoV-2.  Clinical  correlation with patient history and other diagnostic information is  necessary to determine patient infection status.  Positive results do  not rule out bacterial infection or co-infection with other viruses. If result is PRESUMPTIVE POSTIVE SARS-CoV-2 nucleic acids MAY BE PRESENT.   A presumptive positive result was obtained on the submitted specimen  and confirmed on repeat testing.  While 2019 novel coronavirus  (SARS-CoV-2) nucleic acids may be present in the submitted sample  additional confirmatory testing may be necessary for epidemiological  and / or clinical management purposes  to differentiate between  SARS-CoV-2 and other Sarbecovirus currently known to infect humans.  If clinically indicated additional testing with an alternate test  methodology [phone]) is advised. The SARS-CoV-2 RNA is generally  detectable in upper and lower respiratory sp ecimens during the acute  phase of infection. The expected result is  Negative. Fact Sheet for Patients:  StrictlyIdeas.no Fact Sheet for Healthcare Providers: BankingDealers.co.za This test is not yet approved or cleared by the [place] FDA and has been authorized for detection and/or diagnosis of SARS-CoV-2 by FDA under an Emergency Use Authorization (EUA).  This EUA will remain in effect (meaning this test can be used) for the duration of the COVID-19 declaration under Section 564(b)(1) of the Act, 21 U.S.C. section 360bbb-3(b)(1), unless the authorization is terminated or revoked sooner. Performed at [facility], [address] [place], [place] [address]   MRSA PCR Screening     Status: Abnormal   Collection Time: 05/09/19 10:04 PM   Specimen: Nasal Mucosa; Nasopharyngeal  Result Value Ref Range Status   MRSA by PCR POSITIVE (A) NEGATIVE Final    Comment:        The GeneXpert MRSA Assay (FDA approved for NASAL specimens only), is one component of a comprehensive MRSA colonization surveillance program. It is not intended to diagnose MRSA infection nor  to guide or monitor treatment for MRSA infections. RESULT CALLED TO, READ BACK BY AND VERIFIED WITH: Nicholes Rough @0529  05/10/19 [facility] Performed at [facility], [address], [place], [place] [address]          Radiology Studies: No results found.      Scheduled Meds: . apixaban  5 mg Oral BID  . buPROPion  300 mg Oral Daily  . Chlorhexidine Gluconate Cloth  6 each Topical Daily  . diltiazem  120 mg Oral Daily  . escitalopram  10 mg Oral Daily  . feeding supplement (ENSURE ENLIVE)  237 mL Oral BID BM  . ferrous sulfate  325 mg Oral BID WC  . insulin aspart  0-9 Units Subcutaneous Q4H  . metoprolol tartrate  50 mg Oral BID  . pantoprazole  40 mg Oral QAC breakfast  . potassium chloride  40 mEq Oral BID  . sodium chloride flush  3 mL Intravenous Q12H   Continuous Infusions: . sodium chloride    . ertapenem 20 mL/hr at  05/11/19 1800     LOS: 3 days    Time spent: 30 minutes    Karena Kinker Darleen Crocker, DO [facility] Pager [phone]  If 7PM-7AM, please contact night-coverage [email or web address] Password [email or web address] 05/12/2019, 1:30 PM

## 2019-05-12 NOTE — Progress Notes (Signed)
Xcover Hypoglycemia, per RN, stopped levemir, d50 iv x2, and bs 82, started D10 at 50m per hour x3 hours.

## 2019-05-13 LAB — GLUCOSE, CAPILLARY
Glucose-Capillary: 130 mg/dL — ABNORMAL HIGH (ref 70–99)
Glucose-Capillary: 88 mg/dL (ref 70–99)
Glucose-Capillary: 102 mg/dL — ABNORMAL HIGH (ref 70–99)
Glucose-Capillary: 123 mg/dL — ABNORMAL HIGH (ref 70–99)
Glucose-Capillary: 156 mg/dL — ABNORMAL HIGH (ref 70–99)
Glucose-Capillary: 92 mg/dL (ref 70–99)

## 2019-05-13 LAB — BASIC METABOLIC PANEL
Anion gap: 7 (ref 5–15)
BUN: 13 mg/dL (ref 8–23)
CO2: 27 mmol/L (ref 22–32)
Calcium: 7.9 mg/dL — ABNORMAL LOW (ref 8.9–10.3)
Chloride: 105 mmol/L (ref 98–111)
Creatinine, Ser: 0.72 mg/dL (ref 0.44–1.00)
GFR calc Af Amer: >60 mL/min (ref >60)
GFR calc non Af Amer: >60 mL/min (ref >60)
Glucose, Bld: 99 mg/dL (ref 70–99)
Potassium: 5 mmol/L (ref 3.5–5.1)
Sodium: 139 mmol/L (ref 135–145)

## 2019-05-13 LAB — MAGNESIUM: Magnesium: 1.8 mg/dL (ref 1.7–2.4)

## 2019-05-13 NOTE — Progress Notes (Addendum)
Inpatient Diabetes Program Recommendations  AACE/ADA: New Consensus Statement on Inpatient Glycemic Control (2015)  Target Ranges:  Prepandial:   less than 140 mg/dL      Peak postprandial:   less than 180 mg/dL (1-2 hours)      Critically ill patients:  140 - 180 mg/dL   Lab Results  Component Value Date   GLUCAP 88 05/13/2019   HGBA1C 8.8 (H) 05/09/2019    Review of Glycemic Control Results for REA, KALAMA (MRN [id number]) as of 05/13/2019 08:58  Ref. Range 05/12/2019 14:02 05/12/2019 18:39 05/12/2019 21:03 05/13/2019 06:29  Glucose-Capillary Latest Ref Range: 70 - 99 mg/dL 254 (H) 130 (H) 173 (H) 88   Diabetes history:Type 2 DM Outpatient Diabetes medications:Levemir 10 units BID, Prednisone 60 mg QD Current orders for Inpatient glycemic control:Novolog 0-7 unitsQ6H   Inpatient Diabetes Program Recommendations:  AM trends look improved with custom scale correction Q6H. Novolog is preferred with patient's insurance. Continue to encouraged endo follow up. Will plan to check back on lunch time CBG.  Addendum@1330 : Spoke with patient again regarding changing home insulin regimen. Patient aware that plan was to discontinue Levemir due to hypoglycemia and is comfortable with dosing insulin pens. Reviewed hypoglycemia and interventions. Also, discussed sliding scale insulin, custom dosages, current inpatient needs, and frequency using insulin pen. Patient able to perform self injection.  During conversation, patient became confused, unable to perform teach back and was unable to formulate clear sentences.  Permission granted to discuss with husband.  Spoke with patient's husband to discuss diabetes management at home. Husband is willing to help with injections and assist in determining dosages for sliding scale around his work schedule and with the help of their granddaughter.  Educated husband on diabetes, patho of diabetes, need for insulin, role of pancreas, vascular changes and  commorbidities. Reviewed Novolog as a short acting insulin, sliding scale, gave examples and reviewed current custom scale. Encouraged husband to make multiple copies of this for home, car and work. Reviewed extensively hyper vs hypo glycemia, interventions and when to call MD. Able to perform teach back and anticipates to have information in DC summary.  9/24-Late entry @1430 - Discussed with Dr Manuella Ghazi recommendations for recurrent hypoglycemia. Reviewed inpatient trends and discussed use of Novolog 0-7 units per custom scale Q6H to start at a minimum of 151 mg/dL and to not exceed 7 units per dose. Assuming d/t Prednisone taper, oral intake and that patient was having lows on home dosages. Plan to watch trends and follow up in AM.   Thanks, Bronson Curb, MSN, RNC-OB Diabetes Coordinator [phone] (8a-5p)

## 2019-05-13 NOTE — Progress Notes (Signed)
At 2045 attempt was made at removing cath-flow solution.  This attempt was unsuccessful.  I received no blood return.  Will let it dwell another hour and attempt again.

## 2019-05-13 NOTE — Progress Notes (Signed)
PROGRESS NOTE    [name]  KDT:[id number] DOB: 09/18/1950 DOA: 05/09/2019 PCP: [name], MD   Brief Narrative:  Per HPI: [name] a68 y.o.female,with history of atrial fibrillation on anticoagulation therapy with apixaban, diabetes mellitus type 2, LV thrombus, hypertension, depression, abdominal wound infection,? Pyoderma gangrenosum, currently on IV Invanz. Patient was recently discharged from the hospital on 04/30/2019 when she was admitted with abdominal wound infection and was discharged on Invanz for 21 days. As per patient she also has been taking high-dose prednisone which was prescribed by general surgery on 03/09/2019 after she had debridement of the abdominal wound and biopsy came back? Pyoderma gangrenosum.  As per patient, she noticed that her blood sugar has been very high at home she takes 60 mg of prednisone daily. She was not discharged on prednisone on 04/30/2019 as per discharge summary.  She denies chest pain but complains of palpitations. Admits to having shortness of breath. Complains of nausea but denies vomiting and diarrhea. Denies abdominal pain or dysuria.  In the ED patient started on IV Cardizem infusion for atrial fibrillation with RVR.  9/22: Patient noted to have significant hypoglycemia. She is having very poor oral intake overall. Heart rate appears to be better controlled.  9/23: Hypoglycemia has improved as well as heart rate. She still is on minimal dose of IV Cardizem which will not be discontinued and hopefully, patient can be transferred to telemetry floor today. She is noted to have some mild delirium as well.  9/24: Patient continued to have episodes of hypoglycemia overnight and required D10 infusion temporarily.  She continues to eat well and has less delirium today.  Potassium and magnesium still a little bit on the low side.  9/25: Patient appears to be stabilizing from a blood glucose standpoint on  sliding scale, but will monitor 24 more hours to ensure stability prior to discharge.  She also appears to have less delirium and potassium is improved.  Assessment & Plan:   Active Problems:   Atrial fibrillation with RVR (HCC)   Atrial fibrillation with RVR-resolved -Monitor on telemetry -Restart home oral Cardizem as well today -Continue Eliquis for anticoagulation -Continue metoprolol p.o. 50 mg twice daily  Mild delirium-improved -Continue to monitor it is likely due to current hospitalization -We will transfer to telemetry today and hopefully this will help  Mild hypokalemia-improved -Supplement potassium and magnesium and hold Lasix today -Recheck electrolytes in a.m.  Chronic abdominal wound with pyoderma gangrenosum -Continue IV Invanz as previously prescribed -PICC linedid not need to be exchanged as there was simply a positional problem -Appreciate general surgery recommendations and will continue wound dressings and changes  Type 2 diabetes with labile blood glucose readings- persistent -Continue on SSI as needed with Accu-Cheks every 6 hours -Continue carb modified diet -Appreciate diabetes coordinator recommendations;A1c noted to be 8.8% -We will need endocrinology outpatient follow-up soon for insulin pump likely -Discussed case with diabetes coordinator and will plan to discharge on sliding scale insulin as prescribed with NovoLog pen in a.m. if she remains stable.  This will be the safest plan for discharge and will hold further Levemir until seen by endocrinology in the near future.  Acute on chronic diastolic heart failure-improved -Mild shortness of breath noted, but fluid balance still negative -Hold oral Lasix for now  DVT prophylaxis:Apixaban Code Status:Full Family Communication:None at bedside Disposition Plan: Heart rates are currently stable and we will monitor on telemetry.  Delirium seems to be improved, her blood glucose levels appear  to  be stabilizing, but will monitor trend for another 24 hours to ensure stability prior to discharge on sliding scale insulin.   Consultants:  General surgery peripherally following  Procedures:  None  Antimicrobials:  Anti-infectives (From admission, onward)   Start     Dose/Rate Route Frequency Ordered Stop   05/10/19 1300  ertapenem (INVANZ) 1,000 mg in sodium chloride 0.9 % 100 mL IVPB     1 g 200 mL/hr over 30 Minutes Intravenous Every 24 hours 05/09/19 2201         Subjective: Patient seen and evaluated today with no new acute complaints or concerns. No acute concerns or events noted overnight.  Blood glucose appears to be stabilizing.  She has had delirium that appears to be improving.  Still noted to have dietary inconsistencies.  Objective: Vitals:   05/12/19 1258 05/12/19 2101 05/12/19 2330 05/13/19 0600  BP: 113/63 (!) 116/48  (!) 116/94  Pulse: 80 (!) 53 72 98  Resp: 18 18  15   Temp: 98 F (36.7 C) 97.9 F (36.6 C)  98 F (36.7 C)  TempSrc: Oral Oral  Oral  SpO2: 94% 97%  94%  Weight:      Height:        Intake/Output Summary (Last 24 hours) at 05/13/2019 1259 Last data filed at 05/13/2019 0800 Gross per 24 hour  Intake 365.32 ml  Output 850 ml  Net -484.68 ml   Filed Weights   05/09/19 1708 05/11/19 0500  Weight: 89.8 kg 95.1 kg    Examination:  General exam: Appears calm and comfortable  Respiratory system: Clear to auscultation. Respiratory effort normal. Cardiovascular system: S1 & S2 heard, RRR. No JVD, murmurs, rubs, gallops or clicks. No pedal edema. Gastrointestinal system: Abdomen is nondistended, soft and nontender. No organomegaly or masses felt. Normal bowel sounds heard.  Abdominal wound stable. Central nervous system: Alert and oriented. No focal neurological deficits. Extremities: Symmetric 5 x 5 power. Skin: No rashes, lesions or ulcers Psychiatry: Judgement and insight appear normal. Mood & affect appropriate.     Data  Reviewed: I have personally reviewed following labs and imaging studies  CBC: Recent Labs  Lab 05/09/19 1744 05/10/19 0249 05/11/19 0648  WBC 7.7 7.1 5.9  NEUTROABS 6.0  --   --   HGB 8.8* 8.5* 8.2*  HCT 31.4* 30.7* 29.4*  MCV 89.5 91.1 89.1  PLT 343 322 952   Basic Metabolic Panel: Recent Labs  Lab 05/09/19 1744 05/10/19 0249 05/11/19 0648 05/12/19 0504 05/13/19 0549  NA 140 139 139 140 139  K 3.4* 3.4* 3.9 3.3* 5.0  CL 105 105 104 104 105  CO2 26 24 28 28 27   GLUCOSE 193* 177* 279* 80 99  BUN 9 9 12 14 13   CREATININE 0.63 0.68 0.70 0.81 0.72  CALCIUM 7.5* 7.2* 7.3* 7.8* 7.9*  MG  --   --  1.2* 1.5* 1.8   GFR: Estimated Creatinine Clearance: 75.3 mL/min (by C-G formula based on SCr of 0.72 mg/dL). Liver Function Tests: Recent Labs  Lab 05/09/19 1744 05/10/19 0249  AST 13* 14*  ALT 15 13  ALKPHOS 94 88  BILITOT 0.9 0.8  PROT 5.3* 4.9*  ALBUMIN 2.1* 2.0*   No results for input(s): LIPASE, AMYLASE in the last 168 hours. No results for input(s): AMMONIA in the last 168 hours. Coagulation Profile: No results for input(s): INR, PROTIME in the last 168 hours. Cardiac Enzymes: No results for input(s): CKTOTAL, CKMB, CKMBINDEX, TROPONINI in the last 168 hours.  BNP (last 3 results) No results for input(s): PROBNP in the last 8760 hours. HbA1C: No results for input(s): HGBA1C in the last 72 hours. CBG: Recent Labs  Lab 05/12/19 2103 05/13/19 0629 05/13/19 0923 05/13/19 1110 05/13/19 1232  GLUCAP 173* 88 102* 123* 156*   Lipid Profile: No results for input(s): CHOL, HDL, LDLCALC, TRIG, CHOLHDL, LDLDIRECT in the last 72 hours. Thyroid Function Tests: No results for input(s): TSH, T4TOTAL, FREET4, T3FREE, THYROIDAB in the last 72 hours. Anemia Panel: No results for input(s): VITAMINB12, FOLATE, FERRITIN, TIBC, IRON, RETICCTPCT in the last 72 hours. Sepsis Labs: No results for input(s): PROCALCITON, LATICACIDVEN in the last 168 hours.  Recent Results  (from the past 240 hour(s))  SARS Coronavirus 2 [facility] order, Performed in [facility] hospital lab) Nasopharyngeal Nasopharyngeal Swab     Status: None   Collection Time: 05/09/19  5:37 PM   Specimen: Nasopharyngeal Swab  Result Value Ref Range Status   SARS Coronavirus 2 NEGATIVE NEGATIVE Final    Comment: (NOTE) If result is NEGATIVE SARS-CoV-2 target nucleic acids are NOT DETECTED. The SARS-CoV-2 RNA is generally detectable in upper and lower  respiratory specimens during the acute phase of infection. The lowest  concentration of SARS-CoV-2 viral copies this assay can detect is 250  copies / mL. A negative result does not preclude SARS-CoV-2 infection  and should not be used as the sole basis for treatment or other  patient management decisions.  A negative result may occur with  improper specimen collection / handling, submission of specimen other  than nasopharyngeal swab, presence of viral mutation(s) within the  areas targeted by this assay, and inadequate number of viral copies  (<250 copies / mL). A negative result must be combined with clinical  observations, patient history, and epidemiological information. If result is POSITIVE SARS-CoV-2 target nucleic acids are DETECTED. The SARS-CoV-2 RNA is generally detectable in upper and lower  respiratory specimens dur ing the acute phase of infection.  Positive  results are indicative of active infection with SARS-CoV-2.  Clinical  correlation with patient history and other diagnostic information is  necessary to determine patient infection status.  Positive results do  not rule out bacterial infection or co-infection with other viruses. If result is PRESUMPTIVE POSTIVE SARS-CoV-2 nucleic acids MAY BE PRESENT.   A presumptive positive result was obtained on the submitted specimen  and confirmed on repeat testing.  While 2019 novel coronavirus  (SARS-CoV-2) nucleic acids may be present in the submitted sample  additional  confirmatory testing may be necessary for epidemiological  and / or clinical management purposes  to differentiate between  SARS-CoV-2 and other Sarbecovirus currently known to infect humans.  If clinically indicated additional testing with an alternate test  methodology [phone]) is advised. The SARS-CoV-2 RNA is generally  detectable in upper and lower respiratory sp ecimens during the acute  phase of infection. The expected result is Negative. Fact Sheet for Patients:  StrictlyIdeas.no Fact Sheet for Healthcare Providers: BankingDealers.co.za This test is not yet approved or cleared by the [place] FDA and has been authorized for detection and/or diagnosis of SARS-CoV-2 by FDA under an Emergency Use Authorization (EUA).  This EUA will remain in effect (meaning this test can be used) for the duration of the COVID-19 declaration under Section 564(b)(1) of the Act, 21 U.S.C. section 360bbb-3(b)(1), unless the authorization is terminated or revoked sooner. Performed at [facility], [address], [place], [place] [address]   MRSA PCR Screening  Status: Abnormal   Collection Time: 05/09/19 10:04 PM   Specimen: Nasal Mucosa; Nasopharyngeal  Result Value Ref Range Status   MRSA by PCR POSITIVE (A) NEGATIVE Final    Comment:        The GeneXpert MRSA Assay (FDA approved for NASAL specimens only), is one component of a comprehensive MRSA colonization surveillance program. It is not intended to diagnose MRSA infection nor to guide or monitor treatment for MRSA infections. RESULT CALLED TO, READ BACK BY AND VERIFIED WITH: Nicholes Rough @0529  05/10/19 [facility] Performed at [facility], [address], [place], [place] [address]          Radiology Studies: No results found.      Scheduled Meds: . apixaban  5 mg Oral BID  . buPROPion  300 mg Oral Daily  . Chlorhexidine Gluconate Cloth  6 each Topical Daily  . diltiazem   120 mg Oral Daily  . escitalopram  10 mg Oral Daily  . feeding supplement (ENSURE ENLIVE)  237 mL Oral BID BM  . ferrous sulfate  325 mg Oral BID WC  . insulin aspart  0-7 Units Subcutaneous Q6H  . metoprolol tartrate  50 mg Oral BID  . mupirocin ointment   Nasal BID  . pantoprazole  40 mg Oral QAC breakfast  . sodium chloride flush  3 mL Intravenous Q12H   Continuous Infusions: . sodium chloride    . ertapenem 1,000 mg (05/12/19 1359)     LOS: 4 days    Time spent: 30 minutes    Brendia Dampier Darleen Crocker, DO [facility] Pager [phone]  If 7PM-7AM, please contact night-coverage [email or web address] Password [facility] 05/13/2019, 12:59 PM

## 2019-05-13 NOTE — TOC Progression Note (Signed)
Transition of Care [facility]) - Progression Note    Patient Details  Name: [name] MRN: [id number] Date of Birth: [birth year]  Transition of Care [facility]) CM/SW Contact  Ihor Gully, LCSW Phone Number: 05/13/2019, 1:54 PM  Clinical Narrative:    [name] with [facility] advised that patient is expected to discharge tomorrow, 05/13/2019. Attending advised to [facility] orders needed for RN.   Expected Discharge Plan: [facility]    Expected Discharge Plan and Services Expected Discharge Plan: [place]                                               Social Determinants of Health (SDOH) Interventions    Readmission Risk Interventions Readmission Risk Prevention Plan 05/10/2019 04/30/2019 04/29/2019  Transportation Screening Complete Complete Complete  Medication Review [employer]) Complete Complete Complete  PCP or Specialist appointment within 3-5 days of discharge Complete Complete Not Complete  PCP/Specialist Appt Not Complete comments Going tomorrow after discharge. - -  HRI or Home Care Consult Complete Complete Complete  SW Recovery Care/Counseling Consult Complete Patient refused Complete  Palliative Care Screening - Not Applicable Not Complete  Skilled Nursing Facility Not Complete Not Applicable Not Complete  Some recent data might be hidden

## 2019-05-14 ENCOUNTER — Inpatient Hospital Stay (HOSPITAL_COMMUNITY): Payer: BC Managed Care – PPO

## 2019-05-14 LAB — URINALYSIS, ROUTINE W REFLEX MICROSCOPIC
Bilirubin Urine: NEGATIVE
Glucose, UA: NEGATIVE mg/dL
Ketones, ur: NEGATIVE mg/dL
Nitrite: NEGATIVE
Protein, ur: NEGATIVE mg/dL
Specific Gravity, Urine: 1.012 (ref 1.005–1.030)
pH: 6 (ref 5.0–8.0)

## 2019-05-14 LAB — GLUCOSE, CAPILLARY
Glucose-Capillary: 127 mg/dL — ABNORMAL HIGH (ref 70–99)
Glucose-Capillary: 133 mg/dL — ABNORMAL HIGH (ref 70–99)
Glucose-Capillary: 155 mg/dL — ABNORMAL HIGH (ref 70–99)
Glucose-Capillary: 80 mg/dL (ref 70–99)
Glucose-Capillary: 81 mg/dL (ref 70–99)

## 2019-05-14 LAB — BASIC METABOLIC PANEL
Anion gap: 7 (ref 5–15)
BUN: 13 mg/dL (ref 8–23)
CO2: 29 mmol/L (ref 22–32)
Calcium: 8.3 mg/dL — ABNORMAL LOW (ref 8.9–10.3)
Chloride: 101 mmol/L (ref 98–111)
Creatinine, Ser: 0.84 mg/dL (ref 0.44–1.00)
GFR calc Af Amer: >60 mL/min (ref >60)
GFR calc non Af Amer: >60 mL/min (ref >60)
Glucose, Bld: 138 mg/dL — ABNORMAL HIGH (ref 70–99)
Potassium: 4.5 mmol/L (ref 3.5–5.1)
Sodium: 137 mmol/L (ref 135–145)

## 2019-05-14 MED ORDER — SODIUM CHLORIDE 0.9 % IV SOLN
1.0000 g | INTRAVENOUS | Status: DC
  Administered 2019-05-15 – 2019-05-21 (×7): 1000 mg via INTRAVENOUS
  Filled 2019-05-14 (×9): qty 1

## 2019-05-14 MED ORDER — QUETIAPINE FUMARATE 25 MG PO TABS
12.5000 mg | ORAL_TABLET | Freq: Every day | ORAL | Status: DC
  Administered 2019-05-14: 21:00:00 12.5 mg via ORAL
  Filled 2019-05-14: qty 1

## 2019-05-14 NOTE — Progress Notes (Signed)
Paged Dr. Maudie Mercury at [phone] and told him about the xray for the PICC line. He said it needed to be pulled out about 2 cm. I called [facility] and they said it was too late for someone to come out tonight but she would send someone tomorrow morning. I gave her [phone] for contact in AM. Held antibiotics.

## 2019-05-14 NOTE — Plan of Care (Signed)
  Problem: Education: Goal: Knowledge of General Education information will improve Description Including pain rating scale, medication(s)/side effects and non-pharmacologic comfort measures Outcome: Progressing   Problem: Health Behavior/Discharge Planning: Goal: Ability to manage health-related needs will improve Outcome: Progressing   

## 2019-05-14 NOTE — Progress Notes (Signed)
Called Dr. Kennon Holter to tell him [facility] IR called to tell me that xray showed the new PICC line in the atrium.

## 2019-05-14 NOTE — Progress Notes (Signed)
PROGRESS NOTE    [name]  TSV:[id number] DOB: 15-Sep-1950 DOA: 05/09/2019 PCP: Katrina Cha, MD   Brief Narrative:  Per HPI: [name] a68 y.o.female,with history of atrial fibrillation on anticoagulation therapy with apixaban, diabetes mellitus type 2, LV thrombus, hypertension, depression, abdominal wound infection,? Pyoderma gangrenosum, currently on IV Invanz. Patient was recently discharged from the hospital on 04/30/2019 when she was admitted with abdominal wound infection and was discharged on Invanz for 21 days. As per patient she also has been taking high-dose prednisone which was prescribed by general surgery on 03/09/2019 after she had debridement of the abdominal wound and biopsy came back? Pyoderma gangrenosum.  As per patient, she noticed that her blood sugar has been very high at home she takes 60 mg of prednisone daily. She was not discharged on prednisone on 04/30/2019 as per discharge summary.  She denies chest pain but complains of palpitations. Admits to having shortness of breath. Complains of nausea but denies vomiting and diarrhea. Denies abdominal pain or dysuria.  In the ED patient started on IV Cardizem infusion for atrial fibrillation with RVR.  9/22: Patient noted to have significant hypoglycemia. She is having very poor oral intake overall. Heart rate appears to be better controlled.  9/23: Hypoglycemia has improved as well as heart rate. She still is on minimal dose of IV Cardizem which will not be discontinued and hopefully, patient can be transferred to telemetry floor today. She is noted to have some mild delirium as well.  9/24: Patient continued to have episodes of hypoglycemia overnight and required D10 infusion temporarily. She continues to eat well and has less delirium today. Potassium and magnesium still a little bit on the low side.  9/25: Patient appears to be stabilizing from a blood glucose standpoint on  sliding scale, but will monitor 24 more hours to ensure stability prior to discharge.  She also appears to have less delirium and potassium is improved.  9/26: Patient appears to be stable from a blood glucose standpoint and her heart rate is controlled.  Unfortunately, she is having some persistent confusion and delirium.  Her PICC line is also nonfunctioning at this point and will require an exchange prior to going back home.   Assessment & Plan:   Active Problems:   Atrial fibrillation with RVR (HCC)   Atrial fibrillation with RVR-resolved -Monitor on telemetry -Restart home oral Cardizem as well today -Continue Eliquis for anticoagulation -Continue metoprolol p.o. 50 mg twice daily  Mild delirium- persistent -Continue to monitor it is likely due to current hospitalization -We will transfer to telemetry today and hopefully this will help -Check UA today and start Seroquel 12.5 mg at bedtime  Mild hypokalemia-improved -Potassium borderline elevated and will hold further potassium supplementation -Recheck a.m. labs  Chronic abdominal wound with pyoderma gangrenosum -Continue IV Invanz as previously prescribed -PICC linescheduled for exchange today as this is not flushing -Appreciate general surgery recommendations and will continue wound dressings and changes  Type 2 diabetes with labile blood glucose readings-persistent -Continue on SSI as needed with Accu-Cheks every 6 hours -Continue carb modified diet -Appreciate diabetes coordinator recommendations;A1c noted to be 8.8% -We will need endocrinology outpatient follow-up soon for insulin pump likely -Discussed case with diabetes coordinator and will plan to discharge on sliding scale insulin as prescribed with NovoLog pen in a.m. if she remains stable.  This will be the safest plan for discharge and will hold further Levemir until seen by endocrinology in the near future.  Acute on chronic  diastolic heart  failure-improved -Mild shortness of breath noted, but fluid balance still negative -Hold oral Lasix for now  DVT prophylaxis:Apixaban Code Status:Full Family Communication:None at bedside Disposition Plan:Heart rates are currently stable andwe will monitor on telemetry. Delirium seems to be improved, her blood glucose levels appear to be stabilizing, but will monitor trend for another 24 hours to ensure stability prior to discharge on sliding scale insulin.  Plan to exchange PICC line today and start Seroquel tonight.   Consultants:  General surgery peripherally following  Procedures:  None  Antimicrobials:  Anti-infectives (From admission, onward)   Start     Dose/Rate Route Frequency Ordered Stop   05/10/19 1300  ertapenem (INVANZ) 1,000 mg in sodium chloride 0.9 % 100 mL IVPB     1 g 200 mL/hr over 30 Minutes Intravenous Every 24 hours 05/09/19 2201         Subjective: Patient seen and evaluated today with no new acute complaints or concerns. No acute concerns or events noted overnight.  She is somewhat confused this morning.  Blood glucose has remained stable as well as her heart rate.  Objective: Vitals:   05/13/19 0600 05/13/19 1407 05/13/19 2020 05/14/19 0528  BP: (!) 116/94 121/81 (!) 114/53 105/81  Pulse: 98 83 68 (!) 125  Resp: 15 20 20 18   Temp: 98 F (36.7 C) 98.7 F (37.1 C) 97.9 F (36.6 C) (!) 97.4 F (36.3 C)  TempSrc: Oral Oral Oral Oral  SpO2: 94% 96% 96% 95%  Weight:      Height:        Intake/Output Summary (Last 24 hours) at 05/14/2019 1054 Last data filed at 05/13/2019 1700 Gross per 24 hour  Intake 0 ml  Output 500 ml  Net -500 ml   Filed Weights   05/09/19 1708 05/11/19 0500  Weight: 89.8 kg 95.1 kg    Examination:  General exam: Appears calm and comfortable  Respiratory system: Clear to auscultation. Respiratory effort normal. Cardiovascular system: S1 & S2 heard, RRR. No JVD, murmurs, rubs, gallops or clicks. No  pedal edema. Gastrointestinal system: Abdomen is nondistended, soft and nontender. No organomegaly or masses felt. Normal bowel sounds heard.  Abdominal wound clean dry and intact. Central nervous system: Alert and awake, appears minimally confused.  Hallucinating. Extremities: Symmetric 5 x 5 power. Skin: No rashes, lesions or ulcers Psychiatry: Difficult to assess    Data Reviewed: I have personally reviewed following labs and imaging studies  CBC: Recent Labs  Lab 05/09/19 1744 05/10/19 0249 05/11/19 0648  WBC 7.7 7.1 5.9  NEUTROABS 6.0  --   --   HGB 8.8* 8.5* 8.2*  HCT 31.4* 30.7* 29.4*  MCV 89.5 91.1 89.1  PLT 343 322 494   Basic Metabolic Panel: Recent Labs  Lab 05/10/19 0249 05/11/19 0648 05/12/19 0504 05/13/19 0549 05/14/19 0810  NA 139 139 140 139 137  K 3.4* 3.9 3.3* 5.0 4.5  CL 105 104 104 105 101  CO2 24 28 28 27 29   GLUCOSE 177* 279* 80 99 138*  BUN 9 12 14 13 13   CREATININE 0.68 0.70 0.81 0.72 0.84  CALCIUM 7.2* 7.3* 7.8* 7.9* 8.3*  MG  --  1.2* 1.5* 1.8  --    GFR: Estimated Creatinine Clearance: 71.7 mL/min (by C-G formula based on SCr of 0.84 mg/dL). Liver Function Tests: Recent Labs  Lab 05/09/19 1744 05/10/19 0249  AST 13* 14*  ALT 15 13  ALKPHOS 94 88  BILITOT 0.9 0.8  PROT  5.3* 4.9*  ALBUMIN 2.1* 2.0*   No results for input(s): LIPASE, AMYLASE in the last 168 hours. No results for input(s): AMMONIA in the last 168 hours. Coagulation Profile: No results for input(s): INR, PROTIME in the last 168 hours. Cardiac Enzymes: No results for input(s): CKTOTAL, CKMB, CKMBINDEX, TROPONINI in the last 168 hours. BNP (last 3 results) No results for input(s): PROBNP in the last 8760 hours. HbA1C: No results for input(s): HGBA1C in the last 72 hours. CBG: Recent Labs  Lab 05/13/19 1110 05/13/19 1232 05/13/19 1759 05/14/19 0003 05/14/19 0600  GLUCAP 123* 156* 92 133* 127*   Lipid Profile: No results for input(s): CHOL, HDL, LDLCALC,  TRIG, CHOLHDL, LDLDIRECT in the last 72 hours. Thyroid Function Tests: No results for input(s): TSH, T4TOTAL, FREET4, T3FREE, THYROIDAB in the last 72 hours. Anemia Panel: No results for input(s): VITAMINB12, FOLATE, FERRITIN, TIBC, IRON, RETICCTPCT in the last 72 hours. Sepsis Labs: No results for input(s): PROCALCITON, LATICACIDVEN in the last 168 hours.  Recent Results (from the past 240 hour(s))  SARS Coronavirus 2 [facility] order, Performed in [facility] hospital lab) Nasopharyngeal Nasopharyngeal Swab     Status: None   Collection Time: 05/09/19  5:37 PM   Specimen: Nasopharyngeal Swab  Result Value Ref Range Status   SARS Coronavirus 2 NEGATIVE NEGATIVE Final    Comment: (NOTE) If result is NEGATIVE SARS-CoV-2 target nucleic acids are NOT DETECTED. The SARS-CoV-2 RNA is generally detectable in upper and lower  respiratory specimens during the acute phase of infection. The lowest  concentration of SARS-CoV-2 viral copies this assay can detect is 250  copies / mL. A negative result does not preclude SARS-CoV-2 infection  and should not be used as the sole basis for treatment or other  patient management decisions.  A negative result may occur with  improper specimen collection / handling, submission of specimen other  than nasopharyngeal swab, presence of viral mutation(s) within the  areas targeted by this assay, and inadequate number of viral copies  (<250 copies / mL). A negative result must be combined with clinical  observations, patient history, and epidemiological information. If result is POSITIVE SARS-CoV-2 target nucleic acids are DETECTED. The SARS-CoV-2 RNA is generally detectable in upper and lower  respiratory specimens dur ing the acute phase of infection.  Positive  results are indicative of active infection with SARS-CoV-2.  Clinical  correlation with patient history and other diagnostic information is  necessary to determine patient infection status.   Positive results do  not rule out bacterial infection or co-infection with other viruses. If result is PRESUMPTIVE POSTIVE SARS-CoV-2 nucleic acids MAY BE PRESENT.   A presumptive positive result was obtained on the submitted specimen  and confirmed on repeat testing.  While 2019 novel coronavirus  (SARS-CoV-2) nucleic acids may be present in the submitted sample  additional confirmatory testing may be necessary for epidemiological  and / or clinical management purposes  to differentiate between  SARS-CoV-2 and other Sarbecovirus currently known to infect humans.  If clinically indicated additional testing with an alternate test  methodology [id number]) is advised. The SARS-CoV-2 RNA is generally  detectable in upper and lower respiratory sp ecimens during the acute  phase of infection. The expected result is Negative. Fact Sheet for Patients:  StrictlyIdeas.no Fact Sheet for Healthcare Providers: BankingDealers.co.za This test is not yet approved or cleared by the [place] FDA and has been authorized for detection and/or diagnosis of SARS-CoV-2 by FDA under an Emergency Use Authorization (EUA).  This EUA will remain in effect (meaning this test can be used) for the duration of the COVID-19 declaration under Section 564(b)(1) of the Act, 21 U.S.C. section 360bbb-3(b)(1), unless the authorization is terminated or revoked sooner. Performed at [facility], [address] [place], [place] [address]   MRSA PCR Screening     Status: Abnormal   Collection Time: 05/09/19 10:04 PM   Specimen: Nasal Mucosa; Nasopharyngeal  Result Value Ref Range Status   MRSA by PCR POSITIVE (A) NEGATIVE Final    Comment:        The GeneXpert MRSA Assay (FDA approved for NASAL specimens only), is one component of a comprehensive MRSA colonization surveillance program. It is not intended to diagnose MRSA infection nor to guide or monitor treatment for  MRSA infections. RESULT CALLED TO, READ BACK BY AND VERIFIED WITH: Nicholes Rough @0529  05/10/19 [facility] Performed at [facility], [address], [place], [place] [address]          Radiology Studies: No results found.      Scheduled Meds: . apixaban  5 mg Oral BID  . buPROPion  300 mg Oral Daily  . Chlorhexidine Gluconate Cloth  6 each Topical Daily  . diltiazem  120 mg Oral Daily  . escitalopram  10 mg Oral Daily  . feeding supplement (ENSURE ENLIVE)  237 mL Oral BID BM  . ferrous sulfate  325 mg Oral BID WC  . insulin aspart  0-7 Units Subcutaneous Q6H  . metoprolol tartrate  50 mg Oral BID  . mupirocin ointment   Nasal BID  . pantoprazole  40 mg Oral QAC breakfast  . QUEtiapine  12.5 mg Oral QHS  . sodium chloride flush  3 mL Intravenous Q12H   Continuous Infusions: . sodium chloride    . ertapenem 1,000 mg (05/13/19 1428)     LOS: 5 days    Time spent: 30 minutes    Sherisse Fullilove Darleen Crocker, DO [facility] Pager [phone]  If 7PM-7AM, please contact night-coverage [email or web address] Password [facility] 05/14/2019, 10:54 AM

## 2019-05-15 ENCOUNTER — Inpatient Hospital Stay (HOSPITAL_COMMUNITY): Payer: BC Managed Care – PPO

## 2019-05-15 LAB — GLUCOSE, CAPILLARY
Glucose-Capillary: 70 mg/dL (ref 70–99)
Glucose-Capillary: 73 mg/dL (ref 70–99)
Glucose-Capillary: 74 mg/dL (ref 70–99)
Glucose-Capillary: 93 mg/dL (ref 70–99)
Glucose-Capillary: 95 mg/dL (ref 70–99)

## 2019-05-15 LAB — BASIC METABOLIC PANEL
Anion gap: 8 (ref 5–15)
BUN: 11 mg/dL (ref 8–23)
CO2: 30 mmol/L (ref 22–32)
Calcium: 8.5 mg/dL — ABNORMAL LOW (ref 8.9–10.3)
Chloride: 100 mmol/L (ref 98–111)
Creatinine, Ser: 0.78 mg/dL (ref 0.44–1.00)
GFR calc Af Amer: >60 mL/min (ref >60)
GFR calc non Af Amer: >60 mL/min (ref >60)
Glucose, Bld: 70 mg/dL (ref 70–99)
Potassium: 4.2 mmol/L (ref 3.5–5.1)
Sodium: 138 mmol/L (ref 135–145)

## 2019-05-15 MED ORDER — FUROSEMIDE 10 MG/ML IJ SOLN
40.0000 mg | Freq: Once | INTRAMUSCULAR | Status: AC
  Administered 2019-05-15: 40 mg via INTRAVENOUS
  Filled 2019-05-15: qty 4

## 2019-05-15 MED ORDER — QUETIAPINE FUMARATE 25 MG PO TABS: 12.5000 mg | ORAL_TABLET | Freq: Two times a day (BID) | ORAL | Status: DC

## 2019-05-15 MED ORDER — QUETIAPINE FUMARATE 25 MG PO TABS
25.0000 mg | ORAL_TABLET | Freq: Every day | ORAL | Status: DC
  Administered 2019-05-15: 22:00:00 25 mg via ORAL
  Filled 2019-05-15: qty 1

## 2019-05-15 MED ORDER — FUROSEMIDE 40 MG PO TABS
40.0000 mg | ORAL_TABLET | Freq: Two times a day (BID) | ORAL | Status: DC
  Administered 2019-05-15 – 2019-05-17 (×5): 40 mg via ORAL
  Filled 2019-05-15 (×4): qty 1

## 2019-05-15 NOTE — Consult Note (Signed)
Initial rad report states line should be retracted 4.5 cm. This clinician requests new cxr. Tip found to be located at [place] (the same as my initial assessment of tip location). Line retracted 2cm to place tip at distal SVC. CAJ or distal SVC are both appropriate tip locations.

## 2019-05-15 NOTE — Progress Notes (Signed)
PROGRESS NOTE    [name]  JTT:[id number] DOB: [birth year] DOA: 05/09/2019 PCP: [name], MD   Brief Narrative:  Per HPI: [name] a68 y.o.female,with history of atrial fibrillation on anticoagulation therapy with apixaban, diabetes mellitus type 2, LV thrombus, hypertension, depression, abdominal wound infection,? Pyoderma gangrenosum, currently on IV Invanz. Patient was recently discharged from the hospital on 04/30/2019 when she was admitted with abdominal wound infection and was discharged on Invanz for 21 days. As per patient she also has been taking high-dose prednisone which was prescribed by general surgery on 03/09/2019 after she had debridement of the abdominal wound and biopsy came back? Pyoderma gangrenosum.  As per patient, she noticed that her blood sugar has been very high at home she takes 60 mg of prednisone daily. She was not discharged on prednisone on 04/30/2019 as per discharge summary.  She denies chest pain but complains of palpitations. Admits to having shortness of breath. Complains of nausea but denies vomiting and diarrhea. Denies abdominal pain or dysuria.  In the ED patient started on IV Cardizem infusion for atrial fibrillation with RVR.  9/22: Patient noted to have significant hypoglycemia. She is having very poor oral intake overall. Heart rate appears to be better controlled.  9/23: Hypoglycemia has improved as well as heart rate. She still is on minimal dose of IV Cardizem which will not be discontinued and hopefully, patient can be transferred to telemetry floor today. She is noted to have some mild delirium as well.  9/24: Patient continued to have episodes of hypoglycemia overnight and required D10 infusion temporarily. She continues to eat well and has less delirium today. Potassium and magnesium still a little bit on the low side.  9/25:Patient appears to be stabilizing from a blood glucose standpoint on  sliding scale, but will monitor 24 more hours to ensure stability prior to discharge. She also appears to have less delirium and potassium is improved.  9/26: Patient appears to be stable from a blood glucose standpoint and her heart rate is controlled.  Unfortunately, she is having some persistent confusion and delirium.  Her PICC line is also nonfunctioning at this point and will require an exchange prior to going back home.  9/27: Patient was given Seroquel overnight, but is still confused and disoriented.  Heart rate and blood glucose is well controlled at this point.  PICC line will require repositioning by PICC team today.  We will plan to diurese further and increased dose of Seroquel tonight.   Assessment & Plan:   Active Problems:   Atrial fibrillation with RVR (HCC)   Atrial fibrillation with RVR-resolved -Monitor on telemetry -Restart home oral Cardizem as well today -Continue Eliquis for anticoagulation -Continue metoprolol p.o. 50 mg twice daily  Mild delirium- persistent -Continue to monitor it is likely due to current hospitalization -We will transfer to telemetry today and hopefully this will help -Urine without signs of UTI and will increase Seroquel to 25 mg tonight  Mild hypokalemia-improved -Potassium borderline elevated and will hold further potassium supplementation -Recheck a.m. labs  Chronic abdominal wound with pyoderma gangrenosum -Continue IV Invanz as previously prescribed once PICC team readjusts access -PICC linescheduled for exchange today as this is not flushing -Appreciate general surgery recommendations and will continue wound dressings and changes  Type 2 diabetes with labile blood glucose readings-persistent -Continue on SSI as needed with Accu-Cheks every6hours -Continue carb modified diet -Appreciate diabetes coordinator recommendations;A1c noted to be 8.8% -We will need endocrinology outpatient follow-up soon for insulin pump  likely -Discussed case with diabetes coordinator and will plan to discharge on sliding scale insulin as prescribed with NovoLog pen in a.m. if she remains stable. This will be the safest plan for discharge and will hold further Levemir until seen by endocrinology in the near future.  Acute on chronic diastolic heart failure-improved -Noted to have pulmonary vascular congestion on chest x-ray and will start IV Lasix now along with oral Lasix starting tonight  DVT prophylaxis:Apixaban Code Status:Full Family Communication:Discussed with husband 9/26 Disposition Plan:Heart rates are currently stable andwe will monitor on telemetry. Delirium seems to be improved,her blood glucose levels appear to be stabilizing, but will monitor trend for another 24 hours to ensure stability prior to discharge on sliding scale insulin.   Plan to readjust PICC line today and increase Seroquel for delirium.   Consultants:  General surgery peripherally following  Procedures:  None  Antimicrobials:  Anti-infectives (From admission, onward)   Start     Dose/Rate Route Frequency Ordered Stop   05/14/19 1700  ertapenem (INVANZ) 1,000 mg in sodium chloride 0.9 % 100 mL IVPB     1 g 200 mL/hr over 30 Minutes Intravenous Every 24 hours 05/14/19 1409     05/10/19 1300  ertapenem (INVANZ) 1,000 mg in sodium chloride 0.9 % 100 mL IVPB  Status:  Discontinued     1 g 200 mL/hr over 30 Minutes Intravenous Every 24 hours 05/09/19 2201 05/14/19 1406       Subjective: Patient seen and evaluated today with ongoing delirium noted, she is calm however.  She requires repositioning of her PICC line prior to reinitiation of antibiotics as well.  She appears to have some pulmonary vascular congestion noted as well on chest x-ray 9/26.  Objective: Vitals:   05/14/19 0528 05/14/19 1350 05/14/19 2146 05/15/19 0400  BP: 105/81 129/72 (!) 144/85 (!) 100/55  Pulse: (!) 125 69 69 84  Resp: 18 18 18 18   Temp:  (!) 97.4 F (36.3 C) 98 F (36.7 C) 97.8 F (36.6 C) 97.6 F (36.4 C)  TempSrc: Oral  Oral Oral  SpO2: 95% 98% 94% 92%  Weight:      Height:        Intake/Output Summary (Last 24 hours) at 05/15/2019 1020 Last data filed at 05/15/2019 0943 Gross per 24 hour  Intake 480 ml  Output 1100 ml  Net -620 ml   Filed Weights   05/09/19 1708 05/11/19 0500  Weight: 89.8 kg 95.1 kg    Examination:  General exam: Appears calm and comfortable  Respiratory system: Clear to auscultation. Respiratory effort normal. Cardiovascular system: S1 & S2 heard, RRR. No JVD, murmurs, rubs, gallops or clicks. No pedal edema. Gastrointestinal system: Abdomen is nondistended, soft and nontender. No organomegaly or masses felt. Normal bowel sounds heard.  Abdominal wound clean dry and intact. Central nervous system: Alert and awake. Extremities: Symmetric 5 x 5 power. Skin: No rashes, lesions or ulcers Psychiatry: Difficult to assess.    Data Reviewed: I have personally reviewed following labs and imaging studies  CBC: Recent Labs  Lab 05/09/19 1744 05/10/19 0249 05/11/19 0648  WBC 7.7 7.1 5.9  NEUTROABS 6.0  --   --   HGB 8.8* 8.5* 8.2*  HCT 31.4* 30.7* 29.4*  MCV 89.5 91.1 89.1  PLT 343 322 161   Basic Metabolic Panel: Recent Labs  Lab 05/11/19 0648 05/12/19 0504 05/13/19 0549 05/14/19 0810 05/15/19 0608  NA 139 140 139 137 138  K 3.9 3.3* 5.0 4.5 4.2  CL 104 104 105 101 100  CO2 28 28 27 29 30   GLUCOSE 279* 80 99 138* 70  BUN 12 14 13 13 11   CREATININE 0.70 0.81 0.72 0.84 0.78  CALCIUM 7.3* 7.8* 7.9* 8.3* 8.5*  MG 1.2* 1.5* 1.8  --   --    GFR: Estimated Creatinine Clearance: 75.3 mL/min (by C-G formula based on SCr of 0.78 mg/dL). Liver Function Tests: Recent Labs  Lab 05/09/19 1744 05/10/19 0249  AST 13* 14*  ALT 15 13  ALKPHOS 94 88  BILITOT 0.9 0.8  PROT 5.3* 4.9*  ALBUMIN 2.1* 2.0*   No results for input(s): LIPASE, AMYLASE in the last 168 hours. No results  for input(s): AMMONIA in the last 168 hours. Coagulation Profile: No results for input(s): INR, PROTIME in the last 168 hours. Cardiac Enzymes: No results for input(s): CKTOTAL, CKMB, CKMBINDEX, TROPONINI in the last 168 hours. BNP (last 3 results) No results for input(s): PROBNP in the last 8760 hours. HbA1C: No results for input(s): HGBA1C in the last 72 hours. CBG: Recent Labs  Lab 05/14/19 1119 05/14/19 1814 05/14/19 2352 05/15/19 0402 05/15/19 0758  GLUCAP 155* 81 80 70 74   Lipid Profile: No results for input(s): CHOL, HDL, LDLCALC, TRIG, CHOLHDL, LDLDIRECT in the last 72 hours. Thyroid Function Tests: No results for input(s): TSH, T4TOTAL, FREET4, T3FREE, THYROIDAB in the last 72 hours. Anemia Panel: No results for input(s): VITAMINB12, FOLATE, FERRITIN, TIBC, IRON, RETICCTPCT in the last 72 hours. Sepsis Labs: No results for input(s): PROCALCITON, LATICACIDVEN in the last 168 hours.  Recent Results (from the past 240 hour(s))  SARS Coronavirus 2 [facility] order, Performed in [facility] hospital lab) Nasopharyngeal Nasopharyngeal Swab     Status: None   Collection Time: 05/09/19  5:37 PM   Specimen: Nasopharyngeal Swab  Result Value Ref Range Status   SARS Coronavirus 2 NEGATIVE NEGATIVE Final    Comment: (NOTE) If result is NEGATIVE SARS-CoV-2 target nucleic acids are NOT DETECTED. The SARS-CoV-2 RNA is generally detectable in upper and lower  respiratory specimens during the acute phase of infection. The lowest  concentration of SARS-CoV-2 viral copies this assay can detect is 250  copies / mL. A negative result does not preclude SARS-CoV-2 infection  and should not be used as the sole basis for treatment or other  patient management decisions.  A negative result may occur with  improper specimen collection / handling, submission of specimen other  than nasopharyngeal swab, presence of viral mutation(s) within the  areas targeted by this assay, and inadequate  number of viral copies  (<250 copies / mL). A negative result must be combined with clinical  observations, patient history, and epidemiological information. If result is POSITIVE SARS-CoV-2 target nucleic acids are DETECTED. The SARS-CoV-2 RNA is generally detectable in upper and lower  respiratory specimens dur ing the acute phase of infection.  Positive  results are indicative of active infection with SARS-CoV-2.  Clinical  correlation with patient history and other diagnostic information is  necessary to determine patient infection status.  Positive results do  not rule out bacterial infection or co-infection with other viruses. If result is PRESUMPTIVE POSTIVE SARS-CoV-2 nucleic acids MAY BE PRESENT.   A presumptive positive result was obtained on the submitted specimen  and confirmed on repeat testing.  While 2019 novel coronavirus  (SARS-CoV-2) nucleic acids may be present in the submitted sample  additional confirmatory testing may be necessary for epidemiological  and / or clinical management purposes  to differentiate between  SARS-CoV-2 and other Sarbecovirus currently known to infect humans.  If clinically indicated additional testing with an alternate test  methodology [id number]) is advised. The SARS-CoV-2 RNA is generally  detectable in upper and lower respiratory sp ecimens during the acute  phase of infection. The expected result is Negative. Fact Sheet for Patients:  StrictlyIdeas.no Fact Sheet for Healthcare Providers: BankingDealers.co.za This test is not yet approved or cleared by the [place] FDA and has been authorized for detection and/or diagnosis of SARS-CoV-2 by FDA under an Emergency Use Authorization (EUA).  This EUA will remain in effect (meaning this test can be used) for the duration of the COVID-19 declaration under Section 564(b)(1) of the Act, 21 U.S.C. section 360bbb-3(b)(1), unless the  authorization is terminated or revoked sooner. Performed at [facility], [address], [place], [place] [address]   MRSA PCR Screening     Status: Abnormal   Collection Time: 05/09/19 10:04 PM   Specimen: Nasal Mucosa; Nasopharyngeal  Result Value Ref Range Status   MRSA by PCR POSITIVE (A) NEGATIVE Final    Comment:        The GeneXpert MRSA Assay (FDA approved for NASAL specimens only), is one component of a comprehensive MRSA colonization surveillance program. It is not intended to diagnose MRSA infection nor to guide or monitor treatment for MRSA infections. RESULT CALLED TO, READ BACK BY AND VERIFIED WITH: Nicholes Rough @0529  05/10/19 [facility] Performed at [facility], [address] [place], [place] [address]          Radiology Studies: Dg Chest [facility] 1 View  Result Date: 05/14/2019 CLINICAL DATA:  Evaluate PICC line EXAM: PORTABLE CHEST 1 VIEW COMPARISON:  May 10, 2019 FINDINGS: A left PICC line is been placed in the interval. It is difficult to see the distal if the believe it terminates in the right atrium 4.5 cm below the caval atrial junction. Stable cardiomediastinal silhouette. No pneumothorax. Mild interstitial prominence centrally. IMPRESSION: 1. The left PICC line appears to terminate in the right atrium, 4.5 cm below the caval atrial junction. Recommend repositioning. 2. Suggested mild pulmonary venous congestion. These results will be called to the ordering clinician or representative by the Radiologist Assistant, and communication documented in the PACS or zVision Dashboard. Electronically Signed   By: Dorise Bullion III M.D   On: 05/14/2019 18:57        Scheduled Meds: . apixaban  5 mg Oral BID  . buPROPion  300 mg Oral Daily  . Chlorhexidine Gluconate Cloth  6 each Topical Daily  . diltiazem  120 mg Oral Daily  . escitalopram  10 mg Oral Daily  . feeding supplement (ENSURE ENLIVE)  237 mL Oral BID BM  . ferrous sulfate  325 mg Oral BID WC  .  furosemide  40 mg Oral BID  . insulin aspart  0-7 Units Subcutaneous Q6H  . metoprolol tartrate  50 mg Oral BID  . mupirocin ointment   Nasal BID  . pantoprazole  40 mg Oral QAC breakfast  . QUEtiapine  25 mg Oral QHS  . sodium chloride flush  3 mL Intravenous Q12H   Continuous Infusions: . sodium chloride    . ertapenem       LOS: 6 days    Time spent: 30 minutes    Demareon Coldwell Darleen Crocker, DO [facility] Pager [phone]  If 7PM-7AM, please contact night-coverage [email or web address] Password [email or web address] 05/15/2019, 10:20 AM

## 2019-05-16 LAB — BASIC METABOLIC PANEL
Anion gap: 13 (ref 5–15)
BUN: 10 mg/dL (ref 8–23)
CO2: 31 mmol/L (ref 22–32)
Calcium: 8.1 mg/dL — ABNORMAL LOW (ref 8.9–10.3)
Chloride: 95 mmol/L — ABNORMAL LOW (ref 98–111)
Creatinine, Ser: 0.9 mg/dL (ref 0.44–1.00)
GFR calc Af Amer: >60 mL/min (ref >60)
GFR calc non Af Amer: >60 mL/min (ref >60)
Glucose, Bld: 93 mg/dL (ref 70–99)
Potassium: 3.3 mmol/L — ABNORMAL LOW (ref 3.5–5.1)
Sodium: 139 mmol/L (ref 135–145)

## 2019-05-16 LAB — CBC
HCT: 30.3 % — ABNORMAL LOW (ref 36.0–46.0)
Hemoglobin: 9 g/dL — ABNORMAL LOW (ref 12.0–15.0)
MCH: 25.3 pg — ABNORMAL LOW (ref 26.0–34.0)
MCHC: 29.7 g/dL — ABNORMAL LOW (ref 30.0–36.0)
MCV: 85.1 fL (ref 80.0–100.0)
Platelets: 371 10*3/uL (ref 150–400)
RBC: 3.56 MIL/uL — ABNORMAL LOW (ref 3.87–5.11)
RDW: 16.4 % — ABNORMAL HIGH (ref 11.5–15.5)
WBC: 9.9 10*3/uL (ref 4.0–10.5)
nRBC: 0 % (ref 0.0–0.2)

## 2019-05-16 LAB — GLUCOSE, CAPILLARY
Glucose-Capillary: 94 mg/dL (ref 70–99)
Glucose-Capillary: 89 mg/dL (ref 70–99)
Glucose-Capillary: 81 mg/dL (ref 70–99)
Glucose-Capillary: 94 mg/dL (ref 70–99)
Glucose-Capillary: 90 mg/dL (ref 70–99)

## 2019-05-16 LAB — TSH: TSH: 1.071 u[IU]/mL (ref 0.350–4.500)

## 2019-05-16 LAB — BLOOD GAS, VENOUS
Acid-Base Excess: 12.8 mmol/L — ABNORMAL HIGH (ref 0.0–2.0)
Bicarbonate: 35.2 mmol/L — ABNORMAL HIGH (ref 20.0–28.0)
FIO2: 21
O2 Saturation: 45.5 %
Patient temperature: 37
pCO2, Ven: 46.8 mmHg (ref 44.0–60.0)
pH, Ven: 7.508 — ABNORMAL HIGH (ref 7.250–7.430)
pO2, Ven: <31 mmHg — CL (ref 32.0–45.0)

## 2019-05-16 LAB — AMMONIA: Ammonia: 16 umol/L (ref 9–35)

## 2019-05-16 LAB — MAGNESIUM: Magnesium: 1.2 mg/dL — ABNORMAL LOW (ref 1.7–2.4)

## 2019-05-16 MED ORDER — POTASSIUM CHLORIDE CRYS ER 20 MEQ PO TBCR
40.0000 meq | EXTENDED_RELEASE_TABLET | Freq: Once | ORAL | Status: AC
  Administered 2019-05-16: 09:00:00 40 meq via ORAL
  Filled 2019-05-16: qty 2

## 2019-05-16 MED ORDER — SODIUM CHLORIDE 0.9% FLUSH
10.0000 mL | Freq: Two times a day (BID) | INTRAVENOUS | Status: DC
  Administered 2019-05-16 – 2019-05-19 (×5): 10 mL
  Administered 2019-05-20: 40 mL
  Administered 2019-05-20 – 2019-05-25 (×10): 10 mL

## 2019-05-16 MED ORDER — MAGNESIUM SULFATE 2 GM/50ML IV SOLN
2.0000 g | Freq: Once | INTRAVENOUS | Status: AC
  Administered 2019-05-16: 2 g via INTRAVENOUS
  Filled 2019-05-16: qty 50

## 2019-05-16 MED ORDER — SODIUM CHLORIDE 0.9% FLUSH
10.0000 mL | INTRAVENOUS | Status: DC | PRN
  Administered 2019-05-17: 10 mL
  Filled 2019-05-16: qty 40

## 2019-05-16 NOTE — Progress Notes (Signed)
PROGRESS NOTE    [name]  WUJ:[id number] DOB: [birth year] DOA: 05/09/2019 PCP: [name], MD   Brief Narrative:  Per HPI: [name] a68 y.o.female,with history of atrial fibrillation on anticoagulation therapy with apixaban, diabetes mellitus type 2, LV thrombus, hypertension, depression, abdominal wound infection,? Pyoderma gangrenosum, currently on IV Invanz. Patient was recently discharged from the hospital on 04/30/2019 when she was admitted with abdominal wound infection and was discharged on Invanz for 21 days. As per patient she also has been taking high-dose prednisone which was prescribed by general surgery on 03/09/2019 after she had debridement of the abdominal wound and biopsy came back? Pyoderma gangrenosum.  As per patient, she noticed that her blood sugar has been very high at home she takes 60 mg of prednisone daily. She was not discharged on prednisone on 04/30/2019 as per discharge summary.  She denies chest pain but complains of palpitations. Admits to having shortness of breath. Complains of nausea but denies vomiting and diarrhea. Denies abdominal pain or dysuria.  In the ED patient started on IV Cardizem infusion for atrial fibrillation with RVR.  9/22: Patient noted to have significant hypoglycemia. She is having very poor oral intake overall. Heart rate appears to be better controlled.  9/23: Hypoglycemia has improved as well as heart rate. She still is on minimal dose of IV Cardizem which will not be discontinued and hopefully, patient can be transferred to telemetry floor today. She is noted to have some mild delirium as well.  9/24: Patient continued to have episodes of hypoglycemia overnight and required D10 infusion temporarily. She continues to eat well and has less delirium today. Potassium and magnesium still a little bit on the low side.  9/25:Patient appears to be stabilizing from a blood glucose standpoint on  sliding scale, but will monitor 24 more hours to ensure stability prior to discharge. She also appears to have less delirium and potassium is improved.  9/26:Patient appears to be stable from a blood glucose standpoint and her heart rate is controlled. Unfortunately, she is having some persistent confusion and delirium. Her PICC line is also nonfunctioning at this point and will require an exchange prior to going back home.  9/27: Patient was given Seroquel overnight, but is still confused and disoriented.  Heart rate and blood glucose is well controlled at this point.  PICC line will require repositioning by PICC team today.  We will plan to diurese further and increased dose of Seroquel tonight.  9/28: Patient has worsening confusion and persistent encephalopathy after being given full dose of Seroquel last night.  We will plan to hold this at the moment.  Will do other work-up and continue monitoring.  PT evaluation with possible need for SNF on discharge.  Assessment & Plan:   Active Problems:   Atrial fibrillation with RVR (HCC)   Atrial fibrillation with RVR-resolved -Monitor on telemetry -Restart home oral Cardizem as well today -Continue Eliquis for anticoagulation -Continue metoprolol p.o. 50 mg twice daily  Mild delirium-persistent -Continue to monitor it is likely due to current hospitalization -We will transfer to telemetry today and hopefully this will help -Urine without signs of UTI and will hold Seroquel for now as this does not appear to be helping -We will check ammonia, TSH, and venous blood gas -PT evaluation  Mild hypokalemia-improve -Potassium repletion will be ordered as well as magnesium -Recheck a.m. labs  Chronic abdominal wound with pyoderma gangrenosum -Continue IV Invanz as previously prescribed once PICC team readjusts access -PICC linescheduled  for exchange today as this is not flushing -Appreciate general surgery recommendations and will  continue wound dressings and changes  Type 2 diabetes with labile blood glucose readings-persistent -Continue on SSI as needed with Accu-Cheks every6hours -Continue carb modified diet -Appreciate diabetes coordinator recommendations;A1c noted to be 8.8% -We will need endocrinology outpatient follow-up soon for insulin pump likely -Discussed case with diabetes coordinator and will plan to discharge on sliding scale insulin as prescribed with NovoLog pen in a.m. if she remains stable. This will be the safest plan for discharge and will hold further Levemir until seen by endocrinology in the near future.  Acute on chronic diastolic heart failure-improved -Noted to have pulmonary vascular congestion on chest x-ray and will start IV Lasix now along with oral Lasix starting tonight  DVT prophylaxis:Apixaban Code Status:Full Family Communication:Discussed with husband 9/27 Disposition Plan:Heart rates are currently stable andwe will monitor on telemetry. Delirium seems to be improved,her blood glucose levels appear to be stabilizing, but will monitor trend for another 24 hours to ensure stability prior to discharge on sliding scale insulin.    PICC line adjusted 9/27.  Discontinue Seroquel and do further encephalopathy work-up as noted above.  PT evaluation with possible need for placement.   Consultants:  General surgery peripherally following  Procedures:  None  Antimicrobials:  Anti-infectives (From admission, onward)   Start     Dose/Rate Route Frequency Ordered Stop   05/14/19 1700  ertapenem (INVANZ) 1,000 mg in sodium chloride 0.9 % 100 mL IVPB     1 g 200 mL/hr over 30 Minutes Intravenous Every 24 hours 05/14/19 1409     05/10/19 1300  ertapenem (INVANZ) 1,000 mg in sodium chloride 0.9 % 100 mL IVPB  Status:  Discontinued     1 g 200 mL/hr over 30 Minutes Intravenous Every 24 hours 05/09/19 2201 05/14/19 1406       Subjective: Patient seen and evaluated  today with no new acute complaints or concerns. No acute concerns or events noted overnight.  Blood glucose and heart rate remained stable, however patient still quite confused and persistently encephalopathic.  Objective: Vitals:   05/16/19 0523 05/16/19 0527 05/16/19 0551 05/16/19 0816  BP:   123/62   Pulse: 82 92    Resp: 18     Temp: 98.3 F (36.8 C)     TempSrc: Oral     SpO2:  97%  98%  Weight:      Height:        Intake/Output Summary (Last 24 hours) at 05/16/2019 1117 Last data filed at 05/16/2019 0648 Gross per 24 hour  Intake 443 ml  Output 2650 ml  Net -2207 ml   Filed Weights   05/09/19 1708 05/11/19 0500  Weight: 89.8 kg 95.1 kg    Examination:  General exam: Confused Respiratory system: Clear to auscultation. Respiratory effort normal. Cardiovascular system: S1 & S2 heard, RRR. No JVD, murmurs, rubs, gallops or clicks. No pedal edema. Gastrointestinal system: Abdomen is nondistended, soft and nontender. No organomegaly or masses felt. Normal bowel sounds heard.  Abdominal wound clean dry and intact Central nervous system: Confused, but alert Extremities: Symmetric 5 x 5 power. Skin: No rashes, lesions or ulcers Psychiatry: Appears confused    Data Reviewed: I have personally reviewed following labs and imaging studies  CBC: Recent Labs  Lab 05/09/19 1744 05/10/19 0249 05/11/19 0648 05/16/19 0537  WBC 7.7 7.1 5.9 9.9  NEUTROABS 6.0  --   --   --   HGB  8.8* 8.5* 8.2* 9.0*  HCT 31.4* 30.7* 29.4* 30.3*  MCV 89.5 91.1 89.1 85.1  PLT 343 322 338 841   Basic Metabolic Panel: Recent Labs  Lab 05/11/19 0648 05/12/19 0504 05/13/19 0549 05/14/19 0810 05/15/19 0608 05/16/19 0537  NA 139 140 139 137 138 139  K 3.9 3.3* 5.0 4.5 4.2 3.3*  CL 104 104 105 101 100 95*  CO2 28 28 27 29 30 31   GLUCOSE 279* 80 99 138* 70 93  BUN 12 14 13 13 11 10   CREATININE 0.70 0.81 0.72 0.84 0.78 0.90  CALCIUM 7.3* 7.8* 7.9* 8.3* 8.5* 8.1*  MG 1.2* 1.5* 1.8  --   --   1.2*   GFR: Estimated Creatinine Clearance: 67 mL/min (by C-G formula based on SCr of 0.9 mg/dL). Liver Function Tests: Recent Labs  Lab 05/09/19 1744 05/10/19 0249  AST 13* 14*  ALT 15 13  ALKPHOS 94 88  BILITOT 0.9 0.8  PROT 5.3* 4.9*  ALBUMIN 2.1* 2.0*   No results for input(s): LIPASE, AMYLASE in the last 168 hours. No results for input(s): AMMONIA in the last 168 hours. Coagulation Profile: No results for input(s): INR, PROTIME in the last 168 hours. Cardiac Enzymes: No results for input(s): CKTOTAL, CKMB, CKMBINDEX, TROPONINI in the last 168 hours. BNP (last 3 results) No results for input(s): PROBNP in the last 8760 hours. HbA1C: No results for input(s): HGBA1C in the last 72 hours. CBG: Recent Labs  Lab 05/15/19 1606 05/15/19 2341 05/16/19 0413 05/16/19 0536 05/16/19 0738  GLUCAP 93 73 94 89 81   Lipid Profile: No results for input(s): CHOL, HDL, LDLCALC, TRIG, CHOLHDL, LDLDIRECT in the last 72 hours. Thyroid Function Tests: No results for input(s): TSH, T4TOTAL, FREET4, T3FREE, THYROIDAB in the last 72 hours. Anemia Panel: No results for input(s): VITAMINB12, FOLATE, FERRITIN, TIBC, IRON, RETICCTPCT in the last 72 hours. Sepsis Labs: No results for input(s): PROCALCITON, LATICACIDVEN in the last 168 hours.  Recent Results (from the past 240 hour(s))  SARS Coronavirus 2 [facility] order, Performed in [facility] hospital lab) Nasopharyngeal Nasopharyngeal Swab     Status: None   Collection Time: 05/09/19  5:37 PM   Specimen: Nasopharyngeal Swab  Result Value Ref Range Status   SARS Coronavirus 2 NEGATIVE NEGATIVE Final    Comment: (NOTE) If result is NEGATIVE SARS-CoV-2 target nucleic acids are NOT DETECTED. The SARS-CoV-2 RNA is generally detectable in upper and lower  respiratory specimens during the acute phase of infection. The lowest  concentration of SARS-CoV-2 viral copies this assay can detect is 250  copies / mL. A negative result does not  preclude SARS-CoV-2 infection  and should not be used as the sole basis for treatment or other  patient management decisions.  A negative result may occur with  improper specimen collection / handling, submission of specimen other  than nasopharyngeal swab, presence of viral mutation(s) within the  areas targeted by this assay, and inadequate number of viral copies  (<250 copies / mL). A negative result must be combined with clinical  observations, patient history, and epidemiological information. If result is POSITIVE SARS-CoV-2 target nucleic acids are DETECTED. The SARS-CoV-2 RNA is generally detectable in upper and lower  respiratory specimens dur ing the acute phase of infection.  Positive  results are indicative of active infection with SARS-CoV-2.  Clinical  correlation with patient history and other diagnostic information is  necessary to determine patient infection status.  Positive results do  not rule out bacterial  infection or co-infection with other viruses. If result is PRESUMPTIVE POSTIVE SARS-CoV-2 nucleic acids MAY BE PRESENT.   A presumptive positive result was obtained on the submitted specimen  and confirmed on repeat testing.  While 2019 novel coronavirus  (SARS-CoV-2) nucleic acids may be present in the submitted sample  additional confirmatory testing may be necessary for epidemiological  and / or clinical management purposes  to differentiate between  SARS-CoV-2 and other Sarbecovirus currently known to infect humans.  If clinically indicated additional testing with an alternate test  methodology [phone]) is advised. The SARS-CoV-2 RNA is generally  detectable in upper and lower respiratory sp ecimens during the acute  phase of infection. The expected result is Negative. Fact Sheet for Patients:  StrictlyIdeas.no Fact Sheet for Healthcare Providers: BankingDealers.co.za This test is not yet approved or cleared by  the [place] FDA and has been authorized for detection and/or diagnosis of SARS-CoV-2 by FDA under an Emergency Use Authorization (EUA).  This EUA will remain in effect (meaning this test can be used) for the duration of the COVID-19 declaration under Section 564(b)(1) of the Act, 21 U.S.C. section 360bbb-3(b)(1), unless the authorization is terminated or revoked sooner. Performed at [facility], [address], [place], [place] [address]   MRSA PCR Screening     Status: Abnormal   Collection Time: 05/09/19 10:04 PM   Specimen: Nasal Mucosa; Nasopharyngeal  Result Value Ref Range Status   MRSA by PCR POSITIVE (A) NEGATIVE Final    Comment:        The GeneXpert MRSA Assay (FDA approved for NASAL specimens only), is one component of a comprehensive MRSA colonization surveillance program. It is not intended to diagnose MRSA infection nor to guide or monitor treatment for MRSA infections. RESULT CALLED TO, READ BACK BY AND VERIFIED WITH: Nicholes Rough @0529  05/10/19 [facility] Performed at [facility], [address] [place], [place] [address]          Radiology Studies: Dg Chest [place] 1 View  Addendum Date: 05/15/2019   ADDENDUM REPORT: 05/15/2019 11:10 ADDENDUM: The technologist reversed the image and incorrectly labeled the laterality on the image obtained at 1025 hours. A repeat x-ray was obtained at 1051 hours and appropriately labeled. With this new information, it is clear that the patient has a left PICC line, not a right PICC line. The left PICC line tip overlies the right cardiomediastinal silhouette, superimposed on the region of the distal SVC near the junction with the RA. Cardiopericardial silhouette is stable in size. Pulmonary edema pattern again noted. Bones are diffusely demineralized with chronic posttraumatic deformity in the proximal left humerus. IMPRESSION: Left PICC line tip is superimposed on the expected location of the distal SVC. Electronically Signed    By: Misty Stanley M.D.   On: 05/15/2019 11:10   Result Date: 05/15/2019 CLINICAL DATA:  PICC line placement EXAM: PORTABLE CHEST 1 VIEW COMPARISON:  05/14/2019 FINDINGS: 1025 hours. Patient is markedly rotated to the left. The left PICC line seen previously is no longer evident and a right PICC line is now visible. The tip of the right PICC line projects over the left cardiomediastinal shadow on this exam. Given the marked rotation, tip position cannot be localized. Cardiopericardial silhouette is enlarged. Similar pulmonary edema pattern. No substantial pleural effusion. IMPRESSION: Right PICC line is new in the interval. Tip position cannot be adequately assessed given the marked leftward rotation. The tip of the PICC line projects too far to the left but given the presumed  anterior positioning of the line, this may simply be related to the rotation. Repeat chest x-ray recommended with careful attention to AP positioning to better assess PICC line location. Electronically Signed: By: Misty Stanley M.D. On: 05/15/2019 10:49   Dg Chest Port 1 View  Result Date: 05/14/2019 CLINICAL DATA:  Evaluate PICC line EXAM: PORTABLE CHEST 1 VIEW COMPARISON:  May 10, 2019 FINDINGS: A left PICC line is been placed in the interval. It is difficult to see the distal if the believe it terminates in the right atrium 4.5 cm below the caval atrial junction. Stable cardiomediastinal silhouette. No pneumothorax. Mild interstitial prominence centrally. IMPRESSION: 1. The left PICC line appears to terminate in the right atrium, 4.5 cm below the caval atrial junction. Recommend repositioning. 2. Suggested mild pulmonary venous congestion. These results will be called to the ordering clinician or representative by the Radiologist Assistant, and communication documented in the PACS or zVision Dashboard. Electronically Signed   By: Dorise Bullion III M.D   On: 05/14/2019 18:57        Scheduled Meds: . apixaban  5 mg Oral  BID  . buPROPion  300 mg Oral Daily  . Chlorhexidine Gluconate Cloth  6 each Topical Daily  . diltiazem  120 mg Oral Daily  . escitalopram  10 mg Oral Daily  . feeding supplement (ENSURE ENLIVE)  237 mL Oral BID BM  . ferrous sulfate  325 mg Oral BID WC  . furosemide  40 mg Oral BID  . insulin aspart  0-7 Units Subcutaneous Q6H  . metoprolol tartrate  50 mg Oral BID  . mupirocin ointment   Nasal BID  . pantoprazole  40 mg Oral QAC breakfast  . sodium chloride flush  3 mL Intravenous Q12H   Continuous Infusions: . sodium chloride    . ertapenem 1,000 mg (05/15/19 1616)     LOS: 7 days    Time spent: 30 minutes    Klever Twyford Darleen Crocker, DO [facility] Pager [phone]  If 7PM-7AM, please contact night-coverage [email or web address] Password [facility] 05/16/2019, 11:17 AM

## 2019-05-16 NOTE — Plan of Care (Signed)
  Problem: Acute Rehab PT Goals(only PT should resolve) Goal: Pt Will Go Supine/Side To Sit Outcome: Progressing Flowsheets (Taken 05/16/2019 1504) Pt will go Supine/Side to Sit:  with minimal assist  with moderate assist Goal: Patient Will Transfer Sit To/From Stand Outcome: Progressing Flowsheets (Taken 05/16/2019 1504) Patient will transfer sit to/from stand:  with minimal assist  with moderate assist Goal: Pt Will Transfer Bed To Chair/Chair To Bed Outcome: Progressing Flowsheets (Taken 05/16/2019 1504) Pt will Transfer Bed to Chair/Chair to Bed: with mod assist Goal: Pt Will Ambulate Outcome: Progressing Flowsheets (Taken 05/16/2019 1504) Pt will Ambulate:  15 feet  with moderate assist  with rolling walker   3:05 PM, 05/16/19 Lonell Grandchild, MPT Physical Therapist with [facility] [phone] office [phone] mobile phone

## 2019-05-16 NOTE — TOC Progression Note (Signed)
Transition of Care [facility]) - Progression Note    Patient Details  Name: [name] MRN: [id number] Date of Birth: 11/19/1950  Transition of Care [facility] CM/SW Contact  Shade Flood, LCSW Phone Number: 05/16/2019, 2:47 PM  Clinical Narrative:     PT recommending SNF rehab. Spoke with pt's husband by phone due to pt's delirium. He states that he will be at the hospital this evening and will see how pt is doing and if he thinks he can manage her at home. He asks for [facility] to call him tomorrow for an update.   Assigned LCSW will follow up tomorrow and make SNF referrals if pt and her husband desire.  Expected Discharge Plan: [place]    Expected Discharge Plan and Services Expected Discharge Plan: [place]                                               Social Determinants of Health (SDOH) Interventions    Readmission Risk Interventions Readmission Risk Prevention Plan 05/10/2019 04/30/2019 04/29/2019  Transportation Screening Complete Complete Complete  Medication Review Press photographer) Complete Complete Complete  PCP or Specialist appointment within 3-5 days of discharge Complete Complete Not Complete  PCP/Specialist Appt Not Complete comments Going tomorrow after discharge. - -  HRI or Home Care Consult Complete Complete Complete  SW Recovery Care/Counseling Consult Complete Patient refused Complete  Palliative Care Screening - Not Applicable Not Complete  Skilled Nursing Facility Not Complete Not Applicable Not Complete  Some recent data might be hidden

## 2019-05-16 NOTE — Evaluation (Signed)
Physical Therapy Evaluation Patient Details Name: [name] MRN: [id number] DOB: 04-09-1951 Today's Date: 05/16/2019   History of Present Illness  [name]  is a 68 y.o. female, with history of atrial fibrillation on anticoagulation therapy with apixaban, diabetes mellitus type 2, LV thrombus, hypertension, depression, abdominal wound infection,?  Pyoderma gangrenosum, currently on IV Invanz.  Patient was recently discharged from the hospital on 04/30/2019 when she was admitted with abdominal wound infection and was discharged on Invanz for 21 days.  As per patient she also has been taking high-dose prednisone which was prescribed by general surgery on 03/09/2019 after she had debridement of the abdominal wound and biopsy came back?  Pyoderma gangrenosum.    Clinical Impression  Patient very weak and unable to maintain sitting balance while seated at bedside, c/o severe abdominal pain during bed mobility and unable to attempt sit to stands or transfers.  Patient put back to bed and required Max/total assist to reposition with bed in head down position.  Patient will benefit from continued physical therapy in hospital and recommended venue below to increase strength, balance, endurance for safe ADLs and gait.    Follow Up Recommendations SNF    Equipment Recommendations  None recommended by PT    Recommendations for Other Services       Precautions / Restrictions Precautions Precautions: Fall Restrictions Weight Bearing Restrictions: No      Mobility  Bed Mobility Overal bed mobility: Needs Assistance Bed Mobility: Supine to Sit;Sit to Supine     Supine to sit: Max assist Sit to supine: Max assist   General bed mobility comments: very unsteady with poor carryover for propping up onto elbows  Transfers                    Ambulation/Gait                Stairs            Wheelchair Mobility    Modified [place] (Stroke Patients Only)        Balance Overall balance assessment: Needs assistance Sitting-balance support: Feet supported;No upper extremity supported Sitting balance-Leahy Scale: Poor Sitting balance - Comments: fair/poor with frequent falling backwards Postural control: Posterior lean                                   Pertinent Vitals/Pain Pain Assessment: Faces Faces Pain Scale: Hurts whole lot Pain Location: abdomen at surgical site Pain Descriptors / Indicators: Sore;Grimacing;Guarding Pain Intervention(s): Limited activity within patient's tolerance;Monitored during session    Home Living Family/patient expects to be discharged to:: Private residence   Available Help at Discharge: Family;Available 24 hours/day;Available PRN/intermittently Type of Home: House Home Access: Ramped entrance     Home Layout: One level Home Equipment: Walker - standard;Bedside commode;Grab bars - tub/shower;Shower seat;Wheelchair - manual;Tub bench;Walker - 2 wheels      Prior Function Level of Independence: Needs assistance   Gait / Transfers Assistance Needed: household ambulator, uses w/c for longer distances  ADL's / Homemaking Assistance Needed: assisted by spouse        Hand Dominance   Dominant Hand: Right    Extremity/Trunk Assessment   Upper Extremity Assessment Upper Extremity Assessment: Generalized weakness    Lower Extremity Assessment Lower Extremity Assessment: Generalized weakness    Cervical / Trunk Assessment Cervical / Trunk Assessment: Normal  Communication   Communication: No difficulties  Cognition  Arousal/Alertness: Awake/alert Behavior During Therapy: WFL for tasks assessed/performed;Anxious Overall Cognitive Status: No family/caregiver present to determine baseline cognitive functioning                                        General Comments      Exercises     Assessment/Plan    PT Assessment Patient needs continued PT services  PT  Problem List Decreased strength;Decreased activity tolerance;Decreased balance;Decreased mobility       PT Treatment Interventions Balance training;Gait training;Functional mobility training;Therapeutic activities;Therapeutic exercise;Patient/family education    PT Goals (Current goals can be found in the Care Plan section)  Acute Rehab PT Goals Patient Stated Goal: return home with family to assist PT Goal Formulation: With patient Time For Goal Achievement: 05/29/19 Potential to Achieve Goals: Fair    Frequency Min 3X/week   Barriers to discharge        Co-evaluation               AM-PAC PT "6 Clicks" Mobility  Outcome Measure Help needed turning from your back to your side while in a flat bed without using bedrails?: A Lot Help needed moving from lying on your back to sitting on the side of a flat bed without using bedrails?: A Lot Help needed moving to and from a bed to a chair (including a wheelchair)?: Total Help needed standing up from a chair using your arms (e.g., wheelchair or bedside chair)?: Total Help needed to walk in hospital room?: Total Help needed climbing 3-5 steps with a railing? : Total 6 Click Score: 8    End of Session   Activity Tolerance: Patient limited by fatigue;Patient limited by pain Patient left: in bed;with call bell/phone within reach;with bed alarm set Nurse Communication: Mobility status PT Visit Diagnosis: Unsteadiness on feet (R26.81);Other abnormalities of gait and mobility (R26.89);Muscle weakness (generalized) (M62.81)    Time: 6283-1517 PT Time Calculation (min) (ACUTE ONLY): 29 min   Charges:   PT Evaluation $PT Eval Moderate Complexity: 1 Mod PT Treatments $Therapeutic Activity: 23-37 mins        3:03 PM, 05/16/19 Lonell Grandchild, MPT Physical Therapist with [facility] [phone] office [phone] mobile phone

## 2019-05-17 ENCOUNTER — Inpatient Hospital Stay (HOSPITAL_COMMUNITY)
Admit: 2019-05-17 | Discharge: 2019-05-17 | Disposition: A | Discharge status: Home / Self Care | Payer: BC Managed Care – PPO | Attending: Internal Medicine | Admitting: Internal Medicine

## 2019-05-17 ENCOUNTER — Inpatient Hospital Stay (HOSPITAL_COMMUNITY): Payer: BC Managed Care – PPO

## 2019-05-17 DIAGNOSIS — R4182 Altered mental status, unspecified: Secondary | ICD-10-CM | POA: Diagnosis not present

## 2019-05-17 LAB — CBC
HCT: 34.6 % — ABNORMAL LOW (ref 36.0–46.0)
Hemoglobin: 10 g/dL — ABNORMAL LOW (ref 12.0–15.0)
MCH: 25.2 pg — ABNORMAL LOW (ref 26.0–34.0)
MCHC: 28.9 g/dL — ABNORMAL LOW (ref 30.0–36.0)
MCV: 87.2 fL (ref 80.0–100.0)
Platelets: 421 10*3/uL — ABNORMAL HIGH (ref 150–400)
RBC: 3.97 MIL/uL (ref 3.87–5.11)
RDW: 16.8 % — ABNORMAL HIGH (ref 11.5–15.5)
WBC: 10.8 10*3/uL — ABNORMAL HIGH (ref 4.0–10.5)
nRBC: 0 % (ref 0.0–0.2)

## 2019-05-17 LAB — GLUCOSE, CAPILLARY
Glucose-Capillary: 101 mg/dL — ABNORMAL HIGH (ref 70–99)
Glucose-Capillary: 110 mg/dL — ABNORMAL HIGH (ref 70–99)
Glucose-Capillary: 114 mg/dL — ABNORMAL HIGH (ref 70–99)
Glucose-Capillary: 134 mg/dL — ABNORMAL HIGH (ref 70–99)
Glucose-Capillary: 98 mg/dL (ref 70–99)

## 2019-05-17 LAB — BASIC METABOLIC PANEL
Anion gap: 13 (ref 5–15)
BUN: 9 mg/dL (ref 8–23)
CO2: 33 mmol/L — ABNORMAL HIGH (ref 22–32)
Calcium: 8.1 mg/dL — ABNORMAL LOW (ref 8.9–10.3)
Chloride: 94 mmol/L — ABNORMAL LOW (ref 98–111)
Creatinine, Ser: 1.03 mg/dL — ABNORMAL HIGH (ref 0.44–1.00)
GFR calc Af Amer: >60 mL/min (ref >60)
GFR calc non Af Amer: 56 mL/min — ABNORMAL LOW (ref >60)
Glucose, Bld: 111 mg/dL — ABNORMAL HIGH (ref 70–99)
Potassium: 3.4 mmol/L — ABNORMAL LOW (ref 3.5–5.1)
Sodium: 140 mmol/L (ref 135–145)

## 2019-05-17 LAB — MAGNESIUM: Magnesium: 1.5 mg/dL — ABNORMAL LOW (ref 1.7–2.4)

## 2019-05-17 MED ORDER — POTASSIUM CHLORIDE CRYS ER 20 MEQ PO TBCR
40.0000 meq | EXTENDED_RELEASE_TABLET | Freq: Once | ORAL | Status: AC
  Administered 2019-05-17: 10:00:00 40 meq via ORAL
  Filled 2019-05-17: qty 2

## 2019-05-17 MED ORDER — SODIUM CHLORIDE 0.9 % IV SOLN
INTRAVENOUS | Status: AC
  Administered 2019-05-17: 10:00:00 via INTRAVENOUS

## 2019-05-17 MED ORDER — MAGNESIUM SULFATE 2 GM/50ML IV SOLN
2.0000 g | Freq: Once | INTRAVENOUS | Status: AC
  Administered 2019-05-17: 10:00:00 2 g via INTRAVENOUS
  Filled 2019-05-17: qty 50

## 2019-05-17 NOTE — Procedures (Signed)
Patient Name: [name]  MRN: [id number]  Epilepsy Attending: Lora Havens  Referring Physician/Provider: Dr Heath Lark Date: 05/17/2019 Duration: 23.33 mins  Patient history: 68yo F with ams. EEG to ordered to evaluate for seizure.  Level of alertness: Lethargic  AEDs during EEG study: None  Technical aspects: This EEG study was done with scalp electrodes positioned according to the 10-20 International system of electrode placement. Electrical activity was acquired at a sampling rate of 500Hz  and reviewed with a high frequency filter of 70Hz  and a low frequency filter of 1Hz . EEG data were recorded continuously and digitally stored.   DESCRIPTION: EEG showed continuous generalized 2-5Hz  theta-delta slowing admixed with 13-15Hz  diffuse beta activity, maximal frontocentral. No clear posterior dominant rhythm was seen. Hyperventilation and photic stimulation were not performed.   ABNORMALITY: 1. Continuous slow, generalized 2. Excessive beta, generalized  IMPRESSION: This study is suggestive of moderate diffuse encephalopathy, non specific to etiology.  No seizures or epileptiform discharges were seen throughout the recording.  Casmere Hollenbeck Barbra Sarks

## 2019-05-17 NOTE — Consult Note (Signed)
Katrina A. Merlene Laughter, [name]     [email or web address]          [name] is an 68 y.o. female.   ASSESSMENT/PLAN: 1.  Altered mental status due to toxic metabolic encephalopathy/delirium in the setting of acute medical illness.  No additional neurological work-up is recommended.    The patient is a 68 year old white female who is admitted to the hospital for complications of wound infection.  She does have a history of diabetes and has had steroids along with antibiotics for her treatment.  She has been noted to have episodic confusion and altered mentation in the hospital and hence a consultation.  She does not complain of having focal neurological deficit.  There are no reports of focal weakness or numbness.  She does not report dysarthria or dysphasia.  She complains of significant abdominal pain.  She does cooperate mostly with evaluation although sometimes she is incoherent.  She does seem to have significant pain on examination of the lower extremities.  There are no reports of chest pain or shortness of breath at this time.  The review of system is otherwise negative.    GENERAL: The patient is laying in bed talking to herself but is easily redirected.  She cooperates with evaluation for the most part.  HEENT: The neck is supple no trauma appreciated.  ABDOMEN: There is a sensitive dressing involving the abdominal wall especially on the right side.  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  There is significant pain with manipulation of the joints of the lower extremities.  BACK: Normal  SKIN: Normal by inspection.    MENTAL STATUS: She lays in bed with eyes closed but opens her eyes easily to verbal commands.  She knows she is in the hospital and is oriented to the month and the year.  She is mostly lucid but occasionally speech becomes nonsensical.  CRANIAL NERVES: Pupils are equal, round and reactive to light  and accomodation; extra ocular movements are full, there is no significant nystagmus; visual fields are full; upper and lower facial muscles are normal in strength and symmetric, there is no flattening of the nasolabial folds; tongue is midline.  MOTOR: She has antigravity strength of the upper and lower extremities graded as 4/5 both proximally and distally.  Bulk and tone are normal throughout. COORDINATION: Left finger to nose is normal, right finger to nose is normal, No rest tremor; no intention tremor; no postural tremor; no bradykinesia.  REFLEXES: Deep tendon reflexes are symmetrical and normal. Plantar reflexes are flexor bilaterally.   SENSATION: She responds to painful stimuli bilaterally.       Blood pressure 98/68, pulse 80, temperature 97.7 F (36.5 C), temperature source Oral, resp. rate 20, height 5' 4"  (1.626 m), weight 87 kg, SpO2 100 %.  Past Medical History:  Diagnosis Date  . Atrial fibrillation ([place])   . CHF (congestive heart failure) ([place])   . Depression   . Diabetes mellitus without complication ([place])   . 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 Hypertension   . Morbid obesity ([place])   . Osteoporosis   . Urinary incontinence   . UTI (lower urinary tract infection) 01/2016   Cipro for Klebsiella pneumoniae isolate  . Wheelchair bound    bedbound    Past Surgical History:  Procedure Laterality Date  . ABDOMINAL HYSTERECTOMY    . APPLICATION OF WOUND VAC  03/09/2019   Procedure:  APPLICATION OF WOUND VAC;  Surgeon: Virl Cagey, [name];  Location: [facility];  Service: General;;  . BIOPSY  09/06/2018   Procedure: BIOPSY;  Surgeon: Danie Binder, [name];  [facility];  Service: Endoscopy;;  gastric bx's  . CESAREAN SECTION    . CHOLECYSTECTOMY    . ESOPHAGEAL DILATION  09/06/2018   Procedure: ESOPHAGEAL DILATION;  Surgeon: Danie Binder, [name];  [facility];  Service: Endoscopy;;  .  ESOPHAGOGASTRODUODENOSCOPY (EGD) WITH PROPOFOL N/A 09/06/2018   Procedure: ESOPHAGOGASTRODUODENOSCOPY (EGD) WITH PROPOFOL;  Surgeon: Danie Binder, [name];  [facility];  Service: Endoscopy;  Laterality: N/A;  dilatation  . FEMUR IM NAIL Left 02/20/2016  . FEMUR IM NAIL Left 02/20/2016   Procedure: INTRAMEDULLARY (IM) RETROGRADE FEMORAL NAILING;  Surgeon: Leandrew Koyanagi, [name];  Location: [place];  Service: Orthopedics;  Laterality: Left;  . PANCREAS SURGERY  1967   1 cyst excised and one cyst drained  . TEE WITHOUT CARDIOVERSION N/A 04/05/2018   Procedure: TRANSESOPHAGEAL ECHOCARDIOGRAM (TEE);  Surgeon: Dorothy Spark, [name];  Location: [facility];  Service: Cardiovascular;  Laterality: N/A;  . [place]    . WOUND DEBRIDEMENT N/A 03/09/2019   Procedure: EXCISIONAL DEBRIDEMENT OF ABDOMINAL WOUND ULCERS;  Surgeon: Virl Cagey, [name];  Location: [facility];  Service: General;  Laterality: N/A;  . WRIST FRACTURE SURGERY      Family History  Problem Relation Age of Onset  . Diabetes Mother        died in her 22's of a stroke  . Cancer Mother   . Stroke Mother   . Breast cancer Mother   . Diabetes Father        died in his 27's of a stroke.  . Stroke Father   . Diabetes Brother        died @ 75 of a stroke.  . Stroke Brother   . Diabetes Maternal Grandmother   . Diabetes Maternal Grandfather   . Diabetes Paternal Grandmother   . Diabetes Paternal Grandfather   . Breast cancer Maternal Aunt     Social History:  reports that she has never smoked. She has never used smokeless tobacco. She reports that she does not drink alcohol or use drugs.  Allergies:  Allergies  Allergen Reactions  . Feraheme [Ferumoxytol] Other (See Comments)    Back pain (yelling out with back pain)  . Propranolol Swelling    Pt states it may have been leg swelling  . Topamax [Topiramate] Other (See Comments)    hallucinations  . Latex Itching and Rash    Medications: Prior to  Admission medications   Medication Sig Start Date End Date Taking? Authorizing Provider  acetaminophen (TYLENOL) 500 MG tablet Take 500-1,000 mg by mouth every 8 (eight) hours as needed for mild pain or headache.    Yes Provider, Historical, [name]  albuterol (PROVENTIL HFA;VENTOLIN HFA) 108 (90 Base) MCG/ACT inhaler Inhale 2 puffs into the lungs every 6 (six) hours as needed for wheezing or shortness of breath. Patient taking differently: Inhale 2 puffs into the lungs every 6 (six) hours as needed for wheezing or shortness of breath (As needed).  08/14/18  Yes Kathie Dike, [name]  apixaban (ELIQUIS) 5 MG TABS tablet Take 1 tablet (5 mg total) by mouth 2 (two) times daily. 07/21/18  Yes Herminio Commons, [name]  buPROPion (WELLBUTRIN XL) 300 MG 24 hr tablet Take 300 mg by mouth daily.  01/31/19  Yes  Provider, Historical, [name]  diltiazem (CARDIZEM CD) 120 MG 24 hr capsule Take 1 capsule (120 mg total) by mouth daily. Patient taking differently: Take 120 mg by mouth daily. Cartia XT 09/10/18  Yes Johnson, Clanford L, [name]  ertapenem (INVANZ) IVPB Inject 1 g into the vein daily. Indication:  Abdominal ESBL infection Last Day of Therapy: 21 days Labs - Once weekly:  CBC/D and BMP, Labs - Every other week:  ESR and CRP 04/30/19  Yes Shah, Pratik D, DO  escitalopram (LEXAPRO) 10 MG tablet Take 10 mg by mouth daily. 12/28/18  Yes Provider, Historical, [name]  ferrous sulfate (FERROUSUL) 325 (65 FE) MG tablet Take 1 tablet (325 mg total) by mouth 2 (two) times daily with a meal. 01/16/19  Yes Domenic Polite, [name]  fluticasone [facility]) 50 MCG/ACT nasal spray Place 2 sprays into both nostrils daily as needed for allergies or rhinitis (congestion).    Yes Provider, Historical, [name]  furosemide (LASIX) 20 MG tablet Take 2 tablets (40 mg total) by mouth 2 (two) times daily. 03/11/19 05/09/19 Yes Johnson, Clanford L, [name]  levalbuterol Penne Lash) 0.63 MG/3ML nebulizer solution Take 0.63 mg by nebulization every 6 (six) hours as needed  for wheezing or shortness of breath.  02/08/19  Yes Provider, Historical, [name]  LEVEMIR FLEXTOUCH 100 UNIT/ML Pen Inject 10 Units into the skin 2 (two) times a day. 01/05/19  Yes Provider, Historical, [name]  metoprolol tartrate (LOPRESSOR) 50 MG tablet Take 50 mg by mouth 2 (two) times daily.  02/12/19  Yes Provider, Historical, [name]  nystatin (MYCOSTATIN/NYSTOP) powder Apply topically daily as needed (rash in skin folds).   Yes Provider, Historical, [name]  ondansetron (ZOFRAN) 4 MG tablet Take 1 tablet (4 mg total) by mouth every 6 (six) hours as needed for nausea. 04/30/19  Yes Shah, Pratik D, DO  OVER THE COUNTER MEDICATION Apply 1 application topically daily as needed (rash in skin folds). Baby butt paste   Yes Provider, Historical, [name]  oxyCODONE (OXY IR/ROXICODONE) 5 MG immediate release tablet Take 1-2 tablets (5-10 mg total) by mouth every 6 (six) hours as needed for severe pain or breakthrough pain (wound vacuum changes). 04/27/19  Yes Virl Cagey, [name]  pantoprazole (PROTONIX) 40 MG tablet Take 1 tablet (40 mg total) by mouth daily before breakfast. Patient taking differently: Take 40 mg by mouth daily as needed (for GERD/acid reflux).  09/11/18  Yes Johnson, Clanford L, [name]  SPIRIVA RESPIMAT 1.25 MCG/ACT AERS Inhale 1 puff into the lungs daily.  05/01/19  Yes Provider, Historical, [name]    Scheduled Meds: . apixaban  5 mg Oral BID  . Chlorhexidine Gluconate Cloth  6 each Topical Daily  . diltiazem  120 mg Oral Daily  . feeding supplement (ENSURE ENLIVE)  237 mL Oral BID BM  . ferrous sulfate  325 mg Oral BID WC  . insulin aspart  0-7 Units Subcutaneous Q6H  . metoprolol tartrate  50 mg Oral BID  . mupirocin ointment   Nasal BID  . pantoprazole  40 mg Oral QAC breakfast  . sodium chloride flush  10-40 mL Intracatheter Q12H   Continuous Infusions: . sodium chloride 75 mL/hr at 05/17/19 1017  . ertapenem 1,000 mg (05/17/19 1536)   PRN Meds:.acetaminophen, fluticasone, levalbuterol, nystatin,  ondansetron **OR** ondansetron (ZOFRAN) IV, oxyCODONE, sodium chloride flush     Results for orders placed or performed during the hospital encounter of 05/09/19 (from the past 48 hour(s))  Glucose, capillary     Status: None  Collection Time: 05/15/19 11:41 PM  Result Value Ref Range   Glucose-Capillary 73 70 - 99 mg/dL   Comment 1 Notify RN    Comment 2 Document in Chart   Glucose, capillary     Status: None   Collection Time: 05/16/19  4:13 AM  Result Value Ref Range   Glucose-Capillary 94 70 - 99 mg/dL   Comment 1 Notify RN    Comment 2 Document in Chart   Glucose, capillary     Status: None   Collection Time: 05/16/19  5:36 AM  Result Value Ref Range   Glucose-Capillary 89 70 - 99 mg/dL  CBC     Status: Abnormal   Collection Time: 05/16/19  5:37 AM  Result Value Ref Range   WBC 9.9 4.0 - 10.5 K/uL   RBC 3.56 (L) 3.87 - 5.11 MIL/uL   Hemoglobin 9.0 (L) 12.0 - 15.0 g/dL   HCT 30.3 (L) 36.0 - 46.0 %   MCV 85.1 80.0 - 100.0 fL   MCH 25.3 (L) 26.0 - 34.0 pg   MCHC 29.7 (L) 30.0 - 36.0 g/dL   RDW 16.4 (H) 11.5 - 15.5 %   Platelets 371 150 - 400 K/uL   nRBC 0.0 0.0 - 0.2 %    Comment: Performed at [facility], [address], [place], [place] [address]  Basic metabolic panel     Status: Abnormal   Collection Time: 05/16/19  5:37 AM  Result Value Ref Range   Sodium 139 135 - 145 mmol/L   Potassium 3.3 (L) 3.5 - 5.1 mmol/L    Comment: DELTA CHECK NOTED   Chloride 95 (L) 98 - 111 mmol/L   CO2 31 22 - 32 mmol/L   Glucose, Bld 93 70 - 99 mg/dL   BUN 10 8 - 23 mg/dL   Creatinine, Ser 0.90 0.44 - 1.00 mg/dL   Calcium 8.1 (L) 8.9 - 10.3 mg/dL   GFR calc non Af Amer >60 >60 mL/min   GFR calc Af Amer >60 >60 mL/min   Anion gap 13 5 - 15    Comment: Performed at [facility], [address] [place], [place] [address]  Magnesium     Status: Abnormal   Collection Time: 05/16/19  5:37 AM  Result Value Ref Range   Magnesium 1.2 (L) 1.7 - 2.4 mg/dL    Comment: Performed at  [facility], [address] [place], [place] [address]  Glucose, capillary     Status: None   Collection Time: 05/16/19  7:38 AM  Result Value Ref Range   Glucose-Capillary 81 70 - 99 mg/dL   Comment 1 Notify RN   Blood gas, venous     Status: Abnormal   Collection Time: 05/16/19 11:35 AM  Result Value Ref Range   FIO2 21.00    pH, Ven 7.508 (H) 7.250 - 7.430   pCO2, Ven 46.8 44.0 - 60.0 mmHg   pO2, Ven <31.0 (LL) 32.0 - 45.0 mmHg    Comment: CRITICAL RESULT CALLED TO, READ BACK BY AND VERIFIED WITH: HOWERTON,M AT 11:50AM ON 05/16/19 BY FESTERMAN,C    Bicarbonate 35.2 (H) 20.0 - 28.0 mmol/L   Acid-Base Excess 12.8 (H) 0.0 - 2.0 mmol/L   O2 Saturation 45.5 %   Patient temperature 37.0     Comment: Performed at [facility], [address] [place], [place] [address]  Ammonia     Status: None   Collection Time: 05/16/19 11:35 AM  Result Value Ref Range   Ammonia  16 9 - 35 umol/L    Comment: Performed at [facility], [address] [place], [place] [address]  TSH     Status: None   Collection Time: 05/16/19 11:35 AM  Result Value Ref Range   TSH 1.071 0.350 - 4.500 uIU/mL    Comment: Performed by a 3rd Generation assay with a functional sensitivity of <=0.01 uIU/mL. Performed at [facility], [address], [place], [place] [address]   Glucose, capillary     Status: None   Collection Time: 05/16/19 11:38 AM  Result Value Ref Range   Glucose-Capillary 94 70 - 99 mg/dL   Comment 1 Notify RN   Glucose, capillary     Status: None   Collection Time: 05/16/19  4:14 PM  Result Value Ref Range   Glucose-Capillary 90 70 - 99 mg/dL   Comment 1 Notify RN   Glucose, capillary     Status: Abnormal   Collection Time: 05/17/19 12:00 AM  Result Value Ref Range   Glucose-Capillary 101 (H) 70 - 99 mg/dL  CBC     Status: Abnormal   Collection Time: 05/17/19  6:13 AM  Result Value Ref Range   WBC 10.8 (H) 4.0 - 10.5 K/uL   RBC 3.97 3.87 - 5.11 MIL/uL   Hemoglobin 10.0 (L) 12.0 - 15.0  g/dL   HCT 34.6 (L) 36.0 - 46.0 %   MCV 87.2 80.0 - 100.0 fL   MCH 25.2 (L) 26.0 - 34.0 pg   MCHC 28.9 (L) 30.0 - 36.0 g/dL   RDW 16.8 (H) 11.5 - 15.5 %   Platelets 421 (H) 150 - 400 K/uL   nRBC 0.0 0.0 - 0.2 %    Comment: Performed at [facility], [address] [place], [place] [address]  Basic metabolic panel     Status: Abnormal   Collection Time: 05/17/19  6:13 AM  Result Value Ref Range   Sodium 140 135 - 145 mmol/L   Potassium 3.4 (L) 3.5 - 5.1 mmol/L   Chloride 94 (L) 98 - 111 mmol/L   CO2 33 (H) 22 - 32 mmol/L   Glucose, Bld 111 (H) 70 - 99 mg/dL   BUN 9 8 - 23 mg/dL   Creatinine, Ser 1.03 (H) 0.44 - 1.00 mg/dL   Calcium 8.1 (L) 8.9 - 10.3 mg/dL   GFR calc non Af Amer 56 (L) >60 mL/min   GFR calc Af Amer >60 >60 mL/min   Anion gap 13 5 - 15    Comment: Performed at [facility], [address], [place], [place] [address]  Magnesium     Status: Abnormal   Collection Time: 05/17/19  6:13 AM  Result Value Ref Range   Magnesium 1.5 (L) 1.7 - 2.4 mg/dL    Comment: Performed at [facility], [address] [place], [place] [address]  Glucose, capillary     Status: Abnormal   Collection Time: 05/17/19  6:42 AM  Result Value Ref Range   Glucose-Capillary 114 (H) 70 - 99 mg/dL  Glucose, capillary     Status: Abnormal   Collection Time: 05/17/19 11:11 AM  Result Value Ref Range   Glucose-Capillary 110 (H) 70 - 99 mg/dL  Glucose, capillary     Status: Abnormal   Collection Time: 05/17/19  4:03 PM  Result Value Ref Range   Glucose-Capillary 134 (H) 70 - 99 mg/dL   Comment 1 Notify RN     Studies/Results:   EEG DESCRIPTION: EEG showed continuous generalized 2-5Hz  theta-delta  slowing admixed with 13-15Hz  diffuse beta activity, maximal frontocentral. No clear posterior dominant rhythm was seen. Hyperventilation and photic stimulation were not performed.   ABNORMALITY: 1. Continuous slow, generalized 2. Excessive beta, generalized  IMPRESSION: This study is  suggestive of moderate diffuse encephalopathy, non specific to etiology.  No seizures or epileptiform discharges were seen throughout the recording.     HEAD CT FINDINGS: Brain: Examination is significantly limited by motion artifact throughout. No evidence of acute infarction, hemorrhage, hydrocephalus, extra-axial collection or mass lesion/mass effect. Mild periventricular white matter hypodensity.  Vascular: No hyperdense vessel or unexpected calcification.  Skull: Normal. Negative for fracture or focal lesion.  Sinuses/Orbits: No acute finding.  Other: None.  IMPRESSION: Examination is significantly limited by motion artifact throughout. Within this limitation, no acute intracranial pathology. Small-vessel white matter disease.     This CT scan is reviewed.  There is mild global atrophy and also mild hypo density consistent with microvascular chronic changes.  Nothing acute is seen.   Dujuan Stankowski A. Merlene Torres, M.D.  Diplomate, Tax adviser of Psychiatry and Neurology ( Neurology). 05/17/2019, 6:33 PM

## 2019-05-17 NOTE — Progress Notes (Signed)
PROGRESS NOTE    [name]  PPJ:[id number] DOB: 1951/02/15 DOA: 05/09/2019 PCP: [name], MD   Brief Narrative:  Per HPI: [name] a68 y.o.female,with history of atrial fibrillation on anticoagulation therapy with apixaban, diabetes mellitus type 2, LV thrombus, hypertension, depression, abdominal wound infection,? Pyoderma gangrenosum, currently on IV Invanz. Patient was recently discharged from the hospital on 04/30/2019 when she was admitted with abdominal wound infection and was discharged on Invanz for 21 days. As per patient she also has been taking high-dose prednisone which was prescribed by general surgery on 03/09/2019 after she had debridement of the abdominal wound and biopsy came back? Pyoderma gangrenosum.  As per patient, she noticed that her blood sugar has been very high at home she takes 60 mg of prednisone daily. She was not discharged on prednisone on 04/30/2019 as per discharge summary.  She denies chest pain but complains of palpitations. Admits to having shortness of breath. Complains of nausea but denies vomiting and diarrhea. Denies abdominal pain or dysuria.  In the ED patient started on IV Cardizem infusion for atrial fibrillation with RVR.  9/22: Patient noted to have significant hypoglycemia. She is having very poor oral intake overall. Heart rate appears to be better controlled.  9/23: Hypoglycemia has improved as well as heart rate. She still is on minimal dose of IV Cardizem which will not be discontinued and hopefully, patient can be transferred to telemetry floor today. She is noted to have some mild delirium as well.  9/24: Patient continued to have episodes of hypoglycemia overnight and required D10 infusion temporarily. She continues to eat well and has less delirium today. Potassium and magnesium still a little bit on the low side.  9/25:Patient appears to be stabilizing from a blood glucose standpoint on  sliding scale, but will monitor 24 more hours to ensure stability prior to discharge. She also appears to have less delirium and potassium is improved.  9/26:Patient appears to be stable from a blood glucose standpoint and her heart rate is controlled. Unfortunately, she is having some persistent confusion and delirium. Her PICC line is also nonfunctioning at this point and will require an exchange prior to going back home.  9/27: Patient was given Seroquel overnight, but is still confused and disoriented. Heart rate and blood glucose is well controlled at this point. PICC line will require repositioning by [facility] today. We will plan to diurese further and increased dose of Seroquel tonight.  9/28: Patient has worsening confusion and persistent encephalopathy after being given full dose of Seroquel last night.  We will plan to hold this at the moment.  Will do other work-up and continue monitoring.  PT evaluation with possible need for SNF on discharge.  9/29: Patient continues have persistent encephalopathic state.  She is very poorly responsive and appears to have brief episodes of psychomotor agitation interrupted by somnolence.  We will continue current antibiotic treatment with Invanz to continue through 10/3.  Neurology evaluation pending.  EEG ordered as well as CT head for further evaluation.  Assessment & Plan:   Active Problems:   Atrial fibrillation with RVR (HCC)   Acute encephalopathy-unknown cause -Initially thought that this was related to delirium and patient was given Seroquel which has now been discontinued -Discontinue Wellbutrin and escitalopram today -CT head and EEG ordered -Appreciate neurology evaluation -Ammonia, TSH, and venous blood gas within normal limits -Continue to monitor closely -PT evaluation with need for SNF  Atrial fibrillation with RVR-resolved -Monitor on telemetry -Restart home oral  Cardizem as well today -Continue Eliquis for  anticoagulation -Continue metoprolol p.o. 50 mg twice daily  Mild hypokalemia -Potassium repletion will be ordered as well as magnesium -Recheck a.m. labs  Chronic abdominal wound with pyoderma gangrenosum -Continue IV Invanz through 10/3 -PICC lineadjusted 9/27 -Appreciate general surgery recommendations and will continue wound dressings and changes  Type 2 diabetes with labile blood glucose readings-resolved -Continue on SSI as needed with Accu-Cheks every6hours -Continue carb modified diet -Appreciate diabetes coordinator recommendations;A1c noted to be 8.8% -We will need endocrinology outpatient follow-up soon for insulin pump likely -Discussed case with diabetes coordinator and will plan to discharge on sliding scale insulin as prescribed with NovoLog pen in a.m. if she remains stable. This will be the safest plan for discharge and will hold further Levemir until seen by endocrinology in the near future.  Acute on chronic diastolic heart failure-improved -Hold further Lasix today as patient is not eating -Starting gentle, time-limited IV fluid  DVT prophylaxis:Apixaban Code Status:Full Family Communication:Plan to discuss further with husband today, last discussion 9/28 Disposition Plan:Heart rate and blood glucose have improved during this hospitalization and these were the initial complaints.  Unfortunately, she now has some persistent, acute encephalopathy that is being investigated with imaging and EEG as well as neurology consultation that are pending.   Consultants:  General surgery peripherally following  Neurology pending  Procedures:  EEG pending  CT head pending  Antimicrobials:  Anti-infectives (From admission, onward)   Start     Dose/Rate Route Frequency Ordered Stop   05/14/19 1700  ertapenem (INVANZ) 1,000 mg in sodium chloride 0.9 % 100 mL IVPB     1 g 200 mL/hr over 30 Minutes Intravenous Every 24 hours 05/14/19 1409      05/10/19 1300  ertapenem (INVANZ) 1,000 mg in sodium chloride 0.9 % 100 mL IVPB  Status:  Discontinued     1 g 200 mL/hr over 30 Minutes Intravenous Every 24 hours 05/09/19 2201 05/14/19 1406       Subjective: Patient seen and evaluated today with ongoing confusion and somnolence.  She did have some brief episodes of psychomotor agitation overnight and nursing staff states that she can be calmed down quite easily and will take her medications.  Objective: Vitals:   05/16/19 2113 05/17/19 0008 05/17/19 0640 05/17/19 0733  BP: (!) 104/56  (!) 136/124 100/80  Pulse: 67  73   Resp: 18  16   Temp: 97.9 F (36.6 C)  97.7 F (36.5 C)   TempSrc: Oral  Oral   SpO2: 94%  96%   Weight:  87 kg    Height:        Intake/Output Summary (Last 24 hours) at 05/17/2019 0950 Last data filed at 05/17/2019 0008 Gross per 24 hour  Intake 240 ml  Output 1350 ml  Net -1110 ml   Filed Weights   05/09/19 1708 05/11/19 0500 05/17/19 0008  Weight: 89.8 kg 95.1 kg 87 kg    Examination:  General exam: Appears calm and comfortable  Respiratory system: Clear to auscultation. Respiratory effort normal.  Currently on nasal cannula. Cardiovascular system: S1 & S2 heard, RRR. No JVD, murmurs, rubs, gallops or clicks. No pedal edema. Gastrointestinal system: Abdomen is nondistended, soft and nontender. No organomegaly or masses felt. Normal bowel sounds heard. Central nervous system: Confused with some psychomotor agitation and mixed somnolence. Extremities: Moving all 4 extremities equally. Skin: No rashes, lesions or ulcers Psychiatry: Cannot be assessed.    Data Reviewed: I have personally  reviewed following labs and imaging studies  CBC: Recent Labs  Lab 05/11/19 0648 05/16/19 0537 05/17/19 0613  WBC 5.9 9.9 10.8*  HGB 8.2* 9.0* 10.0*  HCT 29.4* 30.3* 34.6*  MCV 89.1 85.1 87.2  PLT 338 371 659*   Basic Metabolic Panel: Recent Labs  Lab 05/11/19 0648 05/12/19 0504 05/13/19 0549  05/14/19 0810 05/15/19 0608 05/16/19 0537 05/17/19 0613  NA 139 140 139 137 138 139 140  K 3.9 3.3* 5.0 4.5 4.2 3.3* 3.4*  CL 104 104 105 101 100 95* 94*  CO2 28 28 27 29 30 31  33*  GLUCOSE 279* 80 99 138* 70 93 111*  BUN 12 14 13 13 11 10 9   CREATININE 0.70 0.81 0.72 0.84 0.78 0.90 1.03*  CALCIUM 7.3* 7.8* 7.9* 8.3* 8.5* 8.1* 8.1*  MG 1.2* 1.5* 1.8  --   --  1.2* 1.5*   GFR: Estimated Creatinine Clearance: 55.8 mL/min (A) (by C-G formula based on SCr of 1.03 mg/dL (H)). Liver Function Tests: No results for input(s): AST, ALT, ALKPHOS, BILITOT, PROT, ALBUMIN in the last 168 hours. No results for input(s): LIPASE, AMYLASE in the last 168 hours. Recent Labs  Lab 05/16/19 1135  AMMONIA 16   Coagulation Profile: No results for input(s): INR, PROTIME in the last 168 hours. Cardiac Enzymes: No results for input(s): CKTOTAL, CKMB, CKMBINDEX, TROPONINI in the last 168 hours. BNP (last 3 results) No results for input(s): PROBNP in the last 8760 hours. HbA1C: No results for input(s): HGBA1C in the last 72 hours. CBG: Recent Labs  Lab 05/16/19 0738 05/16/19 1138 05/16/19 1614 05/17/19 0000 05/17/19 0642  GLUCAP 81 94 90 101* 114*   Lipid Profile: No results for input(s): CHOL, HDL, LDLCALC, TRIG, CHOLHDL, LDLDIRECT in the last 72 hours. Thyroid Function Tests: Recent Labs    05/16/19 1135  TSH 1.071   Anemia Panel: No results for input(s): VITAMINB12, FOLATE, FERRITIN, TIBC, IRON, RETICCTPCT in the last 72 hours. Sepsis Labs: No results for input(s): PROCALCITON, LATICACIDVEN in the last 168 hours.  Recent Results (from the past 240 hour(s))  SARS Coronavirus 2 [facility] order, Performed in [facility] hospital lab) Nasopharyngeal Nasopharyngeal Swab     Status: None   Collection Time: 05/09/19  5:37 PM   Specimen: Nasopharyngeal Swab  Result Value Ref Range Status   SARS Coronavirus 2 NEGATIVE NEGATIVE Final    Comment: (NOTE) If result is NEGATIVE SARS-CoV-2  target nucleic acids are NOT DETECTED. The SARS-CoV-2 RNA is generally detectable in upper and lower  respiratory specimens during the acute phase of infection. The lowest  concentration of SARS-CoV-2 viral copies this assay can detect is 250  copies / mL. A negative result does not preclude SARS-CoV-2 infection  and should not be used as the sole basis for treatment or other  patient management decisions.  A negative result may occur with  improper specimen collection / handling, submission of specimen other  than nasopharyngeal swab, presence of viral mutation(s) within the  areas targeted by this assay, and inadequate number of viral copies  (<250 copies / mL). A negative result must be combined with clinical  observations, patient history, and epidemiological information. If result is POSITIVE SARS-CoV-2 target nucleic acids are DETECTED. The SARS-CoV-2 RNA is generally detectable in upper and lower  respiratory specimens dur ing the acute phase of infection.  Positive  results are indicative of active infection with SARS-CoV-2.  Clinical  correlation with patient history and other diagnostic information is  necessary  to determine patient infection status.  Positive results do  not rule out bacterial infection or co-infection with other viruses. If result is PRESUMPTIVE POSTIVE SARS-CoV-2 nucleic acids MAY BE PRESENT.   A presumptive positive result was obtained on the submitted specimen  and confirmed on repeat testing.  While 2019 novel coronavirus  (SARS-CoV-2) nucleic acids may be present in the submitted sample  additional confirmatory testing may be necessary for epidemiological  and / or clinical management purposes  to differentiate between  SARS-CoV-2 and other Sarbecovirus currently known to infect humans.  If clinically indicated additional testing with an alternate test  methodology [phone]) is advised. The SARS-CoV-2 RNA is generally  detectable in upper and lower  respiratory sp ecimens during the acute  phase of infection. The expected result is Negative. Fact Sheet for Patients:  StrictlyIdeas.no Fact Sheet for Healthcare Providers: BankingDealers.co.za This test is not yet approved or cleared by the [place] FDA and has been authorized for detection and/or diagnosis of SARS-CoV-2 by FDA under an Emergency Use Authorization (EUA).  This EUA will remain in effect (meaning this test can be used) for the duration of the COVID-19 declaration under Section 564(b)(1) of the Act, 21 U.S.C. section 360bbb-3(b)(1), unless the authorization is terminated or revoked sooner. Performed at [facility], [address], [place], [place] [address]   MRSA PCR Screening     Status: Abnormal   Collection Time: 05/09/19 10:04 PM   Specimen: Nasal Mucosa; Nasopharyngeal  Result Value Ref Range Status   MRSA by PCR POSITIVE (A) NEGATIVE Final    Comment:        The GeneXpert MRSA Assay (FDA approved for NASAL specimens only), is one component of a comprehensive MRSA colonization surveillance program. It is not intended to diagnose MRSA infection nor to guide or monitor treatment for MRSA infections. RESULT CALLED TO, READ BACK BY AND VERIFIED WITH: Nicholes Rough @0529  05/10/19 [facility] Performed at [facility], [address], [place], [place] [address]          Radiology Studies: Dg Chest [place] 1 View  Addendum Date: 05/15/2019   ADDENDUM REPORT: 05/15/2019 11:10 ADDENDUM: The technologist reversed the image and incorrectly labeled the laterality on the image obtained at 1025 hours. A repeat x-ray was obtained at 1051 hours and appropriately labeled. With this new information, it is clear that the patient has a left PICC line, not a right PICC line. The left PICC line tip overlies the right cardiomediastinal silhouette, superimposed on the region of the distal SVC near the junction with the RA.  Cardiopericardial silhouette is stable in size. Pulmonary edema pattern again noted. Bones are diffusely demineralized with chronic posttraumatic deformity in the proximal left humerus. IMPRESSION: Left PICC line tip is superimposed on the expected location of the distal SVC. Electronically Signed   By: Misty Stanley M.D.   On: 05/15/2019 11:10   Result Date: 05/15/2019 CLINICAL DATA:  PICC line placement EXAM: PORTABLE CHEST 1 VIEW COMPARISON:  05/14/2019 FINDINGS: 1025 hours. Patient is markedly rotated to the left. The left PICC line seen previously is no longer evident and a right PICC line is now visible. The tip of the right PICC line projects over the left cardiomediastinal shadow on this exam. Given the marked rotation, tip position cannot be localized. Cardiopericardial silhouette is enlarged. Similar pulmonary edema pattern. No substantial pleural effusion. IMPRESSION: Right PICC line is new in the interval. Tip position cannot be adequately assessed given the marked leftward rotation. The tip  of the PICC line projects too far to the left but given the presumed anterior positioning of the line, this may simply be related to the rotation. Repeat chest x-ray recommended with careful attention to AP positioning to better assess PICC line location. Electronically Signed: By: Misty Stanley M.D. On: 05/15/2019 10:49        Scheduled Meds: . apixaban  5 mg Oral BID  . Chlorhexidine Gluconate Cloth  6 each Topical Daily  . diltiazem  120 mg Oral Daily  . feeding supplement (ENSURE ENLIVE)  237 mL Oral BID BM  . ferrous sulfate  325 mg Oral BID WC  . insulin aspart  0-7 Units Subcutaneous Q6H  . metoprolol tartrate  50 mg Oral BID  . mupirocin ointment   Nasal BID  . pantoprazole  40 mg Oral QAC breakfast  . potassium chloride  40 mEq Oral Once  . sodium chloride flush  10-40 mL Intracatheter Q12H   Continuous Infusions: . sodium chloride    . ertapenem 1,000 mg (05/16/19 1709)  . magnesium  sulfate bolus IVPB       LOS: 8 days    Time spent: 30 minutes    Paeton Latouche Darleen Crocker, DO [facility] Pager [phone]  If 7PM-7AM, please contact night-coverage [email or web address] Password [email or web address] 05/17/2019, 9:50 AM

## 2019-05-17 NOTE — Progress Notes (Signed)
EEG Completed; Results Pending  

## 2019-05-18 DIAGNOSIS — E1165 Type 2 diabetes mellitus with hyperglycemia: Secondary | ICD-10-CM

## 2019-05-18 DIAGNOSIS — E876 Hypokalemia: Secondary | ICD-10-CM

## 2019-05-18 DIAGNOSIS — R5381 Other malaise: Secondary | ICD-10-CM

## 2019-05-18 DIAGNOSIS — Z794 Long term (current) use of insulin: Secondary | ICD-10-CM

## 2019-05-18 LAB — CBC
HCT: 32.8 % — ABNORMAL LOW (ref 36.0–46.0)
Hemoglobin: 9.5 g/dL — ABNORMAL LOW (ref 12.0–15.0)
MCH: 25.5 pg — ABNORMAL LOW (ref 26.0–34.0)
MCHC: 29 g/dL — ABNORMAL LOW (ref 30.0–36.0)
MCV: 87.9 fL (ref 80.0–100.0)
Platelets: 402 10*3/uL — ABNORMAL HIGH (ref 150–400)
RBC: 3.73 MIL/uL — ABNORMAL LOW (ref 3.87–5.11)
RDW: 17.1 % — ABNORMAL HIGH (ref 11.5–15.5)
WBC: 9.5 10*3/uL (ref 4.0–10.5)
nRBC: 0 % (ref 0.0–0.2)

## 2019-05-18 LAB — BASIC METABOLIC PANEL
Anion gap: 12 (ref 5–15)
BUN: 8 mg/dL (ref 8–23)
CO2: 29 mmol/L (ref 22–32)
Calcium: 7.6 mg/dL — ABNORMAL LOW (ref 8.9–10.3)
Chloride: 97 mmol/L — ABNORMAL LOW (ref 98–111)
Creatinine, Ser: 0.91 mg/dL (ref 0.44–1.00)
GFR calc Af Amer: >60 mL/min (ref >60)
GFR calc non Af Amer: >60 mL/min (ref >60)
Glucose, Bld: 79 mg/dL (ref 70–99)
Potassium: 3 mmol/L — ABNORMAL LOW (ref 3.5–5.1)
Sodium: 138 mmol/L (ref 135–145)

## 2019-05-18 LAB — MAGNESIUM: Magnesium: 1.8 mg/dL (ref 1.7–2.4)

## 2019-05-18 LAB — GLUCOSE, CAPILLARY
Glucose-Capillary: 85 mg/dL (ref 70–99)
Glucose-Capillary: 97 mg/dL (ref 70–99)
Glucose-Capillary: 84 mg/dL (ref 70–99)

## 2019-05-18 MED ORDER — POTASSIUM CHLORIDE CRYS ER 20 MEQ PO TBCR
40.0000 meq | EXTENDED_RELEASE_TABLET | Freq: Every day | ORAL | Status: DC
  Administered 2019-05-18 – 2019-05-25 (×8): 40 meq via ORAL
  Filled 2019-05-18 (×8): qty 2

## 2019-05-18 NOTE — Progress Notes (Signed)
PROGRESS NOTE    [name]  OVF:[id number] DOB: 07-Jul-1951 DOA: 05/09/2019 PCP: [name], MD   Brief Narrative:  Per HPI: [name] a68 y.o.female,with history of atrial fibrillation on anticoagulation therapy with apixaban, diabetes mellitus type 2, LV thrombus, hypertension, depression, abdominal wound infection,? Pyoderma gangrenosum, currently on IV Invanz. Patient was recently discharged from the hospital on 04/30/2019 when she was admitted with abdominal wound infection and was discharged on Invanz for 21 days. As per patient she also has been taking high-dose prednisone which was prescribed by general surgery on 03/09/2019 after she had debridement of the abdominal wound and biopsy came back? Pyoderma gangrenosum.  As per patient, she noticed that her blood sugar has been very high at home she takes 60 mg of prednisone daily. She was not discharged on prednisone on 04/30/2019 as per discharge summary.  She denies chest pain but complains of palpitations. Admits to having shortness of breath. Complains of nausea but denies vomiting and diarrhea. Denies abdominal pain or dysuria.  In the ED patient started on IV Cardizem infusion for atrial fibrillation with RVR.  9/22: Patient noted to have significant hypoglycemia. She is having very poor oral intake overall. Heart rate appears to be better controlled.  9/23: Hypoglycemia has improved as well as heart rate. She still is on minimal dose of IV Cardizem which will not be discontinued and hopefully, patient can be transferred to telemetry floor today. She is noted to have some mild delirium as well.  9/24: Patient continued to have episodes of hypoglycemia overnight and required D10 infusion temporarily. She continues to eat well and has less delirium today. Potassium and magnesium still a little bit on the low side.  9/25:Patient appears to be stabilizing from a blood glucose standpoint on  sliding scale, but will monitor 24 more hours to ensure stability prior to discharge. She also appears to have less delirium and potassium is improved.  9/26:Patient appears to be stable from a blood glucose standpoint and her heart rate is controlled. Unfortunately, she is having some persistent confusion and delirium. Her PICC line is also nonfunctioning at this point and will require an exchange prior to going back home.  9/27: Patient was given Seroquel overnight, but is still confused and disoriented. Heart rate and blood glucose is well controlled at this point. PICC line will require repositioning by [facility] today. We will plan to diurese further and increased dose of Seroquel tonight.  9/28: Patient has worsening confusion and persistent encephalopathy after being given full dose of Seroquel last night.  We will plan to hold this at the moment.  Will do other work-up and continue monitoring.  PT evaluation with possible need for SNF on discharge.  9/29: Patient continues have persistent encephalopathic state.  She is very poorly responsive and appears to have brief episodes of psychomotor agitation interrupted by somnolence.  We will continue current antibiotic treatment with Invanz to continue through 10/3.  Neurology evaluation pending.  EEG ordered as well as CT head for further evaluation.  Assessment & Plan:   Active Problems:   Atrial fibrillation with RVR (HCC)   Acute encephalopathy-unknown cause -Initially thought that this was related to hospital acquired delirium and patient was given Seroquel, given lack of improvement medication has been discontinued. -Continue holding Wellbutrin and escitalopram. -CT head without acute intracranial abnormalities and EEG no demonstrating acute epileptic waveforms. -Appreciate neurology evaluation -Ammonia, TSH, and venous blood gas within normal limits -Continue to monitor closely  Atrial fibrillation with  RVR-resolved  -Continue telemetry monitoring -Continue oral Cardizem and metoprolol -Follow-up vital signs and heart rate with further adjustment to medication as needed -Continue Eliquis for secondary prevention.  Generalized weakness -Patient evaluated by PT and found severely deconditioned with recommendation for skilled nursing facility at time of discharge for rehabilitation -Social worker has been made aware for assistance with placement.  Mild hypokalemia -Continue to follow electrolytes trend and further replete as needed.  Chronic abdominal wound with pyoderma gangrenosum -Continue IV Invanz through 10/3 -PICC lineadjusted 9/27 -Appreciate general surgery recommendations and will continue wound dressings and changes -no steroids therapy currently -will need outpatient follow up with dermatology service  Type 2 diabetes with labile blood glucose readings-resolved -Continue on SSI as needed with Accu-Cheks everyQAC-QHS -Continue carb modified diet -Appreciate diabetes coordinator recommendations;A1c noted to be 8.8% -Will need endocrinology outpatient follow-up soon for insulin pump if she is a candidate for it.  Acute on chronic diastolic heart failure -improved -will resume PO lasix on 10/1 -encourage oral intake and proper hydration. -Follow daily weights. -Volume status compensated at this moment.  DVT prophylaxis:Apixaban Code Status:Full Family Communication:no family at bedside.  Disposition Plan:Heart rate and blood glucose have improved during this hospitalization and these were the initial complaints.  Unfortunately, she now has some persistent, acute encephalopathy that is being investigated. Appreciate neurology service assistance and recommendations.  Physical therapy has recommended skilled nursing facility for rehabilitation.   Consultants:  General surgery peripherally following  Neurology   Procedures:  EEG: Negative for acute epileptic  waveforms; positive for encephalopathy changes.  CT head: No acute intracranial abnormalities.  Antimicrobials:  Anti-infectives (From admission, onward)   Start     Dose/Rate Route Frequency Ordered Stop   05/14/19 1700  ertapenem (INVANZ) 1,000 mg in sodium chloride 0.9 % 100 mL IVPB     1 g 200 mL/hr over 30 Minutes Intravenous Every 24 hours 05/14/19 1409     05/10/19 1300  ertapenem (INVANZ) 1,000 mg in sodium chloride 0.9 % 100 mL IVPB  Status:  Discontinued     1 g 200 mL/hr over 30 Minutes Intravenous Every 24 hours 05/09/19 2201 05/14/19 1406      Subjective: No fever, no chest pain, no shortness of breath.  Patient is calm her today and oriented x2.  Still with demonstration of poor insight and experiencing intermittent confusion.  Objective: Vitals:   05/17/19 0733 05/17/19 1700 05/17/19 2113 05/18/19 0558  BP: 100/80 98/68 (!) 126/51 (!) 108/58  Pulse:  80 79 61  Resp:  20 18 16   Temp:   98.5 F (36.9 C) 98.1 F (36.7 C)  TempSrc:   Oral Oral  SpO2:  100% 97% 92%  Weight:      Height:        Intake/Output Summary (Last 24 hours) at 05/18/2019 1534 Last data filed at 05/18/2019 0900 Gross per 24 hour  Intake 1440.25 ml  Output -  Net 1440.25 ml   Filed Weights   05/09/19 1708 05/11/19 0500 05/17/19 0008  Weight: 89.8 kg 95.1 kg 87 kg    Examination: General exam: Alert, awake, oriented to person and place; demonstrating poor insight still.  No fever, no chest pain, no nausea, no vomiting. Respiratory system: Clear to auscultation. Respiratory effort normal. Cardiovascular system: Irregular. No murmurs, rubs, gallops. Gastrointestinal system: Abdomen is nondistended, soft and nontender. No organomegaly or masses felt. Normal bowel sounds heard. Central nervous system: Alert and oriented. No focal neurological deficits. Extremities: No C/C/E, +pedal pulses  Skin: No rashes, erythematous changes or active drainage on her abdominal wound. Psychiatry:  Judgement and insight appear impaired given current encephalopathic processnormal. Mood & affect appropriate.    Data Reviewed: I have personally reviewed following labs and imaging studies  CBC: Recent Labs  Lab 05/16/19 0537 05/17/19 0613 05/18/19 0451  WBC 9.9 10.8* 9.5  HGB 9.0* 10.0* 9.5*  HCT 30.3* 34.6* 32.8*  MCV 85.1 87.2 87.9  PLT 371 421* 413*   Basic Metabolic Panel: Recent Labs  Lab 05/12/19 0504 05/13/19 0549 05/14/19 0810 05/15/19 0608 05/16/19 0537 05/17/19 0613 05/18/19 0451  NA 140 139 137 138 139 140 138  K 3.3* 5.0 4.5 4.2 3.3* 3.4* 3.0*  CL 104 105 101 100 95* 94* 97*  CO2 28 27 29 30 31  33* 29  GLUCOSE 80 99 138* 70 93 111* 79  BUN 14 13 13 11 10 9 8   CREATININE 0.81 0.72 0.84 0.78 0.90 1.03* 0.91  CALCIUM 7.8* 7.9* 8.3* 8.5* 8.1* 8.1* 7.6*  MG 1.5* 1.8  --   --  1.2* 1.5* 1.8   GFR: Estimated Creatinine Clearance: 63.1 mL/min (by C-G formula based on SCr of 0.91 mg/dL).   Recent Labs  Lab 05/16/19 1135  AMMONIA 16   CBG: Recent Labs  Lab 05/17/19 1111 05/17/19 1603 05/17/19 2344 05/18/19 0601 05/18/19 1121  GLUCAP 110* 134* 98 85 97   Thyroid Function Tests: Recent Labs    05/16/19 1135  TSH 1.071    Recent Results (from the past 240 hour(s))  SARS Coronavirus 2 [facility] order, Performed in [facility] hospital lab) Nasopharyngeal Nasopharyngeal Swab     Status: None   Collection Time: 05/09/19  5:37 PM   Specimen: Nasopharyngeal Swab  Result Value Ref Range Status   SARS Coronavirus 2 NEGATIVE NEGATIVE Final    Comment: (NOTE) If result is NEGATIVE SARS-CoV-2 target nucleic acids are NOT DETECTED. The SARS-CoV-2 RNA is generally detectable in upper and lower  respiratory specimens during the acute phase of infection. The lowest  concentration of SARS-CoV-2 viral copies this assay can detect is 250  copies / mL. A negative result does not preclude SARS-CoV-2 infection  and should not be used as the sole basis for  treatment or other  patient management decisions.  A negative result may occur with  improper specimen collection / handling, submission of specimen other  than nasopharyngeal swab, presence of viral mutation(s) within the  areas targeted by this assay, and inadequate number of viral copies  (<250 copies / mL). A negative result must be combined with clinical  observations, patient history, and epidemiological information. If result is POSITIVE SARS-CoV-2 target nucleic acids are DETECTED. The SARS-CoV-2 RNA is generally detectable in upper and lower  respiratory specimens dur ing the acute phase of infection.  Positive  results are indicative of active infection with SARS-CoV-2.  Clinical  correlation with patient history and other diagnostic information is  necessary to determine patient infection status.  Positive results do  not rule out bacterial infection or co-infection with other viruses. If result is PRESUMPTIVE POSTIVE SARS-CoV-2 nucleic acids MAY BE PRESENT.   A presumptive positive result was obtained on the submitted specimen  and confirmed on repeat testing.  While 2019 novel coronavirus  (SARS-CoV-2) nucleic acids may be present in the submitted sample  additional confirmatory testing may be necessary for epidemiological  and / or clinical management purposes  to differentiate between  SARS-CoV-2 and other Sarbecovirus currently known to infect humans.  If clinically indicated additional testing with an alternate test  methodology [phone]) is advised. The SARS-CoV-2 RNA is generally  detectable in upper and lower respiratory sp ecimens during the acute  phase of infection. The expected result is Negative. Fact Sheet for Patients:  StrictlyIdeas.no Fact Sheet for Healthcare Providers: BankingDealers.co.za This test is not yet approved or cleared by the [place] FDA and has been authorized for detection and/or  diagnosis of SARS-CoV-2 by FDA under an Emergency Use Authorization (EUA).  This EUA will remain in effect (meaning this test can be used) for the duration of the COVID-19 declaration under Section 564(b)(1) of the Act, 21 U.S.C. section 360bbb-3(b)(1), unless the authorization is terminated or revoked sooner. Performed at [facility], [address] [place], [place] [address]   MRSA PCR Screening     Status: Abnormal   Collection Time: 05/09/19 10:04 PM   Specimen: Nasal Mucosa; Nasopharyngeal  Result Value Ref Range Status   MRSA by PCR POSITIVE (A) NEGATIVE Final    Comment:        The GeneXpert MRSA Assay (FDA approved for NASAL specimens only), is one component of a comprehensive MRSA colonization surveillance program. It is not intended to diagnose MRSA infection nor to guide or monitor treatment for MRSA infections. RESULT CALLED TO, READ BACK BY AND VERIFIED WITH: Nicholes Rough @0529  05/10/19 [facility] Performed at [facility], [address] [place], [place] [address]      Radiology Studies: Ct Head Wo Contrast  Result Date: 05/17/2019 CLINICAL DATA:  Altered mental status EXAM: CT HEAD WITHOUT CONTRAST TECHNIQUE: Contiguous axial images were obtained from the base of the skull through the vertex without intravenous contrast. COMPARISON:  None. FINDINGS: Brain: Examination is significantly limited by motion artifact throughout. No evidence of acute infarction, hemorrhage, hydrocephalus, extra-axial collection or mass lesion/mass effect. Mild periventricular white matter hypodensity. Vascular: No hyperdense vessel or unexpected calcification. Skull: Normal. Negative for fracture or focal lesion. Sinuses/Orbits: No acute finding. Other: None. IMPRESSION: Examination is significantly limited by motion artifact throughout. Within this limitation, no acute intracranial pathology. Small-vessel white matter disease. Electronically Signed   By: Eddie Candle M.D.   On: 05/17/2019 11:48     Scheduled Meds: . apixaban  5 mg Oral BID  . Chlorhexidine Gluconate Cloth  6 each Topical Daily  . diltiazem  120 mg Oral Daily  . feeding supplement (ENSURE ENLIVE)  237 mL Oral BID BM  . ferrous sulfate  325 mg Oral BID WC  . insulin aspart  0-7 Units Subcutaneous Q6H  . metoprolol tartrate  50 mg Oral BID  . mupirocin ointment   Nasal BID  . pantoprazole  40 mg Oral QAC breakfast  . potassium chloride  40 mEq Oral Daily  . sodium chloride flush  10-40 mL Intracatheter Q12H   Continuous Infusions: . ertapenem 1,000 mg (05/17/19 1536)     LOS: 9 days    Time spent: 30 minutes    Barton Dubois, MD [facility] Pager [phone]  05/18/2019, 3:34 PM

## 2019-05-18 NOTE — Progress Notes (Signed)
[facility]  Checking on wound today. Patient seems a little confused this Am but oriented to self, place and knows me, cannot tell me why she is still in hopsital. Notes report persistent encephalopathy and EEG/ CT head ordered and CT was negative for acute process but limited due to motion. EEG without seizure activity just diffuse moderate encephalopathy of unknown origin.    Wound is looking clean and shrinking in size.     Would continue BID saline dampened gauze and ABD application inpatient and outpatient.   I have follow up with the patient 10/13 if she is discharged prior to then to look at the wound. We can keep this at this time.  Future Appointments  Date Time Provider [place]  05/31/2019 10:30 AM Virl Cagey, MD [facility] None  08/04/2019 11:00 AM Herminio Commons, MD [facility], MD [facility] [address] [place], [place] [address] [phone] (office)

## 2019-05-18 NOTE — Progress Notes (Signed)
Physical Therapy Treatment Patient Details Name: [name] MRN: [id number] DOB: May 17, 1951 Today's Date: 05/18/2019    History of Present Illness [name]  is a 68 y.o. female, with history of atrial fibrillation on anticoagulation therapy with apixaban, diabetes mellitus type 2, LV thrombus, hypertension, depression, abdominal wound infection,?  Pyoderma gangrenosum, currently on IV Invanz.  Patient was recently discharged from the hospital on 04/30/2019 when she was admitted with abdominal wound infection and was discharged on Invanz for 21 days.  As per patient she also has been taking high-dose prednisone which was prescribed by general surgery on 03/09/2019 after she had debridement of the abdominal wound and biopsy came back?  Pyoderma gangrenosum.    PT Comments    Pt was seen for mobility with bed mob completed with max assist, and with max to remain sitting up due to her resistance to sitting.  Pt is able to control balance, can reach forward with control but cannot stay there as she is distracted and leans back suddenly after a forward reach.  Will continue to work on her balance and control of movement and will focus on ROM as tolerated, but will need to do supine as sitting balance is too challenged with ex.     Follow Up Recommendations  SNF     Equipment Recommendations  None recommended by PT    Recommendations for Other Services       Precautions / Restrictions Precautions Precautions: Fall Precaution Comments: monitor for impulsive behavior Restrictions Weight Bearing Restrictions: No    Mobility  Bed Mobility Overal bed mobility: Needs Assistance Bed Mobility: Supine to Sit;Sit to Supine     Supine to sit: Mod assist Sit to supine: Mod assist   General bed mobility comments: using bedrail and follows PT cues poorly  Transfers Overall transfer level: Needs assistance               General transfer comment: declined to stand up but is also  leaning backward on the bed with resistance to prompts for upright sitting  Ambulation/Gait             General Gait Details: unable to try   Stairs             Wheelchair Mobility    Modified Rankin (Stroke Patients Only)       Balance Overall balance assessment: Needs assistance Sitting-balance support: Feet supported;Bilateral upper extremity supported Sitting balance-Leahy Scale: Poor Sitting balance - Comments: fair/poor with frequent falling backwards                                    Cognition Arousal/Alertness: Awake/alert Behavior During Therapy: Agitated;Impulsive Overall Cognitive Status: No family/caregiver present to determine baseline cognitive functioning                                 General Comments: has some limited speech as she is whispering      Exercises General Exercises - Lower Extremity Ankle Circles/Pumps: AAROM;Both;5 reps    General Comments General comments (skin integrity, edema, etc.): pt was assisted to sit on side of bed with forward reaching to feet to prompt uprigh sitting, and was able to control sit balance if she was reaching for her feet      Pertinent Vitals/Pain Pain Assessment: No/denies pain Pain Intervention(s): (pt was able to  move and no pain noted)    Home Living                      Prior Function            PT Goals (current goals can now be found in the care plan section) Acute Rehab PT Goals Patient Stated Goal: none stated Progress towards PT goals: Progressing toward goals    Frequency    Min 3X/week      PT Plan Current plan remains appropriate    Co-evaluation              AM-PAC PT "6 Clicks" Mobility   Outcome Measure  Help needed turning from your back to your side while in a flat bed without using bedrails?: A Lot Help needed moving from lying on your back to sitting on the side of a flat bed without using bedrails?: A Lot Help  needed moving to and from a bed to a chair (including a wheelchair)?: Total Help needed standing up from a chair using your arms (e.g., wheelchair or bedside chair)?: Total Help needed to walk in hospital room?: Total Help needed climbing 3-5 steps with a railing? : Total 6 Click Score: 8    End of Session   Activity Tolerance: Patient limited by fatigue;Other (comment)(confusion) Patient left: in bed;with call bell/phone within reach;with bed alarm set Nurse Communication: Mobility status PT Visit Diagnosis: Unsteadiness on feet (R26.81);Other abnormalities of gait and mobility (R26.89);Muscle weakness (generalized) (M62.81)     Time: 1779-3903 PT Time Calculation (min) (ACUTE ONLY): 19 min  Charges:  $Therapeutic Activity: 8-22 mins                   Ramond Dial 05/18/2019, 12:13 PM   Mee Hives, PT MS Acute Rehab Dept. Number: [place] and [place]

## 2019-05-19 LAB — GLUCOSE, CAPILLARY
Glucose-Capillary: 68 mg/dL — ABNORMAL LOW (ref 70–99)
Glucose-Capillary: 70 mg/dL (ref 70–99)
Glucose-Capillary: 83 mg/dL (ref 70–99)
Glucose-Capillary: 86 mg/dL (ref 70–99)

## 2019-05-19 LAB — BASIC METABOLIC PANEL
Anion gap: 11 (ref 5–15)
BUN: 8 mg/dL (ref 8–23)
CO2: 27 mmol/L (ref 22–32)
Calcium: 7.9 mg/dL — ABNORMAL LOW (ref 8.9–10.3)
Chloride: 102 mmol/L (ref 98–111)
Creatinine, Ser: 0.85 mg/dL (ref 0.44–1.00)
GFR calc Af Amer: >60 mL/min (ref >60)
GFR calc non Af Amer: >60 mL/min (ref >60)
Glucose, Bld: 79 mg/dL (ref 70–99)
Potassium: 3.7 mmol/L (ref 3.5–5.1)
Sodium: 140 mmol/L (ref 135–145)

## 2019-05-19 LAB — MAGNESIUM: Magnesium: 1.7 mg/dL (ref 1.7–2.4)

## 2019-05-19 NOTE — NC FL2 (Signed)
[place] LEVEL OF CARE SCREENING TOOL     IDENTIFICATION  Patient Name: [name] Birthdate: [birth year] Sex: female Admission Date (Current Location): 05/09/2019  [facility] and [place] Number:  [employer] and Address:  [place] [address] [place]      Provider Number: [id number]  Attending Physician Name and Address:  Barton Dubois, MD  Relative Name and Phone Number:       Current Level of Care: Hospital Recommended Level of Care: [place] Prior Approval Number:    Date Approved/Denied:   PASRR Number: [id number]  Discharge Plan: SNF    Current Diagnoses: Patient Active Problem List   Diagnosis Date Noted  . Atrial fibrillation with tachycardic ventricular rate ([place]) 04/28/2019  . Diabetes ([place]) 04/22/2019  . Pyoderma gangrenosum 03/11/2019  . Wound infection 03/09/2019  . Chronic wound infection of abdomen   . Wheelchair bound   . Abdominal wall skin ulcer ([place]) 03/07/2019  . Acute pulmonary edema ([place]) 02/17/2019  . Iron deficiency anemia 02/01/2019  . Abdominal wall ulcer, with fat layer exposed ([place]) 01/16/2019  . Elevated troponin   . Nausea and vomiting   . Symptomatic bradycardia 01/10/2019  . DKA (diabetic ketoacidoses) ([place]) 01/10/2019  . Urinary incontinence 12/28/2018  . Recurrent urinary tract infection 12/16/2018  . Wound, open, anterior abdominal wall 12/05/2018  . Hypotension due to hypovolemia   . Gastritis due to nonsteroidal anti-inflammatory drug   . Esophageal dysphagia   . GI bleed 09/03/2018  . Coagulopathy ([place])   . Colitis   . Lower abdominal pain   . Rectal bleeding   . Dehydration 08/16/2018  . Acute on chronic diastolic CHF (congestive heart failure) ([place]) 08/10/2018  . Acute lower UTI 08/10/2018  . Hypokalemia 08/10/2018  . COPD (chronic obstructive pulmonary disease) ([place]) 08/10/2018  . Thyroid lesion 08/10/2018  . Closed fracture of right ankle  06/10/2018  . Closed fracture of left tibia and fibula with routine healing 04/13/18 06/10/2018  . Atrial fibrillation ([place]) 04/15/2018  . Chronic diastolic HF (heart failure) ([place]) 04/15/2018  . CKD (chronic kidney disease), stage III 04/15/2018  . Closed right fibular fracture 04/15/2018  . Left tibial fracture 04/14/2018  . CHF exacerbation ([place]) 04/10/2018  . SOB (shortness of breath)   . Acute CHF (congestive heart failure) ([place]) 04/01/2018  . Atrial fibrillation with RVR ([place]) 04/01/2018  . [place] 03/11/2016  . Muscle spasms of lower extremity 03/04/2016  . Acute renal insufficiency 02/26/2016  . History of MRSA infection 02/26/2016  . HTN (hypertension) 02/19/2016  . Fracture, femur, distal ([name]) 02/19/2016  . Fall 02/19/2016  . Depression 02/19/2016  . Closed left femoral fracture, initial encounter 02/19/2016  . Femur fracture, left ([place]) 02/19/2016    Orientation RESPIRATION BLADDER Height & Weight     Self, Situation  Normal Incontinent Weight: 191 lb 12.8 oz (87 kg) Height:  5' 4"  (162.6 cm)  BEHAVIORAL SYMPTOMS/MOOD NEUROLOGICAL BOWEL NUTRITION STATUS      Incontinent Diet(heart healthy/carb modified)  AMBULATORY STATUS COMMUNICATION OF NEEDS Skin   Extensive Assist Verbally Surgical wounds(lower abdomen)                       Personal Care Assistance Level of Assistance  Bathing, Feeding, Dressing Bathing Assistance: Limited assistance Feeding assistance: Independent Dressing Assistance: Limited assistance     Functional Limitations Info  Sight, Hearing, Speech Sight Info: Adequate Hearing Info: Adequate Speech Info: Adequate  SPECIAL CARE FACTORS FREQUENCY  PT (By licensed PT)     PT Frequency: 5x/week              Contractures Contractures Info: Not present    Additional Factors Info  Code Status, Allergies, Isolation Precautions, Psychotropic Code Status Info: Full Code Allergies Info: Feraheme, propranolol, topamax,  latex Psychotropic Info: Wellbutrin, Lexapro   Isolation Precautions Info: ESBL, MRSA,     Current Medications (05/19/2019):  This is the current hospital active medication list Current Facility-Administered Medications  Medication Dose Route Frequency Provider Last Rate Last Dose  . acetaminophen (TYLENOL) tablet 500-1,000 mg  500-1,000 mg Oral Q8H PRN Oswald Hillock, MD      . apixaban (ELIQUIS) tablet 5 mg  5 mg Oral BID Oswald Hillock, MD   5 mg at 05/18/19 2249  . Chlorhexidine Gluconate Cloth 2 % PADS 6 each  6 each Topical Daily Oswald Hillock, MD   6 each at 05/18/19 1605  . diltiazem (CARDIZEM CD) 24 hr capsule 120 mg  120 mg Oral Daily Manuella Ghazi, Pratik D, DO   120 mg at 05/18/19 0814  . ertapenem (INVANZ) 1,000 mg in sodium chloride 0.9 % 100 mL IVPB  1 g Intravenous Q24H Manuella Ghazi, Pratik D, DO 200 mL/hr at 05/18/19 1619 1,000 mg at 05/18/19 1619  . feeding supplement (ENSURE ENLIVE) (ENSURE ENLIVE) liquid 237 mL  237 mL Oral BID BM Oswald Hillock, MD   237 mL at 05/18/19 1622  . ferrous sulfate tablet 325 mg  325 mg Oral BID WC Oswald Hillock, MD   325 mg at 05/18/19 0815  . fluticasone (FLONASE) 50 MCG/ACT nasal spray 2 spray  2 spray Each Nare Daily PRN Oswald Hillock, MD      . insulin aspart (novoLOG) injection 0-7 Units  0-7 Units Subcutaneous Q6H Shah, Pratik D, DO   1 Units at 05/14/19 1312  . levalbuterol (XOPENEX) nebulizer solution 0.63 mg  0.63 mg Nebulization Q6H PRN Oswald Hillock, MD      . metoprolol tartrate (LOPRESSOR) tablet 50 mg  50 mg Oral BID Oswald Hillock, MD   50 mg at 05/18/19 2249  . mupirocin ointment (BACTROBAN) 2 %   Nasal BID Manuella Ghazi, Pratik D, DO      . nystatin (MYCOSTATIN/NYSTOP) topical powder   Topical Daily PRN Oswald Hillock, MD      . ondansetron [facility]) tablet 4 mg  4 mg Oral Q6H PRN Oswald Hillock, MD   4 mg at 05/11/19 2117   Or  . ondansetron (ZOFRAN) injection 4 mg  4 mg Intravenous Q6H PRN Oswald Hillock, MD      . oxyCODONE (Oxy IR/ROXICODONE) immediate  release tablet 5-10 mg  5-10 mg Oral Q6H PRN Oswald Hillock, MD   5 mg at 05/13/19 2017  . pantoprazole (PROTONIX) EC tablet 40 mg  40 mg Oral QAC breakfast Oswald Hillock, MD   40 mg at 05/18/19 0816  . potassium chloride SA (KLOR-CON) CR tablet 40 mEq  40 mEq Oral Daily Barton Dubois, MD   40 mEq at 05/18/19 0815  . sodium chloride flush (NS) 0.9 % injection 10-40 mL  10-40 mL Intracatheter Q12H Shah, Pratik D, DO   10 mL at 05/17/19 2137  . sodium chloride flush (NS) 0.9 % injection 10-40 mL  10-40 mL Intracatheter PRN Manuella Ghazi, Pratik D, DO   10 mL at 05/17/19 2137     Discharge Medications: Please  see discharge summary for a list of discharge medications.  Relevant Imaging Results:  Relevant Lab Results:   Additional Information SSN: [id number]  Ihor Gully, LCSW

## 2019-05-19 NOTE — Progress Notes (Signed)
PROGRESS NOTE    [name]  JAS:[id number] DOB: [birth year] DOA: 05/09/2019 PCP: [name], MD   Brief Narrative:  Per HPI: [name] a68 y.o.female,with history of atrial fibrillation on anticoagulation therapy with apixaban, diabetes mellitus type 2, LV thrombus, hypertension, depression, abdominal wound infection,? Pyoderma gangrenosum, currently on IV Invanz. Patient was recently discharged from the hospital on 04/30/2019 when she was admitted with abdominal wound infection and was discharged on Invanz for 21 days. As per patient she also has been taking high-dose prednisone which was prescribed by general surgery on 03/09/2019 after she had debridement of the abdominal wound and biopsy came back? Pyoderma gangrenosum.  As per patient, she noticed that her blood sugar has been very high at home she takes 60 mg of prednisone daily. She was not discharged on prednisone on 04/30/2019 as per discharge summary.  She denies chest pain but complains of palpitations. Admits to having shortness of breath. Complains of nausea but denies vomiting and diarrhea. Denies abdominal pain or dysuria.  In the ED patient started on IV Cardizem infusion for atrial fibrillation with RVR.  9/22: Patient noted to have significant hypoglycemia. She is having very poor oral intake overall. Heart rate appears to be better controlled.  9/23: Hypoglycemia has improved as well as heart rate. She still is on minimal dose of IV Cardizem which will not be discontinued and hopefully, patient can be transferred to telemetry floor today. She is noted to have some mild delirium as well.  9/24: Patient continued to have episodes of hypoglycemia overnight and required D10 infusion temporarily. She continues to eat well and has less delirium today. Potassium and magnesium still a little bit on the low side.  9/25:Patient appears to be stabilizing from a blood glucose standpoint on  sliding scale, but will monitor 24 more hours to ensure stability prior to discharge. She also appears to have less delirium and potassium is improved.  9/26:Patient appears to be stable from a blood glucose standpoint and her heart rate is controlled. Unfortunately, she is having some persistent confusion and delirium. Her PICC line is also nonfunctioning at this point and will require an exchange prior to going back home.  9/27: Patient was given Seroquel overnight, but is still confused and disoriented. Heart rate and blood glucose is well controlled at this point. PICC line will require repositioning by [facility] today. We will plan to diurese further and increased dose of Seroquel tonight.  9/28: Patient has worsening confusion and persistent encephalopathy after being given full dose of Seroquel last night.  We will plan to hold this at the moment.  Will do other work-up and continue monitoring.  PT evaluation with possible need for SNF on discharge.  9/29: Patient continues have persistent encephalopathic state.  She is very poorly responsive and appears to have brief episodes of psychomotor agitation interrupted by somnolence.  We will continue current antibiotic treatment with Invanz to continue through 10/3.  Neurology evaluation pending.  EEG ordered as well as CT head for further evaluation.  Assessment & Plan:   Active Problems:   Atrial fibrillation with RVR (HCC)   Acute encephalopathy-unknown cause -Initially thought that this was related to hospital acquired delirium and patient was given Seroquel, given lack of improvement medication has been discontinued. -Continue holding Wellbutrin and escitalopram. -CT head without acute intracranial abnormalities and EEG no demonstrating acute epileptic waveforms. -Appreciate neurology evaluation -Ammonia, TSH, and venous blood gas within normal limits -Continue to monitor closely  Atrial fibrillation with  RVR-resolved -Continue telemetry monitoring -Continue oral Cardizem and metoprolol -Follow-up vital signs and heart rate with further adjustment to medication as needed -Continue Eliquis for secondary prevention.  Generalized weakness -Patient evaluated by PT and found severely deconditioned with recommendation for skilled nursing facility at time of discharge for rehabilitation -Social worker has been made aware for assistance with placement.  Mild hypokalemia -Continue to follow electrolytes trend and further replete as needed.  Chronic abdominal wound with pyoderma gangrenosum -Continue IV Invanz through 10/3 -PICC lineadjusted 9/27 -Appreciate general surgery recommendations and will continue wound dressings and changes -no steroids therapy currently -will need outpatient follow up with dermatology service  Type 2 diabetes with labile blood glucose readings-resolved -Continue on SSI as needed with Accu-Cheks everyQAC-QHS -Continue carb modified diet -Appreciate diabetes coordinator recommendations;A1c noted to be 8.8% -Will need endocrinology outpatient follow-up soon for insulin pump if she is a candidate for it.  Acute on chronic diastolic heart failure -improved -will resume PO lasix on 10/1 -encourage oral intake and proper hydration. -Follow daily weights. -Volume status compensated at this moment.  DVT prophylaxis:Apixaban Code Status:Full Family Communication:no family at bedside.  Disposition Plan:Heart rate and blood glucose have improved during this hospitalization and these were the initial complaints.  Unfortunately, she now has some persistent, acute encephalopathy that is being investigated. Appreciate neurology service assistance and recommendations.  Physical therapy has recommended skilled nursing facility for rehabilitation.   Consultants:  General surgery peripherally following  Neurology   Procedures:  EEG: Negative for acute  epileptic waveforms; positive for encephalopathy changes.  CT head: No acute intracranial abnormalities.  Antimicrobials:  Anti-infectives (From admission, onward)   Start     Dose/Rate Route Frequency Ordered Stop   05/14/19 1700  ertapenem (INVANZ) 1,000 mg in sodium chloride 0.9 % 100 mL IVPB     1 g 200 mL/hr over 30 Minutes Intravenous Every 24 hours 05/14/19 1409     05/10/19 1300  ertapenem (INVANZ) 1,000 mg in sodium chloride 0.9 % 100 mL IVPB  Status:  Discontinued     1 g 200 mL/hr over 30 Minutes Intravenous Every 24 hours 05/09/19 2201 05/14/19 1406      Subjective: No fever, no chest pain, no shortness of breath.  Patient oriented x2 intermittently; slowly making progress regarding better orientation and mentation and stability.  Significantly weak and deconditioned requiring extra assistance to perform minimal activities.  Objective: Vitals:   05/18/19 0558 05/18/19 2137 05/19/19 0459 05/19/19 1418  BP: (!) 108/58 (!) 128/47 130/83 (!) 146/76  Pulse: 61 63 (!) 110 87  Resp: 16 18 18 19   Temp: 98.1 F (36.7 C) 98.1 F (36.7 C) 97.7 F (36.5 C) 97.8 F (36.6 C)  TempSrc: Oral Oral Oral Oral  SpO2: 92% 97% 96% 100%  Weight:      Height:        Intake/Output Summary (Last 24 hours) at 05/19/2019 1725 Last data filed at 05/19/2019 1053 Gross per 24 hour  Intake 40 ml  Output --  Net 40 ml   Filed Weights   05/09/19 1708 05/11/19 0500 05/17/19 0008  Weight: 89.8 kg 95.1 kg 87 kg    Examination: General exam: Alert, awake, oriented x 2; afebrile, no chest pain, no nausea, no vomiting. Respiratory system: Clear to auscultation. Respiratory effort normal. Cardiovascular system: rate controlled. No murmurs, rubs or gallops. Gastrointestinal system: Abdomen is nondistended, soft and nontender. No organomegaly or masses felt. Normal bowel sounds heard. Central nervous system: Alert and oriented x2.  Able to move 4 limbs spontaneously. Extremities: No cyanosis or  clubbing Skin: No rashes or petechiae; patient abdominal wound healing appropriately without signs of superimposed infection, no active drainage and no erythema. Psychiatry: Mood & affect appropriate.    Data Reviewed: I have personally reviewed following labs and imaging studies  CBC: Recent Labs  Lab 05/16/19 0537 05/17/19 0613 05/18/19 0451  WBC 9.9 10.8* 9.5  HGB 9.0* 10.0* 9.5*  HCT 30.3* 34.6* 32.8*  MCV 85.1 87.2 87.9  PLT 371 421* 875*   Basic Metabolic Panel: Recent Labs  Lab 05/13/19 0549  05/15/19 0608 05/16/19 0537 05/17/19 0613 05/18/19 0451 05/19/19 0500  NA 139   < > 138 139 140 138 140  K 5.0   < > 4.2 3.3* 3.4* 3.0* 3.7  CL 105   < > 100 95* 94* 97* 102  CO2 27   < > 30 31 33* 29 27  GLUCOSE 99   < > 70 93 111* 79 79  BUN 13   < > 11 10 9 8 8   CREATININE 0.72   < > 0.78 0.90 1.03* 0.91 0.85  CALCIUM 7.9*   < > 8.5* 8.1* 8.1* 7.6* 7.9*  MG 1.8  --   --  1.2* 1.5* 1.8 1.7   < > = values in this interval not displayed.   GFR: Estimated Creatinine Clearance: 67.6 mL/min (by C-G formula based on SCr of 0.85 mg/dL).   Recent Labs  Lab 05/16/19 1135  AMMONIA 16   CBG: Recent Labs  Lab 05/18/19 1615 05/19/19 0045 05/19/19 0502 05/19/19 1100 05/19/19 1603  GLUCAP 84 68* 83 86 70   Thyroid Function Tests: No results for input(s): TSH, T4TOTAL, FREET4, T3FREE, THYROIDAB in the last 72 hours.  Recent Results (from the past 240 hour(s))  SARS Coronavirus 2 [name] order, Performed in [facility] hospital lab) Nasopharyngeal Nasopharyngeal Swab     Status: None   Collection Time: 05/09/19  5:37 PM   Specimen: Nasopharyngeal Swab  Result Value Ref Range Status   SARS Coronavirus 2 NEGATIVE NEGATIVE Final    Comment: (NOTE) If result is NEGATIVE SARS-CoV-2 target nucleic acids are NOT DETECTED. The SARS-CoV-2 RNA is generally detectable in upper and lower  respiratory specimens during the acute phase of infection. The lowest  concentration of  SARS-CoV-2 viral copies this assay can detect is 250  copies / mL. A negative result does not preclude SARS-CoV-2 infection  and should not be used as the sole basis for treatment or other  patient management decisions.  A negative result may occur with  improper specimen collection / handling, submission of specimen other  than nasopharyngeal swab, presence of viral mutation(s) within the  areas targeted by this assay, and inadequate number of viral copies  (<250 copies / mL). A negative result must be combined with clinical  observations, patient history, and epidemiological information. If result is POSITIVE SARS-CoV-2 target nucleic acids are DETECTED. The SARS-CoV-2 RNA is generally detectable in upper and lower  respiratory specimens dur ing the acute phase of infection.  Positive  results are indicative of active infection with SARS-CoV-2.  Clinical  correlation with patient history and other diagnostic information is  necessary to determine patient infection status.  Positive results do  not rule out bacterial infection or co-infection with other viruses. If result is PRESUMPTIVE POSTIVE SARS-CoV-2 nucleic acids MAY BE PRESENT.   A presumptive positive result was obtained on the submitted specimen  and confirmed on repeat  testing.  While 2019 novel coronavirus  (SARS-CoV-2) nucleic acids may be present in the submitted sample  additional confirmatory testing may be necessary for epidemiological  and / or clinical management purposes  to differentiate between  SARS-CoV-2 and other Sarbecovirus currently known to infect humans.  If clinically indicated additional testing with an alternate test  methodology [phone]) is advised. The SARS-CoV-2 RNA is generally  detectable in upper and lower respiratory sp ecimens during the acute  phase of infection. The expected result is Negative. Fact Sheet for Patients:  StrictlyIdeas.no Fact Sheet for Healthcare  Providers: BankingDealers.co.za This test is not yet approved or cleared by the [place] FDA and has been authorized for detection and/or diagnosis of SARS-CoV-2 by FDA under an Emergency Use Authorization (EUA).  This EUA will remain in effect (meaning this test can be used) for the duration of the COVID-19 declaration under Section 564(b)(1) of the Act, 21 U.S.C. section 360bbb-3(b)(1), unless the authorization is terminated or revoked sooner. Performed at [facility], [address], [place], [place] [address]   MRSA PCR Screening     Status: Abnormal   Collection Time: 05/09/19 10:04 PM   Specimen: Nasal Mucosa; Nasopharyngeal  Result Value Ref Range Status   MRSA by PCR POSITIVE (A) NEGATIVE Final    Comment:        The GeneXpert MRSA Assay (FDA approved for NASAL specimens only), is one component of a comprehensive MRSA colonization surveillance program. It is not intended to diagnose MRSA infection nor to guide or monitor treatment for MRSA infections. RESULT CALLED TO, READ BACK BY AND VERIFIED WITH: Nicholes Rough @0529  05/10/19 [facility] Performed at [facility], [address], [place], [place] [address]      Radiology Studies: No results found.  Scheduled Meds:  apixaban  5 mg Oral BID   Chlorhexidine Gluconate Cloth  6 each Topical Daily   diltiazem  120 mg Oral Daily   feeding supplement (ENSURE ENLIVE)  237 mL Oral BID BM   ferrous sulfate  325 mg Oral BID WC   insulin aspart  0-7 Units Subcutaneous Q6H   metoprolol tartrate  50 mg Oral BID   mupirocin ointment   Nasal BID   pantoprazole  40 mg Oral QAC breakfast   potassium chloride  40 mEq Oral Daily   sodium chloride flush  10-40 mL Intracatheter Q12H   Continuous Infusions:  ertapenem 1,000 mg (05/18/19 1619)     LOS: 10 days    Time spent: 30 minutes    Barton Dubois, MD [facility] Pager [phone]  05/19/2019, 5:25 PM

## 2019-05-19 NOTE — TOC Progression Note (Signed)
Transition of Care [facility]) - Progression Note    Patient Details  Name: [name] MRN: [id number] Date of Birth: [birth year]  Transition of Care [facility]) CM/SW Contact  Ihor Gully, LCSW Phone Number: 05/19/2019, 9:42 AM  Clinical Narrative:    Spouse, Mr. Mcanany, would like for patient to be considered at [facility]. Choices were provided and referrals sent to facilities requested.    Expected Discharge Plan: [place]    Expected Discharge Plan and Services Expected Discharge Plan: [place]                                               Social Determinants of Health (SDOH) Interventions    Readmission Risk Interventions Readmission Risk Prevention Plan 05/10/2019 04/30/2019 04/29/2019  Transportation Screening Complete Complete Complete  Medication Review Press photographer) Complete Complete Complete  PCP or Specialist appointment within 3-5 days of discharge Complete Complete Not Complete  PCP/Specialist Appt Not Complete comments Going tomorrow after discharge. - -  HRI or Home Care Consult Complete Complete Complete  SW Recovery Care/Counseling Consult Complete Patient refused Complete  Palliative Care Screening - Not Applicable Not Complete  Skilled Nursing Facility Not Complete Not Applicable Not Complete  Some recent data might be hidden

## 2019-05-20 LAB — CBC
HCT: 36.1 % (ref 36.0–46.0)
Hemoglobin: 10.2 g/dL — ABNORMAL LOW (ref 12.0–15.0)
MCH: 25.2 pg — ABNORMAL LOW (ref 26.0–34.0)
MCHC: 28.3 g/dL — ABNORMAL LOW (ref 30.0–36.0)
MCV: 89.1 fL (ref 80.0–100.0)
Platelets: 405 10*3/uL — ABNORMAL HIGH (ref 150–400)
RBC: 4.05 MIL/uL (ref 3.87–5.11)
RDW: 16.7 % — ABNORMAL HIGH (ref 11.5–15.5)
WBC: 8.1 10*3/uL (ref 4.0–10.5)
nRBC: 0 % (ref 0.0–0.2)

## 2019-05-20 LAB — GLUCOSE, CAPILLARY
Glucose-Capillary: 271 mg/dL — ABNORMAL HIGH (ref 70–99)
Glucose-Capillary: 71 mg/dL (ref 70–99)
Glucose-Capillary: 80 mg/dL (ref 70–99)
Glucose-Capillary: 94 mg/dL (ref 70–99)

## 2019-05-20 MED ORDER — FUROSEMIDE 20 MG PO TABS
20.0000 mg | ORAL_TABLET | Freq: Every day | ORAL | Status: DC
  Administered 2019-05-20 – 2019-05-25 (×6): 20 mg via ORAL
  Filled 2019-05-20 (×6): qty 1

## 2019-05-20 NOTE — Progress Notes (Signed)
PROGRESS NOTE    TAMEIKA HECKMANN  HWT:[id number] DOB: 1951-03-11 DOA: 05/09/2019 PCP: [name], MD   Brief Narrative:  Per HPI: [name] a68 y.o.female,with history of atrial fibrillation on anticoagulation therapy with apixaban, diabetes mellitus type 2, LV thrombus, hypertension, depression, abdominal wound infection,? Pyoderma gangrenosum, currently on IV Invanz. Patient was recently discharged from the hospital on 04/30/2019 when she was admitted with abdominal wound infection and was discharged on Invanz for 21 days. As per patient she also has been taking high-dose prednisone which was prescribed by general surgery on 03/09/2019 after she had debridement of the abdominal wound and biopsy came back? Pyoderma gangrenosum.  As per patient, she noticed that her blood sugar has been very high at home she takes 60 mg of prednisone daily. She was not discharged on prednisone on 04/30/2019 as per discharge summary.  She denies chest pain but complains of palpitations. Admits to having shortness of breath. Complains of nausea but denies vomiting and diarrhea. Denies abdominal pain or dysuria.  In the ED patient started on IV Cardizem infusion for atrial fibrillation with RVR.   Assessment & Plan:   Active Problems:   Atrial fibrillation with RVR (HCC)   Acute encephalopathy-unknown cause -Initially thought that this was related to hospital acquired delirium and patient was given Seroquel, given lack of improvement, medication has been discontinued. -Continue holding Wellbutrin and escitalopram for now.. -CT head without acute intracranial abnormalities and EEG no demonstrating acute epileptic waveforms. -appreciate neurology evaluation -Ammonia, TSH, and venous blood gas within normal limits -continue to monitor closely  Atrial fibrillation with RVR-resolved -continue telemetry monitoring -continue oral Cardizem and metoprolol -follow-up vital signs  and heart rate with further adjustments to medication as needed.  -continue Eliquis for secondary prevention. -Heart rate is now a stable and controlled.  Generalized weakness -patient evaluated by PT and found severely deconditioned with recommendation for skilled nursing facility at time of discharge for rehabilitation.  -Social worker has been made aware for assistance with placement.   Mild hypokalemia -continue to follow electrolytes trend and further replete as needed  Chronic abdominal wound with pyoderma gangrenosum -continue IV Invanz though 10/3 -PICC lineadjusted 9/27 -appreciate general surgery recommendations and will continue wound dressings and changes -not on steroid therapy currently -will need outpatient follow up with dermatology service  Type 2 diabetes with labile blood glucose readings-resolved -continue sliding scale insulin as needed with Accu-Cheks everyQAC-QHS -continue carb modified diet -Appreciate diabetes coordinator recommendations; A1c is 8.8% -Will need endocrinology outpatient follow-up soon for insulin pump if she is a candidate for it.  Acute on chronic diastolic heart failure -improved -will start PO lasix 20 mg daily -encourage oral intake and proper hydration. -follow daily weights -volume status is compensated at this time.  DVT prophylaxis: Apixaban Code Status:Full code Family Communication:no family at bedside Disposition Plan:Heart rate and blood glucose have remained under control. She continues to show mild intermittent confusion and mainly flipping circadian rhythm. Appreciate neurology service assistance and recommendations. Physical therapy has recommended skilled nursing facility for rehabitation.  Consultants:  General surgery peripherally following  Neurology   Procedures:  EEG: Negative for acute epileptic waveforms; positive for encephalopathy changes.  CT head: No acute intracranial abnormalities.   Antimicrobials:  Anti-infectives (From admission, onward)   Start     Dose/Rate Route Frequency Ordered Stop   05/14/19 1700  ertapenem (INVANZ) 1,000 mg in sodium chloride 0.9 % 100 mL IVPB     1 g 200 mL/hr over 30 Minutes  Intravenous Every 24 hours 05/14/19 1409     05/10/19 1300  ertapenem (INVANZ) 1,000 mg in sodium chloride 0.9 % 100 mL IVPB  Status:  Discontinued     1 g 200 mL/hr over 30 Minutes Intravenous Every 24 hours 05/09/19 2201 05/14/19 1406      Subjective: No fever, no chest pain, no shortness of breath. Patient oriented x2 intermittently; keeps making progress regarding better orientation and mentation stability. Significantly weak and deconditioned. Patient requires extra assistance to perform minimal activities.   Objective: Vitals:   05/19/19 0459 05/19/19 1418 05/19/19 2139 05/20/19 0546  BP: 130/83 (!) 146/76 135/76 129/63  Pulse: (!) 110 87 75 70  Resp: 18 19 15 16   Temp: 97.7 F (36.5 C) 97.8 F (36.6 C) 98.3 F (36.8 C) 98.1 F (36.7 C)  TempSrc: Oral Oral    SpO2: 96% 100% 99% 97%  Weight:      Height:        Intake/Output Summary (Last 24 hours) at 05/20/2019 1231 Last data filed at 05/20/2019 0500 Gross per 24 hour  Intake 130 ml  Output 700 ml  Net -570 ml   Filed Weights   05/09/19 1708 05/11/19 0500 05/17/19 0008  Weight: 89.8 kg 95.1 kg 87 kg    Examination: General exam: Alert, awake, oriented x 2; afebrile, no chest pain, no nausea, no vomiting. Respiratory system: Clear to auscultation. Respiratory effort normal. Cardiovascular system:rate controlled. No murmurs, gallops or rubs. Gastrointestinal system: Abdomen is nondistended, soft and nontender. No organomegaly or masses felt. Normal bowel sounds heard. Central nervous system: Alert and oriented x 2. Able to move all 4 limbs spontaneously. Extremities: No cyanosis or clubbing Skin: No rashes or petechiae; patients abdominal wound healing appropriately without signs of  superimposed infection, no active drainage and no erythema.  Psychiatry: Mood & affect appropriate.    Data Reviewed: I have personally reviewed following labs and imaging studies  CBC: Recent Labs  Lab 05/16/19 0537 05/17/19 0613 05/18/19 0451 05/20/19 0502  WBC 9.9 10.8* 9.5 8.1  HGB 9.0* 10.0* 9.5* 10.2*  HCT 30.3* 34.6* 32.8* 36.1  MCV 85.1 87.2 87.9 89.1  PLT 371 421* 402* 967*   Basic Metabolic Panel: Recent Labs  Lab 05/15/19 0608 05/16/19 0537 05/17/19 0613 05/18/19 0451 05/19/19 0500  NA 138 139 140 138 140  K 4.2 3.3* 3.4* 3.0* 3.7  CL 100 95* 94* 97* 102  CO2 30 31 33* 29 27  GLUCOSE 70 93 111* 79 79  BUN 11 10 9 8 8   CREATININE 0.78 0.90 1.03* 0.91 0.85  CALCIUM 8.5* 8.1* 8.1* 7.6* 7.9*  MG  --  1.2* 1.5* 1.8 1.7   GFR: Estimated Creatinine Clearance: 67.6 mL/min (by C-G formula based on SCr of 0.85 mg/dL).   Recent Labs  Lab 05/16/19 1135  AMMONIA 16   CBG: Recent Labs  Lab 05/19/19 0502 05/19/19 1100 05/19/19 1603 05/20/19 0003 05/20/19 0543  GLUCAP 83 86 70 80 71    Radiology Studies: No results found.  Scheduled Meds: . apixaban  5 mg Oral BID  . Chlorhexidine Gluconate Cloth  6 each Topical Daily  . diltiazem  120 mg Oral Daily  . feeding supplement (ENSURE ENLIVE)  237 mL Oral BID BM  . ferrous sulfate  325 mg Oral BID WC  . insulin aspart  0-7 Units Subcutaneous Q6H  . metoprolol tartrate  50 mg Oral BID  . mupirocin ointment   Nasal BID  . pantoprazole  40  mg Oral QAC breakfast  . potassium chloride  40 mEq Oral Daily  . sodium chloride flush  10-40 mL Intracatheter Q12H   Continuous Infusions: . ertapenem 1,000 mg (05/19/19 1811)     LOS: 11 days    Time spent: 30 minutes    Barton Dubois, MD [facility] Pager [phone]  05/20/2019, 12:31 PM

## 2019-05-20 NOTE — TOC Progression Note (Signed)
Transition of Care [facility]) - Progression Note    Patient Details  Name: [name] MRN: [id number] Date of Birth: 17-Mar-1951  Transition of Care [facility]) CM/SW Contact  Ihor Gully, LCSW Phone Number: 05/20/2019, 9:00 AM  Clinical Narrative:    Marianna Fuss at [facility] advised that patient has a balance of $450. She could not make a bed offer until balance was paid. [name] advised of balance. He states that he will pay balance today.    Expected Discharge Plan: [place]    Expected Discharge Plan and Services Expected Discharge Plan: [place]                                               Social Determinants of Health (SDOH) Interventions    Readmission Risk Interventions Readmission Risk Prevention Plan 05/10/2019 04/30/2019 04/29/2019  Transportation Screening Complete Complete Complete  Medication Review [employer]) Complete Complete Complete  PCP or Specialist appointment within 3-5 days of discharge Complete Complete Not Complete  PCP/Specialist Appt Not Complete comments Going tomorrow after discharge. - -  HRI or Home Care Consult Complete Complete Complete  SW Recovery Care/Counseling Consult Complete Patient refused Complete  Palliative Care Screening - Not Applicable Not Complete  Skilled Nursing Facility Not Complete Not Applicable Not Complete  Some recent data might be hidden

## 2019-05-20 NOTE — TOC Progression Note (Signed)
Transition of Care [facility]) - Progression Note    Patient Details  Name: [name] MRN: [id number] Date of Birth: 09-01-1950  Transition of Care [facility]) CM/SW Contact  Ihor Gully, LCSW Phone Number: 05/20/2019, 4:26 PM  Clinical Narrative:    Spouse paid $450 balance. Kerri at [facility] started insurance authorization.    Expected Discharge Plan: [place]    Expected Discharge Plan and Services Expected Discharge Plan: [place]                                               Social Determinants of Health (SDOH) Interventions    Readmission Risk Interventions Readmission Risk Prevention Plan 05/10/2019 04/30/2019 04/29/2019  Transportation Screening Complete Complete Complete  Medication Review Press photographer) Complete Complete Complete  PCP or Specialist appointment within 3-5 days of discharge Complete Complete Not Complete  PCP/Specialist Appt Not Complete comments Going tomorrow after discharge. - -  HRI or Home Care Consult Complete Complete Complete  SW Recovery Care/Counseling Consult Complete Patient refused Complete  Palliative Care Screening - Not Applicable Not Complete  Skilled Nursing Facility Not Complete Not Applicable Not Complete  Some recent data might be hidden

## 2019-05-20 NOTE — Plan of Care (Signed)
  Problem: Education: Goal: Knowledge of General Education information will improve Description Including pain rating scale, medication(s)/side effects and non-pharmacologic comfort measures Outcome: Progressing   

## 2019-05-21 LAB — GLUCOSE, CAPILLARY
Glucose-Capillary: 150 mg/dL — ABNORMAL HIGH (ref 70–99)
Glucose-Capillary: 154 mg/dL — ABNORMAL HIGH (ref 70–99)
Glucose-Capillary: 166 mg/dL — ABNORMAL HIGH (ref 70–99)
Glucose-Capillary: 187 mg/dL — ABNORMAL HIGH (ref 70–99)
Glucose-Capillary: 229 mg/dL — ABNORMAL HIGH (ref 70–99)

## 2019-05-21 NOTE — Progress Notes (Signed)
PROGRESS NOTE    [name]  LGX:[id number] DOB: [birth year] DOA: 05/09/2019 PCP: [name], MD   Brief Narrative:  Per HPI: [name] a68 y.o.female,with history of atrial fibrillation on anticoagulation therapy with apixaban, diabetes mellitus type 2, LV thrombus, hypertension, depression, abdominal wound infection,? Pyoderma gangrenosum, currently on IV Invanz. Patient was recently discharged from the hospital on 04/30/2019 when she was admitted with abdominal wound infection and was discharged on Invanz for 21 days. As per patient she also has been taking high-dose prednisone which was prescribed by general surgery on 03/09/2019 after she had debridement of the abdominal wound and biopsy came back? Pyoderma gangrenosum.  As per patient, she noticed that her blood sugar has been very high at home she takes 60 mg of prednisone daily. She was not discharged on prednisone on 04/30/2019 as per discharge summary.  She denies chest pain but complains of palpitations. Admits to having shortness of breath. Complains of nausea but denies vomiting and diarrhea. Denies abdominal pain or dysuria.  In the ED patient started on IV Cardizem infusion for atrial fibrillation with RVR.   Assessment & Plan:   Active Problems:   Atrial fibrillation with RVR (HCC)   Acute encephalopathy-unknown cause -Initially thought that this was related to hospital acquired delirium and patient was given Seroquel, given lack of improvement, medication has been discontinued. -Continue to hold Wellbutrin and escitalopram for now. -CT head without acute intracranial abnormalities and EEG not demonstrating acute epileptic waveforms.  -Appreciate neurology evaluation. -Ammonia, TSH, and venous blood gas within normal limits.  -Continue to monitor closely.  Atrial fibrillation with RVR-resolved -Continue telemetry monitoring. -Continue metoprolol and oral Cardizem. -Follow-up vital  signs and heart rate with further adjustments to medication as needed.   -Continue Eliquis for secondary prevention. -Heart rate is stable and controlled.  Generalized weakness -Patient evaluated by PT and found severely deconditioned with recommendation for skilled nursing facility at time of discharge for rehabilitation.  -Social worker has been made aware for assistance with placement. .   Mild hypokalemia -Continue to follow electrolytes trend and further replete as needed  Chronic abdominal wound with pyoderma gangrenosum -Completing IV Invanz today, 10/3 -PICC line adjusted 9/27 -Appreciate general surgery recommendations and will continue wound dressings and changes -Currently not on steroid therapy -Will need outpatient follow up with dermatology service  Type 2 diabetes with labile blood glucose readings-resolved -Continue SSI as needed with Accu-Cheks every QAC-QHS -Continue carb modified diet -Appreciate diabetes coordinator recommendations; A1c is 8.8% -Will need endocrinology outpatient follow-up soon for insulin pump if she is a candidate for it.  Acute on chronic diastolic heart failure -Improved -Continue Lasix 20 mg PO daily -Encourage oral intake and proper hydration. -Follow daily weights -Volume status is compensated at this time.  DVT prophylaxis:  Apixaban Code Status:Full code Family Communication:No family at bedside  Disposition Plan:Heart rate and blood glucose remain under control. Heart rate and blood glucose have remained under control. Patient was more alert during today's visit; expressed that she was able to rest through the night. Appreciate neurology service assistance and recommendations. Physical therapy has recommended skilled nursing facility for rehabilitation. She is medically stable for discharge once insurance authorization/SNF bed available.  Consultants:  General surgery peripherally following  Neurology   Procedures:   EEG: Negative for acute epileptic waveforms; positive for encephalopathy changes.  CT head: No acute intracranial abnormalities.  Antimicrobials:  Anti-infectives (From admission, onward)   Start     Dose/Rate Route Frequency Ordered Stop  05/14/19 1700  ertapenem (INVANZ) 1,000 mg in sodium chloride 0.9 % 100 mL IVPB     1 g 200 mL/hr over 30 Minutes Intravenous Every 24 hours 05/14/19 1409     05/10/19 1300  ertapenem (INVANZ) 1,000 mg in sodium chloride 0.9 % 100 mL IVPB  Status:  Discontinued     1 g 200 mL/hr over 30 Minutes Intravenous Every 24 hours 05/09/19 2201 05/14/19 1406      Subjective: No fever, no chest pain, no shortness of breath. The patient is oriented x 3 today. Alert and answering questions appropriately. Continues to be weak and deconditioned; she requires extra assistance to perform minimal activities.  Objective: Vitals:   05/20/19 0546 05/20/19 1643 05/20/19 2306 05/21/19 0607  BP: 129/63 (!) 108/51 112/79 (!) 117/53  Pulse: 70 (!) 109 72 80  Resp: 16 20 20 16   Temp: 98.1 F (36.7 C) 98.4 F (36.9 C) 98 F (36.7 C) 98.2 F (36.8 C)  TempSrc:  Oral Oral   SpO2: 97% 97% 95% 97%  Weight:      Height:        Intake/Output Summary (Last 24 hours) at 05/21/2019 1020 Last data filed at 05/21/2019 0606 Gross per 24 hour  Intake 240 ml  Output 600 ml  Net -360 ml   Filed Weights   05/09/19 1708 05/11/19 0500 05/17/19 0008  Weight: 89.8 kg 95.1 kg 87 kg    Examination: General exam: Alert, awake, oriented x 3; afebrile, no chest pain, no nausea, no vomiting. More alert today; answering questions appropriately  Respiratory system: Clear to auscultation. Respiratory effort normal. Cardiovascular system: rate controlled. No murmurs, rubs, gallops. Gastrointestinal system: Abdomen is nondistended, soft and nontender. No organomegaly or masses felt. Normal bowel sounds heard. Central nervous system: Alert and oriented x3. No focal neurological  deficits. Extremities: No cyanosis or clubbing. +pedal pulses Skin: No rashes or petechiae; patients abdominal wound healing appropriately without signs of infection; no active drainage and no erythema. Psychiatry:  Mood & affect appropriate.     Data Reviewed: I have personally reviewed following labs and imaging studies  CBC: Recent Labs  Lab 05/16/19 0537 05/17/19 0613 05/18/19 0451 05/20/19 0502  WBC 9.9 10.8* 9.5 8.1  HGB 9.0* 10.0* 9.5* 10.2*  HCT 30.3* 34.6* 32.8* 36.1  MCV 85.1 87.2 87.9 89.1  PLT 371 421* 402* 462*   Basic Metabolic Panel: Recent Labs  Lab 05/15/19 0608 05/16/19 0537 05/17/19 0613 05/18/19 0451 05/19/19 0500  NA 138 139 140 138 140  K 4.2 3.3* 3.4* 3.0* 3.7  CL 100 95* 94* 97* 102  CO2 30 31 33* 29 27  GLUCOSE 70 93 111* 79 79  BUN 11 10 9 8 8   CREATININE 0.78 0.90 1.03* 0.91 0.85  CALCIUM 8.5* 8.1* 8.1* 7.6* 7.9*  MG  --  1.2* 1.5* 1.8 1.7   GFR: Estimated Creatinine Clearance: 67.6 mL/min (by C-G formula based on SCr of 0.85 mg/dL).   Recent Labs  Lab 05/16/19 1135  AMMONIA 16   CBG: Recent Labs  Lab 05/20/19 1240 05/20/19 1802 05/21/19 0003 05/21/19 0604 05/21/19 0726  GLUCAP 94 271* 229* 166* 187*    Radiology Studies: No results found.  Scheduled Meds: . apixaban  5 mg Oral BID  . Chlorhexidine Gluconate Cloth  6 each Topical Daily  . diltiazem  120 mg Oral Daily  . feeding supplement (ENSURE ENLIVE)  237 mL Oral BID BM  . ferrous sulfate  325 mg Oral BID  WC  . furosemide  20 mg Oral Daily  . insulin aspart  0-7 Units Subcutaneous Q6H  . metoprolol tartrate  50 mg Oral BID  . mupirocin ointment   Nasal BID  . pantoprazole  40 mg Oral QAC breakfast  . potassium chloride  40 mEq Oral Daily  . sodium chloride flush  10-40 mL Intracatheter Q12H   Continuous Infusions: . ertapenem 1,000 mg (05/20/19 1648)     LOS: 12 days    Time spent: 30 minutes    Barton Dubois, MD [facility] Pager  [phone]  05/21/2019, 10:20 AM

## 2019-05-22 LAB — GLUCOSE, CAPILLARY
Glucose-Capillary: 155 mg/dL — ABNORMAL HIGH (ref 70–99)
Glucose-Capillary: 162 mg/dL — ABNORMAL HIGH (ref 70–99)
Glucose-Capillary: 172 mg/dL — ABNORMAL HIGH (ref 70–99)
Glucose-Capillary: 214 mg/dL — ABNORMAL HIGH (ref 70–99)

## 2019-05-22 MED ORDER — OXYCODONE HCL 5 MG PO TABS
5.0000 mg | ORAL_TABLET | Freq: Three times a day (TID) | ORAL | Status: DC | PRN
  Administered 2019-05-22: 5 mg via ORAL
  Filled 2019-05-22: qty 1

## 2019-05-22 MED ORDER — QUETIAPINE FUMARATE 25 MG PO TABS
12.5000 mg | ORAL_TABLET | Freq: Every day | ORAL | Status: DC
  Administered 2019-05-22 – 2019-05-24 (×3): 12.5 mg via ORAL
  Filled 2019-05-22 (×3): qty 1

## 2019-05-22 NOTE — Progress Notes (Signed)
PROGRESS NOTE    [name]  LPF:[id number] DOB: 1951/03/30 DOA: 05/09/2019 PCP: [name], MD   Brief Narrative:  Per HPI: [name] a68 y.o.female,with history of atrial fibrillation on anticoagulation therapy with apixaban, diabetes mellitus type 2, LV thrombus, hypertension, depression, abdominal wound infection,? Pyoderma gangrenosum, currently on IV Invanz. Patient was recently discharged from the hospital on 04/30/2019 when she was admitted with abdominal wound infection and was discharged on Invanz for 21 days. As per patient she also has been taking high-dose prednisone which was prescribed by general surgery on 03/09/2019 after she had debridement of the abdominal wound and biopsy came back? Pyoderma gangrenosum.  As per patient, she noticed that her blood sugar has been very high at home she takes 60 mg of prednisone daily. She was not discharged on prednisone on 04/30/2019 as per discharge summary.  She denies chest pain but complains of palpitations. Admits to having shortness of breath. Complains of nausea but denies vomiting and diarrhea. Denies abdominal pain or dysuria.  In the ED patient started on IV Cardizem infusion for atrial fibrillation with RVR.   Assessment & Plan:   Active Problems:   Atrial fibrillation with RVR (HCC)   Acute encephalopathy-unknown cause -Initially thought that this was related to hospital acquired delirium and patient was given Seroquel, given lack of improvement initially, medication was discontinued. After reviewing med-rec's patient with mid potency dose and also still receiving other psychotropic agents.  -will Continue to hold Wellbutrin and escitalopram for now. -CT head without acute intracranial abnormalities and EEG not demonstrating acute epileptic waveforms.  -Patient expresses intermittent hallucinations which she is able to recognize -Will give another try with a low dose Seroquel at night  time only and follow response.  -Appreciate neurology evaluation. -Ammonia, TSH, and venous blood gas within normal limits.  -Continue to monitor closely.  Atrial fibrillation with RVR-resolved -Continue telemetry monitoring. -Continue metoprolol and oral Cardizem. -Follow-up vital signs and heart rate with further adjustments to medication as needed.   -Continue Eliquis for secondary prevention. -Heart rate is stable and controlled.  Generalized weakness -Patient evaluated by PT and found severely deconditioned with recommendation for skilled nursing facility at time of discharge for rehabilitation.  -Social worker has been made aware for assistance with placement. .   Mild hypokalemia -Continue to follow electrolytes trend and replete as needed.   Chronic abdominal wound with pyoderma gangrenosum -Antibiotic treatment completed on 10/3 -PICC line adjusted 9/27 -Appreciate general surgery recommendations and will continue wound dressings and changes -Currently not on steroid therapy -Will need outpatient follow up with dermatology service.  Type 2 diabetes with labile blood glucose readings-resolved -Continue SSI as needed with Accu-Cheks every QAC-QHS -Continue carb modified diet -Appreciate diabetes coordinator recommendations; A1c is 8.8% -Will need endocrinology outpatient follow-up soon for insulin pump if she is a candidate for it.  Acute on chronic diastolic heart failure -Improved -Continue Lasix 20 mg PO daily -Encourage oral intake and proper hydration. -Follow daily weights -Volume status is compensated at this time.  DVT prophylaxis:  Apixaban Code Status:Full code Family Communication:No family at bedside Disposition Plan:Heart rate and blood glucose remain under control. Patient was awake and able to carry a conversation coherently. Appreciate neurology service assistance and recommendations. Physical therapy has recommended skilled nursing facility  for rehabilitation. She is medically stable for discharge once insurance authorization and SNF bed is available.  Consultants:  General surgery peripherally following  Neurology   Procedures:  EEG: Negative for acute epileptic waveforms;  positive for encephalopathy changes.  CT head: No acute intracranial abnormalities.   Antimicrobials:  Anti-infectives (From admission, onward)   Start     Dose/Rate Route Frequency Ordered Stop   05/14/19 1700  ertapenem (INVANZ) 1,000 mg in sodium chloride 0.9 % 100 mL IVPB     1 g 200 mL/hr over 30 Minutes Intravenous Every 24 hours 05/14/19 1409     05/10/19 1300  ertapenem (INVANZ) 1,000 mg in sodium chloride 0.9 % 100 mL IVPB  Status:  Discontinued     1 g 200 mL/hr over 30 Minutes Intravenous Every 24 hours 05/09/19 2201 05/14/19 1406      Subjective: No fever, no chest pain, no shortness of breath. She is oriented x 3; but she is expressing intermittent visual hallucinations which she is able to recognize. The patient is alert and answering questions appropriately; able to carry a conversation coherently. She continues to require extra assistance to perform minimal activities.  Objective: Vitals:   05/20/19 2306 05/21/19 0607 05/21/19 2136 05/22/19 0608  BP: 112/79 (!) 117/53 122/82 (!) 112/58  Pulse: 72 80 76 66  Resp: 20 16 18 16   Temp: 98 F (36.7 C) 98.2 F (36.8 C) 98 F (36.7 C) 98.2 F (36.8 C)  TempSrc: Oral     SpO2: 95% 97% 94% 97%  Weight:      Height:        Intake/Output Summary (Last 24 hours) at 05/22/2019 1042 Last data filed at 05/21/2019 2100 Gross per 24 hour  Intake 480 ml  Output 800 ml  Net -320 ml   Filed Weights   05/09/19 1708 05/11/19 0500 05/17/19 0008  Weight: 89.8 kg 95.1 kg 87 kg    Examination: General exam: Alert, awake, oriented x 3. Afebrile, no chest, pain, no nausea, no vomiting. Overall improvement of her mentation. Respiratory system: Clear to auscultation. Respiratory effort  normal. Cardiovascular system: Rate controlled. No murmurs, gallops or rubs.  Gastrointestinal system: Abdomen is nondistended, soft and nontender. No organomegaly or masses felt. Normal bowel sounds heard. Central nervous system: Alert and oriented x3. CN 2-12 intact. No focal neurological deficits. Extremities: No cyanosis or clubbing. +pedal pulses Skin: No rashes or petechiae. Her abdominal wound continues to heal appropriately without signs of superimposed infection.  Psychiatry: Mood & affect appropriate. Expressing intermittent visual hallucinations which she is able to recognize.      Data Reviewed: I have personally reviewed following labs and imaging studies  CBC: Recent Labs  Lab 05/16/19 0537 05/17/19 0613 05/18/19 0451 05/20/19 0502  WBC 9.9 10.8* 9.5 8.1  HGB 9.0* 10.0* 9.5* 10.2*  HCT 30.3* 34.6* 32.8* 36.1  MCV 85.1 87.2 87.9 89.1  PLT 371 421* 402* 623*   Basic Metabolic Panel: Recent Labs  Lab 05/16/19 0537 05/17/19 0613 05/18/19 0451 05/19/19 0500  NA 139 140 138 140  K 3.3* 3.4* 3.0* 3.7  CL 95* 94* 97* 102  CO2 31 33* 29 27  GLUCOSE 93 111* 79 79  BUN 10 9 8 8   CREATININE 0.90 1.03* 0.91 0.85  CALCIUM 8.1* 8.1* 7.6* 7.9*  MG 1.2* 1.5* 1.8 1.7   GFR: Estimated Creatinine Clearance: 67.6 mL/min (by C-G formula based on SCr of 0.85 mg/dL).   Recent Labs  Lab 05/16/19 1135  AMMONIA 16   CBG: Recent Labs  Lab 05/21/19 0726 05/21/19 1107 05/21/19 1703 05/22/19 0001 05/22/19 0607  GLUCAP 187* 150* 154* 162* 172*    Radiology Studies: No results found.  Scheduled Meds: .  apixaban  5 mg Oral BID  . Chlorhexidine Gluconate Cloth  6 each Topical Daily  . diltiazem  120 mg Oral Daily  . feeding supplement (ENSURE ENLIVE)  237 mL Oral BID BM  . ferrous sulfate  325 mg Oral BID WC  . furosemide  20 mg Oral Daily  . insulin aspart  0-7 Units Subcutaneous Q6H  . metoprolol tartrate  50 mg Oral BID  . mupirocin ointment   Nasal BID  .  pantoprazole  40 mg Oral QAC breakfast  . potassium chloride  40 mEq Oral Daily  . sodium chloride flush  10-40 mL Intracatheter Q12H   Continuous Infusions: . ertapenem 1,000 mg (05/21/19 1730)     LOS: 13 days    Time spent: 30 minutes    Barton Dubois, MD [facility] Pager [phone]  05/22/2019, 10:42 AM

## 2019-05-23 LAB — BASIC METABOLIC PANEL
Anion gap: 11 (ref 5–15)
BUN: 13 mg/dL (ref 8–23)
CO2: 28 mmol/L (ref 22–32)
Calcium: 7.9 mg/dL — ABNORMAL LOW (ref 8.9–10.3)
Chloride: 101 mmol/L (ref 98–111)
Creatinine, Ser: 0.89 mg/dL (ref 0.44–1.00)
GFR calc Af Amer: >60 mL/min (ref >60)
GFR calc non Af Amer: >60 mL/min (ref >60)
Glucose, Bld: 157 mg/dL — ABNORMAL HIGH (ref 70–99)
Potassium: 3.8 mmol/L (ref 3.5–5.1)
Sodium: 140 mmol/L (ref 135–145)

## 2019-05-23 LAB — GLUCOSE, CAPILLARY
Glucose-Capillary: 139 mg/dL — ABNORMAL HIGH (ref 70–99)
Glucose-Capillary: 181 mg/dL — ABNORMAL HIGH (ref 70–99)
Glucose-Capillary: 215 mg/dL — ABNORMAL HIGH (ref 70–99)
Glucose-Capillary: 97 mg/dL (ref 70–99)

## 2019-05-23 MED ORDER — PRO-STAT SUGAR FREE PO LIQD
30.0000 mL | Freq: Three times a day (TID) | ORAL | Status: DC
  Administered 2019-05-23 – 2019-05-24 (×4): 30 mL via ORAL
  Filled 2019-05-23 (×4): qty 30

## 2019-05-23 MED ORDER — JUVEN PO PACK
1.0000 | PACK | Freq: Two times a day (BID) | ORAL | Status: DC
  Administered 2019-05-23 – 2019-05-25 (×3): 1 via ORAL
  Filled 2019-05-23 (×3): qty 1

## 2019-05-23 NOTE — Progress Notes (Signed)
PROGRESS NOTE    LYNNSIE LINDERS  KVQ:[id number] DOB: 1951/01/17 DOA: 05/09/2019 PCP: [name], MD   Brief Narrative:  Per HPI: [name] a68 y.o.female,with history of atrial fibrillation on anticoagulation therapy with apixaban, diabetes mellitus type 2, LV thrombus, hypertension, depression, abdominal wound infection,? Pyoderma gangrenosum, currently on IV Invanz. Patient was recently discharged from the hospital on 04/30/2019 when she was admitted with abdominal wound infection and was discharged on Invanz for 21 days. As per patient she also has been taking high-dose prednisone which was prescribed by general surgery on 03/09/2019 after she had debridement of the abdominal wound and biopsy came back? Pyoderma gangrenosum.  As per patient, she noticed that her blood sugar has been very high at home she takes 60 mg of prednisone daily. She was not discharged on prednisone on 04/30/2019 as per discharge summary.  She denies chest pain but complains of palpitations. Admits to having shortness of breath. Complains of nausea but denies vomiting and diarrhea. Denies abdominal pain or dysuria.  In the ED patient started on IV Cardizem infusion for atrial fibrillation with RVR.   Assessment & Plan:   Active Problems:   Atrial fibrillation with RVR (HCC)   Acute encephalopathy-unknown cause -Initially thought that this was related to hospital acquired delirium and patient was given Seroquel, given lack of improvement initially, medication was discontinued. After reviewing med-rec's patient with mid potency dose and also still receiving other psychotropic agents.  -will Continue to hold Wellbutrin and escitalopram for now. -CT head without acute intracranial abnormalities and EEG not demonstrating acute epileptic waveforms.  -Patient expresses that she no longer is having intermittent visual hallucinations.  -Continue Seroquel -Appreciate neurology evaluation.  -Ammonia, TSH, and venous blood gas within normal limits.  -Continue to monitor closely.  Atrial fibrillation with RVR-resolved -Discontinue telemetry as electrolytes have remained stable.  -Continue metoprolol and oral Cardizem. -Follow-up vital signs and heart rate with further adjustments to medication as needed.   -Continue Eliquis for secondary prevention.  -Heart rate is stable and controlled.  Generalized weakness -Patient evaluated by PT and found severely deconditioned with recommendation for skilled nursing facility at time of discharge for rehabilitation.  -Social worker has been made aware for assistance with placement. .   Mild hypokalemia -Continue to follow electrolytes trend and replete as needed.    Chronic abdominal wound with pyoderma gangrenosum -Antibiotic treatment completed on 10/3 -PICC line adjusted 9/27 -Appreciate general surgery recommendations and will continue wound dressings and changes -Currently not on steroid therapy -Will need outpatient follow up with dermatology service.  Type 2 diabetes with labile blood glucose readings-resolved -Continue SSI as needed with Accu-Cheks every QAC-QHS -Continue carb modified diet -Appreciate diabetes coordinator recommendations; A1c is 8.8% -Will need endocrinology outpatient follow-up soon for insulin pump if she is a candidate for it.  Acute on chronic diastolic heart failure -Improved -Continue Lasix 20 mg PO daily -Encourage oral intake and proper hydration. -Follow daily weights -Volume status is compensated at this time.  DVT prophylaxis:  Apixaban Code Status:Full code Family Communication:No family at bedside Disposition Plan:Patients vital signs and blood glucose remain under control. Patient no longer expresses visual hallucinations. Appreciate neurology service recommendations and assistance. Physical therapy has recommended skilled nursing facility; currently waiting for a bed, as she  is medically stable for discharge.   Consultants:  General surgery peripherally following  Neurology   Procedures:  EEG: Negative for acute epileptic waveforms; positive for encephalopathy changes.  CT head: No acute intracranial abnormalities.  Antimicrobials:  Anti-infectives (From admission, onward)   Start     Dose/Rate Route Frequency Ordered Stop   05/14/19 1700  ertapenem (INVANZ) 1,000 mg in sodium chloride 0.9 % 100 mL IVPB  Status:  Discontinued     1 g 200 mL/hr over 30 Minutes Intravenous Every 24 hours 05/14/19 1409 05/22/19 1505   05/10/19 1300  ertapenem (INVANZ) 1,000 mg in sodium chloride 0.9 % 100 mL IVPB  Status:  Discontinued     1 g 200 mL/hr over 30 Minutes Intravenous Every 24 hours 05/09/19 2201 05/14/19 1406      Subjective: No fever, no chest pain, no shortness of breath. She expresses that she had a few more visual hallucinations after yesterday's visit, but denies any throughout the night. Also expressed she had a good night sleep and felt rested. She continues to require extra assistance to perform minimal activities.   Objective: Vitals:   05/22/19 1303 05/22/19 2146 05/23/19 0510 05/23/19 0848  BP: (!) 110/59 118/63 (!) 99/45 (!) 107/51  Pulse: 79 (!) 59 88 76  Resp: 16 16 18 18   Temp: 97.7 F (36.5 C) 98.5 F (36.9 C) 97.9 F (36.6 C)   TempSrc: Oral  Oral   SpO2: 100% 98% 97% 100%  Weight:      Height:        Intake/Output Summary (Last 24 hours) at 05/23/2019 1406 Last data filed at 05/23/2019 0700 Gross per 24 hour  Intake 120 ml  Output 450 ml  Net -330 ml   Filed Weights   05/09/19 1708 05/11/19 0500 05/17/19 0008  Weight: 89.8 kg 95.1 kg 87 kg    Examination: General exam: Alert, awake, oriented x 3. In no acute distress, afebrile, no chest pain, no shortness of breath, no nausea, and no vomiting. Patient continues to improve her mentation day by day.  Respiratory system: Clear to auscultation. Respiratory effort  normal. Cardiovascular system:Rate controlled. No murmurs, gallops or rubs.  Gastrointestinal system: Abdomen is nondistended, soft and nontender. No organomegaly or masses felt. Normal bowel sounds heard. Central nervous system: Alert and oriented. No focal neurological deficits. Extremities: No cyanosis or clubbing. +pedal pulses Skin: No rashes. Patient's abdominal wound continues to heal appropriately without signs of infection.  Psychiatry: Judgement and insight appear normal. Mood & affect appropriate. No longer expressing visual hallucinations.    Data Reviewed: I have personally reviewed following labs and imaging studies  CBC: Recent Labs  Lab 05/17/19 0613 05/18/19 0451 05/20/19 0502  WBC 10.8* 9.5 8.1  HGB 10.0* 9.5* 10.2*  HCT 34.6* 32.8* 36.1  MCV 87.2 87.9 89.1  PLT 421* 402* 030*   Basic Metabolic Panel: Recent Labs  Lab 05/17/19 0613 05/18/19 0451 05/19/19 0500 05/23/19 0655  NA 140 138 140 140  K 3.4* 3.0* 3.7 3.8  CL 94* 97* 102 101  CO2 33* 29 27 28   GLUCOSE 111* 79 79 157*  BUN 9 8 8 13   CREATININE 1.03* 0.91 0.85 0.89  CALCIUM 8.1* 7.6* 7.9* 7.9*  MG 1.5* 1.8 1.7  --    GFR: Estimated Creatinine Clearance: 64.6 mL/min (by C-G formula based on SCr of 0.89 mg/dL).   No results for input(s): AMMONIA in the last 168 hours. CBG: Recent Labs  Lab 05/22/19 1116 05/22/19 1606 05/23/19 0008 05/23/19 0625 05/23/19 1232  GLUCAP 214* 155* 181* 139* 215*    Radiology Studies: No results found.  Scheduled Meds: . apixaban  5 mg Oral BID  . Chlorhexidine Gluconate Cloth  6 each Topical Daily  . diltiazem  120 mg Oral Daily  . feeding supplement (ENSURE ENLIVE)  237 mL Oral BID BM  . ferrous sulfate  325 mg Oral BID WC  . furosemide  20 mg Oral Daily  . insulin aspart  0-7 Units Subcutaneous Q6H  . metoprolol tartrate  50 mg Oral BID  . mupirocin ointment   Nasal BID  . pantoprazole  40 mg Oral QAC breakfast  . potassium chloride  40 mEq Oral  Daily  . QUEtiapine  12.5 mg Oral QHS  . sodium chloride flush  10-40 mL Intracatheter Q12H   Continuous Infusions:    LOS: 14 days    Time spent: 30 minutes    Barton Dubois, MD [facility] Pager [phone]  05/23/2019, 2:06 PM

## 2019-05-23 NOTE — Progress Notes (Signed)
Nutrition Follow-up  DOCUMENTATION CODES:   Obesity unspecified  INTERVENTION:  -d/c Ensure Enlive po BID, each supplement provides 350 kcal and 20 grams of protein   --1 packet Juven BID, each packet provides 95 calories, 2.5 grams of protein (collagen), and 9.8 grams of carbohydrate (3 grams sugar). To support wound healing.   -ProStat 30 ml TID (each 30 ml provides 100 kcal, 15 gr protein)  due to poor meal intake  NUTRITION DIAGNOSIS:   Increased nutrient needs related to wound healing(chronic abdominal wound) as evidenced by estimated needs.  Ongoing  GOAL:   Patient will meet greater than or equal to 90% of their needs  NOT met-meal intake 0-50% most days  MONITOR:   PO intake, Labs, I & O's, Weight trends, Supplement acceptance  REASON FOR ASSESSMENT:   Malnutrition Screening Tool    ASSESSMENT:   68 year old female with medical history significant of HTN, T2DM, CHF, A fib, COPD, depression, recent 7/20 admission for chronic abdominal wound s/p wound debridement; 9/12 admission with pyoderna grangrenosum, currently on IV Invanz. Patient presented to ED with complaints of high blood sugar, SOB, nausea and poor oral intake.   Meal intake continues to be poor-documented at 0-50% of all but one meal. Patient is not happy with her meals and complains that at times meals are not as she expected. She has an Ensure here but doesn't like them. Prefers juice based supplement.   Medications reviewed and include: Iron, lasix, Klor-con, SSI, lopressor, Seroquel.   Last wt obtained on 9/29-shows loss of 2.8 kg 3.2% loss in 8 days which is significant. Patient is on a diuretic which likely contributed to decrease but also her intake has been poor (meeting </=50% of needs for >/= 5 days). She is high risk for malnutrition.   Labs and weights reviewed: BMP Latest Ref Rng & Units 05/23/2019 05/19/2019 05/18/2019  Glucose 70 - 99 mg/dL 157(H) 79 79  BUN 8 - 23 mg/dL _0 Creatinine 0.44 - 1.00 mg/dL 0.89 0.85 0.91  Sodium 135 - 145 mmol/L 140 140 138  Potassium 3.5 - 5.1 mmol/L 3.8 3.7 3.0(L)  Chloride 98 - 111 mmol/L 101 102 97(L)  CO2 22 - 32 mmol/L _1 Calcium 8.9 - 10.3 mg/dL 7.9(L) 7.9(L) 7.6(L)     Diet Order:   Diet Order            Diet heart healthy/carb modified Room service appropriate? Yes; Fluid consistency: Thin  Diet effective now              EDUCATION NEEDS:   No education needs have been identified at this time  Skin:  Skin Assessment: Reviewed RN Assessment  Last BM:  9/21  Height:   Ht Readings from Last 1 Encounters:  05/09/19 _2  (1.626 m)    Weight:   Wt Readings from Last 1 Encounters:  05/17/19 87 kg  9/21-89.8 kg  Ideal Body Weight:  54.5 kg  BMI:  Body mass index is 32.92 kg/m.  Estimated Nutritional Needs:   Kcal:  1850-2000  Protein:  87-98 (1.6-1.8 g/kg/IBW)  Fluid:  < 2 liters   Colman Cater MS,RD,CSG,LDN Office: [phone] Pager: [phone]

## 2019-05-24 LAB — GLUCOSE, CAPILLARY
Glucose-Capillary: 114 mg/dL — ABNORMAL HIGH (ref 70–99)
Glucose-Capillary: 166 mg/dL — ABNORMAL HIGH (ref 70–99)
Glucose-Capillary: 232 mg/dL — ABNORMAL HIGH (ref 70–99)
Glucose-Capillary: 271 mg/dL — ABNORMAL HIGH (ref 70–99)

## 2019-05-24 NOTE — Progress Notes (Signed)
PROGRESS NOTE    [name]  IHW:[id number] DOB: 1950/10/28 DOA: 05/09/2019 PCP: [name], MD   Brief Narrative:  Per HPI: [name] a68 y.o.female,with history of atrial fibrillation on anticoagulation therapy with apixaban, diabetes mellitus type 2, LV thrombus, hypertension, depression, abdominal wound infection,? Pyoderma gangrenosum, currently on IV Invanz. Patient was recently discharged from the hospital on 04/30/2019 when she was admitted with abdominal wound infection and was discharged on Invanz for 21 days. As per patient she also has been taking high-dose prednisone which was prescribed by general surgery on 03/09/2019 after she had debridement of the abdominal wound and biopsy came back? Pyoderma gangrenosum.  As per patient, she noticed that her blood sugar has been very high at home she takes 60 mg of prednisone daily. She was not discharged on prednisone on 04/30/2019 as per discharge summary.  She denies chest pain but complains of palpitations. Admits to having shortness of breath. Complains of nausea but denies vomiting and diarrhea. Denies abdominal pain or dysuria.  In the ED patient started on IV Cardizem infusion for atrial fibrillation with RVR.   Assessment & Plan:   Active Problems:   Atrial fibrillation with RVR (HCC)   Acute encephalopathy-unknown cause -Initially thought that this was related to hospital acquired delirium and patient was given Seroquel, given lack of improvement initially, medication was discontinued. After reviewing med-rec's patient with mid potency dose and also still receiving other psychotropic agents.  -Continue to hold Wellbutrin and escitalopram for now. -CT head without acute intracranial abnormalities and EEG not demonstrating acute epileptic waveforms.  -Patient expresses that she no longer is having intermittent visual hallucinations.  -Continue Seroquel 12.40m daily at night time -Appreciate  neurology evaluation. -Ammonia, TSH, and venous blood gas within normal limits.  -Continue to monitor closely  Atrial fibrillation with RVR-resolved -Telemetry discontinued as electrolytes have remained stable.  -Continue metoprolol and oral Cardizem. -Follow-up vital signs and heart rate with further adjustments to medication as needed.   -Continue Eliquis for secondary prevention.   -Heart rate is stable and controlled.   Generalized weakness -Patient evaluated by PT and found severely deconditioned with recommendation for skilled nursing facility at time of discharge for rehabilitation.  -Social worker has been made aware for assistance with placement.    Mild hypokalemia -Continue to follow electrolytes trend and replete as needed.     Chronic abdominal wound with pyoderma gangrenosum -Antibiotic treatment completed on 10/3 -PICC line adjusted 9/27 -Appreciate general surgery recommendations and will continue wound dressings and changes -Currently not on steroid therapy -Will need outpatient follow up with dermatology service  Type 2 diabetes with labile blood glucose readings-resolved -Continue SSI as needed with Accu-Cheks every QAC-QHS -Continue carbohydrate modified diet.  -Appreciate diabetes coordinator recommendations; A1c 8.8% -Will need endocrinology outpatient follow-up soon for insulin pump if she is a candidate for it.  Acute on chronic diastolic heart failure -Improved -Continue Lasix 20 mg PO daily -Encourage oral intake and proper hydration. -Follow daily weights -Volume status is compensated at this time   DVT prophylaxis:  Apixaban Code Status:Full code Family Communication:No family at bedside Disposition Plan:Patients vital signs and blood glucose remain under control. Patient expresses that she had a good night sleep. Appreciate neurology service recommendations and assistance. Physical therapy has recommended skilled nursing facility;  currently waiting for approval to move to skilled nursing facility.    Consultants:  General surgery peripherally following  Neurology   Procedures:  EEG: Negative for acute epileptic waveforms; positive for  encephalopathy changes.  CT head: No acute intracranial abnormalities.   Antimicrobials:  Anti-infectives (From admission, onward)   Start     Dose/Rate Route Frequency Ordered Stop   05/14/19 1700  ertapenem (INVANZ) 1,000 mg in sodium chloride 0.9 % 100 mL IVPB  Status:  Discontinued     1 g 200 mL/hr over 30 Minutes Intravenous Every 24 hours 05/14/19 1409 05/22/19 1505   05/10/19 1300  ertapenem (INVANZ) 1,000 mg in sodium chloride 0.9 % 100 mL IVPB  Status:  Discontinued     1 g 200 mL/hr over 30 Minutes Intravenous Every 24 hours 05/09/19 2201 05/14/19 1406      Subjective: No fever, no chest pain, no shortness of breath, no hallucinations. She expresses that she had a good night sleep and felt rested. She continues to require extra assistance to perform minimal activities.  Objective: Vitals:   05/23/19 0848 05/23/19 1437 05/23/19 2200 05/24/19 0504  BP: (!) 107/51 115/64 101/76 101/68  Pulse: 76 81 60 70  Resp: 18 18 16 18   Temp:  97.7 F (36.5 C) 97.8 F (36.6 C) 98.3 F (36.8 C)  TempSrc:  Axillary Oral Oral  SpO2: 100% 99% 99% 100%  Weight:      Height:        Intake/Output Summary (Last 24 hours) at 05/24/2019 1050 Last data filed at 05/24/2019 0900 Gross per 24 hour  Intake 250 ml  Output 601 ml  Net -351 ml   Filed Weights   05/09/19 1708 05/11/19 0500 05/17/19 0008  Weight: 89.8 kg 95.1 kg 87 kg    Examination: General exam: Alert, awake, oriented x 3. In no acute distress, afebrile, no chest pain, no shortness of breath, no nausea, and no vomiting. Patient was carrying a conversation coherently with significant improvement from admission on her mentation. Respiratory system: Clear to auscultation. Respiratory effort normal.  Cardiovascular system: Rate controlled. No murmurs, rubs, gallops. Gastrointestinal system: Abdomen is nondistended, soft and nontender. No organomegaly or masses felt. Normal bowel sounds heard. Central nervous system: Alert and oriented. No focal neurological deficits. Extremities: No cyanosis or clubbing, +pedal pulses Skin: No rashes. Patient's abdominal wound continues to heal appropriately without signs of superimposed infection.  Psychiatry: Judgement and insight appear normal. Mood & affect appropriate.     Data Reviewed: I have personally reviewed following labs and imaging studies  CBC: Recent Labs  Lab 05/18/19 0451 05/20/19 0502  WBC 9.5 8.1  HGB 9.5* 10.2*  HCT 32.8* 36.1  MCV 87.9 89.1  PLT 402* 702*   Basic Metabolic Panel: Recent Labs  Lab 05/18/19 0451 05/19/19 0500 05/23/19 0655  NA 138 140 140  K 3.0* 3.7 3.8  CL 97* 102 101  CO2 29 27 28   GLUCOSE 79 79 157*  BUN 8 8 13   CREATININE 0.91 0.85 0.89  CALCIUM 7.6* 7.9* 7.9*  MG 1.8 1.7  --    GFR: Estimated Creatinine Clearance: 64.6 mL/min (by C-G formula based on SCr of 0.89 mg/dL).   No results for input(s): AMMONIA in the last 168 hours. CBG: Recent Labs  Lab 05/23/19 0625 05/23/19 1232 05/23/19 1655 05/24/19 0002 05/24/19 0611  GLUCAP 139* 215* 97 271* 166*    Radiology Studies: No results found.  Scheduled Meds: . apixaban  5 mg Oral BID  . Chlorhexidine Gluconate Cloth  6 each Topical Daily  . diltiazem  120 mg Oral Daily  . feeding supplement (PRO-STAT SUGAR FREE 64)  30 mL Oral TID  .  ferrous sulfate  325 mg Oral BID WC  . furosemide  20 mg Oral Daily  . insulin aspart  0-7 Units Subcutaneous Q6H  . metoprolol tartrate  50 mg Oral BID  . mupirocin ointment   Nasal BID  . nutrition supplement (JUVEN)  1 packet Oral BID BM  . pantoprazole  40 mg Oral QAC breakfast  . potassium chloride  40 mEq Oral Daily  . QUEtiapine  12.5 mg Oral QHS  . sodium chloride flush  10-40 mL  Intracatheter Q12H   Continuous Infusions:    LOS: 15 days    Time spent: 30 minutes    Barton Dubois, MD [facility] Pager [phone]  05/24/2019, 10:50 AM

## 2019-05-25 ENCOUNTER — Inpatient Hospital Stay
Admission: RE | Admit: 2019-05-25 | Discharge: 2019-06-08 | Disposition: A | Discharge status: Skilled Nursing Facility | Payer: BC Managed Care – PPO | Source: Ambulatory Visit | Attending: Internal Medicine | Admitting: Internal Medicine

## 2019-05-25 DIAGNOSIS — N390 Urinary tract infection, site not specified: Secondary | ICD-10-CM | POA: Diagnosis not present

## 2019-05-25 DIAGNOSIS — E1165 Type 2 diabetes mellitus with hyperglycemia: Secondary | ICD-10-CM | POA: Diagnosis not present

## 2019-05-25 DIAGNOSIS — I5033 Acute on chronic diastolic (congestive) heart failure: Secondary | ICD-10-CM | POA: Diagnosis not present

## 2019-05-25 DIAGNOSIS — I4891 Unspecified atrial fibrillation: Secondary | ICD-10-CM | POA: Diagnosis not present

## 2019-05-25 DIAGNOSIS — E11649 Type 2 diabetes mellitus with hypoglycemia without coma: Secondary | ICD-10-CM | POA: Diagnosis not present

## 2019-05-25 DIAGNOSIS — S31109D Unspecified open wound of abdominal wall, unspecified quadrant without penetration into peritoneal cavity, subsequent encounter: Secondary | ICD-10-CM | POA: Diagnosis not present

## 2019-05-25 DIAGNOSIS — I5030 Unspecified diastolic (congestive) heart failure: Secondary | ICD-10-CM | POA: Diagnosis not present

## 2019-05-25 DIAGNOSIS — E1159 Type 2 diabetes mellitus with other circulatory complications: Secondary | ICD-10-CM | POA: Diagnosis not present

## 2019-05-25 DIAGNOSIS — I130 Hypertensive heart and chronic kidney disease with heart failure and stage 1 through stage 4 chronic kidney disease, or unspecified chronic kidney disease: Secondary | ICD-10-CM | POA: Diagnosis not present

## 2019-05-25 DIAGNOSIS — M6281 Muscle weakness (generalized): Secondary | ICD-10-CM | POA: Diagnosis not present

## 2019-05-25 DIAGNOSIS — G9340 Encephalopathy, unspecified: Secondary | ICD-10-CM | POA: Diagnosis not present

## 2019-05-25 DIAGNOSIS — N179 Acute kidney failure, unspecified: Secondary | ICD-10-CM | POA: Diagnosis not present

## 2019-05-25 DIAGNOSIS — R1114 Bilious vomiting: Secondary | ICD-10-CM | POA: Diagnosis not present

## 2019-05-25 DIAGNOSIS — D8989 Other specified disorders involving the immune mechanism, not elsewhere classified: Secondary | ICD-10-CM | POA: Diagnosis not present

## 2019-05-25 DIAGNOSIS — J449 Chronic obstructive pulmonary disease, unspecified: Secondary | ICD-10-CM | POA: Diagnosis not present

## 2019-05-25 DIAGNOSIS — E875 Hyperkalemia: Secondary | ICD-10-CM | POA: Diagnosis not present

## 2019-05-25 DIAGNOSIS — L98492 Non-pressure chronic ulcer of skin of other sites with fat layer exposed: Secondary | ICD-10-CM | POA: Diagnosis not present

## 2019-05-25 DIAGNOSIS — I10 Essential (primary) hypertension: Secondary | ICD-10-CM | POA: Diagnosis not present

## 2019-05-25 DIAGNOSIS — N183 Chronic kidney disease, stage 3 unspecified: Secondary | ICD-10-CM | POA: Diagnosis not present

## 2019-05-25 DIAGNOSIS — M810 Age-related osteoporosis without current pathological fracture: Secondary | ICD-10-CM | POA: Diagnosis not present

## 2019-05-25 DIAGNOSIS — F329 Major depressive disorder, single episode, unspecified: Secondary | ICD-10-CM | POA: Diagnosis not present

## 2019-05-25 DIAGNOSIS — L88 Pyoderma gangrenosum: Secondary | ICD-10-CM | POA: Diagnosis not present

## 2019-05-25 DIAGNOSIS — I513 Intracardiac thrombosis, not elsewhere classified: Secondary | ICD-10-CM | POA: Diagnosis not present

## 2019-05-25 DIAGNOSIS — E1122 Type 2 diabetes mellitus with diabetic chronic kidney disease: Secondary | ICD-10-CM | POA: Diagnosis not present

## 2019-05-25 DIAGNOSIS — R1310 Dysphagia, unspecified: Secondary | ICD-10-CM | POA: Diagnosis not present

## 2019-05-25 LAB — GLUCOSE, CAPILLARY
Glucose-Capillary: 159 mg/dL — ABNORMAL HIGH (ref 70–99)
Glucose-Capillary: 168 mg/dL — ABNORMAL HIGH (ref 70–99)
Glucose-Capillary: 259 mg/dL — ABNORMAL HIGH (ref 70–99)

## 2019-05-25 MED ORDER — FUROSEMIDE 20 MG PO TABS: 20.0000 mg | ORAL_TABLET | Freq: Every day | ORAL | Status: DC

## 2019-05-25 MED ORDER — POTASSIUM CHLORIDE CRYS ER 20 MEQ PO TBCR: 40.0000 meq | EXTENDED_RELEASE_TABLET | Freq: Every day | ORAL | Status: DC

## 2019-05-25 MED ORDER — OXYCODONE HCL 5 MG PO TABS: 5.0000 mg | ORAL_TABLET | Freq: Three times a day (TID) | ORAL | 0 refills | Status: DC | PRN

## 2019-05-25 MED ORDER — JUVEN PO PACK: 1.0000 | PACK | Freq: Two times a day (BID) | ORAL | 0 refills | Status: DC

## 2019-05-25 MED ORDER — PRO-STAT SUGAR FREE PO LIQD: 30.0000 mL | Freq: Three times a day (TID) | ORAL | 0 refills | Status: AC

## 2019-05-25 MED ORDER — INSULIN ASPART 100 UNIT/ML ~~LOC~~ SOLN: 0.0000 [IU] | Freq: Three times a day (TID) | SUBCUTANEOUS | 11 refills | Status: DC

## 2019-05-25 MED ORDER — QUETIAPINE FUMARATE 25 MG PO TABS: 12.5000 mg | ORAL_TABLET | Freq: Every day | ORAL | Status: DC

## 2019-05-25 NOTE — Progress Notes (Signed)
Report called and given to nursing staff at [facility].

## 2019-05-25 NOTE — Discharge Summary (Signed)
Physician Discharge Summary  [name] QZR:[id number] DOB: [birth year] DOA: 05/09/2019  PCP: Katrina Cha, MD  Admit date: 05/09/2019 Discharge date: 05/25/2019  Admitted From: Home Disposition:  SNF  Recommendations for Outpatient Follow-up:  1. Follow up with PCP in 1-2 weeks 2. Please obtain BMP/CBC in one week 3. Please make referral for patient to see dermatology for pyoderma gangrenosum from abd wound    Discharge Condition: Stable CODE STATUS: FULL Diet recommendation: Heart Healthy / Carb Modified   Brief/Interim Summary: [name] a68 y.o.female,with history of atrial fibrillation on anticoagulation therapy with apixaban, diabetes mellitus type 2, LV thrombus, hypertension, depression, abdominal wound infection,? Pyoderma gangrenosum, currently on IV Invanz. Patient was recently discharged from the hospital on 04/30/2019 when she was admitted with abdominal wound infection and was discharged on Invanz for 21 days. As per patient she also has been taking high-dose prednisone which was prescribed by general surgery on 03/09/2019 after she had debridement of the abdominal wound and biopsy came back? Pyoderma gangrenosum.  As per patient, she noticed that her blood sugar has been very high at home she takes 60 mg of prednisone daily. She was not discharged on prednisone on 04/30/2019 as per discharge summary.  She denies chest pain but complains of palpitations. Admits to having shortness of breath. Complains of nausea but denies vomiting and diarrhea. Denies abdominal pain or dysuria.  In the ED patient started on IV Cardizem infusion for atrial fibrillation with RVR.  9/22: Patient noted to have significant hypoglycemia. She is having very poor oral intake overall. Heart rate appears to be better controlled.  9/23: Hypoglycemia has improved as well as heart rate. She still is on minimal dose of IV Cardizem which will not be discontinued and  hopefully, patient can be transferred to telemetry floor today. She is noted to have some mild delirium as well.  9/24: Patient continued to have episodes of hypoglycemia overnight and required D10 infusion temporarily. She continues to eat well and has less delirium today. Potassium and magnesium still a little bit on the low side.  9/25:Patient appears to be stabilizing from a blood glucose standpoint on sliding scale, but will monitor 24 more hours to ensure stability prior to discharge. She also appears to have less delirium and potassium is improved.  9/26:Patient appears to be stable from a blood glucose standpoint and her heart rate is controlled. Unfortunately, she is having some persistent confusion and delirium. Her PICC line is also nonfunctioning at this point and will require an exchange prior to going back home.  9/27: Patient was given Seroquel overnight, but is still confused and disoriented. Heart rate and blood glucose is well controlled at this point. PICC line will require repositioning by [facility] today. We will plan to diurese further and increased dose of Seroquel tonight.  9/28: Patient has worsening confusion and persistent encephalopathy after being given full dose of Seroquel last night. We will plan to hold this at the moment. Will do other work-up and continue monitoring. PT evaluation with possible need for SNF on discharge.  9/29: Patient continues have persistent encephalopathic state.  She is very poorly responsive and appears to have brief episodes of psychomotor agitation interrupted by somnolence.  We will continue current antibiotic treatment with Invanz to continue through 10/3.  Neurology eval-->Altered mental status due to toxic metabolic encephalopathy/delirium in the setting of acute medical illness.  No additional neurological work-up is recommended.  EEG--suggestive of moderate diffuse encephalopathy, non specific to etiology  Over the  following week, the patient's mental status continued to improve and the patient was conversant and appropriate.  She remained stable off abx and abd wound continued to improve off abx.  She remained deconditioned.  It   Discharge Diagnoses:  Acute encephalopathy-unknown cause -Initially thought that this was related to delirium and patient was given Seroquel  -likely due to polypharmacy -Discontinue Wellbutrin and escitalopram  -discontinue oxycodone as pt has not received any doses x 72 hours -CT head and EEG ordered -Appreciate neurology evaluation -Ammonia, TSH--within normal limits -VBG--no hypercarbia -UA showed 11-20 WBC but no culture done--pt finished 3 weeks ertapenem for abd wound -Continue to monitor closely -PT evaluation with need for SNF  Atrial fibrillation with RVR-resolved -Monitor on telemetry -Restarted home oral Cardizem and metoprolol -Continue Eliquis for anticoagulation -Continue metoprolol p.o. 50 mg twice daily  Mild hypokalemia -Potassiumrepletion   Chronic abdominal wound with pyoderma gangrenosum -Finished IV Invanz (3 weeks) on10/3 -PICC lineadjusted 9/27 -Appreciate general surgery recommendations and will continue wound dressings and changes -Currently not on steroid therapy -Will need outpatient follow up with dermatology service  Type 2 diabetes with labile blood glucose readings-resolved -Continue on SSI as needed with Accu-Cheks every6hours -Continue carb modified diet -Appreciate diabetes coordinator recommendations;A1c noted to be 8.8% -will need endocrinology outpatient follow-up soon for insulin pump likely -Discussed case with diabetes coordinator and will plan to discharge on sliding scale insulin as prescribed with NovoLog pen in a.m. if she remains stable. This will be the safest plan for discharge and will hold further Levemir until seen by endocrinology in the near future.  Acute on chronic diastolic heart  failure-improved -initially on IV lasix>>>po  Discharge Instructions   Allergies as of 05/25/2019      Reactions   Feraheme [ferumoxytol] Other (See Comments)   Back pain (yelling out with back pain)   Propranolol Swelling   Pt states it may have been leg swelling   Topamax [topiramate] Other (See Comments)   hallucinations   Latex Itching, Rash      Medication List    STOP taking these medications   acetaminophen 500 MG tablet Commonly known as: TYLENOL   buPROPion 300 MG 24 hr tablet Commonly known as: WELLBUTRIN XL   ertapenem  IVPB Commonly known as: INVANZ   escitalopram 10 MG tablet Commonly known as: LEXAPRO   Levemir FlexTouch 100 UNIT/ML Pen Generic drug: Insulin Detemir   ondansetron 4 MG tablet Commonly known as: ZOFRAN   oxyCODONE 5 MG immediate release tablet Commonly known as: Oxy IR/ROXICODONE     TAKE these medications   albuterol 108 (90 Base) MCG/ACT inhaler Commonly known as: VENTOLIN HFA Inhale 2 puffs into the lungs every 6 (six) hours as needed for wheezing or shortness of breath. What changed: reasons to take this   apixaban 5 MG Tabs tablet Commonly known as: ELIQUIS Take 1 tablet (5 mg total) by mouth 2 (two) times daily.   diltiazem 120 MG 24 hr capsule Commonly known as: CARDIZEM CD Take 1 capsule (120 mg total) by mouth daily. What changed: additional instructions   feeding supplement (PRO-STAT SUGAR FREE 64) Liqd Take 30 mLs by mouth 3 (three) times daily.   ferrous sulfate 325 (65 FE) MG tablet Commonly known as: FerrouSul Take 1 tablet (325 mg total) by mouth 2 (two) times daily with a meal.   fluticasone 50 MCG/ACT nasal spray Commonly known as: FLONASE Place 2 sprays into both nostrils daily as needed for allergies or rhinitis (congestion).  furosemide 20 MG tablet Commonly known as: LASIX Take 1 tablet (20 mg total) by mouth daily. Start taking on: May 26, 2019 What changed:   how much to take  when to  take this   insulin aspart 100 UNIT/ML injection Commonly known as: novoLOG Inject 0-7 Units into the skin 4 (four) times daily -  before meals and at bedtime. CBG 121-150--no units;  151-200--1 unit;  201-250--3 units;  251-300--4 units;  301-350--5 units; 351-400--7 units   levalbuterol 0.63 MG/3ML nebulizer solution Commonly known as: XOPENEX Take 0.63 mg by nebulization every 6 (six) hours as needed for wheezing or shortness of breath.   metoprolol tartrate 50 MG tablet Commonly known as: LOPRESSOR Take 50 mg by mouth 2 (two) times daily.   nutrition supplement (JUVEN) Pack Take 1 packet by mouth 2 (two) times daily between meals.   nystatin powder Commonly known as: MYCOSTATIN/NYSTOP Apply topically daily as needed (rash in skin folds).   OVER THE COUNTER MEDICATION Apply 1 application topically daily as needed (rash in skin folds). Baby butt paste   pantoprazole 40 MG tablet Commonly known as: PROTONIX Take 1 tablet (40 mg total) by mouth daily before breakfast. What changed:   when to take this  reasons to take this   potassium chloride SA 20 MEQ tablet Commonly known as: KLOR-CON Take 2 tablets (40 mEq total) by mouth daily. Start taking on: May 26, 2019   QUEtiapine 25 MG tablet Commonly known as: SEROQUEL Take 0.5 tablets (12.5 mg total) by mouth at bedtime.   Spiriva Respimat 1.25 MCG/ACT Aers Generic drug: Tiotropium Bromide Monohydrate Inhale 1 puff into the lungs daily.      Contact information for after-discharge care    [facility] .   Service: Skilled Nursing Contact information: [address] [place] [address] [phone]             Allergies  Allergen Reactions   Feraheme [Ferumoxytol] Other (See Comments)    Back pain (yelling out with back pain)   Propranolol Swelling    Pt states it may have been leg swelling   Topamax [Topiramate] Other (See Comments)     hallucinations   Latex Itching and Rash    Consultations:  General surgery   Procedures/Studies: Ct Head Wo Contrast  Result Date: 05/17/2019 CLINICAL DATA:  Altered mental status EXAM: CT HEAD WITHOUT CONTRAST TECHNIQUE: Contiguous axial images were obtained from the base of the skull through the vertex without intravenous contrast. COMPARISON:  None. FINDINGS: Brain: Examination is significantly limited by motion artifact throughout. No evidence of acute infarction, hemorrhage, hydrocephalus, extra-axial collection or mass lesion/mass effect. Mild periventricular white matter hypodensity. Vascular: No hyperdense vessel or unexpected calcification. Skull: Normal. Negative for fracture or focal lesion. Sinuses/Orbits: No acute finding. Other: None. IMPRESSION: Examination is significantly limited by motion artifact throughout. Within this limitation, no acute intracranial pathology. Small-vessel white matter disease. Electronically Signed   By: Eddie Candle M.D.   On: 05/17/2019 11:48   Dg Chest Port 1 View  Addendum Date: 05/15/2019   ADDENDUM REPORT: 05/15/2019 11:10 ADDENDUM: The technologist reversed the image and incorrectly labeled the laterality on the image obtained at 1025 hours. A repeat x-ray was obtained at 1051 hours and appropriately labeled. With this new information, it is clear that the patient has a left PICC line, not a right PICC line. The left PICC line tip overlies the right cardiomediastinal silhouette, superimposed on  the region of the distal SVC near the junction with the RA. Cardiopericardial silhouette is stable in size. Pulmonary edema pattern again noted. Bones are diffusely demineralized with chronic posttraumatic deformity in the proximal left humerus. IMPRESSION: Left PICC line tip is superimposed on the expected location of the distal SVC. Electronically Signed   By: Misty Stanley M.D.   On: 05/15/2019 11:10   Result Date: 05/15/2019 CLINICAL DATA:  PICC line  placement EXAM: PORTABLE CHEST 1 VIEW COMPARISON:  05/14/2019 FINDINGS: 1025 hours. Patient is markedly rotated to the left. The left PICC line seen previously is no longer evident and a right PICC line is now visible. The tip of the right PICC line projects over the left cardiomediastinal shadow on this exam. Given the marked rotation, tip position cannot be localized. Cardiopericardial silhouette is enlarged. Similar pulmonary edema pattern. No substantial pleural effusion. IMPRESSION: Right PICC line is new in the interval. Tip position cannot be adequately assessed given the marked leftward rotation. The tip of the PICC line projects too far to the left but given the presumed anterior positioning of the line, this may simply be related to the rotation. Repeat chest x-ray recommended with careful attention to AP positioning to better assess PICC line location. Electronically Signed: By: Misty Stanley M.D. On: 05/15/2019 10:49   Dg Chest Port 1 View  Result Date: 05/14/2019 CLINICAL DATA:  Evaluate PICC line EXAM: PORTABLE CHEST 1 VIEW COMPARISON:  May 10, 2019 FINDINGS: A left PICC line is been placed in the interval. It is difficult to see the distal if the believe it terminates in the right atrium 4.5 cm below the caval atrial junction. Stable cardiomediastinal silhouette. No pneumothorax. Mild interstitial prominence centrally. IMPRESSION: 1. The left PICC line appears to terminate in the right atrium, 4.5 cm below the caval atrial junction. Recommend repositioning. 2. Suggested mild pulmonary venous congestion. These results will be called to the ordering clinician or representative by the Radiologist Assistant, and communication documented in the PACS or zVision Dashboard. Electronically Signed   By: Dorise Bullion III M.D   On: 05/14/2019 18:57   Dg Chest Port 1 View  Result Date: 05/10/2019 CLINICAL DATA:  PICC placement reassessment. EXAM: PORTABLE CHEST 1 VIEW COMPARISON:  05/09/2019.  FINDINGS: PICC line noted with tip over superior vena cava on today's exam. This is in good anatomic position. Stable cardiomegaly. Low lung volumes with mild bibasilar atelectasis. IMPRESSION: 1. PICC line now in noted with tip projected over superior vena cava in good anatomic position. 2.  Stable cardiomegaly. 3.  Low lung volumes.  Mild bibasilar atelectasis. Electronically Signed   By: Marcello Moores  Register   On: 05/10/2019 09:52   Dg Chest Portable 1 View  Result Date: 05/09/2019 CLINICAL DATA:  Nausea and headache. EXAM: PORTABLE CHEST 1 VIEW COMPARISON:  Single-view of the chest 04/29/2019 and 02/17/2019. CT chest 04/22/2019. FINDINGS: The lungs are clear. Heart size is upper normal. No pneumothorax or pleural fluid. No acute or focal bony abnormality. Right PICC is backed out with its tip now projecting in the right axilla. IMPRESSION: No acute disease. The patient's right PICC is backed out with its tip now projecting in the right axilla. Electronically Signed   By: Inge Rise M.D.   On: 05/09/2019 18:44   Dg Chest Port 1 View  Result Date: 04/29/2019 CLINICAL DATA:  Post PICC line placement EXAM: PORTABLE CHEST 1 VIEW COMPARISON:  Portable exam 1739 hours compared to 02/17/2019 FINDINGS: RIGHT arm PICC line  tip projects over SVC. Enlargement of cardiac silhouette with vascular congestion. Lordotic positioning with slight rotation to the RIGHT. Slight asymmetric opacity of the LEFT hemithorax versus RIGHT may be related to rotation. No definite infiltrate, pleural effusion or pneumothorax. IMPRESSION: Enlargement of cardiac silhouette. Tip of RIGHT arm PICC line projects over SVC. Electronically Signed   By: Lavonia Dana M.D.   On: 04/29/2019 18:04   [place] Ekg Site Rite  Result Date: 04/29/2019 If Site Rite image not attached, placement could not be confirmed due to current cardiac rhythm.       Discharge Exam: Vitals:   05/24/19 2130 05/25/19 0457  BP: (!) 139/56 107/74  Pulse: 98 74    Resp: 16 18  Temp: 97.8 F (36.6 C) 97.8 F (36.6 C)  SpO2: 96% 97%   Vitals:   05/24/19 0504 05/24/19 1900 05/24/19 2130 05/25/19 0457  BP: 101/68  (!) 139/56 107/74  Pulse: 70  98 74  Resp: 18  16 18   Temp: 98.3 F (36.8 C)  97.8 F (36.6 C) 97.8 F (36.6 C)  TempSrc: Oral  Oral Oral  SpO2: 100% 100% 96% 97%  Weight:      Height:        General: Pt is alert, awake, not in acute distress Cardiovascular: RRR, S1/S2 +, no rubs, no gallops Respiratory: CTA bilaterally, no wheezing, no rhonchi Abdominal: Soft, NT, ND, bowel sounds + Extremities: trace nonpitting edema, no cyanosis   The results of significant diagnostics from this hospitalization (including imaging, microbiology, ancillary and laboratory) are listed below for reference.    Significant Diagnostic Studies: Ct Head Wo Contrast  Result Date: 05/17/2019 CLINICAL DATA:  Altered mental status EXAM: CT HEAD WITHOUT CONTRAST TECHNIQUE: Contiguous axial images were obtained from the base of the skull through the vertex without intravenous contrast. COMPARISON:  None. FINDINGS: Brain: Examination is significantly limited by motion artifact throughout. No evidence of acute infarction, hemorrhage, hydrocephalus, extra-axial collection or mass lesion/mass effect. Mild periventricular white matter hypodensity. Vascular: No hyperdense vessel or unexpected calcification. Skull: Normal. Negative for fracture or focal lesion. Sinuses/Orbits: No acute finding. Other: None. IMPRESSION: Examination is significantly limited by motion artifact throughout. Within this limitation, no acute intracranial pathology. Small-vessel white matter disease. Electronically Signed   By: Eddie Candle M.D.   On: 05/17/2019 11:48   Dg Chest Port 1 View  Addendum Date: 05/15/2019   ADDENDUM REPORT: 05/15/2019 11:10 ADDENDUM: The technologist reversed the image and incorrectly labeled the laterality on the image obtained at 1025 hours. A repeat x-ray was  obtained at 1051 hours and appropriately labeled. With this new information, it is clear that the patient has a left PICC line, not a right PICC line. The left PICC line tip overlies the right cardiomediastinal silhouette, superimposed on the region of the distal SVC near the junction with the RA. Cardiopericardial silhouette is stable in size. Pulmonary edema pattern again noted. Bones are diffusely demineralized with chronic posttraumatic deformity in the proximal left humerus. IMPRESSION: Left PICC line tip is superimposed on the expected location of the distal SVC. Electronically Signed   By: Misty Stanley M.D.   On: 05/15/2019 11:10   Result Date: 05/15/2019 CLINICAL DATA:  PICC line placement EXAM: PORTABLE CHEST 1 VIEW COMPARISON:  05/14/2019 FINDINGS: 1025 hours. Patient is markedly rotated to the left. The left PICC line seen previously is no longer evident and a right PICC line is now visible. The tip of the right PICC line projects over the  left cardiomediastinal shadow on this exam. Given the marked rotation, tip position cannot be localized. Cardiopericardial silhouette is enlarged. Similar pulmonary edema pattern. No substantial pleural effusion. IMPRESSION: Right PICC line is new in the interval. Tip position cannot be adequately assessed given the marked leftward rotation. The tip of the PICC line projects too far to the left but given the presumed anterior positioning of the line, this may simply be related to the rotation. Repeat chest x-ray recommended with careful attention to AP positioning to better assess PICC line location. Electronically Signed: By: Misty Stanley M.D. On: 05/15/2019 10:49   Dg Chest Port 1 View  Result Date: 05/14/2019 CLINICAL DATA:  Evaluate PICC line EXAM: PORTABLE CHEST 1 VIEW COMPARISON:  May 10, 2019 FINDINGS: A left PICC line is been placed in the interval. It is difficult to see the distal if the believe it terminates in the right atrium 4.5 cm below the  caval atrial junction. Stable cardiomediastinal silhouette. No pneumothorax. Mild interstitial prominence centrally. IMPRESSION: 1. The left PICC line appears to terminate in the right atrium, 4.5 cm below the caval atrial junction. Recommend repositioning. 2. Suggested mild pulmonary venous congestion. These results will be called to the ordering clinician or representative by the Radiologist Assistant, and communication documented in the PACS or zVision Dashboard. Electronically Signed   By: Dorise Bullion III M.D   On: 05/14/2019 18:57   Dg Chest Port 1 View  Result Date: 05/10/2019 CLINICAL DATA:  PICC placement reassessment. EXAM: PORTABLE CHEST 1 VIEW COMPARISON:  05/09/2019. FINDINGS: PICC line noted with tip over superior vena cava on today's exam. This is in good anatomic position. Stable cardiomegaly. Low lung volumes with mild bibasilar atelectasis. IMPRESSION: 1. PICC line now in noted with tip projected over superior vena cava in good anatomic position. 2.  Stable cardiomegaly. 3.  Low lung volumes.  Mild bibasilar atelectasis. Electronically Signed   By: Marcello Moores  Register   On: 05/10/2019 09:52   Dg Chest Portable 1 View  Result Date: 05/09/2019 CLINICAL DATA:  Nausea and headache. EXAM: PORTABLE CHEST 1 VIEW COMPARISON:  Single-view of the chest 04/29/2019 and 02/17/2019. CT chest 04/22/2019. FINDINGS: The lungs are clear. Heart size is upper normal. No pneumothorax or pleural fluid. No acute or focal bony abnormality. Right PICC is backed out with its tip now projecting in the right axilla. IMPRESSION: No acute disease. The patient's right PICC is backed out with its tip now projecting in the right axilla. Electronically Signed   By: Inge Rise M.D.   On: 05/09/2019 18:44   Dg Chest Port 1 View  Result Date: 04/29/2019 CLINICAL DATA:  Post PICC line placement EXAM: PORTABLE CHEST 1 VIEW COMPARISON:  Portable exam 1739 hours compared to 02/17/2019 FINDINGS: RIGHT arm PICC line tip  projects over SVC. Enlargement of cardiac silhouette with vascular congestion. Lordotic positioning with slight rotation to the RIGHT. Slight asymmetric opacity of the LEFT hemithorax versus RIGHT may be related to rotation. No definite infiltrate, pleural effusion or pneumothorax. IMPRESSION: Enlargement of cardiac silhouette. Tip of RIGHT arm PICC line projects over SVC. Electronically Signed   By: Lavonia Dana M.D.   On: 04/29/2019 18:04   [place] Ekg Site Rite  Result Date: 04/29/2019 If Site Rite image not attached, placement could not be confirmed due to current cardiac rhythm.    Microbiology: No results found for this or any previous visit (from the past 240 hour(s)).   Labs: Basic Metabolic Panel: Recent Labs  Lab  05/19/19 0500 05/23/19 0655  NA 140 140  K 3.7 3.8  CL 102 101  CO2 27 28  GLUCOSE 79 157*  BUN 8 13  CREATININE 0.85 0.89  CALCIUM 7.9* 7.9*  MG 1.7  --    Liver Function Tests: No results for input(s): AST, ALT, ALKPHOS, BILITOT, PROT, ALBUMIN in the last 168 hours. No results for input(s): LIPASE, AMYLASE in the last 168 hours. No results for input(s): AMMONIA in the last 168 hours. CBC: Recent Labs  Lab 05/20/19 0502  WBC 8.1  HGB 10.2*  HCT 36.1  MCV 89.1  PLT 405*   Cardiac Enzymes: No results for input(s): CKTOTAL, CKMB, CKMBINDEX, TROPONINI in the last 168 hours. BNP: Invalid input(s): POCBNP CBG: Recent Labs  Lab 05/24/19 1109 05/24/19 1610 05/24/19 2359 05/25/19 0608 05/25/19 1115  GLUCAP 232* 114* 259* 168* 159*    Time coordinating discharge:  36 minutes  Signed:  Orson Eva, DO [facility] Pager: [phone] 05/25/2019, 11:30 AM

## 2019-05-25 NOTE — Progress Notes (Signed)
PT Cancellation Note  Patient Details Name: [name] MRN: [id number] DOB: 18-Apr-1951   Cancelled Treatment:    Reason Eval/Treat Not Completed: Other (comment)(Need for NT following bowel movement.  NT informed pt to be DC'd to SNF later today) Attempted PT session.  Pt very confused stated her name is "[name] and DOB 06/21/1945."  Pt unsure where she is at.  Noted bowel movement in bed, NT aware.  NT informed pt to return to SNF later today, will hold therapy today.   [address], LPTA; [place]  Aldona Lento 05/25/2019, 4:03 PM

## 2019-05-25 NOTE — Progress Notes (Signed)
PICC line removed, WNL.

## 2019-05-25 NOTE — TOC Transition Note (Signed)
Transition of Care [facility]) - CM/SW Discharge Note   Patient Details  Name: HAMNA ASA MRN: [id number] Date of Birth: November 16, 1950  Transition of Care [facility]) CM/SW Contact:  Ihor Gully, LCSW Phone Number: 05/25/2019, 12:01 PM   Clinical Narrative:    Patient discharging to [facility]. Message left for spouse advising of discharge. RN to call report.  LCSW signing off.          Patient Goals and CMS Choice        Discharge Placement              Patient chooses bed at: [facility] Patient to be transferred to facility by: [facility] staff Name of family member notified: message left for spouse Patient and family notified of of transfer: 05/25/19  Discharge Plan and Services                                     Social Determinants of Health (SDOH) Interventions     Readmission Risk Interventions Readmission Risk Prevention Plan 05/10/2019 04/30/2019 04/29/2019  Transportation Screening Complete Complete Complete  Medication Review Press photographer) Complete Complete Complete  PCP or Specialist appointment within 3-5 days of discharge Complete Complete Not Complete  PCP/Specialist Appt Not Complete comments Going tomorrow after discharge. - -  HRI or Home Care Consult Complete Complete Complete  SW Recovery Care/Counseling Consult Complete Patient refused Complete  Palliative Care Screening - Not Applicable Not Complete  Skilled Nursing Facility Not Complete Not Applicable Not Complete  Some recent data might be hidden

## 2019-05-26 ENCOUNTER — Encounter: Payer: Self-pay | Admitting: Adult Health

## 2019-05-26 ENCOUNTER — Non-Acute Institutional Stay (SKILLED_NURSING_FACILITY): Payer: BC Managed Care – PPO | Admitting: Adult Health

## 2019-05-26 DIAGNOSIS — I4891 Unspecified atrial fibrillation: Secondary | ICD-10-CM

## 2019-05-26 DIAGNOSIS — K219 Gastro-esophageal reflux disease without esophagitis: Secondary | ICD-10-CM

## 2019-05-26 DIAGNOSIS — I152 Hypertension secondary to endocrine disorders: Secondary | ICD-10-CM

## 2019-05-26 DIAGNOSIS — J449 Chronic obstructive pulmonary disease, unspecified: Secondary | ICD-10-CM | POA: Diagnosis not present

## 2019-05-26 DIAGNOSIS — I5033 Acute on chronic diastolic (congestive) heart failure: Secondary | ICD-10-CM | POA: Diagnosis not present

## 2019-05-26 DIAGNOSIS — E1165 Type 2 diabetes mellitus with hyperglycemia: Secondary | ICD-10-CM

## 2019-05-26 DIAGNOSIS — E1122 Type 2 diabetes mellitus with diabetic chronic kidney disease: Secondary | ICD-10-CM

## 2019-05-26 DIAGNOSIS — N183 Chronic kidney disease, stage 3 unspecified: Secondary | ICD-10-CM

## 2019-05-26 DIAGNOSIS — E1159 Type 2 diabetes mellitus with other circulatory complications: Secondary | ICD-10-CM

## 2019-05-26 DIAGNOSIS — I10 Essential (primary) hypertension: Secondary | ICD-10-CM

## 2019-05-26 DIAGNOSIS — IMO0002 Reserved for concepts with insufficient information to code with codable children: Secondary | ICD-10-CM

## 2019-05-26 DIAGNOSIS — E43 Unspecified severe protein-calorie malnutrition: Secondary | ICD-10-CM

## 2019-05-26 DIAGNOSIS — D631 Anemia in chronic kidney disease: Secondary | ICD-10-CM

## 2019-05-26 DIAGNOSIS — L88 Pyoderma gangrenosum: Secondary | ICD-10-CM

## 2019-05-26 DIAGNOSIS — Z794 Long term (current) use of insulin: Secondary | ICD-10-CM

## 2019-05-26 DIAGNOSIS — E876 Hypokalemia: Secondary | ICD-10-CM

## 2019-05-26 DIAGNOSIS — N1831 Chronic kidney disease, stage 3a: Secondary | ICD-10-CM

## 2019-05-26 DIAGNOSIS — R410 Disorientation, unspecified: Secondary | ICD-10-CM

## 2019-05-26 NOTE — Progress Notes (Signed)
Location:    [place] Room Number: 158/P Place of Service:  SNF (31)   CODE STATUS: Full Code  Allergies  Allergen Reactions  . Feraheme [Ferumoxytol] Other (See Comments)    Back pain (yelling out with back pain)  . Propranolol Swelling    Pt states it may have been leg swelling  . Topamax [Topiramate] Other (See Comments)    hallucinations  . Latex Itching and Rash    Chief Complaint  Patient presents with  . Hospitalization Follow-up    Hospitalization Follow Up,     HPI:  She is a 68 year old woman who has been hospitalized from 05-09-19 through 05-25-19. She had been hospitalized earlier in September for an infected abdominal wound. She was placed on IV abt and was discharged home at that time with IV abt. She was admitted and placed on cardizem drip and converted to po. She did have some hypoglycemia due to poor po intake. She did develop confusion and was placed on low dose seroquel. She is here for short term rehab and wound management. Her goal is to return back home. She denies any uncontrolled pain. She denies insomnia. Her appetite remains poor. She will continue to be followed for her chronic illnesses including: hypertension; diabetes; afib.   Past Medical History:  Diagnosis Date  . Atrial fibrillation ([place])   . CHF (congestive heart failure) ([place])   . Depression   . Diabetes mellitus without complication ([place])   . 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 Hypertension   . Morbid obesity ([place])   . Osteoporosis   . Urinary incontinence   . UTI (lower urinary tract infection) 01/2016   Cipro for Klebsiella pneumoniae isolate  . Wheelchair bound    bedbound    Past Surgical History:  Procedure Laterality Date  . ABDOMINAL HYSTERECTOMY    . APPLICATION OF WOUND VAC  03/09/2019   Procedure: APPLICATION OF WOUND VAC;  Surgeon: Virl Cagey, MD;  Location: [facility] ORS;  Service: General;;  . BIOPSY   09/06/2018   Procedure: BIOPSY;  Surgeon: Danie Binder, MD;  Location: [facility] ENDO SUITE;  Service: Endoscopy;;  gastric bx's  . CESAREAN SECTION    . CHOLECYSTECTOMY    . ESOPHAGEAL DILATION  09/06/2018   Procedure: ESOPHAGEAL DILATION;  Surgeon: Danie Binder, MD;  Location: [facility];  Service: Endoscopy;;  . ESOPHAGOGASTRODUODENOSCOPY (EGD) WITH PROPOFOL N/A 09/06/2018   Procedure: ESOPHAGOGASTRODUODENOSCOPY (EGD) WITH PROPOFOL;  Surgeon: Danie Binder, MD;  Location[facility];  Service: Endoscopy;  Laterality: N/A;  dilatation  . FEMUR IM NAIL Left 02/20/2016  . FEMUR IM NAIL Left 02/20/2016   Procedure: INTRAMEDULLARY (IM) RETROGRADE FEMORAL NAILING;  Surgeon: Leandrew Koyanagi, MD;  Location: [place];  Service: Orthopedics;  Laterality: Left;  . PANCREAS SURGERY  1967   1 cyst excised and one cyst drained  . TEE WITHOUT CARDIOVERSION N/A 04/05/2018   Procedure: TRANSESOPHAGEAL ECHOCARDIOGRAM (TEE);  Surgeon: Dorothy Spark, MD;  Location: [place];  Service: Cardiovascular;  Laterality: N/A;  . [place]    . WOUND DEBRIDEMENT N/A 03/09/2019   Procedure: EXCISIONAL DEBRIDEMENT OF ABDOMINAL WOUND ULCERS;  Surgeon: Virl Cagey, MD;  Location: [facility];  Service: General;  Laterality: N/A;  . WRIST FRACTURE SURGERY      Social History   Socioeconomic History  . Marital status: Married    Spouse name: 1  .  Number of children: Not on file  . Years of education: Not on file  . Highest education level: Not on file  Occupational History  . Occupation: retired  Scientific laboratory technician  . Financial resource strain: Not on file  . Food insecurity    Worry: Not on file    Inability: Not on file  . Transportation needs    Medical: No    Non-medical: No  Tobacco Use  . Smoking status: Never Smoker  . Smokeless tobacco: Never Used  Substance and Sexual Activity  . Alcohol use: No  . Drug use: No  . Sexual activity: Not on file  Lifestyle  . Physical activity     Days per week: Not on file    Minutes per session: Not on file  . Stress: Not on file  Relationships  . Social Herbalist on phone: Not on file    Gets together: Not on file    Attends religious service: Not on file    Active member of club or organization: Not on file    Attends meetings of clubs or organizations: Not on file    Relationship status: Not on file  . Intimate partner violence    Fear of current or ex partner: Not on file    Emotionally abused: Not on file    Physically abused: Not on file    Forced sexual activity: Not on file  Other Topics Concern  . Not on file  Social History Narrative   Lives in [place] with husband.  More or less w/c bound since leg fx about 3 yrs ago.   Family History  Problem Relation Age of Onset  . Diabetes Mother        died in her 11's of a stroke  . Cancer Mother   . Stroke Mother   . Breast cancer Mother   . Diabetes Father        died in his 79's of a stroke.  . Stroke Father   . Diabetes Brother        died @ 72 of a stroke.  . Stroke Brother   . Diabetes Maternal Grandmother   . Diabetes Maternal Grandfather   . Diabetes Paternal Grandmother   . Diabetes Paternal Grandfather   . Breast cancer Maternal Aunt       VITAL SIGNS BP 120/73   Pulse 81   Temp 98.2 F (36.8 C) (Oral)   Resp 19   Ht 5' 4"  (1.626 m)   Wt 171 lb (77.6 kg)   BMI 29.35 kg/m   Outpatient Encounter Medications as of 05/26/2019  Medication Sig  . albuterol (PROVENTIL HFA;VENTOLIN HFA) 108 (90 Base) MCG/ACT inhaler Inhale 2 puffs into the lungs every 6 (six) hours as needed for wheezing or shortness of breath.  . Amino Acids-Protein Hydrolys (FEEDING SUPPLEMENT, PRO-STAT SUGAR FREE 64,) LIQD Take 30 mLs by mouth 3 (three) times daily.  Marland Kitchen apixaban (ELIQUIS) 5 MG TABS tablet Take 1 tablet (5 mg total) by mouth 2 (two) times daily.  Marland Kitchen diltiazem (CARDIZEM CD) 120 MG 24 hr capsule Take 1 capsule (120 mg total) by mouth daily.  .  ferrous sulfate (FERROUSUL) 325 (65 FE) MG tablet Take 1 tablet (325 mg total) by mouth 2 (two) times daily with a meal.  . fluticasone (FLONASE) 50 MCG/ACT nasal spray Place 2 sprays into both nostrils daily as needed for allergies or rhinitis (congestion).   . furosemide (LASIX) 20 MG tablet Take  1 tablet (20 mg total) by mouth daily.  . insulin aspart (NOVOLOG FLEXPEN) 100 UNIT/ML FlexPen Per Sliding Scale;  If Blood Sugar is less than 60, call MD. If Blood Sugar is 151 to 200, give 1 Units. If Blood Sugar is 201 to 250, give 3 Units. If Blood Sugar is 251 to 300, give 4 Units. If Blood Sugar is 301 to 350, give 5 Units. If Blood Sugar is 351 to 400, give 7 Units. If Blood Sugar is greater than 400, call MD. subcutaneous  Four Times A Day 07:30 AM, 11:30 AM, 04:30 PM, 09:00 PM  . levalbuterol (XOPENEX) 0.63 MG/3ML nebulizer solution Take 0.63 mg by nebulization every 6 (six) hours as needed for wheezing or shortness of breath.   . metoprolol tartrate (LOPRESSOR) 50 MG tablet Take 50 mg by mouth 2 (two) times daily.   . nutrition supplement, JUVEN, (JUVEN) PACK Take 1 packet by mouth 2 (two) times daily between meals.  . pantoprazole (PROTONIX) 40 MG tablet Take 1 tablet (40 mg total) by mouth daily before breakfast.  . potassium chloride SA (KLOR-CON) 20 MEQ tablet Take 2 tablets (40 mEq total) by mouth daily.  . QUEtiapine (SEROQUEL) 25 MG tablet Take 0.5 tablets (12.5 mg total) by mouth at bedtime.  Marland Kitchen SPIRIVA RESPIMAT 1.25 MCG/ACT AERS Inhale 1 puff into the lungs daily.   . [DISCONTINUED] insulin aspart (NOVOLOG) 100 UNIT/ML injection Inject 0-7 Units into the skin 4 (four) times daily -  before meals and at bedtime. CBG 121-150--no units;  151-200--1 unit;  201-250--3 units;  251-300--4 units;  301-350--5 units; 351-400--7 units (Patient taking differently: Per Sliding Scale;  If Blood Sugar is less than 60, call MD. If Blood Sugar is 151 to 200, give 1 Units. If Blood Sugar is 201 to  250, give 3 Units. If Blood Sugar is 251 to 300, give 4 Units. If Blood Sugar is 301 to 350, give 5 Units. If Blood Sugar is 351 to 400, give 7 Units. If Blood Sugar is greater than 400, call MD. subcutaneous  Four Times A Day 07:30 AM, 11:30 AM, 04:30 PM, 09:00 PM)  . [DISCONTINUED] nystatin (MYCOSTATIN/NYSTOP) powder Apply topically daily as needed (rash in skin folds).  . [DISCONTINUED] OVER THE COUNTER MEDICATION Apply 1 application topically daily as needed (rash in skin folds). Baby butt paste   No facility-administered encounter medications on file as of 05/26/2019.      SIGNIFICANT DIAGNOSTIC EXAMS  TODAY;   05-09-19: chest x-ray: No acute disease   05-17-19: EEG: This study is suggestive of moderate diffuse encephalopathy, non specific to etiology.  No seizures or epileptiform discharges were seen throughout the recording.   05-17-19: ct of head: Examination is significantly limited by motion artifact throughout. Within this limitation, no acute intracranial pathology. Small-vessel white matter disease.  LABS REVIEWED TODAY:   05-09-19:wbc 7.7; hgb 8.8; hct 31.4; mcv 89.5 ;plt 343; glucose 193; bun 9; creat 0.63; k+ 3.4; na++ 140; ca 7.5; liver normal albumin 2.1; BNP 461.0; hgb a1c 8.8 05-11-19: wbc 5.9; hgb 8.2; hct 29.4; mcv 89.1 plt 338; glucose 279; bun 12; creat 0.70; k+ 3.9; na++ 139; ca 7.3; mag 1.2 05-16-19: wbc 9.9; hgb 9.0; hct 30.3; mcv 85.1 pl 371; glucose 93; bun 10; creat 0.90; k+ 3.3; na++ 139; ca 8.1; mag 1.2 tsh 1.071 05-19-19: glucose 79; bun 8; creat 0.85; k+ 3.7; na++ 140; ca 7.9; mag 1.7 05-23-19: glucose 157; bun 13; creat 0.89; k+ 3.8; na++ 140; ca 7.9  Review of Systems  Constitutional: Negative for malaise/fatigue.       Poor appetite   Respiratory: Negative for cough and shortness of breath.   Cardiovascular: Negative for chest pain, palpitations and leg swelling.  Gastrointestinal: Negative for abdominal pain, constipation and heartburn.   Musculoskeletal: Negative for back pain, joint pain and myalgias.  Skin: Negative.   Neurological: Negative for dizziness.  Psychiatric/Behavioral: The patient is not nervous/anxious.      Physical Exam Constitutional:      General: She is not in acute distress.    Appearance: She is well-developed. She is obese. She is not diaphoretic.  Neck:     Musculoskeletal: Neck supple.     Thyroid: No thyromegaly.  Cardiovascular:     Rate and Rhythm: Normal rate and regular rhythm.     Pulses: Normal pulses.     Heart sounds: Normal heart sounds.  Pulmonary:     Effort: Pulmonary effort is normal. No respiratory distress.     Breath sounds: Normal breath sounds.  Abdominal:     General: Bowel sounds are normal. There is no distension.     Palpations: Abdomen is soft.     Tenderness: There is no abdominal tenderness.  Musculoskeletal:     Right lower leg: No edema.     Left lower leg: No edema.     Comments: Is able to move all extremities   Lymphadenopathy:     Cervical: No cervical adenopathy.  Skin:    General: Skin is warm and dry.     Comments: Abdominal incision line: open without signs of infection present 19.5 x 2.5 x 1.8 cm   Neurological:     Mental Status: She is alert and oriented to person, place, and time.  Psychiatric:        Mood and Affect: Mood normal.       ASSESSMENT/ PLAN:  TODAY;   1.  Hypertension associated with type 2 diabetes mellitus: stable b/p 120/73: will continue lopressor 50 mg twice daily and cardizem cd 120 mg daily   2. Atrial fibrillation with RVR: heart rate is stable: will continue lopressor 50 mg twice daily cardizem cd 120 mg daily for rate control will continue eliquis 5 mg twice daily   3. Acute on chronic diastolic CHG (congestive heart failure) is stable will continue lasix 20 mg daily with k+ 40 meq daily   4. Chronic obstructive pulmonary disease unspecified copd type: is stable spiriva respimet 125 mcg 1 puff daily xopenex  0.63 mg every 6 hours as needed; albuterol 2 puff every 6 hours as needed; flonase daily as needed  5. GERD without esophagitis: is stable will continue protonix 40 mg daily   6. CKD stage 3 due to type 2 diabetes mellitus: is stable bun 13 creat 0.89; will monitor  7. Uncontrolled type 2 diabetes mellitus with chronic kidney disease with lung term current use of insulin: is without change: hgb a1c 8.8; will continue novolog SSI 151-200 1 unit; 201-250 3 units; 251-300 4 units; 301-350 5 units; 351-400 7 units. Will monitor   8. Anemia due to stage 3a chronic kidney disease: is stable hgb 9.0 will continue iron daily   9. Protein calorie malnutrition/pyoderma gangrenosum: albumin 2.1; will continue prostat 30 cc three time daily and juven twice daily   10. Hypokalemia: k+ 3.8 will continue k+ 40 meq daily  11. Acute delirium: is presently stable will continue seroquel 12.5 mg nightly        MD  is aware of resident's narcotic use and is in agreement with current plan of care. We will attempt to wean resident as appropriate.  Ok Edwards NP [facility]  Contact [phone] Monday through Friday 8am- 5pm  After hours call [phone]

## 2019-05-27 ENCOUNTER — Non-Acute Institutional Stay (SKILLED_NURSING_FACILITY): Payer: BC Managed Care – PPO | Admitting: Adult Health

## 2019-05-27 DIAGNOSIS — I4891 Unspecified atrial fibrillation: Secondary | ICD-10-CM

## 2019-05-27 DIAGNOSIS — E1159 Type 2 diabetes mellitus with other circulatory complications: Secondary | ICD-10-CM | POA: Diagnosis not present

## 2019-05-27 DIAGNOSIS — L88 Pyoderma gangrenosum: Secondary | ICD-10-CM | POA: Diagnosis not present

## 2019-05-27 DIAGNOSIS — I10 Essential (primary) hypertension: Secondary | ICD-10-CM

## 2019-05-27 NOTE — Progress Notes (Signed)
Location:    [facility] Room Number: 158/P Place of Service:  SNF (31)   CODE STATUS: Full Code  Allergies  Allergen Reactions  . Feraheme [Ferumoxytol] Other (See Comments)    Back pain (yelling out with back pain)  . Propranolol Swelling    Pt states it may have been leg swelling  . Topamax [Topiramate] Other (See Comments)    hallucinations  . Latex Itching and Rash    Chief Complaint  Patient presents with  . Acute Visit    [place],     HPI:  We have come together for her 84 hour care plan meeting. She has a ramp into her house and has a wheelchair. She lives with her spouse and granddaughter. Her goal is to return back home. She denies any uncontrolled pain; her appetite remains poor. She continues to work with therapy as tolerated.   Past Medical History:  Diagnosis Date  . Atrial fibrillation ([place])   . CHF (congestive heart failure) ([place])   . Depression   . Diabetes mellitus without complication ([place])   . 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 Hypertension   . Morbid obesity ([place])   . Osteoporosis   . Urinary incontinence   . UTI (lower urinary tract infection) 01/2016   Cipro for Klebsiella pneumoniae isolate  . Wheelchair bound    bedbound    Past Surgical History:  Procedure Laterality Date  . ABDOMINAL HYSTERECTOMY    . APPLICATION OF WOUND VAC  03/09/2019   Procedure: APPLICATION OF WOUND VAC;  Surgeon: Virl Cagey, MD;  Location: [facility];  Service: General;;  . BIOPSY  09/06/2018   Procedure: BIOPSY;  Surgeon: Danie Binder, MD;  [facility];  Service: Endoscopy;;  gastric bx's  . CESAREAN SECTION    . CHOLECYSTECTOMY    . ESOPHAGEAL DILATION  09/06/2018   Procedure: ESOPHAGEAL DILATION;  Surgeon: Danie Binder, MD;  [facility];  Service: Endoscopy;;  . ESOPHAGOGASTRODUODENOSCOPY (EGD) WITH PROPOFOL N/A 09/06/2018   Procedure:  ESOPHAGOGASTRODUODENOSCOPY (EGD) WITH PROPOFOL;  Surgeon: Danie Binder, MD;  [facility];  Service: Endoscopy;  Laterality: N/A;  dilatation  . FEMUR IM NAIL Left 02/20/2016  . FEMUR IM NAIL Left 02/20/2016   Procedure: INTRAMEDULLARY (IM) RETROGRADE FEMORAL NAILING;  Surgeon: Leandrew Koyanagi, MD;  Location: [facility];  Service: Orthopedics;  Laterality: Left;  . PANCREAS SURGERY  1967   1 cyst excised and one cyst drained  . TEE WITHOUT CARDIOVERSION N/A 04/05/2018   Procedure: TRANSESOPHAGEAL ECHOCARDIOGRAM (TEE);  Surgeon: Dorothy Spark, MD;  Location: [facility];  Service: Cardiovascular;  Laterality: N/A;  . [place]    . WOUND DEBRIDEMENT N/A 03/09/2019   Procedure: EXCISIONAL DEBRIDEMENT OF ABDOMINAL WOUND ULCERS;  Surgeon: Virl Cagey, MD;  Location: [facility];  Service: General;  Laterality: N/A;  . WRIST FRACTURE SURGERY      Social History   Socioeconomic History  . Marital status: Married    Spouse name: 1  . Number of children: Not on file  . Years of education: Not on file  . Highest education level: Not on file  Occupational History  . Occupation: retired  Scientific laboratory technician  . Financial resource strain: Not on file  . Food insecurity    Worry: Not on file    Inability: Not on file  . Transportation needs  Medical: No    Non-medical: No  Tobacco Use  . Smoking status: Never Smoker  . Smokeless tobacco: Never Used  Substance and Sexual Activity  . Alcohol use: No  . Drug use: No  . Sexual activity: Not on file  Lifestyle  . Physical activity    Days per week: Not on file    Minutes per session: Not on file  . Stress: Not on file  Relationships  . Social Herbalist on phone: Not on file    Gets together: Not on file    Attends religious service: Not on file    Active member of club or organization: Not on file    Attends meetings of clubs or organizations: Not on file    Relationship status: Not on file  .  Intimate partner violence    Fear of current or ex partner: Not on file    Emotionally abused: Not on file    Physically abused: Not on file    Forced sexual activity: Not on file  Other Topics Concern  . Not on file  Social History Narrative   Lives in [place] with husband.  More or less w/c bound since leg fx about 3 yrs ago.   Family History  Problem Relation Age of Onset  . Diabetes Mother        died in her 18's of a stroke  . Cancer Mother   . Stroke Mother   . Breast cancer Mother   . Diabetes Father        died in his 102's of a stroke.  . Stroke Father   . Diabetes Brother        died @ 13 of a stroke.  . Stroke Brother   . Diabetes Maternal Grandmother   . Diabetes Maternal Grandfather   . Diabetes Paternal Grandmother   . Diabetes Paternal Grandfather   . Breast cancer Maternal Aunt       VITAL SIGNS BP 118/70   Pulse 83   Temp 97.7 F (36.5 C) (Oral)   Resp 20   Ht 5' 4"  (1.626 m)   Wt 170 lb 3.2 oz (77.2 kg)   BMI 29.21 kg/m   Outpatient Encounter Medications as of 05/27/2019  Medication Sig  . albuterol (PROVENTIL HFA;VENTOLIN HFA) 108 (90 Base) MCG/ACT inhaler Inhale 2 puffs into the lungs every 6 (six) hours as needed for wheezing or shortness of breath.  . Amino Acids-Protein Hydrolys (FEEDING SUPPLEMENT, PRO-STAT SUGAR FREE 64,) LIQD Take 30 mLs by mouth 3 (three) times daily.  Marland Kitchen apixaban (ELIQUIS) 5 MG TABS tablet Take 1 tablet (5 mg total) by mouth 2 (two) times daily.  Marland Kitchen diltiazem (CARDIZEM CD) 120 MG 24 hr capsule Take 1 capsule (120 mg total) by mouth daily.  . ferrous sulfate (FERROUSUL) 325 (65 FE) MG tablet Take 1 tablet (325 mg total) by mouth 2 (two) times daily with a meal.  . fluticasone (FLONASE) 50 MCG/ACT nasal spray Place 2 sprays into both nostrils daily as needed for allergies or rhinitis (congestion).   . furosemide (LASIX) 20 MG tablet Take 1 tablet (20 mg total) by mouth daily.  . insulin aspart (NOVOLOG FLEXPEN) 100 UNIT/ML  FlexPen Per Sliding Scale;  If Blood Sugar is less than 60, call MD. If Blood Sugar is 151 to 200, give 1 Units. If Blood Sugar is 201 to 250, give 3 Units. If Blood Sugar is 251 to 300, give 4 Units. If Blood Sugar  is 301 to 350, give 5 Units. If Blood Sugar is 351 to 400, give 7 Units. If Blood Sugar is greater than 400, call MD. subcutaneous  Four Times A Day 07:30 AM, 11:30 AM, 04:30 PM, 09:00 PM  . levalbuterol (XOPENEX) 0.63 MG/3ML nebulizer solution Take 0.63 mg by nebulization every 6 (six) hours as needed for wheezing or shortness of breath.   . metoprolol tartrate (LOPRESSOR) 50 MG tablet Take 50 mg by mouth 2 (two) times daily.   . NON FORMULARY Diet: Regular, Consistent Carbohydrate  . nutrition supplement, JUVEN, (JUVEN) PACK Take 1 packet by mouth 2 (two) times daily between meals.  . pantoprazole (PROTONIX) 40 MG tablet Take 1 tablet (40 mg total) by mouth daily before breakfast.  . potassium chloride SA (KLOR-CON) 20 MEQ tablet Take 2 tablets (40 mEq total) by mouth daily.  . QUEtiapine (SEROQUEL) 25 MG tablet Take 0.5 tablets (12.5 mg total) by mouth at bedtime.  Marland Kitchen SPIRIVA RESPIMAT 1.25 MCG/ACT AERS Inhale 1 puff into the lungs daily.    No facility-administered encounter medications on file as of 05/27/2019.      SIGNIFICANT DIAGNOSTIC EXAMS  PREVIOUS;   05-09-19: chest x-ray: No acute disease   05-17-19: EEG: This study is suggestive of moderate diffuse encephalopathy, non specific to etiology.  No seizures or epileptiform discharges were seen throughout the recording.   05-17-19: ct of head: Examination is significantly limited by motion artifact throughout. Within this limitation, no acute intracranial pathology. Small-vessel white matter disease.  NO NEW EXAMS.   LABS REVIEWED PREVIOUS:   05-09-19:wbc 7.7; hgb 8.8; hct 31.4; mcv 89.5 ;plt 343; glucose 193; bun 9; creat 0.63; k+ 3.4; na++ 140; ca 7.5; liver normal albumin 2.1; BNP 461.0; hgb a1c 8.8 05-11-19:  wbc 5.9; hgb 8.2; hct 29.4; mcv 89.1 plt 338; glucose 279; bun 12; creat 0.70; k+ 3.9; na++ 139; ca 7.3; mag 1.2 05-16-19: wbc 9.9; hgb 9.0; hct 30.3; mcv 85.1 pl 371; glucose 93; bun 10; creat 0.90; k+ 3.3; na++ 139; ca 8.1; mag 1.2 tsh 1.071 05-19-19: glucose 79; bun 8; creat 0.85; k+ 3.7; na++ 140; ca 7.9; mag 1.7 05-23-19: glucose 157; bun 13; creat 0.89; k+ 3.8; na++ 140; ca 7.9   NO NEW LABS.   Review of Systems  Constitutional: Negative for malaise/fatigue.       Poor appetite   Respiratory: Negative for cough and shortness of breath.   Cardiovascular: Negative for chest pain, palpitations and leg swelling.  Gastrointestinal: Negative for abdominal pain, constipation and heartburn.  Musculoskeletal: Negative for back pain, joint pain and myalgias.  Skin: Negative.   Neurological: Negative for dizziness.  Psychiatric/Behavioral: The patient is not nervous/anxious.     Physical Exam Constitutional:      General: She is not in acute distress.    Appearance: She is well-developed. She is obese. She is not diaphoretic.  Neck:     Musculoskeletal: Neck supple.     Thyroid: No thyromegaly.  Cardiovascular:     Rate and Rhythm: Normal rate and regular rhythm.     Pulses: Normal pulses.     Heart sounds: Normal heart sounds.  Pulmonary:     Effort: Pulmonary effort is normal. No respiratory distress.     Breath sounds: Normal breath sounds.  Abdominal:     General: Bowel sounds are normal. There is no distension.     Palpations: Abdomen is soft.     Tenderness: There is no abdominal tenderness.  Musculoskeletal:  Right lower leg: No edema.     Left lower leg: No edema.     Comments: Is able to move all extremities   Lymphadenopathy:     Cervical: No cervical adenopathy.  Skin:    General: Skin is warm and dry.     Comments:  Abdominal incision line: open without signs of infection present 19.5 x 2.5 x 1.8 cm    Neurological:     Mental Status: She is alert and oriented  to person, place, and time.  Psychiatric:        Mood and Affect: Mood normal.      ASSESSMENT/ PLAN:  TODAY  1. Pyoderma gangrenosum 2. Atrial fibrillation with RVR 3. Hypertension associated with type 2 diabetes mellitus  Will continue therapy as directed Will continue medications Will continue to monitor her status.   MD is aware of resident's narcotic use and is in agreement with current plan of care. We will attempt to wean resident as appropriate.  Ok Edwards NP [facility]  Contact [phone] Monday through Friday 8am- 5pm  After hours call [phone]

## 2019-05-31 ENCOUNTER — Encounter: Payer: Self-pay | Admitting: Adult Health

## 2019-05-31 ENCOUNTER — Non-Acute Institutional Stay (SKILLED_NURSING_FACILITY): Payer: BC Managed Care – PPO | Admitting: Adult Health

## 2019-05-31 ENCOUNTER — Ambulatory Visit: Payer: BC Managed Care – PPO | Admitting: General Surgery

## 2019-05-31 DIAGNOSIS — N1831 Chronic kidney disease, stage 3a: Secondary | ICD-10-CM | POA: Insufficient documentation

## 2019-05-31 DIAGNOSIS — L98492 Non-pressure chronic ulcer of skin of other sites with fat layer exposed: Secondary | ICD-10-CM | POA: Diagnosis not present

## 2019-05-31 DIAGNOSIS — I152 Hypertension secondary to endocrine disorders: Secondary | ICD-10-CM | POA: Insufficient documentation

## 2019-05-31 DIAGNOSIS — E1122 Type 2 diabetes mellitus with diabetic chronic kidney disease: Secondary | ICD-10-CM

## 2019-05-31 DIAGNOSIS — E1165 Type 2 diabetes mellitus with hyperglycemia: Secondary | ICD-10-CM | POA: Diagnosis not present

## 2019-05-31 DIAGNOSIS — E1159 Type 2 diabetes mellitus with other circulatory complications: Secondary | ICD-10-CM | POA: Insufficient documentation

## 2019-05-31 DIAGNOSIS — K219 Gastro-esophageal reflux disease without esophagitis: Secondary | ICD-10-CM | POA: Insufficient documentation

## 2019-05-31 DIAGNOSIS — IMO0002 Reserved for concepts with insufficient information to code with codable children: Secondary | ICD-10-CM

## 2019-05-31 DIAGNOSIS — E43 Unspecified severe protein-calorie malnutrition: Secondary | ICD-10-CM | POA: Insufficient documentation

## 2019-05-31 DIAGNOSIS — R1114 Bilious vomiting: Secondary | ICD-10-CM

## 2019-05-31 DIAGNOSIS — Z794 Long term (current) use of insulin: Secondary | ICD-10-CM

## 2019-05-31 NOTE — Progress Notes (Signed)
Location:    [facility] Room Number: 158/P Place of Service:  SNF (31)   CODE STATUS: Full Code  Allergies  Allergen Reactions  . Feraheme [Ferumoxytol] Other (See Comments)    Back pain (yelling out with back pain)  . Propranolol Swelling    Pt states it may have been leg swelling  . Topamax [Topiramate] Other (See Comments)    hallucinations  . Latex Itching and Rash    Chief Complaint  Patient presents with  . Acute Visit    Wound Care,     HPI:  She has an abdominal surgical wound. Her cbg readings are elevated. She is complaining of nausea states she is unable to eat. She states that she has had the nausea for about 2-3 weeks prior to her hospitalization. She denies any hallucinations; no agitated feelings. There are no reports of fevers.   Past Medical History:  Diagnosis Date  . Atrial fibrillation ([place])   . CHF (congestive heart failure) ([place])   . Depression   . Diabetes mellitus without complication ([place])   . 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 Hypertension   . Morbid obesity ([place])   . Osteoporosis   . Urinary incontinence   . UTI (lower urinary tract infection) 01/2016   Cipro for Klebsiella pneumoniae isolate  . Wheelchair bound    bedbound    Past Surgical History:  Procedure Laterality Date  . ABDOMINAL HYSTERECTOMY    . APPLICATION OF WOUND VAC  03/09/2019   Procedure: APPLICATION OF WOUND VAC;  Surgeon: Virl Cagey, MD;  Location: [facility];  Service: General;;  . BIOPSY  09/06/2018   Procedure: BIOPSY;  Surgeon: Danie Binder, MD;  [facility];  Service: Endoscopy;;  gastric bx's  . CESAREAN SECTION    . CHOLECYSTECTOMY    . ESOPHAGEAL DILATION  09/06/2018   Procedure: ESOPHAGEAL DILATION;  Surgeon: Danie Binder, MD;  [facility];  Service: Endoscopy;;  . ESOPHAGOGASTRODUODENOSCOPY (EGD) WITH PROPOFOL N/A 09/06/2018   Procedure:  ESOPHAGOGASTRODUODENOSCOPY (EGD) WITH PROPOFOL;  Surgeon: Danie Binder, MD;  [facility];  Service: Endoscopy;  Laterality: N/A;  dilatation  . FEMUR IM NAIL Left 02/20/2016  . FEMUR IM NAIL Left 02/20/2016   Procedure: INTRAMEDULLARY (IM) RETROGRADE FEMORAL NAILING;  Surgeon: Leandrew Koyanagi, MD;  Location: [facility];  Service: Orthopedics;  Laterality: Left;  . PANCREAS SURGERY  1967   1 cyst excised and one cyst drained  . TEE WITHOUT CARDIOVERSION N/A 04/05/2018   Procedure: TRANSESOPHAGEAL ECHOCARDIOGRAM (TEE);  Surgeon: Dorothy Spark, MD;  Location: [facility];  Service: Cardiovascular;  Laterality: N/A;  . [place]    . WOUND DEBRIDEMENT N/A 03/09/2019   Procedure: EXCISIONAL DEBRIDEMENT OF ABDOMINAL WOUND ULCERS;  Surgeon: Virl Cagey, MD;  Location: [facility];  Service: General;  Laterality: N/A;  . WRIST FRACTURE SURGERY      Social History   Socioeconomic History  . Marital status: Married    Spouse name: 1  . Number of children: Not on file  . Years of education: Not on file  . Highest education level: Not on file  Occupational History  . Occupation: retired  Scientific laboratory technician  . Financial resource strain: Not on file  . Food insecurity    Worry: Not on file    Inability: Not on file  . Transportation needs    Medical: No  Non-medical: No  Tobacco Use  . Smoking status: Never Smoker  . Smokeless tobacco: Never Used  Substance and Sexual Activity  . Alcohol use: No  . Drug use: No  . Sexual activity: Not on file  Lifestyle  . Physical activity    Days per week: Not on file    Minutes per session: Not on file  . Stress: Not on file  Relationships  . Social Herbalist on phone: Not on file    Gets together: Not on file    Attends religious service: Not on file    Active member of club or organization: Not on file    Attends meetings of clubs or organizations: Not on file    Relationship status: Not on file  .  Intimate partner violence    Fear of current or ex partner: Not on file    Emotionally abused: Not on file    Physically abused: Not on file    Forced sexual activity: Not on file  Other Topics Concern  . Not on file  Social History Narrative   Lives in [place] with husband.  More or less w/c bound since leg fx about 3 yrs ago.   Family History  Problem Relation Age of Onset  . Diabetes Mother        died in her 48's of a stroke  . Cancer Mother   . Stroke Mother   . Breast cancer Mother   . Diabetes Father        died in his 51's of a stroke.  . Stroke Father   . Diabetes Brother        died @ 8 of a stroke.  . Stroke Brother   . Diabetes Maternal Grandmother   . Diabetes Maternal Grandfather   . Diabetes Paternal Grandmother   . Diabetes Paternal Grandfather   . Breast cancer Maternal Aunt       VITAL SIGNS BP 132/80   Pulse 86   Temp 98.2 F (36.8 C) (Oral)   Resp 20   Ht 5' 4"  (1.626 m)   Wt 171 lb 9.6 oz (77.8 kg)   BMI 29.46 kg/m   Outpatient Encounter Medications as of 05/31/2019  Medication Sig  . albuterol (PROVENTIL HFA;VENTOLIN HFA) 108 (90 Base) MCG/ACT inhaler Inhale 2 puffs into the lungs every 6 (six) hours as needed for wheezing or shortness of breath.  . Amino Acids-Protein Hydrolys (FEEDING SUPPLEMENT, PRO-STAT SUGAR FREE 64,) LIQD Take 30 mLs by mouth 3 (three) times daily.  Marland Kitchen apixaban (ELIQUIS) 5 MG TABS tablet Take 1 tablet (5 mg total) by mouth 2 (two) times daily.  Marland Kitchen diltiazem (CARDIZEM CD) 120 MG 24 hr capsule Take 1 capsule (120 mg total) by mouth daily.  . ferrous sulfate (FERROUSUL) 325 (65 FE) MG tablet Take 1 tablet (325 mg total) by mouth 2 (two) times daily with a meal.  . fluticasone (FLONASE) 50 MCG/ACT nasal spray Place 2 sprays into both nostrils daily as needed for allergies or rhinitis (congestion).   . furosemide (LASIX) 20 MG tablet Take 1 tablet (20 mg total) by mouth daily.  . insulin aspart (NOVOLOG PENFILL) cartridge  Inject 6 Units into the skin 3 (three) times daily with meals.  . Insulin Glargine (LANTUS SOLOSTAR) 100 UNIT/ML Solostar Pen Inject 10 Units into the skin at bedtime.  . levalbuterol (XOPENEX) 0.63 MG/3ML nebulizer solution Take 0.63 mg by nebulization every 6 (six) hours as needed for wheezing or shortness  of breath.   . metoprolol tartrate (LOPRESSOR) 50 MG tablet Take 50 mg by mouth 2 (two) times daily.   . NON FORMULARY Diet: Regular, Consistent Carbohydrate  . nutrition supplement, JUVEN, (JUVEN) PACK Take 1 packet by mouth 2 (two) times daily between meals.  . ondansetron (ZOFRAN) 4 MG tablet Take 4 mg by mouth. Before meals for nausea  . pantoprazole (PROTONIX) 40 MG tablet Take 1 tablet (40 mg total) by mouth daily before breakfast.  . potassium chloride SA (KLOR-CON) 20 MEQ tablet Take 2 tablets (40 mEq total) by mouth daily.  Marland Kitchen SPIRIVA RESPIMAT 1.25 MCG/ACT AERS Inhale 1 puff into the lungs daily.   . [DISCONTINUED] insulin aspart (NOVOLOG FLEXPEN) 100 UNIT/ML FlexPen Per Sliding Scale;  If Blood Sugar is less than 60, call MD. If Blood Sugar is 151 to 200, give 1 Units. If Blood Sugar is 201 to 250, give 3 Units. If Blood Sugar is 251 to 300, give 4 Units. If Blood Sugar is 301 to 350, give 5 Units. If Blood Sugar is 351 to 400, give 7 Units. If Blood Sugar is greater than 400, call MD. subcutaneous  Four Times A Day 07:30 AM, 11:30 AM, 04:30 PM, 09:00 PM  . [DISCONTINUED] QUEtiapine (SEROQUEL) 25 MG tablet Take 0.5 tablets (12.5 mg total) by mouth at bedtime.   No facility-administered encounter medications on file as of 05/31/2019.      SIGNIFICANT DIAGNOSTIC EXAMS   PREVIOUS;   05-09-19: chest x-ray: No acute disease   05-17-19: EEG: This study is suggestive of moderate diffuse encephalopathy, non specific to etiology.  No seizures or epileptiform discharges were seen throughout the recording.   05-17-19: ct of head: Examination is significantly limited by motion  artifact throughout. Within this limitation, no acute intracranial pathology. Small-vessel white matter disease.  NO NEW EXAMS.   LABS REVIEWED PREVIOUS:   05-09-19:wbc 7.7; hgb 8.8; hct 31.4; mcv 89.5 ;plt 343; glucose 193; bun 9; creat 0.63; k+ 3.4; na++ 140; ca 7.5; liver normal albumin 2.1; BNP 461.0; hgb a1c 8.8 05-11-19: wbc 5.9; hgb 8.2; hct 29.4; mcv 89.1 plt 338; glucose 279; bun 12; creat 0.70; k+ 3.9; na++ 139; ca 7.3; mag 1.2 05-16-19: wbc 9.9; hgb 9.0; hct 30.3; mcv 85.1 pl 371; glucose 93; bun 10; creat 0.90; k+ 3.3; na++ 139; ca 8.1; mag 1.2 tsh 1.071 05-19-19: glucose 79; bun 8; creat 0.85; k+ 3.7; na++ 140; ca 7.9; mag 1.7 05-23-19: glucose 157; bun 13; creat 0.89; k+ 3.8; na++ 140; ca 7.9   NO NEW LABS.    Review of Systems  Constitutional: Negative for malaise/fatigue.  Respiratory: Negative for cough and shortness of breath.   Cardiovascular: Negative for chest pain, palpitations and leg swelling.  Gastrointestinal: Positive for nausea. Negative for abdominal pain, constipation and heartburn.       Poor appetite   Musculoskeletal: Negative for back pain, joint pain and myalgias.  Skin: Negative.   Neurological: Negative for dizziness.  Psychiatric/Behavioral: The patient is not nervous/anxious.     Physical Exam Constitutional:      General: She is not in acute distress.    Appearance: She is well-developed. She is obese. She is not diaphoretic.  Neck:     Musculoskeletal: Neck supple.     Thyroid: No thyromegaly.  Cardiovascular:     Rate and Rhythm: Normal rate and regular rhythm.     Heart sounds: Normal heart sounds.  Pulmonary:     Effort: Pulmonary effort is normal. No  respiratory distress.     Breath sounds: Normal breath sounds.  Abdominal:     General: Bowel sounds are normal. There is no distension.     Palpations: Abdomen is soft.     Tenderness: There is no abdominal tenderness.  Musculoskeletal:     Right lower leg: No edema.     Left lower  leg: No edema.     Comments: Is able to move all extremities   Lymphadenopathy:     Cervical: No cervical adenopathy.  Skin:    General: Skin is warm and dry.     Comments: Abdominal incision line: open without signs of infection present 19.5 x 2.5 x 1.8 cm     Neurological:     Mental Status: She is alert and oriented to person, place, and time.  Psychiatric:        Mood and Affect: Mood normal.      ASSESSMENT/ PLAN:  TODAY  1. Uncontrolled diabetes type 2 with chronic kidney disease with long term current use of insuline 2. Abdominal wall ulcer with fat layer exposed 3. Nausea and vomiting  Will begin zofran 4 mg prior to meals Will stop seroquel at this time as she is no longer symptomatic Will change novolog to 6 units with meals Will increase lantus to 10 units nightly      MD is aware of resident's narcotic use and is in agreement with current plan of care. We will attempt to wean resident as appropriate.  Ok Edwards NP [name]  Contact [phone] Monday through Friday 8am- 5pm  After hours call [phone]

## 2019-06-01 ENCOUNTER — Encounter (HOSPITAL_COMMUNITY)
Admission: RE | Admit: 2019-06-01 | Discharge: 2019-06-01 | Disposition: A | Discharge status: Home / Self Care | Payer: BC Managed Care – PPO | Source: Skilled Nursing Facility | Attending: Internal Medicine | Admitting: Internal Medicine

## 2019-06-01 ENCOUNTER — Non-Acute Institutional Stay (SKILLED_NURSING_FACILITY): Payer: BC Managed Care – PPO | Admitting: Internal Medicine

## 2019-06-01 ENCOUNTER — Encounter: Payer: Self-pay | Admitting: Internal Medicine

## 2019-06-01 DIAGNOSIS — S31109D Unspecified open wound of abdominal wall, unspecified quadrant without penetration into peritoneal cavity, subsequent encounter: Secondary | ICD-10-CM

## 2019-06-01 DIAGNOSIS — Z794 Long term (current) use of insulin: Secondary | ICD-10-CM

## 2019-06-01 DIAGNOSIS — G9340 Encephalopathy, unspecified: Secondary | ICD-10-CM

## 2019-06-01 DIAGNOSIS — I4891 Unspecified atrial fibrillation: Secondary | ICD-10-CM | POA: Diagnosis not present

## 2019-06-01 DIAGNOSIS — N1831 Chronic kidney disease, stage 3a: Secondary | ICD-10-CM

## 2019-06-01 DIAGNOSIS — L88 Pyoderma gangrenosum: Secondary | ICD-10-CM | POA: Diagnosis not present

## 2019-06-01 DIAGNOSIS — E1165 Type 2 diabetes mellitus with hyperglycemia: Secondary | ICD-10-CM

## 2019-06-01 DIAGNOSIS — I130 Hypertensive heart and chronic kidney disease with heart failure and stage 1 through stage 4 chronic kidney disease, or unspecified chronic kidney disease: Secondary | ICD-10-CM | POA: Insufficient documentation

## 2019-06-01 DIAGNOSIS — E1122 Type 2 diabetes mellitus with diabetic chronic kidney disease: Secondary | ICD-10-CM

## 2019-06-01 DIAGNOSIS — J449 Chronic obstructive pulmonary disease, unspecified: Secondary | ICD-10-CM

## 2019-06-01 DIAGNOSIS — D631 Anemia in chronic kidney disease: Secondary | ICD-10-CM

## 2019-06-01 DIAGNOSIS — D508 Other iron deficiency anemias: Secondary | ICD-10-CM

## 2019-06-01 DIAGNOSIS — IMO0002 Reserved for concepts with insufficient information to code with codable children: Secondary | ICD-10-CM

## 2019-06-01 DIAGNOSIS — L089 Local infection of the skin and subcutaneous tissue, unspecified: Secondary | ICD-10-CM

## 2019-06-01 DIAGNOSIS — K219 Gastro-esophageal reflux disease without esophagitis: Secondary | ICD-10-CM

## 2019-06-01 DIAGNOSIS — L98492 Non-pressure chronic ulcer of skin of other sites with fat layer exposed: Secondary | ICD-10-CM | POA: Diagnosis not present

## 2019-06-01 LAB — COMPREHENSIVE METABOLIC PANEL
ALT: 13 U/L (ref 0–44)
AST: 12 U/L — ABNORMAL LOW (ref 15–41)
Albumin: 2 g/dL — ABNORMAL LOW (ref 3.5–5.0)
Alkaline Phosphatase: 90 U/L (ref 38–126)
Anion gap: 9 (ref 5–15)
BUN: 42 mg/dL — ABNORMAL HIGH (ref 8–23)
CO2: 24 mmol/L (ref 22–32)
Calcium: 8.3 mg/dL — ABNORMAL LOW (ref 8.9–10.3)
Chloride: 104 mmol/L (ref 98–111)
Creatinine, Ser: 1.35 mg/dL — ABNORMAL HIGH (ref 0.44–1.00)
GFR calc Af Amer: 47 mL/min — ABNORMAL LOW (ref >60)
GFR calc non Af Amer: 40 mL/min — ABNORMAL LOW (ref >60)
Glucose, Bld: 109 mg/dL — ABNORMAL HIGH (ref 70–99)
Potassium: 5.2 mmol/L — ABNORMAL HIGH (ref 3.5–5.1)
Sodium: 137 mmol/L (ref 135–145)
Total Bilirubin: 0.6 mg/dL (ref 0.3–1.2)
Total Protein: 5.6 g/dL — ABNORMAL LOW (ref 6.5–8.1)

## 2019-06-01 LAB — MAGNESIUM: Magnesium: 1.3 mg/dL — ABNORMAL LOW (ref 1.7–2.4)

## 2019-06-01 MED ORDER — HYDROCODONE-ACETAMINOPHEN 5-325 MG PO TABS: 1.0000 | ORAL_TABLET | Freq: Four times a day (QID) | ORAL | 0 refills | Status: DC

## 2019-06-01 NOTE — Progress Notes (Signed)
: Provider:  Hennie Duos., MD Location:  [place] Room Number: 158-P Place of Service:  SNF (31)  PCP: [name], MD Patient Care Team: [name], MD as PCP - General (Internal Medicine) Herminio Commons, MD as PCP - Cardiology (Cardiology) Michel Bickers, MD as Consulting Physician (Infectious Diseases) Derek Jack, MD as Consulting Physician (Hematology) Virl Cagey, MD as Consulting Physician (General Surgery)  Extended Emergency Contact Information Primary Emergency Contact: Plaut,John R Address: [address]          [place], [place] [address] Johnnette Litter of [place] Phone: [phone] Mobile Phone: [phone] Relation: Spouse     Allergies: Feraheme [ferumoxytol], Propranolol, Topamax [topiramate], and Latex  Chief Complaint  Patient presents with   Readmit To SNF    Readmission to [facility]    HPI: Patient is a 68 y.o. female with atrial fibrillation on anticoagulation with Eliquis, diabetes mellitus type 2, LV thrombus, hypertension, depression, abdominal wound infection currently on IV Invanz.  Patient was recently discharged from the hospital on 04/30/2019 after being admitted for abdominal wound infection.  And was discharged on Invanz for 21 days.  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  She was not discharged on p.o. prednisone on 04/30/2019 which is part of the problem.  Patient denied chest pain but complained of palpitations shortness of breath nausea but no vomiting or diarrhea.  In the ED patient was noted to be in atrial for with RVR and was started on IV Cardizem.  Patient was admitted to [facility] from 9/21-10/7 heart rate improved.  Hospital course was complicated by acute  encephalopathy which continued and worsened and required Seroquel at one point.  Patient also had multiple episodes of hypoglycemia and patient is Levemir was stopped and patient will be on sliding scale insulin until seen by endocrinology.  Patient is admitted back to skilled nursing facility for OT/PT.  While at skilled nursing facility patient will be followed for GERD treated with Protonix, COPD treated with Xopenex as needed and Spiriva Respimat 1 puff daily and anemia treated with iron  Past Medical History:  Diagnosis Date   Atrial fibrillation (HCC)    CHF (congestive heart failure) ([place])    Depression    Diabetes mellitus without complication ([place])    History of transesophageal echocardiography (TEE) 03/2018   LV thrombus   Hypertension    Morbid obesity ([place])    Osteoporosis    Urinary incontinence    UTI (lower urinary tract infection) 01/2016   Cipro for Klebsiella pneumoniae isolate   Wheelchair bound    bedbound    Past Surgical History:  Procedure Laterality Date   ABDOMINAL HYSTERECTOMY     APPLICATION OF WOUND VAC  03/09/2019   Procedure: APPLICATION OF WOUND VAC;  Surgeon: Virl Cagey, MD;  Location: [facility] OR[facility];  Service: General;;   BIOPSY  09/06/2018   Procedure: BIOPSY;  Surgeon: Danie Binder, MD;  Location: [facility] END[facility] SU[facility];  Service: Endoscopy;;  gastric bx's   CESAREAN SECTION     CHOLECYSTECTOMY     ESOPHAGEAL DILATION  09/06/2018   Procedure: ESOPHAGEAL DILATION;  Surgeon: Danie Binder, MD;  Location: [facility];  Service: Endoscopy;;   ESOPHAGOGASTRODUODENOSCOPY (EGD) WITH PROPOFOL N/A 09/06/2018   Procedure: ESOPHAGOGASTRODUODENOSCOPY (  EGD) WITH PROPOFOL;  Surgeon: Danie Binder, MD;  Location: [facility];  Service: Endoscopy;  Laterality: N/A;  dilatation   FEMUR IM NAIL Left 02/20/2016   FEMUR IM NAIL Left 02/20/2016   Procedure: INTRAMEDULLARY (IM) RETROGRADE FEMORAL NAILING;  Surgeon: Leandrew Koyanagi, MD;  Location: [place];   Service: Orthopedics;  Laterality: Left;   PANCREAS SURGERY  1967   1 cyst excised and one cyst drained   TEE WITHOUT CARDIOVERSION N/A 04/05/2018   Procedure: TRANSESOPHAGEAL ECHOCARDIOGRAM (TEE);  Surgeon: Dorothy Spark, MD;  Location: [place];  Service: Cardiovascular;  Laterality: N/A;   VEIN LIGATION AND STRIPPING     WOUND DEBRIDEMENT N/A 03/09/2019   Procedure: EXCISIONAL DEBRIDEMENT OF ABDOMINAL WOUND ULCERS;  Surgeon: Virl Cagey, MD;  Location: [facility];  Service: General;  Laterality: N/A;   WRIST FRACTURE SURGERY      Allergies as of 06/01/2019      Reactions   Feraheme [ferumoxytol] Other (See Comments)   Back pain (yelling out with back pain)   Propranolol Swelling   Pt states it may have been leg swelling   Topamax [topiramate] Other (See Comments)   hallucinations   Latex Itching, Rash      Medication List    Notice   This visit is during an admission. Changes to the med list made in this visit will be reflected in the After Visit Summary of the admission.    Current Outpatient Medications on File Prior to Visit  Medication Sig Dispense Refill   albuterol (PROVENTIL HFA;VENTOLIN HFA) 108 (90 Base) MCG/ACT inhaler Inhale 2 puffs into the lungs every 6 (six) hours as needed for wheezing or shortness of breath. 1 Inhaler 2   Amino Acids-Protein Hydrolys (FEEDING SUPPLEMENT, PRO-STAT SUGAR FREE 64,) LIQD Take 30 mLs by mouth 3 (three) times daily. 887 mL 0   apixaban (ELIQUIS) 5 MG TABS tablet Take 1 tablet (5 mg total) by mouth 2 (two) times daily. 60 tablet 3   diltiazem (CARDIZEM CD) 120 MG 24 hr capsule Take 1 capsule (120 mg total) by mouth daily.     ferrous sulfate (FERROUSUL) 325 (65 FE) MG tablet Take 1 tablet (325 mg total) by mouth 2 (two) times daily with a meal. 60 tablet 0   fluticasone (FLONASE) 50 MCG/ACT nasal spray Place 2 sprays into both nostrils daily as needed for allergies or rhinitis (congestion).      furosemide (LASIX)  20 MG tablet Take 1 tablet (20 mg total) by mouth daily. 30 tablet    insulin aspart (NOVOLOG PENFILL) cartridge Inject 6 Units into the skin 3 (three) times daily with meals.     Insulin Glargine (LANTUS SOLOSTAR) 100 UNIT/ML Solostar Pen Inject 10 Units into the skin at bedtime.     levalbuterol (XOPENEX) 0.63 MG/3ML nebulizer solution Take 0.63 mg by nebulization every 6 (six) hours as needed for wheezing or shortness of breath.      metoprolol tartrate (LOPRESSOR) 50 MG tablet Take 50 mg by mouth 2 (two) times daily.      NON FORMULARY Diet: Regular, Consistent Carbohydrate     nutrition supplement, JUVEN, (JUVEN) PACK Take 1 packet by mouth 2 (two) times daily between meals.  0   ondansetron (ZOFRAN) 4 MG tablet Take 4 mg by mouth. Before meals for nausea     pantoprazole (PROTONIX) 40 MG tablet Take 1 tablet (40 mg total) by mouth daily before breakfast.     potassium chloride SA (KLOR-CON)  20 MEQ tablet Take 2 tablets (40 mEq total) by mouth daily.     SPIRIVA RESPIMAT 1.25 MCG/ACT AERS Inhale 1 puff into the lungs daily.      No current facility-administered medications on file prior to visit.      Meds ordered this encounter  Medications   HYDROcodone-acetaminophen (NORCO) 5-325 MG tablet    Sig: Take 1 tablet by mouth every 6 (six) hours.    Dispense:  30 tablet    Refill:  0    Immunization History  Administered Date(s) Administered   Influenza, High Dose Seasonal PF 04/23/2018   PPD Test 02/22/2016    Social History   Tobacco Use   Smoking status: Never Smoker   Smokeless tobacco: Never Used  Substance Use Topics   Alcohol use: No    Family history is   Family History  Problem Relation Age of Onset   Diabetes Mother        died in her 47's of a stroke   Cancer Mother    Stroke Mother    Breast cancer Mother    Diabetes Father        died in his 35's of a stroke.   Stroke Father    Diabetes Brother        died @ 65 of a stroke.    Stroke Brother    Diabetes Maternal Grandmother    Diabetes Maternal Grandfather    Diabetes Paternal Grandmother    Diabetes Paternal Grandfather    Breast cancer Maternal Aunt       Review of Systems  DATA OBTAINED: from patient, nurse GENERAL:  no fevers, fatigue, appetite changes SKIN: No itching, or rash EYES: No eye pain, redness, discharge EARS: No earache, tinnitus, change in hearing NOSE: No congestion, drainage or bleeding  MOUTH/THROAT: No mouth or tooth pain, No sore throat RESPIRATORY: No cough, wheezing, SOB CARDIAC: No chest pain, palpitations, lower extremity edema  GI: No abdominal pain, No N/V/D or constipation, No heartburn or reflux; pain with abdominal incision GU: No dysuria, frequency or urgency, or incontinence  MUSCULOSKELETAL: No unrelieved bone/joint pain NEUROLOGIC: No headache, dizziness or focal weakness PSYCHIATRIC: No c/o anxiety or sadness   Vitals:   06/01/19 1057  BP: 132/80  Pulse: 86  Resp: 20  Temp: 98.2 F (36.8 C)    SpO2 Readings from Last 1 Encounters:  05/25/19 97%   Body mass index is 29.46 kg/m.     Physical Exam  GENERAL APPEARANCE: Alert, conversant,  No acute distress.  SKIN: No diaphoresis rash HEAD: Normocephalic, atraumatic  EYES: Conjunctiva/lids clear. Pupils round, reactive. EOMs intact.  EARS: External exam WNL, canals clear. Hearing grossly normal.  NOSE: No deformity or discharge.  MOUTH/THROAT: Lips w/o lesions  RESPIRATORY: Breathing is even, unlabored. Lung sounds are clear   CARDIOVASCULAR: Heart RRR no murmurs, rubs or gallops. No peripheral edema.   GASTROINTESTINAL: Abdomen is soft, non-tender, not distended w/ normal bowel sounds.;  Very long  horizontal incision with dressing; no redness swelling or heat GENITOURINARY: Bladder non tender, not distended  MUSCULOSKELETAL: No abnormal joints or musculature NEUROLOGIC:  Cranial nerves 2-12 grossly intact. Moves all extremities  PSYCHIATRIC:  Mood and affect appropriate to situation, no behavioral issues  Patient Active Problem List   Diagnosis Date Noted   Hypertension associated with type 2 diabetes mellitus ([place]) 05/31/2019   GERD without esophagitis 05/31/2019   CKD stage 3 due to type 2 diabetes mellitus ([place]) 05/31/2019   Anemia due  to stage 3a chronic kidney disease 05/31/2019   Protein-calorie malnutrition, severe ([place]) 05/31/2019   Acute encephalopathy 05/25/2019   Atrial fibrillation with tachycardic ventricular rate ([place]) 04/28/2019   Uncontrolled type 2 diabetes mellitus with chronic kidney disease, with long-term current use of insulin ([place]) 04/22/2019   Pyoderma gangrenosum 03/11/2019   Wound infection 03/09/2019   Chronic wound infection of abdomen    Wheelchair bound    Abdominal wall skin ulcer ([place]) 03/07/2019   Acute pulmonary edema ([place]) 02/17/2019   Iron deficiency anemia 02/01/2019   Abdominal wall ulcer, with fat layer exposed ([place]) 01/16/2019   Elevated troponin    Nausea and vomiting    Symptomatic bradycardia 01/10/2019   DKA (diabetic ketoacidoses) ([place]) 01/10/2019   Urinary incontinence 12/28/2018   Recurrent urinary tract infection 12/16/2018   Wound, open, anterior abdominal wall 12/05/2018   Hypotension due to hypovolemia    Gastritis due to nonsteroidal anti-inflammatory drug    Esophageal dysphagia    GI bleed 09/03/2018   Coagulopathy ([place])    Colitis    Lower abdominal pain    Rectal bleeding    Dehydration 08/16/2018   Acute on chronic diastolic CHF (congestive heart failure) ([place]) 08/10/2018   Acute lower UTI 08/10/2018   Hypokalemia 08/10/2018   COPD (chronic obstructive pulmonary disease) ([place]) 08/10/2018   Thyroid lesion 08/10/2018   Closed fracture of right ankle 06/10/2018   Closed fracture of left tibia and fibula with routine healing 04/13/18 06/10/2018   Atrial fibrillation ([place]) 04/15/2018   Chronic diastolic HF (heart  failure) ([place]) 04/15/2018   CKD (chronic kidney disease), stage III 04/15/2018   Left tibial fracture 04/14/2018   CHF exacerbation ([place]) 04/10/2018   SOB (shortness of breath)    Acute CHF (congestive heart failure) ([place]) 04/01/2018   Atrial fibrillation with RVR ([place]) 04/01/2018   [place] 03/11/2016   Muscle spasms of lower extremity 03/04/2016   Acute renal insufficiency 02/26/2016   History of MRSA infection 02/26/2016   HTN (hypertension) 02/19/2016   Fracture, femur, distal ([place]) 02/19/2016   Fall 02/19/2016   Depression 02/19/2016   Femur fracture, left ([place]) 02/19/2016      Labs reviewed: Basic Metabolic Panel:    Component Value Date/Time   NA 137 06/01/2019 0745   K 5.2 (H) 06/01/2019 0745   CL 104 06/01/2019 0745   CO2 24 06/01/2019 0745   GLUCOSE 109 (H) 06/01/2019 0745   BUN 42 (H) 06/01/2019 0745   CREATININE 1.35 (H) 06/01/2019 0745   CREATININE 0.81 01/25/2019 1354   CALCIUM 8.3 (L) 06/01/2019 0745   PROT 5.6 (L) 06/01/2019 0745   ALBUMIN 2.0 (L) 06/01/2019 0745   AST 12 (L) 06/01/2019 0745   AST 7 (L) 01/25/2019 1354   ALT 13 06/01/2019 0745   ALT <6 01/25/2019 1354   ALKPHOS 90 06/01/2019 0745   BILITOT 0.6 06/01/2019 0745   BILITOT 0.7 01/25/2019 1354   GFRNONAA 40 (L) 06/01/2019 0745   GFRNONAA >60 01/25/2019 1354   GFRAA 47 (L) 06/01/2019 0745   GFRAA >60 01/25/2019 1354    Recent Labs    05/18/19 0451 05/19/19 0500 05/23/19 0655 06/01/19 0745  NA 138 140 140 137  K 3.0* 3.7 3.8 5.2*  CL 97* 102 101 104  CO2 29 27 28 24   GLUCOSE 79 79 157* 109*  BUN 8 8 13  42*  CREATININE 0.91 0.85 0.89 1.35*  CALCIUM 7.6* 7.9* 7.9* 8.3*  MG 1.8 1.7  --  1.3*  Liver Function Tests: Recent Labs    05/09/19 1744 05/10/19 0249 06/01/19 0745  AST 13* 14* 12*  ALT 15 13 13   ALKPHOS 94 88 90  BILITOT 0.9 0.8 0.6  PROT 5.3* 4.9* 5.6*  ALBUMIN 2.1* 2.0* 2.0*   Recent Labs    01/10/19 1646  LIPASE 17   Recent Labs     08/19/18 1026 05/16/19 1135  AMMONIA <9* 16   CBC: Recent Labs    04/27/19 2027  05/01/19 1430 05/09/19 1744  05/17/19 0613 05/18/19 0451 05/20/19 0502  WBC 14.8*   < > 14.6* 7.7   < > 10.8* 9.5 8.1  NEUTROABS 13.2*  --  13.0* 6.0  --   --   --   --   HGB 10.3*   < > 9.3* 8.8*   < > 10.0* 9.5* 10.2*  HCT 37.0   < > 31.5* 31.4*   < > 34.6* 32.8* 36.1  MCV 91.4   < > 87.0 89.5   < > 87.2 87.9 89.1  PLT 435*   < > 445* 343   < > 421* 402* 405*   < > = values in this interval not displayed.   Lipid No results for input(s): CHOL, HDL, LDLCALC, TRIG in the last 8760 hours.  Cardiac Enzymes: Recent Labs    08/16/18 1848 01/10/19 1646 01/10/19 2020  TROPONINI <0.03 0.29* 0.25*   BNP: Recent Labs    01/10/19 1646 02/17/19 1522 05/09/19 1750  BNP 2,107.0* 1,510.0* 461.0*   No results found for: [facility] Lab Results  Component Value Date   HGBA1C 8.8 (H) 05/09/2019   Lab Results  Component Value Date   TSH 1.071 05/16/2019   Lab Results  Component Value Date   VITAMINB12 299 01/25/2019   Lab Results  Component Value Date   FOLATE 9.8 02/10/2019   Lab Results  Component Value Date   IRON 11 (L) 01/12/2019   TIBC 343 01/12/2019   FERRITIN 33 01/12/2019    Imaging and Procedures obtained prior to SNF admission: No results found.   Not all labs, radiology exams or other studies done during hospitalization come through on my EPIC note; however they are reviewed by me.    Assessment and Plan  Acute encephalopathy-unknown cause; to be delirium and treated with Seroquel but likely due to polypharmacy; Wellbutrin and Lexapro were DC'd DC oxycodone as patient had not received any doses in 72 hours ammonia, TSH within normal limits ABG, no hypercarbia UA showed some WBCs but no culture was done and patient had just finished 3 weeks of ertapenem for abdominal wound SNF-patient admitted for OT/PT continue Seroquel 12.5 mg nightly  Atrial fibrillation with  RVR-responded to IV Cardizem which was then transition to p.o. Cardizem SNF-continue Cardizem 120 mg daily, metoprolol 50 mg twice daily and prophylaxed with Eliquis five mg twice daily  Chronic abdominal wound/pyoderma gangrenosum-which was surgically removed; finished IV IV units 3 weeks on 10/3; steroid therapy was stopped which was undoubtedly hurting the situation SNF-continue wound care continue Norco for pain  Diabetes mellitus type 2-labile blood glucose [place] readings it has resolved; A1c 8.8 plan is to discharge patient on sliding scale insulin until she can follow-up with endocrinology; did have problems with Levemir and hypoglycemia SNF-continue sliding scale insulin until follow-up with endocrinology  COPD SNF-continue Spiriva Respimat 1 puff daily and as needed Xopenex  GERD SNF-not stated as uncontrolled; continue Protonix 40 mg daily  Anemia SNF-DC hemoglobin 10.2; follow-up CBC continue iron  325 mg twice daily    Time spent greater than 45 minutes;> 50% of time with patient was spent reviewing records, labs, tests and studies, counseling and developing plan of care  Hennie Duos, MD

## 2019-06-02 ENCOUNTER — Encounter: Payer: Self-pay | Admitting: Adult Health

## 2019-06-02 ENCOUNTER — Non-Acute Institutional Stay (SKILLED_NURSING_FACILITY): Payer: BC Managed Care – PPO | Admitting: Adult Health

## 2019-06-02 DIAGNOSIS — I10 Essential (primary) hypertension: Secondary | ICD-10-CM

## 2019-06-02 DIAGNOSIS — I5033 Acute on chronic diastolic (congestive) heart failure: Secondary | ICD-10-CM | POA: Diagnosis not present

## 2019-06-02 DIAGNOSIS — R1114 Bilious vomiting: Secondary | ICD-10-CM | POA: Diagnosis not present

## 2019-06-02 DIAGNOSIS — E1159 Type 2 diabetes mellitus with other circulatory complications: Secondary | ICD-10-CM

## 2019-06-02 DIAGNOSIS — I4891 Unspecified atrial fibrillation: Secondary | ICD-10-CM

## 2019-06-02 NOTE — Progress Notes (Signed)
Location:    [place] Room Number: 158/P Place of Service:  SNF (31)   CODE STATUS: Full Code  Allergies  Allergen Reactions  . Feraheme [Ferumoxytol] Other (See Comments)    Back pain (yelling out with back pain)  . Propranolol Swelling    Pt states it may have been leg swelling  . Topamax [Topiramate] Other (See Comments)    hallucinations  . Latex Itching and Rash   Chief Complaint  Patient presents with  . Medical Management of Chronic Issues         Acute on chronic diastolic CHF (congestive heart failure)  . Hypertension associated with type 2 diabetes mellitus:    Atrial fibrillation with RVR    Weekly follow up for the first 30 days post hospitalization.     HPI:  She is a 68 year old short term rehab patient being seen for the management of her chronic illnesses: chf; hypertension afib. She does have nausea present. Her pain is presently being managed. She has a poor appetite. There are no reports of fevers present. She denies any cough or shortness of breath.   Past Medical History:  Diagnosis Date  . Atrial fibrillation ([place])   . CHF (congestive heart failure) ([place])   . Depression   . Diabetes mellitus without complication ([place])   . 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 Hypertension   . Morbid obesity ([place])   . Osteoporosis   . Urinary incontinence   . UTI (lower urinary tract infection) 01/2016   Cipro for Klebsiella pneumoniae isolate  . Wheelchair bound    bedbound    Past Surgical History:  Procedure Laterality Date  . ABDOMINAL HYSTERECTOMY    . APPLICATION OF WOUND VAC  03/09/2019   Procedure: APPLICATION OF WOUND VAC;  Surgeon: Virl Cagey, MD;  Location: [facility];  Service: General;;  . BIOPSY  09/06/2018   Procedure: BIOPSY;  Surgeon: Danie Binder, MD;  Location: [facility];  Service: Endoscopy;;  gastric bx's  . CESAREAN SECTION    . CHOLECYSTECTOMY    . ESOPHAGEAL  DILATION  09/06/2018   Procedure: ESOPHAGEAL DILATION;  Surgeon: Danie Binder, MD;  [facility];  Service: Endoscopy;;  . ESOPHAGOGASTRODUODENOSCOPY (EGD) WITH PROPOFOL N/A 09/06/2018   Procedure: ESOPHAGOGASTRODUODENOSCOPY (EGD) WITH PROPOFOL;  Surgeon: Danie Binder, MD;  [facility];  Service: Endoscopy;  Laterality: N/A;  dilatation  . FEMUR IM NAIL Left 02/20/2016  . FEMUR IM NAIL Left 02/20/2016   Procedure: INTRAMEDULLARY (IM) RETROGRADE FEMORAL NAILING;  Surgeon: Leandrew Koyanagi, MD;  Location: [facility];  Service: Orthopedics;  Laterality: Left;  . PANCREAS SURGERY  1967   1 cyst excised and one cyst drained  . TEE WITHOUT CARDIOVERSION N/A 04/05/2018   Procedure: TRANSESOPHAGEAL ECHOCARDIOGRAM (TEE);  Surgeon: Dorothy Spark, MD;  Location: [place];  Service: Cardiovascular;  Laterality: N/A;  . [place]    . WOUND DEBRIDEMENT N/A 03/09/2019   Procedure: EXCISIONAL DEBRIDEMENT OF ABDOMINAL WOUND ULCERS;  Surgeon: Virl Cagey, MD;  Location: [facility];  Service: General;  Laterality: N/A;  . WRIST FRACTURE SURGERY      Social History   Socioeconomic History  . Marital status: Married    Spouse name: 1  . Number of children: Not on file  . Years of education: Not on file  . Highest education level: Not on file  Occupational  History  . Occupation: retired  Scientific laboratory technician  . Financial resource strain: Not on file  . Food insecurity    Worry: Not on file    Inability: Not on file  . Transportation needs    Medical: No    Non-medical: No  Tobacco Use  . Smoking status: Never Smoker  . Smokeless tobacco: Never Used  Substance and Sexual Activity  . Alcohol use: No  . Drug use: No  . Sexual activity: Not on file  Lifestyle  . Physical activity    Days per week: Not on file    Minutes per session: Not on file  . Stress: Not on file  Relationships  . Social Herbalist on phone: Not on file    Gets together: Not  on file    Attends religious service: Not on file    Active member of club or organization: Not on file    Attends meetings of clubs or organizations: Not on file    Relationship status: Not on file  . Intimate partner violence    Fear of current or ex partner: Not on file    Emotionally abused: Not on file    Physically abused: Not on file    Forced sexual activity: Not on file  Other Topics Concern  . Not on file  Social History Narrative   Lives in [place] with husband.  More or less w/c bound since leg fx about 3 yrs ago.   Family History  Problem Relation Age of Onset  . Diabetes Mother        died in her 49's of a stroke  . Cancer Mother   . Stroke Mother   . Breast cancer Mother   . Diabetes Father        died in his 18's of a stroke.  . Stroke Father   . Diabetes Brother        died @ 27 of a stroke.  . Stroke Brother   . Diabetes Maternal Grandmother   . Diabetes Maternal Grandfather   . Diabetes Paternal Grandmother   . Diabetes Paternal Grandfather   . Breast cancer Maternal Aunt       VITAL SIGNS BP 126/84   Pulse 76   Temp 98.4 F (36.9 C) (Oral)   Resp 18   Ht 5' 4"  (1.626 m)   Wt 171 lb 9.6 oz (77.8 kg)   BMI 29.46 kg/m   Outpatient Encounter Medications as of 06/02/2019  Medication Sig  . albuterol (PROVENTIL HFA;VENTOLIN HFA) 108 (90 Base) MCG/ACT inhaler Inhale 2 puffs into the lungs every 6 (six) hours as needed for wheezing or shortness of breath.  . Amino Acids-Protein Hydrolys (FEEDING SUPPLEMENT, PRO-STAT SUGAR FREE 64,) LIQD Take 30 mLs by mouth 3 (three) times daily.  Marland Kitchen apixaban (ELIQUIS) 5 MG TABS tablet Take 1 tablet (5 mg total) by mouth 2 (two) times daily.  Marland Kitchen diltiazem (CARDIZEM CD) 120 MG 24 hr capsule Take 1 capsule (120 mg total) by mouth daily.  . ferrous sulfate (FERROUSUL) 325 (65 FE) MG tablet Take 1 tablet (325 mg total) by mouth 2 (two) times daily with a meal.  . fluticasone (FLONASE) 50 MCG/ACT nasal spray Place 2  sprays into both nostrils daily as needed for allergies or rhinitis (congestion).   . furosemide (LASIX) 20 MG tablet Take 1 tablet (20 mg total) by mouth daily.  Marland Kitchen HYDROcodone-acetaminophen (NORCO) 5-325 MG tablet Take 1 tablet by mouth every 6 (  six) hours.  . insulin aspart (NOVOLOG PENFILL) cartridge Inject 6 Units into the skin 3 (three) times daily with meals.  . Insulin Glargine (LANTUS SOLOSTAR) 100 UNIT/ML Solostar Pen Inject 10 Units into the skin at bedtime.  . levalbuterol (XOPENEX) 0.63 MG/3ML nebulizer solution Take 0.63 mg by nebulization every 6 (six) hours as needed for wheezing or shortness of breath.   . metoprolol tartrate (LOPRESSOR) 50 MG tablet Take 50 mg by mouth 2 (two) times daily.   . NON FORMULARY Diet: Regular, Consistent Carbohydrate  . nutrition supplement, JUVEN, (JUVEN) PACK Take 1 packet by mouth 2 (two) times daily between meals.  . ondansetron (ZOFRAN) 4 MG tablet Take 4 mg by mouth. Before meals for nausea  . pantoprazole (PROTONIX) 40 MG tablet Take 1 tablet (40 mg total) by mouth daily before breakfast.  . potassium chloride SA (KLOR-CON) 20 MEQ tablet Take 2 tablets (40 mEq total) by mouth daily.  Marland Kitchen SPIRIVA RESPIMAT 1.25 MCG/ACT AERS Inhale 1 puff into the lungs daily.    No facility-administered encounter medications on file as of 06/02/2019.      SIGNIFICANT DIAGNOSTIC EXAMS   PREVIOUS;   05-09-19: chest x-ray: No acute disease   05-17-19: EEG: This study is suggestive of moderate diffuse encephalopathy, non specific to etiology.  No seizures or epileptiform discharges were seen throughout the recording.   05-17-19: ct of head: Examination is significantly limited by motion artifact throughout. Within this limitation, no acute intracranial pathology. Small-vessel white matter disease.  NO NEW EXAMS.   LABS REVIEWED PREVIOUS:   05-09-19:wbc 7.7; hgb 8.8; hct 31.4; mcv 89.5 ;plt 343; glucose 193; bun 9; creat 0.63; k+ 3.4; na++ 140; ca 7.5; liver  normal albumin 2.1; BNP 461.0; hgb a1c 8.8 05-11-19: wbc 5.9; hgb 8.2; hct 29.4; mcv 89.1 plt 338; glucose 279; bun 12; creat 0.70; k+ 3.9; na++ 139; ca 7.3; mag 1.2 05-16-19: wbc 9.9; hgb 9.0; hct 30.3; mcv 85.1 pl 371; glucose 93; bun 10; creat 0.90; k+ 3.3; na++ 139; ca 8.1; mag 1.2 tsh 1.071 05-19-19: glucose 79; bun 8; creat 0.85; k+ 3.7; na++ 140; ca 7.9; mag 1.7 05-23-19: glucose 157; bun 13; creat 0.89; k+ 3.8; na++ 140; ca 7.9   NO NEW LABS.   Review of Systems  Constitutional: Negative for malaise/fatigue.  Respiratory: Negative for cough and shortness of breath.   Cardiovascular: Negative for chest pain, palpitations and leg swelling.  Gastrointestinal: Positive for vomiting. Negative for abdominal pain, constipation and heartburn.  Musculoskeletal: Negative for back pain, joint pain and myalgias.  Skin: Negative.   Neurological: Negative for dizziness.  Psychiatric/Behavioral: The patient is not nervous/anxious.       Physical Exam Constitutional:      General: She is not in acute distress.    Appearance: She is well-developed. She is obese. She is not diaphoretic.  Neck:     Musculoskeletal: Neck supple.     Thyroid: No thyromegaly.  Cardiovascular:     Rate and Rhythm: Normal rate and regular rhythm.     Pulses: Normal pulses.     Heart sounds: Normal heart sounds.  Pulmonary:     Effort: Pulmonary effort is normal. No respiratory distress.     Breath sounds: Normal breath sounds.  Abdominal:     General: Bowel sounds are normal. There is no distension.     Palpations: Abdomen is soft.     Tenderness: There is no abdominal tenderness.  Musculoskeletal:     Right lower leg:  No edema.     Left lower leg: No edema.     Comments: Able to move all extremities   Lymphadenopathy:     Cervical: No cervical adenopathy.  Skin:    General: Skin is warm and dry.     Comments: Abdominal incision line: open without signs of infection present 19.5 x 2.5 x 1.8 cm       Neurological:     Mental Status: She is alert and oriented to person, place, and time.  Psychiatric:        Mood and Affect: Mood normal.     ASSESSMENT/ PLAN:  TODAY;   1. Acute on chronic diastolic CHF (congestive heart failure) is stable will continue lasix 20 mg daily with k+ 40 meq daily   2. Hypertension associated with type 2 diabetes mellitus: is stable b/p 126/84 will continue lopressor 50 mg twice daily and cardizem cd 120 mg daily   3. Atrial fibrillation with RVR heart rate is stable will continue lopressor 50 mg twice daily and cardizem cd 120 mg daily for rate control will continue eliquis 5 mg twice daily   4. Nausea and vomiting: is worse will begin zofran 4 mg prior to meals. Will monitor     PREVIOUS   5. Chronic obstructive pulmonary disease unspecified copd type: is stable spiriva respimet 125 mcg 1 puff daily xopenex 0.63 mg every 6 hours as needed; albuterol 2 puff every 6 hours as needed; flonase daily as needed  6. GERD without esophagitis: is stable will continue protonix 40 mg daily   7. CKD stage 3 due to type 2 diabetes mellitus: is stable bun 13 creat 0.89; will monitor  8. Uncontrolled type 2 diabetes mellitus with chronic kidney disease with lung term current use of insulin: is without change: hgb a1c 8.8; will continue novolog 6 units with meals and lantus 10 units daily   9. Anemia due to stage 3a chronic kidney disease: is stable hgb 9.0 will continue iron daily   10. Protein calorie malnutrition/pyoderma gangrenosum: albumin 2.1; will continue prostat 30 cc three time daily and juven twice daily   11. Hypokalemia: k+ 3.8 will continue k+ 40 meq daily  12. Acute delirium: is presently stable will continue seroquel 12.5 mg nightly   13. Surgical incision pain: is managed will continue vicodin 5/325 mg every 6 hours    MD is aware of resident's narcotic use and is in agreement with current plan of care. We will attempt to wean resident as  appropriate.  Ok Edwards NP [facility]  Contact [phone] Monday through Friday 8am- 5pm  After hours call [phone]

## 2019-06-03 DIAGNOSIS — R410 Disorientation, unspecified: Secondary | ICD-10-CM | POA: Insufficient documentation

## 2019-06-05 ENCOUNTER — Encounter: Payer: Self-pay | Admitting: Internal Medicine

## 2019-06-07 ENCOUNTER — Encounter: Payer: Self-pay | Admitting: Adult Health

## 2019-06-07 ENCOUNTER — Non-Acute Institutional Stay (SKILLED_NURSING_FACILITY): Payer: BC Managed Care – PPO | Admitting: Adult Health

## 2019-06-07 ENCOUNTER — Other Ambulatory Visit (HOSPITAL_COMMUNITY)
Admission: RE | Admit: 2019-06-07 | Discharge: 2019-06-07 | Disposition: A | Discharge status: Home / Self Care | Payer: BC Managed Care – PPO | Source: Skilled Nursing Facility | Attending: Adult Health | Admitting: Adult Health

## 2019-06-07 DIAGNOSIS — K5909 Other constipation: Secondary | ICD-10-CM

## 2019-06-07 DIAGNOSIS — N1832 Chronic kidney disease, stage 3b: Secondary | ICD-10-CM

## 2019-06-07 DIAGNOSIS — E875 Hyperkalemia: Secondary | ICD-10-CM

## 2019-06-07 DIAGNOSIS — I130 Hypertensive heart and chronic kidney disease with heart failure and stage 1 through stage 4 chronic kidney disease, or unspecified chronic kidney disease: Secondary | ICD-10-CM | POA: Insufficient documentation

## 2019-06-07 DIAGNOSIS — N179 Acute kidney failure, unspecified: Secondary | ICD-10-CM | POA: Diagnosis not present

## 2019-06-07 DIAGNOSIS — N390 Urinary tract infection, site not specified: Secondary | ICD-10-CM

## 2019-06-07 LAB — COMPREHENSIVE METABOLIC PANEL
ALT: 14 U/L (ref 0–44)
AST: 27 U/L (ref 15–41)
Albumin: 2 g/dL — ABNORMAL LOW (ref 3.5–5.0)
Alkaline Phosphatase: 132 U/L — ABNORMAL HIGH (ref 38–126)
Anion gap: 14 (ref 5–15)
BUN: 65 mg/dL — ABNORMAL HIGH (ref 8–23)
CO2: 16 mmol/L — ABNORMAL LOW (ref 22–32)
Calcium: 7.9 mg/dL — ABNORMAL LOW (ref 8.9–10.3)
Chloride: 101 mmol/L (ref 98–111)
Creatinine, Ser: 2.24 mg/dL — ABNORMAL HIGH (ref 0.44–1.00)
GFR calc Af Amer: 25 mL/min — ABNORMAL LOW (ref >60)
GFR calc non Af Amer: 22 mL/min — ABNORMAL LOW (ref >60)
Glucose, Bld: 305 mg/dL — ABNORMAL HIGH (ref 70–99)
Potassium: 6.5 mmol/L (ref 3.5–5.1)
Sodium: 131 mmol/L — ABNORMAL LOW (ref 135–145)
Total Bilirubin: 0.6 mg/dL (ref 0.3–1.2)
Total Protein: 5.6 g/dL — ABNORMAL LOW (ref 6.5–8.1)

## 2019-06-07 LAB — CBC WITH DIFFERENTIAL/PLATELET
Abs Immature Granulocytes: 0.4 10*3/uL — ABNORMAL HIGH (ref 0.00–0.07)
Basophils Absolute: 0.1 10*3/uL (ref 0.0–0.1)
Basophils Relative: 1 %
Eosinophils Absolute: 0 10*3/uL (ref 0.0–0.5)
Eosinophils Relative: 0 %
HCT: 37.4 % (ref 36.0–46.0)
Hemoglobin: 10.6 g/dL — ABNORMAL LOW (ref 12.0–15.0)
Immature Granulocytes: 3 %
Lymphocytes Relative: 11 %
Lymphs Abs: 1.7 10*3/uL (ref 0.7–4.0)
MCH: 25.9 pg — ABNORMAL LOW (ref 26.0–34.0)
MCHC: 28.3 g/dL — ABNORMAL LOW (ref 30.0–36.0)
MCV: 91.4 fL (ref 80.0–100.0)
Monocytes Absolute: 0.9 10*3/uL (ref 0.1–1.0)
Monocytes Relative: 6 %
Neutro Abs: 12 10*3/uL — ABNORMAL HIGH (ref 1.7–7.7)
Neutrophils Relative %: 79 %
Platelets: 526 10*3/uL — ABNORMAL HIGH (ref 150–400)
RBC: 4.09 MIL/uL (ref 3.87–5.11)
RDW: 18.5 % — ABNORMAL HIGH (ref 11.5–15.5)
WBC: 15.1 10*3/uL — ABNORMAL HIGH (ref 4.0–10.5)
nRBC: 0 % (ref 0.0–0.2)

## 2019-06-07 LAB — MAGNESIUM: Magnesium: 1.5 mg/dL — ABNORMAL LOW (ref 1.7–2.4)

## 2019-06-07 NOTE — Progress Notes (Deleted)
Location:    [place] Room Number: 158/P Place of Service:  SNF (31)   CODE STATUS: Full Code  Allergies  Allergen Reactions  . Feraheme [Ferumoxytol] Other (See Comments)    Back pain (yelling out with back pain)  . Propranolol Swelling    Pt states it may have been leg swelling  . Topamax [Topiramate] Other (See Comments)    hallucinations  . Latex Itching and Rash    Chief Complaint  Patient presents with  . Acute Visit    Status    HPI:    Past Medical History:  Diagnosis Date  . Atrial fibrillation ([place])   . CHF (congestive heart failure) ([place])   . Depression   . Diabetes mellitus without complication ([place])   . 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 Hypertension   . Morbid obesity ([place])   . Osteoporosis   . Urinary incontinence   . UTI (lower urinary tract infection) 01/2016   Cipro for Klebsiella pneumoniae isolate  . Wheelchair bound    bedbound    Past Surgical History:  Procedure Laterality Date  . ABDOMINAL HYSTERECTOMY    . APPLICATION OF WOUND VAC  03/09/2019   Procedure: APPLICATION OF WOUND VAC;  Surgeon: Virl Cagey, MD;  Location: [facility];  Service: General;;  . BIOPSY  09/06/2018   Procedure: BIOPSY;  Surgeon: Danie Binder, MD;  [facility];  Service: Endoscopy;;  gastric bx's  . CESAREAN SECTION    . CHOLECYSTECTOMY    . ESOPHAGEAL DILATION  09/06/2018   Procedure: ESOPHAGEAL DILATION;  Surgeon: Danie Binder, MD;  [facility];  Service: Endoscopy;;  . ESOPHAGOGASTRODUODENOSCOPY (EGD) WITH PROPOFOL N/A 09/06/2018   Procedure: ESOPHAGOGASTRODUODENOSCOPY (EGD) WITH PROPOFOL;  Surgeon: Danie Binder, MD;  [facility];  Service: Endoscopy;  Laterality: N/A;  dilatation  . FEMUR IM NAIL Left 02/20/2016  . FEMUR IM NAIL Left 02/20/2016   Procedure: INTRAMEDULLARY (IM) RETROGRADE FEMORAL NAILING;  Surgeon: Leandrew Koyanagi, MD;  Location: [place];   Service: Orthopedics;  Laterality: Left;  . PANCREAS SURGERY  1967   1 cyst excised and one cyst drained  . TEE WITHOUT CARDIOVERSION N/A 04/05/2018   Procedure: TRANSESOPHAGEAL ECHOCARDIOGRAM (TEE);  Surgeon: Dorothy Spark, MD;  Location: [place];  Service: Cardiovascular;  Laterality: N/A;  . [place]    . WOUND DEBRIDEMENT N/A 03/09/2019   Procedure: EXCISIONAL DEBRIDEMENT OF ABDOMINAL WOUND ULCERS;  Surgeon: Virl Cagey, MD;  Location: [facility];  Service: General;  Laterality: N/A;  . WRIST FRACTURE SURGERY      Social History   Socioeconomic History  . Marital status: Married    Spouse name: 1  . Number of children: Not on file  . Years of education: Not on file  . Highest education level: Not on file  Occupational History  . Occupation: retired  Scientific laboratory technician  . Financial resource strain: Not on file  . Food insecurity    Worry: Not on file    Inability: Not on file  . Transportation needs    Medical: No    Non-medical: No  Tobacco Use  . Smoking status: Never Smoker  . Smokeless tobacco: Never Used  Substance and Sexual Activity  . Alcohol use: No  . Drug use: No  . Sexual activity: Not on file  Lifestyle  . Physical activity    Days per week:  Not on file    Minutes per session: Not on file  . Stress: Not on file  Relationships  . Social Herbalist on phone: Not on file    Gets together: Not on file    Attends religious service: Not on file    Active member of club or organization: Not on file    Attends meetings of clubs or organizations: Not on file    Relationship status: Not on file  . Intimate partner violence    Fear of current or ex partner: Not on file    Emotionally abused: Not on file    Physically abused: Not on file    Forced sexual activity: Not on file  Other Topics Concern  . Not on file  Social History Narrative   Lives in [place] with husband.  More or less w/c bound since leg fx about 3 yrs  ago.   Family History  Problem Relation Age of Onset  . Diabetes Mother        died in her 51's of a stroke  . Cancer Mother   . Stroke Mother   . Breast cancer Mother   . Diabetes Father        died in his 2's of a stroke.  . Stroke Father   . Diabetes Brother        died @ 35 of a stroke.  . Stroke Brother   . Diabetes Maternal Grandmother   . Diabetes Maternal Grandfather   . Diabetes Paternal Grandmother   . Diabetes Paternal Grandfather   . Breast cancer Maternal Aunt       VITAL SIGNS BP 99/74   Pulse (!) 52   Temp (!) 97 F (36.1 C) (Oral)   Resp 20   Ht 5' 4"  (1.626 m)   Wt 172 lb 4.8 oz (78.2 kg)   BMI 29.58 kg/m   Outpatient Encounter Medications as of 06/07/2019  Medication Sig  . albuterol (PROVENTIL HFA;VENTOLIN HFA) 108 (90 Base) MCG/ACT inhaler Inhale 2 puffs into the lungs every 6 (six) hours as needed for wheezing or shortness of breath.  . Amino Acids-Protein Hydrolys (FEEDING SUPPLEMENT, PRO-STAT SUGAR FREE 64,) LIQD Take 30 mLs by mouth 3 (three) times daily.  Marland Kitchen apixaban (ELIQUIS) 5 MG TABS tablet Take 1 tablet (5 mg total) by mouth 2 (two) times daily.  . bisacodyl (DULCOLAX) 10 MG suppository Place 10 mg rectally as needed for moderate constipation.  Marland Kitchen diltiazem (CARDIZEM CD) 120 MG 24 hr capsule Take 1 capsule (120 mg total) by mouth daily.  . ferrous sulfate (FERROUSUL) 325 (65 FE) MG tablet Take 1 tablet (325 mg total) by mouth 2 (two) times daily with a meal.  . fluticasone (FLONASE) 50 MCG/ACT nasal spray Place 2 sprays into both nostrils daily as needed for allergies or rhinitis (congestion).   . furosemide (LASIX) 20 MG tablet Take 1 tablet (20 mg total) by mouth daily.  Marland Kitchen HYDROcodone-acetaminophen (NORCO) 5-325 MG tablet Take 1 tablet by mouth every 6 (six) hours.  . insulin aspart (NOVOLOG PENFILL) cartridge Inject 6 Units into the skin 3 (three) times daily with meals.  . Insulin Glargine (LANTUS SOLOSTAR) 100 UNIT/ML Solostar Pen Inject  10 Units into the skin at bedtime.  . levalbuterol (XOPENEX) 0.63 MG/3ML nebulizer solution Take 0.63 mg by nebulization every 6 (six) hours as needed for wheezing or shortness of breath.   . metoprolol tartrate (LOPRESSOR) 50 MG tablet Take 50 mg by mouth 2 (  two) times daily.   . NON FORMULARY Diet: Clear liquid diet x 24 hours  . nutrition supplement, JUVEN, (JUVEN) PACK Take 1 packet by mouth 2 (two) times daily between meals.  . ondansetron (ZOFRAN) 4 MG tablet Take 4 mg by mouth. Before meals for nausea  . pantoprazole (PROTONIX) 40 MG tablet Take 1 tablet (40 mg total) by mouth daily before breakfast.  . potassium chloride SA (KLOR-CON) 20 MEQ tablet Take 2 tablets (40 mEq total) by mouth daily.  Marland Kitchen SPIRIVA RESPIMAT 1.25 MCG/ACT AERS Inhale 1 puff into the lungs daily.    No facility-administered encounter medications on file as of 06/07/2019.      SIGNIFICANT DIAGNOSTIC EXAMS       ASSESSMENT/ PLAN:    MD is aware of resident's narcotic use and is in agreement with current plan of care. We will attempt to wean resident as appropriate.  Ok Edwards NP [facility]  Contact [phone] Monday through Friday 8am- 5pm  After hours call [phone]

## 2019-06-08 ENCOUNTER — Emergency Department (HOSPITAL_COMMUNITY): Payer: BC Managed Care – PPO

## 2019-06-08 ENCOUNTER — Inpatient Hospital Stay (HOSPITAL_COMMUNITY)
Admission: EM | Admit: 2019-06-08 | Discharge: 2019-06-17 | DRG: 177 | Disposition: A | Discharge status: Skilled Nursing Facility | Payer: BC Managed Care – PPO | Source: Skilled Nursing Facility | Attending: Internal Medicine | Admitting: Internal Medicine

## 2019-06-08 ENCOUNTER — Other Ambulatory Visit: Payer: Self-pay | Admitting: Adult Health

## 2019-06-08 ENCOUNTER — Encounter: Payer: Self-pay | Admitting: Adult Health

## 2019-06-08 ENCOUNTER — Non-Acute Institutional Stay (SKILLED_NURSING_FACILITY): Payer: BC Managed Care – PPO | Admitting: Internal Medicine

## 2019-06-08 ENCOUNTER — Encounter (HOSPITAL_COMMUNITY)
Admission: RE | Admit: 2019-06-08 | Discharge: 2019-06-08 | Disposition: A | Discharge status: Home / Self Care | Payer: BC Managed Care – PPO | Source: Skilled Nursing Facility | Attending: Internal Medicine | Admitting: Internal Medicine

## 2019-06-08 ENCOUNTER — Other Ambulatory Visit: Payer: Self-pay

## 2019-06-08 ENCOUNTER — Encounter: Payer: Self-pay | Admitting: Internal Medicine

## 2019-06-08 ENCOUNTER — Inpatient Hospital Stay (HOSPITAL_COMMUNITY): Payer: BC Managed Care – PPO

## 2019-06-08 ENCOUNTER — Encounter (HOSPITAL_COMMUNITY): Payer: Self-pay | Admitting: *Deleted

## 2019-06-08 DIAGNOSIS — Z7401 Bed confinement status: Secondary | ICD-10-CM | POA: Diagnosis not present

## 2019-06-08 DIAGNOSIS — R471 Dysarthria and anarthria: Secondary | ICD-10-CM | POA: Diagnosis not present

## 2019-06-08 DIAGNOSIS — E161 Other hypoglycemia: Secondary | ICD-10-CM | POA: Diagnosis not present

## 2019-06-08 DIAGNOSIS — R680 Hypothermia, not associated with low environmental temperature: Secondary | ICD-10-CM | POA: Diagnosis present

## 2019-06-08 DIAGNOSIS — S31109S Unspecified open wound of abdominal wall, unspecified quadrant without penetration into peritoneal cavity, sequela: Secondary | ICD-10-CM | POA: Diagnosis not present

## 2019-06-08 DIAGNOSIS — Z91040 Latex allergy status: Secondary | ICD-10-CM

## 2019-06-08 DIAGNOSIS — N179 Acute kidney failure, unspecified: Secondary | ICD-10-CM | POA: Diagnosis not present

## 2019-06-08 DIAGNOSIS — B961 Klebsiella pneumoniae [K. pneumoniae] as the cause of diseases classified elsewhere: Secondary | ICD-10-CM | POA: Diagnosis present

## 2019-06-08 DIAGNOSIS — B342 Coronavirus infection, unspecified: Secondary | ICD-10-CM | POA: Diagnosis not present

## 2019-06-08 DIAGNOSIS — J440 Chronic obstructive pulmonary disease with acute lower respiratory infection: Secondary | ICD-10-CM | POA: Diagnosis not present

## 2019-06-08 DIAGNOSIS — N183 Chronic kidney disease, stage 3 unspecified: Secondary | ICD-10-CM | POA: Diagnosis not present

## 2019-06-08 DIAGNOSIS — M810 Age-related osteoporosis without current pathological fracture: Secondary | ICD-10-CM | POA: Insufficient documentation

## 2019-06-08 DIAGNOSIS — Z8614 Personal history of Methicillin resistant Staphylococcus aureus infection: Secondary | ICD-10-CM

## 2019-06-08 DIAGNOSIS — Z803 Family history of malignant neoplasm of breast: Secondary | ICD-10-CM

## 2019-06-08 DIAGNOSIS — L089 Local infection of the skin and subcutaneous tissue, unspecified: Secondary | ICD-10-CM

## 2019-06-08 DIAGNOSIS — E860 Dehydration: Secondary | ICD-10-CM

## 2019-06-08 DIAGNOSIS — S31109A Unspecified open wound of abdominal wall, unspecified quadrant without penetration into peritoneal cavity, initial encounter: Secondary | ICD-10-CM | POA: Diagnosis not present

## 2019-06-08 DIAGNOSIS — R29810 Facial weakness: Secondary | ICD-10-CM | POA: Diagnosis not present

## 2019-06-08 DIAGNOSIS — N390 Urinary tract infection, site not specified: Secondary | ICD-10-CM | POA: Diagnosis present

## 2019-06-08 DIAGNOSIS — Z6831 Body mass index (BMI) 31.0-31.9, adult: Secondary | ICD-10-CM | POA: Diagnosis not present

## 2019-06-08 DIAGNOSIS — E162 Hypoglycemia, unspecified: Secondary | ICD-10-CM

## 2019-06-08 DIAGNOSIS — Z1612 Extended spectrum beta lactamase (ESBL) resistance: Secondary | ICD-10-CM | POA: Diagnosis not present

## 2019-06-08 DIAGNOSIS — Z792 Long term (current) use of antibiotics: Secondary | ICD-10-CM

## 2019-06-08 DIAGNOSIS — I482 Chronic atrial fibrillation, unspecified: Secondary | ICD-10-CM | POA: Diagnosis not present

## 2019-06-08 DIAGNOSIS — Z993 Dependence on wheelchair: Secondary | ICD-10-CM

## 2019-06-08 DIAGNOSIS — Z8619 Personal history of other infectious and parasitic diseases: Secondary | ICD-10-CM

## 2019-06-08 DIAGNOSIS — M62562 Muscle wasting and atrophy, not elsewhere classified, left lower leg: Secondary | ICD-10-CM | POA: Diagnosis present

## 2019-06-08 DIAGNOSIS — U071 COVID-19: Secondary | ICD-10-CM | POA: Diagnosis not present

## 2019-06-08 DIAGNOSIS — S31109D Unspecified open wound of abdominal wall, unspecified quadrant without penetration into peritoneal cavity, subsequent encounter: Secondary | ICD-10-CM | POA: Diagnosis not present

## 2019-06-08 DIAGNOSIS — E669 Obesity, unspecified: Secondary | ICD-10-CM | POA: Diagnosis present

## 2019-06-08 DIAGNOSIS — N1831 Chronic kidney disease, stage 3a: Secondary | ICD-10-CM | POA: Diagnosis not present

## 2019-06-08 DIAGNOSIS — E1165 Type 2 diabetes mellitus with hyperglycemia: Secondary | ICD-10-CM | POA: Diagnosis present

## 2019-06-08 DIAGNOSIS — A419 Sepsis, unspecified organism: Principal | ICD-10-CM

## 2019-06-08 DIAGNOSIS — I130 Hypertensive heart and chronic kidney disease with heart failure and stage 1 through stage 4 chronic kidney disease, or unspecified chronic kidney disease: Secondary | ICD-10-CM | POA: Insufficient documentation

## 2019-06-08 DIAGNOSIS — Z7901 Long term (current) use of anticoagulants: Secondary | ICD-10-CM | POA: Diagnosis not present

## 2019-06-08 DIAGNOSIS — E876 Hypokalemia: Secondary | ICD-10-CM | POA: Diagnosis not present

## 2019-06-08 DIAGNOSIS — D638 Anemia in other chronic diseases classified elsewhere: Secondary | ICD-10-CM | POA: Diagnosis present

## 2019-06-08 DIAGNOSIS — I6389 Other cerebral infarction: Secondary | ICD-10-CM | POA: Diagnosis not present

## 2019-06-08 DIAGNOSIS — R4182 Altered mental status, unspecified: Secondary | ICD-10-CM | POA: Diagnosis not present

## 2019-06-08 DIAGNOSIS — R0602 Shortness of breath: Secondary | ICD-10-CM

## 2019-06-08 DIAGNOSIS — D72829 Elevated white blood cell count, unspecified: Secondary | ICD-10-CM | POA: Diagnosis not present

## 2019-06-08 DIAGNOSIS — R4781 Slurred speech: Secondary | ICD-10-CM | POA: Diagnosis not present

## 2019-06-08 DIAGNOSIS — Z7951 Long term (current) use of inhaled steroids: Secondary | ICD-10-CM

## 2019-06-08 DIAGNOSIS — F329 Major depressive disorder, single episode, unspecified: Secondary | ICD-10-CM | POA: Diagnosis present

## 2019-06-08 DIAGNOSIS — Z888 Allergy status to other drugs, medicaments and biological substances status: Secondary | ICD-10-CM

## 2019-06-08 DIAGNOSIS — D649 Anemia, unspecified: Secondary | ICD-10-CM | POA: Diagnosis not present

## 2019-06-08 DIAGNOSIS — J22 Unspecified acute lower respiratory infection: Secondary | ICD-10-CM | POA: Diagnosis present

## 2019-06-08 DIAGNOSIS — I959 Hypotension, unspecified: Secondary | ICD-10-CM | POA: Diagnosis not present

## 2019-06-08 DIAGNOSIS — L88 Pyoderma gangrenosum: Secondary | ICD-10-CM | POA: Insufficient documentation

## 2019-06-08 DIAGNOSIS — E11649 Type 2 diabetes mellitus with hypoglycemia without coma: Secondary | ICD-10-CM | POA: Diagnosis not present

## 2019-06-08 DIAGNOSIS — G9340 Encephalopathy, unspecified: Secondary | ICD-10-CM | POA: Insufficient documentation

## 2019-06-08 DIAGNOSIS — R404 Transient alteration of awareness: Secondary | ICD-10-CM | POA: Diagnosis not present

## 2019-06-08 DIAGNOSIS — E8809 Other disorders of plasma-protein metabolism, not elsewhere classified: Secondary | ICD-10-CM | POA: Diagnosis not present

## 2019-06-08 DIAGNOSIS — Z833 Family history of diabetes mellitus: Secondary | ICD-10-CM

## 2019-06-08 DIAGNOSIS — Z794 Long term (current) use of insulin: Secondary | ICD-10-CM

## 2019-06-08 DIAGNOSIS — R29818 Other symptoms and signs involving the nervous system: Secondary | ICD-10-CM | POA: Diagnosis not present

## 2019-06-08 DIAGNOSIS — K219 Gastro-esophageal reflux disease without esophagitis: Secondary | ICD-10-CM | POA: Insufficient documentation

## 2019-06-08 DIAGNOSIS — D631 Anemia in chronic kidney disease: Secondary | ICD-10-CM | POA: Insufficient documentation

## 2019-06-08 DIAGNOSIS — G9341 Metabolic encephalopathy: Secondary | ICD-10-CM | POA: Diagnosis present

## 2019-06-08 DIAGNOSIS — Z79899 Other long term (current) drug therapy: Secondary | ICD-10-CM

## 2019-06-08 DIAGNOSIS — M2550 Pain in unspecified joint: Secondary | ICD-10-CM | POA: Diagnosis not present

## 2019-06-08 DIAGNOSIS — Z823 Family history of stroke: Secondary | ICD-10-CM

## 2019-06-08 DIAGNOSIS — I5032 Chronic diastolic (congestive) heart failure: Secondary | ICD-10-CM | POA: Diagnosis present

## 2019-06-08 DIAGNOSIS — M62561 Muscle wasting and atrophy, not elsewhere classified, right lower leg: Secondary | ICD-10-CM | POA: Diagnosis present

## 2019-06-08 DIAGNOSIS — R0902 Hypoxemia: Secondary | ICD-10-CM

## 2019-06-08 DIAGNOSIS — M6281 Muscle weakness (generalized): Secondary | ICD-10-CM | POA: Insufficient documentation

## 2019-06-08 DIAGNOSIS — G459 Transient cerebral ischemic attack, unspecified: Secondary | ICD-10-CM | POA: Diagnosis not present

## 2019-06-08 DIAGNOSIS — J9811 Atelectasis: Secondary | ICD-10-CM | POA: Diagnosis not present

## 2019-06-08 DIAGNOSIS — R531 Weakness: Secondary | ICD-10-CM | POA: Diagnosis not present

## 2019-06-08 DIAGNOSIS — K5730 Diverticulosis of large intestine without perforation or abscess without bleeding: Secondary | ICD-10-CM | POA: Diagnosis not present

## 2019-06-08 DIAGNOSIS — R000 Tachycardia, unspecified: Secondary | ICD-10-CM | POA: Diagnosis not present

## 2019-06-08 LAB — COMPREHENSIVE METABOLIC PANEL
ALT: 17 U/L (ref 0–44)
AST: 18 U/L (ref 15–41)
Albumin: 1.7 g/dL — ABNORMAL LOW (ref 3.5–5.0)
Alkaline Phosphatase: 131 U/L — ABNORMAL HIGH (ref 38–126)
Anion gap: 7 (ref 5–15)
BUN: 56 mg/dL — ABNORMAL HIGH (ref 8–23)
CO2: 18 mmol/L — ABNORMAL LOW (ref 22–32)
Calcium: 7.2 mg/dL — ABNORMAL LOW (ref 8.9–10.3)
Chloride: 107 mmol/L (ref 98–111)
Creatinine, Ser: 1.67 mg/dL — ABNORMAL HIGH (ref 0.44–1.00)
GFR calc Af Amer: 36 mL/min — ABNORMAL LOW (ref >60)
GFR calc non Af Amer: 31 mL/min — ABNORMAL LOW (ref >60)
Glucose, Bld: 61 mg/dL — ABNORMAL LOW (ref 70–99)
Potassium: 3.6 mmol/L (ref 3.5–5.1)
Sodium: 132 mmol/L — ABNORMAL LOW (ref 135–145)
Total Bilirubin: 0.3 mg/dL (ref 0.3–1.2)
Total Protein: 5.1 g/dL — ABNORMAL LOW (ref 6.5–8.1)

## 2019-06-08 LAB — CBC WITH DIFFERENTIAL/PLATELET
Abs Immature Granulocytes: 0.38 10*3/uL — ABNORMAL HIGH (ref 0.00–0.07)
Basophils Absolute: 0.1 10*3/uL (ref 0.0–0.1)
Basophils Relative: 0 %
Eosinophils Absolute: 0 10*3/uL (ref 0.0–0.5)
Eosinophils Relative: 0 %
HCT: 31.6 % — ABNORMAL LOW (ref 36.0–46.0)
Hemoglobin: 9.4 g/dL — ABNORMAL LOW (ref 12.0–15.0)
Immature Granulocytes: 1 %
Lymphocytes Relative: 3 %
Lymphs Abs: 1 10*3/uL (ref 0.7–4.0)
MCH: 25.9 pg — ABNORMAL LOW (ref 26.0–34.0)
MCHC: 29.7 g/dL — ABNORMAL LOW (ref 30.0–36.0)
MCV: 87.1 fL (ref 80.0–100.0)
Monocytes Absolute: 0.9 10*3/uL (ref 0.1–1.0)
Monocytes Relative: 3 %
Neutro Abs: 27.2 10*3/uL — ABNORMAL HIGH (ref 1.7–7.7)
Neutrophils Relative %: 93 %
Platelets: 521 10*3/uL — ABNORMAL HIGH (ref 150–400)
RBC: 3.63 MIL/uL — ABNORMAL LOW (ref 3.87–5.11)
RDW: 18.3 % — ABNORMAL HIGH (ref 11.5–15.5)
WBC: 29.6 10*3/uL — ABNORMAL HIGH (ref 4.0–10.5)
nRBC: 0 % (ref 0.0–0.2)

## 2019-06-08 LAB — URINALYSIS, ROUTINE W REFLEX MICROSCOPIC
Bilirubin Urine: NEGATIVE
Glucose, UA: NEGATIVE mg/dL
Ketones, ur: NEGATIVE mg/dL
Nitrite: POSITIVE — AB
Protein, ur: NEGATIVE mg/dL
Specific Gravity, Urine: 1.015 (ref 1.005–1.030)
WBC, UA: >50 WBC/hpf — ABNORMAL HIGH (ref 0–5)
pH: 5 (ref 5.0–8.0)

## 2019-06-08 LAB — BASIC METABOLIC PANEL
Anion gap: 9 (ref 5–15)
BUN: 62 mg/dL — ABNORMAL HIGH (ref 8–23)
CO2: 18 mmol/L — ABNORMAL LOW (ref 22–32)
Calcium: 7.5 mg/dL — ABNORMAL LOW (ref 8.9–10.3)
Chloride: 105 mmol/L (ref 98–111)
Creatinine, Ser: 2.2 mg/dL — ABNORMAL HIGH (ref 0.44–1.00)
GFR calc Af Amer: 26 mL/min — ABNORMAL LOW (ref >60)
GFR calc non Af Amer: 22 mL/min — ABNORMAL LOW (ref >60)
Glucose, Bld: 158 mg/dL — ABNORMAL HIGH (ref 70–99)
Potassium: 4 mmol/L (ref 3.5–5.1)
Sodium: 132 mmol/L — ABNORMAL LOW (ref 135–145)

## 2019-06-08 LAB — MAGNESIUM: Magnesium: 4.6 mg/dL — ABNORMAL HIGH (ref 1.7–2.4)

## 2019-06-08 LAB — CBG MONITORING, ED
Glucose-Capillary: 116 mg/dL — ABNORMAL HIGH (ref 70–99)
Glucose-Capillary: 119 mg/dL — ABNORMAL HIGH (ref 70–99)
Glucose-Capillary: 121 mg/dL — ABNORMAL HIGH (ref 70–99)
Glucose-Capillary: 41 mg/dL — CL (ref 70–99)
Glucose-Capillary: 56 mg/dL — ABNORMAL LOW (ref 70–99)
Glucose-Capillary: 69 mg/dL — ABNORMAL LOW (ref 70–99)

## 2019-06-08 LAB — SARS CORONAVIRUS 2 BY RT PCR (HOSPITAL ORDER, PERFORMED IN ~~LOC~~ HOSPITAL LAB): SARS Coronavirus 2: POSITIVE — AB

## 2019-06-08 LAB — APTT: aPTT: 49 seconds — ABNORMAL HIGH (ref 24–36)

## 2019-06-08 LAB — LACTIC ACID, PLASMA: Lactic Acid, Venous: 1.2 mmol/L (ref 0.5–1.9)

## 2019-06-08 LAB — AMMONIA: Ammonia: 29 umol/L (ref 9–35)

## 2019-06-08 LAB — PROTIME-INR
INR: 2.2 — ABNORMAL HIGH (ref 0.8–1.2)
Prothrombin Time: 24 seconds — ABNORMAL HIGH (ref 11.4–15.2)

## 2019-06-08 MED ORDER — DEXTROSE 50 % IV SOLN
1.0000 | Freq: Once | INTRAVENOUS | Status: AC
  Administered 2019-06-08: 50 mL via INTRAVENOUS
  Filled 2019-06-08: qty 50

## 2019-06-08 MED ORDER — VANCOMYCIN HCL 1.5 G IV SOLR: 1500.0000 mg | INTRAVENOUS | Status: DC

## 2019-06-08 MED ORDER — SODIUM CHLORIDE 0.9 % IV SOLN
2.0000 g | Freq: Once | INTRAVENOUS | Status: DC
  Administered 2019-06-08: 2 g via INTRAVENOUS
  Filled 2019-06-08: qty 2

## 2019-06-08 MED ORDER — DEXTROSE 50 % IV SOLN
1.0000 | Freq: Once | INTRAVENOUS | Status: AC
  Administered 2019-06-08: 22:00:00 50 mL via INTRAVENOUS
  Filled 2019-06-08: qty 50

## 2019-06-08 MED ORDER — VANCOMYCIN HCL IN DEXTROSE 1-5 GM/200ML-% IV SOLN: 1000.0000 mg | Freq: Once | INTRAVENOUS | Status: DC

## 2019-06-08 MED ORDER — VANCOMYCIN HCL 1.5 G IV SOLR
1500.0000 mg | Freq: Once | INTRAVENOUS | Status: AC
  Administered 2019-06-08: 1500 mg via INTRAVENOUS
  Filled 2019-06-08: qty 1500

## 2019-06-08 MED ORDER — SODIUM CHLORIDE 0.9 % IV BOLUS
500.0000 mL | Freq: Once | INTRAVENOUS | Status: AC
  Administered 2019-06-08: 500 mL via INTRAVENOUS

## 2019-06-08 MED ORDER — SODIUM CHLORIDE 0.9 % IV SOLN
INTRAVENOUS | Status: AC
  Administered 2019-06-09: via INTRAVENOUS

## 2019-06-08 MED ORDER — SODIUM CHLORIDE 0.9 % IV SOLN
1.0000 g | INTRAVENOUS | Status: DC
  Administered 2019-06-09: 1000 mg via INTRAVENOUS
  Filled 2019-06-08 (×2): qty 1

## 2019-06-08 NOTE — Progress Notes (Signed)
Location:    [facility] Room Number: 158/P Place of Service:  SNF (31)   CODE STATUS: Full Code  Allergies  Allergen Reactions  . Feraheme [Ferumoxytol] Other (See Comments)    Back pain (yelling out with back pain)  . Propranolol Swelling    Pt states it may have been leg swelling  . Topamax [Topiramate] Other (See Comments)    hallucinations  . Latex Itching and Rash    Chief Complaint  Patient presents with  . Acute Visit    Status    HPI:  She has had a change in her status. Her incision line has been bleeding and has required the use of silver nitrate. She has nausea and vomiting; unable to tolerate po intake. She has poor skin turgor; dry oral mucosa. There are no reports of fevers. Staff report that she is constipation.   Past Medical History:  Diagnosis Date  . Atrial fibrillation ([place])   . CHF (congestive heart failure) ([place])   . Depression   . Diabetes mellitus without complication ([place])   . 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 Hypertension   . Morbid obesity ([place])   . Osteoporosis   . Urinary incontinence   . UTI (lower urinary tract infection) 01/2016   Cipro for Klebsiella pneumoniae isolate  . Wheelchair bound    bedbound    Past Surgical History:  Procedure Laterality Date  . ABDOMINAL HYSTERECTOMY    . APPLICATION OF WOUND VAC  03/09/2019   Procedure: APPLICATION OF WOUND VAC;  Surgeon: Virl Cagey, MD;  Location: [facility];  Service: General;;  . BIOPSY  09/06/2018   Procedure: BIOPSY;  Surgeon: Danie Binder, MD;  Location: [facility];  Service: Endoscopy;;  gastric bx's  . CESAREAN SECTION    . CHOLECYSTECTOMY    . ESOPHAGEAL DILATION  09/06/2018   Procedure: ESOPHAGEAL DILATION;  Surgeon: Danie Binder, MD;  Location: [facility];  Service: Endoscopy;;  . ESOPHAGOGASTRODUODENOSCOPY (EGD) WITH PROPOFOL N/A 09/06/2018   Procedure: ESOPHAGOGASTRODUODENOSCOPY (EGD) WITH  PROPOFOL;  Surgeon: Danie Binder, MD;  Location: [facility];  Service: Endoscopy;  Laterality: N/A;  dilatation  . FEMUR IM NAIL Left 02/20/2016  . FEMUR IM NAIL Left 02/20/2016   Procedure: INTRAMEDULLARY (IM) RETROGRADE FEMORAL NAILING;  Surgeon: Leandrew Koyanagi, MD;  Location: [facility];  Service: Orthopedics;  Laterality: Left;  . PANCREAS SURGERY  1967   1 cyst excised and one cyst drained  . TEE WITHOUT CARDIOVERSION N/A 04/05/2018   Procedure: TRANSESOPHAGEAL ECHOCARDIOGRAM (TEE);  Surgeon: Dorothy Spark, MD;  Location: [facility];  Service: Cardiovascular;  Laterality: N/A;  . [place]    . WOUND DEBRIDEMENT N/A 03/09/2019   Procedure: EXCISIONAL DEBRIDEMENT OF ABDOMINAL WOUND ULCERS;  Surgeon: Virl Cagey, MD;  Location: [facility];  Service: General;  Laterality: N/A;  . WRIST FRACTURE SURGERY      Social History   Socioeconomic History  . Marital status: Married    Spouse name: 1  . Number of children: Not on file  . Years of education: Not on file  . Highest education level: Not on file  Occupational History  . Occupation: retired  Scientific laboratory technician  . Financial resource strain: Not on file  . Food insecurity    Worry: Not on file    Inability: Not on file  . Transportation needs    Medical: No  Non-medical: No  Tobacco Use  . Smoking status: Never Smoker  . Smokeless tobacco: Never Used  Substance and Sexual Activity  . Alcohol use: No  . Drug use: No  . Sexual activity: Not on file  Lifestyle  . Physical activity    Days per week: Not on file    Minutes per session: Not on file  . Stress: Not on file  Relationships  . Social Herbalist on phone: Not on file    Gets together: Not on file    Attends religious service: Not on file    Active member of club or organization: Not on file    Attends meetings of clubs or organizations: Not on file    Relationship status: Not on file  . Intimate partner violence    Fear of  current or ex partner: Not on file    Emotionally abused: Not on file    Physically abused: Not on file    Forced sexual activity: Not on file  Other Topics Concern  . Not on file  Social History Narrative   Lives in [place] with husband.  More or less w/c bound since leg fx about 3 yrs ago.   Family History  Problem Relation Age of Onset  . Diabetes Mother        died in her 46's of a stroke  . Cancer Mother   . Stroke Mother   . Breast cancer Mother   . Diabetes Father        died in his 86's of a stroke.  . Stroke Father   . Diabetes Brother        died @ 42 of a stroke.  . Stroke Brother   . Diabetes Maternal Grandmother   . Diabetes Maternal Grandfather   . Diabetes Paternal Grandmother   . Diabetes Paternal Grandfather   . Breast cancer Maternal Aunt       VITAL SIGNS BP 99/74   Pulse (!) 52   Temp (!) 97.1 F (36.2 C) (Oral)   Resp 20   Ht 5' 4"  (1.626 m)   Wt 172 lb 12.8 oz (78.4 kg)   BMI 29.66 kg/m   Outpatient Encounter Medications as of 06/07/2019  Medication Sig  . albuterol (PROVENTIL HFA;VENTOLIN HFA) 108 (90 Base) MCG/ACT inhaler Inhale 2 puffs into the lungs every 6 (six) hours as needed for wheezing or shortness of breath.  . Amino Acids-Protein Hydrolys (FEEDING SUPPLEMENT, PRO-STAT SUGAR FREE 64,) LIQD Take 30 mLs by mouth 3 (three) times daily.  Marland Kitchen apixaban (ELIQUIS) 5 MG TABS tablet Take 1 tablet (5 mg total) by mouth 2 (two) times daily.  . bisacodyl (DULCOLAX) 10 MG suppository Place 10 mg rectally as needed for moderate constipation.  Marland Kitchen diltiazem (CARDIZEM CD) 120 MG 24 hr capsule Take 1 capsule (120 mg total) by mouth daily.  . ferrous sulfate (FERROUSUL) 325 (65 FE) MG tablet Take 1 tablet (325 mg total) by mouth 2 (two) times daily with a meal.  . fluticasone (FLONASE) 50 MCG/ACT nasal spray Place 2 sprays into both nostrils daily as needed for allergies or rhinitis (congestion).   . furosemide (LASIX) 20 MG tablet Take 1 tablet (20 mg  total) by mouth daily.  Marland Kitchen HYDROcodone-acetaminophen (NORCO) 5-325 MG tablet Take 1 tablet by mouth every 6 (six) hours.  . insulin aspart (NOVOLOG PENFILL) cartridge Inject 6 Units into the skin 3 (three) times daily with meals.  . Insulin Glargine (LANTUS SOLOSTAR) 100  UNIT/ML Solostar Pen Inject 10 Units into the skin at bedtime.  . levalbuterol (XOPENEX) 0.63 MG/3ML nebulizer solution Take 0.63 mg by nebulization every 6 (six) hours as needed for wheezing or shortness of breath.   . metoprolol tartrate (LOPRESSOR) 50 MG tablet Take 50 mg by mouth 2 (two) times daily.   . NON FORMULARY Diet: Clear liquid diet x 24 hours  . nutrition supplement, JUVEN, (JUVEN) PACK Take 1 packet by mouth 2 (two) times daily between meals.  . ondansetron (ZOFRAN) 4 MG tablet Take 4 mg by mouth. Before meals for nausea  . pantoprazole (PROTONIX) 40 MG tablet Take 1 tablet (40 mg total) by mouth daily before breakfast.  . potassium chloride SA (KLOR-CON) 20 MEQ tablet Take 2 tablets (40 mEq total) by mouth daily.  Marland Kitchen SPIRIVA RESPIMAT 1.25 MCG/ACT AERS Inhale 1 puff into the lungs daily.    No facility-administered encounter medications on file as of 06/07/2019.      SIGNIFICANT DIAGNOSTIC EXAMS   PREVIOUS;   05-09-19: chest x-ray: No acute disease   05-17-19: EEG: This study is suggestive of moderate diffuse encephalopathy, non specific to etiology.  No seizures or epileptiform discharges were seen throughout the recording.   05-17-19: ct of head: Examination is significantly limited by motion artifact throughout. Within this limitation, no acute intracranial pathology. Small-vessel white matter disease.  NO NEW EXAMS.   LABS REVIEWED PREVIOUS:   05-09-19:wbc 7.7; hgb 8.8; hct 31.4; mcv 89.5 ;plt 343; glucose 193; bun 9; creat 0.63; k+ 3.4; na++ 140; ca 7.5; liver normal albumin 2.1; BNP 461.0; hgb a1c 8.8 05-11-19: wbc 5.9; hgb 8.2; hct 29.4; mcv 89.1 plt 338; glucose 279; bun 12; creat 0.70; k+ 3.9; na++  139; ca 7.3; mag 1.2 05-16-19: wbc 9.9; hgb 9.0; hct 30.3; mcv 85.1 pl 371; glucose 93; bun 10; creat 0.90; k+ 3.3; na++ 139; ca 8.1; mag 1.2 tsh 1.071 05-19-19: glucose 79; bun 8; creat 0.85; k+ 3.7; na++ 140; ca 7.9; mag 1.7 05-23-19: glucose 157; bun 13; creat 0.89; k+ 3.8; na++ 140; ca 7.9   TODAY;   06-01-19: glucose 109; bun 42; creat 1.35 ;k+ 5.2; na++ 137; ca 8.3; liver normal albumin 2.0; mag 1.3 06-07-19: wbc 15.1; hgb 10.6; hct 37.4; mcv 91.4 plt 526; glucose 305; bun 65; creat 2.24; k+ 6.5; na++ 131; ca 7.9; liver normal albumin 2.0; mag 1.5    Review of Systems  Constitutional: Negative for malaise/fatigue.  Respiratory: Negative for cough and shortness of breath.   Cardiovascular: Negative for chest pain, palpitations and leg swelling.  Gastrointestinal: Positive for constipation and nausea. Negative for abdominal pain and heartburn.  Musculoskeletal: Negative for back pain, joint pain and myalgias.  Skin: Negative.   Neurological: Negative for dizziness.  Psychiatric/Behavioral: The patient is not nervous/anxious.     Physical Exam Constitutional:      General: She is not in acute distress.    Appearance: She is well-developed. She is obese. She is not diaphoretic.     Comments: Poor skin turgor  HENT:     Mouth/Throat:     Comments: Oral mucosa is dry  Neck:     Musculoskeletal: Neck supple.     Thyroid: No thyromegaly.  Cardiovascular:     Rate and Rhythm: Normal rate and regular rhythm.     Pulses: Normal pulses.     Heart sounds: Normal heart sounds.  Pulmonary:     Effort: Pulmonary effort is normal. No respiratory distress.  Breath sounds: Normal breath sounds.  Abdominal:     General: There is no distension.     Palpations: Abdomen is soft.     Tenderness: There is no abdominal tenderness.     Comments: Bowel sounds hypoactive Rectum full of hard stool  Musculoskeletal:     Right lower leg: No edema.     Left lower leg: Edema present.      Comments: Is able to move all extremities   Lymphadenopathy:     Cervical: No cervical adenopathy.  Skin:    General: Skin is warm and dry.     Comments: : Abdominal incision line: open without signs of infection present 19.5 x 2.5 x 1.8 cm       Neurological:     Mental Status: She is alert and oriented to person, place, and time.  Psychiatric:        Mood and Affect: Mood normal.      ASSESSMENT/ PLAN:  TODAY  1. Acute on chronic stage 3 renal failure 2. Hypomagnesemia 3. Hyperkalemia 4. Acute UTI 5. Chronic constipation  Will begin IVF: NS at 100 cc per hour Kayexalate 30 gm now Mag IV one time Senna s nightly Dulcolax sup. Now Will lower iron to one time daily for better tolerance Will begin Rocephin 1 gm daily through 06-14-19 In AM will check bmp mag in the AM>        MD is aware of resident's narcotic use and is in agreement with current plan of care. We will attempt to wean resident as appropriate.  Ok Edwards NP [facility]  Contact [phone] Monday through Friday 8am- 5pm  After hours call [phone]

## 2019-06-08 NOTE — Progress Notes (Signed)
Pharmacy Antibiotic Note  [name] is a 68 y.o. female admitted on 06/08/2019 with altered mental status and hypoglycemia. Patient recently treated with ertapenem for ESBL abdominal infection.  Pharmacy has been consulted for vancomycin dosing.  Plan: Ertapenem 1g IV q24h per MD Vancomycin 1529m IV q48h for estimated AUC 485 (goal 400-550) Check vancomycin levels at steady state if needed Follow up renal function & cultures  Height: 5' 4"  (162.6 cm) Weight: 171 lb 15.3 oz (78 kg) IBW/kg (Calculated) : 54.7  Temp (24hrs), Avg:97.1 F (36.2 C), Min:96.8 F (36 C), Max:97.4 F (36.3 C)  Recent Labs  Lab 06/07/19 1208 06/08/19 0600 06/08/19 1850  WBC 15.1*  --  29.6*  CREATININE 2.24* 2.20* 1.67*  LATICACIDVEN  --   --  1.2    Estimated Creatinine Clearance: 32.6 mL/min (A) (by C-G formula based on SCr of 1.67 mg/dL (H)).    Allergies  Allergen Reactions  . Feraheme [Ferumoxytol] Other (See Comments)    Back pain (yelling out with back pain)  . Propranolol Swelling    Pt states it may have been leg swelling  . Topamax [Topiramate] Other (See Comments)    hallucinations  . Latex Itching and Rash    Antimicrobials this admission: 10/21 Vancomycin >> 10/21 Ertapenem >>  Dose adjustments this admission:   Microbiology results: 9/5 abdomen: moderate ESBL E.coli 10/20 UCx: >100k GNR 10/21 BCx: sent 10/21 UCx: sent  Thank you for allowing pharmacy to be a part of this patient's care.  EPeggyann Juba PharmD, BCPS 06/08/2019 8:10 PM

## 2019-06-08 NOTE — ED Provider Notes (Signed)
[facility] EMERGENCY DEPARTMENT Provider Note   CSN: [id number] Arrival date & time: 06/08/19  1823     History   Chief Complaint Chief Complaint  Patient presents with   Hypoglycemia    HPI [name] is a 68 y.o. female history of CHF, atrial fibrillation, diabetes, morbid obesity, hypertension, CKD, chronic wound of the abdomen presents today via EMS for altered mental status.  Patient was diagnosed today with COVID-19 virus.  According to triage note patient noted to be altered at [facility], hypoglycemic with abdominal wound which is chronic.  CBG 56 on arrival.  On my evaluation patient is alert, oriented to self, place, somewhat confused on time of the event.  She knows that she has been diagnosed with COVID-19 today but is not quite sure what this means.  She reports cough and mild shortness of breath over the past few days.  She reports increased pain to her chronic abdominal wound a aching constant moderate intensity worsened with palpation without alleviating factors.  She is unsure if she has had fevers at home or chills, does report taking intermittent Tylenol but unsure when her last dose was.  Patient reports that she has had persistently hypoglycemia in the past and is unsure why.  She denies headache/vision changes, speech difficulty, neck pain, chest pain, nausea/vomiting, diarrhea, dysuria/hematuria or fall/injury. - Chart review shows patient discharged on 05/25/2019 after a 16-day admission to the hospital.  Originally found to be in A. fib with RVR and started on IV Cardizem, multiple episodes of hypoglycemia and delirium.  PICC line inserted and patient has been on Invanz for questionable pyoderma gangrenosum of abdominal wound.  Patient's acute encephalopathy was of unknown cause.    HPI  Past Medical History:  Diagnosis Date   Atrial fibrillation (HCC)    CHF (congestive heart failure) ([place])    Depression    Diabetes mellitus without  complication ([place])    History of transesophageal echocardiography (TEE) 03/2018   LV thrombus   Hypertension    Morbid obesity ([place])    Osteoporosis    Urinary incontinence    UTI (lower urinary tract infection) 01/2016   Cipro for Klebsiella pneumoniae isolate   Wheelchair bound    bedbound    Patient Active Problem List   Diagnosis Date Noted   Acute delirium 06/03/2019   Hypertension associated with type 2 diabetes mellitus ([place]) 05/31/2019   GERD without esophagitis 05/31/2019   CKD stage 3 due to type 2 diabetes mellitus ([place]) 05/31/2019   Anemia due to stage 3a chronic kidney disease 05/31/2019   Protein-calorie malnutrition, severe ([place]) 05/31/2019   Acute encephalopathy 05/25/2019   Atrial fibrillation with tachycardic ventricular rate ([place]) 04/28/2019   Uncontrolled type 2 diabetes mellitus with chronic kidney disease, with long-term current use of insulin ([place]) 04/22/2019   Pyoderma gangrenosum 03/11/2019   Wound infection 03/09/2019   Chronic wound infection of abdomen    Wheelchair bound    Abdominal wall skin ulcer ([place]) 03/07/2019   Acute pulmonary edema ([place]) 02/17/2019   Iron deficiency anemia 02/01/2019   Abdominal wall ulcer, with fat layer exposed ([place]) 01/16/2019   Elevated troponin    Nausea and vomiting    Symptomatic bradycardia 01/10/2019   DKA (diabetic ketoacidoses) ([place]) 01/10/2019   Urinary incontinence 12/28/2018   Recurrent urinary tract infection 12/16/2018   Wound, open, anterior abdominal wall 12/05/2018   Hypotension due to hypovolemia    Gastritis due to nonsteroidal anti-inflammatory drug  Esophageal dysphagia    GI bleed 09/03/2018   Coagulopathy (HCC)    Colitis    Lower abdominal pain    Rectal bleeding    Dehydration 08/16/2018   Acute on chronic diastolic CHF (congestive heart failure) ([place]) 08/10/2018   Acute lower UTI 08/10/2018   Hypokalemia 08/10/2018   COPD (chronic  obstructive pulmonary disease) ([place]) 08/10/2018   Thyroid lesion 08/10/2018   Closed fracture of right ankle 06/10/2018   Closed fracture of left tibia and fibula with routine healing 04/13/18 06/10/2018   Atrial fibrillation ([place]) 04/15/2018   Chronic diastolic HF (heart failure) ([place]) 04/15/2018   CKD (chronic kidney disease), stage III 04/15/2018   Left tibial fracture 04/14/2018   CHF exacerbation (HCC) 04/10/2018   SOB (shortness of breath)    Acute CHF (congestive heart failure) ([place]) 04/01/2018   Atrial fibrillation with RVR ([place]) 04/01/2018   [place] 03/11/2016   Muscle spasms of lower extremity 03/04/2016   Acute renal insufficiency 02/26/2016   History of MRSA infection 02/26/2016   HTN (hypertension) 02/19/2016   Fracture, femur, distal ([place]) 02/19/2016   Fall 02/19/2016   Depression 02/19/2016   Femur fracture, left ([place]) 02/19/2016    Past Surgical History:  Procedure Laterality Date   ABDOMINAL HYSTERECTOMY     APPLICATION OF WOUND VAC  03/09/2019   Procedure: APPLICATION OF WOUND VAC;  Surgeon: Virl Cagey, MD;  Location: [facility] ORS;  Service: General;;   BIOPSY  09/06/2018   Procedure: BIOPSY;  Surgeon: Danie Binder, MD;  Location: [facility] ENDO SUITE;  Service: Endoscopy;;  gastric bx's   CESAREAN SECTION     CHOLECYSTECTOMY     ESOPHAGEAL DILATION  09/06/2018   Procedure: ESOPHAGEAL DILATION;  Surgeon: Danie Binder, MD;  Location: [facility] ENDO SUITE;  Service: Endoscopy;;   ESOPHAGOGASTRODUODENOSCOPY (EGD) WITH PROPOFOL N/A 09/06/2018   Procedure: ESOPHAGOGASTRODUODENOSCOPY (EGD) WITH PROPOFOL;  Surgeon: Danie Binder, MD;  Location: [facility] END[facility];  Service: Endoscopy;  Laterality: N/A;  dilatation   FEMUR IM NAIL Left 02/20/2016   FEMUR IM NAIL Left 02/20/2016   Procedure: INTRAMEDULLARY (IM) RETROGRADE FEMORAL NAILING;  Surgeon: Leandrew Koyanagi, MD;  Location: [place];  Service: Orthopedics;  Laterality: Left;   PANCREAS SURGERY  1967   1  cyst excised and one cyst drained   TEE WITHOUT CARDIOVERSION N/A 04/05/2018   Procedure: TRANSESOPHAGEAL ECHOCARDIOGRAM (TEE);  Surgeon: Dorothy Spark, MD;  Location: [place];  Service: Cardiovascular;  Laterality: N/A;   VEIN LIGATION AND STRIPPING     WOUND DEBRIDEMENT N/A 03/09/2019   Procedure: EXCISIONAL DEBRIDEMENT OF ABDOMINAL WOUND ULCERS;  Surgeon: Virl Cagey, MD;  Location: [facility] ORS;  Service: General;  Laterality: N/A;   WRIST FRACTURE SURGERY       OB History   No obstetric history on file.      Home Medications    Prior to Admission medications   Medication Sig Start Date End Date Taking? Authorizing Provider  albuterol (PROVENTIL HFA;VENTOLIN HFA) 108 (90 Base) MCG/ACT inhaler Inhale 2 puffs into the lungs every 6 (six) hours as needed for wheezing or shortness of breath. 08/14/18   Kathie Dike, MD  Amino Acids-Protein Hydrolys (FEEDING SUPPLEMENT, PRO-STAT SUGAR FREE 64,) LIQD Take 30 mLs by mouth 3 (three) times daily. 05/25/19   Orson Eva, MD  apixaban (ELIQUIS) 5 MG TABS tablet Take 1 tablet (5 mg total) by mouth 2 (two) times daily. 07/21/18   Herminio Commons, MD  Ascorbic Acid (VITAMIN  C) 1000 MG tablet Take 1,000 mg by mouth daily.    [provider]  bisacodyl (DULCOLAX) 10 MG suppository Place 10 mg rectally as needed for moderate constipation.    [provider]  calcium carbonate (TUMS EX) 750 MG chewable tablet Chew 1 tablet by mouth 2 (two) times daily between meals.    [provider]  cefTRIAXone 1 g/10 mLs in sterile water (preservative free) injection Inject 1 g into the vein daily at 6 (six) AM.    [provider]  diltiazem (CARDIZEM CD) 120 MG 24 hr capsule Take 1 capsule (120 mg total) by mouth daily. 09/10/18   Johnson, Clanford L, MD  ergocalciferol (VITAMIN D2) 1.25 MG (50000 UT) capsule Take 50,000 Units by mouth once a week. Wednesdays    [provider]  ferrous sulfate  (FERROUSUL) 325 (65 FE) MG tablet Take 1 tablet (325 mg total) by mouth 2 (two) times daily with a meal. 01/16/19   Domenic Polite, MD  fluticasone [facility]) 50 MCG/ACT nasal spray Place 2 sprays into both nostrils daily as needed for allergies or rhinitis (congestion).     [provider]  HYDROcodone-acetaminophen (NORCO) 5-325 MG tablet Take 1 tablet by mouth every 6 (six) hours. 06/01/19   Hennie Duos, MD  insulin aspart (NOVOLOG PENFILL) cartridge Inject 6 Units into the skin 3 (three) times daily with meals.    [provider]  Insulin Glargine (LANTUS SOLOSTAR) 100 UNIT/ML Solostar Pen Inject 10 Units into the skin at bedtime.    [provider]  levalbuterol Penne Lash) 0.63 MG/3ML nebulizer solution Take 0.63 mg by nebulization every 6 (six) hours as needed for wheezing or shortness of breath.  02/08/19   [provider]  metoprolol tartrate (LOPRESSOR) 50 MG tablet Take 50 mg by mouth 2 (two) times daily.  02/12/19   [provider]  NON FORMULARY Diet: Clear liquid diet x 24 hours 06/07/19 06/08/19  [provider]  nutrition supplement, JUVEN, (JUVEN) PACK Take 1 packet by mouth 2 (two) times daily between meals. 05/25/19   Orson Eva, MD  ondansetron (ZOFRAN) 4 MG tablet Take 4 mg by mouth. Before meals for nausea    [provider]  pantoprazole (PROTONIX) 40 MG tablet Take 1 tablet (40 mg total) by mouth daily before breakfast. 09/11/18   Johnson, Clanford L, MD  sennosides-docusate sodium (SENOKOT-S) 8.6-50 MG tablet Take 1 tablet by mouth at bedtime.    [provider]  silver nitrate applicators 70-35 % applicator Apply 1 Stick topically as needed for bleeding.    [provider]  sodium chloride 0.9 % infusion Inject 100 mLs into the vein continuous.    [provider]  SPIRIVA RESPIMAT 1.25 MCG/ACT AERS Inhale 1 puff into the lungs daily.  05/01/19   [provider]  zinc gluconate 50  MG tablet Take 50 mg by mouth daily.    [provider]    Family History Family History  Problem Relation Age of Onset   Diabetes Mother        died in her 62's of a stroke   Cancer Mother    Stroke Mother    Breast cancer Mother    Diabetes Father        died in his 43's of a stroke.   Stroke Father    Diabetes Brother        died @ 86 of a stroke.   Stroke Brother    Diabetes  Maternal Grandmother    Diabetes Maternal Grandfather    Diabetes Paternal Grandmother    Diabetes Paternal Grandfather    Breast cancer Maternal Aunt     Social History Social History   Tobacco Use   Smoking status: Never Smoker   Smokeless tobacco: Never Used  Substance Use Topics   Alcohol use: No   Drug use: No     Allergies   Feraheme [ferumoxytol], Propranolol, Topamax [topiramate], and Latex   Review of Systems Review of Systems Ten systems are reviewed and are negative for acute change except as noted in the HPI   Physical Exam Updated Vital Signs BP (!) 123/100 (BP Location: Right Arm)    Pulse (!) 118    Temp (!) 96.8 F (36 C) (Rectal)    Resp 18    Ht 5' 4"  (1.626 m)    Wt 78 kg    SpO2 92%    BMI 29.52 kg/m   Physical Exam Constitutional:      General: She is not in acute distress.    Appearance: Normal appearance. She is well-developed. She is obese. She is not ill-appearing or diaphoretic.  HENT:     Head: Normocephalic and atraumatic. No raccoon eyes, Battle's sign or contusion.     Jaw: There is normal jaw occlusion. No trismus.     Right Ear: External ear normal.     Left Ear: External ear normal.     Nose: Nose normal.     Right Nostril: No epistaxis.     Left Nostril: No epistaxis.  Eyes:     General: Vision grossly intact. Gaze aligned appropriately.     Extraocular Movements: Extraocular movements intact.     Conjunctiva/sclera: Conjunctivae normal.     Pupils: Pupils are equal, round, and reactive to light.  Neck:      Musculoskeletal: Full passive range of motion without pain, normal range of motion and neck supple.     Trachea: Trachea and phonation normal. No tracheal deviation.  Cardiovascular:     Rate and Rhythm: Regular rhythm. Tachycardia present.     Pulses:          Dorsalis pedis pulses are 2+ on the right side and 2+ on the left side.     Heart sounds: Normal heart sounds.  Pulmonary:     Effort: Pulmonary effort is normal. No accessory muscle usage or respiratory distress.     Breath sounds: Normal air entry. Decreased breath sounds present.  Chest:     Chest wall: No tenderness or crepitus.  Abdominal:     General: There is no distension.     Palpations: Abdomen is soft.     Tenderness: There is no abdominal tenderness. There is no guarding or rebound.       Comments: Extensive chronic ulcer with serosanguineous drainage.  Musculoskeletal: Normal range of motion.     Comments: No midline C/T/L spinal tenderness to palpation, no paraspinal muscle tenderness, no deformity, crepitus, or step-off noted. No sign of injury to the neck or back. - Multiple superficial areas of ulceration to the buttock.  Feet:     Right foot:     Protective Sensation: 3 sites tested. 3 sites sensed.     Left foot:     Protective Sensation: 3 sites tested. 3 sites sensed.  Skin:    General: Skin is warm and dry.  Neurological:     Mental Status: She is alert.     GCS: GCS eye subscore  is 4. GCS verbal subscore is 5. GCS motor subscore is 6.     Comments: Alert, tired appearing, follows commands Major Cranial nerves without deficit, no facial droop Normal strength in upper and lower extremities bilaterally including dorsiflexion and plantar flexion, strong and equal grip strength Sensation normal to light and sharp touch Moves extremities without ataxia, coordination intact  Psychiatric:        Behavior: Behavior normal.    ED Treatments / Results  Labs (all labs ordered are listed, but only abnormal  results are displayed) Labs Reviewed  CBC WITH DIFFERENTIAL/PLATELET - Abnormal; Notable for the following components:      Result Value   WBC 29.6 (*)    RBC 3.63 (*)    Hemoglobin 9.4 (*)    HCT 31.6 (*)    MCH 25.9 (*)    MCHC 29.7 (*)    RDW 18.3 (*)    Platelets 521 (*)    Neutro Abs 27.2 (*)    Abs Immature Granulocytes 0.38 (*)    All other components within normal limits  COMPREHENSIVE METABOLIC PANEL - Abnormal; Notable for the following components:   Sodium 132 (*)    CO2 18 (*)    Glucose, Bld 61 (*)    BUN 56 (*)    Creatinine, Ser 1.67 (*)    Calcium 7.2 (*)    Total Protein 5.1 (*)    Albumin 1.7 (*)    Alkaline Phosphatase 131 (*)    GFR calc non Af Amer 31 (*)    GFR calc Af Amer 36 (*)    All other components within normal limits  APTT - Abnormal; Notable for the following components:   aPTT 49 (*)    All other components within normal limits  PROTIME-INR - Abnormal; Notable for the following components:   Prothrombin Time 24.0 (*)    INR 2.2 (*)    All other components within normal limits  CBG MONITORING, ED - Abnormal; Notable for the following components:   Glucose-Capillary 56 (*)    All other components within normal limits  CBG MONITORING, ED - Abnormal; Notable for the following components:   Glucose-Capillary 41 (*)    All other components within normal limits  CBG MONITORING, ED - Abnormal; Notable for the following components:   Glucose-Capillary 121 (*)    All other components within normal limits  CULTURE, BLOOD (ROUTINE X 2)  CULTURE, BLOOD (ROUTINE X 2)  URINE CULTURE  AMMONIA  LACTIC ACID, PLASMA  URINALYSIS, ROUTINE W REFLEX MICROSCOPIC    EKG None  Radiology Dg Chest Portable 1 View  Result Date: 06/08/2019 CLINICAL DATA:  Altered mental status. COVID. EXAM: PORTABLE CHEST 1 VIEW COMPARISON:  Chest x-ray dated 05/15/2019 FINDINGS: The heart size and pulmonary vascularity are normal the lungs are clear. No effusions. No  acute bone abnormality. Aortic atherosclerosis. IMPRESSION: 1. No active cardiopulmonary disease. 2. Aortic atherosclerosis. Electronically Signed   By: Lorriane Shire M.D.   On: 06/08/2019 19:43    Procedures .Critical Care Performed by: Deliah Boston, PA-C Authorized by: Deliah Boston, PA-C   Critical care provider statement:    Critical care time (minutes):  40   Critical care was necessary to treat or prevent imminent or life-threatening deterioration of the following conditions:  Sepsis   Critical care was time spent personally by me on the following activities:  Discussions with consultants, evaluation of patient's response to treatment, examination of patient, ordering and performing treatments and  interventions, ordering and review of laboratory studies, ordering and review of radiographic studies, pulse oximetry, re-evaluation of patient's condition, obtaining history from patient or surrogate, review of old charts and development of treatment plan with patient or surrogate   (including critical care time)  Medications Ordered in ED Medications  vancomycin (VANCOCIN) 1,500 mg in sodium chloride 0.9 % 500 mL IVPB (has no administration in time range)  ertapenem (INVANZ) 1,000 mg in sodium chloride 0.9 % 100 mL IVPB (has no administration in time range)  vancomycin (VANCOCIN) 1,500 mg in sodium chloride 0.9 % 500 mL IVPB (has no administration in time range)  dextrose 50 % solution 50 mL (0 mLs Intravenous Hold 06/08/19 2045)  dextrose 50 % solution 50 mL (50 mLs Intravenous Given 06/08/19 1956)  sodium chloride 0.9 % bolus 500 mL (500 mLs Intravenous New Bag/Given 06/08/19 2007)     Initial Impression / Assessment and Plan / ED Course  I have reviewed the triage vital signs and the nursing notes.  Pertinent labs & imaging results that were available during my care of the patient were reviewed by me and considered in my medical decision making (see chart for  details).  Clinical Course as of Jun 07 2126  Wed Jun 08, 2019  2059 Hospitalist   [BM]    Clinical Course User Index [BM] Deliah Boston, [place]   68 year old chronically ill-appearing female who has just been diagnosed with COVID-19 virus arrives via EMS hyperglycemic, confused and tachycardic.  She moves bilateral extremities equally and follows commands, no cranial nerve deficits on examination.  Suspect patient's confusion likely secondary to hypoglycemia today however she does have a history of encephalopathy.  Will start patient on a small fluid bolus as she does have history of CHF and obtain CBG here.  1 ampoule D50 ordered. - CBC shows leukocytosis of 29.6 with left shift, up from 15.1 yesterday Ammonia within normal limits CMP creatinine 1.67, improved from prior nonacute PT/INR elevated CBG 41 Lactic 1.2 APTT 49 Blood cultures pending Chest x-ray:  IMPRESSION:  1. No active cardiopulmonary disease.  2. Aortic atherosclerosis.  - First ampule of D50 given with improvement of CBG to 121.  Patient reassessed resting comfortably no acute distress remains mildly tachycardic.  She states understanding of care plan and need for admission.  Patient with leukocytosis, meets sepsis criteria which has been activated.  Will need to avoid fluid overload as patient with history of CHF.  She has been started on broad-spectrum antibiotics, originally cefepime/vancomycin for coverage of community-acquired pneumonia as well as for possible MRSA of her chronic abdominal wound.  Awaiting urinalysis. - Discussed case with pharmacist who has changed medications to ertapenem along with vancomycin.  Consult called to hospitalist service for admission. - On reevaluation patient resting comfortably and in no acute distress.  She has no new concerns and reports she is feeling improved. - Discussed case with hospitalist Dr. Denton Brick who will be seeing patient for admission.  Case was discussed with  Dr. Lita Mains during this visit who agrees with work-up and admission.  [place] HANA TRIPPETT was evaluated in Emergency Department on 06/08/2019 for the symptoms described in the history of present illness. She was evaluated in the context of the global COVID-19 pandemic, which necessitated consideration that the patient might be at risk for infection with the SARS-CoV-2 virus that causes COVID-19. Institutional protocols and algorithms that pertain to the evaluation of patients at risk for COVID-19 are in a state of rapid change based  on information released by regulatory bodies including the CDC and federal and state organizations. These policies and algorithms were followed during the patient's care in the ED.   Note: Portions of this report may have been transcribed using voice recognition software. Every effort was made to ensure accuracy; however, inadvertent computerized transcription errors may still be present. Final Clinical Impressions(s) / ED Diagnoses   Final diagnoses:  Sepsis, due to unspecified organism, unspecified whether acute organ dysfunction present ([place])  COVID-19 virus detected  Chronic wound infection of abdomen, sequela    ED Discharge Orders    None       Gari Crown 06/08/19 2203    Julianne Rice, MD 06/08/19 2302

## 2019-06-08 NOTE — Progress Notes (Signed)
This encounter was created in error - please disregard.

## 2019-06-08 NOTE — H&P (Addendum)
History and Physical    [name] TIR:[id number] DOB: 11-07-1950 DOA: 06/08/2019  PCP: [name], MD   Patient coming from: [facility] rehab facility  I have personally briefly reviewed patient's old medical records in [place]  Chief Complaint: Altered mental status  HPI: [name] is a 68 y.o. female with medical history significant for pyoderma gangrenosum, MRSA infection, diastolic CHF, atrial fibrillation, COPD, CKD 3, hypertension.  Patient was sent to the ED via EMS with reports of altered mental status and hypoglycemia.  At the time of my evaluation patient appears mildly confused, answers some questions appropriately. Patient tested positive for COVID-19 virus yesterday 10/20.  She reports mild cough and mild difficulty breathing, with sore throat.  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  Patient reports pain from her abdominal wound but she is unable to tell me if her drainage is worse.  She is unable to endorse or deny dysuria.  Recent prolonged hospitalization 9/21-10/7-patient was managed for acute encephalopathy exact etiology not identified.  Patient had a PICC line placed, and completed 3 weeks course of IV Invanz on 10/3, for her chronic abdominal wounds.  ED Course: Temperature 96.8, initial heart rate 118, blood pressure systolic 676-1 23.  O2 sats greater than 92% on room air.  WBC 29, serum bicarb 18.  Creatinine 2.2.  INR 2.2.  Port chest x-ray negative for acute abnormality.  UA pending.  Normal lactic acid 1.2.  Review of Systems: As per HPI all other systems reviewed and negative.  Past Medical History:  Diagnosis Date  . Atrial fibrillation ([place])   . CHF (congestive heart failure) ([place])   . Depression   . Diabetes mellitus without complication ([place])   . 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 Hypertension   . Morbid obesity ([place])   . Osteoporosis   .  Urinary incontinence   . UTI (lower urinary tract infection) 01/2016   Cipro for Klebsiella pneumoniae isolate  . Wheelchair bound    bedbound    Past Surgical History:  Procedure Laterality Date  . ABDOMINAL HYSTERECTOMY    . APPLICATION OF WOUND VAC  03/09/2019   Procedure: APPLICATION OF WOUND VAC;  Surgeon: Virl Cagey, MD;  Location: [facility];  Service: General;;  . BIOPSY  09/06/2018   Procedure: BIOPSY;  Surgeon: Danie Binder, MD;  Location: [facility];  Service: Endoscopy;;  gastric bx's  . CESAREAN SECTION    . CHOLECYSTECTOMY    . ESOPHAGEAL DILATION  09/06/2018   Procedure: ESOPHAGEAL DILATION;  Surgeon: Danie Binder, MD;  Location: [facility];  Service: Endoscopy;;  . ESOPHAGOGASTRODUODENOSCOPY (EGD) WITH PROPOFOL N/A 09/06/2018   Procedure: ESOPHAGOGASTRODUODENOSCOPY (EGD) WITH PROPOFOL;  Surgeon: Danie Binder, MD;  Location[facility];  Service: Endoscopy;  Laterality: N/A;  dilatation  . FEMUR IM NAIL Left 02/20/2016  . FEMUR IM NAIL Left 02/20/2016   Procedure: INTRAMEDULLARY (IM) RETROGRADE FEMORAL NAILING;  Surgeon: Leandrew Koyanagi, MD;  Location: [place];  Service: Orthopedics;  Laterality: Left;  . PANCREAS SURGERY  1967   1 cyst excised and one cyst drained  . TEE WITHOUT CARDIOVERSION N/A 04/05/2018   Procedure: TRANSESOPHAGEAL ECHOCARDIOGRAM (TEE);  Surgeon: Dorothy Spark, MD;  Location: [place];  Service: Cardiovascular;  Laterality: N/A;  . [place]    . WOUND DEBRIDEMENT N/A 03/09/2019   Procedure: EXCISIONAL DEBRIDEMENT  OF ABDOMINAL WOUND ULCERS;  Surgeon: Virl Cagey, MD;  Location: [facility];  Service: General;  Laterality: N/A;  . WRIST FRACTURE SURGERY       reports that she has never smoked. She has never used smokeless tobacco. She reports that she does not drink alcohol or use drugs.  Allergies  Allergen Reactions  . Feraheme [Ferumoxytol] Other (See Comments)    Back pain (yelling out with back pain)   . Propranolol Swelling    Pt states it may have been leg swelling  . Topamax [Topiramate] Other (See Comments)    hallucinations  . Latex Itching and Rash    Family History  Problem Relation Age of Onset  . Diabetes Mother        died in her 54's of a stroke  . Cancer Mother   . Stroke Mother   . Breast cancer Mother   . Diabetes Father        died in his 26's of a stroke.  . Stroke Father   . Diabetes Brother        died @ 35 of a stroke.  . Stroke Brother   . Diabetes Maternal Grandmother   . Diabetes Maternal Grandfather   . Diabetes Paternal Grandmother   . Diabetes Paternal Grandfather   . Breast cancer Maternal Aunt     Prior to Admission medications   Medication Sig Start Date End Date Taking? Authorizing Provider  albuterol (PROVENTIL HFA;VENTOLIN HFA) 108 (90 Base) MCG/ACT inhaler Inhale 2 puffs into the lungs every 6 (six) hours as needed for wheezing or shortness of breath. 08/14/18   Kathie Dike, MD  Amino Acids-Protein Hydrolys (FEEDING SUPPLEMENT, PRO-STAT SUGAR FREE 64,) LIQD Take 30 mLs by mouth 3 (three) times daily. 05/25/19   Orson Eva, MD  apixaban (ELIQUIS) 5 MG TABS tablet Take 1 tablet (5 mg total) by mouth 2 (two) times daily. 07/21/18   Herminio Commons, MD  Ascorbic Acid (VITAMIN C) 1000 MG tablet Take 1,000 mg by mouth daily.    [provider]  bisacodyl (DULCOLAX) 10 MG suppository Place 10 mg rectally as needed for moderate constipation.    [provider]  calcium carbonate (TUMS EX) 750 MG chewable tablet Chew 1 tablet by mouth 2 (two) times daily between meals.    [provider]  cefTRIAXone 1 g/10 mLs in sterile water (preservative free) injection Inject 1 g into the vein daily at 6 (six) AM.    [provider]  diltiazem (CARDIZEM CD) 120 MG 24 hr capsule Take 1 capsule (120 mg total) by mouth daily. 09/10/18   Johnson, Clanford L, MD  ergocalciferol (VITAMIN D2) 1.25 MG (50000 UT) capsule Take 50,000  Units by mouth once a week. Wednesdays    [provider]  ferrous sulfate (FERROUSUL) 325 (65 FE) MG tablet Take 1 tablet (325 mg total) by mouth 2 (two) times daily with a meal. 01/16/19   Domenic Polite, MD  fluticasone [facility]) 50 MCG/ACT nasal spray Place 2 sprays into both nostrils daily as needed for allergies or rhinitis (congestion).     [provider]  HYDROcodone-acetaminophen (NORCO) 5-325 MG tablet Take 1 tablet by mouth every 6 (six) hours. 06/01/19   Hennie Duos, MD  insulin aspart (NOVOLOG PENFILL) cartridge Inject 6 Units into the skin 3 (three) times daily with meals.    [provider]  Insulin Glargine (LANTUS SOLOSTAR) 100 UNIT/ML Solostar Pen Inject 10 Units into the skin at bedtime.  [provider]  levalbuterol Penne Lash) 0.63 MG/3ML nebulizer solution Take 0.63 mg by nebulization every 6 (six) hours as needed for wheezing or shortness of breath.  02/08/19   [provider]  metoprolol tartrate (LOPRESSOR) 50 MG tablet Take 50 mg by mouth 2 (two) times daily.  02/12/19   [provider]  NON FORMULARY Diet: Clear liquid diet x 24 hours 06/07/19 06/08/19  [provider]  nutrition supplement, JUVEN, (JUVEN) PACK Take 1 packet by mouth 2 (two) times daily between meals. 05/25/19   Orson Eva, MD  ondansetron (ZOFRAN) 4 MG tablet Take 4 mg by mouth. Before meals for nausea    [provider]  pantoprazole (PROTONIX) 40 MG tablet Take 1 tablet (40 mg total) by mouth daily before breakfast. 09/11/18   Johnson, Clanford L, MD  sennosides-docusate sodium (SENOKOT-S) 8.6-50 MG tablet Take 1 tablet by mouth at bedtime.    [provider]  silver nitrate applicators 98-92 % applicator Apply 1 Stick topically as needed for bleeding.    [provider]  sodium chloride 0.9 % infusion Inject 100 mLs into the vein continuous.    [provider]  SPIRIVA RESPIMAT 1.25 MCG/ACT AERS Inhale  1 puff into the lungs daily.  05/01/19   [provider]  zinc gluconate 50 MG tablet Take 50 mg by mouth daily.    [provider]    Physical Exam: Vitals:   06/08/19 1900 06/08/19 1930 06/08/19 1945 06/08/19 2030  BP: 107/72 (!) 114/91  100/65  Pulse: (!) 57 (!) 57 100 92  Resp: 15 (!) 25 19 17   Temp:      TempSrc:      SpO2: 99% 95% 93% 100%  Weight:      Height:        Constitutional: calm, comfortable Vitals:   06/08/19 1900 06/08/19 1930 06/08/19 1945 06/08/19 2030  BP: 107/72 (!) 114/91  100/65  Pulse: (!) 57 (!) 57 100 92  Resp: 15 (!) 25 19 17   Temp:      TempSrc:      SpO2: 99% 95% 93% 100%  Weight:      Height:       Eyes: PERRL, lids and conjunctivae normal ENMT: Mucous membranes are moist. Posterior pharynx clear of any exudate or lesions.  Neck: normal, supple, no masses, no thyromegaly Respiratory: clear to auscultation bilaterally, no wheezing, no crackles. Normal respiratory effort. No accessory muscle use.  Cardiovascular: Regular rate and rhythm, no murmurs / rubs / gallops. No extremity edema. 2+ pedal pulses.   Abdomen: Moderate abdominal tenderness around abdominal wound, wet-to-dry dressing present.  Open wound to the lower abdomen, yellowish and serosanguineous drainage present.  Bowel sounds positive.  Musculoskeletal: no clubbing / cyanosis. No joint deformity upper and lower extremities. Good ROM, no contractures. Normal muscle tone.  Skin: no rashes, lesions, ulcers. No induration Neurologic: CN 2-12 grossly intact. Sensation intact, DTR normal. Strength 5/5 in all 4.  Psychiatric: Alert and oriented to person and place only.  Labs on Admission: I have personally reviewed following labs and imaging studies  CBC: Recent Labs  Lab 06/07/19 1208 06/08/19 1850  WBC 15.1* 29.6*  NEUTROABS 12.0* 27.2*  HGB 10.6* 9.4*  HCT 37.4 31.6*  MCV 91.4 87.1  PLT 526* 119*   Basic Metabolic Panel: Recent Labs  Lab 06/07/19 1208  06/08/19 0600 06/08/19 1850  NA 131* 132* 132*  K 6.5* 4.0 3.6  CL 101 105 107  CO2  16* 18* 18*  GLUCOSE 305* 158* 61*  BUN 65* 62* 56*  CREATININE 2.24* 2.20* 1.67*  CALCIUM 7.9* 7.5* 7.2*  MG 1.5* 4.6*  --     Liver Function Tests: Recent Labs  Lab 06/07/19 1208 06/08/19 1850  AST 27 18  ALT 14 17  ALKPHOS 132* 131*  BILITOT 0.6 0.3  PROT 5.6* 5.1*  ALBUMIN 2.0* 1.7*    Recent Labs  Lab 06/08/19 1850  AMMONIA 29   Coagulation Profile: Recent Labs  Lab 06/08/19 1850  INR 2.2*   CBG: Recent Labs  Lab 06/08/19 1831 06/08/19 1947 06/08/19 2026  GLUCAP 56* 41* 121*     Radiological Exams on Admission: Dg Chest Portable 1 View  Result Date: 06/08/2019 CLINICAL DATA:  Altered mental status. COVID. EXAM: PORTABLE CHEST 1 VIEW COMPARISON:  Chest x-ray dated 05/15/2019 FINDINGS: The heart size and pulmonary vascularity are normal the lungs are clear. No effusions. No acute bone abnormality. Aortic atherosclerosis. IMPRESSION: 1. No active cardiopulmonary disease. 2. Aortic atherosclerosis. Electronically Signed   By: Lorriane Shire M.D.   On: 06/08/2019 19:43    EKG: Independently reviewed.  Atrial fibrillation.  T wave abnormalities are mostly unchanged compared to prior EKG.  Assessment/Plan Principal Problem:   Acute encephalopathy   Metabolic encephalopathy with tachycardia, mild hypothermia temperature 96.8, soft blood pressure, leukocytosis 29.  Meeting SIRs criteria with normal lactic acid. Portable chest x-ray negative for acute abnormality.  COVID-19 positive.  Degree of leukocytosis and increased absolute neutrophil count suggests bacterial infection.  Hypoglycemia likely contributing.  History of MRSA infection.  Recently completed 3 weeks of Invanz for abdominal wall infection. -Continue IV vancomycin and ertapenem started in the ED -BMP, CBC a.m. -Follow-up UA -Follow-up blood and urine cultures ordered in ED -Talked to general surgery on-call at  [facility], Dr. Constance Haw, who is familiar with the patient, recommended getting abdominal CT to rule out pockets of abscess, or infection that could be etiology of sepsis. - Cont N/s 100cc/hr x 15 hrs - PRN Morphine 33m  Pyoderma gangrenosum involving anterior abdominal wall-cultures 9/5 grew ESBL E. coli.  Completed 3 weeks of IV ertapenem 10/3.  On high-dose steroids.  Now with leukocytosis of 29.  Follows with Dr. BConstance Haw -CT abdomen/pelvis, pending results, consider surgical consult in a.m. -Continue wet-to-dry dressings -N.p.o. midnight  COVID-19 infection- reports mild cough difficulty breathing, sore throat.  Possible chest x-ray negative for acute abnormality.  Currently on room air, greater than 95%. -Obtain inflammatory markers and repeat in a.m -COVID-19 admission protocol  Acute kidney injury on chronic kidney disease 3- creatinine 1.6 baseline over the past 3 weeks 0.8-1.3.  Appears to be improving from 2.2 yesterday.  Likely related to SIRS. -Hydrate -BMP a.m.  Diabetes mellitus with hypoglycemia- likely medication induced in the setting of probable infection.  Blood glucose down to 41.  Home medications include Lantus 10 units nightly NovoLog 6 units 3 times daily. -Home insulins -Monitor CBGs closely  Diastolic CHF-compensated.  Last echocardiogram 12/2018 EF 50 to 516% LV diastolic parameters consistent with pseudonormalization.  Not on Diuretics.  COPD-stable. -As needed inhalers  Atrial fibrillation- heart rate initially 118, 500 mill normal saline bolus given. -Hold Home Eliquis for now pending CT probable General surgery evaluation. -Hold home metoprolol and Cardizem with soft blood pressure.   DVT prophylaxis: SCDs Code Status: Full code Family Communication: None at bedside Disposition Plan: Per rounding team Consults called: [facility] will [facility] results of CT abdomen/pelvis Admission status: Inpatient,  telemetry I certify that at the point of admission it is my clinical judgment that the patient will require inpatient hospital care spanning beyond 2 midnights from the point of admission due to high intensity of service, high risk for further deterioration and high frequency of surveillance required. The following factors support the patient status of inpatient: Altered mental status on presentation suggesting infectious etiology, requiring broad-spectrum antibiotics.   Bethena Roys MD [facility]  06/08/2019, 10:57 PM

## 2019-06-08 NOTE — ED Triage Notes (Signed)
Patient comes to the ED via [facility] from the [facility] center due to altered mental status and hypoglycemia. Patient with noted abdominal wound covered with wet to dry dressing.  Patient is alert.  CBG upon arrival 15.

## 2019-06-08 NOTE — Progress Notes (Signed)
Location:  [facility] Room Number: 158-P Place of Service:  SNF (31)  [name], MD  Patient Care Team: [name], MD as PCP - General (Internal Medicine) Herminio Commons, MD as PCP - Cardiology (Cardiology) Michel Bickers, MD as Consulting Physician (Infectious Diseases) Derek Jack, MD as Consulting Physician (Hematology) Virl Cagey, MD as Consulting Physician (General Surgery)  Extended Emergency Contact Information Primary Emergency Contact: Jaimes,John R Address: [address]          [place], [place] [address] [place] Phone: [phone] Mobile Phone: [phone] Relation: Spouse    Allergies: Feraheme [ferumoxytol], Propranolol, Topamax [topiramate], and Latex  Chief Complaint  Patient presents with   Acute Visit    Patient seen for COVID+    HPI: Patient is 68 y.o. female who is being seen for new onset Covid, which was diagnosed this morning and confirmed with PCR testing.  Today would be day 14 of patients I am isolation.  From coming from the hospital.  Nursing reports that patient started feeling bad over the weekend and the patient herself admits that she started feeling bad over the last 3 to 4 days with more nausea than usual occasional vomiting and diarrhea.  Patient denied any respiratory symptoms.  Patient was noted to be drinking and eating less and when I asked the patient if food tasted bad she said yes and she admitted that it tasted bitter, which is something of her other Covid positive patients say.  Patient is currently getting IV fluids and her urine was noted to be look like orange juice.  Past Medical History:  Diagnosis Date   Atrial fibrillation (HCC)    CHF (congestive heart failure) ([place])    Depression    Diabetes mellitus without complication ([place])    History of transesophageal echocardiography (TEE) 03/2018   LV thrombus   Hypertension     Morbid obesity ([place])    Osteoporosis    Urinary incontinence    UTI (lower urinary tract infection) 01/2016   Cipro for Klebsiella pneumoniae isolate   Wheelchair bound    bedbound    Past Surgical History:  Procedure Laterality Date   ABDOMINAL HYSTERECTOMY     APPLICATION OF WOUND VAC  03/09/2019   Procedure: APPLICATION OF WOUND VAC;  Surgeon: Virl Cagey, MD;  Location: [facility] ORS;  Service: General;;   BIOPSY  09/06/2018   Procedure: BIOPSY;  Surgeon: Danie Binder, MD;  Location: [facility];  Service: Endoscopy;;  gastric bx's   CESAREAN SECTION     CHOLECYSTECTOMY     ESOPHAGEAL DILATION  09/06/2018   Procedure: ESOPHAGEAL DILATION;  Surgeon: Danie Binder, MD;  Location: [facility];  Service: Endoscopy;;   ESOPHAGOGASTRODUODENOSCOPY (EGD) WITH PROPOFOL N/A 09/06/2018   Procedure: ESOPHAGOGASTRODUODENOSCOPY (EGD) WITH PROPOFOL;  Surgeon: Danie Binder, MD;  Location: [facility];  Service: Endoscopy;  Laterality: N/A;  dilatation   FEMUR IM NAIL Left 02/20/2016   FEMUR IM NAIL Left 02/20/2016   Procedure: INTRAMEDULLARY (IM) RETROGRADE FEMORAL NAILING;  Surgeon: Leandrew Koyanagi, MD;  Location: [facility];  Service: Orthopedics;  Laterality: Left;   PANCREAS SURGERY  1967   1 cyst excised and one cyst drained   TEE WITHOUT CARDIOVERSION N/A 04/05/2018   Procedure: TRANSESOPHAGEAL ECHOCARDIOGRAM (TEE);  Surgeon: Dorothy Spark, MD;  Location: [facility];  Service: Cardiovascular;  Laterality: N/A;   [place]  N/A 03/09/2019   Procedure: EXCISIONAL DEBRIDEMENT OF ABDOMINAL WOUND ULCERS;  Surgeon: Virl Cagey, MD;  Location: [facility];  Service: General;  Laterality: N/A;   WRIST FRACTURE SURGERY      Allergies as of 06/08/2019      Reactions   Feraheme [ferumoxytol] Other (See Comments)   Back pain (yelling out with back pain)   Propranolol Swelling   Pt states it may have been leg swelling   Topamax  [topiramate] Other (See Comments)   hallucinations   Latex Itching, Rash      Medication List    Notice   This visit is during an admission. Changes to the med list made in this visit will be reflected in the After Visit Summary of the admission.     No orders of the defined types were placed in this encounter.   Immunization History  Administered Date(s) Administered   Influenza, High Dose Seasonal PF 04/23/2018   PPD Test 02/22/2016   Td 01/08/2018    Social History   Tobacco Use   Smoking status: Never Smoker   Smokeless tobacco: Never Used  Substance Use Topics   Alcohol use: No    Review of Systems  DATA OBTAINED: from patient, nurse GENERAL:  no fevers, fatigue, very decreased appetite and poor p.o. intake SKIN: No itching, rash HEENT: No complaint RESPIRATORY: No cough, wheezing, SOB CARDIAC: No chest pain, palpitations, lower extremity edema  GI: No abdominal pain, +N/V/D for past several days, No heartburn or reflux  GU: No dysuria, frequency or urgency, or incontinence  MUSCULOSKELETAL: No unrelieved bone/joint pain NEUROLOGIC: No headache, dizziness  PSYCHIATRIC: No overt anxiety or sadness  Vitals:   06/08/19 1424  BP: 102/63  Pulse: 60  Resp: 20  Temp: (!) 97.4 F (36.3 C)   Body mass index is 29.58 kg/m. Physical Exam  GENERAL APPEARANCE: Alert, conversant, No acute distress  SKIN: Abdominal wound at baseline HEENT: Unremarkable RESPIRATORY: Breathing is even, unlabored. Lung sounds are clear   CARDIOVASCULAR: Heart RRR no murmurs, rubs or gallops. No peripheral edema  GASTROINTESTINAL: Abdomen is soft, non-tender, not distended w/ normal bowel sounds.  GENITOURINARY: Bladder non tender, not distended  MUSCULOSKELETAL: No abnormal joints or musculature NEUROLOGIC: Cranial nerves 2-12 grossly intact. Moves all extremities PSYCHIATRIC: Mood and affect appropriate to situation, no behavioral issues  Patient Active Problem List    Diagnosis Date Noted   Acute renal failure superimposed on stage 3 chronic kidney disease ([place]) 06/11/2019   Hypomagnesemia 06/11/2019   Hyperkalemia 06/11/2019   Chronic constipation 06/11/2019   Acute delirium 06/03/2019   Hypertension associated with type 2 diabetes mellitus ([place]) 05/31/2019   GERD without esophagitis 05/31/2019   CKD stage 3 due to type 2 diabetes mellitus ([place]) 05/31/2019   Anemia due to stage 3a chronic kidney disease 05/31/2019   Protein-calorie malnutrition, severe ([place]) 05/31/2019   Acute encephalopathy 05/25/2019   Atrial fibrillation with tachycardic ventricular rate ([place]) 04/28/2019   Uncontrolled type 2 diabetes mellitus with chronic kidney disease, with long-term current use of insulin ([place]) 04/22/2019   Pyoderma gangrenosum 03/11/2019   Wound infection 03/09/2019   Chronic wound infection of abdomen    Wheelchair bound    Abdominal wall skin ulcer ([place]) 03/07/2019   Acute pulmonary edema ([place]) 02/17/2019   Iron deficiency anemia 02/01/2019   Abdominal wall ulcer, with fat layer exposed ([place]) 01/16/2019   Elevated troponin    Nausea and vomiting    Symptomatic bradycardia 01/10/2019   DKA (  diabetic ketoacidoses) ([place]) 01/10/2019   Urinary incontinence 12/28/2018   Recurrent urinary tract infection 12/16/2018   Wound, open, anterior abdominal wall 12/05/2018   Hypotension due to hypovolemia    Gastritis due to nonsteroidal anti-inflammatory drug    Esophageal dysphagia    GI bleed 09/03/2018   Coagulopathy ([place])    Colitis    Lower abdominal pain    Rectal bleeding    Dehydration 08/16/2018   Acute on chronic diastolic CHF (congestive heart failure) ([place]) 08/10/2018   Acute lower UTI 08/10/2018   Hypokalemia 08/10/2018   COPD (chronic obstructive pulmonary disease) ([place]) 08/10/2018   Thyroid lesion 08/10/2018   Closed fracture of right ankle 06/10/2018   Closed fracture of left tibia and fibula with  routine healing 04/13/18 06/10/2018   Atrial fibrillation ([place]) 04/15/2018   Chronic diastolic HF (heart failure) ([place]) 04/15/2018   CKD (chronic kidney disease), stage III 04/15/2018   Left tibial fracture 04/14/2018   CHF exacerbation (HCC) 04/10/2018   SOB (shortness of breath)    Acute CHF (congestive heart failure) ([place]) 04/01/2018   Atrial fibrillation with RVR ([place]) 04/01/2018   [place] 03/11/2016   Muscle spasms of lower extremity 03/04/2016   Acute renal insufficiency 02/26/2016   History of MRSA infection 02/26/2016   HTN (hypertension) 02/19/2016   Fracture, femur, distal ([place]) 02/19/2016   Fall 02/19/2016   Depression 02/19/2016   Femur fracture, left ([place]) 02/19/2016    CMP     Component Value Date/Time   NA 140 06/11/2019 0834   K 3.3 (L) 06/11/2019 0834   CL 113 (H) 06/11/2019 0834   CO2 19 (L) 06/11/2019 0834   GLUCOSE 289 (H) 06/11/2019 0834   BUN 30 (H) 06/11/2019 0834   CREATININE 0.83 06/11/2019 0834   CREATININE 0.81 01/25/2019 1354   CALCIUM 7.5 (L) 06/11/2019 0834   PROT 4.2 (L) 06/11/2019 0834   ALBUMIN 1.2 (L) 06/11/2019 0834   AST 22 06/11/2019 0834   AST 7 (L) 01/25/2019 1354   ALT 20 06/11/2019 0834   ALT <6 01/25/2019 1354   ALKPHOS 123 06/11/2019 0834   BILITOT 0.4 06/11/2019 0834   BILITOT 0.7 01/25/2019 1354   GFRNONAA >60 06/11/2019 0834   GFRNONAA >60 01/25/2019 1354   GFRAA >60 06/11/2019 0834   GFRAA >60 01/25/2019 1354   Recent Labs    06/07/19 1208 06/08/19 0600  06/09/19 0500 06/10/19 0440 06/11/19 0834  NA 131* 132*   < > 139 138 140  K 6.5* 4.0   < > 3.3* 3.5 3.3*  CL 101 105   < > 115* 113* 113*  CO2 16* 18*   < > 17* 17* 19*  GLUCOSE 305* 158*   < > 84 117* 289*  BUN 65* 62*   < > 46* 39* 30*  CREATININE 2.24* 2.20*   < > 1.32* 1.05* 0.83  CALCIUM 7.9* 7.5*   < > 6.6* 7.0* 7.5*  MG 1.5* 4.6*  --   --   --  2.0   < > = values in this interval not displayed.   Recent Labs    06/08/19 1850  06/10/19 0440 06/11/19 0834  AST 18 22 22   ALT 17 18 20   ALKPHOS 131* 117 123  BILITOT 0.3 0.4 0.4  PROT 5.1* 4.0* 4.2*  ALBUMIN 1.7* 1.3* 1.2*   Recent Labs    06/08/19 1850 06/10/19 0440 06/11/19 0834  WBC 29.6* 22.5* 15.3*  NEUTROABS 27.2* 20.4* 13.6*  HGB 9.4* 7.3* 7.5*  HCT 31.6* 24.0* 24.5*  MCV 87.1 86.3 86.3  PLT 521* 329 294   Recent Labs    06/11/19 0451  CHOL 53  LDLCALC 25  TRIG 86   No results found for: [facility] Lab Results  Component Value Date   TSH 0.456 06/10/2019   Lab Results  Component Value Date   HGBA1C 7.4 (H) 06/11/2019   Lab Results  Component Value Date   CHOL 53 06/11/2019   HDL 11 (L) 06/11/2019   LDLCALC 25 06/11/2019   TRIG 86 06/11/2019   CHOLHDL 4.8 06/11/2019    Significant Diagnostic Results in last 30 days:  Ct Abdomen Pelvis Wo Contrast  Result Date: 06/09/2019 CLINICAL DATA:  Chronic draining abdominal wound, pyoderma gangrenosum, leukocytosis 29, altered mental status, sepsis. EXAM: CT ABDOMEN AND PELVIS WITHOUT CONTRAST TECHNIQUE: Multidetector CT imaging of the abdomen and pelvis was performed following the standard protocol without IV contrast. COMPARISON:  Contrast-enhanced CT 04/22/2019, additional priors. FINDINGS: Lower chest: Clustered nodular ground-glass opacities in the right lower lobe with associated bronchial thickening. Findings are new from prior exam. Mild left lower lobe bronchial thickening. Cardiomegaly with coronary artery calcifications. Mitral annulus calcifications. Hepatobiliary: Stable biliary dilatation postcholecystectomy. No evidence of focal hepatic abnormality on noncontrast exam. Pancreas: Parenchymal atrophy. No ductal dilatation or inflammation. Spleen: Calcified granuloma. Normal in size. Adrenals/Urinary Tract: No adrenal nodule. Mild bilateral renal parenchymal thinning. No hydronephrosis. Mild bilateral renal cortical scarring. Small hypodense lesion in the left kidney on prior exam not  as well-defined in the absence of IV contrast. Partially distended urinary bladder. Mild wall thickening inferiorly. Stomach/Bowel: Bowel evaluation limited in the absence of enteric contrast. Small hiatal hernia. Small duodenal diverticulum. No bowel obstruction or inflammatory change. Appendix not confidently visualized. Mild distal colonic diverticulosis without diverticulitis. Vascular/Lymphatic: Advanced aortic and branch atherosclerosis. No aortic aneurysm. No enlarged lymph nodes in the abdomen or pelvis. Reproductive: Uterus is surgically absent. Right adnexal ovarian cyst measures 4.2 cm, not significantly changed allowing for differences in caliper placement. Other: Debridement of the right lower quadrant subcutaneous soft tissues. Previous fluid collection in the subcutaneous fat is no longer seen. No residual or recurrent fluid collection. No significant phlegm a tori change. No tracking soft tissue gas to suggest acute infection. No free fluid or free air in the abdomen. Small fat containing umbilical hernia. Musculoskeletal: There are no acute or suspicious osseous abnormalities. Bones are under mineralized. Mild diffuse fatty atrophy of included musculature. IMPRESSION: 1. Resolved fluid collection in the subcutaneous tissues of the lower abdominal wall. Residual skin irregularity but no soft tissue thickening or inflammatory change. No recurrent or new abscess. 2. Clustered nodular ground-glass opacities in the right lower lobe with associated bronchial thickening, suspicious for aspiration. Or pneumonia also considered. 3. Mild bladder wall thickening inferiorly, can be seen with urinary tract infection. Recommend correlation with urinalysis. 4. Colonic diverticulosis without diverticulitis. Aortic Atherosclerosis (ICD10-I70.0). Electronically Signed   By: Keith Rake M.D.   On: 06/09/2019 00:09   Ct Head Wo Contrast  Result Date: 05/17/2019 CLINICAL DATA:  Altered mental status EXAM: CT  HEAD WITHOUT CONTRAST TECHNIQUE: Contiguous axial images were obtained from the base of the skull through the vertex without intravenous contrast. COMPARISON:  None. FINDINGS: Brain: Examination is significantly limited by motion artifact throughout. No evidence of acute infarction, hemorrhage, hydrocephalus, extra-axial collection or mass lesion/mass effect. Mild periventricular white matter hypodensity. Vascular: No hyperdense vessel or unexpected calcification. Skull: Normal. Negative for fracture or focal lesion. Sinuses/Orbits: No acute  finding. Other: None. IMPRESSION: Examination is significantly limited by motion artifact throughout. Within this limitation, no acute intracranial pathology. Small-vessel white matter disease. Electronically Signed   By: Eddie Candle M.D.   On: 05/17/2019 11:48   Dg Chest Portable 1 View  Result Date: 06/08/2019 CLINICAL DATA:  Altered mental status. COVID. EXAM: PORTABLE CHEST 1 VIEW COMPARISON:  Chest x-ray dated 05/15/2019 FINDINGS: The heart size and pulmonary vascularity are normal the lungs are clear. No effusions. No acute bone abnormality. Aortic atherosclerosis. IMPRESSION: 1. No active cardiopulmonary disease. 2. Aortic atherosclerosis. Electronically Signed   By: Lorriane Shire M.D.   On: 06/08/2019 19:43   Dg Chest Port 1 View  Addendum Date: 05/15/2019   ADDENDUM REPORT: 05/15/2019 11:10 ADDENDUM: The technologist reversed the image and incorrectly labeled the laterality on the image obtained at 1025 hours. A repeat x-ray was obtained at 1051 hours and appropriately labeled. With this new information, it is clear that the patient has a left PICC line, not a right PICC line. The left PICC line tip overlies the right cardiomediastinal silhouette, superimposed on the region of the distal SVC near the junction with the RA. Cardiopericardial silhouette is stable in size. Pulmonary edema pattern again noted. Bones are diffusely demineralized with chronic  posttraumatic deformity in the proximal left humerus. IMPRESSION: Left PICC line tip is superimposed on the expected location of the distal SVC. Electronically Signed   By: Misty Stanley M.D.   On: 05/15/2019 11:10   Result Date: 05/15/2019 CLINICAL DATA:  PICC line placement EXAM: PORTABLE CHEST 1 VIEW COMPARISON:  05/14/2019 FINDINGS: 1025 hours. Patient is markedly rotated to the left. The left PICC line seen previously is no longer evident and a right PICC line is now visible. The tip of the right PICC line projects over the left cardiomediastinal shadow on this exam. Given the marked rotation, tip position cannot be localized. Cardiopericardial silhouette is enlarged. Similar pulmonary edema pattern. No substantial pleural effusion. IMPRESSION: Right PICC line is new in the interval. Tip position cannot be adequately assessed given the marked leftward rotation. The tip of the PICC line projects too far to the left but given the presumed anterior positioning of the line, this may simply be related to the rotation. Repeat chest x-ray recommended with careful attention to AP positioning to better assess PICC line location. Electronically Signed: By: Misty Stanley M.D. On: 05/15/2019 10:49   Dg Chest Port 1 View  Result Date: 05/14/2019 CLINICAL DATA:  Evaluate PICC line EXAM: PORTABLE CHEST 1 VIEW COMPARISON:  May 10, 2019 FINDINGS: A left PICC line is been placed in the interval. It is difficult to see the distal if the believe it terminates in the right atrium 4.5 cm below the caval atrial junction. Stable cardiomediastinal silhouette. No pneumothorax. Mild interstitial prominence centrally. IMPRESSION: 1. The left PICC line appears to terminate in the right atrium, 4.5 cm below the caval atrial junction. Recommend repositioning. 2. Suggested mild pulmonary venous congestion. These results will be called to the ordering clinician or representative by the Radiologist Assistant, and communication  documented in the PACS or zVision Dashboard. Electronically Signed   By: Dorise Bullion III M.D   On: 05/14/2019 18:57   Dg Chest Port 1v Same Day  Result Date: 06/10/2019 CLINICAL DATA:  Shortness of breath EXAM: PORTABLE CHEST 1 VIEW COMPARISON:  06/08/2019 FINDINGS: Heart is normal size. No confluent airspace opacities or effusions. No acute bony abnormality. IMPRESSION: No acute cardiopulmonary disease. Electronically Signed  By: Rolm Baptise M.D.   On: 06/10/2019 08:40   Ct Head Code Stroke Wo Contrast  Result Date: 06/10/2019 CLINICAL DATA:  Code stroke. Subacute neuro deficits. Slurred speech EXAM: CT HEAD WITHOUT CONTRAST TECHNIQUE: Contiguous axial images were obtained from the base of the skull through the vertex without intravenous contrast. COMPARISON:  05/17/2019 FINDINGS: Brain: No evidence of acute infarction, hemorrhage, hydrocephalus, extra-axial collection or mass lesion/mass effect. 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 No hyperdense vessel. Skull: Normal. Negative for fracture or focal lesion. Sinuses/Orbits: No acute finding. Other: These results were communicated to [facility] [place] 10/23/2020by text page via the [facility] messaging system. ASPECTS [facility] Stroke Program Early CT Score) - Ganglionic level infarction (caudate, lentiform nuclei, internal capsule, insula, M1-M3 cortex): 7 - Supraganglionic infarction (M4-M6 cortex): 3 Total score (0-10 with 10 being normal): 10 IMPRESSION: 1. No acute or interval finding. ASPECTS is 10. 2. Atherosclerotic calcification. Electronically Signed   By: Monte Fantasia M.D.   On: 06/10/2019 09:00   Vas US Carotid (at [facility] Only)  Result Date: 06/10/2019 Carotid Arterial Duplex Study Indications:       CVA. Limitations        Today's exam was limited due to lighting in the room. Comparison Study:  No prior. Performing Technologist: Oda Cogan RDMS, RVT  Examination Guidelines: A complete evaluation includes B-mode imaging, spectral Doppler, color Doppler, and power Doppler as needed of all accessible portions of each vessel. Bilateral testing is considered an integral part of a complete examination. Limited examinations for reoccurring indications may be performed as noted.  Right Carotid Findings: +----------+--------+--------+--------+------------------+--------+             PSV cm/s EDV cm/s Stenosis Plaque Description Comments  +----------+--------+--------+--------+------------------+--------+  CCA Prox   85       19                                             +----------+--------+--------+--------+------------------+--------+  CCA Distal 86       17                                             +----------+--------+--------+--------+------------------+--------+  ICA Prox   50       20       1-39%    heterogenous                 +----------+--------+--------+--------+------------------+--------+  ICA Distal 57       17                                             +----------+--------+--------+--------+------------------+--------+  ECA        73       11                                             +----------+--------+--------+--------+------------------+--------+ +----------+--------+-------+------------+-------------------+             PSV cm/s  EDV cms Describe     Arm Pressure (mmHG)  +----------+--------+-------+------------+-------------------+  Subclavian                  Not assessed                      +----------+--------+-------+------------+-------------------+ +---------+--------+--------+------------+  Vertebral PSV cm/s EDV cm/s Not assessed  +---------+--------+--------+------------+  Left Carotid Findings: +----------+--------+--------+--------+------------------+--------+             PSV cm/s EDV cm/s Stenosis Plaque Description Comments  +----------+--------+--------+--------+------------------+--------+  CCA Prox   98                                                       +----------+--------+--------+--------+------------------+--------+  CCA Distal 65                                                      +----------+--------+--------+--------+------------------+--------+  ICA Prox   68       15       1-39%    heterogenous                 +----------+--------+--------+--------+------------------+--------+  ICA Distal 81       24                                             +----------+--------+--------+--------+------------------+--------+  ECA        71       7                                              +----------+--------+--------+--------+------------------+--------+ +----------+--------+--------+--------------+-------------------+             PSV cm/s EDV cm/s Describe       Arm Pressure (mmHG)  +----------+--------+--------+--------------+-------------------+  Subclavian                   Not identified                      +----------+--------+--------+--------------+-------------------+ +---------+--------+--------+------------+  Vertebral PSV cm/s EDV cm/s Not assessed  +---------+--------+--------+------------+  Summary: Right Carotid: Velocities in the right ICA are consistent with a 1-39% stenosis. Left Carotid: Velocities in the left ICA are consistent with a 1-39% stenosis. Vertebrals: Bilateral vertebral arteries were not visualized. *See table(s) above for measurements and observations.     Preliminary    Mr Brain Wo Contrast (charm Study)  Result Date: 06/11/2019 CLINICAL DATA:  Focal neuro deficit. COVID-19 positive. Slurred speech with right-sided weakness. EXAM: MRI HEAD WITHOUT CONTRAST TECHNIQUE: Multiplanar, multiecho pulse sequences of the brain and surrounding structures were obtained without intravenous contrast. COMPARISON:  CT head 06/10/2019 FINDINGS: Brain: Negative for acute infarct Generalized atrophy. Chronic microvascular ischemic changes in the white matter and pons. Negative for hemorrhage or  mass. No midline shift. Vascular: Normal arterial flow voids Skull and upper cervical spine: Negative Sinuses/Orbits: Negative Other: None IMPRESSION: Negative for  acute infarct Atrophy and moderate chronic microvascular ischemia. Electronically Signed   By: Franchot Gallo M.D.   On: 06/11/2019 11:52    Assessment and Plan  COVID-19 positive/poor p.o. intake/probable UTI/chronic wound infection of abdomen -IV fluids have been started based on patient's poor p.o. intake and poor urine output.  Also her urine was noted to be looking like orange juice so she probably does have a UTI and Rocephin 1 g IV daily is being started while a UA has been sent off for C&S sensitivities.  Patient will be monitored closely as to O2 saturation.  Obviously Covid precautions will remain in place, and patient will be followed very closely.  No problem-specific Assessment & Plan notes found for this encounter.   Labs/tests ordered:    Hennie Duos, MD

## 2019-06-09 ENCOUNTER — Ambulatory Visit: Payer: BC Managed Care – PPO | Admitting: General Surgery

## 2019-06-09 ENCOUNTER — Other Ambulatory Visit: Payer: Self-pay

## 2019-06-09 DIAGNOSIS — N183 Chronic kidney disease, stage 3 unspecified: Secondary | ICD-10-CM

## 2019-06-09 DIAGNOSIS — D72829 Elevated white blood cell count, unspecified: Secondary | ICD-10-CM

## 2019-06-09 DIAGNOSIS — S31109D Unspecified open wound of abdominal wall, unspecified quadrant without penetration into peritoneal cavity, subsequent encounter: Secondary | ICD-10-CM

## 2019-06-09 LAB — D-DIMER, QUANTITATIVE
D-Dimer, Quant: <0.27 ug/mL-FEU (ref 0.00–0.50)
D-Dimer, Quant: 0.32 ug/mL-FEU (ref 0.00–0.50)

## 2019-06-09 LAB — BASIC METABOLIC PANEL
Anion gap: 7 (ref 5–15)
BUN: 46 mg/dL — ABNORMAL HIGH (ref 8–23)
CO2: 17 mmol/L — ABNORMAL LOW (ref 22–32)
Calcium: 6.6 mg/dL — ABNORMAL LOW (ref 8.9–10.3)
Chloride: 115 mmol/L — ABNORMAL HIGH (ref 98–111)
Creatinine, Ser: 1.32 mg/dL — ABNORMAL HIGH (ref 0.44–1.00)
GFR calc Af Amer: 48 mL/min — ABNORMAL LOW (ref >60)
GFR calc non Af Amer: 41 mL/min — ABNORMAL LOW (ref >60)
Glucose, Bld: 84 mg/dL (ref 70–99)
Potassium: 3.3 mmol/L — ABNORMAL LOW (ref 3.5–5.1)
Sodium: 139 mmol/L (ref 135–145)

## 2019-06-09 LAB — GLUCOSE, CAPILLARY
Glucose-Capillary: 88 mg/dL (ref 70–99)
Glucose-Capillary: 83 mg/dL (ref 70–99)
Glucose-Capillary: 122 mg/dL — ABNORMAL HIGH (ref 70–99)
Glucose-Capillary: 106 mg/dL — ABNORMAL HIGH (ref 70–99)

## 2019-06-09 LAB — FIBRINOGEN: Fibrinogen: 347 mg/dL (ref 210–475)

## 2019-06-09 LAB — CBG MONITORING, ED
Glucose-Capillary: 105 mg/dL — ABNORMAL HIGH (ref 70–99)
Glucose-Capillary: 104 mg/dL — ABNORMAL HIGH (ref 70–99)

## 2019-06-09 LAB — C-REACTIVE PROTEIN
CRP: 11.7 mg/dL — ABNORMAL HIGH (ref <1.0)
CRP: 11.5 mg/dL — ABNORMAL HIGH (ref <1.0)

## 2019-06-09 LAB — TROPONIN I (HIGH SENSITIVITY): Troponin I (High Sensitivity): 24 ng/L — ABNORMAL HIGH (ref <18)

## 2019-06-09 LAB — ABO/RH: ABO/RH(D): A POS

## 2019-06-09 LAB — LACTATE DEHYDROGENASE: LDH: 211 U/L — ABNORMAL HIGH (ref 98–192)

## 2019-06-09 LAB — FERRITIN
Ferritin: 181 ng/mL (ref 11–307)
Ferritin: 175 ng/mL (ref 11–307)

## 2019-06-09 MED ORDER — VITAMIN C 500 MG PO TABS
1000.0000 mg | ORAL_TABLET | Freq: Every day | ORAL | Status: DC
  Administered 2019-06-11 – 2019-06-17 (×7): 1000 mg via ORAL
  Filled 2019-06-09 (×7): qty 2

## 2019-06-09 MED ORDER — ALBUTEROL SULFATE HFA 108 (90 BASE) MCG/ACT IN AERS: 2.0000 | INHALATION_SPRAY | Freq: Four times a day (QID) | RESPIRATORY_TRACT | Status: DC | PRN

## 2019-06-09 MED ORDER — ONDANSETRON HCL 4 MG/2ML IJ SOLN
4.0000 mg | Freq: Four times a day (QID) | INTRAMUSCULAR | Status: DC | PRN
  Administered 2019-06-15: 4 mg via INTRAVENOUS
  Filled 2019-06-09: qty 2

## 2019-06-09 MED ORDER — MORPHINE SULFATE (PF) 2 MG/ML IV SOLN
1.0000 mg | INTRAVENOUS | Status: DC | PRN
  Administered 2019-06-09 – 2019-06-17 (×5): 1 mg via INTRAVENOUS
  Filled 2019-06-09 (×7): qty 1

## 2019-06-09 MED ORDER — ZINC SULFATE 220 (50 ZN) MG PO CAPS
220.0000 mg | ORAL_CAPSULE | Freq: Every day | ORAL | Status: DC
  Administered 2019-06-11 – 2019-06-17 (×7): 220 mg via ORAL
  Filled 2019-06-09 (×7): qty 1

## 2019-06-09 MED ORDER — UMECLIDINIUM BROMIDE 62.5 MCG/INH IN AEPB
1.0000 | INHALATION_SPRAY | Freq: Every day | RESPIRATORY_TRACT | Status: DC
  Administered 2019-06-09 – 2019-06-17 (×8): 1 via RESPIRATORY_TRACT
  Filled 2019-06-09 (×3): qty 7

## 2019-06-09 MED ORDER — POLYETHYLENE GLYCOL 3350 17 G PO PACK: 17.0000 g | PACK | Freq: Every day | ORAL | Status: DC | PRN

## 2019-06-09 MED ORDER — ONDANSETRON HCL 4 MG PO TABS: 4.0000 mg | ORAL_TABLET | Freq: Four times a day (QID) | ORAL | Status: DC | PRN

## 2019-06-09 MED ORDER — SODIUM CHLORIDE 0.9 % IV SOLN
2.0000 g | Freq: Two times a day (BID) | INTRAVENOUS | Status: DC
  Administered 2019-06-09 – 2019-06-10 (×2): 2 g via INTRAVENOUS
  Filled 2019-06-09 (×4): qty 2

## 2019-06-09 MED ORDER — PRO-STAT SUGAR FREE PO LIQD
30.0000 mL | Freq: Three times a day (TID) | ORAL | Status: DC
  Administered 2019-06-09 – 2019-06-17 (×23): 30 mL via ORAL
  Filled 2019-06-09 (×22): qty 30

## 2019-06-09 MED ORDER — CHLORHEXIDINE GLUCONATE CLOTH 2 % EX PADS
6.0000 | MEDICATED_PAD | Freq: Every day | CUTANEOUS | Status: DC
  Administered 2019-06-09 – 2019-06-17 (×9): 6 via TOPICAL

## 2019-06-09 MED ORDER — APIXABAN 5 MG PO TABS
5.0000 mg | ORAL_TABLET | Freq: Two times a day (BID) | ORAL | Status: DC
  Administered 2019-06-09 – 2019-06-13 (×8): 5 mg via ORAL
  Filled 2019-06-09 (×9): qty 1

## 2019-06-09 MED ORDER — FLUTICASONE PROPIONATE 50 MCG/ACT NA SUSP: 2.0000 | Freq: Every day | NASAL | Status: DC | PRN

## 2019-06-09 MED ORDER — ACETAMINOPHEN 325 MG PO TABS
650.0000 mg | ORAL_TABLET | Freq: Four times a day (QID) | ORAL | Status: DC | PRN
  Administered 2019-06-12 – 2019-06-17 (×7): 650 mg via ORAL
  Filled 2019-06-09 (×7): qty 2

## 2019-06-09 NOTE — Consult Note (Signed)
[facility] Consult/Admission Note  [name] 12/23/1950  [id number].    Requesting Provider: Dr. Starla Link Chief Complaint/Reason for Consult: chronic abdominal wound  HPI:   Pt is a 68 y.o. female with medical history significant for pyoderma gangrenosum with chronic abdominal wound followed by Dr. Curlene Labrum, MRSA infection, diastolic CHF, atrial fibrillation, COPD, CKD 3, HTN who was sent to the ED via EMS with reports of altered mental status and hypoglycemia. Pt tested positive for COVID. We were asked to evaluate abdominal wound for possible debridement. Pt had excisional debridement of wound on 07/22 by Dr. Constance Haw. Path stated, "It is possible that the findings in this specimen represent the late stage of pyoderma gangrenosum". Patient had recent prolonged hospitalization 9/21-10/7- Patient had a PICC line placed, and completed 3 weeks course of IV Invanz on 10/3, for her chronic abdominal wounds.  Pt seems confused at the time of my exam. She states pain of her abdominal wound. She stated she saw Dr. Constance Haw yesterday which review of EMR showed no notes from Dr. Constance Haw. History limited.   ROS:  Review of Systems  Unable to perform ROS: Mental status change     Family History  Problem Relation Age of Onset   Diabetes Mother        died in her 24's of a stroke   Cancer Mother    Stroke Mother    Breast cancer Mother    Diabetes Father        died in his 72's of a stroke.   Stroke Father    Diabetes Brother        died @ 20 of a stroke.   Stroke Brother    Diabetes Maternal Grandmother    Diabetes Maternal Grandfather    Diabetes Paternal Grandmother    Diabetes Paternal Grandfather    Breast cancer Maternal Aunt     Past Medical History:  Diagnosis Date   Atrial fibrillation (HCC)    CHF (congestive heart failure) ([place])    Depression    Diabetes mellitus without complication ([place])    History of transesophageal  echocardiography (TEE) 03/2018   LV thrombus   Hypertension    Morbid obesity ([place])    Osteoporosis    Urinary incontinence    UTI (lower urinary tract infection) 01/2016   Cipro for Klebsiella pneumoniae isolate   Wheelchair bound    bedbound    Past Surgical History:  Procedure Laterality Date   ABDOMINAL HYSTERECTOMY     APPLICATION OF WOUND VAC  03/09/2019   Procedure: APPLICATION OF WOUND VAC;  Surgeon: Virl Cagey, MD;  Location: [facility];  Service: General;;   BIOPSY  09/06/2018   Procedure: BIOPSY;  Surgeon: Danie Binder, MD;  Location: [facility];  Service: Endoscopy;;  gastric bx's   CESAREAN SECTION     CHOLECYSTECTOMY     ESOPHAGEAL DILATION  09/06/2018   Procedure: ESOPHAGEAL DILATION;  Surgeon: Danie Binder, MD;  Location: [facility];  Service: Endoscopy;;   ESOPHAGOGASTRODUODENOSCOPY (EGD) WITH PROPOFOL N/A 09/06/2018   Procedure: ESOPHAGOGASTRODUODENOSCOPY (EGD) WITH PROPOFOL;  Surgeon: Danie Binder, MD;  Location[facility];  Service: Endoscopy;  Laterality: N/A;  dilatation   FEMUR IM NAIL Left 02/20/2016   FEMUR IM NAIL Left 02/20/2016   Procedure: INTRAMEDULLARY (IM) RETROGRADE FEMORAL NAILING;  Surgeon: Leandrew Koyanagi, MD;  Location: [place];  Service: Orthopedics;  Laterality: Left;   PANCREAS SURGERY  1967   1 cyst  excised and one cyst drained   TEE WITHOUT CARDIOVERSION N/A 04/05/2018   Procedure: TRANSESOPHAGEAL ECHOCARDIOGRAM (TEE);  Surgeon: Dorothy Spark, MD;  Location: [place];  Service: Cardiovascular;  Laterality: N/A;   VEIN LIGATION AND STRIPPING     WOUND DEBRIDEMENT N/A 03/09/2019   Procedure: EXCISIONAL DEBRIDEMENT OF ABDOMINAL WOUND ULCERS;  Surgeon: Virl Cagey, MD;  Location: [facility];  Service: General;  Laterality: N/A;   WRIST FRACTURE SURGERY      Social History:  reports that she has never smoked. She has never used smokeless tobacco. She reports that she does not drink alcohol or use  drugs.  Allergies:  Allergies  Allergen Reactions   Feraheme [Ferumoxytol] Other (See Comments)    Back pain (yelling out with back pain)   Propranolol Swelling    Pt states it may have been leg swelling   Topamax [Topiramate] Other (See Comments)    hallucinations   Latex Itching and Rash    Medications Prior to Admission  Medication Sig Dispense Refill   albuterol (PROVENTIL HFA;VENTOLIN HFA) 108 (90 Base) MCG/ACT inhaler Inhale 2 puffs into the lungs every 6 (six) hours as needed for wheezing or shortness of breath. 1 Inhaler 2   Amino Acids-Protein Hydrolys (FEEDING SUPPLEMENT, PRO-STAT SUGAR FREE 64,) LIQD Take 30 mLs by mouth 3 (three) times daily. 887 mL 0   apixaban (ELIQUIS) 5 MG TABS tablet Take 1 tablet (5 mg total) by mouth 2 (two) times daily. 60 tablet 3   Ascorbic Acid (VITAMIN C) 1000 MG tablet Take 1,000 mg by mouth daily.     bisacodyl (DULCOLAX) 10 MG suppository Place 10 mg rectally as needed for moderate constipation.     calcium carbonate (TUMS EX) 750 MG chewable tablet Chew 1 tablet by mouth 2 (two) times daily between meals.     cefTRIAXone 1 g/10 mLs in sterile water (preservative free) injection Inject 1 g into the vein daily at 6 (six) AM.     diltiazem (CARDIZEM CD) 120 MG 24 hr capsule Take 1 capsule (120 mg total) by mouth daily.     ergocalciferol (VITAMIN D2) 1.25 MG (50000 UT) capsule Take 50,000 Units by mouth once a week. Wednesdays     ferrous sulfate (FERROUSUL) 325 (65 FE) MG tablet Take 1 tablet (325 mg total) by mouth 2 (two) times daily with a meal. 60 tablet 0   fluticasone (FLONASE) 50 MCG/ACT nasal spray Place 2 sprays into both nostrils daily as needed for allergies or rhinitis (congestion).      HYDROcodone-acetaminophen (NORCO) 5-325 MG tablet Take 1 tablet by mouth every 6 (six) hours. 30 tablet 0   insulin aspart (NOVOLOG PENFILL) cartridge Inject 6 Units into the skin 3 (three) times daily with meals.     Insulin  Glargine (LANTUS SOLOSTAR) 100 UNIT/ML Solostar Pen Inject 10 Units into the skin at bedtime.     levalbuterol (XOPENEX) 0.63 MG/3ML nebulizer solution Take 0.63 mg by nebulization every 6 (six) hours as needed for wheezing or shortness of breath.      metoprolol tartrate (LOPRESSOR) 50 MG tablet Take 50 mg by mouth 2 (two) times daily.      ondansetron (ZOFRAN) 4 MG tablet Take 4 mg by mouth 3 (three) times daily before meals.      pantoprazole (PROTONIX) 40 MG tablet Take 1 tablet (40 mg total) by mouth daily before breakfast.     promethazine (PHENERGAN) 25 MG/ML injection Inject 25 mg into the vein stat.  sodium polystyrene (KAYEXALATE) 15 GM/60ML suspension Take 30 g by mouth once.     SPIRIVA RESPIMAT 1.25 MCG/ACT AERS Inhale 1 puff into the lungs daily.      zinc gluconate 50 MG tablet Take 50 mg by mouth daily.     nutrition supplement, JUVEN, (JUVEN) PACK Take 1 packet by mouth 2 (two) times daily between meals. (Patient not taking: Reported on 06/08/2019)  0    Blood pressure (!) 95/56, pulse 88, temperature 99.5 F (37.5 C), temperature source Rectal, resp. rate 16, height 5' 4"  (1.626 m), weight 78 kg, SpO2 95 %.  Physical Exam Vitals signs and nursing note reviewed.  Constitutional:      General: She is not in acute distress.    Appearance: She is well-developed. She is morbidly obese. She is not toxic-appearing or diaphoretic.  HENT:     Head: Normocephalic and atraumatic.     Nose: Nose normal.     Mouth/Throat:     Comments: Pt wearing mask Eyes:     Conjunctiva/sclera: Conjunctivae normal.     Pupils: Pupils are equal, round, and reactive to light.  Neck:     Musculoskeletal: Full passive range of motion without pain and normal range of motion.  Pulmonary:     Effort: Pulmonary effort is normal. No respiratory distress.  Abdominal:     Palpations: Abdomen is soft.     Tenderness: There is abdominal tenderness (site of wound).       Comments: RLQ  abdominal wound with small amt of eschar, no foul odor and no purulent drainage. See photos below  Musculoskeletal: Normal range of motion.  Skin:    General: Skin is warm and dry.  Neurological:     Mental Status: She is alert. She is confused.     Coordination: Coordination normal.  Psychiatric:        Behavior: Behavior normal.    pre debridement    Post debridement      Results for orders placed or performed during the hospital encounter of 06/08/19 (from the past 48 hour(s))  CBG monitoring, ED     Status: Abnormal   Collection Time: 06/08/19  6:31 PM  Result Value Ref Range   Glucose-Capillary 56 (L) 70 - 99 mg/dL  Urinalysis, Routine w reflex microscopic     Status: Abnormal   Collection Time: 06/08/19  6:39 PM  Result Value Ref Range   Color, Urine YELLOW YELLOW   APPearance CLOUDY (A) CLEAR   Specific Gravity, Urine 1.015 1.005 - 1.030   pH 5.0 5.0 - 8.0   Glucose, UA NEGATIVE NEGATIVE mg/dL   Hgb urine dipstick SMALL (A) NEGATIVE   Bilirubin Urine NEGATIVE NEGATIVE   Ketones, ur NEGATIVE NEGATIVE mg/dL   Protein, ur NEGATIVE NEGATIVE mg/dL   Nitrite POSITIVE (A) NEGATIVE   Leukocytes,Ua LARGE (A) NEGATIVE   RBC / HPF 21-50 0 - 5 RBC/hpf   WBC, UA >50 (H) 0 - 5 WBC/hpf   Bacteria, UA MANY (A) NONE SEEN   Squamous Epithelial / LPF 0-5 0 - 5    Comment: Performed at [facility], [address] [place], [place] [address]  CBC with Differential     Status: Abnormal   Collection Time: 06/08/19  6:50 PM  Result Value Ref Range   WBC 29.6 (H) 4.0 - 10.5 K/uL   RBC 3.63 (L) 3.87 - 5.11 MIL/uL   Hemoglobin 9.4 (L) 12.0 - 15.0 g/dL   HCT 31.6 (L) 36.0 - 46.0 %  MCV 87.1 80.0 - 100.0 fL   MCH 25.9 (L) 26.0 - 34.0 pg   MCHC 29.7 (L) 30.0 - 36.0 g/dL   RDW 18.3 (H) 11.5 - 15.5 %   Platelets 521 (H) 150 - 400 K/uL   nRBC 0.0 0.0 - 0.2 %   Neutrophils Relative % 93 %   Neutro Abs 27.2 (H) 1.7 - 7.7 K/uL   Lymphocytes Relative 3 %   Lymphs Abs 1.0 0.7 - 4.0 K/uL     Monocytes Relative 3 %   Monocytes Absolute 0.9 0.1 - 1.0 K/uL   Eosinophils Relative 0 %   Eosinophils Absolute 0.0 0.0 - 0.5 K/uL   Basophils Relative 0 %   Basophils Absolute 0.1 0.0 - 0.1 K/uL   WBC Morphology WHITE CELL COUNT CONFIRMED BY SMEAR    Immature Granulocytes 1 %   Abs Immature Granulocytes 0.38 (H) 0.00 - 0.07 K/uL    Comment: Performed at [facility], [address] [place], [place] [address]  Comprehensive metabolic panel     Status: Abnormal   Collection Time: 06/08/19  6:50 PM  Result Value Ref Range   Sodium 132 (L) 135 - 145 mmol/L   Potassium 3.6 3.5 - 5.1 mmol/L   Chloride 107 98 - 111 mmol/L   CO2 18 (L) 22 - 32 mmol/L   Glucose, Bld 61 (L) 70 - 99 mg/dL   BUN 56 (H) 8 - 23 mg/dL   Creatinine, Ser 1.67 (H) 0.44 - 1.00 mg/dL   Calcium 7.2 (L) 8.9 - 10.3 mg/dL   Total Protein 5.1 (L) 6.5 - 8.1 g/dL   Albumin 1.7 (L) 3.5 - 5.0 g/dL   AST 18 15 - 41 U/L   ALT 17 0 - 44 U/L   Alkaline Phosphatase 131 (H) 38 - 126 U/L   Total Bilirubin 0.3 0.3 - 1.2 mg/dL   GFR calc non Af Amer 31 (L) >60 mL/min   GFR calc Af Amer 36 (L) >60 mL/min   Anion gap 7 5 - 15    Comment: Performed at [facility], [address], [place], [place] [address]  Ammonia     Status: None   Collection Time: 06/08/19  6:50 PM  Result Value Ref Range   Ammonia 29 9 - 35 umol/L    Comment: Performed at [facility], [address] [place], [place] [address]  Lactic acid, plasma     Status: None   Collection Time: 06/08/19  6:50 PM  Result Value Ref Range   Lactic Acid, Venous 1.2 0.5 - 1.9 mmol/L    Comment: Performed at [facility], [address], [place], [place] [address]  Blood culture (routine x 2)     Status: None (Preliminary result)   Collection Time: 06/08/19  6:50 PM   Specimen: Artery-; Blood  Result Value Ref Range   Specimen Description BLOOD PICC LINE    Special Requests      Blood Culture adequate volume BOTTLES DRAWN AEROBIC AND ANAEROBIC   Culture      NO GROWTH  < 12 HOURS Performed at [facility], [address] [place], [place] [address]    Report Status PENDING   APTT     Status: Abnormal   Collection Time: 06/08/19  6:50 PM  Result Value Ref Range   aPTT 49 (H) 24 - 36 seconds    Comment:        IF BASELINE aPTT IS ELEVATED, SUGGEST PATIENT RISK ASSESSMENT BE USED TO  DETERMINE APPROPRIATE ANTICOAGULANT THERAPY. Performed at [facility], [address] [name] [address] [place], [place] [address]   Protime-INR     Status: Abnormal   Collection Time: 06/08/19  6:50 PM  Result Value Ref Range   Prothrombin Time 24.0 (H) 11.4 - 15.2 seconds   INR 2.2 (H) 0.8 - 1.2    Comment: (NOTE) INR goal varies based on device and disease states. Performed at [facility], [address], [place], [place] [address]   Ferritin     Status: None   Collection Time: 06/08/19  6:50 PM  Result Value Ref Range   Ferritin 181 11 - 307 ng/mL    Comment: Performed at [facility], [address] [place], [place] [address]  C-reactive protein     Status: Abnormal   Collection Time: 06/08/19  6:50 PM  Result Value Ref Range   CRP 11.7 (H) <1.0 mg/dL    Comment: Performed at [facility], [address] [place], [place] [address]  Blood culture (routine x 2)     Status: None (Preliminary result)   Collection Time: 06/08/19  7:02 PM   Specimen: Artery-; Blood  Result Value Ref Range   Specimen Description BLOOD PICC LINE    Special Requests Blood Culture adequate volume    Culture      NO GROWTH < 12 HOURS Performed at [facility], [address], [place], [place] [address]    Report Status PENDING   CBG monitoring, ED     Status: Abnormal   Collection Time: 06/08/19  7:47 PM  Result Value Ref Range   Glucose-Capillary 41 (LL) 70 - 99 mg/dL  POC CBG, ED     Status: Abnormal   Collection Time: 06/08/19  8:26 PM  Result Value Ref Range   Glucose-Capillary 121 (H) 70 - 99 mg/dL  CBG monitoring, ED     Status: Abnormal   Collection Time: 06/08/19 10:10 PM  Result  Value Ref Range   Glucose-Capillary 69 (L) 70 - 99 mg/dL  CBG monitoring, ED     Status: Abnormal   Collection Time: 06/08/19 10:29 PM  Result Value Ref Range   Glucose-Capillary 119 (H) 70 - 99 mg/dL  CBG monitoring, ED     Status: Abnormal   Collection Time: 06/08/19 11:50 PM  Result Value Ref Range   Glucose-Capillary 116 (H) 70 - 99 mg/dL  Fibrinogen     Status: None   Collection Time: 06/09/19 12:12 AM  Result Value Ref Range   Fibrinogen 347 210 - 475 mg/dL    Comment: Performed at [facility], [address] [place], [place] [address]  D-dimer, quantitative (not at [facility])     Status: None   Collection Time: 06/09/19 12:12 AM  Result Value Ref Range   D-Dimer, Quant <0.27 0.00 - 0.50 ug/mL-FEU    Comment: (NOTE) At the manufacturer cut-off of 0.50 ug/mL FEU, this assay has been documented to exclude PE with a sensitivity and negative predictive value of 97 to 99%.  At this time, this assay has not been approved by the FDA to exclude DVT/VTE. Results should be correlated with clinical presentation. Performed at [facility], [address] [place], [place] [address]   Lactate dehydrogenase     Status: Abnormal   Collection Time: 06/09/19 12:12 AM  Result Value Ref Range   LDH 211 (H) 98 - 192 U/L    Comment: Performed at [facility], [address], [place],  [address]  Troponin I (High Sensitivity)  Status: Abnormal   Collection Time: 06/09/19 12:12 AM  Result Value Ref Range   Troponin I (High Sensitivity) 24 (H) <18 ng/L    Comment: (NOTE) Elevated high sensitivity troponin I (hsTnI) values and significant  changes across serial measurements may suggest ACS but many other  chronic and acute conditions are known to elevate hsTnI results.  Refer to the "Links" section for chest pain algorithms and additional  guidance. Performed at [facility], [address], [place], [place] [address]   CBG monitoring, ED     Status: Abnormal   Collection Time: 06/09/19   2:22 AM  Result Value Ref Range   Glucose-Capillary 105 (H) 70 - 99 mg/dL  CBG monitoring, ED     Status: Abnormal   Collection Time: 06/09/19  3:31 AM  Result Value Ref Range   Glucose-Capillary 104 (H) 70 - 99 mg/dL  Glucose, capillary     Status: None   Collection Time: 06/09/19  4:37 AM  Result Value Ref Range   Glucose-Capillary 88 70 - 99 mg/dL  ABO/Rh     Status: None   Collection Time: 06/09/19  5:00 AM  Result Value Ref Range   ABO/RH(D)      A POS Performed at [facility] [address] [place], [place] [address]   Ferritin     Status: None   Collection Time: 06/09/19  5:00 AM  Result Value Ref Range   Ferritin 175 11 - 307 ng/mL    Comment: Performed at [facility], [place] [address], [place], [place] [address]  D-dimer, quantitative (not at [facility])     Status: None   Collection Time: 06/09/19  5:00 AM  Result Value Ref Range   D-Dimer, Quant 0.32 0.00 - 0.50 ug/mL-FEU    Comment: (NOTE) At the manufacturer cut-off of 0.50 ug/mL FEU, this assay has been documented to exclude PE with a sensitivity and negative predictive value of 97 to 99%.  At this time, this assay has not been approved by the FDA to exclude DVT/VTE. Results should be correlated with clinical presentation. Performed at [facility], [place] [address] [place], [place] [address]   C-reactive protein     Status: Abnormal   Collection Time: 06/09/19  5:00 AM  Result Value Ref Range   CRP 11.5 (H) <1.0 mg/dL    Comment: Performed at [facility], [place] [address], [place], [place] [address]  Basic metabolic panel     Status: Abnormal   Collection Time: 06/09/19  5:00 AM  Result Value Ref Range   Sodium 139 135 - 145 mmol/L   Potassium 3.3 (L) 3.5 - 5.1 mmol/L   Chloride 115 (H) 98 - 111 mmol/L   CO2 17 (L) 22 - 32 mmol/L   Glucose, Bld 84 70 - 99 mg/dL   BUN 46 (H) 8 - 23 mg/dL   Creatinine, Ser 1.32 (H) 0.44 - 1.00 mg/dL   Calcium 6.6 (L) 8.9 - 10.3 mg/dL   GFR calc non Af  Amer 41 (L) >60 mL/min   GFR calc Af Amer 48 (L) >60 mL/min   Anion gap 7 5 - 15    Comment: Performed at [facility] [address] [place],  [address]   Ct Abdomen Pelvis Wo Contrast  Result Date: 06/09/2019 CLINICAL DATA:  Chronic draining abdominal wound, pyoderma gangrenosum, leukocytosis 29, altered mental status, sepsis. EXAM: CT ABDOMEN AND PELVIS WITHOUT CONTRAST TECHNIQUE: Multidetector CT imaging  of the abdomen and pelvis was performed following the standard protocol without IV contrast. COMPARISON:  Contrast-enhanced CT 04/22/2019, additional priors. FINDINGS: Lower chest: Clustered nodular ground-glass opacities in the right lower lobe with associated bronchial thickening. Findings are new from prior exam. Mild left lower lobe bronchial thickening. Cardiomegaly with coronary artery calcifications. Mitral annulus calcifications. Hepatobiliary: Stable biliary dilatation postcholecystectomy. No evidence of focal hepatic abnormality on noncontrast exam. Pancreas: Parenchymal atrophy. No ductal dilatation or inflammation. Spleen: Calcified granuloma. Normal in size. Adrenals/Urinary Tract: No adrenal nodule. Mild bilateral renal parenchymal thinning. No hydronephrosis. Mild bilateral renal cortical scarring. Small hypodense lesion in the left kidney on prior exam not as well-defined in the absence of IV contrast. Partially distended urinary bladder. Mild wall thickening inferiorly. Stomach/Bowel: Bowel evaluation limited in the absence of enteric contrast. Small hiatal hernia. Small duodenal diverticulum. No bowel obstruction or inflammatory change. Appendix not confidently visualized. Mild distal colonic diverticulosis without diverticulitis. Vascular/Lymphatic: Advanced aortic and branch atherosclerosis. No aortic aneurysm. No enlarged lymph nodes in the abdomen or pelvis. Reproductive: Uterus is surgically absent. Right adnexal ovarian cyst measures 4.2 cm, not significantly  changed allowing for differences in caliper placement. Other: Debridement of the right lower quadrant subcutaneous soft tissues. Previous fluid collection in the subcutaneous fat is no longer seen. No residual or recurrent fluid collection. No significant phlegm a tori change. No tracking soft tissue gas to suggest acute infection. No free fluid or free air in the abdomen. Small fat containing umbilical hernia. Musculoskeletal: There are no acute or suspicious osseous abnormalities. Bones are under mineralized. Mild diffuse fatty atrophy of included musculature. IMPRESSION: 1. Resolved fluid collection in the subcutaneous tissues of the lower abdominal wall. Residual skin irregularity but no soft tissue thickening or inflammatory change. No recurrent or new abscess. 2. Clustered nodular ground-glass opacities in the right lower lobe with associated bronchial thickening, suspicious for aspiration. Or pneumonia also considered. 3. Mild bladder wall thickening inferiorly, can be seen with urinary tract infection. Recommend correlation with urinalysis. 4. Colonic diverticulosis without diverticulitis. Aortic Atherosclerosis (ICD10-I70.0). Electronically Signed   By: Keith Rake M.D.   On: 06/09/2019 00:09   Dg Chest Portable 1 View  Result Date: 06/08/2019 CLINICAL DATA:  Altered mental status. COVID. EXAM: PORTABLE CHEST 1 VIEW COMPARISON:  Chest x-ray dated 05/15/2019 FINDINGS: The heart size and pulmonary vascularity are normal the lungs are clear. No effusions. No acute bone abnormality. Aortic atherosclerosis. IMPRESSION: 1. No active cardiopulmonary disease. 2. Aortic atherosclerosis. Electronically Signed   By: Lorriane Shire M.D.   On: 06/08/2019 19:43      Assessment/Plan Principal Problem:   Acute encephalopathy  COVID +  Chronic abdominal wound  Pyoderma gangrenosum? - bedside debridement, no indication for OR debridement  - wet to dry dressing changes - we will sign off at this time.  Please page [place] with any further needs for this patient.    [facility] 06/09/2019, 10:51 AM Please see amion for pager for the following: Cristine Polio, & Friday 7:00am - 4:30pm Thursdays 7:00am -11:30am

## 2019-06-09 NOTE — Progress Notes (Signed)
Patient ID: [name], female   DOB: 09-18-1950, 68 y.o.   MRN: [id number]  PROGRESS NOTE    [name]  QMV:[id number] DOB: 06/20/1951 DOA: 06/08/2019 PCP: [name], MD   Brief Narrative:  68 year old female with history of pyoderma gangrenosum with chronic abdominal wound followed by surgeon Dr. Blake Divine in [place], MRSA infection, chronic diastolic CHF, atrial fibrillation, COPD, CKD stage III, hypertension presented from rehab facility on 06/08/2019 with altered mental status and hypoglycemia.  She tested positive for COVID-19 on 06/07/2019 and has been complaining of mild cough with mild difficulty breathing and sore throat.  She has a chronic draining wound to her lower abdomen from pyoderma gangrenosum.  She had a recent hospitalization from 05/09/2019-05/25/2019 at [facility] for episodes of hypoglycemia and encephalopathy which required neurology evaluation and EEG which showed no evidence of seizures and she was discharged to rehab facility.  Prior to that, she was discharged on 04/30/2023 on IV Invanz for ESBL bacteremia secondary to infected abdominal wound.  She completed 3 weeks of IV Invanz which was discontinued on 05/21/2019.  Prior to that, she has had chronic abdominal wound recently secondary to diagnosis of pyoderma gangrenosum and also was treated with steroids.  On presentation to the ED on 06/08/2019, she was found to be tachycardic with leukocytosis of 29 and elevated creatinine.  Chest x-ray was negative for acute abnormality.  She was started on broad-spectrum antibiotic.  CT of the abdomen and pelvis was ordered.  She was transferred to [facility] because of COVID-19 being positive.  Assessment & Plan:   Metabolic encephalopathy -Most likely secondary to UTI/COVID-19 infection/hypoglycemia -Monitor mental status.  Patient is more awake this morning and responsive but still slow to respond to questions. -Fall  precautions. -During her last hospitalization from 05/09/2019-05/25/2019 at [facility], she had encephalopathy for which neurology had evaluated the patient and she had undergone EEG which was negative for seizures.  COVID-19 infection -Reports mild cough with some difficulty breathing and sore throat -Chest x-ray was negative for acute abnormality. -Currently on room air. -Monitor inflammatory markers: CRP 11.5 today, LDH 211, ferritin 175 -We will hold off on remdesivir and Solu-Medrol for now. -Transfer patient to [facility]  Chronic abdominal wound in a patient with history of pyoderma gangrenosum -CT of the abdomen and pelvis did not show any evidence of abscess or drainable collection -Patient was empirically started on broad-spectrum antibiotics on presentation.  Patient had recent treatment with 3 weeks of IV Invanz for her abdominal wound infection which was discontinued on 05/21/2019.  She follows up with Dr. Mendel Ryder Bridges/surgeon as an outpatient. -General surgery has been consulted.  Wound care as per general surgery recommendations.  Leukocytosis -White count of 29.6 on presentation.  Repeat a.m. white count.  Continue broad-spectrum antibiotics for another 24 hours at least;  follow cultures.  If no evidence of MRSA infection, consider discontinuing IV vancomycin.  Probable UTI  -Urine culture from 2 days ago is growing gram-negative rods.  Follow identification and sensitivities.  Continue meropenem for now  Acute kidney injury on chronic any disease stage III -Creatinine baseline 0.8-1.3. -Creatinine was 2.24 on 06/07/2019.  Creatinine 1.32 today.  Monitor creatinine.  Continue IV fluids.  Diabetes mellitus type 2 with hypoglycemia -Lantus and NovoLog held.  Continue monitoring CBGs.  Chronic diastolic CHF -Compensated.  Last echo in 12/2018 showed EF of 50 to 28% with LV diastolic parameters consistent with pseudonormalization.  Not on diuretics.  Chronic  atrial  fibrillation -Currently rate controlled.  Restart Eliquis as there is no plans for any surgical intervention.  Metoprolol and Cardizem on hold because of low blood pressure.    DVT prophylaxis: Start Eliquis Code Status: Full Family Communication: Spoke with patient at bedside Disposition Plan: Transfer to [facility]  Consultants: General surgery  Procedures: None  Antimicrobials:  Anti-infectives (From admission, onward)   Start     Dose/Rate Route Frequency Ordered Stop   06/10/19 2200  vancomycin (VANCOCIN) 1,500 mg in sodium chloride 0.9 % 500 mL IVPB     1,500 mg 250 mL/hr over 120 Minutes Intravenous Every 48 hours 06/08/19 2007     06/09/19 1000  meropenem (MERREM) 2 g in sodium chloride 0.9 % 100 mL IVPB     2 g 200 mL/hr over 30 Minutes Intravenous Every 12 hours 06/09/19 0855     06/08/19 2100  ertapenem (INVANZ) 1,000 mg in sodium chloride 0.9 % 100 mL IVPB  Status:  Discontinued     1 g 200 mL/hr over 30 Minutes Intravenous Every 24 hours 06/08/19 2007 06/09/19 0855   06/08/19 1945  vancomycin (VANCOCIN) IVPB 1000 mg/200 mL premix  Status:  Discontinued     1,000 mg 200 mL/hr over 60 Minutes Intravenous  Once 06/08/19 1936 06/08/19 1942   06/08/19 1945  ceFEPIme (MAXIPIME) 2 g in sodium chloride 0.9 % 100 mL IVPB  Status:  Discontinued     2 g 200 mL/hr over 30 Minutes Intravenous  Once 06/08/19 1936 06/08/19 2135   06/08/19 1945  vancomycin (VANCOCIN) 1,500 mg in sodium chloride 0.9 % 500 mL IVPB     1,500 mg 250 mL/hr over 120 Minutes Intravenous  Once 06/08/19 1942 06/09/19 0017       Subjective: Patient seen and examined at bedside.  She is more awake this morning but still slow to respond to questions.  Complains of some abdominal pain.  Feels weak.  He is hungry.  No worsening shortness of breath or cough.  Objective: Vitals:   06/08/19 2230 06/08/19 2301 06/09/19 0300 06/09/19 0501  BP: 108/74 108/69 107/60 (!) 95/56  Pulse:    88  Resp: 20 19 16 16   Temp:     99.5 F (37.5 C)  TempSrc:    Rectal  SpO2:    95%  Weight:      Height:        Intake/Output Summary (Last 24 hours) at 06/09/2019 1120 Last data filed at 06/08/2019 2135 Gross per 24 hour  Intake 500 ml  Output --  Net 500 ml   Filed Weights   06/08/19 1838  Weight: 78 kg    Examination:  General exam: Appears calm and comfortable.  Looks chronically ill.  Poor historian. Respiratory system: Bilateral decreased breath sounds at bases with scattered crackles.  No wheezing Cardiovascular system: S1 & S2 heard, Rate controlled Gastrointestinal system: Abdomen is nondistended, soft and mildly tender in the lower abdomen.  Lower abdomen dressing present which was removed and revealed open wound to the lower abdomen with superficial blackish discoloration and some very minimal serosanguineous drainage present.  Normal bowel sounds heard. Extremities: No cyanosis, clubbing; trace edema Central nervous system: Alert and oriented.  Slow to respond to questions.  No focal neurological deficits. Moving extremities Skin: No other rashes, lesions or ulcers Psychiatry: Could not be assessed because of patient being a poor historian..     Data Reviewed: I have personally reviewed following labs and imaging studies  CBC: Recent Labs  Lab 06/07/19 1208 06/08/19 1850  WBC 15.1* 29.6*  NEUTROABS 12.0* 27.2*  HGB 10.6* 9.4*  HCT 37.4 31.6*  MCV 91.4 87.1  PLT 526* 923*   Basic Metabolic Panel: Recent Labs  Lab 06/07/19 1208 06/08/19 0600 06/08/19 1850 06/09/19 0500  NA 131* 132* 132* 139  K 6.5* 4.0 3.6 3.3*  CL 101 105 107 115*  CO2 16* 18* 18* 17*  GLUCOSE 305* 158* 61* 84  BUN 65* 62* 56* 46*  CREATININE 2.24* 2.20* 1.67* 1.32*  CALCIUM 7.9* 7.5* 7.2* 6.6*  MG 1.5* 4.6*  --   --    GFR: Estimated Creatinine Clearance: 41.2 mL/min (A) (by C-G formula based on SCr of 1.32 mg/dL (H)). Liver Function Tests: Recent Labs  Lab 06/07/19 1208 06/08/19 1850  AST 27 18   ALT 14 17  ALKPHOS 132* 131*  BILITOT 0.6 0.3  PROT 5.6* 5.1*  ALBUMIN 2.0* 1.7*   No results for input(s): LIPASE, AMYLASE in the last 168 hours. Recent Labs  Lab 06/08/19 1850  AMMONIA 29   Coagulation Profile: Recent Labs  Lab 06/08/19 1850  INR 2.2*   Cardiac Enzymes: No results for input(s): CKTOTAL, CKMB, CKMBINDEX, TROPONINI in the last 168 hours. BNP (last 3 results) No results for input(s): PROBNP in the last 8760 hours. HbA1C: No results for input(s): HGBA1C in the last 72 hours. CBG: Recent Labs  Lab 06/08/19 2229 06/08/19 2350 06/09/19 0222 06/09/19 0331 06/09/19 0437  GLUCAP 119* 116* 105* 104* 88   Lipid Profile: No results for input(s): CHOL, HDL, LDLCALC, TRIG, CHOLHDL, LDLDIRECT in the last 72 hours. Thyroid Function Tests: No results for input(s): TSH, T4TOTAL, FREET4, T3FREE, THYROIDAB in the last 72 hours. Anemia Panel: Recent Labs    06/08/19 1850 06/09/19 0500  FERRITIN 181 175   Sepsis Labs: Recent Labs  Lab 06/08/19 1850  LATICACIDVEN 1.2    Recent Results (from the past 240 hour(s))  Culture, Urine     Status: Abnormal (Preliminary result)   Collection Time: 06/07/19  1:44 PM   Specimen: Urine, Clean Catch  Result Value Ref Range Status   Specimen Description   Final    URINE, CLEAN CATCH Performed at [facility], [address], [place], [place] [address]    Special Requests   Final    NONE Performed at [facility], [address], [place], [place] [address]    Culture (A)  Final    >=100,000 COLONIES/mL GRAM NEGATIVE RODS IDENTIFICATION AND SUSCEPTIBILITIES TO FOLLOW Performed at [facility], [place] [address], [place], [place] [address]    Report Status PENDING  Incomplete  SARS Coronavirus 2 by RT PCR (hospital order, performed in [place] hospital lab)     Status: Abnormal   Collection Time: 06/08/19  2:10 PM  Result Value Ref Range Status   SARS Coronavirus 2 POSITIVE (A) NEGATIVE Final    Comment:  RESULT CALLED TO, READ BACK BY AND VERIFIED WITH: JONES,V @1532  BY MATTHEWS, B 10.21.20 (NOTE) If result is NEGATIVE SARS-CoV-2 target nucleic acids are NOT DETECTED. The SARS-CoV-2 RNA is generally detectable in upper and lower  respiratory specimens during the acute phase of infection. The lowest  concentration of SARS-CoV-2 viral copies this assay can detect is 250  copies / mL. A negative result does not preclude SARS-CoV-2 infection  and should not be used as the sole basis for treatment or other  patient management decisions.  A negative result may occur  with  improper specimen collection / handling, submission of specimen other  than nasopharyngeal swab, presence of viral mutation(s) within the  areas targeted by this assay, and inadequate number of viral copies  (<250 copies / mL). A negative result must be combined with clinical  observations, patient history, and epidemiological information. If result is POSITIVE SARS-CoV-2 target nucleic acids are DETECTED.  The SARS-CoV-2 RNA is generally detectable in upper and lower  respiratory specimens during the acute phase of infection.  Positive  results are indicative of active infection with SARS-CoV-2.  Clinical  correlation with patient history and other diagnostic information is  necessary to determine patient infection status.  Positive results do  not rule out bacterial infection or co-infection with other viruses. If result is PRESUMPTIVE POSTIVE SARS-CoV-2 nucleic acids MAY BE PRESENT.   A presumptive positive result was obtained on the submitted specimen  and confirmed on repeat testing.  While 2019 novel coronavirus  (SARS-CoV-2) nucleic acids may be present in the submitted sample  additional confirmatory testing may be necessary for epidemiological  and / or clinical management purposes  to differentiate between  SARS-CoV-2 and other Sarbecovirus currently known to infect humans.  If clinically indicated additional  testing with an alternate test  methodology [phone])  is advised. The SARS-CoV-2 RNA is generally  detectable in upper and lower respiratory specimens during the acute  phase of infection. The expected result is Negative. Fact Sheet for Patients:  StrictlyIdeas.no Fact Sheet for Healthcare Providers: BankingDealers.co.za This test is not yet approved or cleared by the [place] FDA and has been authorized for detection and/or diagnosis of SARS-CoV-2 by FDA under an Emergency Use Authorization (EUA).  This EUA will remain in effect (meaning this test can be used) for the duration of the COVID-19 declaration under Section 564(b)(1) of the Act, 21 U.S.C. section 360bbb-3(b)(1), unless the authorization is terminated or revoked sooner. Performed at [facility], [address], [place], [place] [address]   Blood culture (routine x 2)     Status: None (Preliminary result)   Collection Time: 06/08/19  6:50 PM   Specimen: Artery-; Blood  Result Value Ref Range Status   Specimen Description BLOOD PICC LINE  Final   Special Requests   Final    Blood Culture adequate volume BOTTLES DRAWN AEROBIC AND ANAEROBIC   Culture   Final    NO GROWTH < 12 HOURS Performed at [facility], [address] [place], [place] [address]    Report Status PENDING  Incomplete  Blood culture (routine x 2)     Status: None (Preliminary result)   Collection Time: 06/08/19  7:02 PM   Specimen: Artery-; Blood  Result Value Ref Range Status   Specimen Description BLOOD PICC LINE  Final   Special Requests Blood Culture adequate volume  Final   Culture   Final    NO GROWTH < 12 HOURS Performed at [facility], [address], [place], [place] [address]    Report Status PENDING  Incomplete         Radiology Studies: Ct Abdomen Pelvis Wo Contrast  Result Date: 06/09/2019 CLINICAL DATA:  Chronic draining abdominal wound, pyoderma gangrenosum, leukocytosis 29,  altered mental status, sepsis. EXAM: CT ABDOMEN AND PELVIS WITHOUT CONTRAST TECHNIQUE: Multidetector CT imaging of the abdomen and pelvis was performed following the standard protocol without IV contrast. COMPARISON:  Contrast-enhanced CT 04/22/2019, additional priors. FINDINGS: Lower chest: Clustered nodular ground-glass opacities in the right lower lobe with associated bronchial thickening.  Findings are new from prior exam. Mild left lower lobe bronchial thickening. Cardiomegaly with coronary artery calcifications. Mitral annulus calcifications. Hepatobiliary: Stable biliary dilatation postcholecystectomy. No evidence of focal hepatic abnormality on noncontrast exam. Pancreas: Parenchymal atrophy. No ductal dilatation or inflammation. Spleen: Calcified granuloma. Normal in size. Adrenals/Urinary Tract: No adrenal nodule. Mild bilateral renal parenchymal thinning. No hydronephrosis. Mild bilateral renal cortical scarring. Small hypodense lesion in the left kidney on prior exam not as well-defined in the absence of IV contrast. Partially distended urinary bladder. Mild wall thickening inferiorly. Stomach/Bowel: Bowel evaluation limited in the absence of enteric contrast. Small hiatal hernia. Small duodenal diverticulum. No bowel obstruction or inflammatory change. Appendix not confidently visualized. Mild distal colonic diverticulosis without diverticulitis. Vascular/Lymphatic: Advanced aortic and branch atherosclerosis. No aortic aneurysm. No enlarged lymph nodes in the abdomen or pelvis. Reproductive: Uterus is surgically absent. Right adnexal ovarian cyst measures 4.2 cm, not significantly changed allowing for differences in caliper placement. Other: Debridement of the right lower quadrant subcutaneous soft tissues. Previous fluid collection in the subcutaneous fat is no longer seen. No residual or recurrent fluid collection. No significant phlegm a tori change. No tracking soft tissue gas to suggest acute  infection. No free fluid or free air in the abdomen. Small fat containing umbilical hernia. Musculoskeletal: There are no acute or suspicious osseous abnormalities. Bones are under mineralized. Mild diffuse fatty atrophy of included musculature. IMPRESSION: 1. Resolved fluid collection in the subcutaneous tissues of the lower abdominal wall. Residual skin irregularity but no soft tissue thickening or inflammatory change. No recurrent or new abscess. 2. Clustered nodular ground-glass opacities in the right lower lobe with associated bronchial thickening, suspicious for aspiration. Or pneumonia also considered. 3. Mild bladder wall thickening inferiorly, can be seen with urinary tract infection. Recommend correlation with urinalysis. 4. Colonic diverticulosis without diverticulitis. Aortic Atherosclerosis (ICD10-I70.0). Electronically Signed   By: Keith Rake M.D.   On: 06/09/2019 00:09   Dg Chest Portable 1 View  Result Date: 06/08/2019 CLINICAL DATA:  Altered mental status. COVID. EXAM: PORTABLE CHEST 1 VIEW COMPARISON:  Chest x-ray dated 05/15/2019 FINDINGS: The heart size and pulmonary vascularity are normal the lungs are clear. No effusions. No acute bone abnormality. Aortic atherosclerosis. IMPRESSION: 1. No active cardiopulmonary disease. 2. Aortic atherosclerosis. Electronically Signed   By: Lorriane Shire M.D.   On: 06/08/2019 19:43        Scheduled Meds:  Chlorhexidine Gluconate Cloth  6 each Topical Daily   feeding supplement (PRO-STAT SUGAR FREE 64)  30 mL Oral TID   umeclidinium bromide  1 puff Inhalation Daily   vitamin C  1,000 mg Oral Daily   zinc sulfate  220 mg Oral Daily   Continuous Infusions:  sodium chloride 100 mL/hr at 06/09/19 0017   meropenem (MERREM) IV 2 g (06/09/19 1055)   [START ON 06/10/2019] vancomycin            Aline August, MD [facility] 06/09/2019, 11:20 AM

## 2019-06-09 NOTE — Progress Notes (Signed)
Called report to [facility], report given to [facility].

## 2019-06-09 NOTE — Procedures (Signed)
Excisional Bedside Debridement Procedure Note  Pre-operative Diagnosis: chronic abdominal wound   Post-operative Diagnosis: same  Indications: necrotic tissue  Anesthesia: not needed  Procedure Details  The procedure, risks and complications have been discussed in detail including infection and bleeding with the patient. Verbal consent was obtained prior to the procedure. Frequency of surgical debridement.  once Scissors were used to debride the necrotic tissues from the wound.   Wound details/Findinds: Measurement of total devitalized tissue before and after surgical debridement.  12cmx2cm - post debridement scant devitalized tissues remained Amount of devitalized tissue removed from wound.  12cm x 2cm Current wound volume 25cm x 8cm x 4cm.   Presence of infection:  none Presence of non viable tissue.  Yes but removed with debridement Other material in the wound that is expected to inhibit healing.  none  No viable tissue was removed yes.  0cc's blood loss. The patient was observed until stable.  Condition: Tolerated procedure well  Complications: none.   Jackson Latino, [facility]

## 2019-06-10 ENCOUNTER — Inpatient Hospital Stay (HOSPITAL_COMMUNITY): Payer: BC Managed Care – PPO

## 2019-06-10 DIAGNOSIS — G9340 Encephalopathy, unspecified: Secondary | ICD-10-CM

## 2019-06-10 DIAGNOSIS — I6389 Other cerebral infarction: Secondary | ICD-10-CM

## 2019-06-10 DIAGNOSIS — E162 Hypoglycemia, unspecified: Secondary | ICD-10-CM

## 2019-06-10 LAB — TSH: TSH: 0.456 u[IU]/mL (ref 0.350–4.500)

## 2019-06-10 LAB — CBC WITH DIFFERENTIAL/PLATELET
Abs Immature Granulocytes: 0.26 10*3/uL — ABNORMAL HIGH (ref 0.00–0.07)
Basophils Absolute: 0 10*3/uL (ref 0.0–0.1)
Basophils Relative: 0 %
Eosinophils Absolute: 0 10*3/uL (ref 0.0–0.5)
Eosinophils Relative: 0 %
HCT: 24 % — ABNORMAL LOW (ref 36.0–46.0)
Hemoglobin: 7.3 g/dL — ABNORMAL LOW (ref 12.0–15.0)
Immature Granulocytes: 1 %
Lymphocytes Relative: 5 %
Lymphs Abs: 1.2 10*3/uL (ref 0.7–4.0)
MCH: 26.3 pg (ref 26.0–34.0)
MCHC: 30.4 g/dL (ref 30.0–36.0)
MCV: 86.3 fL (ref 80.0–100.0)
Monocytes Absolute: 0.7 10*3/uL (ref 0.1–1.0)
Monocytes Relative: 3 %
Neutro Abs: 20.4 10*3/uL — ABNORMAL HIGH (ref 1.7–7.7)
Neutrophils Relative %: 91 %
Platelets: 329 10*3/uL (ref 150–400)
RBC: 2.78 MIL/uL — ABNORMAL LOW (ref 3.87–5.11)
RDW: 18.6 % — ABNORMAL HIGH (ref 11.5–15.5)
WBC: 22.5 10*3/uL — ABNORMAL HIGH (ref 4.0–10.5)
nRBC: 0 % (ref 0.0–0.2)

## 2019-06-10 LAB — COMPREHENSIVE METABOLIC PANEL
ALT: 18 U/L (ref 0–44)
AST: 22 U/L (ref 15–41)
Albumin: 1.3 g/dL — ABNORMAL LOW (ref 3.5–5.0)
Alkaline Phosphatase: 117 U/L (ref 38–126)
Anion gap: 8 (ref 5–15)
BUN: 39 mg/dL — ABNORMAL HIGH (ref 8–23)
CO2: 17 mmol/L — ABNORMAL LOW (ref 22–32)
Calcium: 7 mg/dL — ABNORMAL LOW (ref 8.9–10.3)
Chloride: 113 mmol/L — ABNORMAL HIGH (ref 98–111)
Creatinine, Ser: 1.05 mg/dL — ABNORMAL HIGH (ref 0.44–1.00)
GFR calc Af Amer: >60 mL/min (ref >60)
GFR calc non Af Amer: 55 mL/min — ABNORMAL LOW (ref >60)
Glucose, Bld: 117 mg/dL — ABNORMAL HIGH (ref 70–99)
Potassium: 3.5 mmol/L (ref 3.5–5.1)
Sodium: 138 mmol/L (ref 135–145)
Total Bilirubin: 0.4 mg/dL (ref 0.3–1.2)
Total Protein: 4 g/dL — ABNORMAL LOW (ref 6.5–8.1)

## 2019-06-10 LAB — GLUCOSE, CAPILLARY
Glucose-Capillary: 121 mg/dL — ABNORMAL HIGH (ref 70–99)
Glucose-Capillary: 138 mg/dL — ABNORMAL HIGH (ref 70–99)
Glucose-Capillary: 110 mg/dL — ABNORMAL HIGH (ref 70–99)
Glucose-Capillary: 110 mg/dL — ABNORMAL HIGH (ref 70–99)

## 2019-06-10 LAB — VITAMIN B12: Vitamin B-12: 1391 pg/mL — ABNORMAL HIGH (ref 180–914)

## 2019-06-10 LAB — FOLATE: Folate: 5.1 ng/mL — ABNORMAL LOW (ref >5.9)

## 2019-06-10 LAB — C-REACTIVE PROTEIN: CRP: 14.5 mg/dL — ABNORMAL HIGH (ref <1.0)

## 2019-06-10 MED ORDER — DILTIAZEM HCL ER COATED BEADS 120 MG PO CP24
120.0000 mg | ORAL_CAPSULE | Freq: Every day | ORAL | Status: DC
  Administered 2019-06-10: 120 mg via ORAL
  Filled 2019-06-10: qty 1

## 2019-06-10 MED ORDER — SODIUM CHLORIDE 0.9 % IV SOLN
1.0000 g | Freq: Three times a day (TID) | INTRAVENOUS | Status: AC
  Administered 2019-06-10 – 2019-06-15 (×16): 1 g via INTRAVENOUS
  Filled 2019-06-10 (×19): qty 1

## 2019-06-10 MED ORDER — METOPROLOL TARTRATE 50 MG PO TABS
50.0000 mg | ORAL_TABLET | Freq: Two times a day (BID) | ORAL | Status: DC
  Administered 2019-06-10 – 2019-06-17 (×14): 50 mg via ORAL
  Filled 2019-06-10 (×15): qty 1

## 2019-06-10 MED ORDER — STROKE: EARLY STAGES OF RECOVERY BOOK
Freq: Once | Status: AC
  Administered 2019-06-10: 11:00:00
  Filled 2019-06-10: qty 1

## 2019-06-10 NOTE — H&P (Signed)
Informed by RN that patient has slurred speech upon her assessment this morning. On my exam, patient does have a slight right facial droop and some dysarthria (pt acknowledges it is new). Time of last seen normal not known, RN staff trying to get in touch with her night RN  On exam Some slight right leg weakness when compared to the left (but patient claims she has issues with the right leg in the past with surgery and arthritis) Her b/l upper extremities are of equal symmetrical strength   Imp: ?CVA vs metabolic encephalopathy  Plan: CT head stat Spoke Dr Chelsea Primus transfer to [facility] will formally consult and provide further recommendations.   Full note to follow later

## 2019-06-10 NOTE — Progress Notes (Signed)
I initially alerted MD to pt's hypotension and variation in mental status from report vs my own assessment around 0730 (see documentation for details). Rapid Response RN alerted to possible code stroke. Both came to bedside to assess the pt.  Vital Signs MEWS/VS Documentation      06/10/2019 0930 06/10/2019 0942 06/10/2019 1000 06/10/2019 1100   MEWS Score:  2  1  2  2    MEWS Score Color:  Yellow  Green  Yellow  Yellow   Resp:  -  18  18  17    Pulse:  -  -  75  -   BP:  -  (!) 84/38  (!) 92/45  (!) 85/45   Temp:  -  98.3 F (36.8 C)  -  -   O2 Device:  -  Room [employer]   Level of Consciousness:  Responds to Voice  -  -  -           Angelia Mould 06/10/2019,1:39 PM

## 2019-06-10 NOTE — Progress Notes (Addendum)
I spoke with pt's husband, Jenny Reichmann, and updated him to the events of the morning. His only question was if I could assist the pt with charging her phone so that he can speak to her directly. I will attempt to do this for them, or make sure he can call her room phone.  Update: pt reports cell phone is broken. I assisted her in dialing her husband and they were able to speak.

## 2019-06-10 NOTE — Progress Notes (Signed)
OT Cancellation    06/10/19 1514  OT Visit Information  Last OT Received On 06/10/19  Reason Eval/Treat Not Completed Patient at procedure or test/ unavailable (having ultrasound)  Maurie Boettcher, OT/L   Acute OT Clinical Specialist [place] Pager [phone] Office [phone]

## 2019-06-10 NOTE — Progress Notes (Signed)
Carotid duplex  has been completed. Refer to [facility] under chart review to view preliminary results.   06/10/2019  4:31 PM Jeffery Bachmeier, [place]ny[place] Fava

## 2019-06-10 NOTE — Progress Notes (Signed)
PT Cancellation Note  Patient Details Name: MARGARETTA CHITTUM MRN: [id number] DOB: 1950/09/20   Cancelled Treatment:    Reason Eval/Treat Not Completed: Patient at procedure or test/unavailable. Will follow-up for PT evaluation as schedule permits.  Mabeline Caras, PT, DPT Acute Rehabilitation Services  Pager [phone] Office [place] 06/10/2019, 3:19 PM

## 2019-06-10 NOTE — Evaluation (Signed)
Physical Therapy Evaluation Patient Details Name: [name] MRN: [id number] DOB: 1951/04/26 Today's Date: 06/10/2019   History of Present Illness  68 y.o. 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 Constance Haw in [place]), A. fib on anticoagulation, CKD stage III, chronic diastolic heart failure-who presented to the hospital with acute metabolic encephalopathy, hypoglycemia and complicated UTI.  Patient was started on broad-spectrum antimicrobial therapy and subsequently admitted to the hospitalist service.  She was also found to have Covid 19+ on 10/21.  Due to concern for abdominal abscesses-a CT of the abdomen was done-this did not show any acute abnormalities.  Patient was subsequently transferred to [facility] on 10/22-as she was found to be COVID-19 positive.    Clinical Impression  Pt presents with an overall decrease in functional mobility secondary to above. Pt poor historian with impaired cognition; PTA, receiving SNF at [facility] but pt reports primarily doing exercises in bed. Today, pt limited by abdominal pain and low BP (83/43); able to perform bed mobility with modA. Currently awaiting transfer to [facility] for stroke work-up. Pt would benefit from continued acute PT services to maximize functional mobility and independence prior to d/c with SNF-level therapies.     Follow Up Recommendations SNF;Supervision/Assistance - 24 hour    Equipment Recommendations  None recommended by PT    Recommendations for Other Services       Precautions / Restrictions Precautions Precautions: Fall;Other (comment) Precaution Comments: Abdominal wound, R thigh wound Restrictions Weight Bearing Restrictions: No      Mobility  Bed Mobility Overal bed mobility: Needs Assistance Bed Mobility: Rolling Rolling: Mod assist         General bed mobility comments: Good automatic  movement to participate with bed mobility, still requires cues for sequencing; limited by abdominal pain  Transfers Overall transfer level: Needs assistance               General transfer comment: not attempted due to pain and low BP  Ambulation/Gait                Stairs            Wheelchair Mobility    Modified Rankin (Stroke Patients Only)       Balance     Sitting balance-Leahy Scale: Fair                                       Pertinent Vitals/Pain Pain Assessment: Faces Faces Pain Scale: Hurts even more Pain Location: abdomen at surgical site Pain Descriptors / Indicators: Sore;Grimacing;Guarding Pain Intervention(s): Monitored during session;Repositioned    Home Living Family/patient expects to be discharged to:: Skilled nursing facility     Type of Home: House                Prior Function Level of Independence: Needs assistance   Gait / Transfers Assistance Needed: Pt reports "I haven't stood in two years" - mostly using w/c with assist from husband, pivot transfers  ADL's / Homemaking Assistance Needed: assisted by spouse  Comments: Does not wear glasses. Reports worsening blurred vision over past month     Hand Dominance   Dominant Hand: Left    Extremity/Trunk Assessment   Upper Extremity Assessment Upper Extremity Assessment: Generalized weakness    Lower Extremity Assessment Lower Extremity Assessment: RLE deficits/detail;Difficult  to assess due to impaired cognition RLE Deficits / Details: possible sensory motor deficits RLE Sensation: decreased light touch RLE Coordination: decreased fine motor    Cervical / Trunk Assessment Cervical / Trunk Assessment: Other exceptions(limited by abdominal pain)  Communication   Communication: Expressive difficulties  Cognition Arousal/Alertness: Awake/alert Behavior During Therapy: Flat affect Overall Cognitive Status: No family/caregiver present to  determine baseline cognitive functioning Area of Impairment: Following commands;Problem solving;Attention;Safety/judgement;Awareness;Orientation                 Orientation Level: Disoriented to;Time Current Attention Level: Sustained Memory: Decreased short-term memory Following Commands: Follows one step commands consistently;Follows one step commands with increased time Safety/Judgement: Decreased awareness of safety;Decreased awareness of deficits Awareness: Emergent Problem Solving: Slow processing;Requires verbal cues;Decreased initiation General Comments: A&Ox3, stating October 1999; oriented to COVID and potential stroke. Pt with worsening attention, processing and problem solving as session continued; likely exacerbated by fatigue. Difficulty 'finding the right word'      General Comments General comments (skin integrity, edema, etc.): Supine BP 83/43, after boost sitting in bed 114/54    Exercises General Exercises - Upper Extremity Shoulder Flexion: Strengthening;Both;5 reps;Seated   Assessment/Plan    PT Assessment Patient needs continued PT services  PT Problem List Decreased strength;Decreased activity tolerance;Decreased balance;Decreased mobility;Decreased cognition;Decreased knowledge of use of DME;Cardiopulmonary status limiting activity       PT Treatment Interventions Balance training;Gait training;Functional mobility training;Therapeutic activities;Therapeutic exercise;Patient/family education    PT Goals (Current goals can be found in the Care Plan section)  Acute Rehab PT Goals Patient Stated Goal: to get better PT Goal Formulation: With patient Time For Goal Achievement: 06/24/19 Potential to Achieve Goals: Fair    Frequency Min 2X/week   Barriers to discharge        Co-evaluation PT/OT/SLP Co-Evaluation/Treatment: Yes Reason for Co-Treatment: Complexity of the patient's impairments (multi-system involvement);For patient/therapist safety PT  goals addressed during session: Mobility/safety with mobility OT goals addressed during session: ADL's and self-care;Strengthening/ROM       AM-PAC PT "6 Clicks" Mobility  Outcome Measure Help needed turning from your back to your side while in a flat bed without using bedrails?: A Lot Help needed moving from lying on your back to sitting on the side of a flat bed without using bedrails?: A Lot Help needed moving to and from a bed to a chair (including a wheelchair)?: Total Help needed standing up from a chair using your arms (e.g., wheelchair or bedside chair)?: Total Help needed to walk in hospital room?: Total Help needed climbing 3-5 steps with a railing? : Total 6 Click Score: 8    End of Session   Activity Tolerance: Patient limited by fatigue Patient left: in bed;with call bell/phone within reach;with bed alarm set;with nursing/sitter in room Nurse Communication: Mobility status;Need for lift equipment PT Visit Diagnosis: Unsteadiness on feet (R26.81);Other abnormalities of gait and mobility (R26.89);Muscle weakness (generalized) (M62.81)    Time: 3734-2876 PT Time Calculation (min) (ACUTE ONLY): 31 min   Charges:   PT Evaluation $PT Eval Moderate Complexity: [place], PT, DPT Acute Rehabilitation Services  Pager [phone] Office [phone]  Derry Lory 06/10/2019, 5:28 PM

## 2019-06-10 NOTE — Evaluation (Signed)
Clinical/Bedside Swallow Evaluation Patient Details  Name: [name] MRN: [id number] Date of Birth: October 01, 1950  Today's Date: 06/10/2019 Time: SLP Start Time (ACUTE ONLY): 1300 SLP Stop Time (ACUTE ONLY): 1315 SLP Time Calculation (min) (ACUTE ONLY): 15 min  Past Medical History:  Past Medical History:  Diagnosis Date  . Atrial fibrillation ([place])   . CHF (congestive heart failure) ([place])   . Depression   . Diabetes mellitus without complication ([place])   . 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 Hypertension   . Morbid obesity ([place])   . Osteoporosis   . Urinary incontinence   . UTI (lower urinary tract infection) 01/2016   Cipro for Klebsiella pneumoniae isolate  . Wheelchair bound    bedbound   Past Surgical History:  Past Surgical History:  Procedure Laterality Date  . ABDOMINAL HYSTERECTOMY    . APPLICATION OF WOUND VAC  03/09/2019   Procedure: APPLICATION OF WOUND VAC;  Surgeon: Virl Cagey, MD;  Location: [facility];  Service: General;;  . BIOPSY  09/06/2018   Procedure: BIOPSY;  Surgeon: Danie Binder, MD;  Location: [facility];  Service: Endoscopy;;  gastric bx's  . CESAREAN SECTION    . CHOLECYSTECTOMY    . ESOPHAGEAL DILATION  09/06/2018   Procedure: ESOPHAGEAL DILATION;  Surgeon: Danie Binder, MD;  Location: [facility];  Service: Endoscopy;;  . ESOPHAGOGASTRODUODENOSCOPY (EGD) WITH PROPOFOL N/A 09/06/2018   Procedure: ESOPHAGOGASTRODUODENOSCOPY (EGD) WITH PROPOFOL;  Surgeon: Danie Binder, MD;  [facility];  Service: Endoscopy;  Laterality: N/A;  dilatation  . FEMUR IM NAIL Left 02/20/2016  . FEMUR IM NAIL Left 02/20/2016   Procedure: INTRAMEDULLARY (IM) RETROGRADE FEMORAL NAILING;  Surgeon: Leandrew Koyanagi, MD;  Location: [place];  Service: Orthopedics;  Laterality: Left;  . PANCREAS SURGERY  1967   1 cyst excised and one cyst drained  . TEE WITHOUT CARDIOVERSION N/A 04/05/2018   Procedure: TRANSESOPHAGEAL  ECHOCARDIOGRAM (TEE);  Surgeon: Dorothy Spark, MD;  Location: [place];  Service: Cardiovascular;  Laterality: N/A;  . [place]    . WOUND DEBRIDEMENT N/A 03/09/2019   Procedure: EXCISIONAL DEBRIDEMENT OF ABDOMINAL WOUND ULCERS;  Surgeon: Virl Cagey, MD;  Location: [facility];  Service: General;  Laterality: N/A;  . WRIST FRACTURE SURGERY     HPI:  68 y.o. 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 Constance Haw in [place]), A. fib on anticoagulation, CKD stage III, chronic diastolic heart failure, esophageal dysphagia s/p dilatation 08/2018-who presented to the ED 10/21 with acute metabolic encephalopathy, hypoglycemia and complicated UTI.  Patient was started on broad-spectrum antimicrobial therapy and subsequently admitted to the hospitalist service.  She was also found to have Covid 19+ on 10/21.  Due to concern for abdominal abscesses-a CT of the abdomen was done-this did not show any acute abnormalities.  Patient was subsequently transferred to [facility] on 10/22-as she was found to be COVID-19 positive.  Change in MS with acute dysarthria morning of 10/23; failed RN stroke swallow screen.    Assessment / Plan / Recommendation Clinical Impression  Pt presents with functional oropharyngeal swallow.  She appears to have mild right central CV VII asymmetry, as well as tongue deviation to the right upon extension.  She is missing most of her teeth, and reports having a recent extraction (September?) with ongoing pain in gums. There  were no overt s/s of aspiration during assessment - pt consumed thin liquids (3 oz) followed by subsequent volumes with no coughing nor c/o difficulty. She ate a container of Magic Cup without deficits.  There was no oral residue post-swallow.  There were no complaints of globus as documented previously.  She declined solids from lunch  tray, c/o ongoing pain since dental work.  She agreed to try a mechanical soft diet as an alternative to regular.  Recommend resuming PO diet - mech soft/dysphagia 3, thin liquids; meds whole in liquid.  No SLP f/u for swallowing is warranted.   SLP Visit Diagnosis: Dysphagia, unspecified (R13.10)    Aspiration Risk       Diet Recommendation   dysphagia 3, thin liquids  Medication Administration: Whole meds with liquid    Other  Recommendations Oral Care Recommendations: Oral care BID   Follow up Recommendations     n/a   Frequency and Duration            Prognosis        Swallow Study   General HPI: 68 y.o. 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 Constance Haw in [place]), A. fib on anticoagulation, CKD stage III, chronic diastolic heart failure-who presented to the ED 10/21 with acute metabolic encephalopathy, hypoglycemia and complicated UTI.  Patient was started on broad-spectrum antimicrobial therapy and subsequently admitted to the hospitalist service.  She was also found to have Covid 19+ on 10/21.  Due to concern for abdominal abscesses-a CT of the abdomen was done-this did not show any acute abnormalities.  Patient was subsequently transferred to [facility] on 10/22-as she was found to be COVID-19 positive.  Change in MS with acute dysarthria morning of 10/23; failed RN stroke swallow screen.  Type of Study: Bedside Swallow Evaluation Previous Swallow Assessment: clinical swallow evaluation at [facility] 08/2018, primarily esophageall dysphagia Diet Prior to this Study: NPO Temperature Spikes Noted: No Respiratory Status: Room air History of Recent Intubation: No Behavior/Cognition: Alert;Cooperative Oral Cavity Assessment: Dry Oral Care Completed by SLP: Yes Oral Cavity - Dentition: Missing dentition Vision: Functional for self-feeding Self-Feeding Abilities:  Able to feed self;Needs assist Patient Positioning: Upright in bed Baseline Vocal Quality: Normal Volitional Cough: Strong Volitional Swallow: Able to elicit    Oral/Motor/Sensory Function Overall Oral Motor/Sensory Function: Mild impairment Facial Symmetry: Abnormal symmetry right;Suspected CN VII (facial) dysfunction Lingual Symmetry: Abnormal symmetry right;Suspected CN XII (hypoglossal) dysfunction   Ice Chips Ice chips: Within functional limits   Thin Liquid Thin Liquid: Within functional limits    Nectar Thick Nectar Thick Liquid: Not tested   Honey Thick Honey Thick Liquid: Not tested   Puree Puree: Within functional limits   Solid     Solid: Within functional limits      Juan Quam Laurice 06/10/2019,3:24 PM  Estill Bamberg L. Tivis Ringer, [place] Office number [phone]

## 2019-06-10 NOTE — Consult Note (Signed)
WOC consulted for wound care orders. Updated based on CCS note [id number].   Gerrard, [place], [place]

## 2019-06-10 NOTE — TOC Initial Note (Signed)
Transition of Care [facility]) - Initial/Assessment Note    Patient Details  Name: [name] MRN: [id number] Date of Birth: 09-Dec-1950  Transition of Care [facility]) CM/SW Contact:    Weston Anna, LCSW Phone Number: 06/10/2019, 2:36 PM  Clinical Narrative:                  Patient from [place] for [place] spoke with spouse, Jenny Reichmann, regarding discharge plans and they would prefer for her to return to [facility] once stable for discharge. CSW spoke with [facility] and they are able to accept patient back once stable and have started insurance authorization. Will continue to follow.   Expected Discharge Plan: Skilled Nursing Facility Barriers to Discharge: Ship broker, Continued Medical Work up   Patient Goals and CMS Choice   CMS Medicare.gov Compare Post Acute Care list provided to:: Patient Represenative (must comment) Choice offered to / list presented to : [facility] POA / Guardian  Expected Discharge Plan and Services Expected Discharge Plan: Skilled Nursing Facility In-house Referral: Clinical Social Work                       DME Arranged: N/A         HH Arranged: NA          Prior Living Arrangements/Services   Lives with:: Significant Other Patient language and need for interpreter reviewed:: Yes Do you feel safe going back to the place where you live?: Yes      Need for Family Participation in Patient Care: Yes (Comment) Care giver support system in place?: Yes (comment)      Activities of Daily Living Home Assistive Devices/Equipment: Eyeglasses ADL Screening (condition at time of admission) Patient's cognitive ability adequate to safely complete daily activities?: Yes Is the patient deaf or have difficulty hearing?: No Does the patient have difficulty seeing, even when wearing glasses/contacts?: No Does the patient have difficulty concentrating, remembering, or making decisions?: Yes Patient able to express need for  assistance with ADLs?: Yes Does the patient have difficulty dressing or bathing?: Yes Independently performs ADLs?: No Communication: Independent Dressing (OT): Dependent Is this a change from baseline?: Pre-admission baseline Grooming: Dependent Is this a change from baseline?: Pre-admission baseline Feeding: Needs assistance Is this a change from baseline?: Pre-admission baseline Bathing: Dependent Is this a change from baseline?: Pre-admission baseline Toileting: Dependent Is this a change from baseline?: Pre-admission baseline In/Out Bed: Dependent Is this a change from baseline?: Pre-admission baseline Does the patient have difficulty walking or climbing stairs?: Yes Weakness of Legs: Both Weakness of Arms/Hands: Both  Permission Sought/Granted                  Emotional Assessment           Psych Involvement: No (comment)  Admission diagnosis:  Hypoglycemia [E16.2] Chronic wound infection of abdomen, sequela [S31.109S, L08.9] Sepsis, due to unspecified organism, unspecified whether acute organ dysfunction present ([place]) [A41.9] COVID-19 virus detected [U07.1] Patient Active Problem List   Diagnosis Date Noted  . Acute delirium 06/03/2019  . Hypertension associated with type 2 diabetes mellitus ([place]) 05/31/2019  . GERD without esophagitis 05/31/2019  . CKD stage 3 due to type 2 diabetes mellitus ([place]) 05/31/2019  . Anemia due to stage 3a chronic kidney disease 05/31/2019  . Protein-calorie malnutrition, severe ([employer]) 05/31/2019  . Acute encephalopathy 05/25/2019  . Atrial fibrillation with tachycardic ventricular rate ([name]) 04/28/2019  . Uncontrolled type 2 diabetes  mellitus with chronic kidney disease, with long-term current use of insulin ([place]) 04/22/2019  . Pyoderma gangrenosum 03/11/2019  . Wound infection 03/09/2019  . Chronic wound infection of abdomen   . Wheelchair bound   . Abdominal wall skin ulcer ([place]) 03/07/2019  . Acute pulmonary edema ([place])  02/17/2019  . Iron deficiency anemia 02/01/2019  . Abdominal wall ulcer, with fat layer exposed ([place]) 01/16/2019  . Elevated troponin   . Nausea and vomiting   . Symptomatic bradycardia 01/10/2019  . DKA (diabetic ketoacidoses) ([place]) 01/10/2019  . Urinary incontinence 12/28/2018  . Recurrent urinary tract infection 12/16/2018  . Wound, open, anterior abdominal wall 12/05/2018  . Hypotension due to hypovolemia   . Gastritis due to nonsteroidal anti-inflammatory drug   . Esophageal dysphagia   . GI bleed 09/03/2018  . Coagulopathy ([place])   . Colitis   . Lower abdominal pain   . Rectal bleeding   . Dehydration 08/16/2018  . Acute on chronic diastolic CHF (congestive heart failure) ([place]) 08/10/2018  . Acute lower UTI 08/10/2018  . Hypokalemia 08/10/2018  . COPD (chronic obstructive pulmonary disease) ([place]) 08/10/2018  . Thyroid lesion 08/10/2018  . Closed fracture of right ankle 06/10/2018  . Closed fracture of left tibia and fibula with routine healing 04/13/18 06/10/2018  . Atrial fibrillation ([place]) 04/15/2018  . Chronic diastolic HF (heart failure) ([place]) 04/15/2018  . CKD (chronic kidney disease), stage III 04/15/2018  . Left tibial fracture 04/14/2018  . CHF exacerbation ([place]) 04/10/2018  . SOB (shortness of breath)   . Acute CHF (congestive heart failure) ([place]) 04/01/2018  . Atrial fibrillation with RVR ([place]) 04/01/2018  . [place] 03/11/2016  . Muscle spasms of lower extremity 03/04/2016  . Acute renal insufficiency 02/26/2016  . History of MRSA infection 02/26/2016  . HTN (hypertension) 02/19/2016  . Fracture, femur, distal ([place]) 02/19/2016  . Fall 02/19/2016  . Depression 02/19/2016  . Femur fracture, left ([place]) 02/19/2016   PCP:  [name], MD Pharmacy:   [place], [place] - [address] [place] [address] Phone: [phone] Fax: [phone]  [id number] [place] [address] [place]: [phone]: [phone] [place] [place] [address]Phone: [phone]: [phone]    Social Determinants of Health ([place] Interventions    Readmission Risk Interventions Readmission Risk Prevention Plan 05/10/2019 04/30/2019 04/29/2019  Transportation Screening Complete Complete Complete  Medication Review (RN Care Manager) Complete Complete Complete  PCP or Specialist appointment within 3-5 days of discharge Complete Complete Not Complete  PCP/Specialist Appt Not Complete comments Going tomorrow after discharge. - -  HRI or Home Care Consult Complete Complete Complete  SW Recovery Care/Counseling Consult Complete Patient refused Complete  Palliative Care Screening - Not Applicable Not Complete  Skilled Nursing Facility Not Complete Not Applicable Not Complete  Some recent data might be hidden

## 2019-06-10 NOTE — Progress Notes (Signed)
Occupational Therapy Evaluation Patient Details Name: [name] MRN: [id number] DOB: 18-Mar-1951 Today's Date: 06/10/2019    History of Present Illness 68 y.o. 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 Constance Haw in [place]), A. fib on anticoagulation, CKD stage III, chronic diastolic heart failure-who presented to the hospital with acute metabolic encephalopathy, hypoglycemia and complicated UTI.  Patient was started on broad-spectrum antimicrobial therapy and subsequently admitted to the hospitalist service.  She was also found to have Covid 19+ on 10/21.  Due to concern for abdominal abscesses-a CT of the abdomen was done-this did not show any acute abnormalities.  Patient was subsequently transferred to [facility] on 10/22-as she was found to be COVID-19 positive.   Clinical Impression   PTA, pt participating in rehab at [facility]. Pt unable to give details of what she has been doing with therapy with the exception of "leg exercises in bed". Pt appears to have cognitive deficits as noted below in addition to possible RUE sensory motor deficits. Pt complains of blurred vision, which she states she has had for "a month". Mobility limited due to abdominal pain and low BP of 83/43 in supine. Anticipate transfer to [facility] for further CVA work up. Pt will need continued rehab at SNF.     Follow Up Recommendations  SNF;Supervision/Assistance - 24 hour    Equipment Recommendations  None recommended by OT    Recommendations for Other Services       Precautions / Restrictions Precautions Precautions: Fall;Other (comment) Precaution Comments: Abdominal wound, R thigh wound Restrictions Weight Bearing Restrictions: No      Mobility Bed Mobility Overal bed mobility: Needs Assistance Bed Mobility: Rolling Rolling: Mod assist            Transfers Overall transfer level:  Needs assistance               General transfer comment: not attempted due to pain and low BP    Balance     Sitting balance-Leahy Scale: Fair                                     ADL either performed or assessed with clinical judgement   ADL Overall ADL's : Needs assistance/impaired     Grooming: Minimal assistance;Bed level   Upper Body Bathing: Moderate assistance   Lower Body Bathing: Maximal assistance;Bed level   Upper Body Dressing : Moderate assistance;Sitting   Lower Body Dressing: Maximal assistance;Bed level               Functional mobility during ADLs: (not attempted this session)       Vision Baseline Vision/History: Wears glasses Wears Glasses: Reading only Patient Visual Report: Blurring of vision Vision Assessment?: Yes Eye Alignment: Within Functional Limits Ocular Range of Motion: Within Functional Limits Alignment/Gaze Preference: Within Defined Limits Tracking/Visual Pursuits: Impaired - to be further tested in functional context Saccades: Additional head turns occurred during testing;Impaired - to be further tested in functional context Convergence: Within functional limits Visual Fields: Impaired-to be further tested in functional context Additional Comments: repoorts changes in visual. Poor visual attention     Perception Perception Comments: will further assess   Praxis      Pertinent Vitals/Pain Pain Assessment: Faces Faces Pain Scale: Hurts even more Pain Location: abdomen at surgical site Pain Descriptors / Indicators: Sore;Grimacing;Guarding  Pain Intervention(s): Limited activity within patient's tolerance;Repositioned     Hand Dominance Left   Extremity/Trunk Assessment Upper Extremity Assessment Upper Extremity Assessment: Generalized weakness   Lower Extremity Assessment Lower Extremity Assessment: RLE deficits/detail RLE Deficits / Details: possible sensory motor deficits RLE Coordination:  decreased fine motor   Cervical / Trunk Assessment Cervical / Trunk Assessment: Other exceptions(limited by abdominal pain)   Communication Communication Communication: Expressive difficulties   Cognition Arousal/Alertness: Awake/alert Behavior During Therapy: Flat affect Overall Cognitive Status: No family/caregiver present to determine baseline cognitive functioning Area of Impairment: Following commands;Problem solving;Attention;Orientation                   Current Attention Level: Sustained Memory: Decreased short-term memory Following Commands: Follows one step commands consistently;Follows one step commands with increased time Safety/Judgement: Decreased awareness of safety;Decreased awareness of deficits Awareness: Emergent Problem Solving: Slow processing;Requires verbal cues General Comments: A&Ox3, stating October 1999; oriented to COVID and potential stroke.   General Comments  supine 83/43; after boost sitting in bed 114/54    Exercises Exercises: General Upper Extremity General Exercises - Upper Extremity Shoulder Flexion: Strengthening;Both;5 reps;Seated   Shoulder Instructions      Home Living Family/patient expects to be discharged to:: Skilled nursing facility     Type of Home: House                              Lives With: Spouse(and grandchild per pt)    Prior Functioning/Environment Level of Independence: Needs assistance  Gait / Transfers Assistance Needed: Pt reports "I haven't stood in two years" - mostly using w/c with assist from husband, pivot transfers ADL's / Homemaking Assistance Needed: assisted by spouse   Comments: Does not wear glasses. Reports worsening blurred vision over past month        OT Problem List: Decreased strength;Decreased activity tolerance;Impaired balance (sitting and/or standing);Decreased coordination;Impaired vision/perception;Decreased cognition;Decreased safety awareness;Decreased knowledge of  use of DME or AE;Impaired sensation;Obesity;Pain;Impaired UE functional use      OT Treatment/Interventions: Self-care/ADL training;Therapeutic exercise;Neuromuscular education;DME and/or AE instruction;Therapeutic activities;Cognitive remediation/compensation;Visual/perceptual remediation/compensation;Patient/family education;Balance training    OT Goals(Current goals can be found in the care plan section) Acute Rehab OT Goals Patient Stated Goal: to get better OT Goal Formulation: With patient Time For Goal Achievement: 06/24/19 Potential to Achieve Goals: Good  OT Frequency: Min 2X/week   Barriers to D/C:            Co-evaluation PT/OT/SLP Co-Evaluation/Treatment: Yes Reason for Co-Treatment: Complexity of the patient's impairments (multi-system involvement);For patient/therapist safety   OT goals addressed during session: ADL's and self-care;Strengthening/ROM      AM-PAC OT "6 Clicks" Daily Activity     Outcome Measure Help from another person eating meals?: A Little Help from another person taking care of personal grooming?: A Little Help from another person toileting, which includes using toliet, bedpan, or urinal?: Total Help from another person bathing (including washing, rinsing, drying)?: A Lot Help from another person to put on and taking off regular upper body clothing?: A Lot Help from another person to put on and taking off regular lower body clothing?: A Lot 6 Click Score: 13   End of Session Nurse Communication: Mobility status;Need for lift equipment([place])  Activity Tolerance: Patient tolerated treatment well Patient left: in bed;with call bell/phone within reach;with bed alarm set;with nursing/sitter in room  OT Visit Diagnosis: Unsteadiness on feet (R26.81);Other abnormalities of gait and mobility (R26.89);Muscle weakness (generalized) (  M62.81);Other symptoms and signs involving cognitive function;Pain Pain - part of body: (abdomen)                 Time: 4129-0475 OT Time Calculation (min): 29 min Charges:  OT General Charges $OT Visit: 1 Visit OT Evaluation $OT Eval Moderate Complexity: [place], OT/L   Acute OT Clinical Specialist Acute Rehabilitation Services Pager [phone] Office [phone]   [facility] 06/10/2019, 5:01 PM

## 2019-06-10 NOTE — Progress Notes (Signed)
Scr has improved to 1.05 w/CrCl ~51. We will adjust merrem to 1g IV q8   Onnie Boer, PharmD, BCIDP, AAHIVP, CPP Infectious Disease Pharmacist 06/10/2019 8:49 AM

## 2019-06-10 NOTE — Progress Notes (Signed)
  Echocardiogram 2D Echocardiogram has been performed.  [name] 06/10/2019, 4:06 PM

## 2019-06-10 NOTE — NC FL2 (Signed)
[place] LEVEL OF CARE SCREENING TOOL     IDENTIFICATION  Patient Name: [name] Birthdate: 1951/01/26 Sex: female Admission Date (Current Location): 06/08/2019  [facility] and [place] Number:  Herbalist and Address:  [facility], [place] [address] [place], [place] [address]      Provider Number: [id number]  Attending Physician Name and Address:  Jonetta Osgood, MD  Relative Name and Phone Number:       Current Level of Care: Hospital Recommended Level of Care: [place] Prior Approval Number:    Date Approved/Denied:   PASRR Number: [id number]  Discharge Plan: SNF    Current Diagnoses: Patient Active Problem List   Diagnosis Date Noted  . Acute delirium 06/03/2019  . Hypertension associated with type 2 diabetes mellitus ([place]) 05/31/2019  . GERD without esophagitis 05/31/2019  . CKD stage 3 due to type 2 diabetes mellitus ([place]) 05/31/2019  . Anemia due to stage 3a chronic kidney disease 05/31/2019  . Protein-calorie malnutrition, severe ([place]) 05/31/2019  . Acute encephalopathy 05/25/2019  . Atrial fibrillation with tachycardic ventricular rate ([place]) 04/28/2019  . Uncontrolled type 2 diabetes mellitus with chronic kidney disease, with long-term current use of insulin ([place]) 04/22/2019  . Pyoderma gangrenosum 03/11/2019  . Wound infection 03/09/2019  . Chronic wound infection of abdomen   . Wheelchair bound   . Abdominal wall skin ulcer ([place]) 03/07/2019  . Acute pulmonary edema ([place]) 02/17/2019  . Iron deficiency anemia 02/01/2019  . Abdominal wall ulcer, with fat layer exposed ([place]) 01/16/2019  . Elevated troponin   . Nausea and vomiting   . Symptomatic bradycardia 01/10/2019  . DKA (diabetic ketoacidoses) ([place]) 01/10/2019  . Urinary incontinence 12/28/2018  . Recurrent urinary tract infection 12/16/2018  . Wound, open, anterior abdominal wall 12/05/2018  . Hypotension due to hypovolemia   .  Gastritis due to nonsteroidal anti-inflammatory drug   . Esophageal dysphagia   . GI bleed 09/03/2018  . Coagulopathy ([place])   . Colitis   . Lower abdominal pain   . Rectal bleeding   . Dehydration 08/16/2018  . Acute on chronic diastolic CHF (congestive heart failure) ([place]) 08/10/2018  . Acute lower UTI 08/10/2018  . Hypokalemia 08/10/2018  . COPD (chronic obstructive pulmonary disease) ([place]) 08/10/2018  . Thyroid lesion 08/10/2018  . Closed fracture of right ankle 06/10/2018  . Closed fracture of left tibia and fibula with routine healing 04/13/18 06/10/2018  . Atrial fibrillation ([place]) 04/15/2018  . Chronic diastolic HF (heart failure) ([place]) 04/15/2018  . CKD (chronic kidney disease), stage III 04/15/2018  . Left tibial fracture 04/14/2018  . CHF exacerbation ([place]) 04/10/2018  . SOB (shortness of breath)   . Acute CHF (congestive heart failure) ([place]) 04/01/2018  . Atrial fibrillation with RVR ([place]) 04/01/2018  . [place] 03/11/2016  . Muscle spasms of lower extremity 03/04/2016  . Acute renal insufficiency 02/26/2016  . History of MRSA infection 02/26/2016  . HTN (hypertension) 02/19/2016  . Fracture, femur, distal ([place]) 02/19/2016  . Fall 02/19/2016  . Depression 02/19/2016  . Femur fracture, left ([place]) 02/19/2016    Orientation RESPIRATION BLADDER Height & Weight     Self  Normal External catheter Weight: 171 lb 15.3 oz (78 kg) Height:  5' 4"  (162.6 cm)  BEHAVIORAL SYMPTOMS/MOOD NEUROLOGICAL BOWEL NUTRITION STATUS        Diet(see dc summary)  AMBULATORY STATUS COMMUNICATION OF NEEDS Skin   Limited Assist   Other (Comment)(Incision lower abdomen,  pressure wound on thigh)                       Personal Care Assistance Level of Assistance  Bathing, Feeding, Dressing Bathing Assistance: Limited assistance Feeding assistance: Limited assistance Dressing Assistance: Limited assistance     Functional Limitations [place]   PT (By licensed PT), OT (By licensed OT)     PT Frequency: 5 OT Frequency: 5            Contractures      Additional Factors Info  Code Status, Allergies Code Status Info: Full Allergies Info: Feraheme (Ferumoxytol) Propranolol Topamax (Topiramate) Latex           Current Medications (06/10/2019):  This is the current hospital active medication list Current Facility-Administered Medications  Medication Dose Route Frequency Provider Last Rate Last Dose  . acetaminophen (TYLENOL) tablet 650 mg  650 mg Oral Q6H PRN Emokpae, Ejiroghene E, MD      . albuterol (VENTOLIN HFA) 108 (90 Base) MCG/ACT inhaler 2 puff  2 puff Inhalation Q6H PRN Emokpae, Ejiroghene E, MD      . apixaban (ELIQUIS) tablet 5 mg  5 mg Oral BID Aline August, MD   Stopped at 06/10/19 6269  . Chlorhexidine Gluconate Cloth 2 % PADS 6 each  6 each Topical Daily Aline August, MD   6 each at 06/09/19 0912  . feeding supplement (PRO-STAT SUGAR FREE 64) liquid 30 mL  30 mL Oral TID Bethena Roys, MD   Stopped at 06/10/19 0933  . fluticasone (FLONASE) 50 MCG/ACT nasal spray 2 spray  2 spray Each Nare Daily PRN Emokpae, Ejiroghene E, MD      . meropenem (MERREM) 1 g in sodium chloride 0.9 % 100 mL IVPB  1 g Intravenous Q8H Ghimire, Shanker M, MD 200 mL/hr at 06/10/19 1128 1 g at 06/10/19 1128  . metoprolol tartrate (LOPRESSOR) tablet 50 mg  50 mg Oral BID Jonetta Osgood, MD   Stopped at 06/10/19 [phone]  . morphine 2 MG/ML injection 1 mg  1 mg Intravenous Q4H PRN Emokpae, Ejiroghene E, MD   1 mg at 06/09/19 2005  . ondansetron (ZOFRAN) tablet 4 mg  4 mg Oral Q6H PRN Emokpae, Ejiroghene E, MD       Or  . ondansetron (ZOFRAN) injection 4 mg  4 mg Intravenous Q6H PRN Emokpae, Ejiroghene E, MD      . polyethylene glycol (MIRALAX / GLYCOLAX) packet 17 g  17 g Oral Daily PRN Emokpae, Ejiroghene E, MD      . umeclidinium bromide (INCRUSE ELLIPTA) 62.5 MCG/INH 1 puff  1 puff Inhalation Daily Emokpae, Ejiroghene E, MD    1 puff at 06/09/19 0911  . vitamin C (ASCORBIC ACID) tablet 1,000 mg  1,000 mg Oral Daily Emokpae, Ejiroghene E, MD   Stopped at 06/10/19 0934  . zinc sulfate capsule 220 mg  220 mg Oral Daily Bethena Roys, MD   Stopped at 06/10/19 0935     Discharge Medications: Please see discharge summary for a list of discharge medications.  Relevant Imaging Results:  Relevant Lab Results:   Additional Information SSN: [id number]  Weston Anna, LCSW

## 2019-06-10 NOTE — Progress Notes (Signed)
PROGRESS NOTE                                                                                                                                                                                                             Patient Demographics:    [name], is a 68 y.o. female, DOB - [birth year], AVW:[id number]  Outpatient Primary MD for the patient is [name], MD   Admit date - 06/08/2019   LOS - 2  Chief Complaint  Patient presents with  . Hypoglycemia       Brief Narrative: Patient is a 68 y.o. 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 Constance Haw in [place]), A. fib on anticoagulation, CKD stage III, chronic diastolic heart failure-who presented to the hospital with acute metabolic encephalopathy, hypoglycemia and complicated UTI.  Patient was started on broad-spectrum antimicrobial therapy and subsequently admitted to the hospitalist service.  She was also found to have Covid 19+ on 10/21.  Due to concern for abdominal abscesses-a CT of the abdomen was done-this did not show any acute abnormalities.  Patient was subsequently transferred to [facility] on 10/22-as she was found to be COVID-19 positive.  Patient also had a hospitalization from 9/21-10/7 at [facility] for hypoglycemia, encephalopathy-during that admission, EEG was negative-neurology evaluation was also completed.   Subjective:    [name] was found to be with some mild dysarthria this morning by RN.  She was noted to have a slight facial droop.  Per RN-patient failed bedside swallow screen x2.   Assessment  & Plan :  Acute metabolic encephalopathy: She is relatively awake and alert this morning-suspect encephalopathy has improved.  Etiology thought to be multifactorial from hypoglycemia, UTI.  ?  Acute CVA: On 10/20 3  AM-noted by RN to be with dysarthria and slight right facial droop.  Not known when she was last seen normal.  She has some slight right leg weakness-but that appears to be chronic-secondary to arthritis/pain/prior surgeries.  Stat CT head without any acute abnormalities.  Case was discussed with neurology on-call-Dr. Lindzen-recommends transfer to [facility] for further work-up.  Have ordered MRI, echo and carotid Doppler.  Is already on Eliquis (was briefly held yesterday for bedside debridement of a chronic abdominal wound) that is  being continued.  Interestingly-upon reevaluation later this morning-her dysarthria seems to be slightly better.  Not sure whether the episode in the morning was secondary to lingering encephalopathy or from acute CVA.  Covid 19 Viral pneumonia: She really does not appear to have any symptoms on my evaluation today-her CRP is up but could be from her chronic abdominal wound.  She denies any cough or shortness of breath to me.  I suspect that this is mostly an incidental finding-however she is at risk of severe disease given her rectal comorbidities.  Repeat chest x-ray on 10/23 continues to show no pneumonia.  I suspect-we can hold off on steroids and remdesivir as she seems to be asymptomatic.  If she starts developing more symptoms or if she becomes hypoxemic-then will have low threshold to start steroids and remdesivir.  Fever: afebrile  O2 requirements: On RA  COVID-19 Labs: Recent Labs    06/08/19 1850 06/09/19 0012 06/09/19 0500 06/10/19 0400  DDIMER  --  <0.27 0.32  --   FERRITIN 181  --  175  --   LDH  --  211*  --   --   CRP 11.7*  --  11.5* 14.5*    Lab Results  Component Value Date   SARSCOV2NAA POSITIVE (A) 06/08/2019   [place] NEGATIVE 05/09/2019   [place] NEGATIVE 04/27/2019   [place] NEGATIVE 04/23/2019     COVID-19 Medications: Steroids:none Remdesivir:none Actemra:none Convalescent Plasma:None Research Studies:N/A   Other medications: Diuretics:Euvolemic-no need for lasix/but on Lasix to maintain negative balance Antibiotics: On broad-spectrum antibiotics for empiric treatment of UTI and suspicion for chronic abdominal wound infection (see below)  Prone/Incentive Spirometry: encouraged incentive spirometry use 3-4/hour.  DVT Prophylaxis  :  Eliquis  Complicated UTI: Continue meropenem-preliminary urine culture growing gram-negative rods-patient has a history of prior ESBL infection.  Remains afebrile-leukocytosis is slowly downtrending.  Chronic abdominal wound-history of pyoderma gangrenosum: CT of the abdomen and pelvis without any evidence of abscess or drainable collection-she has a history of prior wound infection-and required IV Invanz for 3 weeks approximately 1 month back or so.  Appreciate general surgery input-continue with empiric antimicrobial therapy with meropenem-suspect vancomycin can be discontinued on 10/23 as blood cultures are negative.Wound care consultation has been ordered.  AKI on CKD stage III: AKI likely hemodynamically mediated-improving with supportive care.  Creatinine close to usual baseline  DM-2 (A1c 8.8 on 05/09/2019): Hypoglycemic on initial presentation-CBGs are relatively stable.  Continue to hold all insulin products until CBGs persistently elevated in the high 100s range.  Chronic diastolic heart failure: Euvolemic on exam-follow weights/intake output and volume status-no indication for diuretics at this point  Chronic atrial fibrillation: Rate controlled with metoprolol and Cardizem-on Eliquis.  Due to soft blood pressure-stop Cardizem-cautiously continue with metoprolol with parameters.  Anemia: Has anemia secondary to chronic disease-hemoglobin has worsened overnight-but there is no evidence of blood loss.  Patient denies any melena or hematochezia.  This could be from acute illness as well.  Eliquis for now-we will continue to monitor closely.   ABG:     Component Value Date/Time   PHART 7.434 08/17/2018 2030   PCO2ART 44.0 08/17/2018 2030   PO2ART 78.1 (L) 08/17/2018 2030   HCO3 35.2 (H) 05/16/2019 1135   TCO2 29 03/03/2019 1047   ACIDBASEDEF 1.7 01/10/2019 2119   O2SAT 45.5 05/16/2019 1135    Vent Settings: N/A  Condition -Stable  Family Communication  :  Spouse updated over the phone  Code Status :  Full Code  Diet :  Diet Order            Diet NPO time specified  Diet effective now               Disposition Plan  :  Remain hospitalized  Barriers to discharge:  Consults  : Neurology, general surgery  Procedures  : 10/22>> bedside debridement of abdominal wound by general surgery  Antibiotics  :    Anti-infectives (From admission, onward)   Start     Dose/Rate Route Frequency Ordered Stop   06/10/19 2200  vancomycin (VANCOCIN) 1,500 mg in sodium chloride 0.9 % 500 mL IVPB     1,500 mg 250 mL/hr over 120 Minutes Intravenous Every 48 hours 06/08/19 2007     06/10/19 1000  meropenem (MERREM) 1 g in sodium chloride 0.9 % 100 mL IVPB     1 g 200 mL/hr over 30 Minutes Intravenous Every 8 hours 06/10/19 0848     06/09/19 1000  meropenem (MERREM) 2 g in sodium chloride 0.9 % 100 mL IVPB  Status:  Discontinued     2 g 200 mL/hr over 30 Minutes Intravenous Every 12 hours 06/09/19 0855 06/10/19 0848   06/08/19 2100  ertapenem (INVANZ) 1,000 mg in sodium chloride 0.9 % 100 mL IVPB  Status:  Discontinued     1 g 200 mL/hr over 30 Minutes Intravenous Every 24 hours 06/08/19 2007 06/09/19 0855   06/08/19 1945  vancomycin (VANCOCIN) IVPB 1000 mg/200 mL premix  Status:  Discontinued     1,000 mg 200 mL/hr over 60 Minutes Intravenous  Once 06/08/19 1936 06/08/19 1942   06/08/19 1945  ceFEPIme (MAXIPIME) 2 g in sodium chloride 0.9 % 100 mL IVPB  Status:  Discontinued     2 g 200 mL/hr over 30 Minutes Intravenous  Once 06/08/19 1936 06/08/19 2135   06/08/19 1945  vancomycin (VANCOCIN) 1,500 mg in sodium chloride 0.9 % 500 mL  IVPB     1,500 mg 250 mL/hr over 120 Minutes Intravenous  Once 06/08/19 1942 06/09/19 0017      Inpatient Medications  Scheduled Meds: .  stroke: mapping our early stages of recovery book   Does not apply Once  . apixaban  5 mg Oral BID  . Chlorhexidine Gluconate Cloth  6 each Topical Daily  . diltiazem  120 mg Oral Daily  . feeding supplement (PRO-STAT SUGAR FREE 64)  30 mL Oral TID  . metoprolol tartrate  50 mg Oral BID  . umeclidinium bromide  1 puff Inhalation Daily  . vitamin C  1,000 mg Oral Daily  . zinc sulfate  220 mg Oral Daily   Continuous Infusions: . meropenem (MERREM) IV    . vancomycin     PRN Meds:.acetaminophen, albuterol, fluticasone, morphine injection, ondansetron **OR** ondansetron (ZOFRAN) IV, polyethylene glycol   Time Spent in minutes  25  See all Orders from today for further details  Oren Binet M.D on 06/10/2019 at 10:30 AM  To page go to [email or web address] - use universal password  [facility] -  Office  [phone]    Objective:   Vitals:   06/10/19 0347 06/10/19 0400 06/10/19 0805 06/10/19 0942  BP: (!) 91/47 (!) 90/45 (!) 96/42 (!) 84/38  Pulse: 100 79    Resp: 20  18 18   Temp: 99 F (37.2 C)   98.3 F (36.8 C)  TempSrc: Oral   Axillary  SpO2: 93%  100% 97%  Weight:      Height:  Wt Readings from Last 3 Encounters:  06/08/19 78 kg  06/08/19 78.2 kg  06/08/19 78.4 kg     Intake/Output Summary (Last 24 hours) at 06/10/2019 1030 Last data filed at 06/09/2019 1500 Gross per 24 hour  Intake 0 ml  Output -  Net 0 ml     Physical Exam Gen Exam:Alert awake-not in any distress HEENT:atraumatic, normocephalic Chest: B/L clear to auscultation anteriorly CVS:S1S2 regular Abdomen:soft non tender, non distended Extremities:no edema Neurology: See prior notes Skin: no rash   Data Review:    CBC  Recent Labs  Lab 06/07/19 1208 06/08/19 1850 06/10/19 0440  WBC 15.1* 29.6* 22.5*  HGB 10.6* 9.4* 7.3*   HCT 37.4 31.6* 24.0*  PLT 526* 521* 329  MCV 91.4 87.1 86.3  MCH 25.9* 25.9* 26.3  MCHC 28.3* 29.7* 30.4  RDW 18.5* 18.3* 18.6*  LYMPHSABS 1.7 1.0 1.2  MONOABS 0.9 0.9 0.7  EOSABS 0.0 0.0 0.0  BASOSABS 0.1 0.1 0.0    Chemistries  Recent Labs  Lab 06/07/19 1208 06/08/19 0600 06/08/19 1850 06/09/19 0500 06/10/19 0440  NA 131* 132* 132* 139 138  K 6.5* 4.0 3.6 3.3* 3.5  CL 101 105 107 115* 113*  CO2 16* 18* 18* 17* 17*  GLUCOSE 305* 158* 61* 84 117*  BUN 65* 62* 56* 46* 39*  CREATININE 2.24* 2.20* 1.67* 1.32* 1.05*  CALCIUM 7.9* 7.5* 7.2* 6.6* 7.0*  MG 1.5* 4.6*  --   --   --   AST 27  --  18  --  22  ALT 14  --  17  --  18  ALKPHOS 132*  --  131*  --  117  BILITOT 0.6  --  0.3  --  0.4   ------------------------------------------------------------------------------------------------------------------ No results for input(s): CHOL, HDL, LDLCALC, TRIG, CHOLHDL, LDLDIRECT in the last 72 hours.  Lab Results  Component Value Date   HGBA1C 8.8 (H) 05/09/2019   ------------------------------------------------------------------------------------------------------------------ Recent Labs    06/10/19 0440  TSH 0.456   ------------------------------------------------------------------------------------------------------------------ Recent Labs    06/08/19 1850 06/09/19 0500 06/10/19 0400 06/10/19 0440  VITAMINB12  --   --  1,391*  --   FOLATE  --   --   --  5.1*  FERRITIN 181 175  --   --     Coagulation profile Recent Labs  Lab 06/08/19 1850  INR 2.2*    Recent Labs    06/09/19 0012 06/09/19 0500  DDIMER <0.27 0.32    Cardiac Enzymes No results for input(s): CKMB, TROPONINI, MYOGLOBIN in the last 168 hours.  Invalid input(s): CK ------------------------------------------------------------------------------------------------------------------    Component Value Date/Time   BNP 461.0 (H) 05/09/2019 1750    Micro Results Recent Results (from the  past 240 hour(s))  Culture, Urine     Status: Abnormal (Preliminary result)   Collection Time: 06/07/19  1:44 PM   Specimen: Urine, Clean Catch  Result Value Ref Range Status   Specimen Description   Final    URINE, CLEAN CATCH Performed at [facility], [address], [place], [place] [address]    Special Requests   Final    NONE Performed at [facility], [address] [place], [place] [address]    Culture (A)  Final    >=100,000 COLONIES/mL KLEBSIELLA PNEUMONIAE CULTURE REINCUBATED FOR BETTER GROWTH Performed at [facility], [place] [address] [place], [place] [address]    Report Status PENDING  Incomplete  SARS Coronavirus 2 by RT PCR (hospital order, performed in  [facility] hospital lab)     Status: Abnormal   Collection Time: 06/08/19  2:10 PM  Result Value Ref Range Status   SARS Coronavirus 2 POSITIVE (A) NEGATIVE Final    Comment: RESULT CALLED TO, READ BACK BY AND VERIFIED WITH: JONES,V @1532  BY MATTHEWS, B 10.21.20 (NOTE) If result is NEGATIVE SARS-CoV-2 target nucleic acids are NOT DETECTED. The SARS-CoV-2 RNA is generally detectable in upper and lower  respiratory specimens during the acute phase of infection. The lowest  concentration of SARS-CoV-2 viral copies this assay can detect is 250  copies / mL. A negative result does not preclude SARS-CoV-2 infection  and should not be used as the sole basis for treatment or other  patient management decisions.  A negative result may occur with  improper specimen collection / handling, submission of specimen other  than nasopharyngeal swab, presence of viral mutation(s) within the  areas targeted by this assay, and inadequate number of viral copies  (<250 copies / mL). A negative result must be combined with clinical  observations, patient history, and epidemiological information. If result is POSITIVE SARS-CoV-2 target nucleic acids are DETECTED.  The SARS-CoV-2 RNA is generally detectable in upper and lower   respiratory specimens during the acute phase of infection.  Positive  results are indicative of active infection with SARS-CoV-2.  Clinical  correlation with patient history and other diagnostic information is  necessary to determine patient infection status.  Positive results do  not rule out bacterial infection or co-infection with other viruses. If result is PRESUMPTIVE POSTIVE SARS-CoV-2 nucleic acids MAY BE PRESENT.   A presumptive positive result was obtained on the submitted specimen  and confirmed on repeat testing.  While 2019 novel coronavirus  (SARS-CoV-2) nucleic acids may be present in the submitted sample  additional confirmatory testing may be necessary for epidemiological  and / or clinical management purposes  to differentiate between  SARS-CoV-2 and other Sarbecovirus currently known to infect humans.  If clinically indicated additional testing with an alternate test  methodology [phone])  is advised. The SARS-CoV-2 RNA is generally  detectable in upper and lower respiratory specimens during the acute  phase of infection. The expected result is Negative. Fact Sheet for Patients:  StrictlyIdeas.no Fact Sheet for Healthcare Providers: BankingDealers.co.za This test is not yet approved or cleared by the [place] FDA and has been authorized for detection and/or diagnosis of SARS-CoV-2 by FDA under an Emergency Use Authorization (EUA).  This EUA will remain in effect (meaning this test can be used) for the duration of the COVID-19 declaration under Section 564(b)(1) of the Act, 21 U.S.C. section 360bbb-3(b)(1), unless the authorization is terminated or revoked sooner. Performed at [facility], [address] [place], [place] [address]   Blood culture (routine x 2)     Status: None (Preliminary result)   Collection Time: 06/08/19  6:50 PM   Specimen: BLOOD  Result Value Ref Range Status   Specimen Description BLOOD  PICC LINE  Final   Special Requests   Final    Blood Culture adequate volume BOTTLES DRAWN AEROBIC AND ANAEROBIC   Culture   Final    NO GROWTH 2 DAYS Performed at [facility], [address], [place], [place] [address]    Report Status PENDING  Incomplete  Urine culture     Status: Abnormal (Preliminary result)   Collection Time: 06/08/19  6:50 PM   Specimen: Urine, Random  Result Value Ref Range Status   Specimen Description  Final    URINE, RANDOM Performed at [facility], [address], [place], [place] [address]    Special Requests   Final    NONE Performed at [facility], [address], [place], [place] [address]    Culture (A)  Final    70,000 COLONIES/mL GRAM NEGATIVE RODS IDENTIFICATION AND SUSCEPTIBILITIES TO FOLLOW Performed at [facility], [place] [address] [place], [place] [address]    Report Status PENDING  Incomplete  Blood culture (routine x 2)     Status: None (Preliminary result)   Collection Time: 06/08/19  7:02 PM   Specimen: BLOOD  Result Value Ref Range Status   Specimen Description BLOOD PICC LINE  Final   Special Requests Blood Culture adequate volume  Final   Culture   Final    NO GROWTH 2 DAYS Performed at [facility], [address], [place], [place] [address]    Report Status PENDING  Incomplete    Radiology Reports Ct Abdomen Pelvis Wo Contrast  Result Date: 06/09/2019 CLINICAL DATA:  Chronic draining abdominal wound, pyoderma gangrenosum, leukocytosis 29, altered mental status, sepsis. EXAM: CT ABDOMEN AND PELVIS WITHOUT CONTRAST TECHNIQUE: Multidetector CT imaging of the abdomen and pelvis was performed following the standard protocol without IV contrast. COMPARISON:  Contrast-enhanced CT 04/22/2019, additional priors. FINDINGS: Lower chest: Clustered nodular ground-glass opacities in the right lower lobe with associated bronchial thickening. Findings are new from prior exam. Mild left lower lobe bronchial thickening. Cardiomegaly with  coronary artery calcifications. Mitral annulus calcifications. Hepatobiliary: Stable biliary dilatation postcholecystectomy. No evidence of focal hepatic abnormality on noncontrast exam. Pancreas: Parenchymal atrophy. No ductal dilatation or inflammation. Spleen: Calcified granuloma. Normal in size. Adrenals/Urinary Tract: No adrenal nodule. Mild bilateral renal parenchymal thinning. No hydronephrosis. Mild bilateral renal cortical scarring. Small hypodense lesion in the left kidney on prior exam not as well-defined in the absence of IV contrast. Partially distended urinary bladder. Mild wall thickening inferiorly. Stomach/Bowel: Bowel evaluation limited in the absence of enteric contrast. Small hiatal hernia. Small duodenal diverticulum. No bowel obstruction or inflammatory change. Appendix not confidently visualized. Mild distal colonic diverticulosis without diverticulitis. Vascular/Lymphatic: Advanced aortic and branch atherosclerosis. No aortic aneurysm. No enlarged lymph nodes in the abdomen or pelvis. Reproductive: Uterus is surgically absent. Right adnexal ovarian cyst measures 4.2 cm, not significantly changed allowing for differences in caliper placement. Other: Debridement of the right lower quadrant subcutaneous soft tissues. Previous fluid collection in the subcutaneous fat is no longer seen. No residual or recurrent fluid collection. No significant phlegm a tori change. No tracking soft tissue gas to suggest acute infection. No free fluid or free air in the abdomen. Small fat containing umbilical hernia. Musculoskeletal: There are no acute or suspicious osseous abnormalities. Bones are under mineralized. Mild diffuse fatty atrophy of included musculature. IMPRESSION: 1. Resolved fluid collection in the subcutaneous tissues of the lower abdominal wall. Residual skin irregularity but no soft tissue thickening or inflammatory change. No recurrent or new abscess. 2. Clustered nodular ground-glass opacities  in the right lower lobe with associated bronchial thickening, suspicious for aspiration. Or pneumonia also considered. 3. Mild bladder wall thickening inferiorly, can be seen with urinary tract infection. Recommend correlation with urinalysis. 4. Colonic diverticulosis without diverticulitis. Aortic Atherosclerosis (ICD10-I70.0). Electronically Signed   By: Keith Rake M.D.   On: 06/09/2019 00:09   Ct Head Wo Contrast  Result Date: 05/17/2019 CLINICAL DATA:  Altered mental status EXAM: CT HEAD WITHOUT CONTRAST TECHNIQUE: Contiguous axial images were obtained from  the base of the skull through the vertex without intravenous contrast. COMPARISON:  None. FINDINGS: Brain: Examination is significantly limited by motion artifact throughout. No evidence of acute infarction, hemorrhage, hydrocephalus, extra-axial collection or mass lesion/mass effect. Mild periventricular white matter hypodensity. Vascular: No hyperdense vessel or unexpected calcification. Skull: Normal. Negative for fracture or focal lesion. Sinuses/Orbits: No acute finding. Other: None. IMPRESSION: Examination is significantly limited by motion artifact throughout. Within this limitation, no acute intracranial pathology. Small-vessel white matter disease. Electronically Signed   By: Eddie Candle M.D.   On: 05/17/2019 11:48   Dg Chest Portable 1 View  Result Date: 06/08/2019 CLINICAL DATA:  Altered mental status. COVID. EXAM: PORTABLE CHEST 1 VIEW COMPARISON:  Chest x-ray dated 05/15/2019 FINDINGS: The heart size and pulmonary vascularity are normal the lungs are clear. No effusions. No acute bone abnormality. Aortic atherosclerosis. IMPRESSION: 1. No active cardiopulmonary disease. 2. Aortic atherosclerosis. Electronically Signed   By: Lorriane Shire M.D.   On: 06/08/2019 19:43   Dg Chest Port 1 View  Addendum Date: 05/15/2019   ADDENDUM REPORT: 05/15/2019 11:10 ADDENDUM: The technologist reversed the image and incorrectly labeled the  laterality on the image obtained at 1025 hours. A repeat x-ray was obtained at 1051 hours and appropriately labeled. With this new information, it is clear that the patient has a left PICC line, not a right PICC line. The left PICC line tip overlies the right cardiomediastinal silhouette, superimposed on the region of the distal SVC near the junction with the RA. Cardiopericardial silhouette is stable in size. Pulmonary edema pattern again noted. Bones are diffusely demineralized with chronic posttraumatic deformity in the proximal left humerus. IMPRESSION: Left PICC line tip is superimposed on the expected location of the distal SVC. Electronically Signed   By: Misty Stanley M.D.   On: 05/15/2019 11:10   Result Date: 05/15/2019 CLINICAL DATA:  PICC line placement EXAM: PORTABLE CHEST 1 VIEW COMPARISON:  05/14/2019 FINDINGS: 1025 hours. Patient is markedly rotated to the left. The left PICC line seen previously is no longer evident and a right PICC line is now visible. The tip of the right PICC line projects over the left cardiomediastinal shadow on this exam. Given the marked rotation, tip position cannot be localized. Cardiopericardial silhouette is enlarged. Similar pulmonary edema pattern. No substantial pleural effusion. IMPRESSION: Right PICC line is new in the interval. Tip position cannot be adequately assessed given the marked leftward rotation. The tip of the PICC line projects too far to the left but given the presumed anterior positioning of the line, this may simply be related to the rotation. Repeat chest x-ray recommended with careful attention to AP positioning to better assess PICC line location. Electronically Signed: By: Misty Stanley M.D. On: 05/15/2019 10:49   Dg Chest Port 1 View  Result Date: 05/14/2019 CLINICAL DATA:  Evaluate PICC line EXAM: PORTABLE CHEST 1 VIEW COMPARISON:  May 10, 2019 FINDINGS: A left PICC line is been placed in the interval. It is difficult to see the distal  if the believe it terminates in the right atrium 4.5 cm below the caval atrial junction. Stable cardiomediastinal silhouette. No pneumothorax. Mild interstitial prominence centrally. IMPRESSION: 1. The left PICC line appears to terminate in the right atrium, 4.5 cm below the caval atrial junction. Recommend repositioning. 2. Suggested mild pulmonary venous congestion. These results will be called to the ordering clinician or representative by the Radiologist Assistant, and communication documented in the PACS or zVision Dashboard. Electronically Signed  By: Dorise Bullion III M.D   On: 05/14/2019 18:57   Dg Chest Port 1v Same Day  Result Date: 06/10/2019 CLINICAL DATA:  Shortness of breath EXAM: PORTABLE CHEST 1 VIEW COMPARISON:  06/08/2019 FINDINGS: Heart is normal size. No confluent airspace opacities or effusions. No acute bony abnormality. IMPRESSION: No acute cardiopulmonary disease. Electronically Signed   By: Rolm Baptise M.D.   On: 06/10/2019 08:40   Ct Head Code Stroke Wo Contrast  Result Date: 06/10/2019 CLINICAL DATA:  Code stroke. Subacute neuro deficits. Slurred speech EXAM: CT HEAD WITHOUT CONTRAST TECHNIQUE: Contiguous axial images were obtained from the base of the skull through the vertex without intravenous contrast. COMPARISON:  05/17/2019 FINDINGS: Brain: No evidence of acute infarction, hemorrhage, hydrocephalus, extra-axial collection or mass lesion/mass effect. 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 No hyperdense vessel. Skull: Normal. Negative for fracture or focal lesion. Sinuses/Orbits: No acute finding. Other: These results were communicated to [name] at [place] 10/23/2020by text page via the [facility] messaging system. ASPECTS [facility] Stroke Program Early CT Score) - Ganglionic level infarction (caudate, lentiform nuclei, internal capsule, insula, M1-M3 cortex): 7 - Supraganglionic  infarction (M4-M6 cortex): 3 Total score (0-10 with 10 being normal): 10 IMPRESSION: 1. No acute or interval finding. ASPECTS is 10. 2. Atherosclerotic calcification. Electronically Signed   By: Monte Fantasia M.D.   On: 06/10/2019 09:00

## 2019-06-10 NOTE — Consult Note (Signed)
Referring Physician: Dr. Sloan Leiter    Chief Complaint: Confusion  HPI: [name] is an 68 y.o. female with atrial fibnllation, LV thrombus, CHF, DM, HTN and morbid obesity who has been transferred from [facility] to [facility] for stroke/TIA work up. She initially presented on 10/21 the day after testing positive for Covid-19 with cough, sore throat and mild dyspnea. She had recently had a prolonged hospitalization from late September to early October for acute encephalopathy, with the exact etiology not having been identified. She had competed a 3 week course of IV Invanz on 10/3, for chronic abdominal wounds.  This morning RN noted slurred speech and on Hospitalist exam she had slight right facial droop with dysarthria, the latter being confirmed by patient as being new. Also noted was slight right leg weakness relative to the left, which also was weak - these latter findings were old per patient. Her LKN was not known. The main components of the Hospitalist's DDx were CVA and metabolic encephalopathy.   Of note, she is on apixaban 5 mg PO BID.   STAT head CT revealed no acute changes. She was then transferred to [facility] for stroke evaluation.    LSN: Unknown tPA Given: No. LKN unknown.   Past Medical History:  Diagnosis Date  . Atrial fibrillation ([name])   . CHF (congestive heart failure) ([place])   . Depression   . Diabetes mellitus without complication ([place])   . 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 Hypertension   . Morbid obesity ([place])   . Osteoporosis   . Urinary incontinence   . UTI (lower urinary tract infection) 01/2016   Cipro for Klebsiella pneumoniae isolate  . Wheelchair bound    bedbound    Past Surgical History:  Procedure Laterality Date  . ABDOMINAL HYSTERECTOMY    . APPLICATION OF WOUND VAC  03/09/2019   Procedure: APPLICATION OF WOUND VAC;  Surgeon: Virl Cagey, MD;  Location: [facility] ORS;  Service: General;;  . BIOPSY  09/06/2018    Procedure: BIOPSY;  Surgeon: Danie Binder, MD;  Location: [facility] SU[facility];  Service: Endoscopy;;  gastric bx's  . CESAREAN SECTION    . CHOLECYSTECTOMY    . ESOPHAGEAL DILATION  09/06/2018   Procedure: ESOPHAGEAL DILATION;  Surgeon: Danie Binder, MD;  Location: [facility];  Service: Endoscopy;;  . ESOPHAGOGASTRODUODENOSCOPY (EGD) WITH PROPOFOL N/A 09/06/2018   Procedure: ESOPHAGOGASTRODUODENOSCOPY (EGD) WITH PROPOFOL;  Surgeon: Danie Binder, MD;  Location: [facility];  Service: Endoscopy;  Laterality: N/A;  dilatation  . FEMUR IM NAIL Left 02/20/2016  . FEMUR IM NAIL Left 02/20/2016   Procedure: INTRAMEDULLARY (IM) RETROGRADE FEMORAL NAILING;  Surgeon: Leandrew Koyanagi, MD;  Location: [place];  Service: Orthopedics;  Laterality: Left;  . PANCREAS SURGERY  1967   1 cyst excised and one cyst drained  . TEE WITHOUT CARDIOVERSION N/A 04/05/2018   Procedure: TRANSESOPHAGEAL ECHOCARDIOGRAM (TEE);  Surgeon: Dorothy Spark, MD;  Location: [place];  Service: Cardiovascular;  Laterality: N/A;  . [place]    . WOUND DEBRIDEMENT N/A 03/09/2019   Procedure: EXCISIONAL DEBRIDEMENT OF ABDOMINAL WOUND ULCERS;  Surgeon: Virl Cagey, MD;  Location: [facility];  Service: General;  Laterality: N/A;  . WRIST FRACTURE SURGERY      Family History  Problem Relation Age of Onset  . Diabetes Mother        died in her 35's of a stroke  . Cancer  Mother   . Stroke Mother   . Breast cancer Mother   . Diabetes Father        died in his 3's of a stroke.  . Stroke Father   . Diabetes Brother        died @ 37 of a stroke.  . Stroke Brother   . Diabetes Maternal Grandmother   . Diabetes Maternal Grandfather   . Diabetes Paternal Grandmother   . Diabetes Paternal Grandfather   . Breast cancer Maternal Aunt    Social History:  reports that she has never smoked. She has never used smokeless tobacco. She reports that she does not drink alcohol or use drugs.  Allergies:   Allergies  Allergen Reactions  . Feraheme [Ferumoxytol] Other (See Comments)    Back pain (yelling out with back pain)  . Propranolol Swelling    Pt states it may have been leg swelling  . Topamax [Topiramate] Other (See Comments)    hallucinations  . Latex Itching and Rash    Medications:  Scheduled: . apixaban  5 mg Oral BID  . Chlorhexidine Gluconate Cloth  6 each Topical Daily  . feeding supplement (PRO-STAT SUGAR FREE 64)  30 mL Oral TID  . metoprolol tartrate  50 mg Oral BID  . umeclidinium bromide  1 puff Inhalation Daily  . vitamin C  1,000 mg Oral Daily  . zinc sulfate  220 mg Oral Daily   Continuous: . meropenem (MERREM) IV 1 g (06/10/19 1128)    ROS: As per HPI. She does not endorse additional complaints. The patient's confusion precludes obtaining a definitive/reliable ROS.   Physical Examination: Blood pressure (!) 108/54, pulse (!) 108, temperature 98.6 F (37 C), resp. rate (!) 21, height 5' 4"  (1.626 m), weight 78 kg, SpO2 93 %.  HEENT: [place]/AT Lungs: Respirations unlabored Ext: No edema. Atrophy of distal BLE is noted.   Neurologic Examination: Ment: Awake and alert. Some flattening of affect. Some degree of confusion. Some disorientation noted. Perseverated on one occasion. Had difficulty naming 2 out of 5 objects. Speech fluent without dysarthria. Had difficulty with some complex questions. Mildly decreased attention. Non-agitated. Pleasant and cooperative.  CN: Pupils equal. Visual fields intact with no extinction to DSS. EOM are full with saccadic pursuits noted. No nystagmus or forced gaze deviation. FT sensation decreased on the right. Face with mild asymmetry at baseline but contracts equally with smile. HOH. Mild hypophonia. Head is midline. Tongue protrudes midline.  Motor: 5/5 bilateral upper extremities with no drift. BLE are diffusely weak in all muscle groups with 2-3/5 strength and decreased distal muscle bulk bilaterally.  Sensory: Decreased FT  to RUE and LLE. Hyperalgesia to soles of feet.  Reflexes: 2+ bilateral brachioradialis. Deferred patellars due to joint pain. Achilles reflexes hypoactive bilaterally. Toes mute bilaterally.  Cerebellar: No ataxia with FNF bilaterally.  Gait: Unable to assess.   Results for orders placed or performed during the hospital encounter of 06/08/19 (from the past 48 hour(s))  CBG monitoring, ED     Status: Abnormal   Collection Time: 06/08/19  6:31 PM  Result Value Ref Range   Glucose-Capillary 56 (L) 70 - 99 mg/dL  Urinalysis, Routine w reflex microscopic     Status: Abnormal   Collection Time: 06/08/19  6:39 PM  Result Value Ref Range   Color, Urine YELLOW YELLOW   APPearance CLOUDY (A) CLEAR   Specific Gravity, Urine 1.015 1.005 - 1.030   pH 5.0 5.0 - 8.0   Glucose,  UA NEGATIVE NEGATIVE mg/dL   Hgb urine dipstick SMALL (A) NEGATIVE   Bilirubin Urine NEGATIVE NEGATIVE   Ketones, ur NEGATIVE NEGATIVE mg/dL   Protein, ur NEGATIVE NEGATIVE mg/dL   Nitrite POSITIVE (A) NEGATIVE   Leukocytes,Ua LARGE (A) NEGATIVE   RBC / HPF 21-50 0 - 5 RBC/hpf   WBC, UA >50 (H) 0 - 5 WBC/hpf   Bacteria, UA MANY (A) NONE SEEN   Squamous Epithelial / LPF 0-5 0 - 5    Comment: Performed at [facility], [address], [place], [place] [address]  CBC with Differential     Status: Abnormal   Collection Time: 06/08/19  6:50 PM  Result Value Ref Range   WBC 29.6 (H) 4.0 - 10.5 K/uL   RBC 3.63 (L) 3.87 - 5.11 MIL/uL   Hemoglobin 9.4 (L) 12.0 - 15.0 g/dL   HCT 31.6 (L) 36.0 - 46.0 %   MCV 87.1 80.0 - 100.0 fL   MCH 25.9 (L) 26.0 - 34.0 pg   MCHC 29.7 (L) 30.0 - 36.0 g/dL   RDW 18.3 (H) 11.5 - 15.5 %   Platelets 521 (H) 150 - 400 K/uL   nRBC 0.0 0.0 - 0.2 %   Neutrophils Relative % 93 %   Neutro Abs 27.2 (H) 1.7 - 7.7 K/uL   Lymphocytes Relative 3 %   Lymphs Abs 1.0 0.7 - 4.0 K/uL   Monocytes Relative 3 %   Monocytes Absolute 0.9 0.1 - 1.0 K/uL   Eosinophils Relative 0 %   Eosinophils Absolute 0.0 0.0 -  0.5 K/uL   Basophils Relative 0 %   Basophils Absolute 0.1 0.0 - 0.1 K/uL   WBC Morphology WHITE CELL COUNT CONFIRMED BY SMEAR    Immature Granulocytes 1 %   Abs Immature Granulocytes 0.38 (H) 0.00 - 0.07 K/uL    Comment: Performed at [facility], [address] [place], [place] [address]  Comprehensive metabolic panel     Status: Abnormal   Collection Time: 06/08/19  6:50 PM  Result Value Ref Range   Sodium 132 (L) 135 - 145 mmol/L   Potassium 3.6 3.5 - 5.1 mmol/L   Chloride 107 98 - 111 mmol/L   CO2 18 (L) 22 - 32 mmol/L   Glucose, Bld 61 (L) 70 - 99 mg/dL   BUN 56 (H) 8 - 23 mg/dL   Creatinine, Ser 1.67 (H) 0.44 - 1.00 mg/dL   Calcium 7.2 (L) 8.9 - 10.3 mg/dL   Total Protein 5.1 (L) 6.5 - 8.1 g/dL   Albumin 1.7 (L) 3.5 - 5.0 g/dL   AST 18 15 - 41 U/L   ALT 17 0 - 44 U/L   Alkaline Phosphatase 131 (H) 38 - 126 U/L   Total Bilirubin 0.3 0.3 - 1.2 mg/dL   GFR calc non Af Amer 31 (L) >60 mL/min   GFR calc Af Amer 36 (L) >60 mL/min   Anion gap 7 5 - 15    Comment: Performed at [facility], [address] [place], [place] [address]  Ammonia     Status: None   Collection Time: 06/08/19  6:50 PM  Result Value Ref Range   Ammonia 29 9 - 35 umol/L    Comment: Performed at [facility], [address] [place], [place] [address]  Lactic acid, plasma     Status: None   Collection Time: 06/08/19  6:50 PM  Result Value Ref Range   Lactic Acid, Venous 1.2 0.5 - 1.9 mmol/L  Comment: Performed at [facility], [address], [place], [place] [address]  Blood culture (routine x 2)     Status: None (Preliminary result)   Collection Time: 06/08/19  6:50 PM   Specimen: BLOOD  Result Value Ref Range   Specimen Description BLOOD PICC LINE    Special Requests      Blood Culture adequate volume BOTTLES DRAWN AEROBIC AND ANAEROBIC   Culture      NO GROWTH 2 DAYS Performed at [facility], [address], [place], [place] [address]    Report Status PENDING   Urine culture     Status:  Abnormal (Preliminary result)   Collection Time: 06/08/19  6:50 PM   Specimen: Urine, Random  Result Value Ref Range   Specimen Description      URINE, RANDOM Performed at [facility], [address], [place], [place] [address]    Special Requests      NONE Performed at [facility], [address] [place], [place] [address]    Culture (A)     70,000 COLONIES/mL KLEBSIELLA PNEUMONIAE SUSCEPTIBILITIES TO FOLLOW Performed at [facility], [place] [address], [place], [place] [address]    Report Status PENDING   APTT     Status: Abnormal   Collection Time: 06/08/19  6:50 PM  Result Value Ref Range   aPTT 49 (H) 24 - 36 seconds    Comment:        IF BASELINE aPTT IS ELEVATED, SUGGEST PATIENT RISK ASSESSMENT BE USED TO DETERMINE APPROPRIATE ANTICOAGULANT THERAPY. Performed at [facility], [address], [place], [place] [address]   Protime-INR     Status: Abnormal   Collection Time: 06/08/19  6:50 PM  Result Value Ref Range   Prothrombin Time 24.0 (H) 11.4 - 15.2 seconds   INR 2.2 (H) 0.8 - 1.2    Comment: (NOTE) INR goal varies based on device and disease states. Performed at [facility], [address], [place], [place] [address]   Ferritin     Status: None   Collection Time: 06/08/19  6:50 PM  Result Value Ref Range   Ferritin 181 11 - 307 ng/mL    Comment: Performed at [facility], [address], [place], [place] [address]  C-reactive protein     Status: Abnormal   Collection Time: 06/08/19  6:50 PM  Result Value Ref Range   CRP 11.7 (H) <1.0 mg/dL    Comment: Performed at [facility], [address] [place], [place] [address]  Blood culture (routine x 2)     Status: None (Preliminary result)   Collection Time: 06/08/19  7:02 PM   Specimen: BLOOD  Result Value Ref Range   Specimen Description BLOOD PICC LINE    Special Requests Blood Culture adequate volume    Culture      NO GROWTH 2 DAYS Performed at [facility], [address] [place],   [address]    Report Status PENDING   CBG monitoring, ED     Status: Abnormal   Collection Time: 06/08/19  7:47 PM  Result Value Ref Range   Glucose-Capillary 41 (LL) 70 - 99 mg/dL  POC CBG, ED     Status: Abnormal   Collection Time: 06/08/19  8:26 PM  Result Value Ref Range   Glucose-Capillary 121 (H) 70 - 99 mg/dL  CBG monitoring, ED     Status: Abnormal   Collection Time: 06/08/19 10:10 PM  Result Value Ref Range   Glucose-Capillary 69 (  L) 70 - 99 mg/dL  CBG monitoring, ED     Status: Abnormal   Collection Time: 06/08/19 10:29 PM  Result Value Ref Range   Glucose-Capillary 119 (H) 70 - 99 mg/dL  CBG monitoring, ED     Status: Abnormal   Collection Time: 06/08/19 11:50 PM  Result Value Ref Range   Glucose-Capillary 116 (H) 70 - 99 mg/dL  Fibrinogen     Status: None   Collection Time: 06/09/19 12:12 AM  Result Value Ref Range   Fibrinogen 347 210 - 475 mg/dL    Comment: Performed at [facility], [address] [place], [place] [address]  D-dimer, quantitative (not at [facility])     Status: None   Collection Time: 06/09/19 12:12 AM  Result Value Ref Range   D-Dimer, Quant <0.27 0.00 - 0.50 ug/mL-FEU    Comment: (NOTE) At the manufacturer cut-off of 0.50 ug/mL FEU, this assay has been documented to exclude PE with a sensitivity and negative predictive value of 97 to 99%.  At this time, this assay has not been approved by the FDA to exclude DVT/VTE. Results should be correlated with clinical presentation. Performed at [facility], [address] [place], [place] [address]   Lactate dehydrogenase     Status: Abnormal   Collection Time: 06/09/19 12:12 AM  Result Value Ref Range   LDH 211 (H) 98 - 192 U/L    Comment: Performed at [facility], [address], [place], [place] [address]  Troponin I (High Sensitivity)     Status: Abnormal   Collection Time: 06/09/19 12:12 AM  Result Value Ref Range   Troponin I (High Sensitivity) 24 (H) <18 ng/L    Comment: (NOTE) Elevated high  sensitivity troponin I (hsTnI) values and significant  changes across serial measurements may suggest ACS but many other  chronic and acute conditions are known to elevate hsTnI results.  Refer to the "Links" section for chest pain algorithms and additional  guidance. Performed at [facility], [address] [place], [place] [address]   CBG monitoring, ED     Status: Abnormal   Collection Time: 06/09/19  2:22 AM  Result Value Ref Range   Glucose-Capillary 105 (H) 70 - 99 mg/dL  CBG monitoring, ED     Status: Abnormal   Collection Time: 06/09/19  3:31 AM  Result Value Ref Range   Glucose-Capillary 104 (H) 70 - 99 mg/dL  Glucose, capillary     Status: None   Collection Time: 06/09/19  4:37 AM  Result Value Ref Range   Glucose-Capillary 88 70 - 99 mg/dL  ABO/Rh     Status: None   Collection Time: 06/09/19  5:00 AM  Result Value Ref Range   ABO/RH(D)      A POS Performed at [place] [address] [place], [place] [address]   Ferritin     Status: None   Collection Time: 06/09/19  5:00 AM  Result Value Ref Range   Ferritin 175 11 - 307 ng/mL    Comment: Performed at [facility], [place] [address], [place], [place] [address]  D-dimer, quantitative (not at [facility])     Status: None   Collection Time: 06/09/19  5:00 AM  Result Value Ref Range   D-Dimer, Quant 0.32 0.00 - 0.50 ug/mL-FEU    Comment: (NOTE) At the manufacturer cut-off of 0.50 ug/mL FEU, this assay has been documented to exclude PE with a sensitivity and negative predictive value of 97  to 99%.  At this time, this assay has not been approved by the FDA to exclude DVT/VTE. Results should be correlated with clinical presentation. Performed at [facility], [place] [address], [place], [place] [address]   C-reactive protein     Status: Abnormal   Collection Time: 06/09/19  5:00 AM  Result Value Ref Range   CRP 11.5 (H) <1.0 mg/dL    Comment: Performed at [facility], [place] [address]  [place], [place] [address]  Basic metabolic panel     Status: Abnormal   Collection Time: 06/09/19  5:00 AM  Result Value Ref Range   Sodium 139 135 - 145 mmol/L   Potassium 3.3 (L) 3.5 - 5.1 mmol/L   Chloride 115 (H) 98 - 111 mmol/L   CO2 17 (L) 22 - 32 mmol/L   Glucose, Bld 84 70 - 99 mg/dL   BUN 46 (H) 8 - 23 mg/dL   Creatinine, Ser 1.32 (H) 0.44 - 1.00 mg/dL   Calcium 6.6 (L) 8.9 - 10.3 mg/dL   GFR calc non Af Amer 41 (L) >60 mL/min   GFR calc Af Amer 48 (L) >60 mL/min   Anion gap 7 5 - 15    Comment: Performed at [facility] [address], [place], [place] [address]  Glucose, capillary     Status: None   Collection Time: 06/09/19 12:01 PM  Result Value Ref Range   Glucose-Capillary 83 70 - 99 mg/dL  Glucose, capillary     Status: Abnormal   Collection Time: 06/09/19  3:52 PM  Result Value Ref Range   Glucose-Capillary 122 (H) 70 - 99 mg/dL  Glucose, capillary     Status: Abnormal   Collection Time: 06/09/19  7:18 PM  Result Value Ref Range   Glucose-Capillary 106 (H) 70 - 99 mg/dL  Glucose, capillary     Status: Abnormal   Collection Time: 06/09/19 11:46 PM  Result Value Ref Range   Glucose-Capillary 138 (H) 70 - 99 mg/dL  Glucose, capillary     Status: Abnormal   Collection Time: 06/10/19  3:46 AM  Result Value Ref Range   Glucose-Capillary 121 (H) 70 - 99 mg/dL  C-reactive protein     Status: Abnormal   Collection Time: 06/10/19  4:00 AM  Result Value Ref Range   CRP 14.5 (H) <1.0 mg/dL    Comment: Performed at [facility], [place] [address], [place], [place] [address]  Vitamin B12     Status: Abnormal   Collection Time: 06/10/19  4:00 AM  Result Value Ref Range   Vitamin B-12 1,391 (H) 180 - 914 pg/mL    Comment: (NOTE) This assay is not validated for testing neonatal or myeloproliferative syndrome specimens for Vitamin B12 levels. Performed at [facility], [place] [address] [place], [place] [address]   Comprehensive  metabolic panel     Status: Abnormal   Collection Time: 06/10/19  4:40 AM  Result Value Ref Range   Sodium 138 135 - 145 mmol/L   Potassium 3.5 3.5 - 5.1 mmol/L   Chloride 113 (H) 98 - 111 mmol/L   CO2 17 (L) 22 - 32 mmol/L   Glucose, Bld 117 (H) 70 - 99 mg/dL   BUN 39 (H) 8 - 23 mg/dL   Creatinine, Ser 1.05 (H) 0.44 - 1.00 mg/dL   Calcium 7.0 (L) 8.9 - 10.3 mg/dL   Total Protein 4.0 (L) 6.5 - 8.1 g/dL  Albumin 1.3 (L) 3.5 - 5.0 g/dL   AST 22 15 - 41 U/L   ALT 18 0 - 44 U/L   Alkaline Phosphatase 117 38 - 126 U/L   Total Bilirubin 0.4 0.3 - 1.2 mg/dL   GFR calc non Af Amer 55 (L) >60 mL/min   GFR calc Af Amer >60 >60 mL/min   Anion gap 8 5 - 15    Comment: Performed at [facility], [place] [address] [place], [place] [address]  CBC with Differential/Platelet     Status: Abnormal   Collection Time: 06/10/19  4:40 AM  Result Value Ref Range   WBC 22.5 (H) 4.0 - 10.5 K/uL   RBC 2.78 (L) 3.87 - 5.11 MIL/uL   Hemoglobin 7.3 (L) 12.0 - 15.0 g/dL   HCT 24.0 (L) 36.0 - 46.0 %   MCV 86.3 80.0 - 100.0 fL   MCH 26.3 26.0 - 34.0 pg   MCHC 30.4 30.0 - 36.0 g/dL   RDW 18.6 (H) 11.5 - 15.5 %   Platelets 329 150 - 400 K/uL   nRBC 0.0 0.0 - 0.2 %   Neutrophils Relative % 91 %   Neutro Abs 20.4 (H) 1.7 - 7.7 K/uL   Lymphocytes Relative 5 %   Lymphs Abs 1.2 0.7 - 4.0 K/uL   Monocytes Relative 3 %   Monocytes Absolute 0.7 0.1 - 1.0 K/uL   Eosinophils Relative 0 %   Eosinophils Absolute 0.0 0.0 - 0.5 K/uL   Basophils Relative 0 %   Basophils Absolute 0.0 0.0 - 0.1 K/uL   Immature Granulocytes 1 %   Abs Immature Granulocytes 0.26 (H) 0.00 - 0.07 K/uL    Comment: Performed at [facility], [place] [address] [place], [place] [address]  Folate     Status: Abnormal   Collection Time: 06/10/19  4:40 AM  Result Value Ref Range   Folate 5.1 (L) >5.9 ng/mL    Comment: Performed at [facility], [place] [address], [place], [place] [address]  TSH      Status: None   Collection Time: 06/10/19  4:40 AM  Result Value Ref Range   TSH 0.456 0.350 - 4.500 uIU/mL    Comment: Performed by a 3rd Generation assay with a functional sensitivity of <=0.01 uIU/mL. Performed at [facility], [place] [address] [place], [place] [address]   Glucose, capillary     Status: Abnormal   Collection Time: 06/10/19  8:22 AM  Result Value Ref Range   Glucose-Capillary 110 (H) 70 - 99 mg/dL  Glucose, capillary     Status: Abnormal   Collection Time: 06/10/19 12:20 PM  Result Value Ref Range   Glucose-Capillary 110 (H) 70 - 99 mg/dL   Ct Abdomen Pelvis Wo Contrast  Result Date: 06/09/2019 CLINICAL DATA:  Chronic draining abdominal wound, pyoderma gangrenosum, leukocytosis 29, altered mental status, sepsis. EXAM: CT ABDOMEN AND PELVIS WITHOUT CONTRAST TECHNIQUE: Multidetector CT imaging of the abdomen and pelvis was performed following the standard protocol without IV contrast. COMPARISON:  Contrast-enhanced CT 04/22/2019, additional priors. FINDINGS: Lower chest: Clustered nodular ground-glass opacities in the right lower lobe with associated bronchial thickening. Findings are new from prior exam. Mild left lower lobe bronchial thickening. Cardiomegaly with coronary artery calcifications. Mitral annulus calcifications. Hepatobiliary: Stable biliary dilatation postcholecystectomy. No evidence of focal hepatic abnormality on noncontrast exam. Pancreas: Parenchymal atrophy. No ductal dilatation or inflammation. Spleen: Calcified granuloma. Normal in size. Adrenals/Urinary Tract: No adrenal nodule. Mild  bilateral renal parenchymal thinning. No hydronephrosis. Mild bilateral renal cortical scarring. Small hypodense lesion in the left kidney on prior exam not as well-defined in the absence of IV contrast. Partially distended urinary bladder. Mild wall thickening inferiorly. Stomach/Bowel: Bowel evaluation limited in the absence of enteric contrast. Small  hiatal hernia. Small duodenal diverticulum. No bowel obstruction or inflammatory change. Appendix not confidently visualized. Mild distal colonic diverticulosis without diverticulitis. Vascular/Lymphatic: Advanced aortic and branch atherosclerosis. No aortic aneurysm. No enlarged lymph nodes in the abdomen or pelvis. Reproductive: Uterus is surgically absent. Right adnexal ovarian cyst measures 4.2 cm, not significantly changed allowing for differences in caliper placement. Other: Debridement of the right lower quadrant subcutaneous soft tissues. Previous fluid collection in the subcutaneous fat is no longer seen. No residual or recurrent fluid collection. No significant phlegm a tori change. No tracking soft tissue gas to suggest acute infection. No free fluid or free air in the abdomen. Small fat containing umbilical hernia. Musculoskeletal: There are no acute or suspicious osseous abnormalities. Bones are under mineralized. Mild diffuse fatty atrophy of included musculature. IMPRESSION: 1. Resolved fluid collection in the subcutaneous tissues of the lower abdominal wall. Residual skin irregularity but no soft tissue thickening or inflammatory change. No recurrent or new abscess. 2. Clustered nodular ground-glass opacities in the right lower lobe with associated bronchial thickening, suspicious for aspiration. Or pneumonia also considered. 3. Mild bladder wall thickening inferiorly, can be seen with urinary tract infection. Recommend correlation with urinalysis. 4. Colonic diverticulosis without diverticulitis. Aortic Atherosclerosis (ICD10-I70.0). Electronically Signed   By: [place] Rake M.D.   On: 06/09/2019 00:09   Dg Chest Portable 1 View  Result Date: 06/08/2019 CLINICAL DATA:  Altered mental status. COVID. EXAM: PORTABLE CHEST 1 VIEW COMPARISON:  Chest x-ray dated 05/15/2019 FINDINGS: The heart size and pulmonary vascularity are normal the lungs are clear. No effusions. No acute bone abnormality.  Aortic atherosclerosis. IMPRESSION: 1. No active cardiopulmonary disease. 2. Aortic atherosclerosis. Electronically Signed   By: Lorriane Shire M.D.   On: 06/08/2019 19:43   Dg Chest Port 1v Same Day  Result Date: 06/10/2019 CLINICAL DATA:  Shortness of breath EXAM: PORTABLE CHEST 1 VIEW COMPARISON:  06/08/2019 FINDINGS: Heart is normal size. No confluent airspace opacities or effusions. No acute bony abnormality. IMPRESSION: No acute cardiopulmonary disease. Electronically Signed   By: Rolm Baptise M.D.   On: 06/10/2019 08:40   Ct Head Code Stroke Wo Contrast  Result Date: 06/10/2019 CLINICAL DATA:  Code stroke. Subacute neuro deficits. Slurred speech EXAM: CT HEAD WITHOUT CONTRAST TECHNIQUE: Contiguous axial images were obtained from the base of the skull through the vertex without intravenous contrast. COMPARISON:  05/17/2019 FINDINGS: Brain: No evidence of acute infarction, hemorrhage, hydrocephalus, extra-axial collection or mass lesion/mass effect. 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 No hyperdense vessel. Skull: Normal. Negative for fracture or focal lesion. Sinuses/Orbits: No acute finding. Other: These results were communicated to [place] [facility] [place] 10/23/2020by text page via the [facility] messaging system. ASPECTS [facility] Stroke Program Early CT Score) - Ganglionic level infarction (caudate, lentiform nuclei, internal capsule, insula, M1-M3 cortex): 7 - Supraganglionic infarction (M4-M6 cortex): 3 Total score (0-10 with 10 being normal): 10 IMPRESSION: 1. No acute or interval finding. ASPECTS is 10. 2. Atherosclerotic calcification. Electronically Signed   By: Monte Fantasia M.D.   On: 06/10/2019 09:00   Vas US Carotid (at [facility] Only)  Result Date: 06/10/2019 Carotid Arterial Duplex  Study Indications:       CVA. Limitations        Today's exam was limited due to lighting in the room. Comparison  Study:  No prior. Performing Technologist: Oda Cogan RDMS, RVT  Examination Guidelines: A complete evaluation includes B-mode imaging, spectral Doppler, color Doppler, and power Doppler as needed of all accessible portions of each vessel. Bilateral testing is considered an integral part of a complete examination. Limited examinations for reoccurring indications may be performed as noted.  Right Carotid Findings: +----------+--------+--------+--------+------------------+--------+           PSV cm/sEDV cm/sStenosisPlaque DescriptionComments +----------+--------+--------+--------+------------------+--------+ CCA Prox  85      19                                         +----------+--------+--------+--------+------------------+--------+ CCA Distal86      17                                         +----------+--------+--------+--------+------------------+--------+ ICA Prox  50      20      1-39%   heterogenous               +----------+--------+--------+--------+------------------+--------+ ICA Distal57      17                                         +----------+--------+--------+--------+------------------+--------+ ECA       73      11                                         +----------+--------+--------+--------+------------------+--------+ +----------+--------+-------+------------+-------------------+           PSV cm/sEDV cmsDescribe    Arm Pressure (mmHG) +----------+--------+-------+------------+-------------------+ Subclavian               Not assessed                    +----------+--------+-------+------------+-------------------+ +---------+--------+--------+------------+ VertebralPSV cm/sEDV cm/sNot assessed +---------+--------+--------+------------+  Left Carotid Findings: +----------+--------+--------+--------+------------------+--------+           PSV cm/sEDV cm/sStenosisPlaque DescriptionComments  +----------+--------+--------+--------+------------------+--------+ CCA Prox  98                                                 +----------+--------+--------+--------+------------------+--------+ CCA Distal65                                                 +----------+--------+--------+--------+------------------+--------+ ICA Prox  68      15      1-39%   heterogenous               +----------+--------+--------+--------+------------------+--------+ ICA Distal81      24                                         +----------+--------+--------+--------+------------------+--------+  ECA       71      7                                          +----------+--------+--------+--------+------------------+--------+ +----------+--------+--------+--------------+-------------------+           PSV cm/sEDV cm/sDescribe      Arm Pressure (mmHG) +----------+--------+--------+--------------+-------------------+ Subclavian                Not identified                    +----------+--------+--------+--------------+-------------------+ +---------+--------+--------+------------+ VertebralPSV cm/sEDV cm/sNot assessed +---------+--------+--------+------------+  Summary: Right Carotid: Velocities in the right ICA are consistent with a 1-39% stenosis. Left Carotid: Velocities in the left ICA are consistent with a 1-39% stenosis. Vertebrals: Bilateral vertebral arteries were not visualized. *See table(s) above for measurements and observations.     Preliminary     Assessment: 68 y.o. female with atrial fibnllation transferred from [facility] to [facility] for stroke/TIA work up in the setting of new onset slurred speech, slight right facial droop and dysarthria.  1. Neurological exam reveals decreased sensation to right face, RUE and LLE. No asymmetry on motor exam, but with BLE weakness  that she states is chronic, in conjunction with distal lower extremity muscle atrophy. 2. CT head no  acute or interval finding. Atherosclerotic calcifications are noted. 3. Carotid ultrasound performed today: 1-39% stenosis of bilateral ICAs. Bilateral vertebral arteries were not visualized.  4. Stroke Risk Factors - atrial fibnllation, LV thrombus, CHF, DM, HTN and morbid obesity  5. Echocardiogram report pending.  6. Overall clinical impression is that the DDx for her acute change includes encephalopathy in the setting of infection as the most likely etiology, versus TIA or stroke.   Plan: 1. HgbA1c, fasting lipid panel 2. MRI, MRA of the brain without contrast 3. PT consult, OT consult, Speech consult 4. Continue anticoagulation.  5. Telemetry monitoring 6. Frequent neuro checks   @Electronically  signed: Dr. Kerney Elbe  06/10/2019, 6:04 PM

## 2019-06-10 NOTE — Evaluation (Signed)
Speech Language Pathology Evaluation Patient Details Name: [name] MRN: [id number] DOB: 09-Nov-1950 Today's Date: 06/10/2019 Time: 8182-9937 SLP Time Calculation (min) (ACUTE ONLY): 23 min  Problem List:  Patient Active Problem List   Diagnosis Date Noted  . Acute delirium 06/03/2019  . Hypertension associated with type 2 diabetes mellitus ([place]) 05/31/2019  . GERD without esophagitis 05/31/2019  . CKD stage 3 due to type 2 diabetes mellitus ([place]) 05/31/2019  . Anemia due to stage 3a chronic kidney disease 05/31/2019  . Protein-calorie malnutrition, severe ([place]) 05/31/2019  . Acute encephalopathy 05/25/2019  . Atrial fibrillation with tachycardic ventricular rate ([place]) 04/28/2019  . Uncontrolled type 2 diabetes mellitus with chronic kidney disease, with long-term current use of insulin ([place]) 04/22/2019  . Pyoderma gangrenosum 03/11/2019  . Wound infection 03/09/2019  . Chronic wound infection of abdomen   . Wheelchair bound   . Abdominal wall skin ulcer ([place]) 03/07/2019  . Acute pulmonary edema ([place]) 02/17/2019  . Iron deficiency anemia 02/01/2019  . Abdominal wall ulcer, with fat layer exposed ([place]) 01/16/2019  . Elevated troponin   . Nausea and vomiting   . Symptomatic bradycardia 01/10/2019  . DKA (diabetic ketoacidoses) ([place]) 01/10/2019  . Urinary incontinence 12/28/2018  . Recurrent urinary tract infection 12/16/2018  . Wound, open, anterior abdominal wall 12/05/2018  . Hypotension due to hypovolemia   . Gastritis due to nonsteroidal anti-inflammatory drug   . Esophageal dysphagia   . GI bleed 09/03/2018  . Coagulopathy ([place])   . Colitis   . Lower abdominal pain   . Rectal bleeding   . Dehydration 08/16/2018  . Acute on chronic diastolic CHF (congestive heart failure) ([place]) 08/10/2018  . Acute lower UTI 08/10/2018  . Hypokalemia 08/10/2018  . COPD (chronic obstructive pulmonary disease) ([place]) 08/10/2018  . Thyroid lesion 08/10/2018  . Closed fracture of right  ankle 06/10/2018  . Closed fracture of left tibia and fibula with routine healing 04/13/18 06/10/2018  . Atrial fibrillation ([place]) 04/15/2018  . Chronic diastolic HF (heart failure) ([place]) 04/15/2018  . CKD (chronic kidney disease), stage III 04/15/2018  . Left tibial fracture 04/14/2018  . CHF exacerbation ([place]) 04/10/2018  . SOB (shortness of breath)   . Acute CHF (congestive heart failure) ([place]) 04/01/2018  . Atrial fibrillation with RVR ([place]) 04/01/2018  . [place] 03/11/2016  . Muscle spasms of lower extremity 03/04/2016  . Acute renal insufficiency 02/26/2016  . History of MRSA infection 02/26/2016  . HTN (hypertension) 02/19/2016  . Fracture, femur, distal ([place]) 02/19/2016  . Fall 02/19/2016  . Depression 02/19/2016  . Femur fracture, left ([place]) 02/19/2016   Past Medical History:  Past Medical History:  Diagnosis Date  . Atrial fibrillation ([place])   . CHF (congestive heart failure) ([place])   . Depression   . Diabetes mellitus without complication ([place])   . 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 Hypertension   . Morbid obesity ([place])   . Osteoporosis   . Urinary incontinence   . UTI (lower urinary tract infection) 01/2016   Cipro for Klebsiella pneumoniae isolate  . Wheelchair bound    bedbound   Past Surgical History:  Past Surgical History:  Procedure Laterality Date  . ABDOMINAL HYSTERECTOMY    . APPLICATION OF WOUND VAC  03/09/2019   Procedure: APPLICATION OF WOUND VAC;  Surgeon: Virl Cagey, MD;  Location: [facility];  Service: General;;  . BIOPSY  09/06/2018   Procedure: BIOPSY;  Surgeon: Danie Binder, MD;  Location: [facility];  Service: Endoscopy;;  gastric bx's  . CESAREAN SECTION    . CHOLECYSTECTOMY    . ESOPHAGEAL DILATION  09/06/2018   Procedure: ESOPHAGEAL DILATION;  Surgeon: Danie Binder, MD;  Location: [facility];  Service: Endoscopy;;  . ESOPHAGOGASTRODUODENOSCOPY (EGD) WITH PROPOFOL N/A 09/06/2018    Procedure: ESOPHAGOGASTRODUODENOSCOPY (EGD) WITH PROPOFOL;  Surgeon: Danie Binder, MD;  Location: [facility];  Service: Endoscopy;  Laterality: N/A;  dilatation  . FEMUR IM NAIL Left 02/20/2016  . FEMUR IM NAIL Left 02/20/2016   Procedure: INTRAMEDULLARY (IM) RETROGRADE FEMORAL NAILING;  Surgeon: Leandrew Koyanagi, MD;  Location: [place];  Service: Orthopedics;  Laterality: Left;  . PANCREAS SURGERY  1967   1 cyst excised and one cyst drained  . TEE WITHOUT CARDIOVERSION N/A 04/05/2018   Procedure: TRANSESOPHAGEAL ECHOCARDIOGRAM (TEE);  Surgeon: Dorothy Spark, MD;  Location: [facility];  Service: Cardiovascular;  Laterality: N/A;  . [place]    . WOUND DEBRIDEMENT N/A 03/09/2019   Procedure: EXCISIONAL DEBRIDEMENT OF ABDOMINAL WOUND ULCERS;  Surgeon: Virl Cagey, MD;  Location: [facility];  Service: General;  Laterality: N/A;  . WRIST FRACTURE SURGERY     HPI:  68 y.o. 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 Constance Haw in [place]), A. fib on anticoagulation, CKD stage III, chronic diastolic heart failure-who presented to the ED 10/21 with acute metabolic encephalopathy, hypoglycemia and complicated UTI.  Patient was started on broad-spectrum antimicrobial therapy and subsequently admitted to the hospitalist service.  She was also found to have Covid 19+ on 10/21.  Due to concern for abdominal abscesses-a CT of the abdomen was done-this did not show any acute abnormalities.  Patient was subsequently transferred to [facility] on 10/22-as she was found to be COVID-19 positive.  Change in MS with acute dysarthria morning of 10/23; failed RN stroke swallow screen. awaiting stroke w/u.    Assessment / Plan / Recommendation Clinical Impression  Pt presents with mild dysarthria of speech (which she attributes to teeth extraction last month; her articulation did improve in  clarity as session progressed and after she consumed POs); mild right facial asymmetry and tongue deviation to the right.  She is oriented to person and elements of place, but not time.  She follows simple commands; has more difficulty with multi-step commands. Naming is WNL.  Fluency normal.  Inconsistent historian.  Recommend SLP f/u for further cognitive-linguistic assessment; pt is transferring back to [facility] for MRI this afternoon per RN.  Will follow.    SLP Assessment  SLP Visit Diagnosis: Cognitive communication deficit (R41.841)    Follow Up Recommendations  Other (comment)(tba)    Frequency and Duration min 1 x/week  2 weeks      SLP Evaluation Cognition  Overall Cognitive Status: Impaired/Different from baseline Arousal/Alertness: Awake/alert Orientation Level: Oriented to person;Disoriented to place;Disoriented to time Attention: Sustained Sustained Attention: Appears intact Awareness: Impaired Awareness Impairment: Intellectual impairment       Comprehension  Auditory Comprehension Overall Auditory Comprehension: Impaired Yes/No Questions: Within Functional Limits Commands: Impaired Two Step Basic Commands: 75-100% accurate Conversation: Simple Visual Recognition/Discrimination Discrimination: Within Function Limits Reading Comprehension Reading Status: Within funtional limits    Expression Expression Primary Mode of Expression: Verbal Verbal Expression Overall Verbal Expression: Appears within functional limits for tasks assessed Initiation: No impairment Level of Generative/Spontaneous Verbalization: Conversation Repetition: No impairment Naming: No  impairment Pragmatics: No impairment Written Expression Dominant Hand: Left Written Expression: Not tested   Oral / Motor  Oral Motor/Sensory Function Overall Oral Motor/Sensory Function: Mild impairment Facial Symmetry: Abnormal symmetry right;Suspected CN VII (facial) dysfunction Lingual Symmetry: Abnormal  symmetry right;Suspected CN XII (hypoglossal) dysfunction Motor Speech Overall Motor Speech: Impaired Respiration: Within functional limits Phonation: Normal Resonance: Within functional limits Articulation: Impaired Level of Impairment: Sentence Intelligibility: Intelligibility reduced Word: 75-100% accurate Phrase: 75-100% accurate Sentence: 75-100% accurate Motor Planning: Witnin functional limits   GO                    Juan Quam Laurice 06/10/2019, 4:02 PM Sadiyah Kangas L. Tivis Ringer, [place] Office number [phone]

## 2019-06-11 ENCOUNTER — Encounter: Payer: Self-pay | Admitting: Internal Medicine

## 2019-06-11 ENCOUNTER — Inpatient Hospital Stay (HOSPITAL_COMMUNITY): Payer: BC Managed Care – PPO

## 2019-06-11 DIAGNOSIS — N179 Acute kidney failure, unspecified: Secondary | ICD-10-CM | POA: Insufficient documentation

## 2019-06-11 DIAGNOSIS — B342 Coronavirus infection, unspecified: Secondary | ICD-10-CM | POA: Insufficient documentation

## 2019-06-11 DIAGNOSIS — K5909 Other constipation: Secondary | ICD-10-CM | POA: Insufficient documentation

## 2019-06-11 DIAGNOSIS — E875 Hyperkalemia: Secondary | ICD-10-CM | POA: Insufficient documentation

## 2019-06-11 DIAGNOSIS — N183 Chronic kidney disease, stage 3 unspecified: Secondary | ICD-10-CM | POA: Insufficient documentation

## 2019-06-11 DIAGNOSIS — L88 Pyoderma gangrenosum: Secondary | ICD-10-CM

## 2019-06-11 LAB — GLUCOSE, CAPILLARY
Glucose-Capillary: 260 mg/dL — ABNORMAL HIGH (ref 70–99)
Glucose-Capillary: 157 mg/dL — ABNORMAL HIGH (ref 70–99)
Glucose-Capillary: 245 mg/dL — ABNORMAL HIGH (ref 70–99)
Glucose-Capillary: 222 mg/dL — ABNORMAL HIGH (ref 70–99)

## 2019-06-11 LAB — CBC WITH DIFFERENTIAL/PLATELET
Abs Immature Granulocytes: 0.14 10*3/uL — ABNORMAL HIGH (ref 0.00–0.07)
Basophils Absolute: 0 10*3/uL (ref 0.0–0.1)
Basophils Relative: 0 %
Eosinophils Absolute: 0 10*3/uL (ref 0.0–0.5)
Eosinophils Relative: 0 %
HCT: 24.5 % — ABNORMAL LOW (ref 36.0–46.0)
Hemoglobin: 7.5 g/dL — ABNORMAL LOW (ref 12.0–15.0)
Immature Granulocytes: 1 %
Lymphocytes Relative: 7 %
Lymphs Abs: 1.1 10*3/uL (ref 0.7–4.0)
MCH: 26.4 pg (ref 26.0–34.0)
MCHC: 30.6 g/dL (ref 30.0–36.0)
MCV: 86.3 fL (ref 80.0–100.0)
Monocytes Absolute: 0.6 10*3/uL (ref 0.1–1.0)
Monocytes Relative: 4 %
Neutro Abs: 13.6 10*3/uL — ABNORMAL HIGH (ref 1.7–7.7)
Neutrophils Relative %: 88 %
Platelets: 294 10*3/uL (ref 150–400)
RBC: 2.84 MIL/uL — ABNORMAL LOW (ref 3.87–5.11)
RDW: 18.7 % — ABNORMAL HIGH (ref 11.5–15.5)
WBC: 15.3 10*3/uL — ABNORMAL HIGH (ref 4.0–10.5)
nRBC: 0 % (ref 0.0–0.2)

## 2019-06-11 LAB — ECHOCARDIOGRAM LIMITED
Height: 64 in
Weight: 2751.34 oz

## 2019-06-11 LAB — URINE CULTURE: Culture: >=100000 — AB

## 2019-06-11 LAB — COMPREHENSIVE METABOLIC PANEL
ALT: 20 U/L (ref 0–44)
AST: 22 U/L (ref 15–41)
Albumin: 1.2 g/dL — ABNORMAL LOW (ref 3.5–5.0)
Alkaline Phosphatase: 123 U/L (ref 38–126)
Anion gap: 8 (ref 5–15)
BUN: 30 mg/dL — ABNORMAL HIGH (ref 8–23)
CO2: 19 mmol/L — ABNORMAL LOW (ref 22–32)
Calcium: 7.5 mg/dL — ABNORMAL LOW (ref 8.9–10.3)
Chloride: 113 mmol/L — ABNORMAL HIGH (ref 98–111)
Creatinine, Ser: 0.83 mg/dL (ref 0.44–1.00)
GFR calc Af Amer: >60 mL/min (ref >60)
GFR calc non Af Amer: >60 mL/min (ref >60)
Glucose, Bld: 289 mg/dL — ABNORMAL HIGH (ref 70–99)
Potassium: 3.3 mmol/L — ABNORMAL LOW (ref 3.5–5.1)
Sodium: 140 mmol/L (ref 135–145)
Total Bilirubin: 0.4 mg/dL (ref 0.3–1.2)
Total Protein: 4.2 g/dL — ABNORMAL LOW (ref 6.5–8.1)

## 2019-06-11 LAB — HEMOGLOBIN A1C
Hgb A1c MFr Bld: 7.4 % — ABNORMAL HIGH (ref 4.8–5.6)
Mean Plasma Glucose: 165.68 mg/dL

## 2019-06-11 LAB — LIPID PANEL
Cholesterol: 53 mg/dL (ref 0–200)
HDL: 11 mg/dL — ABNORMAL LOW (ref >40)
LDL Cholesterol: 25 mg/dL (ref 0–99)
Total CHOL/HDL Ratio: 4.8 RATIO
Triglycerides: 86 mg/dL (ref <150)
VLDL: 17 mg/dL (ref 0–40)

## 2019-06-11 LAB — LACTATE DEHYDROGENASE: LDH: 258 U/L — ABNORMAL HIGH (ref 98–192)

## 2019-06-11 LAB — MAGNESIUM: Magnesium: 2 mg/dL (ref 1.7–2.4)

## 2019-06-11 LAB — FERRITIN: Ferritin: 227 ng/mL (ref 11–307)

## 2019-06-11 LAB — C-REACTIVE PROTEIN: CRP: 19 mg/dL — ABNORMAL HIGH (ref <1.0)

## 2019-06-11 IMAGING — MR MR ABDOMEN WO/W CM MRCP
10 of 20 series · 20 of 48 positions shown · IV contrast (gadavist)
Comparison: CT scan 09/03/2018

CLINICAL DATA: Pancreatic body lesion for further characterization.

EXAM:
MRI ABDOMEN WITHOUT AND WITH CONTRAST (INCLUDING MRCP)
TECHNIQUE: Multiplanar multisequence MR imaging of the abdomen was performed
both before and after the administration of intravenous contrast.
Heavily T2-weighted images of the biliary and pancreatic ducts were
obtained, and three-dimensional MRCP images were rendered by post
processing.
CONTRAST:  10 cc Gadavist

[Series 3: T2 · coronal · 5.0mm · 1.04mm/px · 1 of 36 slices shown (1 of 2)]
[im 1/36]
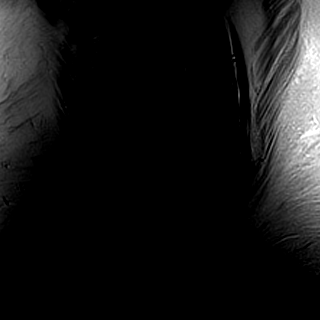

[Series 4: t2fs axial blade · axial · 4.0mm · 1.16mm/px · 1 of 48 slices shown]
[im 1/48]
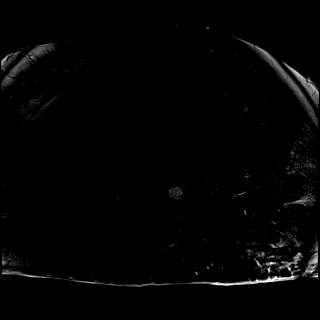

[Series 6: MRCP · coronal · 4.0mm · 0.78mm/px · 1 of 15 slices shown]
[im 1/15]
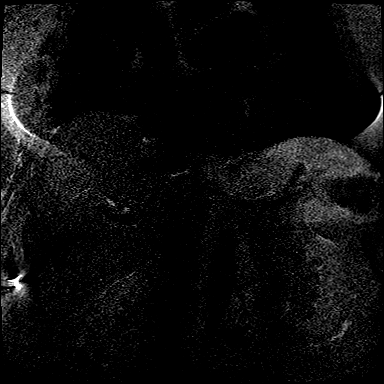

[Series 7: T2 · coronal · 1.0mm · 0.30mm/px · 3 of 104 slices shown (2 of 2)]
[im 1/104]
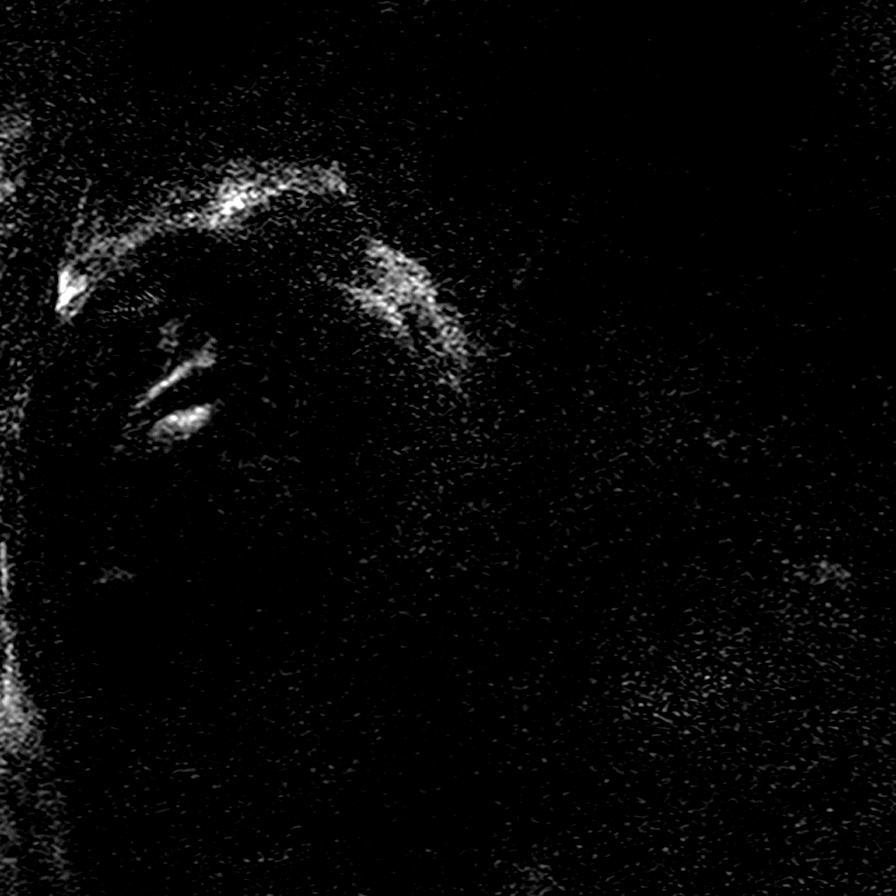
[im 52/104]
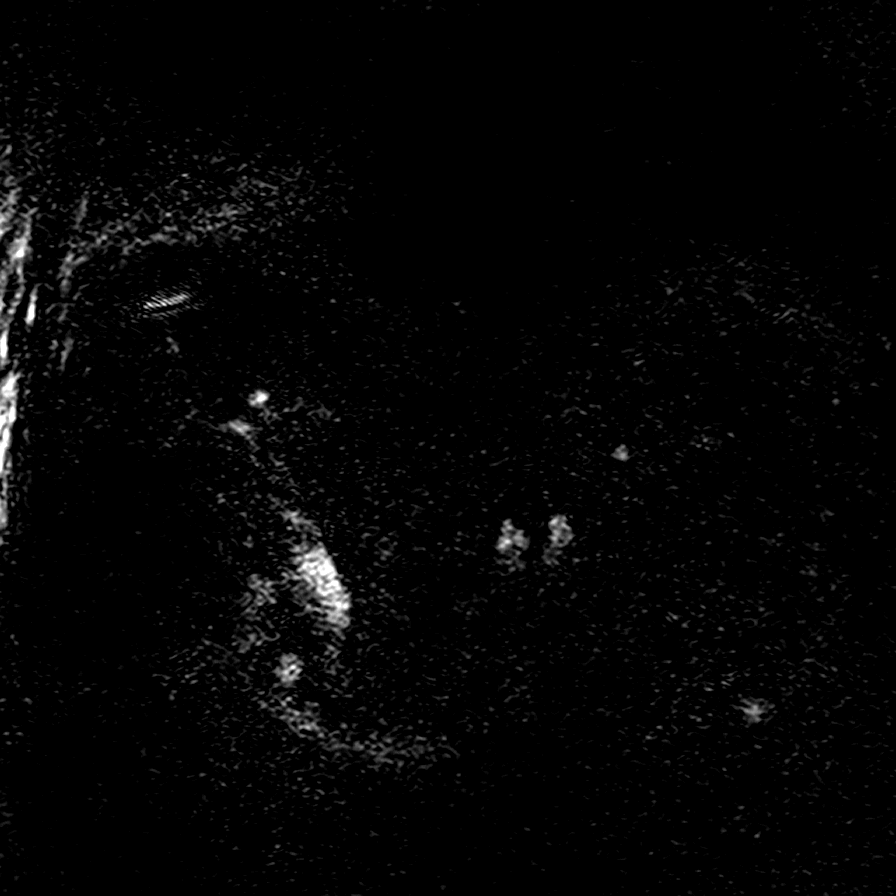
[im 104/104]
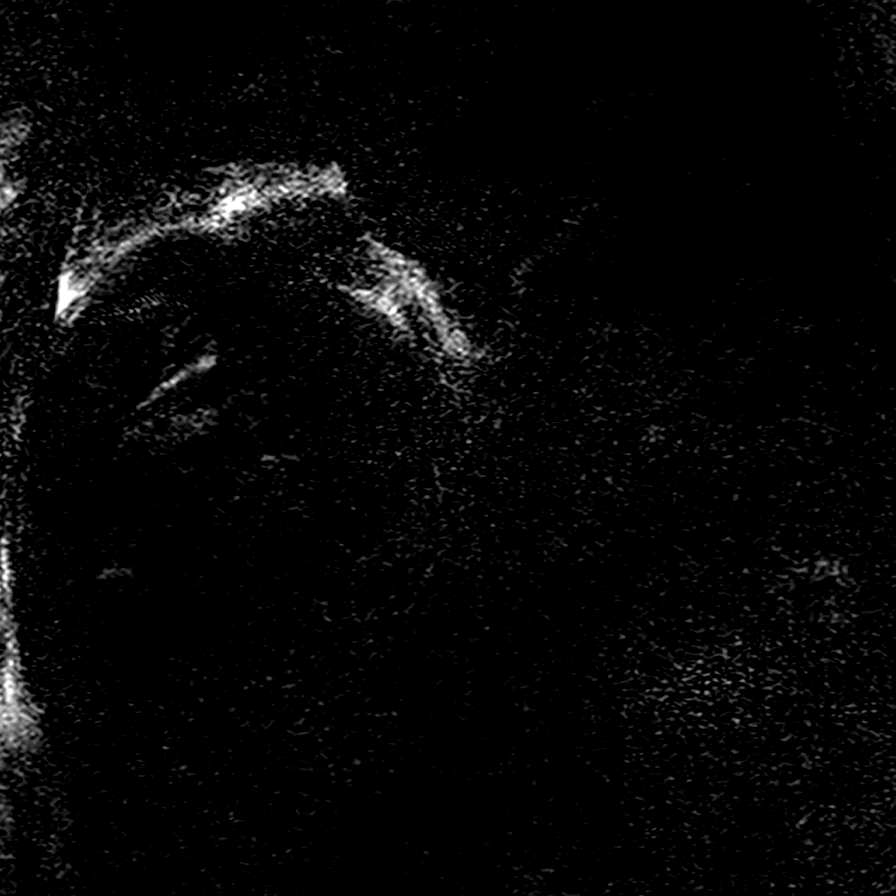

[Series 10: DWI · axial · 5.0mm · 0.93mm/px · z∈[-173,+61]mm · 2 of 80 slices shown (1 of 2)]
[im 1/80]
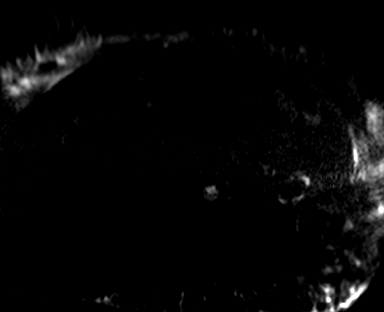
[im 80/80]
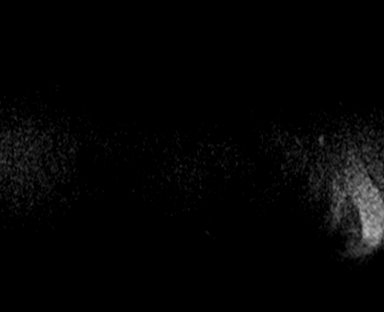

[Series 11: DWI · axial · 5.0mm · 0.93mm/px · z∈[-173,+61]mm · 2 of 40 slices shown (2 of 2)]
[im 1/40]
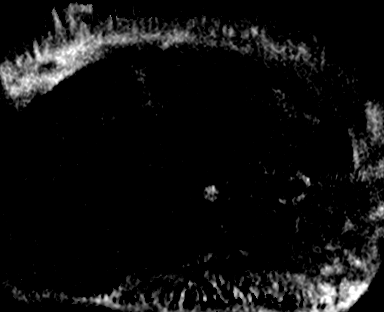
[im 40/40]
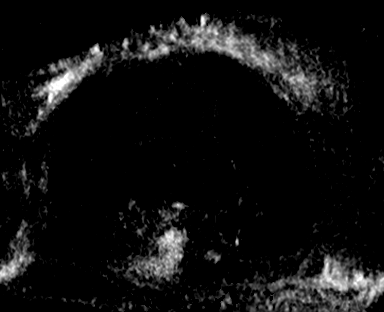

[Series 12: bSSFP · axial · 4.0mm · 1.43mm/px · z∈[-194,+82]mm · 3 of 70 slices shown]
[im 1/70]
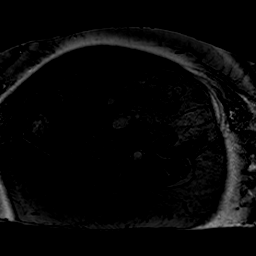
[im 35/70]
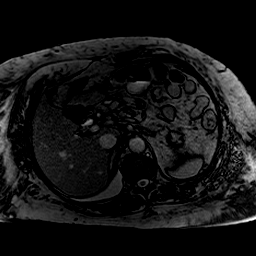
[im 70/70]
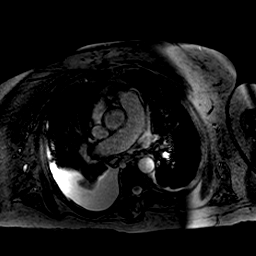

[Series 13: T1 · axial · 4.0mm · 0.57mm/px · z∈[-182,+70]mm · 2 of 64 slices shown (1 of 2)]
[im 1/64]
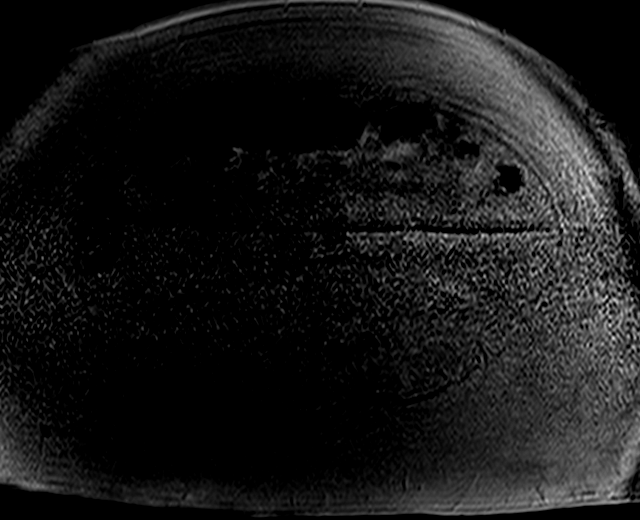
[im 64/64]
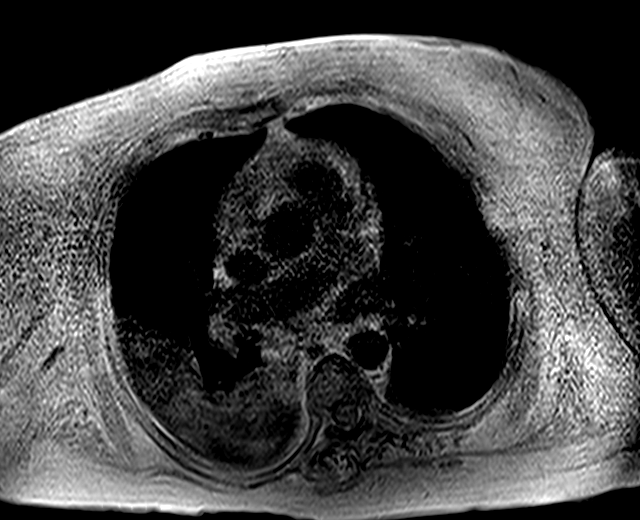

[Series 14: T1 · axial · 4.0mm · 0.57mm/px · z∈[-182,+70]mm · 2 of 64 slices shown (2 of 2)]
[im 1/64]
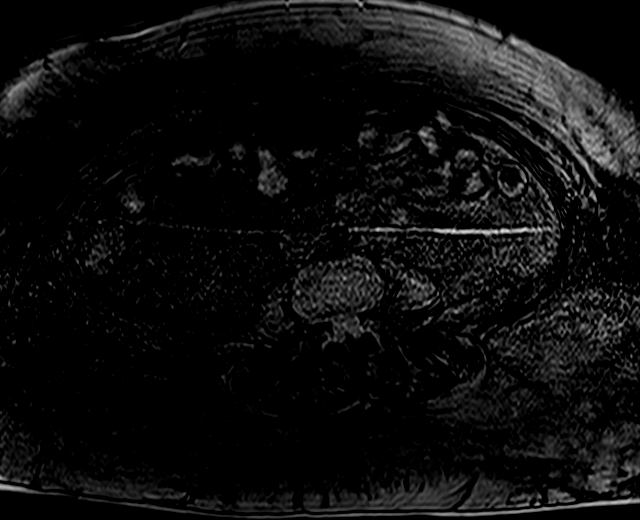
[im 64/64]
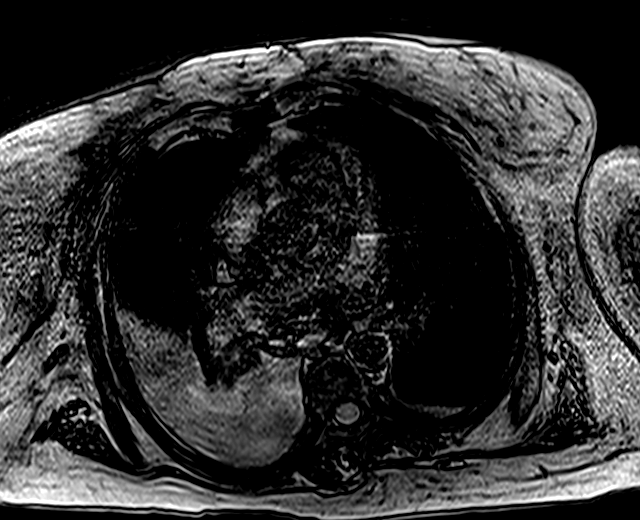

[Series 25: t1fs coronal post · coronal · 2.5mm · 1.20mm/px · 3 of 96 slices shown]
[im 1/96]
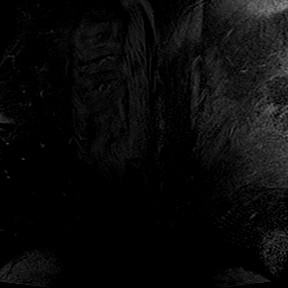
[im 32/96]
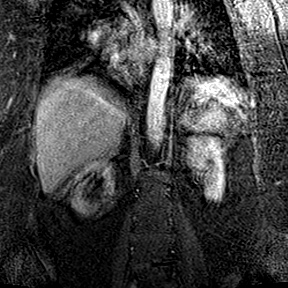
[im 64/96]
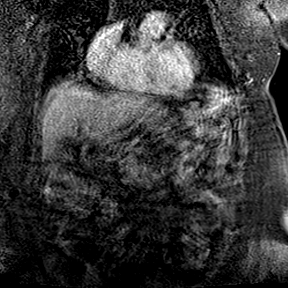

[20 of 48 positions shown; findings below may reference images not displayed]

FINDINGS: Despite efforts by the technologist and patient, motion artifact is
present on today's exam and could not be eliminated. This reduces
exam sensitivity and specificity.

Lower chest: Moderate right and small left pleural effusions with
cardiomegaly. Passive atelectasis of the right lower lobe.

Hepatobiliary: Mild extrahepatic biliary dilatation, CBD 1.1 cm in
diameter, likely a physiologic response to prior cholecystectomy. No
significant abnormal hepatic enhancement.

Pancreas: Severe pancreatic atrophy. Multiple dilated side ducts
along the pancreatic tail and body, most at or below 5 mm in
diameter. The more confluent lesion shown on recent CT has resolved
and probably represented a lymph node adjacent to the pancreas or a
small fluid collection. No worrisome pancreatic findings are
currently identified.

Spleen:  Unremarkable

Adrenals/Urinary Tract: 2 small hypodense lesions in the left kidney
are likely cysts although technically too small to characterize due
to the degree of motion artifact. Adrenal glands normal.

Stomach/Bowel: Unremarkable

Vascular/Lymphatic: Aortoiliac atherosclerotic vascular disease. No
pathologic adenopathy is identified.

Other:  Diffuse subcutaneous edema especially along the flanks.

Musculoskeletal: Unremarkable
IMPRESSION: 1. The lesion of concern on prior CT has resolved and was probably a
lymph node or small transient fluid collection. Although there are
some tiny side duct dilatations along the pancreas, these are highly
likely to be the result of prior pancreatitis and did not require
further workup. Underlying pancreatic atrophy noted.
2. Moderate right and small left pleural effusion.
3. Cardiomegaly.
4. Extrahepatic biliary prominence is likely a physiologic response
to cholecystectomy.
5.  Aortic Atherosclerosis ([id number]).
6. Subcutaneous edema along the flanks.
7. Despite efforts by the technologist and patient, motion artifact
is present on today's exam and could not be eliminated. This reduces
exam sensitivity and specificity.

## 2019-06-11 MED ORDER — LIDOCAINE VISCOUS HCL 2 % MT SOLN
15.0000 mL | Freq: Four times a day (QID) | OROMUCOSAL | Status: DC | PRN
  Administered 2019-06-11 – 2019-06-13 (×2): 15 mL via OROMUCOSAL
  Filled 2019-06-11 (×3): qty 15

## 2019-06-11 MED ORDER — POTASSIUM CHLORIDE 10 MEQ/100ML IV SOLN
10.0000 meq | INTRAVENOUS | Status: AC
  Administered 2019-06-11 (×3): 10 meq via INTRAVENOUS
  Filled 2019-06-11 (×3): qty 100

## 2019-06-11 MED ORDER — FOLIC ACID 5 MG/ML IJ SOLN
1.0000 mg | Freq: Every day | INTRAMUSCULAR | Status: DC
  Administered 2019-06-11 – 2019-06-12 (×2): 1 mg via INTRAVENOUS
  Filled 2019-06-11 (×3): qty 0.2

## 2019-06-11 NOTE — Progress Notes (Signed)
MD paged d/t pt having difficulty follow any commands to complete NIH. Pt also exhibits defeceits differentiating sides.For example, raise r arm when asked, but when ask to raise left arm-raises right arm again.

## 2019-06-11 NOTE — Progress Notes (Signed)
STROKE TEAM PROGRESS NOTE   HISTORY OF PRESENT ILLNESS (per record) [name] is an 68 y.o. female with atrial fibnllation, LV thrombus, CHF, DM, HTN and morbid obesity who has been transferred from [facility] to [facility] for stroke/TIA work up. She initially presented on 10/21 the day after testing positive for Covid-19 with cough, sore throat and mild dyspnea. She had recently had a prolonged hospitalization from late September to early October for acute encephalopathy, with the exact etiology not having been identified. She had competed a 3 week course of IV Invanz on 10/3,for chronic abdominal wounds.  This morning RN noted slurred speech and on Hospitalist exam she had slight right facial droop with dysarthria, the latter being confirmed by patient as being new. Also noted was slight right leg weakness relative to the left, which also was weak - these latter findings were old per patient. Her LKN was not known. The main components of the Hospitalist's DDx were CVA and metabolic encephalopathy.   Of note, she is on apixaban 5 mg PO BID.   STAT head CT revealed no acute changes. She was then transferred to [facility] for stroke evaluation.    LSN: Unknown tPA Given: No. LKN unknown.    INTERVAL HISTORY No major events overnight.  I reviewed MRI Brain.   There is no acute infarct.  There is global atrophy and mild periventricular small vessel ischemic disease. She is very lethargic and is not interested in interacting.  Carotid doppler ultrasound was normal.    WBC 15K.     OBJECTIVE Vitals:   06/10/19 1600 06/10/19 1700 06/10/19 2051 06/10/19 2231  BP: (!) 114/54 (!) 108/54 119/74 (!) 114/43  Pulse: (!) 108  78 82  Resp: 18  18   Temp:  98.6 F (37 C)    TempSrc:   Oral   SpO2: 93%  93%   Weight:      Height:        CBC:  Recent Labs  Lab 06/10/19 0440 06/11/19 0834  WBC 22.5* 15.3*  NEUTROABS 20.4* 13.6*  HGB 7.3* 7.5*  HCT 24.0* 24.5*  MCV 86.3 86.3  PLT 329 294     Basic Metabolic Panel:  Recent Labs  Lab 06/08/19 0600  06/10/19 0440 06/11/19 0834  NA 132*   < > 138 140  K 4.0   < > 3.5 3.3*  CL 105   < > 113* 113*  CO2 18*   < > 17* 19*  GLUCOSE 158*   < > 117* 289*  BUN 62*   < > 39* 30*  CREATININE 2.20*   < > 1.05* 0.83  CALCIUM 7.5*   < > 7.0* 7.5*  MG 4.6*  --   --  2.0   < > = values in this interval not displayed.    Lipid Panel:     Component Value Date/Time   CHOL 53 06/11/2019 0451   TRIG 86 06/11/2019 0451   HDL 11 (L) 06/11/2019 0451   CHOLHDL 4.8 06/11/2019 0451   VLDL 17 06/11/2019 0451   LDLCALC 25 06/11/2019 0451   HgbA1c:  Lab Results  Component Value Date   HGBA1C 7.4 (H) 06/11/2019   Urine Drug Screen: No results found for: LABOPIA, COCAINSCRNUR, LABBENZ, AMPHETMU, THCU, LABBARB  Alcohol Level No results found for: [facility]  IMAGING  Dg Chest Port 1v Same Day 06/10/2019 IMPRESSION:  No acute cardiopulmonary disease.   Ct Head Code Stroke Wo Contrast 06/10/2019 IMPRESSION:  1.  No acute or interval finding. ASPECTS is 10.  2. Atherosclerotic calcification.    MRI Head WO Contrast - pending 06/11/2019   Vas US Carotid (at [place] Only) 06/10/2019 Summary:  Right Carotid: Velocities in the right ICA are consistent with a 1-39% stenosis.  Left Carotid: Velocities in the left ICA are consistent with a 1-39% stenosis.  Vertebrals: Bilateral vertebral arteries were not visualized.     Preliminary     Transthoracic Echocardiogram  01/11/2019 IMPRESSIONS  1. The left ventricle has low normal systolic function, with an ejection fraction of 50-55%. The cavity size was normal. There is mild concentric left ventricular hypertrophy. Left ventricular diastolic Doppler parameters are consistent with pseudonormalization. Elevated mean left atrial pressure There is right ventricular volume overload. No evidence of left ventricular regional wall motion abnormalities.  2. The right ventricle has normal  systolic function. The cavity was normal. There is no increase in right ventricular wall thickness. Right ventricular systolic pressure is mildly elevated with an estimated pressure of 47.9 mmHg.  3. Left atrial size was moderately dilated.  4. The mitral valve is degenerative. Mild thickening of the mitral valve leaflet. There is severe mitral annular calcification present. Possible very mild mitral valve stenosis (normal gradients at slow heart rate of 58 bpm).  5. The tricuspid valve is grossly normal. Tricuspid valve regurgitation is moderate-severe.  6. The aortic valve is tricuspid. Moderate thickening of the aortic valve. Mild calcification of the aortic valve.  7. The aortic root and ascending aorta are normal in size and structure.    ECG - AF rate 126 BPM. (See cardiology reading for complete details)   EEG  05/17/2019 IMPRESSION: This study is suggestive of moderate diffuse encephalopathy, non specific to etiology.  No seizures or epileptiform discharges were seen throughout the recording.   PHYSICAL EXAM Blood pressure (!) 114/43, pulse 82, temperature 98.6 F (37 C), resp. rate 18, height 5' 4"  (1.626 m), weight 78 kg, SpO2 93 %.   She was very lethargy and did not cooperate with the exam.  She is oriented to place, but not year, month, date. No facial asymmetry.   Withdraws to pain in all 4 extremities. Will not cooperate with official strength testing. No babinski.  No hoffman's. Reflexes symmetrical and low normal.    ASSESSMENT/PLAN [name] is a 68 y.o. female with history of atrial fibrillation (Eliquis), LV thrombus by TEE 2019, prolonged hospitalization from late September to early October for acute encephalopathy (etiology unknown), completed a 3 week course of IV Invanz on 10/3,for chronic abdominal wounds, wheelchair / bedbound PTA, CHF, DM, HTN and morbid obesity who recently tested positive for Covid-19 with cough, sore throat and mild dyspnea.  On 06/10/19 RN at [facility] noted pt with slurred speech and right facial droop -> tx'd to [facility] for stroke w/u.  She did not receive IV t-PA due to unknown time of onset.  Suspected stroke vs TIA:  MRI pending  Resultant  encephalopathy  Code Stroke CT Head - No acute or interval finding. ASPECTS is 10.   CT head   MRI head - pending  MRA head - not ordered  CTA H&N - not ordered  CT Perfusion - not ordered  Carotid Doppler - carotids normal. Bilateral vertebral arteries were not visualized.    2D Echo - pending  Lacey Jensen Virus 2 - positive  LDL - 25  HgbA1c - 7.4  UDS - not ordered  VTE prophylaxis -  Eliquis Diet  Diet Order            DIET DYS 3 Room service appropriate? Yes; Fluid consistency: Thin  Diet effective now              Eliquis (apixaban) daily prior to admission, now on Eliquis (apixaban) daily  Patient counseled to be compliant with her antithrombotic medications  Ongoing aggressive stroke risk factor management  Therapy recommendations:  pending  Disposition:  Pending  Hypertension  Home BP meds: diiltiazem ; metoprolol  Current BP meds: metoprolol  Blood pressure somewhat low at times . Permissive hypertension (OK if < 220/120) but gradually normalize in 5-7 days  . Long-term BP goal normotensive  Hyperlipidemia  Home Lipid lowering medication: none  LDL 25, goal < 70  Current lipid lowering medication: None   Continue statin at discharge  Diabetes  Home diabetic meds: insulin  Current diabetic meds:  HgbA1c 7.8, goal < 7.0 Recent Labs    06/10/19 0822 06/10/19 1220 06/11/19 0831  GLUCAP 110* 110* 260*    Other Stroke Risk Factors  Advanced age  Obesity, Body mass index is 29.52 kg/m., recommend weight loss, diet and exercise as appropriate   Family hx stroke (father ; mother ; brother)  Congestive Heart Failure  Atrial fibrillation  Other Active Problems  Anemia - Hb 7.5  Mild  Leukocytosis - 22.5->15.3 (afebrile)  Hypokalemia - 3.3 - supplemented  Sars Corona Virus 2 - positive 06/08/19  Hypocalcemia -7.5    Hospital day # 3  No evidence of acute stroke on imaging.  Exam was difficult but appears symmetrical  She appears encephalopathic, likely due to COVID19 and other infections.  Cont Eliquis for cardioembolic stroke prevention.  Will sign off.   Rogue Jury, MS, MD  To contact Stroke Continuity provider, please refer to [email or web address] After hours, contact General Neurology

## 2019-06-11 NOTE — Progress Notes (Signed)
MD present to evaluate pt. Will hold am meds until MRI complete.

## 2019-06-11 NOTE — Progress Notes (Signed)
Patient ID: [name], female   DOB: 03/12/1951, 68 y.o.   MRN: [id number]  PROGRESS NOTE    SENG FOUTS  PPJ:[id number] DOB: July 30, 1951 DOA: 06/08/2019 PCP: [name], MD   Brief Narrative:  68 year old female with history of pyoderma gangrenosum with chronic abdominal wound followed by surgeon Dr. Blake Divine in [place], MRSA infection, chronic diastolic CHF, atrial fibrillation, COPD, CKD stage III, hypertension presented from rehab facility on 06/08/2019 with altered mental status and hypoglycemia.  She tested positive for COVID-19 on 06/07/2019 and has been complaining of mild cough with mild difficulty breathing and sore throat.  She has a chronic draining wound to her lower abdomen from pyoderma gangrenosum.  She had a recent hospitalization from 05/09/2019-05/25/2019 at [facility] for episodes of hypoglycemia and encephalopathy which required neurology evaluation and EEG which showed no evidence of seizures and she was discharged to rehab facility.  Prior to that, she was discharged on 04/30/2023 on IV Invanz for ESBL bacteremia secondary to infected abdominal wound.  She completed 3 weeks of IV Invanz which was discontinued on 05/21/2019.  Prior to that, she has had chronic abdominal wound recently secondary to diagnosis of pyoderma gangrenosum and also was treated with steroids.  On presentation to the ED on 06/08/2019, she was found to be tachycardic with leukocytosis of 29 and elevated creatinine.  Chest x-ray was negative for acute abnormality.  She was started on broad-spectrum antibiotic.  CT of the abdomen and pelvis was ordered.  She was transferred to [facility] because of COVID-19 being positive.  General surgery did not recommend any surgical intervention.  She was transferred to [place] on 06/09/2019.  On 08/10/2019, she had some slurred speech and question of facial droop.  CT of the brain was negative for acute abnormality.  Neurology recommended  MRI of the brain and transfer to [facility].  She was transferred to [facility] on 06/10/2019.  Assessment & Plan:  Slurring of speech/right facial droop with dysarthria: Question of TIA/CVA -She was found to be dysarthric with slight right-sided facial droop on 06/10/2019.  CT of the head was negative for acute abnormality.  Neurology recommended MRI and transfer to [facility] is pending. -Echo report pending. -Ultrasound carotid showed bilateral ICA 1 to 39% stenosis -PT/OT/SLP evaluation -LDL 25.  A1c 7.4 -Continue Eliquis. -Patient is awake today, very slow to respond to questions, does not follow most commands.   Metabolic encephalopathy -Most likely secondary to UTI/COVID-19 infection/hypoglycemia -Monitor mental status.  Mental status as above -Fall precautions. -During her last hospitalization from 05/09/2019-05/25/2019 at [facility], she had encephalopathy for which neurology had evaluated the patient and she had undergone EEG which was negative for seizures.  COVID-19 infection -Reports mild cough with some difficulty breathing and sore throat -Chest x-ray was negative for acute abnormality. -Currently on room air.  Recent Labs    06/08/19 1850 06/09/19 0012 06/09/19 0500 06/10/19 0400  DDIMER  --  <0.27 0.32  --   FERRITIN 181  --  175  --   LDH  --  211*  --   --   CRP 11.7*  --  11.5* 14.5*    Lab Results  Component Value Date   SARSCOV2NAA POSITIVE (A) 06/08/2019   SARSCOV2NAA NEGATIVE 05/09/2019   [place] NEGATIVE 04/27/2019   [place] NEGATIVE 04/23/2019   -Monitor inflammatory markers -We will hold off on remdesivir and Solu-Medrol for now. -Continue vitamin C and zinc. -  Patient has been transferred back to [facility] from [place] abdominal wound in a patient with history of pyoderma gangrenosum -CT of the abdomen and pelvis did not show any evidence of abscess or drainable  collection -Patient was empirically started on broad-spectrum antibiotics on presentation.  Patient had recent treatment with 3 weeks of IV Invanz for her abdominal wound infection which was discontinued on 05/21/2019.  She follows up with Dr. Mendel Ryder Bridges/surgeon as an outpatient. -General surgery evaluated the patient and recommended no surgical intervention.  Wound care as per general surgery recommendations.  Leukocytosis -Antibiotic plan as below.  White count improving.  Will monitor.  Hypokalemia -Replace intravenously  Probable UTI  -Urine culture is growing E pneumonia.  Follow sensitivities.  Continue meropenem for now  Acute kidney injury on chronic any disease stage III -Creatinine baseline 0.8-1.3. -Creatinine was 2.24 on 06/07/2019.  Resolved.  Monitor creatinine.  Of IV fluids. Diabetes mellitus type 2 with hypoglycemia -Lantus and NovoLog held.  Continue monitoring CBGs.  Hypoalbuminemia -Probably because of very poor oral intake.  Nutrition eval.  Chronic diastolic CHF -Compensated.  Last echo in 12/2018 showed EF of 50 to 75% with LV diastolic parameters consistent with pseudonormalization.  Not on diuretics.  Chronic atrial fibrillation -Currently rate controlled.  Restart Eliquis as there is no plans for any surgical intervention.  Metoprolol and Cardizem on hold because of low blood pressure.  Anemia of chronic disease -Questionable cause.  Monitor hemoglobin.  No evidence of any bleeding.  DVT prophylaxis:  Eliquis Code Status: Full Family Communication: Spoke with husband/John Trippett on phone on 06/11/2019 Disposition Plan: Depends on clinical outcome  Consultants: General surgery  Procedures: Echo report pending  Antimicrobials:  Anti-infectives (From admission, onward)   Start     Dose/Rate Route Frequency Ordered Stop   06/10/19 2200  vancomycin (VANCOCIN) 1,500 mg in sodium chloride 0.9 % 500 mL IVPB  Status:  Discontinued     1,500 mg 250 mL/hr  over 120 Minutes Intravenous Every 48 hours 06/08/19 2007 06/10/19 1057   06/10/19 1000  meropenem (MERREM) 1 g in sodium chloride 0.9 % 100 mL IVPB     1 g 200 mL/hr over 30 Minutes Intravenous Every 8 hours 06/10/19 0848     06/09/19 1000  meropenem (MERREM) 2 g in sodium chloride 0.9 % 100 mL IVPB  Status:  Discontinued     2 g 200 mL/hr over 30 Minutes Intravenous Every 12 hours 06/09/19 0855 06/10/19 0848   06/08/19 2100  ertapenem (INVANZ) 1,000 mg in sodium chloride 0.9 % 100 mL IVPB  Status:  Discontinued     1 g 200 mL/hr over 30 Minutes Intravenous Every 24 hours 06/08/19 2007 06/09/19 0855   06/08/19 1945  vancomycin (VANCOCIN) IVPB 1000 mg/200 mL premix  Status:  Discontinued     1,000 mg 200 mL/hr over 60 Minutes Intravenous  Once 06/08/19 1936 06/08/19 1942   06/08/19 1945  ceFEPIme (MAXIPIME) 2 g in sodium chloride 0.9 % 100 mL IVPB  Status:  Discontinued     2 g 200 mL/hr over 30 Minutes Intravenous  Once 06/08/19 1936 06/08/19 2135   06/08/19 1945  vancomycin (VANCOCIN) 1,500 mg in sodium chloride 0.9 % 500 mL IVPB     1,500 mg 250 mL/hr over 120 Minutes Intravenous  Once 06/08/19 1942 06/09/19 0017       Subjective: Patient seen and examined at bedside.  Poor historian.  Awake, answers his name but very  slow to respond to questions.  Slightly confused to time.  Does not follow much commands.  No overnight fever or vomiting reported Objective: Vitals:   06/10/19 1600 06/10/19 1700 06/10/19 2051 06/10/19 2231  BP: (!) 114/54 (!) 108/54 119/74 (!) 114/43  Pulse: (!) 108  78 82  Resp: 18  18   Temp:  98.6 F (37 C)    TempSrc:   Oral   SpO2: 93%  93%   Weight:      Height:        Intake/Output Summary (Last 24 hours) at 06/11/2019 0740 Last data filed at 06/11/2019 0400 Gross per 24 hour  Intake 680 ml  Output 601 ml  Net 79 ml   Filed Weights   06/08/19 1838  Weight: 78 kg    Examination:  General exam: No acute distress.  Looks chronically ill.   Poor historian. Respiratory system: Bilateral decreased breath sounds at bases.  Some crackles cardiovascular system: Rate controlled, S1-S2 heard Gastrointestinal system: Abdomen is nondistended, soft and mildly tender in the lower abdomen.  Lower abdomen dressing present. Normal bowel sounds heard. Extremities: No cyanosis; trace edema Central nervous system:  Slow to respond to questions.  Awake, answers very minimal questions.  No focal neurological deficits. Moving extremities Skin: No other rashes, lesions or ulcers Psychiatry: Could not be assessed because of patient being a poor historian..     Data Reviewed: I have personally reviewed following labs and imaging studies  CBC: Recent Labs  Lab 06/07/19 1208 06/08/19 1850 06/10/19 0440  WBC 15.1* 29.6* 22.5*  NEUTROABS 12.0* 27.2* 20.4*  HGB 10.6* 9.4* 7.3*  HCT 37.4 31.6* 24.0*  MCV 91.4 87.1 86.3  PLT 526* 521* 622   Basic Metabolic Panel: Recent Labs  Lab 06/07/19 1208 06/08/19 0600 06/08/19 1850 06/09/19 0500 06/10/19 0440  NA 131* 132* 132* 139 138  K 6.5* 4.0 3.6 3.3* 3.5  CL 101 105 107 115* 113*  CO2 16* 18* 18* 17* 17*  GLUCOSE 305* 158* 61* 84 117*  BUN 65* 62* 56* 46* 39*  CREATININE 2.24* 2.20* 1.67* 1.32* 1.05*  CALCIUM 7.9* 7.5* 7.2* 6.6* 7.0*  MG 1.5* 4.6*  --   --   --    GFR: Estimated Creatinine Clearance: 51.8 mL/min (A) (by C-G formula based on SCr of 1.05 mg/dL (H)). Liver Function Tests: Recent Labs  Lab 06/07/19 1208 06/08/19 1850 06/10/19 0440  AST 27 18 22   ALT 14 17 18   ALKPHOS 132* 131* 117  BILITOT 0.6 0.3 0.4  PROT 5.6* 5.1* 4.0*  ALBUMIN 2.0* 1.7* 1.3*   No results for input(s): LIPASE, AMYLASE in the last 168 hours. Recent Labs  Lab 06/08/19 1850  AMMONIA 29   Coagulation Profile: Recent Labs  Lab 06/08/19 1850  INR 2.2*   Cardiac Enzymes: No results for input(s): CKTOTAL, CKMB, CKMBINDEX, TROPONINI in the last 168 hours. BNP (last 3 results) No results for  input(s): PROBNP in the last 8760 hours. HbA1C: Recent Labs    06/11/19 0451  HGBA1C 7.4*   CBG: Recent Labs  Lab 06/09/19 1918 06/09/19 2346 06/10/19 0346 06/10/19 0822 06/10/19 1220  GLUCAP 106* 138* 121* 110* 110*   Lipid Profile: Recent Labs    06/11/19 0451  CHOL 53  HDL 11*  LDLCALC 25  TRIG 86  CHOLHDL 4.8   Thyroid Function Tests: Recent Labs    06/10/19 0440  TSH 0.456   Anemia Panel: Recent Labs    06/08/19 1850 06/09/19  0500 06/10/19 0400 06/10/19 0440  VITAMINB12  --   --  1,391*  --   FOLATE  --   --   --  5.1*  FERRITIN 181 175  --   --    Sepsis Labs: Recent Labs  Lab 06/08/19 1850  LATICACIDVEN 1.2    Recent Results (from the past 240 hour(s))  Culture, Urine     Status: Abnormal (Preliminary result)   Collection Time: 06/07/19  1:44 PM   Specimen: Urine, Clean Catch  Result Value Ref Range Status   Specimen Description   Final    URINE, CLEAN CATCH Performed at [facility], [address] [place], [place] [address]    Special Requests   Final    NONE Performed at [facility], [address], [place], [place] [address]    Culture (A)  Final    >=100,000 COLONIES/mL KLEBSIELLA PNEUMONIAE CULTURE REINCUBATED FOR BETTER GROWTH Performed at [facility], [place] [address], [place], [place] [address]    Report Status PENDING  Incomplete  SARS Coronavirus 2 by RT PCR (hospital order, performed in [facility] hospital lab)     Status: Abnormal   Collection Time: 06/08/19  2:10 PM  Result Value Ref Range Status   SARS Coronavirus 2 POSITIVE (A) NEGATIVE Final    Comment: RESULT CALLED TO, READ BACK BY AND VERIFIED WITH: JONES,V @1532  BY MATTHEWS, B 10.21.20 (NOTE) If result is NEGATIVE SARS-CoV-2 target nucleic acids are NOT DETECTED. The SARS-CoV-2 RNA is generally detectable in upper and lower  respiratory specimens during the acute phase of infection. The lowest  concentration of SARS-CoV-2 viral copies this assay can  detect is 250  copies / mL. A negative result does not preclude SARS-CoV-2 infection  and should not be used as the sole basis for treatment or other  patient management decisions.  A negative result may occur with  improper specimen collection / handling, submission of specimen other  than nasopharyngeal swab, presence of viral mutation(s) within the  areas targeted by this assay, and inadequate number of viral copies  (<250 copies / mL). A negative result must be combined with clinical  observations, patient history, and epidemiological information. If result is POSITIVE SARS-CoV-2 target nucleic acids are DETECTED.  The SARS-CoV-2 RNA is generally detectable in upper and lower  respiratory specimens during the acute phase of infection.  Positive  results are indicative of active infection with SARS-CoV-2.  Clinical  correlation with patient history and other diagnostic information is  necessary to determine patient infection status.  Positive results do  not rule out bacterial infection or co-infection with other viruses. If result is PRESUMPTIVE POSTIVE SARS-CoV-2 nucleic acids MAY BE PRESENT.   A presumptive positive result was obtained on the submitted specimen  and confirmed on repeat testing.  While 2019 novel coronavirus  (SARS-CoV-2) nucleic acids may be present in the submitted sample  additional confirmatory testing may be necessary for epidemiological  and / or clinical management purposes  to differentiate between  SARS-CoV-2 and other Sarbecovirus currently known to infect humans.  If clinically indicated additional testing with an alternate test  methodology [phone])  is advised. The SARS-CoV-2 RNA is generally  detectable in upper and lower respiratory specimens during the acute  phase of infection. The expected result is Negative. Fact Sheet for Patients:  StrictlyIdeas.no Fact Sheet for Healthcare  Providers: BankingDealers.co.za This test is not yet approved or cleared by the [place] FDA and has been authorized for  detection and/or diagnosis of SARS-CoV-2 by FDA under an Emergency Use Authorization (EUA).  This EUA will remain in effect (meaning this test can be used) for the duration of the COVID-19 declaration under Section 564(b)(1) of the Act, 21 U.S.C. section 360bbb-3(b)(1), unless the authorization is terminated or revoked sooner. Performed at [facility], [address], [place], [place] [address]   Blood culture (routine x 2)     Status: None (Preliminary result)   Collection Time: 06/08/19  6:50 PM   Specimen: BLOOD  Result Value Ref Range Status   Specimen Description BLOOD PICC LINE  Final   Special Requests   Final    Blood Culture adequate volume BOTTLES DRAWN AEROBIC AND ANAEROBIC   Culture   Final    NO GROWTH 3 DAYS Performed at [facility], [address] [place], [place] [address]    Report Status PENDING  Incomplete  Urine culture     Status: Abnormal (Preliminary result)   Collection Time: 06/08/19  6:50 PM   Specimen: Urine, Random  Result Value Ref Range Status   Specimen Description   Final    URINE, RANDOM Performed at [facility], [address] [place], [place] [address]    Special Requests   Final    NONE Performed at [facility], [address], [place], [place] [address]    Culture (A)  Final    70,000 COLONIES/mL KLEBSIELLA PNEUMONIAE SUSCEPTIBILITIES TO FOLLOW Performed at [facility], [place] [address] [place], [place] [address]    Report Status PENDING  Incomplete  Blood culture (routine x 2)     Status: None (Preliminary result)   Collection Time: 06/08/19  7:02 PM   Specimen: BLOOD  Result Value Ref Range Status   Specimen Description BLOOD PICC LINE  Final   Special Requests Blood Culture adequate volume  Final   Culture   Final    NO GROWTH 3 DAYS Performed at [facility], [address], [place], [place] [address]    Report Status PENDING  Incomplete         Radiology Studies: Dg Chest Port 1v Same Day  Result Date: 06/10/2019 CLINICAL DATA:  Shortness of breath EXAM: PORTABLE CHEST 1 VIEW COMPARISON:  06/08/2019 FINDINGS: Heart is normal size. No confluent airspace opacities or effusions. No acute bony abnormality. IMPRESSION: No acute cardiopulmonary disease. Electronically Signed   By: Rolm Baptise M.D.   On: 06/10/2019 08:40   Ct Head Code Stroke Wo Contrast  Result Date: 06/10/2019 CLINICAL DATA:  Code stroke. Subacute neuro deficits. Slurred speech EXAM: CT HEAD WITHOUT CONTRAST TECHNIQUE: Contiguous axial images were obtained from the base of the skull through the vertex without intravenous contrast. COMPARISON:  05/17/2019 FINDINGS: Brain: No evidence of acute infarction, hemorrhage, hydrocephalus, extra-axial collection or mass lesion/mass effect. 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 No hyperdense vessel. Skull: Normal. Negative for fracture or focal lesion. Sinuses/Orbits: No acute finding. Other: These results were communicated to [place] [facility] [place] 10/23/2020by text page via the [facility] messaging system. ASPECTS [facility] Stroke Program Early CT Score) - Ganglionic level infarction (caudate, lentiform nuclei, internal capsule, insula, M1-M3 cortex): 7 - Supraganglionic infarction (M4-M6 cortex): 3 Total score (0-10 with 10 being normal): 10 IMPRESSION: 1. No acute or interval finding. ASPECTS is 10. 2. Atherosclerotic calcification. Electronically Signed   By: Monte Fantasia M.D.   On: 06/10/2019 09:00   Vas US Carotid (at  [facility] Only)  Result Date: 06/10/2019 Carotid Arterial Duplex Study Indications:       CVA. Limitations        Today's exam was limited due to lighting in the room. Comparison Study:  No prior. Performing Technologist: Oda Cogan RDMS, RVT   Examination Guidelines: A complete evaluation includes B-mode imaging, spectral Doppler, color Doppler, and power Doppler as needed of all accessible portions of each vessel. Bilateral testing is considered an integral part of a complete examination. Limited examinations for reoccurring indications may be performed as noted.  Right Carotid Findings: +----------+--------+--------+--------+------------------+--------+             PSV cm/s EDV cm/s Stenosis Plaque Description Comments  +----------+--------+--------+--------+------------------+--------+  CCA Prox   85       19                                             +----------+--------+--------+--------+------------------+--------+  CCA Distal 86       17                                             +----------+--------+--------+--------+------------------+--------+  ICA Prox   50       20       1-39%    heterogenous                 +----------+--------+--------+--------+------------------+--------+  ICA Distal 57       17                                             +----------+--------+--------+--------+------------------+--------+  ECA        73       11                                             +----------+--------+--------+--------+------------------+--------+ +----------+--------+-------+------------+-------------------+             PSV cm/s EDV cms Describe     Arm Pressure (mmHG)  +----------+--------+-------+------------+-------------------+  Subclavian                  Not assessed                      +----------+--------+-------+------------+-------------------+ +---------+--------+--------+------------+  Vertebral PSV cm/s EDV cm/s Not assessed  +---------+--------+--------+------------+  Left Carotid Findings: +----------+--------+--------+--------+------------------+--------+             PSV cm/s EDV cm/s Stenosis Plaque Description Comments  +----------+--------+--------+--------+------------------+--------+  CCA Prox   98                                                       +----------+--------+--------+--------+------------------+--------+  CCA Distal 65                                                      +----------+--------+--------+--------+------------------+--------+  ICA Prox   68       15       1-39%    heterogenous                 +----------+--------+--------+--------+------------------+--------+  ICA Distal 81       24                                             +----------+--------+--------+--------+------------------+--------+  ECA        71       7                                              +----------+--------+--------+--------+------------------+--------+ +----------+--------+--------+--------------+-------------------+             PSV cm/s EDV cm/s Describe       Arm Pressure (mmHG)  +----------+--------+--------+--------------+-------------------+  Subclavian                   Not identified                      +----------+--------+--------+--------------+-------------------+ +---------+--------+--------+------------+  Vertebral PSV cm/s EDV cm/s Not assessed  +---------+--------+--------+------------+  Summary: Right Carotid: Velocities in the right ICA are consistent with a 1-39% stenosis. Left Carotid: Velocities in the left ICA are consistent with a 1-39% stenosis. Vertebrals: Bilateral vertebral arteries were not visualized. *See table(s) above for measurements and observations.     Preliminary         Scheduled Meds:  apixaban  5 mg Oral BID   Chlorhexidine Gluconate Cloth  6 each Topical Daily   feeding supplement (PRO-STAT SUGAR FREE 64)  30 mL Oral TID   metoprolol tartrate  50 mg Oral BID   umeclidinium bromide  1 puff Inhalation Daily   vitamin C  1,000 mg Oral Daily   zinc sulfate  220 mg Oral Daily   Continuous Infusions:  meropenem (MERREM) IV Stopped (06/11/19 0700)          Aline August, MD [facility] 06/11/2019, 7:40 AM

## 2019-06-12 DIAGNOSIS — N390 Urinary tract infection, site not specified: Secondary | ICD-10-CM

## 2019-06-12 LAB — COMPREHENSIVE METABOLIC PANEL
ALT: 23 U/L (ref 0–44)
AST: 31 U/L (ref 15–41)
Albumin: 1.2 g/dL — ABNORMAL LOW (ref 3.5–5.0)
Alkaline Phosphatase: 128 U/L — ABNORMAL HIGH (ref 38–126)
Anion gap: 6 (ref 5–15)
BUN: 24 mg/dL — ABNORMAL HIGH (ref 8–23)
CO2: 21 mmol/L — ABNORMAL LOW (ref 22–32)
Calcium: 7.6 mg/dL — ABNORMAL LOW (ref 8.9–10.3)
Chloride: 113 mmol/L — ABNORMAL HIGH (ref 98–111)
Creatinine, Ser: 0.79 mg/dL (ref 0.44–1.00)
GFR calc Af Amer: >60 mL/min (ref >60)
GFR calc non Af Amer: >60 mL/min (ref >60)
Glucose, Bld: 225 mg/dL — ABNORMAL HIGH (ref 70–99)
Potassium: 4.1 mmol/L (ref 3.5–5.1)
Sodium: 140 mmol/L (ref 135–145)
Total Bilirubin: 0.4 mg/dL (ref 0.3–1.2)
Total Protein: 4.2 g/dL — ABNORMAL LOW (ref 6.5–8.1)

## 2019-06-12 LAB — CBC WITH DIFFERENTIAL/PLATELET
Abs Immature Granulocytes: 0.12 10*3/uL — ABNORMAL HIGH (ref 0.00–0.07)
Basophils Absolute: 0 10*3/uL (ref 0.0–0.1)
Basophils Relative: 0 %
Eosinophils Absolute: 0.1 10*3/uL (ref 0.0–0.5)
Eosinophils Relative: 1 %
HCT: 24.1 % — ABNORMAL LOW (ref 36.0–46.0)
Hemoglobin: 7.4 g/dL — ABNORMAL LOW (ref 12.0–15.0)
Immature Granulocytes: 1 %
Lymphocytes Relative: 12 %
Lymphs Abs: 1.2 10*3/uL (ref 0.7–4.0)
MCH: 26.1 pg (ref 26.0–34.0)
MCHC: 30.7 g/dL (ref 30.0–36.0)
MCV: 85.2 fL (ref 80.0–100.0)
Monocytes Absolute: 0.6 10*3/uL (ref 0.1–1.0)
Monocytes Relative: 5 %
Neutro Abs: 8.2 10*3/uL — ABNORMAL HIGH (ref 1.7–7.7)
Neutrophils Relative %: 81 %
Platelets: 275 10*3/uL (ref 150–400)
RBC: 2.83 MIL/uL — ABNORMAL LOW (ref 3.87–5.11)
RDW: 18.9 % — ABNORMAL HIGH (ref 11.5–15.5)
WBC: 10.3 10*3/uL (ref 4.0–10.5)
nRBC: 0 % (ref 0.0–0.2)

## 2019-06-12 LAB — GLUCOSE, CAPILLARY
Glucose-Capillary: 185 mg/dL — ABNORMAL HIGH (ref 70–99)
Glucose-Capillary: 179 mg/dL — ABNORMAL HIGH (ref 70–99)
Glucose-Capillary: 212 mg/dL — ABNORMAL HIGH (ref 70–99)
Glucose-Capillary: 209 mg/dL — ABNORMAL HIGH (ref 70–99)
Glucose-Capillary: 192 mg/dL — ABNORMAL HIGH (ref 70–99)
Glucose-Capillary: 184 mg/dL — ABNORMAL HIGH (ref 70–99)

## 2019-06-12 LAB — FERRITIN: Ferritin: 258 ng/mL (ref 11–307)

## 2019-06-12 LAB — MAGNESIUM: Magnesium: 1.8 mg/dL (ref 1.7–2.4)

## 2019-06-12 LAB — LACTATE DEHYDROGENASE: LDH: 274 U/L — ABNORMAL HIGH (ref 98–192)

## 2019-06-12 LAB — C-REACTIVE PROTEIN: CRP: 15.8 mg/dL — ABNORMAL HIGH (ref <1.0)

## 2019-06-12 MED ORDER — INSULIN GLARGINE 100 UNIT/ML ~~LOC~~ SOLN
5.0000 [IU] | Freq: Every day | SUBCUTANEOUS | Status: DC
  Administered 2019-06-12 – 2019-06-13 (×2): 5 [IU] via SUBCUTANEOUS
  Filled 2019-06-12 (×4): qty 0.05

## 2019-06-12 MED ORDER — METOPROLOL TARTRATE 5 MG/5ML IV SOLN
2.5000 mg | Freq: Four times a day (QID) | INTRAVENOUS | Status: DC | PRN
  Administered 2019-06-16: 2.5 mg via INTRAVENOUS
  Filled 2019-06-12: qty 5

## 2019-06-12 MED ORDER — FOLIC ACID 1 MG PO TABS
1.0000 mg | ORAL_TABLET | Freq: Every day | ORAL | Status: DC
  Administered 2019-06-12 – 2019-06-17 (×6): 1 mg via ORAL
  Filled 2019-06-12 (×6): qty 1

## 2019-06-12 NOTE — Progress Notes (Addendum)
Patient ID: [name], female   DOB: Mar 03, 1951, 68 y.o.   MRN: [id number]  PROGRESS NOTE    [name]  HCW:[id number] DOB: 14-Mar-1951 DOA: 06/08/2019 PCP: [name], MD   Brief Narrative:  68 year old female with history of pyoderma gangrenosum with chronic abdominal wound followed by surgeon Dr. Blake Divine in [place], MRSA infection, chronic diastolic CHF, atrial fibrillation, COPD, CKD stage III, hypertension presented from rehab facility on 06/08/2019 with altered mental status and hypoglycemia.  She tested positive for COVID-19 on 06/07/2019 and has been complaining of mild cough with mild difficulty breathing and sore throat.  She has a chronic draining wound to her lower abdomen from pyoderma gangrenosum.  She had a recent hospitalization from 05/09/2019-05/25/2019 at [facility] for episodes of hypoglycemia and encephalopathy which required neurology evaluation and EEG which showed no evidence of seizures and she was discharged to rehab facility.  Prior to that, she was discharged on 04/30/2023 on IV Invanz for ESBL bacteremia secondary to infected abdominal wound.  She completed 3 weeks of IV Invanz which was discontinued on 05/21/2019.  Prior to that, she has had chronic abdominal wound recently secondary to diagnosis of pyoderma gangrenosum and also was treated with steroids.  On presentation to the ED on 06/08/2019, she was found to be tachycardic with leukocytosis of 29 and elevated creatinine.  Chest x-ray was negative for acute abnormality.  She was started on broad-spectrum antibiotic.  CT of the abdomen and pelvis was ordered.  She was transferred to [facility] because of COVID-19 being positive.  General surgery did not recommend any surgical intervention.  She was transferred to [place] on 06/09/2019.  On 08/10/2019, she had some slurred speech and question of facial droop.  CT of the brain was negative for acute abnormality.  Neurology recommended  MRI of the brain and transfer to [facility].  She was transferred to [facility] on 06/10/2019.  Assessment & Plan:  Slurring of speech/right facial droop with dysarthria:  -She was found to be dysarthric with slight right-sided facial droop on 06/10/2019.  CT of the head was negative for acute abnormality.  Neurology recommended MRI and transfer to [place] brain was negative for acute infarct. -Echo showed EF of 65 to 70%.-Ultrasound carotid showed bilateral ICA 1 to 39% stenosis -PT/OT/SLP evaluation -LDL 25.  A1c 7.4 -Continue Eliquis. -Neurology has signed off.   Metabolic encephalopathy -Most likely secondary to UTI/COVID-19 infection/hypoglycemia -Monitor mental status.   -Fall precautions. -During her last hospitalization from 05/09/2019-05/25/2019 at [facility], she had encephalopathy for which neurology had evaluated the patient and she had undergone EEG which was negative for seizures.  COVID-19 infection -Reports mild cough with some difficulty breathing and sore throat -Chest x-ray was negative for acute abnormality. -Currently on room air.  Recent Labs    06/10/19 0400 06/11/19 0834 06/12/19 0549  FERRITIN  --  227 258  LDH  --  258* 274*  CRP 14.5* 19.0* 15.8*    Lab Results  Component Value Date   SARSCOV2NAA POSITIVE (A) 06/08/2019   [place] NEGATIVE 05/09/2019   [place] NEGATIVE 04/27/2019   [place] NEGATIVE 04/23/2019   -Monitor inflammatory markers -We will hold off on remdesivir and Solu-Medrol for now. -Continue vitamin C and zinc. -We will transfer patient back to [facility]  Chronic abdominal wound in a patient with history of pyoderma gangrenosum -CT of the abdomen and pelvis did not show any evidence of abscess or  drainable collection -Patient was empirically started on broad-spectrum antibiotics on presentation.  Patient had recent treatment with 3 weeks of IV Invanz for her abdominal wound infection which  was discontinued on 05/21/2019.  She follows up with Dr. Mendel Ryder Bridges/surgeon as an outpatient. -General surgery evaluated the patient and recommended no surgical intervention.  Wound care as per general surgery recommendations.  Leukocytosis -Antibiotic plan as below.  Resolved.  Will monitor.  Hypokalemia -Improved.  Probable UTI  -Urine culture is growing Klebsiella.  Follow sensitivities.  Continue meropenem for now  Acute kidney injury on chronic any disease stage III -Creatinine baseline 0.8-1.3. -Creatinine was 2.24 on 06/07/2019.  Resolved.  Monitor creatinine.  Of IV fluids.  Diabetes mellitus type 2 with hypoglycemia and hyperglycemia -Lantus and NovoLog held.  Blood sugars on the higher side.  Will resume Lantus.  Continue monitoring CBGs.  Hypoalbuminemia -Probably because of very poor oral intake.  Nutrition eval.  Chronic diastolic CHF -Compensated.  Last echo in 12/2018 showed EF of 50 to 53% with LV diastolic parameters consistent with pseudonormalization.  Not on diuretics.  Chronic atrial fibrillation -Currently rate controlled.  Continue Eliquis and Metoprolol. Cardizem on hold because of low blood pressure.  Anemia of chronic disease -Questionable cause.  Monitor hemoglobin.  No evidence of any bleeding.  Generalized deconditioning -PT recommends SNF placement.  Consult social worker  DVT prophylaxis:  Eliquis Code Status: Full Family Communication: Spoke with husband/John Sessums on phone on 06/12/2019 Disposition Plan:SNF placement.  Consultants: General surgery  Procedures:  Echo 1. Left ventricular ejection fraction, by visual estimation, is 65 to 70%. The left ventricle has normal function. Normal left ventricular size. Left ventricular septal wall thickness was mildly increased. Mildly increased left ventricular posterior  wall thickness.  2. Global right ventricle has normal systolic function.The right ventricular size is normal. No increase in  right ventricular wall thickness.  3. Left atrial size was normal.  4. Right atrial size was normal.  5. The mitral valve is degenerative. Moderate mitral annular calcification. There is mild thickening of the mitral valve leaflet(s). There is mild calcification of the mitral valve leaflet(s). No evidence of mitral valve regurgitation. Mild mitral  stenosis.  6. The tricuspid valve is normal in structure. Tricuspid valve regurgitation is mild.  7. The aortic valve is normal in structure. Aortic valve regurgitation was not visualized by color flow Doppler. Structurally normal aortic valve, with no evidence of sclerosis or stenosis.  8. The pulmonic valve was normal in structure. Pulmonic valve regurgitation is trivial by color flow Doppler.  9. The inferior vena cava is normal in size with greater than 50% respiratory variability, suggesting right atrial pressure of 3 mmHg.  Antimicrobials:  Anti-infectives (From admission, onward)   Start     Dose/Rate Route Frequency Ordered Stop   06/10/19 2200  vancomycin (VANCOCIN) 1,500 mg in sodium chloride 0.9 % 500 mL IVPB  Status:  Discontinued     1,500 mg 250 mL/hr over 120 Minutes Intravenous Every 48 hours 06/08/19 2007 06/10/19 1057   06/10/19 1000  meropenem (MERREM) 1 g in sodium chloride 0.9 % 100 mL IVPB     1 g 200 mL/hr over 30 Minutes Intravenous Every 8 hours 06/10/19 0848     06/09/19 1000  meropenem (MERREM) 2 g in sodium chloride 0.9 % 100 mL IVPB  Status:  Discontinued     2 g 200 mL/hr over 30 Minutes Intravenous Every 12 hours 06/09/19 0855 06/10/19 0848   06/08/19 2100  ertapenem (INVANZ) 1,000 mg in sodium chloride 0.9 % 100 mL IVPB  Status:  Discontinued     1 g 200 mL/hr over 30 Minutes Intravenous Every 24 hours 06/08/19 2007 06/09/19 0855   06/08/19 1945  vancomycin (VANCOCIN) IVPB 1000 mg/200 mL premix  Status:  Discontinued     1,000 mg 200 mL/hr over 60 Minutes Intravenous  Once 06/08/19 1936 06/08/19 1942   06/08/19  1945  ceFEPIme (MAXIPIME) 2 g in sodium chloride 0.9 % 100 mL IVPB  Status:  Discontinued     2 g 200 mL/hr over 30 Minutes Intravenous  Once 06/08/19 1936 06/08/19 2135   06/08/19 1945  vancomycin (VANCOCIN) 1,500 mg in sodium chloride 0.9 % 500 mL IVPB     1,500 mg 250 mL/hr over 120 Minutes Intravenous  Once 06/08/19 1942 06/09/19 0017      Subjective: Patient seen and examined at bedside.  Poor historian.  More awake today and answers some questions appropriately, still slow to respond.  No overnight fever or vomiting reported  objective: Vitals:   06/11/19 1956 06/12/19 0300 06/12/19 0729 06/12/19 0731  BP: 127/69  (!) 120/47 (!) 108/56  Pulse: 80  72   Resp: 18  19   Temp: 99 F (37.2 C)  98.6 F (37 C)   TempSrc: Oral  Axillary   SpO2: 94%     Weight:  82.9 kg    Height:        Intake/Output Summary (Last 24 hours) at 06/12/2019 0736 Last data filed at 06/11/2019 1726 Gross per 24 hour  Intake 418.69 ml  Output 500 ml  Net -81.31 ml   Filed Weights   06/08/19 1838 06/12/19 0300  Weight: 78 kg 82.9 kg    Examination:  General exam: No distress.  Looks chronically ill. Very poor historian Respiratory system: Bilateral decreased breath sounds at bases, no wheezing cardiovascular system: S1-S2 heard, intermittently tachycardic Gastrointestinal system: Abdomen is nondistended, soft and mildly tender in the lower abdomen.  Lower abdomen dressing present. Normal bowel sounds heard. Extremities: No cyanosis; trace edema Central nervous system: Still slow to respond to questions.  More awake today, answers a little more questions appropriately.  No focal neurological deficits. Moving extremities Skin: No other rashes, lesions or ulcers Psychiatry: Could not be assessed because of patient being a poor historian..     Data Reviewed: I have personally reviewed following labs and imaging studies  CBC: Recent Labs  Lab 06/07/19 1208 06/08/19 1850 06/10/19 0440  06/11/19 0834 06/12/19 0549  WBC 15.1* 29.6* 22.5* 15.3* 10.3  NEUTROABS 12.0* 27.2* 20.4* 13.6* 8.2*  HGB 10.6* 9.4* 7.3* 7.5* 7.4*  HCT 37.4 31.6* 24.0* 24.5* 24.1*  MCV 91.4 87.1 86.3 86.3 85.2  PLT 526* 521* 329 294 347   Basic Metabolic Panel: Recent Labs  Lab 06/07/19 1208 06/08/19 0600 06/08/19 1850 06/09/19 0500 06/10/19 0440 06/11/19 0834 06/12/19 0549  NA 131* 132* 132* 139 138 140 140  K 6.5* 4.0 3.6 3.3* 3.5 3.3* 4.1  CL 101 105 107 115* 113* 113* 113*  CO2 16* 18* 18* 17* 17* 19* 21*  GLUCOSE 305* 158* 61* 84 117* 289* 225*  BUN 65* 62* 56* 46* 39* 30* 24*  CREATININE 2.24* 2.20* 1.67* 1.32* 1.05* 0.83 0.79  CALCIUM 7.9* 7.5* 7.2* 6.6* 7.0* 7.5* 7.6*  MG 1.5* 4.6*  --   --   --  2.0 1.8   GFR: Estimated Creatinine Clearance: 70.1 mL/min (by C-G formula based on SCr  of 0.79 mg/dL). Liver Function Tests: Recent Labs  Lab 06/07/19 1208 06/08/19 1850 06/10/19 0440 06/11/19 0834 06/12/19 0549  AST 27 18 22 22 31   ALT 14 17 18 20 23   ALKPHOS 132* 131* 117 123 128*  BILITOT 0.6 0.3 0.4 0.4 0.4  PROT 5.6* 5.1* 4.0* 4.2* 4.2*  ALBUMIN 2.0* 1.7* 1.3* 1.2* 1.2*   No results for input(s): LIPASE, AMYLASE in the last 168 hours. Recent Labs  Lab 06/08/19 1850  AMMONIA 29   Coagulation Profile: Recent Labs  Lab 06/08/19 1850  INR 2.2*   Cardiac Enzymes: No results for input(s): CKTOTAL, CKMB, CKMBINDEX, TROPONINI in the last 168 hours. BNP (last 3 results) No results for input(s): PROBNP in the last 8760 hours. HbA1C: Recent Labs    06/11/19 0451  HGBA1C 7.4*   CBG: Recent Labs  Lab 06/11/19 1233 06/11/19 1655 06/11/19 1952 06/12/19 0112 06/12/19 0442  GLUCAP 157* 245* 222* 185* 179*   Lipid Profile: Recent Labs    06/11/19 0451  CHOL 53  HDL 11*  LDLCALC 25  TRIG 86  CHOLHDL 4.8   Thyroid Function Tests: Recent Labs    06/10/19 0440  TSH 0.456   Anemia Panel: Recent Labs    06/10/19 0400 06/10/19 0440 06/11/19 0834  06/12/19 0549  VITAMINB12 1,391*  --   --   --   FOLATE  --  5.1*  --   --   FERRITIN  --   --  227 258   Sepsis Labs: Recent Labs  Lab 06/08/19 1850  LATICACIDVEN 1.2    Recent Results (from the past 240 hour(s))  Culture, Urine     Status: Abnormal   Collection Time: 06/07/19  1:44 PM   Specimen: Urine, Clean Catch  Result Value Ref Range Status   Specimen Description   Final    URINE, CLEAN CATCH Performed at [facility], [address], [place], [place] [address]    Special Requests   Final    NONE Performed at [facility], [address], [place], [place] [address]    Culture (A)  Final    >=100,000 COLONIES/mL KLEBSIELLA PNEUMONIAE Confirmed Extended Spectrum Beta-Lactamase Producer (ESBL).  In bloodstream infections from ESBL organisms, carbapenems are preferred over piperacillin/tazobactam. They are shown to have a lower risk of mortality.    Report Status 06/11/2019 FINAL  Final   Organism ID, Bacteria KLEBSIELLA PNEUMONIAE (A)  Final      Susceptibility   Klebsiella pneumoniae - MIC*    AMPICILLIN >=32 RESISTANT Resistant     CEFAZOLIN >=64 RESISTANT Resistant     CEFTRIAXONE >=64 RESISTANT Resistant     CIPROFLOXACIN >=4 RESISTANT Resistant     GENTAMICIN >=16 RESISTANT Resistant     IMIPENEM <=0.25 SENSITIVE Sensitive     NITROFURANTOIN 256 RESISTANT Resistant     TRIMETH/SULFA 40 SENSITIVE Sensitive     AMPICILLIN/SULBACTAM >=32 RESISTANT Resistant     PIP/TAZO >=128 RESISTANT Resistant     Extended ESBL POSITIVE Resistant     * >=100,000 COLONIES/mL KLEBSIELLA PNEUMONIAE  SARS Coronavirus 2 by RT PCR (hospital order, performed in [place] hospital lab)     Status: Abnormal   Collection Time: 06/08/19  2:10 PM  Result Value Ref Range Status   SARS Coronavirus 2 POSITIVE (A) NEGATIVE Final    Comment: RESULT CALLED TO, READ BACK BY AND VERIFIED WITH: JONES,V @1532  BY MATTHEWS, B 10.21.20 (NOTE) If result is NEGATIVE SARS-CoV-2 target nucleic acids  are NOT DETECTED.  The SARS-CoV-2 RNA is generally detectable in upper and lower  respiratory specimens during the acute phase of infection. The lowest  concentration of SARS-CoV-2 viral copies this assay can detect is 250  copies / mL. A negative result does not preclude SARS-CoV-2 infection  and should not be used as the sole basis for treatment or other  patient management decisions.  A negative result may occur with  improper specimen collection / handling, submission of specimen other  than nasopharyngeal swab, presence of viral mutation(s) within the  areas targeted by this assay, and inadequate number of viral copies  (<250 copies / mL). A negative result must be combined with clinical  observations, patient history, and epidemiological information. If result is POSITIVE SARS-CoV-2 target nucleic acids are DETECTED.  The SARS-CoV-2 RNA is generally detectable in upper and lower  respiratory specimens during the acute phase of infection.  Positive  results are indicative of active infection with SARS-CoV-2.  Clinical  correlation with patient history and other diagnostic information is  necessary to determine patient infection status.  Positive results do  not rule out bacterial infection or co-infection with other viruses. If result is PRESUMPTIVE POSTIVE SARS-CoV-2 nucleic acids MAY BE PRESENT.   A presumptive positive result was obtained on the submitted specimen  and confirmed on repeat testing.  While 2019 novel coronavirus  (SARS-CoV-2) nucleic acids may be present in the submitted sample  additional confirmatory testing may be necessary for epidemiological  and / or clinical management purposes  to differentiate between  SARS-CoV-2 and other Sarbecovirus currently known to infect humans.  If clinically indicated additional testing with an alternate test  methodology [phone])  is advised. The SARS-CoV-2 RNA is generally  detectable in upper and lower respiratory specimens  during the acute  phase of infection. The expected result is Negative. Fact Sheet for Patients:  StrictlyIdeas.no Fact Sheet for Healthcare Providers: BankingDealers.co.za This test is not yet approved or cleared by the [place] FDA and has been authorized for detection and/or diagnosis of SARS-CoV-2 by FDA under an Emergency Use Authorization (EUA).  This EUA will remain in effect (meaning this test can be used) for the duration of the COVID-19 declaration under Section 564(b)(1) of the Act, 21 U.S.C. section 360bbb-3(b)(1), unless the authorization is terminated or revoked sooner. Performed at [facility], [address] [place], [place] [address]   Blood culture (routine x 2)     Status: None (Preliminary result)   Collection Time: 06/08/19  6:50 PM   Specimen: BLOOD  Result Value Ref Range Status   Specimen Description BLOOD PICC LINE  Final   Special Requests   Final    Blood Culture adequate volume BOTTLES DRAWN AEROBIC AND ANAEROBIC   Culture   Final    NO GROWTH 3 DAYS Performed at [facility], [address], [place], [place] [address]    Report Status PENDING  Incomplete  Urine culture     Status: Abnormal (Preliminary result)   Collection Time: 06/08/19  6:50 PM   Specimen: Urine, Random  Result Value Ref Range Status   Specimen Description   Final    URINE, RANDOM Performed at [facility], [address], [place], [place] [address]    Special Requests   Final    NONE Performed at [facility], [address], [place],  [address]    Culture (A)  Final    70,000 COLONIES/mL KLEBSIELLA PNEUMONIAE REPEATING SUSCEPTIBILITIES TO FOLLOW Performed at [facility], [address] [place], [place] [address]    Report Status PENDING  Incomplete  Blood culture (routine x 2)     Status: None (Preliminary result)   Collection Time: 06/08/19  7:02 PM   Specimen: BLOOD  Result Value Ref Range Status    Specimen Description BLOOD PICC LINE  Final   Special Requests Blood Culture adequate volume  Final   Culture   Final    NO GROWTH 3 DAYS Performed at [facility], [address], [place], [place] [address]    Report Status PENDING  Incomplete         Radiology Studies: Dg Chest Port 1v Same Day  Result Date: 06/10/2019 CLINICAL DATA:  Shortness of breath EXAM: PORTABLE CHEST 1 VIEW COMPARISON:  06/08/2019 FINDINGS: Heart is normal size. No confluent airspace opacities or effusions. No acute bony abnormality. IMPRESSION: No acute cardiopulmonary disease. Electronically Signed   By: Rolm Baptise M.D.   On: 06/10/2019 08:40   Ct Head Code Stroke Wo Contrast  Result Date: 06/10/2019 CLINICAL DATA:  Code stroke. Subacute neuro deficits. Slurred speech EXAM: CT HEAD WITHOUT CONTRAST TECHNIQUE: Contiguous axial images were obtained from the base of the skull through the vertex without intravenous contrast. COMPARISON:  05/17/2019 FINDINGS: Brain: No evidence of acute infarction, hemorrhage, hydrocephalus, extra-axial collection or mass lesion/mass effect. 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 No hyperdense vessel. Skull: Normal. Negative for fracture or focal lesion. Sinuses/Orbits: No acute finding. Other: These results were communicated to [place] [facility] [place] 10/23/2020by text page via the [facility] messaging system. ASPECTS [facility] Stroke Program Early CT Score) - Ganglionic level infarction (caudate, lentiform nuclei, internal capsule, insula, M1-M3 cortex): 7 - Supraganglionic infarction (M4-M6 cortex): 3 Total score (0-10 with 10 being normal): 10 IMPRESSION: 1. No acute or interval finding. ASPECTS is 10. 2. Atherosclerotic calcification. Electronically Signed   By: Monte Fantasia M.D.   On: 06/10/2019 09:00   Vas US Carotid (at [place] Only)  Result Date: 06/10/2019 Carotid Arterial Duplex Study  Indications:       CVA. Limitations        Today's exam was limited due to lighting in the room. Comparison Study:  No prior. Performing Technologist: Oda Cogan RDMS, RVT  Examination Guidelines: A complete evaluation includes B-mode imaging, spectral Doppler, color Doppler, and power Doppler as needed of all accessible portions of each vessel. Bilateral testing is considered an integral part of a complete examination. Limited examinations for reoccurring indications may be performed as noted.  Right Carotid Findings: +----------+--------+--------+--------+------------------+--------+             PSV cm/s EDV cm/s Stenosis Plaque Description Comments  +----------+--------+--------+--------+------------------+--------+  CCA Prox   85       19                                             +----------+--------+--------+--------+------------------+--------+  CCA Distal 86       17                                             +----------+--------+--------+--------+------------------+--------+  ICA Prox   50       20  1-39%    heterogenous                 +----------+--------+--------+--------+------------------+--------+  ICA Distal 57       17                                             +----------+--------+--------+--------+------------------+--------+  ECA        73       11                                             +----------+--------+--------+--------+------------------+--------+ +----------+--------+-------+------------+-------------------+             PSV cm/s EDV cms Describe     Arm Pressure (mmHG)  +----------+--------+-------+------------+-------------------+  Subclavian                  Not assessed                      +----------+--------+-------+------------+-------------------+ +---------+--------+--------+------------+  Vertebral PSV cm/s EDV cm/s Not assessed  +---------+--------+--------+------------+  Left Carotid Findings: +----------+--------+--------+--------+------------------+--------+              PSV cm/s EDV cm/s Stenosis Plaque Description Comments  +----------+--------+--------+--------+------------------+--------+  CCA Prox   98                                                      +----------+--------+--------+--------+------------------+--------+  CCA Distal 65                                                      +----------+--------+--------+--------+------------------+--------+  ICA Prox   68       15       1-39%    heterogenous                 +----------+--------+--------+--------+------------------+--------+  ICA Distal 81       24                                             +----------+--------+--------+--------+------------------+--------+  ECA        71       7                                              +----------+--------+--------+--------+------------------+--------+ +----------+--------+--------+--------------+-------------------+             PSV cm/s EDV cm/s Describe       Arm Pressure (mmHG)  +----------+--------+--------+--------------+-------------------+  Subclavian                   Not identified                      +----------+--------+--------+--------------+-------------------+ +---------+--------+--------+------------+  Vertebral PSV cm/s EDV cm/s Not assessed  +---------+--------+--------+------------+  Summary: Right Carotid: Velocities in the right ICA are consistent with a 1-39% stenosis. Left Carotid: Velocities in the left ICA are consistent with a 1-39% stenosis. Vertebrals: Bilateral vertebral arteries were not visualized. *See table(s) above for measurements and observations.     Preliminary    Mr Brain Wo Contrast (charm Study)  Result Date: 06/11/2019 CLINICAL DATA:  Focal neuro deficit. COVID-19 positive. Slurred speech with right-sided weakness. EXAM: MRI HEAD WITHOUT CONTRAST TECHNIQUE: Multiplanar, multiecho pulse sequences of the brain and surrounding structures were obtained without intravenous contrast. COMPARISON:  CT head 06/10/2019  FINDINGS: Brain: Negative for acute infarct Generalized atrophy. Chronic microvascular ischemic changes in the white matter and pons. Negative for hemorrhage or mass. No midline shift. Vascular: Normal arterial flow voids Skull and upper cervical spine: Negative Sinuses/Orbits: Negative Other: None IMPRESSION: Negative for acute infarct Atrophy and moderate chronic microvascular ischemia. Electronically Signed   By: Franchot Gallo M.D.   On: 06/11/2019 11:52        Scheduled Meds:  apixaban  5 mg Oral BID   Chlorhexidine Gluconate Cloth  6 each Topical Daily   feeding supplement (PRO-STAT SUGAR FREE 64)  30 mL Oral TID   folic acid  1 mg Intravenous Daily   metoprolol tartrate  50 mg Oral BID   umeclidinium bromide  1 puff Inhalation Daily   vitamin C  1,000 mg Oral Daily   zinc sulfate  220 mg Oral Daily   Continuous Infusions:  meropenem (MERREM) IV 1 g (06/12/19 0726)          Aline August, MD [facility] 06/12/2019, 7:36 AM

## 2019-06-12 NOTE — TOC Progression Note (Signed)
Transition of Care [facility]) - Progression Note    Patient Details  Name: [name] MRN: [id number] Date of Birth: Oct 10, 1950  Transition of Care [facility]) CM/SW [place], [place] Phone Number: 06/12/2019, 12:41 PM  Clinical Narrative:     CSW acknowledges consult for SNF. The patient has been worked up to return to [facility]. They have started insurance authorization.   CSW will continue to follow and continue to assist with discharge planning.   Expected Discharge Plan: Skilled Nursing Facility Barriers to Discharge: Ship broker, Continued Medical Work up  Expected Discharge Plan and Services Expected Discharge Plan: [place] In-house Referral: Clinical Social Work                       DME Arranged: N/A         HH Arranged: NA           Social Determinants of Health (SDOH) Interventions    Readmission Risk Interventions Readmission Risk Prevention Plan 05/10/2019 04/30/2019 04/29/2019  Transportation Screening Complete Complete Complete  Medication Review Press photographer) Complete Complete Complete  PCP or Specialist appointment within 3-5 days of discharge Complete Complete Not Complete  PCP/Specialist Appt Not Complete comments Going tomorrow after discharge. - -  HRI or Home Care Consult Complete Complete Complete  SW Recovery Care/Counseling Consult Complete Patient refused Complete  Palliative Care Screening - Not Applicable Not Complete  Skilled Nursing Facility Not Complete Not Applicable Not Complete  Some recent data might be hidden

## 2019-06-13 LAB — C-REACTIVE PROTEIN: CRP: 11.3 mg/dL — ABNORMAL HIGH (ref <1.0)

## 2019-06-13 LAB — COMPREHENSIVE METABOLIC PANEL
ALT: 28 U/L (ref 0–44)
AST: 49 U/L — ABNORMAL HIGH (ref 15–41)
Albumin: 1.1 g/dL — ABNORMAL LOW (ref 3.5–5.0)
Alkaline Phosphatase: 115 U/L (ref 38–126)
Anion gap: 7 (ref 5–15)
BUN: 24 mg/dL — ABNORMAL HIGH (ref 8–23)
CO2: 21 mmol/L — ABNORMAL LOW (ref 22–32)
Calcium: 7.4 mg/dL — ABNORMAL LOW (ref 8.9–10.3)
Chloride: 115 mmol/L — ABNORMAL HIGH (ref 98–111)
Creatinine, Ser: 0.9 mg/dL (ref 0.44–1.00)
GFR calc Af Amer: >60 mL/min (ref >60)
GFR calc non Af Amer: >60 mL/min (ref >60)
Glucose, Bld: 174 mg/dL — ABNORMAL HIGH (ref 70–99)
Potassium: 4 mmol/L (ref 3.5–5.1)
Sodium: 143 mmol/L (ref 135–145)
Total Bilirubin: 0.4 mg/dL (ref 0.3–1.2)
Total Protein: 4 g/dL — ABNORMAL LOW (ref 6.5–8.1)

## 2019-06-13 LAB — GLUCOSE, CAPILLARY
Glucose-Capillary: 154 mg/dL — ABNORMAL HIGH (ref 70–99)
Glucose-Capillary: 184 mg/dL — ABNORMAL HIGH (ref 70–99)
Glucose-Capillary: 203 mg/dL — ABNORMAL HIGH (ref 70–99)
Glucose-Capillary: 227 mg/dL — ABNORMAL HIGH (ref 70–99)
Glucose-Capillary: 247 mg/dL — ABNORMAL HIGH (ref 70–99)
Glucose-Capillary: 259 mg/dL — ABNORMAL HIGH (ref 70–99)

## 2019-06-13 LAB — FERRITIN: Ferritin: 383 ng/mL — ABNORMAL HIGH (ref 11–307)

## 2019-06-13 LAB — CULTURE, BLOOD (ROUTINE X 2)
Culture: NO GROWTH
Culture: NO GROWTH
Special Requests: ADEQUATE
Special Requests: ADEQUATE

## 2019-06-13 LAB — CBC WITH DIFFERENTIAL/PLATELET
Abs Immature Granulocytes: 0.15 10*3/uL — ABNORMAL HIGH (ref 0.00–0.07)
Basophils Absolute: 0 10*3/uL (ref 0.0–0.1)
Basophils Relative: 0 %
Eosinophils Absolute: 0.2 10*3/uL (ref 0.0–0.5)
Eosinophils Relative: 2 %
HCT: 24.6 % — ABNORMAL LOW (ref 36.0–46.0)
Hemoglobin: 7.2 g/dL — ABNORMAL LOW (ref 12.0–15.0)
Immature Granulocytes: 2 %
Lymphocytes Relative: 18 %
Lymphs Abs: 1.6 10*3/uL (ref 0.7–4.0)
MCH: 25.5 pg — ABNORMAL LOW (ref 26.0–34.0)
MCHC: 29.3 g/dL — ABNORMAL LOW (ref 30.0–36.0)
MCV: 87.2 fL (ref 80.0–100.0)
Monocytes Absolute: 0.7 10*3/uL (ref 0.1–1.0)
Monocytes Relative: 8 %
Neutro Abs: 6.2 10*3/uL (ref 1.7–7.7)
Neutrophils Relative %: 70 %
Platelets: 264 10*3/uL (ref 150–400)
RBC: 2.82 MIL/uL — ABNORMAL LOW (ref 3.87–5.11)
RDW: 18.7 % — ABNORMAL HIGH (ref 11.5–15.5)
WBC: 8.7 10*3/uL (ref 4.0–10.5)
nRBC: 0.2 % (ref 0.0–0.2)

## 2019-06-13 LAB — LACTATE DEHYDROGENASE: LDH: 311 U/L — ABNORMAL HIGH (ref 98–192)

## 2019-06-13 LAB — MAGNESIUM: Magnesium: 1.8 mg/dL (ref 1.7–2.4)

## 2019-06-13 NOTE — Progress Notes (Signed)
  Speech Language Pathology Treatment: Cognitive-Linquistic  Patient Details Name: [name] MRN: [id number] DOB: 01/14/1951 Today's Date: 06/13/2019 Time: 0370-4888 SLP Time Calculation (min) (ACUTE ONLY): 13 min  Assessment / Plan / Recommendation Clinical Impression  Pt transferred back to [employer].  Her stroke w/u was negative.  She continues to present with metabolic encephalopathy with confusion, impaired word-finding, fluctuating orientation to circumstances, fluctuating attention and delayed processing time.  Recalled new information after delay of two then five minutes with 50 then 80% accuracy.  Demonstrates improved ability to follow basic two step instructions. Awareness remains impaired.  Given encephalopathy, pt may benefit from re-assessment of cognition at SNF level of care; however, no further acute care SLP is warranted. Our service will sign off.    HPI HPI: 68 y.o. 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 Constance Haw in [place]), A. fib on anticoagulation, CKD stage III, chronic diastolic heart failure-who presented to the ED 10/21 with acute metabolic encephalopathy, hypoglycemia and complicated UTI.  Patient was started on broad-spectrum antimicrobial therapy and subsequently admitted to the hospitalist service.  She was also found to have Covid 19+ on 10/21.  Due to concern for abdominal abscesses-a CT of the abdomen was done-this did not show any acute abnormalities.  Patient was subsequently transferred to [facility] on 10/22-as she was found to be COVID-19 positive.  Change in MS with acute dysarthria morning of 10/23; transferred to [facility] for stroke w/u.  MRI negative; transferred back to [facility] 10/26.      SLP Plan  Other (Comment)       Recommendations  Diet recommendations: Dysphagia 3 (mechanical soft);Thin liquid Liquids provided via:  Cup;Straw Medication Administration: Whole meds with liquid Supervision: Patient able to self feed(assist with set-up and feeding as needed) Compensations: Minimize environmental distractions                Oral Care Recommendations: Oral care BID Follow up Recommendations: Skilled Nursing facility SLP Visit Diagnosis: Cognitive communication deficit (B16.945) Plan: Other (Comment)       GO                [name] 06/13/2019, 3:37 PM  Shauntelle Jamerson L. Tivis Ringer, [place] Office number [phone] Pager [phone]

## 2019-06-13 NOTE — Progress Notes (Signed)
Notified spouse of progress.  All questions were answered and this nurse's contact number shared for further communication.

## 2019-06-13 NOTE — Consult Note (Signed)
   [facility] Inpatient Consult   06/13/2019  [name] Jul 01, 1951 [id number]   Patient screened for extreme high risk score for unplanned readmission score of 63%  and for less than 30 day readmission hospitalization to check if potential [place] [facility] services are needed.  Patient with [facility] PPO    Review of patient's medical record reveals patient is currently being recommended for SNF and was at [facility] prior to admission. Patient is COVID + and per MD notes below which includes but not limited to:  [place] 68 y.o.femalewith atrial fibnllation, LV thrombus, CHF, DM, HTN and morbid obesity who has been transferred from [facility] to [facility] for stroke/TIA work up.  Primary Care Provider is  [name], MD   Plan:  If disposition remains for Skilled Nursing facility then will sign off at disposition.   Please place a [facility] Care Management consult as appropriate and for questions contact:   Natividad Brood, RN BSN [facility] Liaison  [phone] business mobile phone Toll free office [phone]  Fax number: [phone] [email or web address]

## 2019-06-13 NOTE — Progress Notes (Signed)
Applied foam to buttock sacral area and upper posterior thighs to protect masd open areas.

## 2019-06-13 NOTE — Discharge Instructions (Signed)

## 2019-06-13 NOTE — Progress Notes (Signed)
Report called to [place] at [facility]

## 2019-06-13 NOTE — Progress Notes (Signed)
Patient ID: [name], female   DOB: May 11, 1951, 68 y.o.   MRN: [id number]  PROGRESS NOTE    JI FAIRBURN  NWG:[id number] DOB: Mar 23, 1951 DOA: 06/08/2019 PCP: [name], MD   Brief Narrative:  68 year old female with history of pyoderma gangrenosum with chronic abdominal wound followed by surgeon Dr. Blake Divine in [place], MRSA infection, chronic diastolic CHF, atrial fibrillation, COPD, CKD stage III, hypertension presented from rehab facility on 06/08/2019 with altered mental status and hypoglycemia.  She tested positive for COVID-19 on 06/07/2019 and has been complaining of mild cough with mild difficulty breathing and sore throat.  She has a chronic draining wound to her lower abdomen from pyoderma gangrenosum.  She had a recent hospitalization from 05/09/2019-05/25/2019 at [facility] for episodes of hypoglycemia and encephalopathy which required neurology evaluation and EEG which showed no evidence of seizures and she was discharged to rehab facility.  Prior to that, she was discharged on 04/30/2023 on IV Invanz for ESBL bacteremia secondary to infected abdominal wound.  She completed 3 weeks of IV Invanz which was discontinued on 05/21/2019.  Prior to that, she has had chronic abdominal wound recently secondary to diagnosis of pyoderma gangrenosum and also was treated with steroids.  On presentation to the ED on 06/08/2019, she was found to be tachycardic with leukocytosis of 29 and elevated creatinine.  Chest x-ray was negative for acute abnormality.  She was started on broad-spectrum antibiotic.  CT of the abdomen and pelvis was ordered.  She was transferred to [facility] because of COVID-19 being positive.  General surgery did not recommend any surgical intervention.  She was transferred to [place] on 06/09/2019.  On 08/10/2019, she had some slurred speech and question of facial droop.  CT of the brain was negative for acute abnormality.  Neurology recommended  MRI of the brain and transfer to [facility].  She was transferred to [facility] on 06/10/2019.  MRI of the brain was negative for acute infarct.  Cardiology signed off.  She is waiting to be transferred back to [place].  Assessment & Plan:  Slurring of speech/right facial droop with dysarthria:  -She was found to be dysarthric with slight right-sided facial droop on 06/10/2019.  CT of the head was negative for acute abnormality.  Neurology recommended MRI and transfer to [place] brain was negative for acute infarct. -Echo showed EF of 65 to 70%. -Ultrasound carotid showed bilateral ICA 1 to 39% stenosis -PT/OT/SLP evaluation -LDL 25.  A1c 7.4 -Continue Eliquis. -Neurology has signed off.   Metabolic encephalopathy -Most likely secondary to UTI/COVID-19 infection/hypoglycemia -Monitor mental status.  Mental status improving but still slow to respond to questions. -Fall precautions. -During her last hospitalization from 05/09/2019-05/25/2019 at [facility], she had encephalopathy for which neurology had evaluated the patient and she had undergone EEG which was negative for seizures.  COVID-19 infection -Reports mild cough with some difficulty breathing and sore throat -Chest x-ray was negative for acute abnormality. -Still on room air.  Recent Labs    06/11/19 0834 06/12/19 0549  FERRITIN 227 258  LDH 258* 274*  CRP 19.0* 15.8*    Lab Results  Component Value Date   SARSCOV2NAA POSITIVE (A) 06/08/2019   [name] NEGATIVE 05/09/2019   [name] NEGATIVE 04/27/2019   [name] NEGATIVE 04/23/2019   -Monitor inflammatory markers: Labs pending for today. -We will hold off on remdesivir and Solu-Medrol for now. -Continue vitamin C and zinc. -Awaiting to be transferred  back to [facility]  Chronic abdominal wound in a patient with history of pyoderma gangrenosum -CT of the abdomen and pelvis did not show any evidence of abscess or drainable  collection -Patient was empirically started on broad-spectrum antibiotics on presentation.  Patient had recent treatment with 3 weeks of IV Invanz for her abdominal wound infection which was discontinued on 05/21/2019.  She follows up with Dr. Mendel Ryder Bridges/surgeon as an outpatient. -General surgery evaluated the patient and recommended no surgical intervention.  Wound care as per general surgery recommendations.  Leukocytosis -Antibiotic plan as below.  Resolved.  Will monitor.  Hypokalemia -Improved.  Probable UTI  -Urine culture is growing Klebsiella.  Follow sensitivities.  Continue meropenem for now  Acute kidney injury on chronic any disease stage III -Creatinine baseline 0.8-1.3. -Creatinine was 2.24 on 06/07/2019.  Resolved.  Monitor creatinine.  Of IV fluids.  Diabetes mellitus type 2 with hypoglycemia and hyperglycemia -Lantus and NovoLog held.  Blood sugars on the higher side.  Continue lower dose of Lantus.  Continue monitoring CBGs.  Hypoalbuminemia -Probably because of very poor oral intake.  Nutrition eval.  Chronic diastolic CHF -Compensated.  Last echo in 12/2018 showed EF of 50 to 44% with LV diastolic parameters consistent with pseudonormalization.  Not on diuretics.  Chronic atrial fibrillation -Currently rate controlled.  Continue Eliquis and Metoprolol. Cardizem on hold because of low blood pressure.  Anemia of chronic disease -Questionable cause.  Monitor hemoglobin.  No evidence of any bleeding.  Generalized deconditioning -PT recommends SNF placement.  Consult social worker  DVT prophylaxis:  Eliquis Code Status: Full Family Communication: Spoke with husband/[name] on phone on 06/12/2019 Disposition Plan:SNF placement.  Consultants: General surgery  Procedures:  Echo 1. Left ventricular ejection fraction, by visual estimation, is 65 to 70%. The left ventricle has normal function. Normal left ventricular size. Left ventricular septal wall  thickness was mildly increased. Mildly increased left ventricular posterior  wall thickness.  2. Global right ventricle has normal systolic function.The right ventricular size is normal. No increase in right ventricular wall thickness.  3. Left atrial size was normal.  4. Right atrial size was normal.  5. The mitral valve is degenerative. Moderate mitral annular calcification. There is mild thickening of the mitral valve leaflet(s). There is mild calcification of the mitral valve leaflet(s). No evidence of mitral valve regurgitation. Mild mitral  stenosis.  6. The tricuspid valve is normal in structure. Tricuspid valve regurgitation is mild.  7. The aortic valve is normal in structure. Aortic valve regurgitation was not visualized by color flow Doppler. Structurally normal aortic valve, with no evidence of sclerosis or stenosis.  8. The pulmonic valve was normal in structure. Pulmonic valve regurgitation is trivial by color flow Doppler.  9. The inferior vena cava is normal in size with greater than 50% respiratory variability, suggesting right atrial pressure of 3 mmHg.  Antimicrobials:  Anti-infectives (From admission, onward)   Start     Dose/Rate Route Frequency Ordered Stop   06/10/19 2200  vancomycin (VANCOCIN) 1,500 mg in sodium chloride 0.9 % 500 mL IVPB  Status:  Discontinued     1,500 mg 250 mL/hr over 120 Minutes Intravenous Every 48 hours 06/08/19 2007 06/10/19 1057   06/10/19 1000  meropenem (MERREM) 1 g in sodium chloride 0.9 % 100 mL IVPB     1 g 200 mL/hr over 30 Minutes Intravenous Every 8 hours 06/10/19 0848     06/09/19 1000  meropenem (MERREM) 2 g in sodium chloride 0.9 %  100 mL IVPB  Status:  Discontinued     2 g 200 mL/hr over 30 Minutes Intravenous Every 12 hours 06/09/19 0855 06/10/19 0848   06/08/19 2100  ertapenem (INVANZ) 1,000 mg in sodium chloride 0.9 % 100 mL IVPB  Status:  Discontinued     1 g 200 mL/hr over 30 Minutes Intravenous Every 24 hours 06/08/19 2007  06/09/19 0855   06/08/19 1945  vancomycin (VANCOCIN) IVPB 1000 mg/200 mL premix  Status:  Discontinued     1,000 mg 200 mL/hr over 60 Minutes Intravenous  Once 06/08/19 1936 06/08/19 1942   06/08/19 1945  ceFEPIme (MAXIPIME) 2 g in sodium chloride 0.9 % 100 mL IVPB  Status:  Discontinued     2 g 200 mL/hr over 30 Minutes Intravenous  Once 06/08/19 1936 06/08/19 2135   06/08/19 1945  vancomycin (VANCOCIN) 1,500 mg in sodium chloride 0.9 % 500 mL IVPB     1,500 mg 250 mL/hr over 120 Minutes Intravenous  Once 06/08/19 1942 06/09/19 0017       Subjective: Patient seen and examined at bedside.  Poor historian.  No overnight fever, vomiting reported by nursing staff.   Objective: Vitals:   06/12/19 2009 06/12/19 2300 06/13/19 0500 06/13/19 0738  BP: 124/81  108/72 (!) 122/95  Pulse: 83  75 86  Resp: 16  17 14   Temp: 99.8 F (37.7 C) 98.5 F (36.9 C) 99.4 F (37.4 C) 98 F (36.7 C)  TempSrc: Axillary Axillary Rectal Oral  SpO2: 98%  98% 100%  Weight:      Height:        Intake/Output Summary (Last 24 hours) at 06/13/2019 0746 Last data filed at 06/12/2019 2027 Gross per 24 hour  Intake 30 ml  Output 400 ml  Net -370 ml   Filed Weights   06/08/19 1838 06/12/19 0300  Weight: 78 kg 82.9 kg    Examination:  General exam: No acute distress.  Looks chronically ill. Very poor historian Respiratory system: Bilateral decreased breath sounds at bases with some scattered crackles.  Cardiovascular system: S1-S2 heard, rate controlled Gastrointestinal system: Abdomen is nondistended, soft and mildly tender in the lower abdomen.  Lower abdomen dressing present. Normal bowel sounds heard. Extremities: No cyanosis; trace edema Central nervous system: Still slow to respond to questions.  Awake, answers some questions appropriately.  No focal neurological deficits. Moving extremities     Data Reviewed: I have personally reviewed following labs and imaging studies  CBC: Recent Labs    Lab June 16, 2019 1208 06/08/19 1850 06/10/19 0440 06/11/19 0834 06/12/19 0549  WBC 15.1* 29.6* 22.5* 15.3* 10.3  NEUTROABS 12.0* 27.2* 20.4* 13.6* 8.2*  HGB 10.6* 9.4* 7.3* 7.5* 7.4*  HCT 37.4 31.6* 24.0* 24.5* 24.1*  MCV 91.4 87.1 86.3 86.3 85.2  PLT 526* 521* 329 294 865   Basic Metabolic Panel: Recent Labs  Lab 06/16/19 1208 06/08/19 0600 06/08/19 1850 06/09/19 0500 06/10/19 0440 06/11/19 0834 06/12/19 0549  NA 131* 132* 132* 139 138 140 140  K 6.5* 4.0 3.6 3.3* 3.5 3.3* 4.1  CL 101 105 107 115* 113* 113* 113*  CO2 16* 18* 18* 17* 17* 19* 21*  GLUCOSE 305* 158* 61* 84 117* 289* 225*  BUN 65* 62* 56* 46* 39* 30* 24*  CREATININE 2.24* 2.20* 1.67* 1.32* 1.05* 0.83 0.79  CALCIUM 7.9* 7.5* 7.2* 6.6* 7.0* 7.5* 7.6*  MG 1.5* 4.6*  --   --   --  2.0 1.8   GFR: Estimated Creatinine Clearance:  70.1 mL/min (by C-G formula based on SCr of 0.79 mg/dL). Liver Function Tests: Recent Labs  Lab 06/07/19 1208 06/08/19 1850 06/10/19 0440 06/11/19 0834 06/12/19 0549  AST 27 18 22 22 31   ALT 14 17 18 20 23   ALKPHOS 132* 131* 117 123 128*  BILITOT 0.6 0.3 0.4 0.4 0.4  PROT 5.6* 5.1* 4.0* 4.2* 4.2*  ALBUMIN 2.0* 1.7* 1.3* 1.2* 1.2*   No results for input(s): LIPASE, AMYLASE in the last 168 hours. Recent Labs  Lab 06/08/19 1850  AMMONIA 29   Coagulation Profile: Recent Labs  Lab 06/08/19 1850  INR 2.2*   Cardiac Enzymes: No results for input(s): CKTOTAL, CKMB, CKMBINDEX, TROPONINI in the last 168 hours. BNP (last 3 results) No results for input(s): PROBNP in the last 8760 hours. HbA1C: Recent Labs    06/11/19 0451  HGBA1C 7.4*   CBG: Recent Labs  Lab 06/12/19 0909 06/12/19 1310 06/12/19 1636 06/12/19 2019 06/13/19 0356  GLUCAP 212* 209* 192* 184* 184*   Lipid Profile: Recent Labs    06/11/19 0451  CHOL 53  HDL 11*  LDLCALC 25  TRIG 86  CHOLHDL 4.8   Thyroid Function Tests: No results for input(s): TSH, T4TOTAL, FREET4, T3FREE, THYROIDAB in the  last 72 hours. Anemia Panel: Recent Labs    06/11/19 0834 06/12/19 0549  FERRITIN 227 258   Sepsis Labs: Recent Labs  Lab 06/08/19 1850  LATICACIDVEN 1.2    Recent Results (from the past 240 hour(s))  Culture, Urine     Status: Abnormal   Collection Time: 06/07/19  1:44 PM   Specimen: Urine, Clean Catch  Result Value Ref Range Status   Specimen Description   Final    URINE, CLEAN CATCH Performed at [facility], [address], [place], [place] [address]    Special Requests   Final    NONE Performed at [facility], [address], [place], [place] [address]    Culture (A)  Final    >=100,000 COLONIES/mL KLEBSIELLA PNEUMONIAE Confirmed Extended Spectrum Beta-Lactamase Producer (ESBL).  In bloodstream infections from ESBL organisms, carbapenems are preferred over piperacillin/tazobactam. They are shown to have a lower risk of mortality.    Report Status 06/11/2019 FINAL  Final   Organism ID, Bacteria KLEBSIELLA PNEUMONIAE (A)  Final      Susceptibility   Klebsiella pneumoniae - MIC*    AMPICILLIN >=32 RESISTANT Resistant     CEFAZOLIN >=64 RESISTANT Resistant     CEFTRIAXONE >=64 RESISTANT Resistant     CIPROFLOXACIN >=4 RESISTANT Resistant     GENTAMICIN >=16 RESISTANT Resistant     IMIPENEM <=0.25 SENSITIVE Sensitive     NITROFURANTOIN 256 RESISTANT Resistant     TRIMETH/SULFA 40 SENSITIVE Sensitive     AMPICILLIN/SULBACTAM >=32 RESISTANT Resistant     PIP/TAZO >=128 RESISTANT Resistant     Extended ESBL POSITIVE Resistant     * >=100,000 COLONIES/mL KLEBSIELLA PNEUMONIAE  SARS Coronavirus 2 by RT PCR (hospital order, performed in [place] hospital lab)     Status: Abnormal   Collection Time: 06/08/19  2:10 PM  Result Value Ref Range Status   SARS Coronavirus 2 POSITIVE (A) NEGATIVE Final    Comment: RESULT CALLED TO, READ BACK BY AND VERIFIED WITH: JONES,V @1532  BY MATTHEWS, B 10.21.20 (NOTE) If result is NEGATIVE SARS-CoV-2 target nucleic acids are NOT  DETECTED. The SARS-CoV-2 RNA is generally detectable in upper and lower  respiratory specimens during the acute phase of infection. The lowest  concentration of  SARS-CoV-2 viral copies this assay can detect is 250  copies / mL. A negative result does not preclude SARS-CoV-2 infection  and should not be used as the sole basis for treatment or other  patient management decisions.  A negative result may occur with  improper specimen collection / handling, submission of specimen other  than nasopharyngeal swab, presence of viral mutation(s) within the  areas targeted by this assay, and inadequate number of viral copies  (<250 copies / mL). A negative result must be combined with clinical  observations, patient history, and epidemiological information. If result is POSITIVE SARS-CoV-2 target nucleic acids are DETECTED.  The SARS-CoV-2 RNA is generally detectable in upper and lower  respiratory specimens during the acute phase of infection.  Positive  results are indicative of active infection with SARS-CoV-2.  Clinical  correlation with patient history and other diagnostic information is  necessary to determine patient infection status.  Positive results do  not rule out bacterial infection or co-infection with other viruses. If result is PRESUMPTIVE POSTIVE SARS-CoV-2 nucleic acids MAY BE PRESENT.   A presumptive positive result was obtained on the submitted specimen  and confirmed on repeat testing.  While 2019 novel coronavirus  (SARS-CoV-2) nucleic acids may be present in the submitted sample  additional confirmatory testing may be necessary for epidemiological  and / or clinical management purposes  to differentiate between  SARS-CoV-2 and other Sarbecovirus currently known to infect humans.  If clinically indicated additional testing with an alternate test  methodology [phone])  is advised. The SARS-CoV-2 RNA is generally  detectable in upper and lower respiratory specimens during  the acute  phase of infection. The expected result is Negative. Fact Sheet for Patients:  StrictlyIdeas.no Fact Sheet for Healthcare Providers: BankingDealers.co.za This test is not yet approved or cleared by the [place] FDA and has been authorized for detection and/or diagnosis of SARS-CoV-2 by FDA under an Emergency Use Authorization (EUA).  This EUA will remain in effect (meaning this test can be used) for the duration of the COVID-19 declaration under Section 564(b)(1) of the Act, 21 U.S.C. section 360bbb-3(b)(1), unless the authorization is terminated or revoked sooner. Performed at [facility], [address], [place], [place] [address]   Blood culture (routine x 2)     Status: None   Collection Time: 06/08/19  6:50 PM   Specimen: BLOOD  Result Value Ref Range Status   Specimen Description BLOOD PICC LINE  Final   Special Requests   Final    Blood Culture adequate volume BOTTLES DRAWN AEROBIC AND ANAEROBIC   Culture   Final    NO GROWTH 5 DAYS Performed at [facility], [address], [place], [place] [address]    Report Status 06/13/2019 FINAL  Final  Urine culture     Status: Abnormal (Preliminary result)   Collection Time: 06/08/19  6:50 PM   Specimen: Urine, Random  Result Value Ref Range Status   Specimen Description   Final    URINE, RANDOM Performed at [facility], [address] [place], [place] [address]    Special Requests   Final    NONE Performed at [facility], [address] [place], [place] [address]    Culture (A)  Final    70,000 COLONIES/mL KLEBSIELLA PNEUMONIAE REPEATING SUSCEPTIBILITIES TO FOLLOW Performed at [facility], [place] [address], [place], [place] [address]    Report Status PENDING  Incomplete  Blood culture (routine x 2)     Status:  None   Collection Time: 06/08/19  7:02 PM   Specimen: BLOOD  Result Value Ref Range Status   Specimen Description BLOOD PICC LINE  Final    Special Requests Blood Culture adequate volume  Final   Culture   Final    NO GROWTH 5 DAYS Performed at [facility], [address], [place], [place] [address]    Report Status 06/13/2019 FINAL  Final         Radiology Studies: Mr Brain Wo Contrast (charm Study)  Result Date: 06/11/2019 CLINICAL DATA:  Focal neuro deficit. COVID-19 positive. Slurred speech with right-sided weakness. EXAM: MRI HEAD WITHOUT CONTRAST TECHNIQUE: Multiplanar, multiecho pulse sequences of the brain and surrounding structures were obtained without intravenous contrast. COMPARISON:  CT head 06/10/2019 FINDINGS: Brain: Negative for acute infarct Generalized atrophy. Chronic microvascular ischemic changes in the white matter and pons. Negative for hemorrhage or mass. No midline shift. Vascular: Normal arterial flow voids Skull and upper cervical spine: Negative Sinuses/Orbits: Negative Other: None IMPRESSION: Negative for acute infarct Atrophy and moderate chronic microvascular ischemia. Electronically Signed   By: Franchot Gallo M.D.   On: 06/11/2019 11:52        Scheduled Meds:  apixaban  5 mg Oral BID   Chlorhexidine Gluconate Cloth  6 each Topical Daily   feeding supplement (PRO-STAT SUGAR FREE 64)  30 mL Oral TID   folic acid  1 mg Oral Daily   insulin glargine  5 Units Subcutaneous QHS   metoprolol tartrate  50 mg Oral BID   umeclidinium bromide  1 puff Inhalation Daily   vitamin C  1,000 mg Oral Daily   zinc sulfate  220 mg Oral Daily   Continuous Infusions:  meropenem (MERREM) IV 1 g (06/13/19 0730)          Aline August, MD [facility] 06/13/2019, 7:46 AM

## 2019-06-14 ENCOUNTER — Inpatient Hospital Stay (HOSPITAL_COMMUNITY): Payer: BC Managed Care – PPO

## 2019-06-14 DIAGNOSIS — S31109S Unspecified open wound of abdominal wall, unspecified quadrant without penetration into peritoneal cavity, sequela: Secondary | ICD-10-CM

## 2019-06-14 DIAGNOSIS — L089 Local infection of the skin and subcutaneous tissue, unspecified: Secondary | ICD-10-CM

## 2019-06-14 DIAGNOSIS — D649 Anemia, unspecified: Secondary | ICD-10-CM

## 2019-06-14 DIAGNOSIS — U071 COVID-19: Principal | ICD-10-CM

## 2019-06-14 LAB — MAGNESIUM: Magnesium: 1.5 mg/dL — ABNORMAL LOW (ref 1.7–2.4)

## 2019-06-14 LAB — COMPREHENSIVE METABOLIC PANEL
ALT: 27 U/L (ref 0–44)
AST: 21 U/L (ref 15–41)
Albumin: 1.2 g/dL — ABNORMAL LOW (ref 3.5–5.0)
Alkaline Phosphatase: 112 U/L (ref 38–126)
Anion gap: 6 (ref 5–15)
BUN: 28 mg/dL — ABNORMAL HIGH (ref 8–23)
CO2: 21 mmol/L — ABNORMAL LOW (ref 22–32)
Calcium: 7.3 mg/dL — ABNORMAL LOW (ref 8.9–10.3)
Chloride: 112 mmol/L — ABNORMAL HIGH (ref 98–111)
Creatinine, Ser: 0.8 mg/dL (ref 0.44–1.00)
GFR calc Af Amer: >60 mL/min (ref >60)
GFR calc non Af Amer: >60 mL/min (ref >60)
Glucose, Bld: 312 mg/dL — ABNORMAL HIGH (ref 70–99)
Potassium: 3.9 mmol/L (ref 3.5–5.1)
Sodium: 139 mmol/L (ref 135–145)
Total Bilirubin: 0.6 mg/dL (ref 0.3–1.2)
Total Protein: 4 g/dL — ABNORMAL LOW (ref 6.5–8.1)

## 2019-06-14 LAB — CBC WITH DIFFERENTIAL/PLATELET
Abs Immature Granulocytes: 0.2 10*3/uL — ABNORMAL HIGH (ref 0.00–0.07)
Basophils Absolute: 0 10*3/uL (ref 0.0–0.1)
Basophils Relative: 0 %
Eosinophils Absolute: 0.3 10*3/uL (ref 0.0–0.5)
Eosinophils Relative: 3 %
HCT: 22.9 % — ABNORMAL LOW (ref 36.0–46.0)
Hemoglobin: 6.8 g/dL — CL (ref 12.0–15.0)
Immature Granulocytes: 2 %
Lymphocytes Relative: 14 %
Lymphs Abs: 1.4 10*3/uL (ref 0.7–4.0)
MCH: 26.4 pg (ref 26.0–34.0)
MCHC: 29.7 g/dL — ABNORMAL LOW (ref 30.0–36.0)
MCV: 88.8 fL (ref 80.0–100.0)
Monocytes Absolute: 0.7 10*3/uL (ref 0.1–1.0)
Monocytes Relative: 7 %
Neutro Abs: 7.1 10*3/uL (ref 1.7–7.7)
Neutrophils Relative %: 74 %
Platelets: 246 10*3/uL (ref 150–400)
RBC: 2.58 MIL/uL — ABNORMAL LOW (ref 3.87–5.11)
RDW: 18.6 % — ABNORMAL HIGH (ref 11.5–15.5)
WBC: 9.6 10*3/uL (ref 4.0–10.5)
nRBC: 0.2 % (ref 0.0–0.2)

## 2019-06-14 LAB — PREPARE RBC (CROSSMATCH)

## 2019-06-14 LAB — GLUCOSE, CAPILLARY
Glucose-Capillary: 322 mg/dL — ABNORMAL HIGH (ref 70–99)
Glucose-Capillary: 246 mg/dL — ABNORMAL HIGH (ref 70–99)
Glucose-Capillary: 245 mg/dL — ABNORMAL HIGH (ref 70–99)
Glucose-Capillary: 248 mg/dL — ABNORMAL HIGH (ref 70–99)
Glucose-Capillary: 250 mg/dL — ABNORMAL HIGH (ref 70–99)
Glucose-Capillary: 230 mg/dL — ABNORMAL HIGH (ref 70–99)

## 2019-06-14 LAB — LACTATE DEHYDROGENASE: LDH: 299 U/L — ABNORMAL HIGH (ref 98–192)

## 2019-06-14 LAB — C-REACTIVE PROTEIN: CRP: 10.5 mg/dL — ABNORMAL HIGH (ref <1.0)

## 2019-06-14 LAB — FERRITIN: Ferritin: 269 ng/mL (ref 11–307)

## 2019-06-14 LAB — ABO/RH: ABO/RH(D): A POS

## 2019-06-14 MED ORDER — FUROSEMIDE 10 MG/ML IJ SOLN
20.0000 mg | Freq: Once | INTRAMUSCULAR | Status: DC
  Filled 2019-06-14: qty 2

## 2019-06-14 MED ORDER — ADULT MULTIVITAMIN W/MINERALS CH
1.0000 | ORAL_TABLET | Freq: Every day | ORAL | Status: DC
  Administered 2019-06-14 – 2019-06-17 (×4): 1 via ORAL
  Filled 2019-06-14 (×4): qty 1

## 2019-06-14 MED ORDER — PANTOPRAZOLE SODIUM 40 MG IV SOLR
40.0000 mg | Freq: Two times a day (BID) | INTRAVENOUS | Status: DC
  Administered 2019-06-14 – 2019-06-15 (×3): 40 mg via INTRAVENOUS
  Filled 2019-06-14 (×3): qty 40

## 2019-06-14 MED ORDER — INSULIN GLARGINE 100 UNIT/ML ~~LOC~~ SOLN
15.0000 [IU] | Freq: Every day | SUBCUTANEOUS | Status: DC
  Administered 2019-06-14: 15 [IU] via SUBCUTANEOUS
  Filled 2019-06-14 (×2): qty 0.15

## 2019-06-14 MED ORDER — INSULIN ASPART 100 UNIT/ML ~~LOC~~ SOLN
0.0000 [IU] | Freq: Every day | SUBCUTANEOUS | Status: DC
  Administered 2019-06-14: 2 [IU] via SUBCUTANEOUS

## 2019-06-14 MED ORDER — ENSURE ENLIVE PO LIQD
237.0000 mL | Freq: Three times a day (TID) | ORAL | Status: DC
  Administered 2019-06-14 – 2019-06-17 (×5): 237 mL via ORAL

## 2019-06-14 MED ORDER — SODIUM CHLORIDE 0.9% IV SOLUTION
Freq: Once | INTRAVENOUS | Status: AC
  Administered 2019-06-14: 10:00:00 via INTRAVENOUS

## 2019-06-14 MED ORDER — FUROSEMIDE 10 MG/ML IJ SOLN
20.0000 mg | Freq: Once | INTRAMUSCULAR | Status: AC
  Administered 2019-06-14: 17:00:00 20 mg via INTRAVENOUS

## 2019-06-14 MED ORDER — INSULIN ASPART 100 UNIT/ML ~~LOC~~ SOLN
0.0000 [IU] | Freq: Three times a day (TID) | SUBCUTANEOUS | Status: DC
  Administered 2019-06-14: 3 [IU] via SUBCUTANEOUS

## 2019-06-14 NOTE — Progress Notes (Signed)
Patient ID: [name], female   DOB: 1951-03-22, 68 y.o.   MRN: [id number]  PROGRESS NOTE    [name]  OEU:[id number] DOB: 06-04-1951 DOA: 06/08/2019 PCP: [name], MD   Brief Narrative:   68 year old female with history of pyoderma gangrenosum with chronic abdominal wound followed by surgeon Dr. Blake Divine in [place], MRSA infection, chronic diastolic CHF, atrial fibrillation, COPD, CKD stage III, hypertension presented from rehab facility on 06/08/2019 with altered mental status and hypoglycemia.  She tested positive for COVID-19 on 06/07/2019 and has been complaining of mild cough with mild difficulty breathing and sore throat.  She has a chronic draining wound to her lower abdomen from pyoderma gangrenosum.  She had a recent hospitalization from 05/09/2019-05/25/2019 at [facility] for episodes of hypoglycemia and encephalopathy which required neurology evaluation and EEG which showed no evidence of seizures and she was discharged to rehab facility.  Prior to that, she was discharged on 04/30/2023 on IV Invanz for ESBL bacteremia secondary to infected abdominal wound.  She completed 3 weeks of IV Invanz which was discontinued on 05/21/2019.  Prior to that, she has had chronic abdominal wound recently secondary to diagnosis of pyoderma gangrenosum and also was treated with steroids.  On presentation to the ED on 06/08/2019, she was found to be tachycardic with leukocytosis of 29 and elevated creatinine.  Chest x-ray was negative for acute abnormality.  She was started on broad-spectrum antibiotic.  CT of the abdomen and pelvis was ordered.  She was transferred to [facility] because of COVID-19 being positive.  General surgery did not recommend any surgical intervention.  She was transferred to [place] on 06/09/2019.  On 08/10/2019, she had some slurred speech and question of facial droop.  CT of the brain was negative for acute abnormality.  Neurology  recommended MRI of the brain and transfer to [facility].  She was transferred to [facility] on 06/10/2019.  MRI of the brain was negative for acute infarct.  Neurology  signed off.  Was transferred back to [facility] 10/27.  Assessment & Plan:  Slurring of speech/right facial droop with dysarthria:  -She was found to be dysarthric with slight right-sided facial droop on 06/10/2019.  CT of the head was negative for acute abnormality.  Neurology recommended MRI and transfer to [facility] brain was negative for acute infarct. -Echo showed EF of 65 to 70%. -Ultrasound carotid showed bilateral ICA 1 to 39% stenosis -PT/OT/SLP evaluation -LDL 25.  A1c 7.4 -Neurology has signed off.   Metabolic encephalopathy -Most likely secondary to UTI/COVID-19 infection/hypoglycemia -During her last hospitalization from 05/09/2019-05/25/2019 at [facility], she had encephalopathy for which neurology had evaluated the patient and she had undergone EEG which was negative for seizures. -MRI brain with no acute findings, mental status is consistently improving, she is awake alert and appropriate, slightly slow to respond  COVID-19 infection -Reports mild cough with some difficulty breathing and sore throat -Chest x-ray was negative for acute abnormality.  She is saturating 100% on 2 L nasal cannula this morning, no steroids or severe initiated, but her CRP were significantly elevated at one point at 19, I will repeat portable chest, if she has any new opacity or infiltrate I will start her on remdesivir. -Monitor inflammatory markers -If she is truly requiring  2 L nasal cannula and has some opacity on her imaging then I will start her on steroids as well -Continue vitamin C and  zinc. -Awaiting to be transferred back to [place]    06/12/19 0549 06/13/19 0755 06/14/19 0500  FERRITIN 258 383* 269  LDH 274* 311* 299*  CRP 15.8* 11.3* 10.5*    Lab Results    Component Value Date   SARSCOV2NAA POSITIVE (A) 06/08/2019   [place] NEGATIVE 05/09/2019   [place] NEGATIVE 04/27/2019   [place] NEGATIVE 04/23/2019    Chronic atrial fibrillation -Currently rate controlled.  Continue Metoprolol. Cardizem on hold because of low blood pressure. -She is on Eliquis, I will hold today there is no GI bleed causing her anemia.  Anemia  -Apears to be multifactorial, anemia of iron deficiency, where she gets iron transfusions frequently as an outpatient, and anemia of chronic disease. -Hemoglobin is low at 6.8 today, will go ahead and transfuse 2 units PRBC, there is no evidence of GI bleed, patient had normal colored stool with no melena or blood earlier today, will send Hemoccult, I will hold Eliquis for next 24 hours Hemoccult was obtained, and if hemoglobin is stable it can be resumed.  Patient had endoscopy this January which was significant for mild gastritis, I will keep on IV Protonix 40 mg twice daily.  Chronic abdominal wound in a patient with history of pyoderma gangrenosum -CT of the abdomen and pelvis did not show any evidence of abscess or drainable collection -Patient was empirically started on broad-spectrum antibiotics on presentation.  Patient had recent treatment with 3 weeks of IV Invanz for her abdominal wound infection which was discontinued on 05/21/2019.  She follows up with Dr. Mendel Ryder Bridges/surgeon as an outpatient. -General surgery evaluated the patient and recommended no surgical intervention.  Wound care as per general surgery recommendations.   ESBL UTI  - Continue meropenem x 7days  Acute kidney injury on chronic any disease stage III -Creatinine baseline 0.8-1.3. -Creatinine was 2.24 on 06/07/2019.  Resolved.    Diabetes mellitus type 2 with hypoglycemia and hyperglycemia -Uncontrolled, will increase Lantus to 15 units, continue with insulin sliding scale  Chronic diastolic CHF -Compensated.  Last echo in 12/2018  showed EF of 50 to 54% with LV diastolic parameters consistent with pseudonormalization.  Not on diuretics.  Generalized deconditioning -PT recommends SNF placement.  Consult Education officer, museum  DVT prophylaxis:  Eliquis(on Hold) Code Status: Full Family Communication: Discussed with husband via phone today. Disposition Plan:SNF placement.  Consultants: General surgery  Procedures:  Echo 1. Left ventricular ejection fraction, by visual estimation, is 65 to 70%. The left ventricle has normal function. Normal left ventricular size. Left ventricular septal wall thickness was mildly increased. Mildly increased left ventricular posterior  wall thickness.  2. Global right ventricle has normal systolic function.The right ventricular size is normal. No increase in right ventricular wall thickness.  3. Left atrial size was normal.  4. Right atrial size was normal.  5. The mitral valve is degenerative. Moderate mitral annular calcification. There is mild thickening of the mitral valve leaflet(s). There is mild calcification of the mitral valve leaflet(s). No evidence of mitral valve regurgitation. Mild mitral  stenosis.  6. The tricuspid valve is normal in structure. Tricuspid valve regurgitation is mild.  7. The aortic valve is normal in structure. Aortic valve regurgitation was not visualized by color flow Doppler. Structurally normal aortic valve, with no evidence of sclerosis or stenosis.  8. The pulmonic valve was normal in structure. Pulmonic valve regurgitation is trivial by color flow Doppler.  9. The inferior vena cava is normal in size with greater than  50% respiratory variability, suggesting right atrial pressure of 3 mmHg.  Antimicrobials:  Anti-infectives (From admission, onward)   Start     Dose/Rate Route Frequency Ordered Stop   06/10/19 2200  vancomycin (VANCOCIN) 1,500 mg in sodium chloride 0.9 % 500 mL IVPB  Status:  Discontinued     1,500 mg 250 mL/hr over 120 Minutes Intravenous  Every 48 hours 06/08/19 2007 06/10/19 1057   06/10/19 1000  meropenem (MERREM) 1 g in sodium chloride 0.9 % 100 mL IVPB     1 g 200 mL/hr over 30 Minutes Intravenous Every 8 hours 06/10/19 0848 06/15/19 2359   06/09/19 1000  meropenem (MERREM) 2 g in sodium chloride 0.9 % 100 mL IVPB  Status:  Discontinued     2 g 200 mL/hr over 30 Minutes Intravenous Every 12 hours 06/09/19 0855 06/10/19 0848   06/08/19 2100  ertapenem (INVANZ) 1,000 mg in sodium chloride 0.9 % 100 mL IVPB  Status:  Discontinued     1 g 200 mL/hr over 30 Minutes Intravenous Every 24 hours 06/08/19 2007 06/09/19 0855   06/08/19 1945  vancomycin (VANCOCIN) IVPB 1000 mg/200 mL premix  Status:  Discontinued     1,000 mg 200 mL/hr over 60 Minutes Intravenous  Once 06/08/19 1936 06/08/19 1942   06/08/19 1945  ceFEPIme (MAXIPIME) 2 g in sodium chloride 0.9 % 100 mL IVPB  Status:  Discontinued     2 g 200 mL/hr over 30 Minutes Intravenous  Once 06/08/19 1936 06/08/19 2135   06/08/19 1945  vancomycin (VANCOCIN) 1,500 mg in sodium chloride 0.9 % 500 mL IVPB     1,500 mg 250 mL/hr over 120 Minutes Intravenous  Once 06/08/19 1942 06/09/19 0017       Subjective: Patient seen and examined at bedside.  He denies any complaints, had normal color bowel movement earlier today .  Objective: Vitals:   06/14/19 0411 06/14/19 0744 06/14/19 1315 06/14/19 1352  BP: (!) 106/59 103/67 (!) 100/58 (!) 103/51  Pulse: 86 98 88 86  Resp: 16 18 18 16   Temp: 97.9 F (36.6 C) 98.2 F (36.8 C) 98.4 F (36.9 C) 98.2 F (36.8 C)  TempSrc: Oral Oral Oral Oral  SpO2: 100% 100% 98% 98%  Weight:      Height:        Intake/Output Summary (Last 24 hours) at 06/14/2019 1434 Last data filed at 06/14/2019 1400 Gross per 24 hour  Intake 810 ml  Output 500 ml  Net 310 ml   Filed Weights   06/08/19 1838 06/12/19 0300  Weight: 78 kg 82.9 kg    Examination:  Awake Alert, Oriented X 3, answering questions appropriately, but overall slow to  respond Symmetrical Chest wall movement, Good air movement bilaterally, CTAB RRR,No Gallops,Rubs or new Murmurs, No Parasternal Heave +ve B.Sounds, Abd Soft, lower abdomen dressing present No Cyanosis, Clubbing or edema,    Data Reviewed: I have personally reviewed following labs and imaging studies  CBC: Recent Labs  Lab 06/10/19 0440 06/11/19 0834 06/12/19 0549 06/13/19 0755 06/14/19 0500  WBC 22.5* 15.3* 10.3 8.7 9.6  NEUTROABS 20.4* 13.6* 8.2* 6.2 7.1  HGB 7.3* 7.5* 7.4* 7.2* 6.8*  HCT 24.0* 24.5* 24.1* 24.6* 22.9*  MCV 86.3 86.3 85.2 87.2 88.8  PLT 329 294 275 264 768   Basic Metabolic Panel: Recent Labs  Lab 06/08/19 0600  06/10/19 0440 06/11/19 0834 06/12/19 0549 06/13/19 0755 06/14/19 0500  NA 132*   < > 138 140 140 143 139  K 4.0   < > 3.5 3.3* 4.1 4.0 3.9  CL 105   < > 113* 113* 113* 115* 112*  CO2 18*   < > 17* 19* 21* 21* 21*  GLUCOSE 158*   < > 117* 289* 225* 174* 312*  BUN 62*   < > 39* 30* 24* 24* 28*  CREATININE 2.20*   < > 1.05* 0.83 0.79 0.90 0.80  CALCIUM 7.5*   < > 7.0* 7.5* 7.6* 7.4* 7.3*  MG 4.6*  --   --  2.0 1.8 1.8 1.5*   < > = values in this interval not displayed.   GFR: Estimated Creatinine Clearance: 70.1 mL/min (by C-G formula based on SCr of 0.8 mg/dL). Liver Function Tests: Recent Labs  Lab 06/10/19 0440 06/11/19 0834 06/12/19 0549 06/13/19 0755 06/14/19 0500  AST 22 22 31  49* 21  ALT 18 20 23 28 27   ALKPHOS 117 123 128* 115 112  BILITOT 0.4 0.4 0.4 0.4 0.6  PROT 4.0* 4.2* 4.2* 4.0* 4.0*  ALBUMIN 1.3* 1.2* 1.2* 1.1* 1.2*   No results for input(s): LIPASE, AMYLASE in the last 168 hours. Recent Labs  Lab 06/08/19 1850  AMMONIA 29   Coagulation Profile: Recent Labs  Lab 06/08/19 1850  INR 2.2*   Cardiac Enzymes: No results for input(s): CKTOTAL, CKMB, CKMBINDEX, TROPONINI in the last 168 hours. BNP (last 3 results) No results for input(s): PROBNP in the last 8760 hours. HbA1C: No results for input(s): HGBA1C in  the last 72 hours. CBG: Recent Labs  Lab 06/13/19 2017 06/13/19 2336 06/14/19 0408 06/14/19 0749 06/14/19 1136  GLUCAP 247* 259* 322* 246* 245*   Lipid Profile: No results for input(s): CHOL, HDL, LDLCALC, TRIG, CHOLHDL, LDLDIRECT in the last 72 hours. Thyroid Function Tests: No results for input(s): TSH, T4TOTAL, FREET4, T3FREE, THYROIDAB in the last 72 hours. Anemia Panel: Recent Labs    06/13/19 0755 06/14/19 0500  FERRITIN 383* 269   Sepsis Labs: Recent Labs  Lab 06/08/19 1850  LATICACIDVEN 1.2    Recent Results (from the past 240 hour(s))  Culture, Urine     Status: Abnormal   Collection Time: 06/07/19  1:44 PM   Specimen: Urine, Clean Catch  Result Value Ref Range Status   Specimen Description   Final    URINE, CLEAN CATCH Performed at [facility], [address], [place], [place] [address]    Special Requests   Final    NONE Performed at [facility], [address], [place], [place] [address]    Culture (A)  Final    >=100,000 COLONIES/mL KLEBSIELLA PNEUMONIAE Confirmed Extended Spectrum Beta-Lactamase Producer (ESBL).  In bloodstream infections from ESBL organisms, carbapenems are preferred over piperacillin/tazobactam. They are shown to have a lower risk of mortality.    Report Status 06/11/2019 FINAL  Final   Organism ID, Bacteria KLEBSIELLA PNEUMONIAE (A)  Final      Susceptibility   Klebsiella pneumoniae - MIC*    AMPICILLIN >=32 RESISTANT Resistant     CEFAZOLIN >=64 RESISTANT Resistant     CEFTRIAXONE >=64 RESISTANT Resistant     CIPROFLOXACIN >=4 RESISTANT Resistant     GENTAMICIN >=16 RESISTANT Resistant     IMIPENEM <=0.25 SENSITIVE Sensitive     NITROFURANTOIN 256 RESISTANT Resistant     TRIMETH/SULFA 40 SENSITIVE Sensitive     AMPICILLIN/SULBACTAM >=32 RESISTANT Resistant     PIP/TAZO >=128 RESISTANT Resistant     Extended ESBL POSITIVE Resistant     * >=100,000 COLONIES/mL  KLEBSIELLA PNEUMONIAE  SARS Coronavirus 2 by RT PCR (hospital  order, performed in [place] hospital lab)     Status: Abnormal   Collection Time: 06/08/19  2:10 PM  Result Value Ref Range Status   SARS Coronavirus 2 POSITIVE (A) NEGATIVE Final    Comment: RESULT CALLED TO, READ BACK BY AND VERIFIED WITH: JONES,V @1532  BY MATTHEWS, B 10.21.20 (NOTE) If result is NEGATIVE SARS-CoV-2 target nucleic acids are NOT DETECTED. The SARS-CoV-2 RNA is generally detectable in upper and lower  respiratory specimens during the acute phase of infection. The lowest  concentration of SARS-CoV-2 viral copies this assay can detect is 250  copies / mL. A negative result does not preclude SARS-CoV-2 infection  and should not be used as the sole basis for treatment or other  patient management decisions.  A negative result may occur with  improper specimen collection / handling, submission of specimen other  than nasopharyngeal swab, presence of viral mutation(s) within the  areas targeted by this assay, and inadequate number of viral copies  (<250 copies / mL). A negative result must be combined with clinical  observations, patient history, and epidemiological information. If result is POSITIVE SARS-CoV-2 target nucleic acids are DETECTED.  The SARS-CoV-2 RNA is generally detectable in upper and lower  respiratory specimens during the acute phase of infection.  Positive  results are indicative of active infection with SARS-CoV-2.  Clinical  correlation with patient history and other diagnostic information is  necessary to determine patient infection status.  Positive results do  not rule out bacterial infection or co-infection with other viruses. If result is PRESUMPTIVE POSTIVE SARS-CoV-2 nucleic acids MAY BE PRESENT.   A presumptive positive result was obtained on the submitted specimen  and confirmed on repeat testing.  While 2019 novel coronavirus  (SARS-CoV-2) nucleic acids may be present in the submitted sample  additional confirmatory testing may be  necessary for epidemiological  and / or clinical management purposes  to differentiate between  SARS-CoV-2 and other Sarbecovirus currently known to infect humans.  If clinically indicated additional testing with an alternate test  methodology [phone])  is advised. The SARS-CoV-2 RNA is generally  detectable in upper and lower respiratory specimens during the acute  phase of infection. The expected result is Negative. Fact Sheet for Patients:  StrictlyIdeas.no Fact Sheet for Healthcare Providers: BankingDealers.co.za This test is not yet approved or cleared by the [place] FDA and has been authorized for detection and/or diagnosis of SARS-CoV-2 by FDA under an Emergency Use Authorization (EUA).  This EUA will remain in effect (meaning this test can be used) for the duration of the COVID-19 declaration under Section 564(b)(1) of the Act, 21 U.S.C. section 360bbb-3(b)(1), unless the authorization is terminated or revoked sooner. Performed at [facility], [address], [place], [name] [address]   Blood culture (routine x 2)     Status: None   Collection Time: 06/08/19  6:50 PM   Specimen: BLOOD  Result Value Ref Range Status   Specimen Description BLOOD PICC LINE  Final   Special Requests   Final    Blood Culture adequate volume BOTTLES DRAWN AEROBIC AND ANAEROBIC   Culture   Final    NO GROWTH 5 DAYS Performed at [facility], [address] [place], [name] [address]    Report Status 06/13/2019 FINAL  Final  Urine culture     Status: Abnormal (Preliminary result)   Collection Time: 06/08/19  6:50 PM   Specimen: Urine, Random  Result Value Ref Range Status   Specimen Description   Final    URINE, RANDOM Performed at [facility], [address] [place], [place] [address]    Special Requests   Final    NONE Performed at [facility], [address], [place], [place] [address]    Culture (A)  Final    70,000 COLONIES/mL  KLEBSIELLA PNEUMONIAE REPEATING SUSCEPTIBILITIES TO FOLLOW CULTURE REINCUBATED FOR BETTER GROWTH Performed at [facility], [place] [address] [place], [place] [address]    Report Status PENDING  Incomplete  Blood culture (routine x 2)     Status: None   Collection Time: 06/08/19  7:02 PM   Specimen: BLOOD  Result Value Ref Range Status   Specimen Description BLOOD PICC LINE  Final   Special Requests Blood Culture adequate volume  Final   Culture   Final    NO GROWTH 5 DAYS Performed at [facility], [address] [place], [place] [address]    Report Status 06/13/2019 FINAL  Final         Radiology Studies: No results found.      Scheduled Meds:  Chlorhexidine Gluconate Cloth  6 each Topical Daily   feeding supplement (ENSURE ENLIVE)  237 mL Oral TID BM   feeding supplement (PRO-STAT SUGAR FREE 64)  30 mL Oral TID   folic acid  1 mg Oral Daily   furosemide  20 mg Intravenous Once   furosemide  20 mg Intravenous Once   insulin aspart  0-5 Units Subcutaneous QHS   insulin aspart  0-9 Units Subcutaneous TID WC   insulin glargine  5 Units Subcutaneous QHS   metoprolol tartrate  50 mg Oral BID   multivitamin with minerals  1 tablet Oral Daily   pantoprazole (PROTONIX) IV  40 mg Intravenous Q12H   umeclidinium bromide  1 puff Inhalation Daily   vitamin C  1,000 mg Oral Daily   zinc sulfate  220 mg Oral Daily   Continuous Infusions:  meropenem (MERREM) IV 1 g (06/14/19 0800)          Phillips Climes, MD [facility] 06/14/2019, 2:34 PM

## 2019-06-14 NOTE — Progress Notes (Signed)
This Probation officer spoke with spouse Rosy Estabrook and had patient speak to spouse on speaker phone for updates. Education provided on pt's order for 2 unit PRBC r/t protocol and risks for reaction, etc. All parties verbalized understanding. No further questions or concerns noted at this time

## 2019-06-14 NOTE — Progress Notes (Signed)
First unit of PRBC started at 1337 @75ml /hr Per policy pre-vitals done, consent signed and in chart. Pt and family educated on blood administration. This Probation officer remained w patient for 15 minutes to monitor for s/s reaction. No reaction noted and rate increased to 134m/hr. Pt tolerating well, VS WNL. Will give report to oncoming RN.

## 2019-06-14 NOTE — Progress Notes (Addendum)
Initial Nutrition Assessment  DOCUMENTATION CODES:   Obesity unspecified  INTERVENTION:    Ensure Enlive po TID, each supplement provides 350 kcal and 20 grams of protein.    Recommend adjust insulin/DM meds as needed to control elevated glucoses.    Continue Pro-stat 30 ml TID.   MVI daily.   Pt receiving Hormel Shake daily with Breakfast which provides 520 kcals and 22 g of protein and Magic cup BID with lunch and dinner, each supplement provides 290 kcal and 9 grams of protein, automatically on meal trays to optimize nutritional intake.    Given extremely poor intake and increased nutrient needs for healing, recommend placing Cortrak tube for enteral nutrition support. Glucerna 1.2 at 70 ml/h would provide 2016 kcal, 101 gm protein, 1352 ml free water daily.  NUTRITION DIAGNOSIS:   Increased nutrient needs related to wound healing, acute illness(COVID-19) as evidenced by estimated needs.  GOAL:   Patient will meet greater than or equal to 90% of their needs  MONITOR:   PO intake, Supplement acceptance, Labs, Skin  REASON FOR ASSESSMENT:   Consult Assessment of nutrition requirement/status, Diet education  ASSESSMENT:   68 yo female admitted with AMS and hypoglycemia; tested positive for COVID-19 on 10/20. PMH includes pyoderma gangrenosum with chronic draining wound, A fib, LV thrombus, CHF, DM, HTN, morbid obesity, COPD, CKD-III.   Received consult for hypoalbuminemia. Low albumin reflects acute stress response and inflammatory process. Albumin is not a good indicator of nutrition status, especially in hospitalized patients.   Unable to speak with patient, no answer when room number called. She has been consuming 0-10% of meals per RN documentation. Currently on a pureed diet. She is also receiving Pro-stat 30 ml TID.   Labs reviewed. Magnesium 1.5 (L), Hgb 6.8 (L) CBG's: 322-246  Medications reviewed and include folic acid, lasix, lantus, vitamin C,  zinc.   NUTRITION - FOCUSED PHYSICAL EXAM:  deferred  Diet Order:   Diet Order            DIET - DYS 1 Room service appropriate? Yes; Fluid consistency: Thin  Diet effective now              EDUCATION NEEDS:   Not appropriate for education at this time  Skin:  Skin Assessment: Skin Integrity Issues: Skin Integrity Issues:: Other (Comment), Incisions Incisions: open abdominal wound Other: open wound to L thigh; MASD to L buttocks   Last BM:  10/26  Height:   Ht Readings from Last 1 Encounters:  06/08/19 5' 4"  (1.626 m)    Weight:   Wt Readings from Last 1 Encounters:  06/12/19 82.9 kg    Ideal Body Weight:  54.5 kg  BMI:  Body mass index is 31.37 kg/m.  Estimated Nutritional Needs:   Kcal:  1900-2100  Protein:  100-120 gm  Fluid:  >/= 2 L    Molli Barrows, RD, LDN, [place] Pager [phone] After Hours Pager [phone]

## 2019-06-14 NOTE — Progress Notes (Signed)
Ok to stop Merrem at 7d for ESBL UTI per Dr. Waldron Labs. Treatment with abx for the abd issue is no longer warranted. She is currently on D6 abx.   Dc Merrem after tomorrow  Onnie Boer, PharmD, BCIDP, AAHIVP, CPP Infectious Disease Pharmacist 06/14/2019 2:26 PM

## 2019-06-14 NOTE — Progress Notes (Signed)
Late Entry for 10.27.20 @ 1605. Assumed care of patient after Neldon Mc, RN. Patient found resting in bed watching afternoon television program. First unit of prbcs infusing to left upper arm PICC. No acute distress noted. Incontinent of stool; peri care performed. Partial linen change completed. Side rails x 3.

## 2019-06-14 NOTE — Progress Notes (Signed)
Inpatient Diabetes Program Recommendations  AACE/ADA: New Consensus Statement on Inpatient Glycemic Control   Target Ranges:  Prepandial:   less than 140 mg/dL      Peak postprandial:   less than 180 mg/dL (1-2 hours)      Critically ill patients:  140 - 180 mg/dL   Results for MAKINZE, JANI (MRN [id number]) as of 06/14/2019 11:03  Ref. Range 06/13/2019 08:38 06/13/2019 14:53 06/13/2019 16:28 06/13/2019 20:17 06/13/2019 23:36 06/14/2019 04:08 06/14/2019 07:49  Glucose-Capillary Latest Ref Range: 70 - 99 mg/dL 154 (H) 203 (H) 227 (H) 247 (H) 259 (H) 322 (H) 246 (H)   Review of Glycemic Control  Diabetes history: DM2 Outpatient Diabetes medications: Lantus 10 units QHS, Novolog 6 units TID with meals Current orders for Inpatient glycemic control: Lantus 5 units QHS  Inpatient Diabetes Program Recommendations:   Insulin-Correction: Please consider ordering custom scale of Novolog 0-5 units Q4H as follows: -Custom Novolog correction scale 0-5 units       151-200  1 unit      201-250  2 units      251-300  3 units      301-350  4 units      351-400  5 units    NOTE: Per RD note today, poor PO intake and RD recommending Cortrak with enteral nutrition. Recommend ordering Novolog custom correction scale as noted above.   Thanks, Barnie Alderman, RN, MSN, CDE Diabetes Coordinator Inpatient Diabetes Program [phone] (Team Pager from 8am to 5pm)

## 2019-06-14 NOTE — Progress Notes (Signed)
RITICAL VALUE ALERT  Critical Value:  Hgb 6.8  Date & Time Notied:  06/14/19 @  0728  Provider Notified: Elgergawy  Orders Received/Actions taken:

## 2019-06-15 LAB — URINE CULTURE: Culture: 70000 — AB

## 2019-06-15 LAB — TYPE AND SCREEN
ABO/RH(D): A POS
Antibody Screen: NEGATIVE
Unit division: 0
Unit division: 0

## 2019-06-15 LAB — COMPREHENSIVE METABOLIC PANEL
ALT: 26 U/L (ref 0–44)
AST: 17 U/L (ref 15–41)
Albumin: 1.3 g/dL — ABNORMAL LOW (ref 3.5–5.0)
Alkaline Phosphatase: 98 U/L (ref 38–126)
Anion gap: 6 (ref 5–15)
BUN: 26 mg/dL — ABNORMAL HIGH (ref 8–23)
CO2: 22 mmol/L (ref 22–32)
Calcium: 7.2 mg/dL — ABNORMAL LOW (ref 8.9–10.3)
Chloride: 110 mmol/L (ref 98–111)
Creatinine, Ser: 0.65 mg/dL (ref 0.44–1.00)
GFR calc Af Amer: >60 mL/min (ref >60)
GFR calc non Af Amer: >60 mL/min (ref >60)
Glucose, Bld: 71 mg/dL (ref 70–99)
Potassium: 3.6 mmol/L (ref 3.5–5.1)
Sodium: 138 mmol/L (ref 135–145)
Total Bilirubin: 0.4 mg/dL (ref 0.3–1.2)
Total Protein: 4.1 g/dL — ABNORMAL LOW (ref 6.5–8.1)

## 2019-06-15 LAB — CBC WITH DIFFERENTIAL/PLATELET
Abs Immature Granulocytes: 0.19 10*3/uL — ABNORMAL HIGH (ref 0.00–0.07)
Basophils Absolute: 0 10*3/uL (ref 0.0–0.1)
Basophils Relative: 0 %
Eosinophils Absolute: 0.4 10*3/uL (ref 0.0–0.5)
Eosinophils Relative: 4 %
HCT: 30.4 % — ABNORMAL LOW (ref 36.0–46.0)
Hemoglobin: 9.5 g/dL — ABNORMAL LOW (ref 12.0–15.0)
Immature Granulocytes: 2 %
Lymphocytes Relative: 13 %
Lymphs Abs: 1.4 10*3/uL (ref 0.7–4.0)
MCH: 27.9 pg (ref 26.0–34.0)
MCHC: 31.3 g/dL (ref 30.0–36.0)
MCV: 89.1 fL (ref 80.0–100.0)
Monocytes Absolute: 0.6 10*3/uL (ref 0.1–1.0)
Monocytes Relative: 5 %
Neutro Abs: 8.2 10*3/uL — ABNORMAL HIGH (ref 1.7–7.7)
Neutrophils Relative %: 76 %
Platelets: 224 10*3/uL (ref 150–400)
RBC: 3.41 MIL/uL — ABNORMAL LOW (ref 3.87–5.11)
RDW: 17.5 % — ABNORMAL HIGH (ref 11.5–15.5)
WBC: 10.8 10*3/uL — ABNORMAL HIGH (ref 4.0–10.5)
nRBC: 0 % (ref 0.0–0.2)

## 2019-06-15 LAB — BPAM RBC
Blood Product Expiration Date: 202011152359
Blood Product Expiration Date: 202011202359
ISSUE DATE / TIME: 202010271231
ISSUE DATE / TIME: 202010271231
Unit Type and Rh: 6200
Unit Type and Rh: 6200

## 2019-06-15 LAB — GLUCOSE, CAPILLARY
Glucose-Capillary: 50 mg/dL — ABNORMAL LOW (ref 70–99)
Glucose-Capillary: 48 mg/dL — ABNORMAL LOW (ref 70–99)
Glucose-Capillary: 131 mg/dL — ABNORMAL HIGH (ref 70–99)
Glucose-Capillary: 116 mg/dL — ABNORMAL HIGH (ref 70–99)
Glucose-Capillary: 113 mg/dL — ABNORMAL HIGH (ref 70–99)
Glucose-Capillary: 137 mg/dL — ABNORMAL HIGH (ref 70–99)

## 2019-06-15 LAB — CBC
HCT: 29.5 % — ABNORMAL LOW (ref 36.0–46.0)
Hemoglobin: 9.2 g/dL — ABNORMAL LOW (ref 12.0–15.0)
MCH: 27.7 pg (ref 26.0–34.0)
MCHC: 31.2 g/dL (ref 30.0–36.0)
MCV: 88.9 fL (ref 80.0–100.0)
Platelets: 227 10*3/uL (ref 150–400)
RBC: 3.32 MIL/uL — ABNORMAL LOW (ref 3.87–5.11)
RDW: 17.5 % — ABNORMAL HIGH (ref 11.5–15.5)
WBC: 10.1 10*3/uL (ref 4.0–10.5)
nRBC: 0.2 % (ref 0.0–0.2)

## 2019-06-15 LAB — C-REACTIVE PROTEIN: CRP: 9.4 mg/dL — ABNORMAL HIGH (ref <1.0)

## 2019-06-15 LAB — FERRITIN: Ferritin: 311 ng/mL — ABNORMAL HIGH (ref 11–307)

## 2019-06-15 MED ORDER — DEXTROSE 50 % IV SOLN
25.0000 g | INTRAVENOUS | Status: AC
  Administered 2019-06-15: 09:00:00 50 mL via INTRAVENOUS

## 2019-06-15 MED ORDER — PANTOPRAZOLE SODIUM 40 MG PO TBEC
40.0000 mg | DELAYED_RELEASE_TABLET | Freq: Two times a day (BID) | ORAL | Status: DC
  Administered 2019-06-15 – 2019-06-17 (×4): 40 mg via ORAL
  Filled 2019-06-15 (×5): qty 1

## 2019-06-15 MED ORDER — APIXABAN 5 MG PO TABS
5.0000 mg | ORAL_TABLET | Freq: Two times a day (BID) | ORAL | Status: DC
  Administered 2019-06-15 – 2019-06-17 (×4): 5 mg via ORAL
  Filled 2019-06-15 (×4): qty 1

## 2019-06-15 MED ORDER — BOOST / RESOURCE BREEZE PO LIQD CUSTOM
1.0000 | Freq: Three times a day (TID) | ORAL | Status: DC
  Administered 2019-06-15 (×3): 1 via ORAL
  Filled 2019-06-15 (×12): qty 1

## 2019-06-15 MED ORDER — INSULIN GLARGINE 100 UNIT/ML ~~LOC~~ SOLN
8.0000 [IU] | Freq: Every day | SUBCUTANEOUS | Status: DC
  Administered 2019-06-16: 8 [IU] via SUBCUTANEOUS
  Filled 2019-06-15 (×2): qty 0.08

## 2019-06-15 MED ORDER — DEXTROSE 50 % IV SOLN
INTRAVENOUS | Status: AC
  Administered 2019-06-15: 50 mL via INTRAVENOUS
  Filled 2019-06-15: qty 50

## 2019-06-15 NOTE — Progress Notes (Signed)
Physical Therapy Treatment Patient Details Name: [name] MRN: [id number] DOB: 07-Apr-1951 Today's Date: 06/15/2019    History of Present Illness 68 y.o. 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 Constance Haw in [place]), A. fib on anticoagulation, CKD stage III, chronic diastolic heart failure-who presented to the hospital with acute metabolic encephalopathy, hypoglycemia and complicated UTI.  Patient was started on broad-spectrum antimicrobial therapy and subsequently admitted to the hospitalist service.  She was also found to have Covid 19+ on 10/21.  Due to concern for abdominal abscesses-a CT of the abdomen was done-this did not show any acute abnormalities.  Patient was subsequently transferred to [facility] on 10/22-as she was found to be COVID-19 positive.    PT Comments     Pt tolerating tx more today than previous and seemed to need less assist towards end of session. She is asking about going to rehab and what it would entail. Pt educated on need for increased mobility to increase activity tolerance also independence.     Follow Up Recommendations  SNF;Supervision/Assistance - 24 hour     Equipment Recommendations  None recommended by PT    Recommendations for Other Services       Precautions / Restrictions Precautions Precautions: Fall Precaution Comments: Abdominal wound, R thigh wound Restrictions Weight Bearing Restrictions: No Other Position/Activity Restrictions: Pt found in bed with feces covering entire body, she states she was trying to clean herself up hence removed her purewik and attempted to clean up. Pt has been educated on need to call staff for assist as do not want to get feces in wound. Pt verbalizes agreement.    Mobility  Bed Mobility Overal bed mobility: Needs Assistance Bed Mobility: Rolling Rolling: Max assist   Supine to sit: Max  assist;Total assist Sit to supine: Max assist      Transfers Overall transfer level: Needs assistance               General transfer comment: pt states she may be able to get up with walker, but then states her legs feel weak, did not attempt to stand for patients safety. will attempt to stand at next session with steady  Ambulation/Gait                 Stairs             Wheelchair Mobility    Modified Rankin (Stroke Patients Only)       Balance Overall balance assessment: Needs assistance Sitting-balance support: Feet unsupported;Bilateral upper extremity supported Sitting balance-Leahy Scale: Fair Sitting balance - Comments: no falling backwards able to sit without extrenal support today x approx 10 mins       Standing balance comment: did not attempt standing                            Cognition Arousal/Alertness: Lethargic Behavior During Therapy: Flat affect;Anxious Overall Cognitive Status: Impaired/Different from baseline Area of Impairment: Attention;Orientation;Memory;Awareness;Safety/judgement;Following commands;Problem solving                 Orientation Level: Disoriented to;Time     Following Commands: Follows one step commands with increased time Safety/Judgement: Decreased awareness of safety;Decreased awareness of deficits   Problem Solving: Slow processing;Requires verbal cues;Decreased initiation General Comments: has some word finding diffuclties, noted that is not as confused as initially thought. Pt has rationale as  to why she is covered in feces.      Exercises      General Comments General comments (skin integrity, edema, etc.): Upon arrival to rom pt alarms are sounding, pt is laying in bed covered in feces. initially seems to be confused but does better with increased time and activity. initially needed max a to roll in bed and cried out each time attempted but on latter attempts was able to give more  effort and also tolerate more movement. PT is on room air and sats generally in high 80s and 90s, at times HR increased to 120s but with cues able to calm down and vitals return to normal.      Pertinent Vitals/Pain Pain Assessment: Faces Faces Pain Scale: Hurts worst Pain Location: abdomen at surgical site, has exquisite tenderness with any touching of skin anywhere and movement Pain Descriptors / Indicators: Guarding;Tender;Crying;Grimacing;Moaning Pain Intervention(s): Limited activity within patient's tolerance;Monitored during session    Home Living                      Prior Function            PT Goals (current goals can now be found in the care plan section) Acute Rehab PT Goals Patient Stated Goal: to go to rehab PT Goal Formulation: With patient Time For Goal Achievement: 06/24/19 Potential to Achieve Goals: Fair Progress towards PT goals: Progressing toward goals    Frequency    Min 2X/week      PT Plan Current plan remains appropriate    Co-evaluation   Reason for Co-Treatment: Complexity of the patient's impairments (multi-system involvement);For patient/therapist safety PT goals addressed during session: Mobility/safety with mobility;Balance        AM-PAC PT "6 Clicks" Mobility   Outcome Measure  Help needed turning from your back to your side while in a flat bed without using bedrails?: A Lot Help needed moving from lying on your back to sitting on the side of a flat bed without using bedrails?: A Lot Help needed moving to and from a bed to a chair (including a wheelchair)?: Total Help needed standing up from a chair using your arms (e.g., wheelchair or bedside chair)?: Total Help needed to walk in hospital room?: Total Help needed climbing 3-5 steps with a railing? : Total 6 Click Score: 8    End of Session   Activity Tolerance: Treatment limited secondary to medical complications (Comment);Patient limited by pain;Patient limited by  fatigue Patient left: in bed;with call bell/phone within reach Nurse Communication: Mobility status;Need for lift equipment;Other (comment)(state pt found in) PT Visit Diagnosis: Unsteadiness on feet (R26.81);Other abnormalities of gait and mobility (R26.89);Muscle weakness (generalized) (M62.81)     Time: 7121-9758 PT Time Calculation (min) (ACUTE ONLY): 45 min  Charges:  $Therapeutic Activity: 23-37 mins                     Horald Chestnut, PT    Delford Field 06/15/2019, 12:39 PM

## 2019-06-15 NOTE — Progress Notes (Addendum)
PROGRESS NOTE                                                                                                                                                                                                             Patient Demographics:    [name], is a 68 y.o. female, DOB - Dec 27, 1950, RPR:[id number]  Outpatient Primary MD for the patient is Katrina Cha, MD   Admit date - 06/08/2019   LOS - 7  Chief Complaint  Patient presents with   Hypoglycemia       Brief Narrative: Patient is a 68 y.o. 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 Constance Haw in [place]), A. fib on anticoagulation, CKD stage III, chronic diastolic heart failure-who presented to the hospital with acute metabolic encephalopathy, hypoglycemia and complicated UTI.  Patient was started on broad-spectrum antimicrobial therapy and subsequently admitted to the hospitalist service. Due to concern for abdominal abscesses-a CT of the abdomen was done-this did not show any acute abnormalities.  Patient was subsequently transferred to [facility] on 10/22-as she was found to be COVID-19 positive.  On 10/23, she had a brief episode of slurred speech and a questionable right facial droop, she was transferred back to [facility] for neurology evaluation-a MRI of the brain did not show any acute infarct.  She was then transferred back to [facility] on 10/27 after neurology signed off  Patient also had a hospitalization from 9/21-10/7 at [facility] for hypoglycemia, encephalopathy-during that admission, EEG was negative-neurology evaluation was also completed.   Subjective:    [name] was found to be with some mild dysarthria this morning by RN.  She was noted to have a slight facial droop.  Per RN-patient failed  bedside swallow screen x2.   Assessment  & Plan :   Transient slurred speech/right facial droop on 10/23 likely secondary to TIA or ongoing lingering metabolic encephalopathy: She was transferred to [facility] for neurology evaluation-MRI brain did not show any acute infarct.  Echo showed preserved EF, carotid ultrasound did not show any significant stenosis.  Neurology has signed off-patient already is on Eliquis for atrial fibrillation.  I suspect that transient slurred speech/right facial droop was probably from encephalopathy rather than a ischemic  event.  Acute metabolic encephalopathy: She is relatively awake and alert this morning-suspect encephalopathy has improved.  Etiology thought to be multifactorial from hypoglycemia, UTI.  Covid 19 infection: Repeat chest x-ray on 10/27 continues to show no pneumonia-she remains on room air-her CRP is elevated but is likely secondary to chronic abdominal wound.  Do not think she requires steroids or remdesivir at this point.    Fever: afebrile  O2 requirements: On RA  COVID-19 Labs: Recent Labs    06/13/19 0755 06/14/19 0500 06/15/19 0415  FERRITIN 383* 269 311*  LDH 311* 299*  --   CRP 11.3* 10.5* 9.4*    Lab Results  Component Value Date   SARSCOV2NAA POSITIVE (A) 06/08/2019   [place] NEGATIVE 05/09/2019   [place] NEGATIVE 04/27/2019   [place] NEGATIVE 04/23/2019     COVID-19 Medications: Steroids:none Remdesivir:none Actemra:none Convalescent Plasma:None Research Studies:N/A  Other medications: Diuretics:Euvolemic-no need for lasix/but on Lasix to maintain negative balance Antibiotics: On broad-spectrum antibiotics for empiric treatment of UTI and suspicion for chronic abdominal wound infection (see below)  Prone/Incentive Spirometry: encouraged incentive spirometry use 3-4/hour.  DVT Prophylaxis  :  Eliquis  Complicated UTI: Urine culture positive for ESBL K pneumoniae-remains on meropenem x7  days.  She is afebrile-leukocytosis has resolved.  Chronic abdominal wound-history of pyoderma gangrenosum: CT of the abdomen and pelvis without any evidence of abscess or drainable collection-she has a history of prior wound infection-and required IV Invanz for 3 weeks approximately 1 month back or so.  Appreciate general surgery input-continue with empiric antimicrobial therapy with meropenem-and local wound care.  Follow with Dr. Constance Haw as scheduled upon discharge.  AKI on CKD stage III: AKI likely hemodynamically mediated-improving with supportive care.  Creatinine close to usual baseline  DM-2 (A1c 7.4 on 06/11/2019): Appears to have very brittle diabetes-hypoglycemic episode again this morning.  Decrease Lantus to 8 units-continue SSI.  Given the brittle nature of her diabetes-avoid strict glycemic control-allow some amount of permissive hyperglycemia.  CBG (last 3)  Recent Labs    06/15/19 0816 06/15/19 0909 06/15/19 0943  GLUCAP 50* 48* 131*    Chronic diastolic heart failure: Euvolemic on exam-follow weights/intake output and volume status-no indication for diuretics at this point  Chronic atrial fibrillation: Rate controlled with metoprolol-remains on Eliquis.  Cardizem on hold due to soft blood pressure.  Anemia: Has anemia secondary to chronic disease-acute illness.  Patient is s/p 2 units of PRBC on 10/27.  Follow hemoglobin closely.  There is no evidence of blood loss at this point.  Nutrition issues: Poor oral intake-have encouraged patient to increase her oral intake-continue maximal supplements.  We will watch for next few days-appreciate dietary/nutrition input on 10/27.  ABG:    Component Value Date/Time   PHART 7.434 08/17/2018 2030   PCO2ART 44.0 08/17/2018 2030   PO2ART 78.1 (L) 08/17/2018 2030   HCO3 35.2 (H) 05/16/2019 1135   TCO2 29 03/03/2019 1047   ACIDBASEDEF 1.7 01/10/2019 2119   O2SAT 45.5 05/16/2019 1135    Vent Settings: N/A  Condition  -Stable  Family Communication  :  Spouse updated over the phone on 10/28  Code Status :  Full Code  Diet :  Diet Order            DIET DYS 3 Room service appropriate? Yes; Fluid consistency: Thin  Diet effective now               Disposition Plan  :  Remain hospitalized  Barriers to discharge:  Consults  :  Neurology, general surgery  Procedures  : 10/22>> bedside debridement of abdominal wound by general surgery  Antibiotics  :    Anti-infectives (From admission, onward)   Start     Dose/Rate Route Frequency Ordered Stop   06/10/19 2200  vancomycin (VANCOCIN) 1,500 mg in sodium chloride 0.9 % 500 mL IVPB  Status:  Discontinued     1,500 mg 250 mL/hr over 120 Minutes Intravenous Every 48 hours 06/08/19 2007 06/10/19 1057   06/10/19 1000  meropenem (MERREM) 1 g in sodium chloride 0.9 % 100 mL IVPB     1 g 200 mL/hr over 30 Minutes Intravenous Every 8 hours 06/10/19 0848 06/15/19 2359   06/09/19 1000  meropenem (MERREM) 2 g in sodium chloride 0.9 % 100 mL IVPB  Status:  Discontinued     2 g 200 mL/hr over 30 Minutes Intravenous Every 12 hours 06/09/19 0855 06/10/19 0848   06/08/19 2100  ertapenem (INVANZ) 1,000 mg in sodium chloride 0.9 % 100 mL IVPB  Status:  Discontinued     1 g 200 mL/hr over 30 Minutes Intravenous Every 24 hours 06/08/19 2007 06/09/19 0855   06/08/19 1945  vancomycin (VANCOCIN) IVPB 1000 mg/200 mL premix  Status:  Discontinued     1,000 mg 200 mL/hr over 60 Minutes Intravenous  Once 06/08/19 1936 06/08/19 1942   06/08/19 1945  ceFEPIme (MAXIPIME) 2 g in sodium chloride 0.9 % 100 mL IVPB  Status:  Discontinued     2 g 200 mL/hr over 30 Minutes Intravenous  Once 06/08/19 1936 06/08/19 2135   06/08/19 1945  vancomycin (VANCOCIN) 1,500 mg in sodium chloride 0.9 % 500 mL IVPB     1,500 mg 250 mL/hr over 120 Minutes Intravenous  Once 06/08/19 1942 06/09/19 0017      Inpatient Medications  Scheduled Meds:  Chlorhexidine Gluconate Cloth  6 each  Topical Daily   feeding supplement (ENSURE ENLIVE)  237 mL Oral TID BM   feeding supplement (PRO-STAT SUGAR FREE 64)  30 mL Oral TID   folic acid  1 mg Oral Daily   furosemide  20 mg Intravenous Once   insulin aspart  0-5 Units Subcutaneous QHS   insulin aspart  0-9 Units Subcutaneous TID WC   insulin glargine  15 Units Subcutaneous Daily   metoprolol tartrate  50 mg Oral BID   multivitamin with minerals  1 tablet Oral Daily   pantoprazole (PROTONIX) IV  40 mg Intravenous Q12H   umeclidinium bromide  1 puff Inhalation Daily   vitamin C  1,000 mg Oral Daily   zinc sulfate  220 mg Oral Daily   Continuous Infusions:  meropenem (MERREM) IV 1 g (06/15/19 0944)   PRN Meds:.acetaminophen, albuterol, fluticasone, lidocaine, metoprolol tartrate, morphine injection, ondansetron **OR** ondansetron (ZOFRAN) IV, polyethylene glycol   Time Spent in minutes  25  See all Orders from today for further details  Oren Binet M.D on 06/15/2019 at 11:11 AM  To page go to [email or web address] - use universal password  [facility] -  Office  [phone]    Objective:   Vitals:   06/14/19 1944 06/14/19 2128 06/15/19 0412 06/15/19 0800  BP: 100/85 (!) 86/58 (!) 84/67   Pulse: 89     Resp: 16  14   Temp: 98 F (36.7 C)  98.4 F (36.9 C) 98 F (36.7 C)  TempSrc: Oral  Oral   SpO2: 100%  100%   Weight:      Height:  Wt Readings from Last 3 Encounters:  06/12/19 82.9 kg  06/11/19 77.1 kg  06/08/19 78.2 kg     Intake/Output Summary (Last 24 hours) at 06/15/2019 1111 Last data filed at 06/15/2019 9622 Gross per 24 hour  Intake 1944.75 ml  Output 300 ml  Net 1644.75 ml     Physical Exam Gen Exam:Alert awake-not in any distress HEENT:atraumatic, normocephalic Chest: B/L clear to auscultation anteriorly CVS:S1S2 regular Abdomen:soft non tender, non distended Extremities:no edema Neurology: Non focal-but with generalized weakness-able to move all 4  extremities. Skin: no rash  Data Review:    CBC  Recent Labs  Lab 06/11/19 0834 06/12/19 0549 06/13/19 0755 06/14/19 0500 06/14/19 2300 06/15/19 0415  WBC 15.3* 10.3 8.7 9.6 10.8* 10.1  HGB 7.5* 7.4* 7.2* 6.8* 9.5* 9.2*  HCT 24.5* 24.1* 24.6* 22.9* 30.4* 29.5*  PLT 294 275 264 246 224 227  MCV 86.3 85.2 87.2 88.8 89.1 88.9  MCH 26.4 26.1 25.5* 26.4 27.9 27.7  MCHC 30.6 30.7 29.3* 29.7* 31.3 31.2  RDW 18.7* 18.9* 18.7* 18.6* 17.5* 17.5*  LYMPHSABS 1.1 1.2 1.6 1.4 1.4  --   MONOABS 0.6 0.6 0.7 0.7 0.6  --   EOSABS 0.0 0.1 0.2 0.3 0.4  --   BASOSABS 0.0 0.0 0.0 0.0 0.0  --     Chemistries  Recent Labs  Lab 06/11/19 0834 06/12/19 0549 06/13/19 0755 06/14/19 0500 06/15/19 0415  NA 140 140 143 139 138  K 3.3* 4.1 4.0 3.9 3.6  CL 113* 113* 115* 112* 110  CO2 19* 21* 21* 21* 22  GLUCOSE 289* 225* 174* 312* 71  BUN 30* 24* 24* 28* 26*  CREATININE 0.83 0.79 0.90 0.80 0.65  CALCIUM 7.5* 7.6* 7.4* 7.3* 7.2*  MG 2.0 1.8 1.8 1.5*  --   AST 22 31 49* 21 17  ALT 20 23 28 27 26   ALKPHOS 123 128* 115 112 98  BILITOT 0.4 0.4 0.4 0.6 0.4   ------------------------------------------------------------------------------------------------------------------ No results for input(s): CHOL, HDL, LDLCALC, TRIG, CHOLHDL, LDLDIRECT in the last 72 hours.  Lab Results  Component Value Date   HGBA1C 7.4 (H) 06/11/2019   ------------------------------------------------------------------------------------------------------------------ No results for input(s): TSH, T4TOTAL, T3FREE, THYROIDAB in the last 72 hours.  Invalid input(s): FREET3 ------------------------------------------------------------------------------------------------------------------ Recent Labs    06/14/19 0500 06/15/19 0415  FERRITIN 269 311*    Coagulation profile Recent Labs  Lab 06/08/19 1850  INR 2.2*    No results for input(s): DDIMER in the last 72 hours.  Cardiac Enzymes No results for input(s):  CKMB, TROPONINI, MYOGLOBIN in the last 168 hours.  Invalid input(s): CK ------------------------------------------------------------------------------------------------------------------    Component Value Date/Time   BNP 461.0 (H) 05/09/2019 1750    Micro Results Recent Results (from the past 240 hour(s))  Culture, Urine     Status: Abnormal   Collection Time: 06/07/19  1:44 PM   Specimen: Urine, Clean Catch  Result Value Ref Range Status   Specimen Description   Final    URINE, CLEAN CATCH Performed at [facility], [address], [place], [place] [address]    Special Requests   Final    NONE Performed at [facility], [address], [place],  [address]    Culture (A)  Final    >=100,000 COLONIES/mL KLEBSIELLA PNEUMONIAE Confirmed Extended Spectrum Beta-Lactamase Producer (ESBL).  In bloodstream infections from ESBL organisms, carbapenems are preferred over piperacillin/tazobactam. They are shown to have a lower risk of mortality.    Report Status 06/11/2019 FINAL  Final   Organism ID, Bacteria KLEBSIELLA PNEUMONIAE (A)  Final      Susceptibility   Klebsiella pneumoniae - MIC*    AMPICILLIN >=32 RESISTANT Resistant     CEFAZOLIN >=64 RESISTANT Resistant     CEFTRIAXONE >=64 RESISTANT Resistant     CIPROFLOXACIN >=4 RESISTANT Resistant     GENTAMICIN >=16 RESISTANT Resistant     IMIPENEM <=0.25 SENSITIVE Sensitive     NITROFURANTOIN 256 RESISTANT Resistant     TRIMETH/SULFA 40 SENSITIVE Sensitive     AMPICILLIN/SULBACTAM >=32 RESISTANT Resistant     PIP/TAZO >=128 RESISTANT Resistant     Extended ESBL POSITIVE Resistant     * >=100,000 COLONIES/mL KLEBSIELLA PNEUMONIAE  SARS Coronavirus 2 by RT PCR (hospital order, performed in [place] hospital lab)     Status: Abnormal   Collection Time: 06/08/19  2:10 PM  Result Value Ref Range Status   SARS Coronavirus 2 POSITIVE (A) NEGATIVE Final    Comment: RESULT CALLED TO, READ BACK BY AND VERIFIED WITH: JONES,V  @1532  BY MATTHEWS, B 10.21.20 (NOTE) If result is NEGATIVE SARS-CoV-2 target nucleic acids are NOT DETECTED. The SARS-CoV-2 RNA is generally detectable in upper and lower  respiratory specimens during the acute phase of infection. The lowest  concentration of SARS-CoV-2 viral copies this assay can detect is 250  copies / mL. A negative result does not preclude SARS-CoV-2 infection  and should not be used as the sole basis for treatment or other  patient management decisions.  A negative result may occur with  improper specimen collection / handling, submission of specimen other  than nasopharyngeal swab, presence of viral mutation(s) within the  areas targeted by this assay, and inadequate number of viral copies  (<250 copies / mL). A negative result must be combined with clinical  observations, patient history, and epidemiological information. If result is POSITIVE SARS-CoV-2 target nucleic acids are DETECTED.  The SARS-CoV-2 RNA is generally detectable in upper and lower  respiratory specimens during the acute phase of infection.  Positive  results are indicative of active infection with SARS-CoV-2.  Clinical  correlation with patient history and other diagnostic information is  necessary to determine patient infection status.  Positive results do  not rule out bacterial infection or co-infection with other viruses. If result is PRESUMPTIVE POSTIVE SARS-CoV-2 nucleic acids MAY BE PRESENT.   A presumptive positive result was obtained on the submitted specimen  and confirmed on repeat testing.  While 2019 novel coronavirus  (SARS-CoV-2) nucleic acids may be present in the submitted sample  additional confirmatory testing may be necessary for epidemiological  and / or clinical management purposes  to differentiate between  SARS-CoV-2 and other Sarbecovirus currently known to infect humans.  If clinically indicated additional testing with an alternate test  methodology [phone])  is  advised. The SARS-CoV-2 RNA is generally  detectable in upper and lower respiratory specimens during the acute  phase of infection. The expected result is Negative. Fact Sheet for Patients:  StrictlyIdeas.no Fact Sheet for Healthcare Providers: BankingDealers.co.za This test is not yet approved or cleared by the [place] FDA and has been authorized for detection and/or diagnosis of SARS-CoV-2 by FDA under an Emergency Use Authorization (EUA).  This EUA will remain in effect (meaning this test can be used) for the duration of the COVID-19 declaration under Section 564(b)(1) of the Act, 21 U.S.C. section 360bbb-3(b)(1), unless the authorization is terminated or revoked sooner. Performed at [facility], [address], [place], [place]  [address]   Blood culture (routine x 2)     Status: None   Collection Time: 06/08/19  6:50 PM   Specimen: BLOOD  Result Value Ref Range Status   Specimen Description BLOOD PICC LINE  Final   Special Requests   Final    Blood Culture adequate volume BOTTLES DRAWN AEROBIC AND ANAEROBIC   Culture   Final    NO GROWTH 5 DAYS Performed at [facility], [address], [place], [place] [address]    Report Status 06/13/2019 FINAL  Final  Urine culture     Status: Abnormal (Preliminary result)   Collection Time: 06/08/19  6:50 PM   Specimen: Urine, Random  Result Value Ref Range Status   Specimen Description   Final    URINE, RANDOM Performed at [facility], [address] [place], [place] [address]    Special Requests   Final    NONE Performed at [facility], [address], [place], [place] [address]    Culture (A)  Final    70,000 COLONIES/mL KLEBSIELLA PNEUMONIAE REPEATING SUSCEPTIBILITIES TO FOLLOW CULTURE REINCUBATED FOR BETTER GROWTH Performed at [facility], [place] [address] [place], [place] [address]    Report Status PENDING  Incomplete  Blood culture (routine x 2)     Status: None    Collection Time: 06/08/19  7:02 PM   Specimen: BLOOD  Result Value Ref Range Status   Specimen Description BLOOD PICC LINE  Final   Special Requests Blood Culture adequate volume  Final   Culture   Final    NO GROWTH 5 DAYS Performed at [facility], [address] [place], [place] [address]    Report Status 06/13/2019 FINAL  Final    Radiology Reports Ct Abdomen Pelvis Wo Contrast  Result Date: 06/09/2019 CLINICAL DATA:  Chronic draining abdominal wound, pyoderma gangrenosum, leukocytosis 29, altered mental status, sepsis. EXAM: CT ABDOMEN AND PELVIS WITHOUT CONTRAST TECHNIQUE: Multidetector CT imaging of the abdomen and pelvis was performed following the standard protocol without IV contrast. COMPARISON:  Contrast-enhanced CT 04/22/2019, additional priors. FINDINGS: Lower chest: Clustered nodular ground-glass opacities in the right lower lobe with associated bronchial thickening. Findings are new from prior exam. Mild left lower lobe bronchial thickening. Cardiomegaly with coronary artery calcifications. Mitral annulus calcifications. Hepatobiliary: Stable biliary dilatation postcholecystectomy. No evidence of focal hepatic abnormality on noncontrast exam. Pancreas: Parenchymal atrophy. No ductal dilatation or inflammation. Spleen: Calcified granuloma. Normal in size. Adrenals/Urinary Tract: No adrenal nodule. Mild bilateral renal parenchymal thinning. No hydronephrosis. Mild bilateral renal cortical scarring. Small hypodense lesion in the left kidney on prior exam not as well-defined in the absence of IV contrast. Partially distended urinary bladder. Mild wall thickening inferiorly. Stomach/Bowel: Bowel evaluation limited in the absence of enteric contrast. Small hiatal hernia. Small duodenal diverticulum. No bowel obstruction or inflammatory change. Appendix not confidently visualized. Mild distal colonic diverticulosis without diverticulitis. Vascular/Lymphatic: Advanced aortic and branch  atherosclerosis. No aortic aneurysm. No enlarged lymph nodes in the abdomen or pelvis. Reproductive: Uterus is surgically absent. Right adnexal ovarian cyst measures 4.2 cm, not significantly changed allowing for differences in caliper placement. Other: Debridement of the right lower quadrant subcutaneous soft tissues. Previous fluid collection in the subcutaneous fat is no longer seen. No residual or recurrent fluid collection. No significant phlegm a tori change. No tracking soft tissue gas to suggest acute infection. No free fluid or free air in the abdomen. Small fat containing umbilical hernia. Musculoskeletal: There are no acute or suspicious  osseous abnormalities. Bones are under mineralized. Mild diffuse fatty atrophy of included musculature. IMPRESSION: 1. Resolved fluid collection in the subcutaneous tissues of the lower abdominal wall. Residual skin irregularity but no soft tissue thickening or inflammatory change. No recurrent or new abscess. 2. Clustered nodular ground-glass opacities in the right lower lobe with associated bronchial thickening, suspicious for aspiration. Or pneumonia also considered. 3. Mild bladder wall thickening inferiorly, can be seen with urinary tract infection. Recommend correlation with urinalysis. 4. Colonic diverticulosis without diverticulitis. Aortic Atherosclerosis (ICD10-I70.0). Electronically Signed   By: Keith Rake M.D.   On: 06/09/2019 00:09   Ct Head Wo Contrast  Result Date: 05/17/2019 CLINICAL DATA:  Altered mental status EXAM: CT HEAD WITHOUT CONTRAST TECHNIQUE: Contiguous axial images were obtained from the base of the skull through the vertex without intravenous contrast. COMPARISON:  None. FINDINGS: Brain: Examination is significantly limited by motion artifact throughout. No evidence of acute infarction, hemorrhage, hydrocephalus, extra-axial collection or mass lesion/mass effect. Mild periventricular white matter hypodensity. Vascular: No hyperdense  vessel or unexpected calcification. Skull: Normal. Negative for fracture or focal lesion. Sinuses/Orbits: No acute finding. Other: None. IMPRESSION: Examination is significantly limited by motion artifact throughout. Within this limitation, no acute intracranial pathology. Small-vessel white matter disease. Electronically Signed   By: Eddie Candle M.D.   On: 05/17/2019 11:48   Dg Chest Port 1 View  Result Date: 06/14/2019 CLINICAL DATA:  Hypoxia. EXAM: PORTABLE CHEST 1 VIEW COMPARISON:  Chest x-ray dated June 10, 2019. FINDINGS: The heart size and mediastinal contours are within normal limits. Atherosclerotic calcification of the aortic arch. Normal pulmonary vascularity. Mild bibasilar atelectasis. No focal consolidation, pleural effusion, or pneumothorax. No acute osseous abnormality. IMPRESSION: Mild bibasilar atelectasis. Electronically Signed   By: Titus Dubin M.D.   On: 06/14/2019 17:45   Dg Chest Portable 1 View  Result Date: 06/08/2019 CLINICAL DATA:  Altered mental status. COVID. EXAM: PORTABLE CHEST 1 VIEW COMPARISON:  Chest x-ray dated 05/15/2019 FINDINGS: The heart size and pulmonary vascularity are normal the lungs are clear. No effusions. No acute bone abnormality. Aortic atherosclerosis. IMPRESSION: 1. No active cardiopulmonary disease. 2. Aortic atherosclerosis. Electronically Signed   By: Lorriane Shire M.D.   On: 06/08/2019 19:43   Dg Chest Port 1v Same Day  Result Date: 06/10/2019 CLINICAL DATA:  Shortness of breath EXAM: PORTABLE CHEST 1 VIEW COMPARISON:  06/08/2019 FINDINGS: Heart is normal size. No confluent airspace opacities or effusions. No acute bony abnormality. IMPRESSION: No acute cardiopulmonary disease. Electronically Signed   By: Rolm Baptise M.D.   On: 06/10/2019 08:40   Ct Head Code Stroke Wo Contrast  Result Date: 06/10/2019 CLINICAL DATA:  Code stroke. Subacute neuro deficits. Slurred speech EXAM: CT HEAD WITHOUT CONTRAST TECHNIQUE: Contiguous axial  images were obtained from the base of the skull through the vertex without intravenous contrast. COMPARISON:  05/17/2019 FINDINGS: Brain: No evidence of acute infarction, hemorrhage, hydrocephalus, extra-axial collection or mass lesion/mass effect. 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 No hyperdense vessel. Skull: Normal. Negative for fracture or focal lesion. Sinuses/Orbits: No acute finding. Other: These results were communicated to [facility] 10/23/2020by text page via the [facility] messaging system. ASPECTS [facility] Stroke Program Early CT Score) - Ganglionic level infarction (caudate, lentiform nuclei, internal capsule, insula, M1-M3 cortex): 7 - Supraganglionic infarction (M4-M6 cortex): 3 Total score (0-10 with 10 being normal): 10 IMPRESSION: 1. No acute or interval finding. ASPECTS is 10. 2.  Atherosclerotic calcification. Electronically Signed   By: Monte Fantasia M.D.   On: 06/10/2019 09:00   Vas US Carotid (at [place] Only)  Result Date: 06/13/2019 Carotid Arterial Duplex Study Indications:       CVA. Limitations        Today's exam was limited due to lighting in the room. Comparison Study:  No prior. Performing Technologist: Oda Cogan RDMS, RVT  Examination Guidelines: A complete evaluation includes B-mode imaging, spectral Doppler, color Doppler, and power Doppler as needed of all accessible portions of each vessel. Bilateral testing is considered an integral part of a complete examination. Limited examinations for reoccurring indications may be performed as noted.  Right Carotid Findings: +----------+--------+--------+--------+------------------+--------+             PSV cm/s EDV cm/s Stenosis Plaque Description Comments  +----------+--------+--------+--------+------------------+--------+  CCA Prox   85       19                                              +----------+--------+--------+--------+------------------+--------+  CCA Distal 86       17                                             +----------+--------+--------+--------+------------------+--------+  ICA Prox   50       20       1-39%    heterogenous                 +----------+--------+--------+--------+------------------+--------+  ICA Distal 57       17                                             +----------+--------+--------+--------+------------------+--------+  ECA        73       11                                             +----------+--------+--------+--------+------------------+--------+ +----------+--------+-------+------------+-------------------+             PSV cm/s EDV cms Describe     Arm Pressure (mmHG)  +----------+--------+-------+------------+-------------------+  Subclavian                  Not assessed                      +----------+--------+-------+------------+-------------------+ +---------+--------+--------+------------+  Vertebral PSV cm/s EDV cm/s Not assessed  +---------+--------+--------+------------+  Left Carotid Findings: +----------+--------+--------+--------+------------------+--------+             PSV cm/s EDV cm/s Stenosis Plaque Description Comments  +----------+--------+--------+--------+------------------+--------+  CCA Prox   98                                                      +----------+--------+--------+--------+------------------+--------+  CCA Distal 65                                                      +----------+--------+--------+--------+------------------+--------+  ICA Prox   68       15       1-39%    heterogenous                 +----------+--------+--------+--------+------------------+--------+  ICA Distal 81       24                                             +----------+--------+--------+--------+------------------+--------+  ECA        71       7                                               +----------+--------+--------+--------+------------------+--------+ +----------+--------+--------+--------------+-------------------+             PSV cm/s EDV cm/s Describe       Arm Pressure (mmHG)  +----------+--------+--------+--------------+-------------------+  Subclavian                   Not identified                      +----------+--------+--------+--------------+-------------------+ +---------+--------+--------+------------+  Vertebral PSV cm/s EDV cm/s Not assessed  +---------+--------+--------+------------+  Summary: Right Carotid: Velocities in the right ICA are consistent with a 1-39% stenosis. Left Carotid: Velocities in the left ICA are consistent with a 1-39% stenosis. Vertebrals: Bilateral vertebral arteries were not visualized. *See table(s) above for measurements and observations.  Electronically signed by Antony Contras MD on 06/13/2019 at 3:16:33 PM.    Final    Mr Brain Wo Contrast (charm Study)  Result Date: 06/11/2019 CLINICAL DATA:  Focal neuro deficit. COVID-19 positive. Slurred speech with right-sided weakness. EXAM: MRI HEAD WITHOUT CONTRAST TECHNIQUE: Multiplanar, multiecho pulse sequences of the brain and surrounding structures were obtained without intravenous contrast. COMPARISON:  CT head 06/10/2019 FINDINGS: Brain: Negative for acute infarct Generalized atrophy. Chronic microvascular ischemic changes in the white matter and pons. Negative for hemorrhage or mass. No midline shift. Vascular: Normal arterial flow voids Skull and upper cervical spine: Negative Sinuses/Orbits: Negative Other: None IMPRESSION: Negative for acute infarct Atrophy and moderate chronic microvascular ischemia. Electronically Signed   By: Franchot Gallo M.D.   On: 06/11/2019 11:52

## 2019-06-15 NOTE — Progress Notes (Signed)
Occupational Therapy Treatment Patient Details Name: [name] MRN: [id number] DOB: 1950/09/01 Today's Date: 06/15/2019    History of present illness 68 y.o. 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 Constance Haw in [place]), A. fib on anticoagulation, CKD stage III, chronic diastolic heart failure-who presented to the hospital with acute metabolic encephalopathy, hypoglycemia and complicated UTI.  Patient was started on broad-spectrum antimicrobial therapy and subsequently admitted to the hospitalist service.  She was also found to have Covid 19+ on 10/21.  Due to concern for abdominal abscesses-a CT of the abdomen was done-this did not show any acute abnormalities.  Patient was subsequently transferred to [facility] on 10/22-as she was found to be COVID-19 positive.   OT comments  Pt with slow progress towards OT goals, presents supine in bed and reports needing assist with pericare as pt with incontinent bowel/bladder. Pt requiring totalA for all peri-care; required significant amount of time and effort to perform bed mobility due to pain with any movement and weakness/fatigue with activity, requiring rest breaks. Pt requiring max-totalA for bed mobility (ultimately utilized +2 assist). Pt also with continued confusion/delayed processing throughout. Continue to recommend SNF at time of discharge. Will follow acutely.   Follow Up Recommendations  SNF;Supervision/Assistance - 24 hour    Equipment Recommendations  None recommended by OT          Precautions / Restrictions Precautions Precautions: Fall Precaution Comments: Abdominal wound, R thigh wound Restrictions Weight Bearing Restrictions: No       Mobility Bed Mobility Overal bed mobility: Needs Assistance Bed Mobility: Rolling Rolling: Max assist;Total assist;+2 for physical assistance         General bed  mobility comments: limited due to pain/crying out with movements and attempts for therapist to assist, pt with good initiation of movements but requires assist to fully carry out   Transfers                 General transfer comment: deferred due to safety concerns, will need +2    Balance                                           ADL either performed or assessed with clinical judgement   ADL Overall ADL's : Needs assistance/impaired                 Upper Body Dressing : Moderate assistance;Bed level Upper Body Dressing Details (indicate cue type and reason): donning new gown         Toileting- Clothing Manipulation and Hygiene: Total assistance;+2 for physical assistance;+2 for safety/equipment Toileting - Clothing Manipulation Details (indicate cue type and reason): totalA at bed level as pt with incontinence upon arrival; required +2 assist as pt with significant pain and difficulty rolling towards L/R     Functional mobility during ADLs: Maximal assistance;Total assistance(bed mobility) General ADL Comments: pt with significant pain with mobility, weakness, decreased functional status; required significant time for ADL/bed mobility this session     Vision       Perception     Praxis      Cognition Arousal/Alertness: Awake/alert Behavior During Therapy: Anxious Overall Cognitive Status: Impaired/Different from baseline Area of Impairment: Attention;Orientation;Memory;Awareness;Safety/judgement;Following commands;Problem solving  Orientation Level: Disoriented to;Time Current Attention Level: Sustained Memory: Decreased short-term memory Following Commands: Follows one step commands with increased time Safety/Judgement: Decreased awareness of safety;Decreased awareness of deficits Awareness: Emergent Problem Solving: Slow processing;Requires verbal cues;Decreased initiation;Requires tactile cues General Comments:  pt with delayed processing and notable confusion, but when engaging about topics of interest to pt she has good recall (able to tell me information about country artist and their recent engagement). pt intermittently down about current situation/status        Exercises     Shoulder Instructions       General Comments      Pertinent Vitals/ Pain       Pain Assessment: Faces Faces Pain Scale: Hurts worst Pain Location: bil LEs, abdomen, generalized with movement, at times touching of pt's skin appears painful Pain Descriptors / Indicators: Crying;Discomfort;Grimacing;Tender;Moaning Pain Intervention(s): Limited activity within patient's tolerance;Monitored during session;Repositioned  Home Living                                          Prior Functioning/Environment              Frequency  Min 2X/week        Progress Toward Goals  OT Goals(current goals can now be found in the care plan section)  Progress towards OT goals: OT to reassess next treatment  Acute Rehab OT Goals Patient Stated Goal: to go to rehab OT Goal Formulation: With patient Time For Goal Achievement: 06/24/19 Potential to Achieve Goals: Good ADL Goals Pt Will Perform Eating: with modified independence;sitting Pt Will Perform Grooming: with set-up;sitting Pt Will Perform Upper Body Bathing: with set-up;sitting Additional ADL Goal #1: Pt will demonstrate selective attention during ADL task in nondistracting environment  Plan Discharge plan remains appropriate    Co-evaluation                 AM-PAC OT "6 Clicks" Daily Activity     Outcome Measure   Help from another person eating meals?: A Little Help from another person taking care of personal grooming?: A Little Help from another person toileting, which includes using toliet, bedpan, or urinal?: Total Help from another person bathing (including washing, rinsing, drying)?: A Lot Help from another person to put on  and taking off regular upper body clothing?: A Lot Help from another person to put on and taking off regular lower body clothing?: Total 6 Click Score: 12    End of Session    OT Visit Diagnosis: Unsteadiness on feet (R26.81);Other abnormalities of gait and mobility (R26.89);Muscle weakness (generalized) (M62.81);Other symptoms and signs involving cognitive function;Pain Pain - part of body: (abdomen, bil LEs)   Activity Tolerance Patient tolerated treatment well   Patient Left in bed;with call bell/phone within reach;with bed alarm set;with nursing/sitter in room   Nurse Communication Mobility status;Need for lift equipment        Time: 2395-3202 OT Time Calculation (min): 65 min  Charges: OT General Charges $OT Visit: 1 Visit OT Treatments $Self Care/Home Management : 53-67 mins  Lou Cal, OT Supplemental Rehabilitation Services Pager [phone] Office [phone]   Raymondo Band 06/15/2019, 4:52 PM

## 2019-06-15 NOTE — Progress Notes (Signed)
Ok to resume her apixaban and change IV protonix to PO per Dr Sloan Leiter. Hgb stable for the past several days.  Apixaban 4m PO BID age<80, wt>60kg, scr<1.5 Protonix 432mPO BID  MiOnnie BoerPharmD, [email or web address], CPP Infectious Disease Pharmacist 06/15/2019 1:34 PM

## 2019-06-16 ENCOUNTER — Other Ambulatory Visit: Payer: Self-pay

## 2019-06-16 LAB — GLUCOSE, CAPILLARY
Glucose-Capillary: 84 mg/dL (ref 70–99)
Glucose-Capillary: 111 mg/dL — ABNORMAL HIGH (ref 70–99)
Glucose-Capillary: 64 mg/dL — ABNORMAL LOW (ref 70–99)
Glucose-Capillary: 68 mg/dL — ABNORMAL LOW (ref 70–99)
Glucose-Capillary: 52 mg/dL — ABNORMAL LOW (ref 70–99)
Glucose-Capillary: 93 mg/dL (ref 70–99)
Glucose-Capillary: 171 mg/dL — ABNORMAL HIGH (ref 70–99)
Glucose-Capillary: 151 mg/dL — ABNORMAL HIGH (ref 70–99)

## 2019-06-16 LAB — COMPREHENSIVE METABOLIC PANEL
ALT: 26 U/L (ref 0–44)
AST: 16 U/L (ref 15–41)
Albumin: 1.3 g/dL — ABNORMAL LOW (ref 3.5–5.0)
Alkaline Phosphatase: 105 U/L (ref 38–126)
Anion gap: 6 (ref 5–15)
BUN: 22 mg/dL (ref 8–23)
CO2: 22 mmol/L (ref 22–32)
Calcium: 7 mg/dL — ABNORMAL LOW (ref 8.9–10.3)
Chloride: 108 mmol/L (ref 98–111)
Creatinine, Ser: 0.56 mg/dL (ref 0.44–1.00)
GFR calc Af Amer: >60 mL/min (ref >60)
GFR calc non Af Amer: >60 mL/min (ref >60)
Glucose, Bld: 121 mg/dL — ABNORMAL HIGH (ref 70–99)
Potassium: 3.5 mmol/L (ref 3.5–5.1)
Sodium: 136 mmol/L (ref 135–145)
Total Bilirubin: 0.3 mg/dL (ref 0.3–1.2)
Total Protein: 4.2 g/dL — ABNORMAL LOW (ref 6.5–8.1)

## 2019-06-16 LAB — CBC
HCT: 30.2 % — ABNORMAL LOW (ref 36.0–46.0)
Hemoglobin: 9.5 g/dL — ABNORMAL LOW (ref 12.0–15.0)
MCH: 27.9 pg (ref 26.0–34.0)
MCHC: 31.5 g/dL (ref 30.0–36.0)
MCV: 88.6 fL (ref 80.0–100.0)
Platelets: 229 10*3/uL (ref 150–400)
RBC: 3.41 MIL/uL — ABNORMAL LOW (ref 3.87–5.11)
RDW: 18.2 % — ABNORMAL HIGH (ref 11.5–15.5)
WBC: 6.9 10*3/uL (ref 4.0–10.5)
nRBC: 0 % (ref 0.0–0.2)

## 2019-06-16 LAB — FERRITIN: Ferritin: 326 ng/mL — ABNORMAL HIGH (ref 11–307)

## 2019-06-16 LAB — C-REACTIVE PROTEIN: CRP: 9.4 mg/dL — ABNORMAL HIGH (ref <1.0)

## 2019-06-16 MED ORDER — DEXTROSE 50 % IV SOLN
INTRAVENOUS | Status: AC
  Administered 2019-06-16: 12.5 g via INTRAVENOUS
  Filled 2019-06-16: qty 50

## 2019-06-16 MED ORDER — DILTIAZEM HCL 30 MG PO TABS
30.0000 mg | ORAL_TABLET | Freq: Three times a day (TID) | ORAL | Status: DC
  Administered 2019-06-16 – 2019-06-17 (×3): 30 mg via ORAL
  Filled 2019-06-16 (×8): qty 1

## 2019-06-16 MED ORDER — DEXTROSE 50 % IV SOLN
12.5000 g | INTRAVENOUS | Status: AC
  Administered 2019-06-16: 18:00:00 12.5 g via INTRAVENOUS

## 2019-06-16 NOTE — Progress Notes (Signed)
PROGRESS NOTE                                                                                                                                                                                                             Patient Demographics:    Seleen Walter, is a 68 y.o. female, DOB - 01-01-1951, JEH:[id number]  Outpatient Primary MD for the patient is [name], MD   Admit date - 06/08/2019   LOS - 8  Chief Complaint  Patient presents with   Hypoglycemia       Brief Narrative: Patient is a 68 y.o. 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 Constance Haw in [place]), A. fib on anticoagulation, CKD stage III, chronic diastolic heart failure-who presented to the hospital with acute metabolic encephalopathy, hypoglycemia and complicated UTI.  Patient was started on broad-spectrum antimicrobial therapy and subsequently admitted to the hospitalist service. Due to concern for abdominal abscesses-a CT of the abdomen was done-this did not show any acute abnormalities.  Patient was subsequently transferred to [facility] on 10/22-as she was found to be COVID-19 positive.  On 10/23, she had a brief episode of slurred speech and a questionable right facial droop, she was transferred back to [facility] for neurology evaluation-a MRI of the brain did not show any acute infarct.  She was then transferred back to [facility] on 10/27 after neurology signed off  Patient also had a hospitalization from 9/21-10/7 at [facility] for hypoglycemia, encephalopathy-during that admission, EEG was negative-neurology evaluation was also completed.   Subjective:    [name] feels better this morning-she denied any nausea, vomiting or diarrhea.  Per RN no major events overnight.  I was later informed that  patient had a brief spell of A. fib with RVR.   Assessment  & Plan :   Transient slurred speech/right facial droop on 10/23 likely secondary to TIA or ongoing lingering metabolic encephalopathy: She underwent extensive work-up including neurology evaluation-MRI brain did not show any acute infarct.  TTE with preserved EF.  Carotid ultrasound without any significant stenosis.  Continue Eliquis.  Neurology has signed off.  It is now felt that the transient slurred speech/right sided facial droop was probably from metabolic encephalopathy.    Acute  metabolic encephalopathy: She is relatively awake and alert this morning-suspect encephalopathy has improved.  Etiology thought to be multifactorial from hypoglycemia, UTI.  Covid 19 infection: Repeat chest x-ray on 10/27 continues to show no pneumonia-she remains on room air-her CRP is elevated but is likely secondary to chronic abdominal wound.  Do not think she requires steroids or remdesivir at this point.    Fever: afebrile  O2 requirements: On RA  COVID-19 Labs: Recent Labs    06/14/19 0500 06/15/19 0415 06/16/19 0451  FERRITIN 269 311* 326*  LDH 299*  --   --   CRP 10.5* 9.4* 9.4*    Lab Results  Component Value Date   SARSCOV2NAA POSITIVE (A) 06/08/2019   SARSCOV2NAA NEGATIVE 05/09/2019   [place] NEGATIVE 04/27/2019   [place] NEGATIVE 04/23/2019     COVID-19 Medications: Steroids:none Remdesivir:none Actemra:none Convalescent Plasma:None Research Studies:N/A  Other medications: Diuretics:Euvolemic-no need for lasix Antibiotics: On broad-spectrum antibiotics for empiric treatment of UTI and suspicion for chronic abdominal wound infection (see below)  Prone/Incentive Spirometry: encouraged incentive spirometry use 3-4/hour.  DVT Prophylaxis  :  Eliquis  Complicated UTI: Urine culture positive for ESBL K pneumoniae-completed meropenem x7 days on 10/29.  She is afebrile-leukocytosis has resolved.  Chronic  abdominal wound-history of pyoderma gangrenosum: CT of the abdomen and pelvis without any evidence of abscess or drainable collection-she has a history of prior wound infection-and required IV Invanz for 3 weeks approximately 1 month back or so.  Appreciate general surgery input-continue with empiric antimicrobial therapy with meropenem-and local wound care.  Follow with Dr. Constance Haw as scheduled upon discharge.  AKI on CKD stage III: AKI likely hemodynamically mediated-improving with supportive care.  Creatinine close to usual baseline  DM-2 (A1c 7.4 on 06/11/2019): Appears to have very brittle diabetes-hypoglycemic episode on 10/28.  Insulin dosage was adjusted-no further hypoglycemia episodes-continue Lantus 8 units daily and SSI.    Given the brittle nature of her diabetes-avoid strict glycemic control-allow some amount of permissive hyperglycemia.  CBG (last 3)  Recent Labs    06/15/19 1600 06/15/19 2028 06/16/19 0821  GLUCAP 113* 137* 84    Chronic diastolic heart failure: Euvolemic on exam-follow weights/intake output and volume status-no indication for diuretics at this point  Chronic atrial fibrillation with A. fib with RVR: Brief episode of RVR this morning-restart Cardizem-continue metoprolol-remains on Eliquis.    Anemia: Has anemia secondary to chronic disease-acute illness.  Patient is s/p 2 units of PRBC on 10/27.  Hemoglobin remains stable. There is no evidence of blood loss at this point.   ABG:    Component Value Date/Time   PHART 7.434 08/17/2018 2030   PCO2ART 44.0 08/17/2018 2030   PO2ART 78.1 (L) 08/17/2018 2030   HCO3 35.2 (H) 05/16/2019 1135   TCO2 29 03/03/2019 1047   ACIDBASEDEF 1.7 01/10/2019 2119   O2SAT 45.5 05/16/2019 1135    Vent Settings: N/A  Condition -Stable  Family Communication  :  Spouse updated over the phone on 10/29  Code Status :  Full Code  Diet :  Diet Order            DIET DYS 3 Room service appropriate? Yes; Fluid consistency:  Thin  Diet effective now               Disposition Plan  :  Remain hospitalized  Barriers to discharge: Brief episode of A. fib with RVR-rate control medications adjusted-if remains stable-tentative discharge to SNF on 10/30.  Consults  : Neurology, general surgery  Procedures  : 10/22>> bedside debridement of abdominal wound by general surgery  Antibiotics  :    Anti-infectives (From admission, onward)   Start     Dose/Rate Route Frequency Ordered Stop   06/10/19 2200  vancomycin (VANCOCIN) 1,500 mg in sodium chloride 0.9 % 500 mL IVPB  Status:  Discontinued     1,500 mg 250 mL/hr over 120 Minutes Intravenous Every 48 hours 06/08/19 2007 06/10/19 1057   06/10/19 1000  meropenem (MERREM) 1 g in sodium chloride 0.9 % 100 mL IVPB     1 g 200 mL/hr over 30 Minutes Intravenous Every 8 hours 06/10/19 0848 06/15/19 1548   06/09/19 1000  meropenem (MERREM) 2 g in sodium chloride 0.9 % 100 mL IVPB  Status:  Discontinued     2 g 200 mL/hr over 30 Minutes Intravenous Every 12 hours 06/09/19 0855 06/10/19 0848   06/08/19 2100  ertapenem (INVANZ) 1,000 mg in sodium chloride 0.9 % 100 mL IVPB  Status:  Discontinued     1 g 200 mL/hr over 30 Minutes Intravenous Every 24 hours 06/08/19 2007 06/09/19 0855   06/08/19 1945  vancomycin (VANCOCIN) IVPB 1000 mg/200 mL premix  Status:  Discontinued     1,000 mg 200 mL/hr over 60 Minutes Intravenous  Once 06/08/19 1936 06/08/19 1942   06/08/19 1945  ceFEPIme (MAXIPIME) 2 g in sodium chloride 0.9 % 100 mL IVPB  Status:  Discontinued     2 g 200 mL/hr over 30 Minutes Intravenous  Once 06/08/19 1936 06/08/19 2135   06/08/19 1945  vancomycin (VANCOCIN) 1,500 mg in sodium chloride 0.9 % 500 mL IVPB     1,500 mg 250 mL/hr over 120 Minutes Intravenous  Once 06/08/19 1942 06/09/19 0017      Inpatient Medications  Scheduled Meds:  apixaban  5 mg Oral BID   Chlorhexidine Gluconate Cloth  6 each Topical Daily   diltiazem  30 mg Oral Q8H   feeding  supplement  1 Container Oral TID BM   feeding supplement (ENSURE ENLIVE)  237 mL Oral TID BM   feeding supplement (PRO-STAT SUGAR FREE 64)  30 mL Oral TID   folic acid  1 mg Oral Daily   furosemide  20 mg Intravenous Once   insulin aspart  0-9 Units Subcutaneous TID WC   insulin glargine  8 Units Subcutaneous Daily   metoprolol tartrate  50 mg Oral BID   multivitamin with minerals  1 tablet Oral Daily   pantoprazole  40 mg Oral BID   umeclidinium bromide  1 puff Inhalation Daily   vitamin C  1,000 mg Oral Daily   zinc sulfate  220 mg Oral Daily   Continuous Infusions:  PRN Meds:.acetaminophen, albuterol, fluticasone, lidocaine, metoprolol tartrate, morphine injection, ondansetron **OR** ondansetron (ZOFRAN) IV, polyethylene glycol   Time Spent in minutes  25  See all Orders from today for further details  Oren Binet M.D on 06/16/2019 at 10:48 AM  To page go to [email or web address] - use universal password  [facility] -  Office  [phone]    Objective:   Vitals:   06/15/19 1548 06/15/19 2000 06/16/19 0421 06/16/19 0821  BP: 119/67 128/68 (!) 95/58 (!) 141/79  Pulse: 93  91 (!) 121  Resp: 19 18 16 18   Temp: (!) 97.2 F (36.2 C) 98.8 F (37.1 C) 98.4 F (36.9 C) 99.7 F (37.6 C)  TempSrc: Oral Oral Oral Axillary  SpO2: 100% 100% 100% 100%  Weight:  Height:        Wt Readings from Last 3 Encounters:  06/12/19 82.9 kg  06/11/19 77.1 kg  06/08/19 78.2 kg     Intake/Output Summary (Last 24 hours) at 06/16/2019 1048 Last data filed at 06/15/2019 2000 Gross per 24 hour  Intake 480 ml  Output --  Net 480 ml     Physical Exam Gen Exam:Alert awake-not in any distress HEENT:atraumatic, normocephalic Chest: B/L clear to auscultation anteriorly CVS:S1S2 regular Abdomen:soft non tender, non distended Extremities:no edema Neurology: Non focal-but with generalized weakness-able to move all 4 extremities. Skin: no rash  Data Review:     CBC  Recent Labs  Lab 06/11/19 0834 06/12/19 0549 06/13/19 0755 06/14/19 0500 06/14/19 2300 06/15/19 0415 06/16/19 0501  WBC 15.3* 10.3 8.7 9.6 10.8* 10.1 6.9  HGB 7.5* 7.4* 7.2* 6.8* 9.5* 9.2* 9.5*  HCT 24.5* 24.1* 24.6* 22.9* 30.4* 29.5* 30.2*  PLT 294 275 264 246 224 227 229  MCV 86.3 85.2 87.2 88.8 89.1 88.9 88.6  MCH 26.4 26.1 25.5* 26.4 27.9 27.7 27.9  MCHC 30.6 30.7 29.3* 29.7* 31.3 31.2 31.5  RDW 18.7* 18.9* 18.7* 18.6* 17.5* 17.5* 18.2*  LYMPHSABS 1.1 1.2 1.6 1.4 1.4  --   --   MONOABS 0.6 0.6 0.7 0.7 0.6  --   --   EOSABS 0.0 0.1 0.2 0.3 0.4  --   --   BASOSABS 0.0 0.0 0.0 0.0 0.0  --   --     Chemistries  Recent Labs  Lab 06/11/19 0834 06/12/19 0549 06/13/19 0755 06/14/19 0500 06/15/19 0415 06/16/19 0501  NA 140 140 143 139 138 136  K 3.3* 4.1 4.0 3.9 3.6 3.5  CL 113* 113* 115* 112* 110 108  CO2 19* 21* 21* 21* 22 22  GLUCOSE 289* 225* 174* 312* 71 121*  BUN 30* 24* 24* 28* 26* 22  CREATININE 0.83 0.79 0.90 0.80 0.65 0.56  CALCIUM 7.5* 7.6* 7.4* 7.3* 7.2* 7.0*  MG 2.0 1.8 1.8 1.5*  --   --   AST 22 31 49* 21 17 16   ALT 20 23 28 27 26 26   ALKPHOS 123 128* 115 112 98 105  BILITOT 0.4 0.4 0.4 0.6 0.4 0.3   ------------------------------------------------------------------------------------------------------------------ No results for input(s): CHOL, HDL, LDLCALC, TRIG, CHOLHDL, LDLDIRECT in the last 72 hours.  Lab Results  Component Value Date   HGBA1C 7.4 (H) 06/11/2019   ------------------------------------------------------------------------------------------------------------------ No results for input(s): TSH, T4TOTAL, T3FREE, THYROIDAB in the last 72 hours.  Invalid input(s): FREET3 ------------------------------------------------------------------------------------------------------------------ Recent Labs    06/15/19 0415 06/16/19 0451  FERRITIN 311* 326*    Coagulation profile No results for input(s): INR, PROTIME in the last  168 hours.  No results for input(s): DDIMER in the last 72 hours.  Cardiac Enzymes No results for input(s): CKMB, TROPONINI, MYOGLOBIN in the last 168 hours.  Invalid input(s): CK ------------------------------------------------------------------------------------------------------------------    Component Value Date/Time   BNP 461.0 (H) 05/09/2019 1750    Micro Results Recent Results (from the past 240 hour(s))  Culture, Urine     Status: Abnormal   Collection Time: 06/07/19  1:44 PM   Specimen: Urine, Clean Catch  Result Value Ref Range Status   Specimen Description   Final    URINE, CLEAN CATCH Performed at [facility], [address], [place], [place] [address]    Special Requests   Final    NONE Performed at [facility], [address], [place], [place] [address]    Culture (  A)  Final    >=100,000 COLONIES/mL KLEBSIELLA PNEUMONIAE Confirmed Extended Spectrum Beta-Lactamase Producer (ESBL).  In bloodstream infections from ESBL organisms, carbapenems are preferred over piperacillin/tazobactam. They are shown to have a lower risk of mortality.    Report Status 06/11/2019 FINAL  Final   Organism ID, Bacteria KLEBSIELLA PNEUMONIAE (A)  Final      Susceptibility   Klebsiella pneumoniae - MIC*    AMPICILLIN >=32 RESISTANT Resistant     CEFAZOLIN >=64 RESISTANT Resistant     CEFTRIAXONE >=64 RESISTANT Resistant     CIPROFLOXACIN >=4 RESISTANT Resistant     GENTAMICIN >=16 RESISTANT Resistant     IMIPENEM <=0.25 SENSITIVE Sensitive     NITROFURANTOIN 256 RESISTANT Resistant     TRIMETH/SULFA 40 SENSITIVE Sensitive     AMPICILLIN/SULBACTAM >=32 RESISTANT Resistant     PIP/TAZO >=128 RESISTANT Resistant     Extended ESBL POSITIVE Resistant     * >=100,000 COLONIES/mL KLEBSIELLA PNEUMONIAE  SARS Coronavirus 2 by RT PCR (hospital order, performed in [facility] hospital lab)     Status: Abnormal   Collection Time: 06/08/19  2:10 PM  Result Value Ref Range Status   SARS  Coronavirus 2 POSITIVE (A) NEGATIVE Final    Comment: RESULT CALLED TO, READ BACK BY AND VERIFIED WITH: JONES,V @1532  BY MATTHEWS, B 10.21.20 (NOTE) If result is NEGATIVE SARS-CoV-2 target nucleic acids are NOT DETECTED. The SARS-CoV-2 RNA is generally detectable in upper and lower  respiratory specimens during the acute phase of infection. The lowest  concentration of SARS-CoV-2 viral copies this assay can detect is 250  copies / mL. A negative result does not preclude SARS-CoV-2 infection  and should not be used as the sole basis for treatment or other  patient management decisions.  A negative result may occur with  improper specimen collection / handling, submission of specimen other  than nasopharyngeal swab, presence of viral mutation(s) within the  areas targeted by this assay, and inadequate number of viral copies  (<250 copies / mL). A negative result must be combined with clinical  observations, patient history, and epidemiological information. If result is POSITIVE SARS-CoV-2 target nucleic acids are DETECTED.  The SARS-CoV-2 RNA is generally detectable in upper and lower  respiratory specimens during the acute phase of infection.  Positive  results are indicative of active infection with SARS-CoV-2.  Clinical  correlation with patient history and other diagnostic information is  necessary to determine patient infection status.  Positive results do  not rule out bacterial infection or co-infection with other viruses. If result is PRESUMPTIVE POSTIVE SARS-CoV-2 nucleic acids MAY BE PRESENT.   A presumptive positive result was obtained on the submitted specimen  and confirmed on repeat testing.  While 2019 novel coronavirus  (SARS-CoV-2) nucleic acids may be present in the submitted sample  additional confirmatory testing may be necessary for epidemiological  and / or clinical management purposes  to differentiate between  SARS-CoV-2 and other Sarbecovirus currently known to  infect humans.  If clinically indicated additional testing with an alternate test  methodology [phone])  is advised. The SARS-CoV-2 RNA is generally  detectable in upper and lower respiratory specimens during the acute  phase of infection. The expected result is Negative. Fact Sheet for Patients:  StrictlyIdeas.no Fact Sheet for Healthcare Providers: BankingDealers.co.za This test is not yet approved or cleared by the [place] FDA and has been authorized for detection and/or diagnosis of SARS-CoV-2 by FDA under an Emergency Use Authorization (EUA).  This  EUA will remain in effect (meaning this test can be used) for the duration of the COVID-19 declaration under Section 564(b)(1) of the Act, 21 U.S.C. section 360bbb-3(b)(1), unless the authorization is terminated or revoked sooner. Performed at [facility], [address] [place], [place] [address]   Blood culture (routine x 2)     Status: None   Collection Time: 06/08/19  6:50 PM   Specimen: BLOOD  Result Value Ref Range Status   Specimen Description BLOOD PICC LINE  Final   Special Requests   Final    Blood Culture adequate volume BOTTLES DRAWN AEROBIC AND ANAEROBIC   Culture   Final    NO GROWTH 5 DAYS Performed at [facility], [address], [place], [place] [address]    Report Status 06/13/2019 FINAL  Final  Urine culture     Status: Abnormal   Collection Time: 06/08/19  6:50 PM   Specimen: Urine, Random  Result Value Ref Range Status   Specimen Description   Final    URINE, RANDOM Performed at [facility], [address] [place], [place] [address]    Special Requests   Final    NONE Performed at [facility], [address], [place], [place] [address]    Culture (A)  Final    70,000 COLONIES/mL KLEBSIELLA PNEUMONIAE Confirmed Extended Spectrum Beta-Lactamase Producer (ESBL).  In bloodstream infections from ESBL organisms, carbapenems are preferred over  piperacillin/tazobactam. They are shown to have a lower risk of mortality.    Report Status 06/15/2019 FINAL  Final   Organism ID, Bacteria KLEBSIELLA PNEUMONIAE (A)  Final      Susceptibility   Klebsiella pneumoniae - MIC*    AMPICILLIN >=32 RESISTANT Resistant     CEFAZOLIN >=64 RESISTANT Resistant     CEFTRIAXONE >=64 RESISTANT Resistant     CIPROFLOXACIN >=4 RESISTANT Resistant     GENTAMICIN >=16 RESISTANT Resistant     IMIPENEM <=0.25 SENSITIVE Sensitive     NITROFURANTOIN 256 RESISTANT Resistant     TRIMETH/SULFA 40 SENSITIVE Sensitive     AMPICILLIN/SULBACTAM >=32 RESISTANT Resistant     PIP/TAZO >=128 RESISTANT Resistant     Extended ESBL POSITIVE Resistant     * 70,000 COLONIES/mL KLEBSIELLA PNEUMONIAE  Blood culture (routine x 2)     Status: None   Collection Time: 06/08/19  7:02 PM   Specimen: BLOOD  Result Value Ref Range Status   Specimen Description BLOOD PICC LINE  Final   Special Requests Blood Culture adequate volume  Final   Culture   Final    NO GROWTH 5 DAYS Performed at [facility], [address] [place],  [address]    Report Status 06/13/2019 FINAL  Final    Radiology Reports Ct Abdomen Pelvis Wo Contrast  Result Date: 06/09/2019 CLINICAL DATA:  Chronic draining abdominal wound, pyoderma gangrenosum, leukocytosis 29, altered mental status, sepsis. EXAM: CT ABDOMEN AND PELVIS WITHOUT CONTRAST TECHNIQUE: Multidetector CT imaging of the abdomen and pelvis was performed following the standard protocol without IV contrast. COMPARISON:  Contrast-enhanced CT 04/22/2019, additional priors. FINDINGS: Lower chest: Clustered nodular ground-glass opacities in the right lower lobe with associated bronchial thickening. Findings are new from prior exam. Mild left lower lobe bronchial thickening. Cardiomegaly with coronary artery calcifications. Mitral annulus calcifications. Hepatobiliary: Stable biliary dilatation postcholecystectomy. No evidence of focal hepatic  abnormality on noncontrast exam. Pancreas: Parenchymal atrophy. No ductal dilatation or inflammation. Spleen: Calcified granuloma. Normal in size. Adrenals/Urinary Tract: No adrenal nodule. Mild bilateral renal parenchymal  thinning. No hydronephrosis. Mild bilateral renal cortical scarring. Small hypodense lesion in the left kidney on prior exam not as well-defined in the absence of IV contrast. Partially distended urinary bladder. Mild wall thickening inferiorly. Stomach/Bowel: Bowel evaluation limited in the absence of enteric contrast. Small hiatal hernia. Small duodenal diverticulum. No bowel obstruction or inflammatory change. Appendix not confidently visualized. Mild distal colonic diverticulosis without diverticulitis. Vascular/Lymphatic: Advanced aortic and branch atherosclerosis. No aortic aneurysm. No enlarged lymph nodes in the abdomen or pelvis. Reproductive: Uterus is surgically absent. Right adnexal ovarian cyst measures 4.2 cm, not significantly changed allowing for differences in caliper placement. Other: Debridement of the right lower quadrant subcutaneous soft tissues. Previous fluid collection in the subcutaneous fat is no longer seen. No residual or recurrent fluid collection. No significant phlegm a tori change. No tracking soft tissue gas to suggest acute infection. No free fluid or free air in the abdomen. Small fat containing umbilical hernia. Musculoskeletal: There are no acute or suspicious osseous abnormalities. Bones are under mineralized. Mild diffuse fatty atrophy of included musculature. IMPRESSION: 1. Resolved fluid collection in the subcutaneous tissues of the lower abdominal wall. Residual skin irregularity but no soft tissue thickening or inflammatory change. No recurrent or new abscess. 2. Clustered nodular ground-glass opacities in the right lower lobe with associated bronchial thickening, suspicious for aspiration. Or pneumonia also considered. 3. Mild bladder wall thickening  inferiorly, can be seen with urinary tract infection. Recommend correlation with urinalysis. 4. Colonic diverticulosis without diverticulitis. Aortic Atherosclerosis (ICD10-I70.0). Electronically Signed   By: Keith Rake M.D.   On: 06/09/2019 00:09   Ct Head Wo Contrast  Result Date: 05/17/2019 CLINICAL DATA:  Altered mental status EXAM: CT HEAD WITHOUT CONTRAST TECHNIQUE: Contiguous axial images were obtained from the base of the skull through the vertex without intravenous contrast. COMPARISON:  None. FINDINGS: Brain: Examination is significantly limited by motion artifact throughout. No evidence of acute infarction, hemorrhage, hydrocephalus, extra-axial collection or mass lesion/mass effect. Mild periventricular white matter hypodensity. Vascular: No hyperdense vessel or unexpected calcification. Skull: Normal. Negative for fracture or focal lesion. Sinuses/Orbits: No acute finding. Other: None. IMPRESSION: Examination is significantly limited by motion artifact throughout. Within this limitation, no acute intracranial pathology. Small-vessel white matter disease. Electronically Signed   By: Eddie Candle M.D.   On: 05/17/2019 11:48   Dg Chest Port 1 View  Result Date: 06/14/2019 CLINICAL DATA:  Hypoxia. EXAM: PORTABLE CHEST 1 VIEW COMPARISON:  Chest x-ray dated June 10, 2019. FINDINGS: The heart size and mediastinal contours are within normal limits. Atherosclerotic calcification of the aortic arch. Normal pulmonary vascularity. Mild bibasilar atelectasis. No focal consolidation, pleural effusion, or pneumothorax. No acute osseous abnormality. IMPRESSION: Mild bibasilar atelectasis. Electronically Signed   By: Titus Dubin M.D.   On: 06/14/2019 17:45   Dg Chest Portable 1 View  Result Date: 06/08/2019 CLINICAL DATA:  Altered mental status. COVID. EXAM: PORTABLE CHEST 1 VIEW COMPARISON:  Chest x-ray dated 05/15/2019 FINDINGS: The heart size and pulmonary vascularity are normal the lungs  are clear. No effusions. No acute bone abnormality. Aortic atherosclerosis. IMPRESSION: 1. No active cardiopulmonary disease. 2. Aortic atherosclerosis. Electronically Signed   By: Lorriane Shire M.D.   On: 06/08/2019 19:43   Dg Chest Port 1v Same Day  Result Date: 06/10/2019 CLINICAL DATA:  Shortness of breath EXAM: PORTABLE CHEST 1 VIEW COMPARISON:  06/08/2019 FINDINGS: Heart is normal size. No confluent airspace opacities or effusions. No acute bony abnormality. IMPRESSION: No acute cardiopulmonary disease. Electronically Signed  By: Rolm Baptise M.D.   On: 06/10/2019 08:40   Ct Head Code Stroke Wo Contrast  Result Date: 06/10/2019 CLINICAL DATA:  Code stroke. Subacute neuro deficits. Slurred speech EXAM: CT HEAD WITHOUT CONTRAST TECHNIQUE: Contiguous axial images were obtained from the base of the skull through the vertex without intravenous contrast. COMPARISON:  05/17/2019 FINDINGS: Brain: No evidence of acute infarction, hemorrhage, hydrocephalus, extra-axial collection or mass lesion/mass effect. 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 No hyperdense vessel. Skull: Normal. Negative for fracture or focal lesion. Sinuses/Orbits: No acute finding. Other: These results were communicated to [facility] at [place] 10/23/2020by text page via the [facility] messaging system. ASPECTS [facility] Stroke Program Early CT Score) - Ganglionic level infarction (caudate, lentiform nuclei, internal capsule, insula, M1-M3 cortex): 7 - Supraganglionic infarction (M4-M6 cortex): 3 Total score (0-10 with 10 being normal): 10 IMPRESSION: 1. No acute or interval finding. ASPECTS is 10. 2. Atherosclerotic calcification. Electronically Signed   By: Monte Fantasia M.D.   On: 06/10/2019 09:00   Vas US Carotid (at [place] Only)  Result Date: 06/13/2019 Carotid Arterial Duplex Study Indications:       CVA. Limitations        Today's exam was  limited due to lighting in the room. Comparison Study:  No prior. Performing Technologist: Oda Cogan RDMS, RVT  Examination Guidelines: A complete evaluation includes B-mode imaging, spectral Doppler, color Doppler, and power Doppler as needed of all accessible portions of each vessel. Bilateral testing is considered an integral part of a complete examination. Limited examinations for reoccurring indications may be performed as noted.  Right Carotid Findings: +----------+--------+--------+--------+------------------+--------+             PSV cm/s EDV cm/s Stenosis Plaque Description Comments  +----------+--------+--------+--------+------------------+--------+  CCA Prox   85       19                                             +----------+--------+--------+--------+------------------+--------+  CCA Distal 86       17                                             +----------+--------+--------+--------+------------------+--------+  ICA Prox   50       20       1-39%    heterogenous                 +----------+--------+--------+--------+------------------+--------+  ICA Distal 57       17                                             +----------+--------+--------+--------+------------------+--------+  ECA        73       11                                             +----------+--------+--------+--------+------------------+--------+ +----------+--------+-------+------------+-------------------+             PSV cm/s  EDV cms Describe     Arm Pressure (mmHG)  +----------+--------+-------+------------+-------------------+  Subclavian                  Not assessed                      +----------+--------+-------+------------+-------------------+ +---------+--------+--------+------------+  Vertebral PSV cm/s EDV cm/s Not assessed  +---------+--------+--------+------------+  Left Carotid Findings: +----------+--------+--------+--------+------------------+--------+             PSV cm/s EDV cm/s Stenosis Plaque  Description Comments  +----------+--------+--------+--------+------------------+--------+  CCA Prox   98                                                      +----------+--------+--------+--------+------------------+--------+  CCA Distal 65                                                      +----------+--------+--------+--------+------------------+--------+  ICA Prox   68       15       1-39%    heterogenous                 +----------+--------+--------+--------+------------------+--------+  ICA Distal 81       24                                             +----------+--------+--------+--------+------------------+--------+  ECA        71       7                                              +----------+--------+--------+--------+------------------+--------+ +----------+--------+--------+--------------+-------------------+             PSV cm/s EDV cm/s Describe       Arm Pressure (mmHG)  +----------+--------+--------+--------------+-------------------+  Subclavian                   Not identified                      +----------+--------+--------+--------------+-------------------+ +---------+--------+--------+------------+  Vertebral PSV cm/s EDV cm/s Not assessed  +---------+--------+--------+------------+  Summary: Right Carotid: Velocities in the right ICA are consistent with a 1-39% stenosis. Left Carotid: Velocities in the left ICA are consistent with a 1-39% stenosis. Vertebrals: Bilateral vertebral arteries were not visualized. *See table(s) above for measurements and observations.  Electronically signed by Antony Contras MD on 06/13/2019 at 3:16:33 PM.    Final    Mr Brain Wo Contrast (charm Study)  Result Date: 06/11/2019 CLINICAL DATA:  Focal neuro deficit. COVID-19 positive. Slurred speech with right-sided weakness. EXAM: MRI HEAD WITHOUT CONTRAST TECHNIQUE: Multiplanar, multiecho pulse sequences of the brain and surrounding structures were obtained without intravenous contrast. COMPARISON:  CT head  06/10/2019 FINDINGS: Brain: Negative for acute infarct Generalized atrophy. Chronic microvascular ischemic changes in the white matter and pons. Negative for hemorrhage or mass. No midline shift. Vascular: Normal arterial flow voids Skull and  upper cervical spine: Negative Sinuses/Orbits: Negative Other: None IMPRESSION: Negative for acute infarct Atrophy and moderate chronic microvascular ischemia. Electronically Signed   By: Franchot Gallo M.D.   On: 06/11/2019 11:52

## 2019-06-16 NOTE — Progress Notes (Signed)
Upon entering room patient's HR was in the 170's, EKG was done, PRN IV Lopressor was given, MD notified.

## 2019-06-16 NOTE — Progress Notes (Addendum)
Patient's CBG 68, 4 oz of juice given, recheck 64, gave 4 oz juice peanut butter and crackers, recheck 52. Gave 1/2 amp of D50, recheck 93.

## 2019-06-17 ENCOUNTER — Inpatient Hospital Stay
Admission: RE | Admit: 2019-06-17 | Discharge: 2019-07-08 | Disposition: A | Discharge status: Other Acute Inpatient Hospital | Payer: BC Managed Care – PPO | Source: Ambulatory Visit | Attending: Internal Medicine | Admitting: Internal Medicine

## 2019-06-17 LAB — GLUCOSE, CAPILLARY
Glucose-Capillary: 127 mg/dL — ABNORMAL HIGH (ref 70–99)
Glucose-Capillary: 76 mg/dL (ref 70–99)
Glucose-Capillary: 133 mg/dL — ABNORMAL HIGH (ref 70–99)

## 2019-06-17 LAB — SARS CORONAVIRUS 2 (TAT 6-24 HRS): SARS Coronavirus 2: POSITIVE — AB

## 2019-06-17 MED ORDER — INSULIN GLARGINE 100 UNIT/ML ~~LOC~~ SOLN
6.0000 [IU] | Freq: Every day | SUBCUTANEOUS | Status: DC
  Administered 2019-06-17: 10:00:00 6 [IU] via SUBCUTANEOUS
  Filled 2019-06-17: qty 0.06

## 2019-06-17 MED ORDER — FOLIC ACID 1 MG PO TABS: 1.0000 mg | ORAL_TABLET | Freq: Every day | ORAL | Status: DC

## 2019-06-17 MED ORDER — LANTUS SOLOSTAR 100 UNIT/ML ~~LOC~~ SOPN: 6.0000 [IU] | PEN_INJECTOR | Freq: Every day | SUBCUTANEOUS | 11 refills | Status: DC

## 2019-06-17 MED ORDER — POLYETHYLENE GLYCOL 3350 17 G PO PACK: 17.0000 g | PACK | Freq: Every day | ORAL | 0 refills | Status: AC | PRN

## 2019-06-17 MED ORDER — ADULT MULTIVITAMIN W/MINERALS CH: 1.0000 | ORAL_TABLET | Freq: Every day | ORAL | Status: AC

## 2019-06-17 MED ORDER — ACETAMINOPHEN 325 MG PO TABS: 650.0000 mg | ORAL_TABLET | Freq: Four times a day (QID) | ORAL | Status: DC | PRN

## 2019-06-17 MED ORDER — ENSURE ENLIVE PO LIQD: 237.0000 mL | Freq: Three times a day (TID) | ORAL | 12 refills | Status: AC

## 2019-06-17 MED ORDER — INSULIN ASPART 100 UNIT/ML ~~LOC~~ SOLN: SUBCUTANEOUS | 11 refills | Status: DC

## 2019-06-17 NOTE — TOC Transition Note (Signed)
Transition of Care [facility]) - CM/SW Discharge Note   Patient Details  Name: [name] MRN: [id number] Date of Birth: [birth year]  Transition of [facility]) CM/SW Contact:  Shade Flood, LCSW Phone Number: 06/17/2019, 11:16 AM   Clinical Narrative:     Pt stable for dc. Updated Kerri at [facility] Report can be called to [phone]. Updated RN. PTAR arranged for 1300 at RN request. EMS transport form printed to [facility] RN station. DC clinical sent electronically. There are no other TOC needs for dc.   Final next level of care: Skilled Nursing Facility Barriers to Discharge: Barriers Resolved   Patient Goals and CMS Choice   CMS Medicare.gov Compare Post Acute Care list provided to:: Patient Represenative (must comment) Choice offered to / list presented to : [place] / [facility]  Discharge Placement                       Discharge Plan and Services In-house Referral: Clinical Social Work              DME Arranged: N/A         HH Arranged: NA          Social Determinants of Health ([place]) Interventions     Readmission Risk Interventions Readmission Risk Prevention Plan 06/17/2019 05/10/2019 04/30/2019  Transportation Screening Complete Complete Complete  Medication Review [employer]) Complete Complete Complete  PCP or Specialist appointment within 3-5 days of discharge Not Complete Complete Complete  PCP/Specialist Appt Not Complete comments SNF MD will follow Going tomorrow after discharge. -  HRI or Home Care Consult Not Complete Complete Complete  HRI or Home Care Consult Pt Refusal Comments Going back to SNF - -  SW Recovery Care/Counseling Consult Complete Complete Patient refused  Palliative Care Screening Not Applicable - Not Applicable  Skilled Nursing Facility Complete Not Complete Not Applicable  Some recent data might be hidden

## 2019-06-17 NOTE — Discharge Summary (Signed)
PATIENT DETAILS Name: [name] Age: 68 y.o. Sex: female Date of Birth: 1951-02-18 MRN: [id number]. Admitting Physician: Bethena Roys, MD ASN:[id number], Ronie Spies, MD  Admit Date: 06/08/2019 Discharge date: 06/17/2019  Recommendations for Outpatient Follow-up:  1. Follow up with PCP in 1-2 weeks 2. Please obtain CMP/CBC in one week 3. Ensure follow-up with Dr. Reggie Pile surgery for chronic abdominal wound  Admitted From:  Home  Disposition: SNF   Home Health: No  Equipment/Devices: None  Discharge Condition: Stable  CODE STATUS: FULL CODE  Diet recommendation:  Diet Order            Diet general        DIET DYS 3 Room service appropriate? Yes; Fluid consistency: Thin  Diet effective now               Brief Summary: See H&P, Labs, Consult and Test reports for all details in brief, Patient is a 68 y.o. 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 Constance Haw in [place]), A. fib on anticoagulation, CKD stage III, chronic diastolic heart failure-who presented to the hospital with acute metabolic encephalopathy, hypoglycemia and complicated UTI.  Patient was started on broad-spectrum antimicrobial therapy and subsequently admitted to the hospitalist service. Due to concern for abdominal abscesses-a CT of the abdomen was done-this did not show any acute abnormalities.  Patient was subsequently transferred to [facility] on 10/22-as she was found to be COVID-19 positive.  On 10/23, she had a brief episode of slurred speech and a questionable right facial droop, she was transferred back to [facility] for neurology evaluation-a MRI of the brain did not show any acute infarct.  She was then transferred back to [facility] on 10/27 after neurology signed off.  She remained in the hospital and was on IV meropenem-as her urine culture  was positive for ESBL Klebsiella pneumonia.  Patient also had a hospitalization from 9/21-10/7 at [facility] for hypoglycemia, encephalopathy-during that admission, EEG was negative-neurology evaluation was also completed.   Brief Hospital Course: Transient slurred speech/right facial droop on 10/23 likely secondary to TIA or ongoing lingering metabolic encephalopathy: She underwent extensive work-up including neurology evaluation-MRI brain did not show any acute infarct.  TTE with preserved EF.  Carotid ultrasound without any significant stenosis.  Continue Eliquis.  Neurology has signed off.  It is now felt that the transient slurred speech/right sided facial droop was probably from metabolic encephalopathy.    Acute metabolic encephalopathy: She is relatively awake and alert this morning-suspect encephalopathy has improved.  Etiology thought to be multifactorial from hypoglycemia, UTI.  Covid 19 infection: Repeat chest x-ray on 10/27 continues to show no pneumonia-she remains on room air-her CRP is elevated but is likely secondary to chronic abdominal wound.  Do not think she requires steroids or remdesivir at this point.    COVID-19 Labs:  Recent Labs    06/15/19 0415 06/16/19 0451  FERRITIN 311* 326*  CRP 9.4* 9.4*    Lab Results  Component Value Date   SARSCOV2NAA POSITIVE (A) 06/16/2019   SARSCOV2NAA POSITIVE (A) 06/08/2019   [place] NEGATIVE 05/09/2019   [place] NEGATIVE 04/27/2019     COVID-19 Medications: Steroids:none Remdesivir:none Actemra:none Convalescent Plasma:None Research Studies:N/A  Complicated UTI: Urine culture positive for ESBL K pneumoniae-completed meropenem x7 days on 10/29.  She is afebrile-leukocytosis has resolved.  Chronic abdominal wound-history of pyoderma gangrenosum: CT of the abdomen  and pelvis without any evidence of abscess or drainable collection-she has a history of prior wound infection-and required IV Invanz for  3 weeks approximately 1 month back or so.  Appreciate general surgery input-did undergo simple bedside debridement-treated with empiric antimicrobial therapy with meropenem-and local wound care.  Follow with Dr. Constance Haw as scheduled upon discharge.  AKI on CKD stage III: AKI likely hemodynamically mediated-improving with supportive care.  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  Patient not only appears to have brittle diabetes-but her oral intake is erratic.  Avoid aggressive glycemic control-allow some amount of permissive hyperglycemia.    Chronic diastolic heart failure: Euvolemic on exam-follow weights/intake output and volume status-no indication for diuretics at this point  Chronic atrial fibrillation with A. fib with RVR: Brief episode of RVR this morning-restart Cardizem-continue metoprolol-remains on Eliquis.    Anemia: Has anemia secondary to chronic disease-acute illness.  Patient is s/p 2 units of PRBC on 10/27.  Hemoglobin remains stable. There is no evidence of blood loss at this point.  Nutrition Problem: Nutrition Problem: Increased nutrient needs Etiology: wound healing, acute illness(COVID-19) Signs/Symptoms: estimated needs Interventions: Ensure Enlive (each supplement provides 350kcal and 20 grams of protein), MVI, Juven  Obesity: Estimated body mass index is 31.37 kg/m as calculated from the following:   Height as of this encounter: 5' 4"  (1.626 m).   Weight as of this encounter: 82.9 kg.    Procedures/Studies:  10/22>> bedside debridement of abdominal wound by general surgery  Discharge Diagnoses:  Principal Problem:   Acute encephalopathy   Discharge Instructions:    Person Under Monitoring Name: [name]  Location: [address] [place]  [address]   Infection Prevention Recommendations for Individuals Confirmed to have, or Being Evaluated for, 2019 Novel Coronavirus (COVID-19) Infection Who Receive Care at Home  Individuals who are confirmed to have, or are being evaluated for, COVID-19 should follow the prevention steps below until a healthcare provider or local or state health department says they can return to normal activities.  Stay home except to get medical care You should restrict activities outside your home, except for getting medical care. Do not go to work, school, or public areas, and do not use public transportation or taxis.  Call ahead before visiting your doctor Before your medical appointment, call the healthcare provider and tell them that you have, or are being evaluated for, COVID-19 infection. This will help the healthcare providers office take steps to keep other people from getting infected. Ask your healthcare provider to call the local or state health department.  Monitor your symptoms Seek prompt medical attention if your illness is worsening (e.g., difficulty breathing). Before going to your medical appointment, call the healthcare provider and tell them that you have, or are being evaluated for, COVID-19 infection. Ask your healthcare provider to call the local or state health department.  Wear a facemask You should wear a facemask that covers your nose and mouth when you are in the same room with other people and when you visit a healthcare provider. People who live with or visit you should also wear a facemask while they are in the same room with you.  Separate yourself from other people in your home As much as possible, you should stay in a different room from other people in your home. Also,  you should use a separate bathroom, if available.  Avoid sharing household items You should not share dishes, drinking glasses, cups, eating utensils, towels, bedding, or other items with other  people in your home. After using these items, you should wash them thoroughly with soap and water.  Cover your coughs and sneezes Cover your mouth and nose with a tissue when you cough or sneeze, or you can cough or sneeze into your sleeve. Throw used tissues in a lined trash can, and immediately wash your hands with soap and water for at least 20 seconds or use an alcohol-based hand rub.  Wash your [facility] your hands often and thoroughly with soap and water for at least 20 seconds. You can use an alcohol-based hand sanitizer if soap and water are not available and if your hands are not visibly dirty. Avoid touching your eyes, nose, and mouth with unwashed hands.   Prevention Steps for Caregivers and Household Members of Individuals Confirmed to have, or Being Evaluated for, COVID-19 Infection Being Cared for in the Home  If you live with, or provide care at home for, a person confirmed to have, or being evaluated for, COVID-19 infection please follow these guidelines to prevent infection:  Follow healthcare providers instructions Make sure that you understand and can help the patient follow any healthcare provider instructions for all care.  Provide for the patients basic needs You should help the patient with basic needs in the home and provide support for getting groceries, prescriptions, and other personal needs.  Monitor the patients symptoms If they are getting sicker, call his or her medical provider and tell them that the patient has, or is being evaluated for, COVID-19 infection. This will help the healthcare providers office take steps to keep other people from getting infected. Ask the healthcare provider to call the local or state health department.  Limit the number of people who have contact with the patient  If possible, have only one caregiver for the patient.  Other household members should stay in another home or place of residence. If this is not  possible, they should stay  in another room, or be separated from the patient as much as possible. Use a separate bathroom, if available.  Restrict visitors who do not have an essential need to be in the home.  Keep older adults, very young children, and other sick people away from the patient Keep older adults, very young children, and those who have compromised immune systems or chronic health conditions away from the patient. This includes people with chronic heart, lung, or kidney conditions, diabetes, and cancer.  Ensure good ventilation Make sure that shared spaces in the home have good air flow, such as from an air conditioner or an opened window, weather permitting.  Wash your hands often  Wash your hands often and thoroughly with soap and water for at least 20 seconds. You can use an alcohol based hand sanitizer if soap and water are not available and if your hands are not visibly dirty.  Avoid touching your eyes, nose, and mouth with unwashed hands.  Use disposable paper towels to dry your hands. If not available, use dedicated cloth towels and replace them when they become wet.  Wear a facemask and gloves  Wear a disposable facemask at all times in the room and gloves when you touch or have contact with the patients blood, body fluids, and/or secretions or excretions, such as sweat, saliva, sputum, nasal mucus, vomit, urine, or feces.  Ensure the mask fits over your nose and mouth tightly, and do not touch it during use.  Throw out disposable facemasks and gloves after using them. Do not reuse.  Wash your hands immediately after removing your facemask and gloves.  If your personal clothing becomes contaminated, carefully remove clothing and launder. Wash your hands after handling contaminated clothing.  Place all used disposable facemasks, gloves, and other waste in a lined container before disposing them with other household waste.  Remove gloves and wash your hands  immediately after handling these items.  Do not share dishes, glasses, or other household items with the patient  Avoid sharing household items. You should not share dishes, drinking glasses, cups, eating utensils, towels, bedding, or other items with a patient who is confirmed to have, or being evaluated for, COVID-19 infection.  After the person uses these items, you should wash them thoroughly with soap and water.  Wash laundry thoroughly  Immediately remove and wash clothes or bedding that have blood, body fluids, and/or secretions or excretions, such as sweat, saliva, sputum, nasal mucus, vomit, urine, or feces, on them.  Wear gloves when handling laundry from the patient.  Read and follow directions on labels of laundry or clothing items and detergent. In general, wash and dry with the warmest temperatures recommended on the label.  Clean all areas the individual has used often  Clean all touchable surfaces, such as counters, tabletops, doorknobs, bathroom fixtures, toilets, phones, keyboards, tablets, and bedside tables, every day. Also, clean any surfaces that may have blood, body fluids, and/or secretions or excretions on them.  Wear gloves when cleaning surfaces the patient has come in contact with.  Use a diluted bleach solution (e.g., dilute bleach with 1 part bleach and 10 parts water) or a household disinfectant with a label that says EPA-registered for coronaviruses. To make a bleach solution at home, add 1 tablespoon of bleach to 1 quart (4 cups) of water. For a larger supply, add  cup of bleach to 1 gallon (16 cups) of water.  Read labels of cleaning products and follow recommendations provided on product labels. Labels contain instructions for safe and effective use of the cleaning product including precautions you should take when applying the product, such as wearing gloves or eye protection and making sure you have good ventilation during use of the product.  Remove  gloves and wash hands immediately after cleaning.  Monitor yourself for signs and symptoms of illness Caregivers and household members are considered close contacts, should monitor their health, and will be asked to limit movement outside of the home to the extent possible. Follow the monitoring steps for close contacts listed on the symptom monitoring form.   ? If you have additional questions, contact your local health department or call the epidemiologist on call at [phone] (available 24/7). ? This guidance is subject to change. For the most up-to-date guidance from CDC, please refer to their website: YouBlogs.pl   Activity:  As tolerated with Full fall precautions use walker/cane & assistance as needed  Discharge Instructions    Call MD for:  redness, tenderness, or signs of infection (pain, swelling, redness, odor or green/yellow discharge around incision site)   Complete by: As directed    Diet general   Complete by: As directed    Discharge wound care:   Complete by: As directed    Saline moist fluff gauze to the abdominal wound, top with ABD pad. Change BID   Increase activity slowly  Complete by: As directed       Allergies  Allergen Reactions   Feraheme [Ferumoxytol] Other (See Comments)    Back pain (yelling out with back pain)   Propranolol Swelling    Pt states it may have been leg swelling   Topamax [Topiramate] Other (See Comments)    hallucinations   Latex Itching and Rash    Consultations:   neurology and general surgery   Other Procedures/Studies: Ct Abdomen Pelvis Wo Contrast  Result Date: 06/09/2019 CLINICAL DATA:  Chronic draining abdominal wound, pyoderma gangrenosum, leukocytosis 29, altered mental status, sepsis. EXAM: CT ABDOMEN AND PELVIS WITHOUT CONTRAST TECHNIQUE: Multidetector CT imaging of the abdomen and pelvis was performed following the standard protocol without  IV contrast. COMPARISON:  Contrast-enhanced CT 04/22/2019, additional priors. FINDINGS: Lower chest: Clustered nodular ground-glass opacities in the right lower lobe with associated bronchial thickening. Findings are new from prior exam. Mild left lower lobe bronchial thickening. Cardiomegaly with coronary artery calcifications. Mitral annulus calcifications. Hepatobiliary: Stable biliary dilatation postcholecystectomy. No evidence of focal hepatic abnormality on noncontrast exam. Pancreas: Parenchymal atrophy. No ductal dilatation or inflammation. Spleen: Calcified granuloma. Normal in size. Adrenals/Urinary Tract: No adrenal nodule. Mild bilateral renal parenchymal thinning. No hydronephrosis. Mild bilateral renal cortical scarring. Small hypodense lesion in the left kidney on prior exam not as well-defined in the absence of IV contrast. Partially distended urinary bladder. Mild wall thickening inferiorly. Stomach/Bowel: Bowel evaluation limited in the absence of enteric contrast. Small hiatal hernia. Small duodenal diverticulum. No bowel obstruction or inflammatory change. Appendix not confidently visualized. Mild distal colonic diverticulosis without diverticulitis. Vascular/Lymphatic: Advanced aortic and branch atherosclerosis. No aortic aneurysm. No enlarged lymph nodes in the abdomen or pelvis. Reproductive: Uterus is surgically absent. Right adnexal ovarian cyst measures 4.2 cm, not significantly changed allowing for differences in caliper placement. Other: Debridement of the right lower quadrant subcutaneous soft tissues. Previous fluid collection in the subcutaneous fat is no longer seen. No residual or recurrent fluid collection. No significant phlegm a tori change. No tracking soft tissue gas to suggest acute infection. No free fluid or free air in the abdomen. Small fat containing umbilical hernia. Musculoskeletal: There are no acute or suspicious osseous abnormalities. Bones are under mineralized. Mild  diffuse fatty atrophy of included musculature. IMPRESSION: 1. Resolved fluid collection in the subcutaneous tissues of the lower abdominal wall. Residual skin irregularity but no soft tissue thickening or inflammatory change. No recurrent or new abscess. 2. Clustered nodular ground-glass opacities in the right lower lobe with associated bronchial thickening, suspicious for aspiration. Or pneumonia also considered. 3. Mild bladder wall thickening inferiorly, can be seen with urinary tract infection. Recommend correlation with urinalysis. 4. Colonic diverticulosis without diverticulitis. Aortic Atherosclerosis (ICD10-I70.0). Electronically Signed   By: Keith Rake M.D.   On: 06/09/2019 00:09   Dg Chest Port 1 View  Result Date: 06/14/2019 CLINICAL DATA:  Hypoxia. EXAM: PORTABLE CHEST 1 VIEW COMPARISON:  Chest x-ray dated June 10, 2019. FINDINGS: The heart size and mediastinal contours are within normal limits. Atherosclerotic calcification of the aortic arch. Normal pulmonary vascularity. Mild bibasilar atelectasis. No focal consolidation, pleural effusion, or pneumothorax. No acute osseous abnormality. IMPRESSION: Mild bibasilar atelectasis. Electronically Signed   By: Titus Dubin M.D.   On: 06/14/2019 17:45   Dg Chest Portable 1 View  Result Date: 06/08/2019 CLINICAL DATA:  Altered mental status. COVID. EXAM: PORTABLE CHEST 1 VIEW COMPARISON:  Chest x-ray dated 05/15/2019 FINDINGS: The heart size and pulmonary vascularity are normal the lungs  are clear. No effusions. No acute bone abnormality. Aortic atherosclerosis. IMPRESSION: 1. No active cardiopulmonary disease. 2. Aortic atherosclerosis. Electronically Signed   By: Lorriane Shire M.D.   On: 06/08/2019 19:43   Dg Chest Port 1v Same Day  Result Date: 06/10/2019 CLINICAL DATA:  Shortness of breath EXAM: PORTABLE CHEST 1 VIEW COMPARISON:  06/08/2019 FINDINGS: Heart is normal size. No confluent airspace opacities or effusions. No acute  bony abnormality. IMPRESSION: No acute cardiopulmonary disease. Electronically Signed   By: Rolm Baptise M.D.   On: 06/10/2019 08:40   Ct Head Code Stroke Wo Contrast  Result Date: 06/10/2019 CLINICAL DATA:  Code stroke. Subacute neuro deficits. Slurred speech EXAM: CT HEAD WITHOUT CONTRAST TECHNIQUE: Contiguous axial images were obtained from the base of the skull through the vertex without intravenous contrast. COMPARISON:  05/17/2019 FINDINGS: Brain: No evidence of acute infarction, hemorrhage, hydrocephalus, extra-axial collection or mass lesion/mass effect. 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 No hyperdense vessel. Skull: Normal. Negative for fracture or focal lesion. Sinuses/Orbits: No acute finding. Other: These results were communicated to [place] [facility] [place] 10/23/2020by text page via the [facility] messaging system. ASPECTS [facility] Stroke Program Early CT Score) - Ganglionic level infarction (caudate, lentiform nuclei, internal capsule, insula, M1-M3 cortex): 7 - Supraganglionic infarction (M4-M6 cortex): 3 Total score (0-10 with 10 being normal): 10 IMPRESSION: 1. No acute or interval finding. ASPECTS is 10. 2. Atherosclerotic calcification. Electronically Signed   By: Monte Fantasia M.D.   On: 06/10/2019 09:00   Vas US Carotid (at [place] Only)  Result Date: 06/13/2019 Carotid Arterial Duplex Study Indications:       CVA. Limitations        Today's exam was limited due to lighting in the room. Comparison Study:  No prior. Performing Technologist: Oda Cogan RDMS, RVT  Examination Guidelines: A complete evaluation includes B-mode imaging, spectral Doppler, color Doppler, and power Doppler as needed of all accessible portions of each vessel. Bilateral testing is considered an integral part of a complete examination. Limited examinations for reoccurring indications may be performed as noted.  Right  Carotid Findings: +----------+--------+--------+--------+------------------+--------+             PSV cm/s EDV cm/s Stenosis Plaque Description Comments  +----------+--------+--------+--------+------------------+--------+  CCA Prox   85       19                                             +----------+--------+--------+--------+------------------+--------+  CCA Distal 86       17                                             +----------+--------+--------+--------+------------------+--------+  ICA Prox   50       20       1-39%    heterogenous                 +----------+--------+--------+--------+------------------+--------+  ICA Distal 57       17                                             +----------+--------+--------+--------+------------------+--------+  ECA        73       11                                             +----------+--------+--------+--------+------------------+--------+ +----------+--------+-------+------------+-------------------+             PSV cm/s EDV cms Describe     Arm Pressure (mmHG)  +----------+--------+-------+------------+-------------------+  Subclavian                  Not assessed                      +----------+--------+-------+------------+-------------------+ +---------+--------+--------+------------+  Vertebral PSV cm/s EDV cm/s Not assessed  +---------+--------+--------+------------+  Left Carotid Findings: +----------+--------+--------+--------+------------------+--------+             PSV cm/s EDV cm/s Stenosis Plaque Description Comments  +----------+--------+--------+--------+------------------+--------+  CCA Prox   98                                                      +----------+--------+--------+--------+------------------+--------+  CCA Distal 65                                                      +----------+--------+--------+--------+------------------+--------+  ICA Prox   68       15       1-39%    heterogenous                  +----------+--------+--------+--------+------------------+--------+  ICA Distal 81       24                                             +----------+--------+--------+--------+------------------+--------+  ECA        71       7                                              +----------+--------+--------+--------+------------------+--------+ +----------+--------+--------+--------------+-------------------+             PSV cm/s EDV cm/s Describe       Arm Pressure (mmHG)  +----------+--------+--------+--------------+-------------------+  Subclavian                   Not identified                      +----------+--------+--------+--------------+-------------------+ +---------+--------+--------+------------+  Vertebral PSV cm/s EDV cm/s Not assessed  +---------+--------+--------+------------+  Summary: Right Carotid: Velocities in the right ICA are consistent with a 1-39% stenosis. Left Carotid: Velocities in the left ICA are consistent with a 1-39% stenosis. Vertebrals: Bilateral vertebral arteries were not visualized. *See table(s) above for measurements and observations.  Electronically signed by Antony Contras MD on 06/13/2019 at 3:16:33 PM.    Final    Mr Brain Wo Contrast (charm Study)  Result Date: 06/11/2019 CLINICAL DATA:  Focal neuro deficit. COVID-19 positive. Slurred speech with right-sided weakness. EXAM: MRI HEAD WITHOUT CONTRAST TECHNIQUE: Multiplanar, multiecho pulse sequences of the brain and surrounding structures were obtained without intravenous contrast. COMPARISON:  CT head 06/10/2019 FINDINGS: Brain: Negative for acute infarct Generalized atrophy. Chronic microvascular ischemic changes in the white matter and pons. Negative for hemorrhage or mass. No midline shift. Vascular: Normal arterial flow voids Skull and upper cervical spine: Negative Sinuses/Orbits: Negative Other: None IMPRESSION: Negative for acute infarct Atrophy and moderate chronic microvascular ischemia. Electronically Signed   By:  Franchot Gallo M.D.   On: 06/11/2019 11:52     TODAY-DAY OF DISCHARGE:  Subjective:   [name] today has no headache,no chest abdominal pain,no new weakness tingling or numbness, feels much better wants to go home today.   Objective:   Blood pressure 128/76, pulse 82, temperature (!) 97.4 F (36.3 C), temperature source Oral, resp. rate 19, height 5' 4"  (1.626 m), weight 82.9 kg, SpO2 100 %.  Intake/Output Summary (Last 24 hours) at 06/17/2019 1100 Last data filed at 06/17/2019 0200 Gross per 24 hour  Intake 720 ml  Output 500 ml  Net 220 ml   Filed Weights   06/08/19 1838 06/12/19 0300  Weight: 78 kg 82.9 kg    Exam: Awake Alert, Oriented *3, No new F.N deficits, Normal affect [place].AT,PERRAL Supple Neck,No JVD, No cervical lymphadenopathy appriciated.  Symmetrical Chest wall movement, Good air movement bilaterally, CTAB RRR,No Gallops,Rubs or new Murmurs, No Parasternal Heave +ve B.Sounds, Abd Soft, Non tender, No organomegaly appriciated, No rebound -guarding or rigidity. No Cyanosis, Clubbing or edema, No new Rash or bruise   PERTINENT RADIOLOGIC STUDIES: Ct Abdomen Pelvis Wo Contrast  Result Date: 06/09/2019 CLINICAL DATA:  Chronic draining abdominal wound, pyoderma gangrenosum, leukocytosis 29, altered mental status, sepsis. EXAM: CT ABDOMEN AND PELVIS WITHOUT CONTRAST TECHNIQUE: Multidetector CT imaging of the abdomen and pelvis was performed following the standard protocol without IV contrast. COMPARISON:  Contrast-enhanced CT 04/22/2019, additional priors. FINDINGS: Lower chest: Clustered nodular ground-glass opacities in the right lower lobe with associated bronchial thickening. Findings are new from prior exam. Mild left lower lobe bronchial thickening. Cardiomegaly with coronary artery calcifications. Mitral annulus calcifications. Hepatobiliary: Stable biliary dilatation postcholecystectomy. No evidence of focal hepatic abnormality on noncontrast exam.  Pancreas: Parenchymal atrophy. No ductal dilatation or inflammation. Spleen: Calcified granuloma. Normal in size. Adrenals/Urinary Tract: No adrenal nodule. Mild bilateral renal parenchymal thinning. No hydronephrosis. Mild bilateral renal cortical scarring. Small hypodense lesion in the left kidney on prior exam not as well-defined in the absence of IV contrast. Partially distended urinary bladder. Mild wall thickening inferiorly. Stomach/Bowel: Bowel evaluation limited in the absence of enteric contrast. Small hiatal hernia. Small duodenal diverticulum. No bowel obstruction or inflammatory change. Appendix not confidently visualized. Mild distal colonic diverticulosis without diverticulitis. Vascular/Lymphatic: Advanced aortic and branch atherosclerosis. No aortic aneurysm. No enlarged lymph nodes in the abdomen or pelvis. Reproductive: Uterus is surgically absent. Right adnexal ovarian cyst measures 4.2 cm, not significantly changed allowing for differences in caliper placement. Other: Debridement of the right lower quadrant subcutaneous soft tissues. Previous fluid collection in the subcutaneous fat is no longer seen. No residual or recurrent fluid collection. No significant phlegm a tori change. No tracking soft tissue gas to suggest acute infection. No free fluid or free air in the abdomen. Small fat containing umbilical hernia. Musculoskeletal: There are no acute or suspicious osseous abnormalities. Bones are under mineralized. Mild diffuse fatty atrophy  of included musculature. IMPRESSION: 1. Resolved fluid collection in the subcutaneous tissues of the lower abdominal wall. Residual skin irregularity but no soft tissue thickening or inflammatory change. No recurrent or new abscess. 2. Clustered nodular ground-glass opacities in the right lower lobe with associated bronchial thickening, suspicious for aspiration. Or pneumonia also considered. 3. Mild bladder wall thickening inferiorly, can be seen with  urinary tract infection. Recommend correlation with urinalysis. 4. Colonic diverticulosis without diverticulitis. Aortic Atherosclerosis (ICD10-I70.0). Electronically Signed   By: Keith Rake M.D.   On: 06/09/2019 00:09   Dg Chest Port 1 View  Result Date: 06/14/2019 CLINICAL DATA:  Hypoxia. EXAM: PORTABLE CHEST 1 VIEW COMPARISON:  Chest x-ray dated June 10, 2019. FINDINGS: The heart size and mediastinal contours are within normal limits. Atherosclerotic calcification of the aortic arch. Normal pulmonary vascularity. Mild bibasilar atelectasis. No focal consolidation, pleural effusion, or pneumothorax. No acute osseous abnormality. IMPRESSION: Mild bibasilar atelectasis. Electronically Signed   By: Titus Dubin M.D.   On: 06/14/2019 17:45   Dg Chest Portable 1 View  Result Date: 06/08/2019 CLINICAL DATA:  Altered mental status. COVID. EXAM: PORTABLE CHEST 1 VIEW COMPARISON:  Chest x-ray dated 05/15/2019 FINDINGS: The heart size and pulmonary vascularity are normal the lungs are clear. No effusions. No acute bone abnormality. Aortic atherosclerosis. IMPRESSION: 1. No active cardiopulmonary disease. 2. Aortic atherosclerosis. Electronically Signed   By: Lorriane Shire M.D.   On: 06/08/2019 19:43   Dg Chest Port 1v Same Day  Result Date: 06/10/2019 CLINICAL DATA:  Shortness of breath EXAM: PORTABLE CHEST 1 VIEW COMPARISON:  06/08/2019 FINDINGS: Heart is normal size. No confluent airspace opacities or effusions. No acute bony abnormality. IMPRESSION: No acute cardiopulmonary disease. Electronically Signed   By: Rolm Baptise M.D.   On: 06/10/2019 08:40   Ct Head Code Stroke Wo Contrast  Result Date: 06/10/2019 CLINICAL DATA:  Code stroke. Subacute neuro deficits. Slurred speech EXAM: CT HEAD WITHOUT CONTRAST TECHNIQUE: Contiguous axial images were obtained from the base of the skull through the vertex without intravenous contrast. COMPARISON:  05/17/2019 FINDINGS: Brain: No evidence of  acute infarction, hemorrhage, hydrocephalus, extra-axial collection or mass lesion/mass effect. 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 No hyperdense vessel. Skull: Normal. Negative for fracture or focal lesion. Sinuses/Orbits: No acute finding. Other: These results were communicated to [place] [facility] [place] 10/23/2020by text page via the [facility] messaging system. ASPECTS [facility] Stroke Program Early CT Score) - Ganglionic level infarction (caudate, lentiform nuclei, internal capsule, insula, M1-M3 cortex): 7 - Supraganglionic infarction (M4-M6 cortex): 3 Total score (0-10 with 10 being normal): 10 IMPRESSION: 1. No acute or interval finding. ASPECTS is 10. 2. Atherosclerotic calcification. Electronically Signed   By: Monte Fantasia M.D.   On: 06/10/2019 09:00   Vas US Carotid (at [place] Only)  Result Date: 06/13/2019 Carotid Arterial Duplex Study Indications:       CVA. Limitations        Today's exam was limited due to lighting in the room. Comparison Study:  No prior. Performing Technologist: Oda Cogan RDMS, RVT  Examination Guidelines: A complete evaluation includes B-mode imaging, spectral Doppler, color Doppler, and power Doppler as needed of all accessible portions of each vessel. Bilateral testing is considered an integral part of a complete examination. Limited examinations for reoccurring indications may be performed as noted.  Right Carotid Findings: +----------+--------+--------+--------+------------------+--------+             PSV  cm/s EDV cm/s Stenosis Plaque Description Comments  +----------+--------+--------+--------+------------------+--------+  CCA Prox   85       19                                             +----------+--------+--------+--------+------------------+--------+  CCA Distal 86       17                                              +----------+--------+--------+--------+------------------+--------+  ICA Prox   50       20       1-39%    heterogenous                 +----------+--------+--------+--------+------------------+--------+  ICA Distal 57       17                                             +----------+--------+--------+--------+------------------+--------+  ECA        73       11                                             +----------+--------+--------+--------+------------------+--------+ +----------+--------+-------+------------+-------------------+             PSV cm/s EDV cms Describe     Arm Pressure (mmHG)  +----------+--------+-------+------------+-------------------+  Subclavian                  Not assessed                      +----------+--------+-------+------------+-------------------+ +---------+--------+--------+------------+  Vertebral PSV cm/s EDV cm/s Not assessed  +---------+--------+--------+------------+  Left Carotid Findings: +----------+--------+--------+--------+------------------+--------+             PSV cm/s EDV cm/s Stenosis Plaque Description Comments  +----------+--------+--------+--------+------------------+--------+  CCA Prox   98                                                      +----------+--------+--------+--------+------------------+--------+  CCA Distal 65                                                      +----------+--------+--------+--------+------------------+--------+  ICA Prox   68       15       1-39%    heterogenous                 +----------+--------+--------+--------+------------------+--------+  ICA Distal 81       24                                             +----------+--------+--------+--------+------------------+--------+  ECA        71       7                                              +----------+--------+--------+--------+------------------+--------+ +----------+--------+--------+--------------+-------------------+             PSV cm/s EDV cm/s Describe       Arm  Pressure (mmHG)  +----------+--------+--------+--------------+-------------------+  Subclavian                   Not identified                      +----------+--------+--------+--------------+-------------------+ +---------+--------+--------+------------+  Vertebral PSV cm/s EDV cm/s Not assessed  +---------+--------+--------+------------+  Summary: Right Carotid: Velocities in the right ICA are consistent with a 1-39% stenosis. Left Carotid: Velocities in the left ICA are consistent with a 1-39% stenosis. Vertebrals: Bilateral vertebral arteries were not visualized. *See table(s) above for measurements and observations.  Electronically signed by Antony Contras MD on 06/13/2019 at 3:16:33 PM.    Final    Mr Brain Wo Contrast (charm Study)  Result Date: 06/11/2019 CLINICAL DATA:  Focal neuro deficit. COVID-19 positive. Slurred speech with right-sided weakness. EXAM: MRI HEAD WITHOUT CONTRAST TECHNIQUE: Multiplanar, multiecho pulse sequences of the brain and surrounding structures were obtained without intravenous contrast. COMPARISON:  CT head 06/10/2019 FINDINGS: Brain: Negative for acute infarct Generalized atrophy. Chronic microvascular ischemic changes in the white matter and pons. Negative for hemorrhage or mass. No midline shift. Vascular: Normal arterial flow voids Skull and upper cervical spine: Negative Sinuses/Orbits: Negative Other: None IMPRESSION: Negative for acute infarct Atrophy and moderate chronic microvascular ischemia. Electronically Signed   By: Franchot Gallo M.D.   On: 06/11/2019 11:52     PERTINENT LAB RESULTS: CBC: Recent Labs    06/15/19 0415 06/16/19 0501  WBC 10.1 6.9  HGB 9.2* 9.5*  HCT 29.5* 30.2*  PLT 227 229   CMET CMP     Component Value Date/Time   NA 136 06/16/2019 0501   K 3.5 06/16/2019 0501   CL 108 06/16/2019 0501   CO2 22 06/16/2019 0501   GLUCOSE 121 (H) 06/16/2019 0501   BUN 22 06/16/2019 0501   CREATININE 0.56 06/16/2019 0501   CREATININE 0.81  01/25/2019 1354   CALCIUM 7.0 (L) 06/16/2019 0501   PROT 4.2 (L) 06/16/2019 0501   ALBUMIN 1.3 (L) 06/16/2019 0501   AST 16 06/16/2019 0501   AST 7 (L) 01/25/2019 1354   ALT 26 06/16/2019 0501   ALT <6 01/25/2019 1354   ALKPHOS 105 06/16/2019 0501   BILITOT 0.3 06/16/2019 0501   BILITOT 0.7 01/25/2019 1354   GFRNONAA >60 06/16/2019 0501   GFRNONAA >60 01/25/2019 1354   GFRAA >60 06/16/2019 0501   GFRAA >60 01/25/2019 1354    GFR Estimated Creatinine Clearance: 70.1 mL/min (by C-G formula based on SCr of 0.56 mg/dL). No results for input(s): LIPASE, AMYLASE in the last 72 hours. No results for input(s): CKTOTAL, CKMB, CKMBINDEX, TROPONINI in the last 72 hours. Invalid input(s): POCBNP No results for input(s): DDIMER in the last 72 hours. No results for input(s): HGBA1C in the last 72 hours. No results for input(s): CHOL, HDL, LDLCALC, TRIG, CHOLHDL, LDLDIRECT in the last 72 hours. No results for input(s): TSH, T4TOTAL, T3FREE, THYROIDAB in the last 72 hours.  Invalid  input(s): FREET3 Recent Labs    06/15/19 0415 06/16/19 0451  FERRITIN 311* 326*   Coags: No results for input(s): INR in the last 72 hours.  Invalid input(s): PT Microbiology: Recent Results (from the past 240 hour(s))  Culture, Urine     Status: Abnormal   Collection Time: 06/07/19  1:44 PM   Specimen: Urine, Clean Catch  Result Value Ref Range Status   Specimen Description   Final    URINE, CLEAN CATCH Performed at [facility], [address], [place], [place] [address]    Special Requests   Final    NONE Performed at [facility], [address], [place], [place] [address]    Culture (A)  Final    >=100,000 COLONIES/mL KLEBSIELLA PNEUMONIAE Confirmed Extended Spectrum Beta-Lactamase Producer (ESBL).  In bloodstream infections from ESBL organisms, carbapenems are preferred over piperacillin/tazobactam. They are shown to have a lower risk of mortality.    Report Status 06/11/2019 FINAL  Final    Organism ID, Bacteria KLEBSIELLA PNEUMONIAE (A)  Final      Susceptibility   Klebsiella pneumoniae - MIC*    AMPICILLIN >=32 RESISTANT Resistant     CEFAZOLIN >=64 RESISTANT Resistant     CEFTRIAXONE >=64 RESISTANT Resistant     CIPROFLOXACIN >=4 RESISTANT Resistant     GENTAMICIN >=16 RESISTANT Resistant     IMIPENEM <=0.25 SENSITIVE Sensitive     NITROFURANTOIN 256 RESISTANT Resistant     TRIMETH/SULFA 40 SENSITIVE Sensitive     AMPICILLIN/SULBACTAM >=32 RESISTANT Resistant     PIP/TAZO >=128 RESISTANT Resistant     Extended ESBL POSITIVE Resistant     * >=100,000 COLONIES/mL KLEBSIELLA PNEUMONIAE  SARS Coronavirus 2 by RT PCR (hospital order, performed in [place] hospital lab)     Status: Abnormal   Collection Time: 06/08/19  2:10 PM  Result Value Ref Range Status   SARS Coronavirus 2 POSITIVE (A) NEGATIVE Final    Comment: RESULT CALLED TO, READ BACK BY AND VERIFIED WITH: JONES,V @1532  BY MATTHEWS, B 10.21.20 (NOTE) If result is NEGATIVE SARS-CoV-2 target nucleic acids are NOT DETECTED. The SARS-CoV-2 RNA is generally detectable in upper and lower  respiratory specimens during the acute phase of infection. The lowest  concentration of SARS-CoV-2 viral copies this assay can detect is 250  copies / mL. A negative result does not preclude SARS-CoV-2 infection  and should not be used as the sole basis for treatment or other  patient management decisions.  A negative result may occur with  improper specimen collection / handling, submission of specimen other  than nasopharyngeal swab, presence of viral mutation(s) within the  areas targeted by this assay, and inadequate number of viral copies  (<250 copies / mL). A negative result must be combined with clinical  observations, patient history, and epidemiological information. If result is POSITIVE SARS-CoV-2 target nucleic acids are DETECTED.  The SARS-CoV-2 RNA is generally detectable in upper and lower  respiratory  specimens during the acute phase of infection.  Positive  results are indicative of active infection with SARS-CoV-2.  Clinical  correlation with patient history and other diagnostic information is  necessary to determine patient infection status.  Positive results do  not rule out bacterial infection or co-infection with other viruses. If result is PRESUMPTIVE POSTIVE SARS-CoV-2 nucleic acids MAY BE PRESENT.   A presumptive positive result was obtained on the submitted specimen  and confirmed on repeat testing.  While 2019 novel coronavirus  (SARS-CoV-2) nucleic acids may be present  in the submitted sample  additional confirmatory testing may be necessary for epidemiological  and / or clinical management purposes  to differentiate between  SARS-CoV-2 and other Sarbecovirus currently known to infect humans.  If clinically indicated additional testing with an alternate test  methodology [phone])  is advised. The SARS-CoV-2 RNA is generally  detectable in upper and lower respiratory specimens during the acute  phase of infection. The expected result is Negative. Fact Sheet for Patients:  StrictlyIdeas.no Fact Sheet for Healthcare Providers: BankingDealers.co.za This test is not yet approved or cleared by the [place] FDA and has been authorized for detection and/or diagnosis of SARS-CoV-2 by FDA under an Emergency Use Authorization (EUA).  This EUA will remain in effect (meaning this test can be used) for the duration of the COVID-19 declaration under Section 564(b)(1) of the Act, 21 U.S.C. section 360bbb-3(b)(1), unless the authorization is terminated or revoked sooner. Performed at [facility], [address], [place], [place] [address]   Blood culture (routine x 2)     Status: None   Collection Time: 06/08/19  6:50 PM   Specimen: BLOOD  Result Value Ref Range Status   Specimen Description BLOOD PICC LINE  Final   Special  Requests   Final    Blood Culture adequate volume BOTTLES DRAWN AEROBIC AND ANAEROBIC   Culture   Final    NO GROWTH 5 DAYS Performed at [facility], [address] [place], [place] [address]    Report Status 06/13/2019 FINAL  Final  Urine culture     Status: Abnormal   Collection Time: 06/08/19  6:50 PM   Specimen: Urine, Random  Result Value Ref Range Status   Specimen Description   Final    URINE, RANDOM Performed at [facility], [address], [place], [place] [address]    Special Requests   Final    NONE Performed at [facility], [address] [place], [place] [address]    Culture (A)  Final    70,000 COLONIES/mL KLEBSIELLA PNEUMONIAE Confirmed Extended Spectrum Beta-Lactamase Producer (ESBL).  In bloodstream infections from ESBL organisms, carbapenems are preferred over piperacillin/tazobactam. They are shown to have a lower risk of mortality.    Report Status 06/15/2019 FINAL  Final   Organism ID, Bacteria KLEBSIELLA PNEUMONIAE (A)  Final      Susceptibility   Klebsiella pneumoniae - MIC*    AMPICILLIN >=32 RESISTANT Resistant     CEFAZOLIN >=64 RESISTANT Resistant     CEFTRIAXONE >=64 RESISTANT Resistant     CIPROFLOXACIN >=4 RESISTANT Resistant     GENTAMICIN >=16 RESISTANT Resistant     IMIPENEM <=0.25 SENSITIVE Sensitive     NITROFURANTOIN 256 RESISTANT Resistant     TRIMETH/SULFA 40 SENSITIVE Sensitive     AMPICILLIN/SULBACTAM >=32 RESISTANT Resistant     PIP/TAZO >=128 RESISTANT Resistant     Extended ESBL POSITIVE Resistant     * 70,000 COLONIES/mL KLEBSIELLA PNEUMONIAE  Blood culture (routine x 2)     Status: None   Collection Time: 06/08/19  7:02 PM   Specimen: BLOOD  Result Value Ref Range Status   Specimen Description BLOOD PICC LINE  Final   Special Requests Blood Culture adequate volume  Final   Culture   Final    NO GROWTH 5 DAYS Performed at [facility], [address] [place], [place] [address]    Report Status 06/13/2019 FINAL  Final    SARS CORONAVIRUS 2 (TAT 6-24 HRS) Nasopharyngeal Nasopharyngeal Swab  Status: Abnormal   Collection Time: 06/16/19  4:00 PM   Specimen: Nasopharyngeal Swab  Result Value Ref Range Status   SARS Coronavirus 2 POSITIVE (A) NEGATIVE Final    Comment: RESULT CALLED TO, READ BACK BY AND VERIFIED WITH: Roney [name] RN 10:20 06/17/19 ([email or web address]) (NOTE) SARS-CoV-2 target nucleic acids are DETECTED. The SARS-CoV-2 RNA is generally detectable in upper and lower respiratory specimens during the acute phase of infection. Positive results are indicative of active infection with SARS-CoV-2. Clinical  correlation with patient history and other diagnostic information is necessary to determine patient infection status. Positive results do  not rule out bacterial infection or co-infection with other viruses. The expected result is Negative. Fact Sheet for Patients: SugarRoll.be Fact Sheet for Healthcare Providers: https://www.woods-mathews.com/ This test is not yet approved or cleared by the [place] FDA and  has been authorized for detection and/or diagnosis of SARS-CoV-2 by FDA under an Emergency Use Authorization (EUA). This EUA will remain  in effect (meaning this test can be used) fo r the duration of the COVID-19 declaration under Section 564(b)(1) of the Act, 21 U.S.C. section 360bbb-3(b)(1), unless the authorization is terminated or revoked sooner. Performed at [facility], [place] [address] [place], [place] [address]     FURTHER DISCHARGE INSTRUCTIONS:  Get Medicines reviewed and adjusted: Please take all your medications with you for your next visit with your Primary MD  Laboratory/radiological data: Please request your Primary MD to go over all hospital tests and procedure/radiological results at the follow up, please ask your Primary MD to get all Hospital records sent to his/her office.  In some cases, they will be blood work,  cultures and biopsy results pending at the time of your discharge. Please request that your primary care M.D. goes through all the records of your hospital data and follows up on these results.  Also Note the following: If you experience worsening of your admission symptoms, develop shortness of breath, life threatening emergency, suicidal or homicidal thoughts you must seek medical attention immediately by calling 911 or calling your MD immediately  if symptoms less severe.  You must read complete instructions/literature along with all the possible adverse reactions/side effects for all the Medicines you take and that have been prescribed to you. Take any new Medicines after you have completely understood and accpet all the possible adverse reactions/side effects.   Do not drive when taking Pain medications or sleeping medications (Benzodaizepines)  Do not take more than prescribed Pain, Sleep and Anxiety Medications. It is not advisable to combine anxiety,sleep and pain medications without talking with your primary care practitioner  Special Instructions: If you have smoked or chewed Tobacco  in the last 2 yrs please stop smoking, stop any regular Alcohol  and or any Recreational drug use.  Wear Seat belts while driving.  Please note: You were cared for by a hospitalist during your hospital stay. Once you are discharged, your primary care physician will handle any further medical issues. Please note that NO REFILLS for any discharge medications will be authorized once you are discharged, as it is imperative that you return to your primary care physician (or establish a relationship with a primary care physician if you do not have one) for your post hospital discharge needs so that they can reassess your need for medications and monitor your lab values.  Total Time spent coordinating discharge including counseling, education and face to face time equals 35 minutes.  Signed: Shakirra Buehler 06/17/2019 11:00  AM

## 2019-06-17 NOTE — Progress Notes (Signed)
Discussed with patient discharge instructions, they verbalized agreement and understanding.  Report called to [facility].  Patient to leave by transport on stretcher, patient has no belongings.

## 2019-06-18 DIAGNOSIS — U071 COVID-19: Secondary | ICD-10-CM | POA: Diagnosis not present

## 2019-06-18 DIAGNOSIS — J449 Chronic obstructive pulmonary disease, unspecified: Secondary | ICD-10-CM | POA: Diagnosis not present

## 2019-06-18 DIAGNOSIS — D631 Anemia in chronic kidney disease: Secondary | ICD-10-CM | POA: Diagnosis not present

## 2019-06-18 DIAGNOSIS — R1114 Bilious vomiting: Secondary | ICD-10-CM | POA: Diagnosis not present

## 2019-06-18 DIAGNOSIS — B342 Coronavirus infection, unspecified: Secondary | ICD-10-CM | POA: Diagnosis not present

## 2019-06-18 DIAGNOSIS — S31109D Unspecified open wound of abdominal wall, unspecified quadrant without penetration into peritoneal cavity, subsequent encounter: Secondary | ICD-10-CM | POA: Diagnosis not present

## 2019-06-18 DIAGNOSIS — E1159 Type 2 diabetes mellitus with other circulatory complications: Secondary | ICD-10-CM | POA: Diagnosis not present

## 2019-06-18 DIAGNOSIS — I10 Essential (primary) hypertension: Secondary | ICD-10-CM | POA: Diagnosis not present

## 2019-06-18 DIAGNOSIS — I5032 Chronic diastolic (congestive) heart failure: Secondary | ICD-10-CM | POA: Diagnosis not present

## 2019-06-18 DIAGNOSIS — E1122 Type 2 diabetes mellitus with diabetic chronic kidney disease: Secondary | ICD-10-CM | POA: Diagnosis not present

## 2019-06-18 DIAGNOSIS — K219 Gastro-esophageal reflux disease without esophagitis: Secondary | ICD-10-CM | POA: Diagnosis not present

## 2019-06-18 DIAGNOSIS — J1289 Other viral pneumonia: Secondary | ICD-10-CM | POA: Diagnosis not present

## 2019-06-18 DIAGNOSIS — I130 Hypertensive heart and chronic kidney disease with heart failure and stage 1 through stage 4 chronic kidney disease, or unspecified chronic kidney disease: Secondary | ICD-10-CM | POA: Diagnosis not present

## 2019-06-18 DIAGNOSIS — L88 Pyoderma gangrenosum: Secondary | ICD-10-CM | POA: Diagnosis not present

## 2019-06-18 DIAGNOSIS — I4891 Unspecified atrial fibrillation: Secondary | ICD-10-CM | POA: Diagnosis not present

## 2019-06-18 DIAGNOSIS — E162 Hypoglycemia, unspecified: Secondary | ICD-10-CM | POA: Diagnosis not present

## 2019-06-18 DIAGNOSIS — N1831 Chronic kidney disease, stage 3a: Secondary | ICD-10-CM | POA: Diagnosis not present

## 2019-06-18 DIAGNOSIS — E11649 Type 2 diabetes mellitus with hypoglycemia without coma: Secondary | ICD-10-CM | POA: Diagnosis not present

## 2019-06-18 DIAGNOSIS — I5033 Acute on chronic diastolic (congestive) heart failure: Secondary | ICD-10-CM | POA: Diagnosis not present

## 2019-06-18 DIAGNOSIS — Z03818 Encounter for observation for suspected exposure to other biological agents ruled out: Secondary | ICD-10-CM | POA: Diagnosis not present

## 2019-06-18 DIAGNOSIS — E1165 Type 2 diabetes mellitus with hyperglycemia: Secondary | ICD-10-CM | POA: Diagnosis not present

## 2019-06-18 DIAGNOSIS — N390 Urinary tract infection, site not specified: Secondary | ICD-10-CM | POA: Diagnosis not present

## 2019-06-18 DIAGNOSIS — M810 Age-related osteoporosis without current pathological fracture: Secondary | ICD-10-CM | POA: Diagnosis not present

## 2019-06-18 DIAGNOSIS — L97121 Non-pressure chronic ulcer of left thigh limited to breakdown of skin: Secondary | ICD-10-CM | POA: Diagnosis not present

## 2019-06-18 DIAGNOSIS — L97111 Non-pressure chronic ulcer of right thigh limited to breakdown of skin: Secondary | ICD-10-CM | POA: Diagnosis not present

## 2019-06-18 DIAGNOSIS — G9340 Encephalopathy, unspecified: Secondary | ICD-10-CM | POA: Diagnosis not present

## 2019-06-18 DIAGNOSIS — N183 Chronic kidney disease, stage 3 unspecified: Secondary | ICD-10-CM | POA: Diagnosis not present

## 2019-06-18 DIAGNOSIS — I513 Intracardiac thrombosis, not elsewhere classified: Secondary | ICD-10-CM | POA: Diagnosis not present

## 2019-06-18 DIAGNOSIS — D8989 Other specified disorders involving the immune mechanism, not elsewhere classified: Secondary | ICD-10-CM | POA: Diagnosis not present

## 2019-06-20 ENCOUNTER — Non-Acute Institutional Stay (SKILLED_NURSING_FACILITY): Payer: BC Managed Care – PPO | Admitting: Adult Health

## 2019-06-20 ENCOUNTER — Encounter: Payer: Self-pay | Admitting: Adult Health

## 2019-06-20 ENCOUNTER — Other Ambulatory Visit (HOSPITAL_COMMUNITY): Payer: BC Managed Care – PPO

## 2019-06-20 ENCOUNTER — Other Ambulatory Visit (HOSPITAL_COMMUNITY)
Admission: AD | Admit: 2019-06-20 | Discharge: 2019-06-20 | Disposition: A | Discharge status: Home / Self Care | Payer: BC Managed Care – PPO | Source: Skilled Nursing Facility | Attending: Adult Health | Admitting: Adult Health

## 2019-06-20 DIAGNOSIS — I5033 Acute on chronic diastolic (congestive) heart failure: Secondary | ICD-10-CM | POA: Diagnosis not present

## 2019-06-20 DIAGNOSIS — J1289 Other viral pneumonia: Secondary | ICD-10-CM

## 2019-06-20 DIAGNOSIS — E559 Vitamin D deficiency, unspecified: Secondary | ICD-10-CM

## 2019-06-20 DIAGNOSIS — I4891 Unspecified atrial fibrillation: Secondary | ICD-10-CM | POA: Diagnosis not present

## 2019-06-20 DIAGNOSIS — Z794 Long term (current) use of insulin: Secondary | ICD-10-CM

## 2019-06-20 DIAGNOSIS — B342 Coronavirus infection, unspecified: Secondary | ICD-10-CM

## 2019-06-20 DIAGNOSIS — J449 Chronic obstructive pulmonary disease, unspecified: Secondary | ICD-10-CM

## 2019-06-20 DIAGNOSIS — N183 Chronic kidney disease, stage 3 unspecified: Secondary | ICD-10-CM | POA: Diagnosis not present

## 2019-06-20 DIAGNOSIS — N390 Urinary tract infection, site not specified: Secondary | ICD-10-CM

## 2019-06-20 DIAGNOSIS — J1282 Pneumonia due to coronavirus disease 2019: Secondary | ICD-10-CM

## 2019-06-20 DIAGNOSIS — U071 COVID-19: Secondary | ICD-10-CM

## 2019-06-20 DIAGNOSIS — L88 Pyoderma gangrenosum: Secondary | ICD-10-CM

## 2019-06-20 DIAGNOSIS — R1114 Bilious vomiting: Secondary | ICD-10-CM

## 2019-06-20 DIAGNOSIS — IMO0002 Reserved for concepts with insufficient information to code with codable children: Secondary | ICD-10-CM

## 2019-06-20 DIAGNOSIS — D6869 Other thrombophilia: Secondary | ICD-10-CM

## 2019-06-20 DIAGNOSIS — I152 Hypertension secondary to endocrine disorders: Secondary | ICD-10-CM

## 2019-06-20 DIAGNOSIS — N1831 Chronic kidney disease, stage 3a: Secondary | ICD-10-CM

## 2019-06-20 DIAGNOSIS — E1122 Type 2 diabetes mellitus with diabetic chronic kidney disease: Secondary | ICD-10-CM

## 2019-06-20 DIAGNOSIS — E1165 Type 2 diabetes mellitus with hyperglycemia: Secondary | ICD-10-CM

## 2019-06-20 DIAGNOSIS — I10 Essential (primary) hypertension: Secondary | ICD-10-CM

## 2019-06-20 DIAGNOSIS — E43 Unspecified severe protein-calorie malnutrition: Secondary | ICD-10-CM

## 2019-06-20 DIAGNOSIS — K219 Gastro-esophageal reflux disease without esophagitis: Secondary | ICD-10-CM

## 2019-06-20 DIAGNOSIS — E1159 Type 2 diabetes mellitus with other circulatory complications: Secondary | ICD-10-CM | POA: Diagnosis not present

## 2019-06-20 DIAGNOSIS — Z03818 Encounter for observation for suspected exposure to other biological agents ruled out: Secondary | ICD-10-CM | POA: Diagnosis not present

## 2019-06-20 DIAGNOSIS — L98492 Non-pressure chronic ulcer of skin of other sites with fat layer exposed: Secondary | ICD-10-CM

## 2019-06-20 DIAGNOSIS — D631 Anemia in chronic kidney disease: Secondary | ICD-10-CM

## 2019-06-20 LAB — BASIC METABOLIC PANEL
Anion gap: 10 (ref 5–15)
BUN: 31 mg/dL — ABNORMAL HIGH (ref 8–23)
CO2: 20 mmol/L — ABNORMAL LOW (ref 22–32)
Calcium: 7.1 mg/dL — ABNORMAL LOW (ref 8.9–10.3)
Chloride: 101 mmol/L (ref 98–111)
Creatinine, Ser: 0.98 mg/dL (ref 0.44–1.00)
GFR calc Af Amer: >60 mL/min (ref >60)
GFR calc non Af Amer: 59 mL/min — ABNORMAL LOW (ref >60)
Glucose, Bld: 132 mg/dL — ABNORMAL HIGH (ref 70–99)
Potassium: 4.9 mmol/L (ref 3.5–5.1)
Sodium: 131 mmol/L — ABNORMAL LOW (ref 135–145)

## 2019-06-20 NOTE — Progress Notes (Signed)
Location:    [place] Room Number: 158/P Place of Service:  SNF (31)   CODE STATUS: Full Code   Allergies  Allergen Reactions  . Feraheme [Ferumoxytol] Other (See Comments)    Back pain (yelling out with back pain)  . Propranolol Swelling    Pt states it may have been leg swelling  . Topamax [Topiramate] Other (See Comments)    hallucinations  . Latex Itching and Rash    Chief Complaint  Patient presents with  . Hospitalization Follow-up    Hospitalization Follow Up    HPI:  She has been hospitalized from 06-08-19 through 06-17-19. She was found to be COVD +; was treated for a complex uti . On 06-10-19 she developed transient slurred speech with a facial droop no acute stroke felt more likely from TIA or her metabolic encephalopathy. She was treated with meropenum. By ct scan she did have ground glass opacities in the right lower lung. She is covid positive she was not treated with steroids or remdiesivir.  She does have a cough; sore throat; no sputum production.   She is here for short term rehab. Her goal is to return back home. She will continue to be followed for her chronic illnesses including: copd; afib; diabetes.   Past Medical History:  Diagnosis Date  . Atrial fibrillation ([place])   . CHF (congestive heart failure) ([place])   . Depression   . Diabetes mellitus without complication ([place])   . 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 Hypertension   . Morbid obesity ([place])   . Osteoporosis   . Urinary incontinence   . UTI (lower urinary tract infection) 01/2016   Cipro for Klebsiella pneumoniae isolate  . Wheelchair bound    bedbound    Past Surgical History:  Procedure Laterality Date  . ABDOMINAL HYSTERECTOMY    . APPLICATION OF WOUND VAC  03/09/2019   Procedure: APPLICATION OF WOUND VAC;  Surgeon: Virl Cagey, MD;  Location: [facility] ORS;  Service: General;;  . BIOPSY  09/06/2018   Procedure: BIOPSY;   Surgeon: Danie Binder, MD;  Location: [facility] END[facility] SU[facility];  Service: Endoscopy;;  gastric bx's  . CESAREAN SECTION    . CHOLECYSTECTOMY    . ESOPHAGEAL DILATION  09/06/2018   Procedure: ESOPHAGEAL DILATION;  Surgeon: Danie Binder, MD;  Location: [facility];  Service: Endoscopy;;  . ESOPHAGOGASTRODUODENOSCOPY (EGD) WITH PROPOFOL N/A 09/06/2018   Procedure: ESOPHAGOGASTRODUODENOSCOPY (EGD) WITH PROPOFOL;  Surgeon: Danie Binder, MD;  Location: [facility];  Service: Endoscopy;  Laterality: N/A;  dilatation  . FEMUR IM NAIL Left 02/20/2016  . FEMUR IM NAIL Left 02/20/2016   Procedure: INTRAMEDULLARY (IM) RETROGRADE FEMORAL NAILING;  Surgeon: Leandrew Koyanagi, MD;  Location: [place];  Service: Orthopedics;  Laterality: Left;  . PANCREAS SURGERY  1967   1 cyst excised and one cyst drained  . TEE WITHOUT CARDIOVERSION N/A 04/05/2018   Procedure: TRANSESOPHAGEAL ECHOCARDIOGRAM (TEE);  Surgeon: Dorothy Spark, MD;  Location: [place];  Service: Cardiovascular;  Laterality: N/A;  . [place]    . WOUND DEBRIDEMENT N/A 03/09/2019   Procedure: EXCISIONAL DEBRIDEMENT OF ABDOMINAL WOUND ULCERS;  Surgeon: Virl Cagey, MD;  Location: [facility];  Service: General;  Laterality: N/A;  . WRIST FRACTURE SURGERY      Social History   Socioeconomic History  . Marital status: Married    Spouse name: 1  . Number  of children: Not on file  . Years of education: Not on file  . Highest education level: Not on file  Occupational History  . Occupation: retired  Scientific laboratory technician  . Financial resource strain: Not on file  . Food insecurity    Worry: Not on file    Inability: Not on file  . Transportation needs    Medical: No    Non-medical: No  Tobacco Use  . Smoking status: Never Smoker  . Smokeless tobacco: Never Used  Substance and Sexual Activity  . Alcohol use: No  . Drug use: No  . Sexual activity: Not on file  Lifestyle  . Physical activity    Days per week: Not on file     Minutes per session: Not on file  . Stress: Not on file  Relationships  . Social Herbalist on phone: Not on file    Gets together: Not on file    Attends religious service: Not on file    Active member of club or organization: Not on file    Attends meetings of clubs or organizations: Not on file    Relationship status: Not on file  . Intimate partner violence    Fear of current or ex partner: Not on file    Emotionally abused: Not on file    Physically abused: Not on file    Forced sexual activity: Not on file  Other Topics Concern  . Not on file  Social History Narrative   Lives in [place] with husband.  More or less w/c bound since leg fx about 3 yrs ago.   Family History  Problem Relation Age of Onset  . Diabetes Mother        died in her 19's of a stroke  . Cancer Mother   . Stroke Mother   . Breast cancer Mother   . Diabetes Father        died in his 66's of a stroke.  . Stroke Father   . Diabetes Brother        died @ 81 of a stroke.  . Stroke Brother   . Diabetes Maternal Grandmother   . Diabetes Maternal Grandfather   . Diabetes Paternal Grandmother   . Diabetes Paternal Grandfather   . Breast cancer Maternal Aunt       VITAL SIGNS BP 132/84   Pulse 62   Temp 97.9 F (36.6 C) (Oral)   Resp 20   Ht 5' 4"  (1.626 m)   Wt 188 lb 1.6 oz (85.3 kg)   BMI 32.29 kg/m   Outpatient Encounter Medications as of 06/20/2019  Medication Sig  . albuterol (PROVENTIL HFA;VENTOLIN HFA) 108 (90 Base) MCG/ACT inhaler Inhale 2 puffs into the lungs every 6 (six) hours as needed for wheezing or shortness of breath.  . Amino Acids-Protein Hydrolys (FEEDING SUPPLEMENT, PRO-STAT SUGAR FREE 64,) LIQD Take 30 mLs by mouth 3 (three) times daily.  Marland Kitchen apixaban (ELIQUIS) 5 MG TABS tablet Take 1 tablet (5 mg total) by mouth 2 (two) times daily.  . Ascorbic Acid (VITAMIN C) 1000 MG tablet Take 1,000 mg by mouth daily.  . bisacodyl (DULCOLAX) 10 MG suppository Place 10 mg  rectally as needed for moderate constipation.  . calcium carbonate (TUMS EX) 750 MG chewable tablet Chew 1 tablet by mouth 2 (two) times daily between meals.  Marland Kitchen diltiazem (CARDIZEM CD) 120 MG 24 hr capsule Take 1 capsule (120 mg total) by mouth daily.  . ergocalciferol (  VITAMIN D2) 1.25 MG (50000 UT) capsule Take 50,000 Units by mouth once a week. Wednesdays  . feeding supplement, ENSURE ENLIVE, (ENSURE ENLIVE) LIQD Take 237 mLs by mouth 3 (three) times daily between meals.  . ferrous sulfate (FERROUSUL) 325 (65 FE) MG tablet Take 1 tablet (325 mg total) by mouth 2 (two) times daily with a meal.  . fluticasone (FLONASE) 50 MCG/ACT nasal spray Place 2 sprays into both nostrils daily as needed for allergies or rhinitis (congestion).   . folic acid (FOLVITE) 1 MG tablet Take 1 tablet (1 mg total) by mouth daily.  . insulin aspart (NOVOLOG) 100 UNIT/ML injection 0-9 Units, Subcutaneous, 3 times daily with meals, First dose on Tue 06/14/19 at 1700 Correction coverage: Sensitive (thin, NPO, renal) CBG < 70: Implement Hypoglycemia measures CBG 70 - 120: 0 units CBG 121 - 150: 1 unit CBG 151 - 200: 2 units CBG 201 - 250: 3 units CBG 251 - 300: 5 units CBG 301 - 350: 7 units CBG 351 - 400: 9 units CBG > 400: call MD  . Insulin Glargine (LANTUS SOLOSTAR) 100 UNIT/ML Solostar Pen Inject 6 Units into the skin at bedtime.  . levalbuterol (XOPENEX) 0.63 MG/3ML nebulizer solution Take 0.63 mg by nebulization every 6 (six) hours as needed for wheezing or shortness of breath.   . metoprolol tartrate (LOPRESSOR) 50 MG tablet Take 50 mg by mouth 2 (two) times daily.   . Multiple Vitamin (MULTIVITAMIN WITH MINERALS) TABS tablet Take 1 tablet by mouth daily.  . NON FORMULARY Heart Healthy Low Sodium Diet  . NON FORMULARY Daily Weight Special Instructions: Notify MD for 3 lb weight gain in 1-2 days or 2 lbs overnight Once A Day 07:15 AM - 03:15 PM  . ondansetron (ZOFRAN) 4 MG tablet Take 4 mg by mouth 3  (three) times daily before meals.   . pantoprazole (PROTONIX) 40 MG tablet Take 1 tablet (40 mg total) by mouth daily before breakfast.  . polyethylene glycol (MIRALAX / GLYCOLAX) 17 g packet Take 17 g by mouth daily as needed for mild constipation.  . silver nitrate applicators 32-44 % applicator Apply topically as directed to abdominal wound as needed for excessive bleeding. As Needed  . SPIRIVA RESPIMAT 1.25 MCG/ACT AERS Inhale 1 puff into the lungs daily.   . Zinc 50 MG TABS Take 50 mg by mouth daily.  . [DISCONTINUED] acetaminophen (TYLENOL) 325 MG tablet Take 2 tablets (650 mg total) by mouth every 6 (six) hours as needed for mild pain or headache (fever >/= 101).  . [DISCONTINUED] zinc gluconate 50 MG tablet Take 50 mg by mouth daily.   No facility-administered encounter medications on file as of 06/20/2019.      SIGNIFICANT DIAGNOSTIC EXAMS   PREVIOUS;   05-09-19: chest x-ray: No acute disease   05-17-19: EEG: This study is suggestive of moderate diffuse encephalopathy, non specific to etiology.  No seizures or epileptiform discharges were seen throughout the recording.   05-17-19: ct of head: Examination is significantly limited by motion artifact throughout. Within this limitation, no acute intracranial pathology. Small-vessel white matter disease.  TODAY;   06-08-19: ct of abdomen and pelvis:  1. Resolved fluid collection in the subcutaneous tissues of the lower abdominal wall. Residual skin irregularity but no soft tissue thickening or inflammatory change. No recurrent or new abscess. 2. Clustered nodular ground-glass opacities in the right lower lobe with associated bronchial thickening, suspicious for aspiration. Or pneumonia also considered. 3. Mild bladder wall thickening inferiorly,  can be seen with urinary tract infection. Recommend correlation with urinalysis. 4. Colonic diverticulosis without diverticulitis. Aortic Atherosclerosis   06-08-19: chest x-ray; 1. No  active cardiopulmonary disease. 2. Aortic atherosclerosis.  06-10-19: 2-d echo:   1. Left ventricular ejection fraction, by visual estimation, is 65 to 70%. The left ventricle has normal function. Normal left ventricular size. Left ventricular septal wall thickness was mildly increased. Mildly increased left ventricular posterior  wall thickness.  2. Global right ventricle has normal systolic function.The right ventricular size is normal. No increase in right ventricular wall thickness.  3. Left atrial size was normal.  4. Right atrial size was normal.  5. The mitral valve is degenerative. Moderate mitral annular calcification. There is mild thickening of the mitral valve leaflet(s). There is mild calcification of the mitral valve leaflet(s). No evidence of mitral valve regurgitation. Mild mitral  stenosis.  6. The tricuspid valve is normal in structure. Tricuspid valve regurgitation is mild.  7. The aortic valve is normal in structure. Aortic valve regurgitation was not visualized by color flow Doppler. Structurally normal aortic valve, with no evidence of sclerosis or stenosis.  8. The pulmonic valve was normal in structure. Pulmonic valve regurgitation is trivial by color flow Doppler.  9. The inferior vena cava is normal in size with greater than 50% respiratory variability, suggesting right atrial pressure of 3 mmHg.  06-10-19: ct of head: 1. No acute or interval finding. ASPECTS is 10.   2. Atherosclerotic calcification.   06-11-19: MRI of brain" Negative for acute infarct  Atrophy and moderate chronic microvascular ischemia.   06-14-19: chest x-ray: Mild bibasilar atelectasis.    LABS REVIEWED PREVIOUS:   05-09-19:wbc 7.7; hgb 8.8; hct 31.4; mcv 89.5 ;plt 343; glucose 193; bun 9; creat 0.63; k+ 3.4; na++ 140; ca 7.5; liver normal albumin 2.1; BNP 461.0; hgb a1c 8.8 05-11-19: wbc 5.9; hgb 8.2; hct 29.4; mcv 89.1 plt 338; glucose 279; bun 12; creat 0.70; k+ 3.9; na++ 139; ca 7.3; mag 1.2  05-16-19: wbc 9.9; hgb 9.0; hct 30.3; mcv 85.1 pl 371; glucose 93; bun 10; creat 0.90; k+ 3.3; na++ 139; ca 8.1; mag 1.2 tsh 1.071 05-19-19: glucose 79; bun 8; creat 0.85; k+ 3.7; na++ 140; ca 7.9; mag 1.7 05-23-19: glucose 157; bun 13; creat 0.89; k+ 3.8; na++ 140; ca 7.9   TODAY;   06-01-19: glucose 109; bun 42; creat 1.35 ;k+ 5.2; na++ 137; ca 8.3; liver normal albumin 2.0; mag 1.3 06-07-19: wbc 15.1; hgb 10.6; hct 37.4; mcv 91.4 plt 526; glucose 305; bun 65; creat 2.24; k+ 6.5; na++ 131; ca 7.9; liver normal albumin 2.0; mag 1.5 urine culture: klebsiella pneumoniae ESBL 06-08-19: wbc 29.6; hgb 9.4; hct 31.6; mcv 87.1 plt 521; glucose 158; bun 62; creat 2.20; k+ 4.0; na++ 132; ca 7.5; mag 4.6; ferritin 181; CRP 11.7; blood culture: no growth; COVID 19 + 06-09-19: fibrinogen 347; d-dimer <0.27 06-10-19: wbc 22.5; hgb 7.3; hct 24.0; mcv 86.3 ;plt 329; glucose 117; bun 39; create 1.05; k+ 3.5; na++ 138; ca 7.0; liver normal albumin 1.3; vit B 12: 1391; folate 5.1; tsh 0.456 06-11-19: hgb a1c 7.4; chol 53; ldl 26; trig 86; hdl 11 06-12-19: wbc 10.3; hgb 7.4; hct 24.;1 mcv 85.2 ;plt 275; glucose 225; bun 24; creat 0.79; k+ 4.1; na++ 140; ca 7.6; liver normal albumin 1.2; mag 1.8; LDH 274; CRP 15.8 06-13-28: wbc 10.8; hgb 9.5; hct 30.4; mcv 89.1 plt 224; glucose 71; bun 26; creat 0.65; k+ 3.6; an++ 138; ca  7.2; liver normal albumin 1.3; CRP 9.4; ferritin 311 06-16-19: wbc 6.9; hgb 9.5; hct 30.2; mcv 88.6 ;plt 229; glucose 121; bun 22; creat 0.56; k+ 3.5; an++ 136; ca 7.0; liver normal albumin 3.1 CRP 9.4    Review of Systems  Constitutional: Positive for malaise/fatigue.  HENT: Positive for sore throat.   Respiratory: Positive for cough. Negative for shortness of breath.   Cardiovascular: Negative for chest pain, palpitations and leg swelling.  Gastrointestinal: Positive for abdominal pain, heartburn and nausea. Negative for constipation and vomiting.  Musculoskeletal: Positive for myalgias.  Negative for back pain and joint pain.  Skin: Negative.   Neurological: Positive for weakness. Negative for dizziness.  Psychiatric/Behavioral: The patient is not nervous/anxious.     Physical Exam Constitutional:      General: She is not in acute distress.    Appearance: She is well-developed. She is obese. She is not diaphoretic.  Neck:     Musculoskeletal: Neck supple.     Thyroid: No thyromegaly.  Cardiovascular:     Rate and Rhythm: Regular rhythm. Tachycardia present.     Pulses: Normal pulses.     Heart sounds: Normal heart sounds.  Pulmonary:     Effort: Pulmonary effort is normal. No respiratory distress.     Breath sounds: Normal breath sounds.  Abdominal:     General: Bowel sounds are normal. There is no distension.     Palpations: Abdomen is soft.     Tenderness: There is abdominal tenderness.     Comments: Has generalized tenderness to mild palpation  History of esophageal dilation   Musculoskeletal:     Right lower leg: No edema.     Left lower leg: No edema.     Comments: Is able to move all extremities  History of left hip IM nail   Lymphadenopathy:     Cervical: No cervical adenopathy.  Skin:    General: Skin is warm and dry.     Comments: Abdominal incisional dressing intact Has bruising Has hematoma right thigh   Neurological:     Mental Status: She is alert and oriented to person, place, and time.  Psychiatric:        Mood and Affect: Mood normal.       ASSESSMENT/ PLAN:  TODAY;   1. Pneumonia due to covid 19 virus/ coronavirus: is stable she did a coarse of meropenum in the hospital. She is use her flutter valve and I/S every 2 hours and she is not to lay on her back.   2. Hypercoagulable state due to covid 19: is worse; I have spoken with Dr. Sheppard Coil: will stop eliquis at this time will begin her on lovenox 80 mg twice daily and will monitor her status.   3. Atrial fibrillation with tachycardic ventricular rate: she is tachycardic; will  change her to lovenox 80 mg twice daily; will change her cardizem cd to 180 mg daily will continue lopressor 50 mg twice daily  monitor her status.   4. Acute on chronic diastolic CHF (congestive heart failure) is stable EF 65-70% (06-10-19) will continue lopressor 50 mg twice daily is not on diuretic at this time  5. Hypertension associated with type 2 diabetes mellitus: is stable b/p 132/84: will continue lopressor 50 mg twice daily   6. Chronic obstructive pulmonary disease unspecified copd type: is stable will continue spiriva respimet 1.25 mcg 1 puff daily; albuterol 2 puffs every 6 hours as needed has xopenex neb every 6 hours as needed  7.  GERD without esophagitis/nausea and vomiting: worse: will increase protonix to 40 mg twice daily will continue zofran 5 mg prior to meals for her nausea  Will being NS at 50cc per hour to maintain hydration  8. Uncontrolled type 2 diabetes mellitus with chronic kidney disease with  long term current of insulin: hgb a1c 7.4 will continue lantus 6 units nightly will change novolog to 3 units with meals for cbg >150 and more than 50 % of meals.   9. CKD stage 3 due to type 2 diabetes mellitus: is stable bun 22 creat 0.56 will monitor   10. 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  Anemia due to stage 3a chronic kidney disease: is stable hgb 9.5 is status post PRBC transfusion; will continue iron twice daily   12. Acute lower UTI: klebsiella pneumoniae ESBL has completed abt and will monitor her status.   13. Protein calorie malnutrition severe: albumin 1.3 will continue prostat 30 cc three times daily and ensure 3 times daily   14. Hypocalcemia: is without change: ca 7.0 will increase tums 750 mg three times daily will monitor  15. Vit D deficiency is stable will continue vitamin D 50,000 units weekly will monitor    Will repeat BMP and d-dimer will  monitor      MD is aware of resident's narcotic use and is in agreement with current plan of care. We will attempt to wean resident as appropriate.  Ok Edwards NP [facility]  Contact [phone] Monday through Friday 8am- 5pm  After hours call [phone]

## 2019-06-21 ENCOUNTER — Encounter (HOSPITAL_COMMUNITY)
Admission: RE | Admit: 2019-06-21 | Discharge: 2019-06-21 | Disposition: A | Discharge status: Home / Self Care | Payer: BC Managed Care – PPO | Source: Skilled Nursing Facility | Attending: Adult Health | Admitting: Adult Health

## 2019-06-21 DIAGNOSIS — J1282 Pneumonia due to coronavirus disease 2019: Secondary | ICD-10-CM | POA: Insufficient documentation

## 2019-06-21 DIAGNOSIS — E559 Vitamin D deficiency, unspecified: Secondary | ICD-10-CM | POA: Insufficient documentation

## 2019-06-21 DIAGNOSIS — E162 Hypoglycemia, unspecified: Secondary | ICD-10-CM | POA: Insufficient documentation

## 2019-06-21 DIAGNOSIS — K5790 Diverticulosis of intestine, part unspecified, without perforation or abscess without bleeding: Secondary | ICD-10-CM | POA: Insufficient documentation

## 2019-06-21 DIAGNOSIS — D6869 Other thrombophilia: Secondary | ICD-10-CM | POA: Insufficient documentation

## 2019-06-21 DIAGNOSIS — G9340 Encephalopathy, unspecified: Secondary | ICD-10-CM | POA: Insufficient documentation

## 2019-06-21 DIAGNOSIS — U071 COVID-19: Secondary | ICD-10-CM | POA: Insufficient documentation

## 2019-06-21 DIAGNOSIS — B961 Klebsiella pneumoniae [K. pneumoniae] as the cause of diseases classified elsewhere: Secondary | ICD-10-CM | POA: Insufficient documentation

## 2019-06-21 LAB — D-DIMER, QUANTITATIVE: D-Dimer, Quant: 0.34 ug/mL-FEU (ref 0.00–0.50)

## 2019-06-22 ENCOUNTER — Non-Acute Institutional Stay (SKILLED_NURSING_FACILITY): Payer: BC Managed Care – PPO | Admitting: Internal Medicine

## 2019-06-22 ENCOUNTER — Encounter: Payer: Self-pay | Admitting: Internal Medicine

## 2019-06-22 ENCOUNTER — Ambulatory Visit: Admit: 2019-06-22 | Payer: BC Managed Care – PPO | Admitting: General Surgery

## 2019-06-22 DIAGNOSIS — L88 Pyoderma gangrenosum: Secondary | ICD-10-CM

## 2019-06-22 DIAGNOSIS — N1831 Chronic kidney disease, stage 3a: Secondary | ICD-10-CM

## 2019-06-22 DIAGNOSIS — J1289 Other viral pneumonia: Secondary | ICD-10-CM

## 2019-06-22 DIAGNOSIS — E1165 Type 2 diabetes mellitus with hyperglycemia: Secondary | ICD-10-CM

## 2019-06-22 DIAGNOSIS — U071 COVID-19: Secondary | ICD-10-CM | POA: Diagnosis not present

## 2019-06-22 DIAGNOSIS — E1122 Type 2 diabetes mellitus with diabetic chronic kidney disease: Secondary | ICD-10-CM

## 2019-06-22 DIAGNOSIS — Z794 Long term (current) use of insulin: Secondary | ICD-10-CM

## 2019-06-22 DIAGNOSIS — B9689 Other specified bacterial agents as the cause of diseases classified elsewhere: Secondary | ICD-10-CM

## 2019-06-22 DIAGNOSIS — B342 Coronavirus infection, unspecified: Secondary | ICD-10-CM

## 2019-06-22 DIAGNOSIS — I4821 Permanent atrial fibrillation: Secondary | ICD-10-CM

## 2019-06-22 DIAGNOSIS — N179 Acute kidney failure, unspecified: Secondary | ICD-10-CM

## 2019-06-22 DIAGNOSIS — N390 Urinary tract infection, site not specified: Secondary | ICD-10-CM

## 2019-06-22 DIAGNOSIS — G9340 Encephalopathy, unspecified: Secondary | ICD-10-CM

## 2019-06-22 DIAGNOSIS — N1832 Chronic kidney disease, stage 3b: Secondary | ICD-10-CM

## 2019-06-22 DIAGNOSIS — I5032 Chronic diastolic (congestive) heart failure: Secondary | ICD-10-CM

## 2019-06-22 DIAGNOSIS — D689 Coagulation defect, unspecified: Secondary | ICD-10-CM

## 2019-06-22 DIAGNOSIS — S31109S Unspecified open wound of abdominal wall, unspecified quadrant without penetration into peritoneal cavity, sequela: Secondary | ICD-10-CM

## 2019-06-22 DIAGNOSIS — IMO0002 Reserved for concepts with insufficient information to code with codable children: Secondary | ICD-10-CM

## 2019-06-22 DIAGNOSIS — D631 Anemia in chronic kidney disease: Secondary | ICD-10-CM

## 2019-06-22 DIAGNOSIS — J1282 Pneumonia due to coronavirus disease 2019: Secondary | ICD-10-CM

## 2019-06-22 SURGERY — REPAIR, HERNIA, UMBILICAL, ADULT
Anesthesia: General

## 2019-06-22 NOTE — Progress Notes (Signed)
: Provider:  Hennie Duos., MD Location:  [place] Room Number: 158-P Place of Service:  SNF (31)  PCP: [name], MD Patient Care Team: [name], MD as PCP - General (Internal Medicine) Herminio Commons, MD as PCP - Cardiology (Cardiology) Michel Bickers, MD as Consulting Physician (Infectious Diseases) Derek Jack, MD as Consulting Physician (Hematology) Virl Cagey, MD as Consulting Physician (General Surgery)  Extended Emergency Contact Information Primary Emergency Contact: Madry,John R Address: [address]          [place], [place] [address] Johnnette Litter of [place] Phone: [phone] Mobile Phone: [phone] Relation: Spouse     Allergies: Feraheme [ferumoxytol], Propranolol, Topamax [topiramate], and Latex  Chief Complaint  Patient presents with   Readmit To SNF    Readmission to [facility]    HPI: Patient is 68 y.o. female with pyoderma gangrenosum abdominal wall, MRSA infection, diastolic congestive heart failure, atrial fibrillation, COPD, CKD 3, and hypertension, who was sent to the ED via EMS with reports of mental status change and hypoglycemia.  Patient tested positive for COVID-19 on 10/20, yesterday.  She reported mild cough and mild difficulty breathing with sore throat.  She appeared mildly confused but could answer some questions appropriately in the ED.  In the ED temperature was normal heart rate 130, systolic blood pressure 865 23, O2 sats greater than 92% on room air, WBC 29,000 serum bicarb 18 creatinine 2.2.  Portable chest x-ray negative for acute abnormality.  Patient was admitted to [facility] from 10/21-30 for acute metabolic encephalopathy ESBL Klebsiella pneumoniae UTI and anemia requiring transfusion.  Patient is admitted back to skilled nursing facility for OT/PT.  While at skilled nursing facility patient will be followed for atrial fibrillation  treated with metoprolol and Lovenox, diabetes mellitus type 2 treated with insulin and chronic diastolic congestive heart failure treated with metoprolol.  Past Medical History:  Diagnosis Date   Atrial fibrillation (HCC)    CHF (congestive heart failure) ([place])    Depression    Diabetes mellitus without complication ([place])    History of transesophageal echocardiography (TEE) 03/2018   LV thrombus   Hypertension    Morbid obesity ([place])    Osteoporosis    Urinary incontinence    UTI (lower urinary tract infection) 01/2016   Cipro for Klebsiella pneumoniae isolate   Wheelchair bound    bedbound    Past Surgical History:  Procedure Laterality Date   ABDOMINAL HYSTERECTOMY     APPLICATION OF WOUND VAC  03/09/2019   Procedure: APPLICATION OF WOUND VAC;  Surgeon: Virl Cagey, MD;  Location: [facility] ORS;  Service: General;;   BIOPSY  09/06/2018   Procedure: BIOPSY;  Surgeon: Danie Binder, MD;  Location: [facility] ENDO SUITE;  Service: Endoscopy;;  gastric bx's   CESAREAN SECTION     CHOLECYSTECTOMY     ESOPHAGEAL DILATION  09/06/2018   Procedure: ESOPHAGEAL DILATION;  Surgeon: Danie Binder, MD;  Location: [facility] END[facility] SU[facility];  Service: Endoscopy;;   ESOPHAGOGASTRODUODENOSCOPY (EGD) WITH PROPOFOL N/A 09/06/2018   Procedure: ESOPHAGOGASTRODUODENOSCOPY (EGD) WITH PROPOFOL;  Surgeon: Danie Binder, MD;  Location: [facility] END[facility];  Service: Endoscopy;  Laterality: N/A;  dilatation   FEMUR IM NAIL Left 02/20/2016   FEMUR IM NAIL Left 02/20/2016   Procedure: INTRAMEDULLARY (IM) RETROGRADE FEMORAL NAILING;  Surgeon: Leandrew Koyanagi, MD;  Location: [facility];  Service: Orthopedics;  Laterality: Left;   [place]  1 cyst excised and one cyst drained   TEE WITHOUT CARDIOVERSION N/A 04/05/2018   Procedure: TRANSESOPHAGEAL ECHOCARDIOGRAM (TEE);  Surgeon: Dorothy Spark, MD;  Location: [facility];  Service: Cardiovascular;  Laterality: N/A;   VEIN LIGATION AND STRIPPING      WOUND DEBRIDEMENT N/A 03/09/2019   Procedure: EXCISIONAL DEBRIDEMENT OF ABDOMINAL WOUND ULCERS;  Surgeon: Virl Cagey, MD;  Location: [facility];  Service: General;  Laterality: N/A;   WRIST FRACTURE SURGERY      Allergies as of 06/22/2019      Reactions   Feraheme [ferumoxytol] Other (See Comments)   Back pain (yelling out with back pain)   Propranolol Swelling   Pt states it may have been leg swelling   Topamax [topiramate] Other (See Comments)   hallucinations   Latex Itching, Rash      Medication List    Notice   This visit is during an admission. Changes to the med list made in this visit will be reflected in the After Visit Summary of the admission.     No orders of the defined types were placed in this encounter.   Immunization History  Administered Date(s) Administered   Influenza, High Dose Seasonal PF 04/23/2018   PPD Test 02/22/2016   Td 01/08/2018    Social History   Tobacco Use   Smoking status: Never Smoker   Smokeless tobacco: Never Used  Substance Use Topics   Alcohol use: No    Family history is   Family History  Problem Relation Age of Onset   Diabetes Mother        died in her 63's of a stroke   Cancer Mother    Stroke Mother    Breast cancer Mother    Diabetes Father        died in his 27's of a stroke.   Stroke Father    Diabetes Brother        died @ 36 of a stroke.   Stroke Brother    Diabetes Maternal Grandmother    Diabetes Maternal Grandfather    Diabetes Paternal Grandmother    Diabetes Paternal Grandfather    Breast cancer Maternal Aunt       Review of Systems  DATA OBTAINED: from patient, nurse GENERAL:  no fevers, fatigue, appetite changes SKIN: No itching, or rash EYES: No eye pain, redness, discharge EARS: No earache, tinnitus, change in hearing NOSE: No congestion, drainage or bleeding  MOUTH/THROAT: No mouth or tooth pain, No sore throat RESPIRATORY: No cough, wheezing, SOB CARDIAC: No  chest pain, palpitations, lower extremity edema  GI: No abdominal pain, No N/V/D or constipation, No heartburn or reflux  GU: No dysuria, frequency or urgency, or incontinence  MUSCULOSKELETAL: No unrelieved bone/joint pain NEUROLOGIC: No headache, dizziness or focal weakness PSYCHIATRIC: No c/o anxiety or sadness   Vitals:   06/22/19 0814  BP: (!) 141/79  Pulse: 89  Resp: 20  Temp: 97.9 F (36.6 C)    SpO2 Readings from Last 1 Encounters:  06/17/19 100%   Body mass index is 32.18 kg/m.     Physical Exam  GENERAL APPEARANCE: Alert, conversant,  No acute distress.  Obese white female SKIN: No diaphoresis rash HEAD: Normocephalic, atraumatic  EYES: Conjunctiva/lids clear. Pupils round, reactive. EOMs intact.  EARS: External exam WNL, canals clear. Hearing grossly normal.  NOSE: No deformity or discharge.  MOUTH/THROAT: Lips w/o lesions  RESPIRATORY: Breathing is even, unlabored. Lung sounds are coarse breath sounds, full expiratory breath  sounds with slight wheeze CARDIOVASCULAR: Heart RRR no murmurs, rubs or gallops.  Trace-1+ peripheral edema.   GASTROINTESTINAL: Abdomen is soft, non-tender, not distended w/ normal bowel sounds; incision right lower quadrant packed dressed GENITOURINARY: Bladder non tender, not distended  MUSCULOSKELETAL: No abnormal joints or musculature NEUROLOGIC:  Cranial nerves 2-12 grossly intact. Moves all extremities  PSYCHIATRIC: Mood and affect appropriate to situation, no behavioral issues  Patient Active Problem List   Diagnosis Date Noted   Pneumonia due to COVID-19 virus 06/21/2019   Hypercoagulable state associated with COVID-19 ([place]) 06/21/2019   Hypocalcemia 06/21/2019   Vitamin D deficiency 06/21/2019   Acute renal failure superimposed on stage 3 chronic kidney disease ([place]) 06/11/2019   Hypomagnesemia 06/11/2019   Hyperkalemia 06/11/2019   Chronic constipation 06/11/2019   Coronavirus infection 06/11/2019   Acute  delirium 06/03/2019   Hypertension associated with type 2 diabetes mellitus ([place]) 05/31/2019   GERD without esophagitis 05/31/2019   CKD stage 3 due to type 2 diabetes mellitus ([place]) 05/31/2019   Anemia due to stage 3a chronic kidney disease 05/31/2019   Protein-calorie malnutrition, severe ([place]) 05/31/2019   Acute encephalopathy 05/25/2019   Atrial fibrillation with tachycardic ventricular rate ([place]) 04/28/2019   Uncontrolled type 2 diabetes mellitus with chronic kidney disease, with long-term current use of insulin ([place]) 04/22/2019   Pyoderma gangrenosum 03/11/2019   Wound infection 03/09/2019   Chronic wound infection of abdomen    Wheelchair bound    Abdominal wall skin ulcer ([place]) 03/07/2019   Acute pulmonary edema ([place]) 02/17/2019   Iron deficiency anemia 02/01/2019   Abdominal wall ulcer, with fat layer exposed ([place]) 01/16/2019   Elevated troponin    Nausea and vomiting    Symptomatic bradycardia 01/10/2019   DKA (diabetic ketoacidoses) ([place]) 01/10/2019   Urinary incontinence 12/28/2018   Recurrent urinary tract infection 12/16/2018   Wound, open, anterior abdominal wall 12/05/2018   Hypotension due to hypovolemia    Gastritis due to nonsteroidal anti-inflammatory drug    Esophageal dysphagia    GI bleed 09/03/2018   Coagulopathy ([place])    Colitis    Lower abdominal pain    Rectal bleeding    Dehydration 08/16/2018   Acute on chronic diastolic CHF (congestive heart failure) ([place]) 08/10/2018   Acute lower UTI 08/10/2018   Hypokalemia 08/10/2018   COPD (chronic obstructive pulmonary disease) ([place]) 08/10/2018   Thyroid lesion 08/10/2018   Closed fracture of right ankle 06/10/2018   Closed fracture of left tibia and fibula with routine healing 04/13/18 06/10/2018   Atrial fibrillation ([place]) 04/15/2018   Chronic diastolic HF (heart failure) ([place]) 04/15/2018   CKD (chronic kidney disease), stage III 04/15/2018   Left tibial fracture  04/14/2018   CHF exacerbation ([place]) 04/10/2018   SOB (shortness of breath)    Acute CHF (congestive heart failure) ([place]) 04/01/2018   Atrial fibrillation with RVR ([place]) 04/01/2018   [place] 03/11/2016   Muscle spasms of lower extremity 03/04/2016   Acute renal insufficiency 02/26/2016   History of MRSA infection 02/26/2016   HTN (hypertension) 02/19/2016   Fracture, femur, distal ([place]) 02/19/2016   Fall 02/19/2016   Depression 02/19/2016   Femur fracture, left ([place]) 02/19/2016      Labs reviewed: Basic Metabolic Panel:    Component Value Date/Time   NA 131 (L) 06/20/2019 1915   K 4.9 06/20/2019 1915   CL 101 06/20/2019 1915   CO2 20 (L) 06/20/2019 1915   GLUCOSE 132 (H) 06/20/2019 1915   BUN 31 (H) 06/20/2019  1915   CREATININE 0.98 06/20/2019 1915   CREATININE 0.81 01/25/2019 1354   CALCIUM 7.1 (L) 06/20/2019 1915   PROT 4.2 (L) 06/16/2019 0501   ALBUMIN 1.3 (L) 06/16/2019 0501   AST 16 06/16/2019 0501   AST 7 (L) 01/25/2019 1354   ALT 26 06/16/2019 0501   ALT <6 01/25/2019 1354   ALKPHOS 105 06/16/2019 0501   BILITOT 0.3 06/16/2019 0501   BILITOT 0.7 01/25/2019 1354   GFRNONAA 59 (L) 06/20/2019 1915   GFRNONAA >60 01/25/2019 1354   GFRAA >60 06/20/2019 1915   GFRAA >60 01/25/2019 1354    Recent Labs    06/12/19 0549 06/13/19 0755 06/14/19 0500 06/15/19 0415 06/16/19 0501 06/20/19 1915  NA 140 143 139 138 136 131*  K 4.1 4.0 3.9 3.6 3.5 4.9  CL 113* 115* 112* 110 108 101  CO2 21* 21* 21* 22 22 20*  GLUCOSE 225* 174* 312* 71 121* 132*  BUN 24* 24* 28* 26* 22 31*  CREATININE 0.79 0.90 0.80 0.65 0.56 0.98  CALCIUM 7.6* 7.4* 7.3* 7.2* 7.0* 7.1*  MG 1.8 1.8 1.5*  --   --   --    Liver Function Tests: Recent Labs    06/14/19 0500 06/15/19 0415 06/16/19 0501  AST 21 17 16   ALT 27 26 26   ALKPHOS 112 98 105  BILITOT 0.6 0.4 0.3  PROT 4.0* 4.1* 4.2*  ALBUMIN 1.2* 1.3* 1.3*   Recent Labs    01/10/19 1646  LIPASE 17   Recent Labs     08/19/18 1026 05/16/19 1135 06/08/19 1850  AMMONIA <9* 16 29   CBC: Recent Labs    06/13/19 0755 06/14/19 0500 06/14/19 2300 06/15/19 0415 06/16/19 0501  WBC 8.7 9.6 10.8* 10.1 6.9  NEUTROABS 6.2 7.1 8.2*  --   --   HGB 7.2* 6.8* 9.5* 9.2* 9.5*  HCT 24.6* 22.9* 30.4* 29.5* 30.2*  MCV 87.2 88.8 89.1 88.9 88.6  PLT 264 246 224 227 229   Lipid Recent Labs    06/11/19 0451  CHOL 53  HDL 11*  LDLCALC 25  TRIG 86    Cardiac Enzymes: Recent Labs    08/16/18 1848 01/10/19 1646 01/10/19 2020  TROPONINI <0.03 0.29* 0.25*   BNP: Recent Labs    01/10/19 1646 02/17/19 1522 05/09/19 1750  BNP 2,107.0* 1,510.0* 461.0*   No results found for: [facility] Lab Results  Component Value Date   HGBA1C 7.4 (H) 06/11/2019   Lab Results  Component Value Date   TSH 0.456 06/10/2019   Lab Results  Component Value Date   VITAMINB12 1,391 (H) 06/10/2019   Lab Results  Component Value Date   FOLATE 5.1 (L) 06/10/2019   Lab Results  Component Value Date   IRON 11 (L) 01/12/2019   TIBC 343 01/12/2019   FERRITIN 326 (H) 06/16/2019    Imaging and Procedures obtained prior to SNF admission: No results found.   Not all labs, radiology exams or other studies done during hospitalization come through on my EPIC note; however they are reviewed by me.    Assessment and Plan  Acute metabolic encephalopathy-etiology thought to be multifactorial from hypoglycemia and UTI SNF-admitted back to SNF for continued OT/PT transient slurred speech/right facial droop on 10/23 likely secondary to TIA or ongoing lingering metabolic encephalopathy-patient underwent extensive work-up including neurology evaluation, MRI brain TTE with preserved EF, carotid ultrasound without significant stenosis, neurology signed off  COVID-19 infection/COVID-19 pneumonia-2 chest x-rays in the hospital did not show  pneumonia patient remained on room air, her CRP was elevated thought secondary to chronic  abdominal wound SNF-as stated in history patient complained of cough and mild shortness of breath on presentation and although her chest x-ray did not show anything her CT of the abdomen showed groundglass opacities in her right lung base; in the setting of Covid I would consider this a Covid pneumonia.  Fortunately patient did not require oxygen or other interventions for her lungs, unfortunately patient continues to have a very very poor appetite and is requiring some IV fluid support  ESBL Klebsiella pneumoniae UTI-completed meropenem x7 days  AKI on CKD stage III-improved with supportive care with discharge creatinine close to baseline SNF-follow-up BMP  Chronic abdominal wound/history of pyoderma gangrenosum-CT of abdomen and pelvis without any evidence of abscess; history of prior wound infection treated with IV antibiotics for 3 weeks a month or so ago; patient did undergo simple bedside debridement with general surgery with antimicrobial therapy with meropenem and local wound care  Anemia-patient received 2 units PRBC on 10/27 SNF-follow-up CBC  Diabetes mellitus type 2-A1c 7.4; patient has several hypoglycemic episodes during hospital stay and Lantus was decreased to 6 units SNF-with Covid infection expect appetite to return slowly will monitor blood sugars  Chronic atrial fib SNF-continue metoprolol 50 mg twice daily for rate control and patient is currently on Lovenox 80 mg Every 12 hours while her blood is hypercoagulable, secondary to Covid  Hypercoagulability SNF-probably secondary to Covid; patient's blood clotted in the tube and her blood was being drawn while she was on Eliquis, therefore she was changed to Lovenox, per pulmonology consult    Time spent greater than 45 minutes;> 50% of time with patient was spent reviewing records, labs, tests and studies, counseling and developing plan of care  Hennie Duos, MD

## 2019-06-23 ENCOUNTER — Encounter: Payer: Self-pay | Admitting: Adult Health

## 2019-06-23 ENCOUNTER — Non-Acute Institutional Stay (SKILLED_NURSING_FACILITY): Payer: BC Managed Care – PPO | Admitting: Adult Health

## 2019-06-23 DIAGNOSIS — N1831 Chronic kidney disease, stage 3a: Secondary | ICD-10-CM | POA: Diagnosis not present

## 2019-06-23 DIAGNOSIS — D631 Anemia in chronic kidney disease: Secondary | ICD-10-CM | POA: Diagnosis not present

## 2019-06-23 DIAGNOSIS — E1122 Type 2 diabetes mellitus with diabetic chronic kidney disease: Secondary | ICD-10-CM | POA: Diagnosis not present

## 2019-06-23 DIAGNOSIS — E1165 Type 2 diabetes mellitus with hyperglycemia: Secondary | ICD-10-CM | POA: Diagnosis not present

## 2019-06-23 DIAGNOSIS — N183 Chronic kidney disease, stage 3 unspecified: Secondary | ICD-10-CM

## 2019-06-23 DIAGNOSIS — IMO0002 Reserved for concepts with insufficient information to code with codable children: Secondary | ICD-10-CM

## 2019-06-23 DIAGNOSIS — Z794 Long term (current) use of insulin: Secondary | ICD-10-CM

## 2019-06-23 NOTE — Progress Notes (Signed)
Location:    [facility] Room Number: 158/P Place of Service:  SNF (31)   CODE STATUS: Full Code  Allergies  Allergen Reactions   Feraheme [Ferumoxytol] Other (See Comments)    Back pain (yelling out with back pain)   Propranolol Swelling    Pt states it may have been leg swelling   Topamax [Topiramate] Other (See Comments)    hallucinations   Latex Itching and Rash    Chief Complaint  Patient presents with   Acute Visit    Status    HPI:  She has had low cbg readings. She is presently taking lantus daily.  She continues to have nausea. Her appetite remains poor. She was changed to D5 NS yesterday. She had gained weight from 187 pounds to 191 pounds. She denies any worsening shortness of breath. She does not have worsening edema.    Past Medical History:  Diagnosis Date   Atrial fibrillation (HCC)    CHF (congestive heart failure) ([place])    Depression    Diabetes mellitus without complication ([place])    History of transesophageal echocardiography (TEE) 03/2018   LV thrombus   Hypertension    Morbid obesity ([place])    Osteoporosis    Urinary incontinence    UTI (lower urinary tract infection) 01/2016   Cipro for Klebsiella pneumoniae isolate   Wheelchair bound    bedbound    Past Surgical History:  Procedure Laterality Date   ABDOMINAL HYSTERECTOMY     APPLICATION OF WOUND VAC  03/09/2019   Procedure: APPLICATION OF WOUND VAC;  Surgeon: Virl Cagey, MD;  Location: [facility];  Service: General;;   BIOPSY  09/06/2018   Procedure: BIOPSY;  Surgeon: Danie Binder, MD;  Location: [facility];  Service: Endoscopy;;  gastric bx's   CESAREAN SECTION     CHOLECYSTECTOMY     ESOPHAGEAL DILATION  09/06/2018   Procedure: ESOPHAGEAL DILATION;  Surgeon: Danie Binder, MD;  Location: [facility];  Service: Endoscopy;;   ESOPHAGOGASTRODUODENOSCOPY (EGD) WITH PROPOFOL N/A 09/06/2018   Procedure: ESOPHAGOGASTRODUODENOSCOPY (EGD)  WITH PROPOFOL;  Surgeon: Danie Binder, MD;  Location: [facility];  Service: Endoscopy;  Laterality: N/A;  dilatation   FEMUR IM NAIL Left 02/20/2016   FEMUR IM NAIL Left 02/20/2016   Procedure: INTRAMEDULLARY (IM) RETROGRADE FEMORAL NAILING;  Surgeon: Leandrew Koyanagi, MD;  Location: [facility];  Service: Orthopedics;  Laterality: Left;   PANCREAS SURGERY  1967   1 cyst excised and one cyst drained   TEE WITHOUT CARDIOVERSION N/A 04/05/2018   Procedure: TRANSESOPHAGEAL ECHOCARDIOGRAM (TEE);  Surgeon: Dorothy Spark, MD;  Location: [facility] ENDOSCOPY;  Service: Cardiovascular;  Laterality: N/A;   VEIN LIGATION AND STRIPPING     WOUND DEBRIDEMENT N/A 03/09/2019   Procedure: EXCISIONAL DEBRIDEMENT OF ABDOMINAL WOUND ULCERS;  Surgeon: Virl Cagey, MD;  Location: [facility];  Service: General;  Laterality: N/A;   WRIST FRACTURE SURGERY      Social History   Socioeconomic History   Marital status: Married    Spouse name: 1   Number of children: Not on file   Years of education: Not on file   Highest education level: Not on file  Occupational History   Occupation: retired  Scientist, product/process development strain: Not on file   Food insecurity    Worry: Not on file    Inability: Not on file   Transportation needs    Medical: No  Non-medical: No  Tobacco Use   Smoking status: Never Smoker   Smokeless tobacco: Never Used  Substance and Sexual Activity   Alcohol use: No   Drug use: No   Sexual activity: Not on file  Lifestyle   Physical activity    Days per week: Not on file    Minutes per session: Not on file   Stress: Not on file  Relationships   Social connections    Talks on phone: Not on file    Gets together: Not on file    Attends religious service: Not on file    Active member of club or organization: Not on file    Attends meetings of clubs or organizations: Not on file    Relationship status: Not on file   Intimate partner violence    Fear of  current or ex partner: Not on file    Emotionally abused: Not on file    Physically abused: Not on file    Forced sexual activity: Not on file  Other Topics Concern   Not on file  Social History Narrative   Lives in [place] with husband.  More or less w/c bound since leg fx about 3 yrs ago.   Family History  Problem Relation Age of Onset   Diabetes Mother        died in her 68's of a stroke   Cancer Mother    Stroke Mother    Breast cancer Mother    Diabetes Father        died in his 22's of a stroke.   Stroke Father    Diabetes Brother        died @ 80 of a stroke.   Stroke Brother    Diabetes Maternal Grandmother    Diabetes Maternal Grandfather    Diabetes Paternal Grandmother    Diabetes Paternal Grandfather    Breast cancer Maternal Aunt       VITAL SIGNS BP 132/81    Pulse 80    Temp (!) 96.8 F (36 C) (Oral)    Resp 20    Ht 5' 4"  (1.626 m)    Wt 191 lb (86.6 kg)    BMI 32.79 kg/m   Outpatient Encounter Medications as of 06/23/2019  Medication Sig   albuterol (PROVENTIL HFA;VENTOLIN HFA) 108 (90 Base) MCG/ACT inhaler Inhale 2 puffs into the lungs every 6 (six) hours as needed for wheezing or shortness of breath.   Amino Acids-Protein Hydrolys (FEEDING SUPPLEMENT, PRO-STAT SUGAR FREE 64,) LIQD Take 30 mLs by mouth 3 (three) times daily.   bisacodyl (DULCOLAX) 10 MG suppository Place 10 mg rectally as needed for moderate constipation.   calcium carbonate (TUMS EX) 750 MG chewable tablet Chew 1 tablet by mouth 2 (two) times daily between meals.   Dextrose-Sodium Chloride (DEXTROSE 5 % AND 0.9% NACL) 5-0.9 % infusion D5 % and 0.9 % sodium chloride parenteral solution; - ; amt: 40cc/hr; intravenous  Special Instructions: 40cc/hr KVO for renal failure Every Shift   diltiazem (DILACOR XR) 180 MG 24 hr capsule Take 180 mg by mouth daily.   enoxaparin (LOVENOX) 80 MG/0.8ML injection Inject 80 mg into the skin every 12 (twelve) hours.    ergocalciferol (VITAMIN D2) 1.25 MG (50000 UT) capsule Take 50,000 Units by mouth once a week. Wednesdays   feeding supplement, ENSURE ENLIVE, (ENSURE ENLIVE) LIQD Take 237 mLs by mouth 3 (three) times daily between meals.   ferrous sulfate (FERROUSUL) 325 (65 FE) MG tablet Take 1  tablet (325 mg total) by mouth 2 (two) times daily with a meal.   fluticasone (FLONASE) 50 MCG/ACT nasal spray Place 2 sprays into both nostrils daily as needed for allergies or rhinitis (congestion).    folic acid (FOLVITE) 1 MG tablet Take 1 tablet (1 mg total) by mouth daily.   glucagon (GLUCAGEN HYPOKIT) 1 MG SOLR injection Inject 1 mg into the vein as needed for low blood sugar.   insulin aspart (NOVOLOG FLEXPEN) 100 UNIT/ML FlexPen Inject 3 Units into the skin 3 (three) times daily with meals.    levalbuterol (XOPENEX) 0.63 MG/3ML nebulizer solution Take 0.63 mg by nebulization every 6 (six) hours as needed for wheezing or shortness of breath.    metoprolol tartrate (LOPRESSOR) 50 MG tablet Take 50 mg by mouth 2 (two) times daily.    Multiple Vitamin (MULTIVITAMIN WITH MINERALS) TABS tablet Take 1 tablet by mouth daily.   NON FORMULARY Heart Healthy Low Sodium Diet   NON FORMULARY Daily Weight Special Instructions: Notify MD for 3 lb weight gain in 1-2 days or 2 lbs overnight Once A Day 07:15 AM - 03:15 PM   ondansetron (ZOFRAN) 4 MG tablet Take 4 mg by mouth 3 (three) times daily before meals.    pantoprazole (PROTONIX) 40 MG tablet Take 40 mg by mouth 2 (two) times daily.   polyethylene glycol (MIRALAX / GLYCOLAX) 17 g packet Take 17 g by mouth daily as needed for mild constipation.   silver nitrate applicators 93-26 % applicator Apply topically as directed to abdominal wound as needed for excessive bleeding. As Needed   SPIRIVA RESPIMAT 1.25 MCG/ACT AERS Inhale 1 puff into the lungs daily.    [DISCONTINUED] apixaban (ELIQUIS) 5 MG TABS tablet Take 1 tablet (5 mg total) by mouth 2 (two) times  daily.   [DISCONTINUED] diltiazem (CARDIZEM CD) 120 MG 24 hr capsule Take 1 capsule (120 mg total) by mouth daily.   [DISCONTINUED] Insulin Glargine (LANTUS SOLOSTAR) 100 UNIT/ML Solostar Pen Inject 6 Units into the skin at bedtime.   [DISCONTINUED] pantoprazole (PROTONIX) 40 MG tablet Take 1 tablet (40 mg total) by mouth daily before breakfast. (Patient taking differently: Take 40 mg by mouth 2 (two) times daily. )   No facility-administered encounter medications on file as of 06/23/2019.      SIGNIFICANT DIAGNOSTIC EXAMS  PREVIOUS;   05-09-19: chest x-ray: No acute disease   05-17-19: EEG: This study is suggestive of moderate diffuse encephalopathy, non specific to etiology.  No seizures or epileptiform discharges were seen throughout the recording.   05-17-19: ct of head: Examination is significantly limited by motion artifact throughout. Within this limitation, no acute intracranial pathology. Small-vessel white matter disease.  06-08-19: ct of abdomen and pelvis:  1. Resolved fluid collection in the subcutaneous tissues of the lower abdominal wall. Residual skin irregularity but no soft tissue thickening or inflammatory change. No recurrent or new abscess. 2. Clustered nodular ground-glass opacities in the right lower lobe with associated bronchial thickening, suspicious for aspiration. Or pneumonia also considered. 3. Mild bladder wall thickening inferiorly, can be seen with urinary tract infection. Recommend correlation with urinalysis. 4. Colonic diverticulosis without diverticulitis. Aortic Atherosclerosis   06-08-19: chest x-ray; 1. No active cardiopulmonary disease. 2. Aortic atherosclerosis.  06-10-19: 2-d echo:   1. Left ventricular ejection fraction, by visual estimation, is 65 to 70%. The left ventricle has normal function. Normal left ventricular size. Left ventricular septal wall thickness was mildly increased. Mildly increased left ventricular posterior  wall  thickness.  2. Global right ventricle has normal systolic function.The right ventricular size is normal. No increase in right ventricular wall thickness.  3. Left atrial size was normal.  4. Right atrial size was normal.  5. The mitral valve is degenerative. Moderate mitral annular calcification. There is mild thickening of the mitral valve leaflet(s). There is mild calcification of the mitral valve leaflet(s). No evidence of mitral valve regurgitation. Mild mitral  stenosis.  6. The tricuspid valve is normal in structure. Tricuspid valve regurgitation is mild.  7. The aortic valve is normal in structure. Aortic valve regurgitation was not visualized by color flow Doppler. Structurally normal aortic valve, with no evidence of sclerosis or stenosis.  8. The pulmonic valve was normal in structure. Pulmonic valve regurgitation is trivial by color flow Doppler.  9. The inferior vena cava is normal in size with greater than 50% respiratory variability, suggesting right atrial pressure of 3 mmHg.  06-10-19: ct of head: 1. No acute or interval finding. ASPECTS is 10.   2. Atherosclerotic calcification.   06-11-19: MRI of brain" Negative for acute infarct  Atrophy and moderate chronic microvascular ischemia.   06-14-19: chest x-ray: Mild bibasilar atelectasis.   NO NEW EXAMS.    LABS REVIEWED PREVIOUS:   05-09-19:wbc 7.7; hgb 8.8; hct 31.4; mcv 89.5 ;plt 343; glucose 193; bun 9; creat 0.63; k+ 3.4; na++ 140; ca 7.5; liver normal albumin 2.1; BNP 461.0; hgb a1c 8.8 05-11-19: wbc 5.9; hgb 8.2; hct 29.4; mcv 89.1 plt 338; glucose 279; bun 12; creat 0.70; k+ 3.9; na++ 139; ca 7.3; mag 1.2 05-16-19: wbc 9.9; hgb 9.0; hct 30.3; mcv 85.1 pl 371; glucose 93; bun 10; creat 0.90; k+ 3.3; na++ 139; ca 8.1; mag 1.2 tsh 1.071 05-19-19: glucose 79; bun 8; creat 0.85; k+ 3.7; na++ 140; ca 7.9; mag 1.7 05-23-19: glucose 157; bun 13; creat 0.89; k+ 3.8; na++ 140; ca 7.9  06-01-19: glucose 109; bun 42; creat 1.35 ;k+  5.2; na++ 137; ca 8.3; liver normal albumin 2.0; mag 1.3 06-07-19: wbc 15.1; hgb 10.6; hct 37.4; mcv 91.4 plt 526; glucose 305; bun 65; creat 2.24; k+ 6.5; na++ 131; ca 7.9; liver normal albumin 2.0; mag 1.5 urine culture: klebsiella pneumoniae ESBL 06-08-19: wbc 29.6; hgb 9.4; hct 31.6; mcv 87.1 plt 521; glucose 158; bun 62; creat 2.20; k+ 4.0; na++ 132; ca 7.5; mag 4.6; ferritin 181; CRP 11.7; blood culture: no growth; COVID 19 + 06-09-19: fibrinogen 347; d-dimer <0.27 06-10-19: wbc 22.5; hgb 7.3; hct 24.0; mcv 86.3 ;plt 329; glucose 117; bun 39; create 1.05; k+ 3.5; na++ 138; ca 7.0; liver normal albumin 1.3; vit B 12: 1391; folate 5.1; tsh 0.456 06-11-19: hgb a1c 7.4; chol 53; ldl 26; trig 86; hdl 11 06-12-19: wbc 10.3; hgb 7.4; hct 24.;1 mcv 85.2 ;plt 275; glucose 225; bun 24; creat 0.79; k+ 4.1; na++ 140; ca 7.6; liver normal albumin 1.2; mag 1.8; LDH 274; CRP 15.8 06-13-28: wbc 10.8; hgb 9.5; hct 30.4; mcv 89.1 plt 224; glucose 71; bun 26; creat 0.65; k+ 3.6; an++ 138; ca 7.2; liver normal albumin 1.3; CRP 9.4; ferritin 311 06-16-19: wbc 6.9; hgb 9.5; hct 30.2; mcv 88.6 ;plt 229; glucose 121; bun 22; creat 0.56; k+ 3.5; an++ 136; ca 7.0; liver normal albumin 3.1 CRP 9.4  TODAY;   06-20-19: glucose 132; bun 31; creat 0.98; k+ 4.9; na++ 131; ca 7.1 06-21-19: d-dimer: 0.34    Review of Systems  Constitutional: Positive for malaise/fatigue.  Respiratory: Positive for shortness  of breath. Negative for cough.   Cardiovascular: Negative for chest pain, palpitations and leg swelling.  Gastrointestinal: Positive for heartburn and nausea. Negative for abdominal pain and constipation.  Musculoskeletal: Negative for back pain, joint pain and myalgias.  Skin: Negative.   Neurological: Positive for weakness. Negative for dizziness.  Psychiatric/Behavioral: The patient is not nervous/anxious.     Physical Exam Constitutional:      General: She is not in acute distress.    Appearance: She is  well-developed. She is obese. She is not diaphoretic.  Neck:     Musculoskeletal: Neck supple.     Thyroid: No thyromegaly.  Cardiovascular:     Rate and Rhythm: Normal rate and regular rhythm.     Pulses: Normal pulses.     Heart sounds: Normal heart sounds.  Pulmonary:     Effort: Pulmonary effort is normal. No respiratory distress.     Breath sounds: Normal breath sounds.  Abdominal:     General: Bowel sounds are normal. There is no distension.     Palpations: Abdomen is soft.     Tenderness: There is abdominal tenderness.     Comments: Has generalized tenderness to mild palpation  History of esophageal dilation    Musculoskeletal:     Right lower leg: No edema.     Left lower leg: No edema.     Comments:  Is able to move all extremities  History of left hip IM nail    Lymphadenopathy:     Cervical: No cervical adenopathy.  Skin:    General: Skin is warm and dry.     Comments: Abdominal incisional dressing intact Has bruising Has hematoma right thigh    Neurological:     Mental Status: She is alert and oriented to person, place, and time.  Psychiatric:        Mood and Affect: Mood normal.       ASSESSMENT/ PLAN:  TODAY;   1. 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 We will attempt to wean resident as appropriate.  Ok Edwards NP [facility]  Contact [phone] Monday through Friday 8am- 5pm  After hours call [phone]

## 2019-06-24 ENCOUNTER — Encounter: Payer: Self-pay | Admitting: Adult Health

## 2019-06-24 ENCOUNTER — Non-Acute Institutional Stay (SKILLED_NURSING_FACILITY): Payer: BC Managed Care – PPO | Admitting: Adult Health

## 2019-06-24 ENCOUNTER — Encounter (HOSPITAL_COMMUNITY)
Admission: RE | Admit: 2019-06-24 | Discharge: 2019-06-24 | Disposition: A | Discharge status: Home / Self Care | Payer: BC Managed Care – PPO | Source: Skilled Nursing Facility | Attending: Adult Health | Admitting: Adult Health

## 2019-06-24 DIAGNOSIS — I5033 Acute on chronic diastolic (congestive) heart failure: Secondary | ICD-10-CM

## 2019-06-24 DIAGNOSIS — J1289 Other viral pneumonia: Secondary | ICD-10-CM | POA: Diagnosis not present

## 2019-06-24 DIAGNOSIS — U071 COVID-19: Secondary | ICD-10-CM | POA: Diagnosis not present

## 2019-06-24 DIAGNOSIS — B961 Klebsiella pneumoniae [K. pneumoniae] as the cause of diseases classified elsewhere: Secondary | ICD-10-CM | POA: Insufficient documentation

## 2019-06-24 DIAGNOSIS — E162 Hypoglycemia, unspecified: Secondary | ICD-10-CM | POA: Insufficient documentation

## 2019-06-24 DIAGNOSIS — G9340 Encephalopathy, unspecified: Secondary | ICD-10-CM | POA: Insufficient documentation

## 2019-06-24 LAB — BASIC METABOLIC PANEL
Anion gap: 6 (ref 5–15)
BUN: 17 mg/dL (ref 8–23)
CO2: 20 mmol/L — ABNORMAL LOW (ref 22–32)
Calcium: 7.5 mg/dL — ABNORMAL LOW (ref 8.9–10.3)
Chloride: 111 mmol/L (ref 98–111)
Creatinine, Ser: 0.8 mg/dL (ref 0.44–1.00)
GFR calc Af Amer: >60 mL/min (ref >60)
GFR calc non Af Amer: >60 mL/min (ref >60)
Glucose, Bld: 102 mg/dL — ABNORMAL HIGH (ref 70–99)
Potassium: 4.1 mmol/L (ref 3.5–5.1)
Sodium: 137 mmol/L (ref 135–145)

## 2019-06-24 LAB — CBC
HCT: 27.3 % — ABNORMAL LOW (ref 36.0–46.0)
Hemoglobin: 8.3 g/dL — ABNORMAL LOW (ref 12.0–15.0)
MCH: 27.8 pg (ref 26.0–34.0)
MCHC: 30.4 g/dL (ref 30.0–36.0)
MCV: 91.3 fL (ref 80.0–100.0)
Platelets: 406 10*3/uL — ABNORMAL HIGH (ref 150–400)
RBC: 2.99 MIL/uL — ABNORMAL LOW (ref 3.87–5.11)
RDW: 18.4 % — ABNORMAL HIGH (ref 11.5–15.5)
WBC: 6.4 10*3/uL (ref 4.0–10.5)
nRBC: 0 % (ref 0.0–0.2)

## 2019-06-24 NOTE — Progress Notes (Signed)
Location:  [place] Room Number: 158-P Place of Service:  SNF (31)   CODE STATUS: Full Code  Allergies  Allergen Reactions  . Feraheme [Ferumoxytol] Other (See Comments)    Back pain (yelling out with back pain)  . Propranolol Swelling    Pt states it may have been leg swelling  . Topamax [Topiramate] Other (See Comments)    hallucinations  . Latex Itching and Rash    Chief Complaint  Patient presents with  . Advanced Directive    Care Plan Meeting    HPI:  We have come together for her care plan meeting. She does have family present via phone. We have had a prolonged discussion regarding her advanced directives; she desires to remain a full code. Her goal is to return back home. She is doing much better today is out of bed into a wheelchair. She denies any cough today; no nausea; is wanting to eat. There are no reports of fevers present.   Past Medical History:  Diagnosis Date  . Atrial fibrillation ([place])   . CHF (congestive heart failure) ([place])   . Depression   . Diabetes mellitus without complication ([place])   . 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 Hypertension   . Morbid obesity ([place])   . Osteoporosis   . Urinary incontinence   . UTI (lower urinary tract infection) 01/2016   Cipro for Klebsiella pneumoniae isolate  . Wheelchair bound    bedbound    Past Surgical History:  Procedure Laterality Date  . ABDOMINAL HYSTERECTOMY    . APPLICATION OF WOUND VAC  03/09/2019   Procedure: APPLICATION OF WOUND VAC;  Surgeon: Virl Cagey, MD;  Location: [facility];  Service: General;;  . BIOPSY  09/06/2018   Procedure: BIOPSY;  Surgeon: Danie Binder, MD;  Location: [facility];  Service: Endoscopy;;  gastric bx's  . CESAREAN SECTION    . CHOLECYSTECTOMY    . ESOPHAGEAL DILATION  09/06/2018   Procedure: ESOPHAGEAL DILATION;  Surgeon: Danie Binder, MD;  [facility]: [facility];  Service: Endoscopy;;  .  ESOPHAGOGASTRODUODENOSCOPY (EGD) WITH PROPOFOL N/A 09/06/2018   Procedure: ESOPHAGOGASTRODUODENOSCOPY (EGD) WITH PROPOFOL;  Surgeon: Danie Binder, MD;  [facility];  Service: Endoscopy;  Laterality: N/A;  dilatation  . FEMUR IM NAIL Left 02/20/2016  . FEMUR IM NAIL Left 02/20/2016   Procedure: INTRAMEDULLARY (IM) RETROGRADE FEMORAL NAILING;  Surgeon: Leandrew Koyanagi, MD;  Location: [facility];  Service: Orthopedics;  Laterality: Left;  . PANCREAS SURGERY  1967   1 cyst excised and one cyst drained  . TEE WITHOUT CARDIOVERSION N/A 04/05/2018   Procedure: TRANSESOPHAGEAL ECHOCARDIOGRAM (TEE);  Surgeon: Dorothy Spark, MD;  Location: [facility];  Service: Cardiovascular;  Laterality: N/A;  . [place]    . WOUND DEBRIDEMENT N/A 03/09/2019   Procedure: EXCISIONAL DEBRIDEMENT OF ABDOMINAL WOUND ULCERS;  Surgeon: Virl Cagey, MD;  Location: [facility];  Service: General;  Laterality: N/A;  . WRIST FRACTURE SURGERY      Social History   Socioeconomic History  . Marital status: Married    Spouse name: 1  . Number of children: Not on file  . Years of education: Not on file  . Highest education level: Not on file  Occupational History  . Occupation: retired  Scientific laboratory technician  . Financial resource strain: Not on file  . Food insecurity    Worry: Not on  file    Inability: Not on file  . Transportation needs    Medical: No    Non-medical: No  Tobacco Use  . Smoking status: Never Smoker  . Smokeless tobacco: Never Used  Substance and Sexual Activity  . Alcohol use: No  . Drug use: No  . Sexual activity: Not on file  Lifestyle  . Physical activity    Days per week: Not on file    Minutes per session: Not on file  . Stress: Not on file  Relationships  . Social Herbalist on phone: Not on file    Gets together: Not on file    Attends religious service: Not on file    Active member of club or organization: Not on file    Attends meetings of clubs or  organizations: Not on file    Relationship status: Not on file  . Intimate partner violence    Fear of current or ex partner: Not on file    Emotionally abused: Not on file    Physically abused: Not on file    Forced sexual activity: Not on file  Other Topics Concern  . Not on file  Social History Narrative   Lives in [place] with husband.  More or less w/c bound since leg fx about 3 yrs ago.   Family History  Problem Relation Age of Onset  . Diabetes Mother        died in her 72's of a stroke  . Cancer Mother   . Stroke Mother   . Breast cancer Mother   . Diabetes Father        died in his 49's of a stroke.  . Stroke Father   . Diabetes Brother        died @ 68 of a stroke.  . Stroke Brother   . Diabetes Maternal Grandmother   . Diabetes Maternal Grandfather   . Diabetes Paternal Grandmother   . Diabetes Paternal Grandfather   . Breast cancer Maternal Aunt       VITAL SIGNS BP 129/64   Pulse 90   Temp (!) 96.8 F (36 C) (Oral)   Resp 18   Ht 5' 4"  (1.626 m)   Wt 191 lb (86.6 kg)   BMI 32.79 kg/m   Outpatient Encounter Medications as of 06/24/2019  Medication Sig  . acetaminophen (TYLENOL) 325 MG tablet Take by mouth every 6 (six) hours as needed.  Marland Kitchen albuterol (PROVENTIL HFA;VENTOLIN HFA) 108 (90 Base) MCG/ACT inhaler Inhale 2 puffs into the lungs every 6 (six) hours as needed for wheezing or shortness of breath.  . Amino Acids-Protein Hydrolys (FEEDING SUPPLEMENT, PRO-STAT SUGAR FREE 64,) LIQD Take 30 mLs by mouth 3 (three) times daily.  . bisacodyl (DULCOLAX) 10 MG suppository Place 10 mg rectally as needed for moderate constipation.  . calcium carbonate (TUMS EX) 750 MG chewable tablet Chew 1 tablet by mouth 2 (two) times daily between meals.  . Dextrose-Sodium Chloride (DEXTROSE 5 % AND 0.9% NACL) 5-0.9 % infusion D5 % and 0.9 % sodium chloride parenteral solution; - ; amt: 40cc/hr; intravenous  Special Instructions: 40cc/hr KVO for renal failure Every  Shift  . diltiazem (DILACOR XR) 180 MG 24 hr capsule Take 180 mg by mouth daily.  Marland Kitchen enoxaparin (LOVENOX) 80 MG/0.8ML injection Inject 80 mg into the skin every 12 (twelve) hours.  . ergocalciferol (VITAMIN D2) 1.25 MG (50000 UT) capsule Take 50,000 Units by mouth once a week. Wednesdays  .  feeding supplement, ENSURE ENLIVE, (ENSURE ENLIVE) LIQD Take 237 mLs by mouth 3 (three) times daily between meals.  . ferrous sulfate (FERROUSUL) 325 (65 FE) MG tablet Take 1 tablet (325 mg total) by mouth 2 (two) times daily with a meal.  . fluticasone (FLONASE) 50 MCG/ACT nasal spray Place 2 sprays into both nostrils daily as needed for allergies or rhinitis (congestion).   . folic acid (FOLVITE) 1 MG tablet Take 1 tablet (1 mg total) by mouth daily.  Marland Kitchen glucagon (GLUCAGEN HYPOKIT) 1 MG SOLR injection Inject 1 mg into the vein as needed for low blood sugar.  . insulin aspart (NOVOLOG FLEXPEN) 100 UNIT/ML FlexPen Inject 3 Units into the skin 3 (three) times daily with meals.   Marland Kitchen levalbuterol (XOPENEX) 0.63 MG/3ML nebulizer solution Take 0.63 mg by nebulization every 6 (six) hours as needed for wheezing or shortness of breath.   . metoprolol tartrate (LOPRESSOR) 50 MG tablet Take 50 mg by mouth 2 (two) times daily.   . Multiple Vitamin (MULTIVITAMIN WITH MINERALS) TABS tablet Take 1 tablet by mouth daily.  . NON FORMULARY Heart Healthy Low Sodium Diet  . NON FORMULARY Daily Weight Special Instructions: Notify MD for 3 lb weight gain in 1-2 days or 2 lbs overnight Once A Day 07:15 AM - 03:15 PM  . ondansetron (ZOFRAN) 4 MG tablet Take 4 mg by mouth 3 (three) times daily before meals.   . pantoprazole (PROTONIX) 40 MG tablet Take 40 mg by mouth 2 (two) times daily.  . polyethylene glycol (MIRALAX / GLYCOLAX) 17 g packet Take 17 g by mouth daily as needed for mild constipation.  . silver nitrate applicators 58-09 % applicator Apply topically as directed to abdominal wound as needed for excessive bleeding. As  Needed  . SPIRIVA RESPIMAT 1.25 MCG/ACT AERS Inhale 1 puff into the lungs daily.    No facility-administered encounter medications on file as of 06/24/2019.      SIGNIFICANT DIAGNOSTIC EXAMS   PREVIOUS;   05-09-19: chest x-ray: No acute disease   05-17-19: EEG: This study is suggestive of moderate diffuse encephalopathy, non specific to etiology.  No seizures or epileptiform discharges were seen throughout the recording.   05-17-19: ct of head: Examination is significantly limited by motion artifact throughout. Within this limitation, no acute intracranial pathology. Small-vessel white matter disease.  06-08-19: ct of abdomen and pelvis:  1. Resolved fluid collection in the subcutaneous tissues of the lower abdominal wall. Residual skin irregularity but no soft tissue thickening or inflammatory change. No recurrent or new abscess. 2. Clustered nodular ground-glass opacities in the right lower lobe with associated bronchial thickening, suspicious for aspiration. Or pneumonia also considered. 3. Mild bladder wall thickening inferiorly, can be seen with urinary tract infection. Recommend correlation with urinalysis. 4. Colonic diverticulosis without diverticulitis. Aortic Atherosclerosis   06-08-19: chest x-ray; 1. No active cardiopulmonary disease. 2. Aortic atherosclerosis.  06-10-19: 2-d echo:   1. Left ventricular ejection fraction, by visual estimation, is 65 to 70%. The left ventricle has normal function. Normal left ventricular size. Left ventricular septal wall thickness was mildly increased. Mildly increased left ventricular posterior  wall thickness.  2. Global right ventricle has normal systolic function.The right ventricular size is normal. No increase in right ventricular wall thickness.  3. Left atrial size was normal.  4. Right atrial size was normal.  5. The mitral valve is degenerative. Moderate mitral annular calcification. There is mild thickening of the mitral valve  leaflet(s). There is mild calcification of  the mitral valve leaflet(s). No evidence of mitral valve regurgitation. Mild mitral  stenosis.  6. The tricuspid valve is normal in structure. Tricuspid valve regurgitation is mild.  7. The aortic valve is normal in structure. Aortic valve regurgitation was not visualized by color flow Doppler. Structurally normal aortic valve, with no evidence of sclerosis or stenosis.  8. The pulmonic valve was normal in structure. Pulmonic valve regurgitation is trivial by color flow Doppler.  9. The inferior vena cava is normal in size with greater than 50% respiratory variability, suggesting right atrial pressure of 3 mmHg.  06-10-19: ct of head: 1. No acute or interval finding. ASPECTS is 10.   2. Atherosclerotic calcification.   06-11-19: MRI of brain" Negative for acute infarct  Atrophy and moderate chronic microvascular ischemia.   06-14-19: chest x-ray: Mild bibasilar atelectasis.   TODAY;   06-23-19: chest x-ray; no acute findings.     LABS REVIEWED PREVIOUS:   05-09-19:wbc 7.7; hgb 8.8; hct 31.4; mcv 89.5 ;plt 343; glucose 193; bun 9; creat 0.63; k+ 3.4; na++ 140; ca 7.5; liver normal albumin 2.1; BNP 461.0; hgb a1c 8.8 05-11-19: wbc 5.9; hgb 8.2; hct 29.4; mcv 89.1 plt 338; glucose 279; bun 12; creat 0.70; k+ 3.9; na++ 139; ca 7.3; mag 1.2 05-16-19: wbc 9.9; hgb 9.0; hct 30.3; mcv 85.1 pl 371; glucose 93; bun 10; creat 0.90; k+ 3.3; na++ 139; ca 8.1; mag 1.2 tsh 1.071 05-19-19: glucose 79; bun 8; creat 0.85; k+ 3.7; na++ 140; ca 7.9; mag 1.7 05-23-19: glucose 157; bun 13; creat 0.89; k+ 3.8; na++ 140; ca 7.9  06-01-19: glucose 109; bun 42; creat 1.35 ;k+ 5.2; na++ 137; ca 8.3; liver normal albumin 2.0; mag 1.3 06-07-19: wbc 15.1; hgb 10.6; hct 37.4; mcv 91.4 plt 526; glucose 305; bun 65; creat 2.24; k+ 6.5; na++ 131; ca 7.9; liver normal albumin 2.0; mag 1.5 urine culture: klebsiella pneumoniae ESBL 06-08-19: wbc 29.6; hgb 9.4; hct 31.6; mcv 87.1 plt 521;  glucose 158; bun 62; creat 2.20; k+ 4.0; na++ 132; ca 7.5; mag 4.6; ferritin 181; CRP 11.7; blood culture: no growth; COVID 19 + 06-09-19: fibrinogen 347; d-dimer <0.27 06-10-19: wbc 22.5; hgb 7.3; hct 24.0; mcv 86.3 ;plt 329; glucose 117; bun 39; create 1.05; k+ 3.5; na++ 138; ca 7.0; liver normal albumin 1.3; vit B 12: 1391; folate 5.1; tsh 0.456 06-11-19: hgb a1c 7.4; chol 53; ldl 26; trig 86; hdl 11 06-12-19: wbc 10.3; hgb 7.4; hct 24.;1 mcv 85.2 ;plt 275; glucose 225; bun 24; creat 0.79; k+ 4.1; na++ 140; ca 7.6; liver normal albumin 1.2; mag 1.8; LDH 274; CRP 15.8 06-13-28: wbc 10.8; hgb 9.5; hct 30.4; mcv 89.1 plt 224; glucose 71; bun 26; creat 0.65; k+ 3.6; an++ 138; ca 7.2; liver normal albumin 1.3; CRP 9.4; ferritin 311 06-16-19: wbc 6.9; hgb 9.5; hct 30.2; mcv 88.6 ;plt 229; glucose 121; bun 22; creat 0.56; k+ 3.5; an++ 136; ca 7.0; liver normal albumin 3.1 CRP 9.4 06-20-19: glucose 132; bun 31; creat 0.98; k+ 4.9; na++ 131; ca 7.1 06-21-19: d-dimer: 0.34  NO NEW LABS.     Review of Systems  Constitutional: Negative for malaise/fatigue.  Respiratory: Negative for cough and shortness of breath.   Cardiovascular: Negative for chest pain, palpitations and leg swelling.  Gastrointestinal: Negative for abdominal pain, constipation and heartburn.  Musculoskeletal: Negative for back pain, joint pain and myalgias.  Skin: Negative.   Neurological: Negative for dizziness.  Psychiatric/Behavioral: The patient is not nervous/anxious.  Physical Exam Constitutional:      General: She is not in acute distress.    Appearance: She is well-developed. She is obese. She is not diaphoretic.  Neck:     Musculoskeletal: Neck supple.     Thyroid: No thyromegaly.  Cardiovascular:     Rate and Rhythm: Normal rate and regular rhythm.     Pulses: Normal pulses.     Heart sounds: Normal heart sounds.  Pulmonary:     Effort: Pulmonary effort is normal. No respiratory distress.     Breath sounds:  Normal breath sounds.  Abdominal:     General: Bowel sounds are normal. There is no distension.     Palpations: Abdomen is soft.     Tenderness: There is abdominal tenderness.     Comments: Has generalized tenderness to mild palpation  History of esophageal dilation     Musculoskeletal:     Right lower leg: No edema.     Left lower leg: No edema.     Comments: Is able to move all extremities  History of left hip IM nail     Lymphadenopathy:     Cervical: No cervical adenopathy.  Skin:    General: Skin is warm and dry.     Comments: Abdominal incisional dressing intact Has bruising Has hematoma right thigh     Neurological:     Mental Status: She is alert and oriented to person, place, and time.  Psychiatric:        Mood and Affect: Mood normal.      ASSESSMENT/ PLAN:  TODAY;   1. Acute on chronic diastolic congestive heart failure 2. covid 19 virus infection 3. Pneumonia due to covdi 19 virus  Will continue with full code status Her goal is to return back [place] Will give her double portions to help with her protein intake Will continue therapy as directed If she continues to do well; she may be able to go home in the next 2-3 weeks.  Will monitor her status.     MD is aware of resident's narcotic use and is in agreement with current plan of care. We will attempt to wean resident as appropriate.  Ok Edwards NP [facility]  Contact [phone] Monday through Friday 8am- 5pm  After hours call [phone]

## 2019-06-25 ENCOUNTER — Encounter: Payer: Self-pay | Admitting: Internal Medicine

## 2019-06-25 DIAGNOSIS — B9689 Other specified bacterial agents as the cause of diseases classified elsewhere: Secondary | ICD-10-CM | POA: Insufficient documentation

## 2019-06-25 DIAGNOSIS — D649 Anemia, unspecified: Secondary | ICD-10-CM | POA: Insufficient documentation

## 2019-06-25 DIAGNOSIS — N390 Urinary tract infection, site not specified: Secondary | ICD-10-CM | POA: Insufficient documentation

## 2019-06-27 ENCOUNTER — Encounter: Payer: Self-pay | Admitting: Adult Health

## 2019-06-27 ENCOUNTER — Non-Acute Institutional Stay (SKILLED_NURSING_FACILITY): Payer: BC Managed Care – PPO | Admitting: Adult Health

## 2019-06-27 DIAGNOSIS — E1159 Type 2 diabetes mellitus with other circulatory complications: Secondary | ICD-10-CM | POA: Diagnosis not present

## 2019-06-27 DIAGNOSIS — I152 Hypertension secondary to endocrine disorders: Secondary | ICD-10-CM

## 2019-06-27 DIAGNOSIS — I5033 Acute on chronic diastolic (congestive) heart failure: Secondary | ICD-10-CM

## 2019-06-27 DIAGNOSIS — I4891 Unspecified atrial fibrillation: Secondary | ICD-10-CM | POA: Diagnosis not present

## 2019-06-27 DIAGNOSIS — I10 Essential (primary) hypertension: Secondary | ICD-10-CM

## 2019-06-27 NOTE — Progress Notes (Signed)
Location:    [facility] Room Number: 158/P Place of Service:  SNF (31)   CODE STATUS: Full Code  Allergies  Allergen Reactions  . Feraheme [Ferumoxytol] Other (See Comments)    Back pain (yelling out with back pain)  . Propranolol Swelling    Pt states it may have been leg swelling  . Topamax [Topiramate] Other (See Comments)    hallucinations  . Latex Itching and Rash   Chief Complaint  Patient presents with  . Medical Management of Chronic Issues       Atrial fibrillation with tachycardic ventricular rate:   Acute on chronic diastolic CHF (congestive heart failure)   Hypertension associated with type 2 diabetes mellitus   Weekly follow up for the first 30 days post hospitalization.      HPI:  She is a 68 year old short term rehab patient seen for the management of her chronic illnesses:afib; chf; hypertension.  She does have some right upper extremity swelling present from her midline. She denies any pain present. Is awaiting a doppler. She denies any cough shortness of breath; no nausea. She is getting her appetite back.   Past Medical History:  Diagnosis Date  . Atrial fibrillation ([place])   . CHF (congestive heart failure) ([place])   . Depression   . Diabetes mellitus without complication ([place])   . 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 Hypertension   . Morbid obesity ([place])   . Osteoporosis   . Urinary incontinence   . UTI (lower urinary tract infection) 01/2016   Cipro for Klebsiella pneumoniae isolate  . Wheelchair bound    bedbound    Past Surgical History:  Procedure Laterality Date  . ABDOMINAL HYSTERECTOMY    . APPLICATION OF WOUND VAC  03/09/2019   Procedure: APPLICATION OF WOUND VAC;  Surgeon: Virl Cagey, MD;  Location: [facility];  Service: General;;  . BIOPSY  09/06/2018   Procedure: BIOPSY;  Surgeon: Danie Binder, MD;  Location: [facility];  Service: Endoscopy;;  gastric bx's  . CESAREAN  SECTION    . CHOLECYSTECTOMY    . ESOPHAGEAL DILATION  09/06/2018   Procedure: ESOPHAGEAL DILATION;  Surgeon: Danie Binder, MD;  Location: [facility];  Service: Endoscopy;;  . ESOPHAGOGASTRODUODENOSCOPY (EGD) WITH PROPOFOL N/A 09/06/2018   Procedure: ESOPHAGOGASTRODUODENOSCOPY (EGD) WITH PROPOFOL;  Surgeon: Danie Binder, MD;  [facility];  Service: Endoscopy;  Laterality: N/A;  dilatation  . FEMUR IM NAIL Left 02/20/2016  . FEMUR IM NAIL Left 02/20/2016   Procedure: INTRAMEDULLARY (IM) RETROGRADE FEMORAL NAILING;  Surgeon: Leandrew Koyanagi, MD;  Location: [facility];  Service: Orthopedics;  Laterality: Left;  . PANCREAS SURGERY  1967   1 cyst excised and one cyst drained  . TEE WITHOUT CARDIOVERSION N/A 04/05/2018   Procedure: TRANSESOPHAGEAL ECHOCARDIOGRAM (TEE);  Surgeon: Dorothy Spark, MD;  Location: [facility];  Service: Cardiovascular;  Laterality: N/A;  . [place]    . WOUND DEBRIDEMENT N/A 03/09/2019   Procedure: EXCISIONAL DEBRIDEMENT OF ABDOMINAL WOUND ULCERS;  Surgeon: Virl Cagey, MD;  Location: [facility];  Service: General;  Laterality: N/A;  . WRIST FRACTURE SURGERY      Social History   Socioeconomic History  . Marital status: Married    Spouse name: 1  . Number of children: Not on file  . Years of education: Not on file  . Highest education level: Not on  file  Occupational History  . Occupation: retired  Scientific laboratory technician  . Financial resource strain: Not on file  . Food insecurity    Worry: Not on file    Inability: Not on file  . Transportation needs    Medical: No    Non-medical: No  Tobacco Use  . Smoking status: Never Smoker  . Smokeless tobacco: Never Used  Substance and Sexual Activity  . Alcohol use: No  . Drug use: No  . Sexual activity: Not on file  Lifestyle  . Physical activity    Days per week: Not on file    Minutes per session: Not on file  . Stress: Not on file  Relationships  . Social Product manager on phone: Not on file    Gets together: Not on file    Attends religious service: Not on file    Active member of club or organization: Not on file    Attends meetings of clubs or organizations: Not on file    Relationship status: Not on file  . Intimate partner violence    Fear of current or ex partner: Not on file    Emotionally abused: Not on file    Physically abused: Not on file    Forced sexual activity: Not on file  Other Topics Concern  . Not on file  Social History Narrative   Lives in [place] with husband.  More or less w/c bound since leg fx about 3 yrs ago.   Family History  Problem Relation Age of Onset  . Diabetes Mother        died in her 44's of a stroke  . Cancer Mother   . Stroke Mother   . Breast cancer Mother   . Diabetes Father        died in his 24's of a stroke.  . Stroke Father   . Diabetes Brother        died @ 49 of a stroke.  . Stroke Brother   . Diabetes Maternal Grandmother   . Diabetes Maternal Grandfather   . Diabetes Paternal Grandmother   . Diabetes Paternal Grandfather   . Breast cancer Maternal Aunt       VITAL SIGNS BP (!) 132/58   Pulse 92   Temp 98.1 F (36.7 C) (Oral)   Resp 20   Ht 5' 4"  (1.626 m)   Wt 191 lb (86.6 kg)   BMI 32.79 kg/m   Outpatient Encounter Medications as of 06/27/2019  Medication Sig  . acetaminophen (TYLENOL) 325 MG tablet Take by mouth every 6 (six) hours as needed.  Marland Kitchen albuterol (PROVENTIL HFA;VENTOLIN HFA) 108 (90 Base) MCG/ACT inhaler Inhale 2 puffs into the lungs every 6 (six) hours as needed for wheezing or shortness of breath.  . Amino Acids-Protein Hydrolys (FEEDING SUPPLEMENT, PRO-STAT SUGAR FREE 64,) LIQD Take 30 mLs by mouth 3 (three) times daily.  . bisacodyl (DULCOLAX) 10 MG suppository Place 10 mg rectally as needed for moderate constipation.  . calcium carbonate (TUMS EX) 750 MG chewable tablet Chew 1 tablet by mouth 3 (three) times daily.   Marland Kitchen diltiazem (DILACOR XR) 180 MG 24 hr  capsule Take 180 mg by mouth daily.  Marland Kitchen enoxaparin (LOVENOX) 80 MG/0.8ML injection Inject 80 mg into the skin every 12 (twelve) hours.  . ergocalciferol (VITAMIN D2) 1.25 MG (50000 UT) capsule Take 50,000 Units by mouth once a week. Wednesdays  . feeding supplement, ENSURE ENLIVE, (ENSURE ENLIVE) LIQD Take 237  mLs by mouth 3 (three) times daily between meals.  . ferrous sulfate (FERROUSUL) 325 (65 FE) MG tablet Take 1 tablet (325 mg total) by mouth  daily with a meal.  . fluticasone (FLONASE) 50 MCG/ACT nasal spray Place 2 sprays into both nostrils daily as needed for allergies or rhinitis (congestion).   . folic acid (FOLVITE) 1 MG tablet Take 1 tablet (1 mg total) by mouth daily.  Marland Kitchen glucagon (GLUCAGEN HYPOKIT) 1 MG SOLR injection Inject 1 mg into the vein as needed for low blood sugar.  . insulin aspart (NOVOLOG FLEXPEN) 100 UNIT/ML FlexPen Inject 3 Units into the skin 3 (three) times daily with meals.   Marland Kitchen levalbuterol (XOPENEX) 0.63 MG/3ML nebulizer solution Take 0.63 mg by nebulization every 6 (six) hours as needed for wheezing or shortness of breath.   . metoprolol tartrate (LOPRESSOR) 50 MG tablet Take 50 mg by mouth 2 (two) times daily.   . Multiple Vitamin (MULTIVITAMIN WITH MINERALS) TABS tablet Take 1 tablet by mouth daily.  . NON FORMULARY Heart Healthy Low Sodium Diet  . NON FORMULARY Daily Weight Special Instructions: Notify MD for 3 lb weight gain in 1-2 days or 2 lbs overnight Once A Day 07:15 AM - 03:15 PM  . NON FORMULARY Magic Cup TID with meals  . ondansetron (ZOFRAN) 4 MG tablet Take 4 mg by mouth 3 (three) times daily before meals.   . pantoprazole (PROTONIX) 40 MG tablet Take 40 mg by mouth 2 (two) times daily.  . polyethylene glycol (MIRALAX / GLYCOLAX) 17 g packet Take 17 g by mouth daily as needed for mild constipation.  . silver nitrate applicators 82-95 % applicator Apply topically as directed to abdominal wound as needed for excessive bleeding. As Needed  . SPIRIVA  RESPIMAT 1.25 MCG/ACT AERS Inhale 1 puff into the lungs daily.   . [DISCONTINUED] Dextrose-Sodium Chloride (DEXTROSE 5 % AND 0.9% NACL) 5-0.9 % infusion D5 % and 0.9 % sodium chloride parenteral solution; - ; amt: 40cc/hr; intravenous  Special Instructions: 40cc/hr KVO for renal failure Every Shift   No facility-administered encounter medications on file as of 06/27/2019.      SIGNIFICANT DIAGNOSTIC EXAMS  PREVIOUS;   05-09-19: chest x-ray: No acute disease   05-17-19: EEG: This study is suggestive of moderate diffuse encephalopathy, non specific to etiology.  No seizures or epileptiform discharges were seen throughout the recording.   05-17-19: ct of head: Examination is significantly limited by motion artifact throughout. Within this limitation, no acute intracranial pathology. Small-vessel white matter disease.  06-08-19: ct of abdomen and pelvis:  1. Resolved fluid collection in the subcutaneous tissues of the lower abdominal wall. Residual skin irregularity but no soft tissue thickening or inflammatory change. No recurrent or new abscess. 2. Clustered nodular ground-glass opacities in the right lower lobe with associated bronchial thickening, suspicious for aspiration. Or pneumonia also considered. 3. Mild bladder wall thickening inferiorly, can be seen with urinary tract infection. Recommend correlation with urinalysis. 4. Colonic diverticulosis without diverticulitis. Aortic Atherosclerosis   06-08-19: chest x-ray; 1. No active cardiopulmonary disease. 2. Aortic atherosclerosis.  06-10-19: 2-d echo:   1. Left ventricular ejection fraction, by visual estimation, is 65 to 70%. The left ventricle has normal function. Normal left ventricular size. Left ventricular septal wall thickness was mildly increased. Mildly increased left ventricular posterior  wall thickness.  2. Global right ventricle has normal systolic function.The right ventricular size is normal. No increase in right  ventricular wall thickness.  3. Left atrial  size was normal.  4. Right atrial size was normal.  5. The mitral valve is degenerative. Moderate mitral annular calcification. There is mild thickening of the mitral valve leaflet(s). There is mild calcification of the mitral valve leaflet(s). No evidence of mitral valve regurgitation. Mild mitral  stenosis.  6. The tricuspid valve is normal in structure. Tricuspid valve regurgitation is mild.  7. The aortic valve is normal in structure. Aortic valve regurgitation was not visualized by color flow Doppler. Structurally normal aortic valve, with no evidence of sclerosis or stenosis.  8. The pulmonic valve was normal in structure. Pulmonic valve regurgitation is trivial by color flow Doppler.  9. The inferior vena cava is normal in size with greater than 50% respiratory variability, suggesting right atrial pressure of 3 mmHg.  06-10-19: ct of head: 1. No acute or interval finding. ASPECTS is 10.   2. Atherosclerotic calcification.   06-11-19: MRI of brain" Negative for acute infarct  Atrophy and moderate chronic microvascular ischemia.   06-14-19: chest x-ray: Mild bibasilar atelectasis.   TODAY;   06-23-19: chest x-ray; no acute findings.     LABS REVIEWED PREVIOUS:   05-09-19:wbc 7.7; hgb 8.8; hct 31.4; mcv 89.5 ;plt 343; glucose 193; bun 9; creat 0.63; k+ 3.4; na++ 140; ca 7.5; liver normal albumin 2.1; BNP 461.0; hgb a1c 8.8 05-11-19: wbc 5.9; hgb 8.2; hct 29.4; mcv 89.1 plt 338; glucose 279; bun 12; creat 0.70; k+ 3.9; na++ 139; ca 7.3; mag 1.2 05-16-19: wbc 9.9; hgb 9.0; hct 30.3; mcv 85.1 pl 371; glucose 93; bun 10; creat 0.90; k+ 3.3; na++ 139; ca 8.1; mag 1.2 tsh 1.071 05-19-19: glucose 79; bun 8; creat 0.85; k+ 3.7; na++ 140; ca 7.9; mag 1.7 05-23-19: glucose 157; bun 13; creat 0.89; k+ 3.8; na++ 140; ca 7.9  06-01-19: glucose 109; bun 42; creat 1.35 ;k+ 5.2; na++ 137; ca 8.3; liver normal albumin 2.0; mag 1.3 06-07-19: wbc 15.1; hgb 10.6; hct  37.4; mcv 91.4 plt 526; glucose 305; bun 65; creat 2.24; k+ 6.5; na++ 131; ca 7.9; liver normal albumin 2.0; mag 1.5 urine culture: klebsiella pneumoniae ESBL 06-08-19: wbc 29.6; hgb 9.4; hct 31.6; mcv 87.1 plt 521; glucose 158; bun 62; creat 2.20; k+ 4.0; na++ 132; ca 7.5; mag 4.6; ferritin 181; CRP 11.7; blood culture: no growth; COVID 19 + 06-09-19: fibrinogen 347; d-dimer <0.27 06-10-19: wbc 22.5; hgb 7.3; hct 24.0; mcv 86.3 ;plt 329; glucose 117; bun 39; create 1.05; k+ 3.5; na++ 138; ca 7.0; liver normal albumin 1.3; vit B 12: 1391; folate 5.1; tsh 0.456 06-11-19: hgb a1c 7.4; chol 53; ldl 26; trig 86; hdl 11 06-12-19: wbc 10.3; hgb 7.4; hct 24.;1 mcv 85.2 ;plt 275; glucose 225; bun 24; creat 0.79; k+ 4.1; na++ 140; ca 7.6; liver normal albumin 1.2; mag 1.8; LDH 274; CRP 15.8 06-13-28: wbc 10.8; hgb 9.5; hct 30.4; mcv 89.1 plt 224; glucose 71; bun 26; creat 0.65; k+ 3.6; an++ 138; ca 7.2; liver normal albumin 1.3; CRP 9.4; ferritin 311 06-16-19: wbc 6.9; hgb 9.5; hct 30.2; mcv 88.6 ;plt 229; glucose 121; bun 22; creat 0.56; k+ 3.5; an++ 136; ca 7.0; liver normal albumin 3.1 CRP 9.4 06-20-19: glucose 132; bun 31; creat 0.98; k+ 4.9; na++ 131; ca 7.1 06-21-19: d-dimer: 0.34  NO NEW LABS.     Review of Systems  Constitutional: Negative for malaise/fatigue.  Respiratory: Negative for cough and shortness of breath.   Cardiovascular: Negative for chest pain, palpitations and leg swelling.  Gastrointestinal: Negative  for abdominal pain, constipation and heartburn.  Musculoskeletal: Negative for back pain, joint pain and myalgias.  Skin: Negative.   Neurological: Negative for dizziness.  Psychiatric/Behavioral: The patient is not nervous/anxious.     Physical Exam Constitutional:      General: She is not in acute distress.    Appearance: She is well-developed. She is obese. She is not diaphoretic.  Neck:     Musculoskeletal: Neck supple.     Thyroid: No thyromegaly.  Cardiovascular:      Rate and Rhythm: Normal rate and regular rhythm.     Pulses: Normal pulses.     Heart sounds: Normal heart sounds.  Pulmonary:     Effort: Pulmonary effort is normal. No respiratory distress.     Breath sounds: Normal breath sounds.  Abdominal:     General: Bowel sounds are normal. There is no distension.     Palpations: Abdomen is soft.     Tenderness: There is no abdominal tenderness.     Comments: Has generalized tenderness to mild palpation  History of esophageal dilation      Musculoskeletal:     Right lower leg: No edema.     Left lower leg: No edema.     Comments:  Is able to move all extremities  History of left hip IM nail     Has swelling right arm midline has been removed   Lymphadenopathy:     Cervical: No cervical adenopathy.  Skin:    General: Skin is warm and dry.     Comments: Abdominal incisional dressing intact Has bruising Has hematoma right thigh    Neurological:     Mental Status: She is alert and oriented to person, place, and time.  Psychiatric:        Mood and Affect: Mood normal.     ASSESSMENT/ PLAN:  TODAY;   1. Atrial fibrillation with tachycardic ventricular rate: is stable will continue lovenox 80 mg twice daily; cardizem cd 180 g daily and lopressor 50 mg twice daily for rate control  2. Acute on chronic diastolic CHF (congestive heart failure) is stable EF 65-70% (06-10-19) will continue lopressor 50 mg twice daily is currently not on diuretic.    3. Hypertension associated with type 2 diabetes mellitus: is stable b/p 132/58 will continue lopressor 50 mg twice daily   PREVIOUS   4. Chronic obstructive pulmonary disease unspecified copd type: is stable will continue spiriva respimet 1.25 mcg 1 puff daily; albuterol 2 puffs every 6 hours as needed has xopenex neb every 6 hours as needed  5. GERD without esophagitis is stable : will continue protonix to 40 mg twice daily will continue zofran 4 mg prior to meals for her nausea   6.  Uncontrolled type 2 diabetes mellitus with chronic kidney disease with  long term current of insulin: hgb a1c 7.4 will continue novolog   3 units with meals for cbg >150 and more than 50 % of meals. The lantus has been stopped   7. CKD stage 3 due to type 2 diabetes mellitus: is stable bun 31 creat 0.98 will monitor   8. 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  Anemia due to stage 3a chronic kidney disease: is stable hgb 9.5 is status post PRBC transfusion; will continue iron daily   10. Protein calorie malnutrition severe: albumin 1.3 will continue prostat 30 cc three times daily and ensure 3 times daily  11. Hypocalcemia: is without change: ca 7.1 will continue tums 750 mg three times daily will monitor  12. Vit D deficiency is stable will continue vitamin D 50,000 units weekly will monitor   13. Hypercoagulable state due to covid 19: is stable will continue lovenox 80 mg twice daliy    MD is aware of resident's narcotic use and is in agreement with current plan of care. We will attempt to wean resident as appropriate.  Ok Edwards NP [facility]  Contact [phone] Monday through Friday 8am- 5pm  After hours call [phone]

## 2019-06-29 ENCOUNTER — Encounter: Payer: Self-pay | Admitting: Adult Health

## 2019-06-29 ENCOUNTER — Non-Acute Institutional Stay (SKILLED_NURSING_FACILITY): Payer: BC Managed Care – PPO | Admitting: Adult Health

## 2019-06-29 DIAGNOSIS — I4891 Unspecified atrial fibrillation: Secondary | ICD-10-CM | POA: Diagnosis not present

## 2019-06-29 DIAGNOSIS — U071 COVID-19: Secondary | ICD-10-CM

## 2019-06-29 DIAGNOSIS — R1114 Bilious vomiting: Secondary | ICD-10-CM | POA: Diagnosis not present

## 2019-06-29 DIAGNOSIS — K219 Gastro-esophageal reflux disease without esophagitis: Secondary | ICD-10-CM | POA: Diagnosis not present

## 2019-06-29 NOTE — Progress Notes (Signed)
Location:  [facility] Room Number: 158-P Place of Service:  SNF (31)   CODE STATUS: FULL CODE  Allergies  Allergen Reactions   Feraheme [Ferumoxytol] Other (See Comments)    Back pain (yelling out with back pain)   Propranolol Swelling    Pt states it may have been leg swelling   Topamax [Topiramate] Other (See Comments)    hallucinations   Latex Itching and Rash    Chief Complaint  Patient presents with   Acute Visit    care plan meeting      HPI:  We have come together for her care plan meeting. BIMS 13/15 mood 6/30. She continues to participate in therapy. Her right upper extremity does have edema; is negative for dvt. No falls; her appetite is improving. Her cbg readings are good. She does not have any nausea or vomiting. Her goal continues to return home. She continues to be followed for her chronic illnesses including gerd afib; nausea vomiting   Past Medical History:  Diagnosis Date   Atrial fibrillation (HCC)    CHF (congestive heart failure) (HCC)    Depression    Diabetes mellitus without complication ([place])    History of transesophageal echocardiography (TEE) 03/2018   LV thrombus   Hypertension    Morbid obesity ([place])    Osteoporosis    Urinary incontinence    UTI (lower urinary tract infection) 01/2016   Cipro for Klebsiella pneumoniae isolate   Wheelchair bound    bedbound    Past Surgical History:  Procedure Laterality Date   ABDOMINAL HYSTERECTOMY     APPLICATION OF WOUND VAC  03/09/2019   Procedure: APPLICATION OF WOUND VAC;  Surgeon: Virl Cagey, MD;  Location: [facility];  Service: General;;   BIOPSY  09/06/2018   Procedure: BIOPSY;  Surgeon: Danie Binder, MD;  Location: [facility];  Service: Endoscopy;;  gastric bx's   CESAREAN SECTION     CHOLECYSTECTOMY     ESOPHAGEAL DILATION  09/06/2018   Procedure: ESOPHAGEAL DILATION;  Surgeon: Danie Binder, MD;  Location: [facility];  Service:  Endoscopy;;   ESOPHAGOGASTRODUODENOSCOPY (EGD) WITH PROPOFOL N/A 09/06/2018   Procedure: ESOPHAGOGASTRODUODENOSCOPY (EGD) WITH PROPOFOL;  Surgeon: Danie Binder, MD;  Location: [facility];  Service: Endoscopy;  Laterality: N/A;  dilatation   FEMUR IM NAIL Left 02/20/2016   FEMUR IM NAIL Left 02/20/2016   Procedure: INTRAMEDULLARY (IM) RETROGRADE FEMORAL NAILING;  Surgeon: Leandrew Koyanagi, MD;  Location: [facility];  Service: Orthopedics;  Laterality: Left;   PANCREAS SURGERY  1967   1 cyst excised and one cyst drained   TEE WITHOUT CARDIOVERSION N/A 04/05/2018   Procedure: TRANSESOPHAGEAL ECHOCARDIOGRAM (TEE);  Surgeon: Dorothy Spark, MD;  Location: [facility];  Service: Cardiovascular;  Laterality: N/A;   VEIN LIGATION AND STRIPPING     WOUND DEBRIDEMENT N/A 03/09/2019   Procedure: EXCISIONAL DEBRIDEMENT OF ABDOMINAL WOUND ULCERS;  Surgeon: Virl Cagey, MD;  Location: [facility];  Service: General;  Laterality: N/A;   WRIST FRACTURE SURGERY      Social History   Socioeconomic History   Marital status: Married    Spouse name: 1   Number of children: Not on file   Years of education: Not on file   Highest education level: Not on file  Occupational History   Occupation: retired  Scientist, product/process development strain: Not on file   Food insecurity    Worry: Not on  file    Inability: Not on file   Transportation needs    Medical: No    Non-medical: No  Tobacco Use   Smoking status: Never Smoker   Smokeless tobacco: Never Used  Substance and Sexual Activity   Alcohol use: No   Drug use: No   Sexual activity: Not on file  Lifestyle   Physical activity    Days per week: Not on file    Minutes per session: Not on file   Stress: Not on file  Relationships   Social connections    Talks on phone: Not on file    Gets together: Not on file    Attends religious service: Not on file    Active member of club or organization: Not on file    Attends  meetings of clubs or organizations: Not on file    Relationship status: Not on file   Intimate partner violence    Fear of current or ex partner: Not on file    Emotionally abused: Not on file    Physically abused: Not on file    Forced sexual activity: Not on file  Other Topics Concern   Not on file  Social History Narrative   Lives in [place] with husband.  More or less w/c bound since leg fx about 3 yrs ago.   Family History  Problem Relation Age of Onset   Diabetes Mother        died in her 39's of a stroke   Cancer Mother    Stroke Mother    Breast cancer Mother    Diabetes Father        died in his 22's of a stroke.   Stroke Father    Diabetes Brother        died @ 2 of a stroke.   Stroke Brother    Diabetes Maternal Grandmother    Diabetes Maternal Grandfather    Diabetes Paternal Grandmother    Diabetes Paternal Grandfather    Breast cancer Maternal Aunt       VITAL SIGNS BP 120/69    Pulse 88    Temp (!) 97.1 F (36.2 C) (Oral)    Resp 20    Ht 5' 4"  (1.626 m)    Wt 193 lb (87.5 kg)    BMI 33.13 kg/m   Outpatient Encounter Medications as of 06/29/2019  Medication Sig   acetaminophen (TYLENOL) 325 MG tablet Take 650 mg by mouth every 6 (six) hours as needed.    albuterol (PROVENTIL HFA;VENTOLIN HFA) 108 (90 Base) MCG/ACT inhaler Inhale 2 puffs into the lungs every 6 (six) hours as needed for wheezing or shortness of breath.   Amino Acids-Protein Hydrolys (FEEDING SUPPLEMENT, PRO-STAT SUGAR FREE 64,) LIQD Take 30 mLs by mouth 3 (three) times daily.   bisacodyl (DULCOLAX) 10 MG suppository Place 10 mg rectally as needed for moderate constipation.   calcium carbonate (TUMS EX) 750 MG chewable tablet Chew 1 tablet by mouth 3 (three) times daily.    collagenase (SANTYL) ointment Apply 1 application topically See admin instructions. Cleanse abdominal abrasion with NS, apply Santyl and cover with Allevyn foam dressing daily and PRN until  resolved.  Cleanse left lateral thigh with NS and apply Santyl with Allevyn daily and PRN for abrasion until resolved.   diltiazem (DILACOR XR) 180 MG 24 hr capsule Take 180 mg by mouth daily.   enoxaparin (LOVENOX) 80 MG/0.8ML injection Inject 80 mg into the skin every 12 (twelve) hours.  ergocalciferol (VITAMIN D2) 1.25 MG (50000 UT) capsule Take 50,000 Units by mouth once a week. Wednesdays   feeding supplement, ENSURE ENLIVE, (ENSURE ENLIVE) LIQD Take 237 mLs by mouth 3 (three) times daily between meals.   ferrous sulfate 325 (65 FE) MG tablet Take 325 mg by mouth daily with breakfast.   fluticasone (FLONASE) 50 MCG/ACT nasal spray Place 2 sprays into both nostrils daily as needed for allergies or rhinitis (congestion).    folic acid (FOLVITE) 1 MG tablet Take 1 tablet (1 mg total) by mouth daily.   glucagon (GLUCAGEN HYPOKIT) 1 MG SOLR injection Inject 1 mg into the vein as needed for low blood sugar.   insulin aspart (NOVOLOG FLEXPEN) 100 UNIT/ML FlexPen Inject 3 Units into the skin 3 (three) times daily with meals.    levalbuterol (XOPENEX) 0.63 MG/3ML nebulizer solution Take 0.63 mg by nebulization every 6 (six) hours as needed for wheezing or shortness of breath.    metoprolol tartrate (LOPRESSOR) 50 MG tablet Take 50 mg by mouth 2 (two) times daily.    Multiple Vitamin (MULTIVITAMIN WITH MINERALS) TABS tablet Take 1 tablet by mouth daily.   NON FORMULARY Heart Healthy Low Sodium Diet   NON FORMULARY Daily Weight Special Instructions: Notify MD for 3 lb weight gain in 1-2 days or 2 lbs overnight Once A Day 07:15 AM - 03:15 PM   NON FORMULARY Magic Cup TID with meals   ondansetron (ZOFRAN) 4 MG tablet Take 4 mg by mouth 3 (three) times daily before meals.    pantoprazole (PROTONIX) 40 MG tablet Take 40 mg by mouth 2 (two) times daily.   polyethylene glycol (MIRALAX / GLYCOLAX) 17 g packet Take 17 g by mouth daily as needed for mild constipation.   silver nitrate  applicators 55-37 % applicator Apply topically as directed to abdominal wound as needed for excessive bleeding. As Needed   SPIRIVA RESPIMAT 1.25 MCG/ACT AERS Inhale 1 puff into the lungs daily.    Wound Dressings (ALLEVYN ADHESIVE) PADS Apply topically See admin instructions. Apply to left ischium and change daily and PRN   [DISCONTINUED] ferrous sulfate (FERROUSUL) 325 (65 FE) MG tablet Take 1 tablet (325 mg total) by mouth 2 (two) times daily with a meal.   No facility-administered encounter medications on file as of 06/29/2019.      SIGNIFICANT DIAGNOSTIC EXAMS   PREVIOUS;   05-09-19: chest x-ray: No acute disease   05-17-19: EEG: This study is suggestive of moderate diffuse encephalopathy, non specific to etiology.  No seizures or epileptiform discharges were seen throughout the recording.   05-17-19: ct of head: Examination is significantly limited by motion artifact throughout. Within this limitation, no acute intracranial pathology. Small-vessel white matter disease.  06-08-19: ct of abdomen and pelvis:  1. Resolved fluid collection in the subcutaneous tissues of the lower abdominal wall. Residual skin irregularity but no soft tissue thickening or inflammatory change. No recurrent or new abscess. 2. Clustered nodular ground-glass opacities in the right lower lobe with associated bronchial thickening, suspicious for aspiration. Or pneumonia also considered. 3. Mild bladder wall thickening inferiorly, can be seen with urinary tract infection. Recommend correlation with urinalysis. 4. Colonic diverticulosis without diverticulitis. Aortic Atherosclerosis   06-08-19: chest x-ray; 1. No active cardiopulmonary disease. 2. Aortic atherosclerosis.  06-10-19: 2-d echo:   1. Left ventricular ejection fraction, by visual estimation, is 65 to 70%. The left ventricle has normal function. Normal left ventricular size. Left ventricular septal wall thickness was mildly increased. Mildly  increased left ventricular posterior  wall thickness.  2. Global right ventricle has normal systolic function.The right ventricular size is normal. No increase in right ventricular wall thickness.  3. Left atrial size was normal.  4. Right atrial size was normal.  5. The mitral valve is degenerative. Moderate mitral annular calcification. There is mild thickening of the mitral valve leaflet(s). There is mild calcification of the mitral valve leaflet(s). No evidence of mitral valve regurgitation. Mild mitral  stenosis.  6. The tricuspid valve is normal in structure. Tricuspid valve regurgitation is mild.  7. The aortic valve is normal in structure. Aortic valve regurgitation was not visualized by color flow Doppler. Structurally normal aortic valve, with no evidence of sclerosis or stenosis.  8. The pulmonic valve was normal in structure. Pulmonic valve regurgitation is trivial by color flow Doppler.  9. The inferior vena cava is normal in size with greater than 50% respiratory variability, suggesting right atrial pressure of 3 mmHg.  06-10-19: ct of head: 1. No acute or interval finding. ASPECTS is 10.   2. Atherosclerotic calcification.   06-11-19: MRI of brain" Negative for acute infarct  Atrophy and moderate chronic microvascular ischemia.   06-14-19: chest x-ray: Mild bibasilar atelectasis.   TODAY;   06-23-19: chest x-ray; no acute findings.     LABS REVIEWED PREVIOUS:   05-09-19:wbc 7.7; hgb 8.8; hct 31.4; mcv 89.5 ;plt 343; glucose 193; bun 9; creat 0.63; k+ 3.4; na++ 140; ca 7.5; liver normal albumin 2.1; BNP 461.0; hgb a1c 8.8 05-11-19: wbc 5.9; hgb 8.2; hct 29.4; mcv 89.1 plt 338; glucose 279; bun 12; creat 0.70; k+ 3.9; na++ 139; ca 7.3; mag 1.2 05-16-19: wbc 9.9; hgb 9.0; hct 30.3; mcv 85.1 pl 371; glucose 93; bun 10; creat 0.90; k+ 3.3; na++ 139; ca 8.1; mag 1.2 tsh 1.071 05-19-19: glucose 79; bun 8; creat 0.85; k+ 3.7; na++ 140; ca 7.9; mag 1.7 05-23-19: glucose 157; bun 13; creat  0.89; k+ 3.8; na++ 140; ca 7.9  06-01-19: glucose 109; bun 42; creat 1.35 ;k+ 5.2; na++ 137; ca 8.3; liver normal albumin 2.0; mag 1.3 06-07-19: wbc 15.1; hgb 10.6; hct 37.4; mcv 91.4 plt 526; glucose 305; bun 65; creat 2.24; k+ 6.5; na++ 131; ca 7.9; liver normal albumin 2.0; mag 1.5 urine culture: klebsiella pneumoniae ESBL 06-08-19: wbc 29.6; hgb 9.4; hct 31.6; mcv 87.1 plt 521; glucose 158; bun 62; creat 2.20; k+ 4.0; na++ 132; ca 7.5; mag 4.6; ferritin 181; CRP 11.7; blood culture: no growth; COVID 19 + 06-09-19: fibrinogen 347; d-dimer <0.27 06-10-19: wbc 22.5; hgb 7.3; hct 24.0; mcv 86.3 ;plt 329; glucose 117; bun 39; create 1.05; k+ 3.5; na++ 138; ca 7.0; liver normal albumin 1.3; vit B 12: 1391; folate 5.1; tsh 0.456 06-11-19: hgb a1c 7.4; chol 53; ldl 26; trig 86; hdl 11 06-12-19: wbc 10.3; hgb 7.4; hct 24.;1 mcv 85.2 ;plt 275; glucose 225; bun 24; creat 0.79; k+ 4.1; na++ 140; ca 7.6; liver normal albumin 1.2; mag 1.8; LDH 274; CRP 15.8 06-13-28: wbc 10.8; hgb 9.5; hct 30.4; mcv 89.1 plt 224; glucose 71; bun 26; creat 0.65; k+ 3.6; an++ 138; ca 7.2; liver normal albumin 1.3; CRP 9.4; ferritin 311 06-16-19: wbc 6.9; hgb 9.5; hct 30.2; mcv 88.6 ;plt 229; glucose 121; bun 22; creat 0.56; k+ 3.5; an++ 136; ca 7.0; liver normal albumin 3.1 CRP 9.4 06-20-19: glucose 132; bun 31; creat 0.98; k+ 4.9; na++ 131; ca 7.1 06-21-19: d-dimer: 0.34  NO NEW LABS.  Review of Systems  Constitutional: Negative for malaise/fatigue.  Respiratory: Negative for cough and shortness of breath.   Cardiovascular: Negative for chest pain, palpitations and leg swelling.  Gastrointestinal: Negative for abdominal pain, constipation and heartburn.  Musculoskeletal: Negative for back pain, joint pain and myalgias.  Skin: Negative.   Neurological: Negative for dizziness.  Psychiatric/Behavioral: The patient is not nervous/anxious.     Physical Exam Constitutional:      General: She is not in acute distress.     Appearance: She is well-developed. She is obese. She is not diaphoretic.  Neck:     Musculoskeletal: Neck supple.     Thyroid: No thyromegaly.  Cardiovascular:     Rate and Rhythm: Normal rate and regular rhythm.     Pulses: Normal pulses.     Heart sounds: Normal heart sounds.  Pulmonary:     Effort: Pulmonary effort is normal. No respiratory distress.     Breath sounds: Normal breath sounds.  Abdominal:     General: Bowel sounds are normal. There is no distension.     Palpations: Abdomen is soft.     Tenderness: There is no abdominal tenderness.     Comments: Has generalized tenderness to mild palpation  History of esophageal dilation       Musculoskeletal: Normal range of motion.     Right lower leg: No edema.     Left lower leg: No edema.     Comments:  Is able to move all extremities  History of left hip IM nail     Has swelling right arm midline has been removed    Lymphadenopathy:     Cervical: No cervical adenopathy.  Skin:    General: Skin is warm and dry.     Comments: Abdominal incisional dressing intact Has bruising Has hematoma right thigh    Neurological:     Mental Status: She is alert and oriented to person, place, and time.  Psychiatric:        Mood and Affect: Mood normal.      ASSESSMENT/ PLAN:  TODAY   1. Atrial fibrillation with RVR 2. GERD without esophagitis/nausea and vomiting 3. COVID 19 virus  Will stop lovenox Will begin elquis 5 mg twice daily  Will change zofran to every 6 hours as needed Will lower protonix to 40 mg daily  Will monitor her status Her goal is return back home.       MD is aware of resident's narcotic use and is in agreement with current plan of care. We will attempt to wean resident as appropriate.  Ok Edwards NP [facility]  Contact [phone] Monday through Friday 8am- 5pm  After hours call [phone]

## 2019-07-04 ENCOUNTER — Encounter: Payer: Self-pay | Admitting: Adult Health

## 2019-07-04 ENCOUNTER — Non-Acute Institutional Stay (SKILLED_NURSING_FACILITY): Payer: BC Managed Care – PPO | Admitting: Adult Health

## 2019-07-04 DIAGNOSIS — Z794 Long term (current) use of insulin: Secondary | ICD-10-CM

## 2019-07-04 DIAGNOSIS — J449 Chronic obstructive pulmonary disease, unspecified: Secondary | ICD-10-CM

## 2019-07-04 DIAGNOSIS — K219 Gastro-esophageal reflux disease without esophagitis: Secondary | ICD-10-CM | POA: Diagnosis not present

## 2019-07-04 DIAGNOSIS — E1122 Type 2 diabetes mellitus with diabetic chronic kidney disease: Secondary | ICD-10-CM | POA: Diagnosis not present

## 2019-07-04 DIAGNOSIS — E1165 Type 2 diabetes mellitus with hyperglycemia: Secondary | ICD-10-CM | POA: Diagnosis not present

## 2019-07-04 DIAGNOSIS — IMO0002 Reserved for concepts with insufficient information to code with codable children: Secondary | ICD-10-CM

## 2019-07-04 NOTE — Progress Notes (Signed)
Location:  [facility] Room Number: 158-P Place of Service:  SNF (31)   CODE STATUS: FULL CODE  Allergies  Allergen Reactions  . Feraheme [Ferumoxytol] Other (See Comments)    Back pain (yelling out with back pain)  . Propranolol Swelling    Pt states it may have been leg swelling  . Topamax [Topiramate] Other (See Comments)    hallucinations  . Latex Itching and Rash    Chief Complaint  Patient presents with  . 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     HPI:  She is a 68 year old short term rehab patient being seen for the management of her chronic illnesses: copd; gerd; diabetes. She has not had any further hypoglycemia episodes. She denies any cough or shortness of breath. She denies any nausea or vomiting   Past Medical History:  Diagnosis Date  . Atrial fibrillation ([place])   . CHF (congestive heart failure) ([place])   . Depression   . Diabetes mellitus without complication ([place])   . 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 Hypertension   . Morbid obesity ([place])   . Osteoporosis   . Urinary incontinence   . UTI (lower urinary tract infection) 01/2016   Cipro for Klebsiella pneumoniae isolate  . Wheelchair bound    bedbound    Past Surgical History:  Procedure Laterality Date  . ABDOMINAL HYSTERECTOMY    . APPLICATION OF WOUND VAC  03/09/2019   Procedure: APPLICATION OF WOUND VAC;  Surgeon: Virl Cagey, MD;  Location: [facility];  Service: General;;  . BIOPSY  09/06/2018   Procedure: BIOPSY;  Surgeon: Danie Binder, MD;  Location: [facility];  Service: Endoscopy;;  gastric bx's  . CESAREAN SECTION    . CHOLECYSTECTOMY    . ESOPHAGEAL DILATION   09/06/2018   Procedure: ESOPHAGEAL DILATION;  Surgeon: Danie Binder, MD;  Location: [facility];  Service: Endoscopy;;  . ESOPHAGOGASTRODUODENOSCOPY (EGD) WITH PROPOFOL N/A 09/06/2018   Procedure: ESOPHAGOGASTRODUODENOSCOPY (EGD) WITH PROPOFOL;  Surgeon: Danie Binder, MD;  Location[facility];  Service: Endoscopy;  Laterality: N/A;  dilatation  . FEMUR IM NAIL Left 02/20/2016  . FEMUR IM NAIL Left 02/20/2016   Procedure: INTRAMEDULLARY (IM) RETROGRADE FEMORAL NAILING;  Surgeon: Leandrew Koyanagi, MD;  Location: [facility];  Service: Orthopedics;  Laterality: Left;  . PANCREAS SURGERY  1967   1 cyst excised and one cyst drained  . TEE WITHOUT CARDIOVERSION N/A 04/05/2018   Procedure: TRANSESOPHAGEAL ECHOCARDIOGRAM (TEE);  Surgeon: Dorothy Spark, MD;  Location: [facility] [facility];  Service: Cardiovascular;  Laterality: N/A;  . [place]    . WOUND DEBRIDEMENT N/A 03/09/2019   Procedure: EXCISIONAL DEBRIDEMENT OF ABDOMINAL WOUND ULCERS;  Surgeon: Virl Cagey, MD;  Location: [facility];  Service: General;  Laterality: N/A;  . WRIST FRACTURE SURGERY      Social History   Socioeconomic History  . Marital status: Married    Spouse name: 1  . Number of children: Not on file  . Years of education: Not on file  . Highest education level: Not on file  Occupational History  . Occupation: retired  Scientific laboratory technician  .  Financial resource strain: Not on file  . Food insecurity    Worry: Not on file    Inability: Not on file  . Transportation needs    Medical: No    Non-medical: No  Tobacco Use  . Smoking status: Never Smoker  . Smokeless tobacco: Never Used  Substance and Sexual Activity  . Alcohol use: No  . Drug use: No  . Sexual activity: Not on file  Lifestyle  . Physical activity    Days per week: Not on file    Minutes per session: Not on file  . Stress: Not on file  Relationships  . Social Herbalist on phone: Not on file    Gets together: Not on file     Attends religious service: Not on file    Active member of club or organization: Not on file    Attends meetings of clubs or organizations: Not on file    Relationship status: Not on file  . Intimate partner violence    Fear of current or ex partner: Not on file    Emotionally abused: Not on file    Physically abused: Not on file    Forced sexual activity: Not on file  Other Topics Concern  . Not on file  Social History Narrative   Lives in [place] with husband.  More or less w/c bound since leg fx about 3 yrs ago.   Family History  Problem Relation Age of Onset  . Diabetes Mother        died in her 43's of a stroke  . Cancer Mother   . Stroke Mother   . Breast cancer Mother   . Diabetes Father        died in his 41's of a stroke.  . Stroke Father   . Diabetes Brother        died @ 37 of a stroke.  . Stroke Brother   . Diabetes Maternal Grandmother   . Diabetes Maternal Grandfather   . Diabetes Paternal Grandmother   . Diabetes Paternal Grandfather   . Breast cancer Maternal Aunt       VITAL SIGNS BP 126/75   Pulse 81   Temp 97.8 F (36.6 C) (Oral)   Resp 19   Ht 5' 4"  (1.626 m)   Wt 191 lb 9.6 oz (86.9 kg)   BMI 32.89 kg/m   Outpatient Encounter Medications as of 07/04/2019  Medication Sig  . acetaminophen (TYLENOL) 325 MG tablet Take 650 mg by mouth every 6 (six) hours as needed.   Marland Kitchen albuterol (PROVENTIL HFA;VENTOLIN HFA) 108 (90 Base) MCG/ACT inhaler Inhale 2 puffs into the lungs every 6 (six) hours as needed for wheezing or shortness of breath.  . Amino Acids-Protein Hydrolys (FEEDING SUPPLEMENT, PRO-STAT SUGAR FREE 64,) LIQD Take 30 mLs by mouth 3 (three) times daily.  . bisacodyl (DULCOLAX) 10 MG suppository Place 10 mg rectally as needed for moderate constipation.  . calcium carbonate (TUMS EX) 750 MG chewable tablet Chew 1 tablet by mouth 3 (three) times daily.   . collagenase (SANTYL) ointment Apply 1 application topically See admin instructions.  Cleanse abdominal abrasion with NS, apply Santyl and cover with Allevyn foam dressing daily and PRN until resolved.  Cleanse left lateral thigh with NS and apply Santyl with Allevyn daily and PRN for abrasion until resolved.  . diltiazem (DILACOR XR) 180 MG 24 hr capsule Take 180 mg by mouth daily.  Marland Kitchen enoxaparin (LOVENOX) 80 MG/0.8ML injection  Inject 80 mg into the skin every 12 (twelve) hours.  . ergocalciferol (VITAMIN D2) 1.25 MG (50000 UT) capsule Take 50,000 Units by mouth once a week. Wednesdays  . feeding supplement, ENSURE ENLIVE, (ENSURE ENLIVE) LIQD Take 237 mLs by mouth 3 (three) times daily between meals.  . ferrous sulfate 325 (65 FE) MG tablet Take 325 mg by mouth daily with breakfast.  . fluticasone (FLONASE) 50 MCG/ACT nasal spray Place 2 sprays into both nostrils daily as needed for allergies or rhinitis (congestion).   . folic acid (FOLVITE) 1 MG tablet Take 1 tablet (1 mg total) by mouth daily.  Marland Kitchen glucagon (GLUCAGEN HYPOKIT) 1 MG SOLR injection Inject 1 mg into the vein as needed for low blood sugar.  . insulin aspart (NOVOLOG FLEXPEN) 100 UNIT/ML FlexPen Inject 3 Units into the skin 3 (three) times daily with meals.   Marland Kitchen levalbuterol (XOPENEX) 0.63 MG/3ML nebulizer solution Take 0.63 mg by nebulization every 6 (six) hours as needed for wheezing or shortness of breath.   . metoprolol tartrate (LOPRESSOR) 50 MG tablet Take 50 mg by mouth 2 (two) times daily.   . Multiple Vitamin (MULTIVITAMIN WITH MINERALS) TABS tablet Take 1 tablet by mouth daily.  . NON FORMULARY Heart Healthy Low Sodium Diet  . NON FORMULARY Daily Weight Special Instructions: Notify MD for 3 lb weight gain in 1-2 days or 2 lbs overnight Once A Day 07:15 AM - 03:15 PM  . NON FORMULARY Magic Cup TID with meals  . ondansetron (ZOFRAN) 4 MG tablet Take 4 mg by mouth 3 (three) times daily before meals.   . pantoprazole (PROTONIX) 40 MG tablet Take 40 mg by mouth 2 (two) times daily.  . polyethylene glycol (MIRALAX  / GLYCOLAX) 17 g packet Take 17 g by mouth daily as needed for mild constipation.  . silver nitrate applicators 67-12 % applicator Apply topically as directed to abdominal wound as needed for excessive bleeding. As Needed  . SPIRIVA RESPIMAT 1.25 MCG/ACT AERS Inhale 1 puff into the lungs daily.   . Wound Dressings (ALLEVYN ADHESIVE) PADS Apply topically See admin instructions. Apply to left ischium and change daily and PRN   No facility-administered encounter medications on file as of 07/04/2019.      SIGNIFICANT DIAGNOSTIC EXAMS   PREVIOUS;   05-09-19: chest x-ray: No acute disease   05-17-19: EEG: This study is suggestive of moderate diffuse encephalopathy, non specific to etiology.  No seizures or epileptiform discharges were seen throughout the recording.   05-17-19: ct of head: Examination is significantly limited by motion artifact throughout. Within this limitation, no acute intracranial pathology. Small-vessel white matter disease.  06-08-19: ct of abdomen and pelvis:  1. Resolved fluid collection in the subcutaneous tissues of the lower abdominal wall. Residual skin irregularity but no soft tissue thickening or inflammatory change. No recurrent or new abscess. 2. Clustered nodular ground-glass opacities in the right lower lobe with associated bronchial thickening, suspicious for aspiration. Or pneumonia also considered. 3. Mild bladder wall thickening inferiorly, can be seen with urinary tract infection. Recommend correlation with urinalysis. 4. Colonic diverticulosis without diverticulitis. Aortic Atherosclerosis   06-08-19: chest x-ray; 1. No active cardiopulmonary disease. 2. Aortic atherosclerosis.  06-10-19: 2-d echo:   1. Left ventricular ejection fraction, by visual estimation, is 65 to 70%. The left ventricle has normal function. Normal left ventricular size. Left ventricular septal wall thickness was mildly increased. Mildly increased left ventricular posterior  wall  thickness.  2. Global right ventricle has normal  systolic function.The right ventricular size is normal. No increase in right ventricular wall thickness.  3. Left atrial size was normal.  4. Right atrial size was normal.  5. The mitral valve is degenerative. Moderate mitral annular calcification. There is mild thickening of the mitral valve leaflet(s). There is mild calcification of the mitral valve leaflet(s). No evidence of mitral valve regurgitation. Mild mitral  stenosis.  6. The tricuspid valve is normal in structure. Tricuspid valve regurgitation is mild.  7. The aortic valve is normal in structure. Aortic valve regurgitation was not visualized by color flow Doppler. Structurally normal aortic valve, with no evidence of sclerosis or stenosis.  8. The pulmonic valve was normal in structure. Pulmonic valve regurgitation is trivial by color flow Doppler.  9. The inferior vena cava is normal in size with greater than 50% respiratory variability, suggesting right atrial pressure of 3 mmHg.  06-10-19: ct of head: 1. No acute or interval finding. ASPECTS is 10.   2. Atherosclerotic calcification.   06-11-19: MRI of brain" Negative for acute infarct  Atrophy and moderate chronic microvascular ischemia.   06-14-19: chest x-ray: Mild bibasilar atelectasis.   TODAY;   06-23-19: chest x-ray; no acute findings.     LABS REVIEWED PREVIOUS:   05-09-19:wbc 7.7; hgb 8.8; hct 31.4; mcv 89.5 ;plt 343; glucose 193; bun 9; creat 0.63; k+ 3.4; na++ 140; ca 7.5; liver normal albumin 2.1; BNP 461.0; hgb a1c 8.8 05-11-19: wbc 5.9; hgb 8.2; hct 29.4; mcv 89.1 plt 338; glucose 279; bun 12; creat 0.70; k+ 3.9; na++ 139; ca 7.3; mag 1.2 05-16-19: wbc 9.9; hgb 9.0; hct 30.3; mcv 85.1 pl 371; glucose 93; bun 10; creat 0.90; k+ 3.3; na++ 139; ca 8.1; mag 1.2 tsh 1.071 05-19-19: glucose 79; bun 8; creat 0.85; k+ 3.7; na++ 140; ca 7.9; mag 1.7 05-23-19: glucose 157; bun 13; creat 0.89; k+ 3.8; na++ 140; ca 7.9  06-01-19:  glucose 109; bun 42; creat 1.35 ;k+ 5.2; na++ 137; ca 8.3; liver normal albumin 2.0; mag 1.3 06-07-19: wbc 15.1; hgb 10.6; hct 37.4; mcv 91.4 plt 526; glucose 305; bun 65; creat 2.24; k+ 6.5; na++ 131; ca 7.9; liver normal albumin 2.0; mag 1.5 urine culture: klebsiella pneumoniae ESBL 06-08-19: wbc 29.6; hgb 9.4; hct 31.6; mcv 87.1 plt 521; glucose 158; bun 62; creat 2.20; k+ 4.0; na++ 132; ca 7.5; mag 4.6; ferritin 181; CRP 11.7; blood culture: no growth; COVID 19 + 06-09-19: fibrinogen 347; d-dimer <0.27 06-10-19: wbc 22.5; hgb 7.3; hct 24.0; mcv 86.3 ;plt 329; glucose 117; bun 39; create 1.05; k+ 3.5; na++ 138; ca 7.0; liver normal albumin 1.3; vit B 12: 1391; folate 5.1; tsh 0.456 06-11-19: hgb a1c 7.4; chol 53; ldl 26; trig 86; hdl 11 06-12-19: wbc 10.3; hgb 7.4; hct 24.;1 mcv 85.2 ;plt 275; glucose 225; bun 24; creat 0.79; k+ 4.1; na++ 140; ca 7.6; liver normal albumin 1.2; mag 1.8; LDH 274; CRP 15.8 06-13-28: wbc 10.8; hgb 9.5; hct 30.4; mcv 89.1 plt 224; glucose 71; bun 26; creat 0.65; k+ 3.6; an++ 138; ca 7.2; liver normal albumin 1.3; CRP 9.4; ferritin 311 06-16-19: wbc 6.9; hgb 9.5; hct 30.2; mcv 88.6 ;plt 229; glucose 121; bun 22; creat 0.56; k+ 3.5; an++ 136; ca 7.0; liver normal albumin 3.1 CRP 9.4 06-20-19: glucose 132; bun 31; creat 0.98; k+ 4.9; na++ 131; ca 7.1 06-21-19: d-dimer: 0.34  NO NEW LABS.   Review of Systems  Constitutional: Negative for malaise/fatigue.  Respiratory: Negative for cough and shortness  of breath.   Cardiovascular: Negative for chest pain, palpitations and leg swelling.  Gastrointestinal: Negative for abdominal pain, constipation and heartburn.  Musculoskeletal: Negative for back pain, joint pain and myalgias.  Skin: Negative.   Neurological: Negative for dizziness.  Psychiatric/Behavioral: The patient is not nervous/anxious.     Physical Exam Constitutional:      General: She is not in acute distress.    Appearance: She is well-developed. She is  obese. She is not diaphoretic.  Neck:     Musculoskeletal: Neck supple.     Thyroid: No thyromegaly.  Cardiovascular:     Rate and Rhythm: Normal rate and regular rhythm.     Pulses: Normal pulses.     Heart sounds: Normal heart sounds.  Pulmonary:     Effort: Pulmonary effort is normal. No respiratory distress.     Breath sounds: Normal breath sounds.     Comments: Has generalized tenderness to mild palpation  History of esophageal dilation       Abdominal:     General: Bowel sounds are normal. There is no distension.     Palpations: Abdomen is soft.     Tenderness: There is no abdominal tenderness.  Musculoskeletal:     Right lower leg: No edema.     Left lower leg: No edema.     Comments: Is able to move all extremities  History of left hip IM nail     Has swelling right arm midline has been removed     Lymphadenopathy:     Cervical: No cervical adenopathy.  Skin:    General: Skin is warm and dry.     Comments: Abdominal incisional dressing intact Has bruising Has hematoma right thigh     Neurological:     Mental Status: She is alert and oriented to person, place, and time.  Psychiatric:        Mood and Affect: Mood normal.     ASSESSMENT/ PLAN:  TODAY;   1. Chronic obstructive pulmonary disease unspecified copd type: is stable will continue spirivia respimet 1.25 mcg 1 puff daily; albuterol 2 puffs every 6 hours as needed and xopenex neb every 6 hours as needed  2. GERD without esophagitis: will change to protonix 40 mg daily and zofran 4 mg every 6 hours as needed   3. 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    PREVIOUS   4. CKD stage 3 due to type 2 diabetes mellitus: is stable bun 31 creat 0.98 will monitor   5. 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  Anemia due to stage 3a chronic kidney disease: is stable hgb 9.5 is status post PRBC transfusion; will continue iron daily   7. Protein calorie malnutrition severe: albumin 1.3 will continue prostat 30 cc three times daily and ensure 3 times daily   8. Hypocalcemia: is without change: ca 7.1 will continue tums 750 mg three times daily will monitor  9. Vit D deficiency is stable will continue vitamin D 50,000 units weekly will monitor   10. Atrial fibrillation with tachycardic ventricular rate: is stable will continue eliquis 5 mg  twice daily; cardizem cd 180 mg daily and lopressor 50 mg twice daily for rate control  11 Acute on chronic  diastolic CHF (congestive heart failure) is stable EF 65-70% (06-10-19) will continue lopressor 50 mg twice daily is currently not on diuretic.    13. Hypertension associated with type 2 diabetes mellitus: is stable b/p 126/75 will continue lopressor 50 mg twice daily    MD is aware of resident's narcotic use and is in agreement with current plan of care. We will attempt to wean resident as appropriate.  Ok Edwards NP [facility]  Contact [phone] Monday through Friday 8am- 5pm  After hours call [phone]

## 2019-07-05 ENCOUNTER — Encounter: Payer: Self-pay | Admitting: Adult Health

## 2019-07-05 ENCOUNTER — Non-Acute Institutional Stay (SKILLED_NURSING_FACILITY): Payer: BC Managed Care – PPO | Admitting: Adult Health

## 2019-07-05 ENCOUNTER — Other Ambulatory Visit: Payer: Self-pay | Admitting: Adult Health

## 2019-07-05 DIAGNOSIS — J449 Chronic obstructive pulmonary disease, unspecified: Secondary | ICD-10-CM | POA: Diagnosis not present

## 2019-07-05 DIAGNOSIS — I4891 Unspecified atrial fibrillation: Secondary | ICD-10-CM | POA: Diagnosis not present

## 2019-07-05 DIAGNOSIS — I5032 Chronic diastolic (congestive) heart failure: Secondary | ICD-10-CM | POA: Diagnosis not present

## 2019-07-05 MED ORDER — ONDANSETRON HCL 4 MG PO TABS: 4.0000 mg | ORAL_TABLET | Freq: Four times a day (QID) | ORAL | 0 refills | Status: AC | PRN

## 2019-07-05 MED ORDER — NOVOLOG FLEXPEN 100 UNIT/ML ~~LOC~~ SOPN: 3.0000 [IU] | PEN_INJECTOR | Freq: Three times a day (TID) | SUBCUTANEOUS | 0 refills | Status: AC

## 2019-07-05 MED ORDER — APIXABAN 5 MG PO TABS: 5.0000 mg | ORAL_TABLET | Freq: Two times a day (BID) | ORAL | 0 refills | Status: AC

## 2019-07-05 MED ORDER — ERGOCALCIFEROL 1.25 MG (50000 UT) PO CAPS: 50000.0000 [IU] | ORAL_CAPSULE | ORAL | 0 refills | Status: AC

## 2019-07-05 MED ORDER — SPIRIVA RESPIMAT 1.25 MCG/ACT IN AERS: 1.0000 | INHALATION_SPRAY | Freq: Every day | RESPIRATORY_TRACT | 0 refills | Status: AC

## 2019-07-05 MED ORDER — LEVALBUTEROL HCL 0.63 MG/3ML IN NEBU: 0.6300 mg | INHALATION_SOLUTION | Freq: Four times a day (QID) | RESPIRATORY_TRACT | 0 refills | Status: AC | PRN

## 2019-07-05 MED ORDER — FOLIC ACID 1 MG PO TABS: 1.0000 mg | ORAL_TABLET | Freq: Every day | ORAL | 0 refills | Status: AC

## 2019-07-05 MED ORDER — METOPROLOL TARTRATE 50 MG PO TABS: 50.0000 mg | ORAL_TABLET | Freq: Two times a day (BID) | ORAL | 0 refills | Status: AC

## 2019-07-05 MED ORDER — ALBUTEROL SULFATE HFA 108 (90 BASE) MCG/ACT IN AERS: 2.0000 | INHALATION_SPRAY | Freq: Four times a day (QID) | RESPIRATORY_TRACT | 0 refills | Status: AC | PRN

## 2019-07-05 MED ORDER — FERROUS SULFATE 325 (65 FE) MG PO TABS: 325.0000 mg | ORAL_TABLET | Freq: Every day | ORAL | 0 refills | Status: AC

## 2019-07-05 MED ORDER — PANTOPRAZOLE SODIUM 40 MG PO TBEC: 40.0000 mg | DELAYED_RELEASE_TABLET | Freq: Every day | ORAL | 0 refills | Status: AC

## 2019-07-05 MED ORDER — DILTIAZEM HCL ER 180 MG PO CP24: 180.0000 mg | ORAL_CAPSULE | Freq: Every day | ORAL | 0 refills | Status: AC

## 2019-07-05 NOTE — Progress Notes (Signed)
Location:    [place] Room Number: 158/P Place of Service:  SNF (31)    CODE STATUS: Full Code  Allergies  Allergen Reactions  . Feraheme [Ferumoxytol] Other (See Comments)    Back pain (yelling out with back pain)  . Propranolol Swelling    Pt states it may have been leg swelling  . Topamax [Topiramate] Other (See Comments)    hallucinations  . Latex Itching and Rash    Chief Complaint  Patient presents with  . Discharge Note    Discharge Visit    HPI:  She is being discharged to home with home health for pt/ot. She will need a neb machine and supplies; hoyer lift and semi electric bed. She will need her prescriptions written and will need to follow up with her medical provider.  She has had a complex hospitalizations to include sepsis; and covid pneumonia. She was admitted to this facility for short term rehab. She is not yet standing independently. She is wanting to go home. He husband states that he will be able to meet her needs at home.    Past Medical History:  Diagnosis Date  . Atrial fibrillation ([place])   . CHF (congestive heart failure) ([place])   . Depression   . Diabetes mellitus without complication ([place])   . 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 Hypertension   . Morbid obesity ([place])   . Osteoporosis   . Urinary incontinence   . UTI (lower urinary tract infection) 01/2016   Cipro for Klebsiella pneumoniae isolate  . Wheelchair bound    bedbound    Past Surgical History:  Procedure Laterality Date  . ABDOMINAL HYSTERECTOMY    . APPLICATION OF WOUND VAC  03/09/2019   Procedure: APPLICATION OF WOUND VAC;  Surgeon: Virl Cagey, MD;  Location: [facility];  Service: General;;  . BIOPSY  09/06/2018   Procedure: BIOPSY;  Surgeon: Danie Binder, MD;  Location: [facility];  Service: Endoscopy;;  gastric bx's  . CESAREAN SECTION    . CHOLECYSTECTOMY    . ESOPHAGEAL DILATION  09/06/2018   Procedure: ESOPHAGEAL DILATION;  Surgeon: Danie Binder, MD;  Location: [facility];  Service: Endoscopy;;  . ESOPHAGOGASTRODUODENOSCOPY (EGD) WITH PROPOFOL N/A 09/06/2018   Procedure: ESOPHAGOGASTRODUODENOSCOPY (EGD) WITH PROPOFOL;  Surgeon: Danie Binder, MD;  Location: [facility];  Service: Endoscopy;  Laterality: N/A;  dilatation  . FEMUR IM NAIL Left 02/20/2016  . FEMUR IM NAIL Left 02/20/2016   Procedure: INTRAMEDULLARY (IM) RETROGRADE FEMORAL NAILING;  Surgeon: Leandrew Koyanagi, MD;  Location: [facility];  Service: Orthopedics;  Laterality: Left;  . PANCREAS SURGERY  1967   1 cyst excised and one cyst drained  . TEE WITHOUT CARDIOVERSION N/A 04/05/2018   Procedure: TRANSESOPHAGEAL ECHOCARDIOGRAM (TEE);  Surgeon: Dorothy Spark, MD;  Location: [facility];  Service: Cardiovascular;  Laterality: N/A;  . [place]    . WOUND DEBRIDEMENT N/A 03/09/2019   Procedure: EXCISIONAL DEBRIDEMENT OF ABDOMINAL WOUND ULCERS;  Surgeon: Virl Cagey, MD;  Location: [facility];  Service: General;  Laterality: N/A;  . WRIST FRACTURE SURGERY      Social History   Socioeconomic History  . Marital status: Married    Spouse name: 1  . Number of children: Not on file  . Years of education: Not on file  . Highest education level: Not on file  Occupational History  . Occupation: retired  Social Needs  . Financial resource strain: Not on file  . Food insecurity    Worry: Not on file    Inability: Not on file  . Transportation needs    Medical: No    Non-medical: No  Tobacco Use  . Smoking status: Never Smoker  . Smokeless tobacco: Never Used  Substance and Sexual Activity  . Alcohol use: No  . Drug use: No  . Sexual activity: Not on file  Lifestyle  . Physical activity    Days per week: Not on file    Minutes per session: Not on file  . Stress: Not on file  Relationships  . Social Herbalist on phone: Not on file    Gets together: Not on file    Attends  religious service: Not on file    Active member of club or organization: Not on file    Attends meetings of clubs or organizations: Not on file    Relationship status: Not on file  . Intimate partner violence    Fear of current or ex partner: Not on file    Emotionally abused: Not on file    Physically abused: Not on file    Forced sexual activity: Not on file  Other Topics Concern  . Not on file  Social History Narrative   Lives in [place] with husband.  More or less w/c bound since leg fx about 3 yrs ago.   Family History  Problem Relation Age of Onset  . Diabetes Mother        died in her 97's of a stroke  . Cancer Mother   . Stroke Mother   . Breast cancer Mother   . Diabetes Father        died in his 35's of a stroke.  . Stroke Father   . Diabetes Brother        died @ 84 of a stroke.  . Stroke Brother   . Diabetes Maternal Grandmother   . Diabetes Maternal Grandfather   . Diabetes Paternal Grandmother   . Diabetes Paternal Grandfather   . Breast cancer Maternal Aunt     VITAL SIGNS BP 109/78   Pulse 74   Temp (!) 97.1 F (36.2 C) (Oral)   Resp 20   Ht 5' 4"  (1.626 m)   Wt 189 lb 9.6 oz (86 kg)   BMI 32.54 kg/m   Patient's Medications  New Prescriptions   No medications on file  Previous Medications   ACETAMINOPHEN (TYLENOL) 325 MG TABLET    Take 650 mg by mouth every 6 (six) hours as needed.    ALBUTEROL (PROVENTIL HFA;VENTOLIN HFA) 108 (90 BASE) MCG/ACT INHALER    Inhale 2 puffs into the lungs every 6 (six) hours as needed for wheezing or shortness of breath.   AMINO ACIDS-PROTEIN HYDROLYS (FEEDING SUPPLEMENT, PRO-STAT SUGAR FREE 64,) LIQD    Take 30 mLs by mouth 3 (three) times daily.   APIXABAN (ELIQUIS) 5 MG TABS TABLET    Take 5 mg by mouth 2 (two) times daily.   BALSAM PERU-CASTOR OIL (VENELEX) OINT    Apply to medial left thigh and vaginal area every shift day, evening and night   BISACODYL (DULCOLAX) 10 MG SUPPOSITORY    Place 10 mg rectally as  needed for moderate constipation.   CALCIUM CARBONATE (TUMS EX) 750 MG CHEWABLE TABLET    Chew 1 tablet by mouth 3 (three) times daily.    DILTIAZEM (DILACOR XR)  180 MG 24 HR CAPSULE    Take 180 mg by mouth daily.   ERGOCALCIFEROL (VITAMIN D2) 1.25 MG (50000 UT) CAPSULE    Take 50,000 Units by mouth once a week. Wednesdays   FEEDING SUPPLEMENT, ENSURE ENLIVE, (ENSURE ENLIVE) LIQD    Take 237 mLs by mouth 3 (three) times daily between meals.   FERROUS SULFATE 325 (65 FE) MG TABLET    Take 325 mg by mouth daily with breakfast.   FLUTICASONE (FLONASE) 50 MCG/ACT NASAL SPRAY    Place 2 sprays into both nostrils daily as needed for allergies or rhinitis (congestion).    FOLIC ACID (FOLVITE) 1 MG TABLET    Take 1 tablet (1 mg total) by mouth daily.   GLUCAGON (GLUCAGEN HYPOKIT) 1 MG SOLR INJECTION    Inject 1 mg into the vein as needed for low blood sugar.   INSULIN ASPART (NOVOLOG FLEXPEN) 100 UNIT/ML FLEXPEN    Inject 3 Units into the skin 3 (three) times daily with meals. For cbg >= 200 hold if <50% of meal intake   LEVALBUTEROL (XOPENEX) 0.63 MG/3ML NEBULIZER SOLUTION    Take 0.63 mg by nebulization every 6 (six) hours as needed for wheezing or shortness of breath.    METOPROLOL TARTRATE (LOPRESSOR) 50 MG TABLET    Take 50 mg by mouth 2 (two) times daily.    MULTIPLE VITAMIN (MULTIVITAMIN WITH MINERALS) TABS TABLET    Take 1 tablet by mouth daily.   NON FORMULARY    Diet:Regular   NON FORMULARY    Daily Weight Special Instructions: Notify MD for 3 lb weight gain in 1-2 days or 2 lbs overnight Once A Day 07:15 AM - 03:15 PM   NON FORMULARY    Magic Cup TID with meals   ONDANSETRON (ZOFRAN) 4 MG TABLET    Take 4 mg by mouth every 6 (six) hours as needed for nausea.    PANTOPRAZOLE (PROTONIX) 40 MG TABLET    Take 40 mg by mouth daily.    POLYETHYLENE GLYCOL (MIRALAX / GLYCOLAX) 17 G PACKET    Take 17 g by mouth daily as needed for mild constipation.   SILVER NITRATE APPLICATORS 48-01 % APPLICATOR     Apply topically as directed to abdominal wound as needed for excessive bleeding. As Needed   SPIRIVA RESPIMAT 1.25 MCG/ACT AERS    Inhale 1 puff into the lungs daily.    WOUND DRESSINGS (ALLEVYN ADHESIVE) PADS    Apply topically See admin instructions. Apply to left ischium and change daily and PRN  Modified Medications   No medications on file  Discontinued Medications   COLLAGENASE (SANTYL) OINTMENT    Apply 1 application topically See admin instructions. Cleanse abdominal abrasion with NS, apply Santyl and cover with Allevyn foam dressing daily and PRN until resolved.  Cleanse left lateral thigh with NS and apply Santyl with Allevyn daily and PRN for abrasion until resolved.     SIGNIFICANT DIAGNOSTIC EXAMS   PREVIOUS;   05-09-19: chest x-ray: No acute disease   05-17-19: EEG: This study is suggestive of moderate diffuse encephalopathy, non specific to etiology.  No seizures or epileptiform discharges were seen throughout the recording.   05-17-19: ct of head: Examination is significantly limited by motion artifact throughout. Within this limitation, no acute intracranial pathology. Small-vessel white matter disease.  06-08-19: ct of abdomen and pelvis:  1. Resolved fluid collection in the subcutaneous tissues of the lower abdominal wall. Residual skin irregularity but no soft tissue thickening  or inflammatory change. No recurrent or new abscess. 2. Clustered nodular ground-glass opacities in the right lower lobe with associated bronchial thickening, suspicious for aspiration. Or pneumonia also considered. 3. Mild bladder wall thickening inferiorly, can be seen with urinary tract infection. Recommend correlation with urinalysis. 4. Colonic diverticulosis without diverticulitis. Aortic Atherosclerosis   06-08-19: chest x-ray; 1. No active cardiopulmonary disease. 2. Aortic atherosclerosis.  06-10-19: 2-d echo:   1. Left ventricular ejection fraction, by visual estimation, is 65 to  70%. The left ventricle has normal function. Normal left ventricular size. Left ventricular septal wall thickness was mildly increased. Mildly increased left ventricular posterior  wall thickness.  2. Global right ventricle has normal systolic function.The right ventricular size is normal. No increase in right ventricular wall thickness.  3. Left atrial size was normal.  4. Right atrial size was normal.  5. The mitral valve is degenerative. Moderate mitral annular calcification. There is mild thickening of the mitral valve leaflet(s). There is mild calcification of the mitral valve leaflet(s). No evidence of mitral valve regurgitation. Mild mitral  stenosis.  6. The tricuspid valve is normal in structure. Tricuspid valve regurgitation is mild.  7. The aortic valve is normal in structure. Aortic valve regurgitation was not visualized by color flow Doppler. Structurally normal aortic valve, with no evidence of sclerosis or stenosis.  8. The pulmonic valve was normal in structure. Pulmonic valve regurgitation is trivial by color flow Doppler.  9. The inferior vena cava is normal in size with greater than 50% respiratory variability, suggesting right atrial pressure of 3 mmHg.  06-10-19: ct of head: 1. No acute or interval finding. ASPECTS is 10.   2. Atherosclerotic calcification.   06-11-19: MRI of brain" Negative for acute infarct  Atrophy and moderate chronic microvascular ischemia.   06-14-19: chest x-ray: Mild bibasilar atelectasis.   06-23-19: chest x-ray; no acute findings.    TODAY;   06-28-19: right upper extremity venous doppler: negative for dvt.    LABS REVIEWED PREVIOUS:   05-09-19:wbc 7.7; hgb 8.8; hct 31.4; mcv 89.5 ;plt 343; glucose 193; bun 9; creat 0.63; k+ 3.4; na++ 140; ca 7.5; liver normal albumin 2.1; BNP 461.0; hgb a1c 8.8 05-11-19: wbc 5.9; hgb 8.2; hct 29.4; mcv 89.1 plt 338; glucose 279; bun 12; creat 0.70; k+ 3.9; na++ 139; ca 7.3; mag 1.2 05-16-19: wbc 9.9; hgb 9.0;  hct 30.3; mcv 85.1 pl 371; glucose 93; bun 10; creat 0.90; k+ 3.3; na++ 139; ca 8.1; mag 1.2 tsh 1.071 05-19-19: glucose 79; bun 8; creat 0.85; k+ 3.7; na++ 140; ca 7.9; mag 1.7 05-23-19: glucose 157; bun 13; creat 0.89; k+ 3.8; na++ 140; ca 7.9  06-01-19: glucose 109; bun 42; creat 1.35 ;k+ 5.2; na++ 137; ca 8.3; liver normal albumin 2.0; mag 1.3 06-07-19: wbc 15.1; hgb 10.6; hct 37.4; mcv 91.4 plt 526; glucose 305; bun 65; creat 2.24; k+ 6.5; na++ 131; ca 7.9; liver normal albumin 2.0; mag 1.5 urine culture: klebsiella pneumoniae ESBL 06-08-19: wbc 29.6; hgb 9.4; hct 31.6; mcv 87.1 plt 521; glucose 158; bun 62; creat 2.20; k+ 4.0; na++ 132; ca 7.5; mag 4.6; ferritin 181; CRP 11.7; blood culture: no growth; COVID 19 + 06-09-19: fibrinogen 347; d-dimer <0.27 06-10-19: wbc 22.5; hgb 7.3; hct 24.0; mcv 86.3 ;plt 329; glucose 117; bun 39; create 1.05; k+ 3.5; na++ 138; ca 7.0; liver normal albumin 1.3; vit B 12: 1391; folate 5.1; tsh 0.456 06-11-19: hgb a1c 7.4; chol 53; ldl 26; trig 86; hdl 11 06-12-19:  wbc 10.3; hgb 7.4; hct 24.;1 mcv 85.2 ;plt 275; glucose 225; bun 24; creat 0.79; k+ 4.1; na++ 140; ca 7.6; liver normal albumin 1.2; mag 1.8; LDH 274; CRP 15.8 06-13-28: wbc 10.8; hgb 9.5; hct 30.4; mcv 89.1 plt 224; glucose 71; bun 26; creat 0.65; k+ 3.6; an++ 138; ca 7.2; liver normal albumin 1.3; CRP 9.4; ferritin 311 06-16-19: wbc 6.9; hgb 9.5; hct 30.2; mcv 88.6 ;plt 229; glucose 121; bun 22; creat 0.56; k+ 3.5; an++ 136; ca 7.0; liver normal albumin 3.1 CRP 9.4 06-20-19: glucose 132; bun 31; creat 0.98; k+ 4.9; na++ 131; ca 7.1 06-21-19: d-dimer: 0.34  NO NEW LABS.   Review of Systems  Constitutional: Negative for malaise/fatigue.  Respiratory: Negative for cough and shortness of breath.   Cardiovascular: Negative for chest pain, palpitations and leg swelling.  Gastrointestinal: Negative for abdominal pain, constipation and heartburn.  Musculoskeletal: Negative for back pain, joint pain and  myalgias.  Skin: Negative.   Neurological: Negative for dizziness.  Psychiatric/Behavioral: The patient is not nervous/anxious.      Physical Exam Constitutional:      General: She is not in acute distress.    Appearance: She is well-developed. She is obese. She is not diaphoretic.  Neck:     Musculoskeletal: Neck supple.     Thyroid: No thyromegaly.  Cardiovascular:     Rate and Rhythm: Normal rate and regular rhythm.     Pulses: Normal pulses.     Heart sounds: Normal heart sounds.  Pulmonary:     Effort: Pulmonary effort is normal. No respiratory distress.     Breath sounds: Normal breath sounds.  Abdominal:     General: Bowel sounds are normal. There is no distension.     Palpations: Abdomen is soft.     Tenderness: There is no abdominal tenderness.     Comments: Has generalized tenderness to mild palpation  History of esophageal dilation        Musculoskeletal:     Right lower leg: No edema.     Left lower leg: No edema.     Comments: Is able to move all extremities  History of left hip IM nail     Has swelling right arm       Lymphadenopathy:     Cervical: No cervical adenopathy.  Skin:    General: Skin is warm and dry.  Neurological:     Mental Status: She is alert and oriented to person, place, and time.  Psychiatric:        Mood and Affect: Mood normal.     ASSESSMENT/ PLAN:  Patient is being discharged with the following home health services:  Pt/ot to evaluate and treat as indicated for gait balance strength adl training.   Patient is being discharged with the following durable medical equipment:  Nebulizer machine with supplies; hoyer lift for transfers. Semi-electric bed with half rails; in order for patient to maintain her current level of independence with her bed mobility such as turning and positioning which cannot be achieved with a standard bed.   Patient has been advised to f/u with their PCP in 1-2 weeks to bring them up to date on their rehab  stay.  Social services at facility was responsible for arranging this appointment.  Pt was provided with a 30 day supply of prescriptions for medications and refills must be obtained from their PCP.  For controlled substances, a more limited supply may be provided adequate until PCP appointment only.  A 30 day supply of her prescription medications have been sent to [facility] in [place]   Time spent with patient: 45 minutes: dme; medications and home health.     Ok Edwards NP [facility]  Contact [phone] Monday through Friday 8am- 5pm  After hours call [phone]

## 2019-07-07 ENCOUNTER — Encounter: Payer: Self-pay | Admitting: General Surgery

## 2019-07-07 ENCOUNTER — Ambulatory Visit (INDEPENDENT_AMBULATORY_CARE_PROVIDER_SITE_OTHER): Payer: BC Managed Care – PPO | Admitting: General Surgery

## 2019-07-07 VITALS — BP 102/63 | HR 80 | Temp 97.5°F | Resp 18 | Ht 64.0 in | Wt 193.0 lb

## 2019-07-07 DIAGNOSIS — L88 Pyoderma gangrenosum: Secondary | ICD-10-CM

## 2019-07-07 DIAGNOSIS — L97111 Non-pressure chronic ulcer of right thigh limited to breakdown of skin: Secondary | ICD-10-CM | POA: Diagnosis not present

## 2019-07-07 DIAGNOSIS — S31109D Unspecified open wound of abdominal wall, unspecified quadrant without penetration into peritoneal cavity, subsequent encounter: Secondary | ICD-10-CM

## 2019-07-07 DIAGNOSIS — L97121 Non-pressure chronic ulcer of left thigh limited to breakdown of skin: Secondary | ICD-10-CM | POA: Diagnosis not present

## 2019-07-07 DIAGNOSIS — L089 Local infection of the skin and subcutaneous tissue, unspecified: Secondary | ICD-10-CM

## 2019-07-07 MED ORDER — AMOXICILLIN-POT CLAVULANATE 875-125 MG PO TABS: 1.0000 | ORAL_TABLET | Freq: Two times a day (BID) | ORAL | 0 refills | Status: AC

## 2019-07-07 NOTE — Progress Notes (Signed)
[facility] Note   HPI:  68 y.o. 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 She had been on steroids at one point but came off after having issues with her blood sugar and multiple repeat admissions. She has now developed more wounds on her thighs and has necrotic eschar on these wounds. She has right arm swelling, and is in generally a significant amount of pain. She was COVID positive 3 weeks ago.   I spoke with the wound RN Santiago Glad, who has been helping with her wounds. She agrees the abdominal wound has enlarged and has more fibrinous material. She was out for a few weeks, but when she came back the wound was looking worse from her prior management, and there were bilateral thigh wounds right side worse than the left side. The wounds on the thighs developed from reported blisters and developed a necrotic eschar in a matter of days.  Santiago Glad reports that the RUE swelling is chronic and they patient has had DVT studies.  I have not found these in the chart for [facility].  The RUE swelling per report has been worse and is improving.  No provider was available to speak with me today.   Review of Systems:  Pain at the thighs  RUE swelling  All other review of systems: otherwise negative   Vital Signs:  BP 102/63 (BP Location: Left Arm, Patient Position: Sitting, Cuff Size: Normal)   Pulse 80   Temp (!) 97.5 F (36.4 C) (Oral)   Resp 18   Ht 5' 4"  (1.626 m)   Wt 193 lb (87.5 kg)   SpO2 98%   BMI 33.13 kg/m    Physical Exam:  Physical Exam Vitals signs reviewed.  Constitutional:      Appearance: She is obese.  HENT:     Head: Normocephalic.     Nose: Nose normal.  Eyes:     Pupils: Pupils are equal, round, and reactive to light.  Cardiovascular:     Rate and Rhythm: Normal rate.  Pulmonary:     Effort: Pulmonary effort is normal.  Skin:    Comments: Abdominal wound with fibrinous material medial,  debridement with scissors performed, repacked with saline dampened gauze, wound is wider than when I saw prior  Bilateral legs wounds: right thigh with necrotic eschar and adipose tissue exposed laterally, wound is well over 10cm in size, left thigh with multiple ulcerated areas with some drainage, mild erythema around the groin and the left leg area   Neurological:     General: No focal deficit present.     Mental Status: She is alert.         Assessment:  68 y.o. yo Female with multiple wounds now on her thighs and on her abdomen. Packing being performed to the abdominal wound and some packing on the right lateral aspect of thigh per Santiago Glad report. Moisture barrier ointment on the left thigh. I am concerned there is some developing cellulitis on the left given the redness and drainage.  The wounds on the abdomen and the thighs are likely related to the pyoderma gangrenosum, and I have repeated tried to get the patient to dermatology so she can get treatment with steroids as needed. She was ultimately taken off the steroids previously due to blood sugar issues.  I am concerned that any debridement of these wounds will lead to further none healing and more wounds and that she needs more medical  management. We had discussed this prior to her initial abdominal wound debridement.   Plan:  - Augmentin for the cellulitis given the redness   - Needs urgent Dermatology follow up as this needs to have steroid/ immunologic treatment - I am skeptical that she will be able to be discharged from the [facility] tomorrow given her wounds and the extent of care. She will end back in the hospital. Given that [facility] does not have Dermatology, it would be a good idea to get inpatient dermatology evaluation the next admission if she has yet to see anyone.   - Needs steroids started for Pyoderma Gangrenosum but I do not prescribe this medication or treat this issue medically /Karen to discuss with providers at  [facility] since they were not available today  - Poor prognosis overall given the extent of the wounds and the overall deterioration of her health in the last few months  - Continue wet to dry dressing to the abdominal wound. Continue dry dressing to medial aspect of right leg wound and wet to dry on the lateral aspect. Wound do daily to twice daily as available.  Continue barrier ointment and possible transition to wet to dry if the wounds on the left become deeper.   Future Appointments  Date Time Provider [place]  07/21/2019  1:00 PM Virl Cagey, MD [facility] None  08/04/2019 11:00 AM Herminio Commons, MD [facility] [place], MD [facility] [address] [place],  [address] [phone] (office)

## 2019-07-08 ENCOUNTER — Emergency Department (HOSPITAL_COMMUNITY): Payer: BC Managed Care – PPO

## 2019-07-08 ENCOUNTER — Encounter (HOSPITAL_COMMUNITY): Payer: Self-pay | Admitting: *Deleted

## 2019-07-08 ENCOUNTER — Emergency Department (HOSPITAL_COMMUNITY)
Admission: EM | Admit: 2019-07-08 | Discharge: 2019-07-19 | Disposition: E | Payer: BC Managed Care – PPO | Attending: Emergency Medicine | Admitting: Emergency Medicine

## 2019-07-08 ENCOUNTER — Other Ambulatory Visit: Payer: Self-pay

## 2019-07-08 ENCOUNTER — Other Ambulatory Visit: Payer: Self-pay | Admitting: Adult Health

## 2019-07-08 DIAGNOSIS — N1831 Chronic kidney disease, stage 3a: Secondary | ICD-10-CM | POA: Insufficient documentation

## 2019-07-08 DIAGNOSIS — R404 Transient alteration of awareness: Secondary | ICD-10-CM | POA: Diagnosis not present

## 2019-07-08 DIAGNOSIS — I469 Cardiac arrest, cause unspecified: Secondary | ICD-10-CM | POA: Diagnosis not present

## 2019-07-08 DIAGNOSIS — Z79899 Other long term (current) drug therapy: Secondary | ICD-10-CM | POA: Diagnosis not present

## 2019-07-08 DIAGNOSIS — Z6833 Body mass index (BMI) 33.0-33.9, adult: Secondary | ICD-10-CM | POA: Diagnosis not present

## 2019-07-08 DIAGNOSIS — E669 Obesity, unspecified: Secondary | ICD-10-CM | POA: Diagnosis not present

## 2019-07-08 DIAGNOSIS — E162 Hypoglycemia, unspecified: Secondary | ICD-10-CM | POA: Diagnosis not present

## 2019-07-08 DIAGNOSIS — Z91040 Latex allergy status: Secondary | ICD-10-CM | POA: Insufficient documentation

## 2019-07-08 DIAGNOSIS — R918 Other nonspecific abnormal finding of lung field: Secondary | ICD-10-CM | POA: Diagnosis not present

## 2019-07-08 DIAGNOSIS — J449 Chronic obstructive pulmonary disease, unspecified: Secondary | ICD-10-CM | POA: Diagnosis not present

## 2019-07-08 DIAGNOSIS — R0681 Apnea, not elsewhere classified: Secondary | ICD-10-CM | POA: Diagnosis not present

## 2019-07-08 DIAGNOSIS — E1165 Type 2 diabetes mellitus with hyperglycemia: Secondary | ICD-10-CM | POA: Diagnosis not present

## 2019-07-08 DIAGNOSIS — E161 Other hypoglycemia: Secondary | ICD-10-CM | POA: Diagnosis not present

## 2019-07-08 DIAGNOSIS — I5032 Chronic diastolic (congestive) heart failure: Secondary | ICD-10-CM | POA: Insufficient documentation

## 2019-07-08 DIAGNOSIS — E1122 Type 2 diabetes mellitus with diabetic chronic kidney disease: Secondary | ICD-10-CM | POA: Diagnosis not present

## 2019-07-08 DIAGNOSIS — R092 Respiratory arrest: Secondary | ICD-10-CM

## 2019-07-08 DIAGNOSIS — Z20828 Contact with and (suspected) exposure to other viral communicable diseases: Secondary | ICD-10-CM | POA: Insufficient documentation

## 2019-07-08 DIAGNOSIS — Z794 Long term (current) use of insulin: Secondary | ICD-10-CM | POA: Diagnosis not present

## 2019-07-08 DIAGNOSIS — I130 Hypertensive heart and chronic kidney disease with heart failure and stage 1 through stage 4 chronic kidney disease, or unspecified chronic kidney disease: Secondary | ICD-10-CM | POA: Diagnosis not present

## 2019-07-08 LAB — CBG MONITORING, ED
Glucose-Capillary: 91 mg/dL (ref 70–99)
Glucose-Capillary: 77 mg/dL (ref 70–99)

## 2019-07-08 LAB — BLOOD GAS, ARTERIAL
Acid-base deficit: 19.4 mmol/L — ABNORMAL HIGH (ref 0.0–2.0)
Bicarbonate: 9.9 mmol/L — ABNORMAL LOW (ref 20.0–28.0)
FIO2: 100
O2 Saturation: 99 %
Patient temperature: 37
pCO2 arterial: 23 mmHg — ABNORMAL LOW (ref 32.0–48.0)
pH, Arterial: 7.155 — CL (ref 7.350–7.450)
pO2, Arterial: 416 mmHg — ABNORMAL HIGH (ref 83.0–108.0)

## 2019-07-08 LAB — POC SARS CORONAVIRUS 2 AG -  ED: SARS Coronavirus 2 Ag: NEGATIVE

## 2019-07-08 MED ORDER — EPINEPHRINE HCL 5 MG/250ML IV SOLN IN NS
0.5000 ug/min | INTRAVENOUS | Status: DC
  Filled 2019-07-08: qty 250

## 2019-07-08 MED ORDER — EPINEPHRINE PF 1 MG/ML IJ SOLN
0.5000 ug/min | INTRAVENOUS | Status: DC
  Administered 2019-07-08: 0.5 ug/min via INTRAVENOUS
  Filled 2019-07-08: qty 4

## 2019-07-08 MED ORDER — DEXTROSE 50 % IV SOLN
INTRAVENOUS | Status: AC
  Administered 2019-07-08: 18:00:00
  Filled 2019-07-08: qty 50

## 2019-07-08 MED ORDER — EPINEPHRINE PF 1 MG/ML IJ SOLN
1.0000 mg | Freq: Once | INTRAMUSCULAR | Status: AC
  Administered 2019-07-08: 1 mg via INTRAVENOUS

## 2019-07-08 MED ORDER — ATROPINE SULFATE 1 MG/ML IJ SOLN
1.0000 mg | Freq: Once | INTRAMUSCULAR | Status: AC
  Administered 2019-07-08: 19:00:00 1 mg via INTRAVENOUS

## 2019-07-08 MED ORDER — LORAZEPAM 2 MG/ML IJ SOLN
INTRAMUSCULAR | Status: AC
  Administered 2019-07-08: 1 mg
  Filled 2019-07-08: qty 1

## 2019-07-08 MED ORDER — PREDNISONE 10 MG PO TABS: 10.0000 mg | ORAL_TABLET | Freq: Every day | ORAL | 0 refills | Status: AC

## 2019-07-12 MED FILL — Medication: Qty: 1 | Status: AC

## 2019-07-19 NOTE — ED Triage Notes (Addendum)
Pt brought in from home by [facility] with c/o unresponsive that started about 1 hour ago. Pt was found unresponsive by husband with blood glucose of 50. Glucagon and 1 amp D50 given by EMS via IO and most recent CBG 249. OPA in place, no gag reflex per EMS. Wounds to right leg. EMS reports periods of agonal breathing.

## 2019-07-19 NOTE — ED Notes (Signed)
Xray at bedside to obtain portable CXR to confirm ET tube placement.

## 2019-07-19 NOTE — ED Notes (Signed)
CPR started. HR 30, no pulses felt by EDP.

## 2019-07-19 NOTE — ED Notes (Signed)
Called for stat portable CXR

## 2019-07-19 NOTE — ED Provider Notes (Signed)
[facility] EMERGENCY DEPARTMENT Provider Note   CSN: [id number] Arrival date & time: 07-25-19  1659     History   Chief Complaint Chief Complaint  Patient presents with  . Unresponsive    HPI [name] is a 68 y.o. female.     HPI   Patient arrived by EMS, history of being found unresponsive, by her husband.  She was discharged from rehab yesterday after treatment for pyoderma gangrenosum.  It is unknown what preceded the collapse today.  Level 5 caveat-severe illness.  Past Medical History:  Diagnosis Date  . Atrial fibrillation ([place])   . CHF (congestive heart failure) ([place])   . Depression   . Diabetes mellitus without complication ([place])   . 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 Hypertension   . Morbid obesity ([place])   . Osteoporosis   . Urinary incontinence   . UTI (lower urinary tract infection) 01/2016   Cipro for Klebsiella pneumoniae isolate  . Wheelchair bound    bedbound    Patient Active Problem List   Diagnosis Date Noted  . Thigh ulcer, right, limited to breakdown of skin ([place]) 07/07/2019  . Skin ulcer of left thigh, limited to breakdown of skin ([place]) 07/07/2019  . COVID-19 virus infection 06/29/2019  . Urinary tract infection due to extended-spectrum beta lactamase (ESBL)-producing Klebsiella 06/25/2019  . Anemia 06/25/2019  . Pneumonia due to COVID-19 virus 06/21/2019  . Hypercoagulable state associated with COVID-19 ([place]) 06/21/2019  . Hypocalcemia 06/21/2019  . Vitamin D deficiency 06/21/2019  . Acute renal failure superimposed on stage 3 chronic kidney disease ([place]) 06/11/2019  . Hypomagnesemia 06/11/2019  . Hyperkalemia 06/11/2019  . Chronic constipation 06/11/2019  . Coronavirus infection 06/11/2019  . Acute delirium 06/03/2019  . Hypertension associated with type 2 diabetes mellitus ([place]) 05/31/2019  . GERD without esophagitis 05/31/2019  . CKD stage 3 due to type 2 diabetes mellitus ([place])  05/31/2019  . Anemia due to stage 3a chronic kidney disease 05/31/2019  . Protein-calorie malnutrition, severe ([place]) 05/31/2019  . Acute encephalopathy 05/25/2019  . Atrial fibrillation with tachycardic ventricular rate ([place]) 04/28/2019  . Uncontrolled type 2 diabetes mellitus with chronic kidney disease, with long-term current use of insulin ([place]) 04/22/2019  . Pyoderma gangrenosum 03/11/2019  . Wound infection 03/09/2019  . Chronic wound infection of abdomen   . Wheelchair bound   . Acute pulmonary edema ([place]) 02/17/2019  . Iron deficiency anemia 02/01/2019  . Abdominal wall ulcer, with fat layer exposed ([place]) 01/16/2019  . Elevated troponin   . Nausea and vomiting   . Symptomatic bradycardia 01/10/2019  . DKA (diabetic ketoacidoses) ([place]) 01/10/2019  . Urinary incontinence 12/28/2018  . Recurrent urinary tract infection 12/16/2018  . Wound, open, anterior abdominal wall 12/05/2018  . Hypotension due to hypovolemia   . Gastritis due to nonsteroidal anti-inflammatory drug   . Esophageal dysphagia   . GI bleed 09/03/2018  . Coagulopathy ([place])   . Colitis   . Lower abdominal pain   . Rectal bleeding   . Dehydration 08/16/2018  . Acute on chronic diastolic CHF (congestive heart failure) ([place]) 08/10/2018  . Acute lower UTI 08/10/2018  . Hypokalemia 08/10/2018  . COPD (chronic obstructive pulmonary disease) ([place]) 08/10/2018  . Thyroid lesion 08/10/2018  . Atrial fibrillation ([place]) 04/15/2018  . Chronic diastolic HF (heart failure) ([place]) 04/15/2018  . CKD (chronic kidney disease), stage III 04/15/2018  . Left tibial fracture 04/14/2018  . CHF exacerbation ([place]) 04/10/2018  .  SOB (shortness of breath)   . Acute CHF (congestive heart failure) ([place]) 04/01/2018  . Atrial fibrillation with RVR ([place]) 04/01/2018  . [place] 03/11/2016  . Muscle spasms of lower extremity 03/04/2016  . Acute renal insufficiency 02/26/2016  . History of MRSA infection 02/26/2016  . HTN (hypertension)  02/19/2016  . Fall 02/19/2016  . Depression 02/19/2016    Past Surgical History:  Procedure Laterality Date  . ABDOMINAL HYSTERECTOMY    . APPLICATION OF WOUND VAC  03/09/2019   Procedure: APPLICATION OF WOUND VAC;  Surgeon: Virl Cagey, MD;  Location: [facility] ORS;  Service: General;;  . BIOPSY  09/06/2018   Procedure: BIOPSY;  Surgeon: Danie Binder, MD;  Location: [facility] ENDO SUITE;  Service: Endoscopy;;  gastric bx's  . CESAREAN SECTION    . CHOLECYSTECTOMY    . ESOPHAGEAL DILATION  09/06/2018   Procedure: ESOPHAGEAL DILATION;  Surgeon: Danie Binder, MD;  Location: [facility];  Service: Endoscopy;;  . ESOPHAGOGASTRODUODENOSCOPY (EGD) WITH PROPOFOL N/A 09/06/2018   Procedure: ESOPHAGOGASTRODUODENOSCOPY (EGD) WITH PROPOFOL;  Surgeon: Danie Binder, MD;  Location: [facility];  Service: Endoscopy;  Laterality: N/A;  dilatation  . FEMUR IM NAIL Left 02/20/2016  . FEMUR IM NAIL Left 02/20/2016   Procedure: INTRAMEDULLARY (IM) RETROGRADE FEMORAL NAILING;  Surgeon: Leandrew Koyanagi, MD;  Location: [place];  Service: Orthopedics;  Laterality: Left;  . PANCREAS SURGERY  1967   1 cyst excised and one cyst drained  . TEE WITHOUT CARDIOVERSION N/A 04/05/2018   Procedure: TRANSESOPHAGEAL ECHOCARDIOGRAM (TEE);  Surgeon: Dorothy Spark, MD;  Location: [place];  Service: Cardiovascular;  Laterality: N/A;  . [place]    . WOUND DEBRIDEMENT N/A 03/09/2019   Procedure: EXCISIONAL DEBRIDEMENT OF ABDOMINAL WOUND ULCERS;  Surgeon: Virl Cagey, MD;  Location: [facility];  Service: General;  Laterality: N/A;  . WRIST FRACTURE SURGERY       OB History   No obstetric history on file.      Home Medications    Prior to Admission medications   Medication Sig Start Date End Date Taking? Authorizing Provider  acetaminophen (TYLENOL) 325 MG tablet Take 650 mg by mouth every 6 (six) hours as needed.     [provider]  albuterol (VENTOLIN HFA) 108 (90 Base) MCG/ACT  inhaler Inhale 2 puffs into the lungs every 6 (six) hours as needed for wheezing or shortness of breath. 07/05/19   Gerlene Fee, NP  Amino Acids-Protein Hydrolys (FEEDING SUPPLEMENT, PRO-STAT SUGAR FREE 64,) LIQD Take 30 mLs by mouth 3 (three) times daily. 05/25/19   Orson Eva, MD  amoxicillin-clavulanate (AUGMENTIN) 875-125 MG tablet Take 1 tablet by mouth 2 (two) times daily for 14 days. 07/07/19 07/21/19  Virl Cagey, MD  apixaban (ELIQUIS) 5 MG TABS tablet Take 1 tablet (5 mg total) by mouth 2 (two) times daily. 07/05/19   Gerlene Fee, NP  Balsam [place]-Castor Oil [facility]) OINT Apply to medial left thigh and vaginal area every shift day, evening and night    [provider]  bisacodyl (DULCOLAX) 10 MG suppository Place 10 mg rectally as needed for moderate constipation.    [provider]  calcium carbonate (TUMS EX) 750 MG chewable tablet Chew 1 tablet by mouth 3 (three) times daily.     [provider]  diltiazem (DILACOR XR) 180 MG 24 hr capsule Take 1 capsule (180 mg total) by mouth daily. 07/05/19   Gerlene Fee, NP  ergocalciferol (VITAMIN D2) 1.25 MG (50000 UT) capsule Take 1 capsule (50,000 Units total) by mouth once a week. Wednesdays 07/05/19   Gerlene Fee, NP  feeding supplement, ENSURE ENLIVE, (ENSURE ENLIVE) LIQD Take 237 mLs by mouth 3 (three) times daily between meals. 06/17/19   Ghimire, Henreitta Leber, MD  ferrous sulfate 325 (65 FE) MG tablet Take 1 tablet (325 mg total) by mouth daily with breakfast. 07/05/19   Gerlene Fee, NP  fluticasone (FLONASE) 50 MCG/ACT nasal spray Place 2 sprays into both nostrils daily as needed for allergies or rhinitis (congestion).     [provider]  folic acid (FOLVITE) 1 MG tablet Take 1 tablet (1 mg total) by mouth daily. 07/05/19   Gerlene Fee, NP  glucagon (GLUCAGEN HYPOKIT) 1 MG SOLR injection Inject 1 mg into the vein as needed for low blood sugar.    [provider]   insulin aspart (NOVOLOG FLEXPEN) 100 UNIT/ML FlexPen Inject 3 Units into the skin 3 (three) times daily with meals. For cbg >= 200 hold if <50% of meal intake 07/05/19   Gerlene Fee, NP  levalbuterol (XOPENEX) 0.63 MG/3ML nebulizer solution Take 3 mLs (0.63 mg total) by nebulization every 6 (six) hours as needed for wheezing or shortness of breath. 07/05/19   Gerlene Fee, NP  metoprolol tartrate (LOPRESSOR) 50 MG tablet Take 1 tablet (50 mg total) by mouth 2 (two) times daily. 07/05/19   Gerlene Fee, NP  Multiple Vitamin (MULTIVITAMIN WITH MINERALS) TABS tablet Take 1 tablet by mouth daily. 06/18/19   Ghimire, Henreitta Leber, MD  NON FORMULARY Diet:Regular    [provider]  NON FORMULARY Daily Weight Special Instructions: Notify MD for 3 lb weight gain in 1-2 days or 2 lbs overnight Once A Day 07:15 AM - 03:15 PM    [provider]  NON FORMULARY Magic Cup TID with meals    [provider]  ondansetron (ZOFRAN) 4 MG tablet Take 1 tablet (4 mg total) by mouth every 6 (six) hours as needed for nausea. 07/05/19   Gerlene Fee, NP  pantoprazole (PROTONIX) 40 MG tablet Take 1 tablet (40 mg total) by mouth daily. 07/05/19   Gerlene Fee, NP  polyethylene glycol (MIRALAX / GLYCOLAX) 17 g packet Take 17 g by mouth daily as needed for mild constipation. 06/17/19   Ghimire, Henreitta Leber, MD  predniSONE (DELTASONE) 10 MG tablet Take 1 tablet (10 mg total) by mouth daily with breakfast. Take 40 mg daily for 2 days then 30 mg for one day then 20 mg for one day then 10 mg one time then stop 07-21-19   Gerlene Fee, NP  silver nitrate applicators 80-32 % applicator Apply topically as directed to abdominal wound as needed for excessive bleeding. As Needed    [provider]  SPIRIVA RESPIMAT 1.25 MCG/ACT AERS Inhale 1 puff into the lungs daily. 07/05/19   Gerlene Fee, NP  Wound Dressings (ALLEVYN ADHESIVE) PADS Apply topically See admin instructions.  Apply to left ischium and change daily and PRN    [provider]    Family History Family History  Problem Relation Age of Onset  . Diabetes Mother        died in her 57's of a stroke  . Cancer Mother   . Stroke Mother   . Breast cancer Mother   . Diabetes Father        died in his 15's of a  stroke.  . Stroke Father   . Diabetes Brother        died @ 59 of a stroke.  . Stroke Brother   . Diabetes Maternal Grandmother   . Diabetes Maternal Grandfather   . Diabetes Paternal Grandmother   . Diabetes Paternal Grandfather   . Breast cancer Maternal Aunt     Social History Social History   Tobacco Use  . Smoking status: Never Smoker  . Smokeless tobacco: Never Used  Substance Use Topics  . Alcohol use: No  . Drug use: No     Allergies   Feraheme [ferumoxytol], Propranolol, Topamax [topiramate], and Latex   Review of Systems Review of Systems  Unable to perform ROS: Acuity of condition     Physical Exam Updated Vital Signs BP (!) 77/43   Pulse 75   Resp (!) 0   Ht 5' 4"  (1.626 m)   Wt 87.5 kg   SpO2 (!) 72%   BMI 33.13 kg/m   Physical Exam Vitals signs and nursing note reviewed.  Constitutional:      General: She is in acute distress.     Appearance: She is well-developed. She is obese. She is ill-appearing and toxic-appearing.  HENT:     Head: Normocephalic and atraumatic.     Right Ear: External ear normal.     Left Ear: External ear normal.     Nose: No congestion.     Mouth/Throat:     Pharynx: No oropharyngeal exudate or posterior oropharyngeal erythema.  Eyes:     Conjunctiva/sclera: Conjunctivae normal.     Pupils: Pupils are equal, round, and reactive to light.  Neck:     Musculoskeletal: Neck supple. No neck rigidity.     Trachea: Phonation normal.  Cardiovascular:     Rate and Rhythm: Normal rate and regular rhythm.     Heart sounds: Normal heart sounds.  Pulmonary:     Breath sounds: No stridor.     Comments: Bradypnea,  decreased effort.  Hypoxic despite 100% facemask oxygen. Abdominal:     General: There is no distension.     Palpations: Abdomen is soft.  Musculoskeletal:        General: Swelling present.     Comments: Marked edema, arms and legs bilaterally.  Scattered bruising arms and legs, nonspecific pattern.  Lymphadenopathy:     Cervical: No cervical adenopathy.  Skin:    General: Skin is warm.     Comments: Large wounds, healing, right lower abdomen panniculus region, right groin above the inguinal crease.  Neurological:     Mental Status: She is unresponsive.     GCS: GCS eye subscore is 1. GCS verbal subscore is 1. GCS motor subscore is 1.  Psychiatric:     Comments: Obtunded      ED Treatments / Results  Labs (all labs ordered are listed, but only abnormal results are displayed) Labs Reviewed  BLOOD GAS, ARTERIAL - Abnormal; Notable for the following components:      Result Value   pH, Arterial 7.155 (*)    pCO2 arterial 23.0 (*)    pO2, Arterial 416 (*)    Bicarbonate 9.9 (*)    Acid-base deficit 19.4 (*)    All other components within normal limits  CBG MONITORING, ED  POC SARS CORONAVIRUS 2 AG -  ED  CBG MONITORING, ED    EKG None  Radiology Dg Chest Portable 1 View  Result Date: 2019-07-09 CLINICAL DATA:  Unresponsiveness, hypoglycemia EXAM: PORTABLE CHEST  1 VIEW COMPARISON:  06/14/2019 FINDINGS: Endotracheal tube is appropriately positioned terminating 2.6 cm superior to the carina. Patient is rotated. Grossly stable cardiomediastinal contours. There is vascular congestion with diffuse bilateral opacities most pronounced within the right upper lobe. No pleural effusion or pneumothorax. Chronic deformity of the proximal left humerus IMPRESSION: 1. Satisfactory endotracheal tube position. 2. Bilateral airspace opacities most pronounced within the right upper lobe. Findings may reflect multifocal pneumonia or pulmonary edema. Electronically Signed   By: Davina Poke  M.D.   On: 07-12-2019 18:22    Procedures .Critical Care Performed by: Daleen Bo, MD Authorized by: Daleen Bo, MD   Critical care provider statement:    Critical care time (minutes):  110   Critical care start time:  2019/07/12 5:00 AM   Critical care end time:  2019-07-12 7:30 PM   Critical care time was exclusive of:  Separately billable procedures and treating other patients   Critical care was time spent personally by me on the following activities:  Blood draw for specimens, development of treatment plan with patient or surrogate, discussions with consultants, evaluation of patient's response to treatment, examination of patient, obtaining history from patient or surrogate, ordering and performing treatments and interventions, ordering and review of laboratory studies, pulse oximetry, re-evaluation of patient's condition, review of old charts and ordering and review of radiographic studies   (including critical care time)  Medications Ordered in ED Medications  dextrose 50 % solution (  Given 07/12/19 1828)  LORazepam (ATIVAN) 2 MG/ML injection (1 mg  Given 12-Jul-2019 1830)  atropine injection 1 mg (1 mg Intravenous Given by Other July 12, 2019 1833)  EPINEPHrine (ADRENALIN) 1 mg (1 mg Intravenous Given Jul 12, 2019 1834)  EPINEPHrine (ADRENALIN) 1 mg (1 mg Intravenous Given by Other 07/12/2019 1839)     Initial Impression / Assessment and Plan / ED Course  I have reviewed the triage vital signs and the nursing notes.  Pertinent labs & imaging results that were available during my care of the patient were reviewed by me and considered in my medical decision making (see chart for details).  Clinical Course as of Jul 08 208  Sat Jul 09, 2019  0206 Abnormal, low pH, low PCO2, elevated PO2 and low bicarbonate.  Blood gas, arterial(!!) [EW]  0207 Post intubation, ET tube in adequate placement, nonspecific bilateral infiltrates.  DG Chest Portable 1 View [EW]  0207 I discussed the  case with Dr. Inda Merlin, who states that the patient's PCP will sign the death certificate.   [EW]    Clinical Course User Index [EW] Daleen Bo, MD        Patient Vitals for the past 24 hrs:  BP Pulse Resp SpO2 Height Weight  12-Jul-2019 1845 - - (!) 0 - - -  07-12-19 1844 - - (!) 0 - - -  July 12, 2019 1830 (!) 77/43 - - - - -  07-12-19 1820 (!) 57/43 - (!) 21 - - -  07-12-2019 1811 (!) 45/22 - (!) 21 - - -  Jul 12, 2019 1800 (!) 76/59 - (!) 21 - - -  2019/07/12 1730 (!) 105/59 - 17 - - -  07-12-19 1723 - - - - 5' 4"  (1.626 m) 87.5 kg  2019-07-12 1720 (!) 63/50 - (!) 26 - - -  July 12, 2019 1709 - - - - 5' 4"  (1.626 m) -  12-Jul-2019 1707 (!) 65/49 - 17 - - -  07-12-19 1706 - 75 (!) 25 (!) 72 % - -  12-Jul-2019 1704 - 68  20 (!) 76 % - -    On arrival patient prepared for intubation, due to bradypnea and periods of apnea.  Oxygen saturations on facemask oxygen, very low, 74.  Patient urgently intubated, by me using glide scope, single attempt.  Good air movement post intubation.  CO2 color change present.  IV infusions, 3 bilateral interosseous access points and proximal tibia.  Central line access attempted, right subclavian, and left groin, unsuccessful.  During central line access attempts, patient decompensated to sinus bradycardia, that did not improve, and became pulseless electronic activity.  Cardiopulmonary resuscitation undertaken, by ACLS protocol.  No response and efforts were terminated.  Medical Decision Making: Patient presenting from the field after unresponsiveness, with respiratory distress requiring emergent intubation.  Vascular access very limited, and unable to be obtained.  Patient skin mottled on arrival and improved somewhat with efforts of resuscitation.  Moderate likelihood of PE as etiology of decompensation.  CRITICAL CARE-yes Performed by: Daleen Bo  Nursing Notes Reviewed/ Care Coordinated Applicable Imaging Reviewed Interpretation of Laboratory Data incorporated into ED  treatment  [place]  Final Clinical Impressions(s) / ED Diagnoses   Final diagnoses:  Respiratory arrest [facility])  Cardiac arrest [facility])    ED Discharge Orders    None       Daleen Bo, MD 07/09/19 0211

## 2019-07-19 NOTE — ED Notes (Signed)
Pulses not present per EDP. Compressions restarted.

## 2019-07-19 NOTE — ED Notes (Addendum)
Pt intubated by Dr. Eulis Foster with 7.5 ET tube. 23cm at the lip. Kim, RT at bedside assisting.

## 2019-07-19 NOTE — ED Notes (Addendum)
Dr. Constance Haw gave order to stop compressions per husband's request via telephone. Compressions stopped. Time of death [place] per Dr. Eulis Foster.

## 2019-07-19 NOTE — ED Notes (Signed)
RT attempted ABG, unsuccessful.

## 2019-07-19 NOTE — ED Notes (Signed)
O2 sat probe unable to be read. RN has tried earlobe, forehead, bilateral hands. Skin very cold and capillary refill > 3 seconds. RT obtaining ABG to determine O2 saturation.

## 2019-07-19 NOTE — Progress Notes (Signed)
[facility]  Came to ED to check to see if they needed me before leaving. Dr. Eulis Foster in with [name] attempting a central line due to patient coming in unresponsive from home. She did leave the [facility] yesterday.  She is intubated now. Tried to help Dr. Eulis Foster get a femoral CVL but this was unsuccessful. During this time patient became bradycardic, atropine given, and then ultimately lost her pulses.  CPR was started. I called her husband [name], and talked about CPR, talked about patient's health in the last few months, and talked about current prognosis given the acidosis and cardiac / respiratory failure.  John opted to make the patient comfort and asked [place] to stop CPR.    He will come to see her in the ED. Condolences given.   Curlene Labrum, MD [facility] [address] [place], [place] [address] [phone]/ [phone] (office)

## 2019-07-19 NOTE — ED Notes (Signed)
Referral called and pt is not suitable for any organ donation per Maryjean Morn.  Referral number [id number]

## 2019-07-19 NOTE — ED Notes (Signed)
EDP attempted central line x 2, unsuccessful at this time.

## 2019-07-19 NOTE — ED Notes (Signed)
Reported that pt's PCP Dr. [name] will sign pt's death certificate.

## 2019-07-19 NOTE — ED Notes (Signed)
Ativan 20m given IV per VO by Dr. WEulis Fosterdue to pt movement and to protect airway to prevent pt from pulling tube out.

## 2019-07-19 DEATH — deceased

## 2019-07-21 ENCOUNTER — Ambulatory Visit: Payer: BC Managed Care – PPO | Admitting: General Surgery

## 2019-08-04 ENCOUNTER — Ambulatory Visit: Payer: BC Managed Care – PPO | Admitting: Cardiovascular Disease

## 2020-04-08 IMAGING — DX DG CHEST 1V PORT
1 series · 1 of 1 positions shown · non-contrast
Comparison: 06/14/2019

CLINICAL DATA: Unresponsiveness, hypoglycemia

EXAM:
PORTABLE CHEST 1 VIEW

[chest ap]
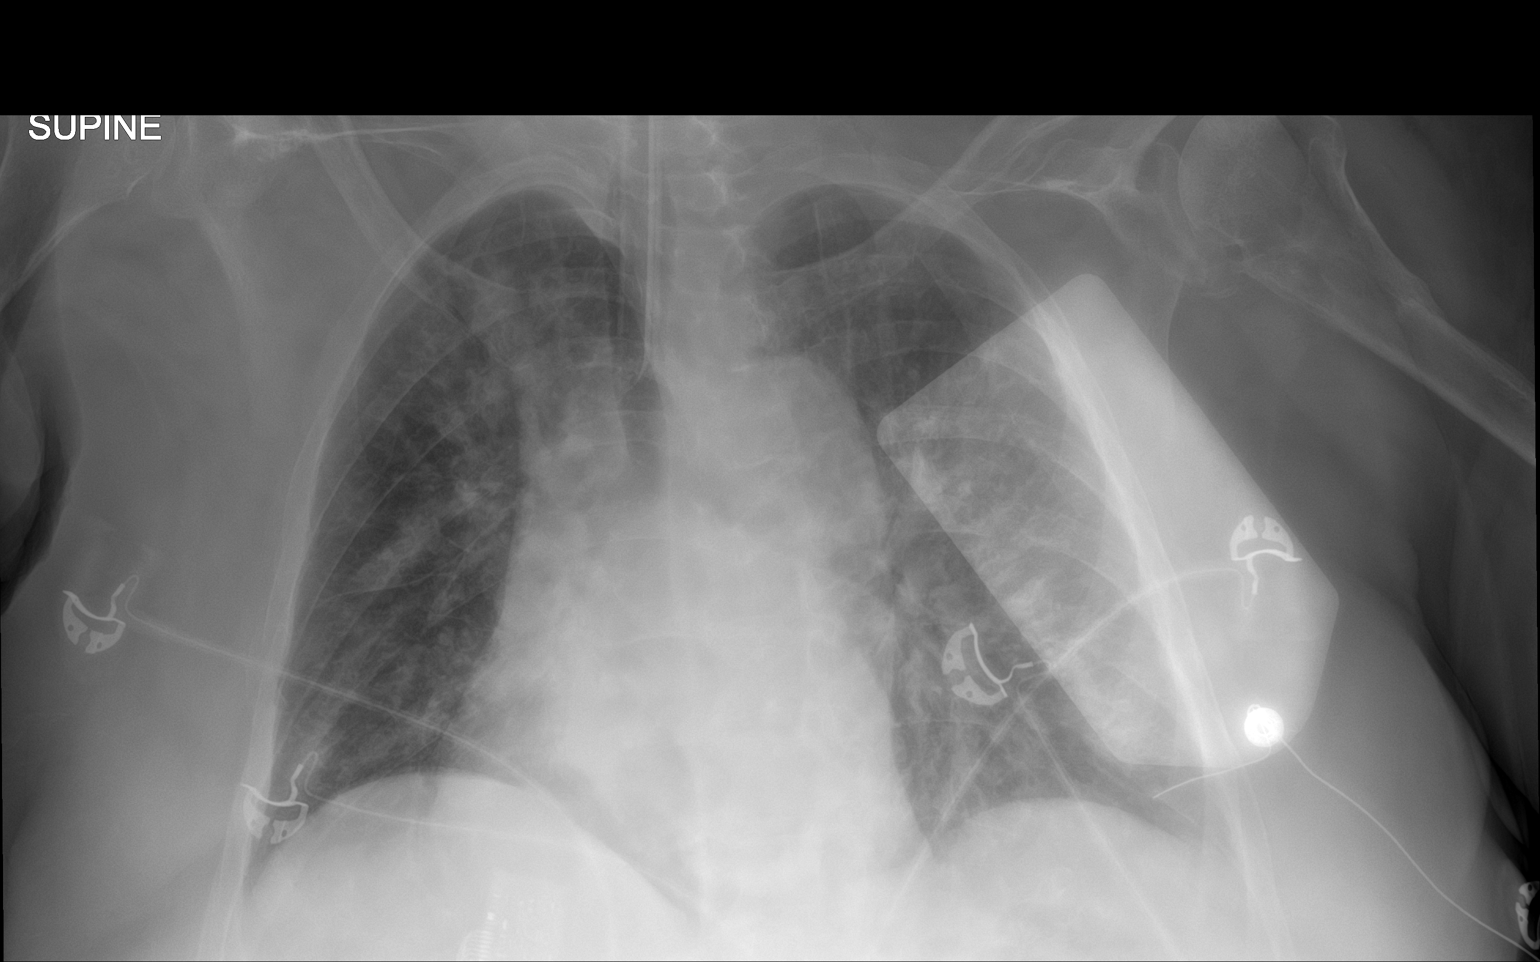

[1 of 1 positions shown; findings below may reference images not displayed]

FINDINGS: Endotracheal tube is appropriately positioned terminating 2.6 cm
superior to the carina. Patient is rotated. Grossly stable
cardiomediastinal contours. There is vascular congestion with
diffuse bilateral opacities most pronounced within the right upper
lobe. No pleural effusion or pneumothorax. Chronic deformity of the
proximal left humerus
IMPRESSION: 1. Satisfactory endotracheal tube position.
2. Bilateral airspace opacities most pronounced within the right
upper lobe. Findings may reflect multifocal pneumonia or pulmonary
edema.
